# Patient Record
Sex: Male | Born: 1940 | Race: Black or African American | Hispanic: No | Smoking: Former smoker
Health system: Southern US, Community
[De-identification: ages and names within clinical notes are randomized; demographics above are authoritative.]

## PROBLEM LIST (undated history)

## (undated) ENCOUNTER — Emergency Department (HOSPITAL_COMMUNITY): Discharge status: Against Medical Advice | Payer: Medicare Other

## (undated) DIAGNOSIS — I350 Nonrheumatic aortic (valve) stenosis: Secondary | ICD-10-CM

## (undated) DIAGNOSIS — I10 Essential (primary) hypertension: Secondary | ICD-10-CM

## (undated) DIAGNOSIS — E785 Hyperlipidemia, unspecified: Secondary | ICD-10-CM

## (undated) DIAGNOSIS — N400 Enlarged prostate without lower urinary tract symptoms: Secondary | ICD-10-CM

## (undated) DIAGNOSIS — Z5189 Encounter for other specified aftercare: Secondary | ICD-10-CM

## (undated) DIAGNOSIS — I4891 Unspecified atrial fibrillation: Secondary | ICD-10-CM

## (undated) DIAGNOSIS — E119 Type 2 diabetes mellitus without complications: Secondary | ICD-10-CM

## (undated) DIAGNOSIS — I509 Heart failure, unspecified: Secondary | ICD-10-CM

## (undated) DIAGNOSIS — D649 Anemia, unspecified: Secondary | ICD-10-CM

## (undated) DIAGNOSIS — S31000A Unspecified open wound of lower back and pelvis without penetration into retroperitoneum, initial encounter: Secondary | ICD-10-CM

## (undated) DIAGNOSIS — IMO0001 Reserved for inherently not codable concepts without codable children: Secondary | ICD-10-CM

## (undated) DIAGNOSIS — R197 Diarrhea, unspecified: Secondary | ICD-10-CM

## (undated) DIAGNOSIS — Z992 Dependence on renal dialysis: Secondary | ICD-10-CM

## (undated) DIAGNOSIS — Z8719 Personal history of other diseases of the digestive system: Secondary | ICD-10-CM

## (undated) DIAGNOSIS — N2581 Secondary hyperparathyroidism of renal origin: Secondary | ICD-10-CM

## (undated) DIAGNOSIS — G4733 Obstructive sleep apnea (adult) (pediatric): Secondary | ICD-10-CM

## (undated) DIAGNOSIS — N186 End stage renal disease: Secondary | ICD-10-CM

## (undated) HISTORY — PX: OTHER SURGICAL HISTORY: SHX169

## (undated) HISTORY — PX: CHOLECYSTECTOMY: SHX55

## (undated) HISTORY — PX: EYE SURGERY: SHX253

## (undated) HISTORY — DX: Type 2 diabetes mellitus without complications: E11.9

## (undated) HISTORY — DX: Obstructive sleep apnea (adult) (pediatric): G47.33

## (undated) HISTORY — DX: Hyperlipidemia, unspecified: E78.5

## (undated) HISTORY — DX: Heart failure, unspecified: I50.9

## (undated) HISTORY — DX: Essential (primary) hypertension: I10

---

## 2003-06-15 ENCOUNTER — Ambulatory Visit (HOSPITAL_BASED_OUTPATIENT_CLINIC_OR_DEPARTMENT_OTHER): Admission: RE | Admit: 2003-06-15 | Discharge: 2003-06-15 | Payer: Self-pay | Admitting: Endocrinology

## 2004-07-09 ENCOUNTER — Inpatient Hospital Stay (HOSPITAL_COMMUNITY): Admission: EM | Admit: 2004-07-09 | Discharge: 2004-07-16 | Payer: Self-pay | Admitting: Emergency Medicine

## 2004-07-09 ENCOUNTER — Encounter: Payer: Self-pay | Admitting: Cardiology

## 2004-07-09 ENCOUNTER — Ambulatory Visit (HOSPITAL_COMMUNITY): Admission: RE | Admit: 2004-07-09 | Discharge: 2004-07-09 | Payer: Self-pay | Admitting: Endocrinology

## 2004-11-07 ENCOUNTER — Ambulatory Visit: Payer: Self-pay | Admitting: Cardiology

## 2004-11-07 ENCOUNTER — Inpatient Hospital Stay (HOSPITAL_COMMUNITY): Admission: AD | Admit: 2004-11-07 | Discharge: 2004-11-10 | Payer: Self-pay | Admitting: Endocrinology

## 2004-11-25 ENCOUNTER — Ambulatory Visit: Payer: Self-pay | Admitting: Cardiology

## 2005-02-19 ENCOUNTER — Ambulatory Visit: Payer: Self-pay | Admitting: Internal Medicine

## 2005-06-15 ENCOUNTER — Ambulatory Visit: Payer: Self-pay | Admitting: Cardiology

## 2005-06-16 ENCOUNTER — Ambulatory Visit (HOSPITAL_COMMUNITY): Admission: RE | Admit: 2005-06-16 | Discharge: 2005-06-17 | Payer: Self-pay | Admitting: Ophthalmology

## 2006-01-04 ENCOUNTER — Ambulatory Visit: Payer: Self-pay | Admitting: Cardiology

## 2006-01-13 ENCOUNTER — Encounter: Payer: Self-pay | Admitting: Cardiovascular Disease

## 2006-01-13 ENCOUNTER — Ambulatory Visit: Payer: Self-pay

## 2006-03-25 ENCOUNTER — Ambulatory Visit (HOSPITAL_COMMUNITY): Admission: RE | Admit: 2006-03-25 | Discharge: 2006-03-26 | Payer: Self-pay | Admitting: Ophthalmology

## 2006-12-02 ENCOUNTER — Ambulatory Visit (HOSPITAL_COMMUNITY): Admission: RE | Admit: 2006-12-02 | Discharge: 2006-12-03 | Payer: Self-pay | Admitting: Ophthalmology

## 2006-12-02 ENCOUNTER — Encounter (INDEPENDENT_AMBULATORY_CARE_PROVIDER_SITE_OTHER): Payer: Self-pay | Admitting: *Deleted

## 2008-01-25 ENCOUNTER — Ambulatory Visit: Payer: Self-pay | Admitting: Cardiology

## 2008-01-25 ENCOUNTER — Inpatient Hospital Stay (HOSPITAL_COMMUNITY): Admission: EM | Admit: 2008-01-25 | Discharge: 2008-02-01 | Payer: Self-pay | Admitting: Emergency Medicine

## 2008-01-26 ENCOUNTER — Encounter: Payer: Self-pay | Admitting: Cardiology

## 2008-01-26 ENCOUNTER — Ambulatory Visit: Payer: Self-pay | Admitting: Cardiovascular Disease

## 2008-01-30 ENCOUNTER — Encounter (INDEPENDENT_AMBULATORY_CARE_PROVIDER_SITE_OTHER): Payer: Self-pay | Admitting: Nephrology

## 2008-01-30 ENCOUNTER — Ambulatory Visit: Payer: Self-pay | Admitting: Vascular Surgery

## 2008-01-31 ENCOUNTER — Ambulatory Visit: Payer: Self-pay | Admitting: Vascular Surgery

## 2008-02-08 ENCOUNTER — Ambulatory Visit: Payer: Self-pay | Admitting: Cardiology

## 2008-03-05 ENCOUNTER — Ambulatory Visit: Payer: Self-pay | Admitting: Internal Medicine

## 2008-03-19 ENCOUNTER — Ambulatory Visit: Payer: Self-pay | Admitting: Surgery

## 2008-03-28 ENCOUNTER — Ambulatory Visit (HOSPITAL_COMMUNITY): Admission: RE | Admit: 2008-03-28 | Discharge: 2008-03-28 | Payer: Self-pay | Admitting: Surgery

## 2008-03-28 ENCOUNTER — Ambulatory Visit: Payer: Self-pay | Admitting: Surgery

## 2008-04-24 ENCOUNTER — Ambulatory Visit: Payer: Self-pay | Admitting: Cardiology

## 2008-05-10 ENCOUNTER — Ambulatory Visit: Payer: Self-pay | Admitting: Cardiology

## 2008-05-10 LAB — CONVERTED CEMR LAB
BUN: 110 mg/dL (ref 6–23)
Basophils Absolute: 0.2 10*3/uL — ABNORMAL HIGH (ref 0.0–0.1)
Basophils Relative: 2.1 % — ABNORMAL HIGH (ref 0.0–1.0)
CO2: 30 meq/L (ref 19–32)
Calcium: 9.3 mg/dL (ref 8.4–10.5)
Chloride: 92 meq/L — ABNORMAL LOW (ref 96–112)
Creatinine, Ser: 5.8 mg/dL (ref 0.4–1.5)
Eosinophils Absolute: 0.3 10*3/uL (ref 0.0–0.7)
Eosinophils Relative: 3.3 % (ref 0.0–5.0)
GFR calc Af Amer: 13 mL/min
GFR calc non Af Amer: 10 mL/min
Hemoglobin: 12.7 g/dL — ABNORMAL LOW (ref 13.0–17.0)
MCHC: 32.8 g/dL (ref 30.0–36.0)
MCV: 90.2 fL (ref 78.0–100.0)
Monocytes Relative: 9.4 % (ref 3.0–12.0)
Neutro Abs: 5.1 10*3/uL (ref 1.4–7.7)
Neutrophils Relative %: 64 % (ref 43.0–77.0)
Platelets: 336 10*3/uL (ref 150–400)
Potassium: 4.8 meq/L (ref 3.5–5.1)
RBC: 4.29 M/uL (ref 4.22–5.81)
Sodium: 138 meq/L (ref 135–145)
WBC: 8.1 10*3/uL (ref 4.5–10.5)

## 2008-06-05 ENCOUNTER — Ambulatory Visit: Payer: Self-pay | Admitting: Internal Medicine

## 2008-06-05 LAB — CONVERTED CEMR LAB
CO2: 32 meq/L (ref 19–32)
Calcium: 8.3 mg/dL — ABNORMAL LOW (ref 8.4–10.5)
Creatinine, Ser: 3.8 mg/dL — ABNORMAL HIGH (ref 0.4–1.5)
GFR calc Af Amer: 21 mL/min
GFR calc non Af Amer: 17 mL/min
Glucose, Bld: 235 mg/dL — ABNORMAL HIGH (ref 70–99)
Sodium: 143 meq/L (ref 135–145)

## 2008-06-21 ENCOUNTER — Ambulatory Visit: Payer: Self-pay | Admitting: Cardiology

## 2008-07-03 ENCOUNTER — Ambulatory Visit: Payer: Self-pay | Admitting: Cardiology

## 2008-07-03 LAB — CONVERTED CEMR LAB
CO2: 29 meq/L (ref 19–32)
Calcium: 8.8 mg/dL (ref 8.4–10.5)
Creatinine, Ser: 4.8 mg/dL (ref 0.4–1.5)
GFR calc Af Amer: 16 mL/min
GFR calc non Af Amer: 13 mL/min
Glucose, Bld: 198 mg/dL — ABNORMAL HIGH (ref 70–99)
Pro B Natriuretic peptide (BNP): 230 pg/mL — ABNORMAL HIGH (ref 0.0–100.0)
Sodium: 140 meq/L (ref 135–145)

## 2008-07-31 ENCOUNTER — Ambulatory Visit: Payer: Self-pay | Admitting: Cardiology

## 2008-07-31 LAB — CONVERTED CEMR LAB
BUN: 53 mg/dL — ABNORMAL HIGH (ref 6–23)
CO2: 25 meq/L (ref 19–32)
Calcium: 8.7 mg/dL (ref 8.4–10.5)
Creatinine, Ser: 3.8 mg/dL — ABNORMAL HIGH (ref 0.4–1.5)
GFR calc Af Amer: 21 mL/min
GFR calc non Af Amer: 17 mL/min
Glucose, Bld: 53 mg/dL — ABNORMAL LOW (ref 70–99)
Sodium: 145 meq/L (ref 135–145)

## 2008-08-17 ENCOUNTER — Encounter: Admission: RE | Admit: 2008-08-17 | Discharge: 2008-08-17 | Payer: Self-pay | Admitting: Nephrology

## 2008-08-29 ENCOUNTER — Ambulatory Visit (HOSPITAL_COMMUNITY): Admission: RE | Admit: 2008-08-29 | Discharge: 2008-08-29 | Payer: Self-pay | Admitting: Nephrology

## 2008-09-07 ENCOUNTER — Inpatient Hospital Stay (HOSPITAL_COMMUNITY): Admission: EM | Admit: 2008-09-07 | Discharge: 2008-09-25 | Payer: Self-pay | Admitting: Emergency Medicine

## 2008-09-07 ENCOUNTER — Ambulatory Visit: Payer: Self-pay | Admitting: Cardiology

## 2008-09-09 ENCOUNTER — Encounter (INDEPENDENT_AMBULATORY_CARE_PROVIDER_SITE_OTHER): Payer: Self-pay | Admitting: General Surgery

## 2008-10-11 ENCOUNTER — Encounter: Admission: RE | Admit: 2008-10-11 | Discharge: 2008-10-11 | Payer: Self-pay | Admitting: General Surgery

## 2008-10-18 ENCOUNTER — Ambulatory Visit: Payer: Self-pay | Admitting: Internal Medicine

## 2008-10-18 ENCOUNTER — Ambulatory Visit: Payer: Self-pay | Admitting: Pulmonary Disease

## 2008-10-18 ENCOUNTER — Inpatient Hospital Stay (HOSPITAL_COMMUNITY): Admission: EM | Admit: 2008-10-18 | Discharge: 2008-11-01 | Payer: Self-pay | Admitting: Emergency Medicine

## 2008-10-22 ENCOUNTER — Encounter (INDEPENDENT_AMBULATORY_CARE_PROVIDER_SITE_OTHER): Payer: Self-pay | Admitting: Internal Medicine

## 2008-12-12 ENCOUNTER — Ambulatory Visit: Payer: Self-pay | Admitting: Cardiology

## 2008-12-12 LAB — CONVERTED CEMR LAB
Alkaline Phosphatase: 99 units/L (ref 39–117)
Basophils Absolute: 0.1 10*3/uL (ref 0.0–0.1)
Bilirubin, Direct: 0.1 mg/dL (ref 0.0–0.3)
Eosinophils Absolute: 0.3 10*3/uL (ref 0.0–0.7)
Eosinophils Relative: 3.7 % (ref 0.0–5.0)
HCT: 41.1 % (ref 39.0–52.0)
MCHC: 34.4 g/dL (ref 30.0–36.0)
MCV: 89.5 fL (ref 78.0–100.0)
Monocytes Absolute: 1.1 10*3/uL — ABNORMAL HIGH (ref 0.1–1.0)
Neutrophils Relative %: 55.3 % (ref 43.0–77.0)
Platelets: 220 10*3/uL (ref 150–400)
RBC: 4.6 M/uL (ref 4.22–5.81)
RDW: 19.1 % — ABNORMAL HIGH (ref 11.5–14.6)
TSH: 1.05 microintl units/mL (ref 0.35–5.50)
Total Protein: 7.6 g/dL (ref 6.0–8.3)
WBC: 7.3 10*3/uL (ref 4.5–10.5)

## 2009-01-02 ENCOUNTER — Inpatient Hospital Stay (HOSPITAL_COMMUNITY): Admission: EM | Admit: 2009-01-02 | Discharge: 2009-01-05 | Payer: Self-pay | Admitting: Emergency Medicine

## 2009-01-02 ENCOUNTER — Encounter: Admission: RE | Admit: 2009-01-02 | Discharge: 2009-01-02 | Payer: Self-pay | Admitting: Nephrology

## 2009-01-03 ENCOUNTER — Encounter (INDEPENDENT_AMBULATORY_CARE_PROVIDER_SITE_OTHER): Payer: Self-pay | Admitting: Gastroenterology

## 2009-01-04 ENCOUNTER — Encounter (INDEPENDENT_AMBULATORY_CARE_PROVIDER_SITE_OTHER): Payer: Self-pay | Admitting: Gastroenterology

## 2009-01-11 ENCOUNTER — Ambulatory Visit (HOSPITAL_COMMUNITY): Admission: RE | Admit: 2009-01-11 | Discharge: 2009-01-11 | Payer: Self-pay | Admitting: Nephrology

## 2009-01-14 ENCOUNTER — Ambulatory Visit: Payer: Self-pay | Admitting: Cardiology

## 2009-01-30 ENCOUNTER — Ambulatory Visit: Payer: Self-pay | Admitting: Vascular Surgery

## 2009-02-01 ENCOUNTER — Ambulatory Visit (HOSPITAL_COMMUNITY): Admission: RE | Admit: 2009-02-01 | Discharge: 2009-02-01 | Payer: Self-pay | Admitting: Vascular Surgery

## 2009-02-01 ENCOUNTER — Ambulatory Visit: Payer: Self-pay | Admitting: Vascular Surgery

## 2009-04-15 ENCOUNTER — Ambulatory Visit (HOSPITAL_COMMUNITY): Admission: RE | Admit: 2009-04-15 | Discharge: 2009-04-15 | Payer: Self-pay | Admitting: Nephrology

## 2009-05-28 ENCOUNTER — Encounter: Payer: Self-pay | Admitting: *Deleted

## 2009-07-03 ENCOUNTER — Encounter: Payer: Self-pay | Admitting: *Deleted

## 2009-08-06 ENCOUNTER — Encounter: Payer: Self-pay | Admitting: Cardiology

## 2009-08-14 ENCOUNTER — Encounter (INDEPENDENT_AMBULATORY_CARE_PROVIDER_SITE_OTHER): Payer: Self-pay | Admitting: *Deleted

## 2009-08-22 ENCOUNTER — Telehealth (INDEPENDENT_AMBULATORY_CARE_PROVIDER_SITE_OTHER): Payer: Self-pay | Admitting: *Deleted

## 2009-09-25 ENCOUNTER — Encounter: Payer: Self-pay | Admitting: Cardiology

## 2009-09-26 ENCOUNTER — Encounter: Payer: Self-pay | Admitting: Cardiology

## 2009-10-10 ENCOUNTER — Ambulatory Visit (HOSPITAL_COMMUNITY): Admission: RE | Admit: 2009-10-10 | Discharge: 2009-10-10 | Payer: Self-pay | Admitting: Nephrology

## 2009-10-23 ENCOUNTER — Encounter: Payer: Self-pay | Admitting: Cardiology

## 2009-10-23 DIAGNOSIS — N186 End stage renal disease: Secondary | ICD-10-CM

## 2009-10-23 DIAGNOSIS — E1122 Type 2 diabetes mellitus with diabetic chronic kidney disease: Secondary | ICD-10-CM

## 2009-10-23 DIAGNOSIS — Z992 Dependence on renal dialysis: Secondary | ICD-10-CM

## 2009-10-23 DIAGNOSIS — I509 Heart failure, unspecified: Secondary | ICD-10-CM

## 2009-10-23 DIAGNOSIS — I480 Paroxysmal atrial fibrillation: Secondary | ICD-10-CM | POA: Insufficient documentation

## 2009-10-23 DIAGNOSIS — G4733 Obstructive sleep apnea (adult) (pediatric): Secondary | ICD-10-CM

## 2009-10-23 DIAGNOSIS — I120 Hypertensive chronic kidney disease with stage 5 chronic kidney disease or end stage renal disease: Secondary | ICD-10-CM

## 2009-10-24 ENCOUNTER — Ambulatory Visit: Payer: Self-pay | Admitting: Cardiology

## 2009-10-24 DIAGNOSIS — I10 Essential (primary) hypertension: Secondary | ICD-10-CM

## 2009-10-28 ENCOUNTER — Telehealth (INDEPENDENT_AMBULATORY_CARE_PROVIDER_SITE_OTHER): Payer: Self-pay | Admitting: *Deleted

## 2009-10-30 ENCOUNTER — Telehealth (INDEPENDENT_AMBULATORY_CARE_PROVIDER_SITE_OTHER): Payer: Self-pay

## 2009-10-31 ENCOUNTER — Encounter (HOSPITAL_COMMUNITY): Admission: RE | Admit: 2009-10-31 | Discharge: 2009-12-25 | Payer: Self-pay | Admitting: Cardiology

## 2009-10-31 ENCOUNTER — Ambulatory Visit: Payer: Self-pay | Admitting: Internal Medicine

## 2009-10-31 ENCOUNTER — Ambulatory Visit: Payer: Self-pay

## 2009-11-07 LAB — CONVERTED CEMR LAB
ALT: 26 units/L (ref 0–53)
Alkaline Phosphatase: 109 units/L (ref 39–117)
Bilirubin, Direct: 0 mg/dL (ref 0.0–0.3)
TSH: 0.99 microintl units/mL (ref 0.35–5.50)
Total Bilirubin: 1.7 mg/dL — ABNORMAL HIGH (ref 0.3–1.2)
Total Protein: 8.4 g/dL — ABNORMAL HIGH (ref 6.0–8.3)

## 2010-01-15 ENCOUNTER — Telehealth (INDEPENDENT_AMBULATORY_CARE_PROVIDER_SITE_OTHER): Payer: Self-pay | Admitting: *Deleted

## 2010-01-16 ENCOUNTER — Ambulatory Visit (HOSPITAL_COMMUNITY): Admission: RE | Admit: 2010-01-16 | Discharge: 2010-01-16 | Payer: Self-pay | Admitting: Nephrology

## 2010-04-26 IMAGING — CT CT PELVIS W/O CM
2 of 4 series · 17 of 46 positions shown, 19 images · non-contrast
Comparison: 09/15/2008

CT ABDOMEN

CLINICAL DATA: Biloma drain

CT ABDOMEN AND PELVIS WITHOUT CONTRAST
TECHNIQUE: Multidetector CT imaging of the abdomen and pelvis was
performed following the standard
protocol without intravenous contrast.

[Series 2: abd/pelv w/o 5.0 b31f st · axial · non-contrast · 0.79mm/px · z∈[-480,-56]mm · 14 of 93 slices shown, 16 images]
[im 4/93  soft-tissue]
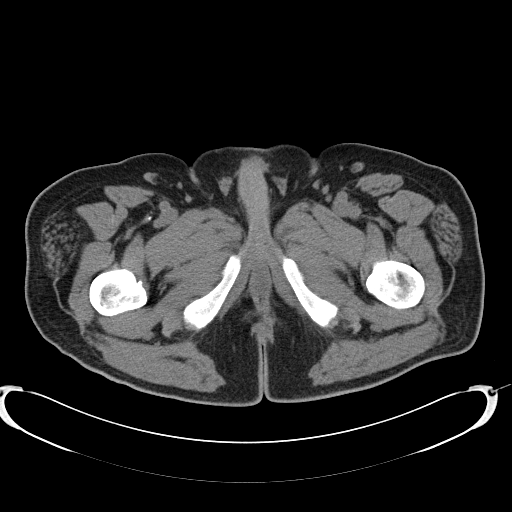
[im 4/93  bone]
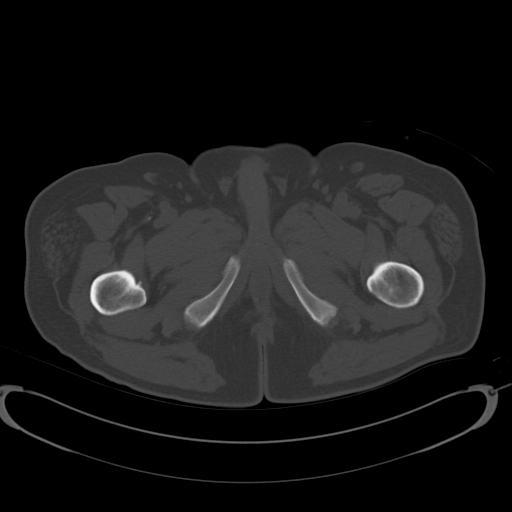
[im 12/93  soft-tissue]
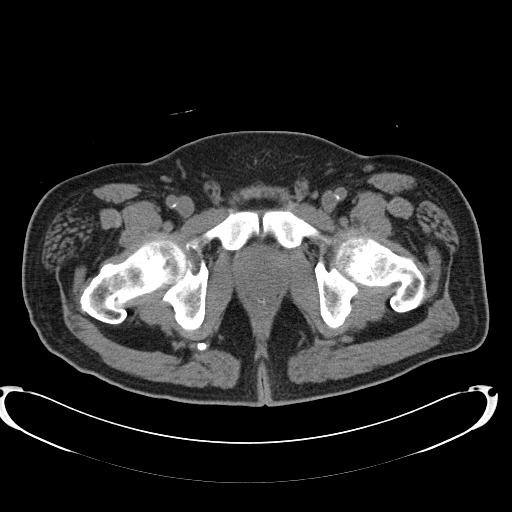
[im 19/93  soft-tissue]
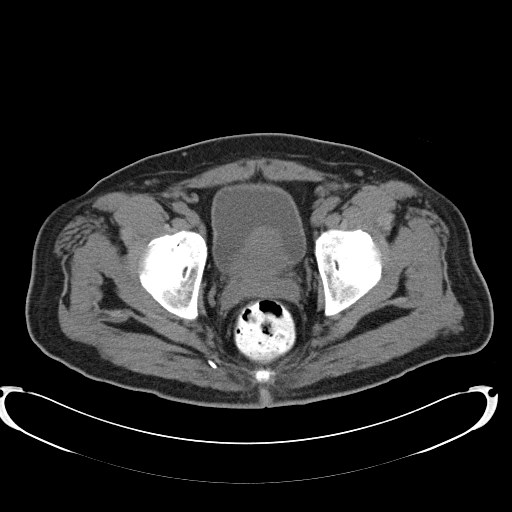
[im 26/93  soft-tissue]
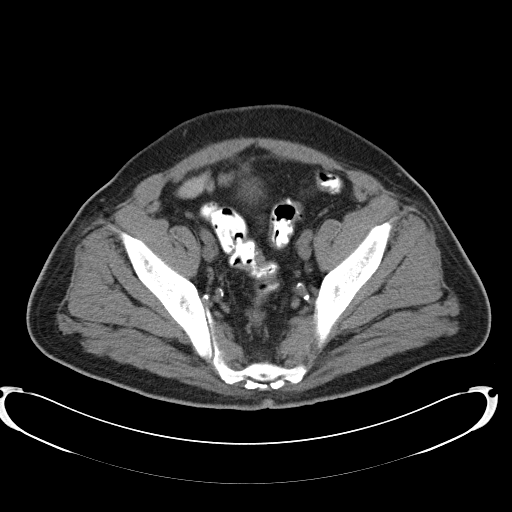
[im 30/93  soft-tissue]
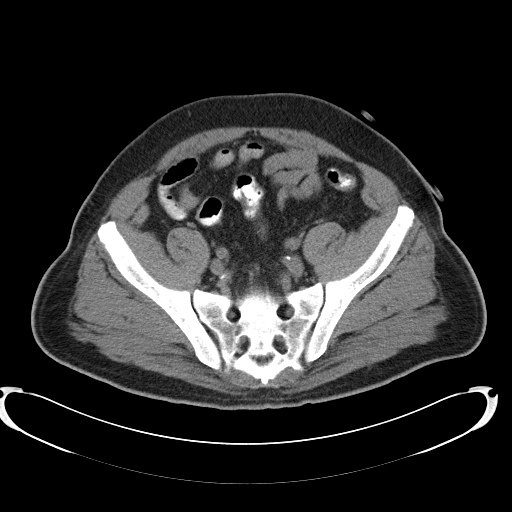
[im 37/93  soft-tissue]
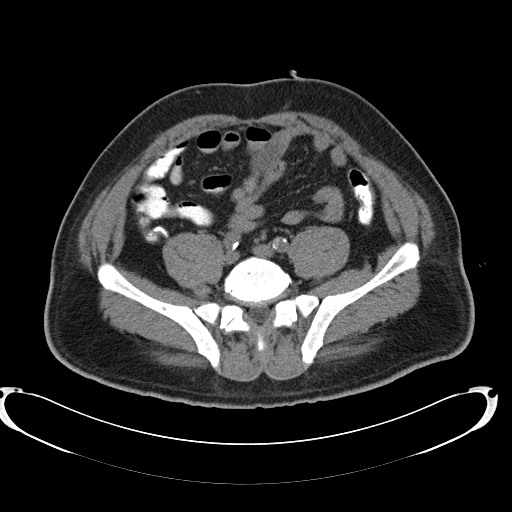
[im 45/93  soft-tissue]
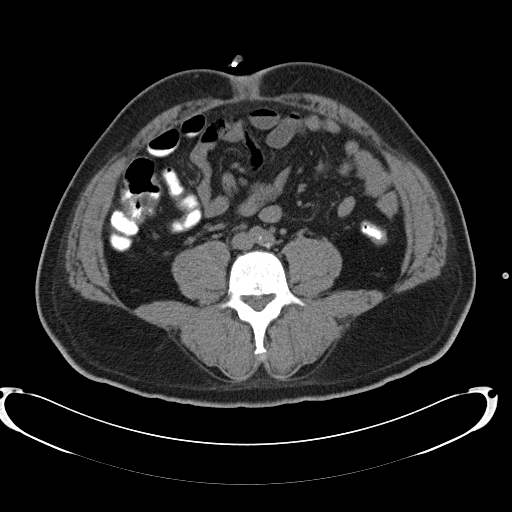
[im 48/93  soft-tissue]
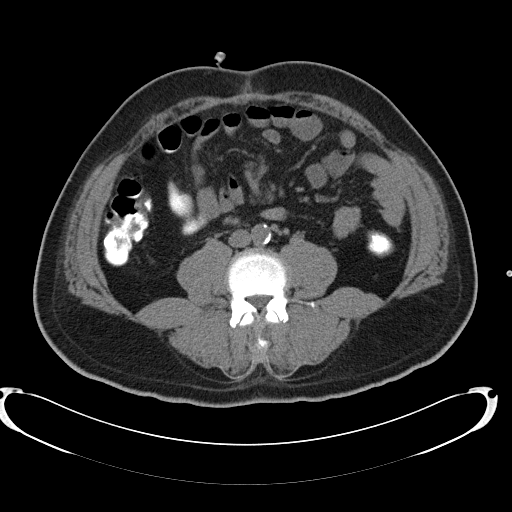
[im 56/93  soft-tissue]
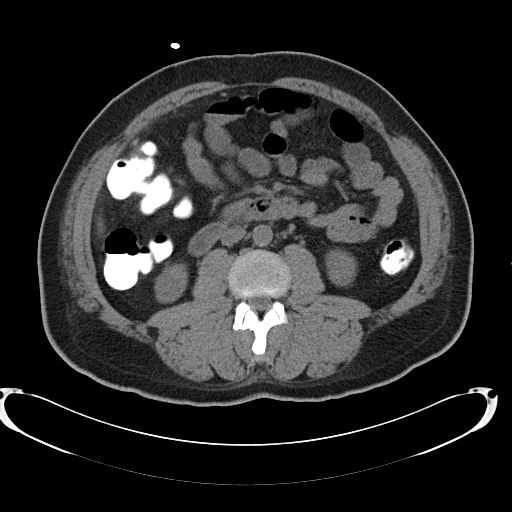
[im 56/93  bone]
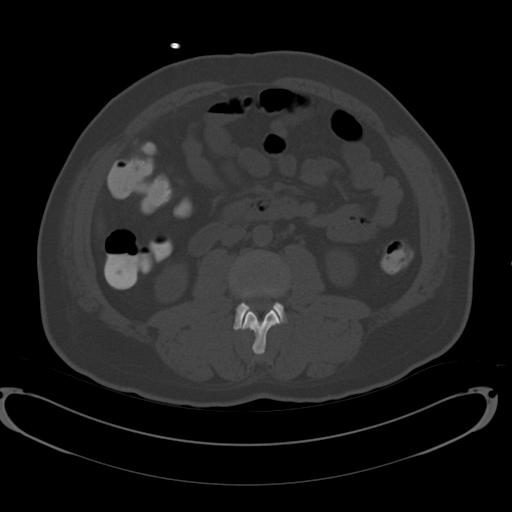
[im 63/93  soft-tissue]
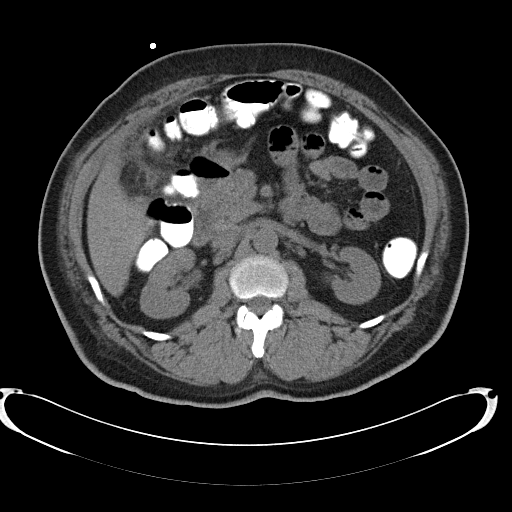
[im 70/93  soft-tissue]
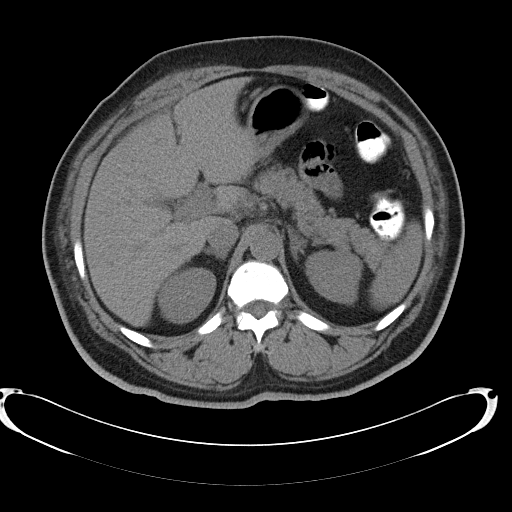
[im 74/93  soft-tissue]
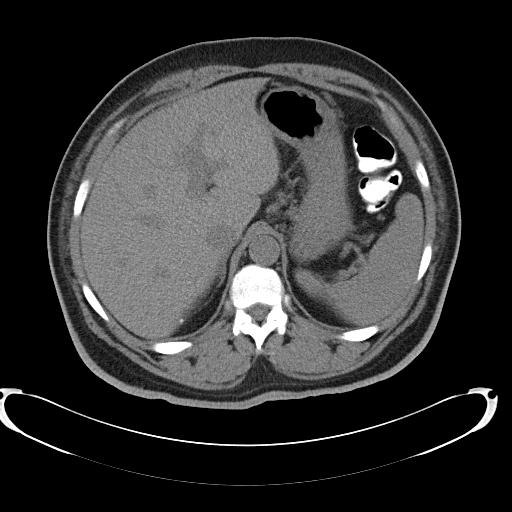
[im 81/93  soft-tissue]
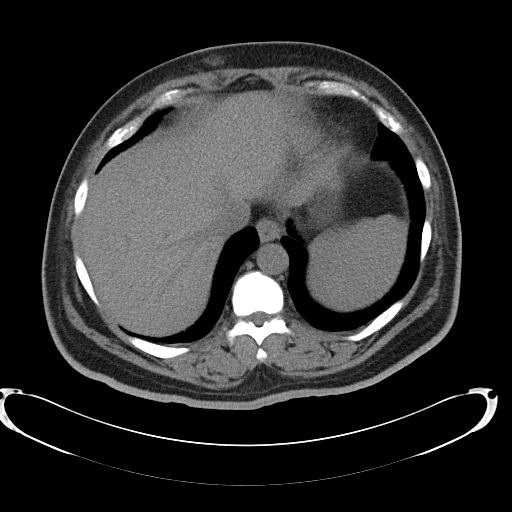
[im 89/93  soft-tissue]
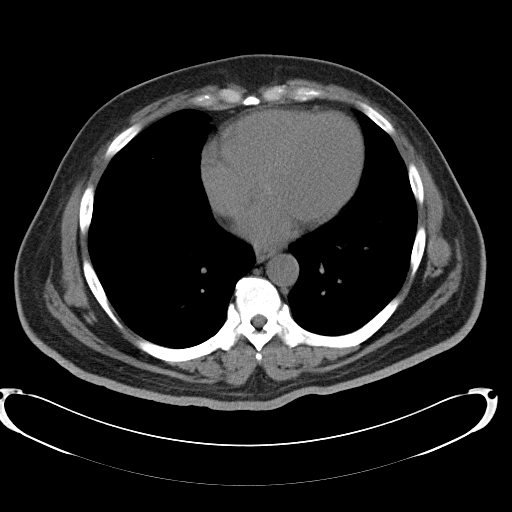

[Series 5: abd/pelv w/o 3.0 spo cor thins · coronal · non-contrast · 1.04mm/px · 3 of 77 slices shown]
[im 26/77  soft-tissue]
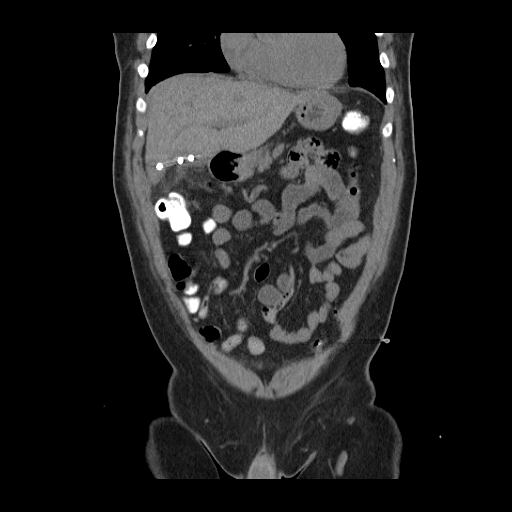
[im 34/77  soft-tissue]
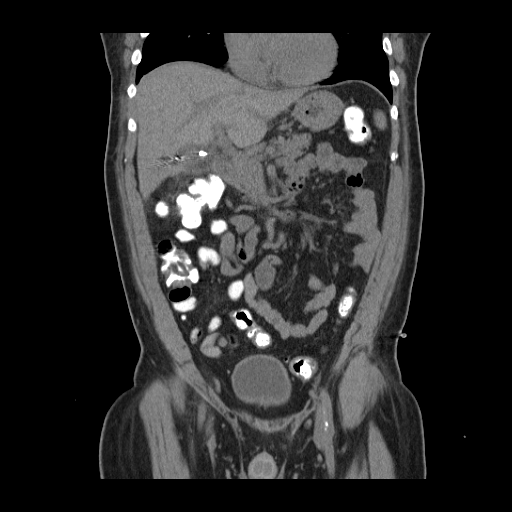
[im 43/77  soft-tissue]
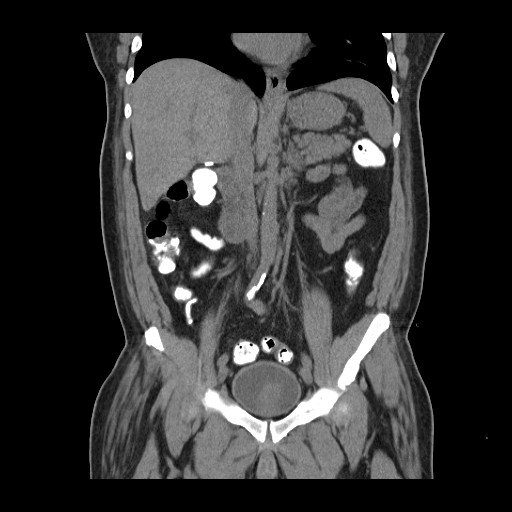

[17 of 46 positions shown; findings below may reference images not displayed]

FINDINGS: A pigtail drain is now present in the gallbladder fossa.
The fluid collection has completely resolved.  No new fluid
collection is present.

The kidneys, spleen, pancreas, adrenal glands are stable.  Negative
abnormal adenopathy.
IMPRESSION: Interval placement of a drain in the gallbladder fossa with
resolution of the fluid collection.

CT PELVIS
FINDINGS: The prostate remains enlarged compared to September 07, 2008.  The bladder is stable in appearance.  Negative free fluid.
Negative abnormal adenopathy.
IMPRESSION: Stable.

## 2010-10-09 ENCOUNTER — Ambulatory Visit (HOSPITAL_COMMUNITY): Admission: RE | Admit: 2010-10-09 | Discharge: 2010-10-09 | Payer: Self-pay | Admitting: Nephrology

## 2010-12-16 ENCOUNTER — Encounter
Admission: RE | Admit: 2010-12-16 | Discharge: 2010-12-16 | Payer: Self-pay | Source: Home / Self Care | Attending: Neurology | Admitting: Neurology

## 2011-01-17 ENCOUNTER — Encounter: Payer: Self-pay | Admitting: Nephrology

## 2011-01-18 ENCOUNTER — Encounter: Payer: Self-pay | Admitting: Neurology

## 2011-01-19 ENCOUNTER — Encounter: Payer: Self-pay | Admitting: Nephrology

## 2011-01-29 ENCOUNTER — Ambulatory Visit: Admit: 2011-01-29 | Payer: Self-pay | Attending: Vascular Surgery | Admitting: Vascular Surgery

## 2011-01-29 ENCOUNTER — Encounter (INDEPENDENT_AMBULATORY_CARE_PROVIDER_SITE_OTHER): Payer: Medicare Other | Admitting: Vascular Surgery

## 2011-01-29 ENCOUNTER — Other Ambulatory Visit (INDEPENDENT_AMBULATORY_CARE_PROVIDER_SITE_OTHER): Payer: Medicare Other

## 2011-01-29 DIAGNOSIS — R0989 Other specified symptoms and signs involving the circulatory and respiratory systems: Secondary | ICD-10-CM

## 2011-01-29 DIAGNOSIS — I6529 Occlusion and stenosis of unspecified carotid artery: Secondary | ICD-10-CM

## 2011-01-29 NOTE — Letter (Signed)
Summary: [facility] Transplant Evaluation   [facility] Transplant Evaluation   Imported By: Sallee Provencal 01/07/2010 11:36:47  _____________________________________________________________________  External Attachment:    Type:   Image     Comment:   External Document

## 2011-01-29 NOTE — Progress Notes (Signed)
   Phone Note Other Incoming   Caller: Beth Details for Reason: Pt.Information Initial call taken by: Conni Elliot lead over to [facility] Transplant Dept. to fax [phone] [facility]  January 15, 2010 10:17 AM

## 2011-02-06 NOTE — Procedures (Unsigned)
CAROTID DUPLEX EXAM  INDICATION:  Right bruit.  HISTORY: Diabetes:  Yes Cardiac:  Atrial fibrillation, diastolic dysfunction, CHF Hypertension:  Yes Smoking:  No Previous Surgery:  No CV History:  History of CVA Amaurosis Fugax No, Paresthesias No, Hemiparesis No                                      RIGHT             LEFT Brachial systolic pressure: Brachial Doppler waveforms: Vertebral direction of flow:        Antegrade         Antegrade DUPLEX VELOCITIES (cm/sec) CCA peak systolic                   69                65 ECA peak systolic                   101               69 ICA peak systolic                   59                89 ICA end diastolic                   12                28 PLAQUE MORPHOLOGY:                  Intimal wall thickening             Intimal wall thickening PLAQUE AMOUNT:                      Minimal           Minimal PLAQUE LOCATION:                    CCA               CCA, bifurcation  IMPRESSION: 1. Bilateral internal carotid arteries are within normal limits. 2. Antegrade vertebral arteries bilaterally.        ___________________________________________ Jessy Oto Oneida Alar, MD  EM/MEDQ  D:  01/29/2011  T:  01/29/2011  Job:  [id number]

## 2011-02-09 NOTE — Assessment & Plan Note (Signed)
OFFICE VISIT  [name], TOUPS Edward DOB:  1941/11/04                                       01/29/2011 [id number]  CHIEF COMPLAINT:  Carotid bruit.  HISTORY OF PRESENT ILLNESS:  The patient is a 70 year old male referred by Dr. Mercy Moore for evaluation of carotid bruit.  The patient apparently has had some memory loss in the last year or so although the patient states he was only confused once in the last year.  He apparently was noted to have a carotid bruit on recent office exam by Dr. Mercy Moore and was referred for further evaluation for possible carotid origin of his memory problems.  He denies prior symptoms of TIA, amaurosis or stroke.  CHRONIC MEDICAL PROBLEMS:  Include end-stage renal disease, hypertension, diabetes and elevated cholesterol.  These are all currently controlled and followed by Dr. Mercy Moore.  Of note, the patient also had a recent fall and complained today of some right foot and left calf pain but he states that overall this is improving.  He was using a cane up until yesterday but states he has been able to walk better today.  PAST MEDICAL HISTORY:  As listed above.  SOCIAL HISTORY:  He is single.  He has 3 children.  He is a nonsmoker and quit in 2002.  He consumes 3 to 4 beers daily.  FAMILY HISTORY:  Remarkable for his son who has diabetes and hypertension.  REVIEW OF SYSTEMS:  Full 12 point review of systems was performed with the patient today.  These were all negative.  MEDICATIONS:  Include Ambien, metoprolol, Lipitor, PhosLo, amiodarone, Prandin, Nephro-Vite and aspirin 81 mg once a day.  ALLERGIES:  He has no known drug allergies.  PHYSICAL EXAM:  Vital signs:  Blood pressure is 148/81 in the right arm, heart rate 69 and regular.  HEENT:  Unremarkable.  Neck:  Has 2+ carotid pulses.  I am unable to appreciate a bruit today.  Chest:  Clear to auscultation.  Cardiac:  Regular rate and rhythm with a 3/6  systolic murmur heard primarily in the right second interspace.  Peripheral vascular exam:  He has an audible bruit and palpable thrill in the left forearm AV graft.  He has 2+ femoral pulses.  He has absent popliteal and pedal pulses bilaterally.  Both feet are pink, warm and well- perfused.  Skin:  Has no open ulcers or rashes.  Neurologic:  Shows symmetric upper extremity and lower extremity motor strength which is 5/5.  Musculoskeletal:  Shows no major obvious joint deformities. Abdomen:  Soft, nontender, nondistended.  No masses.  He had a carotid duplex exam performed today which showed no significant carotid stenosis bilaterally.  He also had antegrade vertebral flow bilaterally.  In summary, the patient has a history of a right carotid bruit but has no significant flow limiting stenosis on carotid duplex exam.  I do not believe further intervention is warranted as far as that is concerned. 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  I would classify this as mildly symptomatic if at all and most likely his symptoms currently are due to his fall.  He will follow up with [place]  on an as-needed basis.    Jessy Oto. Fields, MD Electronically Signed  CEF/MEDQ  D:  01/29/2011  Edward:  01/30/2011  Job:  [id number]  cc:   Windy Kalata, M.D.

## 2011-02-11 ENCOUNTER — Other Ambulatory Visit (HOSPITAL_COMMUNITY): Payer: Self-pay | Admitting: Nephrology

## 2011-02-11 DIAGNOSIS — N186 End stage renal disease: Secondary | ICD-10-CM

## 2011-02-17 ENCOUNTER — Other Ambulatory Visit (HOSPITAL_COMMUNITY): Payer: Medicare Other

## 2011-02-17 ENCOUNTER — Ambulatory Visit (HOSPITAL_COMMUNITY)
Admission: RE | Admit: 2011-02-17 | Discharge: 2011-02-17 | Disposition: A | Discharge status: Home / Self Care | Payer: Medicare Other | Source: Ambulatory Visit | Attending: Nephrology | Admitting: Nephrology

## 2011-02-17 ENCOUNTER — Other Ambulatory Visit (HOSPITAL_COMMUNITY): Payer: Self-pay | Admitting: Nephrology

## 2011-02-17 DIAGNOSIS — D649 Anemia, unspecified: Secondary | ICD-10-CM | POA: Insufficient documentation

## 2011-02-17 DIAGNOSIS — N186 End stage renal disease: Secondary | ICD-10-CM

## 2011-02-17 DIAGNOSIS — T82898A Other specified complication of vascular prosthetic devices, implants and grafts, initial encounter: Secondary | ICD-10-CM | POA: Insufficient documentation

## 2011-02-17 DIAGNOSIS — I4891 Unspecified atrial fibrillation: Secondary | ICD-10-CM | POA: Insufficient documentation

## 2011-02-17 DIAGNOSIS — Y849 Medical procedure, unspecified as the cause of abnormal reaction of the patient, or of later complication, without mention of misadventure at the time of the procedure: Secondary | ICD-10-CM | POA: Insufficient documentation

## 2011-02-17 DIAGNOSIS — E119 Type 2 diabetes mellitus without complications: Secondary | ICD-10-CM | POA: Insufficient documentation

## 2011-02-17 DIAGNOSIS — I509 Heart failure, unspecified: Secondary | ICD-10-CM | POA: Insufficient documentation

## 2011-02-17 MED ORDER — IOHEXOL 300 MG/ML  SOLN
80.0000 mL | INTRAMUSCULAR | Status: DC | PRN
  Administered 2011-02-17: 80 mL via INTRAVENOUS

## 2011-02-27 ENCOUNTER — Inpatient Hospital Stay (HOSPITAL_COMMUNITY)
Admission: EM | Admit: 2011-02-27 | Discharge: 2011-03-03 | DRG: 313 | Disposition: A | Discharge status: Home / Self Care | Payer: Medicare Other | Attending: Internal Medicine | Admitting: Internal Medicine

## 2011-02-27 ENCOUNTER — Emergency Department (HOSPITAL_COMMUNITY): Payer: Medicare Other

## 2011-02-27 DIAGNOSIS — N2581 Secondary hyperparathyroidism of renal origin: Secondary | ICD-10-CM | POA: Diagnosis present

## 2011-02-27 DIAGNOSIS — D72829 Elevated white blood cell count, unspecified: Secondary | ICD-10-CM | POA: Diagnosis present

## 2011-02-27 DIAGNOSIS — E785 Hyperlipidemia, unspecified: Secondary | ICD-10-CM | POA: Diagnosis present

## 2011-02-27 DIAGNOSIS — I4891 Unspecified atrial fibrillation: Secondary | ICD-10-CM | POA: Diagnosis present

## 2011-02-27 DIAGNOSIS — Z992 Dependence on renal dialysis: Secondary | ICD-10-CM

## 2011-02-27 DIAGNOSIS — I959 Hypotension, unspecified: Secondary | ICD-10-CM | POA: Diagnosis present

## 2011-02-27 DIAGNOSIS — I5032 Chronic diastolic (congestive) heart failure: Secondary | ICD-10-CM | POA: Diagnosis present

## 2011-02-27 DIAGNOSIS — Z7901 Long term (current) use of anticoagulants: Secondary | ICD-10-CM

## 2011-02-27 DIAGNOSIS — N186 End stage renal disease: Secondary | ICD-10-CM | POA: Diagnosis present

## 2011-02-27 DIAGNOSIS — R0789 Other chest pain: Principal | ICD-10-CM | POA: Diagnosis present

## 2011-02-27 DIAGNOSIS — I509 Heart failure, unspecified: Secondary | ICD-10-CM | POA: Diagnosis present

## 2011-02-27 DIAGNOSIS — D638 Anemia in other chronic diseases classified elsewhere: Secondary | ICD-10-CM | POA: Diagnosis present

## 2011-02-27 DIAGNOSIS — E119 Type 2 diabetes mellitus without complications: Secondary | ICD-10-CM | POA: Diagnosis present

## 2011-02-27 DIAGNOSIS — I120 Hypertensive chronic kidney disease with stage 5 chronic kidney disease or end stage renal disease: Secondary | ICD-10-CM | POA: Diagnosis present

## 2011-02-27 LAB — CBC
HCT: 42.2 % (ref 39.0–52.0)
Hemoglobin: 14.6 g/dL (ref 13.0–17.0)
MCH: 32.4 pg (ref 26.0–34.0)
MCHC: 34.6 g/dL (ref 30.0–36.0)
MCV: 93.8 fL (ref 78.0–100.0)
Platelets: 160 10*3/uL (ref 150–400)
RBC: 4.5 MIL/uL (ref 4.22–5.81)
WBC: 10.9 10*3/uL — ABNORMAL HIGH (ref 4.0–10.5)

## 2011-02-27 LAB — DIFFERENTIAL
Eosinophils Absolute: 0.5 10*3/uL (ref 0.0–0.7)
Lymphocytes Relative: 12 % (ref 12–46)
Lymphs Abs: 1.3 10*3/uL (ref 0.7–4.0)
Monocytes Absolute: 0.7 10*3/uL (ref 0.1–1.0)
Monocytes Relative: 6 % (ref 3–12)
Neutro Abs: 8.4 10*3/uL — ABNORMAL HIGH (ref 1.7–7.7)
Neutrophils Relative %: 77 % (ref 43–77)

## 2011-02-27 LAB — CK TOTAL AND CKMB (NOT AT ARMC)
CK, MB: 4.1 ng/mL — ABNORMAL HIGH (ref 0.3–4.0)
Relative Index: 1.3 (ref 0.0–2.5)
Total CK: 327 U/L — ABNORMAL HIGH (ref 7–232)

## 2011-02-27 LAB — GLUCOSE, CAPILLARY: Glucose-Capillary: 218 mg/dL — ABNORMAL HIGH (ref 70–99)

## 2011-02-27 LAB — COMPREHENSIVE METABOLIC PANEL
ALT: 24 U/L (ref 0–53)
AST: 20 U/L (ref 0–37)
Albumin: 4.2 g/dL (ref 3.5–5.2)
BUN: 20 mg/dL (ref 6–23)
CO2: 31 mEq/L (ref 19–32)
Calcium: 9.8 mg/dL (ref 8.4–10.5)
Creatinine, Ser: 4.6 mg/dL — ABNORMAL HIGH (ref 0.4–1.5)
Total Protein: 8.6 g/dL — ABNORMAL HIGH (ref 6.0–8.3)

## 2011-02-27 LAB — BRAIN NATRIURETIC PEPTIDE: Pro B Natriuretic peptide (BNP): <30 pg/mL (ref 0.0–100.0)

## 2011-02-27 LAB — POCT CARDIAC MARKERS
CKMB, poc: 4.6 ng/mL (ref 1.0–8.0)
Myoglobin, poc: >500 ng/mL (ref 12–200)
Troponin i, poc: <0.05 ng/mL (ref 0.00–0.09)

## 2011-02-27 LAB — APTT: aPTT: 32 seconds (ref 24–37)

## 2011-02-28 LAB — CBC
HCT: 38.2 % — ABNORMAL LOW (ref 39.0–52.0)
HCT: 35.9 % — ABNORMAL LOW (ref 39.0–52.0)
Hemoglobin: 11.6 g/dL — ABNORMAL LOW (ref 13.0–17.0)
MCH: 30.9 pg (ref 26.0–34.0)
MCH: 30.6 pg (ref 26.0–34.0)
MCHC: 32.7 g/dL (ref 30.0–36.0)
MCHC: 32.3 g/dL (ref 30.0–36.0)
MCV: 94.6 fL (ref 78.0–100.0)
Platelets: 160 10*3/uL (ref 150–400)
Platelets: 146 10*3/uL — ABNORMAL LOW (ref 150–400)
RDW: 14.3 % (ref 11.5–15.5)
RDW: 14.4 % (ref 11.5–15.5)
WBC: 17.8 10*3/uL — ABNORMAL HIGH (ref 4.0–10.5)
WBC: 16.2 10*3/uL — ABNORMAL HIGH (ref 4.0–10.5)

## 2011-02-28 LAB — COMPREHENSIVE METABOLIC PANEL
ALT: 22 U/L (ref 0–53)
AST: 21 U/L (ref 0–37)
AST: 16 U/L (ref 0–37)
Albumin: 3.7 g/dL (ref 3.5–5.2)
Albumin: 3.5 g/dL (ref 3.5–5.2)
Alkaline Phosphatase: 136 U/L — ABNORMAL HIGH (ref 39–117)
Alkaline Phosphatase: 126 U/L — ABNORMAL HIGH (ref 39–117)
CO2: 29 mEq/L (ref 19–32)
Calcium: 9.3 mg/dL (ref 8.4–10.5)
Calcium: 8.9 mg/dL (ref 8.4–10.5)
Chloride: 93 mEq/L — ABNORMAL LOW (ref 96–112)
Chloride: 92 mEq/L — ABNORMAL LOW (ref 96–112)
Creatinine, Ser: 6.31 mg/dL — ABNORMAL HIGH (ref 0.4–1.5)
GFR calc Af Amer: 12 mL/min — ABNORMAL LOW (ref >60)
GFR calc non Af Amer: 10 mL/min — ABNORMAL LOW (ref >60)
Glucose, Bld: 231 mg/dL — ABNORMAL HIGH (ref 70–99)
Potassium: 4.1 mEq/L (ref 3.5–5.1)
Sodium: 138 mEq/L (ref 135–145)
Sodium: 135 mEq/L (ref 135–145)
Total Protein: 7.4 g/dL (ref 6.0–8.3)
Total Protein: 7.4 g/dL (ref 6.0–8.3)

## 2011-02-28 LAB — DIFFERENTIAL
Basophils Relative: 0 % (ref 0–1)
Eosinophils Relative: 1 % (ref 0–5)
Monocytes Absolute: 0.9 10*3/uL (ref 0.1–1.0)
Monocytes Relative: 5 % (ref 3–12)
Neutro Abs: 15.3 10*3/uL — ABNORMAL HIGH (ref 1.7–7.7)

## 2011-02-28 LAB — LIPID PANEL
HDL: 33 mg/dL — ABNORMAL LOW (ref >39)
LDL Cholesterol: 113 mg/dL — ABNORMAL HIGH (ref 0–99)
Total CHOL/HDL Ratio: 6.2 RATIO
Triglycerides: 285 mg/dL — ABNORMAL HIGH (ref <150)
VLDL: 57 mg/dL — ABNORMAL HIGH (ref 0–40)

## 2011-02-28 LAB — MAGNESIUM
Magnesium: 2.2 mg/dL (ref 1.5–2.5)
Magnesium: 2.1 mg/dL (ref 1.5–2.5)

## 2011-02-28 LAB — GLUCOSE, CAPILLARY
Glucose-Capillary: 218 mg/dL — ABNORMAL HIGH (ref 70–99)
Glucose-Capillary: 167 mg/dL — ABNORMAL HIGH (ref 70–99)
Glucose-Capillary: 124 mg/dL — ABNORMAL HIGH (ref 70–99)
Glucose-Capillary: 158 mg/dL — ABNORMAL HIGH (ref 70–99)

## 2011-02-28 LAB — CARDIAC PANEL(CRET KIN+CKTOT+MB+TROPI)
CK, MB: 3.5 ng/mL (ref 0.3–4.0)
CK, MB: 4.3 ng/mL — ABNORMAL HIGH (ref 0.3–4.0)
Total CK: 242 U/L — ABNORMAL HIGH (ref 7–232)
Total CK: 305 U/L — ABNORMAL HIGH (ref 7–232)

## 2011-02-28 LAB — HEMOGLOBIN A1C
Hgb A1c MFr Bld: 6.1 % — ABNORMAL HIGH (ref <5.7)
Mean Plasma Glucose: 128 mg/dL — ABNORMAL HIGH (ref <117)

## 2011-03-01 LAB — GLUCOSE, CAPILLARY
Glucose-Capillary: 171 mg/dL — ABNORMAL HIGH (ref 70–99)
Glucose-Capillary: 202 mg/dL — ABNORMAL HIGH (ref 70–99)
Glucose-Capillary: 152 mg/dL — ABNORMAL HIGH (ref 70–99)

## 2011-03-01 NOTE — H&P (Signed)
[name], [name]               ACCOUNT NO.:  [id number]  MEDICAL RECORD NO.:  [id number]           PATIENT TYPE:  E  LOCATION:  [facility]                         FACILITY:  [facility]  PHYSICIAN:  Farris Has, MDDATE OF BIRTH:  10/13/1941  DATE OF ADMISSION:  02/27/2011 DATE OF DISCHARGE:                             HISTORY & PHYSICAL   PRIMARY CARE PROVIDER:  Elayne Snare, MD  [facility]:  Windy Kalata, MD  CHIEF COMPLAINT:  Chest pain, shortness of breath, and nausea.  The patient was at his dialysis which he receives on Monday, Wednesday, Friday.  In the end of dialysis, he developed shortness of breath, nausea, vomited once and then developed 1-minute long chest pain above 1 inch patch on his left chest, that has self resolved and now he is currently back to his baseline, but given his risk factors, he was brought into the emergency department for further evaluation.  The patient states that he usually does not get chest pains with exertion, but does get some shortness of breath carries a diagnosis of diastolic dysfunction.  He never had a cardiac catheterization per patient, but I cannot discern that he had a stress test in November 2009, which was Myoview which was negative.  His cardiologist is Dr. Olevia Perches.  Besides above-mentioned complaints, he has not had any other recent complaints. No fevers.  No chills.  No diarrhea.  No constipation.  REVIEW OF SYSTEMS:  Otherwise negative.  PAST MEDICAL HISTORY: 1. Hypertension. 2. Diabetes. 3. Hyperlipidemia. 4. End-stage renal disease, on hemodialysis Monday, Wednesday, Friday. 5. Gout. 6. History of diastolic heart failure. 7. History of atrial fibrillation. 8. The patient is status post graft placement to his left forearm. 9. The patient does state that he ran out of his medications for the     past 2 weeks.  Please also note when the patient first presented to     the emergency department,  blood pressure was A999333 systolic.  SOCIAL HISTORY:  The patient is a former smoker, quit is 2005.  Does not drink or abuse drugs.  Currently, lives alone at home.  FAMILY HISTORY:  Noncontributory.  ALLERGIES:  None.  MEDICATIONS: 1. Ambien 5 mg daily. 2. Metoprolol 25 mg b.i.d. 3. Lipitor 40 mg daily. 4. PhosLo 667 mg three tablets t.i.d. 5. Neurontin 100 mg daily. 6. Prandin 0.5 mg b.i.d. 7. Nephro-Vite one tablet daily. 8. Aspirin 81 mg daily.  The patient also receives Epogen and Zemplar     with his dialysis. 9. Zemplar 5 mcg three times a week and Epogen 1400 units three times     a week as well.  PHYSICAL EXAMINATION:  VITAL SIGNS:  Temperature 97.8, blood pressure 141/78, pulse 90, respiration 18, satting 95% on room air. GENERAL:  The patient appears to be in no acute distress. HEENT:  Head nontraumatic.  Moist mucous membranes. LUNGS:  Clear to auscultation bilaterally. HEART:  Regular rate and rhythm.  No murmurs appreciated. ABDOMEN:  Slightly obese, but nontender and nondistended. LOWER EXTREMITIES:  Without clubbing, cyanosis or edema. SKIN:  Dry. NEUROLOGIC:  Grossly intact.  LABORATORY  DATA:  White blood cell count 10.9, hemoglobin 14.6.  Sodium 139, potassium 3.5, creatinine 4.6, blood sugar 160.  Total CK 3.29, CK- MB 4.1, troponin 0.04.  BNP less than 30.  He had a normal Myoview done in 2009.  An EKG showing currently normal sinus rhythm with no evidence of ischemic changes compared to prior.  ASSESSMENT AND PLAN: 1. Chest pain, sounds somewhat atypical, but given his extensive risk     factors, we will admit for further evaluation.  We will cycle     cardiac enzymes, repeat serial EKG, repeat an echo that could     consider Cardiology consult.  He may benefit from repeat stress     testing given his history of diabetes and end-stage renal disease     and given that he does to poor shortness of breath with exertion. 2. Diabetes mellitus, continue  Prandin and put on sliding scale. 3. End-stage renal disease.  His next dialysis is due on Monday.     Please let [facility] know about the patient being here over the     weekend. 4. Hypertension.  The patient had elevated blood pressure when he     first presented, this currently improved.  He was put on a nitro     patch.  His latest blood pressure is 138/83, though we will restart     his home meds which he     has skipped for past 2 weeks.  We will discontinue nitro patch and     see how he does. 5. Prophylaxis, Protonix and heparin subcu. 6. Code status.  The patient wished to be full code.     Farris Has, MD     AVD/MEDQ  D:  02/27/2011  T:  02/27/2011  Job:  [id number]  cc:   Elayne Snare, M.D. Windy Kalata, M.D.  Electronically Signed by Toy Baker MD on 03/01/2011 10:13:06 PM

## 2011-03-01 NOTE — Consult Note (Signed)
[name], [name]               ACCOUNT NO.:  [id number]  MEDICAL RECORD NO.:  [id number]           PATIENT TYPE:  I  LOCATION:  [id number]                         FACILITY:  [facility]  PHYSICIAN:  Fransico Him, M.D.     DATE OF BIRTH:  22-Apr-1941  DATE OF CONSULTATION:  02/28/2011 DATE OF DISCHARGE:                                CONSULTATION   REFERRING PHYSICIAN:  [facility].  PRIMARY CARDIOLOGIST:  Vanna Scotland. Olevia Perches, MD, [facility]  CHIEF COMPLAINT:  Chest pain and shortness of breath.  HISTORY OF PRESENT ILLNESS:  This is a 70 year old African American male with a history of diastolic heart failure, hypertension, paroxysmal atrial fibrillation, end-stage renal disease who was in hemodialysis yesterday and developed shortness of breath, nausea, one episode of vomiting, and then had a chest pressure that lasted a minute on his left side of his chest and resolved spontaneously.  He has not had any further symptoms since then.  He has had a similar episode in the past which was back in 2009, at which time, he had a stress Myoview which showed no ischemia.  We are now asked to consult.  Of note, his systolic blood pressure was 190 mmHg on arrival in the emergency room and he had run out of his meds 2 weeks ago.  PAST MEDICAL HISTORY: 1. Hypertension. 2. Diabetes. 3. Dyslipidemia. 4. End-stage renal disease, on hemodialysis. 5. Gout. 6. Diastolic heart failure. 7. Paroxysmal atrial fibrillation.  PAST SURGICAL HISTORY:  Status post AV fistula placed on left forearm.  SOCIAL HISTORY:  He is a former smoker and quit in 2005.  He denies any alcohol or IV drug use.  He lives at home.  ALLERGIES:  None.  MEDICATIONS: 1. Aspirin 81 mg daily. 2. Prandin 0.5 mg twice daily. 3. Amiodarone 200 mg daily. 4. PhosLo. 5. Lipitor 40 mg daily. 6. Toprol-XL 25 mg twice daily. 7,  Ambien 5 mg daily at bedtime but of note, he had ran out these 2 weeks ago.  FAMILY  HISTORY:  His mother died at 61 of Alzheimer's.  His father died at 50 of unknown cause.  REVIEW OF SYSTEMS:  Other than what stated in HPI is negative.  PHYSICAL EXAMINATION:  GENERAL:  He is a well-developed, well-nourished black male in no acute distress. HEENT:  Benign. NECK:  Supple without lymphadenopathy.  Carotid upstrokes are +2 bilaterally.  No bruits. LUNGS:  Clear to auscultation throughout. HEART:  Regular rate and rhythm.  No murmurs, rubs, or gallops.  Normal S1  and S2. ABDOMEN:  Soft, nontender, and nondistended.  Normoactive bowel sounds. No hepatosplenomegaly. EXTREMITIES:  No cyanosis, erythema, or edema.  LABORATORY DATA:  Sodium 135, potassium 3.9, chloride 92, bicarb 27, BUN 26, and creatinine 6.31.  White cell count 16.2, hemoglobin 11.6, hematocrit 35.9, and platelet count 146.  CPKs are 327, 242, 260, and 305; MBs 4.1, 3.5, 4.0, and 4.3; troponin 0.04, 0.06, 0.08, and 0.05; relative index 1.3, 1.4, 1.5, and 1.4.  BNP less than 30.  Chest x-ray shows no active disease.  EKG shows sinus rhythm with old anterior  MI.  ASSESSMENT: 1. Atypical chest pain with positive CPK and MB, but normal relative     index consistent with renal failure, only one troponin is slightly     above normal. 2. Hypertension. 3. Diabetes mellitus. 4. Diastolic dysfunction with normal BNP. 5. Paroxysmal atrial fibrillation, now in sinus rhythm.  PLAN:  We will make n.p.o. after midnight for nuclear stress Cardiolite in the morning.  I suspect that his mild elevation in MB and slight elevation in CPK is due to his renal insufficiency.  All of his relative indexes are normal and he has only one troponin that is slightly elevated.  His chest pain was most likely due to supply demand mismatch from a markedly elevated blood pressure from missing his medication for 2 weeks although could also benefit from gastroesophageal reflux with esophageal spasm.  EKG shows no acute ST  changes.     Fransico Him, M.D.     TT/MEDQ  D:  02/28/2011  T:  03/01/2011  Job:  [id number]  cc:   Vanna Scotland. Olevia Perches, MD, [facility]  Electronically Signed by Fransico Him M.D. on 03/01/2011 07:42:55 PM

## 2011-03-02 ENCOUNTER — Inpatient Hospital Stay (HOSPITAL_COMMUNITY)
Admit: 2011-03-02 | Discharge: 2011-03-02 | Disposition: A | Discharge status: Home / Self Care | Payer: Medicare Other | Attending: Internal Medicine | Admitting: Internal Medicine

## 2011-03-02 ENCOUNTER — Inpatient Hospital Stay (HOSPITAL_COMMUNITY): Payer: Medicare Other

## 2011-03-02 DIAGNOSIS — R0602 Shortness of breath: Secondary | ICD-10-CM

## 2011-03-02 LAB — CBC
MCH: 31 pg (ref 26.0–34.0)
MCHC: 34.1 g/dL (ref 30.0–36.0)
MCV: 90.9 fL (ref 78.0–100.0)
Platelets: 152 10*3/uL (ref 150–400)
RBC: 3.29 MIL/uL — ABNORMAL LOW (ref 4.22–5.81)

## 2011-03-02 LAB — RENAL FUNCTION PANEL
Albumin: 3.3 g/dL — ABNORMAL LOW (ref 3.5–5.2)
BUN: 72 mg/dL — ABNORMAL HIGH (ref 6–23)
CO2: 25 mEq/L (ref 19–32)
Calcium: 8.3 mg/dL — ABNORMAL LOW (ref 8.4–10.5)
Chloride: 92 mEq/L — ABNORMAL LOW (ref 96–112)
Creatinine, Ser: 10.21 mg/dL — ABNORMAL HIGH (ref 0.4–1.5)
GFR calc Af Amer: 6 mL/min — ABNORMAL LOW (ref >60)
GFR calc non Af Amer: 5 mL/min — ABNORMAL LOW (ref >60)
Glucose, Bld: 168 mg/dL — ABNORMAL HIGH (ref 70–99)
Phosphorus: 5.1 mg/dL — ABNORMAL HIGH (ref 2.3–4.6)
Potassium: 3.7 mEq/L (ref 3.5–5.1)
Sodium: 130 mEq/L — ABNORMAL LOW (ref 135–145)

## 2011-03-02 LAB — GLUCOSE, CAPILLARY
Glucose-Capillary: 122 mg/dL — ABNORMAL HIGH (ref 70–99)
Glucose-Capillary: 201 mg/dL — ABNORMAL HIGH (ref 70–99)

## 2011-03-02 MED ORDER — TECHNETIUM TC 99M TETROFOSMIN IV KIT
10.0000 | PACK | Freq: Once | INTRAVENOUS | Status: AC | PRN
  Administered 2011-03-02: 10 via INTRAVENOUS

## 2011-03-02 MED ORDER — TECHNETIUM TC 99M TETROFOSMIN IV KIT
30.0000 | PACK | Freq: Once | INTRAVENOUS | Status: AC | PRN
  Administered 2011-03-02: 30 via INTRAVENOUS

## 2011-03-03 ENCOUNTER — Inpatient Hospital Stay (HOSPITAL_COMMUNITY): Payer: 59

## 2011-03-03 DIAGNOSIS — I369 Nonrheumatic tricuspid valve disorder, unspecified: Secondary | ICD-10-CM

## 2011-03-03 DIAGNOSIS — I4891 Unspecified atrial fibrillation: Secondary | ICD-10-CM

## 2011-03-03 LAB — CBC
HCT: 34.7 % — ABNORMAL LOW (ref 39.0–52.0)
Hemoglobin: 11.3 g/dL — ABNORMAL LOW (ref 13.0–17.0)
MCH: 29.9 pg (ref 26.0–34.0)
MCHC: 32.6 g/dL (ref 30.0–36.0)
MCV: 91.8 fL (ref 78.0–100.0)
Platelets: 168 10*3/uL (ref 150–400)
RBC: 3.78 MIL/uL — ABNORMAL LOW (ref 4.22–5.81)
WBC: 12.5 10*3/uL — ABNORMAL HIGH (ref 4.0–10.5)

## 2011-03-12 NOTE — Discharge Summary (Signed)
[name], [name]               ACCOUNT NO.:  [id number]  MEDICAL RECORD NO.:  [id number]          PATIENT TYPE:  LOCATION:                                 FACILITY:  PHYSICIAN:  Domenic Polite, MD     DATE OF BIRTH:  02/28/1941  DATE OF ADMISSION:  02/27/2011 DATE OF DISCHARGE:  03/03/2011                              DISCHARGE SUMMARY   PRIMARY CARE PHYSICIAN:  Elayne Snare, MD  CARDIOLOGIST:  Vanna Scotland. Olevia Perches, MD, [facility] [place].  NEPHROLOGIST:  Windy Kalata, MD  DISCHARGE DIAGNOSES: 1. Atypical chest pain, noncardiac, resolved. 2. End-stage renal disease, on hemodialysis Monday, Wednesday, Friday. 3. Paroxysmal atrial fibrillation, restarted on Coumadin. 4. Anemia of chronic disease. 5. Leukocytosis, reactive. 6. History of gout. 7. Type 2 diabetes. 8. Dyslipidemia. 9. Hypertension. A999333 of diastolic congestive heart failure. 11.Former history of tobacco use.  DISCHARGE MEDICATIONS: 1. Tylenol 650 mg q.4 h. p.r.n. 2. Coumadin 5 mg p.o. daily to be adjusted for an INR of 2-3. 3. Ambien 5 mg p.o. nightly. 4. Amiodarone 200 mg daily. 5. Aspirin 81 mg daily. 6. Back and Body Pain relief over the counter one tablet p.o. b.i.d.     p.r.n. 7. Lipitor 40 mg p.o. daily. 8. PhosLo 667 mg 3 capsules p.o. t.i.d. 9. Prandin 0.5 mg tablet p.o. b.i.d. 10.Toprol-XL 25 mg p.o. b.i.d.  CONSULTANTS:  Fransico Him, MD  HOSPITAL COURSE: 1. Mr. Dress is a 70 year old gentleman with multiple medical     problems who presented with having chest pain at dialysis, hence     admitted to the hospital for further evaluation and management.     His chest x-ray was unremarkable.  Subsequently, he was seen by     [facility].  He had a Myoview done in the hospital     yesterday.  The results of which were negative, hence Cardiology     felt that no further workup was recommended. 2. Paroxysmal atrial fibrillation, rate controlled.  He has remained     in normal  sinus rhythm on his amiodarone.  We restarted his     Coumadin because this was stopped about a little over a year ago     when he came in with suspected upper GI bleed but his EGD and     gastric workup at that time were unremarkable, and he has no other     contraindications being on Coumadin and hence Cardiology also felt     that Coumadin should be considered if no further contraindication,     hence the patient is being discharged home back on Coumadin to have     an INR check on hemodialysis on Friday and the patient also will     follow up with his primary physician, Dr. Dwyane Dee, on March 06, 2011.  DISCHARGE CONDITION:  Stable.  VITAL SIGNS ON DISCHARGE:  Temperature is 97.4, pulse 64, blood pressure 137/67, respirations 18, satting 93% on room air.  DISCHARGE FOLLOWUP: 1. Dr. Elayne Snare in 3-4 days.  The patient has an appointment on     March 06, 2011, and Dr. Mercy Moore with  [facility]. 2. INR check at hemodialysis on Friday, March 06, 2011.     Domenic Polite, MD     PJ/MEDQ  D:  03/03/2011  T:  03/04/2011  Job:  [id number]  cc:   Windy Kalata, M.D. Elayne Snare, M.D.  Electronically Signed by Domenic Polite  on 03/12/2011 05:21:01 PM

## 2011-04-13 LAB — COMPREHENSIVE METABOLIC PANEL
ALT: 19 U/L (ref 0–53)
ALT: 21 U/L (ref 0–53)
AST: 19 U/L (ref 0–37)
AST: 19 U/L (ref 0–37)
Albumin: 3 g/dL — ABNORMAL LOW (ref 3.5–5.2)
Alkaline Phosphatase: 101 U/L (ref 39–117)
Alkaline Phosphatase: 88 U/L (ref 39–117)
CO2: 25 mEq/L (ref 19–32)
Calcium: 8.6 mg/dL (ref 8.4–10.5)
Chloride: 100 mEq/L (ref 96–112)
Chloride: 101 mEq/L (ref 96–112)
Creatinine, Ser: 5.77 mg/dL — ABNORMAL HIGH (ref 0.4–1.5)
GFR calc Af Amer: 12 mL/min — ABNORMAL LOW (ref >60)
GFR calc Af Amer: 17 mL/min — ABNORMAL LOW (ref >60)
GFR calc non Af Amer: 10 mL/min — ABNORMAL LOW (ref >60)
Glucose, Bld: 82 mg/dL (ref 70–99)
Potassium: 3.4 mEq/L — ABNORMAL LOW (ref 3.5–5.1)
Sodium: 135 mEq/L (ref 135–145)
Sodium: 140 mEq/L (ref 135–145)
Total Bilirubin: 0.5 mg/dL (ref 0.3–1.2)
Total Protein: 5.9 g/dL — ABNORMAL LOW (ref 6.0–8.3)
Total Protein: 6.2 g/dL (ref 6.0–8.3)

## 2011-04-13 LAB — POCT CARDIAC MARKERS
CKMB, poc: <1 ng/mL — ABNORMAL LOW (ref 1.0–8.0)
Myoglobin, poc: >500 ng/mL (ref 12–200)
Troponin i, poc: <0.05 ng/mL (ref 0.00–0.09)

## 2011-04-13 LAB — RENAL FUNCTION PANEL
Albumin: 2.9 g/dL — ABNORMAL LOW (ref 3.5–5.2)
BUN: 43 mg/dL — ABNORMAL HIGH (ref 6–23)
CO2: 28 mEq/L (ref 19–32)
Calcium: 8.1 mg/dL — ABNORMAL LOW (ref 8.4–10.5)
Creatinine, Ser: 6.08 mg/dL — ABNORMAL HIGH (ref 0.4–1.5)
GFR calc Af Amer: 11 mL/min — ABNORMAL LOW (ref >60)
GFR calc non Af Amer: 9 mL/min — ABNORMAL LOW (ref >60)
Phosphorus: 4.6 mg/dL (ref 2.3–4.6)
Sodium: 140 mEq/L (ref 135–145)

## 2011-04-13 LAB — CBC
HCT: 21.1 % — ABNORMAL LOW (ref 39.0–52.0)
HCT: 25.3 % — ABNORMAL LOW (ref 39.0–52.0)
HCT: 27.3 % — ABNORMAL LOW (ref 39.0–52.0)
Hemoglobin: 7.1 g/dL — CL (ref 13.0–17.0)
Hemoglobin: 9.4 g/dL — ABNORMAL LOW (ref 13.0–17.0)
MCHC: 33.8 g/dL (ref 30.0–36.0)
MCHC: 33.8 g/dL (ref 30.0–36.0)
MCV: 92.8 fL (ref 78.0–100.0)
MCV: 92.9 fL (ref 78.0–100.0)
MCV: 94.2 fL (ref 78.0–100.0)
Platelets: 194 10*3/uL (ref 150–400)
Platelets: 207 10*3/uL (ref 150–400)
RBC: 2.24 MIL/uL — ABNORMAL LOW (ref 4.22–5.81)
RBC: 2.94 MIL/uL — ABNORMAL LOW (ref 4.22–5.81)
RBC: 2.99 MIL/uL — ABNORMAL LOW (ref 4.22–5.81)
RDW: 19.2 % — ABNORMAL HIGH (ref 11.5–15.5)
RDW: 19.4 % — ABNORMAL HIGH (ref 11.5–15.5)
RDW: 20.1 % — ABNORMAL HIGH (ref 11.5–15.5)
WBC: 10.2 10*3/uL (ref 4.0–10.5)
WBC: 10.7 10*3/uL — ABNORMAL HIGH (ref 4.0–10.5)
WBC: 11.3 10*3/uL — ABNORMAL HIGH (ref 4.0–10.5)
WBC: 11.4 10*3/uL — ABNORMAL HIGH (ref 4.0–10.5)

## 2011-04-13 LAB — GLUCOSE, CAPILLARY
Glucose-Capillary: 102 mg/dL — ABNORMAL HIGH (ref 70–99)
Glucose-Capillary: 104 mg/dL — ABNORMAL HIGH (ref 70–99)
Glucose-Capillary: 109 mg/dL — ABNORMAL HIGH (ref 70–99)
Glucose-Capillary: 110 mg/dL — ABNORMAL HIGH (ref 70–99)
Glucose-Capillary: 110 mg/dL — ABNORMAL HIGH (ref 70–99)
Glucose-Capillary: 110 mg/dL — ABNORMAL HIGH (ref 70–99)
Glucose-Capillary: 137 mg/dL — ABNORMAL HIGH (ref 70–99)
Glucose-Capillary: 164 mg/dL — ABNORMAL HIGH (ref 70–99)
Glucose-Capillary: 171 mg/dL — ABNORMAL HIGH (ref 70–99)
Glucose-Capillary: 68 mg/dL — ABNORMAL LOW (ref 70–99)
Glucose-Capillary: 76 mg/dL (ref 70–99)
Glucose-Capillary: 86 mg/dL (ref 70–99)
Glucose-Capillary: 96 mg/dL (ref 70–99)

## 2011-04-13 LAB — CROSSMATCH: ABO/RH(D): O POS

## 2011-04-13 LAB — IRON AND TIBC: UIBC: 199 ug/dL

## 2011-04-13 LAB — DIFFERENTIAL
Basophils Relative: 1 % (ref 0–1)
Eosinophils Absolute: 0.1 10*3/uL (ref 0.0–0.7)
Lymphs Abs: 1.1 10*3/uL (ref 0.7–4.0)
Monocytes Absolute: 0.9 10*3/uL (ref 0.1–1.0)
Neutro Abs: 9.2 10*3/uL — ABNORMAL HIGH (ref 1.7–7.7)
Neutrophils Relative %: 80 % — ABNORMAL HIGH (ref 43–77)

## 2011-04-13 LAB — PROTIME-INR
INR: 1.3 (ref 0.00–1.49)
Prothrombin Time: 16.3 seconds — ABNORMAL HIGH (ref 11.6–15.2)
Prothrombin Time: 16.3 seconds — ABNORMAL HIGH (ref 11.6–15.2)

## 2011-04-13 LAB — POCT I-STAT, CHEM 8
BUN: 41 mg/dL — ABNORMAL HIGH (ref 6–23)
Chloride: 103 mEq/L (ref 96–112)
Creatinine, Ser: 5.3 mg/dL — ABNORMAL HIGH (ref 0.4–1.5)
Hemoglobin: 7.1 g/dL — CL (ref 13.0–17.0)
Potassium: 3.6 mEq/L (ref 3.5–5.1)
Sodium: 142 mEq/L (ref 135–145)

## 2011-04-13 LAB — PHOSPHORUS: Phosphorus: 3.2 mg/dL (ref 2.3–4.6)

## 2011-04-13 LAB — HEMOGLOBIN AND HEMATOCRIT, BLOOD: HCT: 24.4 % — ABNORMAL LOW (ref 39.0–52.0)

## 2011-04-13 LAB — HEMOGLOBIN A1C: Hgb A1c MFr Bld: 5.5 % (ref 4.6–6.1)

## 2011-04-14 LAB — POCT I-STAT, CHEM 8
Chloride: 98 mEq/L (ref 96–112)
Glucose, Bld: 112 mg/dL — ABNORMAL HIGH (ref 70–99)
HCT: 44 % (ref 39.0–52.0)
Hemoglobin: 15 g/dL (ref 13.0–17.0)
Potassium: 4 mEq/L (ref 3.5–5.1)
Sodium: 139 mEq/L (ref 135–145)

## 2011-04-14 LAB — GLUCOSE, CAPILLARY

## 2011-04-17 ENCOUNTER — Ambulatory Visit: Payer: Medicare Other | Admitting: Nurse Practitioner

## 2011-04-22 ENCOUNTER — Encounter: Payer: Self-pay | Admitting: Cardiology

## 2011-04-23 ENCOUNTER — Ambulatory Visit (INDEPENDENT_AMBULATORY_CARE_PROVIDER_SITE_OTHER): Payer: Medicare Other | Admitting: Cardiology

## 2011-04-23 ENCOUNTER — Encounter: Payer: Self-pay | Admitting: Cardiology

## 2011-04-23 VITALS — BP 128/70 | HR 53 | Ht 67.0 in | Wt 191.8 lb

## 2011-04-23 DIAGNOSIS — E785 Hyperlipidemia, unspecified: Secondary | ICD-10-CM

## 2011-04-23 DIAGNOSIS — I4891 Unspecified atrial fibrillation: Secondary | ICD-10-CM

## 2011-04-23 DIAGNOSIS — I509 Heart failure, unspecified: Secondary | ICD-10-CM

## 2011-04-23 DIAGNOSIS — I5032 Chronic diastolic (congestive) heart failure: Secondary | ICD-10-CM

## 2011-04-23 MED ORDER — ATORVASTATIN CALCIUM 40 MG PO TABS: 40.0000 mg | ORAL_TABLET | Freq: Every day | ORAL | Status: DC

## 2011-04-23 MED ORDER — ATORVASTATIN CALCIUM 10 MG PO TABS: 10.0000 mg | ORAL_TABLET | Freq: Every day | ORAL | Status: DC

## 2011-04-23 NOTE — Assessment & Plan Note (Signed)
Patient appears euvolemic.  He is on HD for fluid removal.

## 2011-04-23 NOTE — Progress Notes (Signed)
PCP: Dr. Dwyane Dee  70 yo with history of paroxysmal atrial fibrillation, diastolic CHF, ESRD presents to establish cardiology care.  Patient seems to be doing well.  He denies any tachypalpitations or sensation of irregular heart rhythm.  He is able to walk around his house without exertional dyspnea, but in general he is not very active.  He is able to shop for groceries.  No chest pain.  He no longer smokes.  He does have diabetes, HTN, and hyperlipidemia.  He also seems to have some short term memory difficulty.    ECG: NSR, old ASMI  Labs (3/12): HCT 34.7, LDL 113, HDL   PMH:  1. Atrial fibrillation: paroxysmal, on warfarin 2. Early dementia (?) 3. ESRD 4. OSA 5. Diastolic CHF: Echo (A999333) with EF 0000000, grade I diastolic dysfunction, very mild AS, PA systolic pressure 38 mmHg.  6. HTN 7. DM 8. Hyperlipidemia 9. H/o upper GI bleed 10. Gout 11. Carotid dopplers (2/12) with no significant disease. 12.  Lexiscan myoview (3/12) with EF 66%, no ischemia or infarction.   SH: Quit smoking in 2005.  No ETOH.  Lives by himself (divorced) in [place].  Daughter and son live nearby.   FH: Noncontributory.   ROS: All systems reviewed and negative except as per HPI.   Current Outpatient Prescriptions  Medication Sig Dispense Refill  . amiodarone (PACERONE) 200 MG tablet Take 200 mg by mouth daily.        . calcium acetate (PHOSLO) 667 MG capsule Take 1 tablet by mouth. 3 TIMES DAILY       . repaglinide (PRANDIN) 1 MG tablet Take 1 mg by mouth 2 (two) times daily before a meal.        . WARFARIN SODIUM PO Take by mouth. AS DIRECTED        . DISCONTD: aspirin 325 MG tablet Take 325 mg by mouth daily.        Marland Kitchen atorvastatin (LIPITOR) 40 MG tablet Take 1 tablet (40 mg total) by mouth daily.  30 tablet  3  . DISCONTD: atorvastatin (LIPITOR) 10 MG tablet Take 1 tablet (10 mg total) by mouth daily.  30 tablet  3  . DISCONTD: atorvastatin (LIPITOR) 40 MG tablet Take 40 mg by mouth daily.          Marland Kitchen DISCONTD: metoprolol (LOPRESSOR) 50 MG tablet Take 50 mg by mouth 2 (two) times daily.          BP 128/70  Pulse 53  Ht 5\' 7"  (1.702 m)  Wt 191 lb 12.8 oz (87 kg)  BMI 30.04 kg/m2 General: NAD Neck: No JVD, no thyromegaly or thyroid nodule.  Lungs: Clear to auscultation bilaterally with normal respiratory effort. CV: Nondisplaced PMI.  Heart regular S1/S2, no S3/S4, 1/6 SEM.  No peripheral edema.  No carotid bruit.  Normal pedal pulses.  Abdomen: Soft, nontender, no hepatosplenomegaly, no distention.  Skin: Intact without lesions or rashes.  Neurologic: Alert and oriented x 3, ? Some short term memory deficit.  Psych: Normal affect. Extremities: No clubbing or cyanosis.  HEENT: Normal.

## 2011-04-23 NOTE — Patient Instructions (Addendum)
Stop Aspirin.  I refilled your Lipitor 40mg  to [facility] [facility].  Schedule an appointment for a pulmonary function test.  Fasting lab in 2 months--lipid profile/liver profile [phone]  [phone]  Your physician wants you to follow-up in: 6 months with Dr Aundra Dubin.(October 2012).You will receive a reminder letter in the mail two months in advance. If you don't receive a letter, please call our office to schedule the follow-up appointment.

## 2011-04-23 NOTE — Assessment & Plan Note (Addendum)
Paroxysmal.  Sinus rhythm on amiodarone.  He is taking warfarin.  Continue coumadin, amiodarone, and metoprolol.  Recent LFTs and TSH were normal (amiodarone monitoring).  I have also asked him to get his eyes checked yearly.  I will get PFTs.  He should continue warfarin but can stop aspirin with no known coronary diseaes.

## 2011-04-24 ENCOUNTER — Ambulatory Visit: Payer: Self-pay | Admitting: Internal Medicine

## 2011-04-24 DIAGNOSIS — I4891 Unspecified atrial fibrillation: Secondary | ICD-10-CM

## 2011-04-24 DIAGNOSIS — E785 Hyperlipidemia, unspecified: Secondary | ICD-10-CM | POA: Insufficient documentation

## 2011-04-29 LAB — PROTIME-INR: INR: 1.4 — AB (ref 0.9–1.1)

## 2011-05-01 ENCOUNTER — Ambulatory Visit: Payer: Self-pay | Admitting: Cardiology

## 2011-05-06 LAB — PROTIME-INR: INR: 1.7 — AB (ref <=1.1)

## 2011-05-08 ENCOUNTER — Ambulatory Visit (INDEPENDENT_AMBULATORY_CARE_PROVIDER_SITE_OTHER): Payer: Self-pay | Admitting: Cardiology

## 2011-05-08 DIAGNOSIS — R0989 Other specified symptoms and signs involving the circulatory and respiratory systems: Secondary | ICD-10-CM

## 2011-05-12 NOTE — H&P (Signed)
[name], [name] NO.:  [id number]   MEDICAL RECORD NO.:  [id number]          PATIENT TYPE:  EMS   LOCATION:  MAJO                         FACILITY:  [facility]   PHYSICIAN:  Truddie Hidden, MD  DATE OF BIRTH:  May 30, 1941   DATE OF ADMISSION:  01/25/2008  DATE OF DISCHARGE:                              HISTORY & PHYSICAL   PRIMARY CARDIOLOGIST:  Ernestine Mcmurray, MD,FACC.   PRIMARY CARE:  None at this time.  The patient is followed by Elayne Snare, M.D. for management of his diabetes.   [name] is a 70 year old African American gentleman with known  history of diastolic heart failure with a normal EF by stress Myoview  and echocardiogram in 2005.  Also with known history of chronic renal  insufficiency.  States he was followed by Dr. Florene Glen in the past but the  patient reports he was released from Dr. Abel Presto care a few years ago.  He also has severe hypertension and diabetes with obstructive sleep  apnea.  He has not been seen by cardiology in at least in years.  He  also states that Dr. Dannielle Burn released him from his care also.  States he  has been in his normal health, was in [place] for a vacation two weeks  ago and had some symptoms suggestive of upper respiratory illness at  that time.  States that resolved.  He returned home.  This Saturday, he  began complaining of some gradual shortness of breath.  This continued  Monday and Tuesday. Tuesday, he had a low grade fever of 100, states he  was coughing up brown tan sputum, ate some soup but as the day went on,  the shortness of breath worsened.  This morning his family took him to  Urgent Care to get evaluated.  There, he was found to have a blood  pressure of 198/91.  He was afebrile, sat 90% on room air.  A 12-lead  EKG was obtained.  The patient was noted to have some T-wave inversions  in V5 and V6.  The patient was transferred to [facility] for further evaluation in the setting  of increased dyspnea,  productive cough and some chest discomfort.  [name] describes the  chest discomfort as a tightness associated with coughing and deep  breathing.  He also complains of increased urine output, abdominal  fullness, but states he has been eating more while he was on vacation.  He states he has chronic edema in his lower extremities.  This is no  worse.  He also complains of chronic orthopnea, sleeps with head of his  bed elevated as he has sleep apnea and states he has no problems wearing  his CPAP every night.  He also complains of some pain in his calves with  walking.  He describes it as a tightness.   ALLERGIES:  NO KNOWN DRUG ALLERGIES.   MEDICATIONS:  1. Allegra D.  2. Avandia 2.  3. Amiloride 5 mg b.i.d.  4. Amaryl 4 mg daily.  5. KCl 20 mEq t.i.d.  6. Vytorin 10/40 daily.  7. Benicar, the patient is unsure of dose.  8. Aspirin 81 mg daily.  9. Furosemide 80 in the a.m. and 40 in the evening.   PAST MEDICAL HISTORY:  1. Diabetes type 2.  2. Chronic renal insufficiency.  Baseline creatinine of 2.1, although      previous records document a creatinine as high as 6 in the past.  3. The patient ruled out for renal artery stenosis, has been followed      in the past by Dr. Florene Glen.  4. Diastolic heart failure.  5. Severe hypertension.  6. Obstructive sleep apnea with compliance to CPAP.  7. Elevated PSA, pending workup.  8. Diabetic neuropathy.  9. Hypercholesteremia.  10.Obesity.   SOCIAL HISTORY:  He lives in [place], [place], alone.  He is  retired from [employer].  He has three adult children.  Quit  using tobacco years ago.  Has a sedentary lifestyle.  Tries to follow a  ADA no added salt diet.  Denies any drug or herbal medication use.   FAMILY HISTORY:  Mother deceased secondary to complications of  Alzheimer's.  Father unknown cause of death.  Siblings with known  diabetes.  No coronary artery disease or heart failure.    REVIEW OF SYSTEMS:  Positive for fever or chills, chest pain, shortness  of breath, dyspnea on exertion, orthopnea, edema, claudication type  symptoms, cough all as described in history of present illness.  The  patient also complained of increased frequency and urgency with  urination, abdominal fullness.   PHYSICAL EXAMINATION:  VITAL SIGNS:  Temperature 96.9, pulse 94,  respirations 22, blood pressure 193/105.  The patient was sating 90% on  room air and 93 on 3 liters.  GENERAL APPEARANCE:  In no acute distress.  HEENT:  Normocephalic, atraumatic.  Pupils equal, round, reactive to  light.  Sclera is clear.  He has his own teeth.  NECK:  Supple without lymphadenopathy, JVD 12 cm at 45 degree angle.  CARDIOVASCULAR:  Exam reveals an S1 and S2, regular rate and rhythm.  LUNGS:  He has basilar crackles bilaterally.  ABDOMEN:  Obese, soft, nontender.  Liver 8 cm.  LOWER EXTREMITIES:  Without clubbing, cyanosis or edema.  He has 2+  pulses.  NEUROLOGIC:  Alert and oriented x3.   Chest x-ray showing confluent opacity right upper lobe.  Follow-up chest  x-ray recommended after acute symptoms resolved.  Also CHF.   EKG showing sinus rhythm, T-wave inversions V5-V6 at a rate of 91.   H&H 10.5 and 31.6, wbc 9.6, platelets 277,000.  Sodium 143, potassium  3.8, chloride 111, BUN 39, creatinine 3.1, glucose 104.  BNP 1047.  D-  dimer 1.23.  Point of care markers troponin less than 0.05.   Dr. Minus Breeding in to examine and assess patient with symptoms as  stated above.   ASSESSMENT:  1. Patient with history of diastolic heart failure.  Will use IV Lasix      to gently diurese and will need to watch creatinine closely.      Repeat echocardiogram.  The patient needs to be ruled out for      myocardial infarction.  Will admit to telemetry, questionable.  2. Pneumonia.  Will start Rocephin.  3. Medical noncompliance.  4. Renal insufficiency, appears to be chronic.  Will follow closely.   5. Hypertension uncontrolled.  Will hold ARB, will start labetalol and      hydralazine.  6. EKG  changes most likely left ventricular hypertrophy.      Rosanne Sack, ACNP    ______________________________  Truddie Hidden, MD    MB/MEDQ  D:  01/25/2008  T:  01/26/2008  Job:  [id number]

## 2011-05-12 NOTE — Discharge Summary (Signed)
NAMEJALLEN, [name]               ACCOUNT NO.:  [id number]   MEDICAL RECORD NO.:  [id number]          PATIENT TYPE:  INP   LOCATION:  5153                         FACILITY:  [facility]   PHYSICIAN:  Marcello Moores A. Cornett, M.D.DATE OF BIRTH:  1941/06/18   DATE OF ADMISSION:  09/07/2008  DATE OF DISCHARGE:                               DISCHARGE SUMMARY   CONSULTANTS:  Dr. Erling Cruz with nephrology and Dr. Aundra Dubin with  cardiology.   CHIEF COMPLAINT/REASON FOR ADMISSION:  [name] is a 70 year old male  patient known history of chronic renal insufficiency followed by Dr.  Florene Glen presented to ER with right upper quadrant pain radiating to the  back and right upper abdominal pain as well without nausea, vomiting or  fevers, constant pain presented to the ER.  CT scan revealed haziness  around the gallbladder, no fluid.  Ultrasound revealed wall thickening  and no fluid stones were noted.  White count was elevated 14,000,  alkaline phosphatase was 128, AST, ALT were normal.   PHYSICAL EXAMINATION:  The patient's abdomen was obese and tender.  Mass  in the right upper quadrant and some on the right side.  No signs of an  acute abdomen.  No peritoneal signs.   ADMITTING DIAGNOSES:  1. Cholecystitis subacute on acute.  2. Congestive heart failure history.  3. History diabetes.  4. History of hypertension.  5. History of chronic renal insufficiency.   HOSPITAL COURSE:  The patient was admitted by Dr. Zenia Resides to monitor bed.  He was started on IV Zosyn with renal dosing.  It was felt that the  patient would benefit from cholecystectomy but given his multiple  comorbidities including cardiac issues, cardiology consult was pending  to determine if he was an adequate surgical candidate from a cardiac  standpoint.  In the interim medical therapy was opted to begin with  antibiotics and bowel rest.   The patient was evaluated by Dr. Algernon Huxley with cardiology.  On the date  of admission the patient  was found to have diastolic congestive heart  failure NYHA class II without any symptoms.  He was noted to have of 4+  METS exercise tolerance and is euvolemic on exam, therefore it was  recommended he could proceed with laparoscopic cholecystectomy without  further cardiac testing favoring a laparoscopic approach and watching  perioperative and postoperative volume control.   On September 09, 2008, the patient was taken to the OR by Dr. Zenia Resides  where he underwent laparoscopic cholecystectomy for acute cholecystitis.  Procedure was unremarkable and the patient was sent back to the general  floor to recover.  In the postoperative period, cardiology followed  along with [place] monitoring the patient's cardiac status as well as  assisting in his monitoring of renal status given the fact that he had  been on diuretics preadmission.  From a postoperative standpoint the  patient did well.  He was started back on a regular diet and his usual  medications by postop day #1 and plans were to discharge the patient  home but unfortunately his creatinine by postoperative day #1 had  increased  to 4.37 and cardiology requested the patient stay in the  hospital an additional day.  By the following day his creatinine was up  to 6.6.  Therefore a renal consult was obtained.   For the next portion of the hospitalization the patient's problems  related to altered mental status and associated worsening renal function  he began developing hematuria.  This was related to the fact that the  patient was pulling on Foley catheter that had been placed.  Therefore  this required the patient to be placed in restraints.  His creatinine  continued to climb and by postop day #3 his creatinine was up to 7.94.  At this point, nephrology continued supportive care.  Please refer to  their notes for details.  Aggressive diuresis was initiated by  nephrology.  A renal ultrasound demonstrated no hydronephrosis.  Foley  in correct  placement in bladder.  The patient also began developing  leukocytosis and abdominal pain as well.  By postoperative day #6 it was  recommended by nephrology the patient start hemodialysis and patient  received several treatments with hemodialysis during the hospitalization  and by in September 17, 2008, which would be postop day #8, the patient  received his last hemodialysis treatment at which point nephrology opted  to watch him closely and by postoperative day #16, which was the  anticipated date of discharge, the patient's creatinine had decreased to  4.88.  In the interim his Foley catheter had been removed several days  prior.  He initially had hematuria which has also resolved and from a  renal standpoint he is stable.  He was on several medications  preoperatively, include metolazone and furosemide that I have placed  currently on hold and do not anticipate nephrology reinstituting these  medications immediately.   In addition to the renal failure problems, the patient also developed  issues related to atrial fibrillation with rapid ventricular response.  He had been taken off his usual beta blocker.  Some of this could have  been from beta blocker withdrawal but EKG did reveal atrial  fibrillation.  The patient was started back on a long-acting beta  blocker and currently is tolerating Toprol 150 mg daily.  His atrial  fibrillation has converted to normal sinus rhythm and he has not had no  further difficulty with that since that time.   From a surgical standpoint initially the patient had done well other  than with the postoperative problems related to medical issues.  By  postoperative day #6 the patient was complaining of severe pain to the  right flank.  His white count was 11,800.  He continued to have pain and  his white count had increased to 15,200 by postoperative day #7, so a CT  of the abdomen and pelvis had been ordered and this demonstrated an  abscess in the  gallbladder fossa.  The patient subsequently was taken to  interventional radiology on postoperative day #9 where a drain was  placed into this abscess cavity, 20 mL of bile were aspirated.  A 12-  French catheter was left in place.  Subsequent cultures were negative.  The patient at that time had been continued on IV Zosyn.  Post drain  placement the patient's white count decreased by postop day #9, was down  to 11,400 and eventually his white count normalized and his antibiotics  were able to be decreased.  Because of the bilious color of the initial  drain output, a HIDA scan was performed  to check for a bile leak.  This  was essentially an equivocal study.  There was no bile leak seen but  with clamping of the right upper quadrant drain there was noted to be a  collection of contrast at the tip end of the drain, uncertain of the  significance.  The patient's LFTs remained normal and again his white  counts had normalized.   On postop day 12 the right upper quadrant drain output had changed from  a bilious appearance to a blood-tinged appearance.  Coags were checked;  these were within normal limits.  There was some concern with given  patient's confusion that the drain was dislodged.  The drain was  injected in interventional radiology.  It was discovered that due to  contraction of the abscess cavity the drain had slipped back so the  drain was repositioned and otherwise was deemed an appropriate  placement.  No evidence of bile leak seen.  On date of anticipated date  of discharge, plans were to repeat a CT of the abdomen and pelvis to  evaluate the gallbladder fossa and the drain area and plans were for the  patient to follow up with Dr. Zenia Resides in several weeks after discharge.   As previously mentioned the patient had difficulty with altered mental  status this admission, became quite worse due to issues related to  progressive alterations in renal function which have now  improved, acute  infection which has resolved and of course postoperative issues related  to anesthesia and pain medications.  PT consult was obtained and during  the middle part of hospitalization the patient was requiring physical  therapy and use of a walker and again had to be restrained on multiple  occasions because of picking and pulling at things and getting out of  bed unassisted and I think there were a couple of occasions where the  patient actually got up and was found sitting in the floor.  Because of  these issues the patient's daughter Lujean Rave requested that we at least  short-term place the patient at skilled nursing facility/rehab until he  could completely recover and hopefully return back to his baseline  mental status.  By anticipated date of discharge the patient had  improved remarkably but given the fact he would need to discharge home  with a drain and multiple changes in medications and still having some  issues with confusion, it was felt best that he go ahead and discharge  to the skilled nursing facility as planned.  I had previously discussed  this with the patient's daughter Lujean Rave and she agrees.  On the  anticipated date of discharge, I approached the patient about the  discharge plans.  Apparently, this had not been discussed with the  patient by the daughter.  He was amendable to returning to a skilled  nursing facility given the fact that most likely this would be a short-  term issue.  The patient has a neighbor who is very close to him who  checks on him and was in the room during my conversation and she  reinforce the fact that at this point the patient would probably best be  served by going short-term to a skilled nursing facility.   At time of dictation, I am awaiting nephrology evaluation of the patient  to determine if he is appropriate for discharge to the skilled nurse  facility.  Again the CT scan is pending and we will send the patient out  with a drain in place and plans to follow up with Dr. Zenia Resides a few weeks,  follow up with nephrology as directed.   FINAL DISCHARGE DIAGNOSES:  1. Abdominal pain secondary to biliary colic/acute      cholecystitis/cholelithiasis.  2. Status post laparoscopic cholecystectomy on September 09, 2008, by      Dr. Zenia Resides.  3. Chronic renal failure with acute exacerbation requiring transient      hemodialysis this admission.  4. Atrial fibrillation with rapid ventricular response.  Convert to      sinus rhythm with medications.  5. Diabetes stable.  6. Altered mental status, multifactorial as previously mentioned      improved.  7. Gallbladder fossa abscess postoperative status post trach placement      of right upper quadrant drain.  8. History of gout.  9. History of benign prostatic hypertrophy.  10.History of class II New York Heart Association (NYHA) heart      failure, diastolic, asymptomatic.   DISCHARGE MEDICATIONS:  The patient will resume the following home  medications:  1. Actos 15 mg daily.  2. Amlodipine 10 mg daily.  3. Glimepiride 2 mg daily in the evening.  4. Vitamin D tablet daily.  5. Aspirin 81 mg daily.  6. Calcium 800 mg daily.  7. Uroxatral 10 mg daily.   The following home medications are currently placed on hold.  If this  remains so during the stay at [facility], the patient and family  need to be completely instructed as to which medications to stop that  are currently at home and they are as follows:  1. Allopurinol 100 mg daily.  2. Furosemide 80 mg 2 tablets in the morning and 1 tablet in the      evening.  3. Labetalol 400 mg b.i.d.  4. Iron tablet daily.  5. Metolazone 2.5 mg daily.   The following are new medications:  1. Metoprolol XL 150 mg daily.  2. Percocet 5/325 1-2 tabs every 4 hours as needed pain.  3. Nephro-Vite 1 p.o. daily.  4. PhosLo 667 mg t.i.d. with meals.   ADDITIONAL INSTRUCTIONS:  1. Diet:  Diabetic low-fat with renal  precaution.  2. Wound care:  Empty  the right upper quadrant drain every 8 hours      and record output.  Bring output to followup appointments with the      surgeon.  3. Activity:  Unrestricted.  Physical therapy evaluation upon      admission to ensure the patient does not need assistive devices or      has other issues.  4. Hygiene:  The patient is to sponge bathe only while the drain is in      place.   FOLLOWUP APPOINTMENTS:  1. He has an appointment with Dr. Ronnald Collum at [facility], telephone number [phone] on Thursday October 15 at 9:15      a.m.  He is to arrive at 8:45 a.m. to complete any necessary      paperwork.  2. He is to follow up with Dr. Florene Glen as directed.  Anticipate the      patient will need a BMET within 1 week after arrival to skilled      nursing facility and this should be faxed to Dr. Abel Presto office.  3. He is to follow up with his primary care physician, Dr. Dwyane Dee      within 1-2 weeks after discharge from the skilled nursing  facility.  4. He is to follow up with Dr. Percival Spanish his cardiologist, telephone      number [phone] two weeks after discharge from the skilled nursing      facility.      [place] Lissa Merlin, N.P.      Thomas A. Cornett, M.D.  Electronically Signed    ALE/MEDQ  D:  09/25/2008  T:  09/25/2008  Job:  [id number]   cc:   Mare Loan, MD  Minus Breeding, MD, [place] Florene Glen, M.D.  Elayne Snare, M.D.

## 2011-05-12 NOTE — H&P (Signed)
[name], [name]               ACCOUNT NO.:  [id number]   MEDICAL RECORD NO.:  [id number]          PATIENT TYPE:  INP   LOCATION:  [id number]                         FACILITY:  [facility]   PHYSICIAN:  Farris Has, MDDATE OF BIRTH:  January 12, 1941   DATE OF ADMISSION:  10/17/2008  DATE OF DISCHARGE:                              HISTORY & PHYSICAL   PRIMARY CARE [name]:  [name], M.D.   The patient is a 70 year old gentleman with chief complaint of shortness  of breath.   He has a history of diastolic dysfunction; chronic renal insufficiency  with improving renal function and now status post hemodialysis, history  of recent cholecystectomy with a complicated hospital course, and  history of diabetes.   For the past few days the patient has been having worsening shortness of  breath and occasional wheezing.  No fevers, but some chills.  His chest  x-ray showed some interstitial infiltrates and his hemoglobin was noted  to be down to 7.4, which prompted admission.  [facility] were  called for admit.  Otherwise, the patient denies any abdominal pain, but  it does seem to be somewhat distended.  He reports that he had a recent  bowel movement, so denies any chest pain.   REVIEW OF SYSTEMS:  The patient denies any black stools.  No  hematochezia.  No hematemesis.  She denies ever having transfusions.  Looking back, he has had hemoglobin down to 8.4 in the past few months  .Otherwise unremarkable.   PAST MEDICAL HISTORY:  1. Recent cholecystitis status post cholecystectomy.  2. Congestive heart failure.  3. Diastolic dysfunction.  4. History of diabetes.  5. Hypertension.  6. Chronic renal insufficiency.  7. Sleep apnea on CPAP.  8. Diabetic neuropathy.  9. Obesity.   SOCIAL HISTORY:  The patient lives in [facility] facility  currently.  He used to smoke but quit years back.  Does not drink and  does not use drugs.   FAMILY HISTORY:  Noncontributory.   ALLERGIES:  NO KNOWN DRUG ALLERGIES.   MEDICATIONS:  1. Aspirin 81 mg daily.  2. Amlodipine 10 mg daily.  3. Amaryl 2 mg daily.  4. Vitamin D.  5. Calcium.  6. Uroxatral 10 mg daily.  7. Toprol 150 mg daily.  8. Actos 50 mg p.o. daily  9. __________ p.o. daily.  10.PhosLo 667 t.i.d. meals.   PHYSICAL EXAMINATION:  VITALS:  Temperature 99.0, blood pressure 135/65,  respirations 20, pulse 79, saturations 100% on 3 liters.  The patient  appears to be in no acute distress, but speaking in somewhat shortened  sentences with some shortness of breath visible.  HEENT:  Head nontraumatic.  NECK:  Some JVD noted.  LUNGS:  With occasional wheezes bilaterally and occasional crackles.  HEART:  Regular rate and rhythm.  No murmurs, rubs or gallops.  ABDOMEN:  Distended, normal bowel sounds.  Some peri-incisional  tenderness, which is normal.  EXTREMITIES:  Lower Extremities: No clubbing, cyanosis or edema.  NEUROLOGIC:  Neurologically intact.  RECTAL:  There is stool in the vault, Hemoccult negative.   White blood  cell count 8.2, hemoglobin 7.4 (down from 10.7 on September 21, 2008)  Sodium 142, potassium 3.7, creatinine 4.3 (which is  improving).  BNP 858.  Cardiac enzymes within normal limits.  UA showing  more than 300 protein.  Hemoccult negative.  Chest x-ray showing  interstitial opacities, which may be edema versus infectious in  etiology.   EKG:  Showing sinus rhythm with occasional premature atrial complexes.  No evidence of acute ischemia infarction.   ASSESSMENT/PLAN:  This is a 70 year old gentleman with history of heart  failure, diastolic dysfunction and renal insufficiency, as well as a  recent complicated hospital course for cholecystitis requiring  cholecystectomy.   1. Shortness of breath:  The differential here is somewhat broad, as      the patient likely has multiple causes.  We wonder if there is a      contribution of diastolic dysfunction versus a typical  infectious      process going on.  Also, given recent hospitalization and      operation, we will obtain D-dimer.  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  Will give albuterol,      Atrovent and start Advair, as the patient is currently somewhat      wheezy.  Will need a follow-up chest x-ray to see if there is any      improvement or worsening of his chest x-ray findings.  For right      now we will cover for atypical infections/COPD with doxycycline.      If more typical pneumonia picture develops, may need to broaden      this..  Also, of course, an even more obvious reason for shortness      of breath is his severe anemia, which we will transfuse and give      Lasix in between.  2. Anemia:  Etiology is not quite clear at this point.  He is      Hemoccult negative but will cycle Hemoccult.  We will transfuse 2      units, and wonder if secondary to chronic disease.  Will check      anemia panel and follow CBC.  If there is any evidence of GI bleed      we will need to consult GI. Prophylaxis:  Protonix plus SCDs.  Will      avoid Lovenox until sure the patient does not have a GI bleed.  3. Chronic Kidney Disease.  Follow creatinine.  Be careful of Lasix.      May need to have Renal see the patient to make sure that they do      not have anything else to add.  So far I think that his creatinine      is rather improving.  It would be okay to perhaps hold off until we      see what is the progression.  4. Hypertension:  Continue Norvasc plus Toprol.  Of note, the patient      has had history of paroxysmal atrial fibrillation postoperatively.      We will put on telemetry and hold off on Coumadin or      anticoagulation until we are sure the patient does not have any      evidence of bleeding; currently he is sinus rhythm.  5. Abdominal Distention:  Obtain KUB.  His abdomen is not tender.  He      is  not complaining of abdominal pain.  I      doubt  that he has an intra-abdominal hematoma.  He is also not      complaining of any back pain which would suggest a retroperitoneal      bleed either.  I am guessing this is more a gaseous distention      versus ascites.  If KUB is unremarkable, would need a possible      right abdominal ultrasound.      Farris Has, MD  Electronically Signed     AVD/MEDQ  D:  10/18/2008  T:  10/18/2008  Job:  ID:[id number]   cc:   [name], M.D.

## 2011-05-12 NOTE — Op Note (Signed)
[name], [name]               ACCOUNT NO.:  [id number]   MEDICAL RECORD NO.:  [id number]          PATIENT TYPE:  INP   LOCATION:  5506                         FACILITY:  [facility]   PHYSICIAN:  Jeryl Columbia, M.D.    DATE OF BIRTH:  04-24-1941   DATE OF PROCEDURE:  01/03/2009  DATE OF DISCHARGE:                               OPERATIVE REPORT   PROCEDURE:  EGD with biopsy.   INDICATIONS:  The patient with anemia and guaiac positivity, had been on  some Coumadin and aspirin.  No previous GI workup, no symptoms.  Consent  was signed after risks, benefits, methods, options thoroughly discussed  with both my PA yesterday and with me today prior to procedure.   MEDICINES USED:  Fentanyl 50 mcg, Versed 4 mg.   PROCEDURE:  The video endoscope was inserted by direct vision.  Esophagus was normal.  Scope entered the stomach and no signs of  bleeding were seen and was advanced through a normal antrum, normal  pylorus into the duodenal bulb in the distal part of the bulb was a  small nodule probably due to inflammation and 2 biopsies were obtained  at the end of the procedure.  The scope was advanced around the C-loop.  A normal ampulla was seen and advanced into the second portion of the  duodenum, which was normal.  No blood was seen distally.  Scope was  withdrawn back to the bulb and a good look there confirmed above  appearance and nodule was biopsied at this time.  The scope was  withdrawn back to the stomach and retroflexed.  Cardia, fundus,  angularis, lesser, and greater curve were all normal on retroflex  visualization.  Straight visualization of the stomach did not reveal any  additional findings.  Air was suctioned, scope slowly withdrawn again.  A good look at the esophagus was normal, quick look at the vocal cords  were normal.  Scope was removed.  The patient tolerated the procedure  well.  There was no obvious immediate complication.   ENDOSCOPIC DIAGNOSES:  1. Small distal  bulb nodule probably inflammatory, status post biopsy.  2. Otherwise, normal EGD without any signs of bleeding.   PLAN:  Await pathology.  Continue workup with a colonoscopy tomorrow.  Orders were written.           ______________________________  Jeryl Columbia, M.D.     MEM/MEDQ  D:  01/03/2009  T:  01/04/2009  Job:  [id number]   cc:   Jeryl Columbia, M.D.  Elayne Snare, M.D.

## 2011-05-12 NOTE — Consult Note (Signed)
[name], [name]               ACCOUNT NO.:  [id number]   MEDICAL RECORD NO.:  [id number]          PATIENT TYPE:  INP   LOCATION:  5121                         FACILITY:  [facility]   PHYSICIAN:  James L. Deterding, M.D.DATE OF BIRTH:  09-Aug-1941   DATE OF CONSULTATION:  09/11/2008  DATE OF DISCHARGE:                                 CONSULTATION   REASON FOR CONSULT:  Chronic kidney disease with acute worsening.   HISTORY OF PRESENT ILLNESS:  This is a 70 year old gentleman seen by Dr.  Florene Glen in the past.  He has a history of __________ 20 years history of  diastolic heart failure, history of anemia, __________, obesity,  obstructive sleep apnea, diabetes mellitus, stage IV CKD, his creatinine  __________ with GFR of 15.   HOME MEDICATIONS:  Aspirin 81 mg a day, Vytorin 10/40 once a day, KCl 40  mEq a day, __________, labetalol 400 mg b.i.d., Lasix 160 b.i.d.,  Norvasc 10 mg a day, captopril __________, PhosLo 667 t.i.d., ferrous  sulfate 325 mg a day, Os-Cal 500 t.i.d., __________ 300 mg a day, Megace  20 mg __________ mg a day.   He had presented on September 07, 2008, with right upper quadrant pain  and was found to have acute cholecystitis.  He underwent a laparoscopic  cholecystectomy on September 09, 2008.  Creatinine on admission was  probably __________ his baseline of 3.4 to 3.8, __________.  His next  his creatinine checked was 3.4 and 2.  His creatinine on August 29, 2008, was 3.4; September 09, 2008, was 4.4; and September 11, 2008, was  6.6.  He has had decreased appetite, poor intake, low urine output but  admits he had voiding symptoms, diarrhea, and excess weakness.  He had  no fevers, chills, nausea, vomiting, __________, shortness of breath,  cough, fever, or chills.   REVIEW OF SYSTEMS:  HEENT:  No headaches.  He has had no visual changes,  sore throat, dry eyes, or dry mouth.  GI:  As listed above, he has had  some loose stools.  __________ discomfort in  the right upper quadrant.  GU:  Voiding with some difficulty with a good stream.  No real burning  difficulty or __________.  CARDIOVASCULAR:  He sleeps on 2 pillows.  __________.  SKIN:  Unremarkable.  NEUROLOGIC:  As above.   PAST MEDICAL HISTORY:  __________ diastolic heart failure with EF of  55%, type 2 diabetes, hypertension, __________.   SOCIAL HISTORY:  __________ 34-pack-year history of smoking and quit  smoking several years ago.  He has not drunk any alcohol in 15 years and  does not use drugs.   FAMILY HISTORY:  Mother died of Alzheimer's in her 2s.  Father died at  age 76 __________.  He has a history of leukemia and diabetes in a  sibling.  No coronary artery disease.   ALLERGIES:  No known drug allergies.   OBJECTIVE:  GENERAL:  He is lethargic, falls asleep, difficulty  answering questions, oriented x2.  VITAL SIGNS:  Blood pressure 114/62 __________ sitting.  HEENT:  Diabetic retinopathy __________.  CARDIOVASCULAR:  Regular with an S4.  A 2/6 holosystolic murmur at the  apex.  PMI is 12 cm out from the __________ fifth intercostal space.  Trace 1+ edema.  Pulses are 2+/4+.  No thrills.  No bruits.  LUNGS:  No rales, rhonchi, or wheezes.  Decreased expansion.  Decreased  breath sounds.  Decreased excursion.  ABDOMEN:  Obese.  He does have bowel sounds but are decreased.  His  abdomen is distended.  He has laparoscopic cholecystectomy scars.  NEUROLOGIC:  He is oriented x2.  Falls asleep while talking to him.  Difficulty answering multiple questions.  Difficulty following 2-step  commands.  Cranial nerves II through XII are grossly intact.  Motor is  5/5, symmetric.  Deep tendon reflexes are 1+ to 4+ upper extremities.  Trace patellar and Achilles.  Toes downgoing.   LABORATORY DATA:  Sodium 132, potassium 3.6, chloride 101, bicarb 24,  creatinine 6.6, BUN of 72, glucose 115, hemoglobin 9, on September 13,  white count 7.9, that is 2 days ago.   ASSESSMENT:   1. Chronic kidney disease, mild worsening, __________etiology with      decreased blood pressure, decreased intravascular volume, in a      setting of an ileus, poor intake, renin inhibitors as well as      diuretics which depleted his intravascular volume.  Also,      __________ IV fluids and __________ for inflammatory etiologies.  2. Voiding symptoms, rule out obstruction.  3. Ileus.  4. Diabetes mellitus.  5. Hypertension.  6. __________  7. Obstructive sleep apnea syndrome.  8. Obesity.   PLAN:  1.  2. Ultrasound.  3. Urinalysis.  4. Foley.  5. Urine sent for BUN and creatinine.  6. __________blood pressures.  7. __________   .           ______________________________  Joyice Faster. Deterding, M.D.     JLD/MEDQ  D:  09/11/2008  T:  09/12/2008  Job:  [id number]

## 2011-05-12 NOTE — Discharge Summary (Signed)
NAMEEUEL, TOWNSEND               ACCOUNT NO.:  [id number]   MEDICAL RECORD NO.:  [id number]          PATIENT TYPE:  INP   LOCATION:  [id number]                         FACILITY:  [facility]   PHYSICIAN:  Annita Brod, M.D.DATE OF BIRTH:  Apr 11, 1941   DATE OF ADMISSION:  01/02/2009  DATE OF DISCHARGE:  01/05/2009                               DISCHARGE SUMMARY   PRIMARY CARE PHYSICIAN:  Dr. Elayne Snare.   CONSULTANTS:  1. Dr. Florene Glen, Nephrology.  2. Dr. Watt Climes,  Gastroenterology.   DISCHARGE DIAGNOSES:  1. Syncope.  2. Hypotension felt to be secondary to volume depletion from      hemodialysis and anemia.  3. Anemia felt to be secondary chronic disease.  No signs of active      gastrointestinal bleed at this time.  4. History of atrial fibrillation.  5. History of hypertension.  6. End-stage renal disease on hemodialysis.   HOSPITAL COURSE:  The patient is a 70 year old African American male  with past medical history of end-stage renal disease and paroxysmal A  fib on Coumadin, who had had hemodialysis 1 day prior on the 5th.  At  that time, he went home.  He lives at home by himself, but his daughter  went to check on him and found the patient so weak that every time he  tried to stand he would practically pass out.  She brought him into the  emergency room and when they checked orthostatics, he got significantly  hypotensive and actually passed out in the emergency room, no signs of  any arrhythmia.  However, when lab work came back, the patient's  hemoglobin was found to be around 7.7.  There were no reports of any  black tarry or bright red blood in his stools;  however;  a Hemoccult  test was positive.  This was based on previous hemoglobin checks.  His  hemoglobin had been anywhere from 10-12.  The patient was admitted and  Nephrology was consulted to continue his hemodialysis, which was  scheduled for the following day.  The patient was typed and crossed and  transfused 2  units packed red blood cells and his hemoglobin had  improved following transfusion from 7.1 up to 8.2. By the evening of  hospital day #1, it improved from 7.1 up to 8.2.  By the following  morning, his hemoglobin stayed at 8.5 and by the 8th, his hemoglobin  actually improved on its own up to 9.4.  Dr. Watt Climes from [facility] was  consulted who saw the patient and the patient underwent an endoscopy.  This was done on January 03, 2009.  The EGD was completely normal showing  no signs of any ulceration.  The patient then underwent a Golight[place] prep  and on January 04, 2009, the patient underwent a colonoscopy.  Colonoscopy showed no signs of any bleeding source or ulcerations.  It  did note a few diverticula as well as some polyps which appeared to be  benign.  The patient underwent a polypectomy,  the pathology of which is  pending, but this was not felt to be  a source of bleeding.  It was  suspected that perhaps the patient's anemia may be secondary to chronic  renal disease and this will be continued to be monitored. Given the  patient's stable blood pressure, he is ambulatory now at this point  without difficulty and his hemoglobin has stayed stable, the plan will  be for the patient to be discharged to home on January 05, 2009.  Home  Health has been coming to the house to check on the patient.  They will  continue to do this.  The other issue that remains is that the patient  currently lives at home by himself.  His  daughter has some concerns  that he is starting to get forgetful and at times cannot take care of  himself and she worries about his safety.  The patient himself says that  is fully alert and oriented, able to take his medicines and that his  daughter worries too much.  The patient has said that he does not want  to go to an assisted living facility.  The daughter requested a  Psychiatry consult.  Psychiatry was consulted to see the patient;  however, the patient underwent his  colonoscopy when they came by and the  earliest that they would be able to return would be January 07, 2009.  After discussion with the daughter, the plan will be for the patient to  go home as he appears to be relatively alert and oriented x3 and the  patient can have a competency evaluation with his PCP, he has an  appointment scheduled with Dr. Dwyane Dee on the 11th.   In regard to his medication, the patient will continue all of his  previous medicines with the exception of Coumadin which is currently on  hold. They will plan to restart this medicine in a few weeks.  His other  medicines are:  1. Amiodarone 200 mg p.o. daily.  2. Aspirin 81 p.o. daily.  3. Calcium acetate p.o. daily.  4. Imdur 30 p.o. q.h.s.  5. Lipitor 10 p.o. daily.  6. Metoprolol 25 p.o. t.i.d.  7. Nephro-Vite  These medications are all being continued, Coumadin is on hold.   DISPOSITION:  The patient's overall disposition from initial  presentation is improved.   DISCHARGE ACTIVITIES:  His activity will be to increase activity slowly.   DISCHARGE DIET:  His discharge diet is a renal diet and he is being  discharged to home.   FOLLOW-UP APPOINTMENTS:  With Dr. Dwyane Dee on January 07, 2009 and he will  continue dialysis 3 times a week.  He will receive dialysis today, on  January 05, 2009, prior  to discharge.  Gastroenterology will follow up  on pathology for the patient's polyp.      Annita Brod, M.D.  Electronically Signed     SKK/MEDQ  D:  01/05/2009  T:  01/05/2009  Job:  [id number]   cc:   Elayne Snare, M.D.  Darrold Span. Florene Glen, M.D.  Jeryl Columbia, M.D.

## 2011-05-12 NOTE — Discharge Summary (Signed)
NAMENEYMAR, THEESFELD               ACCOUNT NO.:  [id number]   MEDICAL RECORD NO.:  [id number]          PATIENT TYPE:  INP   LOCATION:  2008                         FACILITY:  [facility]   PHYSICIAN:  Vanna Scotland. Olevia Perches, MD, FACCDATE OF BIRTH:  01/20/1941   DATE OF ADMISSION:  01/25/2008  DATE OF DISCHARGE:  02/01/2008                               DISCHARGE SUMMARY   PRIMARY CARDIOLOGIST:  Dr. Eustace Quail.   NEPHROLOGIST:  Dr. Erling Cruz.   ENDOCRINOLOGIST:  Dr. Elayne Snare.   DISCHARGE DIAGNOSIS:  Acute on chronic diastolic congestive heart  failure.   SECONDARY DIAGNOSES:  1. Type 2 diabetes mellitus.  2. Chronic kidney disease.  3. Hypertension.  4. Obstructive sleep apnea, on continuous positive airway pressure.  5. History of diabetic nephropathy.  6. Hyperlipidemia.  7. History of elevated PSA.  8. Community-acquired pneumonia.  9. Anemia of chronic disease.  10.Secondary hyperparathyroidism.   ALLERGIES:  NO KNOWN DRUG ALLERGIES.   PROCEDURES:  A 2D echocardiogram on January 29 revealing EF of 55%, mild  LVH.   HISTORY OF PRESENT ILLNESS:  A 70 year old African American male with  prior history of poorly-controlled hypertension and chronic diastolic  congestive heart failure, along with chronic kidney disease, followed by  Dr. Florene Glen.  Several days prior to admission, he began to experience  progressive dyspnea on exertion, along with low-grade fever and  productive cough with brown sputum.  On the morning of January 25, 2008,  he presented to urgent care where he was markedly hypertensive with a  blood pressure of 198/91 and hypoxic with an oxygen sat of 90% on room  air.  Transferred to [facility] ED for further evaluation and there  complained of chronic orthopnea, as well as increasing abdominal girth.  His BNP was elevated at 1047.  A chest x-ray showed CHF.  He was  admitted for management of acute on chronic diastolic congestive heart  failure.   HOSPITAL  COURSE:  With his history of productive cough and brown sputum,  he was placed on antibiotics, as well as IV Lasix given his heart  failure.  His creatinine on admission was 3.0 and because he has a  history of chronic kidney disease nephrology was consulted.  With  additional IV diuresis, he gradually improved and was eventually  switched from IV to oral Lasix.  With continued diuresis, his creatinine  rose from 3.1 on admission into the low 4's.  It was felt that he would  likely require dialysis in the future and therefore upper extremity vein  mapping was performed bilaterally.  Patient was also evaluated by  vascular surgery and it was felt that patient would at some point be a  candidate for a left arm AV graft.  Decision was made to defer placement  at this time.   Mr. Urbanowicz has remained stable and has had significant improvement in  breathing with reduction of lower extremity edema.  He is being  discharged home today in satisfactory condition.   DISCHARGE LABS:  Hemoglobin 9.3, hematocrit 27.5, WBC 8.4, platelets  286, MCV 86.9, sodium  141, potassium 4.3, chloride 106, CO2 of 27, BUN  61, creatinine 4.09, glucose 78, INR 1.3, total bilirubin 1.0, alkaline  phosphatase 113, AST 22, ALT 20, albumin 2.0, CK 544, MB 7.6, troponin I  0.09, total cholesterol 199, triglycerides 73, HDL 48, LDL 136, calcium  8.2, magnesium 5.7, parathyroid hormone 273.4, ferritin 99, serum iron  22, TIBC 245, reticulocyte count 1.8, TSH 2.725, D-dimer 1.23.  BNP on  admission 1047.0.   DISPOSITION:  Patient is being discharged home today in good condition.   FOLLOWUP AND APPOINTMENTS:  We have arranged for followup in [facility] with Sharyn Lull __________ , nurse  practitioner, on February 11 at 3:30 p.m.  He is to follow up with Dr.  Olevia Perches on March 19 at 2:45 p.m.  He is also to follow up with Dr. Florene Glen  in 1 to 2 weeks.   DISCHARGE MEDICATIONS:  1. Aspirin 81 mg  daily.  2. Vytorin 10/40 mg q.h.s.  3. Amaril 4 mg daily.  4. Potassium chloride 40 mEq daily.  5. Labetalol 400 mg b.i.d.  6. Lasix 160 mg b.i.d.  7. Norvasc 10 mg daily.  8. Hectorol 0.5 mcg daily.  9. PhosLo 667 mg t.i.d. with meals.  10.Ferrous sulfate 325 mg daily.   Outstanding labs and studies duration of this encounter was 45 minutes  including physician's time.Marland Kitchen   DISCHARGE ENCOUNTER:  45 minutes including physician time.      Murray Hodgkins, ANP      Bruce R. Olevia Perches, MD, [facility]  Electronically Signed    CB/MEDQ  D:  02/01/2008  T:  02/02/2008  Job:  [id number]

## 2011-05-12 NOTE — Assessment & Plan Note (Signed)
[facility]                          CHRONIC HEART FAILURE NOTE   [name]                      MRN:          [id number]  DATE:03/05/2008                            DOB:          [birth year]    NEPHROLOGIST:  Darrold Span. Florene Glen, M.D.   PRIMARY CARE PHYSICIAN:  Elayne Snare, M.D.   PRIMARY CARDIOLOGIST:  Vanna Scotland. Olevia Perches, MD, [facility]   [name] returns today for further followup of his congestive heart  failure which is secondary to diastolic dysfunction.  I recently saw Mr.  [name] as a new patient back in February [name] also has a history  of renal insufficiency and is being followed by Dr. Florene Glen. He states he  saw Dr. Florene Glen last Friday.  A large amount of blood work was drawn he  has a followup appointment with him this Friday for further  consideration of graft placement.  [name] weight is up 6 pounds  today from a month ago, but he denies any orthopnea or PND.  Does have  noted peripheral edema.  He states he feels like he has some fluid  around his abdominal and flank area also.  He sleeps with his CPAP on at  night. Denies any orthopnea or PND.  States he has been resting well.  He denies any shortness of breath and has some mild dyspnea on exertion,  but states overall his breathing is actually better than it has been in  the past. States compliance with medications although he is not taking  any of his medicines yet this morning. Also has an appoint with Dr.  Dwyane Dee on the 31st of this month for further reevaluation of his  diabetes.  [name] states his CBG was 111 this morning. The only  thing that he has noted any difference in other than stated above is he  does not have has frequent urination now after he takes his Lasix. It  feels like there is a decrease in his urine output. Also, the only  reason he has not taken his medicines yet this morning is that he has  some mild dizziness if he takes them and then tries  to drive. I received  a copy of lab work for [name], testing that was done  on March 02, 2008, showed glucose of 128, a BUN 38, creatinine of 3.8,  and a potassium of 4.0.   PAST MEDICAL HISTORY:  1. 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  2. Type 2 diabetes.  3. Chronic kidney disease followed by Dr. Florene Glen.  4. Obstructive sleep apnea with continuous positive airway pressure      compliance, followed by Dr. Annamaria Boots.  5. Diabetic nephropathy.  6. Hyperlipidemia.  7. History of elevated PSA.  8. Recent treatment for community acquired pneumonia.  9. Anemia of chronic disease.  10.Secondary hyperparathyroid.  11.Remote history of noncompliance.   REVIEW OF SYSTEMS:  As stated above, otherwise negative.   CURRENT MEDICATIONS:  1. Aspirin 81.  2. Vytorin 10/40.  3. Amaryl 4  mg daily.  4. Potassium 40 mEq daily.  5. Labetalol 400 mg b.i.d.  6. Lasix 160 mg b.i.d.  7. Norvasc 10 mg daily.  8. Hectorol  0.5 mg daily.  9. PhosLo 667 mg t.i.d.  10.Ferrous sulfate 325 mg daily.  The patient states he was recently started on a medicine for his BPH he  thinks it is Uroxatral.   No known allergies at this time.   PHYSICAL EXAM:  Weight 198 pounds, weight is up 6 pounds from a month  ago, blood pressure 159/74; manual blood pressure left arm 160/80. Note,  the patient has not taken his blood pressure medicines yet this morning.  A heart rate 71.  [name] is in no acute distress.  He has JVD around 10 cm at a 45 degrees angle.  LUNGS:  Clear to auscultation bilaterally.  CARDIOVASCULAR: Exam reveals S1-S2, regular rate and rhythm.  He has a  3/6 systolic ejection murmur noted.  ABDOMEN:  Obese, soft, nontender, positive bowel sounds.  LOWER EXTREMITIES:  The patient has +3 pitting edema bilaterally.  NEUROLOGICAL: Alert and oriented x3.   IMPRESSION:  Congestive heart failure secondary to diastolic  dysfunction  with volume overload at this time without increased shortness of breath.  [name] is also pending further evaluation by Dr. Florene Glen for  hemodialysis consideration. I will go ahead and add Zaroxolyn 2.5 mg  p.o. q. a.m. prior to his morning doses of Lasix for the next 3 days and  have him double his  potassium to b.i.d. for the next 3 days. The patient states he has an  appointment Dr. Florene Glen on Friday and I will plan on seeing [name]  back in 3-4 weeks for followup.      Rosanne Sack, ACNP  Electronically Signed      Shaune Pascal. Bensimhon, MD  Electronically Signed   MB/MedQ  DD: 03/05/2008  DT: 03/05/2008  Job #: [id number]   cc:   Darrold Span. Florene Glen, M.D.  Elayne Snare, M.D.

## 2011-05-12 NOTE — Procedures (Signed)
CEPHALIC VEIN MAPPING   INDICATION:  End-stage renal disease.  Patient is not currently on  dialysis.   HISTORY:   EXAM:   The right cephalic vein was not evaluated.   The left cephalic vein is compressible.   Diameter measurements range from 0.31 to 0.26 cm in the forearm from the  wrist to the antecubital fossa.  In the upper arm, the left cephalic  vein  measures 0.3 to 0.09 cm.  With the distal upper arm portion, the  vein appears to be too small to use as part of an AV fistula.   See attached worksheet for all measurements.   IMPRESSION:  Patent left cephalic vein which is of acceptable diameter  for use as a dialysis access site in the forearm only.   ___________________________________________  V. Leia Alf, MD   MC/MEDQ  D:  03/19/2008  T:  03/19/2008  Job:  [id number]

## 2011-05-12 NOTE — Assessment & Plan Note (Signed)
[facility]                          CHRONIC HEART FAILURE NOTE   STAN, SHACKLEY                      MRN:          [id number]  DATE:07/31/2008                            DOB:          06-Jun-1941    PRIMARY CARDIOLOGIST:  Vanna Scotland. Olevia Perches, MD, [facility]   NEPHROLOGIST:  Darrold Span. Florene Glen, MD   HISTORY OF PRESENT ILLNESS:  [name] returns today for further  followup of his congestive heart failure secondary to diastolic  dysfunction with a normal EF, confirmed by echocardiogram.  Udell  unfortunately has renal insufficiency which is followed by Dr. Florene Glen.  He is status post recent placement of AV fistula in his left upper  extremity.  Most recent blood work checked on July 03, 2008, here at our  office shows a creatinine of 4.8, potassium was 4.1.  Sajad states he  has followed up with Dr. Florene Glen since that time.  Per Ryane's report his  potassium was low and he states that he is currently taking 20 mEq  tablets of potassium 2 tablets 4 times a day.  He states he has a  followup appointment with Dr. Florene Glen next week.  He states compliance  with the Lasix 160 mg b.i.d. and metolazone 2.5 mg daily, but he  complains of increased shortness of breath with minimal exertion.  He  denies orthopnea or PND.  He states his compliance with the CPAP.  He  states his weight at home has been baseline is 197, yesterday was 200,  today he is up 12 pounds here from weight recorded back in July 2009.  He states he is not eating anymore than he usually does.  In fact, he  states he is eating less because he has more abdominal fullness.   PAST MEDICAL HISTORY:  1. 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  2. Chronic kidney disease.  3. Type 2 diabetes.  4. Obstructive sleep apnea with CPAP use.   REVIEW OF SYSTEMS:  As stated above and otherwise negative.   CURRENT MEDICATIONS:  1. Amaryl 2 mg daily.  2. Guaifenesin daily.  3. Uroxatral  daily.  4. Multivitamin daily.  5. Tekturna 100 mg daily.  6. Iron daily.  7. Ginkgo biloba daily.  8. Aspirin 81 daily.  9. Vitamin D.  10.Diazepam 5 mg daily.  11.Amlodipine 10 mg daily.  12.Calcium 3 tablets daily.  13.Vytorin 10/20 daily.  14.Metolazone 2.5 daily.  15.Allopurinol 100 daily.  16.Labetalol 300 mg 2 tablets b.i.d.  17.Lasix 160 mg b.i.d.  18.Per the patient's report he has taken KCl 40 mEq q.i.d.  19.P.r.n. medications include a sleeping pill prescribed by Dr.      Dwyane Dee.   PHYSICAL EXAMINATION:  VITAL SIGNS:  Weight today 217 pounds weight is  up to 12 pounds, blood pressure 160/80 with a heart rate of 82.  Rana  is in no acute distress at rest.  JVD around 8 cm at 45 degrees angle.  LUNGS:  Clear to auscultation.  CARDIOVASCULAR:  S1 and S2, 2/6 systolic ejection murmur noted.  ABDOMEN:  Soft, nontender, positive bowel sounds,  obese.  LOWER EXTREMITIES:  With +1 to 2 nonpitting edema bilaterally.  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  I have increased his  metolazone to 5 mg daily.  He has taken 30 minutes before his a.m. dose  of Lasix.  I am going to go ahead and check blood work today and I will  fax results of blood work to Dr. Florene Glen.  I encouraged Mariusz to keep his  appointment with Dr. Florene Glen next week.      Rosanne Sack, ACNP  Electronically Signed      Minus Breeding, MD, [facility]  Electronically Signed   MB/MedQ  DD: 07/31/2008  DT: 08/01/2008  Job #: [id number]

## 2011-05-12 NOTE — Assessment & Plan Note (Signed)
[facility]                          CHRONIC HEART FAILURE NOTE   KENN, SCHNOOR                      MRN:          [id number]  DATE:07/03/2008                            DOB:          02-Feb-1941    PRIMARY CARDIOLOGIST:  Vanna Scotland. Olevia Perches, MD, [facility]   NEPHROLOGIST:  Darrold Span. Florene Glen, MD   HISTORY OF PRESENT ILLNESS:  [name] returns today for further  followup, apparently called the office this morning complaining of  increased cramps in the legs.  Actually, he saw me in early June.  Since  that appointment, he has followed up with Dr. Olevia Perches.  Apparently, he  had blood work drawn on June 05, 2008, when I saw him.  That blood work  drawn on June 05, 2008 shows a potassium of 3, BUN and creatinine of 55  and 3.8, and a BNP of 362.  Note, I do not have access to Mr. Venteicher'  permanent chart here in the office at this time.  I do not know how that  potassium was addressed.  Since that time, the patient has followed up  with Dr. Olevia Perches on June 21, 2008.  At that visit, the patient states he  was taking K-Dur 20 mEq 2 tablets b.i.d., Lasix 80 mg 2 tablets b.i.d.,  and metolazone 2.5 mg daily.  When Dr. Olevia Perches saw the patient, the  patient was still in volume overload, and he increased his Lasix from 80  mg 2 tablets b.i.d. to 2 tablets three times a day, this is equal to 160  mg t.i.d.  He instructed him to do this until his weight decreased 10  pounds on his home scale, which would be from 200 to 190 pounds.  I do  not see that any changes were made in his potassium supplement, and I do  not see that any blood work was drawn at that time.  The last blood work  in this E-chart is shown up for June 05, 2008, at which time, his  potassium was 3.  Mr. Segrest states he has been taking the Lasix as  instructed.  He also states he has been taking some extra potassium  because he has had such severe leg cramps at night.  He has also had leg  cramps during  the day when he is walking.  He states they have been so  painful that it is just about makes him fall.  He denies any orthopnea  or PND.  He states compliance with his CPAP.  Otherwise, he states he  feels okay.   PAST MEDICAL HISTORY:  1. 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  2. Chronic kidney disease, followed by Dr. Florene Glen, status post      placement of AV fistula left upper extremity.  3. Type 2 diabetes.  4. Obstructive sleep apnea with CPAP compliance.  5. Diabetic nephropathy.  6. Hyperlipidemia.  7. History of dietary indiscretion.  8. Anemia of chronic disease.  9. Secondary hyperparathyroidism.  10.History of elevated PSA.  11.Per the  patient's report, he is on the kidney transplant list under      the instruction of Dr. Florene Glen.   REVIEW OF SYSTEMS:  As stated above, otherwise negative.   CURRENT MEDICATIONS:  1. Metolazone 2.5 daily.  2. Lasix 160 mg t.i.d.  3. K-Dur 40 mEq b.i.d.  The patient also states he is taking p.r.n.      doses.  4. Allopurinol 100 mg daily.  5. Labetalol 300 mg.  The patient takes 2 tablets b.i.d.  6. Glimepiride 2 mg daily.  7. Guaifenesin daily.  8. Uroxatral daily.  9. Multivitamin daily.  10.Tekturna 100 mg daily.  11.Iron daily.  12.Ginkgo biloba daily.  13.Aspirin 81 daily.  14.Actos 15 daily.  15.Vitamin D every 2 weeks.  16.Diazepam 5 mg daily.  17.Amlodipine 10 mg daily.  18.Calcium 3 tablets daily.  19.Allegra-D and Vytorin 10/20 mg daily.   P.R.N. MEDICATIONS:  K-Dur 20-mEq tablet.   REVIEW OF SYSTEMS:  As stated above, otherwise negative.   PHYSICAL EXAMINATION:  VITAL SIGNS:  Weight today is 205 pounds.  The  patient's weight is down 5 pounds from last week.  GENERAL:  [name] is in no acute distress.  NECK:  JVD around 8 cm at 45-degree angle.  LUNGS:  Clear to auscultation bilaterally.  CARDIOVASCULAR:  Reveals Q000111Q, 2/6 systolic ejection murmur noted.   ABDOMEN:  Soft, nontender.  Positive bowel sounds.  Protuberant.  EXTREMITIES:  On the left upper extremity, the patient has graft to left  forearm.  Positive thrill.  Positive bruits.  Lower extremities with 1+  nonpitting edema bilaterally with the left being greater than the right.  NEUROLOGIC:  Alert and oriented x3.   IMPRESSION:  Congestive heart failure secondary to diastolic  dysfunction.  The patient still with mild volume overload in the setting  of chronic kidney disease.  I am going to decrease his Lasix to 160 mg  b.i.d., increase his K-Dur to 40 mEq t.i.d., and check blood work today.  I  suspect that Mr. Lilly is still hypokalemic.  We will continue other  medications at current dosing.  I will see the patient back in 3 weeks  for further followup.      Rosanne Sack, ACNP  Electronically Signed      Minus Breeding, MD, [facility]  Electronically Signed   MB/MedQ  DD: 07/03/2008  DT: 07/04/2008  Job #: [id number]   cc:   Darrold Span. Florene Glen, M.D.

## 2011-05-12 NOTE — Consult Note (Signed)
NAMEJERMARION, HAVERTY               ACCOUNT NO.:  [id number]   MEDICAL RECORD NO.:  [id number]          PATIENT TYPE:  INP   LOCATION:  5506                         FACILITY:  [place]   PHYSICIAN:  Jeryl Columbia, M.D.    DATE OF BIRTH:  1941/07/24   DATE OF CONSULTATION:  01/02/2009  DATE OF DISCHARGE:                                 CONSULTATION   We were asked to see Mr. Carranco today in consultation for possible GI  bleed by Dr. Gevena Barre of [facility].   Mr. Dupriest is a 70 year old male with a past medical history  significant for end-stage renal disease, on hemodialysis Tuesday,  Thursday, Saturday.  He also has diastolic heart failure, diabetes  mellitus, and paroxysmal atrial fibrillation.  He is on Coumadin, has an  INR 1.3.  His daughter went to visit him this morning, found him very  weak and near falling when he tried to stand.  She brought him to the  emergency room.  He was found to be orthostatic with a hemoglobin of  7.1.  His last hemoglobin on record was December 14, 2008, that was 12.6  by [facility].  He was also found to be guaiac positive with black  stools that was not frankly melanotic but was grossly guaiac positive.  Mr. Linares appears very stable.  He denies any frank blood in his  stool, changes in bowel movements, vomiting, abdominal pain, or NSAID  use.  He does endorse regular GERD symptoms.  He tells me that he has  never had a colonoscopy or an endoscopy.   Past medical history is significant for:  1. End-stage renal disease, on hemodialysis.  2. Diastolic heart failure.  3. Diabetes mellitus.  4. Hypertension.  5. Sleep apnea.  6. Paroxysmal atrial fibrillation.   CURRENT MEDICATIONS:  Amiodarone 81 mg, aspirin, Coumadin, calcium,  Imdur, Lipitor, metoprolol.   He has no known drug allergies.   Social history is negative for current tobacco or alcohol or drug use.  He does live alone.   Family history was  noncontributory.   PHYSICAL EXAMINATION:  GENERAL:  He is alert and oriented, pleasant to  speak with, lying in his bed in the ED department.  ABDOMEN:  Soft, nontender, nondistended with regular bowel sounds.  LUNGS:  No wheezes or crackles.   Lab work is significant for white count of 11.4, hemoglobin of 7.1 with  an MCV value of 94, platelet count 202.  INR is 1.3.  Chest x-ray shows  no acute disease.   ASSESSMENT:  This is a very pleasant 70 year old African American  gentleman with end-stage renal disease as well as diastolic heart  failure who appears to be experiencing a gastrointestinal bleed,  probably an upper gastrointestinal bleed.  He will be admitted this  evening by the [facility].  He has already been seen by  the [facility].  He will receive packed red blood cells and fluids.  We will start him on a clear liquid diet and plan to do an upper  endoscopy on Thursday and pending those  results, they may forward with a  colonoscopy on Friday.   Thanks very much for this consultation.      Melton Alar, PA    ______________________________  Jeryl Columbia, M.D.    MLY/MEDQ  D:  01/03/2009  T:  01/03/2009  Job:  [id number]   cc:   Elayne Snare, M.D.  Darrold Span. Florene Glen, M.D.

## 2011-05-12 NOTE — Assessment & Plan Note (Signed)
OFFICE VISIT   [name], [name]  DOB:  June 21, 1941                                       01/30/2009  [id number]   The patient is a 70 year old male with end-stage renal disease on  hemodialysis access.  He was sent for evaluation for revision of his  access by Dr. Mercy Moore.  He had a thrombectomy procedure in radiology  on January 15.  At this time it showed that he had a long segment of  irregular vein above the anastomosis and it was suggested that if the  graft reoccluded he would need surgical revision to get above this area  of thickening.   The patient states that his dialysis has been going okay since that  thrombectomy procedure.  He has had no further episodes of thrombosis  since then.  He has no evidence of steal in his arm.   MEDICATIONS:  1. Include amiodarone 200 mg once a day.  2. Prandin one b.i.d.  3. Lopressor 40 mg one twice daily except hold on hemodialysis days.  4. Lipitor 40 mg once a day.  5. Imdur 30 mg once a day.  6. Tylenol p.r.n.  7. PhosLo 667 mg one to two per day.  8. Nephro-Vite once daily.  9. Uroxatral 10 mg once a day.  10.Calcium 800 mg once a day.  11.Aspirin 81 mg once a day.  12.Vitamin D once a day.  13.Amaryl 2 mg once a day.  14.Actos 50 mg once a day.   PAST MEDICAL HISTORY:  Past medical history is remarkable for  hypertension, diabetes, pneumonia and congestive heart failure.   PHYSICAL EXAM:  He has a left forearm AV graft.  This was initially  placed in April of 2009.  There is an audible bruit in the graft.  There  is no distinctly palpable thrill.  He has no palpable radial pulse at  the wrist.   Review of his shuntogram from January 15 was performed.  There are two  films during the shuntogram which show balloon inflations but it is  difficult to assess exactly how far up the area of irregularity in the  vein proceeds.  I believe the best option is to repeat his shuntogram to  make sure  that there is a reasonable segment of vein to revise rather  than considering an upper arm graft as his next procedure.  We have  scheduled his shuntogram for 02/01/2009.  The procedure risks, benefits,  possible complications were explained to the patient.  He understands  and agrees to proceed.  If his shuntogram shows a reasonable segment of  vein to revise we would consider doing this on the following Monday or  Wednesday.   Jessy Oto. Fields, MD  Electronically Signed   CEF/MEDQ  D:  01/30/2009  T:  01/31/2009  Job:  [id number]   cc:   Windy Kalata, M.D.

## 2011-05-12 NOTE — Op Note (Signed)
NAMEOKLEY, MCGAREY               ACCOUNT NO.:  [id number]   MEDICAL RECORD NO.:  [id number]          PATIENT TYPE:  AMB   LOCATION:  SDS                          FACILITY:  [facility]   PHYSICIAN:  Jessy Oto. Fields, MD  DATE OF BIRTH:  1941-07-01   DATE OF PROCEDURE:  02/01/2009  DATE OF DISCHARGE:  02/01/2009                               OPERATIVE REPORT   PROCEDURE:  Left arm shuntogram.   PREOPERATIVE DIAGNOSIS:  Venous outflow stenosis.   POSTOPERATIVE DIAGNOSIS:  Venous outflow stenosis.   ANESTHESIA:  Local.   OPERATIVE FINDINGS:  Irregularity of endothelial wall of deep brachial  vein left arm with no flow-limiting stenosis.   OPERATIVE DETAILS:  After obtaining informed consent, the patient was  brought to the [facility].  The patient was placed in supine position on the  angio table.  Local anesthesia was infiltrated over the venous limb of  the left forearm AV graft.  Next, a micropuncture set was used to  selectively catheterize the venous limb of the graft.  A 5-French  introducer sheath was then placed over a guidewire into the graft.  Shuntogram was then obtained of the venous outflow and arterial inflow.  It should be noted that the patient had a central venogram in mid  January that showed no flow-limiting stenosis of the central veins.   After placing the micropuncture needle, the outflow was first studied on  the venous limb.  The venous anastomosis is widely patent.  There is  some irregularity of the brachial outflow vein in the proximal third of  the arm.  The vein is then of better quality distally.  There was a  large side branch at the level of the proximal third of the humerus  which has some stenosis but there is some flow through this vein as  well.   Pressure was then held on the venous limb and an arterial phase study  was performed and there was good reflux of contrast across the arterial  anastomosis.  There was reflux of contrast into the main  brachial artery  proximally and distally.  There was no flow-limiting stenosis throughout  the course of the graft.  There was no significant graft degeneration.   Next, the micropuncture sheath was removed and hemostasis obtained with  direct pressure.  The patient tolerated the procedure well and there  were no complications.  The patient was taken to the holding area in  stable condition.     Jessy Oto. Fields, MD  Electronically Signed    CEF/MEDQ  D:  02/01/2009  T:  02/02/2009  Job:  [id number]   cc:   Windy Kalata, M.D.

## 2011-05-12 NOTE — H&P (Signed)
[name], [name]               ACCOUNT NO.:  [id number]   MEDICAL RECORD NO.:  [id number]          PATIENT TYPE:  INP   LOCATION:  5121                         FACILITY:  [facility]   PHYSICIAN:  Mare Loan, MD      DATE OF BIRTH:  28-Aug-1941   DATE OF ADMISSION:  09/07/2008  DATE OF DISCHARGE:                              HISTORY & PHYSICAL   CHIEF COMPLAINT:  Abdominal pain.   HISTORY OF PRESENT ILLNESS:  [name] is a 70 year old male who  apparently began to have abdominal pain on Wednesday.  He describes the  pain located in the right upper quadrant.  It does radiate to his back.  He also has some pain in his right side of his abdomen overall.  He has  had no nausea or vomiting.  No fevers.  He does describe the pain as  being constant pain.  It does radiate to his back.  He persistently  continued to have pain over the past few days and now went to his  primary care physician's office, and he was sent to emergency  department.  He has had no previous episodes of abdominal pain at this  time.  His CT scan was performed for evaluation did have some mild  haziness around the gallbladder.  No fluid.  No other findings were  found.  He also had an ultrasound, which revealed some wall thickening,  but no fluid and cholelithiasis was present.  His white count mainly  elevated at 14.  Hemoglobin and hematocrit is 9.6 and 29.7.  Alkaline  phosphatase is slightly elevated at 128.  AST and ALT are normal at 27  and 19 respectively.   PAST MEDICAL HISTORY:  Significant for congestive heart failure,  diabetes, hypertension, renal insufficiency, apparently history of  mycoardial infarction, hypercholesteremia, diabetic neuropathy, and  sleep apnea.   FAMILY HISTORY:  Unknown.   SOCIAL HISTORY:  No tobacco or alcohol use.   ALLERGIES:  No known drug allergies.   MEDICATIONS:  Actos 15 mg once a day, allopurinol 100 mg once a day,  amlodipine 5 mg twice a day, glimepiride 2 mg  every day, furosemide 80  mg once a day, vitamin D, calcium, aspirin, labetalol 600 mg twice a  day, metolazone 2.5 once a day, potassium chloride, multivitamin,  Vytorin 10/20 every day, Valium 5 p.r.n., and Tekturna 100 mg once a  day.   REVIEW OF SYSTEMS:  Obtained from the patient and the patient's  emergency room chart.  He does not describe any chest discomfort.  He  does describe some radiation of pain to his chest from his abdomen.  No  shortness of breath.  He does have feet swelling on daily basis.  He  does urinate, but has chronic renal insufficiency.  All other systems  are negative.   PHYSICAL EXAMINATION:  VITAL SIGNS:  Temperature 98.0, pulse 72, blood  pressure 171/72.  GENERAL:  Overall, he is well-developed obese male in no acute distress  appears as stated age.  HEENT EXAM:  He wears corrective lenses.  Extraocular muscles are  intact.  HEAD:  Normocephalic.  CHEST:  Clear to auscultation bilaterally.  CARDIOVASCULAR  EXAM:  Reveals regular rate and rhythm.  He does have  murmur systolic on examination.  ABDOMEN:  Obese and is tender mostly in the right upper quadrant and  some on the right side.  No peritoneal signs.  EXTREMITIES:  No pitting edema is noted.  SKIN:  He does have chronic skin changes on his right lower extremity.  NEUROLOGIC:  Cranial nerves II-XII are grossly intact.  No focal  deficits were found.   LABS:  As dictated.   IMPRESSION AND PLAN:  1. Cholecystitis likely subacute on acute.  2. Congestive heart failure.  3. Diabetes.  4. Hypertension.   PLAN:  To admit with IV antibiotics with Zosyn renal dose.  I discussed  with [name], he would benefit from the cholecystectomy given  his clinical scenario.  I have called [facility], Dr. Olevia Perches is  his Cardiologist due to his congestive heart failure to see what his  operative risk would be.  There is a chance we might be able to just  treat him with antibiotics, and no  surgery would be needed if this  indeed did pose him as a severe risk to undergo general anesthesia.  We  will cover with the insulin for his diabetic diet tonight and then  n.p.o. with coverage for the insulin.  Continue his antihypertensives  and have his CPAP due to sleep apnea.      Mare Loan, MD  Electronically Signed     ALA/MEDQ  D:  09/07/2008  T:  09/08/2008  Job:  [id number]   cc:   Vanna Scotland. Olevia Perches, MD, [facility]  Dr. Dwyane Dee

## 2011-05-12 NOTE — Consult Note (Signed)
NAMESEBASTAIN, ROEBKE               ACCOUNT NO.:  [id number]   MEDICAL RECORD NO.:  [id number]          PATIENT TYPE:  INP   LOCATION:  2008                         FACILITY:  [facility]   PHYSICIAN:  Windy Kalata, M.D.DATE OF BIRTH:  1941-04-05   DATE OF CONSULTATION:  01/27/2008  DATE OF DISCHARGE:                                 CONSULTATION   REFERRING PHYSICIAN:  Minus Breeding, MD, [facility].   REASON FOR CONSULTATION:  Acute-on-chronic renal insufficiency.   HISTORY OF PRESENT ILLNESS:  This is a 70 year old white male admitted  on January 25, 2008 for shortness of breath and chest pain.  In  addition, he had noticed  increased peripheral edema over several days  prior to admission.  He has had chronic kidney disease secondary to  diabetes and hypertension and had been followed by Dr. Florene Glen in our  clinic.  He says he was told he would need to come back although his  last appointment was a missed appointment.  His serum creatinine in  October 2005 was 2.1, in March 2006 was 2.4, June 2006 was 2.3, December  2007 was 2.9, January 25, 2008 was 3.1, on January 27, 2008 was 3.6.  There are no Is and Os recorded in the chart though he says he is  diuresing well with the IV Lasix.  He has had diabetes for approximately  22 years and hypertension for 22 years.   PAST MEDICAL HISTORY:  Significant for diabetes, hypertension, diastolic  dysfunction and sleep apnea.   ALLERGIES:  None.   MEDICATIONS:  Currently include Avandia 2 mg a day, Lasix 80 mg b.i.d.,  aspirin 81 mg a day, Vytorin 10/40 mg daily, Amaryl 4 mg a day, Norvasc  10 mg a day, hydralazine 25 mg t.i.d., labetalol 400 mg b.i.d., Rocephin  1 gram IV q.12 h, potassium chloride 40 mEq b.i.d., Lasix 80 mg IV  b.i.d.   SOCIAL HISTORY:  He is an ex-smoker and lives in [place].  He is  retired from [employer].   FAMILY HISTORY:  Mother died of complications of Alzheimer's.  Father  died of  unknown reasons.  He has  one son with diabetes.  No family  history of renal disease.   REVIEW OF SYSTEMS:  Appetite is good.  Energy level is good.  His  breathing has improved.  He denies any recent change in bowel habits.  No dysuria.  No new arthritic complaints.  He does have some numbness in  his hands and feet.  The rest of the review of systems unremarkable.   PHYSICAL EXAMINATION:  VITAL SIGNS:  Blood pressure 189/91, pulse 80,  temperature 98.5.  GENERAL:  A 70 year old black male in no acute stress.  HEENT:  Sclerae nonicteric.  Extraocular muscles are intact.  NECK:  Reveals no JVD.  LUNGS:  Clear to auscultation.  HEART:  Regular rate and rhythm without murmur, rub or gallop.  ABDOMEN:  Positive bowel sounds, nontender, nondistended.  No splenomegaly.  No  bruits.  EXTREMITIES:  There is 1 to 2+ edema.  NEUROLOGIC:  Cranial nerves are intact.  He is oriented x3.  No  asterixis.  Decreased sensation in both feet.  Peripheral pulses are not  palpable secondary to edema.   LABORATORY DATA:  Sodium 145, potassium 3.6, bicarb 29, BUN 42,  creatinine 3.6, hemoglobin 10.5, white count 9.6.   IMPRESSION:  1. Chronic kidney disease secondary to diabetes and hypertension with      slight increase in serum creatinine in the face of diuresis.  2. Hypertension.  3. Diabetes.  4. Sleep apnea.  5. Anemia.   RECOMMENDATIONS:  1. Most important aspect right now is getting better control of his      volume.  He says he is diuresing well with the current dosage of      Lasix,  but we certainly need to get more accurate Is and Os and      daily weights to verify this.  I am not opposed to using an ACE      inhibitor or ARB at some point but I would diurese him first.  2. We will check iron studies because of his anemia.  3. We will check a PTH level.  4. We will check phosphorus level.  5. Dietary education.  6. We will check a urine culture due to the pyuria.   Thank you very much for the consult.  We  will follow the patient with  you.           ______________________________  Windy Kalata, M.D.     MTM/MEDQ  D:  01/27/2008  T:  01/27/2008  Job:  [id number]

## 2011-05-12 NOTE — Assessment & Plan Note (Signed)
[facility]                          CHRONIC HEART FAILURE NOTE   DEMARIE, ZEHR                      MRN:          [id number]  DATE:02/08/2008                            DOB:          Sep 26, 1941    PRIMARY CARDIOLOGIST:  Dr. Eustace Quail.   PULMONARY:  Dr. Annamaria Boots.   RENAL:  Dr. Erling Cruz.   PRIMARY CARE PHYSICIAN:  Dr. Darrow Bussing.   Mr. Misko is new to the heart failure clinic.  He was referred here by  Dr. Olevia Perches, his primary cardiologist.  He is a very pleasant 70 year old  African-American gentleman with a history of congestive heart failure  which is diastolic in nature.  A 2-D echocardiogram in January of this  year showed a EF of 55% with mild LVH.  Mr. Butler is a very pleasant  gentleman.  He lives here in [place] alone.  He had a daughter who is  very attentive and checks on him frequently.  He is retired from the  [employer].  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  He was just discharged from [facility] on February 4, where he was admitted at that time for acute on-  chronic diastolic congestive heart failure.  He has known history of  chronic kidney disease followed by Dr. Florene Glen, is actually pending a  follow-up appointment with Dr. Florene Glen soon, for consideration of  hemodialysis catheter placement.  Mr. Muenchow states that he is not too  excited about having a dialysis treatment done, as he has had several  family members who have had renal failure.  He also has a history of  obstructive sleep apnea, followed by Dr. Annamaria Boots for this and hypertension  which has been poorly controlled in the past, along with diabetes.  Mr.  Latney states he never realized the seriousness of his illnesses, until  he became so sick and it affected his quality of life, and now he  reports that he is taking any medical care seriously and plans on doing  everything he can to continue  living independently.  He states he has  been doing okay, since he was discharged home, compliance with  medications.  He has a sedentary lifestyle, states he has not really  done too much since he went home.  His daughter has been assisting with  his household chores and some cooking.  He is driving, denies any pre-  syncope or syncopal episodes, lightheadedness, or dizziness.  On second  note, he does complain of mild dizziness, if he stands too quickly.  He  notices this mostly within an hour or so after taking his medications.   PAST MEDICAL HISTORY:  Includes:  1. Congestive heart failure, secondary to diastolic dysfunction with      EF 55%.  2. Type 2 diabetes.  3. Chronic kidney disease, followed by Dr. Florene Glen.  Type 2 diabetes is      followed by Dr. Dwyane Dee.  4. Obstructive sleep apnea with CPAP compliance, followed by Dr.      Annamaria Boots.  5. Diabetic nephropathy.  6. Hyperlipidemia.  7. History of elevated PSA.  8. Recent community-acquired pneumonia.  9. Anemia of chronic disease.  10.Secondary hyperparathyroidism.  11.History of medical noncompliance.  12.Remote history of Bell's palsy.  13.A history of tobacco use.   REVIEW OF SYSTEMS:  As stated above, otherwise negative.   CURRENT MEDICATIONS:  Include:  1. Aspirin 81.  2. Vytorin 10/40.  3. Amaryl 4.  4. Potassium 40 labetalol 400 b.i.d.  5. Lasix 160 mg b.i.d.  6. Norvasc 10 mg daily.  7. Hectorol 0.5 mg daily.  8. PhosLo 667 mg t.i.d.  9. Ferrous sulfate 325 mg daily.   PHYSICAL EXAM:  VITAL SIGNS:  Weight 192.  The patient's weight is  baseline, weight at home runs between 185 and 188, blood pressure 108/55  with heart rate of 73.  GENERAL:  Mr. Yousuf is in no acute distress, very pleasant gentleman.  NECK:  Jugular vein distention around 7 to 8 cm at a 45-degree angle.  LUNGS:  Clear.  CARDIOVASCULAR:  Exam reveals S1 and S2, regular rate and rhythm with a  3/6 systolic ejection murmur noted.  ABDOMEN:   Soft, nontender, positive bowel sounds obese.  EXTREMITIES:  Lower extremities with 1+ pitting edema bilaterally.  NEUROLOGICAL:  Alert and oriented x3.  Ambulating in clinic without  assistance.   IMPRESSION:  Congestive heart failure, secondary diastolic dysfunction  with a normal EF.  The patient with signs of mild volume overload at  this time, will continue current medications.  The patient states he has  an appointment with Dr. Florene Glen coming up.  I am going to wait on lab  work, and I am sure Dr. Florene Glen will want labs done and I will request  that a BMP be checked, when his other labs are checked at Dr. Abel Presto  office.  I have initiated heart failure education with Mr. Ream.  I  have instructed him to weigh daily.  He has been notifying me if his  weight increases 3 pounds with in 2 days.  Further management of his  heart failure is rather tentative at this time,  will depend on Dr. Abel Presto plans in regards to further renal  management.  Mr. Yahnke blood pressure is well controlled at this  time.      Rosanne Sack, ACNP  Electronically Signed      Minus Breeding, MD, [facility]  Electronically Signed   MB/MedQ  DD: 02/08/2008  DT: 02/09/2008  Job #: [id number]   cc:   Darrold Span. Florene Glen, M.D.  Elayne Snare, M.D.

## 2011-05-12 NOTE — Op Note (Signed)
[name], [name]               ACCOUNT NO.:  [id number]   MEDICAL RECORD NO.:  [id number]          PATIENT TYPE:  INP   LOCATION:  5121                         FACILITY:  [facility]   PHYSICIAN:  Mare Loan, MD      DATE OF BIRTH:  1941/04/25   DATE OF PROCEDURE:  09/09/2008  DATE OF DISCHARGE:                               OPERATIVE REPORT   PREOPERATIVE DIAGNOSIS:  Acute cholecystitis.   POSTOPERATIVE DIAGNOSIS:  Acute cholecystitis.   PROCEDURE:  Laparoscopic cholecystectomy.   SURGEON:  Mare Loan, MD   ASSISTANT:  Adin Hector, MD   ANESTHESIA:  General endotracheal anesthesia.   FINDINGS:  Inflamed gallbladder with wall thickening consistent with  cholecystitis.   SPECIMEN:  Gallbladder to pathology.   ESTIMATED BLOOD LOSS:  100 mL.   COMPLICATIONS:  None.   DRAINS:  No drains were placed.   INDICATIONS FOR PROCEDURE:  Mr. Baumert is a 70 year old male, who came  in Friday with abdominal pain.  Had an ultrasound with wall thickening  and stones within the gallbladder.  HIDA scan was performed and showed  no filling of the gallbladder.  Examination was also consistent with  acute cholecystitis.  Informed consent was obtained for laparoscopic  cholecystectomy.  I explained the risk of the procedure including but  not limited to bleeding, infection, hematoma, biloma, injury to common  duct of Mr. Stencil.  He agreed to proceed with the procedure.   DETAIL OF THE PROCEDURE:  Mr. Repetti was identified in the preoperative  holding area.  He was on Zosyn preoperatively.  He was then taken to the  operating room and placed in the supine position.  After administration  of general endotracheal anesthesia, his abdomen was prepped and draped  in the usual sterile fashion.  A time-out procedure was performed.  I  anesthetized the skin at the supraumbilical area.  I placed an incision  with a #11 blade.  I grasped the umbilicus with a Kocher.  I placed a  Veress  needle into the abdominal cavity.  After adequate insufflation, I  placed a #11 mm trocar using a 10 zero degree scope into the abdominal  cavity.  There was no evidence of injury upon placement of trocar.  There was no adhesions to the Veress needle.  A 12-mm trocar was placed  in the epigastric region and two were placed in the right side of the  abdomen.  There was omental adhesions to the gallbladder.  These were  swept away with blunt dissection.  The gallbladder was grasped at the  fundus.  It did have a very thick wall and was somewhat difficult to  grasp.  We retracted this toward the head of the patient.  I grabbed the  infundibulum and grasped this away from the liver bed.  Using the  [place] forceps and electrocautery, I dissected at the infundibulum.  There was somewhat large cystic node which was dissected from the cystic  artery.  I was able to isolate the cystic artery and was anterior and  medial to the cystic duct.  This was clipped and divided.  I then  continued dissecting around the cystic duct.  There was a thick  gallbladder wall peritoneum.  I was able to isolate the cystic duct and  obtained a good view of the cystic duct.  This was somewhat enlarged.  I  felt no stones.  I placed three clips proximally, one distally, and  transected the cystic duct.  I then chose to place an Endoloop around  the cystic duct due to the thickened wall.  A #0 PDS Endoloop was  sutured around the cystic duct.  We then continued dissecting peritoneum  of the gallbladder from the liver using electrocautery.  There was a  small amount of oozing from the liver bed with dissection of the  gallbladder.  This was controlled with electrocautery.  I did also place  Surgicel to the liver bed.  The gallbladder was removed and EndoCatch  bag from the umbilicus.  Upon final inspection of the abdomen, there  appeared to be no further bleeding.  A 2 liters of saline were used to  irrigate the  abdomen.  The fascial defect of the umbilicus was closed  with 0 Vicryl suture.  Skin was closed with 4-0 Monocryl and Steri-  Strips placed for final dressing.  The patient was extubated and  transported to anesthesia care unit in stable condition.  He will be  kept overnight and monitored for pain control and likely if he does  well, he will be able to be discharged home tomorrow.  He will be  monitored closely for his diabetes and we will have him follow up with  primary care physician next week.      Mare Loan, MD  Electronically Signed     ALA/MEDQ  D:  09/09/2008  T:  09/10/2008  Job:  [id number]

## 2011-05-12 NOTE — Discharge Summary (Signed)
NAMETREYE, LUDINGTON               ACCOUNT NO.:  [id number]   MEDICAL RECORD NO.:  [id number]          PATIENT TYPE:  INP   LOCATION:  [id number]                         FACILITY:  [facility]   PHYSICIAN:  Annita Brod, M.D.DATE OF BIRTH:  [birth year]   DATE OF ADMISSION:  10/17/2008  DATE OF DISCHARGE:  11/01/2008                               DISCHARGE SUMMARY   PRIMARY CARE [place]all[place]:  Dr. Elayne Snare.   CONSULTANTS:  Dr. Lorrene Reid, nephrology.  Dr. Olevia Perches, [facility].  Dr. Merton Border, pulmonary critical care medicine.   HOSPITAL COURSE:  The patient is a 70 year old African American male  with past medical history of chronic renal insufficiency, diabetes  mellitus, and diastolic dysfunction as well as sleep apnea who presented  from the [facility] skilled nursing facility with shortness of  breath and wheezing.  On admission he was found to have a interstitial  infiltrates and hemoglobin down to 7.4 suspected to be secondary to his  renal disease.  The patient was admitted.  Dr. Haroldine Laws from [facility]iology saw the patient on behalf of Dr. Olevia Perches after he was noted to  have a 20 pound weight gain and it was suspected he was in acute CHF.  His BNP was noted to be 926.  Time of admission his BUN was 61 and  creatinine was 4.3.  Following his admission, nephrology was consulted  as well, given the patient's worsening renal failure.  They recommended  aggressive diuresis and also following his anemia to ensure it was not  from renal disease and I suspect that the patient would eventually need  dialysis.  Over the next several days, the patient had a mild  improvement and continued to be diuresing well, and then on October 24,  started having some respiratory distress, requiring increased  oxygenation.  He also spiked a fever of 101.6.  BiPAP was placed and the  patient was transferred to the step-down unit.  Over the next day, the  patient started to decline.  His daily  weights showed improvement in his  diuresis.  However, by October 25, the patient's respiratory failure  worsened.  He required intubation.  At this point, critical care  medicine took over the patient's care.  It was felt that his respiratory  failure was secondary to decompensated diastolic heart failure in the  setting of end-stage renal disease.  He is not diuresing as adequately  because of his renal dysfunction.  He was also found to have a urinary  tract infection as his white count had increased on October 25 up to  22.9.  Proteus mirabilis had grown out.  The patient was started on  vancomycin and Zosyn.  The patient was able to be extubated on October  26.  His white count had improved at 14.3 as of that day.  Over the next  day the patient on October 28 was noted to be having episodes of  paroxysmal atrial fibrillation.  A 2-D echo was ordered which showed  again signs of acute on chronic diastolic dysfunction with preserved  ejection fraction.  The patient  continued to improve and, given the  events, nephrology planned for dialysis and the patient underwent  dialysis as of October 30.  He was started on Coumadin for his atrial  fibrillation.  He had brief leukocytosis up to 16 which improved slowly.  October 31 the patient was doing better.  He was remaining in normal  sinus rhythm, and was put on amiodarone and Coumadin.  Medications were  adjusted by cardiology and nephrology.  The patient had PT/OT who  recommended followup with skilled nursing upon time of discharge.  By  November 2 the patient was doing well.  His white count came down to  10.5.  His INR was therapeutic.  Because of the decompensated CHF, the  patient underwent by cardiology a Myoview scan.  There was concern the  patient might need a catheterization and his Coumadin was put on hold.  However, his Myoview was negative.  The patient continued to receive  dialysis 3 days a week and by November 5, the patient  was cleared by  cardiology standpoint, who said they wanted to see the patient in 3  weeks so that they could follow his Coumadin levels from dialysis.  Plan  is for the patient to receive outpatient hemodialysis, Tuesdays,  Thursdays and Saturdays in the outpatient [facility] [place].  The patient is showing continued signs of improvement.  He has completed  a full course of Rocephin and was felt medically stable for dialysis  discharge on November 5 as well.   DISCHARGE MEDICATIONS:  1. Actos 15 mg p.o. daily.  2. Amaryl 2 mg p.o. daily.  3. Vitamin D 1 tablet p.o. daily.  4. Aspirin 81 mg p.o. daily.  5. Calcium 800 mg p.o. daily.  6. Uroxatral 10 mg p.o. daily.  7. Nephro-Vite 1 tablet p.o. daily.  8. PhosLo 667 1 tablet p.o. daily.  9. Percocet 1 to 2 tablets p.o. q.4 hours p.r.n.  10.Tylenol 650 p.o. q.6 hours p.r.n.   ADDITIONAL MEDICATIONS:  1. Coumadin 6 mg p.o. nightly.  2. Amiodarone 2 mg p.o. b.i.d.  3. Imdur 30 mg p.o. daily.  4. Lopressor 50 mg 1/2 tablet, that should be 25 mg p.o. b.i.d.  5. The patient was also to be discharged on Epogen, which he will get      once a month at the Dialysis Center.  6. He is also on Zemplar 1 mcg IV which he will receive at dialysis as      well.   DISCHARGE DIAGNOSES:  1. Acute on chronic diastolic heart failure.  2. 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  3. Respiratory failure requiring ventilator support, now resolved.  4. Diabetes mellitus.  5. Paroxysmal atrial fibrillation, now on anticoagulation.  6. Sleep apnea.  7. Hypertension.  8. History of status post cholecystectomy.   The patient's overall disposition is improved.  His activity will be as  per skilled nursing.  His discharge diet will be a renal diet, carb  modified.  He is being discharged to skilled nursing facility today,  November 02, 2008.   FOLLOWUP APPOINTMENTS:  He will follow up with nephrology at the  Dialysis Center.  He will follow  up with Dr. Olevia Perches of [facility] 3 weeks from now.  He will follow up with Dr. Dwyane Dee, his PCP,  in the next 2 to 4 weeks.      Annita Brod, M.D.  Electronically Signed     SKK/MEDQ  D:  11/01/2008  T:  11/01/2008  Job:  [id number]   cc:   Vanna Scotland. Olevia Perches, MD, [place] Bensimhon, MD  Zella Richer. Alva Garnet, [place] Lorrene Reid, M.D.

## 2011-05-12 NOTE — Assessment & Plan Note (Signed)
[facility]                            CARDIOLOGY OFFICE NOTE   BURHAN, SMYRE                      MRN:          [id number]  DATE:01/14/2009                            DOB:          1941-10-14    PRIMARY CARE PHYSICIAN:  Elayne Snare, MD   NEPHROLOGIST:  Elzie Rings. Lorrene Reid, MD   CLINICAL HISTORY:  Mr. Malott is a 70 year old returned for management  of diastolic heart failure and atrial fibrillation.  He was hospitalized  last year with end-stage renal disease and volume overload/diastolic  heart failure.  Dialysis was initiated and this has controlled his  volume.  About 2 weeks ago, he became little dizzy after dialysis and  had to go to the emergency room and the thought was that he had had too  much volume taken off.  He has done better since that time, although his  blood pressures still had been low and dialysis at times and he has been  advised not to take his metoprolol prior to dialysis.  He has had no  chest pain or palpitations.   His past medical history is significant for hypertension, diabetes,  sleep apnea.  He has also had a previous cholecystectomy, which preceded  his initiation of dialysis.   His current medications include:  1. Dialysis.  2. Aspirin.  3. Nephro-Vite.  4. Uroxatral.  5. Metoprolol 25 mg b.i.d. hold on days of dialysis.  6. Calcium.  7. Isosorbide 30 mg daily.  8. Lipitor 40 mg daily.  9. Amiodarone 200 mg b.i.d.  10.Prandin 1 mg b.i.d.   PHYSICAL EXAMINATION:  VITAL SIGNS:  Blood pressure is 145/69, pulse 63  and regular.  NECK:  There is no venous distension.  The carotid pulses were full  without bruits.  CHEST:  Clear.  CARDIAC:  Rhythm is regular.  No murmurs or gallops.  ABDOMEN:  Soft without organomegaly.  EXTREMITIES:  There is trace peripheral edema.  There is a shunt in the  left arm.   IMPRESSION:  1. Diastolic heart failure now controlled on dialysis.  2. End-stage renal disease  on dialysis.  3. Recent episode of hypotension related to volume depletion following      dialysis.  4. Hypertension treated.  5. History of paroxysmal atrial fibrillation.  6. Diabetes.  7. Obstructive sleep apnea.   RECOMMENDATIONS:  I think Mr. Michaelis is doing better.  He and his  daughter who is with him today are very anxious for him to get off some  of his medicines.  Since he does not have documented coronary disease  and since he is on dialysis, I think he can come off the Lipitor.  He  has not had any angina, so he can come off the isosorbide.  We can cut  the amiodarone back from 200 b.i.d. to 200 once a day.  The nephrologist  stopped his Coumadin, I think because of a bleeding episode or  difficulty initiating dialysis.  We will plan to continue his other  medicines the same.  We will see him back in 5 months.  Bruce Alfonso Patten Olevia Perches, MD, [facility]  Electronically Signed    BRB/MedQ  DD: 01/14/2009  DT: 01/15/2009  Job #: [id number]   cc:   Elayne Snare, M.D.  Elzie Rings Lorrene Reid, M.D.

## 2011-05-12 NOTE — H&P (Signed)
[name], [name]               ACCOUNT NO.:  [id number]   MEDICAL RECORD NO.:  [id number]          PATIENT TYPE:  OBV   LOCATION:  [id number]                         FACILITY:  [facility]   PHYSICIAN:  Annita Brod, M.D.DATE OF BIRTH:  1941-08-03   DATE OF ADMISSION:  01/02/2009  DATE OF DISCHARGE:                              HISTORY & PHYSICAL   PRIMARY CARE PHYSICIAN:  Elayne Snare, M.D.   CONSULTANTS ON CASE:  1. Dr. Mercy Moore.  2. Dr. Florene Glen, Nephrology.  3. Dr. Barron Schmid GI.  4. Dr. Olevia Perches, patient's cardiologist.   CHIEF COMPLAINT:  Weakness.   HISTORY OF PRESENT ILLNESS:  The patient is a 70 year old African  American male with past medical history of end-stage renal disease,  hypertension and diabetes who lives at home by himself.  He had dialysis  yesterday and appeared to be doing relatively well, but, when his  daughter went to go see him today, he was very weak, he could barely  stand.  His knee legs buckled out from under him several times so she  brought into the emergency room.  In the emergency room, the patient was  found to be quite orthostatic; in fact, he passed out when they stood  him up.  Labs were ordered on the patient and he was a difficult lab  stick because of his vascular issues.  However, on return labs, he was  found to have a hemoglobin of 7.1.  In review of his previous lab work,  he was found to have a CBC done in December, approximately a month ago,  and at that time his hemoglobin was 14.2.  The patient does not note any  black tarry stools or bright red blood per rectum, but, in review of  previous CBCs, he has not really had a hemoglobin below 10.  With these  findings, he was Hemoccult tested and he was found to be Hemoccult  positive and there was a concern for the possibility of a GI bleed.  Currently, the patient is doing okay after receiving some IV fluids.  His pressure, which initially when he came in was in the low 90s and  that has  improved to the low 100s.   REVIEW OF SYSTEMS:  He denies any headaches, vision changes or  dysphagia.  No chest pain or palpitations, no shortness of breath,  wheeze or cough.  No abdominal pain.  No hematuria.  He barely makes any  urine.  No constipation, no diarrhea.  No focal extremity numbness,  weakness or pain, but, overall, he feels very fatigued, hard to stand.  Review of systems otherwise negative.   PAST MEDICAL HISTORY:  1. End-stage renal disease on hemodialysis.  His last session was      yesterday.  His next session is scheduled for tomorrow.  2. He has a history of diastolic heart failure.  3. Diabetes mellitus.  4. Hypertension.  5. Sleep apnea.  6. Paroxysmal A fib.  Normally in sinus rhythm.   CURRENT MEDICATIONS:  1. He is on amiodarone 200 mg p.o. daily.  2. Aspirin 81 mg  p.o. daily.  3. Calcium acetate p.o. daily.  4. Imdur was 30 p.o. q.h.s.  5. Lipitor 10, dose unclear.  6. Metoprolol 25 mg p.o. t.i.d.  7. Coumadin.   He has NO KNOWN DRUG ALLERGIES.   SOCIAL HISTORY:  Denies any tobacco, alcohol or drug use.  Lives alone.   FAMILY HISTORY:  Noncontributory.   PHYSICAL EXAMINATION:  VITALS:  Heart rate is 94, blood pressure of  92/57, since then it has improved to 121/52, respirations 16, pulse 68,  temperature 98.2, 02 100% on room air.  GENERAL:  He is alert and oriented x3 in no apparent distress.  HEENT: Normocephalic atraumatic.  His mucous membranes are slightly dry.  He has no carotid bruits.  HEART:  Currently in a regular rate and rhythm, S1-S2 with a soft 2/6  systolic ejection murmur.  LUNGS:  Clear to auscultation bilaterally.  ABDOMEN:  Soft, nontender, nondistended.  Positive bowel sounds.  EXTREMITIES:  Show no clubbing, cyanosis or edema.   LAB WORK:  White count 11.4, H and H 7.1 and 21, MCV of 94, platelet  count 202.  INR is 1.3.  CPK greater than 500, but MB less than 1,  troponin is less than 0.05.  Sodium 142, potassium 3.6,  chloride 103,  BUN 41, creatinine 5.3, glucose 125.  His differential shows an 80%  shift.  He has been typed and crossed for 2 unit blood cells  transfusion.  A chest x-ray shows no acute disease.   ASSESSMENT/PLAN:  1. Syncope which is secondary to number two.  2. Orthostatic hypotension which is possibly secondary to number      three.  3. Anemia, which I am concerned about the possibility of him having GI      blood loss, especially Hemoccult positive.  I have spoken with      [facility] who will see the patient.  In the meantime, he is      receiving 2 units of packed red blood cells.  Check a hemoglobin 1      hour post transfusion.  4. End-stage renal disease.  Nephrology has been consulted and is      following the patient.  He may go for dialysis tomorrow.  5. Diabetes mellitus.  N.p.o. sliding scale only q.4 hours.      Annita Brod, M.D.  Electronically Signed     SKK/MEDQ  D:  01/02/2009  T:  01/02/2009  Job:  [id number]   cc:   Elayne Snare, M.D.  Darrold Span. Florene Glen, M.D.  Lowella Bandy. Olevia Perches, MD  Jeryl Columbia, M.D.

## 2011-05-12 NOTE — Op Note (Signed)
[name], [name]               ACCOUNT NO.:  [id number]   MEDICAL RECORD NO.:  [id number]          PATIENT TYPE:  AMB   LOCATION:  SDS                          FACILITY:  [facility]   PHYSICIAN:  [name] IV, MDDATE OF BIRTH:  November 06, 1941   DATE OF PROCEDURE:  03/28/2008  DATE OF DISCHARGE:  03/28/2008                               OPERATIVE REPORT   PREOPERATIVE DIAGNOSIS:  Chronic kidney disease.   POSTOPERATIVE DIAGNOSIS:  Chronic kidney disease.   PROCEDURE PERFORMED:  Left forearm loop graft with 6 mm Gore-Tex graft.   TYPE OF ANESTHESIA:  General.   BLOOD LOSS:  Minimal.   FINDINGS:  Arterial anastomosis is on the radial side.   PROCEDURE:  The patient was identified in the holding area and taken to  room 8.  He was placed supine on the table.  General endotracheal  anesthesia was administered.  A time-out was called and perioperative  antibiotics were administered.  After the left forearm had been prepped  and draped in usual sterile fashion, a transverse incision was made at  the level of the antecubital crease.  Bovie cautery was used to dissect  through the subcutaneous tissue.  The bicipital aponeurosis was divided  with Metzenbaum scissors.  The brachial vein was readily identified.  This was mobilized proximally and distally and encircled with a vessel  loop.  Next, the brachial artery was circumferentially dissected and  vessel loops were placed proximally and distally.  Next, a counter  incision in the distal forearm was made and subcutaneous tunnel was  created using a straight tunneler.  A 6-mm stretch Gore-Tex graft was  brought through the tunnel.  At this point in time, the patient was  systemically heparinized.  The arterial anastomosis was constructed  first.  Serrefine clamps were placed proximally and distally on the  brachial artery and a #11 blade was used to make an arteriotomy.  This  was extended with Potts scissors.  The graft was beveled to  match the  size of the arteriotomy.  A running anastomosis was created with a 6-0  Prolene.  Prior to completion, the anastomosis of the graft and brachial  artery was flushed in antegrade and retrograde fashion.  The anastomosis  was then secured.  The clamps were released.  There was good pulsatile  flow through the graft.  Next, the graft was flushed with heparinized  saline and reoccluded.  Attention was then turned towards the venous  anastomosis.  The brachial vein was approximately 3 mm in diameter.  A  longitudinal venotomy was made and extended with Potts scissors.  The  graft was then beveled to match the size of the venotomy.  A running end-  to-side anastomosis was created using 6-0 Prolene.  Prior to completion,  the graft was flushed.  The anastomosis was then secured.  All clamps  were then released.  There is a good palpable thrill within the graft.  Doppler was used to evaluate signal within the radial  artery which had good flow.  Next, 50 mg of protamine were given.  The  wound was then copiously irrigated.  The deep tissue was reapproximated  with with a 3-0 Vicryl and the skin was closed with 4-0 Vicryl.  Dermabond was placed.  The patient was successfully extubated and taken  to recovery room in stable condition.           ______________________________  V. Leia Alf, MD  Electronically Signed     VWB/MEDQ  D:  03/28/2008  T:  03/28/2008  Job:  [id number]

## 2011-05-12 NOTE — Assessment & Plan Note (Signed)
OFFICE VISIT   FLORES, BARRIERE T  DOB:  10-Dec-1941                                       03/19/2008  [id number]   REASON FOR VISIT:  For dialysis access.   HISTORY:  This is a 70 year old gentleman who is seen at the request of  Dr. Florene Glen for long-term dialysis access.  The patient has chronic  kidney disease, not yet on dialysis.  He was recently admitted to the  hospital for pneumonia and congestive heart failure.  He has recovered  from that and is doing well at this time.  He also has a history of high  blood pressure as well as diabetes beginning at the age of 10, which  does not require insulin.  The patient is a right-handed gentleman.   REVIEW OF SYSTEMS:  GENERAL:  Negative.  CARDIAC:  Positive for shortness of breath with lying flat.  PULMONARY:  Negative.  GASTROINTESTINAL:  Black stool.  GENITOURINARY:  Frequent urination.  VASCULAR:  Pain in the legs with walking.  NEURO:  Negative.  ORTHO:  Negative.  PSYCH:  Negative.  ENT:  Negative.  HEMATOLOGICAL:  Negative.   FAMILY HISTORY:  No cardiovascular disease history.   SOCIAL HISTORY:  Single with 3 children.  Retired.  Does not smoke.  Has  a history of smoking.  Quit in 1980.  Does not drink.   MEDICATIONS:  Include vitamin D, calcium 500 mg per day, iron every day,  guaifenesin 2 mg once a day, labetalol 200 mg twice a day, Hectorol 0.5  mg per day, Uroxatral 10 mg per day, Lasix 160 mg twice a day,  amlodipine 10 mg a day, potassium 20 mg per day, allopurinol 100 mg per  day, glimepiride 4 mg per day, and multivitamin.   ALLERGIES:  None.   PHYSICAL EXAMINATION:  Vital signs:  Heart rate 68, blood pressure  135/72.  In general, he is well-appearing, in no acute distress.  Cardiovascular is regular rate and rhythm.  HEENT is normocephalic,  atraumatic.  Pulmonary is clear bilaterally.  Abdomen is soft.  Extremities with palpable left radial pulse.  Neuro, cranial nerves  2-12  grossly intact.  Psych, he is alert and oriented x3.   Vein mapping was performed today.  He has an occluded cephalic vein  beginning at the level of the antecubital crease.   ASSESSMENT AND PLAN:  This is a 70 year old right-handed gentleman here  for long-term dialysis access.   PLAN:  Based on the patient's vein mapping, he is not a candidate for a  left arm fistula.  Therefore, we will proceed with a left forearm graft,  given that he is right-hand-dominant.  The risks and benefits of the  procedure were discussed with the patient, including infection which  could ultimately require graft removal, risk of steal, risk of bleeding.  He understands these and is scheduled to undergo a left forearm GORE-TEX  graft on Wednesday, April 1.   Eldridge Abrahams, MD  Electronically Signed   VWB/MEDQ  D:  03/19/2008  T:  03/20/2008  Job:  [id number]

## 2011-05-12 NOTE — Assessment & Plan Note (Signed)
[facility]                          CHRONIC HEART FAILURE NOTE   [name], [name]                      MRN:          [id number]  DATE:06/05/2008                            DOB:          02-28-1941    PRIMARY CARE PHYSICIAN:  Edward Snare, MD   PRIMARY CARDIOLOGIST:  [name]. Edward Perches, MD, [place]   NEPHROLOGY:  [name]. Edward Glen, MD   HISTORY OF PRESENT ILLNESS:  [name] returns today for followup of  his congestive heart failure, which is secondary to diastolic  dysfunction.  I have not seen [name] here in the [facility] since March.  Since that time, he has followed up with Dr. Olevia [name]  and Dr. Florene [name].  [name] is status post placement of AV fistula and  is being followed by Dr. Florene [name] for his renal insufficiency.  Mr.  [name] continues to remain active.  He states he brought a stationary  bike and has been trying to ride for about 12 minutes for the last 2  weeks.  He also continues to play golf.  He confesses to dietary  indiscretion over the last few weeks.  He states he has eaten barbecued  ribs.  This morning for breakfast, he had sausage, eggs, and biscuits.  He denies any orthopnea or PND.  He does complain of leg cramps at  times.  He states compliance with his medication.   PAST MEDICAL HISTORY:  1. 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  2. Chronic kidney disease, followed by Dr. Florene [name], status post      placement of AV fistula.  3. Type 2 diabetes.  4. Obstructive sleep apnea with CPAP compliance.  5. Diabetic nephropathy.  6. Hyperlipidemia.  7. Dietary indiscretion.  8. Anemia of chronic disease.  9. Secondary hyperparathyroid.  10.History of elevated PSA.   REVIEW OF SYSTEMS:  As stated above, otherwise negative.   CURRENT MEDICATIONS:  1. Aspirin 81 daily.  2. Vytorin 10/20 daily.  3. Norvasc 10 mg daily.  4. Ferrous sulfate daily.  5. Uroxatral daily.  6. Multivitamin daily.  7. K-Dur 20 mEq 2 tablets b.i.d.  8. Vitamin D.  9. Calcium daily.  10.Allopurinol 100 mg daily.  11.Actos 15 mg daily.  12.Glimepiride 2 mg daily.  13.Labetalol 300 mg 2 tablets b.i.d.  14.Metolazone 2.5 mg daily.  15.Valium 5 mg daily.  16.Lasix 80 mg 2 tablets in the morning and 1 in the evening.  17.Tekturna 100 mg daily.   CLINICAL DATA:  Recent lab work checked on May 10, 2008.  H&H 12.7 and  38.7.  Potassium 4.8, BUN 110, and creatinine 5.8.   PHYSICAL EXAMINATION:  Weight 205 pounds, weight is up 13 pounds, blood  pressure 166/73 with a heart rate of 93.  [name] is in no acute distress.  JVD around 12 cm  at a 45-degree angle.  LUNGS:  Clear to auscultation bilaterally.  CARDIOVASCULAR:  S1 and S2.  Regular rate and rhythm.  ABDOMEN:  Soft and nontender.  Positive bowel sounds.  EXTREMITIES:  Lower extremities with +2 pitting edema.  NEUROLOGICAL:  Alert and oriented.   IMPRESSION:  Congestive heart failure secondary to diastolic dysfunction  with a normal ejection fraction.  The patient with significant volume  overload at this time with a 13-pound weight gain in the setting of  elevated blood pressure.  I am going to increase his Lasix to 160 mg  b.i.d.  The patient states his [name] was decreased from 300 mg to 100  mg by his primary care physician.  I am not sure as to the reason why.  [name] states he has an appointment with Dr. Dwyane [name] next week.  We  will let Dr. Dwyane [name] will address his hypertension with changes in his  blood pressure medication.  We will plan on checking blood work on Mr.  [name] today.  I will fax a copy to [name] and Dr. Florene [name].      Rosanne Sack, ACNP  Electronically Signed      Shaune Pascal. Bensimhon, MD  Electronically Signed   MB/MedQ  DD: 06/05/2008  DT: 06/06/2008  Job #: [id number]   cc:   [name], M.D.  [name], M.D.

## 2011-05-12 NOTE — Consult Note (Signed)
[name], [name]               ACCOUNT NO.:  [id number]   MEDICAL RECORD NO.:  [id number]          PATIENT TYPE:  INP   LOCATION:  5121                         FACILITY:  [facility]   PHYSICIAN:  Loralie Champagne, MD      DATE OF BIRTH:  1941/09/02   DATE OF CONSULTATION:  DATE OF DISCHARGE:                                 CONSULTATION   PRIMARY CARDIOLOGIST:  Minus Breeding, MD, [facility]. Olevia Perches, MD,  [facility]   PRIMARY CARE DOCTOR:  Dr. Dwyane Dee.   CHIEF COMPLAINT:  Right upper quadrant pain.   HISTORY OF PRESENT ILLNESS:  This is a 70 year old African American male  with a history of chronic kidney disease, hypertension, diastolic  congestive heart failure who presented to the emergency department with  probable acute cholecystitis.  He has had increasing right upper  quadrant pain and nausea for the last 3 days, today it has been quite  severe.  He came to the emergency department and had ultrasound of his  abdomen done which showed gallstones and sludge with mild gallbladder  wall thickening.  This was confirmed by CT and Cardiology was asked to  come and evaluate his cardiac risk preoperatively for possible  cholecystectomy.  Recently, the patient has been quite stable.  He plays  golf, however, he rides in a cart and he rides an exercise bike for 12  minutes a day.  He has mild dyspnea on exertion when riding his exercise  bike.  He is able to climb a flight of steps but is somewhat short of  breath by the time he gets to the top.  He can climb a flight of steps  without stopping.  He can walk without difficulty on flat ground.  He  did have one episode about 6 months ago where he was tried to push mow  his lawn and going up hill, he got very short of breath, quite severely  short of breath, and had to stop.  He has had nothing that severe since  that time.  He denies any chest pain, chest tightness, or chest  pressure.  He says he has never had any in the past.  He has no  paroxysmal nocturnal dyspnea.  He does state that he has been sleeping  on 2-pillows chronically, he attributes this due to sleep apnea.   PAST MEDICAL HISTORY:  1. Chronic diastolic heart failure.  Most recent echocardiogram was in      January 2009.  He had an EF of 55%.  Trivial aortic insufficiency      mildly dilated left atrium and mild left ventricular hypertrophy.      He had an adenosine Cardiolite done in July 2005.  EF was 64%.      There was no evidence for ischemia or infarction.  2. Stage III chronic kidney disease followed by Dr. Florene Glen.  The      patient is status post left femoral artery AV fistula in April      2009.  His baseline creatinine is 3.4.  3. Type 2 diabetes.  4. Hypertension.  5. Sleep apnea.  The patient uses CPAP at home.  6. Diabetic neuropathy.  7. Obesity.   SOCIAL HISTORY:  This patient lives in [place].  He is retired from  time [employer].  He smoked.  He has 3-4 pack year history of tobacco,  however he did quit smoking several years ago.  He has not drunk any  alcohol for about 15 years.  Does not use any illicit drugs.   FAMILY HISTORY:  The patient's mother died of Alzheimer's in 40 age.  Father died at 58 years of age, he is not sure why he passed away.  Of  his siblings, there is a history of leukemia and history of diabetes,  however no early coronary artery disease.   REVIEW OF SYSTEMS:  Negative except as noted in history of present  illness.   ALLERGIES:  No known drug allergies.   MEDICATIONS:  1. Amaryl 2 mg daily.  2. Guaifenesin p.r.n.  3. Multivitamin.  4. Tekturna 100 mg daily.  5. Aspirin 81 mg daily.  6. Diazepam 5 mg daily.  7. Amlodipine 10 mg daily.  8. Calcium.  9. Vytorin  10/20 mg daily.  10.Metolazone 2.5 mg daily.  11.Allopurinol 100 mg daily.  12.Labetalol 600 mg daily.  13.Lasix 160 mg b.i.d.  14.Potassium chloride 40 mEq which he says he is taking 4 times a day.   RADIOLOGY:  Abdominal ultrasound  today shows gallstones and sludge with  mild gallbladder wall thickening.  CT of the abdomen and pelvis shows  cholelithiasis with a question of acute cholecystitis.   Labs today show white count of 14, hematocrit 29.8, platelets 218,  potassium 4.0, BUN 12, creatinine 1.23, alkaline phosphatase 128, AST  27, ALT 90, ALT 19, and lipase is 31.   PHYSICAL EXAMINATION:  VITAL SIGNS:  Temperature 98, pulse 72 and  regular, respiratory rate 18, blood pressure 171/72, and O2 sat 96% on  room air.  GENERAL:  This is an obese male in no apparent distress.  HEENT:  Normal.  ABDOMEN:  Soft.  There is moderate right upper quadrant tenderness.  There is no rebound.  There is a positive Murphy sign.  There is no  hepatosplenomegaly.  NECK:  Supple without lymphadenopathy.  There is no JVD.  There is no  lymphadenopathy.  CARDIOVASCULAR:  Heart regular S1 and S2.  There is an S4 noted.  There  is a 2/6 systolic early peaking crescendo-decrescendo right upper  sternal border murmur.  There is trace ankle edema bilaterally.  There  is no jugular venous distention.  EXTREMITIES:  No clubbing or cyanosis.  SKIN:  No rash.  NEUROLOGICAL:  The patient is alert and oriented x3.  Normal affect.  MUSCULOSKELETAL:  Normal.   ASSESSMENT AND PLAN:  This 70 year old with diastolic heart failure,  hypertension and chronic kidney disease who presents with probable acute  cholecystitis.  1. Preoperative risk evaluation.  The patient does have diastolic      congestive heart failure with a New York Heart Association class II      symptoms currently.  He has no history of coronary artery disease      or chest pain or chest pressure.  He can undergo 4+ METS of      exercise regularly.  He is euvolemic on exam.  If it is needed I      think the patient would be stable for cholecystectomy without      further cardiac testing.  Recommend  continue his beta blocker.      Favor a laparoscopic approach.  Of note, his  perioperative and      postoperative volume status control could be difficult given his      chronic kidney disease in combination with diastolic congestive      heart failure.  2. Congestive heart failure.  The patient has stable New York Heart      Association class II symptoms.  He is euvolemic at this time but he      is on high-dose diuretics.  I would be very careful with volume      status perioperatively avoiding overhydration.  3. Chronic kidney disease.  The patient's creatinine is 1.23 today.  I      find this very unusual given his baseline in the past has been      around 3.4 and I see no reason why it should have suddenly      improved.  I think that a repeat chemistry should be done.  4. Right upper quadrant pain.  This is probably cholecystitis based on      his ultrasound and CT.  His normal LFTs and good volume status are      against hepatic congestion from volume overload.      Loralie Champagne, MD  Electronically Signed     DM/MEDQ  D:  09/07/2008  T:  09/08/2008  Job:  [id number]

## 2011-05-12 NOTE — Assessment & Plan Note (Signed)
[facility]                            CARDIOLOGY OFFICE NOTE   KEILEN, MAYNER                      MRN:          [id number]  DATE:05/10/2008                            DOB:          Feb 24, 1941    NEPHROLOGIST:  Darrold Span. Florene Glen, M.D.   PRIMARY CARE PHYSICIAN:  Elayne Snare, M.D.   CLINICAL HISTORY:  Mr. Thomsen is a 70 year old who returns for  management of his diastolic heart failure.  He had previously been a  patient of Dr. Arlina Robes, and has been recently seen a by Rosanne Sack  in our CHF clinic and is also followed by Dr. Florene Glen.  He was  hospitalized recently with a diastolic heart failure and renal  insufficiency and he had an AV fistula placed at the recommendation of a  nephrologist at that time.  His creatinine was in the range of 3.8.  Rosanne Sack last saw him about 3 weeks ago and added Zaroxolyn 2.5  mg daily to his current medications, and he had a good diuresis with  that.  He is doing much better with his volume status, but he has  developed cramps and he feels quite weak.  He had no chest pain or  shortness of breath.   PAST MEDICAL HISTORY:  1. Significant for type 2 diabetes.  2. Obstructive sleep apnea.  3. Hyperlipidemia.  4. Elevated PSA.   CURRENT MEDICATIONS:  1. Tekturna 300 mg daily.  2. Gingko.  3. Calcium.  4. Allopurinol.  5. Actos.  6. Aspirin.  7. Vytorin 10/20 daily.  8. Labetalol 40 mg b.i.d.  9. Norvasc 10 mg daily.  10.Hectorol 0.5 mg daily.  11.Ferrous sulfate 325 mg daily.  12.Uroxatral.  13.K-Dur 20 mEq 2 tablets b.i.d.  14.Zaroxolyn 2.5 mg daily.   PHYSICAL EXAMINATION:  VITAL SIGNS:  Blood pressure 159/65 and pulse 67  and regular.  There was no venous tension.  The carotid pulses were full  without bruits.  CHEST:  Clear.  CARDIAC:  Rhythm was regular.  No murmurs or gallops.  ABDOMEN:  Soft without organomegaly.  There was no peripheral edema,  although there were some stasis  changes in lower extremities.  Pedal  pulses were equal.   STUDIES:  Electrocardiogram showed sinus rhythm and Q waves in V1-V3.   IMPRESSION:  1. Diastolic heart failure now euvolemic or possibly hypovolemic.  2. Diffuse muscle cramps.  3. Chronic renal insufficiency with creatinine in the range of 3.8      followed by Dr. Florene Glen.  4. Type 2 diabetes.  5. Obstructive sleep apnea.  6. Hyperlipidemia.  7. Elevated PSA.  8. Anemia.  9. Hypertension.   RECOMMENDATIONS:  Mr. Kosakowski appears to be doing better with his volume  status, but he may have some over diuresis since he appears totally dry  today and has had symptoms of weakness and muscle cramping.  He had a  BMP by Dr. Abel Presto office last week, and his potassium was adjusted  upwards.  We will plan to get a repeat BMP today and see if he needs  further potassium  supplement.  Will cut back on his diuretics from Lasix  two tablets in the morning, noon and night to eliminate the noon doses,  and will also cut back his Zaroxolyn from 2.5 daily to 2.5 every other  day.  We will give him Valium 5 mg p.r.n. for muscle cramps.  I will  plan to see him back in 6 weeks.     Bruce Alfonso Patten Olevia Perches, MD, [facility]  Electronically Signed    BRB/MedQ  DD: 05/10/2008  DT: 05/10/2008  Job #: [id number]   cc:   Darrold Span. Florene Glen, M.D.  Elayne Snare, M.D.

## 2011-05-12 NOTE — Consult Note (Signed)
NAMEWHITE, ENGELBERG               ACCOUNT NO.:  [id number]   MEDICAL RECORD NO.:  [id number]          PATIENT TYPE:  INP   LOCATION:  3729                         FACILITY:  [facility]   PHYSICIAN:  Shaune Pascal. Bensimhon, MDDATE OF BIRTH:  1941/08/12   DATE OF CONSULTATION:  10/18/2008  DATE OF DISCHARGE:                                 CONSULTATION   PRIMARY CARE PHYSICIAN:  Dr. Dwyane Dee.   NEPHROLOGIST:  Dr. Erling Cruz.   CARDIOLOGIST:  Dr. Olevia Perches and Dr. Percival Spanish.   REQUESTING PHYSICIAN:  [facility] hospitalist team.   REASON FOR CONSULTATION:  Progressive dyspnea.   Mr. Modesto is a very pleasant 70 year old male with multiple medical  problems including obesity, diabetes, hypertension, chronic renal  insufficiency with a baseline creatinine of 3.5 and diastolic heart  failure.  He was admitted September 07, 2008 with cholecystitis.  He  underwent a laparoscopic cholecystectomy.  His postop course was  complicated by acute renal failure with a peak creatinine of 7.9  requiring temporary hemodialysis.  He also had problems with atrial  fibrillation and a gallbladder fossa abscess requiring a drain.  He was  discharged on September 25, 2008.  At that time, creatinine was 4.8.  His Lasix and metolazone were put on hold.   He is now admitted from [facility] with a 2-week history of  increasing shortness of breath with a 20 pound weight gain.  He has  chronic two-pillow orthopnea and denies PND.  No chest pain.  He sleeps  with CPAP.  On admission chest x-ray showed diffuse CHF.  BNP was 926,  BUN was 61 with a creatinine of 4.3.   REVIEW OF SYSTEMS:  He has fatigue, shortness of breath and dyspnea on  exertion.  He has had just minimal lower extremity edema.  No  palpitations.  No syncope.  No presyncope.  He denies any, as above,  fevers, chills.  No neurologic complaints.  Remainder of review of  systems is negative except for HPI and problem list.   PROBLEM LIST:  1. History  of diastolic heart failure.  Echocardiogram January 2009 showed an EF of 55%.  Aortic valve was mild  to moderately calcified.  There was trivial aortic regurgitation.  1. Diabetes.  2. Chronic renal insufficiency.  Baseline creatinine from around 3.5-      4.      a.     Postoperative acute renal failure in September of 2009       requiring temporary dialysis.      b.     He has left upper extremity fistula in place.  3. Obesity.  4. Hypertension.  5. Anemia.  6. Sleep apnea on CPAP.  7. Recent cholecystitis status post laparoscopic cholecystectomy in      September of [id number] complicated by gallbladder fossa abscess.  8. Postop atrial fibrillation.   CURRENT MEDICATIONS:  1. Uroxatral 10 mg daily.  2. Norvasc 10 a day.  3. PhosLo.  4. Vibramycin.  5. Advair b.i.d.  6. Lasix 40 IV b.i.d.  7. Atrovent every 8.  8. Lopressor 50 b.i.d.  9.  Percocet.  10.Protonix.  11.Nephro-Vite.   ALLERGIES:  No known drug allergies.   SOCIAL HISTORY:  Lives in [place] alone.  He is retired from a [employer].  He used to smoke tobacco but quit 10 years ago.  Denies  alcohol or drug use.  Currently living in [facility].   FAMILY HISTORY:  Mother died at 46 of Alzheimer's.  Father died  at 56,  unknown cause.  He denies any family history of coronary artery disease.   PHYSICAL EXAM:  He is tachypneic and dyspneic at rest.  His respirations  are in the mid to high 20s.  His blood pressure 160/86.  He is satting  97% 2 liters.  Heart rate is 87.  He is afebrile.  HEENT:  Is normal.  NECK:  Thick and supple.  Neck veins appear distended but it is hard to  evaluate given his size.  Carotids are 2+ bilaterally with no audible  bruits.  There is no lymphadenopathy or thyromegaly.  CARDIAC:  PMI is not palpable.  He is regular with distant heart sounds.  He has a 2/6 systolic ejection murmur at the right sternal border as  well as an S4 gallop.  LUNGS:  Have mild crackles at the  bases.  ABDOMEN:  Is markedly obese, it is distended, nontender.  Unable to  appreciate a hepatosplenomegaly due to his girth.  No bruits or masses.  Hypoactive bowel sounds.  EXTREMITIES:  Warm with no cyanosis or clubbing.  There is trace edema.  There is also trace presacral edema.  NEURO:  Alert and x3.  Cranial nerves II-XII are intact.  Moves all  fours without difficulty.  Affect is pleasant.   Chest x-ray shows diffuse interstitial opacities, likely CHF.  EKG shows  sinus rhythm with a rate of 78 with no significant ST-T wave  abnormalities.  Sodium is 142, potassium 3.7, BUN 61, creatinine 4.3,  glucose 57, hemoglobin 74 with platelets are 208.  Hematocrit is 22.7  and white count is 8.2.  Cardiac markers are negative with a troponin  0.04.  BNP is 926, D-dimer is 3.0.   ASSESSMENT/PLAN:  Mr. Fahim appears to have acute on chronic diastolic  heart failure in the setting of advanced renal insufficiency and  discontinuation of his diuretics.  I agree with the plan for IV  diuresis.  However, given his renal insufficiency would increase Lasix  to 80 mg IV every 8.  We have given him an extra dose now.  If  he is not responding or his  kidney function is getting worse, we will  need to get renal involved for reinitiation of hemodialysis.  We will  follow with you.   Thank you for the consult.      Shaune Pascal. Bensimhon, MD  Electronically Signed     DRB/MEDQ  D:  10/18/2008  T:  10/18/2008  Job:  [id number]

## 2011-05-12 NOTE — Assessment & Plan Note (Signed)
[facility]                          CHRONIC HEART FAILURE NOTE   [name], [name]                      MRN:          [id number]  DATE:04/24/2008                            DOB:          01-Feb-1941    PRIMARY CARDIOLOGIST:  Dr. Eustace Quail.   PRIMARY CARE PHYSICIAN:  Elayne Snare.   NEPHROLOGY:  Mr. Touhey returns today for follow-up of his congestive  heart failure, which is secondary to diastolic dysfunction.  Since I  last saw [name], he has followed up with Dr. Florene Glen, underwent  placement of a left forearm graft by Dr. Trula Slade on March 19, 2008.  Mr.  Highsmith states everything went real well.  He had no problems, left arm  still mildly swollen and tender, but otherwise denies any complications,  no fever or chills.  He states he has been compliant with his  medications.  He saw Dr. Kumar 2 weeks ago, who increased his Lasix in  the morning to 240 mg.  He continues to 160 mg in the evening.  Mr.  Acree states that he cannot really tell any difference in his urine  output, if anything it seems like it has slowed down even more.  He  states he used to have to get up several times at night to void, now he  states he may get up once at night if that much.  He denies any  orthopnea or PND.  He complains of ongoing dyspnea on exertion.  Denies  any orthopnea, although he wears a CPAP q.h.s.  He states he has been  rather noncompliant with his diet lately, because of Easter Holiday,  etc.  He has been eating out a lot and has noted he has some increased  peripheral edema and abdominal distention.  He is also complaining of  seasonal allergies with a productive cough of clear white sputum.   PAST MEDICAL HISTORY:  The past medical history includes:  1. Congestive heart failure secondary diastolic dysfunction with a EF      55%.  2. Type 2 diabetes.  3. Dietary indiscretion.  4. Chronic kidney disease followed by Dr. Florene Glen, status post  recent      surgical procedure with insertion of left forearm graft.  5. Obstructive sleep apnea with compliance to CPAP.  6. Diabetic nephropathy.  7. Hyperlipidemia.  8. History of elevated PSA.  9. Anemia of chronic disease.  10.Secondary hyperparathyroidism.  11.Remote history of noncompliance.   REVIEW OF SYSTEMS:  As stated above, otherwise negative.   CURRENT MEDICATIONS:  Include:  1. Aspirin 81 daily.  2. Vytorin 10/40.  3. Amaryl 4.  4. Potassium 40 mEq daily.  5. Labetalol 400 mg b.i.d.  6. Lasix.  The patient states he has taken 80 mg.  He takes 3 tablets      in the morning and 2 tablets in the evening.  7. Norvasc 10 mg daily.  8. Hectorol 0.5 mg daily.  9. PhosLo 667 mg t.i.d.  10.Ferrous sulfate 325 mg daily.  11.Uroxatral one p.o. daily and calcium t.i.d.  P.r.n. medications include Zaroxolyn,  when instructed.   REVIEW OF SYSTEMS:  As stated above, otherwise negative.   PHYSICAL EXAM:  VITAL SIGNS:  Weight 208, weight is up 10 pounds from  March 5, blood pressure 166/70, heart rate 73.  [name] is in no  acute distress.  He has JVD around 8 cm.  LUNGS:  Clear to auscultation.  CARDIOVASCULAR:  Exam reveals an S1 and S2, regular rate and rhythm.  He  has a 3/6 systolic ejection murmur noted.  ABDOMEN:  Obese, soft,  nontender, positive bowel sounds, mildly distended.  LOWER EXTREMITIES:  The patient has +2 pitting edema bilaterally.  NEUROLOGIC:  Alert and oriented x3.   IMPRESSION:  Congestive heart failure with signs of volume overload at  this time, recent increase in Lasix dose, without much improvement in  fluid volume status.  However, in the setting of dietary indiscretion in  kidney failure, I am going to go ahead and start Mr. Savarino on  Zaroxolyn 2.5 mg every morning.  His last potassium was on the low side.  I am going to put him on 20 mEq.  He is going to take 2 tablets b.i.d.  and check blood work on him.  He states he has an appointment  with Dr.  Dwyane Dee next week.  I asked that Dr. Dwyane Dee go ahead and check a BMET and a  BMP in fax me the results, so that the patient does not have to make two  visits, and I will see him back in 3 weeks.      Rosanne Sack, ACNP  Electronically Signed      Minus Breeding, MD, [facility]  Electronically Signed   MB/MedQ  DD: 04/24/2008  DT: 04/24/2008  Job #: [id number]   cc:   Elayne Snare, M.D.  Darrold Span. Florene Glen, M.D.

## 2011-05-12 NOTE — Assessment & Plan Note (Signed)
[place]                            CARDIOLOGY OFFICE NOTE   [name], DAFOE                      MRN:          [id number]  DATE:06/21/2008                            DOB:          1941/02/15    I instructed him today that he increase his Lasix from 80 mg tablets 2  tablets b.i.d. to 2 tablets 3 times a day until his weight decreases 10  pounds on his home scale, which will be from 200 to 190.  At that time,  he can go back to two 80 mg tablets twice a day again.     Bruce Alfonso Patten Olevia Perches, MD, [facility]     BRB/MedQ  DD: 06/21/2008  DT: 06/22/2008  Job #: [id number]

## 2011-05-12 NOTE — Assessment & Plan Note (Signed)
[facility]                            CARDIOLOGY OFFICE NOTE   [name], [name]                      MRN:          [id number]  DATE:06/21/2008                            DOB:          [birth year]    PRIMARY CARE PHYSICIAN:  Elayne Snare, [name]   NEPHROLOGIST:  Darrold Span. Florene Glen, [name]   CLINICAL HISTORY:  [name] is 70 years old and returns for  management of his diastolic heart failure.  He has hypertension,  diabetes, and severe renal insufficiency with creatinines in the 4s, and  history of diastolic heart failure.  He was recently hospitalized for  this.  Sharyn Lull saw him last in the [facility] and increased  his diuretic, and he initially lost some weight, but then has gained the  weight back in more.  She also stated that he was 13 pounds overweight  on his last visit and he is 5 pounds up from that now.   PAST MEDICAL HISTORY:  Significant for obstructive sleep apnea,  hyperlipidemia, and elevated PSA.   CURRENT MEDICATIONS:  1. Glimepiride.  2. K-Dur 20 mEq 2 b.i.d.  3. Lasix 80 mg 2 b.i.d.  4. Metolazone 2.5 mg daily.  5. Labetalol 300 mg 2 tablets b.i.d.  6. Allopurinol 100 mg daily.  7. Uroxatral 1 daily.  8. Tekturna 100 mg daily.  9. Iron.  10.Gingko [place].  11.Aspirin.  12.Actos 50 mg daily.  13.Diazepam.  14.Amlodipine 10 mg daily.  15.Allegra and Vytorin 10/20 mg daily.   PHYSICAL EXAMINATION:  VITAL SIGNS:  Blood pressure 165/72 and pulse 91  and regular.  NECK:  The jugular venous pulsation was visible up to the jaw.  The  carotid pulses were full without bruits.  CHEST:  Clear.  CARDIAC:  Rhythm was regular.  There is 2/6 systolic ejection murmur.  ABDOMEN:  Protuberant.  There was no hepatosplenomegaly.  Bowel sounds  were normal.  EXTREMITIES:  There was 1 to 2+ peripheral edema.  Pedal pulses were  equal.   Electrocardiogram showed sinus rhythm and poor R-wave progression.   IMPRESSION:  1. Severe  chronic renal disease with creatinines in the 4s.  2. Diastolic heart failure/volume overload.  3. Good left ventricular systolic function with an ejection fraction      of 60%.  4. Type 2 diabetes.  5. Hyperlipidemia.  6. History of elevated prostate-specific antigen.   RECOMMENDATIONS:  [name] unfortunately has gained more weight and  has volume overload.  We will increase his Lasix from 80 mg 2 tablets  t.i.d. to 3 tablets t.i.d.  We will continue the Zaroxolyn at 2.5 daily.  He is to see Dr. Florene Glen next week.  He is going to assess his response  to therapy and make adjustments.  I will plan to see him back in 3  months.   I instructed him today that he increase his Lasix from 80 mg tablets 2  tablets b.i.d. to 2 tablets 3 times a day until his weight decreases 10  pounds on his home scale, which will be from 200 to 190.  At that time,  he can go back to two 80 mg tablets twice a day again.     Edward Alfonso Patten Olevia Perches, [name], [facility]  Electronically Signed    BRB/MedQ  DD: 06/21/2008  DT: 06/22/2008  Job #: [id number]   cc:   Darrold Span. Florene Glen, M.D.

## 2011-05-12 NOTE — Assessment & Plan Note (Signed)
[facility]                            CARDIOLOGY OFFICE NOTE   NIEVES, JESSEE                      MRN:          [id number]  DATE:12/12/2008                            DOB:          03-Apr-1941    PRIMARY CARE PHYSICIAN:  Elayne Snare, MD   NEPHROLOGIST:  Elzie Rings. Lorrene Reid, MD   CLINICAL HISTORY:  Mr. Flaig is 70 years old and returned for followup  management of diastolic heart failure after his recent hospitalization  for end-stage renal disease and initiation of dialysis.  He was admitted  with progressive shortness of breath in October and his creatinines went  up to 4 and 7 and he was placed on chronic dialysis.  His fluid volume  was managed with dialysis.  He also was found to have paroxysmal atrial  fibrillation and was started on amiodarone and Coumadin.  He was  discharged from the hospital to [facility] for  rehabilitation.   He said he has done fairly well since discharge and had no chest pain  and no problems with volume excess.  He says he has had symptoms of  fatigue and he said his blood pressures have been low with dialysis.   PAST MEDICAL HISTORY:  Significant for hypertension, diabetes, and sleep  apnea.  He also had a recent cholecystectomy which preceded some of his  worsening heart failure and renal insufficiency.   CURRENT MEDICATIONS:  1. Aspirin.  2. Nephro-Vite.  3. Uroxatral  4. Amiodarone 200 mg daily.  5. Metoprolol 25 mg t.i.d. holding on the day of dialysis.  6. Isosorbide 30 mg at bedtime.  7. Warfarin.   PHYSICAL EXAMINATION:  VITAL SIGNS:  The blood pressure was 102/54 and  pulse 55 and regular.  NECK:  There was no venous distention.  Carotid pulses were full without  bruits.  CHEST:  Clear.  HEART:  Rhythm is regular.  There was a short systolic murmur at the  left sternal edge.  ABDOMEN:  Soft with normal bowel sounds.  No hepatosplenomegaly.  EXTREMITIES:  There is trace  peripheral edema and pedal pulses equal.   An electrocardiogram today shows sinus rhythm with poor R-wave  progression and borderline QT.   IMPRESSION:  1. Diastolic heart failure now controlled with dialysis.  2. End-stage renal disease with recent initiation of dialysis.  3. Hypertension.  4. Paroxysmal atrial fibrillation holding sinus rhythm on amiodarone      and Coumadin.  5. Diabetes.  6. Obstructive sleep apnea.  7. Status post recent cholecystectomy.  8. Respiratory failure status post ventilatory support, resolved.   RECOMMENDATIONS:  I think, Mr. Carrisalez is doing well at this point.  We  will get blood work to check for amiodarone surveillance including liver  function tests, TSH, and CBC.  We will not make any changes in his  medications.  We will see him back in followup in 6 months.     Bruce Alfonso Patten Olevia Perches, MD, [facility]  Electronically Signed    BRB/MedQ  DD: 12/12/2008  DT: 12/13/2008  Job #: [id number]

## 2011-05-12 NOTE — Op Note (Signed)
NAMEBUDD, LUCKE               ACCOUNT NO.:  [id number]   MEDICAL RECORD NO.:  [id number]          PATIENT TYPE:  INP   LOCATION:  4531                         FACILITY:  [place]   PHYSICIAN:  Jeryl Columbia, M.D.    DATE OF BIRTH:  [birth year]   DATE OF PROCEDURE:  DATE OF DISCHARGE:                               OPERATIVE REPORT   PROCEDURE:  Colonoscopy with biopsy and polypectomy.   INDICATION:  The patient with anemia, guaiac-positivity, nondiagnostic  endo, well overdue for colonic screening.  Consent was signed after  risks, benefits, methods, and options thoroughly discussed.   MEDICINES USED:  During this hospital stay,  1. Fentanyl 60 mcg.  2. Versed 5 mg.   PROCEDURE:  Rectal inspection is pertinent for external hemorrhoids,  small.  Digital exam was negative.  The video colonoscope was inserted  and easily advanced to the mid ascending.  On insertion, no signs of  bleeding were seen.  There were some rare early left-sided diverticula  seen primarily in the sigmoid.  A small mid transverse polyp was seen  and would be elected to remove it or withdraw, to advance to the cecum  requiring abdominal pressure.  The cecum was identified by the  appendiceal orifice and the ileocecal valve.  The scope was slowly  withdrawn.  In the ascending, a rare diverticula were seen and the cecum  was normal.  The scope was further withdrawn.  In the hepatic flexure, a  small polyp was seen.  Unfortunately, with increased work of breathing  and colon movement and inability to hold air and lots of spasm, it was  difficult to locate and pulling back to the proximal transverse another  small polyps was seen.  Snare electrocautery was applied and the polyp  was removed, suctioned through the scope and collected in the trap.  Again in trying to remove the hepatic flexure polyp, another polyp was  seen.  It was snared.  Unfortunately, the snare cut through it but there  were no signs of  bleeding and no signs of residual polyp.  This was  suctioned through the scope and collected in the trap.  We then went  ahead and readvanced to the hepatic flexure and we were able to hot  biopsy this small polyp x1 without signs of bleeding.  The scope was  further withdrawn, one other distal small polyp was seen.  Snare  electrocautery applied and was suctioned through the scope and collected  in the trap.  Two other tiny polyps in the transverse and proximal  descending were seen and were cold biopsied x1 and put in the same  container with the other polyps.  As the scope was withdrawn around the  left side of the colon, visualization was more difficult due to  increased spasm, difficulty to hold there.  We did see a tiny mid  proximal sigmoid polyp, which was cold biopsied and put in a separate  container.  The scope was further withdrawn, anorectal pullthrough and  retroflexion confirmed some small hemorrhoids.  Scope was straightened  and readvanced towards the  left side of the colon, air was suctioned,  and scope removed.  The patient tolerated the procedure well.  There was  no obvious immediate complication.   ENDOSCOPIC DIAGNOSES:  1. Internal and external hemorrhoids.  2. Rare early sigmoid and ascending diverticula.  3. No blood seen.  4. Tiny proximal to mid sigmoid polyps cold biopsied.  5. Few tiny descending and transverse polyps cold biopsied.  6. Tiny to small hepatic flexure polyp hot biopsied.  7. 3 transverse polyps snared, 1 cold snared, 2 hot snared.  8. Otherwise, within normal limits to the cecum without signs of any      bleeding or complication.   PLAN:  Await pathology to determine colonic screening, but probably  recheck a little sooner based on increased spasm, difficulty holding  air, and increased movements with work of breathing, possibility of  lesions being missed.  We will try to hold than to minimize blood  thinners for a week if possible per  the primary team due to the risk of  postpolypectomy bleeding.  In the future, if signs of hemoglobin loss,  GI bleeding, consider CAT scan, enterography, regular CAT scan, or small  bowel capsule.  We will follow with you.  Dr. Amedeo Plenty is on-call this  weekend and to call if any further questions or problems.           ______________________________  Jeryl Columbia, M.D.     MEM/MEDQ  D:  01/04/2009  T:  01/05/2009  Job:  [id number]   cc:   Elayne Snare, M.D.  Windy Kalata, M.D.

## 2011-05-14 ENCOUNTER — Ambulatory Visit (INDEPENDENT_AMBULATORY_CARE_PROVIDER_SITE_OTHER): Payer: Medicare Other | Admitting: Internal Medicine

## 2011-05-14 DIAGNOSIS — R0609 Other forms of dyspnea: Secondary | ICD-10-CM

## 2011-05-14 DIAGNOSIS — I4891 Unspecified atrial fibrillation: Secondary | ICD-10-CM

## 2011-05-14 DIAGNOSIS — R0989 Other specified symptoms and signs involving the circulatory and respiratory systems: Secondary | ICD-10-CM

## 2011-05-14 LAB — PULMONARY FUNCTION TEST

## 2011-05-14 NOTE — Progress Notes (Signed)
PFT done today. 

## 2011-05-15 ENCOUNTER — Ambulatory Visit (INDEPENDENT_AMBULATORY_CARE_PROVIDER_SITE_OTHER): Payer: Self-pay

## 2011-05-15 DIAGNOSIS — R0989 Other specified symptoms and signs involving the circulatory and respiratory systems: Secondary | ICD-10-CM

## 2011-05-15 NOTE — Discharge Summary (Signed)
[name], [name]               ACCOUNT NO.:  [id number]   MEDICAL RECORD NO.:  [id number]          PATIENT TYPE:  INP   LOCATION:  2024                         FACILITY:  [facility]   PHYSICIAN:  Elayne Snare, M.D.       DATE OF BIRTH:  11-18-1941   DATE OF ADMISSION:  11/07/2004  DATE OF DISCHARGE:  11/10/2004                                 DISCHARGE SUMMARY   HISTORY:  The patient was admitted with shortness of breath from the day  prior to admission.  The patient did not have any other associated symptoms  but in the office was found to have gained weight.  He has had a previous  history of CHF in July 2005, and was felt to have diastolic dysfunction.   PHYSICAL EXAMINATION:  WEIGHT:  227.  PULSE:  84.  RESPIRATIONS:  He was dyspneic at rest.  BLOOD PRESSURE:  150/75.  GENERAL:  He had 2+ edema.  The rest of the exam was unremarkable, except  for ejection murmur at the apex, and a few right basal crepitations.   Chest x-ray in the office showed congestive heart failure.   HOSPITAL COURSE:  He was given intravenous Lasix 160 b.i.d., with which he  had reduction in his weight and improvement of his edema.  His blood  pressure also gradually came down, and his creatinine stabilized at about  2.1.  His cardiac enzymes were slightly positive, but cardiology felt that  this was not new and more related to renal insufficiency and CHF.  On  November 10, 2004, he was felt to be ready for discharge, and he was  discharged on increased diuretics.   DISCHARGE DIAGNOSES:  1.  Congestive heart failure and fluid overload.  2.  Diastolic dysfunction.  3.  Diabetic nephropathy, with moderate renal impairment.  4.  Mild obesity.  5.  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   DISCHARGE INSTRUCTIONS:  To continue all home medications, with the  exception of Lasix to be 1-1/2 b.i.d., and potassium three daily, and  hydralazine to be three times a day also.   FOLLOWUP:  In the office in one  week, and with the [facility]  as directed.  He was also instructed to continue monitoring his blood sugars  and follow a low-salt diet.       AK/MEDQ  D:  11/10/2004  T:  11/10/2004  Job:  [id number]   cc:   Kirk Ruths, M.D. [facility]

## 2011-05-15 NOTE — Op Note (Signed)
[name], [name]               ACCOUNT NO.:  [id number]   MEDICAL RECORD NO.:  [id number]          PATIENT TYPE:  AMB   LOCATION:  SDS                          FACILITY:  [facility]   PHYSICIAN:  John D. Zigmund Daniel, M.D. 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   PROCEDURES:  1. Pars plana vitrectomy with 25-gauge system.  2. Panretinal photocoagulation.  3. Removal of intraocular lens from the vitreous.  4. Placement of secondary intraocular lens with suture.   SURGEON:  Tempie Hoist, MD   ASSISTANT:  Shirleen Schirmer, PA   ANESTHESIA:  General.   DETAILS:  Usual prep and drape, peritomy from 8 o'clock around to 4  o'clock.  A 1/2-thickness, 1-mm square flap was raised at 3 o'clock and  9 o'clock in anticipation of IOL suture.  A 3-layered corneal wound was  performed.  The 25-gauge trocars were placed at 10, 2 and 4 o'clock.  Pars plana vitrectomy was begun in the pupillary axis.  The Biome  viewing system was moved into place and the Provisc was placed on the  corneal surface.  Large chunks of white material and capsular material  were seen in the vitreous cavity; these were carefully removed under low  suction and rapid cutting.  Literally hundreds of small, tiny cortical  flecks were seen floating in the vitreous and the anterior chamber.  Since this was essentially a single-chambered eye at this point, all  vitreous and lens remnants were removed.  The endolaser was positioned  in the eye; 519 burns were placed around the retinal periphery with a  power of 1000 milliwatts, 1000 microns each and 0.1 seconds each.  The  intraocular lens was freed from its entanglement in the vitreous.  The  intraocular lens was passed from the vitreous  cavity through the pupil  and out the corneoscleral wound.  The lens was sent for identification.  Two Prolene sutures were placed from 3 o'clock to 9 o'clock behind the  iris and in the ciliary sulcus.  The sutures were externalized through  the corneoscleral wound.  A new intraocular lens was brought on the  table, model CZ70BD, [place], power 22.5D, length 12.5  mm, optic 7.0 mm, serial number [id number], expiration date February of  2012.  The lens was inspected and cleaned.  Sutures were attached to the  eyelets of the lens.  The lens was passed through the anterior chamber  and pupil into the ciliary sulcus.  The sutures were drawn securely  externally and tied.  The implant was adjusted in the ciliary sulcus.  Additional vitrectomy was carried out at this point, removing some  cloudy vitreous.  The corneoscleral wound was closed with 5 interrupted  10-0 nylon sutures.  It was tested and found to be tight.  The 25-gauge  trocars were removed from the eye.  The closing pressure was  15 with a  [place] keratometer.  The conjunctiva was reposited with 7-0 chromic  suture.  Polymyxin and gentamicin were irrigated into Tenon's space.  No  atropine was used.  Marcaine was injected around the globe for postop  pain.  Decadron 10 mg was injected into the lower subconjunctival space.  TobraDex ophthalmic ointment, a patch and shield were placed.  The  patient was awakened and taken to Recovery in satisfactory condition.   COMPLICATIONS:  None.   DURATION:  One hour.      Chrystie Nose. Zigmund Daniel, M.D.  Electronically Signed     JDM/MEDQ  D:  12/02/2006  T:  12/03/2006  Job:  [id number]

## 2011-05-15 NOTE — Discharge Summary (Signed)
NAME:  [name], [name]                         ACCOUNT NO.:  [id number]   MEDICAL RECORD NO.:  [id number]                   PATIENT TYPE:  INP   LOCATION:  3740                                 FACILITY:  [facility]   PHYSICIAN:  Ernestine Mcmurray, M.D. [facility]             DATE OF BIRTH:  1941/07/26   DATE OF ADMISSION:  DATE OF DISCHARGE:  07/16/2004                                 DISCHARGE SUMMARY   PROCEDURE:  Adenosine Cardiolite.   HOSPITAL COURSE:  [name] is a 70 year old male with a history of  hypertension and diabetes, chronic renal insufficiency.  The patient was  admitted to the hospital on July 09, 2004 for increasing shortness of  breath.  He was also markedly hypertensive with a systolic blood pressure of  220/106.  The patient was admitted for further evaluation and management and  a renal consult was called for BUN of 58 with creatinine of 6.9.   As part of his cardiac evaluation, enzymes were done which were negative for  MI.  Additionally, a 2-D echocardiogram was performed and it showed an EF of  55-65% with abnormal left ventricular relaxation.  Peak pulmonary artery  systolic pressure was elevated slightly at 37 mmHg.  It was felt that a  Cardiolite was adequate to evaluate him for ischemia, once his enzymes were  negative for MI. This was performed on July 15, 2004.  The Cardiolite was  negative for ischemia or infarct, and his EF was 64%. It was not felt that  further ischemic work-up was indicated at this time, but he will follow-up  with cardiology at least once in the office.   A renal consult was called for increased renal insufficiency.  He had an MRA  to look for renal artery stenosis but this was negative.  His diuretics were  adjusted by Dr. Florene Glen, and by discharge he was taking Lasix 160 mg p.o.  b.i.d.  He is not being discharged on potassium supplementation at this  time, but he will need close follow-up as an outpatient.  His creatinine at  discharge was  3.8 with BUN of 55.  It was felt that this was his baseline.   An endocrine consult was called for further medical management.  His Avandia  was discontinued.  He is to continue Amaryl.  He is to follow-up with Dr.  Dwyane Dee as scheduled.  His blood sugars, once his medications were adjusted,  were under fairly good control.   By July 16, 2004, [name]' weight had decreased from 219 to 204.  He was  feeling much better. He is to get IV iron and follow-up with the renal team.  His blood pressure medications have been adjusted and his blood pressure is  under much better control.  Dr. Nathaneil Canary is considered stable for discharge  once his iron infusion is finished, and he is to follow-up with Dr. Dwyane Dee,  with Dr.  DeGent and with Dr. Florene Glen as an outpatient.   CONDITION ON DISCHARGE:  Improved.   DISCHARGE DIAGNOSES:  1. Hypertension.  2. Obstructive sleep apnea.  3. Diabetes.  4. Elevated prostate specific antigen with microhematuria.  5. Congestive heart failure.  6. Renal insufficiency.  7. Preserved left ventricular function by echocardiogram this admission and     Cardiolite without ischemia.  8. Hyperlipidemia.   DISCHARGE INSTRUCTIONS:  1. His activity level is to be as tolerated.  2. He is to stick to a diabetic renal diet.  3. He is to weigh himself daily.  4. He has an appointment with the P.A. for Dr. Dannielle Burn on August 8 at 10:00     a.m.  He is to call Dr. Florene Glen for an appointment and keep his     appointment with Dr. Dwyane Dee.   DISCHARGE MEDICATIONS:  1. Vytorin 10/80 mg daily.  2. Labetalol 400 mg b.i.d.  3. Catapres 0.2 mg 2 times a day.  4. Aspirin 81 mg daily.  5. Imdur 60 mg daily.  6. Hydralazine 25 mg t.i.d.  7. Amaryl 6 mg daily.  8. Ditropan 5 mg t.i.d.  9. Lasix 80 mg 2 tabs b.i.d.  10.      Flonase.  11.      Ginseng and ginkgo [place] as prior to admission.  He is not to take Avandia.      Rosaria Ferries, P.A. [facility]                  Ernestine Mcmurray,  M.D. [facility]    RB/MEDQ  D:  07/16/2004  T:  07/16/2004  Job:  [id number]   cc:   Elayne Snare, M.D.  [id number] [place]  [place] [address]  Fax: [place] Florene Glen, M.D.  [address]  [place]  [place] [address]  Fax: [phone]

## 2011-05-15 NOTE — Consult Note (Signed)
NAMEYOTAM, JOINTER               ACCOUNT NO.:  [id number]   MEDICAL RECORD NO.:  [id number]          PATIENT TYPE:  INP   LOCATION:  2024                         FACILITY:  [facility]   PHYSICIAN:  Kirk Ruths, M.D. [facility] OF BIRTH:  30-Jan-1941   DATE OF CONSULTATION:  DATE OF DISCHARGE:                                   CONSULTATION   HISTORY OF PRESENT ILLNESS:  Mr. Clemensen is a pleasant 70 year old male with  a past medical history of diabetes mellitus, hypertension, hyperlipidemia,  renal insufficiency, sleep apnea, and history of CHF felt secondary to  volume overload/renal insufficiency/diastolic dysfunction who presents with  recurrent CHF.  The patient was admitted in 7/05 with congestive heart  failure.  He was found to have renal insufficiency with a creatinine of 6.9  and a BUN of 58.  An echocardiogram at that time revealed normal LV function  but there was diastolic dysfunction.  A Cardiolite revealed an ejection  fraction of 64 but there was no infarct or ischemia.  He subsequently had an  MRA of his renal arteries that showed no significant abnormality by report.  He has been treated with blood pressure medications and diuretics and he did  improve.  He did well at home with significant improvement in his CHF  symptoms.  On 08/04/04, he was seen in the office and was complaining of  dizziness at times.  He was felt to have orthostatic hypotension and his  Lasix was decreased from 160 mg b.i.d. to 80 mg p.o. b.i.d.  His Enbrel was  also decreased to 30 mg p.o. daily.  Over the past 7-10 days, he had noticed  worsening pedal edema.  Over the past 48 hours, he has noticed worsening  dyspnea on exertion, orthopnea, and PND.  He has not had chest pain.  He was  seen by Dr. Dwyane Dee today and admitted for congestive heart failure.  Apparently, chest x-ray at the office revealed CHF.   LABORATORY DATA:  His laboratories are pending.   MEDICATIONS:  His medications prior to  admission include:  1.  Amaryl 8 mg p.o. daily.  2.  Labetalol 300 mg p.o. b.i.d.  3.  Clonidine 0.2 mg p.o. b.i.d.  4.  Hydralazine 50 mg p.o. b.i.d.  5.  Aspirin 81 mg p.o. daily.  6.  Vytorin 10/80 daily.  7.  Benicar 40 mg p.o. daily.  8.  Lasix 80 mg p.o. b.i.d.  9.  Potassium.  10. Sleep apnea.   ALLERGIES:  CPAP.   SOCIAL HISTORY:  He has a remote history of tobacco use but none in the past  8 years.  He does not consume alcohol.   FAMILY HISTORY:  Negative for coronary artery disease.   PAST MEDICAL HISTORY:  Significant for hypertension, hyperlipidemia, as well  as diabetes mellitus.  He does have sleep apnea.  He has a history of  diastolic dysfunction.  He also has a history of renal insufficiency.  He  has had prior tonsillectomy.   REVIEW OF SYSTEMS:  He denies any headaches, fevers, or chills.  There is no  productive cough or hemoptysis.  No dysphagia, odynophagia, melena, or  hematochezia.  There is no dysuria, or hematuria.  There is no rash or  seizure activity.  He does have orthopnea and PND as well as pedal edema.  There is no chest pain.  There is no claudication.  The remaining systems  are negative.   PHYSICAL EXAMINATION:  Vital signs: Blood pressure 168/89, pulse 79,  temperature 99.6.  General: He is well-developed and somewhat obese.  He is  in no acute distress.  Skin: Warm and dry.  He is not __________ depressed  and there is no peripheral clubbing.  HEENT: Unremarkable with normal EOM's.  Neck: Supple, symmetric bilaterally.  I could not appreciate bruits.  Chest:  Diminished breath sounds at the bases with basilar rales.  Cardiovascular:  Regular rate and rhythm.  There is a [id number] systolic ejection murmur at the  left sternal border.  Abdomen: Nontender, positive bowel sounds, no  hepatosplenomegaly and no mass appreciated.  No abdominal bruits.  Femoral  pulses are difficult to palpate.  Extremities: Chronic skin changes and  there was 2-3+  edema to the knees bilaterally.  His distal pulses are  diminished.  I could not palpate cords.  Neurologic: Grossly intact.   STUDIES:  His electrocardiogram from Dr. Ronnie Derby office reveals a normal  sinus rhythm.  The axis is normal.  There is a prior septal infarct versus  lead placement but there are no acute ST changes noted.  A chest x-ray  reportedly shows congestive heart failure.  The rest of the laboratories are  pending.   DIAGNOSES:  1.  Recurrent volume overload/congestive heart failure.  2.  History of hypertension.  3.  Diabetes mellitus.  4.  Hyperlipidemia.  5.  History of renal insufficiency.  6.  Sleep apnea.  7.  History of diastolic dysfunction.   PLAN:  Mr. Geissler presents with recurrent volume overload.  The etiology is  not clear to me.  This may be secondary to worsening renal insufficiency and  we will check a basic metabolic panel.  I will hold his Benicar for now  until his renal function returns.  We will increase his Lasix to 160 mg p.o.  b.i.d. for improve diuresis.  We will need to follow his renal function  closely as well as his in's and out's and daily weights.  I will recheck his  echocardiogram to requantify his LV function and to exclude diastolic  dysfunction.  We will need to adjust his medicines to control his blood  pressure.  If his LV function is normal, and his enzymes are negative, then  I would not pursue further cardiac testing given the recent Cardiolite, no  chest pain, and no echocardiographic changes.  Of note, his troponin was  mildly elevated previously but this certainly may be related to increasing  renal insufficiency or congestive heart failure.  If there is no clear trend  up, then again, I would not pursue cardiac catheterization as there would be  a high risk of contrast nephropathy.  We will follow while he is in the  hospital.       BC/MEDQ  D:  11/07/2004  T:  11/08/2004  Job:  [id number]

## 2011-05-15 NOTE — Consult Note (Signed)
NAME:  [name], [name]                         ACCOUNT NO.:  [id number]   MEDICAL RECORD NO.:  [id number]                   PATIENT TYPE:  INP   LOCATION:  [place]                                 FACILITY:  [facility]   PHYSICIAN:  Ernestine Mcmurray, M.D. [facility]             DATE OF BIRTH:  05/25/1941   DATE OF CONSULTATION:  07/09/2004  DATE OF DISCHARGE:                                   CONSULTATION   CURRENT COMPLAINTS:  Shortness of breath and congestive heart failure.   HISTORY OF PRESENT ILLNESS:  Mr. Bonomo is a 70 year old African-American  male followed by Dr. Dwyane Dee with underlying hypertension and diabetes  mellitus and chronic renal insufficiency. The patient now is admitted after  he developed worsening symptoms of shortness of breath after undergoing an  MRA today to rule out renal artery stenosis. The patient has a longstanding  history of hypertension but recently worsened. The patient was seen on  Friday in Dr. Ronnie Derby office and was found to be markedly hypertensive.  Minoxidil was started at that time. Although the patient came for routine  visit, he did report that for the last two months, he has had  gradual  increase in shortness of breath and leg edema. Over the weekend on  minoxidil, the patient became nauseated, gained four pounds in weight and  became progressively short of breath. On Monday, he was then seen again and  minoxidil was discontinued and changed to Cardizem despite the patient  continued to be short of breath. In the emergency room, his blood pressure  remains largely uncontrolled, being 220/106. Of note, i-STAT, his creatinine  is 6.9. Chest x-ray does demonstrate congestive heart failure. The patient  also reports leg edema which has got only worse over the last several days.  His nausea and vomiting has now resolved. He does have orthopnea and PND. He  also has underlying sleep apnea and uses CPAP machine at home at night.   ALLERGIES:  No known drug  allergies.   MEDICATIONS:  1. Benicar 40 mg a day, started two months ago.  2. Amaryl 4 mg a day.  3. Avandia.  4. Cardizem 420 mg a day.  5. Vytorin 10/80 mg daily.  6. Labetalol 300 mg p.o. b.i.d.  7. Clonidine 0.2 mg p.o. b.i.d.   PAST MEDICAL HISTORY:  Per records from Dr. Ronnie Derby office:  1. Obstructive sleep apnea, on CPAP with 5 cm of water.  2. Diabetes mellitus.  3. Hypertension.  4. Mild renal insufficiency.   SOCIAL HISTORY:  The patient lives in [place] by himself. He is a Engineer, manufacturing systems. He does not smoke. He does not drink alcohol.   FAMILY HISTORY:  Notable for CAD.   REVIEW OF SYSTEMS:  No fever or chills, no headache or sore throat. Positive  for shortness of breath, dyspnea on exertion, orthopnea and PND, but no  substernal chest pain. No frequency or  dysuria. No myalgia and arthralgia.  Nausea and vomiting have resolved after stopping minoxidil. No polyuria or  polydipsia.   PHYSICAL EXAMINATION:  VITAL SIGNS:  Blood pressure 220/106, heart rate 92  beats per minute, temperature afebrile.  GENERAL:  Obese, African-American male in mild respiratory distress.  HEENT:  [place]/AT, PERRLA.  NECK:  Positive JVD. Positive hepatojugular reflex. Neck supple.  HEART:  Regular rate and rhythm, normal S1, S2, a pathological murmur  positive at S3.  LUNGS:  Crackles bilaterally and diminished at the bases.  SKIN:  No rash or lesions.  ABDOMEN:  Distended, nontender, no rebound or guarding.  GENITOURINARY/RECTAL:  Deferred.  EXTREMITIES:  2+ peripheral edema.  NEUROLOGIC:  The patient is alert and oriented x3, nonfocal.   DIAGNOSTIC STUDIES:  Chest x-ray, cardiomegaly with congestive heart  failure, EKG, normal sinus rhythm. Q-waves in V1-V2, cannot rule out old  anteroseptal myocardial infarction with no acute changes. Hemoglobin 13.9,  hematocrit 41, BUN 58, creatinine 6.9, sodium 136, potassium 4.1. Troponin  0.3 with the second one 0.47. BNP 434, IVG 783. PCO2  41, bicarbonate 24.7.   IMPRESSION AND PLAN:  Problem 1. Congestive heart failure.  The patient has  new onset congestive heart failure. I doubt this is secondary to an acute  coronary syndrome. 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 Will get an  echocardiographic study to evaluate LV function and rule out segmental wall  motion abnormalities. Despite mild elevation of cardiac troponin, I do not  think this patient has an acute coronary syndrome but rather secondary to  volume overload. He has multiple reasons to have developed increased volume  overload particularly in the setting of his renal insufficiency, poor-  controlled blood pressure and salt in water retention with minoxidil and  subsequent contrast administration. I do feel the patient will need  diuresis. At this point in time, we have given him a dose of 160 mg of  Lasix. He also needs aggressive blood pressure control. I doubt that Benicar  is contributing to this as he has been several months on this drug, but I  did hold it for now. Cardizem was also held. Will manage his blood pressure  with hydralazine and nitroglycerin in addition to his other medications.  Avandia has also been discontinued so we will place him on a sliding scale  to avoid volume overload.   Problem 2. Renal insufficiency.  Creatinine from 2.1-6.9. Will repeat a BMET  to make sure this is a serious value. We have called Dr. Erling Cruz to  consult. The patient does not have hydronephrosis. After placing a Foley  catheter, he has approximately 500 cubic centimeters of urine. I also do not  think the patient has glomerulonephritis but rather has worsening renal  insufficiencies secondary to hypertensive crisis.   Problem 3. Hypertensive crisis.  Management as outlined above. Will  definitely stop minoxidil as this is contributing to his volume overload  status.  Problem  4. Rule out ischemic heart disease. Will repeat cardiac troponins  and echocardiographic study. If the patient does have significant LV  dysfunction and/or wall motion abnormalities, further workup may be required  but no acute indication for catheterization, particularly in light of his  elevated creatinine.   DISPOSITION:  The patient will be admitted and consult was called for Dr.  Erling Cruz, and Dr. Elyse Hsu will manage the patient's blood sugars.  Ernestine Mcmurray, M.D. [facility]    GED/MEDQ  D:  07/09/2004  T:  07/09/2004  Job:  [id number]   cc:   Elayne Snare, M.D.  [id number] [address] [place] [address]  Fax: Peever Altheimer, M.D.  [id number] [address], [address] [place] [address]  Fax: [phone]   Clinton D. Young, M.D.  [address] [place]  [place] [address]  Fax: [place] Florene Glen, M.D.  [address]  [place]  [place] [address]  Fax: [phone]

## 2011-05-15 NOTE — Op Note (Signed)
NAMEZACARRI, VAILLANT               ACCOUNT NO.:  [id number]   MEDICAL RECORD NO.:  [id number]          PATIENT TYPE:  OIB   LOCATION:  2550                         FACILITY:  [facility]   PHYSICIAN:  Chrystie Nose. Zigmund Daniel, M.D. DATE OF BIRTH:  04-03-1941   DATE OF PROCEDURE:  06/16/2005  DATE OF DISCHARGE:                                 OPERATIVE REPORT   ADMISSION DIAGNOSIS:  Vitreous hemorrhage of posterior vitreous membranes  and retinal traction in the left eye.   PROCEDURES:  Pars plana vitrectomy with membrane peel, retinal  photocoagulation and gas injection, left eye.   DETAILS:  Usual prep and drape with 25 gauge trocars at 10, 2 and 4 o'clock.  Infusion port at 4 o'clock. Provisc placed on the corneal surface. Biome  moved into place for viewing and lighted pick and cutter placed at 10 and 2  o'clock, respectively. The pars plana vitrectomy was begun just behind the  crystalline lens where a large stalk of vitreous hemorrhage and white  vitreous debris was encountered. This was carefully removed under low  suction and rapid cutting. The posterior hyaloid was broken through with the  vitreous cutter and the lighted pick and peeled from its attachments to the  disk, macula and retina. Once this was lifted up, the vitreous cutter was  used to remove all vitreous fragments. The vitrectomy was carried out in the  peripheral vitreous space where scleral depression was used to gain access  to the vitreous base and trimmed it for 360 degrees. Once this was  accomplished, the endolaser was positioned in the eye and 802 burns placed  around the retinal periphery with a power of 1000 milliwatts, 1000 microns  each and 0.1 seconds each. A washout procedure was performed and 25 gauge  forceps were used. A gas/fluid exchange was carried out for 50% of the  vitreous cavity. The trocars were removed and the wound was tested for  tightness. Polymyxin and gentamicin were irrigated into tenon's  space.  Decadron 10 mg was injected into the lower subconjunctival space. Marcaine  was injected around the globe for postoperative pain. The closing tension  was 8 with the Barraquer tonometer. TobraDex ophthalmic ointment and a patch  and shield were placed. The patient was awakened and taken to recovery in  satisfactory condition.       JDM/MEDQ  D:  06/16/2005  T:  06/16/2005  Job:  [id number]

## 2011-05-15 NOTE — Assessment & Plan Note (Signed)
OFFICE VISIT   ROBERTS, LARRALDE  DOB:  [birth year]                                       01/01/2011  [id number]   The patient was sent for evaluation of a bruit by Dr. Mercy Moore.  The  patient was a no-show for the office visit.     Jessy Oto. Fields, MD  Electronically Signed   CEF/MEDQ  D:  01/01/2011  T:  01/02/2011  Job:  [id number]   cc:   Windy Kalata, M.D.

## 2011-05-15 NOTE — Consult Note (Signed)
[name], [name]                         ACCOUNT NO.:  [id number]   MEDICAL RECORD NO.:  [id number]                   PATIENT TYPE:  INP   LOCATION:  3733                                 FACILITY:  [facility]   PHYSICIAN:  Darrold Span. Florene Glen, M.D.               DATE OF BIRTH:  11-12-1941   DATE OF CONSULTATION:  DATE OF DISCHARGE:                                   CONSULTATION   CONSULTING PHYSICIAN:  Darrold Span. Florene Glen, M.D.   REFERRING PHYSICIAN:  Ernestine Mcmurray, M.D. [facility]   I was asked by Dr. Dannielle Burn to see this 70 year old male, patient of Dr.  Ronnie Derby, with a longstanding history of diabetes mellitus, with a serum  creatinine of 2.1 mg/dL, on July 04, 2004, who was admitted today with  congestive heart failure, after starting Benicar approximately two months  ago and minoxidil five days ago when he presented with severe hypertension.  The patient had an MRA today which revealed patent renal arteries  bilaterally and normal sized kidneys, and subsequently presented to the  emergency room at [facility] with congestive heart failure  symptomatology.  Laboratory studies revealed a BUN of 58 and a creatinine of  6.9 mg/dL, and he is being admitted for treatment.   PAST MEDICAL HISTORY:  1. Obstructive sleep apnea, requiring CPAP.  2. Diabetes mellitus.  3. Hypertension.  4. Elevated PSA and microhematuria.   SOCIAL HISTORY:  He is a nonsmoker and does not consume alcoholic beverages.  He is employed by [employer].   REVIEW OF SYSTEMS:  Remarkable for annual eye exams but has never received  laser eye treatments.   PHYSICAL EXAMINATION:  VITAL SIGNS:  Blood pressure is 220/106 on  presentation.  GENERAL:  Acutely ill-appearing African American male.  He is moderately  obese.  HEENT:  Bulging neck veins bilaterally.  LUNGS:  Bilateral rales one third of the way up.  HEART:  Regular rhythm and rate.  Tachycardic.  ABDOMEN:  Obese, protuberant.  No palpable  hepatosplenomegaly.  There is 2+  presacral edema.  EXTREMITIES:  With 2+ to 3+ bilateral lower extremity edema and chronic  changes are present.  NEUROLOGIC:  He is oriented in all spheres with no obvious focal deficit.   Chest x-ray reveals congestive heart failure.  MRA:  No evidence of renal  artery stenosis, questionable left adrenal nodule.   CURRENT LABORATORY STUDIES:  Reveal a sodium of 139, potassium 4.9, chloride  104, CO2 23, BUN 59, creatinine 6.6.  Albumin 2.9.  Urinalysis:  Specific  gravity 1.020, pH 6.5, red blood cells 5-7, white blood cells 2-3, 3+  protein.   ASSESSMENT:  1. 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  2. Acute renal failure secondary to hemodynamic changes due to ACE inhibitor     and congestive heart failure, rule out acute glomerular nephritis.  3. Volume overload resulting in congestive  heart failure.  4. Diabetes mellitus with complications.  5. History of elevated PSA (9.36 on April 01, 2004).  6. Hypertension.  7. Obstructive sleep apnea __________  .  8. Questionable left adrenal nodule.  9. Hyperlipidemia.  10.      History of microhematuria in the past.   RECOMMENDATIONS:  1. Treat congestive heart failure to optimize hemodynamics (renal blood     flow).  2. Full renal workup has been ordered including studies to rule out acute     glomerular nephritis.  3. If PSA and microhematuria persist, we will need to ask urology to     evaluate this gentleman.   I agree with your diuretic management.                                               Darrold Span. Florene Glen, M.D.    ACP/MEDQ  D:  07/09/2004  T:  07/09/2004  Job:  [id number]

## 2011-05-15 NOTE — Op Note (Signed)
NAMECHRISTION, [name]               ACCOUNT NO.:  [id number]   MEDICAL RECORD NO.:  [id number]          PATIENT TYPE:  AMB   LOCATION:  [facility]S                          FACILITY:  [facility]   PHYSICIAN:  John D. Zigmund Daniel, M.D. DATE OF BIRTH:  Aug 19, 1941   DATE OF PROCEDURE:  03/25/2006  DATE OF DISCHARGE:                                 OPERATIVE REPORT   ADMISSION DIAGNOSIS:  Vitreous hemorrhage, proliferative diabetic  retinopathy, left eye.   PROCEDURES:  Pars plana vitrectomy with 25-gauge system, retinal  photocoagulation, left eye; gas-fluid exchange, left eye as well.   SURGEON:  Tempie Hoist, MD   ASSISTANT:  Deatra Ina, MA   ANESTHESIA:  General.   DETAILS:  Usual prep and drape, peritomies at 10, 2 and 4 o'clock.  The 25-  gauge trocars were placed at the 10, 2 and 4 o'clock.  The infusion was  placed at 4 o'clock, Provisc placed on the corneal surface.  A 25-gauge  vitrectomy was performed just behind the crystalline lens.  Clots of blood  were encountered on the disk and in the vitreous cavity.  Scleral depression  was used to remove blood clots from the vitreous base for 360 degrees.  An  extensive washout period was performed to remove all particles of blood from  the vitreous cavity.  An area of neovascular sedation was seen in the upper  nasal quadrant; this was treated with the endolaser.  The endolaser was  positioned in the eye; 650 burns were placed around the retinal periphery  with a power of 1000 milliwatts, 1000 microns each and 0.1 seconds each.  A  30% gas-fluid exchange was performed.  The instruments and trocars removed  from the eye.  The wounds were tested and found to be tight.  Polymyxin and  gentamicin were irrigated into Tenon's space.  Marcaine was injected around  the globe for postop pain.  Decadron 10 mg was injected into the lower  subconjunctival space.  TobraDex ophthalmic ointment, a patch and shield  were placed.  Closing pressure was 10 with  a Barraquer keratometer.  Complications -- none.  Duration - 1/2 hour.  The patient is awakened and  taken to Recovery in satisfactory condition.      [name]. Zigmund Daniel, M.D.  Electronically Signed     JDM/MEDQ  D:  03/25/2006  T:  03/27/2006  Job:  [id number]

## 2011-05-20 LAB — PROTIME-INR: INR: 1.4 — AB (ref <=1.1)

## 2011-05-22 ENCOUNTER — Encounter: Payer: Self-pay | Admitting: Cardiology

## 2011-05-22 ENCOUNTER — Ambulatory Visit (INDEPENDENT_AMBULATORY_CARE_PROVIDER_SITE_OTHER): Payer: Self-pay | Admitting: Cardiology

## 2011-05-22 DIAGNOSIS — R0989 Other specified symptoms and signs involving the circulatory and respiratory systems: Secondary | ICD-10-CM

## 2011-05-28 ENCOUNTER — Ambulatory Visit (INDEPENDENT_AMBULATORY_CARE_PROVIDER_SITE_OTHER): Payer: Self-pay | Admitting: Cardiovascular Disease

## 2011-05-28 DIAGNOSIS — R0989 Other specified symptoms and signs involving the circulatory and respiratory systems: Secondary | ICD-10-CM

## 2011-06-03 ENCOUNTER — Inpatient Hospital Stay (HOSPITAL_COMMUNITY)
Admission: EM | Admit: 2011-06-03 | Discharge: 2011-06-07 | DRG: 377 | Disposition: A | Discharge status: Home / Self Care | Payer: Medicare Other | Attending: Internal Medicine | Admitting: Internal Medicine

## 2011-06-03 DIAGNOSIS — M109 Gout, unspecified: Secondary | ICD-10-CM | POA: Diagnosis present

## 2011-06-03 DIAGNOSIS — I120 Hypertensive chronic kidney disease with stage 5 chronic kidney disease or end stage renal disease: Secondary | ICD-10-CM | POA: Diagnosis present

## 2011-06-03 DIAGNOSIS — D72829 Elevated white blood cell count, unspecified: Secondary | ICD-10-CM | POA: Diagnosis not present

## 2011-06-03 DIAGNOSIS — K648 Other hemorrhoids: Secondary | ICD-10-CM | POA: Diagnosis present

## 2011-06-03 DIAGNOSIS — G4733 Obstructive sleep apnea (adult) (pediatric): Secondary | ICD-10-CM | POA: Diagnosis present

## 2011-06-03 DIAGNOSIS — N2581 Secondary hyperparathyroidism of renal origin: Secondary | ICD-10-CM | POA: Diagnosis present

## 2011-06-03 DIAGNOSIS — K625 Hemorrhage of anus and rectum: Principal | ICD-10-CM | POA: Diagnosis present

## 2011-06-03 DIAGNOSIS — E119 Type 2 diabetes mellitus without complications: Secondary | ICD-10-CM | POA: Diagnosis present

## 2011-06-03 DIAGNOSIS — Z87891 Personal history of nicotine dependence: Secondary | ICD-10-CM

## 2011-06-03 DIAGNOSIS — N186 End stage renal disease: Secondary | ICD-10-CM | POA: Diagnosis present

## 2011-06-03 DIAGNOSIS — D62 Acute posthemorrhagic anemia: Secondary | ICD-10-CM | POA: Diagnosis present

## 2011-06-03 DIAGNOSIS — I509 Heart failure, unspecified: Secondary | ICD-10-CM | POA: Diagnosis present

## 2011-06-03 DIAGNOSIS — I4891 Unspecified atrial fibrillation: Secondary | ICD-10-CM | POA: Diagnosis present

## 2011-06-03 DIAGNOSIS — I5032 Chronic diastolic (congestive) heart failure: Secondary | ICD-10-CM | POA: Diagnosis present

## 2011-06-03 DIAGNOSIS — K5732 Diverticulitis of large intestine without perforation or abscess without bleeding: Secondary | ICD-10-CM | POA: Diagnosis present

## 2011-06-03 DIAGNOSIS — Z7901 Long term (current) use of anticoagulants: Secondary | ICD-10-CM

## 2011-06-03 DIAGNOSIS — Z992 Dependence on renal dialysis: Secondary | ICD-10-CM

## 2011-06-03 DIAGNOSIS — Z79899 Other long term (current) drug therapy: Secondary | ICD-10-CM

## 2011-06-03 LAB — DIFFERENTIAL
Basophils Absolute: 0 10*3/uL (ref 0.0–0.1)
Basophils Relative: 0 % (ref 0–1)
Eosinophils Absolute: 0.2 10*3/uL (ref 0.0–0.7)
Eosinophils Relative: 2 % (ref 0–5)
Lymphocytes Relative: 13 % (ref 12–46)
Monocytes Absolute: 0.8 10*3/uL (ref 0.1–1.0)
Monocytes Relative: 10 % (ref 3–12)
Neutro Abs: 6.1 10*3/uL (ref 1.7–7.7)

## 2011-06-03 LAB — COMPREHENSIVE METABOLIC PANEL
ALT: 20 U/L (ref 0–53)
AST: 15 U/L (ref 0–37)
Albumin: 3.5 g/dL (ref 3.5–5.2)
Alkaline Phosphatase: 89 U/L (ref 39–117)
BUN: 28 mg/dL — ABNORMAL HIGH (ref 6–23)
CO2: 35 mEq/L — ABNORMAL HIGH (ref 19–32)
Chloride: 98 mEq/L (ref 96–112)
GFR calc Af Amer: 15 mL/min — ABNORMAL LOW (ref >60)
Potassium: 3.3 mEq/L — ABNORMAL LOW (ref 3.5–5.1)
Sodium: 143 mEq/L (ref 135–145)
Total Bilirubin: 0.1 mg/dL — ABNORMAL LOW (ref 0.3–1.2)

## 2011-06-03 LAB — CBC
Hemoglobin: 7.3 g/dL — ABNORMAL LOW (ref 13.0–17.0)
MCH: 32 pg (ref 26.0–34.0)
MCHC: 33.5 g/dL (ref 30.0–36.0)
MCV: 95.6 fL (ref 78.0–100.0)
RDW: 14.7 % (ref 11.5–15.5)

## 2011-06-03 LAB — PROTIME-INR: INR: 6.1 — AB (ref <=1.1)

## 2011-06-04 LAB — HEMOGLOBIN AND HEMATOCRIT, BLOOD
HCT: 20 % — ABNORMAL LOW (ref 39.0–52.0)
Hemoglobin: 6.9 g/dL — CL (ref 13.0–17.0)

## 2011-06-04 LAB — COMPREHENSIVE METABOLIC PANEL
ALT: 20 U/L (ref 0–53)
Alkaline Phosphatase: 71 U/L (ref 39–117)
BUN: 36 mg/dL — ABNORMAL HIGH (ref 6–23)
Creatinine, Ser: 5.95 mg/dL — ABNORMAL HIGH (ref 0.4–1.5)
GFR calc non Af Amer: 9 mL/min — ABNORMAL LOW (ref >60)
Glucose, Bld: 94 mg/dL (ref 70–99)
Potassium: 3.6 mEq/L (ref 3.5–5.1)
Sodium: 143 mEq/L (ref 135–145)
Total Protein: 6.3 g/dL (ref 6.0–8.3)

## 2011-06-04 LAB — GLUCOSE, CAPILLARY
Glucose-Capillary: 94 mg/dL (ref 70–99)
Glucose-Capillary: 93 mg/dL (ref 70–99)

## 2011-06-04 LAB — CBC
HCT: 21.7 % — ABNORMAL LOW (ref 39.0–52.0)
MCH: 31.9 pg (ref 26.0–34.0)
MCHC: 34.1 g/dL (ref 30.0–36.0)
MCV: 93.5 fL (ref 78.0–100.0)
RDW: 15.1 % (ref 11.5–15.5)
WBC: 9.5 10*3/uL (ref 4.0–10.5)

## 2011-06-04 LAB — BASIC METABOLIC PANEL
BUN: 41 mg/dL — ABNORMAL HIGH (ref 6–23)
CO2: 30 mEq/L (ref 19–32)
Calcium: 8.1 mg/dL — ABNORMAL LOW (ref 8.4–10.5)
Chloride: 98 mEq/L (ref 96–112)
Creatinine, Ser: 6.7 mg/dL — ABNORMAL HIGH (ref 0.4–1.5)
GFR calc non Af Amer: 8 mL/min — ABNORMAL LOW (ref >60)
Glucose, Bld: 98 mg/dL (ref 70–99)
Sodium: 138 mEq/L (ref 135–145)

## 2011-06-04 LAB — MAGNESIUM: Magnesium: 2.2 mg/dL (ref 1.5–2.5)

## 2011-06-04 LAB — PROTIME-INR: Prothrombin Time: 20.5 seconds — ABNORMAL HIGH (ref 11.6–15.2)

## 2011-06-04 LAB — HEMOGLOBIN A1C: Hgb A1c MFr Bld: 5.4 % (ref <5.7)

## 2011-06-05 ENCOUNTER — Inpatient Hospital Stay (HOSPITAL_COMMUNITY): Payer: Medicare Other

## 2011-06-05 ENCOUNTER — Ambulatory Visit (INDEPENDENT_AMBULATORY_CARE_PROVIDER_SITE_OTHER): Payer: Self-pay | Admitting: Cardiology

## 2011-06-05 DIAGNOSIS — R0989 Other specified symptoms and signs involving the circulatory and respiratory systems: Secondary | ICD-10-CM

## 2011-06-05 LAB — CBC
HCT: 22.7 % — ABNORMAL LOW (ref 39.0–52.0)
HCT: 20.5 % — ABNORMAL LOW (ref 39.0–52.0)
Hemoglobin: 7.1 g/dL — ABNORMAL LOW (ref 13.0–17.0)
MCHC: 33.9 g/dL (ref 30.0–36.0)
MCHC: 34.6 g/dL (ref 30.0–36.0)
MCV: 91.5 fL (ref 78.0–100.0)
Platelets: 147 10*3/uL — ABNORMAL LOW (ref 150–400)
RBC: 2.48 MIL/uL — ABNORMAL LOW (ref 4.22–5.81)
RDW: 15.1 % (ref 11.5–15.5)
WBC: 9.5 10*3/uL (ref 4.0–10.5)

## 2011-06-05 LAB — BASIC METABOLIC PANEL
BUN: 45 mg/dL — ABNORMAL HIGH (ref 6–23)
Calcium: 7.9 mg/dL — ABNORMAL LOW (ref 8.4–10.5)
GFR calc non Af Amer: 7 mL/min — ABNORMAL LOW (ref >60)
Glucose, Bld: 118 mg/dL — ABNORMAL HIGH (ref 70–99)

## 2011-06-05 LAB — IRON AND TIBC: Iron: 48 ug/dL (ref 42–135)

## 2011-06-05 LAB — RENAL FUNCTION PANEL
Albumin: 2.9 g/dL — ABNORMAL LOW (ref 3.5–5.2)
BUN: 45 mg/dL — ABNORMAL HIGH (ref 6–23)
Chloride: 96 mEq/L (ref 96–112)
GFR calc Af Amer: 9 mL/min — ABNORMAL LOW (ref >60)
GFR calc non Af Amer: 7 mL/min — ABNORMAL LOW (ref >60)
Glucose, Bld: 114 mg/dL — ABNORMAL HIGH (ref 70–99)
Phosphorus: 3.5 mg/dL (ref 2.3–4.6)
Potassium: 3.4 mEq/L — ABNORMAL LOW (ref 3.5–5.1)
Sodium: 133 mEq/L — ABNORMAL LOW (ref 135–145)

## 2011-06-05 LAB — PREPARE FRESH FROZEN PLASMA: Unit division: 0

## 2011-06-05 LAB — GLUCOSE, CAPILLARY
Glucose-Capillary: 102 mg/dL — ABNORMAL HIGH (ref 70–99)
Glucose-Capillary: 87 mg/dL (ref 70–99)
Glucose-Capillary: 112 mg/dL — ABNORMAL HIGH (ref 70–99)
Glucose-Capillary: 113 mg/dL — ABNORMAL HIGH (ref 70–99)

## 2011-06-05 LAB — FERRITIN: Ferritin: 872 ng/mL — ABNORMAL HIGH (ref 22–322)

## 2011-06-06 LAB — CROSSMATCH
ABO/RH(D): O POS
Antibody Screen: NEGATIVE
Unit division: 0

## 2011-06-06 LAB — RENAL FUNCTION PANEL
CO2: 28 mEq/L (ref 19–32)
Calcium: 8.2 mg/dL — ABNORMAL LOW (ref 8.4–10.5)
Chloride: 98 mEq/L (ref 96–112)
GFR calc Af Amer: 14 mL/min — ABNORMAL LOW (ref >60)
Glucose, Bld: 89 mg/dL (ref 70–99)
Phosphorus: 2.4 mg/dL (ref 2.3–4.6)
Potassium: 3.6 mEq/L (ref 3.5–5.1)
Sodium: 137 mEq/L (ref 135–145)

## 2011-06-06 LAB — CBC
HCT: 31 % — ABNORMAL LOW (ref 39.0–52.0)
Hemoglobin: 10.6 g/dL — ABNORMAL LOW (ref 13.0–17.0)
MCV: 89.6 fL (ref 78.0–100.0)
RDW: 15.2 % (ref 11.5–15.5)
WBC: 11.9 10*3/uL — ABNORMAL HIGH (ref 4.0–10.5)

## 2011-06-06 LAB — GLUCOSE, CAPILLARY
Glucose-Capillary: 102 mg/dL — ABNORMAL HIGH (ref 70–99)
Glucose-Capillary: 166 mg/dL — ABNORMAL HIGH (ref 70–99)
Glucose-Capillary: 95 mg/dL (ref 70–99)
Glucose-Capillary: 133 mg/dL — ABNORMAL HIGH (ref 70–99)

## 2011-06-07 LAB — CBC
HCT: 31.7 % — ABNORMAL LOW (ref 39.0–52.0)
Hemoglobin: 10.8 g/dL — ABNORMAL LOW (ref 13.0–17.0)
MCH: 30.6 pg (ref 26.0–34.0)
MCV: 89.8 fL (ref 78.0–100.0)
RBC: 3.53 MIL/uL — ABNORMAL LOW (ref 4.22–5.81)
WBC: 13.4 10*3/uL — ABNORMAL HIGH (ref 4.0–10.5)

## 2011-06-07 LAB — GLUCOSE, CAPILLARY: Glucose-Capillary: 85 mg/dL (ref 70–99)

## 2011-06-10 ENCOUNTER — Encounter: Payer: Self-pay | Admitting: Cardiology

## 2011-06-10 ENCOUNTER — Telehealth: Payer: Self-pay | Admitting: *Deleted

## 2011-06-10 LAB — PROTIME-INR

## 2011-06-10 NOTE — Telephone Encounter (Signed)
Pt given results of PFT done 05/14/11 . Dr Aundra Dubin reviewed --mild restrictive lung disease. Follow-up yearly PFTs. I discussed results with pt by telephone. He is aware he needs yearly PFTs. PFT report signed by Dr Aundra Dubin to HIM.

## 2011-06-13 NOTE — Discharge Summary (Signed)
[name], GLASSCO NO.:  [id number]  MEDICAL RECORD NO.:  [id number]  LOCATION:  2035                         FACILITY:  [facility]  PHYSICIAN:  Kathie Dike, MD     DATE OF BIRTH:  1940-12-30  DATE OF ADMISSION:  06/03/2011 DATE OF DISCHARGE:  06/07/2011                        DISCHARGE SUMMARY - REFERRING   PRIMARY CARE PHYSICIAN:  Elayne Snare, MD  NEPHROLOGIST:  Windy Kalata, MD  CARDIOLOGIST:  Loralie Champagne, MD  DISCHARGE DIAGNOSES: 1. Rectal bleeding, no clear source identified, resolved. 2. Acute blood loss anemia, on anemia of chronic disease. 3. End-stage renal disease on hemodialysis. 4. History of paroxysmal atrial fibrillation, previously on Coumadin,     currently in sinus rhythm, on amiodarone. 5. Coagulopathy secondary to Coumadin, reversed. 6. History of diastolic congestive heart failure. 7. Mild leukocytosis.  DISCHARGE MEDICATIONS: 1. Aspirin 81 mg daily. 2. Aranesp 100 mcg intravenous on Friday with hemodialysis. 3. Gabapentin 100 mg by mouth daily at bedtime. 4. Zemplar 4 mcg intravenous Monday, Wednesday, Friday with dialysis. 5. Renal formula vitamin 1 tablet by mouth daily at bedtime. 6. Ambien 5 mg by mouth daily at bedtime. 7. Amiodarone 200 mg by mouth daily. 8. Tylenol 325 mg 2 tablets every 4 hours as needed. 9. Back and body pain relief, over-the-counter 1 tablet by mouth twice     daily as needed. 10.Lipitor 40 mg 1 tablet by mouth daily. 11.PhosLo 667 mg 3 capsules by mouth 3 times a day with meals. 12.Prandin 0.5 mg 1 tablet by mouth twice daily. 13.Toprol-XL 25 mg 1 tablet by mouth daily.  MEDICATIONS STOPPED [place]:  Coumadin.  ADMISSION HISTORY:  This is a 70 year old gentleman with a history of end-stage renal disease on hemodialysis, who presents to the emergency room with rectal bleeding.  The patient has a history of paroxysmal atrial fibrillation and is on Coumadin.  He felt somewhat short  of breath and had some chest pain and he was having bloody bowel movements for approximately a week.  He was subsequently admitted from the emergency room for GI bleeding.  For further details, please refer the history and physical dictated by Dr. Aubery Lapping on June 6.  HOSPITAL COURSE: 1. Rectal bleeding.  The patient was seen in consultation by Dr. Amedeo Plenty     from the [facility].  He underwent a     colonoscopy which showed few diverticula, small internal     hemorrhoids, otherwise a normal study.  His Coumadin had been     reversed on admission.  He did require a total of 4 units of packed     red blood cells to be transfused.  He did also receive 1 unit of     FFP.  Despite reversing his INR, the patient continued to have     rectal bleeding, therefore he was taken for colonoscopy.  Again no     significant findings were found on the colonoscopy.  The patient     denies few diverticula, small internal hemorrhoids, it is unclear     this is a source of the significant bleeding but since that time     the patient's rectal bleeding  has resolved.  He has not had any     further bleeding at this time.  His hemoglobin has remained stable.     His Coumadin has been discontinued and he has put on some aspirin     at this time.  We will ask for him to follow up with cardiologist     for further decisions regarding this.  Again if he is having normal     stools, it is felt that stable for discharge. 2. End-stage renal disease on hemodialysis.  He was continued on     dialysis per Nephrology Service. 3. Acute blood loss anemia, on anemia of chronic disease.  The patient     was transfused a total of 4 units of PRBCs during his entire     hospitalization.  His hemoglobin has been stable. 4. Leukocytosis.  The patient was not to have mild leukocytosis of     13.4 on the day of discharge.  He has been afebrile.  He is not     having diarrhea.  He did have one formed stools  morning.  He denies     short of breath, coughing, or has any other signs of infection.  We     will ask that he will follow up with his primary care physician for     repeat CBC to be checked in the next week and this can also to be     drawn at [place]. 5. Paroxysmal atrial fibrillation.  The patient had been on Coumadin     in the past, but this had been held for a suspected GI bleed.  He     was restarted on that and in light of his significant rectal     bleeding that he has had during this admission, it is recommended     that he not restart Coumadin at this time.  The patient is also     currently in sinus rhythm and has maintained this throughout his     entire hospitalization.  We will continue on aspirin right now and     he will follow up with his primary care physician and his     cardiologist to further determine the risks and benefits and need     for long-term anticoagulation.  PROCEDURES:  None.  CONSULTATIONS: 1. [facility] for hemodialysis. 2. [facility] for rectal bleeding.  DIAGNOSTIC IMAGING:  None.  DISCHARGE INSTRUCTIONS:  The patient should continue on a heart-healthy, low-calorie diet.  Conduct his activity as tolerated.  He will follow up with his primary care doctor as previously scheduled in next 2 weeks. He is scheduled to follow up at the [facility] tomorrow to resume dialysis.  He will be contacted by [facility] for followup appointment in next week regarding his Coumadin.  PLAN:  The patient is otherwise stable for discharge.  Plan was discussed with the patient as well as his daughter who are in agreement.  CONDITION AT TIME OF DISCHARGE:  Improved.     Kathie Dike, MD     JM/MEDQ  D:  06/07/2011  T:  06/07/2011  Job:  [id number]  cc:   Elayne Snare, M.D. Windy Kalata, M.D. Loralie Champagne, MD  Electronically Signed by Kathie Dike  on 06/13/2011 04:05:10 PM

## 2011-06-23 ENCOUNTER — Other Ambulatory Visit: Payer: Medicare Other | Admitting: *Deleted

## 2011-06-26 ENCOUNTER — Other Ambulatory Visit (HOSPITAL_COMMUNITY): Payer: Self-pay | Admitting: Nephrology

## 2011-06-26 DIAGNOSIS — T8249XA Other complication of vascular dialysis catheter, initial encounter: Secondary | ICD-10-CM

## 2011-06-27 ENCOUNTER — Other Ambulatory Visit (HOSPITAL_COMMUNITY): Payer: Self-pay | Admitting: Nephrology

## 2011-06-27 ENCOUNTER — Ambulatory Visit (HOSPITAL_COMMUNITY)
Admission: RE | Admit: 2011-06-27 | Discharge: 2011-06-27 | Disposition: A | Discharge status: Home / Self Care | Payer: Medicare Other | Source: Ambulatory Visit | Attending: Nephrology | Admitting: Nephrology

## 2011-06-27 DIAGNOSIS — N186 End stage renal disease: Secondary | ICD-10-CM | POA: Insufficient documentation

## 2011-06-27 DIAGNOSIS — G4733 Obstructive sleep apnea (adult) (pediatric): Secondary | ICD-10-CM | POA: Insufficient documentation

## 2011-06-27 DIAGNOSIS — I120 Hypertensive chronic kidney disease with stage 5 chronic kidney disease or end stage renal disease: Secondary | ICD-10-CM | POA: Insufficient documentation

## 2011-06-27 DIAGNOSIS — E119 Type 2 diabetes mellitus without complications: Secondary | ICD-10-CM | POA: Insufficient documentation

## 2011-06-27 DIAGNOSIS — T82898A Other specified complication of vascular prosthetic devices, implants and grafts, initial encounter: Secondary | ICD-10-CM | POA: Insufficient documentation

## 2011-06-27 DIAGNOSIS — Y832 Surgical operation with anastomosis, bypass or graft as the cause of abnormal reaction of the patient, or of later complication, without mention of misadventure at the time of the procedure: Secondary | ICD-10-CM | POA: Insufficient documentation

## 2011-06-27 DIAGNOSIS — M109 Gout, unspecified: Secondary | ICD-10-CM | POA: Insufficient documentation

## 2011-06-27 DIAGNOSIS — T8249XA Other complication of vascular dialysis catheter, initial encounter: Secondary | ICD-10-CM

## 2011-06-27 DIAGNOSIS — Z01812 Encounter for preprocedural laboratory examination: Secondary | ICD-10-CM | POA: Insufficient documentation

## 2011-06-27 LAB — POCT I-STAT 4, (NA,K, GLUC, HGB,HCT)
Glucose, Bld: 102 mg/dL — ABNORMAL HIGH (ref 70–99)
HCT: 36 % — ABNORMAL LOW (ref 39.0–52.0)
Hemoglobin: 12.2 g/dL — ABNORMAL LOW (ref 13.0–17.0)
Potassium: 4 mEq/L (ref 3.5–5.1)
Sodium: 141 mEq/L (ref 135–145)

## 2011-06-27 MED ORDER — IOHEXOL 300 MG/ML  SOLN
150.0000 mL | Freq: Once | INTRAMUSCULAR | Status: AC | PRN
  Administered 2011-06-27: 80 mL via INTRAVENOUS

## 2011-07-13 NOTE — H&P (Signed)
[name], [name]               ACCOUNT NO.:  [id number]  MEDICAL RECORD NO.:  [id number]  LOCATION:  2035                         FACILITY:  [facility]  PHYSICIAN:  Taylor Levick, DO         DATE OF BIRTH:  04-11-1941  DATE OF ADMISSION:  06/03/2011 DATE OF DISCHARGE:                             HISTORY & PHYSICAL   CHIEF COMPLAINT:  Bloody bowel movements.  HISTORY OF PRESENT ILLNESS:  The patient is a 70 year old male who states that he has been having bloody bowel movements for a week.  He has been feeling weak and today in particular, he has felt somewhat short of breath and he has had a little chest pain.  He denies nausea, vomiting, or hematemesis.  PAST MEDICAL HISTORY:  Significant for obstructive sleep apnea, hypertension, diabetes mellitus, hyperlipidemia, end-stage renal disease, is on dialysis, Mondays, Wednesdays, and Fridays, gout, diastolic heart failure and atrial fibrillation for which he takes Coumadin.  PAST SURGICAL HISTORY:  He has graft placement in his left forearm.  SOCIAL HISTORY:  He quit smoking in 2005.  No alcohol, no recreational drug use.  He lives at home.  FAMILY HISTORY:  Positive for diabetes mellitus and coronary artery disease.  MEDICATIONS:  At home include: 1. Neurontin 100 mg one p.o. daily. 2. Coumadin 7.5 mg on Mondays, Wednesdays, and Fridays, 5 mg daily on     Tuesdays, Thursdays, Saturdays, and Sundays.  He takes some kind of     over-the-counter back body pain relief, but he states that this is     not ibuprofen or Aleve or any of that class of medication. 3. Acetaminophen 325 two p.o. q.4 h. p.r.n. pain. 4. Ambien 5 mg one p.o. at bedtime. 5. Metoprolol 25 mg one p.o. b.i.d. 6. Lipitor 40 mg one p.o. daily. 7. PhosLo 667 mg one p.o. a.c. t.i.d. 8. Amiodarone 200 mg one p.o. daily. 9. Prandin 0.5 mg one p.o. b.i.d. 10.Nephro-Vite one daily.  ALLERGIES:  NKDA.  REVIEW OF SYSTEMS:  CONSTITUTIONAL:  Negative for fever,  negative for chills, positive for weakness and positive for fatigue.  CNS:  No headaches, no seizures, no limb weakness.  ENT:  No nasal congestion, throat pain, or coryza.  CARDIOVASCULAR:  Positive for __________.  He has had little twinges of chest pain, no palpitations, no orthopnea. RESPIRATORY:  Positive for shortness of breath.  Negative for cough. Negative for wheeze.  GASTROINTESTINAL:  No nausea.  No vomiting. Positive for frequent loose bloody bowel movements.  No constipation. GENITOURINARY:  No dysuria, no hematuria, no urinary frequency.  RENAL: No flank pain, no swelling, no pruritus.  SKIN:  No rashes, no sores or lesions.  HEMATOLOGIC:  No easy bruising, no purpura, no clots.  LYMPH: No lymphadenopathy.  No painful lymph nodes or specific lymph swelling. PSYCHIATRIC:  No anxiety, no depression and no insomnia.  PHYSICAL EXAMINATION:  VITAL SIGNS:  The patient is satting 96% on room air, heart rate 72, blood pressure 115/66, temperature 97.8, respirations 10. GENERAL:  The patient is awake, alert and oriented x3 and able to give fair history. EYES:  Pupils equal, round, reactive to light and accommodation. External ocular  movements bilaterally intact.  Sclarea nonicteric, noninjected. MOUTH:  Oral mucosa is moist.  No lesions, no sores.  Pharynx clear.  No erythema.  No exudate. NECK:  Negative for JVD.  Negative for thyromegaly and negative for lymphadenopathy. HEART:  Regular rate and rhythm at 80 beats per minute without murmur, ectopy, or gallops.  No lateral PMI.  No thrills. LUNGS:  Clear to auscultation bilaterally without wheezes, rales or rhonchi.  No increased work of breathing.  No tactile fremitus. ABDOMEN:  Soft, nontender and nondistended.  Positive bowel sounds.  No hepatosplenomegaly.  No hernias palpated. EXTREMITIES:  Negative for cyanosis, clubbing or edema.  The patient has greatly diminished dorsalis pedis and popliteal pulses bilaterally.   No carotid bruits bilaterally. NEUROLOGIC:  Cranial nerves II through XII grossly intact.  Motor and sensory intact.  LABORATORY DATA:  WBC is 8.2, hemoglobin is 7.3, hematocrit 21.8, platelets are 187, neutrophils 74, lymphs 13.  PT is 47.5, INR 5.18. Sodium 143, potassium 3.3, chloride 98, CO2 35, BUN 28, creatinine 4.68, GFR is 15, T-bili 0.1, alk phos 89, AST 15, ALT 20, total protein 7, albumin 3.5, calcium 8.9.  Fecal occult blood is positive.  ASSESSMENT: 1. Acute blood loss anemia.  The patient was symptomatic with symptoms     of weakness, shortness of breath, and some chest pain.  I have     discussed the patient with Dr. Moshe Cipro of [facility] and     we will transfuse him with 1 unit of packed RBCs. 2. Appears to be a lower gastrointestinal bleed.  I will give the     patient IV Protonix as we done actually now if this is upper and     lower yet.  He will be given the clear liquid diet with no reds.     We will consult GI. 3. Coagulopathy.  The patient has been given IV vitamin K in the     emergency department.  We will monitor his PT and INR with a goal     of normalizing it. 4. End-stage renal disease, status post dialysis today.  Again, I have     consulted [facility] on this patient. 5. History of diastolic heart failure. 6. Atrial fibrillation.  PLAN: 1. Vitamin K. 2. Discussed with consult Nephrology. 3. Protonix. 4. Consult GI. 5. Clear liquids, no reds. 6. Telemetry.          ______________________________ Karie Kirks, DO     AS/MEDQ  D:  06/03/2011  T:  06/04/2011  Job:  [id number]  cc:   Elayne Snare, M.D. Windy Kalata, M.D. Bruce Alfonso Patten Olevia Perches, MD, [facility] Jeryl Columbia, M.D.  Electronically Signed by Karie Kirks DO on 07/13/2011 01:37:41 PM

## 2011-07-14 ENCOUNTER — Encounter: Payer: Self-pay | Admitting: Cardiology

## 2011-07-14 ENCOUNTER — Ambulatory Visit (INDEPENDENT_AMBULATORY_CARE_PROVIDER_SITE_OTHER): Payer: Medicare Other | Admitting: Cardiology

## 2011-07-14 VITALS — BP 144/68 | HR 64 | Resp 16 | Ht 66.0 in | Wt 170.0 lb

## 2011-07-14 DIAGNOSIS — I5032 Chronic diastolic (congestive) heart failure: Secondary | ICD-10-CM

## 2011-07-14 DIAGNOSIS — I4891 Unspecified atrial fibrillation: Secondary | ICD-10-CM

## 2011-07-14 DIAGNOSIS — I509 Heart failure, unspecified: Secondary | ICD-10-CM

## 2011-07-14 NOTE — Patient Instructions (Signed)
Stop Coumadin.  Take Aspirin 325mg  daily.  Your physician wants you to follow-up in: 6 months with Dr Aundra Dubin.(January 2013). You will receive a reminder letter in the mail two months in advance. If you don't receive a letter, please call our office to schedule the follow-up appointment.

## 2011-07-15 NOTE — Op Note (Signed)
  [name], [name]               ACCOUNT NO.:  [id number]  MEDICAL RECORD NO.:  [id number]  LOCATION:  2035                         FACILITY:  [facility]  PHYSICIAN:  Jahzeel Poythress C. Amedeo Plenty, M.D.    DATE OF BIRTH:  1941-05-12  DATE OF PROCEDURE:  06/06/2011 DATE OF DISCHARGE:                              OPERATIVE REPORT   INDICATION FOR PROCEDURE:  Hematochezia.  PROCEDURE:  The patient was placed in the left lateral decubitus position and placed on the pulse monitor with continuous low-flow oxygen delivered by nasal cannula.  He was sedated with 62.5 mcg IV fentanyl and 6 mg IV Versed.  Olympus video colonoscope was inserted into the rectum and advanced to the cecum, confirmed by transillumination of McBurney point and visualization of the ileocecal valve and appendiceal orifice.  Prep was excellent.  The cecum, ascending, transverse, descending, and sigmoid colon all appeared normal with no masses or polyps or any other mucosal abnormalities.  There were 2 or 3 small diverticula seen in the sigmoid colon.  The rectum appeared normal and retroflex view of the anus revealed some small internal hemorrhoids. The scope was then withdrawn and the patient returned to the recovery room in stable condition.  He tolerated the procedure well.  There were no immediate complications.  IMPRESSION:  Few diverticula and small internal hemorrhoids, otherwise normal study.  PLAN:  We will monitor stools and hemoglobin for further bleeding.          ______________________________ [name]. Amedeo Plenty, M.D.     JCH/MEDQ  D:  06/06/2011  T:  06/06/2011  Job:  [id number]  Electronically Signed by Teena Irani M.D. on 07/15/2011 06:59:53 PM

## 2011-07-15 NOTE — Consult Note (Signed)
  [name], [name]               ACCOUNT NO.:  [id number]  MEDICAL RECORD NO.:  [id number]  LOCATION:  2035                         FACILITY:  [facility]  PHYSICIAN:  Rudie Sermons C. Amedeo Plenty, M.D.    DATE OF BIRTH:  07-18-1941  DATE OF CONSULTATION:  06/04/2011 DATE OF DISCHARGE:                                CONSULTATION   REASON FOR CONSULTATION:  Rectal bleeding.  HISTORY OF PRESENT ILLNESS:  The patient is a 70 year old black male on Coumadin for atrial fibrillation, who also has end-stage renal disease who developed intermittent bright red blood per rectum over the last week.  This was noticed at dialysis and he was sent over for admission. His hemoglobin was 11.3, but fell to 7.3 after hydration.  He had an INR 1.74.  His BUN was 36 and creatinine 5.95.  He had a colonoscopy and EGD by Dr. Watt Climes, 2 years ago which showed diffuse small polyps and rare diverticula with nondiagnostic EGD.  He denies any abdominal pain.  PAST MEDICAL HISTORY:  Chronic atrial fibrillation, end-stage renal disease, hypertension, diabetes, gout, and congestive heart failure.  SOCIAL HISTORY:  The patient quit smoking in 2005, does not drink, lives alone.  FAMILY HISTORY:  Negative for GI malignancy.  ALLERGIES:  None.  PHYSICAL EXAMINATION:  GENERAL:  Obese black male in no acute stress. HEART:  Regular rate and rhythm without murmurs. LUNGS:  Clear. ABDOMEN:  Soft, nondistended with normoactive bowel sounds.  No hepatosplenomegaly, mass, or guarding.  IMPRESSION:  Rectal bleeding sounds like lower source in a patient on Coumadin.  PLAN:  We will monitor stools and hemoglobin, probably pursue colonoscopy while in hospital.          ______________________________ [name]. Amedeo Plenty, M.D.     JCH/MEDQ  D:  06/04/2011  T:  06/05/2011  Job:  [id number]  Electronically Signed by Teena Irani M.D. on 07/15/2011 06:58:31 PM

## 2011-07-15 NOTE — Progress Notes (Signed)
PCP: Dr. Dwyane Dee  70 yo with history of paroxysmal atrial fibrillation, diastolic CHF, ESRD presents for cardiology followup.  Patient had been on coumadin for atrial fibrillation.  He had rectal bleeding in 6/12 and was admitted.  He ultimately required 4 units PRBCs and 1 unit FFP.  Colonoscopy did not show definite source, perhaps it was diverticular bleeding.  No recurrence of the rectal bleeding recently.  He is now off coumadin and taking ASA.  He is otherwise doing well with no exertional dyspnea or chest pain.  He is in sinus rhythm today.  Occasional fall in blood pressure at hemodialysis.   ECG: NSR, old ASMI  Labs (3/12): HCT 34.7, LDL 113, HDL   PMH:  1. Atrial fibrillation: paroxysmal, off warfarin with lower GI bleed.  2. Early dementia (?) 3. ESRD 4. OSA 5. Diastolic CHF: Echo (A999333) with EF 0000000, grade I diastolic dysfunction, very mild AS, PA systolic pressure 38 mmHg.  6. HTN 7. DM 8. Hyperlipidemia 9. H/o upper GI bleed 10. Gout 11. Carotid dopplers (2/12) with no significant disease. 12.  Lexiscan myoview (3/12) with EF 66%, no ischemia or infarction.  13.  H/o lower GI bleed (6/12): ? Diverticular bleeding 14.  PFTs (5/12): Mild restrictive defect.   SH: Quit smoking in 2005.  No ETOH.  Lives by himself (divorced) in [place].  Daughter and son live nearby.   FH: Noncontributory.   Current Outpatient Prescriptions  Medication Sig Dispense Refill  . acetaminophen (TYLENOL) 325 MG tablet Take 650 mg by mouth every 6 (six) hours as needed.        Marland Kitchen amiodarone (PACERONE) 200 MG tablet Take 200 mg by mouth daily.        Marland Kitchen atorvastatin (LIPITOR) 40 MG tablet Take 1 tablet (40 mg total) by mouth daily.  30 tablet  3  . B Complex-C-Folic Acid (NEPHRO-VITE RX PO) Take by mouth daily.        . calcium acetate (PHOSLO) 667 MG capsule Take 1 tablet by mouth. 3 TIMES DAILY       . DARBEPOETIN ALFA-POLYSORBATE IJ Inject as directed.        . gabapentin (NEURONTIN) 100 MG  tablet Take 100 mg by mouth daily.        . metoprolol succinate (TOPROL-XL) 25 MG 24 hr tablet Take 25 mg by mouth daily.        . paricalcitol (ZEMPLAR) 4 MCG capsule Take 4 mg by mouth as directed.        . repaglinide (PRANDIN) 1 MG tablet Take 1 mg by mouth 2 (two) times daily before a meal.        . zolpidem (AMBIEN) 5 MG tablet Take 5 mg by mouth at bedtime as needed.        Marland Kitchen aspirin (BAYER ASPIRIN) 325 MG tablet Take 1 tablet (325 mg total) by mouth daily.  100 tablet  3    BP 144/68  Pulse 64  Resp 16  Ht 5\' 6"  (1.676 m)  Wt 170 lb (77.111 kg)  BMI 27.44 kg/m2 General: NAD Neck: No JVD, no thyromegaly or thyroid nodule.  Lungs: Clear to auscultation bilaterally with normal respiratory effort. CV: Nondisplaced PMI.  Heart regular S1/S2, no S3/S4, 2/6 SEM.  No peripheral edema.  No carotid bruit.  Normal pedal pulses.  Abdomen: Soft, nontender, no hepatosplenomegaly, no distention.  Neurologic: Alert and oriented x 3, ? Some short term memory deficit.  Psych: Normal affect. Extremities: No clubbing or cyanosis.

## 2011-07-16 NOTE — Assessment & Plan Note (Signed)
Patient appears euvolemic.  He is on HD for fluid removal.

## 2011-07-16 NOTE — Assessment & Plan Note (Signed)
Paroxysmal.  Sinus rhythm on amiodarone.  Recent LFTs and TSH were normal (amiodarone monitoring).  PFTs showed a mild restrictive defect.   I have also asked him to get his eyes checked yearly.  Warfarin was stopped with significant lower GI bleed.  Will leave him off warfarin for now.  Can reassess in the future.  He should take ASA 325 mg daily.

## 2011-08-10 ENCOUNTER — Telehealth: Payer: Self-pay | Admitting: Cardiology

## 2011-08-10 NOTE — Telephone Encounter (Signed)
Per [name] call wanting to know if Dr. Aundra Dubin will sign RX for pt c-Pap machine. Please return call to advise/discuss.

## 2011-08-10 NOTE — Telephone Encounter (Signed)
Attempted to call April from healthcare phone busy.

## 2011-08-10 NOTE — Telephone Encounter (Signed)
See if DOD would sign the order this week.  Otherwise I will sign when I return.

## 2011-08-10 NOTE — Telephone Encounter (Signed)
Incorrect phone number listed.  Correct number is: [phone].  Spoke with Hailey, pt needs rep. Supplies for CPAP.  CPAP originally ordered by Dr Annamaria Boots.  Pt has not seen him recently and no f/u is scheduled.  Made Hailey aware that this will be reviewed with Dr Aundra Dubin when he returns next week.  Faxed ordered from [facility] is on Dr Claris Gladden cart for review and signature.  Will forward to Dr Aundra Dubin and his nurse, Bosie Helper.

## 2011-08-12 NOTE — Telephone Encounter (Signed)
Order signed by Dr. Rayann Heman (DOD) and faxed back to [place]. I called Hailey and made her aware.

## 2011-09-17 LAB — DIFFERENTIAL
Lymphocytes Relative: 10 — ABNORMAL LOW
Lymphs Abs: 1
Neutro Abs: 7.7
Neutrophils Relative %: 80 — ABNORMAL HIGH

## 2011-09-17 LAB — POCT CARDIAC MARKERS
CKMB, poc: 9.7
Myoglobin, poc: >500
Operator id: 146091

## 2011-09-17 LAB — CK TOTAL AND CKMB (NOT AT ARMC)
CK, MB: 5.3 — ABNORMAL HIGH
CK, MB: 7 — ABNORMAL HIGH
CK, MB: 7.6 — ABNORMAL HIGH
Relative Index: 1.4
Relative Index: 1.4
Total CK: 455 — ABNORMAL HIGH
Total CK: 512 — ABNORMAL HIGH
Total CK: 544 — ABNORMAL HIGH

## 2011-09-17 LAB — RENAL FUNCTION PANEL
BUN: 46 — ABNORMAL HIGH
CO2: 27
Chloride: 105
Glucose, Bld: 60 — ABNORMAL LOW
Phosphorus: 5.7 — ABNORMAL HIGH
Potassium: 3.7
Sodium: 143

## 2011-09-17 LAB — IRON AND TIBC
Iron: 21 — ABNORMAL LOW
TIBC: 245
UIBC: 224

## 2011-09-17 LAB — URINALYSIS, ROUTINE W REFLEX MICROSCOPIC
Bilirubin Urine: NEGATIVE
Nitrite: NEGATIVE
Protein, ur: >300 — AB
Specific Gravity, Urine: 1.019
Urobilinogen, UA: 1

## 2011-09-17 LAB — LIPID PANEL
Cholesterol: 199
Total CHOL/HDL Ratio: 4.1
Triglycerides: 73
VLDL: 15

## 2011-09-17 LAB — COMPREHENSIVE METABOLIC PANEL
ALT: 20
CO2: 30
Calcium: 8.1 — ABNORMAL LOW
Creatinine, Ser: 2.98 — ABNORMAL HIGH
GFR calc Af Amer: 26 — ABNORMAL LOW
GFR calc non Af Amer: 21 — ABNORMAL LOW
Glucose, Bld: 106 — ABNORMAL HIGH
Sodium: 143
Total Bilirubin: 1
Total Protein: 5.9 — ABNORMAL LOW

## 2011-09-17 LAB — D-DIMER, QUANTITATIVE: D-Dimer, Quant: 1.23 — ABNORMAL HIGH

## 2011-09-17 LAB — I-STAT 8, (EC8 V) (CONVERTED LAB)
Acid-Base Excess: 4 — ABNORMAL HIGH
Bicarbonate: 27.8 — ABNORMAL HIGH
Glucose, Bld: 104 — ABNORMAL HIGH
TCO2: 29
pCO2, Ven: 37.4 — ABNORMAL LOW
pH, Ven: 7.479 — ABNORMAL HIGH

## 2011-09-17 LAB — URINE MICROSCOPIC-ADD ON

## 2011-09-17 LAB — PTH, INTACT AND CALCIUM
Calcium, Total (PTH): 7.8 — ABNORMAL LOW
PTH: 162 — ABNORMAL HIGH

## 2011-09-17 LAB — CBC
Platelets: 277
WBC: 9.6

## 2011-09-17 LAB — BASIC METABOLIC PANEL
BUN: 42 — ABNORMAL HIGH
CO2: 29
Calcium: 8.1 — ABNORMAL LOW
Chloride: 106
Creatinine, Ser: 3.67 — ABNORMAL HIGH
GFR calc Af Amer: 20 — ABNORMAL LOW
Glucose, Bld: 87

## 2011-09-17 LAB — FERRITIN: Ferritin: 99 (ref 22–322)

## 2011-09-17 LAB — PROTIME-INR
INR: 1.3
Prothrombin Time: 16.5 — ABNORMAL HIGH

## 2011-09-17 LAB — APTT: aPTT: 53 — ABNORMAL HIGH

## 2011-09-17 LAB — HEMOGLOBIN A1C: Hgb A1c MFr Bld: 5.9

## 2011-09-17 LAB — RETICULOCYTES
RBC.: 3.41 — ABNORMAL LOW
Retic Ct Pct: 1.8

## 2011-09-17 LAB — B-NATRIURETIC PEPTIDE (CONVERTED LAB): Pro B Natriuretic peptide (BNP): 1047 — ABNORMAL HIGH

## 2011-09-18 LAB — BASIC METABOLIC PANEL
BUN: 61 — ABNORMAL HIGH
BUN: 63 — ABNORMAL HIGH
CO2: 27
CO2: 28
CO2: 28
Calcium: 8 — ABNORMAL LOW
Calcium: 8.2 — ABNORMAL LOW
Chloride: 104
Chloride: 106
Creatinine, Ser: 4.09 — ABNORMAL HIGH
GFR calc Af Amer: 18 — ABNORMAL LOW
GFR calc Af Amer: 20 — ABNORMAL LOW
GFR calc non Af Amer: 15 — ABNORMAL LOW
Glucose, Bld: 78
Glucose, Bld: 83
Glucose, Bld: 99
Potassium: 3.7
Potassium: 4.3
Sodium: 141
Sodium: 141
Sodium: 142

## 2011-09-18 LAB — RENAL FUNCTION PANEL
Albumin: 2 — ABNORMAL LOW
CO2: 27
Calcium: 8.2 — ABNORMAL LOW
Creatinine, Ser: 4.02 — ABNORMAL HIGH
Glucose, Bld: 78
Phosphorus: 5.7 — ABNORMAL HIGH
Potassium: 4.1
Sodium: 143

## 2011-09-18 LAB — CBC
MCHC: 33.7
Platelets: 286
RBC: 3.16 — ABNORMAL LOW
RDW: 14.7
WBC: 8.4

## 2011-09-22 LAB — POCT I-STAT 4, (NA,K, GLUC, HGB,HCT)
Potassium: 3.4 — ABNORMAL LOW
Sodium: 141

## 2011-09-28 LAB — BLOOD GAS, ARTERIAL
Acid-Base Excess: 0.1
Bicarbonate: 23.6
Drawn by: 22251
FIO2: 60
O2 Saturation: 99.5
PEEP: 8
Patient temperature: 98.6
pCO2 arterial: 34 — ABNORMAL LOW
pO2, Arterial: 172 — ABNORMAL HIGH

## 2011-09-28 LAB — RENAL FUNCTION PANEL
Albumin: 2.5 — ABNORMAL LOW
Albumin: 1.9 — ABNORMAL LOW
Albumin: 1.9 — ABNORMAL LOW
Albumin: 2 — ABNORMAL LOW
Albumin: 2.1 — ABNORMAL LOW
Albumin: 2.2 — ABNORMAL LOW
Albumin: 2.3 — ABNORMAL LOW
Albumin: 2.3 — ABNORMAL LOW
Albumin: 2.4 — ABNORMAL LOW
Albumin: 2.6 — ABNORMAL LOW
BUN: 41 — ABNORMAL HIGH
BUN: 48 — ABNORMAL HIGH
BUN: 20
BUN: 36 — ABNORMAL HIGH
BUN: 59 — ABNORMAL HIGH
BUN: 65 — ABNORMAL HIGH
CO2: 27
CO2: 27
CO2: 28
CO2: 21
CO2: 21
CO2: 24
CO2: 26
CO2: 27
CO2: 30
Calcium: 9
Calcium: 9.1
Calcium: 8.5
Calcium: 8.6
Calcium: 8.6
Calcium: 8.7
Calcium: 8.7
Calcium: 8.7
Chloride: 100
Chloride: 100
Chloride: 102
Chloride: 103
Chloride: 105
Chloride: 106
Chloride: 106
Chloride: 94 — ABNORMAL LOW
Chloride: 97
Creatinine, Ser: 4.85 — ABNORMAL HIGH
Creatinine, Ser: 5.06 — ABNORMAL HIGH
Creatinine, Ser: 3.05 — ABNORMAL HIGH
Creatinine, Ser: 3.35 — ABNORMAL HIGH
Creatinine, Ser: 3.38 — ABNORMAL HIGH
Creatinine, Ser: 4 — ABNORMAL HIGH
Creatinine, Ser: 4.07 — ABNORMAL HIGH
Creatinine, Ser: 4.39 — ABNORMAL HIGH
GFR calc Af Amer: 14 — ABNORMAL LOW
GFR calc Af Amer: 14 — ABNORMAL LOW
GFR calc Af Amer: 15 — ABNORMAL LOW
GFR calc Af Amer: 11 — ABNORMAL LOW
GFR calc Af Amer: 18 — ABNORMAL LOW
GFR calc Af Amer: 18 — ABNORMAL LOW
GFR calc Af Amer: 18 — ABNORMAL LOW
GFR calc Af Amer: 19 — ABNORMAL LOW
GFR calc Af Amer: 19 — ABNORMAL LOW
GFR calc Af Amer: 22 — ABNORMAL LOW
GFR calc Af Amer: 25 — ABNORMAL LOW
GFR calc non Af Amer: 12 — ABNORMAL LOW
GFR calc non Af Amer: 12 — ABNORMAL LOW
GFR calc non Af Amer: 15 — ABNORMAL LOW
GFR calc non Af Amer: 15 — ABNORMAL LOW
GFR calc non Af Amer: 15 — ABNORMAL LOW
GFR calc non Af Amer: 15 — ABNORMAL LOW
GFR calc non Af Amer: 18 — ABNORMAL LOW
GFR calc non Af Amer: 21 — ABNORMAL LOW
GFR calc non Af Amer: 9 — ABNORMAL LOW
Glucose, Bld: 151 — ABNORMAL HIGH
Glucose, Bld: 89
Glucose, Bld: 128 — ABNORMAL HIGH
Glucose, Bld: 130 — ABNORMAL HIGH
Glucose, Bld: 132 — ABNORMAL HIGH
Glucose, Bld: 135 — ABNORMAL HIGH
Glucose, Bld: 144 — ABNORMAL HIGH
Glucose, Bld: 86
Glucose, Bld: 98
Phosphorus: 4.2
Phosphorus: 4.4
Phosphorus: 3
Phosphorus: 3
Phosphorus: 3
Phosphorus: 3
Phosphorus: 3.1
Phosphorus: 3.5
Phosphorus: 3.6
Potassium: 3.8
Potassium: 4.2
Potassium: 4.2
Potassium: 3 — ABNORMAL LOW
Potassium: 3.1 — ABNORMAL LOW
Potassium: 3.1 — ABNORMAL LOW
Potassium: 3.5
Potassium: 3.5
Potassium: 3.5
Potassium: 3.8
Sodium: 139
Sodium: 134 — ABNORMAL LOW
Sodium: 140
Sodium: 140
Sodium: 140
Sodium: 140
Sodium: 141
Sodium: 141
Sodium: 142

## 2011-09-28 LAB — GLUCOSE, CAPILLARY
Glucose-Capillary: 103 — ABNORMAL HIGH
Glucose-Capillary: 104 — ABNORMAL HIGH
Glucose-Capillary: 104 — ABNORMAL HIGH
Glucose-Capillary: 105 — ABNORMAL HIGH
Glucose-Capillary: 106 — ABNORMAL HIGH
Glucose-Capillary: 107 — ABNORMAL HIGH
Glucose-Capillary: 107 — ABNORMAL HIGH
Glucose-Capillary: 108 — ABNORMAL HIGH
Glucose-Capillary: 109 — ABNORMAL HIGH
Glucose-Capillary: 112 — ABNORMAL HIGH
Glucose-Capillary: 114 — ABNORMAL HIGH
Glucose-Capillary: 114 — ABNORMAL HIGH
Glucose-Capillary: 118 — ABNORMAL HIGH
Glucose-Capillary: 120 — ABNORMAL HIGH
Glucose-Capillary: 122 — ABNORMAL HIGH
Glucose-Capillary: 122 — ABNORMAL HIGH
Glucose-Capillary: 122 — ABNORMAL HIGH
Glucose-Capillary: 123 — ABNORMAL HIGH
Glucose-Capillary: 124 — ABNORMAL HIGH
Glucose-Capillary: 125 — ABNORMAL HIGH
Glucose-Capillary: 126 — ABNORMAL HIGH
Glucose-Capillary: 126 — ABNORMAL HIGH
Glucose-Capillary: 128 — ABNORMAL HIGH
Glucose-Capillary: 129 — ABNORMAL HIGH
Glucose-Capillary: 130 — ABNORMAL HIGH
Glucose-Capillary: 132 — ABNORMAL HIGH
Glucose-Capillary: 132 — ABNORMAL HIGH
Glucose-Capillary: 137 — ABNORMAL HIGH
Glucose-Capillary: 138 — ABNORMAL HIGH
Glucose-Capillary: 139 — ABNORMAL HIGH
Glucose-Capillary: 142 — ABNORMAL HIGH
Glucose-Capillary: 147 — ABNORMAL HIGH
Glucose-Capillary: 147 — ABNORMAL HIGH
Glucose-Capillary: 153 — ABNORMAL HIGH
Glucose-Capillary: 153 — ABNORMAL HIGH
Glucose-Capillary: 154 — ABNORMAL HIGH
Glucose-Capillary: 156 — ABNORMAL HIGH
Glucose-Capillary: 161 — ABNORMAL HIGH
Glucose-Capillary: 167 — ABNORMAL HIGH
Glucose-Capillary: 178 — ABNORMAL HIGH
Glucose-Capillary: 180 — ABNORMAL HIGH
Glucose-Capillary: 180 — ABNORMAL HIGH
Glucose-Capillary: 203 — ABNORMAL HIGH
Glucose-Capillary: 72
Glucose-Capillary: 76
Glucose-Capillary: 83
Glucose-Capillary: 90
Glucose-Capillary: 91
Glucose-Capillary: 94
Glucose-Capillary: 95
Glucose-Capillary: 95
Glucose-Capillary: 95
Glucose-Capillary: 96
Glucose-Capillary: 96
Glucose-Capillary: 98
Glucose-Capillary: 99
Glucose-Capillary: 103 — ABNORMAL HIGH
Glucose-Capillary: 109 — ABNORMAL HIGH
Glucose-Capillary: 113 — ABNORMAL HIGH
Glucose-Capillary: 114 — ABNORMAL HIGH
Glucose-Capillary: 122 — ABNORMAL HIGH
Glucose-Capillary: 122 — ABNORMAL HIGH
Glucose-Capillary: 123 — ABNORMAL HIGH
Glucose-Capillary: 125 — ABNORMAL HIGH
Glucose-Capillary: 125 — ABNORMAL HIGH
Glucose-Capillary: 125 — ABNORMAL HIGH
Glucose-Capillary: 133 — ABNORMAL HIGH
Glucose-Capillary: 133 — ABNORMAL HIGH
Glucose-Capillary: 135 — ABNORMAL HIGH
Glucose-Capillary: 137 — ABNORMAL HIGH
Glucose-Capillary: 139 — ABNORMAL HIGH
Glucose-Capillary: 143 — ABNORMAL HIGH
Glucose-Capillary: 144 — ABNORMAL HIGH
Glucose-Capillary: 144 — ABNORMAL HIGH
Glucose-Capillary: 146 — ABNORMAL HIGH
Glucose-Capillary: 150 — ABNORMAL HIGH
Glucose-Capillary: 158 — ABNORMAL HIGH
Glucose-Capillary: 171 — ABNORMAL HIGH
Glucose-Capillary: 172 — ABNORMAL HIGH
Glucose-Capillary: 185 — ABNORMAL HIGH
Glucose-Capillary: 199 — ABNORMAL HIGH
Glucose-Capillary: 224 — ABNORMAL HIGH
Glucose-Capillary: 260 — ABNORMAL HIGH
Glucose-Capillary: 89

## 2011-09-28 LAB — URINE MICROSCOPIC-ADD ON

## 2011-09-28 LAB — BASIC METABOLIC PANEL
BUN: 60 — ABNORMAL HIGH
BUN: 76 — ABNORMAL HIGH
BUN: 78 — ABNORMAL HIGH
BUN: 83 — ABNORMAL HIGH
BUN: 31 — ABNORMAL HIGH
BUN: 46 — ABNORMAL HIGH
CO2: 23
CO2: 24
CO2: 26
CO2: 24
CO2: 29
Calcium: 8.1 — ABNORMAL LOW
Calcium: 8.2 — ABNORMAL LOW
Calcium: 8.4
Calcium: 8.6
Chloride: 101
Chloride: 101
Chloride: 104
Chloride: 104
Chloride: 96
Chloride: 95 — ABNORMAL LOW
Chloride: 95 — ABNORMAL LOW
Creatinine, Ser: 5.71 — ABNORMAL HIGH
Creatinine, Ser: 6.61 — ABNORMAL HIGH
Creatinine, Ser: 8.11 — ABNORMAL HIGH
Creatinine, Ser: 6.42 — ABNORMAL HIGH
GFR calc Af Amer: 10 — ABNORMAL LOW
GFR calc Af Amer: 12 — ABNORMAL LOW
GFR calc Af Amer: 9 — ABNORMAL LOW
GFR calc Af Amer: 11 — ABNORMAL LOW
GFR calc Af Amer: 16 — ABNORMAL LOW
GFR calc non Af Amer: 7 — ABNORMAL LOW
GFR calc non Af Amer: 13 — ABNORMAL LOW
GFR calc non Af Amer: 15 — ABNORMAL LOW
GFR calc non Af Amer: 9 — ABNORMAL LOW
Glucose, Bld: 114 — ABNORMAL HIGH
Glucose, Bld: 141 — ABNORMAL HIGH
Glucose, Bld: 151 — ABNORMAL HIGH
Glucose, Bld: 85
Glucose, Bld: 131 — ABNORMAL HIGH
Potassium: 3 — ABNORMAL LOW
Potassium: 3.5
Potassium: 3.6
Potassium: 3.9
Potassium: 3.5
Potassium: 3.6
Sodium: 136
Sodium: 135
Sodium: 139

## 2011-09-28 LAB — COMPREHENSIVE METABOLIC PANEL
ALT: 28
ALT: 29
ALT: 33
ALT: 33
ALT: 46
AST: 21
AST: 22
Albumin: 2.1 — ABNORMAL LOW
Albumin: 2.2 — ABNORMAL LOW
Albumin: 2.3 — ABNORMAL LOW
Albumin: 2.4 — ABNORMAL LOW
Alkaline Phosphatase: 124 — ABNORMAL HIGH
Alkaline Phosphatase: 126 — ABNORMAL HIGH
Alkaline Phosphatase: 128 — ABNORMAL HIGH
Alkaline Phosphatase: 87
BUN: 39 — ABNORMAL HIGH
BUN: 54 — ABNORMAL HIGH
BUN: 60 — ABNORMAL HIGH
BUN: 78 — ABNORMAL HIGH
CO2: 18 — ABNORMAL LOW
CO2: 21
Calcium: 7.6 — ABNORMAL LOW
Calcium: 7.9 — ABNORMAL LOW
Calcium: 9.1
Calcium: 9.1
Chloride: 100
Chloride: 100
Chloride: 98
Chloride: 98
Creatinine, Ser: 4.74 — ABNORMAL HIGH
Creatinine, Ser: 5.77 — ABNORMAL HIGH
Creatinine, Ser: 7.94 — ABNORMAL HIGH
Creatinine, Ser: 7.94 — ABNORMAL HIGH
GFR calc Af Amer: 12 — ABNORMAL LOW
GFR calc Af Amer: 8 — ABNORMAL LOW
GFR calc non Af Amer: 10 — ABNORMAL LOW
GFR calc non Af Amer: 11 — ABNORMAL LOW
GFR calc non Af Amer: 12 — ABNORMAL LOW
GFR calc non Af Amer: 7 — ABNORMAL LOW
GFR calc non Af Amer: 9 — ABNORMAL LOW
Glucose, Bld: 109 — ABNORMAL HIGH
Glucose, Bld: 130 — ABNORMAL HIGH
Glucose, Bld: 131 — ABNORMAL HIGH
Glucose, Bld: 92
Potassium: 3.4 — ABNORMAL LOW
Potassium: 4
Potassium: 4.2
Potassium: 4.3
Sodium: 135
Sodium: 137
Sodium: 139
Sodium: 139
Total Bilirubin: 0.7
Total Bilirubin: 0.7
Total Bilirubin: 0.8
Total Protein: 5.9 — ABNORMAL LOW
Total Protein: 5.9 — ABNORMAL LOW
Total Protein: 6
Total Protein: 7
Total Protein: 7.5

## 2011-09-28 LAB — URINALYSIS, ROUTINE W REFLEX MICROSCOPIC
Bilirubin Urine: NEGATIVE
Bilirubin Urine: NEGATIVE
Bilirubin Urine: NEGATIVE
Bilirubin Urine: NEGATIVE
Glucose, UA: 100 — AB
Glucose, UA: NEGATIVE
Ketones, ur: NEGATIVE
Ketones, ur: NEGATIVE
Ketones, ur: NEGATIVE
Ketones, ur: NEGATIVE
Nitrite: NEGATIVE
Protein, ur: 100 — AB
Protein, ur: >300 — AB
Protein, ur: 100 — AB
Protein, ur: >300 — AB
Specific Gravity, Urine: 1.019
Specific Gravity, Urine: 1.011
Urobilinogen, UA: 0.2
Urobilinogen, UA: 0.2
Urobilinogen, UA: 1
pH: 6

## 2011-09-28 LAB — CBC
HCT: 23 — ABNORMAL LOW
HCT: 23.8 — ABNORMAL LOW
HCT: 25.6 — ABNORMAL LOW
HCT: 33.3 — ABNORMAL LOW
HCT: 25.3 — ABNORMAL LOW
HCT: 26.9 — ABNORMAL LOW
HCT: 27 — ABNORMAL LOW
HCT: 27.9 — ABNORMAL LOW
HCT: 28.5 — ABNORMAL LOW
HCT: 29.8 — ABNORMAL LOW
HCT: 30.9 — ABNORMAL LOW
HCT: 34.3 — ABNORMAL LOW
HCT: 36.5 — ABNORMAL LOW
HCT: 37.4 — ABNORMAL LOW
Hemoglobin: 10.6 — ABNORMAL LOW
Hemoglobin: 10.7 — ABNORMAL LOW
Hemoglobin: 8.2 — ABNORMAL LOW
Hemoglobin: 8.4 — ABNORMAL LOW
Hemoglobin: 12.3 — ABNORMAL LOW
Hemoglobin: 12.5 — ABNORMAL LOW
Hemoglobin: 12.6 — ABNORMAL LOW
Hemoglobin: 7.5 — CL
Hemoglobin: 7.8 — CL
Hemoglobin: 7.9 — CL
Hemoglobin: 8.4 — ABNORMAL LOW
Hemoglobin: 9 — ABNORMAL LOW
Hemoglobin: 9 — ABNORMAL LOW
Hemoglobin: 9.3 — ABNORMAL LOW
Hemoglobin: 9.6 — ABNORMAL LOW
Hemoglobin: 9.7 — ABNORMAL LOW
MCHC: 32.1
MCHC: 32.1
MCHC: 32.2
MCHC: 32.3
MCHC: 33.2
MCHC: 33.3
MCHC: 33.4
MCHC: 33.6
MCHC: 32.6
MCHC: 32.6
MCHC: 32.8
MCHC: 33.1
MCHC: 33.3
MCHC: 33.4
MCHC: 33.5
MCHC: 33.6
MCHC: 33.7
MCHC: 33.7
MCHC: 33.8
MCHC: 33.8
MCHC: 34.1
MCV: 88
MCV: 88.2
MCV: 88.3
MCV: 88.4
MCV: 88.6
MCV: 84.8
MCV: 85.1
MCV: 85.5
MCV: 85.7
MCV: 85.8
MCV: 85.8
MCV: 85.8
MCV: 86
MCV: 86.1
MCV: 86.3
MCV: 86.6
Platelets: 231
Platelets: 246
Platelets: 281
Platelets: 315
Platelets: 338
Platelets: 371
Platelets: 372
Platelets: 178
Platelets: 187
Platelets: 195
Platelets: 195
Platelets: 200
Platelets: 203
Platelets: 206
Platelets: 208
Platelets: 208
Platelets: 209
Platelets: 213
Platelets: 238
Platelets: 261
RBC: 2.61 — ABNORMAL LOW
RBC: 2.85 — ABNORMAL LOW
RBC: 2.87 — ABNORMAL LOW
RBC: 2.61 — ABNORMAL LOW
RBC: 2.61 — ABNORMAL LOW
RBC: 2.67 — ABNORMAL LOW
RBC: 2.73 — ABNORMAL LOW
RBC: 3.14 — ABNORMAL LOW
RBC: 3.24 — ABNORMAL LOW
RBC: 3.33 — ABNORMAL LOW
RBC: 3.45 — ABNORMAL LOW
RBC: 3.79 — ABNORMAL LOW
RBC: 4.27
RBC: 4.37
RBC: 4.4
RDW: 14.9
RDW: 15.1
RDW: 15.2
RDW: 15.4
RDW: 15.5
RDW: 15.5
RDW: 15.6 — ABNORMAL HIGH
RDW: 15.9 — ABNORMAL HIGH
RDW: 16.2 — ABNORMAL HIGH
RDW: 16.2 — ABNORMAL HIGH
RDW: 16.4 — ABNORMAL HIGH
RDW: 16.5 — ABNORMAL HIGH
RDW: 16.5 — ABNORMAL HIGH
RDW: 16.6 — ABNORMAL HIGH
RDW: 16.9 — ABNORMAL HIGH
RDW: 16.9 — ABNORMAL HIGH
RDW: 17 — ABNORMAL HIGH
RDW: 17 — ABNORMAL HIGH
RDW: 17.1 — ABNORMAL HIGH
WBC: 10.4
WBC: 11.1 — ABNORMAL HIGH
WBC: 11.6 — ABNORMAL HIGH
WBC: 11.8 — ABNORMAL HIGH
WBC: 12.4 — ABNORMAL HIGH
WBC: 15.2 — ABNORMAL HIGH
WBC: 14.1 — ABNORMAL HIGH
WBC: 14.3 — ABNORMAL HIGH
WBC: 14.7 — ABNORMAL HIGH
WBC: 15.3 — ABNORMAL HIGH
WBC: 15.7 — ABNORMAL HIGH
WBC: 16 — ABNORMAL HIGH
WBC: 17.3 — ABNORMAL HIGH
WBC: 19.2 — ABNORMAL HIGH

## 2011-09-28 LAB — URINE CULTURE
Colony Count: NO GROWTH
Culture: NO GROWTH

## 2011-09-28 LAB — POCT I-STAT, CHEM 8
BUN: 61 — ABNORMAL HIGH
Calcium, Ion: 1.16
Chloride: 114 — ABNORMAL HIGH
Creatinine, Ser: 4.3 — ABNORMAL HIGH
Glucose, Bld: 57 — ABNORMAL LOW
Hemoglobin: 10.9 — ABNORMAL LOW
Potassium: 3.7

## 2011-09-28 LAB — CULTURE, ROUTINE-ABSCESS: Culture: NO GROWTH

## 2011-09-28 LAB — CROSSMATCH
ABO/RH(D): O POS
ABO/RH(D): O POS
Antibody Screen: NEGATIVE

## 2011-09-28 LAB — URINALYSIS, MICROSCOPIC ONLY
Bilirubin Urine: NEGATIVE
Glucose, UA: NEGATIVE
Ketones, ur: NEGATIVE
Protein, ur: >300 — AB
Urobilinogen, UA: 0.2

## 2011-09-28 LAB — CK TOTAL AND CKMB (NOT AT ARMC)
CK, MB: 5.3 — ABNORMAL HIGH
CK, MB: 5.6 — ABNORMAL HIGH
CK, MB: 6.5 — ABNORMAL HIGH
Relative Index: 1.4
Relative Index: 2.3
Total CK: 423 — ABNORMAL HIGH
Total CK: 286 — ABNORMAL HIGH

## 2011-09-28 LAB — LEGIONELLA ANTIGEN, URINE: Legionella Antigen, Urine: NEGATIVE

## 2011-09-28 LAB — IRON AND TIBC
Iron: 20 — ABNORMAL LOW
Iron: <10 — ABNORMAL LOW
TIBC: 240
UIBC: 247

## 2011-09-28 LAB — DIFFERENTIAL
Basophils Absolute: 0
Basophils Absolute: 0
Basophils Absolute: 0
Basophils Absolute: 0
Basophils Absolute: 0
Basophils Absolute: 0.2 — ABNORMAL HIGH
Basophils Relative: 0
Basophils Relative: 0
Basophils Relative: 0
Basophils Relative: 1
Basophils Relative: 1
Eosinophils Absolute: 0
Eosinophils Absolute: 0.1
Eosinophils Relative: 0
Eosinophils Relative: 3
Eosinophils Relative: 1
Eosinophils Relative: 2
Eosinophils Relative: 3
Lymphocytes Relative: 13
Lymphocytes Relative: 10 — ABNORMAL LOW
Lymphocytes Relative: 6 — ABNORMAL LOW
Lymphocytes Relative: 6 — ABNORMAL LOW
Lymphocytes Relative: 7 — ABNORMAL LOW
Lymphs Abs: 1.2
Lymphs Abs: 1.2
Monocytes Absolute: 1.2 — ABNORMAL HIGH
Monocytes Absolute: 0.7
Monocytes Absolute: 0.8
Monocytes Absolute: 1
Monocytes Relative: 9
Neutro Abs: 11.9 — ABNORMAL HIGH
Neutro Abs: 12 — ABNORMAL HIGH
Neutro Abs: 16.8 — ABNORMAL HIGH
Neutro Abs: 6.5
Neutrophils Relative %: 72
Neutrophils Relative %: 85 — ABNORMAL HIGH
Neutrophils Relative %: 81 — ABNORMAL HIGH
Neutrophils Relative %: 83 — ABNORMAL HIGH
Neutrophils Relative %: 87 — ABNORMAL HIGH

## 2011-09-28 LAB — CREATININE, URINE, RANDOM
Creatinine, Urine: 83.1
Creatinine, Urine: 103

## 2011-09-28 LAB — POCT CARDIAC MARKERS
CKMB, poc: 7.4
CKMB, poc: 7.5
Myoglobin, poc: 422
Myoglobin, poc: >500
Troponin i, poc: <0.05

## 2011-09-28 LAB — PROTIME-INR
INR: 1.1
INR: 1.4
INR: 1.5
INR: 1.5
INR: 1.6 — ABNORMAL HIGH
INR: 1.7 — ABNORMAL HIGH
Prothrombin Time: 17.2 — ABNORMAL HIGH
Prothrombin Time: 18.3 — ABNORMAL HIGH
Prothrombin Time: 18.5 — ABNORMAL HIGH
Prothrombin Time: 19.8 — ABNORMAL HIGH
Prothrombin Time: 20.5 — ABNORMAL HIGH
Prothrombin Time: 22.2 — ABNORMAL HIGH

## 2011-09-28 LAB — POCT I-STAT 3, ART BLOOD GAS (G3+)
O2 Saturation: 100
TCO2: 27
pCO2 arterial: 36.6
pH, Arterial: 7.457 — ABNORMAL HIGH
pO2, Arterial: 222 — ABNORMAL HIGH

## 2011-09-28 LAB — PTH, INTACT AND CALCIUM
Calcium, Total (PTH): 7.2 — ABNORMAL LOW
Calcium, Total (PTH): 8.3 — ABNORMAL LOW
PTH: 231.7 — ABNORMAL HIGH
PTH: 125.7 — ABNORMAL HIGH

## 2011-09-28 LAB — TROPONIN I: Troponin I: 0.04

## 2011-09-28 LAB — B-NATRIURETIC PEPTIDE (CONVERTED LAB)
Pro B Natriuretic peptide (BNP): 1118 — ABNORMAL HIGH
Pro B Natriuretic peptide (BNP): 590 — ABNORMAL HIGH
Pro B Natriuretic peptide (BNP): 858 — ABNORMAL HIGH

## 2011-09-28 LAB — HEPATITIS B SURFACE ANTIGEN
Hepatitis B Surface Ag: NEGATIVE
Hepatitis B Surface Ag: NEGATIVE

## 2011-09-28 LAB — CULTURE, BLOOD (ROUTINE X 2)
Culture: NO GROWTH
Culture: NO GROWTH

## 2011-09-28 LAB — CARDIAC PANEL(CRET KIN+CKTOT+MB+TROPI)
CK, MB: 1.9
CK, MB: 2.1
CK, MB: 5.1 — ABNORMAL HIGH
CK, MB: 8.5 — ABNORMAL HIGH
Relative Index: 2
Relative Index: 2.7 — ABNORMAL HIGH
Relative Index: INVALID
Relative Index: INVALID
Total CK: 106
Total CK: 191
Total CK: 272 — ABNORMAL HIGH
Total CK: 98
Troponin I: 0.02
Troponin I: 0.02
Troponin I: 0.02
Troponin I: 0.03

## 2011-09-28 LAB — LIPID PANEL
LDL Cholesterol: 91
Triglycerides: 68
VLDL: 14

## 2011-09-28 LAB — PHOSPHORUS
Phosphorus: 3.5
Phosphorus: 4.1
Phosphorus: 4.7 — ABNORMAL HIGH
Phosphorus: 4.9 — ABNORMAL HIGH
Phosphorus: 4.3

## 2011-09-28 LAB — ALBUMIN: Albumin: 2.2 — ABNORMAL LOW

## 2011-09-28 LAB — HEPATITIS B CORE ANTIBODY, TOTAL: Hep B Core Total Ab: NEGATIVE

## 2011-09-28 LAB — HEMOGLOBIN A1C
Hgb A1c MFr Bld: 5.1
Mean Plasma Glucose: 100

## 2011-09-28 LAB — HEPARIN LEVEL (UNFRACTIONATED)
Heparin Unfractionated: 0.16 — ABNORMAL LOW
Heparin Unfractionated: 0.22 — ABNORMAL LOW
Heparin Unfractionated: 0.86 — ABNORMAL HIGH
Heparin Unfractionated: <0.1 — ABNORMAL LOW

## 2011-09-28 LAB — LACTIC ACID, PLASMA: Lactic Acid, Venous: 2.1

## 2011-09-28 LAB — HIV ANTIBODY (ROUTINE TESTING W REFLEX): HIV: NONREACTIVE

## 2011-09-28 LAB — MAGNESIUM
Magnesium: 2.1
Magnesium: 2.2
Magnesium: 2.2

## 2011-09-28 LAB — D-DIMER, QUANTITATIVE: D-Dimer, Quant: 3.01 — ABNORMAL HIGH

## 2011-09-28 LAB — HEPATITIS B SURFACE ANTIBODY,QUALITATIVE: Hep B S Ab: NEGATIVE

## 2011-09-28 LAB — OCCULT BLOOD X 1 CARD TO LAB, STOOL
Fecal Occult Bld: NEGATIVE
Fecal Occult Bld: POSITIVE

## 2011-09-28 LAB — SODIUM, URINE, RANDOM: Sodium, Ur: 56

## 2011-09-28 LAB — RETICULOCYTES
RBC.: 3.09 — ABNORMAL LOW
Retic Count, Absolute: 27.8

## 2011-09-28 LAB — FOLATE: Folate: >20

## 2011-09-28 LAB — FERRITIN: Ferritin: 145 (ref 22–322)

## 2011-09-28 LAB — APTT: aPTT: 45 — ABNORMAL HIGH

## 2011-09-28 LAB — D-DIMER, QUANTITATIVE (NOT AT ARMC): D-Dimer, Quant: >20 — ABNORMAL HIGH

## 2011-09-28 LAB — TSH
TSH: 0.858
TSH: 0.74

## 2011-09-28 LAB — ALT: ALT: 25

## 2011-09-29 LAB — BASIC METABOLIC PANEL
BUN: 34 — ABNORMAL HIGH
BUN: 66 — ABNORMAL HIGH
CO2: 25
CO2: 27
CO2: 30
CO2: 30
Calcium: 9
Chloride: 93 — ABNORMAL LOW
Chloride: 96
Creatinine, Ser: 5.15 — ABNORMAL HIGH
Creatinine, Ser: 5.47 — ABNORMAL HIGH
Creatinine, Ser: 6.7 — ABNORMAL HIGH
GFR calc Af Amer: 10 — ABNORMAL LOW
GFR calc Af Amer: 13 — ABNORMAL LOW
GFR calc Af Amer: 8 — ABNORMAL LOW
GFR calc non Af Amer: 10 — ABNORMAL LOW
GFR calc non Af Amer: 11 — ABNORMAL LOW
GFR calc non Af Amer: 7 — ABNORMAL LOW
Glucose, Bld: 111 — ABNORMAL HIGH
Glucose, Bld: 118 — ABNORMAL HIGH
Glucose, Bld: 119 — ABNORMAL HIGH
Glucose, Bld: 97
Potassium: 4
Sodium: 131 — ABNORMAL LOW
Sodium: 133 — ABNORMAL LOW

## 2011-09-29 LAB — CBC
HCT: 32.3 — ABNORMAL LOW
HCT: 34.4 — ABNORMAL LOW
HCT: 35 — ABNORMAL LOW
HCT: 39
Hemoglobin: 10.9 — ABNORMAL LOW
Hemoglobin: 11.5 — ABNORMAL LOW
Hemoglobin: 11.7 — ABNORMAL LOW
Hemoglobin: 11.8 — ABNORMAL LOW
Hemoglobin: 11.9 — ABNORMAL LOW
MCHC: 32.8
MCHC: 33.7
MCHC: 33.8
MCHC: 33.8
MCHC: 33.8
MCV: 84.1
MCV: 84.3
MCV: 84.6
MCV: 86.2
Platelets: 268
Platelets: 304
Platelets: 315
RBC: 4.09 — ABNORMAL LOW
RBC: 4.1 — ABNORMAL LOW
RBC: 4.12 — ABNORMAL LOW
RBC: 4.17 — ABNORMAL LOW
RBC: 4.24
RDW: 15.8 — ABNORMAL HIGH
RDW: 15.9 — ABNORMAL HIGH
RDW: 15.9 — ABNORMAL HIGH
RDW: 16.2 — ABNORMAL HIGH
RDW: 16.3 — ABNORMAL HIGH
WBC: 10.1
WBC: 10.5
WBC: 10.6 — ABNORMAL HIGH
WBC: 12.2 — ABNORMAL HIGH

## 2011-09-29 LAB — PROTIME-INR
INR: 2 — ABNORMAL HIGH
INR: 2 — ABNORMAL HIGH
INR: 2.1 — ABNORMAL HIGH
INR: 2.3 — ABNORMAL HIGH
INR: 2.3 — ABNORMAL HIGH
Prothrombin Time: 23.5 — ABNORMAL HIGH
Prothrombin Time: 24.3 — ABNORMAL HIGH
Prothrombin Time: 25.2 — ABNORMAL HIGH
Prothrombin Time: 26.6 — ABNORMAL HIGH

## 2011-09-29 LAB — GLUCOSE, CAPILLARY
Glucose-Capillary: 104 — ABNORMAL HIGH
Glucose-Capillary: 108 — ABNORMAL HIGH
Glucose-Capillary: 140 — ABNORMAL HIGH
Glucose-Capillary: 148 — ABNORMAL HIGH
Glucose-Capillary: 156 — ABNORMAL HIGH
Glucose-Capillary: 207 — ABNORMAL HIGH

## 2011-09-29 LAB — RENAL FUNCTION PANEL
Albumin: 2.4 — ABNORMAL LOW
BUN: 45 — ABNORMAL HIGH
BUN: 45 — ABNORMAL HIGH
BUN: 68 — ABNORMAL HIGH
CO2: 23
CO2: 28
CO2: 29
Calcium: 8.6
Chloride: 95 — ABNORMAL LOW
Chloride: 95 — ABNORMAL LOW
Creatinine, Ser: 6.68 — ABNORMAL HIGH
Creatinine, Ser: 7.53 — ABNORMAL HIGH
Glucose, Bld: 112 — ABNORMAL HIGH
Glucose, Bld: 125 — ABNORMAL HIGH
Glucose, Bld: 129 — ABNORMAL HIGH
Phosphorus: 5.2 — ABNORMAL HIGH
Potassium: 3.6
Potassium: 3.6
Potassium: 4

## 2011-09-30 LAB — CBC
HCT: 27.5 — ABNORMAL LOW
HCT: 29.8 — ABNORMAL LOW
Hemoglobin: 9 — ABNORMAL LOW
MCHC: 32.3
MCHC: 32.5
MCV: 89.4
MCV: 90.5
Platelets: 218
RBC: 3.04 — ABNORMAL LOW
RBC: 3.3 — ABNORMAL LOW
RBC: 3.52 — ABNORMAL LOW
RDW: 15
RDW: 15.3
RDW: 15.6 — ABNORMAL HIGH
WBC: 14 — ABNORMAL HIGH
WBC: 7

## 2011-09-30 LAB — GLUCOSE, CAPILLARY
Glucose-Capillary: 100 — ABNORMAL HIGH
Glucose-Capillary: 101 — ABNORMAL HIGH
Glucose-Capillary: 105 — ABNORMAL HIGH
Glucose-Capillary: 106 — ABNORMAL HIGH
Glucose-Capillary: 112 — ABNORMAL HIGH
Glucose-Capillary: 112 — ABNORMAL HIGH
Glucose-Capillary: 113 — ABNORMAL HIGH
Glucose-Capillary: 114 — ABNORMAL HIGH
Glucose-Capillary: 120 — ABNORMAL HIGH
Glucose-Capillary: 120 — ABNORMAL HIGH
Glucose-Capillary: 160 — ABNORMAL HIGH
Glucose-Capillary: 175 — ABNORMAL HIGH
Glucose-Capillary: 87
Glucose-Capillary: 88
Glucose-Capillary: 93
Glucose-Capillary: 94
Glucose-Capillary: 95

## 2011-09-30 LAB — DIFFERENTIAL
Basophils Absolute: 0
Basophils Relative: 0
Monocytes Relative: 9
Neutro Abs: 11.8 — ABNORMAL HIGH
Neutrophils Relative %: 84 — ABNORMAL HIGH

## 2011-09-30 LAB — URINALYSIS, ROUTINE W REFLEX MICROSCOPIC
Glucose, UA: 100 — AB
Ketones, ur: NEGATIVE
Leukocytes, UA: NEGATIVE
Nitrite: NEGATIVE
Specific Gravity, Urine: 1.022
Urobilinogen, UA: 1

## 2011-09-30 LAB — COMPREHENSIVE METABOLIC PANEL
AST: 27
Albumin: 3.6
BUN: 44 — ABNORMAL HIGH
CO2: 25
Calcium: 8.4
Calcium: 9.1
Creatinine, Ser: 1.23
Creatinine, Ser: 4.37 — ABNORMAL HIGH
GFR calc Af Amer: >60
GFR calc non Af Amer: 14 — ABNORMAL LOW
Glucose, Bld: 83
Total Bilirubin: 1.3 — ABNORMAL HIGH
Total Protein: 6

## 2011-09-30 LAB — ALDOSTERONE
Aldosterone, Serum: 1217 ng/dL
Aldosterone, Serum: 22 ng/dL
Aldosterone, Serum: 26 ng/dL
Aldosterone, Serum: 27 ng/dL
Aldosterone, Serum: 382 ng/dL
Aldosterone, Serum: 63 ng/dL

## 2011-09-30 LAB — PROTIME-INR
INR: 1.1
Prothrombin Time: 14.2

## 2011-09-30 LAB — BASIC METABOLIC PANEL
BUN: 38 — ABNORMAL HIGH
Chloride: 111
Creatinine, Ser: 3.39 — ABNORMAL HIGH
GFR calc Af Amer: 22 — ABNORMAL LOW
GFR calc non Af Amer: 18 — ABNORMAL LOW

## 2011-09-30 LAB — LIPASE, BLOOD: Lipase: 31

## 2011-09-30 LAB — CORTISOL
Cortisol, Plasma: 19
Cortisol, Plasma: 6.6
Cortisol, Plasma: 7.5
Cortisol, Plasma: 7.8
Cortisol, Plasma: 9.1

## 2011-09-30 LAB — APTT: aPTT: 37

## 2011-09-30 LAB — URINE MICROSCOPIC-ADD ON

## 2011-10-01 ENCOUNTER — Encounter: Payer: Self-pay | Admitting: *Deleted

## 2011-12-30 DIAGNOSIS — N186 End stage renal disease: Secondary | ICD-10-CM | POA: Diagnosis not present

## 2011-12-30 DIAGNOSIS — D509 Iron deficiency anemia, unspecified: Secondary | ICD-10-CM | POA: Diagnosis not present

## 2011-12-30 DIAGNOSIS — N2581 Secondary hyperparathyroidism of renal origin: Secondary | ICD-10-CM | POA: Diagnosis not present

## 2011-12-30 DIAGNOSIS — D631 Anemia in chronic kidney disease: Secondary | ICD-10-CM | POA: Diagnosis not present

## 2012-01-12 ENCOUNTER — Ambulatory Visit: Payer: Self-pay | Admitting: Pharmacist

## 2012-01-12 DIAGNOSIS — I4891 Unspecified atrial fibrillation: Secondary | ICD-10-CM

## 2012-01-20 DIAGNOSIS — I4891 Unspecified atrial fibrillation: Secondary | ICD-10-CM | POA: Diagnosis not present

## 2012-01-20 DIAGNOSIS — E119 Type 2 diabetes mellitus without complications: Secondary | ICD-10-CM | POA: Diagnosis not present

## 2012-01-20 DIAGNOSIS — Z79899 Other long term (current) drug therapy: Secondary | ICD-10-CM | POA: Diagnosis not present

## 2012-01-28 DIAGNOSIS — N186 End stage renal disease: Secondary | ICD-10-CM | POA: Diagnosis not present

## 2012-01-29 DIAGNOSIS — N186 End stage renal disease: Secondary | ICD-10-CM | POA: Diagnosis not present

## 2012-01-29 DIAGNOSIS — D631 Anemia in chronic kidney disease: Secondary | ICD-10-CM | POA: Diagnosis not present

## 2012-01-29 DIAGNOSIS — D509 Iron deficiency anemia, unspecified: Secondary | ICD-10-CM | POA: Diagnosis not present

## 2012-01-29 DIAGNOSIS — N2581 Secondary hyperparathyroidism of renal origin: Secondary | ICD-10-CM | POA: Diagnosis not present

## 2012-02-01 DIAGNOSIS — D509 Iron deficiency anemia, unspecified: Secondary | ICD-10-CM | POA: Diagnosis not present

## 2012-02-01 DIAGNOSIS — N186 End stage renal disease: Secondary | ICD-10-CM | POA: Diagnosis not present

## 2012-02-01 DIAGNOSIS — N2581 Secondary hyperparathyroidism of renal origin: Secondary | ICD-10-CM | POA: Diagnosis not present

## 2012-02-01 DIAGNOSIS — D631 Anemia in chronic kidney disease: Secondary | ICD-10-CM | POA: Diagnosis not present

## 2012-02-03 DIAGNOSIS — N2581 Secondary hyperparathyroidism of renal origin: Secondary | ICD-10-CM | POA: Diagnosis not present

## 2012-02-03 DIAGNOSIS — N186 End stage renal disease: Secondary | ICD-10-CM | POA: Diagnosis not present

## 2012-02-03 DIAGNOSIS — D509 Iron deficiency anemia, unspecified: Secondary | ICD-10-CM | POA: Diagnosis not present

## 2012-02-03 DIAGNOSIS — D631 Anemia in chronic kidney disease: Secondary | ICD-10-CM | POA: Diagnosis not present

## 2012-02-05 DIAGNOSIS — N039 Chronic nephritic syndrome with unspecified morphologic changes: Secondary | ICD-10-CM | POA: Diagnosis not present

## 2012-02-05 DIAGNOSIS — D509 Iron deficiency anemia, unspecified: Secondary | ICD-10-CM | POA: Diagnosis not present

## 2012-02-05 DIAGNOSIS — N186 End stage renal disease: Secondary | ICD-10-CM | POA: Diagnosis not present

## 2012-02-05 DIAGNOSIS — N2581 Secondary hyperparathyroidism of renal origin: Secondary | ICD-10-CM | POA: Diagnosis not present

## 2012-02-08 DIAGNOSIS — N186 End stage renal disease: Secondary | ICD-10-CM | POA: Diagnosis not present

## 2012-02-08 DIAGNOSIS — N039 Chronic nephritic syndrome with unspecified morphologic changes: Secondary | ICD-10-CM | POA: Diagnosis not present

## 2012-02-08 DIAGNOSIS — D509 Iron deficiency anemia, unspecified: Secondary | ICD-10-CM | POA: Diagnosis not present

## 2012-02-08 DIAGNOSIS — N2581 Secondary hyperparathyroidism of renal origin: Secondary | ICD-10-CM | POA: Diagnosis not present

## 2012-02-10 DIAGNOSIS — N186 End stage renal disease: Secondary | ICD-10-CM | POA: Diagnosis not present

## 2012-02-10 DIAGNOSIS — N039 Chronic nephritic syndrome with unspecified morphologic changes: Secondary | ICD-10-CM | POA: Diagnosis not present

## 2012-02-10 DIAGNOSIS — N2581 Secondary hyperparathyroidism of renal origin: Secondary | ICD-10-CM | POA: Diagnosis not present

## 2012-02-10 DIAGNOSIS — D509 Iron deficiency anemia, unspecified: Secondary | ICD-10-CM | POA: Diagnosis not present

## 2012-02-11 ENCOUNTER — Other Ambulatory Visit (HOSPITAL_COMMUNITY): Payer: Self-pay | Admitting: Nephrology

## 2012-02-11 DIAGNOSIS — N186 End stage renal disease: Secondary | ICD-10-CM

## 2012-02-12 DIAGNOSIS — N039 Chronic nephritic syndrome with unspecified morphologic changes: Secondary | ICD-10-CM | POA: Diagnosis not present

## 2012-02-12 DIAGNOSIS — N2581 Secondary hyperparathyroidism of renal origin: Secondary | ICD-10-CM | POA: Diagnosis not present

## 2012-02-12 DIAGNOSIS — N186 End stage renal disease: Secondary | ICD-10-CM | POA: Diagnosis not present

## 2012-02-12 DIAGNOSIS — D509 Iron deficiency anemia, unspecified: Secondary | ICD-10-CM | POA: Diagnosis not present

## 2012-02-15 DIAGNOSIS — D509 Iron deficiency anemia, unspecified: Secondary | ICD-10-CM | POA: Diagnosis not present

## 2012-02-15 DIAGNOSIS — D631 Anemia in chronic kidney disease: Secondary | ICD-10-CM | POA: Diagnosis not present

## 2012-02-15 DIAGNOSIS — N2581 Secondary hyperparathyroidism of renal origin: Secondary | ICD-10-CM | POA: Diagnosis not present

## 2012-02-15 DIAGNOSIS — N186 End stage renal disease: Secondary | ICD-10-CM | POA: Diagnosis not present

## 2012-02-17 DIAGNOSIS — N2581 Secondary hyperparathyroidism of renal origin: Secondary | ICD-10-CM | POA: Diagnosis not present

## 2012-02-17 DIAGNOSIS — D631 Anemia in chronic kidney disease: Secondary | ICD-10-CM | POA: Diagnosis not present

## 2012-02-17 DIAGNOSIS — D509 Iron deficiency anemia, unspecified: Secondary | ICD-10-CM | POA: Diagnosis not present

## 2012-02-17 DIAGNOSIS — N186 End stage renal disease: Secondary | ICD-10-CM | POA: Diagnosis not present

## 2012-02-18 ENCOUNTER — Ambulatory Visit (HOSPITAL_COMMUNITY)
Admission: RE | Admit: 2012-02-18 | Discharge: 2012-02-18 | Disposition: A | Discharge status: Home / Self Care | Payer: Medicare Other | Source: Ambulatory Visit | Attending: Nephrology | Admitting: Nephrology

## 2012-02-18 ENCOUNTER — Other Ambulatory Visit (HOSPITAL_COMMUNITY): Payer: Self-pay | Admitting: Nephrology

## 2012-02-18 DIAGNOSIS — E119 Type 2 diabetes mellitus without complications: Secondary | ICD-10-CM | POA: Insufficient documentation

## 2012-02-18 DIAGNOSIS — Y832 Surgical operation with anastomosis, bypass or graft as the cause of abnormal reaction of the patient, or of later complication, without mention of misadventure at the time of the procedure: Secondary | ICD-10-CM | POA: Insufficient documentation

## 2012-02-18 DIAGNOSIS — N186 End stage renal disease: Secondary | ICD-10-CM | POA: Insufficient documentation

## 2012-02-18 DIAGNOSIS — T82898A Other specified complication of vascular prosthetic devices, implants and grafts, initial encounter: Secondary | ICD-10-CM | POA: Insufficient documentation

## 2012-02-18 DIAGNOSIS — I509 Heart failure, unspecified: Secondary | ICD-10-CM | POA: Diagnosis not present

## 2012-02-18 DIAGNOSIS — G4733 Obstructive sleep apnea (adult) (pediatric): Secondary | ICD-10-CM | POA: Diagnosis not present

## 2012-02-18 DIAGNOSIS — I871 Compression of vein: Secondary | ICD-10-CM | POA: Diagnosis not present

## 2012-02-18 DIAGNOSIS — E785 Hyperlipidemia, unspecified: Secondary | ICD-10-CM | POA: Insufficient documentation

## 2012-02-18 DIAGNOSIS — M109 Gout, unspecified: Secondary | ICD-10-CM | POA: Diagnosis not present

## 2012-02-18 DIAGNOSIS — I120 Hypertensive chronic kidney disease with stage 5 chronic kidney disease or end stage renal disease: Secondary | ICD-10-CM | POA: Diagnosis not present

## 2012-02-18 MED ORDER — IOHEXOL 300 MG/ML  SOLN
100.0000 mL | Freq: Once | INTRAMUSCULAR | Status: AC | PRN
  Administered 2012-02-18: 55 mL via INTRAVENOUS

## 2012-02-18 NOTE — Procedures (Signed)
Shuntogram and 32mm venous PTA No complication No blood loss. See complete dictation in [facility].

## 2012-02-18 NOTE — H&P (Signed)
[name] is an 71 y.o. male.   Chief Complaint: low flows during dialysis HPI: Know to service from previous L FA graft declot, venous PTA and stent. 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 OSA (obstructive sleep apnea)   . DM (diabetes mellitus)   . CKD (chronic kidney disease)   . CHF (congestive heart failure)   . HTN (hypertension)   . Gout   . Hyperlipidemia     Past Surgical History  Procedure Date  . Left arm shuntogram.   . 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 Left forearm loop graft with 6 mm gore-tex graft.     No family history on file. Social History:  reports that he has quit smoking. He does not have any smokeless tobacco history on file. He reports that he does not drink alcohol or use illicit drugs.  Allergies: No Known Allergies  Medications Prior to Admission  Medication Sig Dispense Refill  . atorvastatin (LIPITOR) 40 MG tablet Take 1 tablet (40 mg total) by mouth daily.  30 tablet  3  . calcium acetate (PHOSLO) 667 MG capsule Take 3 tablets by mouth 3 (three) times daily.       . repaglinide (PRANDIN) 1 MG tablet Take 1 mg by mouth 3 (three) times daily before meals.       Marland Kitchen acetaminophen (TYLENOL) 325 MG tablet Take 650 mg by mouth every 6 (six) hours as needed.        Marland Kitchen amiodarone (PACERONE) 200 MG tablet Take 200 mg by mouth daily.        Marland Kitchen aspirin (BAYER ASPIRIN) 325 MG tablet Take 1 tablet (325 mg total) by mouth daily.  100 tablet  3  . B Complex-C-Folic Acid (NEPHRO-VITE RX PO) Take by mouth daily.        Marland Kitchen DARBEPOETIN ALFA-POLYSORBATE IJ Inject as directed.        . gabapentin (NEURONTIN) 100 MG tablet Take 100 mg by mouth daily.        . metoprolol succinate (TOPROL-XL) 25 MG 24 hr tablet Take 25 mg by mouth daily.        . paricalcitol (ZEMPLAR) 4 MCG capsule Take 4 mg by mouth as directed.        No current facility-administered medications on file as of 02/18/2012.    No  results found for this or any previous visit (from the past 48 hour(s)). No results found.  ROS  There were no vitals taken for this visit. Physical Exam  Constitutional: He appears well-developed and well-nourished. He is not intubated.  HENT:  Head: Atraumatic.  Eyes: Right eye exhibits no discharge.  Cardiovascular: Normal heart sounds.   Respiratory: Effort normal and breath sounds normal. No stridor. No apnea and not tachypneic. He is not intubated. No respiratory distress. He has no wheezes. He has no rales. He exhibits no tenderness.     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 Reviewed procedure, technique, anticipated benefits, possible risks. He consents to proceed.  Shayden Gingrich III,[name] 02/18/2012, 8:19 AM

## 2012-02-19 ENCOUNTER — Telehealth (HOSPITAL_COMMUNITY): Payer: Self-pay | Admitting: *Deleted

## 2012-02-19 DIAGNOSIS — D509 Iron deficiency anemia, unspecified: Secondary | ICD-10-CM | POA: Diagnosis not present

## 2012-02-19 DIAGNOSIS — N2581 Secondary hyperparathyroidism of renal origin: Secondary | ICD-10-CM | POA: Diagnosis not present

## 2012-02-19 DIAGNOSIS — N186 End stage renal disease: Secondary | ICD-10-CM | POA: Diagnosis not present

## 2012-02-19 DIAGNOSIS — N039 Chronic nephritic syndrome with unspecified morphologic changes: Secondary | ICD-10-CM | POA: Diagnosis not present

## 2012-02-19 NOTE — Telephone Encounter (Signed)
Post procedure follow up call.  rec'd message saying mailbox was full and could not accept any additional messages.

## 2012-02-22 DIAGNOSIS — D631 Anemia in chronic kidney disease: Secondary | ICD-10-CM | POA: Diagnosis not present

## 2012-02-22 DIAGNOSIS — N186 End stage renal disease: Secondary | ICD-10-CM | POA: Diagnosis not present

## 2012-02-22 DIAGNOSIS — N2581 Secondary hyperparathyroidism of renal origin: Secondary | ICD-10-CM | POA: Diagnosis not present

## 2012-02-22 DIAGNOSIS — D509 Iron deficiency anemia, unspecified: Secondary | ICD-10-CM | POA: Diagnosis not present

## 2012-02-24 DIAGNOSIS — N2581 Secondary hyperparathyroidism of renal origin: Secondary | ICD-10-CM | POA: Diagnosis not present

## 2012-02-24 DIAGNOSIS — D509 Iron deficiency anemia, unspecified: Secondary | ICD-10-CM | POA: Diagnosis not present

## 2012-02-24 DIAGNOSIS — N186 End stage renal disease: Secondary | ICD-10-CM | POA: Diagnosis not present

## 2012-02-24 DIAGNOSIS — N039 Chronic nephritic syndrome with unspecified morphologic changes: Secondary | ICD-10-CM | POA: Diagnosis not present

## 2012-02-25 ENCOUNTER — Encounter (INDEPENDENT_AMBULATORY_CARE_PROVIDER_SITE_OTHER): Payer: Medicare Other | Admitting: Ophthalmology

## 2012-02-25 DIAGNOSIS — N186 End stage renal disease: Secondary | ICD-10-CM | POA: Diagnosis not present

## 2012-02-26 DIAGNOSIS — Z992 Dependence on renal dialysis: Secondary | ICD-10-CM | POA: Diagnosis not present

## 2012-02-26 DIAGNOSIS — D509 Iron deficiency anemia, unspecified: Secondary | ICD-10-CM | POA: Diagnosis not present

## 2012-02-26 DIAGNOSIS — N186 End stage renal disease: Secondary | ICD-10-CM | POA: Diagnosis not present

## 2012-02-26 DIAGNOSIS — N2581 Secondary hyperparathyroidism of renal origin: Secondary | ICD-10-CM | POA: Diagnosis not present

## 2012-02-29 DIAGNOSIS — N186 End stage renal disease: Secondary | ICD-10-CM | POA: Diagnosis not present

## 2012-02-29 DIAGNOSIS — N2581 Secondary hyperparathyroidism of renal origin: Secondary | ICD-10-CM | POA: Diagnosis not present

## 2012-02-29 DIAGNOSIS — D509 Iron deficiency anemia, unspecified: Secondary | ICD-10-CM | POA: Diagnosis not present

## 2012-02-29 DIAGNOSIS — Z992 Dependence on renal dialysis: Secondary | ICD-10-CM | POA: Diagnosis not present

## 2012-03-01 DIAGNOSIS — R972 Elevated prostate specific antigen [PSA]: Secondary | ICD-10-CM | POA: Diagnosis not present

## 2012-03-02 DIAGNOSIS — Z992 Dependence on renal dialysis: Secondary | ICD-10-CM | POA: Diagnosis not present

## 2012-03-02 DIAGNOSIS — N186 End stage renal disease: Secondary | ICD-10-CM | POA: Diagnosis not present

## 2012-03-02 DIAGNOSIS — N2581 Secondary hyperparathyroidism of renal origin: Secondary | ICD-10-CM | POA: Diagnosis not present

## 2012-03-02 DIAGNOSIS — D509 Iron deficiency anemia, unspecified: Secondary | ICD-10-CM | POA: Diagnosis not present

## 2012-03-04 DIAGNOSIS — N2581 Secondary hyperparathyroidism of renal origin: Secondary | ICD-10-CM | POA: Diagnosis not present

## 2012-03-04 DIAGNOSIS — N186 End stage renal disease: Secondary | ICD-10-CM | POA: Diagnosis not present

## 2012-03-04 DIAGNOSIS — Z992 Dependence on renal dialysis: Secondary | ICD-10-CM | POA: Diagnosis not present

## 2012-03-04 DIAGNOSIS — D509 Iron deficiency anemia, unspecified: Secondary | ICD-10-CM | POA: Diagnosis not present

## 2012-03-07 DIAGNOSIS — D509 Iron deficiency anemia, unspecified: Secondary | ICD-10-CM | POA: Diagnosis not present

## 2012-03-07 DIAGNOSIS — N186 End stage renal disease: Secondary | ICD-10-CM | POA: Diagnosis not present

## 2012-03-07 DIAGNOSIS — Z992 Dependence on renal dialysis: Secondary | ICD-10-CM | POA: Diagnosis not present

## 2012-03-07 DIAGNOSIS — N2581 Secondary hyperparathyroidism of renal origin: Secondary | ICD-10-CM | POA: Diagnosis not present

## 2012-03-09 DIAGNOSIS — Z992 Dependence on renal dialysis: Secondary | ICD-10-CM | POA: Diagnosis not present

## 2012-03-09 DIAGNOSIS — D509 Iron deficiency anemia, unspecified: Secondary | ICD-10-CM | POA: Diagnosis not present

## 2012-03-09 DIAGNOSIS — N186 End stage renal disease: Secondary | ICD-10-CM | POA: Diagnosis not present

## 2012-03-09 DIAGNOSIS — N2581 Secondary hyperparathyroidism of renal origin: Secondary | ICD-10-CM | POA: Diagnosis not present

## 2012-03-11 DIAGNOSIS — N2581 Secondary hyperparathyroidism of renal origin: Secondary | ICD-10-CM | POA: Diagnosis not present

## 2012-03-11 DIAGNOSIS — Z992 Dependence on renal dialysis: Secondary | ICD-10-CM | POA: Diagnosis not present

## 2012-03-11 DIAGNOSIS — N186 End stage renal disease: Secondary | ICD-10-CM | POA: Diagnosis not present

## 2012-03-11 DIAGNOSIS — D509 Iron deficiency anemia, unspecified: Secondary | ICD-10-CM | POA: Diagnosis not present

## 2012-03-14 DIAGNOSIS — N2581 Secondary hyperparathyroidism of renal origin: Secondary | ICD-10-CM | POA: Diagnosis not present

## 2012-03-14 DIAGNOSIS — Z992 Dependence on renal dialysis: Secondary | ICD-10-CM | POA: Diagnosis not present

## 2012-03-14 DIAGNOSIS — D509 Iron deficiency anemia, unspecified: Secondary | ICD-10-CM | POA: Diagnosis not present

## 2012-03-14 DIAGNOSIS — N186 End stage renal disease: Secondary | ICD-10-CM | POA: Diagnosis not present

## 2012-03-16 DIAGNOSIS — N186 End stage renal disease: Secondary | ICD-10-CM | POA: Diagnosis not present

## 2012-03-16 DIAGNOSIS — N2581 Secondary hyperparathyroidism of renal origin: Secondary | ICD-10-CM | POA: Diagnosis not present

## 2012-03-16 DIAGNOSIS — Z992 Dependence on renal dialysis: Secondary | ICD-10-CM | POA: Diagnosis not present

## 2012-03-16 DIAGNOSIS — D509 Iron deficiency anemia, unspecified: Secondary | ICD-10-CM | POA: Diagnosis not present

## 2012-03-18 DIAGNOSIS — Z992 Dependence on renal dialysis: Secondary | ICD-10-CM | POA: Diagnosis not present

## 2012-03-18 DIAGNOSIS — N186 End stage renal disease: Secondary | ICD-10-CM | POA: Diagnosis not present

## 2012-03-18 DIAGNOSIS — N2581 Secondary hyperparathyroidism of renal origin: Secondary | ICD-10-CM | POA: Diagnosis not present

## 2012-03-18 DIAGNOSIS — D509 Iron deficiency anemia, unspecified: Secondary | ICD-10-CM | POA: Diagnosis not present

## 2012-03-21 DIAGNOSIS — D509 Iron deficiency anemia, unspecified: Secondary | ICD-10-CM | POA: Diagnosis not present

## 2012-03-21 DIAGNOSIS — N186 End stage renal disease: Secondary | ICD-10-CM | POA: Diagnosis not present

## 2012-03-21 DIAGNOSIS — Z992 Dependence on renal dialysis: Secondary | ICD-10-CM | POA: Diagnosis not present

## 2012-03-21 DIAGNOSIS — N2581 Secondary hyperparathyroidism of renal origin: Secondary | ICD-10-CM | POA: Diagnosis not present

## 2012-03-23 DIAGNOSIS — N186 End stage renal disease: Secondary | ICD-10-CM | POA: Diagnosis not present

## 2012-03-23 DIAGNOSIS — D509 Iron deficiency anemia, unspecified: Secondary | ICD-10-CM | POA: Diagnosis not present

## 2012-03-23 DIAGNOSIS — N2581 Secondary hyperparathyroidism of renal origin: Secondary | ICD-10-CM | POA: Diagnosis not present

## 2012-03-23 DIAGNOSIS — Z992 Dependence on renal dialysis: Secondary | ICD-10-CM | POA: Diagnosis not present

## 2012-03-25 DIAGNOSIS — N2581 Secondary hyperparathyroidism of renal origin: Secondary | ICD-10-CM | POA: Diagnosis not present

## 2012-03-25 DIAGNOSIS — N186 End stage renal disease: Secondary | ICD-10-CM | POA: Diagnosis not present

## 2012-03-25 DIAGNOSIS — Z992 Dependence on renal dialysis: Secondary | ICD-10-CM | POA: Diagnosis not present

## 2012-03-25 DIAGNOSIS — D509 Iron deficiency anemia, unspecified: Secondary | ICD-10-CM | POA: Diagnosis not present

## 2012-03-27 DIAGNOSIS — N186 End stage renal disease: Secondary | ICD-10-CM | POA: Diagnosis not present

## 2012-03-28 DIAGNOSIS — N186 End stage renal disease: Secondary | ICD-10-CM | POA: Diagnosis not present

## 2012-03-28 DIAGNOSIS — D631 Anemia in chronic kidney disease: Secondary | ICD-10-CM | POA: Diagnosis not present

## 2012-03-28 DIAGNOSIS — Z992 Dependence on renal dialysis: Secondary | ICD-10-CM | POA: Diagnosis not present

## 2012-03-28 DIAGNOSIS — N2581 Secondary hyperparathyroidism of renal origin: Secondary | ICD-10-CM | POA: Diagnosis not present

## 2012-03-28 DIAGNOSIS — D509 Iron deficiency anemia, unspecified: Secondary | ICD-10-CM | POA: Diagnosis not present

## 2012-04-07 ENCOUNTER — Encounter (INDEPENDENT_AMBULATORY_CARE_PROVIDER_SITE_OTHER): Payer: Medicare Other | Admitting: Ophthalmology

## 2012-04-14 ENCOUNTER — Encounter (INDEPENDENT_AMBULATORY_CARE_PROVIDER_SITE_OTHER): Payer: Medicare Other | Admitting: Ophthalmology

## 2012-04-20 DIAGNOSIS — I4891 Unspecified atrial fibrillation: Secondary | ICD-10-CM | POA: Diagnosis not present

## 2012-04-20 DIAGNOSIS — E119 Type 2 diabetes mellitus without complications: Secondary | ICD-10-CM | POA: Diagnosis not present

## 2012-04-20 DIAGNOSIS — Z79899 Other long term (current) drug therapy: Secondary | ICD-10-CM | POA: Diagnosis not present

## 2012-04-21 ENCOUNTER — Encounter (INDEPENDENT_AMBULATORY_CARE_PROVIDER_SITE_OTHER): Payer: Medicare Other | Admitting: Ophthalmology

## 2012-04-26 DIAGNOSIS — N186 End stage renal disease: Secondary | ICD-10-CM | POA: Diagnosis not present

## 2012-04-27 DIAGNOSIS — D631 Anemia in chronic kidney disease: Secondary | ICD-10-CM | POA: Diagnosis not present

## 2012-04-27 DIAGNOSIS — D509 Iron deficiency anemia, unspecified: Secondary | ICD-10-CM | POA: Diagnosis not present

## 2012-04-27 DIAGNOSIS — N186 End stage renal disease: Secondary | ICD-10-CM | POA: Diagnosis not present

## 2012-04-27 DIAGNOSIS — Z992 Dependence on renal dialysis: Secondary | ICD-10-CM | POA: Diagnosis not present

## 2012-04-27 DIAGNOSIS — N2581 Secondary hyperparathyroidism of renal origin: Secondary | ICD-10-CM | POA: Diagnosis not present

## 2012-04-27 DIAGNOSIS — E119 Type 2 diabetes mellitus without complications: Secondary | ICD-10-CM | POA: Diagnosis not present

## 2012-04-28 ENCOUNTER — Encounter (INDEPENDENT_AMBULATORY_CARE_PROVIDER_SITE_OTHER): Payer: Medicare Other | Admitting: Ophthalmology

## 2012-04-29 DIAGNOSIS — N186 End stage renal disease: Secondary | ICD-10-CM | POA: Diagnosis not present

## 2012-04-29 DIAGNOSIS — Z992 Dependence on renal dialysis: Secondary | ICD-10-CM | POA: Diagnosis not present

## 2012-04-29 DIAGNOSIS — N2581 Secondary hyperparathyroidism of renal origin: Secondary | ICD-10-CM | POA: Diagnosis not present

## 2012-04-29 DIAGNOSIS — E119 Type 2 diabetes mellitus without complications: Secondary | ICD-10-CM | POA: Diagnosis not present

## 2012-04-29 DIAGNOSIS — N039 Chronic nephritic syndrome with unspecified morphologic changes: Secondary | ICD-10-CM | POA: Diagnosis not present

## 2012-04-29 DIAGNOSIS — D509 Iron deficiency anemia, unspecified: Secondary | ICD-10-CM | POA: Diagnosis not present

## 2012-05-02 DIAGNOSIS — N186 End stage renal disease: Secondary | ICD-10-CM | POA: Diagnosis not present

## 2012-05-02 DIAGNOSIS — E119 Type 2 diabetes mellitus without complications: Secondary | ICD-10-CM | POA: Diagnosis not present

## 2012-05-02 DIAGNOSIS — N039 Chronic nephritic syndrome with unspecified morphologic changes: Secondary | ICD-10-CM | POA: Diagnosis not present

## 2012-05-02 DIAGNOSIS — Z992 Dependence on renal dialysis: Secondary | ICD-10-CM | POA: Diagnosis not present

## 2012-05-02 DIAGNOSIS — N2581 Secondary hyperparathyroidism of renal origin: Secondary | ICD-10-CM | POA: Diagnosis not present

## 2012-05-02 DIAGNOSIS — D509 Iron deficiency anemia, unspecified: Secondary | ICD-10-CM | POA: Diagnosis not present

## 2012-05-04 DIAGNOSIS — Z992 Dependence on renal dialysis: Secondary | ICD-10-CM | POA: Diagnosis not present

## 2012-05-04 DIAGNOSIS — D509 Iron deficiency anemia, unspecified: Secondary | ICD-10-CM | POA: Diagnosis not present

## 2012-05-04 DIAGNOSIS — E119 Type 2 diabetes mellitus without complications: Secondary | ICD-10-CM | POA: Diagnosis not present

## 2012-05-04 DIAGNOSIS — N039 Chronic nephritic syndrome with unspecified morphologic changes: Secondary | ICD-10-CM | POA: Diagnosis not present

## 2012-05-04 DIAGNOSIS — N186 End stage renal disease: Secondary | ICD-10-CM | POA: Diagnosis not present

## 2012-05-04 DIAGNOSIS — N2581 Secondary hyperparathyroidism of renal origin: Secondary | ICD-10-CM | POA: Diagnosis not present

## 2012-05-06 DIAGNOSIS — N039 Chronic nephritic syndrome with unspecified morphologic changes: Secondary | ICD-10-CM | POA: Diagnosis not present

## 2012-05-06 DIAGNOSIS — Z992 Dependence on renal dialysis: Secondary | ICD-10-CM | POA: Diagnosis not present

## 2012-05-06 DIAGNOSIS — E119 Type 2 diabetes mellitus without complications: Secondary | ICD-10-CM | POA: Diagnosis not present

## 2012-05-06 DIAGNOSIS — D509 Iron deficiency anemia, unspecified: Secondary | ICD-10-CM | POA: Diagnosis not present

## 2012-05-06 DIAGNOSIS — N2581 Secondary hyperparathyroidism of renal origin: Secondary | ICD-10-CM | POA: Diagnosis not present

## 2012-05-06 DIAGNOSIS — N186 End stage renal disease: Secondary | ICD-10-CM | POA: Diagnosis not present

## 2012-05-09 DIAGNOSIS — E119 Type 2 diabetes mellitus without complications: Secondary | ICD-10-CM | POA: Diagnosis not present

## 2012-05-09 DIAGNOSIS — D509 Iron deficiency anemia, unspecified: Secondary | ICD-10-CM | POA: Diagnosis not present

## 2012-05-09 DIAGNOSIS — Z992 Dependence on renal dialysis: Secondary | ICD-10-CM | POA: Diagnosis not present

## 2012-05-09 DIAGNOSIS — N039 Chronic nephritic syndrome with unspecified morphologic changes: Secondary | ICD-10-CM | POA: Diagnosis not present

## 2012-05-09 DIAGNOSIS — N2581 Secondary hyperparathyroidism of renal origin: Secondary | ICD-10-CM | POA: Diagnosis not present

## 2012-05-09 DIAGNOSIS — N186 End stage renal disease: Secondary | ICD-10-CM | POA: Diagnosis not present

## 2012-05-11 DIAGNOSIS — D509 Iron deficiency anemia, unspecified: Secondary | ICD-10-CM | POA: Diagnosis not present

## 2012-05-11 DIAGNOSIS — E119 Type 2 diabetes mellitus without complications: Secondary | ICD-10-CM | POA: Diagnosis not present

## 2012-05-11 DIAGNOSIS — N186 End stage renal disease: Secondary | ICD-10-CM | POA: Diagnosis not present

## 2012-05-11 DIAGNOSIS — N2581 Secondary hyperparathyroidism of renal origin: Secondary | ICD-10-CM | POA: Diagnosis not present

## 2012-05-11 DIAGNOSIS — Z992 Dependence on renal dialysis: Secondary | ICD-10-CM | POA: Diagnosis not present

## 2012-05-11 DIAGNOSIS — N039 Chronic nephritic syndrome with unspecified morphologic changes: Secondary | ICD-10-CM | POA: Diagnosis not present

## 2012-05-13 DIAGNOSIS — D509 Iron deficiency anemia, unspecified: Secondary | ICD-10-CM | POA: Diagnosis not present

## 2012-05-13 DIAGNOSIS — D631 Anemia in chronic kidney disease: Secondary | ICD-10-CM | POA: Diagnosis not present

## 2012-05-13 DIAGNOSIS — Z992 Dependence on renal dialysis: Secondary | ICD-10-CM | POA: Diagnosis not present

## 2012-05-13 DIAGNOSIS — N186 End stage renal disease: Secondary | ICD-10-CM | POA: Diagnosis not present

## 2012-05-13 DIAGNOSIS — E119 Type 2 diabetes mellitus without complications: Secondary | ICD-10-CM | POA: Diagnosis not present

## 2012-05-13 DIAGNOSIS — N2581 Secondary hyperparathyroidism of renal origin: Secondary | ICD-10-CM | POA: Diagnosis not present

## 2012-05-16 DIAGNOSIS — Z992 Dependence on renal dialysis: Secondary | ICD-10-CM | POA: Diagnosis not present

## 2012-05-16 DIAGNOSIS — N186 End stage renal disease: Secondary | ICD-10-CM | POA: Diagnosis not present

## 2012-05-16 DIAGNOSIS — D631 Anemia in chronic kidney disease: Secondary | ICD-10-CM | POA: Diagnosis not present

## 2012-05-16 DIAGNOSIS — N039 Chronic nephritic syndrome with unspecified morphologic changes: Secondary | ICD-10-CM | POA: Diagnosis not present

## 2012-05-16 DIAGNOSIS — E119 Type 2 diabetes mellitus without complications: Secondary | ICD-10-CM | POA: Diagnosis not present

## 2012-05-16 DIAGNOSIS — N2581 Secondary hyperparathyroidism of renal origin: Secondary | ICD-10-CM | POA: Diagnosis not present

## 2012-05-16 DIAGNOSIS — D509 Iron deficiency anemia, unspecified: Secondary | ICD-10-CM | POA: Diagnosis not present

## 2012-05-18 ENCOUNTER — Other Ambulatory Visit (HOSPITAL_COMMUNITY): Payer: Self-pay | Admitting: Nephrology

## 2012-05-18 DIAGNOSIS — Z992 Dependence on renal dialysis: Secondary | ICD-10-CM

## 2012-05-19 ENCOUNTER — Other Ambulatory Visit (HOSPITAL_COMMUNITY): Payer: Self-pay | Admitting: Nephrology

## 2012-05-19 ENCOUNTER — Encounter (INDEPENDENT_AMBULATORY_CARE_PROVIDER_SITE_OTHER): Payer: Medicare Other | Admitting: Ophthalmology

## 2012-05-19 ENCOUNTER — Encounter (HOSPITAL_COMMUNITY): Payer: Self-pay

## 2012-05-19 ENCOUNTER — Ambulatory Visit (HOSPITAL_COMMUNITY)
Admission: RE | Admit: 2012-05-19 | Discharge: 2012-05-19 | Disposition: A | Discharge status: Home / Self Care | Payer: Medicare Other | Source: Ambulatory Visit | Attending: Nephrology | Admitting: Nephrology

## 2012-05-19 VITALS — BP 167/53 | HR 68 | Temp 98.1°F | Resp 16

## 2012-05-19 DIAGNOSIS — Z992 Dependence on renal dialysis: Secondary | ICD-10-CM | POA: Insufficient documentation

## 2012-05-19 DIAGNOSIS — E119 Type 2 diabetes mellitus without complications: Secondary | ICD-10-CM | POA: Diagnosis not present

## 2012-05-19 DIAGNOSIS — G4733 Obstructive sleep apnea (adult) (pediatric): Secondary | ICD-10-CM | POA: Insufficient documentation

## 2012-05-19 DIAGNOSIS — I120 Hypertensive chronic kidney disease with stage 5 chronic kidney disease or end stage renal disease: Secondary | ICD-10-CM | POA: Diagnosis not present

## 2012-05-19 DIAGNOSIS — I509 Heart failure, unspecified: Secondary | ICD-10-CM | POA: Insufficient documentation

## 2012-05-19 DIAGNOSIS — T82898A Other specified complication of vascular prosthetic devices, implants and grafts, initial encounter: Secondary | ICD-10-CM | POA: Insufficient documentation

## 2012-05-19 DIAGNOSIS — N186 End stage renal disease: Secondary | ICD-10-CM

## 2012-05-19 DIAGNOSIS — Y849 Medical procedure, unspecified as the cause of abnormal reaction of the patient, or of later complication, without mention of misadventure at the time of the procedure: Secondary | ICD-10-CM | POA: Insufficient documentation

## 2012-05-19 HISTORY — DX: Anemia, unspecified: D64.9

## 2012-05-19 HISTORY — DX: Unspecified atrial fibrillation: I48.91

## 2012-05-19 HISTORY — DX: Benign prostatic hyperplasia without lower urinary tract symptoms: N40.0

## 2012-05-19 HISTORY — DX: Personal history of other diseases of the digestive system: Z87.19

## 2012-05-19 HISTORY — DX: Secondary hyperparathyroidism of renal origin: N25.81

## 2012-05-19 LAB — GLUCOSE, CAPILLARY: Glucose-Capillary: 95 mg/dL (ref 70–99)

## 2012-05-19 LAB — POTASSIUM: Potassium: 3.5 mEq/L (ref 3.5–5.1)

## 2012-05-19 MED ORDER — MIDAZOLAM HCL 5 MG/5ML IJ SOLN
INTRAMUSCULAR | Status: AC | PRN
  Administered 2012-05-19 (×2): 0.5 mg via INTRAVENOUS

## 2012-05-19 MED ORDER — SODIUM CHLORIDE 0.9 % IV SOLN
INTRAVENOUS | Status: AC | PRN
  Administered 2012-05-19: 30 mL via INTRAVENOUS

## 2012-05-19 MED ORDER — HEPARIN SODIUM (PORCINE) 1000 UNIT/ML IJ SOLN
INTRAMUSCULAR | Status: AC | PRN
  Administered 2012-05-19 (×2): 3000 [IU] via INTRAVENOUS

## 2012-05-19 MED ORDER — FENTANYL CITRATE 0.05 MG/ML IJ SOLN
INTRAMUSCULAR | Status: AC
  Filled 2012-05-19: qty 4

## 2012-05-19 MED ORDER — ALTEPLASE 2 MG IJ SOLR
4.0000 mg | Freq: Once | INTRAMUSCULAR | Status: AC
  Administered 2012-05-19: 4 mg
  Filled 2012-05-19: qty 4

## 2012-05-19 MED ORDER — HEPARIN SODIUM (PORCINE) 1000 UNIT/ML IJ SOLN
INTRAMUSCULAR | Status: AC
  Filled 2012-05-19: qty 1

## 2012-05-19 MED ORDER — FENTANYL CITRATE 0.05 MG/ML IJ SOLN
INTRAMUSCULAR | Status: AC | PRN
  Administered 2012-05-19: 25 ug via INTRAVENOUS
  Administered 2012-05-19 (×2): 12.5 ug via INTRAVENOUS
  Administered 2012-05-19: 25 ug via INTRAVENOUS

## 2012-05-19 MED ORDER — MIDAZOLAM HCL 2 MG/2ML IJ SOLN
INTRAMUSCULAR | Status: AC
  Filled 2012-05-19: qty 4

## 2012-05-19 MED ORDER — IOHEXOL 300 MG/ML  SOLN
100.0000 mL | Freq: Once | INTRAMUSCULAR | Status: AC | PRN
  Administered 2012-05-19: 60 mL via INTRAVENOUS

## 2012-05-19 NOTE — ED Notes (Signed)
O2 2L/[place] placed

## 2012-05-19 NOTE — ED Notes (Signed)
Unable to print dc instructions.Verbally instructed about moderate sedation and infection.

## 2012-05-19 NOTE — ED Notes (Signed)
Pain with ballooning

## 2012-05-19 NOTE — H&P (Signed)
[name] is an 71 y.o. male.   Chief Complaint: Clotted left arm AVG noted at HD.   HPI: Recent difficult cannulation - graft noted to be clotted on Monday at dialysis.    Past Medical History  Diagnosis Date  . OSA (obstructive sleep apnea)   . DM (diabetes mellitus)   . CKD (chronic kidney disease)   . CHF (congestive heart failure)   . HTN (hypertension)   . Gout   . Hyperlipidemia   . Chronic kidney disease   . Secondary hyperparathyroidism of renal origin   . Anemia   . BPH (benign prostatic hyperplasia)   . A-fib   . History of GI bleed     secondary to coumadin    Past Surgical History  Procedure Date  . Left arm shuntogram.   . 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 Left forearm loop graft with 6 mm gore-tex graft.   . Cholecystectomy     History reviewed. No pertinent family history. Social History:  reports that he has quit smoking. He does not have any smokeless tobacco history on file. He reports that he does not drink alcohol or use illicit drugs.  Allergies: No Known Allergies   Results for orders placed during the hospital encounter of 05/19/12 (from the past 48 hour(s))  POTASSIUM     Status: Normal   Collection Time   05/19/12  7:40 AM      Component Value Range Comment   Potassium 3.5  3.5 - 5.1 (mEq/L)   GLUCOSE, CAPILLARY     Status: Normal   Collection Time   05/19/12  7:46 AM      Component Value Range Comment   Glucose-Capillary 95  70 - 99 (mg/dL)     Review of Systems  Constitutional: Negative for fever, chills and weight loss.  HENT: Negative.   Respiratory: Negative for cough, hemoptysis and sputum production.   Cardiovascular: Negative for chest pain, palpitations and leg swelling.  Genitourinary:       ESRD   Skin: Negative.   Neurological:       Gait instability secondary to intermittent vertigo  Psychiatric/Behavioral: Negative.     Blood pressure 173/63, pulse 63, temperature 98.1 F (36.7 C), temperature  source Oral, resp. rate 14, SpO2 97.00%. Physical Exam  Constitutional: He appears well-developed and well-nourished. No distress.  HENT:  Head: Normocephalic and atraumatic.  Cardiovascular: Normal rate and regular rhythm.  Exam reveals no gallop and no friction rub.   No murmur heard. Respiratory: Effort normal and breath sounds normal. No respiratory distress. He has no wheezes.  GI: Soft.  Musculoskeletal: Normal range of motion.       Left forearm graft with nonpalpable thrill, slightly red with warmth near elbow ? Due to underlying clot.    Skin: He is not diaphoretic.     Assessment/Plan Procedure for declot of his AVG discussed in detail.  Potential complications including but not limited to infection, bleeding, vessel damage, complications with moderate sedation and unsuccessful declot discussed with the patient's apparent understanding.  HD catheter will be placed if unsuccessful - patient aware.  Patient also advised to monitor site of erythema and warmth to make sure not and underlying infection brewing.  Signs discussed with him and spouse.  Written consent obtained for declot.  K+ results = 3.5.   [name] D 05/19/2012, 8:39 AM

## 2012-05-19 NOTE — ED Notes (Signed)
O2 d/c'd 

## 2012-05-19 NOTE — Procedures (Signed)
Technically successful declot of left forearm dialysis graft.  No immediate post procedural complications.

## 2012-05-19 NOTE — Discharge Instructions (Addendum)
Arteriovenous (AV) Access for Hemodialysis An arteriovenous (AV) access is a surgically created or placed tube that allows for repeated access to the blood in your body. This access is required for hemodialysis, a type of dialysis. Dialysis is a treatment process that filters and cleans the blood in order to eliminate toxic wastes from the body when the kidneys fail to do this on their own. There are several different access methods used for hemodialysis. ACCESS METHODS  Double lumen catheter. A flexible tube with 2 channels may be used on a temporary or long-term basis. A long-term use catheter often has a cuff that holds it in place. The catheter is surgically placed and tunneled under the skin. This catheter is often placed when dialysis is needed in an emergency, such as when the kidneys suddenly stop working. It may also be needed when a permanent AV access fails or has not yet been placed. A catheter is usually placed into one of the following:   Large vein under your collarbone (subclavian vein).   Large vein in your neck (jugular vein).   Temporarily, in the large vein in your groin (femoral vein).   AV graft. A man-made (synthetic) material may be used to connect an artery and vein in the arm or thigh. This graft takes around 2 weeks to develop (mature) and is often placed a few weeks prior to use. A graft can generally last from 1 to 2 years.   AV fistula. A minor surgical procedure may be done to connect an artery and vein, creating a fistula. This causes arterial blood to flow directly into a vein. The vein gets larger, allowing easier access for dialysis. The fistula takes around 12 weeks to mature and must be placed several months before dialysis is anticipated. A fistula provides the best access for hemodialysis and can last for several years.  It is critical that veins in patients at high risk to develop kidney failure are preserved. This may include avoiding blood pressure checks and  intravenous (IV) or lab draws from the arm. This maximizes the chance for creating a functioning AV access when needed. Although a fistula is the most desirable access to use for hemodialysis, it may not be possible. If the veins are not large enough or there is no time to wait for a fistula to mature, a graft or catheter may be used. RISKS AND COMPLICATIONS   Double lumen catheter. A catheter may develop serious infections. It may also develop a clot (thrombosis) and fail. A catheter can cause clots in the vein in which it is placed. A subclavian vein catheter is the most likely to cause thrombosis. When the subclavian vein clots, it makes it very difficult for the patient to sustain an AV graft or fistula. When possible, catheters should be avoided and a more permanent AV access should be placed.   AV graft. A graft may swell after surgery, but this should decrease as it heals. A graft may stop working properly due to thrombosis or the diameter of the tube getting smaller. The tube can eventually become blocked (stenosis). If a partial blockage is found relatively early, it can be treated. Left untreated, the stenosis will progress until the vessel is completely blocked. Infection may also occur.   AV fistula. Infection and thrombosis are the biggest risks with a fistula. However, the rates for infection and stenosis are lower with fistulas than the other 2 methods. Frequent thrombosis may require creating a backup fistula at another site.   This will allow for dialysis when one access is blocked.  HOME CARE INSTRUCTIONS   Keep the site of the cut (incision) clean and dry while it heals. This helps prevent infection.   If you have a catheter, do not shower while the incision is healing. After it is healed, ask your caregiver for recommendations on showering. The bandage (dressing) will be changed at the dialysis center. Do not remove this dressing. However, if it becomes wet or loose, a sterile gauze  dressing may be placed over the site and secured by placing adhesive tape on the edges.   If you have a graft or fistula, clean the site daily until it heals completely. Stitches will be removed, usually after about 10 to 14 days. Once the access is being used for dialysis, a small dressing will be placed over the needle sites after the treatment is done. Keep this dressing on for at least 12 hours. Keep your arm or thigh clean and dry during this time.   A small amount of bleeding is normal, especially if the access is new. If you have a large amount of bleeding and cannot stop it, this is not normal. Call your caregiver right away. You will be taught how to hold your graft or fistula sites to stop the bleeding.  If you have a graft or fistula:  A "bruit" is a noise that is heard with a stethoscope and a "thrill" is a vibration felt over the graft or fistula. The presence of the bruit and thrill indicate that the access is working. You will be taught to feel for the thrill each day. If this is not felt, the access may be clotted. Call your caregiver.   You may use the arm freely after the site heals. Keep the following in mind:   Avoid pressure on the arm.   Avoid lifting heavy objects with the arm.   Avoid sleeping on the arm with the graft or fistula.   Avoid wearing tight-sleeved shirts or jewelry around the graft or fistula.   Do not allow blood pressure monitoring or needle punctures on the side where the graft or fistula is located.   With permission from your caregiver, you may do exercises to help with blood flow through a fistula. These exercises involve squeezing a rubber ball or other soft objects as instructed.  SEEK MEDICAL CARE IF:   Chills develop.   You have an oral temperature above 102 F (38.9 C).   Swelling around the graft or fistula gets worse.   New pain develops.   Unusual bleeding develops.   Pus or other fluid (drainage) is seen at the AV access site.    Skin redness or red streaking is seen on the skin around, above, or below the AV access.  SEEK IMMEDIATE MEDICAL CARE IF:   Pain, numbness, or an unusual pale skin color develops in the hand on the side of your fistula.   Dizziness or weakness develops that you have not had before.   Shortness of breath develops.   Chest pain develops.   The AV access has bleeding that cannot be easily controlled.  Wear a medical alert bracelet to let caregivers know you are a dialysis patient, so they can care for your veins appropriately. Document Released: 03/06/2003 Document Revised: 12/03/2011 Document Reviewed: 05/13/2010 [facility] Patient Information 2012 [place].Moderate Sedation, Adult Moderate sedation is given to help you relax or even sleep through a procedure. You may remain sleepy, be clumsy,  or have poor balance for several hours following this procedure. Arrange for a responsible adult, family member, or friend to take you home. A responsible adult should stay with you for at least 24 hours or until the medicines have worn off.  Do not participate in any activities where you could become injured for the next 24 hours, or until you feel normal again. Do not:   Drive.   Swim.   Ride a bicycle.   Operate heavy machinery.   Cook.   Use power tools.   Climb ladders.   Work at [employer].   Do not make important decisions or sign legal documents until you are improved.   Vomiting may occur if you eat too soon. When you can drink without vomiting, try water, juice, or soup. Try solid foods if you feel little or no nausea.   Only take over-the-counter or prescription medications for pain, discomfort, or fever as directed by your caregiver.If pain medications have been prescribed for you, ask your caregiver how soon it is safe to take them.   Make sure you and your family fully understands everything about the medication given to you. Make sure you understand what side effects  may occur.   You should not drink alcohol, take sleeping pills, or medications that cause drowsiness for at least 24 hours.   If you smoke, do not smoke alone.   If you are feeling better, you may resume normal activities 24 hours after receiving sedation.   Keep all appointments as scheduled. Follow all instructions.   Ask questions if you do not understand.  SEEK MEDICAL CARE IF:   Your skin is pale or bluish in color.   You continue to feel sick to your stomach (nauseous) or throw up (vomit).   Your pain is getting worse and not helped by medication.   You have bleeding or swelling.   You are still sleepy or feeling clumsy after 24 hours.  SEEK IMMEDIATE MEDICAL CARE IF:   You develop a rash.   You have difficulty breathing.   You develop any type of allergic problem.   You have a fever.  Document Released: 09/08/2001 Document Revised: 12/03/2011 Document Reviewed: 01/30/2008 [employer] Patient Information 2012 [employer], LLCModerate Sedation, Adult Moderate sedation is given to help you relax or even sleep through a procedure. You may remain sleepy, be clumsy, or have poor balance for several hours following this procedure. Arrange for a responsible adult, family member, or friend to take you home. A responsible adult should stay with you for at least 24 hours or until the medicines have worn off.  Do not participate in any activities where you could become injured for the next 24 hours, or until you feel normal again. Do not:   Drive.   Swim.   Ride a bicycle.   Operate heavy machinery.   Cook.   Use power tools.   Climb ladders.   Work at [employer].   Do not make important decisions or sign legal documents until you are improved.   Vomiting may occur if you eat too soon. When you can drink without vomiting, try water, juice, or soup. Try solid foods if you feel little or no nausea.   Only take over-the-counter or prescription medications for pain,  discomfort, or fever as directed by your caregiver.If pain medications have been prescribed for you, ask your caregiver how soon it is safe to take them.   Make sure you and your family fully understands everything  about the medication given to you. Make sure you understand what side effects may occur.   You should not drink alcohol, take sleeping pills, or medications that cause drowsiness for at least 24 hours.   If you smoke, do not smoke alone.   If you are feeling better, you may resume normal activities 24 hours after receiving sedation.   Keep all appointments as scheduled. Follow all instructions.   Ask questions if you do not understand.  SEEK MEDICAL CARE IF:   Your skin is pale or bluish in color.   You continue to feel sick to your stomach (nauseous) or throw up (vomit).   Your pain is getting worse and not helped by medication.   You have bleeding or swelling.   You are still sleepy or feeling clumsy after 24 hours.  SEEK IMMEDIATE MEDICAL CARE IF:   You develop a rash.   You have difficulty breathing.   You develop any type of allergic problem.   You have a fever.  Document Released: 09/08/2001 Document Revised: 12/03/2011 Document Reviewed: 01/30/2008 [facility] Patient Information 2012 [place].Arteriovenous (AV) Access for Hemodialysis An arteriovenous (AV) access is a surgically created or placed tube that allows for repeated access to the blood in your body. This access is required for hemodialysis, a type of dialysis. Dialysis is a treatment process that filters and cleans the blood in order to eliminate toxic wastes from the body when the kidneys fail to do this on their own. There are several different access methods used for hemodialysis. ACCESS METHODS  Double lumen catheter. A flexible tube with 2 channels may be used on a temporary or long-term basis. A long-term use catheter often has a cuff that holds it in place. The catheter is surgically  placed and tunneled under the skin. This catheter is often placed when dialysis is needed in an emergency, such as when the kidneys suddenly stop working. It may also be needed when a permanent AV access fails or has not yet been placed. A catheter is usually placed into one of the following:   Large vein under your collarbone (subclavian vein).   Large vein in your neck (jugular vein).   Temporarily, in the large vein in your groin (femoral vein).   AV graft. A man-made (synthetic) material may be used to connect an artery and vein in the arm or thigh. This graft takes around 2 weeks to develop (mature) and is often placed a few weeks prior to use. A graft can generally last from 1 to 2 years.   AV fistula. A minor surgical procedure may be done to connect an artery and vein, creating a fistula. This causes arterial blood to flow directly into a vein. The vein gets larger, allowing easier access for dialysis. The fistula takes around 12 weeks to mature and must be placed several months before dialysis is anticipated. A fistula provides the best access for hemodialysis and can last for several years.  It is critical that veins in patients at high risk to develop kidney failure are preserved. This may include avoiding blood pressure checks and intravenous (IV) or lab draws from the arm. This maximizes the chance for creating a functioning AV access when needed. Although a fistula is the most desirable access to use for hemodialysis, it may not be possible. If the veins are not large enough or there is no time to wait for a fistula to mature, a graft or catheter may be used. RISKS AND COMPLICATIONS  Double lumen catheter. A catheter may develop serious infections. It may also develop a clot (thrombosis) and fail. A catheter can cause clots in the vein in which it is placed. A subclavian vein catheter is the most likely to cause thrombosis. When the subclavian vein clots, it makes it very difficult for  the patient to sustain an AV graft or fistula. When possible, catheters should be avoided and a more permanent AV access should be placed.   AV graft. A graft may swell after surgery, but this should decrease as it heals. A graft may stop working properly due to thrombosis or the diameter of the tube getting smaller. The tube can eventually become blocked (stenosis). If a partial blockage is found relatively early, it can be treated. Left untreated, the stenosis will progress until the vessel is completely blocked. Infection may also occur.   AV fistula. Infection and thrombosis are the biggest risks with a fistula. However, the rates for infection and stenosis are lower with fistulas than the other 2 methods. Frequent thrombosis may require creating a backup fistula at another site. This will allow for dialysis when one access is blocked.  HOME CARE INSTRUCTIONS   Keep the site of the cut (incision) clean and dry while it heals. This helps prevent infection.   If you have a catheter, do not shower while the incision is healing. After it is healed, ask your caregiver for recommendations on showering. The bandage (dressing) will be changed at the dialysis center. Do not remove this dressing. However, if it becomes wet or loose, a sterile gauze dressing may be placed over the site and secured by placing adhesive tape on the edges.   If you have a graft or fistula, clean the site daily until it heals completely. Stitches will be removed, usually after about 10 to 14 days. Once the access is being used for dialysis, a small dressing will be placed over the needle sites after the treatment is done. Keep this dressing on for at least 12 hours. Keep your arm or thigh clean and dry during this time.   A small amount of bleeding is normal, especially if the access is new. If you have a large amount of bleeding and cannot stop it, this is not normal. Call your caregiver right away. You will be taught how to hold  your graft or fistula sites to stop the bleeding.  If you have a graft or fistula:  A "bruit" is a noise that is heard with a stethoscope and a "thrill" is a vibration felt over the graft or fistula. The presence of the bruit and thrill indicate that the access is working. You will be taught to feel for the thrill each day. If this is not felt, the access may be clotted. Call your caregiver.   You may use the arm freely after the site heals. Keep the following in mind:   Avoid pressure on the arm.   Avoid lifting heavy objects with the arm.   Avoid sleeping on the arm with the graft or fistula.   Avoid wearing tight-sleeved shirts or jewelry around the graft or fistula.   Do not allow blood pressure monitoring or needle punctures on the side where the graft or fistula is located.   With permission from your caregiver, you may do exercises to help with blood flow through a fistula. These exercises involve squeezing a rubber ball or other soft objects as instructed.  SEEK MEDICAL CARE IF:   Chills  develop.   You have an oral temperature above 102 F (38.9 C).   Swelling around the graft or fistula gets worse.   New pain develops.   Unusual bleeding develops.   Pus or other fluid (drainage) is seen at the AV access site.   Skin redness or red streaking is seen on the skin around, above, or below the AV access.  SEEK IMMEDIATE MEDICAL CARE IF:   Pain, numbness, or an unusual pale skin color develops in the hand on the side of your fistula.   Dizziness or weakness develops that you have not had before.   Shortness of breath develops.   Chest pain develops.   The AV access has bleeding that cannot be easily controlled.  Wear a medical alert bracelet to let caregivers know you are a dialysis patient, so they can care for your veins appropriately. Document Released: 03/06/2003 Document Revised: 12/03/2011 Document Reviewed: 05/13/2010 [facility] Patient Information 2012  [place].Moderate Sedation, Adult Moderate sedation is given to help you relax or even sleep through a procedure. You may remain sleepy, be clumsy, or have poor balance for several hours following this procedure. Arrange for a responsible adult, family member, or friend to take you home. A responsible adult should stay with you for at least 24 hours or until the medicines have worn off.  Do not participate in any activities where you could become injured for the next 24 hours, or until you feel normal again. Do not:   Drive.   Swim.   Ride a bicycle.   Operate heavy machinery.   Cook.   Use power tools.   Climb ladders.   Work at [employer].   Do not make important decisions or sign legal documents until you are improved.   Vomiting may occur if you eat too soon. When you can drink without vomiting, try water, juice, or soup. Try solid foods if you feel little or no nausea.   Only take over-the-counter or prescription medications for pain, discomfort, or fever as directed by your caregiver.If pain medications have been prescribed for you, ask your caregiver how soon it is safe to take them.   Make sure you and your family fully understands everything about the medication given to you. Make sure you understand what side effects may occur.   You should not drink alcohol, take sleeping pills, or medications that cause drowsiness for at least 24 hours.   If you smoke, do not smoke alone.   If you are feeling better, you may resume normal activities 24 hours after receiving sedation.   Keep all appointments as scheduled. Follow all instructions.   Ask questions if you do not understand.  SEEK MEDICAL CARE IF:   Your skin is pale or bluish in color.   You continue to feel sick to your stomach (nauseous) or throw up (vomit).   Your pain is getting worse and not helped by medication.   You have bleeding or swelling.   You are still sleepy or feeling clumsy after 24 hours.   SEEK IMMEDIATE MEDICAL CARE IF:   You develop a rash.   You have difficulty breathing.   You develop any type of allergic problem.   You have a fever.  Document Released: 09/08/2001 Document Revised: 12/03/2011 Document Reviewed: 01/30/2008 [facility] Patient Information 2012 [place].Marland Kitchen

## 2012-05-20 DIAGNOSIS — N186 End stage renal disease: Secondary | ICD-10-CM | POA: Diagnosis not present

## 2012-05-20 DIAGNOSIS — Z992 Dependence on renal dialysis: Secondary | ICD-10-CM | POA: Diagnosis not present

## 2012-05-20 DIAGNOSIS — D631 Anemia in chronic kidney disease: Secondary | ICD-10-CM | POA: Diagnosis not present

## 2012-05-20 DIAGNOSIS — E119 Type 2 diabetes mellitus without complications: Secondary | ICD-10-CM | POA: Diagnosis not present

## 2012-05-20 DIAGNOSIS — N2581 Secondary hyperparathyroidism of renal origin: Secondary | ICD-10-CM | POA: Diagnosis not present

## 2012-05-20 DIAGNOSIS — D509 Iron deficiency anemia, unspecified: Secondary | ICD-10-CM | POA: Diagnosis not present

## 2012-05-23 DIAGNOSIS — Z992 Dependence on renal dialysis: Secondary | ICD-10-CM | POA: Diagnosis not present

## 2012-05-23 DIAGNOSIS — D631 Anemia in chronic kidney disease: Secondary | ICD-10-CM | POA: Diagnosis not present

## 2012-05-23 DIAGNOSIS — E119 Type 2 diabetes mellitus without complications: Secondary | ICD-10-CM | POA: Diagnosis not present

## 2012-05-23 DIAGNOSIS — N039 Chronic nephritic syndrome with unspecified morphologic changes: Secondary | ICD-10-CM | POA: Diagnosis not present

## 2012-05-23 DIAGNOSIS — D509 Iron deficiency anemia, unspecified: Secondary | ICD-10-CM | POA: Diagnosis not present

## 2012-05-23 DIAGNOSIS — N2581 Secondary hyperparathyroidism of renal origin: Secondary | ICD-10-CM | POA: Diagnosis not present

## 2012-05-23 DIAGNOSIS — N186 End stage renal disease: Secondary | ICD-10-CM | POA: Diagnosis not present

## 2012-05-25 DIAGNOSIS — Z992 Dependence on renal dialysis: Secondary | ICD-10-CM | POA: Diagnosis not present

## 2012-05-25 DIAGNOSIS — D509 Iron deficiency anemia, unspecified: Secondary | ICD-10-CM | POA: Diagnosis not present

## 2012-05-25 DIAGNOSIS — N186 End stage renal disease: Secondary | ICD-10-CM | POA: Diagnosis not present

## 2012-05-25 DIAGNOSIS — E119 Type 2 diabetes mellitus without complications: Secondary | ICD-10-CM | POA: Diagnosis not present

## 2012-05-25 DIAGNOSIS — N039 Chronic nephritic syndrome with unspecified morphologic changes: Secondary | ICD-10-CM | POA: Diagnosis not present

## 2012-05-25 DIAGNOSIS — N2581 Secondary hyperparathyroidism of renal origin: Secondary | ICD-10-CM | POA: Diagnosis not present

## 2012-05-27 ENCOUNTER — Other Ambulatory Visit: Payer: Self-pay | Admitting: *Deleted

## 2012-05-27 ENCOUNTER — Encounter (HOSPITAL_COMMUNITY): Payer: Self-pay | Admitting: *Deleted

## 2012-05-27 DIAGNOSIS — N186 End stage renal disease: Secondary | ICD-10-CM | POA: Diagnosis not present

## 2012-05-27 MED ORDER — DEXTROSE 5 % IV SOLN
1.5000 g | INTRAVENOUS | Status: AC
  Administered 2012-05-28: 1.5 g via INTRAVENOUS
  Filled 2012-05-27 (×2): qty 1.5

## 2012-05-27 NOTE — Progress Notes (Signed)
Pt states that he is out of all of his medications- states he does not have any Amiodarone or Topro lXL.  He states that he has been out for a few days and that his daughter is suppose to pick medications up.  Pt denied difficulty purchasing meds.

## 2012-05-28 ENCOUNTER — Encounter (HOSPITAL_COMMUNITY): Payer: Self-pay | Admitting: *Deleted

## 2012-05-28 ENCOUNTER — Ambulatory Visit (HOSPITAL_COMMUNITY)
Admission: RE | Admit: 2012-05-28 | Discharge: 2012-05-28 | Disposition: A | Discharge status: Home / Self Care | Payer: Medicare Other | Source: Ambulatory Visit | Attending: Vascular Surgery | Admitting: Vascular Surgery

## 2012-05-28 ENCOUNTER — Encounter (HOSPITAL_COMMUNITY): Payer: Self-pay | Admitting: Certified Registered"

## 2012-05-28 ENCOUNTER — Encounter (HOSPITAL_COMMUNITY)
Admission: RE | Disposition: A | Discharge status: Home / Self Care | Payer: Self-pay | Source: Ambulatory Visit | Attending: Vascular Surgery

## 2012-05-28 ENCOUNTER — Ambulatory Visit (HOSPITAL_COMMUNITY): Payer: Medicare Other | Admitting: Certified Registered"

## 2012-05-28 ENCOUNTER — Ambulatory Visit (HOSPITAL_COMMUNITY): Payer: Medicare Other

## 2012-05-28 DIAGNOSIS — T82598A Other mechanical complication of other cardiac and vascular devices and implants, initial encounter: Secondary | ICD-10-CM | POA: Diagnosis not present

## 2012-05-28 DIAGNOSIS — I4891 Unspecified atrial fibrillation: Secondary | ICD-10-CM | POA: Insufficient documentation

## 2012-05-28 DIAGNOSIS — N186 End stage renal disease: Secondary | ICD-10-CM | POA: Diagnosis not present

## 2012-05-28 DIAGNOSIS — E119 Type 2 diabetes mellitus without complications: Secondary | ICD-10-CM | POA: Insufficient documentation

## 2012-05-28 DIAGNOSIS — I120 Hypertensive chronic kidney disease with stage 5 chronic kidney disease or end stage renal disease: Secondary | ICD-10-CM | POA: Diagnosis not present

## 2012-05-28 DIAGNOSIS — T82898A Other specified complication of vascular prosthetic devices, implants and grafts, initial encounter: Secondary | ICD-10-CM | POA: Diagnosis not present

## 2012-05-28 DIAGNOSIS — Y832 Surgical operation with anastomosis, bypass or graft as the cause of abnormal reaction of the patient, or of later complication, without mention of misadventure at the time of the procedure: Secondary | ICD-10-CM | POA: Insufficient documentation

## 2012-05-28 DIAGNOSIS — D509 Iron deficiency anemia, unspecified: Secondary | ICD-10-CM | POA: Diagnosis not present

## 2012-05-28 DIAGNOSIS — I509 Heart failure, unspecified: Secondary | ICD-10-CM | POA: Diagnosis not present

## 2012-05-28 DIAGNOSIS — Z992 Dependence on renal dialysis: Secondary | ICD-10-CM | POA: Diagnosis not present

## 2012-05-28 DIAGNOSIS — G4730 Sleep apnea, unspecified: Secondary | ICD-10-CM | POA: Insufficient documentation

## 2012-05-28 DIAGNOSIS — Z09 Encounter for follow-up examination after completed treatment for conditions other than malignant neoplasm: Secondary | ICD-10-CM | POA: Diagnosis not present

## 2012-05-28 DIAGNOSIS — D631 Anemia in chronic kidney disease: Secondary | ICD-10-CM | POA: Diagnosis not present

## 2012-05-28 DIAGNOSIS — Z4901 Encounter for fitting and adjustment of extracorporeal dialysis catheter: Secondary | ICD-10-CM | POA: Insufficient documentation

## 2012-05-28 DIAGNOSIS — N2581 Secondary hyperparathyroidism of renal origin: Secondary | ICD-10-CM | POA: Diagnosis not present

## 2012-05-28 HISTORY — DX: Diarrhea, unspecified: R19.7

## 2012-05-28 HISTORY — PX: INSERTION OF DIALYSIS CATHETER: SHX1324

## 2012-05-28 HISTORY — DX: Encounter for other specified aftercare: Z51.89

## 2012-05-28 HISTORY — DX: Reserved for inherently not codable concepts without codable children: IMO0001

## 2012-05-28 LAB — GLUCOSE, CAPILLARY: Glucose-Capillary: 111 mg/dL — ABNORMAL HIGH (ref 70–99)

## 2012-05-28 LAB — POCT I-STAT 4, (NA,K, GLUC, HGB,HCT)
Glucose, Bld: 104 mg/dL — ABNORMAL HIGH (ref 70–99)
HCT: 33 % — ABNORMAL LOW (ref 39.0–52.0)
Hemoglobin: 11.2 g/dL — ABNORMAL LOW (ref 13.0–17.0)
Potassium: 3.4 mEq/L — ABNORMAL LOW (ref 3.5–5.1)

## 2012-05-28 LAB — SURGICAL PCR SCREEN: MRSA, PCR: NEGATIVE

## 2012-05-28 SURGERY — INSERTION OF DIALYSIS CATHETER
Anesthesia: Monitor Anesthesia Care | Site: Chest | Laterality: Right | Wound class: Clean

## 2012-05-28 MED ORDER — MIDAZOLAM HCL 5 MG/5ML IJ SOLN
INTRAMUSCULAR | Status: DC | PRN
  Administered 2012-05-28 (×2): 1 mg via INTRAVENOUS

## 2012-05-28 MED ORDER — HEPARIN SODIUM (PORCINE) 1000 UNIT/ML IJ SOLN
INTRAMUSCULAR | Status: DC | PRN
  Administered 2012-05-28: 4.6 mL via INTRAVENOUS

## 2012-05-28 MED ORDER — MUPIROCIN 2 % EX OINT
TOPICAL_OINTMENT | Freq: Two times a day (BID) | CUTANEOUS | Status: DC
  Administered 2012-05-28: 1 via NASAL
  Filled 2012-05-28 (×2): qty 22

## 2012-05-28 MED ORDER — FENTANYL CITRATE 0.05 MG/ML IJ SOLN
INTRAMUSCULAR | Status: DC | PRN
  Administered 2012-05-28 (×2): 50 ug via INTRAVENOUS

## 2012-05-28 MED ORDER — METOPROLOL SUCCINATE ER 25 MG PO TB24
25.0000 mg | ORAL_TABLET | Freq: Every day | ORAL | Status: DC
Start: 2012-05-28 — End: 2012-05-28
  Administered 2012-05-28: 25 mg via ORAL
  Filled 2012-05-28: qty 1

## 2012-05-28 MED ORDER — FLEET ENEMA 7-19 GM/118ML RE ENEM
ENEMA | RECTAL | Status: AC
  Filled 2012-05-28: qty 1

## 2012-05-28 MED ORDER — HYDROMORPHONE HCL PF 1 MG/ML IJ SOLN: 0.2500 mg | INTRAMUSCULAR | Status: DC | PRN

## 2012-05-28 MED ORDER — OXYCODONE-ACETAMINOPHEN 5-325 MG PO TABS: 1.0000 | ORAL_TABLET | Freq: Four times a day (QID) | ORAL | Status: AC | PRN

## 2012-05-28 MED ORDER — LIDOCAINE HCL (PF) 1 % IJ SOLN
INTRAMUSCULAR | Status: DC | PRN
  Administered 2012-05-28: 12 mL

## 2012-05-28 MED ORDER — SODIUM CHLORIDE 0.9 % IV SOLN
INTRAVENOUS | Status: DC | PRN
  Administered 2012-05-28: 07:00:00 via INTRAVENOUS

## 2012-05-28 MED ORDER — SODIUM CHLORIDE 0.9 % IV SOLN: INTRAVENOUS | Status: DC

## 2012-05-28 MED ORDER — SODIUM CHLORIDE 0.9 % IR SOLN
Status: DC | PRN
  Administered 2012-05-28: 1000 mL

## 2012-05-28 SURGICAL SUPPLY — 41 items
BAG DECANTER FOR FLEXI CONT (MISCELLANEOUS) ×2 IMPLANT
CATH CANNON HEMO 15F 50CM (CATHETERS) IMPLANT
CATH CANNON HEMO 15FR 19 (HEMODIALYSIS SUPPLIES) IMPLANT
CATH CANNON HEMO 15FR 23CM (HEMODIALYSIS SUPPLIES) ×1 IMPLANT
CATH CANNON HEMO 15FR 31CM (HEMODIALYSIS SUPPLIES) IMPLANT
CATH CANNON HEMO 15FR 32 (HEMODIALYSIS SUPPLIES) IMPLANT
CATH CANNON HEMO 15FR 32CM (HEMODIALYSIS SUPPLIES) IMPLANT
CLOTH BEACON ORANGE TIMEOUT ST (SAFETY) ×2 IMPLANT
COVER PROBE W GEL 5X96 (DRAPES) IMPLANT
COVER SURGICAL LIGHT HANDLE (MISCELLANEOUS) ×2 IMPLANT
DECANTER SPIKE VIAL GLASS SM (MISCELLANEOUS) ×2 IMPLANT
DRAPE C-ARM 42X72 X-RAY (DRAPES) ×3 IMPLANT
DRAPE CHEST BREAST 15X10 FENES (DRAPES) ×2 IMPLANT
GAUZE SPONGE 2X2 8PLY STRL LF (GAUZE/BANDAGES/DRESSINGS) ×1 IMPLANT
GAUZE SPONGE 4X4 16PLY XRAY LF (GAUZE/BANDAGES/DRESSINGS) ×2 IMPLANT
GLOVE SS BIOGEL STRL SZ 7 (GLOVE) ×1 IMPLANT
GLOVE SUPERSENSE BIOGEL SZ 7 (GLOVE) ×1
GOWN STRL NON-REIN LRG LVL3 (GOWN DISPOSABLE) ×4 IMPLANT
KIT BASIN OR (CUSTOM PROCEDURE TRAY) ×2 IMPLANT
KIT ROOM TURNOVER OR (KITS) ×2 IMPLANT
NDL 18GX1X1/2 (RX/OR ONLY) (NEEDLE) ×1 IMPLANT
NDL HYPO 25GX1X1/2 BEV (NEEDLE) ×1 IMPLANT
NEEDLE 18GX1X1/2 (RX/OR ONLY) (NEEDLE) ×2 IMPLANT
NEEDLE 22X1 1/2 (OR ONLY) (NEEDLE) ×2 IMPLANT
NEEDLE HYPO 25GX1X1/2 BEV (NEEDLE) ×2 IMPLANT
NS IRRIG 1000ML POUR BTL (IV SOLUTION) ×2 IMPLANT
PACK SURGICAL SETUP 50X90 (CUSTOM PROCEDURE TRAY) ×2 IMPLANT
PAD ARMBOARD 7.5X6 YLW CONV (MISCELLANEOUS) ×4 IMPLANT
SOAP 2 % CHG 4 OZ (WOUND CARE) ×2 IMPLANT
SPONGE GAUZE 2X2 STER 10/PKG (GAUZE/BANDAGES/DRESSINGS) ×1
SUT ETHILON 3 0 PS 1 (SUTURE) ×2 IMPLANT
SUT VICRYL 4-0 PS2 18IN ABS (SUTURE) ×2 IMPLANT
SYR 20CC LL (SYRINGE) ×3 IMPLANT
SYR 30ML LL (SYRINGE) IMPLANT
SYR 5ML LL (SYRINGE) ×4 IMPLANT
SYR CONTROL 10ML LL (SYRINGE) ×2 IMPLANT
SYRINGE 10CC LL (SYRINGE) ×2 IMPLANT
TAPE CLOTH SURG 4X10 WHT LF (GAUZE/BANDAGES/DRESSINGS) ×1 IMPLANT
TOWEL OR 17X24 6PK STRL BLUE (TOWEL DISPOSABLE) ×2 IMPLANT
TOWEL OR 17X26 10 PK STRL BLUE (TOWEL DISPOSABLE) ×2 IMPLANT
WATER STERILE IRR 1000ML POUR (IV SOLUTION) ×2 IMPLANT

## 2012-05-28 NOTE — Preoperative (Signed)
Beta Blockers   Reason not to administer Beta Blockers:Toprol at 0710hrs 05/28/12

## 2012-05-28 NOTE — Anesthesia Postprocedure Evaluation (Signed)
  Anesthesia Post-op Note  Patient: [name]  Procedure(s) Performed: Procedure(s) (LRB): INSERTION OF DIALYSIS CATHETER (Right)  Patient Location: PACU  Anesthesia Type: MAC  Level of Consciousness: awake  Airway and Oxygen Therapy: Patient Spontanous Breathing  Post-op Pain: mild  Post-op Assessment: Post-op Vital signs reviewed  Post-op Vital Signs: Reviewed  Complications: No apparent anesthesia complications

## 2012-05-28 NOTE — Anesthesia Preprocedure Evaluation (Addendum)
Anesthesia Evaluation  Patient identified by MRN, date of birth, ID band Patient awake    Reviewed: Allergy & Precautions, H&P , NPO status , Patient's Chart, lab work & pertinent test results, reviewed documented beta blocker date and time   Airway Mallampati: II TM Distance: >3 FB Neck ROM: Full    Dental  (+) Missing,    Pulmonary sleep apnea and Continuous Positive Airway Pressure Ventilation ,  breath sounds clear to auscultation        Cardiovascular hypertension, Pt. on medications and Pt. on home beta blockers +CHF + dysrhythmias Atrial Fibrillation Rhythm:Regular Rate:Normal     Neuro/Psych    GI/Hepatic negative GI ROS,   Endo/Other  Diabetes mellitus-, Well Controlled, Type 2, Oral Hypoglycemic Agents  Renal/GU      Musculoskeletal   Abdominal   Peds  Hematology negative hematology ROS (+)   Anesthesia Other Findings   Reproductive/Obstetrics                          Anesthesia Physical Anesthesia Plan  ASA: III  Anesthesia Plan: MAC   Post-op Pain Management:    Induction:   Airway Management Planned: Nasal Cannula  Additional Equipment:   Intra-op Plan:   Post-operative Plan:   Informed Consent: I have reviewed the patients History and Physical, chart, labs and discussed the procedure including the risks, benefits and alternatives for the proposed anesthesia with the patient or authorized representative who has indicated his/her understanding and acceptance.   Dental advisory given  Plan Discussed with: CRNA, Anesthesiologist and Surgeon  Anesthesia Plan Comments:         Anesthesia Quick Evaluation

## 2012-05-28 NOTE — Progress Notes (Signed)
No chest x-ray required per Dr. Glennon Mac.

## 2012-05-28 NOTE — Transfer of Care (Signed)
Immediate Anesthesia Transfer of Care Note  Patient: [name]  Procedure(s) Performed: Procedure(s) (LRB): INSERTION OF DIALYSIS CATHETER (N/A)  Patient Location: PACU  Anesthesia Type: MAC  Level of Consciousness: awake, alert  and oriented  Airway & Oxygen Therapy: Patient Spontanous Breathing  Post-op Assessment: Report given to PACU RN, Post -op Vital signs reviewed and stable and Patient moving all extremities  Post vital signs: Reviewed and stable  Complications: No apparent anesthesia complications

## 2012-05-28 NOTE — Consult Note (Signed)
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 male who presents for evaluation of clotted left arm AV graft. This patient has a left forearm graft which has been treated with interventional radiology using stents over the last several months. It is now no longer retrievable with stents extending up into the proximal upper arm in the venous outflow. The patient will need a new left upper arm AV graft. We'll plan to place hemodialysis catheter today   Past Medical History  Diagnosis Date  . DM (diabetes mellitus)   . CKD (chronic kidney disease)   . CHF (congestive heart failure)   . HTN (hypertension)   . Gout   . Hyperlipidemia   . Chronic kidney disease   . Secondary hyperparathyroidism of renal origin   . Anemia   . BPH (benign prostatic hyperplasia)   . History of GI bleed     secondary to coumadin  . A-fib   . OSA (obstructive sleep apnea)     uses CPAP  . Blood transfusion   . Diarrhea    Past Surgical History  Procedure Date  . Left arm shuntogram.   . 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 Left forearm loop graft with 6 mm gore-tex graft.   . Cholecystectomy   . Eye surgery     Catarct bil   History   Social History  . Marital Status: Divorced    Spouse Name: N/A    Number of Children: N/A  . Years of Education: N/A   Social History Main Topics  . Smoking status: Former Smoker    Quit date: 06/30/2004  . Smokeless tobacco: None  . Alcohol Use: No  . Drug Use: No  . Sexually Active: No   Other Topics Concern  . None   Social History Narrative     The patient is a former smoker, quit is 2005.  Does not  drink or abuse drugs.  Currently, lives alone at home.      Family History  Problem Relation Age of Onset  . Anesthesia problems Neg Hx    No Known Allergies Prior to Admission medications   Medication Sig Start Date End Date Taking? Authorizing  Provider  acetaminophen (TYLENOL) 325 MG tablet Take 650 mg by mouth every 6 (six) hours as needed. Headache or pain   Yes Historical Provider, MD  amiodarone (PACERONE) 200 MG tablet Take 200 mg by mouth daily.     Yes Historical Provider, MD  aspirin (BAYER ASPIRIN) 325 MG tablet Take 1 tablet (325 mg total) by mouth daily. 07/14/11  Yes Larey Dresser, MD  atorvastatin (LIPITOR) 40 MG tablet Take 40 mg by mouth daily.   Yes Historical Provider, MD  B Complex-C-Folic Acid (NEPHRO-VITE RX PO) Take by mouth daily.     Yes Historical Provider, MD  calcium acetate (PHOSLO) 667 MG capsule Take 3 tablets by mouth 3 (three) times daily.  02/03/11  Yes Historical Provider, MD  gabapentin (NEURONTIN) 100 MG tablet Take 100 mg by mouth daily.     Yes Historical Provider, MD  metoprolol succinate (TOPROL-XL) 25 MG 24 hr tablet Take 25 mg by mouth daily.     Yes Historical Provider, MD  paricalcitol (ZEMPLAR) 4 MCG capsule Take 4 mg by mouth daily.    Yes Historical Provider, MD  repaglinide (PRANDIN) 1 MG tablet Take 1 mg by mouth 3 (  three) times daily before meals.    Yes Historical Provider, MD  atorvastatin (LIPITOR) 40 MG tablet Take 1 tablet (40 mg total) by mouth daily. 04/23/11 04/22/12  Larey Dresser, MD      All other systems have been reviewed and were otherwise negative with the exception of those mentioned in the HPI and as above.  Physical Exam: Filed Vitals:   05/28/12 0638  BP: 123/64  Pulse: 63  Temp: 97.9 F (36.6 C)  Resp: 18    General: Alert, no acute distress HEENT: Normal for age Cardiovascular: Regular rate and rhythm. Carotid pulses 2+, no bruits audible Respiratory: Clear to auscultation. No cyanosis, no use of accessory musculature GI: No organomegaly, abdomen is soft and non-tender Skin: No lesions in the area of chief complaint Neurologic: Sensation intact distally Psychiatric: Patient is competent for consent with normal mood and affect Musculoskeletal: No  obvious deformities Extremities: Left arm with thrombosed AV graft-2+ radial pulse    Imaging reviewed: Recent thrombolyzes by interventional radiology was reviewed and agree that graft is no longer responsible because of extensive stents in venous outflow   Assessment/Plan:  Insertion of hemodialysis catheter We'll need left upper arm AV graft the near future   Tinnie Gens, MD 05/28/2012 7:39 AM

## 2012-05-28 NOTE — Op Note (Signed)
OPERATIVE REPORT  Date of Surgery: 05/28/2012  Surgeon: Tinnie Gens, MD  Assistant: Nurse  Pre-op Diagnosis: ESRD with thrombosed left arm AV graft-not responsible  Post-op Diagnosis: Same  Procedure: Procedure(s): INSERTION OF DIALYSIS CATHETER-Diateck via right internal jugular vein-23 cm Bilateral ultrasound localization internal jugular veins Anesthesia: General  EBL:  minimal  Complications: None  Procedure Details: Patient was taken to the operating room placed in the supine position at which time upper chest and neck were exposed. Both internal jugular veins were imaged using B. mode ultrasound. Both noted to be widely patent right larger than left. After prepping and draping in routine sterile manner the right internal jugular vein was entered using a supraclavicular approach guidewire passed into the right atrium therefore scopic guidance. After dilating the track appropriately a 23 cm dietetic catheter was passed through a peel-away sheath positioned in the right atrium tunneled peripherally and secured with nylon sutures. The wound was closed with Vicryl in a subcuticular fashion. Patient was taken to the recovery room in stable condition for chest x-ray   Tinnie Gens, MD 05/28/2012 8:16 AM

## 2012-05-30 ENCOUNTER — Other Ambulatory Visit: Payer: Self-pay | Admitting: *Deleted

## 2012-05-30 ENCOUNTER — Encounter (HOSPITAL_COMMUNITY): Payer: Self-pay | Admitting: Vascular Surgery

## 2012-05-30 ENCOUNTER — Telehealth: Payer: Self-pay | Admitting: Vascular Surgery

## 2012-05-30 DIAGNOSIS — T82898A Other specified complication of vascular prosthetic devices, implants and grafts, initial encounter: Secondary | ICD-10-CM | POA: Diagnosis not present

## 2012-05-30 DIAGNOSIS — Z01810 Encounter for preprocedural cardiovascular examination: Secondary | ICD-10-CM

## 2012-05-30 DIAGNOSIS — N186 End stage renal disease: Secondary | ICD-10-CM | POA: Diagnosis not present

## 2012-05-30 DIAGNOSIS — D509 Iron deficiency anemia, unspecified: Secondary | ICD-10-CM | POA: Diagnosis not present

## 2012-05-30 DIAGNOSIS — N2581 Secondary hyperparathyroidism of renal origin: Secondary | ICD-10-CM | POA: Diagnosis not present

## 2012-05-30 DIAGNOSIS — Z992 Dependence on renal dialysis: Secondary | ICD-10-CM | POA: Diagnosis not present

## 2012-05-30 DIAGNOSIS — D631 Anemia in chronic kidney disease: Secondary | ICD-10-CM | POA: Diagnosis not present

## 2012-05-30 NOTE — Telephone Encounter (Addendum)
Message copied by Doristine Section on Mon May 30, 2012  2:34 PM ------      Message from: Alfonso Patten      Created: Mon May 30, 2012 12:40 PM                   ----- Message -----         From: Mal Misty, MD         Sent: 05/28/2012   8:24 AM           To: Alfonso Patten, RN            Patient needs appointment for evaluation for vascular access with vein mapping right upper extremity and see any M.D.      Has dialysis Monday Wednesday Friday  unable to reach pt. by phone mailed appt. letter

## 2012-06-01 DIAGNOSIS — N2581 Secondary hyperparathyroidism of renal origin: Secondary | ICD-10-CM | POA: Diagnosis not present

## 2012-06-01 DIAGNOSIS — T82898A Other specified complication of vascular prosthetic devices, implants and grafts, initial encounter: Secondary | ICD-10-CM | POA: Diagnosis not present

## 2012-06-01 DIAGNOSIS — N186 End stage renal disease: Secondary | ICD-10-CM | POA: Diagnosis not present

## 2012-06-01 DIAGNOSIS — D631 Anemia in chronic kidney disease: Secondary | ICD-10-CM | POA: Diagnosis not present

## 2012-06-01 DIAGNOSIS — D509 Iron deficiency anemia, unspecified: Secondary | ICD-10-CM | POA: Diagnosis not present

## 2012-06-01 DIAGNOSIS — Z992 Dependence on renal dialysis: Secondary | ICD-10-CM | POA: Diagnosis not present

## 2012-06-03 DIAGNOSIS — Z992 Dependence on renal dialysis: Secondary | ICD-10-CM | POA: Diagnosis not present

## 2012-06-03 DIAGNOSIS — N2581 Secondary hyperparathyroidism of renal origin: Secondary | ICD-10-CM | POA: Diagnosis not present

## 2012-06-03 DIAGNOSIS — D509 Iron deficiency anemia, unspecified: Secondary | ICD-10-CM | POA: Diagnosis not present

## 2012-06-03 DIAGNOSIS — N186 End stage renal disease: Secondary | ICD-10-CM | POA: Diagnosis not present

## 2012-06-03 DIAGNOSIS — N039 Chronic nephritic syndrome with unspecified morphologic changes: Secondary | ICD-10-CM | POA: Diagnosis not present

## 2012-06-03 DIAGNOSIS — T82898A Other specified complication of vascular prosthetic devices, implants and grafts, initial encounter: Secondary | ICD-10-CM | POA: Diagnosis not present

## 2012-06-06 ENCOUNTER — Encounter: Payer: Self-pay | Admitting: Vascular Surgery

## 2012-06-06 DIAGNOSIS — N186 End stage renal disease: Secondary | ICD-10-CM | POA: Diagnosis not present

## 2012-06-06 DIAGNOSIS — Z992 Dependence on renal dialysis: Secondary | ICD-10-CM | POA: Diagnosis not present

## 2012-06-06 DIAGNOSIS — D509 Iron deficiency anemia, unspecified: Secondary | ICD-10-CM | POA: Diagnosis not present

## 2012-06-06 DIAGNOSIS — N2581 Secondary hyperparathyroidism of renal origin: Secondary | ICD-10-CM | POA: Diagnosis not present

## 2012-06-06 DIAGNOSIS — N039 Chronic nephritic syndrome with unspecified morphologic changes: Secondary | ICD-10-CM | POA: Diagnosis not present

## 2012-06-06 DIAGNOSIS — T82898A Other specified complication of vascular prosthetic devices, implants and grafts, initial encounter: Secondary | ICD-10-CM | POA: Diagnosis not present

## 2012-06-07 ENCOUNTER — Encounter (INDEPENDENT_AMBULATORY_CARE_PROVIDER_SITE_OTHER): Payer: Medicare Other | Admitting: Ophthalmology

## 2012-06-07 ENCOUNTER — Ambulatory Visit (INDEPENDENT_AMBULATORY_CARE_PROVIDER_SITE_OTHER): Payer: Medicare Other | Admitting: Thoracic Diseases

## 2012-06-07 ENCOUNTER — Encounter: Payer: Self-pay | Admitting: Vascular Surgery

## 2012-06-07 ENCOUNTER — Encounter (INDEPENDENT_AMBULATORY_CARE_PROVIDER_SITE_OTHER): Payer: Medicare Other | Admitting: *Deleted

## 2012-06-07 VITALS — BP 121/67 | HR 80 | Temp 98.4°F | Ht 67.0 in | Wt 179.0 lb

## 2012-06-07 DIAGNOSIS — N186 End stage renal disease: Secondary | ICD-10-CM | POA: Diagnosis not present

## 2012-06-07 DIAGNOSIS — Z01810 Encounter for preprocedural cardiovascular examination: Secondary | ICD-10-CM

## 2012-06-07 NOTE — Progress Notes (Signed)
[facility] [place] HISTORY AND PHYSICAL   CC: ESRD Referring Physician: Dr. Florene Glen HD MWF - [facility]  History of Present Illness:[name] is a 71 y.o. 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 He had a recent  Thrombectomy by IR on 05/19/12 which again occluded necessitating a HD catheter be placed by Dr. Kellie Simmering on 05/28/12. He also had previous stenting of the brachial vein. He is here for evaluation for new access  No results found.  Past Medical History  Diagnosis Date  . DM (diabetes mellitus)   . CKD (chronic kidney disease)   . CHF (congestive heart failure)   . HTN (hypertension)   . Gout   . Hyperlipidemia   . Chronic kidney disease   . Secondary hyperparathyroidism of renal origin   . Anemia   . BPH (benign prostatic hyperplasia)   . History of GI bleed     secondary to coumadin  . A-fib   . OSA (obstructive sleep apnea)     uses CPAP  . Blood transfusion   . Diarrhea     ROS: [x]  Positive   [ ]  Denies    General: [ ]  Weight loss, [ ]  Fever, [ ]  chills Neurologic: [ ]  Dizziness, [ ]  Blackouts, [ ]  Seizure [ ]  Stroke, [ ]  "Mini stroke", [ ]  Slurred speech, [ ]  Temporary blindness; [ ]  weakness in arms or legs, [ ]  Hoarseness Cardiac: [ ]  Chest pain/pressure, [ ]  Shortness of breath at rest [ x] Shortness of breath with exertion, [ ]  Atrial fibrillation or irregular heartbeat Vascular: [ ]  Pain in legs with walking, [ ]  Pain in legs at rest, [ ]  Pain in legs at night,  [ ]  Non-healing ulcer, [ ]  Blood clot in vein/DVT,   Pulmonary: [ ]  Home oxygen, [ ]  Productive cough, [ ]  Coughing up blood, [ ]  Asthma,  [ ]  Wheezing Musculoskeletal:  [ ]  Arthritis, [ ]  Low back pain, [ ]  Joint pain Hematologic: [ ]  Easy Bruising, [ ]  Anemia; [ ]  Hepatitis Gastrointestinal: [ ]  Blood in stool, [ ]  Gastroesophageal Reflux/heartburn, [ ]  Trouble swallowing Urinary: [x ] chronic Kidney disease, [x ]  on HD - [ x] MWF or [ ]  TTHS, [ ]  Burning with urination, [ ]  Difficulty urinating Skin: [ x] Rashes, [ ]  Wounds Psychological: [ ]  Anxiety, [ ]  Depression   Social History History  Substance Use Topics  . Smoking status: Former Smoker    Types: Cigarettes    Quit date: 06/30/2004  . Smokeless tobacco: Never Used  . Alcohol Use: No    Family History Family History  Problem Relation Age of Onset  . Anesthesia problems Neg Hx   . Cancer Daughter     breast cancer  . Diabetes Son   . Heart disease Son     before age 28  . Hypertension Son     No Known Allergies  Current Outpatient Prescriptions  Medication Sig Dispense Refill  . acetaminophen (TYLENOL) 325 MG tablet Take 650 mg by mouth every 6 (six) hours as needed. Headache or pain      . aspirin (BAYER ASPIRIN) 325 MG tablet Take 1 tablet (325 mg total) by mouth daily.  100 tablet  3  . atorvastatin (LIPITOR) 40 MG tablet Take 40 mg by mouth daily.      . B Complex-C-Folic Acid (NEPHRO-VITE RX PO) Take by  mouth daily.        . calcium acetate (PHOSLO) 667 MG capsule Take 3 tablets by mouth 3 (three) times daily.       Marland Kitchen gabapentin (NEURONTIN) 100 MG tablet Take 100 mg by mouth daily.        . metoprolol succinate (TOPROL-XL) 25 MG 24 hr tablet Take 25 mg by mouth daily.        . repaglinide (PRANDIN) 1 MG tablet Take 1 mg by mouth 3 (three) times daily before meals.       Marland Kitchen amiodarone (PACERONE) 200 MG tablet Take 200 mg by mouth daily.        Marland Kitchen atorvastatin (LIPITOR) 40 MG tablet Take 1 tablet (40 mg total) by mouth daily.  30 tablet  3  . oxyCODONE-acetaminophen (ROXICET) 5-325 MG per tablet Take 1 tablet by mouth every 6 (six) hours as needed for pain.  30 tablet  0  . paricalcitol (ZEMPLAR) 4 MCG capsule Take 4 mg by mouth daily.         Physical Examination  Filed Vitals:   06/07/12 1510  BP: 121/67  Pulse: 80  Temp: 98.4 F (36.9 C)    Body mass index is 28.04 kg/(m^2).  General:  WDWN in NAD Gait:  Normal HENT: WNL Eyes: Pupils equal Pulmonary: normal non-labored breathing , without Rales, rhonchi,  wheezing Cardiac: RRR, without  Murmurs, rubs or gallops; Abdomen: soft, NT, no masses Skin: no rashes, ulcers noted Vascular Exam/Pulses: 2 + Bilat radial pulse palp 5/5 grip BUE Extremities without ischemic changes, no Gangrene , no cellulitis; no open wounds;  Musculoskeletal: no muscle wasting or atrophy  Neurologic: A&O X 3; Appropriate Affect ; SENSATION: normal; MOTOR FUNCTION:  moving all extremities equally. Speech is fluent/normal  Non-Invasive Vascular Imaging:06/07/2012 Vein mapping of right arm shows good sized cephalic and basilic veins  USG of LUE shows open basilic vein. IR venogram reviewed by Dr. Donnetta Hutching with good flow through axillary vein and no central stenosis  ASSESSMENT/Plan:  [name] is a 71 y.o. male With ESRD and occluded left forearm loop AVGG.  With good central flow from the axillary vein of the LUE. Dr. Donnetta Hutching felt a LUE upper arm Brachial artery to axillary vein AVGG was the best next option for this patient We will schedule this procedure on a T or TH   Avelyn Touch J  Clinic MD : [place]

## 2012-06-08 DIAGNOSIS — Z992 Dependence on renal dialysis: Secondary | ICD-10-CM | POA: Diagnosis not present

## 2012-06-08 DIAGNOSIS — N186 End stage renal disease: Secondary | ICD-10-CM | POA: Diagnosis not present

## 2012-06-08 DIAGNOSIS — D509 Iron deficiency anemia, unspecified: Secondary | ICD-10-CM | POA: Diagnosis not present

## 2012-06-08 DIAGNOSIS — N2581 Secondary hyperparathyroidism of renal origin: Secondary | ICD-10-CM | POA: Diagnosis not present

## 2012-06-08 DIAGNOSIS — N039 Chronic nephritic syndrome with unspecified morphologic changes: Secondary | ICD-10-CM | POA: Diagnosis not present

## 2012-06-08 DIAGNOSIS — T82898A Other specified complication of vascular prosthetic devices, implants and grafts, initial encounter: Secondary | ICD-10-CM | POA: Diagnosis not present

## 2012-06-10 ENCOUNTER — Other Ambulatory Visit: Payer: Self-pay

## 2012-06-10 DIAGNOSIS — D631 Anemia in chronic kidney disease: Secondary | ICD-10-CM | POA: Diagnosis not present

## 2012-06-10 DIAGNOSIS — T82898A Other specified complication of vascular prosthetic devices, implants and grafts, initial encounter: Secondary | ICD-10-CM | POA: Diagnosis not present

## 2012-06-10 DIAGNOSIS — N2581 Secondary hyperparathyroidism of renal origin: Secondary | ICD-10-CM | POA: Diagnosis not present

## 2012-06-10 DIAGNOSIS — N186 End stage renal disease: Secondary | ICD-10-CM | POA: Diagnosis not present

## 2012-06-10 DIAGNOSIS — Z992 Dependence on renal dialysis: Secondary | ICD-10-CM | POA: Diagnosis not present

## 2012-06-10 DIAGNOSIS — D509 Iron deficiency anemia, unspecified: Secondary | ICD-10-CM | POA: Diagnosis not present

## 2012-06-13 DIAGNOSIS — N2581 Secondary hyperparathyroidism of renal origin: Secondary | ICD-10-CM | POA: Diagnosis not present

## 2012-06-13 DIAGNOSIS — D631 Anemia in chronic kidney disease: Secondary | ICD-10-CM | POA: Diagnosis not present

## 2012-06-13 DIAGNOSIS — D509 Iron deficiency anemia, unspecified: Secondary | ICD-10-CM | POA: Diagnosis not present

## 2012-06-13 DIAGNOSIS — T82898A Other specified complication of vascular prosthetic devices, implants and grafts, initial encounter: Secondary | ICD-10-CM | POA: Diagnosis not present

## 2012-06-13 DIAGNOSIS — N039 Chronic nephritic syndrome with unspecified morphologic changes: Secondary | ICD-10-CM | POA: Diagnosis not present

## 2012-06-13 DIAGNOSIS — Z992 Dependence on renal dialysis: Secondary | ICD-10-CM | POA: Diagnosis not present

## 2012-06-13 DIAGNOSIS — N186 End stage renal disease: Secondary | ICD-10-CM | POA: Diagnosis not present

## 2012-06-15 ENCOUNTER — Encounter (HOSPITAL_COMMUNITY): Payer: Self-pay | Admitting: *Deleted

## 2012-06-15 DIAGNOSIS — T82898A Other specified complication of vascular prosthetic devices, implants and grafts, initial encounter: Secondary | ICD-10-CM | POA: Diagnosis not present

## 2012-06-15 DIAGNOSIS — N186 End stage renal disease: Secondary | ICD-10-CM | POA: Diagnosis not present

## 2012-06-15 DIAGNOSIS — N2581 Secondary hyperparathyroidism of renal origin: Secondary | ICD-10-CM | POA: Diagnosis not present

## 2012-06-15 DIAGNOSIS — Z992 Dependence on renal dialysis: Secondary | ICD-10-CM | POA: Diagnosis not present

## 2012-06-15 DIAGNOSIS — D509 Iron deficiency anemia, unspecified: Secondary | ICD-10-CM | POA: Diagnosis not present

## 2012-06-15 DIAGNOSIS — D631 Anemia in chronic kidney disease: Secondary | ICD-10-CM | POA: Diagnosis not present

## 2012-06-15 MED ORDER — CEFAZOLIN SODIUM-DEXTROSE 2-3 GM-% IV SOLR
2.0000 g | Freq: Once | INTRAVENOUS | Status: DC
  Filled 2012-06-15: qty 50

## 2012-06-16 ENCOUNTER — Telehealth: Payer: Self-pay | Admitting: Vascular Surgery

## 2012-06-16 ENCOUNTER — Ambulatory Visit (HOSPITAL_COMMUNITY): Payer: Medicare Other

## 2012-06-16 ENCOUNTER — Encounter (HOSPITAL_COMMUNITY): Payer: Self-pay | Admitting: Anesthesiology

## 2012-06-16 ENCOUNTER — Ambulatory Visit (HOSPITAL_COMMUNITY)
Admission: RE | Admit: 2012-06-16 | Discharge: 2012-06-16 | Disposition: A | Discharge status: Home / Self Care | Payer: Medicare Other | Source: Ambulatory Visit | Attending: Vascular Surgery | Admitting: Vascular Surgery

## 2012-06-16 ENCOUNTER — Other Ambulatory Visit: Payer: Self-pay | Admitting: *Deleted

## 2012-06-16 ENCOUNTER — Encounter (HOSPITAL_COMMUNITY)
Admission: RE | Disposition: A | Discharge status: Home / Self Care | Payer: Self-pay | Source: Ambulatory Visit | Attending: Vascular Surgery

## 2012-06-16 ENCOUNTER — Ambulatory Visit (HOSPITAL_COMMUNITY): Payer: Medicare Other | Admitting: Anesthesiology

## 2012-06-16 DIAGNOSIS — I4891 Unspecified atrial fibrillation: Secondary | ICD-10-CM | POA: Insufficient documentation

## 2012-06-16 DIAGNOSIS — E119 Type 2 diabetes mellitus without complications: Secondary | ICD-10-CM | POA: Insufficient documentation

## 2012-06-16 DIAGNOSIS — I10 Essential (primary) hypertension: Secondary | ICD-10-CM | POA: Diagnosis not present

## 2012-06-16 DIAGNOSIS — Z992 Dependence on renal dialysis: Secondary | ICD-10-CM | POA: Diagnosis not present

## 2012-06-16 DIAGNOSIS — N186 End stage renal disease: Secondary | ICD-10-CM

## 2012-06-16 DIAGNOSIS — Z48812 Encounter for surgical aftercare following surgery on the circulatory system: Secondary | ICD-10-CM

## 2012-06-16 DIAGNOSIS — N189 Chronic kidney disease, unspecified: Secondary | ICD-10-CM | POA: Diagnosis not present

## 2012-06-16 DIAGNOSIS — G4730 Sleep apnea, unspecified: Secondary | ICD-10-CM | POA: Insufficient documentation

## 2012-06-16 DIAGNOSIS — I509 Heart failure, unspecified: Secondary | ICD-10-CM | POA: Diagnosis not present

## 2012-06-16 DIAGNOSIS — I129 Hypertensive chronic kidney disease with stage 1 through stage 4 chronic kidney disease, or unspecified chronic kidney disease: Secondary | ICD-10-CM | POA: Insufficient documentation

## 2012-06-16 HISTORY — PX: OTHER SURGICAL HISTORY: SHX169

## 2012-06-16 LAB — POCT I-STAT 4, (NA,K, GLUC, HGB,HCT)
Glucose, Bld: 103 mg/dL — ABNORMAL HIGH (ref 70–99)
HCT: 37 % — ABNORMAL LOW (ref 39.0–52.0)
Potassium: 4 mEq/L (ref 3.5–5.1)
Sodium: 139 mEq/L (ref 135–145)

## 2012-06-16 LAB — SURGICAL PCR SCREEN
MRSA, PCR: NEGATIVE
Staphylococcus aureus: NEGATIVE

## 2012-06-16 LAB — GLUCOSE, CAPILLARY: Glucose-Capillary: 87 mg/dL (ref 70–99)

## 2012-06-16 SURGERY — TRANSPOSITION, VEIN, BASILIC
Anesthesia: General | Site: Arm Upper | Laterality: Left | Wound class: Clean

## 2012-06-16 MED ORDER — OXYCODONE-ACETAMINOPHEN 5-325 MG PO TABS: 1.0000 | ORAL_TABLET | ORAL | Status: AC | PRN

## 2012-06-16 MED ORDER — FENTANYL CITRATE 0.05 MG/ML IJ SOLN: 25.0000 ug | INTRAMUSCULAR | Status: DC | PRN

## 2012-06-16 MED ORDER — PROTAMINE SULFATE 10 MG/ML IV SOLN
INTRAVENOUS | Status: DC | PRN
  Administered 2012-06-16: 40 mg via INTRAVENOUS
  Administered 2012-06-16: 20 mg via INTRAVENOUS

## 2012-06-16 MED ORDER — LIDOCAINE HCL (CARDIAC) 20 MG/ML IV SOLN
INTRAVENOUS | Status: DC | PRN
  Administered 2012-06-16: 40 mg via INTRAVENOUS

## 2012-06-16 MED ORDER — HEPARIN SODIUM (PORCINE) 1000 UNIT/ML IJ SOLN
INTRAMUSCULAR | Status: DC | PRN
  Administered 2012-06-16: 7000 [IU] via INTRAVENOUS

## 2012-06-16 MED ORDER — MIDAZOLAM HCL 5 MG/5ML IJ SOLN
INTRAMUSCULAR | Status: DC | PRN
  Administered 2012-06-16: 1 mg via INTRAVENOUS

## 2012-06-16 MED ORDER — 0.9 % SODIUM CHLORIDE (POUR BTL) OPTIME
TOPICAL | Status: DC | PRN
  Administered 2012-06-16: 1000 mL

## 2012-06-16 MED ORDER — FENTANYL CITRATE 0.05 MG/ML IJ SOLN
INTRAMUSCULAR | Status: DC | PRN
  Administered 2012-06-16 (×4): 50 ug via INTRAVENOUS

## 2012-06-16 MED ORDER — PROPOFOL 10 MG/ML IV EMUL
INTRAVENOUS | Status: DC | PRN
  Administered 2012-06-16: 150 mg via INTRAVENOUS

## 2012-06-16 MED ORDER — SODIUM CHLORIDE 0.9 % IV SOLN
INTRAVENOUS | Status: DC | PRN
  Administered 2012-06-16: 07:00:00 via INTRAVENOUS

## 2012-06-16 MED ORDER — CEFAZOLIN SODIUM 1-5 GM-% IV SOLN
INTRAVENOUS | Status: DC | PRN
  Administered 2012-06-16: 2 g via INTRAVENOUS

## 2012-06-16 MED ORDER — MUPIROCIN 2 % EX OINT
TOPICAL_OINTMENT | CUTANEOUS | Status: AC
  Administered 2012-06-16: 1 via NASAL
  Filled 2012-06-16: qty 22

## 2012-06-16 MED ORDER — HEPARIN SODIUM (PORCINE) 1000 UNIT/ML IJ SOLN
1000.0000 [IU] | Freq: Once | INTRAMUSCULAR | Status: AC
  Administered 2012-06-16: 2400 [IU] via INTRAVENOUS

## 2012-06-16 MED ORDER — THROMBIN 20000 UNITS EX KIT
PACK | CUTANEOUS | Status: DC | PRN
  Administered 2012-06-16: 11:00:00 via TOPICAL

## 2012-06-16 MED ORDER — SODIUM CHLORIDE 0.9 % IV SOLN: INTRAVENOUS | Status: DC

## 2012-06-16 MED ORDER — THROMBIN 20000 UNITS EX SOLR
CUTANEOUS | Status: AC
  Filled 2012-06-16: qty 20000

## 2012-06-16 MED ORDER — ONDANSETRON HCL 4 MG/2ML IJ SOLN
INTRAMUSCULAR | Status: DC | PRN
  Administered 2012-06-16: 4 mg via INTRAVENOUS

## 2012-06-16 MED ORDER — SODIUM CHLORIDE 0.9 % IR SOLN
Status: DC | PRN
  Administered 2012-06-16: 09:00:00

## 2012-06-16 SURGICAL SUPPLY — 42 items
ADH SKN CLS APL DERMABOND .7 (GAUZE/BANDAGES/DRESSINGS) ×1
CANISTER SUCTION 2500CC (MISCELLANEOUS) ×2 IMPLANT
CLIP TI MEDIUM 6 (CLIP) ×2 IMPLANT
CLIP TI WIDE RED SMALL 24 (CLIP) ×1 IMPLANT
CLIP TI WIDE RED SMALL 6 (CLIP) ×2 IMPLANT
CLOTH BEACON ORANGE TIMEOUT ST (SAFETY) ×2 IMPLANT
COVER SURGICAL LIGHT HANDLE (MISCELLANEOUS) ×4 IMPLANT
DECANTER SPIKE VIAL GLASS SM (MISCELLANEOUS) ×2 IMPLANT
DERMABOND ADVANCED (GAUZE/BANDAGES/DRESSINGS) ×1
DERMABOND ADVANCED .7 DNX12 (GAUZE/BANDAGES/DRESSINGS) ×1 IMPLANT
ELECT REM PT RETURN 9FT ADLT (ELECTROSURGICAL) ×2
ELECTRODE REM PT RTRN 9FT ADLT (ELECTROSURGICAL) ×1 IMPLANT
GLOVE BIO SURGEON STRL SZ7.5 (GLOVE) ×2 IMPLANT
GLOVE BIOGEL PI IND STRL 6.5 (GLOVE) IMPLANT
GLOVE BIOGEL PI IND STRL 7.0 (GLOVE) IMPLANT
GLOVE BIOGEL PI IND STRL 7.5 (GLOVE) ×1 IMPLANT
GLOVE BIOGEL PI INDICATOR 6.5 (GLOVE) ×2
GLOVE BIOGEL PI INDICATOR 7.0 (GLOVE) ×2
GLOVE BIOGEL PI INDICATOR 7.5 (GLOVE) ×2
GLOVE ECLIPSE 6.5 STRL STRAW (GLOVE) ×2 IMPLANT
GLOVE SURG SS PI 7.0 STRL IVOR (GLOVE) ×1 IMPLANT
GLOVE SURG SS PI 8.5 STRL IVOR (GLOVE) ×1
GLOVE SURG SS PI 8.5 STRL STRW (GLOVE) IMPLANT
GOWN STRL NON-REIN LRG LVL3 (GOWN DISPOSABLE) ×8 IMPLANT
KIT BASIN OR (CUSTOM PROCEDURE TRAY) ×2 IMPLANT
KIT ROOM TURNOVER OR (KITS) ×2 IMPLANT
NS IRRIG 1000ML POUR BTL (IV SOLUTION) ×2 IMPLANT
PACK CV ACCESS (CUSTOM PROCEDURE TRAY) ×2 IMPLANT
PAD ARMBOARD 7.5X6 YLW CONV (MISCELLANEOUS) ×4 IMPLANT
SPONGE SURGIFOAM ABS GEL 100 (HEMOSTASIS) ×1 IMPLANT
SUT PROLENE 5 0 C 1 24 (SUTURE) ×2 IMPLANT
SUT PROLENE 6 0 BV (SUTURE) ×4 IMPLANT
SUT SILK 2 0 SH (SUTURE) ×1 IMPLANT
SUT SILK 3 0 (SUTURE) ×2
SUT SILK 3-0 18XBRD TIE 12 (SUTURE) IMPLANT
SUT VIC AB 3-0 SH 27 (SUTURE) ×4
SUT VIC AB 3-0 SH 27X BRD (SUTURE) ×2 IMPLANT
SUT VICRYL 4-0 PS2 18IN ABS (SUTURE) ×4 IMPLANT
TOWEL OR 17X24 6PK STRL BLUE (TOWEL DISPOSABLE) ×2 IMPLANT
TOWEL OR 17X26 10 PK STRL BLUE (TOWEL DISPOSABLE) ×2 IMPLANT
UNDERPAD 30X30 INCONTINENT (UNDERPADS AND DIAPERS) ×2 IMPLANT
WATER STERILE IRR 1000ML POUR (IV SOLUTION) ×2 IMPLANT

## 2012-06-16 NOTE — Interval H&P Note (Signed)
History and Physical Interval Note:  06/16/2012 7:22 AM  [name]  has presented today for surgery, with the diagnosis of End Stage Renal Disease  The various methods of treatment have been discussed with the patient and family. After consideration of risks, benefits and other options for treatment, the patient has consented to: INSERTION OF ARTERIOVENOUS (AV) GORE-TEX GRAFT ARM (Left) as a surgical intervention .  The patient's history has been reviewed, patient examined, no change in status, stable for surgery.  I have reviewed the patients' chart and labs.  Questions were answered to the patient's satisfaction.     Baltasar Twilley S

## 2012-06-16 NOTE — Anesthesia Preprocedure Evaluation (Addendum)
Anesthesia Evaluation  Patient identified by MRN, date of birth, ID band Patient awake    Reviewed: Allergy & Precautions, H&P , NPO status , Patient's Chart, lab work & pertinent test results, reviewed documented beta blocker date and time   Airway Mallampati: I  Neck ROM: Full    Dental   Pulmonary sleep apnea and Continuous Positive Airway Pressure Ventilation ,          Cardiovascular hypertension, +CHF + dysrhythmias Atrial Fibrillation  EKG - normal sinus   Neuro/Psych    GI/Hepatic   Endo/Other  Diabetes mellitus-, Type 2  Renal/GU      Musculoskeletal   Abdominal   Peds  Hematology   Anesthesia Other Findings   Reproductive/Obstetrics                          Anesthesia Physical Anesthesia Plan  ASA: III  Anesthesia Plan: General   Post-op Pain Management:    Induction: Intravenous  Airway Management Planned: LMA  Additional Equipment:   Intra-op Plan:   Post-operative Plan: Extubation in OR  Informed Consent: I have reviewed the patients History and Physical, chart, labs and discussed the procedure including the risks, benefits and alternatives for the proposed anesthesia with the patient or authorized representative who has indicated his/her understanding and acceptance.   Dental advisory given  Plan Discussed with: CRNA, Anesthesiologist and Surgeon  Anesthesia Plan Comments:         Anesthesia Quick Evaluation

## 2012-06-16 NOTE — Transfer of Care (Signed)
Immediate Anesthesia Transfer of Care Note  Patient: [name]  Procedure(s) Performed: Procedure(s) (LRB): BASCILIC VEIN TRANSPOSITION (Left)  Patient Location: PACU  Anesthesia Type: General  Level of Consciousness: awake and patient cooperative  Airway & Oxygen Therapy: Patient Spontanous Breathing and Patient connected to face mask oxygen  Post-op Assessment: Report given to PACU RN  Post vital signs: Reviewed and stable  Complications: No apparent anesthesia complications

## 2012-06-16 NOTE — Progress Notes (Signed)
HD blue port capped off. Flushed with 10cc NS with GBR. Flushed with Heparin 2.4ml (1000u/ml) per priming volume in the blue port.  Cap cleaned, dead end cap placed on the blue port, both clamps taped, red "high dose heparin" sticker placed on the cathter.  [name] M  

## 2012-06-16 NOTE — Anesthesia Procedure Notes (Signed)
Procedure Name: LMA Insertion Date/Time: 06/16/2012 7:37 AM Performed by: Jon Gills A Pre-anesthesia Checklist: Patient identified, Emergency Drugs available, Suction available and Patient being monitored Patient Re-evaluated:Patient Re-evaluated prior to inductionOxygen Delivery Method: Circle system utilized Preoxygenation: Pre-oxygenation with 100% oxygen Intubation Type: IV induction LMA: LMA with gastric port inserted LMA Size: 5.0 Number of attempts: 1 Placement Confirmation: positive ETCO2 and breath sounds checked- equal and bilateral Tube secured with: Tape Dental Injury: Teeth and Oropharynx as per pre-operative assessment

## 2012-06-16 NOTE — Anesthesia Postprocedure Evaluation (Signed)
  Anesthesia Post-op Note  Patient: [name]  Procedure(s) Performed: Procedure(s) (LRB): BASCILIC VEIN TRANSPOSITION (Left)  Patient Location: PACU  Anesthesia Type: General  Level of Consciousness: awake and alert   Airway and Oxygen Therapy: Patient Spontanous Breathing  Post-op Pain: mild  Post-op Assessment: Post-op Vital signs reviewed, Patient's Cardiovascular Status Stable, Respiratory Function Stable, Patent Airway and No signs of Nausea or vomiting  Post-op Vital Signs: Reviewed and stable  Complications: No apparent anesthesia complications

## 2012-06-16 NOTE — Telephone Encounter (Signed)
Message copied by Gena Fray on Thu Jun 16, 2012  2:25 PM ------      Message from: Alfonso Patten      Created: Thu Jun 16, 2012 12:12 PM      Regarding: FW: charges and follow up                   ----- Message -----         From: Angelia Mould, MD         Sent: 06/16/2012  10:48 AM           To: Patrici Ranks, Alfonso Patten, RN      Subject: charges and follow up                                    PROCEDURE: left basilic vein transposition            SURGEON: Judeth Cornfield. Scot Dock, MD, FACS            ASSIST: Wray Kearns PA            This patient will need a follow up visit in 6 weeks to check on the maturation of the fistula.

## 2012-06-16 NOTE — Preoperative (Signed)
Beta Blockers   Reason not to administer Beta Blockers:Not Applicable 

## 2012-06-16 NOTE — Procedures (Unsigned)
CEPHALIC VEIN MAPPING  INDICATION:  Preop evaluation, end-stage renal disease.  HISTORY:  EXAM:  The right cephalic vein is compressible with diameter measurements ranging from 0.28 to 0.44 cm.  The right basilic vein is compressible with diameter measurements ranging from 0.25 to 0.43 cm.  IMPRESSION:  Patent right cephalic and basilic veins with diameter measurements as described above.  ___________________________________________ Rosetta Posner, M.D.  CH/MEDQ  D:  06/10/2012  T:  06/10/2012  Job:  [id number]

## 2012-06-16 NOTE — Op Note (Signed)
NAME: [name]   MRN: [id number] DOB: [birth year]    DATE OF OPERATION: 06/16/2012  PREOP DIAGNOSIS: chronic kidney disease  POSTOP DIAGNOSIS: same  PROCEDURE: left basilic vein transposition  SURGEON: Judeth Cornfield. Scot Dock, MD, FACS  ASSIST: Wray Kearns PA  ANESTHESIA: Gen.   EBL: minimal  INDICATIONS: [name] is a 71 y.o. male who currently dialyzes with a dietetic catheter. He was brought in for new access.  FINDINGS: the basilic vein was a reasonable in size. The brachial artery was 3.5 mm.  TECHNIQUE: The patient was brought to the operating room and received a general anesthetic. Using the ultrasound scanner I looked at the basilic vein which looked to be reasonable in size. It did empty into the brachial system in the mid upper arm. I elected to proceed with basilic vein transposition. Using 4 incisions along the medial aspect of the left arm the basilic vein was harvested from the antecubital level to the axilla. Branches were divided between clips and 3-0 silk ties. I dissected the brachial vein from the area where the basilic vein entered into the brachial system up to the axilla. Through the distal incision the brachial artery was dissected free beneath the fascia. The vein was ligated distally and irrigated with heparinized saline. It was felt to be reasonable size. It was then tunneled lateral in the upper arm and the patient was then heparinized. The brachial artery was clamped proximally and distally and a longitudinal arteriotomy was made. The vein was cut to the appropriate length spatulated and sewn end-to-side to the vein using continuous 6-0 Prolene suture. The completion was an excellent thrill in the fistula. Hemostasis was obtained in the wound and into the wounds and close the 2 deep layers of 3-0 Vicryl and the skin closed with 4-0 Vicryl. Dermabond was applied. The patient tolerated the procedure well and was transferred to the recovery room in stable  condition. All needle and sponge counts were correct.  Deitra Mayo, MD, FACS [facility]  DATE OF DICTATION:   06/16/2012

## 2012-06-16 NOTE — H&P (View-Only) (Signed)
[facility] [place] HISTORY AND PHYSICAL   CC: ESRD Referring Physician: Dr. Florene Glen [facility] MWF - [facility]  History of Present Illness:[name] is a 71 y.o. 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 He had a recent  Thrombectomy by IR on 05/19/12 which again occluded necessitating a HD catheter be placed by Dr. Kellie Simmering on 05/28/12. He also had previous stenting of the brachial vein. He is here for evaluation for new access  No results found.  Past Medical History  Diagnosis Date  . DM (diabetes mellitus)   . CKD (chronic kidney disease)   . CHF (congestive heart failure)   . HTN (hypertension)   . Gout   . Hyperlipidemia   . Chronic kidney disease   . Secondary hyperparathyroidism of renal origin   . Anemia   . BPH (benign prostatic hyperplasia)   . History of GI bleed     secondary to coumadin  . A-fib   . OSA (obstructive sleep apnea)     uses CPAP  . Blood transfusion   . Diarrhea     ROS: [x]  Positive   [ ]  Denies    General: [ ]  Weight loss, [ ]  Fever, [ ]  chills Neurologic: [ ]  Dizziness, [ ]  Blackouts, [ ]  Seizure [ ]  Stroke, [ ]  "Mini stroke", [ ]  Slurred speech, [ ]  Temporary blindness; [ ]  weakness in arms or legs, [ ]  Hoarseness Cardiac: [ ]  Chest pain/pressure, [ ]  Shortness of breath at rest [ x] Shortness of breath with exertion, [ ]  Atrial fibrillation or irregular heartbeat Vascular: [ ]  Pain in legs with walking, [ ]  Pain in legs at rest, [ ]  Pain in legs at night,  [ ]  Non-healing ulcer, [ ]  Blood clot in vein/DVT,   Pulmonary: [ ]  Home oxygen, [ ]  Productive cough, [ ]  Coughing up blood, [ ]  Asthma,  [ ]  Wheezing Musculoskeletal:  [ ]  Arthritis, [ ]  Low back pain, [ ]  Joint pain Hematologic: [ ]  Easy Bruising, [ ]  Anemia; [ ]  Hepatitis Gastrointestinal: [ ]  Blood in stool, [ ]  Gastroesophageal Reflux/heartburn, [ ]  Trouble swallowing Urinary: [x ] chronic Kidney disease, [x ]  on HD - [ x] MWF or [ ]  TTHS, [ ]  Burning with urination, [ ]  Difficulty urinating Skin: [ x] Rashes, [ ]  Wounds Psychological: [ ]  Anxiety, [ ]  Depression   Social History History  Substance Use Topics  . Smoking status: Former Smoker    Types: Cigarettes    Quit date: 06/30/2004  . Smokeless tobacco: Never Used  . Alcohol Use: No    Family History Family History  Problem Relation Age of Onset  . Anesthesia problems Neg Hx   . Cancer Daughter     breast cancer  . Diabetes Son   . Heart disease Son     before age 75  . Hypertension Son     No Known Allergies  Current Outpatient Prescriptions  Medication Sig Dispense Refill  . acetaminophen (TYLENOL) 325 MG tablet Take 650 mg by mouth every 6 (six) hours as needed. Headache or pain      . aspirin (BAYER ASPIRIN) 325 MG tablet Take 1 tablet (325 mg total) by mouth daily.  100 tablet  3  . atorvastatin (LIPITOR) 40 MG tablet Take 40 mg by mouth daily.      . B Complex-C-Folic Acid (NEPHRO-VITE RX PO) Take by  mouth daily.        . calcium acetate (PHOSLO) 667 MG capsule Take 3 tablets by mouth 3 (three) times daily.       Marland Kitchen gabapentin (NEURONTIN) 100 MG tablet Take 100 mg by mouth daily.        . metoprolol succinate (TOPROL-XL) 25 MG 24 hr tablet Take 25 mg by mouth daily.        . repaglinide (PRANDIN) 1 MG tablet Take 1 mg by mouth 3 (three) times daily before meals.       Marland Kitchen amiodarone (PACERONE) 200 MG tablet Take 200 mg by mouth daily.        Marland Kitchen atorvastatin (LIPITOR) 40 MG tablet Take 1 tablet (40 mg total) by mouth daily.  30 tablet  3  . oxyCODONE-acetaminophen (ROXICET) 5-325 MG per tablet Take 1 tablet by mouth every 6 (six) hours as needed for pain.  30 tablet  0  . paricalcitol (ZEMPLAR) 4 MCG capsule Take 4 mg by mouth daily.         Physical Examination  Filed Vitals:   06/07/12 1510  BP: 121/67  Pulse: 80  Temp: 98.4 F (36.9 C)    Body mass index is 28.04 kg/(m^2).  General:  WDWN in NAD Gait:  Normal HENT: WNL Eyes: Pupils equal Pulmonary: normal non-labored breathing , without Rales, rhonchi,  wheezing Cardiac: RRR, without  Murmurs, rubs or gallops; Abdomen: soft, NT, no masses Skin: no rashes, ulcers noted Vascular Exam/Pulses: 2 + Bilat radial pulse palp 5/5 grip BUE Extremities without ischemic changes, no Gangrene , no cellulitis; no open wounds;  Musculoskeletal: no muscle wasting or atrophy  Neurologic: A&O X 3; Appropriate Affect ; SENSATION: normal; MOTOR FUNCTION:  moving all extremities equally. Speech is fluent/normal  Non-Invasive Vascular Imaging:06/07/2012 Vein mapping of right arm shows good sized cephalic and basilic veins  USG of LUE shows open basilic vein. IR venogram reviewed by Dr. Donnetta Hutching with good flow through axillary vein and no central stenosis  ASSESSMENT/Plan:  [name] is a 71 y.o. male With ESRD and occluded left forearm loop AVGG.  With good central flow from the axillary vein of the LUE. Dr. Donnetta Hutching felt a LUE upper arm Brachial artery to axillary vein AVGG was the best next option for this patient We will schedule this procedure on a T or TH   Keone Kamer J  Clinic MD : Early

## 2012-06-16 NOTE — Telephone Encounter (Signed)
Scheduled with duplex, sent to pt in mail and vm left for pt, dpm

## 2012-06-17 DIAGNOSIS — D509 Iron deficiency anemia, unspecified: Secondary | ICD-10-CM | POA: Diagnosis not present

## 2012-06-17 DIAGNOSIS — T82898A Other specified complication of vascular prosthetic devices, implants and grafts, initial encounter: Secondary | ICD-10-CM | POA: Diagnosis not present

## 2012-06-17 DIAGNOSIS — N2581 Secondary hyperparathyroidism of renal origin: Secondary | ICD-10-CM | POA: Diagnosis not present

## 2012-06-17 DIAGNOSIS — Z992 Dependence on renal dialysis: Secondary | ICD-10-CM | POA: Diagnosis not present

## 2012-06-17 DIAGNOSIS — D631 Anemia in chronic kidney disease: Secondary | ICD-10-CM | POA: Diagnosis not present

## 2012-06-17 DIAGNOSIS — N186 End stage renal disease: Secondary | ICD-10-CM | POA: Diagnosis not present

## 2012-06-20 DIAGNOSIS — N186 End stage renal disease: Secondary | ICD-10-CM | POA: Diagnosis not present

## 2012-06-20 DIAGNOSIS — N2581 Secondary hyperparathyroidism of renal origin: Secondary | ICD-10-CM | POA: Diagnosis not present

## 2012-06-20 DIAGNOSIS — D631 Anemia in chronic kidney disease: Secondary | ICD-10-CM | POA: Diagnosis not present

## 2012-06-20 DIAGNOSIS — D509 Iron deficiency anemia, unspecified: Secondary | ICD-10-CM | POA: Diagnosis not present

## 2012-06-20 DIAGNOSIS — Z992 Dependence on renal dialysis: Secondary | ICD-10-CM | POA: Diagnosis not present

## 2012-06-20 DIAGNOSIS — T82898A Other specified complication of vascular prosthetic devices, implants and grafts, initial encounter: Secondary | ICD-10-CM | POA: Diagnosis not present

## 2012-06-21 ENCOUNTER — Encounter (INDEPENDENT_AMBULATORY_CARE_PROVIDER_SITE_OTHER): Payer: Medicare Other | Admitting: Ophthalmology

## 2012-06-21 ENCOUNTER — Other Ambulatory Visit: Payer: Self-pay | Admitting: Cardiology

## 2012-06-22 DIAGNOSIS — T82898A Other specified complication of vascular prosthetic devices, implants and grafts, initial encounter: Secondary | ICD-10-CM | POA: Diagnosis not present

## 2012-06-22 DIAGNOSIS — Z992 Dependence on renal dialysis: Secondary | ICD-10-CM | POA: Diagnosis not present

## 2012-06-22 DIAGNOSIS — N2581 Secondary hyperparathyroidism of renal origin: Secondary | ICD-10-CM | POA: Diagnosis not present

## 2012-06-22 DIAGNOSIS — D631 Anemia in chronic kidney disease: Secondary | ICD-10-CM | POA: Diagnosis not present

## 2012-06-22 DIAGNOSIS — N186 End stage renal disease: Secondary | ICD-10-CM | POA: Diagnosis not present

## 2012-06-22 DIAGNOSIS — D509 Iron deficiency anemia, unspecified: Secondary | ICD-10-CM | POA: Diagnosis not present

## 2012-06-24 DIAGNOSIS — N2581 Secondary hyperparathyroidism of renal origin: Secondary | ICD-10-CM | POA: Diagnosis not present

## 2012-06-24 DIAGNOSIS — N186 End stage renal disease: Secondary | ICD-10-CM | POA: Diagnosis not present

## 2012-06-24 DIAGNOSIS — T82898A Other specified complication of vascular prosthetic devices, implants and grafts, initial encounter: Secondary | ICD-10-CM | POA: Diagnosis not present

## 2012-06-24 DIAGNOSIS — D509 Iron deficiency anemia, unspecified: Secondary | ICD-10-CM | POA: Diagnosis not present

## 2012-06-24 DIAGNOSIS — Z992 Dependence on renal dialysis: Secondary | ICD-10-CM | POA: Diagnosis not present

## 2012-06-24 DIAGNOSIS — D631 Anemia in chronic kidney disease: Secondary | ICD-10-CM | POA: Diagnosis not present

## 2012-06-26 DIAGNOSIS — N186 End stage renal disease: Secondary | ICD-10-CM | POA: Diagnosis not present

## 2012-06-27 DIAGNOSIS — N186 End stage renal disease: Secondary | ICD-10-CM | POA: Diagnosis not present

## 2012-06-27 DIAGNOSIS — N2581 Secondary hyperparathyroidism of renal origin: Secondary | ICD-10-CM | POA: Diagnosis not present

## 2012-06-27 DIAGNOSIS — D509 Iron deficiency anemia, unspecified: Secondary | ICD-10-CM | POA: Diagnosis not present

## 2012-06-27 DIAGNOSIS — Z992 Dependence on renal dialysis: Secondary | ICD-10-CM | POA: Diagnosis not present

## 2012-06-27 DIAGNOSIS — D631 Anemia in chronic kidney disease: Secondary | ICD-10-CM | POA: Diagnosis not present

## 2012-07-20 DIAGNOSIS — I4891 Unspecified atrial fibrillation: Secondary | ICD-10-CM | POA: Diagnosis not present

## 2012-07-20 DIAGNOSIS — Z79899 Other long term (current) drug therapy: Secondary | ICD-10-CM | POA: Diagnosis not present

## 2012-07-20 DIAGNOSIS — E119 Type 2 diabetes mellitus without complications: Secondary | ICD-10-CM | POA: Diagnosis not present

## 2012-07-21 DIAGNOSIS — R972 Elevated prostate specific antigen [PSA]: Secondary | ICD-10-CM | POA: Diagnosis not present

## 2012-07-21 DIAGNOSIS — IMO0002 Reserved for concepts with insufficient information to code with codable children: Secondary | ICD-10-CM | POA: Diagnosis not present

## 2012-07-27 ENCOUNTER — Ambulatory Visit: Payer: Medicare Other | Admitting: Vascular Surgery

## 2012-07-27 DIAGNOSIS — N186 End stage renal disease: Secondary | ICD-10-CM | POA: Diagnosis not present

## 2012-07-29 DIAGNOSIS — D631 Anemia in chronic kidney disease: Secondary | ICD-10-CM | POA: Diagnosis not present

## 2012-07-29 DIAGNOSIS — D509 Iron deficiency anemia, unspecified: Secondary | ICD-10-CM | POA: Diagnosis not present

## 2012-07-29 DIAGNOSIS — N186 End stage renal disease: Secondary | ICD-10-CM | POA: Diagnosis not present

## 2012-07-29 DIAGNOSIS — N2581 Secondary hyperparathyroidism of renal origin: Secondary | ICD-10-CM | POA: Diagnosis not present

## 2012-08-16 ENCOUNTER — Encounter: Payer: Self-pay | Admitting: Vascular Surgery

## 2012-08-17 ENCOUNTER — Ambulatory Visit: Payer: Medicare Other | Admitting: Vascular Surgery

## 2012-08-27 DIAGNOSIS — N186 End stage renal disease: Secondary | ICD-10-CM | POA: Diagnosis not present

## 2012-08-29 DIAGNOSIS — N2581 Secondary hyperparathyroidism of renal origin: Secondary | ICD-10-CM | POA: Diagnosis not present

## 2012-08-29 DIAGNOSIS — D631 Anemia in chronic kidney disease: Secondary | ICD-10-CM | POA: Diagnosis not present

## 2012-08-29 DIAGNOSIS — N186 End stage renal disease: Secondary | ICD-10-CM | POA: Diagnosis not present

## 2012-08-29 DIAGNOSIS — N039 Chronic nephritic syndrome with unspecified morphologic changes: Secondary | ICD-10-CM | POA: Diagnosis not present

## 2012-08-29 DIAGNOSIS — D509 Iron deficiency anemia, unspecified: Secondary | ICD-10-CM | POA: Diagnosis not present

## 2012-08-29 DIAGNOSIS — Z23 Encounter for immunization: Secondary | ICD-10-CM | POA: Diagnosis not present

## 2012-08-31 DIAGNOSIS — N039 Chronic nephritic syndrome with unspecified morphologic changes: Secondary | ICD-10-CM | POA: Diagnosis not present

## 2012-08-31 DIAGNOSIS — D509 Iron deficiency anemia, unspecified: Secondary | ICD-10-CM | POA: Diagnosis not present

## 2012-08-31 DIAGNOSIS — N186 End stage renal disease: Secondary | ICD-10-CM | POA: Diagnosis not present

## 2012-08-31 DIAGNOSIS — Z23 Encounter for immunization: Secondary | ICD-10-CM | POA: Diagnosis not present

## 2012-08-31 DIAGNOSIS — N2581 Secondary hyperparathyroidism of renal origin: Secondary | ICD-10-CM | POA: Diagnosis not present

## 2012-09-02 DIAGNOSIS — N039 Chronic nephritic syndrome with unspecified morphologic changes: Secondary | ICD-10-CM | POA: Diagnosis not present

## 2012-09-02 DIAGNOSIS — Z23 Encounter for immunization: Secondary | ICD-10-CM | POA: Diagnosis not present

## 2012-09-02 DIAGNOSIS — N186 End stage renal disease: Secondary | ICD-10-CM | POA: Diagnosis not present

## 2012-09-02 DIAGNOSIS — D509 Iron deficiency anemia, unspecified: Secondary | ICD-10-CM | POA: Diagnosis not present

## 2012-09-02 DIAGNOSIS — N2581 Secondary hyperparathyroidism of renal origin: Secondary | ICD-10-CM | POA: Diagnosis not present

## 2012-09-05 DIAGNOSIS — N186 End stage renal disease: Secondary | ICD-10-CM | POA: Diagnosis not present

## 2012-09-05 DIAGNOSIS — N2581 Secondary hyperparathyroidism of renal origin: Secondary | ICD-10-CM | POA: Diagnosis not present

## 2012-09-05 DIAGNOSIS — D631 Anemia in chronic kidney disease: Secondary | ICD-10-CM | POA: Diagnosis not present

## 2012-09-05 DIAGNOSIS — Z23 Encounter for immunization: Secondary | ICD-10-CM | POA: Diagnosis not present

## 2012-09-05 DIAGNOSIS — D509 Iron deficiency anemia, unspecified: Secondary | ICD-10-CM | POA: Diagnosis not present

## 2012-09-07 DIAGNOSIS — D509 Iron deficiency anemia, unspecified: Secondary | ICD-10-CM | POA: Diagnosis not present

## 2012-09-07 DIAGNOSIS — N2581 Secondary hyperparathyroidism of renal origin: Secondary | ICD-10-CM | POA: Diagnosis not present

## 2012-09-07 DIAGNOSIS — N186 End stage renal disease: Secondary | ICD-10-CM | POA: Diagnosis not present

## 2012-09-07 DIAGNOSIS — D631 Anemia in chronic kidney disease: Secondary | ICD-10-CM | POA: Diagnosis not present

## 2012-09-07 DIAGNOSIS — Z23 Encounter for immunization: Secondary | ICD-10-CM | POA: Diagnosis not present

## 2012-09-09 DIAGNOSIS — N039 Chronic nephritic syndrome with unspecified morphologic changes: Secondary | ICD-10-CM | POA: Diagnosis not present

## 2012-09-09 DIAGNOSIS — N186 End stage renal disease: Secondary | ICD-10-CM | POA: Diagnosis not present

## 2012-09-09 DIAGNOSIS — Z23 Encounter for immunization: Secondary | ICD-10-CM | POA: Diagnosis not present

## 2012-09-09 DIAGNOSIS — N2581 Secondary hyperparathyroidism of renal origin: Secondary | ICD-10-CM | POA: Diagnosis not present

## 2012-09-09 DIAGNOSIS — D509 Iron deficiency anemia, unspecified: Secondary | ICD-10-CM | POA: Diagnosis not present

## 2012-09-12 DIAGNOSIS — N2581 Secondary hyperparathyroidism of renal origin: Secondary | ICD-10-CM | POA: Diagnosis not present

## 2012-09-12 DIAGNOSIS — D631 Anemia in chronic kidney disease: Secondary | ICD-10-CM | POA: Diagnosis not present

## 2012-09-12 DIAGNOSIS — Z23 Encounter for immunization: Secondary | ICD-10-CM | POA: Diagnosis not present

## 2012-09-12 DIAGNOSIS — N186 End stage renal disease: Secondary | ICD-10-CM | POA: Diagnosis not present

## 2012-09-12 DIAGNOSIS — D509 Iron deficiency anemia, unspecified: Secondary | ICD-10-CM | POA: Diagnosis not present

## 2012-09-14 DIAGNOSIS — N186 End stage renal disease: Secondary | ICD-10-CM | POA: Diagnosis not present

## 2012-09-14 DIAGNOSIS — N039 Chronic nephritic syndrome with unspecified morphologic changes: Secondary | ICD-10-CM | POA: Diagnosis not present

## 2012-09-14 DIAGNOSIS — D509 Iron deficiency anemia, unspecified: Secondary | ICD-10-CM | POA: Diagnosis not present

## 2012-09-14 DIAGNOSIS — N2581 Secondary hyperparathyroidism of renal origin: Secondary | ICD-10-CM | POA: Diagnosis not present

## 2012-09-14 DIAGNOSIS — Z23 Encounter for immunization: Secondary | ICD-10-CM | POA: Diagnosis not present

## 2012-09-16 DIAGNOSIS — Z23 Encounter for immunization: Secondary | ICD-10-CM | POA: Diagnosis not present

## 2012-09-16 DIAGNOSIS — N186 End stage renal disease: Secondary | ICD-10-CM | POA: Diagnosis not present

## 2012-09-16 DIAGNOSIS — D631 Anemia in chronic kidney disease: Secondary | ICD-10-CM | POA: Diagnosis not present

## 2012-09-16 DIAGNOSIS — N2581 Secondary hyperparathyroidism of renal origin: Secondary | ICD-10-CM | POA: Diagnosis not present

## 2012-09-16 DIAGNOSIS — D509 Iron deficiency anemia, unspecified: Secondary | ICD-10-CM | POA: Diagnosis not present

## 2012-09-19 DIAGNOSIS — N186 End stage renal disease: Secondary | ICD-10-CM | POA: Diagnosis not present

## 2012-09-19 DIAGNOSIS — Z23 Encounter for immunization: Secondary | ICD-10-CM | POA: Diagnosis not present

## 2012-09-19 DIAGNOSIS — D631 Anemia in chronic kidney disease: Secondary | ICD-10-CM | POA: Diagnosis not present

## 2012-09-19 DIAGNOSIS — D509 Iron deficiency anemia, unspecified: Secondary | ICD-10-CM | POA: Diagnosis not present

## 2012-09-19 DIAGNOSIS — N2581 Secondary hyperparathyroidism of renal origin: Secondary | ICD-10-CM | POA: Diagnosis not present

## 2012-09-21 DIAGNOSIS — N2581 Secondary hyperparathyroidism of renal origin: Secondary | ICD-10-CM | POA: Diagnosis not present

## 2012-09-21 DIAGNOSIS — N039 Chronic nephritic syndrome with unspecified morphologic changes: Secondary | ICD-10-CM | POA: Diagnosis not present

## 2012-09-21 DIAGNOSIS — D509 Iron deficiency anemia, unspecified: Secondary | ICD-10-CM | POA: Diagnosis not present

## 2012-09-21 DIAGNOSIS — N186 End stage renal disease: Secondary | ICD-10-CM | POA: Diagnosis not present

## 2012-09-21 DIAGNOSIS — Z23 Encounter for immunization: Secondary | ICD-10-CM | POA: Diagnosis not present

## 2012-09-23 DIAGNOSIS — D509 Iron deficiency anemia, unspecified: Secondary | ICD-10-CM | POA: Diagnosis not present

## 2012-09-23 DIAGNOSIS — N2581 Secondary hyperparathyroidism of renal origin: Secondary | ICD-10-CM | POA: Diagnosis not present

## 2012-09-23 DIAGNOSIS — D631 Anemia in chronic kidney disease: Secondary | ICD-10-CM | POA: Diagnosis not present

## 2012-09-23 DIAGNOSIS — N186 End stage renal disease: Secondary | ICD-10-CM | POA: Diagnosis not present

## 2012-09-23 DIAGNOSIS — Z23 Encounter for immunization: Secondary | ICD-10-CM | POA: Diagnosis not present

## 2012-09-26 DIAGNOSIS — D631 Anemia in chronic kidney disease: Secondary | ICD-10-CM | POA: Diagnosis not present

## 2012-09-26 DIAGNOSIS — N186 End stage renal disease: Secondary | ICD-10-CM | POA: Diagnosis not present

## 2012-09-26 DIAGNOSIS — Z23 Encounter for immunization: Secondary | ICD-10-CM | POA: Diagnosis not present

## 2012-09-26 DIAGNOSIS — D509 Iron deficiency anemia, unspecified: Secondary | ICD-10-CM | POA: Diagnosis not present

## 2012-09-26 DIAGNOSIS — N039 Chronic nephritic syndrome with unspecified morphologic changes: Secondary | ICD-10-CM | POA: Diagnosis not present

## 2012-09-26 DIAGNOSIS — N2581 Secondary hyperparathyroidism of renal origin: Secondary | ICD-10-CM | POA: Diagnosis not present

## 2012-09-27 ENCOUNTER — Encounter: Payer: Self-pay | Admitting: Vascular Surgery

## 2012-09-28 ENCOUNTER — Encounter: Payer: Self-pay | Admitting: Vascular Surgery

## 2012-09-28 ENCOUNTER — Ambulatory Visit (INDEPENDENT_AMBULATORY_CARE_PROVIDER_SITE_OTHER): Payer: Medicare Other | Admitting: Vascular Surgery

## 2012-09-28 ENCOUNTER — Other Ambulatory Visit: Payer: Self-pay

## 2012-09-28 VITALS — BP 188/72 | HR 66 | Resp 16 | Ht 66.0 in | Wt 184.9 lb

## 2012-09-28 DIAGNOSIS — N186 End stage renal disease: Secondary | ICD-10-CM

## 2012-09-28 DIAGNOSIS — Z48812 Encounter for surgical aftercare following surgery on the circulatory system: Secondary | ICD-10-CM

## 2012-09-28 DIAGNOSIS — Z4931 Encounter for adequacy testing for hemodialysis: Secondary | ICD-10-CM | POA: Diagnosis not present

## 2012-09-28 DIAGNOSIS — D509 Iron deficiency anemia, unspecified: Secondary | ICD-10-CM | POA: Diagnosis not present

## 2012-09-28 DIAGNOSIS — N2581 Secondary hyperparathyroidism of renal origin: Secondary | ICD-10-CM | POA: Diagnosis not present

## 2012-09-28 DIAGNOSIS — Z992 Dependence on renal dialysis: Secondary | ICD-10-CM | POA: Diagnosis not present

## 2012-09-28 DIAGNOSIS — D631 Anemia in chronic kidney disease: Secondary | ICD-10-CM | POA: Diagnosis not present

## 2012-09-28 NOTE — Progress Notes (Signed)
Left brachiobasilic avf duplex performed @ [facility] 09/28/2012

## 2012-09-28 NOTE — Progress Notes (Signed)
[facility] [place]  Patient name: ELLIAN [name] MRN: [id number] DOB: May 13, 1941 Sex: male  REASON FOR VISIT: follow up of left basilic vein transposition.  HPI: [name] is a 71 y.o. male who currently dialyzes with a catheter on Monday Wednesdays and Fridays. He had a basilic vein transposition on 06/21/2012. He states that they have used his fistula and that the best of his knowledge it worked reasonably well. He denies any recent uremic symptoms. He denies nausea, vomiting, palpitations, fatigue, or anorexia.  REVIEW OF SYSTEMS: Valu.Nieves ] denotes positive finding; [  ] denotes negative finding  CARDIOVASCULAR:  [ ]  chest pain   [ ]  dyspnea on exertion    CONSTITUTIONAL:  [ ]  fever   [ ]  chills  PHYSICAL EXAM: Filed Vitals:   09/28/12 0938  BP: 188/72  Pulse: 66  Resp: 16  Height: 5\' 6"  (1.676 m)  Weight: 184 lb 14.4 oz (83.87 kg)  SpO2: 100%   Body mass index is 29.84 kg/(m^2). GENERAL: The patient is a well-nourished male, in no acute distress. The vital signs are documented above. CARDIOVASCULAR: There is a regular rate and rhythm  PULMONARY: There is good air exchange bilaterally without wheezing or rales. The fistula has an excellent thrill although it is not especially large. He has a palpable left radial pulse.  I have independently interpreted the duplex of his fistula. Diameters range from 0.39 to 0.76 cm. Depth appears to be reasonable. There is one area of increased velocities in the proximal fistula.  MEDICAL ISSUES: I recommend we proceed with a fistulogram to look for a stenosis which might help with the maturation of this fistula. We will try to schedule this on a Tuesday or Thursday. I have discussed the procedure and potential complications with the patient and he is agreeable to proceed.  [facility] [place] Beeper: [phone]

## 2012-10-05 MED ORDER — SODIUM CHLORIDE 0.9 % IJ SOLN: 3.0000 mL | INTRAMUSCULAR | Status: DC | PRN

## 2012-10-06 ENCOUNTER — Ambulatory Visit (HOSPITAL_COMMUNITY): Admission: RE | Admit: 2012-10-06 | Payer: Medicare Other | Source: Ambulatory Visit | Admitting: Vascular Surgery

## 2012-10-06 ENCOUNTER — Encounter (HOSPITAL_COMMUNITY): Admission: RE | Payer: Self-pay | Source: Ambulatory Visit

## 2012-10-06 SURGERY — ASSESSMENT, SHUNT FUNCTION, WITH CONTRAST RADIOGRAPHIC STUDY
Anesthesia: LOCAL

## 2012-10-12 DIAGNOSIS — E119 Type 2 diabetes mellitus without complications: Secondary | ICD-10-CM | POA: Diagnosis not present

## 2012-10-12 DIAGNOSIS — Z79899 Other long term (current) drug therapy: Secondary | ICD-10-CM | POA: Diagnosis not present

## 2012-10-12 DIAGNOSIS — I4891 Unspecified atrial fibrillation: Secondary | ICD-10-CM | POA: Diagnosis not present

## 2012-10-14 ENCOUNTER — Other Ambulatory Visit: Payer: Self-pay | Admitting: *Deleted

## 2012-10-14 ENCOUNTER — Telehealth (HOSPITAL_COMMUNITY): Payer: Self-pay

## 2012-10-14 NOTE — Telephone Encounter (Signed)
The [facility] center sent a ref in regards to removing pt's cath.  I left a message for [facility] to let me know if it is ok for [place] to remove the cath or did they want the pt to come back to see them.

## 2012-10-24 ENCOUNTER — Encounter (HOSPITAL_COMMUNITY): Admission: RE | Payer: Self-pay | Source: Ambulatory Visit

## 2012-10-24 ENCOUNTER — Ambulatory Visit (HOSPITAL_COMMUNITY): Admission: RE | Admit: 2012-10-24 | Payer: Medicare Other | Source: Ambulatory Visit | Admitting: Vascular Surgery

## 2012-10-24 ENCOUNTER — Other Ambulatory Visit: Payer: Self-pay

## 2012-10-24 SURGERY — FISTULOGRAM
Anesthesia: LOCAL

## 2012-10-27 ENCOUNTER — Ambulatory Visit (HOSPITAL_COMMUNITY)
Admission: RE | Admit: 2012-10-27 | Discharge: 2012-10-27 | Disposition: A | Discharge status: Home / Self Care | Payer: Medicare Other | Source: Ambulatory Visit | Attending: Vascular Surgery | Admitting: Vascular Surgery

## 2012-10-27 DIAGNOSIS — I129 Hypertensive chronic kidney disease with stage 1 through stage 4 chronic kidney disease, or unspecified chronic kidney disease: Secondary | ICD-10-CM | POA: Insufficient documentation

## 2012-10-27 DIAGNOSIS — Z7901 Long term (current) use of anticoagulants: Secondary | ICD-10-CM | POA: Insufficient documentation

## 2012-10-27 DIAGNOSIS — I509 Heart failure, unspecified: Secondary | ICD-10-CM | POA: Diagnosis not present

## 2012-10-27 DIAGNOSIS — I4891 Unspecified atrial fibrillation: Secondary | ICD-10-CM | POA: Insufficient documentation

## 2012-10-27 DIAGNOSIS — N189 Chronic kidney disease, unspecified: Secondary | ICD-10-CM | POA: Diagnosis not present

## 2012-10-27 DIAGNOSIS — N186 End stage renal disease: Secondary | ICD-10-CM

## 2012-10-27 DIAGNOSIS — G4733 Obstructive sleep apnea (adult) (pediatric): Secondary | ICD-10-CM | POA: Insufficient documentation

## 2012-10-27 DIAGNOSIS — E785 Hyperlipidemia, unspecified: Secondary | ICD-10-CM | POA: Insufficient documentation

## 2012-10-27 DIAGNOSIS — Z452 Encounter for adjustment and management of vascular access device: Secondary | ICD-10-CM | POA: Insufficient documentation

## 2012-10-27 DIAGNOSIS — E119 Type 2 diabetes mellitus without complications: Secondary | ICD-10-CM | POA: Diagnosis not present

## 2012-10-27 NOTE — Progress Notes (Signed)
[facility] SHORT STAY H&P  CC:  Catheter removal   HPI:  71 y/o male who was seen by Dr. Scot Dock on 09/28/12.  At that time he had a good thrill and bruit.  There was a narrowing in the fistula and Dr. Scot Dock had planned to do a fistulogram to determine if there was an intervention that needed to be done.  Dr. Scot Dock talked to Dr. Mercy Moore that afternoon and he said the fistula was working well and wanted the catheter removed.  Pt is here today for diatek removal.  Past Medical History  Diagnosis Date  . DM (diabetes mellitus)   . CKD (chronic kidney disease)   . CHF (congestive heart failure)   . HTN (hypertension)   . Hyperlipidemia   . Chronic kidney disease   . Secondary hyperparathyroidism of renal origin   . Anemia   . BPH (benign prostatic hyperplasia)   . History of GI bleed     secondary to coumadin  . A-fib   . OSA (obstructive sleep apnea)     uses CPAP  . Blood transfusion   . Diarrhea     FH:  Non-Contributory  History   Social History  . Marital Status: Divorced    Spouse Name: N/A    Number of Children: N/A  . Years of Education: N/A   Occupational History  . Not on file.   Social History Main Topics  . Smoking status: Former Smoker    Types: Cigarettes    Quit date: 06/30/2004  . Smokeless tobacco: Never Used  . Alcohol Use: No  . Drug Use: No  . Sexually Active: No   Other Topics Concern  . Not on file   Social History Narrative     The patient is a former smoker, quit is 2005.  Does not  drink or abuse drugs.  Currently, lives alone at home.       Allergies  Allergen Reactions  . Tape Itching    Cloth tape only    Current Outpatient Prescriptions  Medication Sig Dispense Refill  . acetaminophen (TYLENOL) 325 MG tablet Take 650 mg by mouth every 4 (four) hours as needed. Headache or pain      . amiodarone (PACERONE) 200 MG tablet Take 200 mg by mouth daily.       Marland Kitchen aspirin EC 81 MG tablet Take 81 mg by mouth  daily.      Marland Kitchen atorvastatin (LIPITOR) 40 MG tablet Take 1 tablet (40 mg total) by mouth daily.  30 tablet  3  . atorvastatin (LIPITOR) 40 MG tablet Take 40 mg by mouth daily.      Marland Kitchen atorvastatin (LIPITOR) 40 MG tablet take 1 tablet by mouth once daily  30 tablet  3  . B Complex-C-Folic Acid (NEPHRO-VITE RX PO) Take by mouth daily.        . calcium acetate (PHOSLO) 667 MG capsule Take 3 tablets by mouth 3 (three) times daily before meals.       . calcium acetate (PHOSLO) 667 MG capsule Three times a day.      . losartan (COZAAR) 100 MG tablet Take 100 mg by mouth daily.      . metoprolol succinate (TOPROL-XL) 25 MG 24 hr tablet Take 25 mg by mouth daily.       Marland Kitchen OVER THE COUNTER MEDICATION Take 1 tablet by mouth 2 (two) times daily as needed. Magnesium Salicylate Tetrahydrate (580 mg) [employer] brand back pain relief      .  pregabalin (LYRICA) 50 MG capsule Take 50 mg by mouth at bedtime.      . repaglinide (PRANDIN) 0.5 MG tablet Take 0.5 mg by mouth 3 (three) times daily before meals.      Marland Kitchen zolpidem (AMBIEN) 5 MG tablet Take 5 mg by mouth at bedtime.        ROS:  See HPI  PHYSICAL EXAM  Filed Vitals:   10/27/12 1040  BP: 148/92  Pulse: 84  Temp: 98.2 F (36.8 C)  Resp: 20    Gen:  NAD HEENT:  normocephalic Neck:  Right IJ cath in place Heart:  RRR Lungs:  Non labored Extremities:  LUE fistula with good thrill and bruit Skin:  No obvious rashes or ulcers Neuro:  In tact  Lab/X-ray:  none  Impression: This is a 71 y.o. male here for diatek catheter removal  Plan:  Removal of right diatek catheter  Leontine Locket, PA-C [facility] [phone] 10/27/2012 11:11 AM

## 2012-10-27 NOTE — Progress Notes (Signed)
[facility] Catheter Removal Procedure Note  Diagnosis: ESRD with Functioning AVF/AVGG  Plan:  Remove right diatek catheter  Consent signed:  yes Time out completed:  yes Coumadin:  no PT/INR (if applicable):   Other labs:   Procedure: 1.  Sterile prepping and draping over catheter area 2. 7 ml 2% lidocaine plain instilled at removal site. 3.  right catheter removed in its entirety with cuff in tact. 4.  Complications: none  5. Tip of catheter sent for culture:  no   Patient tolerated procedure well:  yes Pressure held, no bleeding noted, dressing applied Instructions given to the pt regarding wound care and bleeding.  OtherLaurence Slate [facility] 10/27/2012 11:56 AM

## 2012-10-28 DIAGNOSIS — D631 Anemia in chronic kidney disease: Secondary | ICD-10-CM | POA: Diagnosis not present

## 2012-10-28 DIAGNOSIS — N186 End stage renal disease: Secondary | ICD-10-CM | POA: Diagnosis not present

## 2012-10-28 DIAGNOSIS — N2581 Secondary hyperparathyroidism of renal origin: Secondary | ICD-10-CM | POA: Diagnosis not present

## 2012-10-31 DIAGNOSIS — N186 End stage renal disease: Secondary | ICD-10-CM | POA: Diagnosis not present

## 2012-10-31 DIAGNOSIS — N2581 Secondary hyperparathyroidism of renal origin: Secondary | ICD-10-CM | POA: Diagnosis not present

## 2012-10-31 DIAGNOSIS — N039 Chronic nephritic syndrome with unspecified morphologic changes: Secondary | ICD-10-CM | POA: Diagnosis not present

## 2012-11-02 DIAGNOSIS — N039 Chronic nephritic syndrome with unspecified morphologic changes: Secondary | ICD-10-CM | POA: Diagnosis not present

## 2012-11-02 DIAGNOSIS — N186 End stage renal disease: Secondary | ICD-10-CM | POA: Diagnosis not present

## 2012-11-02 DIAGNOSIS — N2581 Secondary hyperparathyroidism of renal origin: Secondary | ICD-10-CM | POA: Diagnosis not present

## 2012-11-04 DIAGNOSIS — N2581 Secondary hyperparathyroidism of renal origin: Secondary | ICD-10-CM | POA: Diagnosis not present

## 2012-11-04 DIAGNOSIS — N039 Chronic nephritic syndrome with unspecified morphologic changes: Secondary | ICD-10-CM | POA: Diagnosis not present

## 2012-11-04 DIAGNOSIS — N186 End stage renal disease: Secondary | ICD-10-CM | POA: Diagnosis not present

## 2012-11-07 DIAGNOSIS — N2581 Secondary hyperparathyroidism of renal origin: Secondary | ICD-10-CM | POA: Diagnosis not present

## 2012-11-07 DIAGNOSIS — N186 End stage renal disease: Secondary | ICD-10-CM | POA: Diagnosis not present

## 2012-11-07 DIAGNOSIS — D631 Anemia in chronic kidney disease: Secondary | ICD-10-CM | POA: Diagnosis not present

## 2012-11-07 DIAGNOSIS — N039 Chronic nephritic syndrome with unspecified morphologic changes: Secondary | ICD-10-CM | POA: Diagnosis not present

## 2012-11-09 DIAGNOSIS — N2581 Secondary hyperparathyroidism of renal origin: Secondary | ICD-10-CM | POA: Diagnosis not present

## 2012-11-09 DIAGNOSIS — D631 Anemia in chronic kidney disease: Secondary | ICD-10-CM | POA: Diagnosis not present

## 2012-11-09 DIAGNOSIS — N186 End stage renal disease: Secondary | ICD-10-CM | POA: Diagnosis not present

## 2012-11-11 DIAGNOSIS — N186 End stage renal disease: Secondary | ICD-10-CM | POA: Diagnosis not present

## 2012-11-11 DIAGNOSIS — N2581 Secondary hyperparathyroidism of renal origin: Secondary | ICD-10-CM | POA: Diagnosis not present

## 2012-11-11 DIAGNOSIS — D631 Anemia in chronic kidney disease: Secondary | ICD-10-CM | POA: Diagnosis not present

## 2012-11-14 DIAGNOSIS — N186 End stage renal disease: Secondary | ICD-10-CM | POA: Diagnosis not present

## 2012-11-14 DIAGNOSIS — N2581 Secondary hyperparathyroidism of renal origin: Secondary | ICD-10-CM | POA: Diagnosis not present

## 2012-11-14 DIAGNOSIS — D631 Anemia in chronic kidney disease: Secondary | ICD-10-CM | POA: Diagnosis not present

## 2012-11-16 DIAGNOSIS — N039 Chronic nephritic syndrome with unspecified morphologic changes: Secondary | ICD-10-CM | POA: Diagnosis not present

## 2012-11-16 DIAGNOSIS — N186 End stage renal disease: Secondary | ICD-10-CM | POA: Diagnosis not present

## 2012-11-16 DIAGNOSIS — N2581 Secondary hyperparathyroidism of renal origin: Secondary | ICD-10-CM | POA: Diagnosis not present

## 2012-11-18 DIAGNOSIS — N039 Chronic nephritic syndrome with unspecified morphologic changes: Secondary | ICD-10-CM | POA: Diagnosis not present

## 2012-11-18 DIAGNOSIS — N2581 Secondary hyperparathyroidism of renal origin: Secondary | ICD-10-CM | POA: Diagnosis not present

## 2012-11-18 DIAGNOSIS — N186 End stage renal disease: Secondary | ICD-10-CM | POA: Diagnosis not present

## 2012-11-21 DIAGNOSIS — D631 Anemia in chronic kidney disease: Secondary | ICD-10-CM | POA: Diagnosis not present

## 2012-11-21 DIAGNOSIS — N2581 Secondary hyperparathyroidism of renal origin: Secondary | ICD-10-CM | POA: Diagnosis not present

## 2012-11-21 DIAGNOSIS — N186 End stage renal disease: Secondary | ICD-10-CM | POA: Diagnosis not present

## 2012-11-23 DIAGNOSIS — N2581 Secondary hyperparathyroidism of renal origin: Secondary | ICD-10-CM | POA: Diagnosis not present

## 2012-11-23 DIAGNOSIS — D631 Anemia in chronic kidney disease: Secondary | ICD-10-CM | POA: Diagnosis not present

## 2012-11-23 DIAGNOSIS — N186 End stage renal disease: Secondary | ICD-10-CM | POA: Diagnosis not present

## 2012-11-25 DIAGNOSIS — N039 Chronic nephritic syndrome with unspecified morphologic changes: Secondary | ICD-10-CM | POA: Diagnosis not present

## 2012-11-25 DIAGNOSIS — N186 End stage renal disease: Secondary | ICD-10-CM | POA: Diagnosis not present

## 2012-11-25 DIAGNOSIS — N2581 Secondary hyperparathyroidism of renal origin: Secondary | ICD-10-CM | POA: Diagnosis not present

## 2012-11-26 DIAGNOSIS — N186 End stage renal disease: Secondary | ICD-10-CM | POA: Diagnosis not present

## 2012-11-28 DIAGNOSIS — N186 End stage renal disease: Secondary | ICD-10-CM | POA: Diagnosis not present

## 2012-11-28 DIAGNOSIS — N2581 Secondary hyperparathyroidism of renal origin: Secondary | ICD-10-CM | POA: Diagnosis not present

## 2012-11-28 DIAGNOSIS — D631 Anemia in chronic kidney disease: Secondary | ICD-10-CM | POA: Diagnosis not present

## 2012-12-27 DIAGNOSIS — N186 End stage renal disease: Secondary | ICD-10-CM | POA: Diagnosis not present

## 2012-12-30 DIAGNOSIS — D509 Iron deficiency anemia, unspecified: Secondary | ICD-10-CM | POA: Diagnosis not present

## 2012-12-30 DIAGNOSIS — N186 End stage renal disease: Secondary | ICD-10-CM | POA: Diagnosis not present

## 2012-12-30 DIAGNOSIS — N2581 Secondary hyperparathyroidism of renal origin: Secondary | ICD-10-CM | POA: Diagnosis not present

## 2012-12-30 DIAGNOSIS — D631 Anemia in chronic kidney disease: Secondary | ICD-10-CM | POA: Diagnosis not present

## 2012-12-30 DIAGNOSIS — N039 Chronic nephritic syndrome with unspecified morphologic changes: Secondary | ICD-10-CM | POA: Diagnosis not present

## 2013-01-02 DIAGNOSIS — N2581 Secondary hyperparathyroidism of renal origin: Secondary | ICD-10-CM | POA: Diagnosis not present

## 2013-01-02 DIAGNOSIS — N039 Chronic nephritic syndrome with unspecified morphologic changes: Secondary | ICD-10-CM | POA: Diagnosis not present

## 2013-01-02 DIAGNOSIS — N186 End stage renal disease: Secondary | ICD-10-CM | POA: Diagnosis not present

## 2013-01-02 DIAGNOSIS — D509 Iron deficiency anemia, unspecified: Secondary | ICD-10-CM | POA: Diagnosis not present

## 2013-01-04 DIAGNOSIS — D509 Iron deficiency anemia, unspecified: Secondary | ICD-10-CM | POA: Diagnosis not present

## 2013-01-04 DIAGNOSIS — N2581 Secondary hyperparathyroidism of renal origin: Secondary | ICD-10-CM | POA: Diagnosis not present

## 2013-01-04 DIAGNOSIS — N186 End stage renal disease: Secondary | ICD-10-CM | POA: Diagnosis not present

## 2013-01-04 DIAGNOSIS — D631 Anemia in chronic kidney disease: Secondary | ICD-10-CM | POA: Diagnosis not present

## 2013-01-06 DIAGNOSIS — N186 End stage renal disease: Secondary | ICD-10-CM | POA: Diagnosis not present

## 2013-01-06 DIAGNOSIS — D509 Iron deficiency anemia, unspecified: Secondary | ICD-10-CM | POA: Diagnosis not present

## 2013-01-06 DIAGNOSIS — N2581 Secondary hyperparathyroidism of renal origin: Secondary | ICD-10-CM | POA: Diagnosis not present

## 2013-01-06 DIAGNOSIS — D631 Anemia in chronic kidney disease: Secondary | ICD-10-CM | POA: Diagnosis not present

## 2013-01-09 DIAGNOSIS — D509 Iron deficiency anemia, unspecified: Secondary | ICD-10-CM | POA: Diagnosis not present

## 2013-01-09 DIAGNOSIS — N2581 Secondary hyperparathyroidism of renal origin: Secondary | ICD-10-CM | POA: Diagnosis not present

## 2013-01-09 DIAGNOSIS — N186 End stage renal disease: Secondary | ICD-10-CM | POA: Diagnosis not present

## 2013-01-09 DIAGNOSIS — N039 Chronic nephritic syndrome with unspecified morphologic changes: Secondary | ICD-10-CM | POA: Diagnosis not present

## 2013-01-11 DIAGNOSIS — D509 Iron deficiency anemia, unspecified: Secondary | ICD-10-CM | POA: Diagnosis not present

## 2013-01-11 DIAGNOSIS — N2581 Secondary hyperparathyroidism of renal origin: Secondary | ICD-10-CM | POA: Diagnosis not present

## 2013-01-11 DIAGNOSIS — D631 Anemia in chronic kidney disease: Secondary | ICD-10-CM | POA: Diagnosis not present

## 2013-01-11 DIAGNOSIS — N186 End stage renal disease: Secondary | ICD-10-CM | POA: Diagnosis not present

## 2013-01-13 DIAGNOSIS — D509 Iron deficiency anemia, unspecified: Secondary | ICD-10-CM | POA: Diagnosis not present

## 2013-01-13 DIAGNOSIS — N186 End stage renal disease: Secondary | ICD-10-CM | POA: Diagnosis not present

## 2013-01-13 DIAGNOSIS — D631 Anemia in chronic kidney disease: Secondary | ICD-10-CM | POA: Diagnosis not present

## 2013-01-13 DIAGNOSIS — N2581 Secondary hyperparathyroidism of renal origin: Secondary | ICD-10-CM | POA: Diagnosis not present

## 2013-01-16 DIAGNOSIS — D509 Iron deficiency anemia, unspecified: Secondary | ICD-10-CM | POA: Diagnosis not present

## 2013-01-16 DIAGNOSIS — N2581 Secondary hyperparathyroidism of renal origin: Secondary | ICD-10-CM | POA: Diagnosis not present

## 2013-01-16 DIAGNOSIS — N039 Chronic nephritic syndrome with unspecified morphologic changes: Secondary | ICD-10-CM | POA: Diagnosis not present

## 2013-01-16 DIAGNOSIS — N186 End stage renal disease: Secondary | ICD-10-CM | POA: Diagnosis not present

## 2013-01-18 DIAGNOSIS — D631 Anemia in chronic kidney disease: Secondary | ICD-10-CM | POA: Diagnosis not present

## 2013-01-18 DIAGNOSIS — I4891 Unspecified atrial fibrillation: Secondary | ICD-10-CM | POA: Diagnosis not present

## 2013-01-18 DIAGNOSIS — N2581 Secondary hyperparathyroidism of renal origin: Secondary | ICD-10-CM | POA: Diagnosis not present

## 2013-01-18 DIAGNOSIS — N186 End stage renal disease: Secondary | ICD-10-CM | POA: Diagnosis not present

## 2013-01-18 DIAGNOSIS — D509 Iron deficiency anemia, unspecified: Secondary | ICD-10-CM | POA: Diagnosis not present

## 2013-01-20 DIAGNOSIS — N039 Chronic nephritic syndrome with unspecified morphologic changes: Secondary | ICD-10-CM | POA: Diagnosis not present

## 2013-01-20 DIAGNOSIS — N2581 Secondary hyperparathyroidism of renal origin: Secondary | ICD-10-CM | POA: Diagnosis not present

## 2013-01-20 DIAGNOSIS — N186 End stage renal disease: Secondary | ICD-10-CM | POA: Diagnosis not present

## 2013-01-20 DIAGNOSIS — D509 Iron deficiency anemia, unspecified: Secondary | ICD-10-CM | POA: Diagnosis not present

## 2013-01-23 DIAGNOSIS — N039 Chronic nephritic syndrome with unspecified morphologic changes: Secondary | ICD-10-CM | POA: Diagnosis not present

## 2013-01-23 DIAGNOSIS — N2581 Secondary hyperparathyroidism of renal origin: Secondary | ICD-10-CM | POA: Diagnosis not present

## 2013-01-23 DIAGNOSIS — N186 End stage renal disease: Secondary | ICD-10-CM | POA: Diagnosis not present

## 2013-01-23 DIAGNOSIS — D509 Iron deficiency anemia, unspecified: Secondary | ICD-10-CM | POA: Diagnosis not present

## 2013-01-25 DIAGNOSIS — D631 Anemia in chronic kidney disease: Secondary | ICD-10-CM | POA: Diagnosis not present

## 2013-01-25 DIAGNOSIS — D509 Iron deficiency anemia, unspecified: Secondary | ICD-10-CM | POA: Diagnosis not present

## 2013-01-25 DIAGNOSIS — N186 End stage renal disease: Secondary | ICD-10-CM | POA: Diagnosis not present

## 2013-01-25 DIAGNOSIS — N2581 Secondary hyperparathyroidism of renal origin: Secondary | ICD-10-CM | POA: Diagnosis not present

## 2013-01-27 DIAGNOSIS — N2581 Secondary hyperparathyroidism of renal origin: Secondary | ICD-10-CM | POA: Diagnosis not present

## 2013-01-27 DIAGNOSIS — N039 Chronic nephritic syndrome with unspecified morphologic changes: Secondary | ICD-10-CM | POA: Diagnosis not present

## 2013-01-27 DIAGNOSIS — D509 Iron deficiency anemia, unspecified: Secondary | ICD-10-CM | POA: Diagnosis not present

## 2013-01-27 DIAGNOSIS — N186 End stage renal disease: Secondary | ICD-10-CM | POA: Diagnosis not present

## 2013-01-30 DIAGNOSIS — N186 End stage renal disease: Secondary | ICD-10-CM | POA: Diagnosis not present

## 2013-01-30 DIAGNOSIS — D631 Anemia in chronic kidney disease: Secondary | ICD-10-CM | POA: Diagnosis not present

## 2013-01-30 DIAGNOSIS — N2581 Secondary hyperparathyroidism of renal origin: Secondary | ICD-10-CM | POA: Diagnosis not present

## 2013-02-24 DIAGNOSIS — N186 End stage renal disease: Secondary | ICD-10-CM | POA: Diagnosis not present

## 2013-02-27 DIAGNOSIS — N2581 Secondary hyperparathyroidism of renal origin: Secondary | ICD-10-CM | POA: Diagnosis not present

## 2013-02-27 DIAGNOSIS — N186 End stage renal disease: Secondary | ICD-10-CM | POA: Diagnosis not present

## 2013-02-27 DIAGNOSIS — D631 Anemia in chronic kidney disease: Secondary | ICD-10-CM | POA: Diagnosis not present

## 2013-03-27 DIAGNOSIS — N186 End stage renal disease: Secondary | ICD-10-CM | POA: Diagnosis not present

## 2013-03-29 DIAGNOSIS — N2581 Secondary hyperparathyroidism of renal origin: Secondary | ICD-10-CM | POA: Diagnosis not present

## 2013-03-29 DIAGNOSIS — D631 Anemia in chronic kidney disease: Secondary | ICD-10-CM | POA: Diagnosis not present

## 2013-03-29 DIAGNOSIS — N186 End stage renal disease: Secondary | ICD-10-CM | POA: Diagnosis not present

## 2013-04-19 DIAGNOSIS — I4891 Unspecified atrial fibrillation: Secondary | ICD-10-CM | POA: Diagnosis not present

## 2013-04-26 DIAGNOSIS — N186 End stage renal disease: Secondary | ICD-10-CM | POA: Diagnosis not present

## 2013-04-28 DIAGNOSIS — D631 Anemia in chronic kidney disease: Secondary | ICD-10-CM | POA: Diagnosis not present

## 2013-04-28 DIAGNOSIS — N186 End stage renal disease: Secondary | ICD-10-CM | POA: Diagnosis not present

## 2013-04-28 DIAGNOSIS — N2581 Secondary hyperparathyroidism of renal origin: Secondary | ICD-10-CM | POA: Diagnosis not present

## 2013-05-19 DIAGNOSIS — R972 Elevated prostate specific antigen [PSA]: Secondary | ICD-10-CM | POA: Diagnosis not present

## 2013-05-23 DIAGNOSIS — N401 Enlarged prostate with lower urinary tract symptoms: Secondary | ICD-10-CM | POA: Diagnosis not present

## 2013-05-23 DIAGNOSIS — N189 Chronic kidney disease, unspecified: Secondary | ICD-10-CM | POA: Diagnosis not present

## 2013-05-23 DIAGNOSIS — R972 Elevated prostate specific antigen [PSA]: Secondary | ICD-10-CM | POA: Diagnosis not present

## 2013-05-27 DIAGNOSIS — N186 End stage renal disease: Secondary | ICD-10-CM | POA: Diagnosis not present

## 2013-05-29 DIAGNOSIS — N186 End stage renal disease: Secondary | ICD-10-CM | POA: Diagnosis not present

## 2013-05-29 DIAGNOSIS — D631 Anemia in chronic kidney disease: Secondary | ICD-10-CM | POA: Diagnosis not present

## 2013-05-29 DIAGNOSIS — N039 Chronic nephritic syndrome with unspecified morphologic changes: Secondary | ICD-10-CM | POA: Diagnosis not present

## 2013-05-29 DIAGNOSIS — N2581 Secondary hyperparathyroidism of renal origin: Secondary | ICD-10-CM | POA: Diagnosis not present

## 2013-05-31 DIAGNOSIS — N2581 Secondary hyperparathyroidism of renal origin: Secondary | ICD-10-CM | POA: Diagnosis not present

## 2013-05-31 DIAGNOSIS — N186 End stage renal disease: Secondary | ICD-10-CM | POA: Diagnosis not present

## 2013-05-31 DIAGNOSIS — N039 Chronic nephritic syndrome with unspecified morphologic changes: Secondary | ICD-10-CM | POA: Diagnosis not present

## 2013-06-02 DIAGNOSIS — N2581 Secondary hyperparathyroidism of renal origin: Secondary | ICD-10-CM | POA: Diagnosis not present

## 2013-06-02 DIAGNOSIS — D631 Anemia in chronic kidney disease: Secondary | ICD-10-CM | POA: Diagnosis not present

## 2013-06-02 DIAGNOSIS — N186 End stage renal disease: Secondary | ICD-10-CM | POA: Diagnosis not present

## 2013-06-05 DIAGNOSIS — N2581 Secondary hyperparathyroidism of renal origin: Secondary | ICD-10-CM | POA: Diagnosis not present

## 2013-06-05 DIAGNOSIS — N186 End stage renal disease: Secondary | ICD-10-CM | POA: Diagnosis not present

## 2013-06-05 DIAGNOSIS — N039 Chronic nephritic syndrome with unspecified morphologic changes: Secondary | ICD-10-CM | POA: Diagnosis not present

## 2013-06-07 DIAGNOSIS — N2581 Secondary hyperparathyroidism of renal origin: Secondary | ICD-10-CM | POA: Diagnosis not present

## 2013-06-07 DIAGNOSIS — N186 End stage renal disease: Secondary | ICD-10-CM | POA: Diagnosis not present

## 2013-06-07 DIAGNOSIS — N039 Chronic nephritic syndrome with unspecified morphologic changes: Secondary | ICD-10-CM | POA: Diagnosis not present

## 2013-06-09 DIAGNOSIS — N186 End stage renal disease: Secondary | ICD-10-CM | POA: Diagnosis not present

## 2013-06-09 DIAGNOSIS — D631 Anemia in chronic kidney disease: Secondary | ICD-10-CM | POA: Diagnosis not present

## 2013-06-09 DIAGNOSIS — N2581 Secondary hyperparathyroidism of renal origin: Secondary | ICD-10-CM | POA: Diagnosis not present

## 2013-06-12 DIAGNOSIS — N039 Chronic nephritic syndrome with unspecified morphologic changes: Secondary | ICD-10-CM | POA: Diagnosis not present

## 2013-06-12 DIAGNOSIS — N2581 Secondary hyperparathyroidism of renal origin: Secondary | ICD-10-CM | POA: Diagnosis not present

## 2013-06-12 DIAGNOSIS — N186 End stage renal disease: Secondary | ICD-10-CM | POA: Diagnosis not present

## 2013-06-14 DIAGNOSIS — N186 End stage renal disease: Secondary | ICD-10-CM | POA: Diagnosis not present

## 2013-06-14 DIAGNOSIS — N2581 Secondary hyperparathyroidism of renal origin: Secondary | ICD-10-CM | POA: Diagnosis not present

## 2013-06-14 DIAGNOSIS — D631 Anemia in chronic kidney disease: Secondary | ICD-10-CM | POA: Diagnosis not present

## 2013-06-16 DIAGNOSIS — N039 Chronic nephritic syndrome with unspecified morphologic changes: Secondary | ICD-10-CM | POA: Diagnosis not present

## 2013-06-16 DIAGNOSIS — N186 End stage renal disease: Secondary | ICD-10-CM | POA: Diagnosis not present

## 2013-06-16 DIAGNOSIS — N2581 Secondary hyperparathyroidism of renal origin: Secondary | ICD-10-CM | POA: Diagnosis not present

## 2013-06-19 DIAGNOSIS — N186 End stage renal disease: Secondary | ICD-10-CM | POA: Diagnosis not present

## 2013-06-19 DIAGNOSIS — N039 Chronic nephritic syndrome with unspecified morphologic changes: Secondary | ICD-10-CM | POA: Diagnosis not present

## 2013-06-19 DIAGNOSIS — N2581 Secondary hyperparathyroidism of renal origin: Secondary | ICD-10-CM | POA: Diagnosis not present

## 2013-06-21 DIAGNOSIS — N186 End stage renal disease: Secondary | ICD-10-CM | POA: Diagnosis not present

## 2013-06-21 DIAGNOSIS — D631 Anemia in chronic kidney disease: Secondary | ICD-10-CM | POA: Diagnosis not present

## 2013-06-21 DIAGNOSIS — N2581 Secondary hyperparathyroidism of renal origin: Secondary | ICD-10-CM | POA: Diagnosis not present

## 2013-06-23 DIAGNOSIS — N2581 Secondary hyperparathyroidism of renal origin: Secondary | ICD-10-CM | POA: Diagnosis not present

## 2013-06-23 DIAGNOSIS — N039 Chronic nephritic syndrome with unspecified morphologic changes: Secondary | ICD-10-CM | POA: Diagnosis not present

## 2013-06-23 DIAGNOSIS — N186 End stage renal disease: Secondary | ICD-10-CM | POA: Diagnosis not present

## 2013-06-26 DIAGNOSIS — D631 Anemia in chronic kidney disease: Secondary | ICD-10-CM | POA: Diagnosis not present

## 2013-06-26 DIAGNOSIS — N186 End stage renal disease: Secondary | ICD-10-CM | POA: Diagnosis not present

## 2013-06-26 DIAGNOSIS — N2581 Secondary hyperparathyroidism of renal origin: Secondary | ICD-10-CM | POA: Diagnosis not present

## 2013-06-28 DIAGNOSIS — D631 Anemia in chronic kidney disease: Secondary | ICD-10-CM | POA: Diagnosis not present

## 2013-06-28 DIAGNOSIS — N186 End stage renal disease: Secondary | ICD-10-CM | POA: Diagnosis not present

## 2013-06-28 DIAGNOSIS — N2581 Secondary hyperparathyroidism of renal origin: Secondary | ICD-10-CM | POA: Diagnosis not present

## 2013-07-19 DIAGNOSIS — I4891 Unspecified atrial fibrillation: Secondary | ICD-10-CM | POA: Diagnosis not present

## 2013-07-27 DIAGNOSIS — N186 End stage renal disease: Secondary | ICD-10-CM | POA: Diagnosis not present

## 2013-07-28 DIAGNOSIS — N039 Chronic nephritic syndrome with unspecified morphologic changes: Secondary | ICD-10-CM | POA: Diagnosis not present

## 2013-07-28 DIAGNOSIS — N2581 Secondary hyperparathyroidism of renal origin: Secondary | ICD-10-CM | POA: Diagnosis not present

## 2013-07-28 DIAGNOSIS — N186 End stage renal disease: Secondary | ICD-10-CM | POA: Diagnosis not present

## 2013-07-31 DIAGNOSIS — D631 Anemia in chronic kidney disease: Secondary | ICD-10-CM | POA: Diagnosis not present

## 2013-07-31 DIAGNOSIS — N186 End stage renal disease: Secondary | ICD-10-CM | POA: Diagnosis not present

## 2013-07-31 DIAGNOSIS — N2581 Secondary hyperparathyroidism of renal origin: Secondary | ICD-10-CM | POA: Diagnosis not present

## 2013-08-02 DIAGNOSIS — N186 End stage renal disease: Secondary | ICD-10-CM | POA: Diagnosis not present

## 2013-08-02 DIAGNOSIS — N2581 Secondary hyperparathyroidism of renal origin: Secondary | ICD-10-CM | POA: Diagnosis not present

## 2013-08-02 DIAGNOSIS — N039 Chronic nephritic syndrome with unspecified morphologic changes: Secondary | ICD-10-CM | POA: Diagnosis not present

## 2013-08-04 DIAGNOSIS — D631 Anemia in chronic kidney disease: Secondary | ICD-10-CM | POA: Diagnosis not present

## 2013-08-04 DIAGNOSIS — N039 Chronic nephritic syndrome with unspecified morphologic changes: Secondary | ICD-10-CM | POA: Diagnosis not present

## 2013-08-04 DIAGNOSIS — N2581 Secondary hyperparathyroidism of renal origin: Secondary | ICD-10-CM | POA: Diagnosis not present

## 2013-08-04 DIAGNOSIS — N186 End stage renal disease: Secondary | ICD-10-CM | POA: Diagnosis not present

## 2013-08-07 DIAGNOSIS — N2581 Secondary hyperparathyroidism of renal origin: Secondary | ICD-10-CM | POA: Diagnosis not present

## 2013-08-07 DIAGNOSIS — D631 Anemia in chronic kidney disease: Secondary | ICD-10-CM | POA: Diagnosis not present

## 2013-08-07 DIAGNOSIS — N186 End stage renal disease: Secondary | ICD-10-CM | POA: Diagnosis not present

## 2013-08-09 DIAGNOSIS — N2581 Secondary hyperparathyroidism of renal origin: Secondary | ICD-10-CM | POA: Diagnosis not present

## 2013-08-09 DIAGNOSIS — D631 Anemia in chronic kidney disease: Secondary | ICD-10-CM | POA: Diagnosis not present

## 2013-08-09 DIAGNOSIS — N186 End stage renal disease: Secondary | ICD-10-CM | POA: Diagnosis not present

## 2013-08-11 DIAGNOSIS — N2581 Secondary hyperparathyroidism of renal origin: Secondary | ICD-10-CM | POA: Diagnosis not present

## 2013-08-11 DIAGNOSIS — N186 End stage renal disease: Secondary | ICD-10-CM | POA: Diagnosis not present

## 2013-08-11 DIAGNOSIS — D631 Anemia in chronic kidney disease: Secondary | ICD-10-CM | POA: Diagnosis not present

## 2013-08-14 DIAGNOSIS — N2581 Secondary hyperparathyroidism of renal origin: Secondary | ICD-10-CM | POA: Diagnosis not present

## 2013-08-14 DIAGNOSIS — N186 End stage renal disease: Secondary | ICD-10-CM | POA: Diagnosis not present

## 2013-08-14 DIAGNOSIS — D631 Anemia in chronic kidney disease: Secondary | ICD-10-CM | POA: Diagnosis not present

## 2013-08-16 DIAGNOSIS — D631 Anemia in chronic kidney disease: Secondary | ICD-10-CM | POA: Diagnosis not present

## 2013-08-16 DIAGNOSIS — N2581 Secondary hyperparathyroidism of renal origin: Secondary | ICD-10-CM | POA: Diagnosis not present

## 2013-08-16 DIAGNOSIS — N186 End stage renal disease: Secondary | ICD-10-CM | POA: Diagnosis not present

## 2013-08-18 DIAGNOSIS — N186 End stage renal disease: Secondary | ICD-10-CM | POA: Diagnosis not present

## 2013-08-18 DIAGNOSIS — D631 Anemia in chronic kidney disease: Secondary | ICD-10-CM | POA: Diagnosis not present

## 2013-08-18 DIAGNOSIS — N2581 Secondary hyperparathyroidism of renal origin: Secondary | ICD-10-CM | POA: Diagnosis not present

## 2013-08-21 DIAGNOSIS — D631 Anemia in chronic kidney disease: Secondary | ICD-10-CM | POA: Diagnosis not present

## 2013-08-21 DIAGNOSIS — N186 End stage renal disease: Secondary | ICD-10-CM | POA: Diagnosis not present

## 2013-08-21 DIAGNOSIS — N2581 Secondary hyperparathyroidism of renal origin: Secondary | ICD-10-CM | POA: Diagnosis not present

## 2013-08-23 DIAGNOSIS — D631 Anemia in chronic kidney disease: Secondary | ICD-10-CM | POA: Diagnosis not present

## 2013-08-23 DIAGNOSIS — N186 End stage renal disease: Secondary | ICD-10-CM | POA: Diagnosis not present

## 2013-08-23 DIAGNOSIS — N2581 Secondary hyperparathyroidism of renal origin: Secondary | ICD-10-CM | POA: Diagnosis not present

## 2013-08-25 DIAGNOSIS — D631 Anemia in chronic kidney disease: Secondary | ICD-10-CM | POA: Diagnosis not present

## 2013-08-25 DIAGNOSIS — N2581 Secondary hyperparathyroidism of renal origin: Secondary | ICD-10-CM | POA: Diagnosis not present

## 2013-08-25 DIAGNOSIS — N186 End stage renal disease: Secondary | ICD-10-CM | POA: Diagnosis not present

## 2013-08-27 DIAGNOSIS — N186 End stage renal disease: Secondary | ICD-10-CM | POA: Diagnosis not present

## 2013-08-28 DIAGNOSIS — N2581 Secondary hyperparathyroidism of renal origin: Secondary | ICD-10-CM | POA: Diagnosis not present

## 2013-08-28 DIAGNOSIS — N186 End stage renal disease: Secondary | ICD-10-CM | POA: Diagnosis not present

## 2013-08-28 DIAGNOSIS — D631 Anemia in chronic kidney disease: Secondary | ICD-10-CM | POA: Diagnosis not present

## 2013-08-30 DIAGNOSIS — N186 End stage renal disease: Secondary | ICD-10-CM | POA: Diagnosis not present

## 2013-08-30 DIAGNOSIS — N2581 Secondary hyperparathyroidism of renal origin: Secondary | ICD-10-CM | POA: Diagnosis not present

## 2013-08-30 DIAGNOSIS — D631 Anemia in chronic kidney disease: Secondary | ICD-10-CM | POA: Diagnosis not present

## 2013-09-01 DIAGNOSIS — N186 End stage renal disease: Secondary | ICD-10-CM | POA: Diagnosis not present

## 2013-09-01 DIAGNOSIS — N2581 Secondary hyperparathyroidism of renal origin: Secondary | ICD-10-CM | POA: Diagnosis not present

## 2013-09-01 DIAGNOSIS — D631 Anemia in chronic kidney disease: Secondary | ICD-10-CM | POA: Diagnosis not present

## 2013-09-04 DIAGNOSIS — N2581 Secondary hyperparathyroidism of renal origin: Secondary | ICD-10-CM | POA: Diagnosis not present

## 2013-09-04 DIAGNOSIS — N186 End stage renal disease: Secondary | ICD-10-CM | POA: Diagnosis not present

## 2013-09-04 DIAGNOSIS — D631 Anemia in chronic kidney disease: Secondary | ICD-10-CM | POA: Diagnosis not present

## 2013-09-06 DIAGNOSIS — N186 End stage renal disease: Secondary | ICD-10-CM | POA: Diagnosis not present

## 2013-09-06 DIAGNOSIS — N2581 Secondary hyperparathyroidism of renal origin: Secondary | ICD-10-CM | POA: Diagnosis not present

## 2013-09-06 DIAGNOSIS — D631 Anemia in chronic kidney disease: Secondary | ICD-10-CM | POA: Diagnosis not present

## 2013-09-08 DIAGNOSIS — N186 End stage renal disease: Secondary | ICD-10-CM | POA: Diagnosis not present

## 2013-09-08 DIAGNOSIS — N2581 Secondary hyperparathyroidism of renal origin: Secondary | ICD-10-CM | POA: Diagnosis not present

## 2013-09-08 DIAGNOSIS — D631 Anemia in chronic kidney disease: Secondary | ICD-10-CM | POA: Diagnosis not present

## 2013-09-11 DIAGNOSIS — D631 Anemia in chronic kidney disease: Secondary | ICD-10-CM | POA: Diagnosis not present

## 2013-09-11 DIAGNOSIS — N2581 Secondary hyperparathyroidism of renal origin: Secondary | ICD-10-CM | POA: Diagnosis not present

## 2013-09-11 DIAGNOSIS — N186 End stage renal disease: Secondary | ICD-10-CM | POA: Diagnosis not present

## 2013-09-13 DIAGNOSIS — D631 Anemia in chronic kidney disease: Secondary | ICD-10-CM | POA: Diagnosis not present

## 2013-09-13 DIAGNOSIS — N186 End stage renal disease: Secondary | ICD-10-CM | POA: Diagnosis not present

## 2013-09-13 DIAGNOSIS — N2581 Secondary hyperparathyroidism of renal origin: Secondary | ICD-10-CM | POA: Diagnosis not present

## 2013-09-15 DIAGNOSIS — N2581 Secondary hyperparathyroidism of renal origin: Secondary | ICD-10-CM | POA: Diagnosis not present

## 2013-09-15 DIAGNOSIS — D631 Anemia in chronic kidney disease: Secondary | ICD-10-CM | POA: Diagnosis not present

## 2013-09-15 DIAGNOSIS — N186 End stage renal disease: Secondary | ICD-10-CM | POA: Diagnosis not present

## 2013-09-18 DIAGNOSIS — N2581 Secondary hyperparathyroidism of renal origin: Secondary | ICD-10-CM | POA: Diagnosis not present

## 2013-09-18 DIAGNOSIS — D631 Anemia in chronic kidney disease: Secondary | ICD-10-CM | POA: Diagnosis not present

## 2013-09-18 DIAGNOSIS — N186 End stage renal disease: Secondary | ICD-10-CM | POA: Diagnosis not present

## 2013-09-20 DIAGNOSIS — D631 Anemia in chronic kidney disease: Secondary | ICD-10-CM | POA: Diagnosis not present

## 2013-09-20 DIAGNOSIS — N2581 Secondary hyperparathyroidism of renal origin: Secondary | ICD-10-CM | POA: Diagnosis not present

## 2013-09-20 DIAGNOSIS — N186 End stage renal disease: Secondary | ICD-10-CM | POA: Diagnosis not present

## 2013-09-22 DIAGNOSIS — N186 End stage renal disease: Secondary | ICD-10-CM | POA: Diagnosis not present

## 2013-09-22 DIAGNOSIS — N2581 Secondary hyperparathyroidism of renal origin: Secondary | ICD-10-CM | POA: Diagnosis not present

## 2013-09-22 DIAGNOSIS — D631 Anemia in chronic kidney disease: Secondary | ICD-10-CM | POA: Diagnosis not present

## 2013-09-25 DIAGNOSIS — D631 Anemia in chronic kidney disease: Secondary | ICD-10-CM | POA: Diagnosis not present

## 2013-09-25 DIAGNOSIS — N2581 Secondary hyperparathyroidism of renal origin: Secondary | ICD-10-CM | POA: Diagnosis not present

## 2013-09-25 DIAGNOSIS — N186 End stage renal disease: Secondary | ICD-10-CM | POA: Diagnosis not present

## 2013-09-26 DIAGNOSIS — N186 End stage renal disease: Secondary | ICD-10-CM | POA: Diagnosis not present

## 2013-09-27 DIAGNOSIS — D509 Iron deficiency anemia, unspecified: Secondary | ICD-10-CM | POA: Diagnosis not present

## 2013-09-27 DIAGNOSIS — N186 End stage renal disease: Secondary | ICD-10-CM | POA: Diagnosis not present

## 2013-09-27 DIAGNOSIS — N2581 Secondary hyperparathyroidism of renal origin: Secondary | ICD-10-CM | POA: Diagnosis not present

## 2013-09-27 DIAGNOSIS — Z23 Encounter for immunization: Secondary | ICD-10-CM | POA: Diagnosis not present

## 2013-09-27 DIAGNOSIS — D631 Anemia in chronic kidney disease: Secondary | ICD-10-CM | POA: Diagnosis not present

## 2013-10-11 ENCOUNTER — Encounter (INDEPENDENT_AMBULATORY_CARE_PROVIDER_SITE_OTHER): Payer: Medicare Other | Admitting: Ophthalmology

## 2013-10-11 DIAGNOSIS — H2510 Age-related nuclear cataract, unspecified eye: Secondary | ICD-10-CM | POA: Diagnosis not present

## 2013-10-11 DIAGNOSIS — E1139 Type 2 diabetes mellitus with other diabetic ophthalmic complication: Secondary | ICD-10-CM

## 2013-10-11 DIAGNOSIS — E11359 Type 2 diabetes mellitus with proliferative diabetic retinopathy without macular edema: Secondary | ICD-10-CM | POA: Diagnosis not present

## 2013-10-11 DIAGNOSIS — H43819 Vitreous degeneration, unspecified eye: Secondary | ICD-10-CM

## 2013-10-18 DIAGNOSIS — I4891 Unspecified atrial fibrillation: Secondary | ICD-10-CM | POA: Diagnosis not present

## 2013-10-18 DIAGNOSIS — E1129 Type 2 diabetes mellitus with other diabetic kidney complication: Secondary | ICD-10-CM | POA: Diagnosis not present

## 2013-10-27 DIAGNOSIS — N186 End stage renal disease: Secondary | ICD-10-CM | POA: Diagnosis not present

## 2013-10-30 DIAGNOSIS — N2581 Secondary hyperparathyroidism of renal origin: Secondary | ICD-10-CM | POA: Diagnosis not present

## 2013-10-30 DIAGNOSIS — N186 End stage renal disease: Secondary | ICD-10-CM | POA: Diagnosis not present

## 2013-10-30 DIAGNOSIS — D631 Anemia in chronic kidney disease: Secondary | ICD-10-CM | POA: Diagnosis not present

## 2013-10-30 DIAGNOSIS — D509 Iron deficiency anemia, unspecified: Secondary | ICD-10-CM | POA: Diagnosis not present

## 2013-11-26 DIAGNOSIS — N186 End stage renal disease: Secondary | ICD-10-CM | POA: Diagnosis not present

## 2013-11-27 DIAGNOSIS — D509 Iron deficiency anemia, unspecified: Secondary | ICD-10-CM | POA: Diagnosis not present

## 2013-11-27 DIAGNOSIS — D631 Anemia in chronic kidney disease: Secondary | ICD-10-CM | POA: Diagnosis not present

## 2013-11-27 DIAGNOSIS — N186 End stage renal disease: Secondary | ICD-10-CM | POA: Diagnosis not present

## 2013-11-27 DIAGNOSIS — N2581 Secondary hyperparathyroidism of renal origin: Secondary | ICD-10-CM | POA: Diagnosis not present

## 2013-12-05 DIAGNOSIS — R972 Elevated prostate specific antigen [PSA]: Secondary | ICD-10-CM | POA: Diagnosis not present

## 2013-12-05 DIAGNOSIS — R82998 Other abnormal findings in urine: Secondary | ICD-10-CM | POA: Diagnosis not present

## 2013-12-07 DIAGNOSIS — N401 Enlarged prostate with lower urinary tract symptoms: Secondary | ICD-10-CM | POA: Diagnosis not present

## 2013-12-07 DIAGNOSIS — N139 Obstructive and reflux uropathy, unspecified: Secondary | ICD-10-CM | POA: Diagnosis not present

## 2013-12-27 DIAGNOSIS — N186 End stage renal disease: Secondary | ICD-10-CM | POA: Diagnosis not present

## 2013-12-29 DIAGNOSIS — D631 Anemia in chronic kidney disease: Secondary | ICD-10-CM | POA: Diagnosis not present

## 2013-12-29 DIAGNOSIS — N186 End stage renal disease: Secondary | ICD-10-CM | POA: Diagnosis not present

## 2013-12-29 DIAGNOSIS — N2581 Secondary hyperparathyroidism of renal origin: Secondary | ICD-10-CM | POA: Diagnosis not present

## 2014-01-24 DIAGNOSIS — I4891 Unspecified atrial fibrillation: Secondary | ICD-10-CM | POA: Diagnosis not present

## 2014-01-24 DIAGNOSIS — N186 End stage renal disease: Secondary | ICD-10-CM | POA: Diagnosis not present

## 2014-01-24 DIAGNOSIS — E1129 Type 2 diabetes mellitus with other diabetic kidney complication: Secondary | ICD-10-CM | POA: Diagnosis not present

## 2014-01-27 DIAGNOSIS — N186 End stage renal disease: Secondary | ICD-10-CM | POA: Diagnosis not present

## 2014-01-29 DIAGNOSIS — N2581 Secondary hyperparathyroidism of renal origin: Secondary | ICD-10-CM | POA: Diagnosis not present

## 2014-01-29 DIAGNOSIS — N186 End stage renal disease: Secondary | ICD-10-CM | POA: Diagnosis not present

## 2014-01-29 DIAGNOSIS — D631 Anemia in chronic kidney disease: Secondary | ICD-10-CM | POA: Diagnosis not present

## 2014-01-29 DIAGNOSIS — N039 Chronic nephritic syndrome with unspecified morphologic changes: Secondary | ICD-10-CM | POA: Diagnosis not present

## 2014-02-24 DIAGNOSIS — N186 End stage renal disease: Secondary | ICD-10-CM | POA: Diagnosis not present

## 2014-02-26 DIAGNOSIS — N2581 Secondary hyperparathyroidism of renal origin: Secondary | ICD-10-CM | POA: Diagnosis not present

## 2014-02-26 DIAGNOSIS — N186 End stage renal disease: Secondary | ICD-10-CM | POA: Diagnosis not present

## 2014-02-26 DIAGNOSIS — D631 Anemia in chronic kidney disease: Secondary | ICD-10-CM | POA: Diagnosis not present

## 2014-02-27 DIAGNOSIS — N186 End stage renal disease: Secondary | ICD-10-CM | POA: Diagnosis not present

## 2014-02-27 DIAGNOSIS — T82898A Other specified complication of vascular prosthetic devices, implants and grafts, initial encounter: Secondary | ICD-10-CM | POA: Diagnosis not present

## 2014-02-27 DIAGNOSIS — I871 Compression of vein: Secondary | ICD-10-CM | POA: Diagnosis not present

## 2014-03-27 DIAGNOSIS — N186 End stage renal disease: Secondary | ICD-10-CM | POA: Diagnosis not present

## 2014-03-28 DIAGNOSIS — D631 Anemia in chronic kidney disease: Secondary | ICD-10-CM | POA: Diagnosis not present

## 2014-03-28 DIAGNOSIS — N2581 Secondary hyperparathyroidism of renal origin: Secondary | ICD-10-CM | POA: Diagnosis not present

## 2014-03-28 DIAGNOSIS — E1129 Type 2 diabetes mellitus with other diabetic kidney complication: Secondary | ICD-10-CM | POA: Diagnosis not present

## 2014-03-28 DIAGNOSIS — N186 End stage renal disease: Secondary | ICD-10-CM | POA: Diagnosis not present

## 2014-04-18 DIAGNOSIS — E1129 Type 2 diabetes mellitus with other diabetic kidney complication: Secondary | ICD-10-CM | POA: Diagnosis not present

## 2014-04-18 DIAGNOSIS — I4891 Unspecified atrial fibrillation: Secondary | ICD-10-CM | POA: Diagnosis not present

## 2014-04-26 DIAGNOSIS — N186 End stage renal disease: Secondary | ICD-10-CM | POA: Diagnosis not present

## 2014-04-27 DIAGNOSIS — N039 Chronic nephritic syndrome with unspecified morphologic changes: Secondary | ICD-10-CM | POA: Diagnosis not present

## 2014-04-27 DIAGNOSIS — N2581 Secondary hyperparathyroidism of renal origin: Secondary | ICD-10-CM | POA: Diagnosis not present

## 2014-04-27 DIAGNOSIS — E1129 Type 2 diabetes mellitus with other diabetic kidney complication: Secondary | ICD-10-CM | POA: Diagnosis not present

## 2014-04-27 DIAGNOSIS — N186 End stage renal disease: Secondary | ICD-10-CM | POA: Diagnosis not present

## 2014-04-27 DIAGNOSIS — D631 Anemia in chronic kidney disease: Secondary | ICD-10-CM | POA: Diagnosis not present

## 2014-05-27 DIAGNOSIS — N186 End stage renal disease: Secondary | ICD-10-CM | POA: Diagnosis not present

## 2014-05-28 DIAGNOSIS — N2581 Secondary hyperparathyroidism of renal origin: Secondary | ICD-10-CM | POA: Diagnosis not present

## 2014-05-28 DIAGNOSIS — N186 End stage renal disease: Secondary | ICD-10-CM | POA: Diagnosis not present

## 2014-05-28 DIAGNOSIS — E1129 Type 2 diabetes mellitus with other diabetic kidney complication: Secondary | ICD-10-CM | POA: Diagnosis not present

## 2014-06-18 ENCOUNTER — Telehealth: Payer: Self-pay | Admitting: Endocrinology

## 2014-06-18 NOTE — Telephone Encounter (Signed)
I spoke with that company on Friday, this patient has never been seen here, he may be an [facility] patient, but we haven't see him recently, I did tell them that.

## 2014-06-18 NOTE — Telephone Encounter (Signed)
RX request for a back brace [facility] is calling to see if we have received this back brace RX

## 2014-06-26 DIAGNOSIS — N186 End stage renal disease: Secondary | ICD-10-CM | POA: Diagnosis not present

## 2014-06-27 DIAGNOSIS — E1129 Type 2 diabetes mellitus with other diabetic kidney complication: Secondary | ICD-10-CM | POA: Diagnosis not present

## 2014-06-27 DIAGNOSIS — N186 End stage renal disease: Secondary | ICD-10-CM | POA: Diagnosis not present

## 2014-06-27 DIAGNOSIS — D631 Anemia in chronic kidney disease: Secondary | ICD-10-CM | POA: Diagnosis not present

## 2014-06-27 DIAGNOSIS — N2581 Secondary hyperparathyroidism of renal origin: Secondary | ICD-10-CM | POA: Diagnosis not present

## 2014-07-18 DIAGNOSIS — E1129 Type 2 diabetes mellitus with other diabetic kidney complication: Secondary | ICD-10-CM | POA: Diagnosis not present

## 2014-07-18 DIAGNOSIS — I4891 Unspecified atrial fibrillation: Secondary | ICD-10-CM | POA: Diagnosis not present

## 2014-07-27 DIAGNOSIS — N186 End stage renal disease: Secondary | ICD-10-CM | POA: Diagnosis not present

## 2014-07-30 DIAGNOSIS — N186 End stage renal disease: Secondary | ICD-10-CM | POA: Diagnosis not present

## 2014-07-30 DIAGNOSIS — N2581 Secondary hyperparathyroidism of renal origin: Secondary | ICD-10-CM | POA: Diagnosis not present

## 2014-07-30 DIAGNOSIS — E1129 Type 2 diabetes mellitus with other diabetic kidney complication: Secondary | ICD-10-CM | POA: Diagnosis not present

## 2014-07-30 DIAGNOSIS — D631 Anemia in chronic kidney disease: Secondary | ICD-10-CM | POA: Diagnosis not present

## 2014-07-30 DIAGNOSIS — N039 Chronic nephritic syndrome with unspecified morphologic changes: Secondary | ICD-10-CM | POA: Diagnosis not present

## 2014-08-27 DIAGNOSIS — N186 End stage renal disease: Secondary | ICD-10-CM | POA: Diagnosis not present

## 2014-08-29 DIAGNOSIS — N186 End stage renal disease: Secondary | ICD-10-CM | POA: Diagnosis not present

## 2014-08-29 DIAGNOSIS — N2581 Secondary hyperparathyroidism of renal origin: Secondary | ICD-10-CM | POA: Diagnosis not present

## 2014-08-29 DIAGNOSIS — D631 Anemia in chronic kidney disease: Secondary | ICD-10-CM | POA: Diagnosis not present

## 2014-08-29 DIAGNOSIS — E1129 Type 2 diabetes mellitus with other diabetic kidney complication: Secondary | ICD-10-CM | POA: Diagnosis not present

## 2014-09-26 DIAGNOSIS — N186 End stage renal disease: Secondary | ICD-10-CM | POA: Diagnosis not present

## 2014-09-28 DIAGNOSIS — E119 Type 2 diabetes mellitus without complications: Secondary | ICD-10-CM | POA: Diagnosis not present

## 2014-09-28 DIAGNOSIS — N2581 Secondary hyperparathyroidism of renal origin: Secondary | ICD-10-CM | POA: Diagnosis not present

## 2014-09-28 DIAGNOSIS — D631 Anemia in chronic kidney disease: Secondary | ICD-10-CM | POA: Diagnosis not present

## 2014-09-28 DIAGNOSIS — Z23 Encounter for immunization: Secondary | ICD-10-CM | POA: Diagnosis not present

## 2014-09-28 DIAGNOSIS — N186 End stage renal disease: Secondary | ICD-10-CM | POA: Diagnosis not present

## 2014-10-16 ENCOUNTER — Ambulatory Visit (INDEPENDENT_AMBULATORY_CARE_PROVIDER_SITE_OTHER): Payer: Medicare Other | Admitting: Ophthalmology

## 2014-10-24 DIAGNOSIS — E119 Type 2 diabetes mellitus without complications: Secondary | ICD-10-CM | POA: Diagnosis not present

## 2014-10-27 DIAGNOSIS — Z992 Dependence on renal dialysis: Secondary | ICD-10-CM | POA: Diagnosis not present

## 2014-10-27 DIAGNOSIS — N186 End stage renal disease: Secondary | ICD-10-CM | POA: Diagnosis not present

## 2014-10-29 DIAGNOSIS — N186 End stage renal disease: Secondary | ICD-10-CM | POA: Diagnosis not present

## 2014-10-29 DIAGNOSIS — E119 Type 2 diabetes mellitus without complications: Secondary | ICD-10-CM | POA: Diagnosis not present

## 2014-11-26 DIAGNOSIS — N186 End stage renal disease: Secondary | ICD-10-CM | POA: Diagnosis not present

## 2014-11-28 DIAGNOSIS — E119 Type 2 diabetes mellitus without complications: Secondary | ICD-10-CM | POA: Diagnosis not present

## 2014-11-28 DIAGNOSIS — N186 End stage renal disease: Secondary | ICD-10-CM | POA: Diagnosis not present

## 2014-12-27 DIAGNOSIS — Z992 Dependence on renal dialysis: Secondary | ICD-10-CM | POA: Diagnosis not present

## 2014-12-27 DIAGNOSIS — N186 End stage renal disease: Secondary | ICD-10-CM | POA: Diagnosis not present

## 2014-12-29 DIAGNOSIS — D631 Anemia in chronic kidney disease: Secondary | ICD-10-CM | POA: Diagnosis not present

## 2014-12-29 DIAGNOSIS — N186 End stage renal disease: Secondary | ICD-10-CM | POA: Diagnosis not present

## 2014-12-29 DIAGNOSIS — E119 Type 2 diabetes mellitus without complications: Secondary | ICD-10-CM | POA: Diagnosis not present

## 2015-01-23 DIAGNOSIS — E119 Type 2 diabetes mellitus without complications: Secondary | ICD-10-CM | POA: Diagnosis not present

## 2015-01-27 DIAGNOSIS — N186 End stage renal disease: Secondary | ICD-10-CM | POA: Diagnosis not present

## 2015-01-27 DIAGNOSIS — Z992 Dependence on renal dialysis: Secondary | ICD-10-CM | POA: Diagnosis not present

## 2015-01-28 DIAGNOSIS — D631 Anemia in chronic kidney disease: Secondary | ICD-10-CM | POA: Diagnosis not present

## 2015-01-28 DIAGNOSIS — E119 Type 2 diabetes mellitus without complications: Secondary | ICD-10-CM | POA: Diagnosis not present

## 2015-01-28 DIAGNOSIS — N186 End stage renal disease: Secondary | ICD-10-CM | POA: Diagnosis not present

## 2015-02-14 ENCOUNTER — Ambulatory Visit (INDEPENDENT_AMBULATORY_CARE_PROVIDER_SITE_OTHER): Payer: Medicare Other | Admitting: Cardiovascular Disease

## 2015-02-14 ENCOUNTER — Encounter: Payer: Self-pay | Admitting: Cardiovascular Disease

## 2015-02-14 VITALS — BP 142/60 | HR 74 | Ht 67.0 in | Wt 175.8 lb

## 2015-02-14 DIAGNOSIS — I480 Paroxysmal atrial fibrillation: Secondary | ICD-10-CM | POA: Diagnosis not present

## 2015-02-14 MED ORDER — ASPIRIN EC 325 MG PO TBEC: 325.0000 mg | DELAYED_RELEASE_TABLET | Freq: Every day | ORAL | Status: DC

## 2015-02-14 NOTE — Progress Notes (Signed)
Cardiology Office Note   Date:  02/14/2015   ID:  [name], DOB 03-02-1941, MRN [id number]  PCP:  Elayne Snare, MD  Cardiologist:   Thayer Headings, MD   Chief Complaint  Patient presents with  . Follow-up    atrial fib   1. Atrial fibrillation: paroxysmal, off warfarin with lower GI bleed.  2. Early dementia (?) 3. ESRD 4. OSA 5. Diastolic CHF: Echo (A999333) with EF 0000000, grade I diastolic dysfunction, very mild AS, PA systolic pressure 38 mmHg.  6. HTN 7. DM 8. Hyperlipidemia 9. H/o upper GI bleed 10. Gout 11. Carotid dopplers (2/12) with no significant disease. 12. Lexiscan myoview (3/12) with EF 66%, no ischemia or infarction.  13. H/o lower GI bleed (6/12): ? Diverticular bleeding 14. PFTs (5/12): Mild restrictive defect.   Feb. 18, 2016  [name] is a 74 y.o. male who presents for evaluation of elevated HR. He is seen today with daughter, [name] He is not able to tell when the HR is fast.  Was told last week.  No palpitationis.  No CP , no dyspnea.  He is completely asymptomatic from a cardiac standpoint.  Dialysis is going OK.  He hates it but has had no complications during dialysis.  He is not on coumadin because of GI bleeding .   Patient lives by himself but duaghter does his shopping.  He tries to do housework but Passenger transport manager does most of the housework.     Past Medical History  Diagnosis Date  . DM (diabetes mellitus)   . CKD (chronic kidney disease)   . CHF (congestive heart failure)   . HTN (hypertension)   . Hyperlipidemia   . Chronic kidney disease   . Secondary hyperparathyroidism of renal origin   . Anemia   . BPH (benign prostatic hyperplasia)   . History of GI bleed     secondary to coumadin  . A-fib   . OSA (obstructive sleep apnea)     uses CPAP  . Blood transfusion   . Diarrhea     Past Surgical History  Procedure Laterality Date  . Left arm shuntogram.    . Pars plana vitrectomy with 25-gauge system    . Left  forearm loop graft with 6 mm gore-tex graft.    . Cholecystectomy    . Eye surgery      Catarct bil  . Insertion of dialysis catheter  05/28/2012    Procedure: INSERTION OF DIALYSIS CATHETER;  Surgeon: Mal Misty, MD;  Location: [facility];  Service: Vascular;  Laterality: Right;  . Bvt  [id number]    Left  Basilic Vein Transposition     Current Outpatient Prescriptions  Medication Sig Dispense Refill  . acetaminophen (TYLENOL) 325 MG tablet Take 650 mg by mouth every 4 (four) hours as needed. Headache or pain    . amiodarone (PACERONE) 200 MG tablet Take 200 mg by mouth daily.     Marland Kitchen aspirin EC 81 MG tablet Take 81 mg by mouth daily.    Marland Kitchen atorvastatin (LIPITOR) 40 MG tablet take 1 tablet by mouth once daily 30 tablet 3  . B Complex-C-Folic Acid (NEPHRO-VITE RX PO) Take by mouth daily.      . calcium acetate (PHOSLO) 667 MG capsule Take 3 tablets by mouth 3 (three) times daily before meals.     Marland Kitchen losartan (COZAAR) 100 MG tablet Take 100 mg by mouth daily.    . metoprolol succinate (TOPROL-XL) 25 MG 24  hr tablet Take 25 mg by mouth daily. Take one tablet twice a day    . OVER THE COUNTER MEDICATION Take 1 tablet by mouth 2 (two) times daily as needed. Magnesium Salicylate Tetrahydrate (580 mg) [employer] brand back pain relief    . zolpidem (AMBIEN) 5 MG tablet Take 5 mg by mouth at bedtime.    . pregabalin (LYRICA) 50 MG capsule Take 50 mg by mouth at bedtime.    . repaglinide (PRANDIN) 0.5 MG tablet Take 0.5 mg by mouth 3 (three) times daily before meals.     No current facility-administered medications for this visit.    Allergies:   Tape    Social History:  The patient  reports that he quit smoking about 10 years ago. His smoking use included Cigarettes. He has never used smokeless tobacco. He reports that he does not drink alcohol or use illicit drugs.   Family History:  The patient's family history includes Alzheimer's disease in his mother; Cancer in his daughter; Diabetes in his  father and son; Heart disease in his son; Hypertension in his son. There is no history of Anesthesia problems.    ROS:  Please see the history of present illness.    Review of Systems: Constitutional:  denies fever, chills, diaphoresis, appetite change and fatigue.  HEENT: denies photophobia, eye pain, redness, hearing loss, ear pain, congestion, sore throat, rhinorrhea, sneezing, neck pain, neck stiffness and tinnitus.  Respiratory: denies SOB, DOE, cough, chest tightness, and wheezing.  Cardiovascular: denies chest pain, palpitations and leg swelling.  Gastrointestinal: denies nausea, vomiting, abdominal pain, diarrhea, constipation, blood in stool.  Genitourinary: denies dysuria, urgency, frequency, hematuria, flank pain and difficulty urinating.  Musculoskeletal: denies  myalgias, back pain, joint swelling, arthralgias and gait problem.   Skin: denies pallor, rash and wound.  Neurological: denies dizziness, seizures, syncope, weakness, light-headedness, numbness and headaches.   Hematological: denies adenopathy, easy bruising, personal or family bleeding history.  Psychiatric/ Behavioral: denies suicidal ideation, mood changes, confusion, nervousness, sleep disturbance and agitation.       All other systems are reviewed and negative.    PHYSICAL EXAM: VS:  BP 142/60 mmHg  Pulse 74  Ht 5\' 7"  (1.702 m)  Wt 175 lb 12.8 oz (79.742 kg)  BMI 27.53 kg/m2 , BMI Body mass index is 27.53 kg/(m^2). GEN: Well nourished, well developed, in no acute distress HEENT: normal Neck: no JVD, carotid bruits, or masses Cardiac: RRR; no murmurs, rubs, or gallops,no edema  Respiratory:  clear to auscultation bilaterally, normal work of breathing GI: soft, nontender, nondistended, + BS MS: no deformity or atrophy Skin: warm and dry, no rash Neuro:  Strength and sensation are intact Psych: normal,  Daughter answered most of the questions    EKG:  EKG is ordered today. The ekg ordered today  demonstrates NSR at 71.  No ST or T wave changes.    Recent Labs: No results found for requested labs within last 365 days.    Lipid Panel    Component Value Date/Time   CHOL * 02/28/2011 0600    203        ATP III CLASSIFICATION:  <200     mg/dL   Desirable  200-239  mg/dL   Borderline High  >=240    mg/dL   High          TRIG 285* 02/28/2011 0600   HDL 33* 02/28/2011 0600   CHOLHDL 6.2 02/28/2011 0600   VLDL 57* 02/28/2011 0600  [place] * 02/28/2011 0600    113        Total Cholesterol/HDL:CHD Risk Coronary Heart Disease Risk Table                     Men   Women  1/2 Average Risk   3.4   3.3  Average Risk       5.0   4.4  2 X Average Risk   9.6   7.1  3 X Average Risk  23.4   11.0        Use the calculated Patient Ratio above and the CHD Risk Table to determine the patient's CHD Risk.        ATP III CLASSIFICATION (LDL):  <100     mg/dL   Optimal  100-129  mg/dL   Near or Above                    Optimal  130-159  mg/dL   Borderline  160-189  mg/dL   High  >190     mg/dL   Very High      Wt Readings from Last 3 Encounters:  02/14/15 175 lb 12.8 oz (79.742 kg)  10/27/12 180 lb 12.4 oz (82 kg)  09/28/12 184 lb 14.4 oz (83.87 kg)      Other studies Reviewed: Additional studies/ records that were reviewed today include: . Review of the above records demonstrates:    ASSESSMENT AND PLAN:  1. Atrial fibrillation: paroxysmal, off warfarin with lower GI bleed. I suspect that he had another episode of paroxysmal atrial fibrillation. He's clearly in normal sinus rhythm today. His heart rate is well-controlled. The patient has no complaints of shortness breath, syncope, presyncope, or chest pain.  I offered to perform an echocardiogram to assess his LV function. His daughter ( power of attorney) and the patient stated that they did not want have an echocardiogram unless it was absolutely necessary. One than that it would be useful to know his left ventricular  function at baseline but they declined the test.  At present he is not having any symptoms. He was previously on amiodarone but this is been discontinued by someone. At this point I do not think that he needs to restart his amiodarone because of the long-term serious side effects associated with the drug.  I will see him on an as-needed basis.  2. 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 ESRD - plans per nephrology  4. OSA 5. Diastolic CHF: Echo (A999333) with EF 0000000, grade I diastolic dysfunction, very mild AS, PA systolic pressure 38 mmHg.  6. HTN - BP is fairly well controlled today.   Further plans and management per nephrology  7. DM 8. Hyperlipidemia 9. H/o upper GI bleed 10. Gout 11. Carotid dopplers (2/12) with no significant disease. 12. Lexiscan myoview (3/12) with EF 66%, no ischemia or infarction.  13. H/o lower GI bleed (6/12): ? Diverticular bleeding 14. PFTs (5/12): Mild restrictive defect.   Current medicines are reviewed at length with the patient today.  The patient does not have concerns regarding medicines.  The following changes have been made:  no change   Disposition:   FU with me as needed.     Signed, Nahser, Wonda Cheng, MD  02/14/2015 8:52 AM    [facility] [address], [place], [place]  [address] Phone: [phone]; Fax: [phone]

## 2015-02-14 NOTE — Patient Instructions (Signed)
Your physician recommends that you continue on your current medications as directed. Please refer to the Current Medication list given to you today.  Your physician recommends that you schedule a follow-up appointment in: as needed with Dr. Nahser  

## 2015-02-25 DIAGNOSIS — Z992 Dependence on renal dialysis: Secondary | ICD-10-CM | POA: Diagnosis not present

## 2015-02-25 DIAGNOSIS — N186 End stage renal disease: Secondary | ICD-10-CM | POA: Diagnosis not present

## 2015-02-27 DIAGNOSIS — N186 End stage renal disease: Secondary | ICD-10-CM | POA: Diagnosis not present

## 2015-02-27 DIAGNOSIS — E119 Type 2 diabetes mellitus without complications: Secondary | ICD-10-CM | POA: Diagnosis not present

## 2015-02-27 DIAGNOSIS — D631 Anemia in chronic kidney disease: Secondary | ICD-10-CM | POA: Diagnosis not present

## 2015-02-27 DIAGNOSIS — N2581 Secondary hyperparathyroidism of renal origin: Secondary | ICD-10-CM | POA: Diagnosis not present

## 2015-02-27 DIAGNOSIS — Z992 Dependence on renal dialysis: Secondary | ICD-10-CM | POA: Diagnosis not present

## 2015-03-19 ENCOUNTER — Telehealth: Payer: Self-pay | Admitting: *Deleted

## 2015-03-19 NOTE — Telephone Encounter (Signed)
Patient declines flu shot

## 2015-03-28 DIAGNOSIS — N186 End stage renal disease: Secondary | ICD-10-CM | POA: Diagnosis not present

## 2015-03-28 DIAGNOSIS — Z992 Dependence on renal dialysis: Secondary | ICD-10-CM | POA: Diagnosis not present

## 2015-03-28 DIAGNOSIS — E1129 Type 2 diabetes mellitus with other diabetic kidney complication: Secondary | ICD-10-CM | POA: Diagnosis not present

## 2015-03-29 DIAGNOSIS — E119 Type 2 diabetes mellitus without complications: Secondary | ICD-10-CM | POA: Diagnosis not present

## 2015-03-29 DIAGNOSIS — N186 End stage renal disease: Secondary | ICD-10-CM | POA: Diagnosis not present

## 2015-03-29 DIAGNOSIS — Z992 Dependence on renal dialysis: Secondary | ICD-10-CM | POA: Diagnosis not present

## 2015-03-29 DIAGNOSIS — D631 Anemia in chronic kidney disease: Secondary | ICD-10-CM | POA: Diagnosis not present

## 2015-03-29 DIAGNOSIS — N2581 Secondary hyperparathyroidism of renal origin: Secondary | ICD-10-CM | POA: Diagnosis not present

## 2015-04-18 ENCOUNTER — Encounter (INDEPENDENT_AMBULATORY_CARE_PROVIDER_SITE_OTHER): Payer: Medicare Other | Admitting: Ophthalmology

## 2015-04-18 DIAGNOSIS — H35033 Hypertensive retinopathy, bilateral: Secondary | ICD-10-CM

## 2015-04-18 DIAGNOSIS — E11351 Type 2 diabetes mellitus with proliferative diabetic retinopathy with macular edema: Secondary | ICD-10-CM

## 2015-04-18 DIAGNOSIS — I10 Essential (primary) hypertension: Secondary | ICD-10-CM

## 2015-04-18 DIAGNOSIS — E11311 Type 2 diabetes mellitus with unspecified diabetic retinopathy with macular edema: Secondary | ICD-10-CM

## 2015-04-24 DIAGNOSIS — E119 Type 2 diabetes mellitus without complications: Secondary | ICD-10-CM | POA: Diagnosis not present

## 2015-04-27 DIAGNOSIS — N186 End stage renal disease: Secondary | ICD-10-CM | POA: Diagnosis not present

## 2015-04-27 DIAGNOSIS — E1129 Type 2 diabetes mellitus with other diabetic kidney complication: Secondary | ICD-10-CM | POA: Diagnosis not present

## 2015-04-27 DIAGNOSIS — Z992 Dependence on renal dialysis: Secondary | ICD-10-CM | POA: Diagnosis not present

## 2015-04-29 DIAGNOSIS — N2581 Secondary hyperparathyroidism of renal origin: Secondary | ICD-10-CM | POA: Diagnosis not present

## 2015-04-29 DIAGNOSIS — E119 Type 2 diabetes mellitus without complications: Secondary | ICD-10-CM | POA: Diagnosis not present

## 2015-04-29 DIAGNOSIS — D631 Anemia in chronic kidney disease: Secondary | ICD-10-CM | POA: Diagnosis not present

## 2015-04-29 DIAGNOSIS — N186 End stage renal disease: Secondary | ICD-10-CM | POA: Diagnosis not present

## 2015-05-28 DIAGNOSIS — Z992 Dependence on renal dialysis: Secondary | ICD-10-CM | POA: Diagnosis not present

## 2015-05-28 DIAGNOSIS — E1129 Type 2 diabetes mellitus with other diabetic kidney complication: Secondary | ICD-10-CM | POA: Diagnosis not present

## 2015-05-28 DIAGNOSIS — N186 End stage renal disease: Secondary | ICD-10-CM | POA: Diagnosis not present

## 2015-05-29 DIAGNOSIS — N186 End stage renal disease: Secondary | ICD-10-CM | POA: Diagnosis not present

## 2015-05-29 DIAGNOSIS — E119 Type 2 diabetes mellitus without complications: Secondary | ICD-10-CM | POA: Diagnosis not present

## 2015-05-29 DIAGNOSIS — N2581 Secondary hyperparathyroidism of renal origin: Secondary | ICD-10-CM | POA: Diagnosis not present

## 2015-05-29 DIAGNOSIS — D631 Anemia in chronic kidney disease: Secondary | ICD-10-CM | POA: Diagnosis not present

## 2015-06-13 DIAGNOSIS — H2511 Age-related nuclear cataract, right eye: Secondary | ICD-10-CM | POA: Diagnosis not present

## 2015-06-13 DIAGNOSIS — H25011 Cortical age-related cataract, right eye: Secondary | ICD-10-CM | POA: Diagnosis not present

## 2015-06-13 DIAGNOSIS — H25041 Posterior subcapsular polar age-related cataract, right eye: Secondary | ICD-10-CM | POA: Diagnosis not present

## 2015-06-13 DIAGNOSIS — E11359 Type 2 diabetes mellitus with proliferative diabetic retinopathy without macular edema: Secondary | ICD-10-CM | POA: Diagnosis not present

## 2015-06-13 DIAGNOSIS — E11311 Type 2 diabetes mellitus with unspecified diabetic retinopathy with macular edema: Secondary | ICD-10-CM | POA: Diagnosis not present

## 2015-06-25 DIAGNOSIS — H5703 Miosis: Secondary | ICD-10-CM | POA: Diagnosis not present

## 2015-06-25 DIAGNOSIS — H2511 Age-related nuclear cataract, right eye: Secondary | ICD-10-CM | POA: Diagnosis not present

## 2015-06-27 DIAGNOSIS — N186 End stage renal disease: Secondary | ICD-10-CM | POA: Diagnosis not present

## 2015-06-27 DIAGNOSIS — E1129 Type 2 diabetes mellitus with other diabetic kidney complication: Secondary | ICD-10-CM | POA: Diagnosis not present

## 2015-06-27 DIAGNOSIS — Z992 Dependence on renal dialysis: Secondary | ICD-10-CM | POA: Diagnosis not present

## 2015-06-28 DIAGNOSIS — E119 Type 2 diabetes mellitus without complications: Secondary | ICD-10-CM | POA: Diagnosis not present

## 2015-06-28 DIAGNOSIS — N2581 Secondary hyperparathyroidism of renal origin: Secondary | ICD-10-CM | POA: Diagnosis not present

## 2015-06-28 DIAGNOSIS — N186 End stage renal disease: Secondary | ICD-10-CM | POA: Diagnosis not present

## 2015-07-24 DIAGNOSIS — E119 Type 2 diabetes mellitus without complications: Secondary | ICD-10-CM | POA: Diagnosis not present

## 2015-07-28 DIAGNOSIS — N186 End stage renal disease: Secondary | ICD-10-CM | POA: Diagnosis not present

## 2015-07-28 DIAGNOSIS — Z992 Dependence on renal dialysis: Secondary | ICD-10-CM | POA: Diagnosis not present

## 2015-07-28 DIAGNOSIS — E1129 Type 2 diabetes mellitus with other diabetic kidney complication: Secondary | ICD-10-CM | POA: Diagnosis not present

## 2015-07-29 DIAGNOSIS — N2581 Secondary hyperparathyroidism of renal origin: Secondary | ICD-10-CM | POA: Diagnosis not present

## 2015-07-29 DIAGNOSIS — N186 End stage renal disease: Secondary | ICD-10-CM | POA: Diagnosis not present

## 2015-07-29 DIAGNOSIS — D631 Anemia in chronic kidney disease: Secondary | ICD-10-CM | POA: Diagnosis not present

## 2015-07-29 DIAGNOSIS — E119 Type 2 diabetes mellitus without complications: Secondary | ICD-10-CM | POA: Diagnosis not present

## 2015-07-30 DIAGNOSIS — M545 Low back pain: Secondary | ICD-10-CM | POA: Diagnosis not present

## 2015-08-06 DIAGNOSIS — M5416 Radiculopathy, lumbar region: Secondary | ICD-10-CM | POA: Diagnosis not present

## 2015-08-06 DIAGNOSIS — M545 Low back pain: Secondary | ICD-10-CM | POA: Diagnosis not present

## 2015-08-22 DIAGNOSIS — M545 Low back pain: Secondary | ICD-10-CM | POA: Diagnosis not present

## 2015-08-22 DIAGNOSIS — M5416 Radiculopathy, lumbar region: Secondary | ICD-10-CM | POA: Diagnosis not present

## 2015-08-28 DIAGNOSIS — Z992 Dependence on renal dialysis: Secondary | ICD-10-CM | POA: Diagnosis not present

## 2015-08-28 DIAGNOSIS — E1129 Type 2 diabetes mellitus with other diabetic kidney complication: Secondary | ICD-10-CM | POA: Diagnosis not present

## 2015-08-28 DIAGNOSIS — N186 End stage renal disease: Secondary | ICD-10-CM | POA: Diagnosis not present

## 2015-08-30 DIAGNOSIS — N2581 Secondary hyperparathyroidism of renal origin: Secondary | ICD-10-CM | POA: Diagnosis not present

## 2015-08-30 DIAGNOSIS — N186 End stage renal disease: Secondary | ICD-10-CM | POA: Diagnosis not present

## 2015-08-30 DIAGNOSIS — D631 Anemia in chronic kidney disease: Secondary | ICD-10-CM | POA: Diagnosis not present

## 2015-08-30 DIAGNOSIS — D509 Iron deficiency anemia, unspecified: Secondary | ICD-10-CM | POA: Diagnosis not present

## 2015-08-30 DIAGNOSIS — E119 Type 2 diabetes mellitus without complications: Secondary | ICD-10-CM | POA: Diagnosis not present

## 2015-09-27 DIAGNOSIS — E1129 Type 2 diabetes mellitus with other diabetic kidney complication: Secondary | ICD-10-CM | POA: Diagnosis not present

## 2015-09-27 DIAGNOSIS — N186 End stage renal disease: Secondary | ICD-10-CM | POA: Diagnosis not present

## 2015-09-27 DIAGNOSIS — Z992 Dependence on renal dialysis: Secondary | ICD-10-CM | POA: Diagnosis not present

## 2015-09-30 DIAGNOSIS — N186 End stage renal disease: Secondary | ICD-10-CM | POA: Diagnosis not present

## 2015-09-30 DIAGNOSIS — E119 Type 2 diabetes mellitus without complications: Secondary | ICD-10-CM | POA: Diagnosis not present

## 2015-09-30 DIAGNOSIS — N2581 Secondary hyperparathyroidism of renal origin: Secondary | ICD-10-CM | POA: Diagnosis not present

## 2015-09-30 DIAGNOSIS — D631 Anemia in chronic kidney disease: Secondary | ICD-10-CM | POA: Diagnosis not present

## 2015-09-30 DIAGNOSIS — D509 Iron deficiency anemia, unspecified: Secondary | ICD-10-CM | POA: Diagnosis not present

## 2015-10-23 DIAGNOSIS — E119 Type 2 diabetes mellitus without complications: Secondary | ICD-10-CM | POA: Diagnosis not present

## 2015-10-28 DIAGNOSIS — Z992 Dependence on renal dialysis: Secondary | ICD-10-CM | POA: Diagnosis not present

## 2015-10-28 DIAGNOSIS — N186 End stage renal disease: Secondary | ICD-10-CM | POA: Diagnosis not present

## 2015-10-28 DIAGNOSIS — E1129 Type 2 diabetes mellitus with other diabetic kidney complication: Secondary | ICD-10-CM | POA: Diagnosis not present

## 2015-10-30 DIAGNOSIS — N2581 Secondary hyperparathyroidism of renal origin: Secondary | ICD-10-CM | POA: Diagnosis not present

## 2015-10-30 DIAGNOSIS — D631 Anemia in chronic kidney disease: Secondary | ICD-10-CM | POA: Diagnosis not present

## 2015-10-30 DIAGNOSIS — N186 End stage renal disease: Secondary | ICD-10-CM | POA: Diagnosis not present

## 2015-10-30 DIAGNOSIS — D509 Iron deficiency anemia, unspecified: Secondary | ICD-10-CM | POA: Diagnosis not present

## 2015-10-30 DIAGNOSIS — E119 Type 2 diabetes mellitus without complications: Secondary | ICD-10-CM | POA: Diagnosis not present

## 2015-11-27 DIAGNOSIS — E1129 Type 2 diabetes mellitus with other diabetic kidney complication: Secondary | ICD-10-CM | POA: Diagnosis not present

## 2015-11-27 DIAGNOSIS — Z992 Dependence on renal dialysis: Secondary | ICD-10-CM | POA: Diagnosis not present

## 2015-11-27 DIAGNOSIS — N186 End stage renal disease: Secondary | ICD-10-CM | POA: Diagnosis not present

## 2015-11-29 DIAGNOSIS — N186 End stage renal disease: Secondary | ICD-10-CM | POA: Diagnosis not present

## 2015-11-29 DIAGNOSIS — E119 Type 2 diabetes mellitus without complications: Secondary | ICD-10-CM | POA: Diagnosis not present

## 2015-11-29 DIAGNOSIS — N2581 Secondary hyperparathyroidism of renal origin: Secondary | ICD-10-CM | POA: Diagnosis not present

## 2015-11-29 DIAGNOSIS — D509 Iron deficiency anemia, unspecified: Secondary | ICD-10-CM | POA: Diagnosis not present

## 2015-12-02 DIAGNOSIS — D509 Iron deficiency anemia, unspecified: Secondary | ICD-10-CM | POA: Diagnosis not present

## 2015-12-02 DIAGNOSIS — N186 End stage renal disease: Secondary | ICD-10-CM | POA: Diagnosis not present

## 2015-12-02 DIAGNOSIS — N2581 Secondary hyperparathyroidism of renal origin: Secondary | ICD-10-CM | POA: Diagnosis not present

## 2015-12-02 DIAGNOSIS — E119 Type 2 diabetes mellitus without complications: Secondary | ICD-10-CM | POA: Diagnosis not present

## 2015-12-04 DIAGNOSIS — E119 Type 2 diabetes mellitus without complications: Secondary | ICD-10-CM | POA: Diagnosis not present

## 2015-12-04 DIAGNOSIS — N186 End stage renal disease: Secondary | ICD-10-CM | POA: Diagnosis not present

## 2015-12-04 DIAGNOSIS — D509 Iron deficiency anemia, unspecified: Secondary | ICD-10-CM | POA: Diagnosis not present

## 2015-12-04 DIAGNOSIS — N2581 Secondary hyperparathyroidism of renal origin: Secondary | ICD-10-CM | POA: Diagnosis not present

## 2015-12-06 DIAGNOSIS — N2581 Secondary hyperparathyroidism of renal origin: Secondary | ICD-10-CM | POA: Diagnosis not present

## 2015-12-06 DIAGNOSIS — E119 Type 2 diabetes mellitus without complications: Secondary | ICD-10-CM | POA: Diagnosis not present

## 2015-12-06 DIAGNOSIS — D509 Iron deficiency anemia, unspecified: Secondary | ICD-10-CM | POA: Diagnosis not present

## 2015-12-06 DIAGNOSIS — N186 End stage renal disease: Secondary | ICD-10-CM | POA: Diagnosis not present

## 2015-12-09 DIAGNOSIS — E119 Type 2 diabetes mellitus without complications: Secondary | ICD-10-CM | POA: Diagnosis not present

## 2015-12-09 DIAGNOSIS — N186 End stage renal disease: Secondary | ICD-10-CM | POA: Diagnosis not present

## 2015-12-09 DIAGNOSIS — N2581 Secondary hyperparathyroidism of renal origin: Secondary | ICD-10-CM | POA: Diagnosis not present

## 2015-12-09 DIAGNOSIS — D509 Iron deficiency anemia, unspecified: Secondary | ICD-10-CM | POA: Diagnosis not present

## 2015-12-11 DIAGNOSIS — N186 End stage renal disease: Secondary | ICD-10-CM | POA: Diagnosis not present

## 2015-12-11 DIAGNOSIS — E119 Type 2 diabetes mellitus without complications: Secondary | ICD-10-CM | POA: Diagnosis not present

## 2015-12-11 DIAGNOSIS — D509 Iron deficiency anemia, unspecified: Secondary | ICD-10-CM | POA: Diagnosis not present

## 2015-12-11 DIAGNOSIS — N2581 Secondary hyperparathyroidism of renal origin: Secondary | ICD-10-CM | POA: Diagnosis not present

## 2015-12-13 DIAGNOSIS — N186 End stage renal disease: Secondary | ICD-10-CM | POA: Diagnosis not present

## 2015-12-13 DIAGNOSIS — D509 Iron deficiency anemia, unspecified: Secondary | ICD-10-CM | POA: Diagnosis not present

## 2015-12-13 DIAGNOSIS — N2581 Secondary hyperparathyroidism of renal origin: Secondary | ICD-10-CM | POA: Diagnosis not present

## 2015-12-13 DIAGNOSIS — E119 Type 2 diabetes mellitus without complications: Secondary | ICD-10-CM | POA: Diagnosis not present

## 2015-12-16 DIAGNOSIS — N186 End stage renal disease: Secondary | ICD-10-CM | POA: Diagnosis not present

## 2015-12-16 DIAGNOSIS — N2581 Secondary hyperparathyroidism of renal origin: Secondary | ICD-10-CM | POA: Diagnosis not present

## 2015-12-16 DIAGNOSIS — E119 Type 2 diabetes mellitus without complications: Secondary | ICD-10-CM | POA: Diagnosis not present

## 2015-12-16 DIAGNOSIS — D509 Iron deficiency anemia, unspecified: Secondary | ICD-10-CM | POA: Diagnosis not present

## 2015-12-18 DIAGNOSIS — E119 Type 2 diabetes mellitus without complications: Secondary | ICD-10-CM | POA: Diagnosis not present

## 2015-12-18 DIAGNOSIS — N2581 Secondary hyperparathyroidism of renal origin: Secondary | ICD-10-CM | POA: Diagnosis not present

## 2015-12-18 DIAGNOSIS — N186 End stage renal disease: Secondary | ICD-10-CM | POA: Diagnosis not present

## 2015-12-18 DIAGNOSIS — D509 Iron deficiency anemia, unspecified: Secondary | ICD-10-CM | POA: Diagnosis not present

## 2015-12-20 DIAGNOSIS — E119 Type 2 diabetes mellitus without complications: Secondary | ICD-10-CM | POA: Diagnosis not present

## 2015-12-20 DIAGNOSIS — N186 End stage renal disease: Secondary | ICD-10-CM | POA: Diagnosis not present

## 2015-12-20 DIAGNOSIS — N2581 Secondary hyperparathyroidism of renal origin: Secondary | ICD-10-CM | POA: Diagnosis not present

## 2015-12-20 DIAGNOSIS — D509 Iron deficiency anemia, unspecified: Secondary | ICD-10-CM | POA: Diagnosis not present

## 2015-12-23 DIAGNOSIS — N186 End stage renal disease: Secondary | ICD-10-CM | POA: Diagnosis not present

## 2015-12-23 DIAGNOSIS — E119 Type 2 diabetes mellitus without complications: Secondary | ICD-10-CM | POA: Diagnosis not present

## 2015-12-23 DIAGNOSIS — D509 Iron deficiency anemia, unspecified: Secondary | ICD-10-CM | POA: Diagnosis not present

## 2015-12-23 DIAGNOSIS — N2581 Secondary hyperparathyroidism of renal origin: Secondary | ICD-10-CM | POA: Diagnosis not present

## 2015-12-25 DIAGNOSIS — D509 Iron deficiency anemia, unspecified: Secondary | ICD-10-CM | POA: Diagnosis not present

## 2015-12-25 DIAGNOSIS — N186 End stage renal disease: Secondary | ICD-10-CM | POA: Diagnosis not present

## 2015-12-25 DIAGNOSIS — N2581 Secondary hyperparathyroidism of renal origin: Secondary | ICD-10-CM | POA: Diagnosis not present

## 2015-12-25 DIAGNOSIS — E119 Type 2 diabetes mellitus without complications: Secondary | ICD-10-CM | POA: Diagnosis not present

## 2015-12-27 DIAGNOSIS — D509 Iron deficiency anemia, unspecified: Secondary | ICD-10-CM | POA: Diagnosis not present

## 2015-12-27 DIAGNOSIS — E119 Type 2 diabetes mellitus without complications: Secondary | ICD-10-CM | POA: Diagnosis not present

## 2015-12-27 DIAGNOSIS — N2581 Secondary hyperparathyroidism of renal origin: Secondary | ICD-10-CM | POA: Diagnosis not present

## 2015-12-27 DIAGNOSIS — N186 End stage renal disease: Secondary | ICD-10-CM | POA: Diagnosis not present

## 2015-12-28 DIAGNOSIS — Z992 Dependence on renal dialysis: Secondary | ICD-10-CM | POA: Diagnosis not present

## 2015-12-28 DIAGNOSIS — E1129 Type 2 diabetes mellitus with other diabetic kidney complication: Secondary | ICD-10-CM | POA: Diagnosis not present

## 2015-12-28 DIAGNOSIS — N186 End stage renal disease: Secondary | ICD-10-CM | POA: Diagnosis not present

## 2015-12-30 DIAGNOSIS — D631 Anemia in chronic kidney disease: Secondary | ICD-10-CM | POA: Diagnosis not present

## 2015-12-30 DIAGNOSIS — N2581 Secondary hyperparathyroidism of renal origin: Secondary | ICD-10-CM | POA: Diagnosis not present

## 2015-12-30 DIAGNOSIS — E119 Type 2 diabetes mellitus without complications: Secondary | ICD-10-CM | POA: Diagnosis not present

## 2015-12-30 DIAGNOSIS — N186 End stage renal disease: Secondary | ICD-10-CM | POA: Diagnosis not present

## 2016-01-01 DIAGNOSIS — D631 Anemia in chronic kidney disease: Secondary | ICD-10-CM | POA: Diagnosis not present

## 2016-01-01 DIAGNOSIS — N186 End stage renal disease: Secondary | ICD-10-CM | POA: Diagnosis not present

## 2016-01-01 DIAGNOSIS — N2581 Secondary hyperparathyroidism of renal origin: Secondary | ICD-10-CM | POA: Diagnosis not present

## 2016-01-01 DIAGNOSIS — E119 Type 2 diabetes mellitus without complications: Secondary | ICD-10-CM | POA: Diagnosis not present

## 2016-01-03 DIAGNOSIS — D631 Anemia in chronic kidney disease: Secondary | ICD-10-CM | POA: Diagnosis not present

## 2016-01-03 DIAGNOSIS — E119 Type 2 diabetes mellitus without complications: Secondary | ICD-10-CM | POA: Diagnosis not present

## 2016-01-03 DIAGNOSIS — N2581 Secondary hyperparathyroidism of renal origin: Secondary | ICD-10-CM | POA: Diagnosis not present

## 2016-01-03 DIAGNOSIS — N186 End stage renal disease: Secondary | ICD-10-CM | POA: Diagnosis not present

## 2016-01-06 DIAGNOSIS — N2581 Secondary hyperparathyroidism of renal origin: Secondary | ICD-10-CM | POA: Diagnosis not present

## 2016-01-06 DIAGNOSIS — N186 End stage renal disease: Secondary | ICD-10-CM | POA: Diagnosis not present

## 2016-01-06 DIAGNOSIS — D631 Anemia in chronic kidney disease: Secondary | ICD-10-CM | POA: Diagnosis not present

## 2016-01-06 DIAGNOSIS — E119 Type 2 diabetes mellitus without complications: Secondary | ICD-10-CM | POA: Diagnosis not present

## 2016-01-08 DIAGNOSIS — N186 End stage renal disease: Secondary | ICD-10-CM | POA: Diagnosis not present

## 2016-01-08 DIAGNOSIS — E119 Type 2 diabetes mellitus without complications: Secondary | ICD-10-CM | POA: Diagnosis not present

## 2016-01-08 DIAGNOSIS — N2581 Secondary hyperparathyroidism of renal origin: Secondary | ICD-10-CM | POA: Diagnosis not present

## 2016-01-08 DIAGNOSIS — D631 Anemia in chronic kidney disease: Secondary | ICD-10-CM | POA: Diagnosis not present

## 2016-01-10 DIAGNOSIS — N186 End stage renal disease: Secondary | ICD-10-CM | POA: Diagnosis not present

## 2016-01-10 DIAGNOSIS — N2581 Secondary hyperparathyroidism of renal origin: Secondary | ICD-10-CM | POA: Diagnosis not present

## 2016-01-10 DIAGNOSIS — E119 Type 2 diabetes mellitus without complications: Secondary | ICD-10-CM | POA: Diagnosis not present

## 2016-01-10 DIAGNOSIS — D631 Anemia in chronic kidney disease: Secondary | ICD-10-CM | POA: Diagnosis not present

## 2016-01-13 DIAGNOSIS — D631 Anemia in chronic kidney disease: Secondary | ICD-10-CM | POA: Diagnosis not present

## 2016-01-13 DIAGNOSIS — E119 Type 2 diabetes mellitus without complications: Secondary | ICD-10-CM | POA: Diagnosis not present

## 2016-01-13 DIAGNOSIS — N186 End stage renal disease: Secondary | ICD-10-CM | POA: Diagnosis not present

## 2016-01-13 DIAGNOSIS — N2581 Secondary hyperparathyroidism of renal origin: Secondary | ICD-10-CM | POA: Diagnosis not present

## 2016-01-15 DIAGNOSIS — D631 Anemia in chronic kidney disease: Secondary | ICD-10-CM | POA: Diagnosis not present

## 2016-01-15 DIAGNOSIS — E119 Type 2 diabetes mellitus without complications: Secondary | ICD-10-CM | POA: Diagnosis not present

## 2016-01-15 DIAGNOSIS — N186 End stage renal disease: Secondary | ICD-10-CM | POA: Diagnosis not present

## 2016-01-15 DIAGNOSIS — N2581 Secondary hyperparathyroidism of renal origin: Secondary | ICD-10-CM | POA: Diagnosis not present

## 2016-01-17 DIAGNOSIS — N2581 Secondary hyperparathyroidism of renal origin: Secondary | ICD-10-CM | POA: Diagnosis not present

## 2016-01-17 DIAGNOSIS — E119 Type 2 diabetes mellitus without complications: Secondary | ICD-10-CM | POA: Diagnosis not present

## 2016-01-17 DIAGNOSIS — N186 End stage renal disease: Secondary | ICD-10-CM | POA: Diagnosis not present

## 2016-01-17 DIAGNOSIS — D631 Anemia in chronic kidney disease: Secondary | ICD-10-CM | POA: Diagnosis not present

## 2016-01-20 DIAGNOSIS — E119 Type 2 diabetes mellitus without complications: Secondary | ICD-10-CM | POA: Diagnosis not present

## 2016-01-20 DIAGNOSIS — N186 End stage renal disease: Secondary | ICD-10-CM | POA: Diagnosis not present

## 2016-01-20 DIAGNOSIS — D631 Anemia in chronic kidney disease: Secondary | ICD-10-CM | POA: Diagnosis not present

## 2016-01-20 DIAGNOSIS — N2581 Secondary hyperparathyroidism of renal origin: Secondary | ICD-10-CM | POA: Diagnosis not present

## 2016-01-22 DIAGNOSIS — N2581 Secondary hyperparathyroidism of renal origin: Secondary | ICD-10-CM | POA: Diagnosis not present

## 2016-01-22 DIAGNOSIS — E119 Type 2 diabetes mellitus without complications: Secondary | ICD-10-CM | POA: Diagnosis not present

## 2016-01-22 DIAGNOSIS — D631 Anemia in chronic kidney disease: Secondary | ICD-10-CM | POA: Diagnosis not present

## 2016-01-22 DIAGNOSIS — N186 End stage renal disease: Secondary | ICD-10-CM | POA: Diagnosis not present

## 2016-01-24 DIAGNOSIS — E119 Type 2 diabetes mellitus without complications: Secondary | ICD-10-CM | POA: Diagnosis not present

## 2016-01-24 DIAGNOSIS — D631 Anemia in chronic kidney disease: Secondary | ICD-10-CM | POA: Diagnosis not present

## 2016-01-24 DIAGNOSIS — N186 End stage renal disease: Secondary | ICD-10-CM | POA: Diagnosis not present

## 2016-01-24 DIAGNOSIS — N2581 Secondary hyperparathyroidism of renal origin: Secondary | ICD-10-CM | POA: Diagnosis not present

## 2016-01-27 DIAGNOSIS — E119 Type 2 diabetes mellitus without complications: Secondary | ICD-10-CM | POA: Diagnosis not present

## 2016-01-27 DIAGNOSIS — N186 End stage renal disease: Secondary | ICD-10-CM | POA: Diagnosis not present

## 2016-01-27 DIAGNOSIS — D631 Anemia in chronic kidney disease: Secondary | ICD-10-CM | POA: Diagnosis not present

## 2016-01-27 DIAGNOSIS — N2581 Secondary hyperparathyroidism of renal origin: Secondary | ICD-10-CM | POA: Diagnosis not present

## 2016-01-28 DIAGNOSIS — E1129 Type 2 diabetes mellitus with other diabetic kidney complication: Secondary | ICD-10-CM | POA: Diagnosis not present

## 2016-01-28 DIAGNOSIS — Z992 Dependence on renal dialysis: Secondary | ICD-10-CM | POA: Diagnosis not present

## 2016-01-28 DIAGNOSIS — N186 End stage renal disease: Secondary | ICD-10-CM | POA: Diagnosis not present

## 2016-01-29 DIAGNOSIS — E119 Type 2 diabetes mellitus without complications: Secondary | ICD-10-CM | POA: Diagnosis not present

## 2016-01-29 DIAGNOSIS — N186 End stage renal disease: Secondary | ICD-10-CM | POA: Diagnosis not present

## 2016-01-31 DIAGNOSIS — N186 End stage renal disease: Secondary | ICD-10-CM | POA: Diagnosis not present

## 2016-01-31 DIAGNOSIS — E119 Type 2 diabetes mellitus without complications: Secondary | ICD-10-CM | POA: Diagnosis not present

## 2016-02-03 DIAGNOSIS — N186 End stage renal disease: Secondary | ICD-10-CM | POA: Diagnosis not present

## 2016-02-03 DIAGNOSIS — E119 Type 2 diabetes mellitus without complications: Secondary | ICD-10-CM | POA: Diagnosis not present

## 2016-02-05 DIAGNOSIS — E119 Type 2 diabetes mellitus without complications: Secondary | ICD-10-CM | POA: Diagnosis not present

## 2016-02-05 DIAGNOSIS — N186 End stage renal disease: Secondary | ICD-10-CM | POA: Diagnosis not present

## 2016-02-07 DIAGNOSIS — E119 Type 2 diabetes mellitus without complications: Secondary | ICD-10-CM | POA: Diagnosis not present

## 2016-02-07 DIAGNOSIS — N186 End stage renal disease: Secondary | ICD-10-CM | POA: Diagnosis not present

## 2016-02-10 DIAGNOSIS — E119 Type 2 diabetes mellitus without complications: Secondary | ICD-10-CM | POA: Diagnosis not present

## 2016-02-10 DIAGNOSIS — N186 End stage renal disease: Secondary | ICD-10-CM | POA: Diagnosis not present

## 2016-02-12 DIAGNOSIS — E119 Type 2 diabetes mellitus without complications: Secondary | ICD-10-CM | POA: Diagnosis not present

## 2016-02-12 DIAGNOSIS — N186 End stage renal disease: Secondary | ICD-10-CM | POA: Diagnosis not present

## 2016-02-14 DIAGNOSIS — E119 Type 2 diabetes mellitus without complications: Secondary | ICD-10-CM | POA: Diagnosis not present

## 2016-02-14 DIAGNOSIS — N186 End stage renal disease: Secondary | ICD-10-CM | POA: Diagnosis not present

## 2016-02-17 DIAGNOSIS — E119 Type 2 diabetes mellitus without complications: Secondary | ICD-10-CM | POA: Diagnosis not present

## 2016-02-17 DIAGNOSIS — N186 End stage renal disease: Secondary | ICD-10-CM | POA: Diagnosis not present

## 2016-02-19 DIAGNOSIS — N186 End stage renal disease: Secondary | ICD-10-CM | POA: Diagnosis not present

## 2016-02-19 DIAGNOSIS — E119 Type 2 diabetes mellitus without complications: Secondary | ICD-10-CM | POA: Diagnosis not present

## 2016-02-21 DIAGNOSIS — N186 End stage renal disease: Secondary | ICD-10-CM | POA: Diagnosis not present

## 2016-02-21 DIAGNOSIS — E119 Type 2 diabetes mellitus without complications: Secondary | ICD-10-CM | POA: Diagnosis not present

## 2016-02-24 DIAGNOSIS — E119 Type 2 diabetes mellitus without complications: Secondary | ICD-10-CM | POA: Diagnosis not present

## 2016-02-24 DIAGNOSIS — N186 End stage renal disease: Secondary | ICD-10-CM | POA: Diagnosis not present

## 2016-02-25 DIAGNOSIS — Z992 Dependence on renal dialysis: Secondary | ICD-10-CM | POA: Diagnosis not present

## 2016-02-25 DIAGNOSIS — E1129 Type 2 diabetes mellitus with other diabetic kidney complication: Secondary | ICD-10-CM | POA: Diagnosis not present

## 2016-02-25 DIAGNOSIS — N186 End stage renal disease: Secondary | ICD-10-CM | POA: Diagnosis not present

## 2016-02-26 DIAGNOSIS — N2581 Secondary hyperparathyroidism of renal origin: Secondary | ICD-10-CM | POA: Diagnosis not present

## 2016-02-26 DIAGNOSIS — N186 End stage renal disease: Secondary | ICD-10-CM | POA: Diagnosis not present

## 2016-02-26 DIAGNOSIS — E119 Type 2 diabetes mellitus without complications: Secondary | ICD-10-CM | POA: Diagnosis not present

## 2016-02-28 DIAGNOSIS — E119 Type 2 diabetes mellitus without complications: Secondary | ICD-10-CM | POA: Diagnosis not present

## 2016-02-28 DIAGNOSIS — N2581 Secondary hyperparathyroidism of renal origin: Secondary | ICD-10-CM | POA: Diagnosis not present

## 2016-02-28 DIAGNOSIS — N186 End stage renal disease: Secondary | ICD-10-CM | POA: Diagnosis not present

## 2016-03-02 DIAGNOSIS — N2581 Secondary hyperparathyroidism of renal origin: Secondary | ICD-10-CM | POA: Diagnosis not present

## 2016-03-02 DIAGNOSIS — N186 End stage renal disease: Secondary | ICD-10-CM | POA: Diagnosis not present

## 2016-03-02 DIAGNOSIS — E119 Type 2 diabetes mellitus without complications: Secondary | ICD-10-CM | POA: Diagnosis not present

## 2016-03-04 DIAGNOSIS — N186 End stage renal disease: Secondary | ICD-10-CM | POA: Diagnosis not present

## 2016-03-04 DIAGNOSIS — N2581 Secondary hyperparathyroidism of renal origin: Secondary | ICD-10-CM | POA: Diagnosis not present

## 2016-03-04 DIAGNOSIS — E119 Type 2 diabetes mellitus without complications: Secondary | ICD-10-CM | POA: Diagnosis not present

## 2016-03-06 DIAGNOSIS — N186 End stage renal disease: Secondary | ICD-10-CM | POA: Diagnosis not present

## 2016-03-06 DIAGNOSIS — N2581 Secondary hyperparathyroidism of renal origin: Secondary | ICD-10-CM | POA: Diagnosis not present

## 2016-03-06 DIAGNOSIS — E119 Type 2 diabetes mellitus without complications: Secondary | ICD-10-CM | POA: Diagnosis not present

## 2016-03-09 ENCOUNTER — Other Ambulatory Visit: Payer: Self-pay | Admitting: Endocrinology

## 2016-03-09 DIAGNOSIS — N186 End stage renal disease: Secondary | ICD-10-CM | POA: Diagnosis not present

## 2016-03-09 DIAGNOSIS — N2581 Secondary hyperparathyroidism of renal origin: Secondary | ICD-10-CM | POA: Diagnosis not present

## 2016-03-09 DIAGNOSIS — E119 Type 2 diabetes mellitus without complications: Secondary | ICD-10-CM | POA: Diagnosis not present

## 2016-03-11 DIAGNOSIS — E119 Type 2 diabetes mellitus without complications: Secondary | ICD-10-CM | POA: Diagnosis not present

## 2016-03-11 DIAGNOSIS — N186 End stage renal disease: Secondary | ICD-10-CM | POA: Diagnosis not present

## 2016-03-11 DIAGNOSIS — N2581 Secondary hyperparathyroidism of renal origin: Secondary | ICD-10-CM | POA: Diagnosis not present

## 2016-03-13 DIAGNOSIS — N186 End stage renal disease: Secondary | ICD-10-CM | POA: Diagnosis not present

## 2016-03-13 DIAGNOSIS — E119 Type 2 diabetes mellitus without complications: Secondary | ICD-10-CM | POA: Diagnosis not present

## 2016-03-13 DIAGNOSIS — N2581 Secondary hyperparathyroidism of renal origin: Secondary | ICD-10-CM | POA: Diagnosis not present

## 2016-03-16 DIAGNOSIS — E119 Type 2 diabetes mellitus without complications: Secondary | ICD-10-CM | POA: Diagnosis not present

## 2016-03-16 DIAGNOSIS — N186 End stage renal disease: Secondary | ICD-10-CM | POA: Diagnosis not present

## 2016-03-16 DIAGNOSIS — N2581 Secondary hyperparathyroidism of renal origin: Secondary | ICD-10-CM | POA: Diagnosis not present

## 2016-03-18 DIAGNOSIS — N2581 Secondary hyperparathyroidism of renal origin: Secondary | ICD-10-CM | POA: Diagnosis not present

## 2016-03-18 DIAGNOSIS — E119 Type 2 diabetes mellitus without complications: Secondary | ICD-10-CM | POA: Diagnosis not present

## 2016-03-18 DIAGNOSIS — N186 End stage renal disease: Secondary | ICD-10-CM | POA: Diagnosis not present

## 2016-03-20 DIAGNOSIS — N186 End stage renal disease: Secondary | ICD-10-CM | POA: Diagnosis not present

## 2016-03-20 DIAGNOSIS — E119 Type 2 diabetes mellitus without complications: Secondary | ICD-10-CM | POA: Diagnosis not present

## 2016-03-20 DIAGNOSIS — N2581 Secondary hyperparathyroidism of renal origin: Secondary | ICD-10-CM | POA: Diagnosis not present

## 2016-03-23 DIAGNOSIS — N186 End stage renal disease: Secondary | ICD-10-CM | POA: Diagnosis not present

## 2016-03-23 DIAGNOSIS — E119 Type 2 diabetes mellitus without complications: Secondary | ICD-10-CM | POA: Diagnosis not present

## 2016-03-23 DIAGNOSIS — N2581 Secondary hyperparathyroidism of renal origin: Secondary | ICD-10-CM | POA: Diagnosis not present

## 2016-03-25 DIAGNOSIS — E119 Type 2 diabetes mellitus without complications: Secondary | ICD-10-CM | POA: Diagnosis not present

## 2016-03-25 DIAGNOSIS — N2581 Secondary hyperparathyroidism of renal origin: Secondary | ICD-10-CM | POA: Diagnosis not present

## 2016-03-25 DIAGNOSIS — N186 End stage renal disease: Secondary | ICD-10-CM | POA: Diagnosis not present

## 2016-03-27 DIAGNOSIS — Z992 Dependence on renal dialysis: Secondary | ICD-10-CM | POA: Diagnosis not present

## 2016-03-27 DIAGNOSIS — E1129 Type 2 diabetes mellitus with other diabetic kidney complication: Secondary | ICD-10-CM | POA: Diagnosis not present

## 2016-03-27 DIAGNOSIS — E119 Type 2 diabetes mellitus without complications: Secondary | ICD-10-CM | POA: Diagnosis not present

## 2016-03-27 DIAGNOSIS — N2581 Secondary hyperparathyroidism of renal origin: Secondary | ICD-10-CM | POA: Diagnosis not present

## 2016-03-27 DIAGNOSIS — N186 End stage renal disease: Secondary | ICD-10-CM | POA: Diagnosis not present

## 2016-03-30 DIAGNOSIS — D631 Anemia in chronic kidney disease: Secondary | ICD-10-CM | POA: Diagnosis not present

## 2016-03-30 DIAGNOSIS — N186 End stage renal disease: Secondary | ICD-10-CM | POA: Diagnosis not present

## 2016-03-30 DIAGNOSIS — E119 Type 2 diabetes mellitus without complications: Secondary | ICD-10-CM | POA: Diagnosis not present

## 2016-04-01 DIAGNOSIS — D631 Anemia in chronic kidney disease: Secondary | ICD-10-CM | POA: Diagnosis not present

## 2016-04-01 DIAGNOSIS — N186 End stage renal disease: Secondary | ICD-10-CM | POA: Diagnosis not present

## 2016-04-01 DIAGNOSIS — E119 Type 2 diabetes mellitus without complications: Secondary | ICD-10-CM | POA: Diagnosis not present

## 2016-04-03 DIAGNOSIS — N186 End stage renal disease: Secondary | ICD-10-CM | POA: Diagnosis not present

## 2016-04-03 DIAGNOSIS — D631 Anemia in chronic kidney disease: Secondary | ICD-10-CM | POA: Diagnosis not present

## 2016-04-03 DIAGNOSIS — E119 Type 2 diabetes mellitus without complications: Secondary | ICD-10-CM | POA: Diagnosis not present

## 2016-04-06 DIAGNOSIS — N186 End stage renal disease: Secondary | ICD-10-CM | POA: Diagnosis not present

## 2016-04-06 DIAGNOSIS — E119 Type 2 diabetes mellitus without complications: Secondary | ICD-10-CM | POA: Diagnosis not present

## 2016-04-06 DIAGNOSIS — D631 Anemia in chronic kidney disease: Secondary | ICD-10-CM | POA: Diagnosis not present

## 2016-04-08 DIAGNOSIS — D631 Anemia in chronic kidney disease: Secondary | ICD-10-CM | POA: Diagnosis not present

## 2016-04-08 DIAGNOSIS — E119 Type 2 diabetes mellitus without complications: Secondary | ICD-10-CM | POA: Diagnosis not present

## 2016-04-08 DIAGNOSIS — N186 End stage renal disease: Secondary | ICD-10-CM | POA: Diagnosis not present

## 2016-04-10 DIAGNOSIS — N186 End stage renal disease: Secondary | ICD-10-CM | POA: Diagnosis not present

## 2016-04-10 DIAGNOSIS — E119 Type 2 diabetes mellitus without complications: Secondary | ICD-10-CM | POA: Diagnosis not present

## 2016-04-10 DIAGNOSIS — D631 Anemia in chronic kidney disease: Secondary | ICD-10-CM | POA: Diagnosis not present

## 2016-04-13 DIAGNOSIS — E119 Type 2 diabetes mellitus without complications: Secondary | ICD-10-CM | POA: Diagnosis not present

## 2016-04-13 DIAGNOSIS — D631 Anemia in chronic kidney disease: Secondary | ICD-10-CM | POA: Diagnosis not present

## 2016-04-13 DIAGNOSIS — N186 End stage renal disease: Secondary | ICD-10-CM | POA: Diagnosis not present

## 2016-04-15 DIAGNOSIS — D631 Anemia in chronic kidney disease: Secondary | ICD-10-CM | POA: Diagnosis not present

## 2016-04-15 DIAGNOSIS — N186 End stage renal disease: Secondary | ICD-10-CM | POA: Diagnosis not present

## 2016-04-15 DIAGNOSIS — E119 Type 2 diabetes mellitus without complications: Secondary | ICD-10-CM | POA: Diagnosis not present

## 2016-04-17 DIAGNOSIS — E119 Type 2 diabetes mellitus without complications: Secondary | ICD-10-CM | POA: Diagnosis not present

## 2016-04-17 DIAGNOSIS — D631 Anemia in chronic kidney disease: Secondary | ICD-10-CM | POA: Diagnosis not present

## 2016-04-17 DIAGNOSIS — N186 End stage renal disease: Secondary | ICD-10-CM | POA: Diagnosis not present

## 2016-04-20 DIAGNOSIS — D631 Anemia in chronic kidney disease: Secondary | ICD-10-CM | POA: Diagnosis not present

## 2016-04-20 DIAGNOSIS — N186 End stage renal disease: Secondary | ICD-10-CM | POA: Diagnosis not present

## 2016-04-20 DIAGNOSIS — E119 Type 2 diabetes mellitus without complications: Secondary | ICD-10-CM | POA: Diagnosis not present

## 2016-04-21 ENCOUNTER — Ambulatory Visit (INDEPENDENT_AMBULATORY_CARE_PROVIDER_SITE_OTHER): Payer: Medicare Other | Admitting: Ophthalmology

## 2016-04-22 DIAGNOSIS — E119 Type 2 diabetes mellitus without complications: Secondary | ICD-10-CM | POA: Diagnosis not present

## 2016-04-22 DIAGNOSIS — N186 End stage renal disease: Secondary | ICD-10-CM | POA: Diagnosis not present

## 2016-04-22 DIAGNOSIS — D631 Anemia in chronic kidney disease: Secondary | ICD-10-CM | POA: Diagnosis not present

## 2016-04-24 DIAGNOSIS — N186 End stage renal disease: Secondary | ICD-10-CM | POA: Diagnosis not present

## 2016-04-24 DIAGNOSIS — E119 Type 2 diabetes mellitus without complications: Secondary | ICD-10-CM | POA: Diagnosis not present

## 2016-04-24 DIAGNOSIS — D631 Anemia in chronic kidney disease: Secondary | ICD-10-CM | POA: Diagnosis not present

## 2016-04-26 DIAGNOSIS — E1129 Type 2 diabetes mellitus with other diabetic kidney complication: Secondary | ICD-10-CM | POA: Diagnosis not present

## 2016-04-26 DIAGNOSIS — Z992 Dependence on renal dialysis: Secondary | ICD-10-CM | POA: Diagnosis not present

## 2016-04-26 DIAGNOSIS — N186 End stage renal disease: Secondary | ICD-10-CM | POA: Diagnosis not present

## 2016-04-27 DIAGNOSIS — N186 End stage renal disease: Secondary | ICD-10-CM | POA: Diagnosis not present

## 2016-04-27 DIAGNOSIS — N2581 Secondary hyperparathyroidism of renal origin: Secondary | ICD-10-CM | POA: Diagnosis not present

## 2016-04-27 DIAGNOSIS — E119 Type 2 diabetes mellitus without complications: Secondary | ICD-10-CM | POA: Diagnosis not present

## 2016-04-29 DIAGNOSIS — N186 End stage renal disease: Secondary | ICD-10-CM | POA: Diagnosis not present

## 2016-04-29 DIAGNOSIS — N2581 Secondary hyperparathyroidism of renal origin: Secondary | ICD-10-CM | POA: Diagnosis not present

## 2016-04-29 DIAGNOSIS — E119 Type 2 diabetes mellitus without complications: Secondary | ICD-10-CM | POA: Diagnosis not present

## 2016-05-01 DIAGNOSIS — N186 End stage renal disease: Secondary | ICD-10-CM | POA: Diagnosis not present

## 2016-05-01 DIAGNOSIS — N2581 Secondary hyperparathyroidism of renal origin: Secondary | ICD-10-CM | POA: Diagnosis not present

## 2016-05-01 DIAGNOSIS — E119 Type 2 diabetes mellitus without complications: Secondary | ICD-10-CM | POA: Diagnosis not present

## 2016-05-04 DIAGNOSIS — N186 End stage renal disease: Secondary | ICD-10-CM | POA: Diagnosis not present

## 2016-05-04 DIAGNOSIS — E119 Type 2 diabetes mellitus without complications: Secondary | ICD-10-CM | POA: Diagnosis not present

## 2016-05-04 DIAGNOSIS — N2581 Secondary hyperparathyroidism of renal origin: Secondary | ICD-10-CM | POA: Diagnosis not present

## 2016-05-06 DIAGNOSIS — E119 Type 2 diabetes mellitus without complications: Secondary | ICD-10-CM | POA: Diagnosis not present

## 2016-05-06 DIAGNOSIS — N2581 Secondary hyperparathyroidism of renal origin: Secondary | ICD-10-CM | POA: Diagnosis not present

## 2016-05-06 DIAGNOSIS — N186 End stage renal disease: Secondary | ICD-10-CM | POA: Diagnosis not present

## 2016-05-07 ENCOUNTER — Ambulatory Visit (INDEPENDENT_AMBULATORY_CARE_PROVIDER_SITE_OTHER): Payer: Medicare Other | Admitting: Ophthalmology

## 2016-05-07 DIAGNOSIS — I10 Essential (primary) hypertension: Secondary | ICD-10-CM

## 2016-05-07 DIAGNOSIS — E113513 Type 2 diabetes mellitus with proliferative diabetic retinopathy with macular edema, bilateral: Secondary | ICD-10-CM | POA: Diagnosis not present

## 2016-05-07 DIAGNOSIS — E11311 Type 2 diabetes mellitus with unspecified diabetic retinopathy with macular edema: Secondary | ICD-10-CM | POA: Diagnosis not present

## 2016-05-07 DIAGNOSIS — H35033 Hypertensive retinopathy, bilateral: Secondary | ICD-10-CM

## 2016-05-08 DIAGNOSIS — E119 Type 2 diabetes mellitus without complications: Secondary | ICD-10-CM | POA: Diagnosis not present

## 2016-05-08 DIAGNOSIS — N186 End stage renal disease: Secondary | ICD-10-CM | POA: Diagnosis not present

## 2016-05-08 DIAGNOSIS — N2581 Secondary hyperparathyroidism of renal origin: Secondary | ICD-10-CM | POA: Diagnosis not present

## 2016-05-11 DIAGNOSIS — N186 End stage renal disease: Secondary | ICD-10-CM | POA: Diagnosis not present

## 2016-05-11 DIAGNOSIS — N2581 Secondary hyperparathyroidism of renal origin: Secondary | ICD-10-CM | POA: Diagnosis not present

## 2016-05-11 DIAGNOSIS — E119 Type 2 diabetes mellitus without complications: Secondary | ICD-10-CM | POA: Diagnosis not present

## 2016-05-13 DIAGNOSIS — N2581 Secondary hyperparathyroidism of renal origin: Secondary | ICD-10-CM | POA: Diagnosis not present

## 2016-05-13 DIAGNOSIS — E119 Type 2 diabetes mellitus without complications: Secondary | ICD-10-CM | POA: Diagnosis not present

## 2016-05-13 DIAGNOSIS — N186 End stage renal disease: Secondary | ICD-10-CM | POA: Diagnosis not present

## 2016-05-15 DIAGNOSIS — N2581 Secondary hyperparathyroidism of renal origin: Secondary | ICD-10-CM | POA: Diagnosis not present

## 2016-05-15 DIAGNOSIS — E119 Type 2 diabetes mellitus without complications: Secondary | ICD-10-CM | POA: Diagnosis not present

## 2016-05-15 DIAGNOSIS — N186 End stage renal disease: Secondary | ICD-10-CM | POA: Diagnosis not present

## 2016-05-18 DIAGNOSIS — N186 End stage renal disease: Secondary | ICD-10-CM | POA: Diagnosis not present

## 2016-05-18 DIAGNOSIS — N2581 Secondary hyperparathyroidism of renal origin: Secondary | ICD-10-CM | POA: Diagnosis not present

## 2016-05-18 DIAGNOSIS — E119 Type 2 diabetes mellitus without complications: Secondary | ICD-10-CM | POA: Diagnosis not present

## 2016-05-20 DIAGNOSIS — N2581 Secondary hyperparathyroidism of renal origin: Secondary | ICD-10-CM | POA: Diagnosis not present

## 2016-05-20 DIAGNOSIS — E119 Type 2 diabetes mellitus without complications: Secondary | ICD-10-CM | POA: Diagnosis not present

## 2016-05-20 DIAGNOSIS — N186 End stage renal disease: Secondary | ICD-10-CM | POA: Diagnosis not present

## 2016-05-22 DIAGNOSIS — E119 Type 2 diabetes mellitus without complications: Secondary | ICD-10-CM | POA: Diagnosis not present

## 2016-05-22 DIAGNOSIS — N186 End stage renal disease: Secondary | ICD-10-CM | POA: Diagnosis not present

## 2016-05-22 DIAGNOSIS — N2581 Secondary hyperparathyroidism of renal origin: Secondary | ICD-10-CM | POA: Diagnosis not present

## 2016-05-25 DIAGNOSIS — N186 End stage renal disease: Secondary | ICD-10-CM | POA: Diagnosis not present

## 2016-05-25 DIAGNOSIS — E119 Type 2 diabetes mellitus without complications: Secondary | ICD-10-CM | POA: Diagnosis not present

## 2016-05-25 DIAGNOSIS — N2581 Secondary hyperparathyroidism of renal origin: Secondary | ICD-10-CM | POA: Diagnosis not present

## 2016-05-27 DIAGNOSIS — N186 End stage renal disease: Secondary | ICD-10-CM | POA: Diagnosis not present

## 2016-05-27 DIAGNOSIS — N2581 Secondary hyperparathyroidism of renal origin: Secondary | ICD-10-CM | POA: Diagnosis not present

## 2016-05-27 DIAGNOSIS — E119 Type 2 diabetes mellitus without complications: Secondary | ICD-10-CM | POA: Diagnosis not present

## 2016-05-27 DIAGNOSIS — E1129 Type 2 diabetes mellitus with other diabetic kidney complication: Secondary | ICD-10-CM | POA: Diagnosis not present

## 2016-05-27 DIAGNOSIS — Z992 Dependence on renal dialysis: Secondary | ICD-10-CM | POA: Diagnosis not present

## 2016-05-29 DIAGNOSIS — N186 End stage renal disease: Secondary | ICD-10-CM | POA: Diagnosis not present

## 2016-05-29 DIAGNOSIS — N2581 Secondary hyperparathyroidism of renal origin: Secondary | ICD-10-CM | POA: Diagnosis not present

## 2016-05-29 DIAGNOSIS — E119 Type 2 diabetes mellitus without complications: Secondary | ICD-10-CM | POA: Diagnosis not present

## 2016-06-01 DIAGNOSIS — N186 End stage renal disease: Secondary | ICD-10-CM | POA: Diagnosis not present

## 2016-06-01 DIAGNOSIS — E119 Type 2 diabetes mellitus without complications: Secondary | ICD-10-CM | POA: Diagnosis not present

## 2016-06-01 DIAGNOSIS — N2581 Secondary hyperparathyroidism of renal origin: Secondary | ICD-10-CM | POA: Diagnosis not present

## 2016-06-03 DIAGNOSIS — N186 End stage renal disease: Secondary | ICD-10-CM | POA: Diagnosis not present

## 2016-06-03 DIAGNOSIS — N2581 Secondary hyperparathyroidism of renal origin: Secondary | ICD-10-CM | POA: Diagnosis not present

## 2016-06-03 DIAGNOSIS — E119 Type 2 diabetes mellitus without complications: Secondary | ICD-10-CM | POA: Diagnosis not present

## 2016-06-05 DIAGNOSIS — E119 Type 2 diabetes mellitus without complications: Secondary | ICD-10-CM | POA: Diagnosis not present

## 2016-06-05 DIAGNOSIS — N186 End stage renal disease: Secondary | ICD-10-CM | POA: Diagnosis not present

## 2016-06-05 DIAGNOSIS — N2581 Secondary hyperparathyroidism of renal origin: Secondary | ICD-10-CM | POA: Diagnosis not present

## 2016-06-08 DIAGNOSIS — N186 End stage renal disease: Secondary | ICD-10-CM | POA: Diagnosis not present

## 2016-06-08 DIAGNOSIS — N2581 Secondary hyperparathyroidism of renal origin: Secondary | ICD-10-CM | POA: Diagnosis not present

## 2016-06-08 DIAGNOSIS — E119 Type 2 diabetes mellitus without complications: Secondary | ICD-10-CM | POA: Diagnosis not present

## 2016-06-10 DIAGNOSIS — N2581 Secondary hyperparathyroidism of renal origin: Secondary | ICD-10-CM | POA: Diagnosis not present

## 2016-06-10 DIAGNOSIS — N186 End stage renal disease: Secondary | ICD-10-CM | POA: Diagnosis not present

## 2016-06-10 DIAGNOSIS — E119 Type 2 diabetes mellitus without complications: Secondary | ICD-10-CM | POA: Diagnosis not present

## 2016-06-12 DIAGNOSIS — N2581 Secondary hyperparathyroidism of renal origin: Secondary | ICD-10-CM | POA: Diagnosis not present

## 2016-06-12 DIAGNOSIS — N186 End stage renal disease: Secondary | ICD-10-CM | POA: Diagnosis not present

## 2016-06-12 DIAGNOSIS — E119 Type 2 diabetes mellitus without complications: Secondary | ICD-10-CM | POA: Diagnosis not present

## 2016-06-15 DIAGNOSIS — N186 End stage renal disease: Secondary | ICD-10-CM | POA: Diagnosis not present

## 2016-06-15 DIAGNOSIS — N2581 Secondary hyperparathyroidism of renal origin: Secondary | ICD-10-CM | POA: Diagnosis not present

## 2016-06-15 DIAGNOSIS — E119 Type 2 diabetes mellitus without complications: Secondary | ICD-10-CM | POA: Diagnosis not present

## 2016-06-17 DIAGNOSIS — N2581 Secondary hyperparathyroidism of renal origin: Secondary | ICD-10-CM | POA: Diagnosis not present

## 2016-06-17 DIAGNOSIS — N186 End stage renal disease: Secondary | ICD-10-CM | POA: Diagnosis not present

## 2016-06-17 DIAGNOSIS — E119 Type 2 diabetes mellitus without complications: Secondary | ICD-10-CM | POA: Diagnosis not present

## 2016-06-19 DIAGNOSIS — N186 End stage renal disease: Secondary | ICD-10-CM | POA: Diagnosis not present

## 2016-06-19 DIAGNOSIS — N2581 Secondary hyperparathyroidism of renal origin: Secondary | ICD-10-CM | POA: Diagnosis not present

## 2016-06-19 DIAGNOSIS — E119 Type 2 diabetes mellitus without complications: Secondary | ICD-10-CM | POA: Diagnosis not present

## 2016-06-22 DIAGNOSIS — N186 End stage renal disease: Secondary | ICD-10-CM | POA: Diagnosis not present

## 2016-06-22 DIAGNOSIS — N2581 Secondary hyperparathyroidism of renal origin: Secondary | ICD-10-CM | POA: Diagnosis not present

## 2016-06-22 DIAGNOSIS — E119 Type 2 diabetes mellitus without complications: Secondary | ICD-10-CM | POA: Diagnosis not present

## 2016-06-24 DIAGNOSIS — E119 Type 2 diabetes mellitus without complications: Secondary | ICD-10-CM | POA: Diagnosis not present

## 2016-06-24 DIAGNOSIS — N2581 Secondary hyperparathyroidism of renal origin: Secondary | ICD-10-CM | POA: Diagnosis not present

## 2016-06-24 DIAGNOSIS — N186 End stage renal disease: Secondary | ICD-10-CM | POA: Diagnosis not present

## 2016-06-26 DIAGNOSIS — E119 Type 2 diabetes mellitus without complications: Secondary | ICD-10-CM | POA: Diagnosis not present

## 2016-06-26 DIAGNOSIS — Z992 Dependence on renal dialysis: Secondary | ICD-10-CM | POA: Diagnosis not present

## 2016-06-26 DIAGNOSIS — N186 End stage renal disease: Secondary | ICD-10-CM | POA: Diagnosis not present

## 2016-06-26 DIAGNOSIS — N2581 Secondary hyperparathyroidism of renal origin: Secondary | ICD-10-CM | POA: Diagnosis not present

## 2016-06-26 DIAGNOSIS — E1129 Type 2 diabetes mellitus with other diabetic kidney complication: Secondary | ICD-10-CM | POA: Diagnosis not present

## 2016-06-29 DIAGNOSIS — N186 End stage renal disease: Secondary | ICD-10-CM | POA: Diagnosis not present

## 2016-06-29 DIAGNOSIS — E119 Type 2 diabetes mellitus without complications: Secondary | ICD-10-CM | POA: Diagnosis not present

## 2016-06-29 DIAGNOSIS — N2581 Secondary hyperparathyroidism of renal origin: Secondary | ICD-10-CM | POA: Diagnosis not present

## 2016-07-01 DIAGNOSIS — N186 End stage renal disease: Secondary | ICD-10-CM | POA: Diagnosis not present

## 2016-07-01 DIAGNOSIS — E119 Type 2 diabetes mellitus without complications: Secondary | ICD-10-CM | POA: Diagnosis not present

## 2016-07-01 DIAGNOSIS — N2581 Secondary hyperparathyroidism of renal origin: Secondary | ICD-10-CM | POA: Diagnosis not present

## 2016-07-03 DIAGNOSIS — N2581 Secondary hyperparathyroidism of renal origin: Secondary | ICD-10-CM | POA: Diagnosis not present

## 2016-07-03 DIAGNOSIS — E119 Type 2 diabetes mellitus without complications: Secondary | ICD-10-CM | POA: Diagnosis not present

## 2016-07-03 DIAGNOSIS — N186 End stage renal disease: Secondary | ICD-10-CM | POA: Diagnosis not present

## 2016-07-06 DIAGNOSIS — E119 Type 2 diabetes mellitus without complications: Secondary | ICD-10-CM | POA: Diagnosis not present

## 2016-07-06 DIAGNOSIS — N2581 Secondary hyperparathyroidism of renal origin: Secondary | ICD-10-CM | POA: Diagnosis not present

## 2016-07-06 DIAGNOSIS — N186 End stage renal disease: Secondary | ICD-10-CM | POA: Diagnosis not present

## 2016-07-08 DIAGNOSIS — N2581 Secondary hyperparathyroidism of renal origin: Secondary | ICD-10-CM | POA: Diagnosis not present

## 2016-07-08 DIAGNOSIS — E119 Type 2 diabetes mellitus without complications: Secondary | ICD-10-CM | POA: Diagnosis not present

## 2016-07-08 DIAGNOSIS — N186 End stage renal disease: Secondary | ICD-10-CM | POA: Diagnosis not present

## 2016-07-10 DIAGNOSIS — N186 End stage renal disease: Secondary | ICD-10-CM | POA: Diagnosis not present

## 2016-07-10 DIAGNOSIS — N2581 Secondary hyperparathyroidism of renal origin: Secondary | ICD-10-CM | POA: Diagnosis not present

## 2016-07-10 DIAGNOSIS — E119 Type 2 diabetes mellitus without complications: Secondary | ICD-10-CM | POA: Diagnosis not present

## 2016-07-13 DIAGNOSIS — N2581 Secondary hyperparathyroidism of renal origin: Secondary | ICD-10-CM | POA: Diagnosis not present

## 2016-07-13 DIAGNOSIS — E119 Type 2 diabetes mellitus without complications: Secondary | ICD-10-CM | POA: Diagnosis not present

## 2016-07-13 DIAGNOSIS — N186 End stage renal disease: Secondary | ICD-10-CM | POA: Diagnosis not present

## 2016-07-15 DIAGNOSIS — E119 Type 2 diabetes mellitus without complications: Secondary | ICD-10-CM | POA: Diagnosis not present

## 2016-07-15 DIAGNOSIS — N2581 Secondary hyperparathyroidism of renal origin: Secondary | ICD-10-CM | POA: Diagnosis not present

## 2016-07-15 DIAGNOSIS — N186 End stage renal disease: Secondary | ICD-10-CM | POA: Diagnosis not present

## 2016-07-17 DIAGNOSIS — N2581 Secondary hyperparathyroidism of renal origin: Secondary | ICD-10-CM | POA: Diagnosis not present

## 2016-07-17 DIAGNOSIS — E119 Type 2 diabetes mellitus without complications: Secondary | ICD-10-CM | POA: Diagnosis not present

## 2016-07-17 DIAGNOSIS — N186 End stage renal disease: Secondary | ICD-10-CM | POA: Diagnosis not present

## 2016-07-20 DIAGNOSIS — N2581 Secondary hyperparathyroidism of renal origin: Secondary | ICD-10-CM | POA: Diagnosis not present

## 2016-07-20 DIAGNOSIS — N186 End stage renal disease: Secondary | ICD-10-CM | POA: Diagnosis not present

## 2016-07-20 DIAGNOSIS — E119 Type 2 diabetes mellitus without complications: Secondary | ICD-10-CM | POA: Diagnosis not present

## 2016-07-22 DIAGNOSIS — N186 End stage renal disease: Secondary | ICD-10-CM | POA: Diagnosis not present

## 2016-07-22 DIAGNOSIS — N2581 Secondary hyperparathyroidism of renal origin: Secondary | ICD-10-CM | POA: Diagnosis not present

## 2016-07-22 DIAGNOSIS — E119 Type 2 diabetes mellitus without complications: Secondary | ICD-10-CM | POA: Diagnosis not present

## 2016-07-24 DIAGNOSIS — E119 Type 2 diabetes mellitus without complications: Secondary | ICD-10-CM | POA: Diagnosis not present

## 2016-07-24 DIAGNOSIS — N186 End stage renal disease: Secondary | ICD-10-CM | POA: Diagnosis not present

## 2016-07-24 DIAGNOSIS — N2581 Secondary hyperparathyroidism of renal origin: Secondary | ICD-10-CM | POA: Diagnosis not present

## 2016-07-27 DIAGNOSIS — E119 Type 2 diabetes mellitus without complications: Secondary | ICD-10-CM | POA: Diagnosis not present

## 2016-07-27 DIAGNOSIS — E1129 Type 2 diabetes mellitus with other diabetic kidney complication: Secondary | ICD-10-CM | POA: Diagnosis not present

## 2016-07-27 DIAGNOSIS — N186 End stage renal disease: Secondary | ICD-10-CM | POA: Diagnosis not present

## 2016-07-27 DIAGNOSIS — N2581 Secondary hyperparathyroidism of renal origin: Secondary | ICD-10-CM | POA: Diagnosis not present

## 2016-07-27 DIAGNOSIS — Z992 Dependence on renal dialysis: Secondary | ICD-10-CM | POA: Diagnosis not present

## 2016-07-29 DIAGNOSIS — N2581 Secondary hyperparathyroidism of renal origin: Secondary | ICD-10-CM | POA: Diagnosis not present

## 2016-07-29 DIAGNOSIS — D631 Anemia in chronic kidney disease: Secondary | ICD-10-CM | POA: Diagnosis not present

## 2016-07-29 DIAGNOSIS — N186 End stage renal disease: Secondary | ICD-10-CM | POA: Diagnosis not present

## 2016-07-29 DIAGNOSIS — E119 Type 2 diabetes mellitus without complications: Secondary | ICD-10-CM | POA: Diagnosis not present

## 2016-07-31 DIAGNOSIS — N186 End stage renal disease: Secondary | ICD-10-CM | POA: Diagnosis not present

## 2016-07-31 DIAGNOSIS — N2581 Secondary hyperparathyroidism of renal origin: Secondary | ICD-10-CM | POA: Diagnosis not present

## 2016-07-31 DIAGNOSIS — D631 Anemia in chronic kidney disease: Secondary | ICD-10-CM | POA: Diagnosis not present

## 2016-07-31 DIAGNOSIS — E119 Type 2 diabetes mellitus without complications: Secondary | ICD-10-CM | POA: Diagnosis not present

## 2016-08-03 DIAGNOSIS — N186 End stage renal disease: Secondary | ICD-10-CM | POA: Diagnosis not present

## 2016-08-03 DIAGNOSIS — N2581 Secondary hyperparathyroidism of renal origin: Secondary | ICD-10-CM | POA: Diagnosis not present

## 2016-08-03 DIAGNOSIS — E119 Type 2 diabetes mellitus without complications: Secondary | ICD-10-CM | POA: Diagnosis not present

## 2016-08-03 DIAGNOSIS — D631 Anemia in chronic kidney disease: Secondary | ICD-10-CM | POA: Diagnosis not present

## 2016-08-05 DIAGNOSIS — D631 Anemia in chronic kidney disease: Secondary | ICD-10-CM | POA: Diagnosis not present

## 2016-08-05 DIAGNOSIS — E119 Type 2 diabetes mellitus without complications: Secondary | ICD-10-CM | POA: Diagnosis not present

## 2016-08-05 DIAGNOSIS — N186 End stage renal disease: Secondary | ICD-10-CM | POA: Diagnosis not present

## 2016-08-05 DIAGNOSIS — N2581 Secondary hyperparathyroidism of renal origin: Secondary | ICD-10-CM | POA: Diagnosis not present

## 2016-08-07 DIAGNOSIS — E119 Type 2 diabetes mellitus without complications: Secondary | ICD-10-CM | POA: Diagnosis not present

## 2016-08-07 DIAGNOSIS — D631 Anemia in chronic kidney disease: Secondary | ICD-10-CM | POA: Diagnosis not present

## 2016-08-07 DIAGNOSIS — N186 End stage renal disease: Secondary | ICD-10-CM | POA: Diagnosis not present

## 2016-08-07 DIAGNOSIS — N2581 Secondary hyperparathyroidism of renal origin: Secondary | ICD-10-CM | POA: Diagnosis not present

## 2016-08-10 DIAGNOSIS — N186 End stage renal disease: Secondary | ICD-10-CM | POA: Diagnosis not present

## 2016-08-10 DIAGNOSIS — E119 Type 2 diabetes mellitus without complications: Secondary | ICD-10-CM | POA: Diagnosis not present

## 2016-08-10 DIAGNOSIS — N2581 Secondary hyperparathyroidism of renal origin: Secondary | ICD-10-CM | POA: Diagnosis not present

## 2016-08-10 DIAGNOSIS — D631 Anemia in chronic kidney disease: Secondary | ICD-10-CM | POA: Diagnosis not present

## 2016-08-12 DIAGNOSIS — D631 Anemia in chronic kidney disease: Secondary | ICD-10-CM | POA: Diagnosis not present

## 2016-08-12 DIAGNOSIS — E119 Type 2 diabetes mellitus without complications: Secondary | ICD-10-CM | POA: Diagnosis not present

## 2016-08-12 DIAGNOSIS — N186 End stage renal disease: Secondary | ICD-10-CM | POA: Diagnosis not present

## 2016-08-12 DIAGNOSIS — N2581 Secondary hyperparathyroidism of renal origin: Secondary | ICD-10-CM | POA: Diagnosis not present

## 2016-08-14 DIAGNOSIS — N186 End stage renal disease: Secondary | ICD-10-CM | POA: Diagnosis not present

## 2016-08-14 DIAGNOSIS — D631 Anemia in chronic kidney disease: Secondary | ICD-10-CM | POA: Diagnosis not present

## 2016-08-14 DIAGNOSIS — N2581 Secondary hyperparathyroidism of renal origin: Secondary | ICD-10-CM | POA: Diagnosis not present

## 2016-08-14 DIAGNOSIS — E119 Type 2 diabetes mellitus without complications: Secondary | ICD-10-CM | POA: Diagnosis not present

## 2016-08-17 DIAGNOSIS — N186 End stage renal disease: Secondary | ICD-10-CM | POA: Diagnosis not present

## 2016-08-17 DIAGNOSIS — E119 Type 2 diabetes mellitus without complications: Secondary | ICD-10-CM | POA: Diagnosis not present

## 2016-08-17 DIAGNOSIS — N2581 Secondary hyperparathyroidism of renal origin: Secondary | ICD-10-CM | POA: Diagnosis not present

## 2016-08-17 DIAGNOSIS — D631 Anemia in chronic kidney disease: Secondary | ICD-10-CM | POA: Diagnosis not present

## 2016-08-19 DIAGNOSIS — N186 End stage renal disease: Secondary | ICD-10-CM | POA: Diagnosis not present

## 2016-08-19 DIAGNOSIS — N2581 Secondary hyperparathyroidism of renal origin: Secondary | ICD-10-CM | POA: Diagnosis not present

## 2016-08-19 DIAGNOSIS — E119 Type 2 diabetes mellitus without complications: Secondary | ICD-10-CM | POA: Diagnosis not present

## 2016-08-19 DIAGNOSIS — D631 Anemia in chronic kidney disease: Secondary | ICD-10-CM | POA: Diagnosis not present

## 2016-08-21 DIAGNOSIS — D631 Anemia in chronic kidney disease: Secondary | ICD-10-CM | POA: Diagnosis not present

## 2016-08-21 DIAGNOSIS — N2581 Secondary hyperparathyroidism of renal origin: Secondary | ICD-10-CM | POA: Diagnosis not present

## 2016-08-21 DIAGNOSIS — N186 End stage renal disease: Secondary | ICD-10-CM | POA: Diagnosis not present

## 2016-08-21 DIAGNOSIS — E119 Type 2 diabetes mellitus without complications: Secondary | ICD-10-CM | POA: Diagnosis not present

## 2016-08-24 DIAGNOSIS — N2581 Secondary hyperparathyroidism of renal origin: Secondary | ICD-10-CM | POA: Diagnosis not present

## 2016-08-24 DIAGNOSIS — N186 End stage renal disease: Secondary | ICD-10-CM | POA: Diagnosis not present

## 2016-08-24 DIAGNOSIS — E119 Type 2 diabetes mellitus without complications: Secondary | ICD-10-CM | POA: Diagnosis not present

## 2016-08-24 DIAGNOSIS — D631 Anemia in chronic kidney disease: Secondary | ICD-10-CM | POA: Diagnosis not present

## 2016-08-26 DIAGNOSIS — N186 End stage renal disease: Secondary | ICD-10-CM | POA: Diagnosis not present

## 2016-08-26 DIAGNOSIS — N2581 Secondary hyperparathyroidism of renal origin: Secondary | ICD-10-CM | POA: Diagnosis not present

## 2016-08-26 DIAGNOSIS — D631 Anemia in chronic kidney disease: Secondary | ICD-10-CM | POA: Diagnosis not present

## 2016-08-26 DIAGNOSIS — E119 Type 2 diabetes mellitus without complications: Secondary | ICD-10-CM | POA: Diagnosis not present

## 2016-08-27 DIAGNOSIS — N186 End stage renal disease: Secondary | ICD-10-CM | POA: Diagnosis not present

## 2016-08-27 DIAGNOSIS — Z992 Dependence on renal dialysis: Secondary | ICD-10-CM | POA: Diagnosis not present

## 2016-08-27 DIAGNOSIS — E1129 Type 2 diabetes mellitus with other diabetic kidney complication: Secondary | ICD-10-CM | POA: Diagnosis not present

## 2016-08-28 DIAGNOSIS — Z23 Encounter for immunization: Secondary | ICD-10-CM | POA: Diagnosis not present

## 2016-08-28 DIAGNOSIS — N186 End stage renal disease: Secondary | ICD-10-CM | POA: Diagnosis not present

## 2016-08-28 DIAGNOSIS — D631 Anemia in chronic kidney disease: Secondary | ICD-10-CM | POA: Diagnosis not present

## 2016-08-28 DIAGNOSIS — E119 Type 2 diabetes mellitus without complications: Secondary | ICD-10-CM | POA: Diagnosis not present

## 2016-08-28 DIAGNOSIS — N2581 Secondary hyperparathyroidism of renal origin: Secondary | ICD-10-CM | POA: Diagnosis not present

## 2016-08-31 DIAGNOSIS — N2581 Secondary hyperparathyroidism of renal origin: Secondary | ICD-10-CM | POA: Diagnosis not present

## 2016-08-31 DIAGNOSIS — E119 Type 2 diabetes mellitus without complications: Secondary | ICD-10-CM | POA: Diagnosis not present

## 2016-08-31 DIAGNOSIS — Z23 Encounter for immunization: Secondary | ICD-10-CM | POA: Diagnosis not present

## 2016-08-31 DIAGNOSIS — N186 End stage renal disease: Secondary | ICD-10-CM | POA: Diagnosis not present

## 2016-08-31 DIAGNOSIS — D631 Anemia in chronic kidney disease: Secondary | ICD-10-CM | POA: Diagnosis not present

## 2016-09-02 DIAGNOSIS — N2581 Secondary hyperparathyroidism of renal origin: Secondary | ICD-10-CM | POA: Diagnosis not present

## 2016-09-02 DIAGNOSIS — E119 Type 2 diabetes mellitus without complications: Secondary | ICD-10-CM | POA: Diagnosis not present

## 2016-09-02 DIAGNOSIS — N186 End stage renal disease: Secondary | ICD-10-CM | POA: Diagnosis not present

## 2016-09-02 DIAGNOSIS — D631 Anemia in chronic kidney disease: Secondary | ICD-10-CM | POA: Diagnosis not present

## 2016-09-02 DIAGNOSIS — Z23 Encounter for immunization: Secondary | ICD-10-CM | POA: Diagnosis not present

## 2016-09-04 DIAGNOSIS — N2581 Secondary hyperparathyroidism of renal origin: Secondary | ICD-10-CM | POA: Diagnosis not present

## 2016-09-04 DIAGNOSIS — Z23 Encounter for immunization: Secondary | ICD-10-CM | POA: Diagnosis not present

## 2016-09-04 DIAGNOSIS — N186 End stage renal disease: Secondary | ICD-10-CM | POA: Diagnosis not present

## 2016-09-04 DIAGNOSIS — D631 Anemia in chronic kidney disease: Secondary | ICD-10-CM | POA: Diagnosis not present

## 2016-09-04 DIAGNOSIS — E119 Type 2 diabetes mellitus without complications: Secondary | ICD-10-CM | POA: Diagnosis not present

## 2016-09-07 DIAGNOSIS — Z23 Encounter for immunization: Secondary | ICD-10-CM | POA: Diagnosis not present

## 2016-09-07 DIAGNOSIS — N2581 Secondary hyperparathyroidism of renal origin: Secondary | ICD-10-CM | POA: Diagnosis not present

## 2016-09-07 DIAGNOSIS — N186 End stage renal disease: Secondary | ICD-10-CM | POA: Diagnosis not present

## 2016-09-07 DIAGNOSIS — D631 Anemia in chronic kidney disease: Secondary | ICD-10-CM | POA: Diagnosis not present

## 2016-09-07 DIAGNOSIS — E119 Type 2 diabetes mellitus without complications: Secondary | ICD-10-CM | POA: Diagnosis not present

## 2016-09-09 DIAGNOSIS — Z23 Encounter for immunization: Secondary | ICD-10-CM | POA: Diagnosis not present

## 2016-09-09 DIAGNOSIS — D631 Anemia in chronic kidney disease: Secondary | ICD-10-CM | POA: Diagnosis not present

## 2016-09-09 DIAGNOSIS — N2581 Secondary hyperparathyroidism of renal origin: Secondary | ICD-10-CM | POA: Diagnosis not present

## 2016-09-09 DIAGNOSIS — N186 End stage renal disease: Secondary | ICD-10-CM | POA: Diagnosis not present

## 2016-09-09 DIAGNOSIS — E119 Type 2 diabetes mellitus without complications: Secondary | ICD-10-CM | POA: Diagnosis not present

## 2016-09-11 DIAGNOSIS — N186 End stage renal disease: Secondary | ICD-10-CM | POA: Diagnosis not present

## 2016-09-11 DIAGNOSIS — E119 Type 2 diabetes mellitus without complications: Secondary | ICD-10-CM | POA: Diagnosis not present

## 2016-09-11 DIAGNOSIS — D631 Anemia in chronic kidney disease: Secondary | ICD-10-CM | POA: Diagnosis not present

## 2016-09-11 DIAGNOSIS — N2581 Secondary hyperparathyroidism of renal origin: Secondary | ICD-10-CM | POA: Diagnosis not present

## 2016-09-11 DIAGNOSIS — Z23 Encounter for immunization: Secondary | ICD-10-CM | POA: Diagnosis not present

## 2016-09-14 DIAGNOSIS — Z23 Encounter for immunization: Secondary | ICD-10-CM | POA: Diagnosis not present

## 2016-09-14 DIAGNOSIS — D631 Anemia in chronic kidney disease: Secondary | ICD-10-CM | POA: Diagnosis not present

## 2016-09-14 DIAGNOSIS — E119 Type 2 diabetes mellitus without complications: Secondary | ICD-10-CM | POA: Diagnosis not present

## 2016-09-14 DIAGNOSIS — N2581 Secondary hyperparathyroidism of renal origin: Secondary | ICD-10-CM | POA: Diagnosis not present

## 2016-09-14 DIAGNOSIS — N186 End stage renal disease: Secondary | ICD-10-CM | POA: Diagnosis not present

## 2016-09-16 DIAGNOSIS — D631 Anemia in chronic kidney disease: Secondary | ICD-10-CM | POA: Diagnosis not present

## 2016-09-16 DIAGNOSIS — Z23 Encounter for immunization: Secondary | ICD-10-CM | POA: Diagnosis not present

## 2016-09-16 DIAGNOSIS — N186 End stage renal disease: Secondary | ICD-10-CM | POA: Diagnosis not present

## 2016-09-16 DIAGNOSIS — N2581 Secondary hyperparathyroidism of renal origin: Secondary | ICD-10-CM | POA: Diagnosis not present

## 2016-09-16 DIAGNOSIS — E119 Type 2 diabetes mellitus without complications: Secondary | ICD-10-CM | POA: Diagnosis not present

## 2016-09-18 DIAGNOSIS — E119 Type 2 diabetes mellitus without complications: Secondary | ICD-10-CM | POA: Diagnosis not present

## 2016-09-18 DIAGNOSIS — Z23 Encounter for immunization: Secondary | ICD-10-CM | POA: Diagnosis not present

## 2016-09-18 DIAGNOSIS — N2581 Secondary hyperparathyroidism of renal origin: Secondary | ICD-10-CM | POA: Diagnosis not present

## 2016-09-18 DIAGNOSIS — D631 Anemia in chronic kidney disease: Secondary | ICD-10-CM | POA: Diagnosis not present

## 2016-09-18 DIAGNOSIS — N186 End stage renal disease: Secondary | ICD-10-CM | POA: Diagnosis not present

## 2016-09-21 DIAGNOSIS — N186 End stage renal disease: Secondary | ICD-10-CM | POA: Diagnosis not present

## 2016-09-21 DIAGNOSIS — Z23 Encounter for immunization: Secondary | ICD-10-CM | POA: Diagnosis not present

## 2016-09-21 DIAGNOSIS — D631 Anemia in chronic kidney disease: Secondary | ICD-10-CM | POA: Diagnosis not present

## 2016-09-21 DIAGNOSIS — N2581 Secondary hyperparathyroidism of renal origin: Secondary | ICD-10-CM | POA: Diagnosis not present

## 2016-09-21 DIAGNOSIS — E119 Type 2 diabetes mellitus without complications: Secondary | ICD-10-CM | POA: Diagnosis not present

## 2016-09-22 DIAGNOSIS — T82858D Stenosis of vascular prosthetic devices, implants and grafts, subsequent encounter: Secondary | ICD-10-CM | POA: Diagnosis not present

## 2016-09-22 DIAGNOSIS — N186 End stage renal disease: Secondary | ICD-10-CM | POA: Diagnosis not present

## 2016-09-22 DIAGNOSIS — Z992 Dependence on renal dialysis: Secondary | ICD-10-CM | POA: Diagnosis not present

## 2016-09-22 DIAGNOSIS — I871 Compression of vein: Secondary | ICD-10-CM | POA: Diagnosis not present

## 2016-09-23 DIAGNOSIS — N186 End stage renal disease: Secondary | ICD-10-CM | POA: Diagnosis not present

## 2016-09-23 DIAGNOSIS — N2581 Secondary hyperparathyroidism of renal origin: Secondary | ICD-10-CM | POA: Diagnosis not present

## 2016-09-23 DIAGNOSIS — Z23 Encounter for immunization: Secondary | ICD-10-CM | POA: Diagnosis not present

## 2016-09-23 DIAGNOSIS — D631 Anemia in chronic kidney disease: Secondary | ICD-10-CM | POA: Diagnosis not present

## 2016-09-23 DIAGNOSIS — E119 Type 2 diabetes mellitus without complications: Secondary | ICD-10-CM | POA: Diagnosis not present

## 2016-09-25 DIAGNOSIS — Z23 Encounter for immunization: Secondary | ICD-10-CM | POA: Diagnosis not present

## 2016-09-25 DIAGNOSIS — N186 End stage renal disease: Secondary | ICD-10-CM | POA: Diagnosis not present

## 2016-09-25 DIAGNOSIS — D631 Anemia in chronic kidney disease: Secondary | ICD-10-CM | POA: Diagnosis not present

## 2016-09-25 DIAGNOSIS — E119 Type 2 diabetes mellitus without complications: Secondary | ICD-10-CM | POA: Diagnosis not present

## 2016-09-25 DIAGNOSIS — N2581 Secondary hyperparathyroidism of renal origin: Secondary | ICD-10-CM | POA: Diagnosis not present

## 2016-09-26 DIAGNOSIS — Z992 Dependence on renal dialysis: Secondary | ICD-10-CM | POA: Diagnosis not present

## 2016-09-26 DIAGNOSIS — E1129 Type 2 diabetes mellitus with other diabetic kidney complication: Secondary | ICD-10-CM | POA: Diagnosis not present

## 2016-09-26 DIAGNOSIS — N186 End stage renal disease: Secondary | ICD-10-CM | POA: Diagnosis not present

## 2016-09-28 DIAGNOSIS — N2581 Secondary hyperparathyroidism of renal origin: Secondary | ICD-10-CM | POA: Diagnosis not present

## 2016-09-28 DIAGNOSIS — N186 End stage renal disease: Secondary | ICD-10-CM | POA: Diagnosis not present

## 2016-09-28 DIAGNOSIS — D631 Anemia in chronic kidney disease: Secondary | ICD-10-CM | POA: Diagnosis not present

## 2016-09-30 DIAGNOSIS — N186 End stage renal disease: Secondary | ICD-10-CM | POA: Diagnosis not present

## 2016-09-30 DIAGNOSIS — N2581 Secondary hyperparathyroidism of renal origin: Secondary | ICD-10-CM | POA: Diagnosis not present

## 2016-09-30 DIAGNOSIS — D631 Anemia in chronic kidney disease: Secondary | ICD-10-CM | POA: Diagnosis not present

## 2016-10-02 DIAGNOSIS — N186 End stage renal disease: Secondary | ICD-10-CM | POA: Diagnosis not present

## 2016-10-02 DIAGNOSIS — D631 Anemia in chronic kidney disease: Secondary | ICD-10-CM | POA: Diagnosis not present

## 2016-10-02 DIAGNOSIS — N2581 Secondary hyperparathyroidism of renal origin: Secondary | ICD-10-CM | POA: Diagnosis not present

## 2016-10-05 DIAGNOSIS — D631 Anemia in chronic kidney disease: Secondary | ICD-10-CM | POA: Diagnosis not present

## 2016-10-05 DIAGNOSIS — N2581 Secondary hyperparathyroidism of renal origin: Secondary | ICD-10-CM | POA: Diagnosis not present

## 2016-10-05 DIAGNOSIS — N186 End stage renal disease: Secondary | ICD-10-CM | POA: Diagnosis not present

## 2016-10-07 DIAGNOSIS — N186 End stage renal disease: Secondary | ICD-10-CM | POA: Diagnosis not present

## 2016-10-07 DIAGNOSIS — N2581 Secondary hyperparathyroidism of renal origin: Secondary | ICD-10-CM | POA: Diagnosis not present

## 2016-10-07 DIAGNOSIS — D631 Anemia in chronic kidney disease: Secondary | ICD-10-CM | POA: Diagnosis not present

## 2016-10-09 DIAGNOSIS — D631 Anemia in chronic kidney disease: Secondary | ICD-10-CM | POA: Diagnosis not present

## 2016-10-09 DIAGNOSIS — N186 End stage renal disease: Secondary | ICD-10-CM | POA: Diagnosis not present

## 2016-10-09 DIAGNOSIS — N2581 Secondary hyperparathyroidism of renal origin: Secondary | ICD-10-CM | POA: Diagnosis not present

## 2016-10-12 DIAGNOSIS — D631 Anemia in chronic kidney disease: Secondary | ICD-10-CM | POA: Diagnosis not present

## 2016-10-12 DIAGNOSIS — N2581 Secondary hyperparathyroidism of renal origin: Secondary | ICD-10-CM | POA: Diagnosis not present

## 2016-10-12 DIAGNOSIS — N186 End stage renal disease: Secondary | ICD-10-CM | POA: Diagnosis not present

## 2016-10-14 DIAGNOSIS — N2581 Secondary hyperparathyroidism of renal origin: Secondary | ICD-10-CM | POA: Diagnosis not present

## 2016-10-14 DIAGNOSIS — N186 End stage renal disease: Secondary | ICD-10-CM | POA: Diagnosis not present

## 2016-10-14 DIAGNOSIS — D631 Anemia in chronic kidney disease: Secondary | ICD-10-CM | POA: Diagnosis not present

## 2016-10-16 DIAGNOSIS — N2581 Secondary hyperparathyroidism of renal origin: Secondary | ICD-10-CM | POA: Diagnosis not present

## 2016-10-16 DIAGNOSIS — D631 Anemia in chronic kidney disease: Secondary | ICD-10-CM | POA: Diagnosis not present

## 2016-10-16 DIAGNOSIS — N186 End stage renal disease: Secondary | ICD-10-CM | POA: Diagnosis not present

## 2016-10-19 DIAGNOSIS — N2581 Secondary hyperparathyroidism of renal origin: Secondary | ICD-10-CM | POA: Diagnosis not present

## 2016-10-19 DIAGNOSIS — D631 Anemia in chronic kidney disease: Secondary | ICD-10-CM | POA: Diagnosis not present

## 2016-10-19 DIAGNOSIS — N186 End stage renal disease: Secondary | ICD-10-CM | POA: Diagnosis not present

## 2016-10-21 DIAGNOSIS — N2581 Secondary hyperparathyroidism of renal origin: Secondary | ICD-10-CM | POA: Diagnosis not present

## 2016-10-21 DIAGNOSIS — E119 Type 2 diabetes mellitus without complications: Secondary | ICD-10-CM | POA: Diagnosis not present

## 2016-10-21 DIAGNOSIS — N186 End stage renal disease: Secondary | ICD-10-CM | POA: Diagnosis not present

## 2016-10-21 DIAGNOSIS — D631 Anemia in chronic kidney disease: Secondary | ICD-10-CM | POA: Diagnosis not present

## 2016-10-23 DIAGNOSIS — N2581 Secondary hyperparathyroidism of renal origin: Secondary | ICD-10-CM | POA: Diagnosis not present

## 2016-10-23 DIAGNOSIS — N186 End stage renal disease: Secondary | ICD-10-CM | POA: Diagnosis not present

## 2016-10-23 DIAGNOSIS — D631 Anemia in chronic kidney disease: Secondary | ICD-10-CM | POA: Diagnosis not present

## 2016-10-26 DIAGNOSIS — N2581 Secondary hyperparathyroidism of renal origin: Secondary | ICD-10-CM | POA: Diagnosis not present

## 2016-10-26 DIAGNOSIS — N186 End stage renal disease: Secondary | ICD-10-CM | POA: Diagnosis not present

## 2016-10-26 DIAGNOSIS — D631 Anemia in chronic kidney disease: Secondary | ICD-10-CM | POA: Diagnosis not present

## 2016-10-27 DIAGNOSIS — E1129 Type 2 diabetes mellitus with other diabetic kidney complication: Secondary | ICD-10-CM | POA: Diagnosis not present

## 2016-10-27 DIAGNOSIS — Z992 Dependence on renal dialysis: Secondary | ICD-10-CM | POA: Diagnosis not present

## 2016-10-27 DIAGNOSIS — N186 End stage renal disease: Secondary | ICD-10-CM | POA: Diagnosis not present

## 2016-10-28 DIAGNOSIS — E119 Type 2 diabetes mellitus without complications: Secondary | ICD-10-CM | POA: Diagnosis not present

## 2016-10-28 DIAGNOSIS — D509 Iron deficiency anemia, unspecified: Secondary | ICD-10-CM | POA: Diagnosis not present

## 2016-10-28 DIAGNOSIS — N2581 Secondary hyperparathyroidism of renal origin: Secondary | ICD-10-CM | POA: Diagnosis not present

## 2016-10-28 DIAGNOSIS — N186 End stage renal disease: Secondary | ICD-10-CM | POA: Diagnosis not present

## 2016-10-28 DIAGNOSIS — D631 Anemia in chronic kidney disease: Secondary | ICD-10-CM | POA: Diagnosis not present

## 2016-10-30 DIAGNOSIS — N2581 Secondary hyperparathyroidism of renal origin: Secondary | ICD-10-CM | POA: Diagnosis not present

## 2016-10-30 DIAGNOSIS — E119 Type 2 diabetes mellitus without complications: Secondary | ICD-10-CM | POA: Diagnosis not present

## 2016-10-30 DIAGNOSIS — D509 Iron deficiency anemia, unspecified: Secondary | ICD-10-CM | POA: Diagnosis not present

## 2016-10-30 DIAGNOSIS — D631 Anemia in chronic kidney disease: Secondary | ICD-10-CM | POA: Diagnosis not present

## 2016-10-30 DIAGNOSIS — N186 End stage renal disease: Secondary | ICD-10-CM | POA: Diagnosis not present

## 2016-11-02 DIAGNOSIS — D631 Anemia in chronic kidney disease: Secondary | ICD-10-CM | POA: Diagnosis not present

## 2016-11-02 DIAGNOSIS — D509 Iron deficiency anemia, unspecified: Secondary | ICD-10-CM | POA: Diagnosis not present

## 2016-11-02 DIAGNOSIS — N2581 Secondary hyperparathyroidism of renal origin: Secondary | ICD-10-CM | POA: Diagnosis not present

## 2016-11-02 DIAGNOSIS — E119 Type 2 diabetes mellitus without complications: Secondary | ICD-10-CM | POA: Diagnosis not present

## 2016-11-02 DIAGNOSIS — N186 End stage renal disease: Secondary | ICD-10-CM | POA: Diagnosis not present

## 2016-11-04 DIAGNOSIS — D509 Iron deficiency anemia, unspecified: Secondary | ICD-10-CM | POA: Diagnosis not present

## 2016-11-04 DIAGNOSIS — N2581 Secondary hyperparathyroidism of renal origin: Secondary | ICD-10-CM | POA: Diagnosis not present

## 2016-11-04 DIAGNOSIS — E119 Type 2 diabetes mellitus without complications: Secondary | ICD-10-CM | POA: Diagnosis not present

## 2016-11-04 DIAGNOSIS — D631 Anemia in chronic kidney disease: Secondary | ICD-10-CM | POA: Diagnosis not present

## 2016-11-04 DIAGNOSIS — N186 End stage renal disease: Secondary | ICD-10-CM | POA: Diagnosis not present

## 2016-11-06 DIAGNOSIS — N186 End stage renal disease: Secondary | ICD-10-CM | POA: Diagnosis not present

## 2016-11-06 DIAGNOSIS — N2581 Secondary hyperparathyroidism of renal origin: Secondary | ICD-10-CM | POA: Diagnosis not present

## 2016-11-06 DIAGNOSIS — E119 Type 2 diabetes mellitus without complications: Secondary | ICD-10-CM | POA: Diagnosis not present

## 2016-11-06 DIAGNOSIS — D509 Iron deficiency anemia, unspecified: Secondary | ICD-10-CM | POA: Diagnosis not present

## 2016-11-06 DIAGNOSIS — D631 Anemia in chronic kidney disease: Secondary | ICD-10-CM | POA: Diagnosis not present

## 2016-11-09 DIAGNOSIS — E119 Type 2 diabetes mellitus without complications: Secondary | ICD-10-CM | POA: Diagnosis not present

## 2016-11-09 DIAGNOSIS — D631 Anemia in chronic kidney disease: Secondary | ICD-10-CM | POA: Diagnosis not present

## 2016-11-09 DIAGNOSIS — N2581 Secondary hyperparathyroidism of renal origin: Secondary | ICD-10-CM | POA: Diagnosis not present

## 2016-11-09 DIAGNOSIS — D509 Iron deficiency anemia, unspecified: Secondary | ICD-10-CM | POA: Diagnosis not present

## 2016-11-09 DIAGNOSIS — N186 End stage renal disease: Secondary | ICD-10-CM | POA: Diagnosis not present

## 2016-11-11 DIAGNOSIS — D509 Iron deficiency anemia, unspecified: Secondary | ICD-10-CM | POA: Diagnosis not present

## 2016-11-11 DIAGNOSIS — D631 Anemia in chronic kidney disease: Secondary | ICD-10-CM | POA: Diagnosis not present

## 2016-11-11 DIAGNOSIS — E119 Type 2 diabetes mellitus without complications: Secondary | ICD-10-CM | POA: Diagnosis not present

## 2016-11-11 DIAGNOSIS — N186 End stage renal disease: Secondary | ICD-10-CM | POA: Diagnosis not present

## 2016-11-11 DIAGNOSIS — N2581 Secondary hyperparathyroidism of renal origin: Secondary | ICD-10-CM | POA: Diagnosis not present

## 2016-11-13 DIAGNOSIS — N186 End stage renal disease: Secondary | ICD-10-CM | POA: Diagnosis not present

## 2016-11-13 DIAGNOSIS — E119 Type 2 diabetes mellitus without complications: Secondary | ICD-10-CM | POA: Diagnosis not present

## 2016-11-13 DIAGNOSIS — D509 Iron deficiency anemia, unspecified: Secondary | ICD-10-CM | POA: Diagnosis not present

## 2016-11-13 DIAGNOSIS — D631 Anemia in chronic kidney disease: Secondary | ICD-10-CM | POA: Diagnosis not present

## 2016-11-13 DIAGNOSIS — N2581 Secondary hyperparathyroidism of renal origin: Secondary | ICD-10-CM | POA: Diagnosis not present

## 2016-11-15 DIAGNOSIS — E119 Type 2 diabetes mellitus without complications: Secondary | ICD-10-CM | POA: Diagnosis not present

## 2016-11-15 DIAGNOSIS — D631 Anemia in chronic kidney disease: Secondary | ICD-10-CM | POA: Diagnosis not present

## 2016-11-15 DIAGNOSIS — D509 Iron deficiency anemia, unspecified: Secondary | ICD-10-CM | POA: Diagnosis not present

## 2016-11-15 DIAGNOSIS — N186 End stage renal disease: Secondary | ICD-10-CM | POA: Diagnosis not present

## 2016-11-15 DIAGNOSIS — N2581 Secondary hyperparathyroidism of renal origin: Secondary | ICD-10-CM | POA: Diagnosis not present

## 2016-11-17 DIAGNOSIS — N186 End stage renal disease: Secondary | ICD-10-CM | POA: Diagnosis not present

## 2016-11-17 DIAGNOSIS — N2581 Secondary hyperparathyroidism of renal origin: Secondary | ICD-10-CM | POA: Diagnosis not present

## 2016-11-17 DIAGNOSIS — D631 Anemia in chronic kidney disease: Secondary | ICD-10-CM | POA: Diagnosis not present

## 2016-11-17 DIAGNOSIS — D509 Iron deficiency anemia, unspecified: Secondary | ICD-10-CM | POA: Diagnosis not present

## 2016-11-17 DIAGNOSIS — E119 Type 2 diabetes mellitus without complications: Secondary | ICD-10-CM | POA: Diagnosis not present

## 2016-11-20 DIAGNOSIS — N186 End stage renal disease: Secondary | ICD-10-CM | POA: Diagnosis not present

## 2016-11-20 DIAGNOSIS — D509 Iron deficiency anemia, unspecified: Secondary | ICD-10-CM | POA: Diagnosis not present

## 2016-11-20 DIAGNOSIS — D631 Anemia in chronic kidney disease: Secondary | ICD-10-CM | POA: Diagnosis not present

## 2016-11-20 DIAGNOSIS — N2581 Secondary hyperparathyroidism of renal origin: Secondary | ICD-10-CM | POA: Diagnosis not present

## 2016-11-20 DIAGNOSIS — E119 Type 2 diabetes mellitus without complications: Secondary | ICD-10-CM | POA: Diagnosis not present

## 2016-11-23 DIAGNOSIS — N186 End stage renal disease: Secondary | ICD-10-CM | POA: Diagnosis not present

## 2016-11-23 DIAGNOSIS — E119 Type 2 diabetes mellitus without complications: Secondary | ICD-10-CM | POA: Diagnosis not present

## 2016-11-23 DIAGNOSIS — D631 Anemia in chronic kidney disease: Secondary | ICD-10-CM | POA: Diagnosis not present

## 2016-11-23 DIAGNOSIS — N2581 Secondary hyperparathyroidism of renal origin: Secondary | ICD-10-CM | POA: Diagnosis not present

## 2016-11-23 DIAGNOSIS — D509 Iron deficiency anemia, unspecified: Secondary | ICD-10-CM | POA: Diagnosis not present

## 2016-11-25 DIAGNOSIS — D509 Iron deficiency anemia, unspecified: Secondary | ICD-10-CM | POA: Diagnosis not present

## 2016-11-25 DIAGNOSIS — N186 End stage renal disease: Secondary | ICD-10-CM | POA: Diagnosis not present

## 2016-11-25 DIAGNOSIS — E119 Type 2 diabetes mellitus without complications: Secondary | ICD-10-CM | POA: Diagnosis not present

## 2016-11-25 DIAGNOSIS — N2581 Secondary hyperparathyroidism of renal origin: Secondary | ICD-10-CM | POA: Diagnosis not present

## 2016-11-25 DIAGNOSIS — D631 Anemia in chronic kidney disease: Secondary | ICD-10-CM | POA: Diagnosis not present

## 2016-11-26 DIAGNOSIS — N186 End stage renal disease: Secondary | ICD-10-CM | POA: Diagnosis not present

## 2016-11-26 DIAGNOSIS — E1129 Type 2 diabetes mellitus with other diabetic kidney complication: Secondary | ICD-10-CM | POA: Diagnosis not present

## 2016-11-26 DIAGNOSIS — Z992 Dependence on renal dialysis: Secondary | ICD-10-CM | POA: Diagnosis not present

## 2016-11-27 DIAGNOSIS — D631 Anemia in chronic kidney disease: Secondary | ICD-10-CM | POA: Diagnosis not present

## 2016-11-27 DIAGNOSIS — N186 End stage renal disease: Secondary | ICD-10-CM | POA: Diagnosis not present

## 2016-11-27 DIAGNOSIS — D509 Iron deficiency anemia, unspecified: Secondary | ICD-10-CM | POA: Diagnosis not present

## 2016-11-27 DIAGNOSIS — E119 Type 2 diabetes mellitus without complications: Secondary | ICD-10-CM | POA: Diagnosis not present

## 2016-11-27 DIAGNOSIS — N2581 Secondary hyperparathyroidism of renal origin: Secondary | ICD-10-CM | POA: Diagnosis not present

## 2016-11-30 DIAGNOSIS — N186 End stage renal disease: Secondary | ICD-10-CM | POA: Diagnosis not present

## 2016-11-30 DIAGNOSIS — E119 Type 2 diabetes mellitus without complications: Secondary | ICD-10-CM | POA: Diagnosis not present

## 2016-11-30 DIAGNOSIS — N2581 Secondary hyperparathyroidism of renal origin: Secondary | ICD-10-CM | POA: Diagnosis not present

## 2016-11-30 DIAGNOSIS — D509 Iron deficiency anemia, unspecified: Secondary | ICD-10-CM | POA: Diagnosis not present

## 2016-11-30 DIAGNOSIS — D631 Anemia in chronic kidney disease: Secondary | ICD-10-CM | POA: Diagnosis not present

## 2016-12-02 DIAGNOSIS — N2581 Secondary hyperparathyroidism of renal origin: Secondary | ICD-10-CM | POA: Diagnosis not present

## 2016-12-02 DIAGNOSIS — N186 End stage renal disease: Secondary | ICD-10-CM | POA: Diagnosis not present

## 2016-12-02 DIAGNOSIS — D631 Anemia in chronic kidney disease: Secondary | ICD-10-CM | POA: Diagnosis not present

## 2016-12-02 DIAGNOSIS — D509 Iron deficiency anemia, unspecified: Secondary | ICD-10-CM | POA: Diagnosis not present

## 2016-12-02 DIAGNOSIS — E119 Type 2 diabetes mellitus without complications: Secondary | ICD-10-CM | POA: Diagnosis not present

## 2016-12-04 DIAGNOSIS — N186 End stage renal disease: Secondary | ICD-10-CM | POA: Diagnosis not present

## 2016-12-04 DIAGNOSIS — D631 Anemia in chronic kidney disease: Secondary | ICD-10-CM | POA: Diagnosis not present

## 2016-12-04 DIAGNOSIS — D509 Iron deficiency anemia, unspecified: Secondary | ICD-10-CM | POA: Diagnosis not present

## 2016-12-04 DIAGNOSIS — E119 Type 2 diabetes mellitus without complications: Secondary | ICD-10-CM | POA: Diagnosis not present

## 2016-12-04 DIAGNOSIS — N2581 Secondary hyperparathyroidism of renal origin: Secondary | ICD-10-CM | POA: Diagnosis not present

## 2016-12-07 DIAGNOSIS — D509 Iron deficiency anemia, unspecified: Secondary | ICD-10-CM | POA: Diagnosis not present

## 2016-12-07 DIAGNOSIS — N186 End stage renal disease: Secondary | ICD-10-CM | POA: Diagnosis not present

## 2016-12-07 DIAGNOSIS — N2581 Secondary hyperparathyroidism of renal origin: Secondary | ICD-10-CM | POA: Diagnosis not present

## 2016-12-07 DIAGNOSIS — D631 Anemia in chronic kidney disease: Secondary | ICD-10-CM | POA: Diagnosis not present

## 2016-12-07 DIAGNOSIS — E119 Type 2 diabetes mellitus without complications: Secondary | ICD-10-CM | POA: Diagnosis not present

## 2016-12-09 DIAGNOSIS — N2581 Secondary hyperparathyroidism of renal origin: Secondary | ICD-10-CM | POA: Diagnosis not present

## 2016-12-09 DIAGNOSIS — D631 Anemia in chronic kidney disease: Secondary | ICD-10-CM | POA: Diagnosis not present

## 2016-12-09 DIAGNOSIS — D509 Iron deficiency anemia, unspecified: Secondary | ICD-10-CM | POA: Diagnosis not present

## 2016-12-09 DIAGNOSIS — N186 End stage renal disease: Secondary | ICD-10-CM | POA: Diagnosis not present

## 2016-12-09 DIAGNOSIS — E119 Type 2 diabetes mellitus without complications: Secondary | ICD-10-CM | POA: Diagnosis not present

## 2016-12-11 DIAGNOSIS — D509 Iron deficiency anemia, unspecified: Secondary | ICD-10-CM | POA: Diagnosis not present

## 2016-12-11 DIAGNOSIS — N186 End stage renal disease: Secondary | ICD-10-CM | POA: Diagnosis not present

## 2016-12-11 DIAGNOSIS — N2581 Secondary hyperparathyroidism of renal origin: Secondary | ICD-10-CM | POA: Diagnosis not present

## 2016-12-11 DIAGNOSIS — D631 Anemia in chronic kidney disease: Secondary | ICD-10-CM | POA: Diagnosis not present

## 2016-12-11 DIAGNOSIS — E119 Type 2 diabetes mellitus without complications: Secondary | ICD-10-CM | POA: Diagnosis not present

## 2016-12-14 DIAGNOSIS — N2581 Secondary hyperparathyroidism of renal origin: Secondary | ICD-10-CM | POA: Diagnosis not present

## 2016-12-14 DIAGNOSIS — E119 Type 2 diabetes mellitus without complications: Secondary | ICD-10-CM | POA: Diagnosis not present

## 2016-12-14 DIAGNOSIS — N186 End stage renal disease: Secondary | ICD-10-CM | POA: Diagnosis not present

## 2016-12-14 DIAGNOSIS — D631 Anemia in chronic kidney disease: Secondary | ICD-10-CM | POA: Diagnosis not present

## 2016-12-14 DIAGNOSIS — D509 Iron deficiency anemia, unspecified: Secondary | ICD-10-CM | POA: Diagnosis not present

## 2016-12-16 DIAGNOSIS — D509 Iron deficiency anemia, unspecified: Secondary | ICD-10-CM | POA: Diagnosis not present

## 2016-12-16 DIAGNOSIS — E119 Type 2 diabetes mellitus without complications: Secondary | ICD-10-CM | POA: Diagnosis not present

## 2016-12-16 DIAGNOSIS — N186 End stage renal disease: Secondary | ICD-10-CM | POA: Diagnosis not present

## 2016-12-16 DIAGNOSIS — N2581 Secondary hyperparathyroidism of renal origin: Secondary | ICD-10-CM | POA: Diagnosis not present

## 2016-12-16 DIAGNOSIS — D631 Anemia in chronic kidney disease: Secondary | ICD-10-CM | POA: Diagnosis not present

## 2016-12-18 DIAGNOSIS — N186 End stage renal disease: Secondary | ICD-10-CM | POA: Diagnosis not present

## 2016-12-18 DIAGNOSIS — N2581 Secondary hyperparathyroidism of renal origin: Secondary | ICD-10-CM | POA: Diagnosis not present

## 2016-12-18 DIAGNOSIS — D509 Iron deficiency anemia, unspecified: Secondary | ICD-10-CM | POA: Diagnosis not present

## 2016-12-18 DIAGNOSIS — E119 Type 2 diabetes mellitus without complications: Secondary | ICD-10-CM | POA: Diagnosis not present

## 2016-12-18 DIAGNOSIS — D631 Anemia in chronic kidney disease: Secondary | ICD-10-CM | POA: Diagnosis not present

## 2016-12-20 DIAGNOSIS — D631 Anemia in chronic kidney disease: Secondary | ICD-10-CM | POA: Diagnosis not present

## 2016-12-20 DIAGNOSIS — N186 End stage renal disease: Secondary | ICD-10-CM | POA: Diagnosis not present

## 2016-12-20 DIAGNOSIS — D509 Iron deficiency anemia, unspecified: Secondary | ICD-10-CM | POA: Diagnosis not present

## 2016-12-20 DIAGNOSIS — E119 Type 2 diabetes mellitus without complications: Secondary | ICD-10-CM | POA: Diagnosis not present

## 2016-12-20 DIAGNOSIS — N2581 Secondary hyperparathyroidism of renal origin: Secondary | ICD-10-CM | POA: Diagnosis not present

## 2016-12-23 DIAGNOSIS — E119 Type 2 diabetes mellitus without complications: Secondary | ICD-10-CM | POA: Diagnosis not present

## 2016-12-23 DIAGNOSIS — N186 End stage renal disease: Secondary | ICD-10-CM | POA: Diagnosis not present

## 2016-12-23 DIAGNOSIS — N2581 Secondary hyperparathyroidism of renal origin: Secondary | ICD-10-CM | POA: Diagnosis not present

## 2016-12-23 DIAGNOSIS — D509 Iron deficiency anemia, unspecified: Secondary | ICD-10-CM | POA: Diagnosis not present

## 2016-12-23 DIAGNOSIS — D631 Anemia in chronic kidney disease: Secondary | ICD-10-CM | POA: Diagnosis not present

## 2016-12-25 DIAGNOSIS — E119 Type 2 diabetes mellitus without complications: Secondary | ICD-10-CM | POA: Diagnosis not present

## 2016-12-25 DIAGNOSIS — N2581 Secondary hyperparathyroidism of renal origin: Secondary | ICD-10-CM | POA: Diagnosis not present

## 2016-12-25 DIAGNOSIS — D509 Iron deficiency anemia, unspecified: Secondary | ICD-10-CM | POA: Diagnosis not present

## 2016-12-25 DIAGNOSIS — N186 End stage renal disease: Secondary | ICD-10-CM | POA: Diagnosis not present

## 2016-12-25 DIAGNOSIS — D631 Anemia in chronic kidney disease: Secondary | ICD-10-CM | POA: Diagnosis not present

## 2016-12-27 DIAGNOSIS — E1129 Type 2 diabetes mellitus with other diabetic kidney complication: Secondary | ICD-10-CM | POA: Diagnosis not present

## 2016-12-27 DIAGNOSIS — D631 Anemia in chronic kidney disease: Secondary | ICD-10-CM | POA: Diagnosis not present

## 2016-12-27 DIAGNOSIS — Z992 Dependence on renal dialysis: Secondary | ICD-10-CM | POA: Diagnosis not present

## 2016-12-27 DIAGNOSIS — N2581 Secondary hyperparathyroidism of renal origin: Secondary | ICD-10-CM | POA: Diagnosis not present

## 2016-12-27 DIAGNOSIS — D509 Iron deficiency anemia, unspecified: Secondary | ICD-10-CM | POA: Diagnosis not present

## 2016-12-27 DIAGNOSIS — E119 Type 2 diabetes mellitus without complications: Secondary | ICD-10-CM | POA: Diagnosis not present

## 2016-12-27 DIAGNOSIS — N186 End stage renal disease: Secondary | ICD-10-CM | POA: Diagnosis not present

## 2016-12-30 DIAGNOSIS — D509 Iron deficiency anemia, unspecified: Secondary | ICD-10-CM | POA: Diagnosis not present

## 2016-12-30 DIAGNOSIS — D631 Anemia in chronic kidney disease: Secondary | ICD-10-CM | POA: Diagnosis not present

## 2016-12-30 DIAGNOSIS — E119 Type 2 diabetes mellitus without complications: Secondary | ICD-10-CM | POA: Diagnosis not present

## 2016-12-30 DIAGNOSIS — N2581 Secondary hyperparathyroidism of renal origin: Secondary | ICD-10-CM | POA: Diagnosis not present

## 2016-12-30 DIAGNOSIS — N186 End stage renal disease: Secondary | ICD-10-CM | POA: Diagnosis not present

## 2017-01-01 DIAGNOSIS — N186 End stage renal disease: Secondary | ICD-10-CM | POA: Diagnosis not present

## 2017-01-01 DIAGNOSIS — D509 Iron deficiency anemia, unspecified: Secondary | ICD-10-CM | POA: Diagnosis not present

## 2017-01-01 DIAGNOSIS — E119 Type 2 diabetes mellitus without complications: Secondary | ICD-10-CM | POA: Diagnosis not present

## 2017-01-01 DIAGNOSIS — D631 Anemia in chronic kidney disease: Secondary | ICD-10-CM | POA: Diagnosis not present

## 2017-01-01 DIAGNOSIS — N2581 Secondary hyperparathyroidism of renal origin: Secondary | ICD-10-CM | POA: Diagnosis not present

## 2017-01-04 DIAGNOSIS — N2581 Secondary hyperparathyroidism of renal origin: Secondary | ICD-10-CM | POA: Diagnosis not present

## 2017-01-04 DIAGNOSIS — D509 Iron deficiency anemia, unspecified: Secondary | ICD-10-CM | POA: Diagnosis not present

## 2017-01-04 DIAGNOSIS — D631 Anemia in chronic kidney disease: Secondary | ICD-10-CM | POA: Diagnosis not present

## 2017-01-04 DIAGNOSIS — E119 Type 2 diabetes mellitus without complications: Secondary | ICD-10-CM | POA: Diagnosis not present

## 2017-01-04 DIAGNOSIS — N186 End stage renal disease: Secondary | ICD-10-CM | POA: Diagnosis not present

## 2017-01-06 DIAGNOSIS — D509 Iron deficiency anemia, unspecified: Secondary | ICD-10-CM | POA: Diagnosis not present

## 2017-01-06 DIAGNOSIS — D631 Anemia in chronic kidney disease: Secondary | ICD-10-CM | POA: Diagnosis not present

## 2017-01-06 DIAGNOSIS — E119 Type 2 diabetes mellitus without complications: Secondary | ICD-10-CM | POA: Diagnosis not present

## 2017-01-06 DIAGNOSIS — N2581 Secondary hyperparathyroidism of renal origin: Secondary | ICD-10-CM | POA: Diagnosis not present

## 2017-01-06 DIAGNOSIS — N186 End stage renal disease: Secondary | ICD-10-CM | POA: Diagnosis not present

## 2017-01-08 DIAGNOSIS — D631 Anemia in chronic kidney disease: Secondary | ICD-10-CM | POA: Diagnosis not present

## 2017-01-08 DIAGNOSIS — N2581 Secondary hyperparathyroidism of renal origin: Secondary | ICD-10-CM | POA: Diagnosis not present

## 2017-01-08 DIAGNOSIS — E119 Type 2 diabetes mellitus without complications: Secondary | ICD-10-CM | POA: Diagnosis not present

## 2017-01-08 DIAGNOSIS — N186 End stage renal disease: Secondary | ICD-10-CM | POA: Diagnosis not present

## 2017-01-08 DIAGNOSIS — D509 Iron deficiency anemia, unspecified: Secondary | ICD-10-CM | POA: Diagnosis not present

## 2017-01-11 ENCOUNTER — Telehealth: Payer: Self-pay | Admitting: Cardiology

## 2017-01-11 DIAGNOSIS — D631 Anemia in chronic kidney disease: Secondary | ICD-10-CM | POA: Diagnosis not present

## 2017-01-11 DIAGNOSIS — D509 Iron deficiency anemia, unspecified: Secondary | ICD-10-CM | POA: Diagnosis not present

## 2017-01-11 DIAGNOSIS — N186 End stage renal disease: Secondary | ICD-10-CM | POA: Diagnosis not present

## 2017-01-11 DIAGNOSIS — N2581 Secondary hyperparathyroidism of renal origin: Secondary | ICD-10-CM | POA: Diagnosis not present

## 2017-01-11 DIAGNOSIS — E119 Type 2 diabetes mellitus without complications: Secondary | ICD-10-CM | POA: Diagnosis not present

## 2017-01-11 NOTE — Telephone Encounter (Signed)
Received records from [facility] for appointment on 01/12/17 with Dr Ellyn Hack.  Records put with Dr Allison Quarry schedule for 01/12/17. lp

## 2017-01-12 ENCOUNTER — Ambulatory Visit (INDEPENDENT_AMBULATORY_CARE_PROVIDER_SITE_OTHER): Payer: Medicare Other | Admitting: Cardiology

## 2017-01-12 ENCOUNTER — Encounter: Payer: Self-pay | Admitting: Cardiology

## 2017-01-12 VITALS — BP 192/82 | HR 95 | Ht 66.0 in | Wt 169.4 lb

## 2017-01-12 DIAGNOSIS — I5032 Chronic diastolic (congestive) heart failure: Secondary | ICD-10-CM

## 2017-01-12 DIAGNOSIS — N186 End stage renal disease: Secondary | ICD-10-CM | POA: Diagnosis not present

## 2017-01-12 DIAGNOSIS — R0609 Other forms of dyspnea: Secondary | ICD-10-CM | POA: Diagnosis not present

## 2017-01-12 DIAGNOSIS — I119 Hypertensive heart disease without heart failure: Secondary | ICD-10-CM

## 2017-01-12 DIAGNOSIS — I480 Paroxysmal atrial fibrillation: Secondary | ICD-10-CM | POA: Diagnosis not present

## 2017-01-12 MED ORDER — AMLODIPINE BESYLATE 5 MG PO TABS: 5.0000 mg | ORAL_TABLET | Freq: Every day | ORAL | 6 refills | Status: DC

## 2017-01-12 MED ORDER — METOPROLOL SUCCINATE ER 50 MG PO TB24: 50.0000 mg | ORAL_TABLET | Freq: Every day | ORAL | 3 refills | Status: DC

## 2017-01-12 NOTE — Patient Instructions (Addendum)
Schedule [address] Your physician has requested that you have an echocardiogram. Echocardiography is a painless test that uses sound waves to create images of your heart. It provides your doctor with information about the size and shape of your heart and how well your heart's chambers and valves are working. This procedure takes approximately one hour. There are no restrictions for this procedure.   PLEASE HAVE DIALYSIS RECORD BLOOD PRESSURE BEFORE ,DURING , AFTER  Dialysis   medications    Increase 50 mg  Metoprolol xl  Twice a day Amlodipine 5 mg one tablet daily    Your physician recommends that you schedule a follow-up appointment FEB 5,2018 WITH DR HARDING.    If you need a refill on your cardiac medications before your next appointment, please call your pharmacy.

## 2017-01-12 NOTE — Progress Notes (Signed)
PCP: Elayne Snare, MD  Nephrology: Dr. Mercy Moore Cardiologist: Dr. Acie Fredrickson  Clinic Note: Chief Complaint  Patient presents with  . New Patient (Initial Visit)  . Shortness of Breath    occassionally.  . Dizziness    occassionally.    HPI: [name] is a 76 y.o. male with a PMH below who presents today for Evaluation for pre-hemodialysis shortness of breath that continued during dialysis. He also had roughly 2 minutes of chest pain prior to starting hemodialysis.  [name] is a patient of Dr. Acie Fredrickson, who was last seen in February 2016. He has a history of paroxysmal atrial fibrillation no longer on warfarin due to history of GI bleed. He has chronic diastolic heart failure with grade 1 diastolic dysfunction associated with hypertensive heart disease. He has end-stage renal disease on dialysis. He also has diabetes mellitus, type II but is not on medications. He also has mild early onset dementia. - He is maintained on amiodarone for his A. fib rate/rhythm control along with metoprolol. - The patient and his daughter declined reevaluation with echocardiogram at that time. The recommendation was to be seen on as-needed basis.  Recent Hospitalizations: None recently  Studies Reviewed:   Myoview from April 10, 2011: NORMAL exam. EF 66%.  Transthoracic echocardiogram October 2009: Normal LV size and function. EF 60-65% with no regional wall motion modalities. Diastolic dysfunction noted (likely grade 1). Moderately increased pulmonary pressures.  Interval History:  Fortunately [name] presents today for his clinic visit without family member present. He really does not remember why he was sent here, and I don't have any note indicating the reason. He is on my schedule as the DOD today.  From what I can gather from him, he has been on hemodialysis are about 5 years now, and noted this Monday morning he had more than usual dyspnea. He had a little bit of orthopnea this evening prior to  dialysis and actually the morning of he did note about 2 minutes of chest heaviness and pressure that he had not had before. He doesn't say that this happens every weekend, but it is more likely to happen on a Monday morning and the other day. He said he felt better within 20 minutes of starting hemodialysis. He also gave him a little oxygen and that helped. Once he completed dialysis he felt fine without any further dyspnea or chest tightness. Other than that episode he is not had any resting or exertional chest tightness or pressure. Usually does not have PND, orthopnea or edema, but if he will have some it would be on the Monday morning delay for dialysis.  No palpitations, lightheadedness, dizziness, weakness or syncope/near syncope. No TIA/amaurosis fugax symptoms. No melena, hematochezia, or epstaxis. No claudication.  He does not routinely see a PCP, and says that his nephrologist is Dr. Mercy Moore, but has not had blood pressure medications adjusted.  ROS: A comprehensive was performed. Very difficult to perform as he is not a very good historian Review of Systems  Constitutional: Positive for malaise/fatigue (Pretty much at his baseline).  HENT: Positive for hearing loss. Negative for congestion and nosebleeds.   Eyes: Negative for blurred vision.  Respiratory: Positive for shortness of breath (Per history of present illness).   Cardiovascular:       Per history of present illness  Gastrointestinal: Negative for blood in stool and melena.  Musculoskeletal: Positive for joint pain. Negative for falls and myalgias.  Neurological: Positive for dizziness (Sometimes after dialysis). Negative for focal  weakness, seizures and headaches.  Psychiatric/Behavioral: Positive for memory loss. Negative for depression. The patient is nervous/anxious.   All other systems reviewed and are negative.   Past Medical History:  Diagnosis Date  . A-fib ([place])   . Anemia   . Blood transfusion   . BPH  (benign prostatic hyperplasia)   . CHF (congestive heart failure) ([place])   . Chronic kidney disease   . CKD (chronic kidney disease)   . Diarrhea   . DM (diabetes mellitus) ([place])   . History of GI bleed    secondary to coumadin  . HTN (hypertension)   . Hyperlipidemia   . OSA (obstructive sleep apnea)    uses CPAP  . Secondary hyperparathyroidism of renal origin [facility])     Past Surgical History:  Procedure Laterality Date  . BVT  4/70/96   Left  Basilic Vein Transposition  . CHOLECYSTECTOMY    . EYE SURGERY     Catarct bil  . INSERTION OF DIALYSIS CATHETER  05/28/2012   Procedure: INSERTION OF DIALYSIS CATHETER;  Surgeon: Mal Misty, MD;  Location: [place];  Service: Vascular;  Laterality: Right;  . Left arm shuntogram.    . Left forearm loop graft with 6 mm Gore-Tex graft.    . Pars plana vitrectomy with 25-gauge system      Current Meds  Medication Sig  . acetaminophen (TYLENOL) 325 MG tablet Take 650 mg by mouth every 4 (four) hours as needed. Headache or pain  . aspirin EC 325 MG tablet Take 1 tablet (325 mg total) by mouth daily.  Marland Kitchen atorvastatin (LIPITOR) 40 MG tablet take 1 tablet by mouth once daily  . B Complex-C-Folic Acid (NEPHRO-VITE RX PO) Take by mouth daily.    . calcium acetate (PHOSLO) 667 MG capsule Take 3 tablets by mouth 3 (three) times daily before meals.   . metoprolol succinate (TOPROL-XL) 50 MG 24 hr tablet Take 1 tablet (50 mg total) by mouth daily. Take one tablet twice a day  . OVER THE COUNTER MEDICATION Take 1 tablet by mouth 2 (two) times daily as needed. Magnesium Salicylate Tetrahydrate (580 mg) [employer] brand back pain relief  . pregabalin (LYRICA) 50 MG capsule Take 50 mg by mouth at bedtime.  . repaglinide (PRANDIN) 0.5 MG tablet Take 0.5 mg by mouth 3 (three) times daily before meals.  Marland Kitchen zolpidem (AMBIEN) 5 MG tablet Take 5 mg by mouth at bedtime.  . [DISCONTINUED] metoprolol succinate (TOPROL-XL) 25 MG 24 hr tablet Take 25 mg by mouth daily.  Take one tablet twice a day    Allergies  Allergen Reactions  . Tape Itching    Cloth tape only    Social History   Social History  . Marital status: Divorced    Spouse name: N/A  . Number of children: N/A  . Years of education: N/A   Social History Main Topics  . Smoking status: Former Smoker    Types: Cigarettes    Quit date: 06/30/2004  . Smokeless tobacco: Never Used  . Alcohol use No  . Drug use: No  . Sexual activity: No   Other Topics Concern  . None   Social History Narrative     The patient is a former smoker, quit is 2005.  Does not     drink or abuse drugs.  Currently, lives alone at home.          family history includes Alzheimer's disease in his mother; Cancer in his daughter;  Diabetes in his father and son; Heart disease in his son; Hypertension in his son.  Wt Readings from Last 3 Encounters:  01/12/17 76.8 kg (169 lb 6.4 oz)  02/14/15 79.7 kg (175 lb 12.8 oz)  10/27/12 82 kg (180 lb 12.4 oz)    PHYSICAL EXAM BP (!) 192/82   Pulse 95   Ht 5\' 6"  (1.676 m)   Wt 76.8 kg (169 lb 6.4 oz)   BMI 27.34 kg/m  General appearance: alert, cooperative, appears stated age, no distress and Well-nourished. Mildly disheveled Neck: no adenopathy, no carotid bruit and no JVD Lungs: clear to auscultation bilaterally, normal percussion bilaterally and non-labored Heart: Irregularly irregular, S1, S2 normal, no murmur, click, rub or gallop Abdomen: soft, non-tender; bowel sounds normal; no masses,  no organomegaly;  Extremities: extremities normal, atraumatic, no cyanosis, and edema - trivial Pulses: 2+ and symmetric;  Skin: Usual dialysis related thick, scaly skin with no obvious calciphylaxis. or  Neurologic: Mental status: Alert, oriented, thought content appropriate -- is alert and oriented to person, place and time, but poor historian. Cranial nerves: normal (II-XII grossly intact)    Adult ECG Report  Rate: 95 ;  Rhythm: atrial fibrillation;  low voltage.  Cannot exclude septal MI, age undetermined.  Narrative Interpretation: No recent previous EKG - most recent EKG from February 2016 showed sinus rhythm with possible anterior MI.   Other studies Reviewed: Additional studies/ records that were reviewed today include:  Recent Labs: N/a  ASSESSMENT / PLAN: Problem List Items Addressed This Visit    Chronic diastolic heart failure ([place]) (Chronic)    He has not had a recent evaluation of his EF. I'm concerned that he may start having signs of burned out hypertensive cardiomyopathy and now worsening EF. Plan: He is agreeable to check an echocardiogram.      Relevant Medications   metoprolol succinate (TOPROL-XL) 50 MG 24 hr tablet   amLODipine (NORVASC) 5 MG tablet   Hypertension, accelerated with heart disease, without CHF (Chronic)    He has chronic diastolic heart dysfunction with diastolic heart failure symptoms currently. He has very poorly controlled blood pressure, and I suspect his blood pressure was even higher on Monday morning prior to dialysis. Currently 192/82 mmHg.  At this point I think he just is aggressive blood pressure control and volume removal by dialysis. Plan: I will increase his Toprol to 50 mg twice a day, I will also add amlodipine 5 mg daily.      Relevant Medications   metoprolol succinate (TOPROL-XL) 50 MG 24 hr tablet   amLODipine (NORVASC) 5 MG tablet   Other Relevant Orders   EKG 12-Lead (Completed)   ECHOCARDIOGRAM COMPLETE   PAF (paroxysmal atrial fibrillation) ([facility]) (Chronic)    Has paroxysmal A. fib. Currently on amiodarone, but back in A. fib.  With great borderline control, and increase his metoprolol. Given history of GI bleed, we have not integrated with him. This is something that I would need to discuss with his daughter present as he is not a very good historian. Currently on aspirin 325 mg.      Relevant Medications   metoprolol succinate (TOPROL-XL) 50 MG 24 hr tablet   amLODipine  (NORVASC) 5 MG tablet   Other Relevant Orders   EKG 12-Lead (Completed)   ECHOCARDIOGRAM COMPLETE   End stage renal disease ([place])   Relevant Orders   EKG 12-Lead (Completed)   ECHOCARDIOGRAM COMPLETE   Dyspnea on exertion - Primary    Related to  hypertensive heart disease. See above.      Relevant Orders   EKG 12-Lead (Completed)   ECHOCARDIOGRAM COMPLETE      Current medicines are reviewed at length with the patient today. (+/- concerns) N/A The following changes have been made: See below  Patient Instructions  Schedule [facility] has requested that you have an echocardiogram. Echocardiography is a painless test that uses sound waves to create images of your heart. It provides your doctor with information about the size and shape of your heart and how well your heart's chambers and valves are working. This procedure takes approximately one hour. There are no restrictions for this procedure.   PLEASE HAVE DIALYSIS RECORD BLOOD PRESSURE BEFORE ,DURING , AFTER  Dialysis   medications    Increase 50 mg  Metoprolol xl  Twice a day Amlodipine 5 mg one tablet daily    Your physician recommends that you schedule a follow-up appointment FEB 5,2018 WITH DR Kristal Perl.    If you need a refill on your cardiac medications before your next appointment, please call your pharmacy.   After follow-up with me, I would return to the care of Dr. Acie Fredrickson.   Studies Ordered:   Orders Placed This Encounter  Procedures  . EKG 12-Lead  . ECHOCARDIOGRAM COMPLETE      Glenetta Hew, M.D., M.S. Interventional Cardiologist   Pager # [phone] Phone # [phone] [address] [place], [place] [address]

## 2017-01-13 DIAGNOSIS — D631 Anemia in chronic kidney disease: Secondary | ICD-10-CM | POA: Diagnosis not present

## 2017-01-13 DIAGNOSIS — E119 Type 2 diabetes mellitus without complications: Secondary | ICD-10-CM | POA: Diagnosis not present

## 2017-01-13 DIAGNOSIS — N2581 Secondary hyperparathyroidism of renal origin: Secondary | ICD-10-CM | POA: Diagnosis not present

## 2017-01-13 DIAGNOSIS — D509 Iron deficiency anemia, unspecified: Secondary | ICD-10-CM | POA: Diagnosis not present

## 2017-01-13 DIAGNOSIS — N186 End stage renal disease: Secondary | ICD-10-CM | POA: Diagnosis not present

## 2017-01-15 DIAGNOSIS — D631 Anemia in chronic kidney disease: Secondary | ICD-10-CM | POA: Diagnosis not present

## 2017-01-15 DIAGNOSIS — N186 End stage renal disease: Secondary | ICD-10-CM | POA: Diagnosis not present

## 2017-01-15 DIAGNOSIS — N2581 Secondary hyperparathyroidism of renal origin: Secondary | ICD-10-CM | POA: Diagnosis not present

## 2017-01-15 DIAGNOSIS — E119 Type 2 diabetes mellitus without complications: Secondary | ICD-10-CM | POA: Diagnosis not present

## 2017-01-15 DIAGNOSIS — D509 Iron deficiency anemia, unspecified: Secondary | ICD-10-CM | POA: Diagnosis not present

## 2017-01-18 ENCOUNTER — Encounter: Payer: Self-pay | Admitting: Cardiology

## 2017-01-18 DIAGNOSIS — D509 Iron deficiency anemia, unspecified: Secondary | ICD-10-CM | POA: Diagnosis not present

## 2017-01-18 DIAGNOSIS — N2581 Secondary hyperparathyroidism of renal origin: Secondary | ICD-10-CM | POA: Diagnosis not present

## 2017-01-18 DIAGNOSIS — D631 Anemia in chronic kidney disease: Secondary | ICD-10-CM | POA: Diagnosis not present

## 2017-01-18 DIAGNOSIS — N186 End stage renal disease: Secondary | ICD-10-CM | POA: Diagnosis not present

## 2017-01-18 DIAGNOSIS — E119 Type 2 diabetes mellitus without complications: Secondary | ICD-10-CM | POA: Diagnosis not present

## 2017-01-18 NOTE — Assessment & Plan Note (Signed)
Has paroxysmal A. fib. Currently on amiodarone, but back in A. fib.  With great borderline control, and increase his metoprolol. Given history of GI bleed, we have not integrated with him. This is something that I would need to discuss with his daughter present as he is not a very good historian. Currently on aspirin 325 mg.

## 2017-01-18 NOTE — Assessment & Plan Note (Signed)
He has not had a recent evaluation of his EF. I'm concerned that he may start having signs of burned out hypertensive cardiomyopathy and now worsening EF. Plan: He is agreeable to check an echocardiogram.

## 2017-01-18 NOTE — Assessment & Plan Note (Signed)
He has chronic diastolic heart dysfunction with diastolic heart failure symptoms currently. He has very poorly controlled blood pressure, and I suspect his blood pressure was even higher on Monday morning prior to dialysis. Currently 192/82 mmHg.  At this point I think he just is aggressive blood pressure control and volume removal by dialysis. Plan: I will increase his Toprol to 50 mg twice a day, I will also add amlodipine 5 mg daily.

## 2017-01-18 NOTE — Assessment & Plan Note (Signed)
Related to hypertensive heart disease. See above.

## 2017-01-20 DIAGNOSIS — D631 Anemia in chronic kidney disease: Secondary | ICD-10-CM | POA: Diagnosis not present

## 2017-01-20 DIAGNOSIS — D509 Iron deficiency anemia, unspecified: Secondary | ICD-10-CM | POA: Diagnosis not present

## 2017-01-20 DIAGNOSIS — N186 End stage renal disease: Secondary | ICD-10-CM | POA: Diagnosis not present

## 2017-01-20 DIAGNOSIS — N2581 Secondary hyperparathyroidism of renal origin: Secondary | ICD-10-CM | POA: Diagnosis not present

## 2017-01-20 DIAGNOSIS — E119 Type 2 diabetes mellitus without complications: Secondary | ICD-10-CM | POA: Diagnosis not present

## 2017-01-22 DIAGNOSIS — D631 Anemia in chronic kidney disease: Secondary | ICD-10-CM | POA: Diagnosis not present

## 2017-01-22 DIAGNOSIS — D509 Iron deficiency anemia, unspecified: Secondary | ICD-10-CM | POA: Diagnosis not present

## 2017-01-22 DIAGNOSIS — N2581 Secondary hyperparathyroidism of renal origin: Secondary | ICD-10-CM | POA: Diagnosis not present

## 2017-01-22 DIAGNOSIS — E119 Type 2 diabetes mellitus without complications: Secondary | ICD-10-CM | POA: Diagnosis not present

## 2017-01-22 DIAGNOSIS — N186 End stage renal disease: Secondary | ICD-10-CM | POA: Diagnosis not present

## 2017-01-25 ENCOUNTER — Telehealth: Payer: Self-pay | Admitting: Cardiology

## 2017-01-25 DIAGNOSIS — D631 Anemia in chronic kidney disease: Secondary | ICD-10-CM | POA: Diagnosis not present

## 2017-01-25 DIAGNOSIS — D509 Iron deficiency anemia, unspecified: Secondary | ICD-10-CM | POA: Diagnosis not present

## 2017-01-25 DIAGNOSIS — N186 End stage renal disease: Secondary | ICD-10-CM | POA: Diagnosis not present

## 2017-01-25 DIAGNOSIS — E119 Type 2 diabetes mellitus without complications: Secondary | ICD-10-CM | POA: Diagnosis not present

## 2017-01-25 DIAGNOSIS — N2581 Secondary hyperparathyroidism of renal origin: Secondary | ICD-10-CM | POA: Diagnosis not present

## 2017-01-25 MED ORDER — METOPROLOL SUCCINATE ER 50 MG PO TB24: 50.0000 mg | ORAL_TABLET | Freq: Two times a day (BID) | ORAL | 6 refills | Status: DC

## 2017-01-25 NOTE — Telephone Encounter (Signed)
[name] with [facility] calling regarding a problem with a medication-sending to [employer]

## 2017-01-25 NOTE — Telephone Encounter (Signed)
Patient should be taking metoprolol succinate 50 mg twice a day. Left message on pharmacy voice mail

## 2017-01-25 NOTE — Telephone Encounter (Signed)
Spoke to pharmacist  Prescription was transferred to pharmacy to fill\ metoprolol succinate 50 mg twice a day

## 2017-01-25 NOTE — Telephone Encounter (Signed)
Baxter Flattery from [employer], is calling and would like to verify the instructions for the medication Metoprolol. Please call and confirm if it is once a day or twice a day. Thanks.

## 2017-01-27 DIAGNOSIS — E1129 Type 2 diabetes mellitus with other diabetic kidney complication: Secondary | ICD-10-CM | POA: Diagnosis not present

## 2017-01-27 DIAGNOSIS — Z992 Dependence on renal dialysis: Secondary | ICD-10-CM | POA: Diagnosis not present

## 2017-01-27 DIAGNOSIS — N2581 Secondary hyperparathyroidism of renal origin: Secondary | ICD-10-CM | POA: Diagnosis not present

## 2017-01-27 DIAGNOSIS — D631 Anemia in chronic kidney disease: Secondary | ICD-10-CM | POA: Diagnosis not present

## 2017-01-27 DIAGNOSIS — D509 Iron deficiency anemia, unspecified: Secondary | ICD-10-CM | POA: Diagnosis not present

## 2017-01-27 DIAGNOSIS — E119 Type 2 diabetes mellitus without complications: Secondary | ICD-10-CM | POA: Diagnosis not present

## 2017-01-27 DIAGNOSIS — N186 End stage renal disease: Secondary | ICD-10-CM | POA: Diagnosis not present

## 2017-01-29 DIAGNOSIS — N186 End stage renal disease: Secondary | ICD-10-CM | POA: Diagnosis not present

## 2017-01-29 DIAGNOSIS — D631 Anemia in chronic kidney disease: Secondary | ICD-10-CM | POA: Diagnosis not present

## 2017-01-29 DIAGNOSIS — E119 Type 2 diabetes mellitus without complications: Secondary | ICD-10-CM | POA: Diagnosis not present

## 2017-01-29 DIAGNOSIS — N2581 Secondary hyperparathyroidism of renal origin: Secondary | ICD-10-CM | POA: Diagnosis not present

## 2017-02-01 ENCOUNTER — Ambulatory Visit: Payer: Medicare Other | Admitting: Cardiology

## 2017-02-01 DIAGNOSIS — N2581 Secondary hyperparathyroidism of renal origin: Secondary | ICD-10-CM | POA: Diagnosis not present

## 2017-02-01 DIAGNOSIS — N186 End stage renal disease: Secondary | ICD-10-CM | POA: Diagnosis not present

## 2017-02-01 DIAGNOSIS — E119 Type 2 diabetes mellitus without complications: Secondary | ICD-10-CM | POA: Diagnosis not present

## 2017-02-01 DIAGNOSIS — D631 Anemia in chronic kidney disease: Secondary | ICD-10-CM | POA: Diagnosis not present

## 2017-02-02 ENCOUNTER — Other Ambulatory Visit (HOSPITAL_COMMUNITY): Payer: Medicare Other

## 2017-02-03 DIAGNOSIS — D631 Anemia in chronic kidney disease: Secondary | ICD-10-CM | POA: Diagnosis not present

## 2017-02-03 DIAGNOSIS — E119 Type 2 diabetes mellitus without complications: Secondary | ICD-10-CM | POA: Diagnosis not present

## 2017-02-03 DIAGNOSIS — N186 End stage renal disease: Secondary | ICD-10-CM | POA: Diagnosis not present

## 2017-02-03 DIAGNOSIS — N2581 Secondary hyperparathyroidism of renal origin: Secondary | ICD-10-CM | POA: Diagnosis not present

## 2017-02-05 DIAGNOSIS — D631 Anemia in chronic kidney disease: Secondary | ICD-10-CM | POA: Diagnosis not present

## 2017-02-05 DIAGNOSIS — N186 End stage renal disease: Secondary | ICD-10-CM | POA: Diagnosis not present

## 2017-02-05 DIAGNOSIS — N2581 Secondary hyperparathyroidism of renal origin: Secondary | ICD-10-CM | POA: Diagnosis not present

## 2017-02-05 DIAGNOSIS — E119 Type 2 diabetes mellitus without complications: Secondary | ICD-10-CM | POA: Diagnosis not present

## 2017-02-08 DIAGNOSIS — D631 Anemia in chronic kidney disease: Secondary | ICD-10-CM | POA: Diagnosis not present

## 2017-02-08 DIAGNOSIS — N186 End stage renal disease: Secondary | ICD-10-CM | POA: Diagnosis not present

## 2017-02-08 DIAGNOSIS — N2581 Secondary hyperparathyroidism of renal origin: Secondary | ICD-10-CM | POA: Diagnosis not present

## 2017-02-08 DIAGNOSIS — E119 Type 2 diabetes mellitus without complications: Secondary | ICD-10-CM | POA: Diagnosis not present

## 2017-02-10 DIAGNOSIS — D631 Anemia in chronic kidney disease: Secondary | ICD-10-CM | POA: Diagnosis not present

## 2017-02-10 DIAGNOSIS — E119 Type 2 diabetes mellitus without complications: Secondary | ICD-10-CM | POA: Diagnosis not present

## 2017-02-10 DIAGNOSIS — N2581 Secondary hyperparathyroidism of renal origin: Secondary | ICD-10-CM | POA: Diagnosis not present

## 2017-02-10 DIAGNOSIS — N186 End stage renal disease: Secondary | ICD-10-CM | POA: Diagnosis not present

## 2017-02-12 DIAGNOSIS — N186 End stage renal disease: Secondary | ICD-10-CM | POA: Diagnosis not present

## 2017-02-12 DIAGNOSIS — D631 Anemia in chronic kidney disease: Secondary | ICD-10-CM | POA: Diagnosis not present

## 2017-02-12 DIAGNOSIS — N2581 Secondary hyperparathyroidism of renal origin: Secondary | ICD-10-CM | POA: Diagnosis not present

## 2017-02-12 DIAGNOSIS — E119 Type 2 diabetes mellitus without complications: Secondary | ICD-10-CM | POA: Diagnosis not present

## 2017-02-15 ENCOUNTER — Other Ambulatory Visit: Payer: Self-pay

## 2017-02-15 ENCOUNTER — Ambulatory Visit (HOSPITAL_COMMUNITY): Payer: Medicare Other | Attending: Cardiology

## 2017-02-15 DIAGNOSIS — I132 Hypertensive heart and chronic kidney disease with heart failure and with stage 5 chronic kidney disease, or end stage renal disease: Secondary | ICD-10-CM | POA: Diagnosis not present

## 2017-02-15 DIAGNOSIS — D631 Anemia in chronic kidney disease: Secondary | ICD-10-CM | POA: Diagnosis not present

## 2017-02-15 DIAGNOSIS — I10 Essential (primary) hypertension: Secondary | ICD-10-CM | POA: Diagnosis present

## 2017-02-15 DIAGNOSIS — E1122 Type 2 diabetes mellitus with diabetic chronic kidney disease: Secondary | ICD-10-CM | POA: Insufficient documentation

## 2017-02-15 DIAGNOSIS — I119 Hypertensive heart disease without heart failure: Secondary | ICD-10-CM | POA: Diagnosis not present

## 2017-02-15 DIAGNOSIS — I509 Heart failure, unspecified: Secondary | ICD-10-CM | POA: Diagnosis not present

## 2017-02-15 DIAGNOSIS — N186 End stage renal disease: Secondary | ICD-10-CM | POA: Insufficient documentation

## 2017-02-15 DIAGNOSIS — I480 Paroxysmal atrial fibrillation: Secondary | ICD-10-CM

## 2017-02-15 DIAGNOSIS — N2581 Secondary hyperparathyroidism of renal origin: Secondary | ICD-10-CM | POA: Diagnosis not present

## 2017-02-15 DIAGNOSIS — E119 Type 2 diabetes mellitus without complications: Secondary | ICD-10-CM | POA: Diagnosis not present

## 2017-02-15 DIAGNOSIS — R0609 Other forms of dyspnea: Secondary | ICD-10-CM

## 2017-02-15 DIAGNOSIS — I071 Rheumatic tricuspid insufficiency: Secondary | ICD-10-CM | POA: Insufficient documentation

## 2017-02-15 DIAGNOSIS — I340 Nonrheumatic mitral (valve) insufficiency: Secondary | ICD-10-CM | POA: Diagnosis not present

## 2017-02-16 ENCOUNTER — Ambulatory Visit: Payer: Medicare Other | Admitting: Cardiology

## 2017-02-17 DIAGNOSIS — E119 Type 2 diabetes mellitus without complications: Secondary | ICD-10-CM | POA: Diagnosis not present

## 2017-02-17 DIAGNOSIS — N2581 Secondary hyperparathyroidism of renal origin: Secondary | ICD-10-CM | POA: Diagnosis not present

## 2017-02-17 DIAGNOSIS — N186 End stage renal disease: Secondary | ICD-10-CM | POA: Diagnosis not present

## 2017-02-17 DIAGNOSIS — D631 Anemia in chronic kidney disease: Secondary | ICD-10-CM | POA: Diagnosis not present

## 2017-02-19 DIAGNOSIS — N2581 Secondary hyperparathyroidism of renal origin: Secondary | ICD-10-CM | POA: Diagnosis not present

## 2017-02-19 DIAGNOSIS — N186 End stage renal disease: Secondary | ICD-10-CM | POA: Diagnosis not present

## 2017-02-19 DIAGNOSIS — E119 Type 2 diabetes mellitus without complications: Secondary | ICD-10-CM | POA: Diagnosis not present

## 2017-02-19 DIAGNOSIS — D631 Anemia in chronic kidney disease: Secondary | ICD-10-CM | POA: Diagnosis not present

## 2017-02-22 DIAGNOSIS — N2581 Secondary hyperparathyroidism of renal origin: Secondary | ICD-10-CM | POA: Diagnosis not present

## 2017-02-22 DIAGNOSIS — E119 Type 2 diabetes mellitus without complications: Secondary | ICD-10-CM | POA: Diagnosis not present

## 2017-02-22 DIAGNOSIS — N186 End stage renal disease: Secondary | ICD-10-CM | POA: Diagnosis not present

## 2017-02-22 DIAGNOSIS — D631 Anemia in chronic kidney disease: Secondary | ICD-10-CM | POA: Diagnosis not present

## 2017-02-24 DIAGNOSIS — D631 Anemia in chronic kidney disease: Secondary | ICD-10-CM | POA: Diagnosis not present

## 2017-02-24 DIAGNOSIS — N186 End stage renal disease: Secondary | ICD-10-CM | POA: Diagnosis not present

## 2017-02-24 DIAGNOSIS — E119 Type 2 diabetes mellitus without complications: Secondary | ICD-10-CM | POA: Diagnosis not present

## 2017-02-24 DIAGNOSIS — Z992 Dependence on renal dialysis: Secondary | ICD-10-CM | POA: Diagnosis not present

## 2017-02-24 DIAGNOSIS — N2581 Secondary hyperparathyroidism of renal origin: Secondary | ICD-10-CM | POA: Diagnosis not present

## 2017-02-24 DIAGNOSIS — E1129 Type 2 diabetes mellitus with other diabetic kidney complication: Secondary | ICD-10-CM | POA: Diagnosis not present

## 2017-02-26 DIAGNOSIS — N2581 Secondary hyperparathyroidism of renal origin: Secondary | ICD-10-CM | POA: Diagnosis not present

## 2017-02-26 DIAGNOSIS — E119 Type 2 diabetes mellitus without complications: Secondary | ICD-10-CM | POA: Diagnosis not present

## 2017-02-26 DIAGNOSIS — N186 End stage renal disease: Secondary | ICD-10-CM | POA: Diagnosis not present

## 2017-02-26 DIAGNOSIS — D631 Anemia in chronic kidney disease: Secondary | ICD-10-CM | POA: Diagnosis not present

## 2017-03-01 DIAGNOSIS — N186 End stage renal disease: Secondary | ICD-10-CM | POA: Diagnosis not present

## 2017-03-01 DIAGNOSIS — E119 Type 2 diabetes mellitus without complications: Secondary | ICD-10-CM | POA: Diagnosis not present

## 2017-03-01 DIAGNOSIS — N2581 Secondary hyperparathyroidism of renal origin: Secondary | ICD-10-CM | POA: Diagnosis not present

## 2017-03-01 DIAGNOSIS — D631 Anemia in chronic kidney disease: Secondary | ICD-10-CM | POA: Diagnosis not present

## 2017-03-02 ENCOUNTER — Encounter: Payer: Self-pay | Admitting: Cardiology

## 2017-03-02 ENCOUNTER — Ambulatory Visit (INDEPENDENT_AMBULATORY_CARE_PROVIDER_SITE_OTHER): Payer: Medicare Other | Admitting: Cardiology

## 2017-03-02 VITALS — BP 128/72 | HR 94 | Ht 66.0 in | Wt 164.0 lb

## 2017-03-02 DIAGNOSIS — I5032 Chronic diastolic (congestive) heart failure: Secondary | ICD-10-CM

## 2017-03-02 DIAGNOSIS — R0609 Other forms of dyspnea: Secondary | ICD-10-CM

## 2017-03-02 DIAGNOSIS — I110 Hypertensive heart disease with heart failure: Secondary | ICD-10-CM | POA: Diagnosis not present

## 2017-03-02 DIAGNOSIS — I480 Paroxysmal atrial fibrillation: Secondary | ICD-10-CM | POA: Diagnosis not present

## 2017-03-02 DIAGNOSIS — I350 Nonrheumatic aortic (valve) stenosis: Secondary | ICD-10-CM

## 2017-03-02 DIAGNOSIS — I272 Pulmonary hypertension, unspecified: Secondary | ICD-10-CM

## 2017-03-02 DIAGNOSIS — E7849 Other hyperlipidemia: Secondary | ICD-10-CM

## 2017-03-02 DIAGNOSIS — E784 Other hyperlipidemia: Secondary | ICD-10-CM

## 2017-03-02 NOTE — Patient Instructions (Signed)
SCHEDULE  MARCH 2019 at [place] has requested that you have an echocardiogram. Echocardiography is a painless test that uses sound waves to create images of your heart. It provides your doctor with information about the size and shape of your heart and how well your heart's chambers and valves are working. This procedure takes approximately one hour. There are no restrictions for this procedure.    HAVE YOUR DAUGHTER REVIEW YOUR MEDICATION LIST -IF IT IS DIFFERENT THAN WHAT IS THERE CONTACT OFFICE. THESE ARE THE MEDICATION THAT YOU SHOULD BE TAKING  Your physician recommends that you schedule a follow-up appointment in [place] wants you to follow-up in [place]. You will receive a reminder letter in the mail two months in advance. If you don't receive a letter, please call our office to schedule the follow-up appointment.  If you need a refill on your cardiac medications before your next appointment, please call your pharmacy.

## 2017-03-02 NOTE — Progress Notes (Signed)
PCP: Elayne Snare, MD Nephrology: Dr. Mercy Moore Cardiologist: Dr. Acie Fredrickson  Clinic Note: Chief Complaint  Patient presents with  . Appointment    some tiredness and fatigue, mostly after dialysis. somem SHOB with a lot of walking. No swelling that he knows of. No CP. Is unsure of medications, his daughter does his pill box.Does not take any meds at night.  . Hypertension    HPI: [name] is a 76 y.o. male with a PMH below who presents today for 6 week follow-up after visit with essentially hypertensive dyspnea prior to dialysis. I increased his blood pressure medications and we will do 2-D echocardiogram. He is routinely followed by Dr. Acie Fredrickson but returned here for one visit. He doesn't history of paroxysmal A. fib, not on warfarin due to history of GI bleed. Mostly has been followed for hypertensive heart disease with diastolic dysfunction and A. fib. 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 He was on DOD visit and noted that he has some dyspnea on Monday morning prior to dialysis.  Recent Hospitalizations:  Studies Reviewed: \  2D Echo 02/15/2017: Mild concentric LVH. EF 60-65% no regional wall motion abnormality. Mild aortic stenosis with moderate calcification. Mild mitral regurgitation. Mild LA dilation. Mild RV dilation. Mild RA dilation. Severe TR with elevated PA pressures estimated 69 mmHg.  Interval History: Hopfensperger returns today feeling better. Breathing much - still some DOE, but much improved.  Still notes feeling tired & fatigue with some SOB after HD.   @ night in bed can feel "thumping pulse related to HD fistula" - no rapid / irregular HR to suggest Afib Sx.. No resting dyspnea.  No further chest heaviness / pain with rest or exertion. No PND, orthopnea with minimal edema.  No real palpitations, lightheadedness,  dizziness, weakness or syncope/near syncope. No TIA/amaurosis fugax symptoms. No melena, hematochezia, hematuria, or epstaxis. No claudication.  ROS: A comprehensive was performed. Review of Systems  Constitutional: Positive for malaise/fatigue. Negative for chills and fever.  HENT: Positive for hearing loss.   Respiratory: Positive for shortness of breath (per HPI).   Cardiovascular:       Per HPI  Neurological: Positive for dizziness (after HD).  Psychiatric/Behavioral: Positive for memory loss. The patient is nervous/anxious.     Past Medical History:  Diagnosis Date  . A-fib ([place])   . Anemia   . Blood transfusion   . BPH (benign prostatic hyperplasia)   . CHF (congestive heart failure) ([place])   . Chronic kidney disease   . CKD (chronic kidney disease)   . Diarrhea   . DM (diabetes mellitus) ([place])   . History of GI bleed    secondary to coumadin  . HTN (hypertension)   . Hyperlipidemia   . OSA (obstructive sleep apnea)    uses CPAP  . Secondary hyperparathyroidism of renal origin [facility])     Past Surgical History:  Procedure Laterality Date  . BVT  1/61/09   Left  Basilic Vein Transposition  . CHOLECYSTECTOMY    . EYE SURGERY     Catarct bil  . INSERTION OF DIALYSIS CATHETER  05/28/2012   Procedure: INSERTION OF DIALYSIS CATHETER;  Surgeon: Mal Misty, MD;  Location: [facility];  Service: Vascular;  Laterality: Right;  . Left arm shuntogram.    . Left forearm loop graft with 6 mm  Gore-Tex graft.    Purvis Sheffield plana vitrectomy with 25-gauge system      No outpatient prescriptions have been marked as taking for the 03/02/17 encounter (Office Visit) with Leonie Man, MD.  - he is not aware of his meds. -- daughter lays them out. Prior to Admission medications   Medication Sig Start Date End Date Taking? Authorizing Provider  acetaminophen (TYLENOL) 325 MG tablet Take 650 mg by mouth every 4 (four) hours as needed. Headache or pain    Historical Provider, MD  amLODipine  (NORVASC) 5 MG tablet Take 1 tablet (5 mg total) by mouth daily. 01/12/17 04/12/17  Leonie Man, MD  aspirin EC 325 MG tablet Take 1 tablet (325 mg total) by mouth dai ly. 02/14/15   Thayer Headings, MD  atorvastatin (LIPITOR) 40 MG tablet take 1 tablet by mouth once daily 06/21/12   Larey Dresser, MD  B Complex-C-Folic Acid (NEPHRO-VITE RX PO) Take by mouth daily.      Historical Provider, MD  calcium acetate (PHOSLO) 667 MG capsule Take 3 tablets by mouth 3 (three) times daily before meals.  02/03/11   Historical Provider, MD  metoprolol succinate (TOPROL-XL) 50 MG 24 hr tablet Take 1 tablet (50 mg total) by mouth 2 (two) times daily. Take with or immediately following a meal. 01/25/17 04/25/17  Leonie Man, MD  OVER THE COUNTER MEDICATION Take 1 tablet by mouth 2 (two) times daily as needed. Magnesium Salicylate Tetrahydrate (580 mg) [employer] brand back pain relief    Historical Provider, MD  pregabalin (LYRICA) 50 MG capsule Take 50 mg by mouth at bedtime.    Historical Provider, MD  repaglinide (PRANDIN) 0.5 MG tablet Take 0.5 mg by mouth 3 (three) times daily before meals.    Historical Provider, MD  zolpidem (AMBIEN) 5 MG tablet Take 5 mg by mouth at bedtime.    Historical Provider, MD    Allergies  Allergen Reactions  . Tape Itching    Cloth tape only    Social History   Social History  . Marital status: Divorced    Spouse name: N/A  . Number of children: N/A  . Years of education: N/A   Social History Main Topics  . Smoking status: Former Smoker    Types: Cigarettes    Quit date: 06/30/2004  . Smokeless tobacco: Never Used  . Alcohol use No  . Drug use: No  . Sexual activity: No   Other Topics Concern  . None   Social History Narrative     The patient is a former smoker, quit is 2005.  Does not     drink or abuse drugs.  Currently, lives alone at home.        Patient lives by himself but duaghter does his shopping.  He tries to do housework but Passenger transport manager does most  of the housework. She also puts out all of his meds.   family history includes Alzheimer's disease in his mother; Cancer in his daughter; Diabetes in his father and son; Heart disease in his son; Hypertension in his son.  Wt Readings from Last 3 Encounters:  03/02/17 74.4 kg (164 lb)  01/12/17 76.8 kg (169 lb 6.4 oz)  02/14/15 79.7 kg (175 lb 12.8 oz)    PHYSICAL EXAM BP 128/72   Pulse 94   Ht 5\' 6"  (1.676 m)   Wt 74.4 kg (164 lb)   SpO2 99%   BMI 26.47 kg/m  General appearance: alert,  cooperative, appears stated age, no distress and Well-nourished.well nourished & well-groomed. Neck: no adenopathy, no carotid bruit and no JVD Lungs: clear to auscultation bilaterally, normal percussion bilaterally and non-labored Heart: Irregularly irregular, S1, S2 normal, no murmur, click, rub or gallop Abdomen: soft, non-tender; bowel sounds normal; no masses,  no organomegaly;  Extremities: extremities normal, atraumatic, no cyanosis, and edema - trivial Pulses: 2+ and symmetric;  Skin: Usual dialysis related thick, scaly skin with no obvious calciphylaxis. or  Neurologic: Mental status: Alert, oriented, thought content appropriate -- is alert and oriented to person, place and time, but poor historian.   Adult ECG Report  Rate: 94 ;  Rhythm: atrial fibrillation; otherwise normal axis, intervals & durations.  Narrative Interpretation: stable EKG   Other studies Reviewed: Additional studies/ records that were reviewed today include:  Recent Labs:  No recent lipids.  Chem panel followed by HD.   ASSESSMENT / PLAN: Problem List Items Addressed This Visit    Aortic valve stenosis    Mild AS on Echo - recheck next year prior to [place].      Relevant Orders   EKG 12-Lead (Completed)   ECHOCARDIOGRAM COMPLETE   Dyspnea on exertion    2/2 HTN Heart Disease - volume control by Nephrology.  Needs to exercise - do more walking.      Hyperlipidemia (Chronic)    Back on atorvastatin - will  need labs checked after PA f/u - lipids & LFTs.      Hypertensive heart disease with chronic diastolic congestive heart failure (HCC) - Primary (Chronic)    Preserved LVEF on Echo - although Diastolic Fxn was not commented on - mild LA dilation is usually a sign of high LVEDP.  BP looks great today on Amlodipine & toprol       PAF (paroxysmal atrial fibrillation) (HCC) (Chronic)    Rate controlled - No longer on Amiodarone - & continues to be in Afib.  Toprol for rate control -- if recurrence happen - may need PRN dose.  On ASA over [facility] for CVA prophylaxis.      Pulmonary HTN    Probably related to HTN Heart Dz -- will need to monitor with echo next year.  Continue to Rx BP & avoid rapid Afib.      Relevant Orders   EKG 12-Lead (Completed)   ECHOCARDIOGRAM COMPLETE      Current medicines are reviewed at length with the patient today. (+/- concerns) na/ The following changes have been made: n/a  Patient Instructions  SCHEDULE  MARCH 2019 at [place] has requested that you have an echocardiogram. Echocardiography is a painless test that uses sound waves to create images of your heart. It provides your doctor with information about the size and shape of your heart and how well your heart's chambers and valves are working. This procedure takes approximately one hour. There are no restrictions for this procedure.    HAVE YOUR DAUGHTER REVIEW YOUR MEDICATION LIST -IF IT IS DIFFERENT THAN WHAT IS THERE CONTACT OFFICE. THESE ARE THE MEDICATION THAT YOU SHOULD BE TAKING  Your physician recommends that you schedule a follow-up appointment in [place] wants you to follow-up in [place]. You will receive a reminder letter in the mail two months in advance. If you don't receive a letter, please call our office to schedule the follow-up appointment.  If you need a refill on your  cardiac medications  before your next appointment, please call your pharmacy.      Studies Ordered:   Orders Placed This Encounter  Procedures  . EKG 12-Lead  . ECHOCARDIOGRAM COMPLETE      Glenetta Hew, M.D., M.S. Interventional Cardiologist   Pager # [phone] Phone # [phone] [address] [place], [place] [address]

## 2017-03-03 DIAGNOSIS — D631 Anemia in chronic kidney disease: Secondary | ICD-10-CM | POA: Diagnosis not present

## 2017-03-03 DIAGNOSIS — N2581 Secondary hyperparathyroidism of renal origin: Secondary | ICD-10-CM | POA: Diagnosis not present

## 2017-03-03 DIAGNOSIS — E119 Type 2 diabetes mellitus without complications: Secondary | ICD-10-CM | POA: Diagnosis not present

## 2017-03-03 DIAGNOSIS — N186 End stage renal disease: Secondary | ICD-10-CM | POA: Diagnosis not present

## 2017-03-04 ENCOUNTER — Encounter: Payer: Self-pay | Admitting: Cardiology

## 2017-03-04 DIAGNOSIS — I272 Pulmonary hypertension, unspecified: Secondary | ICD-10-CM | POA: Insufficient documentation

## 2017-03-04 DIAGNOSIS — I350 Nonrheumatic aortic (valve) stenosis: Secondary | ICD-10-CM | POA: Insufficient documentation

## 2017-03-04 NOTE — Assessment & Plan Note (Signed)
Probably related to HTN Heart Dz -- will need to monitor with echo next year.  Continue to Rx BP & avoid rapid Afib.

## 2017-03-04 NOTE — Assessment & Plan Note (Signed)
Preserved LVEF on Echo - although Diastolic Fxn was not commented on - mild LA dilation is usually a sign of high LVEDP.  BP looks great today on Amlodipine & toprol

## 2017-03-04 NOTE — Assessment & Plan Note (Signed)
Back on atorvastatin - will need labs checked after PA f/u - lipids & LFTs.

## 2017-03-04 NOTE — Assessment & Plan Note (Signed)
Rate controlled - No longer on Amiodarone - & continues to be in Afib.  Toprol for rate control -- if recurrence happen - may need PRN dose.  On ASA over [facility] for CVA prophylaxis.

## 2017-03-04 NOTE — Assessment & Plan Note (Signed)
Mild AS on Echo - recheck next year prior to [place].

## 2017-03-04 NOTE — Assessment & Plan Note (Signed)
2/2 HTN Heart Disease - volume control by Nephrology.  Needs to exercise - do more walking.

## 2017-03-05 DIAGNOSIS — D631 Anemia in chronic kidney disease: Secondary | ICD-10-CM | POA: Diagnosis not present

## 2017-03-05 DIAGNOSIS — N186 End stage renal disease: Secondary | ICD-10-CM | POA: Diagnosis not present

## 2017-03-05 DIAGNOSIS — N2581 Secondary hyperparathyroidism of renal origin: Secondary | ICD-10-CM | POA: Diagnosis not present

## 2017-03-05 DIAGNOSIS — E119 Type 2 diabetes mellitus without complications: Secondary | ICD-10-CM | POA: Diagnosis not present

## 2017-03-08 DIAGNOSIS — N186 End stage renal disease: Secondary | ICD-10-CM | POA: Diagnosis not present

## 2017-03-08 DIAGNOSIS — D631 Anemia in chronic kidney disease: Secondary | ICD-10-CM | POA: Diagnosis not present

## 2017-03-08 DIAGNOSIS — N2581 Secondary hyperparathyroidism of renal origin: Secondary | ICD-10-CM | POA: Diagnosis not present

## 2017-03-08 DIAGNOSIS — E119 Type 2 diabetes mellitus without complications: Secondary | ICD-10-CM | POA: Diagnosis not present

## 2017-03-10 DIAGNOSIS — D631 Anemia in chronic kidney disease: Secondary | ICD-10-CM | POA: Diagnosis not present

## 2017-03-10 DIAGNOSIS — N186 End stage renal disease: Secondary | ICD-10-CM | POA: Diagnosis not present

## 2017-03-10 DIAGNOSIS — E119 Type 2 diabetes mellitus without complications: Secondary | ICD-10-CM | POA: Diagnosis not present

## 2017-03-10 DIAGNOSIS — N2581 Secondary hyperparathyroidism of renal origin: Secondary | ICD-10-CM | POA: Diagnosis not present

## 2017-03-12 DIAGNOSIS — D631 Anemia in chronic kidney disease: Secondary | ICD-10-CM | POA: Diagnosis not present

## 2017-03-12 DIAGNOSIS — N186 End stage renal disease: Secondary | ICD-10-CM | POA: Diagnosis not present

## 2017-03-12 DIAGNOSIS — N2581 Secondary hyperparathyroidism of renal origin: Secondary | ICD-10-CM | POA: Diagnosis not present

## 2017-03-12 DIAGNOSIS — E119 Type 2 diabetes mellitus without complications: Secondary | ICD-10-CM | POA: Diagnosis not present

## 2017-03-15 DIAGNOSIS — N186 End stage renal disease: Secondary | ICD-10-CM | POA: Diagnosis not present

## 2017-03-15 DIAGNOSIS — N2581 Secondary hyperparathyroidism of renal origin: Secondary | ICD-10-CM | POA: Diagnosis not present

## 2017-03-15 DIAGNOSIS — E119 Type 2 diabetes mellitus without complications: Secondary | ICD-10-CM | POA: Diagnosis not present

## 2017-03-15 DIAGNOSIS — D631 Anemia in chronic kidney disease: Secondary | ICD-10-CM | POA: Diagnosis not present

## 2017-03-17 DIAGNOSIS — E119 Type 2 diabetes mellitus without complications: Secondary | ICD-10-CM | POA: Diagnosis not present

## 2017-03-17 DIAGNOSIS — D631 Anemia in chronic kidney disease: Secondary | ICD-10-CM | POA: Diagnosis not present

## 2017-03-17 DIAGNOSIS — N2581 Secondary hyperparathyroidism of renal origin: Secondary | ICD-10-CM | POA: Diagnosis not present

## 2017-03-17 DIAGNOSIS — N186 End stage renal disease: Secondary | ICD-10-CM | POA: Diagnosis not present

## 2017-03-19 DIAGNOSIS — N186 End stage renal disease: Secondary | ICD-10-CM | POA: Diagnosis not present

## 2017-03-19 DIAGNOSIS — N2581 Secondary hyperparathyroidism of renal origin: Secondary | ICD-10-CM | POA: Diagnosis not present

## 2017-03-19 DIAGNOSIS — E119 Type 2 diabetes mellitus without complications: Secondary | ICD-10-CM | POA: Diagnosis not present

## 2017-03-19 DIAGNOSIS — D631 Anemia in chronic kidney disease: Secondary | ICD-10-CM | POA: Diagnosis not present

## 2017-03-22 DIAGNOSIS — E119 Type 2 diabetes mellitus without complications: Secondary | ICD-10-CM | POA: Diagnosis not present

## 2017-03-22 DIAGNOSIS — N2581 Secondary hyperparathyroidism of renal origin: Secondary | ICD-10-CM | POA: Diagnosis not present

## 2017-03-22 DIAGNOSIS — N186 End stage renal disease: Secondary | ICD-10-CM | POA: Diagnosis not present

## 2017-03-22 DIAGNOSIS — D631 Anemia in chronic kidney disease: Secondary | ICD-10-CM | POA: Diagnosis not present

## 2017-03-24 DIAGNOSIS — N186 End stage renal disease: Secondary | ICD-10-CM | POA: Diagnosis not present

## 2017-03-24 DIAGNOSIS — E119 Type 2 diabetes mellitus without complications: Secondary | ICD-10-CM | POA: Diagnosis not present

## 2017-03-24 DIAGNOSIS — N2581 Secondary hyperparathyroidism of renal origin: Secondary | ICD-10-CM | POA: Diagnosis not present

## 2017-03-24 DIAGNOSIS — D631 Anemia in chronic kidney disease: Secondary | ICD-10-CM | POA: Diagnosis not present

## 2017-03-26 DIAGNOSIS — E119 Type 2 diabetes mellitus without complications: Secondary | ICD-10-CM | POA: Diagnosis not present

## 2017-03-26 DIAGNOSIS — D631 Anemia in chronic kidney disease: Secondary | ICD-10-CM | POA: Diagnosis not present

## 2017-03-26 DIAGNOSIS — N2581 Secondary hyperparathyroidism of renal origin: Secondary | ICD-10-CM | POA: Diagnosis not present

## 2017-03-26 DIAGNOSIS — N186 End stage renal disease: Secondary | ICD-10-CM | POA: Diagnosis not present

## 2017-03-27 DIAGNOSIS — N186 End stage renal disease: Secondary | ICD-10-CM | POA: Diagnosis not present

## 2017-03-27 DIAGNOSIS — Z992 Dependence on renal dialysis: Secondary | ICD-10-CM | POA: Diagnosis not present

## 2017-03-27 DIAGNOSIS — E1129 Type 2 diabetes mellitus with other diabetic kidney complication: Secondary | ICD-10-CM | POA: Diagnosis not present

## 2017-03-29 DIAGNOSIS — E119 Type 2 diabetes mellitus without complications: Secondary | ICD-10-CM | POA: Diagnosis not present

## 2017-03-29 DIAGNOSIS — N2581 Secondary hyperparathyroidism of renal origin: Secondary | ICD-10-CM | POA: Diagnosis not present

## 2017-03-29 DIAGNOSIS — N186 End stage renal disease: Secondary | ICD-10-CM | POA: Diagnosis not present

## 2017-03-29 DIAGNOSIS — D509 Iron deficiency anemia, unspecified: Secondary | ICD-10-CM | POA: Diagnosis not present

## 2017-03-29 DIAGNOSIS — D631 Anemia in chronic kidney disease: Secondary | ICD-10-CM | POA: Diagnosis not present

## 2017-03-31 DIAGNOSIS — N2581 Secondary hyperparathyroidism of renal origin: Secondary | ICD-10-CM | POA: Diagnosis not present

## 2017-03-31 DIAGNOSIS — N186 End stage renal disease: Secondary | ICD-10-CM | POA: Diagnosis not present

## 2017-03-31 DIAGNOSIS — D631 Anemia in chronic kidney disease: Secondary | ICD-10-CM | POA: Diagnosis not present

## 2017-03-31 DIAGNOSIS — E119 Type 2 diabetes mellitus without complications: Secondary | ICD-10-CM | POA: Diagnosis not present

## 2017-03-31 DIAGNOSIS — D509 Iron deficiency anemia, unspecified: Secondary | ICD-10-CM | POA: Diagnosis not present

## 2017-04-02 DIAGNOSIS — D509 Iron deficiency anemia, unspecified: Secondary | ICD-10-CM | POA: Diagnosis not present

## 2017-04-02 DIAGNOSIS — N186 End stage renal disease: Secondary | ICD-10-CM | POA: Diagnosis not present

## 2017-04-02 DIAGNOSIS — E119 Type 2 diabetes mellitus without complications: Secondary | ICD-10-CM | POA: Diagnosis not present

## 2017-04-02 DIAGNOSIS — D631 Anemia in chronic kidney disease: Secondary | ICD-10-CM | POA: Diagnosis not present

## 2017-04-02 DIAGNOSIS — N2581 Secondary hyperparathyroidism of renal origin: Secondary | ICD-10-CM | POA: Diagnosis not present

## 2017-04-05 DIAGNOSIS — N2581 Secondary hyperparathyroidism of renal origin: Secondary | ICD-10-CM | POA: Diagnosis not present

## 2017-04-05 DIAGNOSIS — E119 Type 2 diabetes mellitus without complications: Secondary | ICD-10-CM | POA: Diagnosis not present

## 2017-04-05 DIAGNOSIS — D509 Iron deficiency anemia, unspecified: Secondary | ICD-10-CM | POA: Diagnosis not present

## 2017-04-05 DIAGNOSIS — N186 End stage renal disease: Secondary | ICD-10-CM | POA: Diagnosis not present

## 2017-04-05 DIAGNOSIS — D631 Anemia in chronic kidney disease: Secondary | ICD-10-CM | POA: Diagnosis not present

## 2017-04-07 DIAGNOSIS — N186 End stage renal disease: Secondary | ICD-10-CM | POA: Diagnosis not present

## 2017-04-07 DIAGNOSIS — N2581 Secondary hyperparathyroidism of renal origin: Secondary | ICD-10-CM | POA: Diagnosis not present

## 2017-04-07 DIAGNOSIS — D509 Iron deficiency anemia, unspecified: Secondary | ICD-10-CM | POA: Diagnosis not present

## 2017-04-07 DIAGNOSIS — E119 Type 2 diabetes mellitus without complications: Secondary | ICD-10-CM | POA: Diagnosis not present

## 2017-04-07 DIAGNOSIS — D631 Anemia in chronic kidney disease: Secondary | ICD-10-CM | POA: Diagnosis not present

## 2017-04-09 DIAGNOSIS — D631 Anemia in chronic kidney disease: Secondary | ICD-10-CM | POA: Diagnosis not present

## 2017-04-09 DIAGNOSIS — E119 Type 2 diabetes mellitus without complications: Secondary | ICD-10-CM | POA: Diagnosis not present

## 2017-04-09 DIAGNOSIS — N2581 Secondary hyperparathyroidism of renal origin: Secondary | ICD-10-CM | POA: Diagnosis not present

## 2017-04-09 DIAGNOSIS — D509 Iron deficiency anemia, unspecified: Secondary | ICD-10-CM | POA: Diagnosis not present

## 2017-04-09 DIAGNOSIS — N186 End stage renal disease: Secondary | ICD-10-CM | POA: Diagnosis not present

## 2017-04-12 DIAGNOSIS — N2581 Secondary hyperparathyroidism of renal origin: Secondary | ICD-10-CM | POA: Diagnosis not present

## 2017-04-12 DIAGNOSIS — D631 Anemia in chronic kidney disease: Secondary | ICD-10-CM | POA: Diagnosis not present

## 2017-04-12 DIAGNOSIS — D509 Iron deficiency anemia, unspecified: Secondary | ICD-10-CM | POA: Diagnosis not present

## 2017-04-12 DIAGNOSIS — E119 Type 2 diabetes mellitus without complications: Secondary | ICD-10-CM | POA: Diagnosis not present

## 2017-04-12 DIAGNOSIS — N186 End stage renal disease: Secondary | ICD-10-CM | POA: Diagnosis not present

## 2017-04-14 DIAGNOSIS — E119 Type 2 diabetes mellitus without complications: Secondary | ICD-10-CM | POA: Diagnosis not present

## 2017-04-14 DIAGNOSIS — D509 Iron deficiency anemia, unspecified: Secondary | ICD-10-CM | POA: Diagnosis not present

## 2017-04-14 DIAGNOSIS — D631 Anemia in chronic kidney disease: Secondary | ICD-10-CM | POA: Diagnosis not present

## 2017-04-14 DIAGNOSIS — N2581 Secondary hyperparathyroidism of renal origin: Secondary | ICD-10-CM | POA: Diagnosis not present

## 2017-04-14 DIAGNOSIS — N186 End stage renal disease: Secondary | ICD-10-CM | POA: Diagnosis not present

## 2017-04-16 DIAGNOSIS — E119 Type 2 diabetes mellitus without complications: Secondary | ICD-10-CM | POA: Diagnosis not present

## 2017-04-16 DIAGNOSIS — D509 Iron deficiency anemia, unspecified: Secondary | ICD-10-CM | POA: Diagnosis not present

## 2017-04-16 DIAGNOSIS — N186 End stage renal disease: Secondary | ICD-10-CM | POA: Diagnosis not present

## 2017-04-16 DIAGNOSIS — N2581 Secondary hyperparathyroidism of renal origin: Secondary | ICD-10-CM | POA: Diagnosis not present

## 2017-04-16 DIAGNOSIS — D631 Anemia in chronic kidney disease: Secondary | ICD-10-CM | POA: Diagnosis not present

## 2017-04-19 DIAGNOSIS — N186 End stage renal disease: Secondary | ICD-10-CM | POA: Diagnosis not present

## 2017-04-19 DIAGNOSIS — N2581 Secondary hyperparathyroidism of renal origin: Secondary | ICD-10-CM | POA: Diagnosis not present

## 2017-04-19 DIAGNOSIS — D509 Iron deficiency anemia, unspecified: Secondary | ICD-10-CM | POA: Diagnosis not present

## 2017-04-19 DIAGNOSIS — E119 Type 2 diabetes mellitus without complications: Secondary | ICD-10-CM | POA: Diagnosis not present

## 2017-04-19 DIAGNOSIS — D631 Anemia in chronic kidney disease: Secondary | ICD-10-CM | POA: Diagnosis not present

## 2017-04-21 DIAGNOSIS — N186 End stage renal disease: Secondary | ICD-10-CM | POA: Diagnosis not present

## 2017-04-21 DIAGNOSIS — E119 Type 2 diabetes mellitus without complications: Secondary | ICD-10-CM | POA: Diagnosis not present

## 2017-04-21 DIAGNOSIS — D631 Anemia in chronic kidney disease: Secondary | ICD-10-CM | POA: Diagnosis not present

## 2017-04-21 DIAGNOSIS — D509 Iron deficiency anemia, unspecified: Secondary | ICD-10-CM | POA: Diagnosis not present

## 2017-04-21 DIAGNOSIS — N2581 Secondary hyperparathyroidism of renal origin: Secondary | ICD-10-CM | POA: Diagnosis not present

## 2017-04-23 DIAGNOSIS — D509 Iron deficiency anemia, unspecified: Secondary | ICD-10-CM | POA: Diagnosis not present

## 2017-04-23 DIAGNOSIS — E119 Type 2 diabetes mellitus without complications: Secondary | ICD-10-CM | POA: Diagnosis not present

## 2017-04-23 DIAGNOSIS — N186 End stage renal disease: Secondary | ICD-10-CM | POA: Diagnosis not present

## 2017-04-23 DIAGNOSIS — N2581 Secondary hyperparathyroidism of renal origin: Secondary | ICD-10-CM | POA: Diagnosis not present

## 2017-04-23 DIAGNOSIS — D631 Anemia in chronic kidney disease: Secondary | ICD-10-CM | POA: Diagnosis not present

## 2017-04-26 DIAGNOSIS — N2581 Secondary hyperparathyroidism of renal origin: Secondary | ICD-10-CM | POA: Diagnosis not present

## 2017-04-26 DIAGNOSIS — D509 Iron deficiency anemia, unspecified: Secondary | ICD-10-CM | POA: Diagnosis not present

## 2017-04-26 DIAGNOSIS — E119 Type 2 diabetes mellitus without complications: Secondary | ICD-10-CM | POA: Diagnosis not present

## 2017-04-26 DIAGNOSIS — N186 End stage renal disease: Secondary | ICD-10-CM | POA: Diagnosis not present

## 2017-04-26 DIAGNOSIS — Z992 Dependence on renal dialysis: Secondary | ICD-10-CM | POA: Diagnosis not present

## 2017-04-26 DIAGNOSIS — D631 Anemia in chronic kidney disease: Secondary | ICD-10-CM | POA: Diagnosis not present

## 2017-04-26 DIAGNOSIS — E1129 Type 2 diabetes mellitus with other diabetic kidney complication: Secondary | ICD-10-CM | POA: Diagnosis not present

## 2017-04-28 DIAGNOSIS — D631 Anemia in chronic kidney disease: Secondary | ICD-10-CM | POA: Diagnosis not present

## 2017-04-28 DIAGNOSIS — N2581 Secondary hyperparathyroidism of renal origin: Secondary | ICD-10-CM | POA: Diagnosis not present

## 2017-04-28 DIAGNOSIS — D509 Iron deficiency anemia, unspecified: Secondary | ICD-10-CM | POA: Diagnosis not present

## 2017-04-28 DIAGNOSIS — N186 End stage renal disease: Secondary | ICD-10-CM | POA: Diagnosis not present

## 2017-04-28 DIAGNOSIS — E119 Type 2 diabetes mellitus without complications: Secondary | ICD-10-CM | POA: Diagnosis not present

## 2017-04-29 ENCOUNTER — Ambulatory Visit (INDEPENDENT_AMBULATORY_CARE_PROVIDER_SITE_OTHER): Payer: Medicare Other | Admitting: Ophthalmology

## 2017-04-30 DIAGNOSIS — N186 End stage renal disease: Secondary | ICD-10-CM | POA: Diagnosis not present

## 2017-04-30 DIAGNOSIS — E119 Type 2 diabetes mellitus without complications: Secondary | ICD-10-CM | POA: Diagnosis not present

## 2017-04-30 DIAGNOSIS — D631 Anemia in chronic kidney disease: Secondary | ICD-10-CM | POA: Diagnosis not present

## 2017-04-30 DIAGNOSIS — N2581 Secondary hyperparathyroidism of renal origin: Secondary | ICD-10-CM | POA: Diagnosis not present

## 2017-04-30 DIAGNOSIS — D509 Iron deficiency anemia, unspecified: Secondary | ICD-10-CM | POA: Diagnosis not present

## 2017-05-03 DIAGNOSIS — D631 Anemia in chronic kidney disease: Secondary | ICD-10-CM | POA: Diagnosis not present

## 2017-05-03 DIAGNOSIS — E119 Type 2 diabetes mellitus without complications: Secondary | ICD-10-CM | POA: Diagnosis not present

## 2017-05-03 DIAGNOSIS — N186 End stage renal disease: Secondary | ICD-10-CM | POA: Diagnosis not present

## 2017-05-03 DIAGNOSIS — N2581 Secondary hyperparathyroidism of renal origin: Secondary | ICD-10-CM | POA: Diagnosis not present

## 2017-05-03 DIAGNOSIS — D509 Iron deficiency anemia, unspecified: Secondary | ICD-10-CM | POA: Diagnosis not present

## 2017-05-05 DIAGNOSIS — E119 Type 2 diabetes mellitus without complications: Secondary | ICD-10-CM | POA: Diagnosis not present

## 2017-05-05 DIAGNOSIS — D509 Iron deficiency anemia, unspecified: Secondary | ICD-10-CM | POA: Diagnosis not present

## 2017-05-05 DIAGNOSIS — N186 End stage renal disease: Secondary | ICD-10-CM | POA: Diagnosis not present

## 2017-05-05 DIAGNOSIS — N2581 Secondary hyperparathyroidism of renal origin: Secondary | ICD-10-CM | POA: Diagnosis not present

## 2017-05-05 DIAGNOSIS — D631 Anemia in chronic kidney disease: Secondary | ICD-10-CM | POA: Diagnosis not present

## 2017-05-07 DIAGNOSIS — N186 End stage renal disease: Secondary | ICD-10-CM | POA: Diagnosis not present

## 2017-05-07 DIAGNOSIS — E119 Type 2 diabetes mellitus without complications: Secondary | ICD-10-CM | POA: Diagnosis not present

## 2017-05-07 DIAGNOSIS — N2581 Secondary hyperparathyroidism of renal origin: Secondary | ICD-10-CM | POA: Diagnosis not present

## 2017-05-07 DIAGNOSIS — D631 Anemia in chronic kidney disease: Secondary | ICD-10-CM | POA: Diagnosis not present

## 2017-05-07 DIAGNOSIS — D509 Iron deficiency anemia, unspecified: Secondary | ICD-10-CM | POA: Diagnosis not present

## 2017-05-10 DIAGNOSIS — D509 Iron deficiency anemia, unspecified: Secondary | ICD-10-CM | POA: Diagnosis not present

## 2017-05-10 DIAGNOSIS — D631 Anemia in chronic kidney disease: Secondary | ICD-10-CM | POA: Diagnosis not present

## 2017-05-10 DIAGNOSIS — E119 Type 2 diabetes mellitus without complications: Secondary | ICD-10-CM | POA: Diagnosis not present

## 2017-05-10 DIAGNOSIS — N2581 Secondary hyperparathyroidism of renal origin: Secondary | ICD-10-CM | POA: Diagnosis not present

## 2017-05-10 DIAGNOSIS — N186 End stage renal disease: Secondary | ICD-10-CM | POA: Diagnosis not present

## 2017-05-12 DIAGNOSIS — N186 End stage renal disease: Secondary | ICD-10-CM | POA: Diagnosis not present

## 2017-05-12 DIAGNOSIS — D631 Anemia in chronic kidney disease: Secondary | ICD-10-CM | POA: Diagnosis not present

## 2017-05-12 DIAGNOSIS — D509 Iron deficiency anemia, unspecified: Secondary | ICD-10-CM | POA: Diagnosis not present

## 2017-05-12 DIAGNOSIS — E119 Type 2 diabetes mellitus without complications: Secondary | ICD-10-CM | POA: Diagnosis not present

## 2017-05-12 DIAGNOSIS — N2581 Secondary hyperparathyroidism of renal origin: Secondary | ICD-10-CM | POA: Diagnosis not present

## 2017-05-13 ENCOUNTER — Ambulatory Visit (INDEPENDENT_AMBULATORY_CARE_PROVIDER_SITE_OTHER): Payer: Medicare Other | Admitting: Ophthalmology

## 2017-05-14 DIAGNOSIS — N2581 Secondary hyperparathyroidism of renal origin: Secondary | ICD-10-CM | POA: Diagnosis not present

## 2017-05-14 DIAGNOSIS — D631 Anemia in chronic kidney disease: Secondary | ICD-10-CM | POA: Diagnosis not present

## 2017-05-14 DIAGNOSIS — D509 Iron deficiency anemia, unspecified: Secondary | ICD-10-CM | POA: Diagnosis not present

## 2017-05-14 DIAGNOSIS — E119 Type 2 diabetes mellitus without complications: Secondary | ICD-10-CM | POA: Diagnosis not present

## 2017-05-14 DIAGNOSIS — N186 End stage renal disease: Secondary | ICD-10-CM | POA: Diagnosis not present

## 2017-05-17 DIAGNOSIS — N2581 Secondary hyperparathyroidism of renal origin: Secondary | ICD-10-CM | POA: Diagnosis not present

## 2017-05-17 DIAGNOSIS — E119 Type 2 diabetes mellitus without complications: Secondary | ICD-10-CM | POA: Diagnosis not present

## 2017-05-17 DIAGNOSIS — D631 Anemia in chronic kidney disease: Secondary | ICD-10-CM | POA: Diagnosis not present

## 2017-05-17 DIAGNOSIS — N186 End stage renal disease: Secondary | ICD-10-CM | POA: Diagnosis not present

## 2017-05-17 DIAGNOSIS — D509 Iron deficiency anemia, unspecified: Secondary | ICD-10-CM | POA: Diagnosis not present

## 2017-05-19 DIAGNOSIS — N2581 Secondary hyperparathyroidism of renal origin: Secondary | ICD-10-CM | POA: Diagnosis not present

## 2017-05-19 DIAGNOSIS — N186 End stage renal disease: Secondary | ICD-10-CM | POA: Diagnosis not present

## 2017-05-19 DIAGNOSIS — E119 Type 2 diabetes mellitus without complications: Secondary | ICD-10-CM | POA: Diagnosis not present

## 2017-05-19 DIAGNOSIS — D509 Iron deficiency anemia, unspecified: Secondary | ICD-10-CM | POA: Diagnosis not present

## 2017-05-19 DIAGNOSIS — D631 Anemia in chronic kidney disease: Secondary | ICD-10-CM | POA: Diagnosis not present

## 2017-05-21 DIAGNOSIS — D509 Iron deficiency anemia, unspecified: Secondary | ICD-10-CM | POA: Diagnosis not present

## 2017-05-21 DIAGNOSIS — E119 Type 2 diabetes mellitus without complications: Secondary | ICD-10-CM | POA: Diagnosis not present

## 2017-05-21 DIAGNOSIS — N2581 Secondary hyperparathyroidism of renal origin: Secondary | ICD-10-CM | POA: Diagnosis not present

## 2017-05-21 DIAGNOSIS — N186 End stage renal disease: Secondary | ICD-10-CM | POA: Diagnosis not present

## 2017-05-21 DIAGNOSIS — D631 Anemia in chronic kidney disease: Secondary | ICD-10-CM | POA: Diagnosis not present

## 2017-05-24 DIAGNOSIS — E119 Type 2 diabetes mellitus without complications: Secondary | ICD-10-CM | POA: Diagnosis not present

## 2017-05-24 DIAGNOSIS — N186 End stage renal disease: Secondary | ICD-10-CM | POA: Diagnosis not present

## 2017-05-24 DIAGNOSIS — N2581 Secondary hyperparathyroidism of renal origin: Secondary | ICD-10-CM | POA: Diagnosis not present

## 2017-05-24 DIAGNOSIS — D631 Anemia in chronic kidney disease: Secondary | ICD-10-CM | POA: Diagnosis not present

## 2017-05-24 DIAGNOSIS — D509 Iron deficiency anemia, unspecified: Secondary | ICD-10-CM | POA: Diagnosis not present

## 2017-05-26 DIAGNOSIS — N2581 Secondary hyperparathyroidism of renal origin: Secondary | ICD-10-CM | POA: Diagnosis not present

## 2017-05-26 DIAGNOSIS — N186 End stage renal disease: Secondary | ICD-10-CM | POA: Diagnosis not present

## 2017-05-26 DIAGNOSIS — E119 Type 2 diabetes mellitus without complications: Secondary | ICD-10-CM | POA: Diagnosis not present

## 2017-05-26 DIAGNOSIS — D631 Anemia in chronic kidney disease: Secondary | ICD-10-CM | POA: Diagnosis not present

## 2017-05-26 DIAGNOSIS — D509 Iron deficiency anemia, unspecified: Secondary | ICD-10-CM | POA: Diagnosis not present

## 2017-05-27 DIAGNOSIS — N186 End stage renal disease: Secondary | ICD-10-CM | POA: Diagnosis not present

## 2017-05-27 DIAGNOSIS — E1129 Type 2 diabetes mellitus with other diabetic kidney complication: Secondary | ICD-10-CM | POA: Diagnosis not present

## 2017-05-27 DIAGNOSIS — Z992 Dependence on renal dialysis: Secondary | ICD-10-CM | POA: Diagnosis not present

## 2017-05-28 DIAGNOSIS — D631 Anemia in chronic kidney disease: Secondary | ICD-10-CM | POA: Diagnosis not present

## 2017-05-28 DIAGNOSIS — N186 End stage renal disease: Secondary | ICD-10-CM | POA: Diagnosis not present

## 2017-05-28 DIAGNOSIS — E119 Type 2 diabetes mellitus without complications: Secondary | ICD-10-CM | POA: Diagnosis not present

## 2017-05-28 DIAGNOSIS — N2581 Secondary hyperparathyroidism of renal origin: Secondary | ICD-10-CM | POA: Diagnosis not present

## 2017-05-28 DIAGNOSIS — D509 Iron deficiency anemia, unspecified: Secondary | ICD-10-CM | POA: Diagnosis not present

## 2017-05-31 DIAGNOSIS — D509 Iron deficiency anemia, unspecified: Secondary | ICD-10-CM | POA: Diagnosis not present

## 2017-05-31 DIAGNOSIS — N2581 Secondary hyperparathyroidism of renal origin: Secondary | ICD-10-CM | POA: Diagnosis not present

## 2017-05-31 DIAGNOSIS — E119 Type 2 diabetes mellitus without complications: Secondary | ICD-10-CM | POA: Diagnosis not present

## 2017-05-31 DIAGNOSIS — N186 End stage renal disease: Secondary | ICD-10-CM | POA: Diagnosis not present

## 2017-05-31 DIAGNOSIS — D631 Anemia in chronic kidney disease: Secondary | ICD-10-CM | POA: Diagnosis not present

## 2017-06-02 DIAGNOSIS — D631 Anemia in chronic kidney disease: Secondary | ICD-10-CM | POA: Diagnosis not present

## 2017-06-02 DIAGNOSIS — D509 Iron deficiency anemia, unspecified: Secondary | ICD-10-CM | POA: Diagnosis not present

## 2017-06-02 DIAGNOSIS — N186 End stage renal disease: Secondary | ICD-10-CM | POA: Diagnosis not present

## 2017-06-02 DIAGNOSIS — E119 Type 2 diabetes mellitus without complications: Secondary | ICD-10-CM | POA: Diagnosis not present

## 2017-06-02 DIAGNOSIS — N2581 Secondary hyperparathyroidism of renal origin: Secondary | ICD-10-CM | POA: Diagnosis not present

## 2017-06-04 DIAGNOSIS — N2581 Secondary hyperparathyroidism of renal origin: Secondary | ICD-10-CM | POA: Diagnosis not present

## 2017-06-04 DIAGNOSIS — D509 Iron deficiency anemia, unspecified: Secondary | ICD-10-CM | POA: Diagnosis not present

## 2017-06-04 DIAGNOSIS — E119 Type 2 diabetes mellitus without complications: Secondary | ICD-10-CM | POA: Diagnosis not present

## 2017-06-04 DIAGNOSIS — N186 End stage renal disease: Secondary | ICD-10-CM | POA: Diagnosis not present

## 2017-06-04 DIAGNOSIS — D631 Anemia in chronic kidney disease: Secondary | ICD-10-CM | POA: Diagnosis not present

## 2017-06-07 DIAGNOSIS — D631 Anemia in chronic kidney disease: Secondary | ICD-10-CM | POA: Diagnosis not present

## 2017-06-07 DIAGNOSIS — D509 Iron deficiency anemia, unspecified: Secondary | ICD-10-CM | POA: Diagnosis not present

## 2017-06-07 DIAGNOSIS — N2581 Secondary hyperparathyroidism of renal origin: Secondary | ICD-10-CM | POA: Diagnosis not present

## 2017-06-07 DIAGNOSIS — N186 End stage renal disease: Secondary | ICD-10-CM | POA: Diagnosis not present

## 2017-06-07 DIAGNOSIS — E119 Type 2 diabetes mellitus without complications: Secondary | ICD-10-CM | POA: Diagnosis not present

## 2017-06-09 DIAGNOSIS — N186 End stage renal disease: Secondary | ICD-10-CM | POA: Diagnosis not present

## 2017-06-09 DIAGNOSIS — D509 Iron deficiency anemia, unspecified: Secondary | ICD-10-CM | POA: Diagnosis not present

## 2017-06-09 DIAGNOSIS — N2581 Secondary hyperparathyroidism of renal origin: Secondary | ICD-10-CM | POA: Diagnosis not present

## 2017-06-09 DIAGNOSIS — D631 Anemia in chronic kidney disease: Secondary | ICD-10-CM | POA: Diagnosis not present

## 2017-06-09 DIAGNOSIS — E119 Type 2 diabetes mellitus without complications: Secondary | ICD-10-CM | POA: Diagnosis not present

## 2017-06-11 DIAGNOSIS — D509 Iron deficiency anemia, unspecified: Secondary | ICD-10-CM | POA: Diagnosis not present

## 2017-06-11 DIAGNOSIS — N2581 Secondary hyperparathyroidism of renal origin: Secondary | ICD-10-CM | POA: Diagnosis not present

## 2017-06-11 DIAGNOSIS — N186 End stage renal disease: Secondary | ICD-10-CM | POA: Diagnosis not present

## 2017-06-11 DIAGNOSIS — D631 Anemia in chronic kidney disease: Secondary | ICD-10-CM | POA: Diagnosis not present

## 2017-06-11 DIAGNOSIS — E119 Type 2 diabetes mellitus without complications: Secondary | ICD-10-CM | POA: Diagnosis not present

## 2017-06-14 DIAGNOSIS — N186 End stage renal disease: Secondary | ICD-10-CM | POA: Diagnosis not present

## 2017-06-14 DIAGNOSIS — E119 Type 2 diabetes mellitus without complications: Secondary | ICD-10-CM | POA: Diagnosis not present

## 2017-06-14 DIAGNOSIS — D509 Iron deficiency anemia, unspecified: Secondary | ICD-10-CM | POA: Diagnosis not present

## 2017-06-14 DIAGNOSIS — N2581 Secondary hyperparathyroidism of renal origin: Secondary | ICD-10-CM | POA: Diagnosis not present

## 2017-06-14 DIAGNOSIS — D631 Anemia in chronic kidney disease: Secondary | ICD-10-CM | POA: Diagnosis not present

## 2017-06-16 DIAGNOSIS — D509 Iron deficiency anemia, unspecified: Secondary | ICD-10-CM | POA: Diagnosis not present

## 2017-06-16 DIAGNOSIS — D631 Anemia in chronic kidney disease: Secondary | ICD-10-CM | POA: Diagnosis not present

## 2017-06-16 DIAGNOSIS — N2581 Secondary hyperparathyroidism of renal origin: Secondary | ICD-10-CM | POA: Diagnosis not present

## 2017-06-16 DIAGNOSIS — N186 End stage renal disease: Secondary | ICD-10-CM | POA: Diagnosis not present

## 2017-06-16 DIAGNOSIS — E119 Type 2 diabetes mellitus without complications: Secondary | ICD-10-CM | POA: Diagnosis not present

## 2017-06-18 DIAGNOSIS — D631 Anemia in chronic kidney disease: Secondary | ICD-10-CM | POA: Diagnosis not present

## 2017-06-18 DIAGNOSIS — D509 Iron deficiency anemia, unspecified: Secondary | ICD-10-CM | POA: Diagnosis not present

## 2017-06-18 DIAGNOSIS — E119 Type 2 diabetes mellitus without complications: Secondary | ICD-10-CM | POA: Diagnosis not present

## 2017-06-18 DIAGNOSIS — N186 End stage renal disease: Secondary | ICD-10-CM | POA: Diagnosis not present

## 2017-06-18 DIAGNOSIS — N2581 Secondary hyperparathyroidism of renal origin: Secondary | ICD-10-CM | POA: Diagnosis not present

## 2017-06-21 DIAGNOSIS — E119 Type 2 diabetes mellitus without complications: Secondary | ICD-10-CM | POA: Diagnosis not present

## 2017-06-21 DIAGNOSIS — D509 Iron deficiency anemia, unspecified: Secondary | ICD-10-CM | POA: Diagnosis not present

## 2017-06-21 DIAGNOSIS — N2581 Secondary hyperparathyroidism of renal origin: Secondary | ICD-10-CM | POA: Diagnosis not present

## 2017-06-21 DIAGNOSIS — D631 Anemia in chronic kidney disease: Secondary | ICD-10-CM | POA: Diagnosis not present

## 2017-06-21 DIAGNOSIS — N186 End stage renal disease: Secondary | ICD-10-CM | POA: Diagnosis not present

## 2017-06-23 DIAGNOSIS — N2581 Secondary hyperparathyroidism of renal origin: Secondary | ICD-10-CM | POA: Diagnosis not present

## 2017-06-23 DIAGNOSIS — D509 Iron deficiency anemia, unspecified: Secondary | ICD-10-CM | POA: Diagnosis not present

## 2017-06-23 DIAGNOSIS — N186 End stage renal disease: Secondary | ICD-10-CM | POA: Diagnosis not present

## 2017-06-23 DIAGNOSIS — E119 Type 2 diabetes mellitus without complications: Secondary | ICD-10-CM | POA: Diagnosis not present

## 2017-06-23 DIAGNOSIS — D631 Anemia in chronic kidney disease: Secondary | ICD-10-CM | POA: Diagnosis not present

## 2017-06-25 DIAGNOSIS — E119 Type 2 diabetes mellitus without complications: Secondary | ICD-10-CM | POA: Diagnosis not present

## 2017-06-25 DIAGNOSIS — N2581 Secondary hyperparathyroidism of renal origin: Secondary | ICD-10-CM | POA: Diagnosis not present

## 2017-06-25 DIAGNOSIS — N186 End stage renal disease: Secondary | ICD-10-CM | POA: Diagnosis not present

## 2017-06-25 DIAGNOSIS — D631 Anemia in chronic kidney disease: Secondary | ICD-10-CM | POA: Diagnosis not present

## 2017-06-25 DIAGNOSIS — D509 Iron deficiency anemia, unspecified: Secondary | ICD-10-CM | POA: Diagnosis not present

## 2017-06-26 DIAGNOSIS — E1129 Type 2 diabetes mellitus with other diabetic kidney complication: Secondary | ICD-10-CM | POA: Diagnosis not present

## 2017-06-26 DIAGNOSIS — N186 End stage renal disease: Secondary | ICD-10-CM | POA: Diagnosis not present

## 2017-06-26 DIAGNOSIS — Z992 Dependence on renal dialysis: Secondary | ICD-10-CM | POA: Diagnosis not present

## 2017-06-28 DIAGNOSIS — D509 Iron deficiency anemia, unspecified: Secondary | ICD-10-CM | POA: Diagnosis not present

## 2017-06-28 DIAGNOSIS — N186 End stage renal disease: Secondary | ICD-10-CM | POA: Diagnosis not present

## 2017-06-28 DIAGNOSIS — E119 Type 2 diabetes mellitus without complications: Secondary | ICD-10-CM | POA: Diagnosis not present

## 2017-06-28 DIAGNOSIS — N2581 Secondary hyperparathyroidism of renal origin: Secondary | ICD-10-CM | POA: Diagnosis not present

## 2017-06-28 DIAGNOSIS — D631 Anemia in chronic kidney disease: Secondary | ICD-10-CM | POA: Diagnosis not present

## 2017-06-30 DIAGNOSIS — N2581 Secondary hyperparathyroidism of renal origin: Secondary | ICD-10-CM | POA: Diagnosis not present

## 2017-06-30 DIAGNOSIS — D631 Anemia in chronic kidney disease: Secondary | ICD-10-CM | POA: Diagnosis not present

## 2017-06-30 DIAGNOSIS — N186 End stage renal disease: Secondary | ICD-10-CM | POA: Diagnosis not present

## 2017-06-30 DIAGNOSIS — D509 Iron deficiency anemia, unspecified: Secondary | ICD-10-CM | POA: Diagnosis not present

## 2017-06-30 DIAGNOSIS — E119 Type 2 diabetes mellitus without complications: Secondary | ICD-10-CM | POA: Diagnosis not present

## 2017-07-02 DIAGNOSIS — E119 Type 2 diabetes mellitus without complications: Secondary | ICD-10-CM | POA: Diagnosis not present

## 2017-07-02 DIAGNOSIS — D509 Iron deficiency anemia, unspecified: Secondary | ICD-10-CM | POA: Diagnosis not present

## 2017-07-02 DIAGNOSIS — D631 Anemia in chronic kidney disease: Secondary | ICD-10-CM | POA: Diagnosis not present

## 2017-07-02 DIAGNOSIS — N2581 Secondary hyperparathyroidism of renal origin: Secondary | ICD-10-CM | POA: Diagnosis not present

## 2017-07-02 DIAGNOSIS — N186 End stage renal disease: Secondary | ICD-10-CM | POA: Diagnosis not present

## 2017-07-05 DIAGNOSIS — D631 Anemia in chronic kidney disease: Secondary | ICD-10-CM | POA: Diagnosis not present

## 2017-07-05 DIAGNOSIS — N186 End stage renal disease: Secondary | ICD-10-CM | POA: Diagnosis not present

## 2017-07-05 DIAGNOSIS — N2581 Secondary hyperparathyroidism of renal origin: Secondary | ICD-10-CM | POA: Diagnosis not present

## 2017-07-05 DIAGNOSIS — D509 Iron deficiency anemia, unspecified: Secondary | ICD-10-CM | POA: Diagnosis not present

## 2017-07-05 DIAGNOSIS — E119 Type 2 diabetes mellitus without complications: Secondary | ICD-10-CM | POA: Diagnosis not present

## 2017-07-07 DIAGNOSIS — D509 Iron deficiency anemia, unspecified: Secondary | ICD-10-CM | POA: Diagnosis not present

## 2017-07-07 DIAGNOSIS — N2581 Secondary hyperparathyroidism of renal origin: Secondary | ICD-10-CM | POA: Diagnosis not present

## 2017-07-07 DIAGNOSIS — E119 Type 2 diabetes mellitus without complications: Secondary | ICD-10-CM | POA: Diagnosis not present

## 2017-07-07 DIAGNOSIS — N186 End stage renal disease: Secondary | ICD-10-CM | POA: Diagnosis not present

## 2017-07-07 DIAGNOSIS — D631 Anemia in chronic kidney disease: Secondary | ICD-10-CM | POA: Diagnosis not present

## 2017-07-09 DIAGNOSIS — D631 Anemia in chronic kidney disease: Secondary | ICD-10-CM | POA: Diagnosis not present

## 2017-07-09 DIAGNOSIS — N186 End stage renal disease: Secondary | ICD-10-CM | POA: Diagnosis not present

## 2017-07-09 DIAGNOSIS — E119 Type 2 diabetes mellitus without complications: Secondary | ICD-10-CM | POA: Diagnosis not present

## 2017-07-09 DIAGNOSIS — D509 Iron deficiency anemia, unspecified: Secondary | ICD-10-CM | POA: Diagnosis not present

## 2017-07-09 DIAGNOSIS — N2581 Secondary hyperparathyroidism of renal origin: Secondary | ICD-10-CM | POA: Diagnosis not present

## 2017-07-12 DIAGNOSIS — N2581 Secondary hyperparathyroidism of renal origin: Secondary | ICD-10-CM | POA: Diagnosis not present

## 2017-07-12 DIAGNOSIS — D509 Iron deficiency anemia, unspecified: Secondary | ICD-10-CM | POA: Diagnosis not present

## 2017-07-12 DIAGNOSIS — D631 Anemia in chronic kidney disease: Secondary | ICD-10-CM | POA: Diagnosis not present

## 2017-07-12 DIAGNOSIS — N186 End stage renal disease: Secondary | ICD-10-CM | POA: Diagnosis not present

## 2017-07-12 DIAGNOSIS — E119 Type 2 diabetes mellitus without complications: Secondary | ICD-10-CM | POA: Diagnosis not present

## 2017-07-14 DIAGNOSIS — D509 Iron deficiency anemia, unspecified: Secondary | ICD-10-CM | POA: Diagnosis not present

## 2017-07-14 DIAGNOSIS — E119 Type 2 diabetes mellitus without complications: Secondary | ICD-10-CM | POA: Diagnosis not present

## 2017-07-14 DIAGNOSIS — N2581 Secondary hyperparathyroidism of renal origin: Secondary | ICD-10-CM | POA: Diagnosis not present

## 2017-07-14 DIAGNOSIS — N186 End stage renal disease: Secondary | ICD-10-CM | POA: Diagnosis not present

## 2017-07-14 DIAGNOSIS — D631 Anemia in chronic kidney disease: Secondary | ICD-10-CM | POA: Diagnosis not present

## 2017-07-16 DIAGNOSIS — D509 Iron deficiency anemia, unspecified: Secondary | ICD-10-CM | POA: Diagnosis not present

## 2017-07-16 DIAGNOSIS — N2581 Secondary hyperparathyroidism of renal origin: Secondary | ICD-10-CM | POA: Diagnosis not present

## 2017-07-16 DIAGNOSIS — N186 End stage renal disease: Secondary | ICD-10-CM | POA: Diagnosis not present

## 2017-07-16 DIAGNOSIS — E119 Type 2 diabetes mellitus without complications: Secondary | ICD-10-CM | POA: Diagnosis not present

## 2017-07-16 DIAGNOSIS — D631 Anemia in chronic kidney disease: Secondary | ICD-10-CM | POA: Diagnosis not present

## 2017-07-19 DIAGNOSIS — E119 Type 2 diabetes mellitus without complications: Secondary | ICD-10-CM | POA: Diagnosis not present

## 2017-07-19 DIAGNOSIS — D509 Iron deficiency anemia, unspecified: Secondary | ICD-10-CM | POA: Diagnosis not present

## 2017-07-19 DIAGNOSIS — N2581 Secondary hyperparathyroidism of renal origin: Secondary | ICD-10-CM | POA: Diagnosis not present

## 2017-07-19 DIAGNOSIS — N186 End stage renal disease: Secondary | ICD-10-CM | POA: Diagnosis not present

## 2017-07-19 DIAGNOSIS — D631 Anemia in chronic kidney disease: Secondary | ICD-10-CM | POA: Diagnosis not present

## 2017-07-21 DIAGNOSIS — E119 Type 2 diabetes mellitus without complications: Secondary | ICD-10-CM | POA: Diagnosis not present

## 2017-07-21 DIAGNOSIS — D509 Iron deficiency anemia, unspecified: Secondary | ICD-10-CM | POA: Diagnosis not present

## 2017-07-21 DIAGNOSIS — D631 Anemia in chronic kidney disease: Secondary | ICD-10-CM | POA: Diagnosis not present

## 2017-07-21 DIAGNOSIS — N2581 Secondary hyperparathyroidism of renal origin: Secondary | ICD-10-CM | POA: Diagnosis not present

## 2017-07-21 DIAGNOSIS — N186 End stage renal disease: Secondary | ICD-10-CM | POA: Diagnosis not present

## 2017-07-23 DIAGNOSIS — E119 Type 2 diabetes mellitus without complications: Secondary | ICD-10-CM | POA: Diagnosis not present

## 2017-07-23 DIAGNOSIS — N186 End stage renal disease: Secondary | ICD-10-CM | POA: Diagnosis not present

## 2017-07-23 DIAGNOSIS — D631 Anemia in chronic kidney disease: Secondary | ICD-10-CM | POA: Diagnosis not present

## 2017-07-23 DIAGNOSIS — D509 Iron deficiency anemia, unspecified: Secondary | ICD-10-CM | POA: Diagnosis not present

## 2017-07-23 DIAGNOSIS — N2581 Secondary hyperparathyroidism of renal origin: Secondary | ICD-10-CM | POA: Diagnosis not present

## 2017-07-26 DIAGNOSIS — D509 Iron deficiency anemia, unspecified: Secondary | ICD-10-CM | POA: Diagnosis not present

## 2017-07-26 DIAGNOSIS — N186 End stage renal disease: Secondary | ICD-10-CM | POA: Diagnosis not present

## 2017-07-26 DIAGNOSIS — E119 Type 2 diabetes mellitus without complications: Secondary | ICD-10-CM | POA: Diagnosis not present

## 2017-07-26 DIAGNOSIS — N2581 Secondary hyperparathyroidism of renal origin: Secondary | ICD-10-CM | POA: Diagnosis not present

## 2017-07-26 DIAGNOSIS — D631 Anemia in chronic kidney disease: Secondary | ICD-10-CM | POA: Diagnosis not present

## 2017-07-27 DIAGNOSIS — Z992 Dependence on renal dialysis: Secondary | ICD-10-CM | POA: Diagnosis not present

## 2017-07-27 DIAGNOSIS — E1129 Type 2 diabetes mellitus with other diabetic kidney complication: Secondary | ICD-10-CM | POA: Diagnosis not present

## 2017-07-27 DIAGNOSIS — N186 End stage renal disease: Secondary | ICD-10-CM | POA: Diagnosis not present

## 2017-07-28 DIAGNOSIS — E119 Type 2 diabetes mellitus without complications: Secondary | ICD-10-CM | POA: Diagnosis not present

## 2017-07-28 DIAGNOSIS — D631 Anemia in chronic kidney disease: Secondary | ICD-10-CM | POA: Diagnosis not present

## 2017-07-28 DIAGNOSIS — N2581 Secondary hyperparathyroidism of renal origin: Secondary | ICD-10-CM | POA: Diagnosis not present

## 2017-07-28 DIAGNOSIS — N186 End stage renal disease: Secondary | ICD-10-CM | POA: Diagnosis not present

## 2017-07-30 DIAGNOSIS — N2581 Secondary hyperparathyroidism of renal origin: Secondary | ICD-10-CM | POA: Diagnosis not present

## 2017-07-30 DIAGNOSIS — E119 Type 2 diabetes mellitus without complications: Secondary | ICD-10-CM | POA: Diagnosis not present

## 2017-07-30 DIAGNOSIS — N186 End stage renal disease: Secondary | ICD-10-CM | POA: Diagnosis not present

## 2017-07-30 DIAGNOSIS — D631 Anemia in chronic kidney disease: Secondary | ICD-10-CM | POA: Diagnosis not present

## 2017-08-02 DIAGNOSIS — N2581 Secondary hyperparathyroidism of renal origin: Secondary | ICD-10-CM | POA: Diagnosis not present

## 2017-08-02 DIAGNOSIS — N186 End stage renal disease: Secondary | ICD-10-CM | POA: Diagnosis not present

## 2017-08-02 DIAGNOSIS — D631 Anemia in chronic kidney disease: Secondary | ICD-10-CM | POA: Diagnosis not present

## 2017-08-02 DIAGNOSIS — E119 Type 2 diabetes mellitus without complications: Secondary | ICD-10-CM | POA: Diagnosis not present

## 2017-08-04 DIAGNOSIS — N2581 Secondary hyperparathyroidism of renal origin: Secondary | ICD-10-CM | POA: Diagnosis not present

## 2017-08-04 DIAGNOSIS — N186 End stage renal disease: Secondary | ICD-10-CM | POA: Diagnosis not present

## 2017-08-04 DIAGNOSIS — E119 Type 2 diabetes mellitus without complications: Secondary | ICD-10-CM | POA: Diagnosis not present

## 2017-08-04 DIAGNOSIS — D631 Anemia in chronic kidney disease: Secondary | ICD-10-CM | POA: Diagnosis not present

## 2017-08-06 DIAGNOSIS — D631 Anemia in chronic kidney disease: Secondary | ICD-10-CM | POA: Diagnosis not present

## 2017-08-06 DIAGNOSIS — E119 Type 2 diabetes mellitus without complications: Secondary | ICD-10-CM | POA: Diagnosis not present

## 2017-08-06 DIAGNOSIS — N186 End stage renal disease: Secondary | ICD-10-CM | POA: Diagnosis not present

## 2017-08-06 DIAGNOSIS — N2581 Secondary hyperparathyroidism of renal origin: Secondary | ICD-10-CM | POA: Diagnosis not present

## 2017-08-09 DIAGNOSIS — E119 Type 2 diabetes mellitus without complications: Secondary | ICD-10-CM | POA: Diagnosis not present

## 2017-08-09 DIAGNOSIS — D631 Anemia in chronic kidney disease: Secondary | ICD-10-CM | POA: Diagnosis not present

## 2017-08-09 DIAGNOSIS — N2581 Secondary hyperparathyroidism of renal origin: Secondary | ICD-10-CM | POA: Diagnosis not present

## 2017-08-09 DIAGNOSIS — N186 End stage renal disease: Secondary | ICD-10-CM | POA: Diagnosis not present

## 2017-08-11 DIAGNOSIS — N2581 Secondary hyperparathyroidism of renal origin: Secondary | ICD-10-CM | POA: Diagnosis not present

## 2017-08-11 DIAGNOSIS — N186 End stage renal disease: Secondary | ICD-10-CM | POA: Diagnosis not present

## 2017-08-11 DIAGNOSIS — E119 Type 2 diabetes mellitus without complications: Secondary | ICD-10-CM | POA: Diagnosis not present

## 2017-08-11 DIAGNOSIS — D631 Anemia in chronic kidney disease: Secondary | ICD-10-CM | POA: Diagnosis not present

## 2017-08-13 DIAGNOSIS — N2581 Secondary hyperparathyroidism of renal origin: Secondary | ICD-10-CM | POA: Diagnosis not present

## 2017-08-13 DIAGNOSIS — E119 Type 2 diabetes mellitus without complications: Secondary | ICD-10-CM | POA: Diagnosis not present

## 2017-08-13 DIAGNOSIS — N186 End stage renal disease: Secondary | ICD-10-CM | POA: Diagnosis not present

## 2017-08-13 DIAGNOSIS — D631 Anemia in chronic kidney disease: Secondary | ICD-10-CM | POA: Diagnosis not present

## 2017-08-16 DIAGNOSIS — N2581 Secondary hyperparathyroidism of renal origin: Secondary | ICD-10-CM | POA: Diagnosis not present

## 2017-08-16 DIAGNOSIS — N186 End stage renal disease: Secondary | ICD-10-CM | POA: Diagnosis not present

## 2017-08-16 DIAGNOSIS — D631 Anemia in chronic kidney disease: Secondary | ICD-10-CM | POA: Diagnosis not present

## 2017-08-16 DIAGNOSIS — E119 Type 2 diabetes mellitus without complications: Secondary | ICD-10-CM | POA: Diagnosis not present

## 2017-08-18 DIAGNOSIS — N186 End stage renal disease: Secondary | ICD-10-CM | POA: Diagnosis not present

## 2017-08-18 DIAGNOSIS — N2581 Secondary hyperparathyroidism of renal origin: Secondary | ICD-10-CM | POA: Diagnosis not present

## 2017-08-18 DIAGNOSIS — E119 Type 2 diabetes mellitus without complications: Secondary | ICD-10-CM | POA: Diagnosis not present

## 2017-08-18 DIAGNOSIS — D631 Anemia in chronic kidney disease: Secondary | ICD-10-CM | POA: Diagnosis not present

## 2017-08-20 DIAGNOSIS — E119 Type 2 diabetes mellitus without complications: Secondary | ICD-10-CM | POA: Diagnosis not present

## 2017-08-20 DIAGNOSIS — N2581 Secondary hyperparathyroidism of renal origin: Secondary | ICD-10-CM | POA: Diagnosis not present

## 2017-08-20 DIAGNOSIS — D631 Anemia in chronic kidney disease: Secondary | ICD-10-CM | POA: Diagnosis not present

## 2017-08-20 DIAGNOSIS — N186 End stage renal disease: Secondary | ICD-10-CM | POA: Diagnosis not present

## 2017-08-23 DIAGNOSIS — E119 Type 2 diabetes mellitus without complications: Secondary | ICD-10-CM | POA: Diagnosis not present

## 2017-08-23 DIAGNOSIS — N186 End stage renal disease: Secondary | ICD-10-CM | POA: Diagnosis not present

## 2017-08-23 DIAGNOSIS — D631 Anemia in chronic kidney disease: Secondary | ICD-10-CM | POA: Diagnosis not present

## 2017-08-23 DIAGNOSIS — N2581 Secondary hyperparathyroidism of renal origin: Secondary | ICD-10-CM | POA: Diagnosis not present

## 2017-08-25 DIAGNOSIS — E119 Type 2 diabetes mellitus without complications: Secondary | ICD-10-CM | POA: Diagnosis not present

## 2017-08-25 DIAGNOSIS — N186 End stage renal disease: Secondary | ICD-10-CM | POA: Diagnosis not present

## 2017-08-25 DIAGNOSIS — D631 Anemia in chronic kidney disease: Secondary | ICD-10-CM | POA: Diagnosis not present

## 2017-08-25 DIAGNOSIS — N2581 Secondary hyperparathyroidism of renal origin: Secondary | ICD-10-CM | POA: Diagnosis not present

## 2017-08-27 DIAGNOSIS — N186 End stage renal disease: Secondary | ICD-10-CM | POA: Diagnosis not present

## 2017-08-27 DIAGNOSIS — D631 Anemia in chronic kidney disease: Secondary | ICD-10-CM | POA: Diagnosis not present

## 2017-08-27 DIAGNOSIS — E1129 Type 2 diabetes mellitus with other diabetic kidney complication: Secondary | ICD-10-CM | POA: Diagnosis not present

## 2017-08-27 DIAGNOSIS — N2581 Secondary hyperparathyroidism of renal origin: Secondary | ICD-10-CM | POA: Diagnosis not present

## 2017-08-27 DIAGNOSIS — Z992 Dependence on renal dialysis: Secondary | ICD-10-CM | POA: Diagnosis not present

## 2017-08-27 DIAGNOSIS — E119 Type 2 diabetes mellitus without complications: Secondary | ICD-10-CM | POA: Diagnosis not present

## 2017-08-30 DIAGNOSIS — D631 Anemia in chronic kidney disease: Secondary | ICD-10-CM | POA: Diagnosis not present

## 2017-08-30 DIAGNOSIS — N186 End stage renal disease: Secondary | ICD-10-CM | POA: Diagnosis not present

## 2017-08-30 DIAGNOSIS — E119 Type 2 diabetes mellitus without complications: Secondary | ICD-10-CM | POA: Diagnosis not present

## 2017-08-30 DIAGNOSIS — N2581 Secondary hyperparathyroidism of renal origin: Secondary | ICD-10-CM | POA: Diagnosis not present

## 2017-08-30 DIAGNOSIS — Z23 Encounter for immunization: Secondary | ICD-10-CM | POA: Diagnosis not present

## 2017-09-01 DIAGNOSIS — N186 End stage renal disease: Secondary | ICD-10-CM | POA: Diagnosis not present

## 2017-09-01 DIAGNOSIS — Z23 Encounter for immunization: Secondary | ICD-10-CM | POA: Diagnosis not present

## 2017-09-01 DIAGNOSIS — E119 Type 2 diabetes mellitus without complications: Secondary | ICD-10-CM | POA: Diagnosis not present

## 2017-09-01 DIAGNOSIS — N2581 Secondary hyperparathyroidism of renal origin: Secondary | ICD-10-CM | POA: Diagnosis not present

## 2017-09-01 DIAGNOSIS — D631 Anemia in chronic kidney disease: Secondary | ICD-10-CM | POA: Diagnosis not present

## 2017-09-03 DIAGNOSIS — N186 End stage renal disease: Secondary | ICD-10-CM | POA: Diagnosis not present

## 2017-09-03 DIAGNOSIS — E119 Type 2 diabetes mellitus without complications: Secondary | ICD-10-CM | POA: Diagnosis not present

## 2017-09-03 DIAGNOSIS — N2581 Secondary hyperparathyroidism of renal origin: Secondary | ICD-10-CM | POA: Diagnosis not present

## 2017-09-03 DIAGNOSIS — Z23 Encounter for immunization: Secondary | ICD-10-CM | POA: Diagnosis not present

## 2017-09-03 DIAGNOSIS — D631 Anemia in chronic kidney disease: Secondary | ICD-10-CM | POA: Diagnosis not present

## 2017-09-06 DIAGNOSIS — Z23 Encounter for immunization: Secondary | ICD-10-CM | POA: Diagnosis not present

## 2017-09-06 DIAGNOSIS — E119 Type 2 diabetes mellitus without complications: Secondary | ICD-10-CM | POA: Diagnosis not present

## 2017-09-06 DIAGNOSIS — N2581 Secondary hyperparathyroidism of renal origin: Secondary | ICD-10-CM | POA: Diagnosis not present

## 2017-09-06 DIAGNOSIS — N186 End stage renal disease: Secondary | ICD-10-CM | POA: Diagnosis not present

## 2017-09-06 DIAGNOSIS — D631 Anemia in chronic kidney disease: Secondary | ICD-10-CM | POA: Diagnosis not present

## 2017-09-08 DIAGNOSIS — N2581 Secondary hyperparathyroidism of renal origin: Secondary | ICD-10-CM | POA: Diagnosis not present

## 2017-09-08 DIAGNOSIS — E119 Type 2 diabetes mellitus without complications: Secondary | ICD-10-CM | POA: Diagnosis not present

## 2017-09-08 DIAGNOSIS — N186 End stage renal disease: Secondary | ICD-10-CM | POA: Diagnosis not present

## 2017-09-08 DIAGNOSIS — D631 Anemia in chronic kidney disease: Secondary | ICD-10-CM | POA: Diagnosis not present

## 2017-09-08 DIAGNOSIS — Z23 Encounter for immunization: Secondary | ICD-10-CM | POA: Diagnosis not present

## 2017-09-10 DIAGNOSIS — E119 Type 2 diabetes mellitus without complications: Secondary | ICD-10-CM | POA: Diagnosis not present

## 2017-09-10 DIAGNOSIS — N2581 Secondary hyperparathyroidism of renal origin: Secondary | ICD-10-CM | POA: Diagnosis not present

## 2017-09-10 DIAGNOSIS — Z23 Encounter for immunization: Secondary | ICD-10-CM | POA: Diagnosis not present

## 2017-09-10 DIAGNOSIS — D631 Anemia in chronic kidney disease: Secondary | ICD-10-CM | POA: Diagnosis not present

## 2017-09-10 DIAGNOSIS — N186 End stage renal disease: Secondary | ICD-10-CM | POA: Diagnosis not present

## 2017-09-13 DIAGNOSIS — D631 Anemia in chronic kidney disease: Secondary | ICD-10-CM | POA: Diagnosis not present

## 2017-09-13 DIAGNOSIS — N2581 Secondary hyperparathyroidism of renal origin: Secondary | ICD-10-CM | POA: Diagnosis not present

## 2017-09-13 DIAGNOSIS — E119 Type 2 diabetes mellitus without complications: Secondary | ICD-10-CM | POA: Diagnosis not present

## 2017-09-13 DIAGNOSIS — Z23 Encounter for immunization: Secondary | ICD-10-CM | POA: Diagnosis not present

## 2017-09-13 DIAGNOSIS — N186 End stage renal disease: Secondary | ICD-10-CM | POA: Diagnosis not present

## 2017-09-15 DIAGNOSIS — N2581 Secondary hyperparathyroidism of renal origin: Secondary | ICD-10-CM | POA: Diagnosis not present

## 2017-09-15 DIAGNOSIS — Z23 Encounter for immunization: Secondary | ICD-10-CM | POA: Diagnosis not present

## 2017-09-15 DIAGNOSIS — E119 Type 2 diabetes mellitus without complications: Secondary | ICD-10-CM | POA: Diagnosis not present

## 2017-09-15 DIAGNOSIS — D631 Anemia in chronic kidney disease: Secondary | ICD-10-CM | POA: Diagnosis not present

## 2017-09-15 DIAGNOSIS — N186 End stage renal disease: Secondary | ICD-10-CM | POA: Diagnosis not present

## 2017-09-17 DIAGNOSIS — Z23 Encounter for immunization: Secondary | ICD-10-CM | POA: Diagnosis not present

## 2017-09-17 DIAGNOSIS — D631 Anemia in chronic kidney disease: Secondary | ICD-10-CM | POA: Diagnosis not present

## 2017-09-17 DIAGNOSIS — N2581 Secondary hyperparathyroidism of renal origin: Secondary | ICD-10-CM | POA: Diagnosis not present

## 2017-09-17 DIAGNOSIS — E119 Type 2 diabetes mellitus without complications: Secondary | ICD-10-CM | POA: Diagnosis not present

## 2017-09-17 DIAGNOSIS — N186 End stage renal disease: Secondary | ICD-10-CM | POA: Diagnosis not present

## 2017-09-20 DIAGNOSIS — E119 Type 2 diabetes mellitus without complications: Secondary | ICD-10-CM | POA: Diagnosis not present

## 2017-09-20 DIAGNOSIS — Z23 Encounter for immunization: Secondary | ICD-10-CM | POA: Diagnosis not present

## 2017-09-20 DIAGNOSIS — N186 End stage renal disease: Secondary | ICD-10-CM | POA: Diagnosis not present

## 2017-09-20 DIAGNOSIS — N2581 Secondary hyperparathyroidism of renal origin: Secondary | ICD-10-CM | POA: Diagnosis not present

## 2017-09-20 DIAGNOSIS — D631 Anemia in chronic kidney disease: Secondary | ICD-10-CM | POA: Diagnosis not present

## 2017-09-22 DIAGNOSIS — E119 Type 2 diabetes mellitus without complications: Secondary | ICD-10-CM | POA: Diagnosis not present

## 2017-09-22 DIAGNOSIS — D631 Anemia in chronic kidney disease: Secondary | ICD-10-CM | POA: Diagnosis not present

## 2017-09-22 DIAGNOSIS — Z23 Encounter for immunization: Secondary | ICD-10-CM | POA: Diagnosis not present

## 2017-09-22 DIAGNOSIS — N2581 Secondary hyperparathyroidism of renal origin: Secondary | ICD-10-CM | POA: Diagnosis not present

## 2017-09-22 DIAGNOSIS — N186 End stage renal disease: Secondary | ICD-10-CM | POA: Diagnosis not present

## 2017-09-24 DIAGNOSIS — N2581 Secondary hyperparathyroidism of renal origin: Secondary | ICD-10-CM | POA: Diagnosis not present

## 2017-09-24 DIAGNOSIS — D631 Anemia in chronic kidney disease: Secondary | ICD-10-CM | POA: Diagnosis not present

## 2017-09-24 DIAGNOSIS — E119 Type 2 diabetes mellitus without complications: Secondary | ICD-10-CM | POA: Diagnosis not present

## 2017-09-24 DIAGNOSIS — N186 End stage renal disease: Secondary | ICD-10-CM | POA: Diagnosis not present

## 2017-09-24 DIAGNOSIS — Z23 Encounter for immunization: Secondary | ICD-10-CM | POA: Diagnosis not present

## 2017-09-26 DIAGNOSIS — E1129 Type 2 diabetes mellitus with other diabetic kidney complication: Secondary | ICD-10-CM | POA: Diagnosis not present

## 2017-09-26 DIAGNOSIS — N186 End stage renal disease: Secondary | ICD-10-CM | POA: Diagnosis not present

## 2017-09-26 DIAGNOSIS — Z992 Dependence on renal dialysis: Secondary | ICD-10-CM | POA: Diagnosis not present

## 2017-09-27 DIAGNOSIS — E119 Type 2 diabetes mellitus without complications: Secondary | ICD-10-CM | POA: Diagnosis not present

## 2017-09-27 DIAGNOSIS — N186 End stage renal disease: Secondary | ICD-10-CM | POA: Diagnosis not present

## 2017-09-27 DIAGNOSIS — N2581 Secondary hyperparathyroidism of renal origin: Secondary | ICD-10-CM | POA: Diagnosis not present

## 2017-09-27 DIAGNOSIS — D509 Iron deficiency anemia, unspecified: Secondary | ICD-10-CM | POA: Diagnosis not present

## 2017-09-29 DIAGNOSIS — N186 End stage renal disease: Secondary | ICD-10-CM | POA: Diagnosis not present

## 2017-09-29 DIAGNOSIS — E119 Type 2 diabetes mellitus without complications: Secondary | ICD-10-CM | POA: Diagnosis not present

## 2017-09-29 DIAGNOSIS — N2581 Secondary hyperparathyroidism of renal origin: Secondary | ICD-10-CM | POA: Diagnosis not present

## 2017-09-29 DIAGNOSIS — D509 Iron deficiency anemia, unspecified: Secondary | ICD-10-CM | POA: Diagnosis not present

## 2017-10-01 DIAGNOSIS — D509 Iron deficiency anemia, unspecified: Secondary | ICD-10-CM | POA: Diagnosis not present

## 2017-10-01 DIAGNOSIS — E119 Type 2 diabetes mellitus without complications: Secondary | ICD-10-CM | POA: Diagnosis not present

## 2017-10-01 DIAGNOSIS — N2581 Secondary hyperparathyroidism of renal origin: Secondary | ICD-10-CM | POA: Diagnosis not present

## 2017-10-01 DIAGNOSIS — N186 End stage renal disease: Secondary | ICD-10-CM | POA: Diagnosis not present

## 2017-10-04 DIAGNOSIS — E119 Type 2 diabetes mellitus without complications: Secondary | ICD-10-CM | POA: Diagnosis not present

## 2017-10-04 DIAGNOSIS — N186 End stage renal disease: Secondary | ICD-10-CM | POA: Diagnosis not present

## 2017-10-04 DIAGNOSIS — D509 Iron deficiency anemia, unspecified: Secondary | ICD-10-CM | POA: Diagnosis not present

## 2017-10-04 DIAGNOSIS — N2581 Secondary hyperparathyroidism of renal origin: Secondary | ICD-10-CM | POA: Diagnosis not present

## 2017-10-06 DIAGNOSIS — D509 Iron deficiency anemia, unspecified: Secondary | ICD-10-CM | POA: Diagnosis not present

## 2017-10-06 DIAGNOSIS — N2581 Secondary hyperparathyroidism of renal origin: Secondary | ICD-10-CM | POA: Diagnosis not present

## 2017-10-06 DIAGNOSIS — E119 Type 2 diabetes mellitus without complications: Secondary | ICD-10-CM | POA: Diagnosis not present

## 2017-10-06 DIAGNOSIS — N186 End stage renal disease: Secondary | ICD-10-CM | POA: Diagnosis not present

## 2017-10-08 DIAGNOSIS — E119 Type 2 diabetes mellitus without complications: Secondary | ICD-10-CM | POA: Diagnosis not present

## 2017-10-08 DIAGNOSIS — N186 End stage renal disease: Secondary | ICD-10-CM | POA: Diagnosis not present

## 2017-10-08 DIAGNOSIS — N2581 Secondary hyperparathyroidism of renal origin: Secondary | ICD-10-CM | POA: Diagnosis not present

## 2017-10-08 DIAGNOSIS — D509 Iron deficiency anemia, unspecified: Secondary | ICD-10-CM | POA: Diagnosis not present

## 2017-10-11 DIAGNOSIS — N2581 Secondary hyperparathyroidism of renal origin: Secondary | ICD-10-CM | POA: Diagnosis not present

## 2017-10-11 DIAGNOSIS — N186 End stage renal disease: Secondary | ICD-10-CM | POA: Diagnosis not present

## 2017-10-11 DIAGNOSIS — D509 Iron deficiency anemia, unspecified: Secondary | ICD-10-CM | POA: Diagnosis not present

## 2017-10-11 DIAGNOSIS — E119 Type 2 diabetes mellitus without complications: Secondary | ICD-10-CM | POA: Diagnosis not present

## 2017-10-13 DIAGNOSIS — D509 Iron deficiency anemia, unspecified: Secondary | ICD-10-CM | POA: Diagnosis not present

## 2017-10-13 DIAGNOSIS — N2581 Secondary hyperparathyroidism of renal origin: Secondary | ICD-10-CM | POA: Diagnosis not present

## 2017-10-13 DIAGNOSIS — E119 Type 2 diabetes mellitus without complications: Secondary | ICD-10-CM | POA: Diagnosis not present

## 2017-10-13 DIAGNOSIS — N186 End stage renal disease: Secondary | ICD-10-CM | POA: Diagnosis not present

## 2017-10-15 DIAGNOSIS — N186 End stage renal disease: Secondary | ICD-10-CM | POA: Diagnosis not present

## 2017-10-15 DIAGNOSIS — D509 Iron deficiency anemia, unspecified: Secondary | ICD-10-CM | POA: Diagnosis not present

## 2017-10-15 DIAGNOSIS — N2581 Secondary hyperparathyroidism of renal origin: Secondary | ICD-10-CM | POA: Diagnosis not present

## 2017-10-15 DIAGNOSIS — E119 Type 2 diabetes mellitus without complications: Secondary | ICD-10-CM | POA: Diagnosis not present

## 2017-10-18 DIAGNOSIS — D509 Iron deficiency anemia, unspecified: Secondary | ICD-10-CM | POA: Diagnosis not present

## 2017-10-18 DIAGNOSIS — N2581 Secondary hyperparathyroidism of renal origin: Secondary | ICD-10-CM | POA: Diagnosis not present

## 2017-10-18 DIAGNOSIS — E119 Type 2 diabetes mellitus without complications: Secondary | ICD-10-CM | POA: Diagnosis not present

## 2017-10-18 DIAGNOSIS — N186 End stage renal disease: Secondary | ICD-10-CM | POA: Diagnosis not present

## 2017-10-20 DIAGNOSIS — N186 End stage renal disease: Secondary | ICD-10-CM | POA: Diagnosis not present

## 2017-10-20 DIAGNOSIS — N2581 Secondary hyperparathyroidism of renal origin: Secondary | ICD-10-CM | POA: Diagnosis not present

## 2017-10-20 DIAGNOSIS — E119 Type 2 diabetes mellitus without complications: Secondary | ICD-10-CM | POA: Diagnosis not present

## 2017-10-20 DIAGNOSIS — D509 Iron deficiency anemia, unspecified: Secondary | ICD-10-CM | POA: Diagnosis not present

## 2017-10-22 DIAGNOSIS — N2581 Secondary hyperparathyroidism of renal origin: Secondary | ICD-10-CM | POA: Diagnosis not present

## 2017-10-22 DIAGNOSIS — D509 Iron deficiency anemia, unspecified: Secondary | ICD-10-CM | POA: Diagnosis not present

## 2017-10-22 DIAGNOSIS — E119 Type 2 diabetes mellitus without complications: Secondary | ICD-10-CM | POA: Diagnosis not present

## 2017-10-22 DIAGNOSIS — N186 End stage renal disease: Secondary | ICD-10-CM | POA: Diagnosis not present

## 2017-10-25 DIAGNOSIS — N186 End stage renal disease: Secondary | ICD-10-CM | POA: Diagnosis not present

## 2017-10-25 DIAGNOSIS — E119 Type 2 diabetes mellitus without complications: Secondary | ICD-10-CM | POA: Diagnosis not present

## 2017-10-25 DIAGNOSIS — N2581 Secondary hyperparathyroidism of renal origin: Secondary | ICD-10-CM | POA: Diagnosis not present

## 2017-10-25 DIAGNOSIS — D509 Iron deficiency anemia, unspecified: Secondary | ICD-10-CM | POA: Diagnosis not present

## 2017-10-27 DIAGNOSIS — N186 End stage renal disease: Secondary | ICD-10-CM | POA: Diagnosis not present

## 2017-10-27 DIAGNOSIS — Z992 Dependence on renal dialysis: Secondary | ICD-10-CM | POA: Diagnosis not present

## 2017-10-27 DIAGNOSIS — E119 Type 2 diabetes mellitus without complications: Secondary | ICD-10-CM | POA: Diagnosis not present

## 2017-10-27 DIAGNOSIS — D509 Iron deficiency anemia, unspecified: Secondary | ICD-10-CM | POA: Diagnosis not present

## 2017-10-27 DIAGNOSIS — N2581 Secondary hyperparathyroidism of renal origin: Secondary | ICD-10-CM | POA: Diagnosis not present

## 2017-10-27 DIAGNOSIS — E1129 Type 2 diabetes mellitus with other diabetic kidney complication: Secondary | ICD-10-CM | POA: Diagnosis not present

## 2017-10-29 DIAGNOSIS — N186 End stage renal disease: Secondary | ICD-10-CM | POA: Diagnosis not present

## 2017-10-29 DIAGNOSIS — D631 Anemia in chronic kidney disease: Secondary | ICD-10-CM | POA: Diagnosis not present

## 2017-10-29 DIAGNOSIS — N2581 Secondary hyperparathyroidism of renal origin: Secondary | ICD-10-CM | POA: Diagnosis not present

## 2017-10-29 DIAGNOSIS — E119 Type 2 diabetes mellitus without complications: Secondary | ICD-10-CM | POA: Diagnosis not present

## 2017-10-29 DIAGNOSIS — D509 Iron deficiency anemia, unspecified: Secondary | ICD-10-CM | POA: Diagnosis not present

## 2017-11-01 DIAGNOSIS — N2581 Secondary hyperparathyroidism of renal origin: Secondary | ICD-10-CM | POA: Diagnosis not present

## 2017-11-01 DIAGNOSIS — E119 Type 2 diabetes mellitus without complications: Secondary | ICD-10-CM | POA: Diagnosis not present

## 2017-11-01 DIAGNOSIS — D631 Anemia in chronic kidney disease: Secondary | ICD-10-CM | POA: Diagnosis not present

## 2017-11-01 DIAGNOSIS — N186 End stage renal disease: Secondary | ICD-10-CM | POA: Diagnosis not present

## 2017-11-01 DIAGNOSIS — D509 Iron deficiency anemia, unspecified: Secondary | ICD-10-CM | POA: Diagnosis not present

## 2017-11-03 DIAGNOSIS — N2581 Secondary hyperparathyroidism of renal origin: Secondary | ICD-10-CM | POA: Diagnosis not present

## 2017-11-03 DIAGNOSIS — N186 End stage renal disease: Secondary | ICD-10-CM | POA: Diagnosis not present

## 2017-11-03 DIAGNOSIS — D631 Anemia in chronic kidney disease: Secondary | ICD-10-CM | POA: Diagnosis not present

## 2017-11-03 DIAGNOSIS — D509 Iron deficiency anemia, unspecified: Secondary | ICD-10-CM | POA: Diagnosis not present

## 2017-11-03 DIAGNOSIS — E119 Type 2 diabetes mellitus without complications: Secondary | ICD-10-CM | POA: Diagnosis not present

## 2017-11-05 DIAGNOSIS — N2581 Secondary hyperparathyroidism of renal origin: Secondary | ICD-10-CM | POA: Diagnosis not present

## 2017-11-05 DIAGNOSIS — D509 Iron deficiency anemia, unspecified: Secondary | ICD-10-CM | POA: Diagnosis not present

## 2017-11-05 DIAGNOSIS — N186 End stage renal disease: Secondary | ICD-10-CM | POA: Diagnosis not present

## 2017-11-05 DIAGNOSIS — E119 Type 2 diabetes mellitus without complications: Secondary | ICD-10-CM | POA: Diagnosis not present

## 2017-11-05 DIAGNOSIS — D631 Anemia in chronic kidney disease: Secondary | ICD-10-CM | POA: Diagnosis not present

## 2017-11-08 DIAGNOSIS — E119 Type 2 diabetes mellitus without complications: Secondary | ICD-10-CM | POA: Diagnosis not present

## 2017-11-08 DIAGNOSIS — N2581 Secondary hyperparathyroidism of renal origin: Secondary | ICD-10-CM | POA: Diagnosis not present

## 2017-11-08 DIAGNOSIS — N186 End stage renal disease: Secondary | ICD-10-CM | POA: Diagnosis not present

## 2017-11-08 DIAGNOSIS — D509 Iron deficiency anemia, unspecified: Secondary | ICD-10-CM | POA: Diagnosis not present

## 2017-11-08 DIAGNOSIS — D631 Anemia in chronic kidney disease: Secondary | ICD-10-CM | POA: Diagnosis not present

## 2017-11-10 DIAGNOSIS — N186 End stage renal disease: Secondary | ICD-10-CM | POA: Diagnosis not present

## 2017-11-10 DIAGNOSIS — N2581 Secondary hyperparathyroidism of renal origin: Secondary | ICD-10-CM | POA: Diagnosis not present

## 2017-11-10 DIAGNOSIS — D631 Anemia in chronic kidney disease: Secondary | ICD-10-CM | POA: Diagnosis not present

## 2017-11-10 DIAGNOSIS — D509 Iron deficiency anemia, unspecified: Secondary | ICD-10-CM | POA: Diagnosis not present

## 2017-11-10 DIAGNOSIS — E119 Type 2 diabetes mellitus without complications: Secondary | ICD-10-CM | POA: Diagnosis not present

## 2017-11-12 DIAGNOSIS — N2581 Secondary hyperparathyroidism of renal origin: Secondary | ICD-10-CM | POA: Diagnosis not present

## 2017-11-12 DIAGNOSIS — E119 Type 2 diabetes mellitus without complications: Secondary | ICD-10-CM | POA: Diagnosis not present

## 2017-11-12 DIAGNOSIS — D631 Anemia in chronic kidney disease: Secondary | ICD-10-CM | POA: Diagnosis not present

## 2017-11-12 DIAGNOSIS — N186 End stage renal disease: Secondary | ICD-10-CM | POA: Diagnosis not present

## 2017-11-12 DIAGNOSIS — D509 Iron deficiency anemia, unspecified: Secondary | ICD-10-CM | POA: Diagnosis not present

## 2017-11-14 DIAGNOSIS — D631 Anemia in chronic kidney disease: Secondary | ICD-10-CM | POA: Diagnosis not present

## 2017-11-14 DIAGNOSIS — N186 End stage renal disease: Secondary | ICD-10-CM | POA: Diagnosis not present

## 2017-11-14 DIAGNOSIS — D509 Iron deficiency anemia, unspecified: Secondary | ICD-10-CM | POA: Diagnosis not present

## 2017-11-14 DIAGNOSIS — E119 Type 2 diabetes mellitus without complications: Secondary | ICD-10-CM | POA: Diagnosis not present

## 2017-11-14 DIAGNOSIS — N2581 Secondary hyperparathyroidism of renal origin: Secondary | ICD-10-CM | POA: Diagnosis not present

## 2017-11-16 DIAGNOSIS — N2581 Secondary hyperparathyroidism of renal origin: Secondary | ICD-10-CM | POA: Diagnosis not present

## 2017-11-16 DIAGNOSIS — D509 Iron deficiency anemia, unspecified: Secondary | ICD-10-CM | POA: Diagnosis not present

## 2017-11-16 DIAGNOSIS — D631 Anemia in chronic kidney disease: Secondary | ICD-10-CM | POA: Diagnosis not present

## 2017-11-16 DIAGNOSIS — E119 Type 2 diabetes mellitus without complications: Secondary | ICD-10-CM | POA: Diagnosis not present

## 2017-11-16 DIAGNOSIS — N186 End stage renal disease: Secondary | ICD-10-CM | POA: Diagnosis not present

## 2017-11-19 DIAGNOSIS — N186 End stage renal disease: Secondary | ICD-10-CM | POA: Diagnosis not present

## 2017-11-19 DIAGNOSIS — D509 Iron deficiency anemia, unspecified: Secondary | ICD-10-CM | POA: Diagnosis not present

## 2017-11-19 DIAGNOSIS — D631 Anemia in chronic kidney disease: Secondary | ICD-10-CM | POA: Diagnosis not present

## 2017-11-19 DIAGNOSIS — N2581 Secondary hyperparathyroidism of renal origin: Secondary | ICD-10-CM | POA: Diagnosis not present

## 2017-11-19 DIAGNOSIS — E119 Type 2 diabetes mellitus without complications: Secondary | ICD-10-CM | POA: Diagnosis not present

## 2017-11-22 DIAGNOSIS — N2581 Secondary hyperparathyroidism of renal origin: Secondary | ICD-10-CM | POA: Diagnosis not present

## 2017-11-22 DIAGNOSIS — E119 Type 2 diabetes mellitus without complications: Secondary | ICD-10-CM | POA: Diagnosis not present

## 2017-11-22 DIAGNOSIS — D509 Iron deficiency anemia, unspecified: Secondary | ICD-10-CM | POA: Diagnosis not present

## 2017-11-22 DIAGNOSIS — N186 End stage renal disease: Secondary | ICD-10-CM | POA: Diagnosis not present

## 2017-11-22 DIAGNOSIS — D631 Anemia in chronic kidney disease: Secondary | ICD-10-CM | POA: Diagnosis not present

## 2017-11-24 DIAGNOSIS — N2581 Secondary hyperparathyroidism of renal origin: Secondary | ICD-10-CM | POA: Diagnosis not present

## 2017-11-24 DIAGNOSIS — D509 Iron deficiency anemia, unspecified: Secondary | ICD-10-CM | POA: Diagnosis not present

## 2017-11-24 DIAGNOSIS — D631 Anemia in chronic kidney disease: Secondary | ICD-10-CM | POA: Diagnosis not present

## 2017-11-24 DIAGNOSIS — E119 Type 2 diabetes mellitus without complications: Secondary | ICD-10-CM | POA: Diagnosis not present

## 2017-11-24 DIAGNOSIS — N186 End stage renal disease: Secondary | ICD-10-CM | POA: Diagnosis not present

## 2017-11-26 DIAGNOSIS — D509 Iron deficiency anemia, unspecified: Secondary | ICD-10-CM | POA: Diagnosis not present

## 2017-11-26 DIAGNOSIS — N2581 Secondary hyperparathyroidism of renal origin: Secondary | ICD-10-CM | POA: Diagnosis not present

## 2017-11-26 DIAGNOSIS — E1129 Type 2 diabetes mellitus with other diabetic kidney complication: Secondary | ICD-10-CM | POA: Diagnosis not present

## 2017-11-26 DIAGNOSIS — E119 Type 2 diabetes mellitus without complications: Secondary | ICD-10-CM | POA: Diagnosis not present

## 2017-11-26 DIAGNOSIS — Z992 Dependence on renal dialysis: Secondary | ICD-10-CM | POA: Diagnosis not present

## 2017-11-26 DIAGNOSIS — D631 Anemia in chronic kidney disease: Secondary | ICD-10-CM | POA: Diagnosis not present

## 2017-11-26 DIAGNOSIS — N186 End stage renal disease: Secondary | ICD-10-CM | POA: Diagnosis not present

## 2017-11-29 DIAGNOSIS — D509 Iron deficiency anemia, unspecified: Secondary | ICD-10-CM | POA: Diagnosis not present

## 2017-11-29 DIAGNOSIS — N186 End stage renal disease: Secondary | ICD-10-CM | POA: Diagnosis not present

## 2017-11-29 DIAGNOSIS — D631 Anemia in chronic kidney disease: Secondary | ICD-10-CM | POA: Diagnosis not present

## 2017-11-29 DIAGNOSIS — N2581 Secondary hyperparathyroidism of renal origin: Secondary | ICD-10-CM | POA: Diagnosis not present

## 2017-12-01 DIAGNOSIS — D509 Iron deficiency anemia, unspecified: Secondary | ICD-10-CM | POA: Diagnosis not present

## 2017-12-01 DIAGNOSIS — D631 Anemia in chronic kidney disease: Secondary | ICD-10-CM | POA: Diagnosis not present

## 2017-12-01 DIAGNOSIS — N2581 Secondary hyperparathyroidism of renal origin: Secondary | ICD-10-CM | POA: Diagnosis not present

## 2017-12-01 DIAGNOSIS — N186 End stage renal disease: Secondary | ICD-10-CM | POA: Diagnosis not present

## 2017-12-03 DIAGNOSIS — D509 Iron deficiency anemia, unspecified: Secondary | ICD-10-CM | POA: Diagnosis not present

## 2017-12-03 DIAGNOSIS — D631 Anemia in chronic kidney disease: Secondary | ICD-10-CM | POA: Diagnosis not present

## 2017-12-03 DIAGNOSIS — N2581 Secondary hyperparathyroidism of renal origin: Secondary | ICD-10-CM | POA: Diagnosis not present

## 2017-12-03 DIAGNOSIS — N186 End stage renal disease: Secondary | ICD-10-CM | POA: Diagnosis not present

## 2017-12-08 DIAGNOSIS — D631 Anemia in chronic kidney disease: Secondary | ICD-10-CM | POA: Diagnosis not present

## 2017-12-08 DIAGNOSIS — N186 End stage renal disease: Secondary | ICD-10-CM | POA: Diagnosis not present

## 2017-12-08 DIAGNOSIS — D509 Iron deficiency anemia, unspecified: Secondary | ICD-10-CM | POA: Diagnosis not present

## 2017-12-08 DIAGNOSIS — N2581 Secondary hyperparathyroidism of renal origin: Secondary | ICD-10-CM | POA: Diagnosis not present

## 2017-12-10 DIAGNOSIS — N2581 Secondary hyperparathyroidism of renal origin: Secondary | ICD-10-CM | POA: Diagnosis not present

## 2017-12-10 DIAGNOSIS — D631 Anemia in chronic kidney disease: Secondary | ICD-10-CM | POA: Diagnosis not present

## 2017-12-10 DIAGNOSIS — N186 End stage renal disease: Secondary | ICD-10-CM | POA: Diagnosis not present

## 2017-12-10 DIAGNOSIS — D509 Iron deficiency anemia, unspecified: Secondary | ICD-10-CM | POA: Diagnosis not present

## 2017-12-13 DIAGNOSIS — N186 End stage renal disease: Secondary | ICD-10-CM | POA: Diagnosis not present

## 2017-12-13 DIAGNOSIS — N2581 Secondary hyperparathyroidism of renal origin: Secondary | ICD-10-CM | POA: Diagnosis not present

## 2017-12-13 DIAGNOSIS — D631 Anemia in chronic kidney disease: Secondary | ICD-10-CM | POA: Diagnosis not present

## 2017-12-13 DIAGNOSIS — D509 Iron deficiency anemia, unspecified: Secondary | ICD-10-CM | POA: Diagnosis not present

## 2017-12-15 DIAGNOSIS — N186 End stage renal disease: Secondary | ICD-10-CM | POA: Diagnosis not present

## 2017-12-15 DIAGNOSIS — D631 Anemia in chronic kidney disease: Secondary | ICD-10-CM | POA: Diagnosis not present

## 2017-12-15 DIAGNOSIS — D509 Iron deficiency anemia, unspecified: Secondary | ICD-10-CM | POA: Diagnosis not present

## 2017-12-15 DIAGNOSIS — N2581 Secondary hyperparathyroidism of renal origin: Secondary | ICD-10-CM | POA: Diagnosis not present

## 2017-12-17 DIAGNOSIS — N2581 Secondary hyperparathyroidism of renal origin: Secondary | ICD-10-CM | POA: Diagnosis not present

## 2017-12-17 DIAGNOSIS — N186 End stage renal disease: Secondary | ICD-10-CM | POA: Diagnosis not present

## 2017-12-17 DIAGNOSIS — D631 Anemia in chronic kidney disease: Secondary | ICD-10-CM | POA: Diagnosis not present

## 2017-12-17 DIAGNOSIS — D509 Iron deficiency anemia, unspecified: Secondary | ICD-10-CM | POA: Diagnosis not present

## 2017-12-19 DIAGNOSIS — D631 Anemia in chronic kidney disease: Secondary | ICD-10-CM | POA: Diagnosis not present

## 2017-12-19 DIAGNOSIS — D509 Iron deficiency anemia, unspecified: Secondary | ICD-10-CM | POA: Diagnosis not present

## 2017-12-19 DIAGNOSIS — N2581 Secondary hyperparathyroidism of renal origin: Secondary | ICD-10-CM | POA: Diagnosis not present

## 2017-12-19 DIAGNOSIS — N186 End stage renal disease: Secondary | ICD-10-CM | POA: Diagnosis not present

## 2017-12-22 DIAGNOSIS — N2581 Secondary hyperparathyroidism of renal origin: Secondary | ICD-10-CM | POA: Diagnosis not present

## 2017-12-22 DIAGNOSIS — N186 End stage renal disease: Secondary | ICD-10-CM | POA: Diagnosis not present

## 2017-12-22 DIAGNOSIS — D509 Iron deficiency anemia, unspecified: Secondary | ICD-10-CM | POA: Diagnosis not present

## 2017-12-22 DIAGNOSIS — D631 Anemia in chronic kidney disease: Secondary | ICD-10-CM | POA: Diagnosis not present

## 2017-12-24 DIAGNOSIS — N2581 Secondary hyperparathyroidism of renal origin: Secondary | ICD-10-CM | POA: Diagnosis not present

## 2017-12-24 DIAGNOSIS — D509 Iron deficiency anemia, unspecified: Secondary | ICD-10-CM | POA: Diagnosis not present

## 2017-12-24 DIAGNOSIS — N186 End stage renal disease: Secondary | ICD-10-CM | POA: Diagnosis not present

## 2017-12-24 DIAGNOSIS — D631 Anemia in chronic kidney disease: Secondary | ICD-10-CM | POA: Diagnosis not present

## 2017-12-26 DIAGNOSIS — N2581 Secondary hyperparathyroidism of renal origin: Secondary | ICD-10-CM | POA: Diagnosis not present

## 2017-12-26 DIAGNOSIS — N186 End stage renal disease: Secondary | ICD-10-CM | POA: Diagnosis not present

## 2017-12-26 DIAGNOSIS — D509 Iron deficiency anemia, unspecified: Secondary | ICD-10-CM | POA: Diagnosis not present

## 2017-12-26 DIAGNOSIS — D631 Anemia in chronic kidney disease: Secondary | ICD-10-CM | POA: Diagnosis not present

## 2017-12-27 DIAGNOSIS — N186 End stage renal disease: Secondary | ICD-10-CM | POA: Diagnosis not present

## 2017-12-27 DIAGNOSIS — E1129 Type 2 diabetes mellitus with other diabetic kidney complication: Secondary | ICD-10-CM | POA: Diagnosis not present

## 2017-12-27 DIAGNOSIS — Z992 Dependence on renal dialysis: Secondary | ICD-10-CM | POA: Diagnosis not present

## 2018-02-26 ENCOUNTER — Other Ambulatory Visit: Payer: Self-pay | Admitting: Cardiology

## 2018-06-24 ENCOUNTER — Other Ambulatory Visit: Payer: Self-pay | Admitting: Cardiology

## 2018-06-27 ENCOUNTER — Telehealth: Payer: Self-pay | Admitting: Cardiology

## 2018-06-27 MED ORDER — METOPROLOL SUCCINATE ER 50 MG PO TB24: 50.0000 mg | ORAL_TABLET | Freq: Two times a day (BID) | ORAL | 0 refills | Status: DC

## 2018-06-27 NOTE — Telephone Encounter (Signed)
Updated Rx(s) sent to pharmacy electronically.

## 2018-06-27 NOTE — Telephone Encounter (Signed)
New Message:      Pt c/o medication issue:  1. Name of Medication: metoprolol succinate (TOPROL-XL) 50 MG 24 hr tablet  2. How are you currently taking this medication (dosage and times per day)? Take 1 tablet (50 mg total) by mouth daily. NEEDS APPOINTMENT FOR FUTURE REFILLS  3. Are you having a reaction (difficulty breathing--STAT)? No  4. What is your medication issue? Pharmacy is calling to clarify the dosage sent over for this pt.

## 2018-06-27 NOTE — Telephone Encounter (Signed)
Rx(s) sent to pharmacy electronically.  

## 2018-10-13 ENCOUNTER — Emergency Department (HOSPITAL_COMMUNITY)
Admission: EM | Admit: 2018-10-13 | Discharge: 2018-10-13 | Disposition: A | Discharge status: Home / Self Care | Payer: Medicare Other | Attending: Emergency Medicine | Admitting: Emergency Medicine

## 2018-10-13 ENCOUNTER — Emergency Department (HOSPITAL_COMMUNITY): Payer: Medicare Other

## 2018-10-13 ENCOUNTER — Other Ambulatory Visit: Payer: Self-pay

## 2018-10-13 ENCOUNTER — Encounter (HOSPITAL_COMMUNITY): Payer: Self-pay

## 2018-10-13 DIAGNOSIS — Z8673 Personal history of transient ischemic attack (TIA), and cerebral infarction without residual deficits: Secondary | ICD-10-CM | POA: Insufficient documentation

## 2018-10-13 DIAGNOSIS — E1122 Type 2 diabetes mellitus with diabetic chronic kidney disease: Secondary | ICD-10-CM | POA: Diagnosis not present

## 2018-10-13 DIAGNOSIS — Z7984 Long term (current) use of oral hypoglycemic drugs: Secondary | ICD-10-CM | POA: Diagnosis not present

## 2018-10-13 DIAGNOSIS — R296 Repeated falls: Secondary | ICD-10-CM | POA: Insufficient documentation

## 2018-10-13 DIAGNOSIS — I5032 Chronic diastolic (congestive) heart failure: Secondary | ICD-10-CM | POA: Insufficient documentation

## 2018-10-13 DIAGNOSIS — I132 Hypertensive heart and chronic kidney disease with heart failure and with stage 5 chronic kidney disease, or end stage renal disease: Secondary | ICD-10-CM | POA: Insufficient documentation

## 2018-10-13 DIAGNOSIS — Z7982 Long term (current) use of aspirin: Secondary | ICD-10-CM | POA: Diagnosis not present

## 2018-10-13 DIAGNOSIS — N186 End stage renal disease: Secondary | ICD-10-CM | POA: Insufficient documentation

## 2018-10-13 DIAGNOSIS — Z79899 Other long term (current) drug therapy: Secondary | ICD-10-CM | POA: Insufficient documentation

## 2018-10-13 DIAGNOSIS — Z87891 Personal history of nicotine dependence: Secondary | ICD-10-CM | POA: Insufficient documentation

## 2018-10-13 DIAGNOSIS — Z9049 Acquired absence of other specified parts of digestive tract: Secondary | ICD-10-CM | POA: Insufficient documentation

## 2018-10-13 LAB — BASIC METABOLIC PANEL
Anion gap: 12 (ref 5–15)
BUN: 27 mg/dL — ABNORMAL HIGH (ref 8–23)
CO2: 31 mmol/L (ref 22–32)
Calcium: 9.2 mg/dL (ref 8.9–10.3)
Chloride: 100 mmol/L (ref 98–111)
Creatinine, Ser: 5.1 mg/dL — ABNORMAL HIGH (ref 0.61–1.24)
GFR calc Af Amer: 11 mL/min — ABNORMAL LOW (ref >60)
GFR calc non Af Amer: 10 mL/min — ABNORMAL LOW (ref >60)
GLUCOSE: 87 mg/dL (ref 70–99)
POTASSIUM: 4 mmol/L (ref 3.5–5.1)
Sodium: 143 mmol/L (ref 135–145)

## 2018-10-13 LAB — CBC WITH DIFFERENTIAL/PLATELET
ABS IMMATURE GRANULOCYTES: 0.05 10*3/uL (ref 0.00–0.07)
Basophils Absolute: 0 10*3/uL (ref 0.0–0.1)
Basophils Relative: 0 %
Eosinophils Absolute: 0.3 10*3/uL (ref 0.0–0.5)
Eosinophils Relative: 4 %
HCT: 35.5 % — ABNORMAL LOW (ref 39.0–52.0)
Hemoglobin: 10.4 g/dL — ABNORMAL LOW (ref 13.0–17.0)
IMMATURE GRANULOCYTES: 1 %
LYMPHS ABS: 1.1 10*3/uL (ref 0.7–4.0)
Lymphocytes Relative: 15 %
MCH: 30.7 pg (ref 26.0–34.0)
MCHC: 29.3 g/dL — ABNORMAL LOW (ref 30.0–36.0)
MCV: 104.7 fL — AB (ref 80.0–100.0)
MONOS PCT: 11 %
Monocytes Absolute: 0.8 10*3/uL (ref 0.1–1.0)
NEUTROS PCT: 69 %
Neutro Abs: 5.1 10*3/uL (ref 1.7–7.7)
PLATELETS: 106 10*3/uL — AB (ref 150–400)
RBC: 3.39 MIL/uL — ABNORMAL LOW (ref 4.22–5.81)
RDW: 19.1 % — AB (ref 11.5–15.5)
WBC: 7.5 10*3/uL (ref 4.0–10.5)
nRBC: 0 % (ref 0.0–0.2)

## 2018-10-13 NOTE — ED Triage Notes (Addendum)
Pt BIB [facility] d/t falling x4 unwitness times w/AMS in the last 24 hrs at [facility] . Pt disoriented to Yr and situation per EMS. Pt AxO2 Pt HD MWF

## 2018-10-13 NOTE — ED Notes (Signed)
Pt transported back to [facility] via [facility].

## 2018-10-13 NOTE — Discharge Instructions (Addendum)
Your evaluated in the emergency department for frequent falls.  You did not have any complaints.  We did some routine testing including a CAT scan of your head at which showed you had an old stroke.  This will need to be followed up by your primary care doctor and may involve further testing.  We have contacted your daughter and also updated her on this plan.  Please return if any concerns.

## 2018-10-13 NOTE — ED Notes (Signed)
Patient transported to X-ray 

## 2018-10-13 NOTE — ED Provider Notes (Signed)
[facility] EMERGENCY DEPARTMENT Provider Note   CSN: [id number] Arrival date & time: 10/13/18  0700     History   Chief Complaint Chief Complaint  Patient presents with  . Fall  . Altered Mental Status    HPI [name] is a 77 y.o. male.  He was brought in by ambulance for frequent falls and possible confusion.  He is a resident of [facility].  When I asked him why he is here he says he did not know and that crazy nurse just likes to send people to the hospital.  He denies any complaints.  No headache no chest pain no shortness of breath no cough no abdominal pain no vomiting no diarrhea.  He is a dialysis patient Monday Wednesday Friday get dialyzed yesterday.  He says he does not make much urine.  He uses a cane inside his room and a walker when he walks outside.  He said the falls were very minor and he was not injured at all during the night.  He said 1 of the falls wasn't even a fall, he was on the floor trying to plug his phone under the bed and got stuck there and needed help getting up.  The history is provided by the patient and the EMS personnel.  Fall  This is a recurrent problem. The problem occurs every several days. The problem has been resolved. Pertinent negatives include no chest pain, no abdominal pain, no headaches and no shortness of breath. Nothing aggravates the symptoms. Nothing relieves the symptoms. He has tried nothing for the symptoms. The treatment provided no relief.    Past Medical History:  Diagnosis Date  . A-fib ([place])   . Anemia   . Blood transfusion   . BPH (benign prostatic hyperplasia)   . CHF (congestive heart failure) ([place])   . Chronic kidney disease   . CKD (chronic kidney disease)   . Diarrhea   . DM (diabetes mellitus) ([place])   . History of GI bleed    secondary to coumadin  . HTN (hypertension)   . Hyperlipidemia   . OSA (obstructive sleep apnea)    uses CPAP  . Secondary hyperparathyroidism of renal origin  [facility])     Patient Active Problem List   Diagnosis Date Noted  . Pulmonary HTN ([place]) 03/04/2017  . Aortic valve stenosis 03/04/2017  . Hypertension, accelerated with heart disease, without CHF 01/12/2017  . Dyspnea on exertion 01/12/2017  . End stage renal disease ([place]) 06/07/2012  . Hyperlipidemia 04/24/2011  . HYPERTENSION, BENIGN 10/24/2009  . DM 10/23/2009  . OBSTRUCTIVE SLEEP APNEA 10/23/2009  . PAF (paroxysmal atrial fibrillation) ([place]) 10/23/2009  . Hypertensive heart disease with chronic diastolic congestive heart failure ([place]) 10/23/2009    Past Surgical History:  Procedure Laterality Date  . BVT  2/29/79   Left  Basilic Vein Transposition  . CHOLECYSTECTOMY    . EYE SURGERY     Catarct bil  . INSERTION OF DIALYSIS CATHETER  05/28/2012   Procedure: INSERTION OF DIALYSIS CATHETER;  Surgeon: Mal Misty, MD;  Location: [facility];  Service: Vascular;  Laterality: Right;  . Left arm shuntogram.    . Left forearm loop graft with 6 mm Gore-Tex graft.    Purvis Sheffield plana vitrectomy with 25-gauge system          Home Medications    Prior to Admission medications   Medication Sig Start Date End Date Taking? Authorizing Provider  acetaminophen (TYLENOL) 325  MG tablet Take 650 mg by mouth every 4 (four) hours as needed. Headache or pain    [provider]  amLODipine (NORVASC) 5 MG tablet Take 1 tablet (5 mg total) by mouth daily. NEEDS APPOINTMENT FOR FUTURE REFILLS 06/27/18   Leonie Man, MD  aspirin EC 325 MG tablet Take 1 tablet (325 mg total) by mouth daily. 02/14/15   Nahser, Wonda Cheng, MD  atorvastatin (LIPITOR) 40 MG tablet take 1 tablet by mouth once daily 06/21/12   Larey Dresser, MD  B Complex-C-Folic Acid (NEPHRO-VITE RX PO) Take by mouth daily.      [provider]  calcium acetate (PHOSLO) 667 MG capsule Take 3 tablets by mouth 3 (three) times daily before meals.  02/03/11   [provider]  metoprolol succinate (TOPROL-XL) 50 MG 24 hr  tablet Take 1 tablet (50 mg total) by mouth 2 (two) times daily. NEEDS APPOINTMENT FOR FUTURE REFILLS 06/27/18   Leonie Man, MD  OVER THE COUNTER MEDICATION Take 1 tablet by mouth 2 (two) times daily as needed. Magnesium Salicylate Tetrahydrate (580 mg) [employer] brand back pain relief    [provider]  pregabalin (LYRICA) 50 MG capsule Take 50 mg by mouth at bedtime.    [provider]  repaglinide (PRANDIN) 0.5 MG tablet Take 0.5 mg by mouth 3 (three) times daily before meals.    [provider]  zolpidem (AMBIEN) 5 MG tablet Take 5 mg by mouth at bedtime.    [provider]    Family History Family History  Problem Relation Age of Onset  . Alzheimer's disease Mother   . Diabetes Father        Amputation:  bilateral legs  . Cancer Daughter        breast cancer  . Diabetes Son   . Heart disease Son        before age 29  . Hypertension Son   . Anesthesia problems Neg Hx     Social History Social History   Tobacco Use  . Smoking status: Former Smoker    Types: Cigarettes    Last attempt to quit: 06/30/2004    Years since quitting: 14.2  . Smokeless tobacco: Never Used  Substance Use Topics  . Alcohol use: No  . Drug use: No     Allergies   Tape   Review of Systems Review of Systems  Constitutional: Negative for fever.  HENT: Negative for sore throat.   Eyes: Negative for pain.  Respiratory: Negative for shortness of breath.   Cardiovascular: Negative for chest pain.  Gastrointestinal: Negative for abdominal pain.  Genitourinary: Negative for testicular pain.  Musculoskeletal: Negative for neck pain.  Skin: Negative for rash.  Neurological: Negative for headaches.     Physical Exam Updated Vital Signs BP 126/63   Pulse 94   Resp 19   SpO2 (!) 89%   Physical Exam  Constitutional: He appears well-developed and well-nourished.  HENT:  Head: Normocephalic and atraumatic.  Eyes: Conjunctivae are normal.  Neck: Neck  supple.  Cardiovascular: Normal rate. An irregularly irregular rhythm present.  Murmur heard.  Systolic murmur is present. Pulmonary/Chest: Effort normal and breath sounds normal. No respiratory distress.  Abdominal: Soft. There is no tenderness.  Musculoskeletal: Normal range of motion. He exhibits no edema or deformity.  Fistula left upper arm with positive thrill.  Neurological: He is alert. He has normal strength. He is disoriented (To time. knew season and president). No cranial  nerve deficit or sensory deficit. GCS eye subscore is 4. GCS verbal subscore is 5. GCS motor subscore is 6.  Skin: Skin is warm and dry.  Psychiatric: He has a normal mood and affect.  Nursing note and vitals reviewed.    ED Treatments / Results  Labs (all labs ordered are listed, but only abnormal results are displayed) Labs Reviewed  BASIC METABOLIC PANEL - Abnormal; Notable for the following components:      Result Value   BUN 27 (*)    Creatinine, Ser 5.10 (*)    GFR calc non Af Amer 10 (*)    GFR calc Af Amer 11 (*)    All other components within normal limits  CBC WITH DIFFERENTIAL/PLATELET - Abnormal; Notable for the following components:   RBC 3.39 (*)    Hemoglobin 10.4 (*)    HCT 35.5 (*)    MCV 104.7 (*)    MCHC 29.3 (*)    RDW 19.1 (*)    Platelets 106 (*)    All other components within normal limits    EKG EKG Interpretation  Date/Time:  Thursday October 13 2018 07:04:59 EDT Ventricular Rate:  106 PR Interval:    QRS Duration: 91 QT Interval:  347 QTC Calculation: 468 R Axis:   89 Text Interpretation:  Atrial flutter/fibrillation Anterior infarct, old Nonspecific T abnormalities, lateral leads similar to prior except lower voltage ( 6/13) Confirmed by Aletta Edouard [phone]) on 10/13/2018 7:20:20 AM   Radiology Dg Chest 2 View  Result Date: 10/13/2018 CLINICAL DATA:  Fall. EXAM: CHEST - 2 VIEW COMPARISON:  June 16, 2012 FINDINGS: Mild cardiomegaly. The hila and  mediastinum are unchanged since 2013. No nodules or masses. No focal infiltrates. Mild pulmonary venous congestion not excluded. No overt edema. IMPRESSION: Cardiomegaly. Possible mild pulmonary venous congestion without overt edema. Recommend clinical correlation. Electronically Signed   By: Dorise Bullion III M.D   On: 10/13/2018 08:02   Ct Head Wo Contrast  Result Date: 10/13/2018 CLINICAL DATA:  Fall.  Altered mental status. EXAM: CT HEAD WITHOUT CONTRAST TECHNIQUE: Contiguous axial images were obtained from the base of the skull through the vertex without intravenous contrast. COMPARISON:  01/02/2009 FINDINGS: Brain: Left posterior parietal infarct, age indeterminate. There is atrophy and chronic small vessel disease changes. No hydrocephalus or hemorrhage. Vascular: No hyperdense vessel or unexpected calcification. Skull: No acute calvarial abnormality. Sinuses/Orbits: Mucosal thickening in the paranasal sinuses. No air-fluid levels. Mastoids clear. Other: None IMPRESSION: Age-indeterminate left posterior parietal infarct. Atrophy, chronic small vessel disease. Electronically Signed   By: Rolm Baptise M.D.   On: 10/13/2018 08:17    Procedures Procedures (including critical care time)  Medications Ordered in ED Medications - No data to display   Initial Impression / Assessment and Plan / ED Course  I have reviewed the triage vital signs and the nursing notes.  Pertinent labs & imaging results that were available during my care of the patient were reviewed by me and considered in my medical decision making (see chart for details).  Clinical Course as of Oct 14 1206  Thu Oct 13, 2018  0830 Patient CT showed a remote left parietal infarct.  I reviewed this with him and he states he has not noticed any deficits and did not know about this information.   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

## 2018-10-20 ENCOUNTER — Emergency Department (HOSPITAL_COMMUNITY): Payer: Medicare Other

## 2018-10-20 ENCOUNTER — Inpatient Hospital Stay (HOSPITAL_COMMUNITY)
Admission: EM | Admit: 2018-10-20 | Discharge: 2018-11-02 | DRG: 871 | Disposition: A | Discharge status: Skilled Nursing Facility | Payer: Medicare Other | Attending: Internal Medicine | Admitting: Internal Medicine

## 2018-10-20 ENCOUNTER — Other Ambulatory Visit: Payer: Self-pay

## 2018-10-20 ENCOUNTER — Encounter (HOSPITAL_COMMUNITY): Payer: Self-pay

## 2018-10-20 DIAGNOSIS — R112 Nausea with vomiting, unspecified: Secondary | ICD-10-CM | POA: Diagnosis present

## 2018-10-20 DIAGNOSIS — S91109A Unspecified open wound of unspecified toe(s) without damage to nail, initial encounter: Secondary | ICD-10-CM

## 2018-10-20 DIAGNOSIS — Z8249 Family history of ischemic heart disease and other diseases of the circulatory system: Secondary | ICD-10-CM

## 2018-10-20 DIAGNOSIS — I480 Paroxysmal atrial fibrillation: Secondary | ICD-10-CM | POA: Diagnosis present

## 2018-10-20 DIAGNOSIS — G9340 Encephalopathy, unspecified: Secondary | ICD-10-CM

## 2018-10-20 DIAGNOSIS — I132 Hypertensive heart and chronic kidney disease with heart failure and with stage 5 chronic kidney disease, or end stage renal disease: Secondary | ICD-10-CM | POA: Diagnosis present

## 2018-10-20 DIAGNOSIS — Z79899 Other long term (current) drug therapy: Secondary | ICD-10-CM

## 2018-10-20 DIAGNOSIS — N2581 Secondary hyperparathyroidism of renal origin: Secondary | ICD-10-CM | POA: Diagnosis present

## 2018-10-20 DIAGNOSIS — I350 Nonrheumatic aortic (valve) stenosis: Secondary | ICD-10-CM | POA: Diagnosis present

## 2018-10-20 DIAGNOSIS — D631 Anemia in chronic kidney disease: Secondary | ICD-10-CM | POA: Diagnosis present

## 2018-10-20 DIAGNOSIS — A419 Sepsis, unspecified organism: Secondary | ICD-10-CM | POA: Diagnosis not present

## 2018-10-20 DIAGNOSIS — Z833 Family history of diabetes mellitus: Secondary | ICD-10-CM

## 2018-10-20 DIAGNOSIS — Z992 Dependence on renal dialysis: Secondary | ICD-10-CM

## 2018-10-20 DIAGNOSIS — J9601 Acute respiratory failure with hypoxia: Secondary | ICD-10-CM | POA: Diagnosis present

## 2018-10-20 DIAGNOSIS — E785 Hyperlipidemia, unspecified: Secondary | ICD-10-CM | POA: Diagnosis present

## 2018-10-20 DIAGNOSIS — E1122 Type 2 diabetes mellitus with diabetic chronic kidney disease: Secondary | ICD-10-CM

## 2018-10-20 DIAGNOSIS — R109 Unspecified abdominal pain: Secondary | ICD-10-CM

## 2018-10-20 DIAGNOSIS — J90 Pleural effusion, not elsewhere classified: Secondary | ICD-10-CM | POA: Diagnosis present

## 2018-10-20 DIAGNOSIS — E1151 Type 2 diabetes mellitus with diabetic peripheral angiopathy without gangrene: Secondary | ICD-10-CM | POA: Diagnosis present

## 2018-10-20 DIAGNOSIS — R748 Abnormal levels of other serum enzymes: Secondary | ICD-10-CM

## 2018-10-20 DIAGNOSIS — Z9049 Acquired absence of other specified parts of digestive tract: Secondary | ICD-10-CM

## 2018-10-20 DIAGNOSIS — I5032 Chronic diastolic (congestive) heart failure: Secondary | ICD-10-CM | POA: Diagnosis present

## 2018-10-20 DIAGNOSIS — N186 End stage renal disease: Secondary | ICD-10-CM | POA: Diagnosis present

## 2018-10-20 DIAGNOSIS — R4182 Altered mental status, unspecified: Secondary | ICD-10-CM | POA: Diagnosis not present

## 2018-10-20 DIAGNOSIS — J189 Pneumonia, unspecified organism: Secondary | ICD-10-CM | POA: Diagnosis present

## 2018-10-20 DIAGNOSIS — G4733 Obstructive sleep apnea (adult) (pediatric): Secondary | ICD-10-CM | POA: Diagnosis present

## 2018-10-20 DIAGNOSIS — I509 Heart failure, unspecified: Secondary | ICD-10-CM

## 2018-10-20 DIAGNOSIS — R791 Abnormal coagulation profile: Secondary | ICD-10-CM | POA: Diagnosis present

## 2018-10-20 DIAGNOSIS — Z87891 Personal history of nicotine dependence: Secondary | ICD-10-CM

## 2018-10-20 DIAGNOSIS — F0390 Unspecified dementia without behavioral disturbance: Secondary | ICD-10-CM | POA: Diagnosis present

## 2018-10-20 DIAGNOSIS — D696 Thrombocytopenia, unspecified: Secondary | ICD-10-CM

## 2018-10-20 DIAGNOSIS — I120 Hypertensive chronic kidney disease with stage 5 chronic kidney disease or end stage renal disease: Secondary | ICD-10-CM

## 2018-10-20 DIAGNOSIS — D649 Anemia, unspecified: Secondary | ICD-10-CM

## 2018-10-20 DIAGNOSIS — G9341 Metabolic encephalopathy: Secondary | ICD-10-CM | POA: Diagnosis present

## 2018-10-20 DIAGNOSIS — N400 Enlarged prostate without lower urinary tract symptoms: Secondary | ICD-10-CM | POA: Diagnosis present

## 2018-10-20 DIAGNOSIS — Z7982 Long term (current) use of aspirin: Secondary | ICD-10-CM

## 2018-10-20 DIAGNOSIS — R0600 Dyspnea, unspecified: Secondary | ICD-10-CM

## 2018-10-20 DIAGNOSIS — I959 Hypotension, unspecified: Secondary | ICD-10-CM

## 2018-10-20 LAB — COMPREHENSIVE METABOLIC PANEL
ALK PHOS: 224 U/L — AB (ref 38–126)
ALT: 24 U/L (ref 0–44)
AST: 28 U/L (ref 15–41)
Albumin: 3.2 g/dL — ABNORMAL LOW (ref 3.5–5.0)
Anion gap: 16 — ABNORMAL HIGH (ref 5–15)
BUN: 37 mg/dL — AB (ref 8–23)
CALCIUM: 9.5 mg/dL (ref 8.9–10.3)
CO2: 26 mmol/L (ref 22–32)
Chloride: 101 mmol/L (ref 98–111)
Creatinine, Ser: 5.1 mg/dL — ABNORMAL HIGH (ref 0.61–1.24)
GFR, EST AFRICAN AMERICAN: 11 mL/min — AB (ref >60)
GFR, EST NON AFRICAN AMERICAN: 10 mL/min — AB (ref >60)
Glucose, Bld: 75 mg/dL (ref 70–99)
Potassium: 3.5 mmol/L (ref 3.5–5.1)
Sodium: 143 mmol/L (ref 135–145)
Total Bilirubin: 1.2 mg/dL (ref 0.3–1.2)
Total Protein: 6.7 g/dL (ref 6.5–8.1)

## 2018-10-20 LAB — CBC WITH DIFFERENTIAL/PLATELET
ABS IMMATURE GRANULOCYTES: 0.06 10*3/uL (ref 0.00–0.07)
BASOS ABS: 0 10*3/uL (ref 0.0–0.1)
Basophils Relative: 1 %
Eosinophils Absolute: 0.3 10*3/uL (ref 0.0–0.5)
Eosinophils Relative: 4 %
HCT: 30 % — ABNORMAL LOW (ref 39.0–52.0)
Hemoglobin: 9.1 g/dL — ABNORMAL LOW (ref 13.0–17.0)
Immature Granulocytes: 1 %
Lymphocytes Relative: 17 %
Lymphs Abs: 1.1 10*3/uL (ref 0.7–4.0)
MCH: 32.3 pg (ref 26.0–34.0)
MCHC: 30.3 g/dL (ref 30.0–36.0)
MCV: 106.4 fL — ABNORMAL HIGH (ref 80.0–100.0)
MONO ABS: 1 10*3/uL (ref 0.1–1.0)
Monocytes Relative: 14 %
Neutro Abs: 4.3 10*3/uL (ref 1.7–7.7)
Neutrophils Relative %: 63 %
Platelets: 140 10*3/uL — ABNORMAL LOW (ref 150–400)
RBC: 2.82 MIL/uL — AB (ref 4.22–5.81)
RDW: 18.4 % — ABNORMAL HIGH (ref 11.5–15.5)
WBC: 6.8 10*3/uL (ref 4.0–10.5)
nRBC: 1.2 % — ABNORMAL HIGH (ref 0.0–0.2)

## 2018-10-20 LAB — I-STAT VENOUS BLOOD GAS, ED
Acid-Base Excess: 9 mmol/L — ABNORMAL HIGH (ref 0.0–2.0)
Bicarbonate: 33.2 mmol/L — ABNORMAL HIGH (ref 20.0–28.0)
O2 Saturation: 79 %
PCO2 VEN: 42 mmHg — AB (ref 44.0–60.0)
PO2 VEN: 40 mmHg (ref 32.0–45.0)
TCO2: 34 mmol/L — AB (ref 22–32)
pH, Ven: 7.506 — ABNORMAL HIGH (ref 7.250–7.430)

## 2018-10-20 LAB — I-STAT CG4 LACTIC ACID, ED
Lactic Acid, Venous: 3.3 mmol/L (ref 0.5–1.9)
Lactic Acid, Venous: 2.5 mmol/L (ref 0.5–1.9)

## 2018-10-20 MED ORDER — VANCOMYCIN HCL IN DEXTROSE 1-5 GM/200ML-% IV SOLN: 1000.0000 mg | Freq: Once | INTRAVENOUS | Status: DC

## 2018-10-20 MED ORDER — VANCOMYCIN HCL 10 G IV SOLR
1500.0000 mg | Freq: Once | INTRAVENOUS | Status: AC
  Administered 2018-10-20: 1500 mg via INTRAVENOUS
  Filled 2018-10-20: qty 1500

## 2018-10-20 MED ORDER — VANCOMYCIN HCL IN DEXTROSE 750-5 MG/150ML-% IV SOLN
750.0000 mg | INTRAVENOUS | Status: DC
  Filled 2018-10-20: qty 150

## 2018-10-20 MED ORDER — METRONIDAZOLE IN NACL 5-0.79 MG/ML-% IV SOLN
500.0000 mg | Freq: Three times a day (TID) | INTRAVENOUS | Status: DC
  Administered 2018-10-20 – 2018-10-22 (×6): 500 mg via INTRAVENOUS
  Filled 2018-10-20 (×7): qty 100

## 2018-10-20 MED ORDER — SODIUM CHLORIDE 0.9 % IV SOLN
2.0000 g | Freq: Once | INTRAVENOUS | Status: AC
  Administered 2018-10-20: 2 g via INTRAVENOUS
  Filled 2018-10-20: qty 2

## 2018-10-20 MED ORDER — SODIUM CHLORIDE 0.9 % IV SOLN
2.0000 g | INTRAVENOUS | Status: DC
  Administered 2018-10-21: 2 g via INTRAVENOUS
  Filled 2018-10-20 (×2): qty 2

## 2018-10-20 MED ORDER — SODIUM CHLORIDE 0.9 % IV BOLUS
500.0000 mL | Freq: Once | INTRAVENOUS | Status: AC
  Administered 2018-10-20: 500 mL via INTRAVENOUS

## 2018-10-20 NOTE — ED Notes (Signed)
Informed provider that pt's son in law is requesting an update

## 2018-10-20 NOTE — ED Notes (Signed)
Bladder scanned Pt. 53mL

## 2018-10-20 NOTE — ED Triage Notes (Signed)
Pt brought in by EMS due to being altered. Pt was found by facility slumped over in his wheelchair. On arrival pts BP was 60/40. Pt is HD on MWF.

## 2018-10-20 NOTE — ED Provider Notes (Signed)
[facility] EMERGENCY DEPARTMENT Provider Note   CSN: [id number] Arrival date & time: 10/20/18  1606     History   Chief Complaint Chief Complaint  Patient presents with  . Altered Mental Status    HPI [name] is a 77 y.o. male.  77yo M w/ PMH including ESRD on HD M/W/F, HTN, CHF, A fib, OSA, T2DM who p/w AMS.  She was brought in today by EMS who report that he was altered, found that his facility slumped over in his wheelchair.  On their arrival his BP was 60/40.  He received a normal dialysis session yesterday.  For me he states he has had some diarrhea, occasionally black episodes but not persistent.  Mild cough.  He denies any chest pain or abdominal pain.  He denies shortness of breath although he is dyspneic on exam.  Of note, he was brought in to ED last week for frequent falls and being not quite his usual self.  LEVEL 5 CAVEAT DUE TO AMS  The history is provided by the patient and the EMS personnel.  Altered Mental Status      Past Medical History:  Diagnosis Date  . A-fib ([place])   . Anemia   . Blood transfusion   . BPH (benign prostatic hyperplasia)   . CHF (congestive heart failure) ([place])   . Chronic kidney disease   . CKD (chronic kidney disease)   . Diarrhea   . DM (diabetes mellitus) ([place])   . History of GI bleed    secondary to coumadin  . HTN (hypertension)   . Hyperlipidemia   . OSA (obstructive sleep apnea)    uses CPAP  . Secondary hyperparathyroidism of renal origin [facility])     Patient Active Problem List   Diagnosis Date Noted  . Pulmonary HTN ([place]) 03/04/2017  . Aortic valve stenosis 03/04/2017  . Hypertension, accelerated with heart disease, without CHF 01/12/2017  . Dyspnea on exertion 01/12/2017  . End stage renal disease ([place]) 06/07/2012  . Hyperlipidemia 04/24/2011  . HYPERTENSION, BENIGN 10/24/2009  . DM 10/23/2009  . OBSTRUCTIVE SLEEP APNEA 10/23/2009  . PAF (paroxysmal atrial fibrillation) ([place])  10/23/2009  . Hypertensive heart disease with chronic diastolic congestive heart failure ([place]) 10/23/2009    Past Surgical History:  Procedure Laterality Date  . BVT  5/62/13   Left  Basilic Vein Transposition  . CHOLECYSTECTOMY    . EYE SURGERY     Catarct bil  . INSERTION OF DIALYSIS CATHETER  05/28/2012   Procedure: INSERTION OF DIALYSIS CATHETER;  Surgeon: Mal Misty, MD;  Location: [facility];  Service: Vascular;  Laterality: Right;  . Left arm shuntogram.    . Left forearm loop graft with 6 mm Gore-Tex graft.    Purvis [place] plana vitrectomy with 25-gauge system          Home Medications    Prior to Admission medications   Medication Sig Start Date End Date Taking? Authorizing Provider  acetaminophen (TYLENOL) 325 MG tablet Take 650 mg by mouth every 4 (four) hours as needed. Headache or pain    [provider]  amLODipine (NORVASC) 5 MG tablet Take 1 tablet (5 mg total) by mouth daily. NEEDS APPOINTMENT FOR FUTURE REFILLS 06/27/18   Leonie Man, MD  aspirin EC 325 MG tablet Take 1 tablet (325 mg total) by mouth daily. 02/14/15   Nahser, Wonda Cheng, MD  atorvastatin (LIPITOR) 40 MG tablet take 1 tablet by mouth once daily  06/21/12   Larey Dresser, MD  B Complex-C-Folic Acid (NEPHRO-VITE RX PO) Take by mouth daily.      [provider]  calcium acetate (PHOSLO) 667 MG capsule Take 3 tablets by mouth 3 (three) times daily before meals.  02/03/11   [provider]  metoprolol succinate (TOPROL-XL) 50 MG 24 hr tablet Take 1 tablet (50 mg total) by mouth 2 (two) times daily. NEEDS APPOINTMENT FOR FUTURE REFILLS 06/27/18   Leonie Man, MD  OVER THE COUNTER MEDICATION Take 1 tablet by mouth 2 (two) times daily as needed. Magnesium Salicylate Tetrahydrate (580 mg) [employer] brand back pain relief    [provider]  pregabalin (LYRICA) 50 MG capsule Take 50 mg by mouth at bedtime.    [provider]  repaglinide (PRANDIN) 0.5 MG tablet Take 0.5  mg by mouth 3 (three) times daily before meals.    [provider]  zolpidem (AMBIEN) 5 MG tablet Take 5 mg by mouth at bedtime.    [provider]    Family History Family History  Problem Relation Age of Onset  . Alzheimer's disease Mother   . Diabetes Father        Amputation:  bilateral legs  . Cancer Daughter        breast cancer  . Diabetes Son   . Heart disease Son        before age 40  . Hypertension Son   . Anesthesia problems Neg Hx     Social History Social History   Tobacco Use  . Smoking status: Former Smoker    Types: Cigarettes    Last attempt to quit: 06/30/2004    Years since quitting: 14.3  . Smokeless tobacco: Never Used  Substance Use Topics  . Alcohol use: No  . Drug use: No     Allergies   Tape   Review of Systems Review of Systems  Unable to perform ROS: Mental status change     Physical Exam Updated Vital Signs BP (!) 100/58   Pulse 79   Temp 98.2 F (36.8 C) (Oral)   Resp (!) 28   Ht 5\' 9"  (1.753 m)   Wt 74.4 kg   SpO2 99%   BMI 24.22 kg/m   Physical Exam  Constitutional: He appears well-developed and well-nourished.  Sleepy but awake, dyspneic, chronically ill appearing but non-toxic  HENT:  Head: Normocephalic and atraumatic.  Eyes: Pupils are equal, round, and reactive to light. Conjunctivae are normal.  Pinpoint pupils  Neck: Neck supple.  Cardiovascular: Normal rate and regular rhythm.  Murmur heard. Pulmonary/Chest:  Dyspneic but speaking in full sentences, mildly increased work of breathing, diminished BS b/l  Abdominal: Soft. Bowel sounds are normal. He exhibits no distension. There is no tenderness.  Musculoskeletal: He exhibits edema (mild BLE).  Neurological: He is alert.  Mild confusion but able to answer questions, no facial asymmetry, fluent speech  Skin: Skin is warm and dry.  Nursing note and vitals reviewed.    ED Treatments / Results  Labs (all labs ordered are listed, but only  abnormal results are displayed) Labs Reviewed  I-STAT CG4 LACTIC ACID, ED - Abnormal; Notable for the following components:      Result Value   Lactic Acid, Venous 3.30 (*)    All other components within normal limits  CULTURE, BLOOD (ROUTINE X 2)  CULTURE, BLOOD (ROUTINE X 2)  COMPREHENSIVE METABOLIC PANEL  CBC WITH DIFFERENTIAL/PLATELET  URINALYSIS, ROUTINE W REFLEX  MICROSCOPIC  I-STAT VENOUS BLOOD GAS, ED    EKG EKG Interpretation  Date/Time:  Thursday October 20 2018 16:12:35 EDT Ventricular Rate:  79 PR Interval:    QRS Duration: 74 QT Interval:  387 QTC Calculation: 444 R Axis:   171 Text Interpretation:  Atrial fibrillation Low voltage, precordial leads Right ventricular hypertrophy Probable lateral infarct, age indeterminate T wave inversions precordial leads new from previous Confirmed by [name] [phone]) on 10/20/2018 4:20:13 PM   Radiology No results found.  Procedures .Critical Care Performed by: Sharlett Iles, MD Authorized by: Sharlett Iles, MD   Critical care provider statement:    Critical care time (minutes):  40   Critical care time was exclusive of:  Separately billable procedures and treating other patients   Critical care was necessary to treat or prevent imminent or life-threatening deterioration of the following conditions:  Sepsis   Critical care was time spent personally by me on the following activities:  Development of treatment plan with patient or surrogate, evaluation of patient's response to treatment, examination of patient, obtaining history from patient or surrogate, ordering and performing treatments and interventions, ordering and review of laboratory studies, ordering and review of radiographic studies and re-evaluation of patient's condition   (including critical care time)  Medications Ordered in ED Medications  sodium chloride 0.9 % bolus 500 mL (has no administration in time range)  ceFEPIme (MAXIPIME) 2 g in  sodium chloride 0.9 % 100 mL IVPB (has no administration in time range)  metroNIDAZOLE (FLAGYL) IVPB 500 mg (has no administration in time range)  vancomycin (VANCOCIN) IVPB 1000 mg/200 mL premix (has no administration in time range)     Initial Impression / Assessment and Plan / ED Course  I have reviewed the triage vital signs and the nursing notes.  Pertinent labs & imaging results that were available during my care of the patient were reviewed by me and considered in my medical decision making (see chart for details).    Pt mildly confused but able to answer basic questions, dyspneic but without respiratory distress. BP 95/63, HR 80s, T 98.2. Lactate elevated at 3, because of hypotension and AMS, initiated code sepsis w/ blood and urine cultures, broad abx, IVF.   Lactate 3.3, stable anemia with hemoglobin 9, potassium 3.5, chest x-ray with stable social prominence.  Patient has continued to require a small amount of supplemental oxygen, it is unclear how often he wears oxygen at baseline.  Although he appears dyspneic, he states that his breathing is better on reassessment.  Head CT negative acute.  Because of altered mental status and lactic acidosis, discussed admission with Dr. Marlowe Sax and pt admitted for further care.  Final Clinical Impressions(s) / ED Diagnoses   Final diagnoses:  None    ED Discharge Orders    None       Little, Wenda Overland, MD 10/21/18 [phone]

## 2018-10-20 NOTE — ED Notes (Signed)
Pt has a bm movement but was able to walk to the bathroom w/ assistance.  Unable to get urine.  Pt was short of breath upon return, 85% on room air.  Placed back on O2.  Updated Provider, will continue to monitor.

## 2018-10-20 NOTE — ED Notes (Signed)
Daughter - [name] [phone] house,    [phone])

## 2018-10-20 NOTE — ED Notes (Signed)
Pt. made aware we need a urine specimen. Pt. stated "I can't go at this time."

## 2018-10-20 NOTE — ED Notes (Signed)
ED Provider at bedside. 

## 2018-10-20 NOTE — Progress Notes (Signed)
Pharmacy Antibiotic Note  [name] is a 77 y.o. male admitted on 10/20/2018 with sepsis.  PMH is significant for ESRD on HD MWF with last HD yesterday. Pharmacy has been consulted for Vancomycin and Cefepime dosing.   Plan: Cefepime 2g x1, then Cefepime 2g qHD Vancomycin 1500mg  x1, then 750mg  qHD Flagyl 500mg  q8h per MD Monitor HD schedule Monitor and adjust per renal plans, clinical status, C&S, and vanc troughs as needed  Height: 5\' 9"  (175.3 cm) Weight: 164 lb 0.4 oz (74.4 kg) IBW/kg (Calculated) : 70.7  Temp (24hrs), Avg:98.2 F (36.8 C), Min:98.2 F (36.8 C), Max:98.2 F (36.8 C)  Recent Labs  Lab 10/20/18 1630  LATICACIDVEN 3.30*    Estimated Creatinine Clearance: 12.1 mL/min (A) (by C-G formula based on SCr of 5.1 mg/dL (H)).    Allergies  Allergen Reactions  . Tape Itching    Cloth tape only    Antimicrobials this admission: Vancomycin 10/24 >>  Cefepime 10/24 >>  Flagyl 10/24 >>  Dose adjustments this admission: N/A  Microbiology results: Pending  Thank you for allowing pharmacy to be a part of this patient's care.  Tyson Babinski 10/20/2018 4:50 PM

## 2018-10-21 ENCOUNTER — Inpatient Hospital Stay (HOSPITAL_COMMUNITY): Payer: Medicare Other

## 2018-10-21 ENCOUNTER — Other Ambulatory Visit: Payer: Self-pay

## 2018-10-21 DIAGNOSIS — Z79899 Other long term (current) drug therapy: Secondary | ICD-10-CM | POA: Diagnosis not present

## 2018-10-21 DIAGNOSIS — Z833 Family history of diabetes mellitus: Secondary | ICD-10-CM | POA: Diagnosis not present

## 2018-10-21 DIAGNOSIS — D696 Thrombocytopenia, unspecified: Secondary | ICD-10-CM | POA: Diagnosis present

## 2018-10-21 DIAGNOSIS — R748 Abnormal levels of other serum enzymes: Secondary | ICD-10-CM

## 2018-10-21 DIAGNOSIS — J90 Pleural effusion, not elsewhere classified: Secondary | ICD-10-CM | POA: Diagnosis present

## 2018-10-21 DIAGNOSIS — R4182 Altered mental status, unspecified: Secondary | ICD-10-CM | POA: Diagnosis present

## 2018-10-21 DIAGNOSIS — J189 Pneumonia, unspecified organism: Secondary | ICD-10-CM | POA: Diagnosis present

## 2018-10-21 DIAGNOSIS — N186 End stage renal disease: Secondary | ICD-10-CM | POA: Diagnosis present

## 2018-10-21 DIAGNOSIS — I5032 Chronic diastolic (congestive) heart failure: Secondary | ICD-10-CM | POA: Diagnosis present

## 2018-10-21 DIAGNOSIS — Z7982 Long term (current) use of aspirin: Secondary | ICD-10-CM | POA: Diagnosis not present

## 2018-10-21 DIAGNOSIS — G4733 Obstructive sleep apnea (adult) (pediatric): Secondary | ICD-10-CM | POA: Diagnosis present

## 2018-10-21 DIAGNOSIS — M899 Disorder of bone, unspecified: Secondary | ICD-10-CM | POA: Diagnosis not present

## 2018-10-21 DIAGNOSIS — G9340 Encephalopathy, unspecified: Secondary | ICD-10-CM

## 2018-10-21 DIAGNOSIS — I350 Nonrheumatic aortic (valve) stenosis: Secondary | ICD-10-CM | POA: Diagnosis present

## 2018-10-21 DIAGNOSIS — D649 Anemia, unspecified: Secondary | ICD-10-CM

## 2018-10-21 DIAGNOSIS — A419 Sepsis, unspecified organism: Secondary | ICD-10-CM | POA: Diagnosis present

## 2018-10-21 DIAGNOSIS — J9601 Acute respiratory failure with hypoxia: Secondary | ICD-10-CM | POA: Diagnosis present

## 2018-10-21 DIAGNOSIS — N2581 Secondary hyperparathyroidism of renal origin: Secondary | ICD-10-CM | POA: Diagnosis present

## 2018-10-21 DIAGNOSIS — I480 Paroxysmal atrial fibrillation: Secondary | ICD-10-CM | POA: Diagnosis present

## 2018-10-21 DIAGNOSIS — E1151 Type 2 diabetes mellitus with diabetic peripheral angiopathy without gangrene: Secondary | ICD-10-CM | POA: Diagnosis present

## 2018-10-21 DIAGNOSIS — R0600 Dyspnea, unspecified: Secondary | ICD-10-CM | POA: Diagnosis not present

## 2018-10-21 DIAGNOSIS — D631 Anemia in chronic kidney disease: Secondary | ICD-10-CM | POA: Diagnosis present

## 2018-10-21 DIAGNOSIS — Z87891 Personal history of nicotine dependence: Secondary | ICD-10-CM | POA: Diagnosis not present

## 2018-10-21 DIAGNOSIS — E785 Hyperlipidemia, unspecified: Secondary | ICD-10-CM | POA: Diagnosis present

## 2018-10-21 DIAGNOSIS — G9341 Metabolic encephalopathy: Secondary | ICD-10-CM | POA: Diagnosis present

## 2018-10-21 DIAGNOSIS — Z8249 Family history of ischemic heart disease and other diseases of the circulatory system: Secondary | ICD-10-CM | POA: Diagnosis not present

## 2018-10-21 DIAGNOSIS — N400 Enlarged prostate without lower urinary tract symptoms: Secondary | ICD-10-CM | POA: Diagnosis present

## 2018-10-21 DIAGNOSIS — E1122 Type 2 diabetes mellitus with diabetic chronic kidney disease: Secondary | ICD-10-CM | POA: Diagnosis present

## 2018-10-21 DIAGNOSIS — Z992 Dependence on renal dialysis: Secondary | ICD-10-CM | POA: Diagnosis not present

## 2018-10-21 DIAGNOSIS — I132 Hypertensive heart and chronic kidney disease with heart failure and with stage 5 chronic kidney disease, or end stage renal disease: Secondary | ICD-10-CM | POA: Diagnosis present

## 2018-10-21 LAB — LIPASE, BLOOD: LIPASE: 35 U/L (ref 11–51)

## 2018-10-21 LAB — RENAL FUNCTION PANEL
Albumin: 3.2 g/dL — ABNORMAL LOW (ref 3.5–5.0)
Anion gap: 16 — ABNORMAL HIGH (ref 5–15)
BUN: 42 mg/dL — AB (ref 8–23)
CO2: 28 mmol/L (ref 22–32)
CREATININE: 5.78 mg/dL — AB (ref 0.61–1.24)
Calcium: 9.6 mg/dL (ref 8.9–10.3)
Chloride: 101 mmol/L (ref 98–111)
GFR calc Af Amer: 10 mL/min — ABNORMAL LOW (ref >60)
GFR calc non Af Amer: 8 mL/min — ABNORMAL LOW (ref >60)
Glucose, Bld: 74 mg/dL (ref 70–99)
POTASSIUM: 3.6 mmol/L (ref 3.5–5.1)
Phosphorus: 4.2 mg/dL (ref 2.5–4.6)
Sodium: 145 mmol/L (ref 135–145)

## 2018-10-21 LAB — GLUCOSE, CAPILLARY
GLUCOSE-CAPILLARY: 115 mg/dL — AB (ref 70–99)
Glucose-Capillary: 82 mg/dL (ref 70–99)
Glucose-Capillary: 83 mg/dL (ref 70–99)
Glucose-Capillary: 84 mg/dL (ref 70–99)
Glucose-Capillary: 189 mg/dL — ABNORMAL HIGH (ref 70–99)

## 2018-10-21 LAB — CBC
HEMATOCRIT: 31.5 % — AB (ref 39.0–52.0)
Hemoglobin: 9.3 g/dL — ABNORMAL LOW (ref 13.0–17.0)
MCH: 30.9 pg (ref 26.0–34.0)
MCHC: 29.5 g/dL — ABNORMAL LOW (ref 30.0–36.0)
MCV: 104.7 fL — ABNORMAL HIGH (ref 80.0–100.0)
Platelets: 143 10*3/uL — ABNORMAL LOW (ref 150–400)
RBC: 3.01 MIL/uL — ABNORMAL LOW (ref 4.22–5.81)
RDW: 18.4 % — AB (ref 11.5–15.5)
WBC: 8.6 10*3/uL (ref 4.0–10.5)
nRBC: 0.5 % — ABNORMAL HIGH (ref 0.0–0.2)

## 2018-10-21 LAB — VITAMIN B12: Vitamin B-12: 1046 pg/mL — ABNORMAL HIGH (ref 180–914)

## 2018-10-21 LAB — MRSA PCR SCREENING: MRSA by PCR: NEGATIVE

## 2018-10-21 LAB — HEPATIC FUNCTION PANEL
ALK PHOS: 205 U/L — AB (ref 38–126)
ALT: 24 U/L (ref 0–44)
AST: 25 U/L (ref 15–41)
Albumin: 3 g/dL — ABNORMAL LOW (ref 3.5–5.0)
BILIRUBIN INDIRECT: 0.8 mg/dL (ref 0.3–0.9)
Bilirubin, Direct: 0.4 mg/dL — ABNORMAL HIGH (ref 0.0–0.2)
Total Bilirubin: 1.2 mg/dL (ref 0.3–1.2)
Total Protein: 6.6 g/dL (ref 6.5–8.1)

## 2018-10-21 LAB — LACTIC ACID, PLASMA
Lactic Acid, Venous: 1.9 mmol/L (ref 0.5–1.9)
Lactic Acid, Venous: 1.5 mmol/L (ref 0.5–1.9)

## 2018-10-21 LAB — PROCALCITONIN: Procalcitonin: 0.96 ng/mL

## 2018-10-21 LAB — AMMONIA: Ammonia: 75 umol/L — ABNORMAL HIGH (ref 9–35)

## 2018-10-21 LAB — HEMOGLOBIN A1C
Hgb A1c MFr Bld: 4.2 % — ABNORMAL LOW (ref 4.8–5.6)
Mean Plasma Glucose: 73.84 mg/dL

## 2018-10-21 LAB — HEPATITIS B SURFACE ANTIGEN: Hepatitis B Surface Ag: NEGATIVE

## 2018-10-21 MED ORDER — SODIUM CHLORIDE 0.9 % IV SOLN: 100.0000 mL | INTRAVENOUS | Status: DC | PRN

## 2018-10-21 MED ORDER — SODIUM CHLORIDE 0.9 % IV SOLN
INTRAVENOUS | Status: DC | PRN
  Administered 2018-10-21 – 2018-10-22 (×3): 250 mL via INTRAVENOUS

## 2018-10-21 MED ORDER — IPRATROPIUM-ALBUTEROL 0.5-2.5 (3) MG/3ML IN SOLN
3.0000 mL | Freq: Four times a day (QID) | RESPIRATORY_TRACT | Status: DC | PRN
  Administered 2018-10-21: 3 mL via RESPIRATORY_TRACT
  Filled 2018-10-21: qty 3

## 2018-10-21 MED ORDER — HEPARIN SODIUM (PORCINE) 5000 UNIT/ML IJ SOLN
5000.0000 [IU] | Freq: Three times a day (TID) | INTRAMUSCULAR | Status: DC
  Administered 2018-10-21 – 2018-10-22 (×4): 5000 [IU] via SUBCUTANEOUS
  Filled 2018-10-21 (×4): qty 1

## 2018-10-21 MED ORDER — LIDOCAINE-PRILOCAINE 2.5-2.5 % EX CREA: 1.0000 "application " | TOPICAL_CREAM | CUTANEOUS | Status: DC | PRN

## 2018-10-21 MED ORDER — CALCIUM ACETATE (PHOS BINDER) 667 MG PO CAPS
2668.0000 mg | ORAL_CAPSULE | Freq: Three times a day (TID) | ORAL | Status: DC
  Administered 2018-10-21 – 2018-10-30 (×21): 2668 mg via ORAL
  Filled 2018-10-21 (×25): qty 4

## 2018-10-21 MED ORDER — HEPARIN SODIUM (PORCINE) 1000 UNIT/ML IJ SOLN
INTRAMUSCULAR | Status: AC
  Filled 2018-10-21: qty 3

## 2018-10-21 MED ORDER — ASPIRIN EC 325 MG PO TBEC
325.0000 mg | DELAYED_RELEASE_TABLET | Freq: Every day | ORAL | Status: DC
  Administered 2018-10-22 – 2018-11-02 (×11): 325 mg via ORAL
  Filled 2018-10-21 (×12): qty 1

## 2018-10-21 MED ORDER — HEPARIN SODIUM (PORCINE) 1000 UNIT/ML DIALYSIS
2500.0000 [IU] | INTRAMUSCULAR | Status: DC | PRN
  Administered 2018-10-21: 2500 [IU] via INTRAVENOUS_CENTRAL

## 2018-10-21 MED ORDER — ACETAMINOPHEN 325 MG PO TABS
650.0000 mg | ORAL_TABLET | Freq: Four times a day (QID) | ORAL | Status: DC | PRN
  Administered 2018-10-30: 650 mg via ORAL
  Filled 2018-10-21 (×2): qty 2

## 2018-10-21 MED ORDER — LACTULOSE 10 GM/15ML PO SOLN
20.0000 g | Freq: Three times a day (TID) | ORAL | Status: DC
  Administered 2018-10-21 – 2018-10-22 (×2): 20 g via ORAL
  Filled 2018-10-21 (×2): qty 30

## 2018-10-21 MED ORDER — HEPARIN SODIUM (PORCINE) 1000 UNIT/ML DIALYSIS: 1000.0000 [IU] | INTRAMUSCULAR | Status: DC | PRN

## 2018-10-21 MED ORDER — HYDROXYZINE HCL 25 MG PO TABS
25.0000 mg | ORAL_TABLET | Freq: Three times a day (TID) | ORAL | Status: DC | PRN
  Administered 2018-10-30 – 2018-11-02 (×3): 25 mg via ORAL
  Filled 2018-10-21 (×3): qty 1

## 2018-10-21 MED ORDER — METOPROLOL SUCCINATE ER 100 MG PO TB24
100.0000 mg | ORAL_TABLET | Freq: Every day | ORAL | Status: DC
  Administered 2018-10-21 – 2018-10-24 (×4): 100 mg via ORAL
  Filled 2018-10-21 (×4): qty 1

## 2018-10-21 MED ORDER — LIDOCAINE HCL (PF) 1 % IJ SOLN: 5.0000 mL | INTRAMUSCULAR | Status: DC | PRN

## 2018-10-21 MED ORDER — PENTAFLUOROPROP-TETRAFLUOROETH EX AERO: 1.0000 "application " | INHALATION_SPRAY | CUTANEOUS | Status: DC | PRN

## 2018-10-21 MED ORDER — CHLORHEXIDINE GLUCONATE CLOTH 2 % EX PADS: 6.0000 | MEDICATED_PAD | Freq: Every day | CUTANEOUS | Status: DC

## 2018-10-21 MED ORDER — INSULIN ASPART 100 UNIT/ML ~~LOC~~ SOLN: 0.0000 [IU] | Freq: Three times a day (TID) | SUBCUTANEOUS | Status: DC

## 2018-10-21 NOTE — Progress Notes (Signed)
CSW noted consult that patient from [place]. Left message for patient's daughter, [name]. Awaiting call back to complete assessment. CSW to follow and support.  Estanislado Emms, [place]

## 2018-10-21 NOTE — Consult Note (Addendum)
[facility] Renal Consultation Note    Indication for Consultation:  Management of ESRD/hemodialysis; anemia, hypertension/volume and secondary hyperparathyroidism  KKX:[id number], No Pcp Per  HPI: [name] is a 77 y.o. male. ESRD on HD MWF at [facility], first starting in 10/2008.  Past medical history significant for DMT2, HTN, diastolic dysfunction, OSA, CHF, A fib, BPH, Hx GIB 2/2 coumadin, aortic stenosis and dementia.  Of note per extender at OP dialysis center, patient dementia has been steadily worsening over the last few months.   Patient presented today via EMS from his facility due to [place].  Of note he was seen in the ED last week due to AMS and frequent falls.  Pertinant findings include hypotension, tachycardia, tachypnea, lactic acid of 2.5, ammonia 75, CXR and CT head with no acute findings.  Patient has been admitted for further evaluation and management.  Seen and examined at bedside while on dialysis.  Remains confused, but answering basic questions.  Reports back pain as his biggest concern.  Denies SOB, CP, abdominal pain and n/v/d.     Past Medical History:  Diagnosis Date  . A-fib ([place])   . Anemia   . Blood transfusion   . BPH (benign prostatic hyperplasia)   . CHF (congestive heart failure) ([place])   . Chronic kidney disease   . CKD (chronic kidney disease)   . Diarrhea   . DM (diabetes mellitus) ([place])   . History of GI bleed    secondary to coumadin  . HTN (hypertension)   . Hyperlipidemia   . OSA (obstructive sleep apnea)    uses CPAP  . Secondary hyperparathyroidism of renal origin [facility])    Past Surgical History:  Procedure Laterality Date  . BVT  3/71/69   Left  Basilic Vein Transposition  . CHOLECYSTECTOMY    . EYE SURGERY     Catarct bil  . INSERTION OF DIALYSIS CATHETER  05/28/2012   Procedure: INSERTION OF DIALYSIS CATHETER;  Surgeon: Mal Misty, MD;  Location: [facility];  Service: Vascular;  Laterality: Right;  . Left arm  shuntogram.    . Left forearm loop graft with 6 mm Gore-Tex graft.    . Pars plana vitrectomy with 25-gauge system     Family History  Problem Relation Age of Onset  . Alzheimer's disease Mother   . Diabetes Father        Amputation:  bilateral legs  . Cancer Daughter        breast cancer  . Diabetes Son   . Heart disease Son        before age 36  . Hypertension Son   . Anesthesia problems Neg Hx    Social History:  reports that he quit smoking about 14 years ago. His smoking use included cigarettes. He has never used smokeless tobacco. He reports that he does not drink alcohol or use drugs. Allergies  Allergen Reactions  . Tape Itching and Other (See Comments)    Cloth tape only   Prior to Admission medications   Medication Sig Start Date End Date Taking? Authorizing Provider  acetaminophen (TYLENOL) 500 MG tablet Take 1,000 mg by mouth 3 (three) times daily.   Yes [provider]  aspirin EC 325 MG tablet Take 1 tablet (325 mg total) by mouth daily. 02/14/15  Yes Nahser, Wonda Cheng, MD  calcium acetate (PHOSLO) 667 MG capsule Take 2,668 mg by mouth 3 (three) times daily with meals.  02/03/11  Yes [provider]  hydrOXYzine (ATARAX/VISTARIL) 25 MG tablet Take 25 mg by mouth every 8 (eight) hours as needed for itching.   Yes [provider]  losartan (COZAAR) 100 MG tablet Take 100 mg by mouth daily.   Yes [provider]  metoprolol succinate (TOPROL-XL) 100 MG 24 hr tablet Take 100 mg by mouth daily. Take with or immediately following a meal.   Yes [provider]  repaglinide (PRANDIN) 0.5 MG tablet Take 0.5 mg by mouth 3 (three) times daily.    Yes [provider]  amLODipine (NORVASC) 5 MG tablet Take 1 tablet (5 mg total) by mouth daily. NEEDS APPOINTMENT FOR FUTURE REFILLS Patient not taking: Reported on 10/20/2018 06/27/18   Leonie Man, MD  atorvastatin (LIPITOR) 40 MG tablet take 1 tablet by mouth once daily Patient  not taking: Reported on 10/20/2018 06/21/12   Larey Dresser, MD  metoprolol succinate (TOPROL-XL) 50 MG 24 hr tablet Take 1 tablet (50 mg total) by mouth 2 (two) times daily. NEEDS APPOINTMENT FOR FUTURE REFILLS Patient not taking: Reported on 10/20/2018 06/27/18   Leonie Man, MD   Current Facility-Administered Medications  Medication Dose Route Frequency Provider Last Rate Last Dose  . 0.9 %  sodium chloride infusion  100 mL Intravenous PRN Penninger, Ria Comment, PA      . 0.9 %  sodium chloride infusion  100 mL Intravenous PRN Penninger, Ria Comment, PA      . 0.9 %  sodium chloride infusion   Intravenous PRN Elodia Florence., MD   Stopped at 10/21/18 1205  . acetaminophen (TYLENOL) tablet 650 mg  650 mg Oral Q6H PRN Shela Leff, MD      . aspirin EC tablet 325 mg  325 mg Oral Daily Shela Leff, MD      . calcium acetate (PHOSLO) capsule 2,668 mg  2,668 mg Oral TID WC Shela Leff, MD      . ceFEPIme (MAXIPIME) 2 g in sodium chloride 0.9 % 100 mL IVPB  2 g Intravenous Q M,W,F-HD Shela Leff, MD      . Chlorhexidine Gluconate Cloth 2 % PADS 6 each  6 each Topical Q0600 Penninger, Lindsay, PA      . heparin injection 1,000 Units  1,000 Units Dialysis PRN Penninger, Ria Comment, PA      . heparin injection 2,500 Units  2,500 Units Dialysis PRN Penninger, Ria Comment, PA   2,500 Units at 10/21/18 1339  . heparin injection 5,000 Units  5,000 Units Subcutaneous Q8H Shela Leff, MD   5,000 Units at 10/21/18 0604  . hydrOXYzine (ATARAX/VISTARIL) tablet 25 mg  25 mg Oral Q8H PRN Shela Leff, MD      . insulin aspart (novoLOG) injection 0-9 Units  0-9 Units Subcutaneous TID WC Shela Leff, MD      . ipratropium-albuterol (DUONEB) 0.5-2.5 (3) MG/3ML nebulizer solution 3 mL  3 mL Nebulization Q6H PRN Shela Leff, MD   3 mL at 10/21/18 0803  . lidocaine (PF) (XYLOCAINE) 1 % injection 5 mL  5 mL Intradermal PRN Penninger, Ria Comment, PA      .  lidocaine-prilocaine (EMLA) cream 1 application  1 application Topical PRN Penninger, Ria Comment, PA      . metoprolol succinate (TOPROL-XL) 24 hr tablet 100 mg  100 mg Oral Daily Shela Leff, MD      . metroNIDAZOLE (FLAGYL) IVPB 500 mg  500 mg Intravenous Quay Burow, MD   Stopped at 10/21/18 1205  . pentafluoroprop-tetrafluoroeth (GEBAUERS) aerosol 1 application  1 application Topical PRN Penninger, Ria Comment, [place]       Labs: Basic Metabolic Panel: Recent Labs  Lab 10/20/18 1645 10/21/18 0520  NA 143 145  K 3.5 3.6  CL 101 101  CO2 26 28  GLUCOSE 75 74  BUN 37* 42*  CREATININE 5.10* 5.78*  CALCIUM 9.5 9.6  PHOS  --  4.2   Liver Function Tests: Recent Labs  Lab 10/20/18 1645 10/21/18 0520 10/21/18 1200  AST 28  --  25  ALT 24  --  24  ALKPHOS 224*  --  205*  BILITOT 1.2  --  1.2  PROT 6.7  --  6.6  ALBUMIN 3.2* 3.2* 3.0*   Recent Labs  Lab 10/21/18 0520  LIPASE 35   Recent Labs  Lab 10/21/18 1200  AMMONIA 75*   CBC: Recent Labs  Lab 10/20/18 1645 10/21/18 0520  WBC 6.8 8.6  NEUTROABS 4.3  --   HGB 9.1* 9.3*  HCT 30.0* 31.5*  MCV 106.4* 104.7*  PLT 140* 143*   CBG: Recent Labs  Lab 10/21/18 0820 10/21/18 1101  GLUCAP 82 115*    Studies/Results: Ct Head Wo Contrast  Result Date: 10/20/2018 CLINICAL DATA:  Altered LOC EXAM: CT HEAD WITHOUT CONTRAST TECHNIQUE: Contiguous axial images were obtained from the base of the skull through the vertex without intravenous contrast. COMPARISON:  10/13/2018 FINDINGS: Brain: No acute hemorrhage or intracranial mass. 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 Old lacunar infarct left basal ganglia. Moderate-to-marked atrophy. Moderate small vessel ischemic changes of the white matter. Stable enlarged ventricles. Vascular: No hyperdense vessels.  Carotid vascular calcification Skull: Normal. Negative for fracture or focal lesion. Sinuses/Orbits: Mild mucosal thickening in the sinuses. No acute orbital  abnormality. Other: None IMPRESSION: 1. No CT evidence for acute intracranial abnormality. 2. Atrophy and small-vessel ischemic changes of the white matter. 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 Electronically Signed   By: Donavan Foil M.D.   On: 10/20/2018 18:49   Dg Chest Port 1 View  Result Date: 10/20/2018 CLINICAL DATA:  Altered mental status and hypotension. EXAM: PORTABLE CHEST 1 VIEW COMPARISON:  10/13/2018 and prior radiographs FINDINGS: UPPER limits normal heart size noted. Mild interstitial prominence again noted. No airspace disease, pleural effusion or pneumothorax. No acute bony abnormality identified. IMPRESSION: UPPER limits normal heart size with unchanged interstitial prominence. Electronically Signed   By: Margarette Canada M.D.   On: 10/20/2018 16:42    ROS: All others negative except those listed in HPI.  Physical Exam: Vitals:   10/21/18 1330 10/21/18 1400 10/21/18 1430 10/21/18 1500  BP: 103/61 118/68 122/69 130/72  Pulse: (!) 108 (!) 105 (!) 102 (!) 101  Resp:      Temp:      TempSrc:      SpO2:      Weight:      Height:         General: NAD, chronically ill appearing male, restless in bed Head: NCAT sclera not icteric MMM Neck: Supple. No lymphadenopathy Lungs: CTA bilaterally. No wheeze, rales or rhonchi. Breathing is unlabored with 2L O2. Heart: +tachycardia, irregular rhythm. +8/7 systolic murmur, no rubs or gallops.  Abdomen: soft, nontender, +BS, no guarding, no rebound tenderness  Lower extremities:no edema, ischemic changes, or open wounds  Neuro: Alert. Moves all extremities spontaneously. Psych:  Responds to questions appropriately with a normal affect. Dialysis Access:  Dialysis Orders:  MWF- AF  4hrs, BFR 400, DFR AF 1.5,  EDW 73kg, 2K/ 2.25Ca, Linear Na  Access:  LU AVF  Heparin 2500 unit bolus Mircera 100 mcg q2wks - last 10/21 Hectorol 47mcg IV qHD   Venofer 50mg  IV qwk  Assessment/Plan: 1.  AMS - BC pending. Lactic acid improved to 1.5.  Ammonia elevated, 75.  Negative CT head and CXR.  Abdominal US ordered. On ABX. Per primary. 2.  ESRD -  On HD MWF. Orders written for HD today per regular schedule.  3.  Hypertension/volume  - BP improved. 5L over EDW, which he was meeting last week. Plan for HD today with UF goal 3L.    4.  Anemia of CKD - Hgb 9.3 - recently dosed esa. 5.  Secondary Hyperparathyroidism -  Ca and phos at goal. Continue VDRA and binders. 6.  Nutrition - Renal diet with fluid restrictions. Nepro.  7.  DMT2 - per primary 8. Diastolic dysfunction/aortic stenosis 9. OSA 10. A fib - not on anticoagulation 11. Hx GIB 2/2 coumadin    Jen Mow, PA-C [facility] Pager: [phone] 10/21/2018, 3:10 PM   I have seen and examined this patient and agree with plan and assessment in the above note with renal recommendations/intervention highlighted.  Pt is markedly different from baseline.  Elevated NH3 and blood cultures pending.  Agree with empiric abx.  Consider lactulose.  Follow after HD to see if his mentation improves.  Governor Rooks Omeka Holben,MD 10/21/2018 3:59 PM

## 2018-10-21 NOTE — Progress Notes (Addendum)
Back from abdominal US. No urine produced. Not able to send urine to lab  [employer], RN

## 2018-10-21 NOTE — Progress Notes (Signed)
Contacted ultrasound department since patient has order for abdominal US and per nigh shift RN patient need to be NPO.Per ultrasound department patient can have breakfast and they will do it in the afternoon, patient need to be NPO for 5 hrs. Had breakfast at 9 AM.  Venetia Night, RN
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
## 2018-10-21 NOTE — Progress Notes (Signed)
Patient back from HD. Will leave for abdominal ultrasound now.  Elliott Quade, RN

## 2018-10-21 NOTE — Progress Notes (Signed)
Patient talkative and alert and oriented x 4, after breakfast confused and sleepy. Informed MD Florene Glen. Patient denies pain, states he only want to sleep. New lab work placed per MD. Report given to HD.  Fed Ceci, RN

## 2018-10-21 NOTE — Progress Notes (Signed)
Called daughter back because she wanted update. No response, left voice mail.

## 2018-10-21 NOTE — Procedures (Signed)
I was present at this dialysis session. I have reviewed the session itself and made appropriate changes.   Vital signs in last 24 hours:  Temp:  [97.5 F (36.4 C)-99.1 F (37.3 C)] 97.5 F (36.4 C) (10/25 1240) Pulse Rate:  [51-108] 102 (10/25 1430) Resp:  [11-38] 22 (10/25 1255) BP: (90-141)/(55-88) 122/69 (10/25 1430) SpO2:  [70 %-100 %] 99 % (10/25 1240) Weight:  [74.4 kg-78.5 kg] 78.5 kg (10/25 1240) Weight change:  Filed Weights   10/21/18 0025 10/21/18 0037 10/21/18 1240  Weight: 77.6 kg 77.7 kg 78.5 kg    Recent Labs  Lab 10/21/18 0520  NA 145  K 3.6  CL 101  CO2 28  GLUCOSE 74  BUN 42*  CREATININE 5.78*  CALCIUM 9.6  PHOS 4.2    Recent Labs  Lab 10/20/18 1645 10/21/18 0520  WBC 6.8 8.6  NEUTROABS 4.3  --   HGB 9.1* 9.3*  HCT 30.0* 31.5*  MCV 106.4* 104.7*  PLT 140* 143*    Scheduled Meds: . aspirin EC  325 mg Oral Daily  . calcium acetate  2,668 mg Oral TID WC  . Chlorhexidine Gluconate Cloth  6 each Topical Q0600  . heparin  5,000 Units Subcutaneous Q8H  . insulin aspart  0-9 Units Subcutaneous TID WC  . metoprolol succinate  100 mg Oral Daily   Continuous Infusions: . sodium chloride    . sodium chloride    . sodium chloride Stopped (10/21/18 1205)  . ceFEPime (MAXIPIME) IV    . metronidazole Stopped (10/21/18 1205)   PRN Meds:.sodium chloride, sodium chloride, sodium chloride, acetaminophen, heparin, heparin, hydrOXYzine, ipratropium-albuterol, lidocaine (PF), lidocaine-prilocaine, pentafluoroprop-tetrafluoroeth   Donetta Potts,  MD 10/21/2018, 2:51 PM

## 2018-10-21 NOTE — Progress Notes (Signed)
Upper airway wheezing during the shift change. Paged RT for treatment.

## 2018-10-21 NOTE — Plan of Care (Signed)
  Problem: Skin Integrity: Goal: Risk for impaired skin integrity will decrease Outcome: Progressing   

## 2018-10-21 NOTE — H&P (Signed)
History and Physical    [name] QIW:[id number] DOB: 1941-06-30 DOA: 10/20/2018  PCP: Patient, No Pcp Per Patient coming from: Nursing home  Chief Complaint: Altered mental status  HPI: [name] is a 77 y.o. male with medical history significant of end-stage renal disease on hemodialysis Monday Wednesday Friday, A. fib, CHF, diabetes, hypertension, hyperlipidemia, OSA presenting to the hospital from his nursing home for further evaluation of altered mental status. Per ED documentation, patient was brought in by EMS who reported that he was altered, found in his facility slumped over in his wheelchair.  On arrival, blood pressure 60/40.  He received her normal dialysis session yesterday.  Patient was awake, alert, and oriented x3 when seen by me.  Stated he was not sure why he is here.  Reported having shortness of breath with exertion and using oxygen intermittently only at hemodialysis.  States he does not use home oxygen.  He is not sure how long he has had shortness of breath.  States his daughter thinks his legs are swollen.  Reports having intermittent diarrhea for 2 months -brown, watery.  States he still makes a little urine and denies having any dysuria.  Denies having any fevers, chills, chest pain, abdominal pain, nausea, or vomiting.  No further history could be obtained from the patient.  ED Course: Afebrile.  Initially hypotensive with blood pressure 60/40 per ED documentation.  Subsequent blood pressure readings recorded in the chart are normal.  Intermittently tachycardic and tachypneic in the ED. No leukocytosis.  Lactate 3.3, improved to 2.5 with fluid resuscitation.  Chest x-ray negative for pneumonia.  Head CT negative for acute finding.  Patient received vancomycin, cefepime, metronidazole, 500 cc fluid bolus in the ED.  [facility] paged to admit.  Review of Systems: As per HPI otherwise 10 point review of systems negative.  Past Medical History:  Diagnosis Date  .  A-fib ([place])   . Anemia   . Blood transfusion   . BPH (benign prostatic hyperplasia)   . CHF (congestive heart failure) ([place])   . Chronic kidney disease   . CKD (chronic kidney disease)   . Diarrhea   . DM (diabetes mellitus) ([place])   . History of GI bleed    secondary to coumadin  . HTN (hypertension)   . Hyperlipidemia   . OSA (obstructive sleep apnea)    uses CPAP  . Secondary hyperparathyroidism of renal origin [facility])     Past Surgical History:  Procedure Laterality Date  . BVT  04/14/39   Left  Basilic Vein Transposition  . CHOLECYSTECTOMY    . EYE SURGERY     Catarct bil  . INSERTION OF DIALYSIS CATHETER  05/28/2012   Procedure: INSERTION OF DIALYSIS CATHETER;  Surgeon: Mal Misty, MD;  Location: [facility];  Service: Vascular;  Laterality: Right;  . Left arm shuntogram.    . Left forearm loop graft with 6 mm Gore-Tex graft.    . Pars plana vitrectomy with 25-gauge system       reports that he quit smoking about 14 years ago. His smoking use included cigarettes. He has never used smokeless tobacco. He reports that he does not drink alcohol or use drugs.  Allergies  Allergen Reactions  . Tape Itching and Other (See Comments)    Cloth tape only    Family History  Problem Relation Age of Onset  . Alzheimer's disease Mother   . Diabetes Father        Amputation:  bilateral legs  . Cancer Daughter        breast cancer  . Diabetes Son   . Heart disease Son        before age 86  . Hypertension Son   . Anesthesia problems Neg Hx     Prior to Admission medications   Medication Sig Start Date End Date Taking? Authorizing Provider  acetaminophen (TYLENOL) 500 MG tablet Take 1,000 mg by mouth 3 (three) times daily.   Yes [provider]  aspirin EC 325 MG tablet Take 1 tablet (325 mg total) by mouth daily. 02/14/15  Yes Nahser, Wonda Cheng, MD  calcium acetate (PHOSLO) 667 MG capsule Take 2,668 mg by mouth 3 (three) times daily with meals.  02/03/11  Yes [provider]  hydrOXYzine (ATARAX/VISTARIL) 25 MG tablet Take 25 mg by mouth every 8 (eight) hours as needed for itching.   Yes [provider]  losartan (COZAAR) 100 MG tablet Take 100 mg by mouth daily.   Yes [provider]  metoprolol succinate (TOPROL-XL) 100 MG 24 hr tablet Take 100 mg by mouth daily. Take with or immediately following a meal.   Yes [provider]  repaglinide (PRANDIN) 0.5 MG tablet Take 0.5 mg by mouth 3 (three) times daily.    Yes [provider]  amLODipine (NORVASC) 5 MG tablet Take 1 tablet (5 mg total) by mouth daily. NEEDS APPOINTMENT FOR FUTURE REFILLS Patient not taking: Reported on 10/20/2018 06/27/18   Leonie Man, MD  atorvastatin (LIPITOR) 40 MG tablet take 1 tablet by mouth once daily Patient not taking: Reported on 10/20/2018 06/21/12   Larey Dresser, MD  metoprolol succinate (TOPROL-XL) 50 MG 24 hr tablet Take 1 tablet (50 mg total) by mouth 2 (two) times daily. NEEDS APPOINTMENT FOR FUTURE REFILLS Patient not taking: Reported on 10/20/2018 06/27/18   Leonie Man, MD    Physical Exam: Vitals:   10/20/18 2330 10/20/18 2345 10/21/18 0025 10/21/18 0037  BP: 121/67 114/62  112/70  Pulse: 84 90  88  Resp: (!) 25 20  20   Temp:    (!) 97.5 F (36.4 C)  TempSrc:    Oral  SpO2: 100% 100%  100%  Weight:   77.6 kg 77.7 kg  Height:    5\' 6"  (1.676 m)   Physical Exam  Constitutional: He is oriented to person, place, and time. He appears well-developed and well-nourished. No distress.  HENT:  Head: Normocephalic and atraumatic.  Mouth/Throat: Oropharynx is clear and moist.  Eyes: Pupils are equal, round, and reactive to light. EOM are normal. Right eye exhibits no discharge. Left eye exhibits no discharge.  Neck: Neck supple. No tracheal deviation present.  Cardiovascular: Normal rate and intact distal pulses.  Irregularly irregular rhythm  Pulmonary/Chest: Effort normal and breath sounds normal. He has no  wheezes. He has no rales.  2 L oxygen via nasal cannula. No increased work of breathing.  Speaking comfortably in full sentences.  Abdominal: Soft. Bowel sounds are normal. He exhibits distension. There is tenderness. There is guarding. There is no rebound.  Musculoskeletal: He exhibits no edema.  Neurological: He is alert and oriented to person, place, and time.  Strength 5 out of 5 in bilateral upper and lower extremities.  No facial droop.  No slurring of speech.  Skin: Skin is warm and dry.  Psychiatric: His behavior is normal.     Labs on Admission: I have personally reviewed following labs and imaging studies  CBC: Recent Labs  Lab 10/20/18 1645  WBC 6.8  NEUTROABS 4.3  HGB 9.1*  HCT 30.0*  MCV 106.4*  PLT 161*   Basic Metabolic Panel: Recent Labs  Lab 10/20/18 1645  NA 143  K 3.5  CL 101  CO2 26  GLUCOSE 75  BUN 37*  CREATININE 5.10*  CALCIUM 9.5   GFR: Estimated Creatinine Clearance: 11.9 mL/min (A) (by C-G formula based on SCr of 5.1 mg/dL (H)). Liver Function Tests: Recent Labs  Lab 10/20/18 1645  AST 28  ALT 24  ALKPHOS 224*  BILITOT 1.2  PROT 6.7  ALBUMIN 3.2*   No results for input(s): LIPASE, AMYLASE in the last 168 hours. No results for input(s): AMMONIA in the last 168 hours. Coagulation Profile: No results for input(s): INR, PROTIME in the last 168 hours. Cardiac Enzymes: No results for input(s): CKTOTAL, CKMB, CKMBINDEX, TROPONINI in the last 168 hours. BNP (last 3 results) No results for input(s): PROBNP in the last 8760 hours. HbA1C: No results for input(s): HGBA1C in the last 72 hours. CBG: No results for input(s): GLUCAP in the last 168 hours. Lipid Profile: No results for input(s): CHOL, HDL, LDLCALC, TRIG, CHOLHDL, LDLDIRECT in the last 72 hours. Thyroid Function Tests: No results for input(s): TSH, T4TOTAL, FREET4, T3FREE, THYROIDAB in the last 72 hours. Anemia Panel: No results for input(s): VITAMINB12, FOLATE, FERRITIN,  TIBC, IRON, RETICCTPCT in the last 72 hours. Urine analysis:    Component Value Date/Time   COLORURINE YELLOW 10/20/2008 [place] 10/20/2008 0942   LABSPEC 1.011 10/20/2008 0942   PHURINE 6.0 10/20/2008 0942   GLUCOSEU NEGATIVE 10/20/2008 0942   HGBUR LARGE (A) 10/20/2008 0942   BILIRUBINUR NEGATIVE 10/20/2008 0942   KETONESUR NEGATIVE 10/20/2008 0942   PROTEINUR 100 (A) 10/20/2008 0942   UROBILINOGEN 1.0 10/20/2008 0942   NITRITE NEGATIVE 10/20/2008 0942   LEUKOCYTESUR SMALL (A) 10/20/2008 0942    Radiological Exams on Admission: Ct Head Wo Contrast  Result Date: 10/20/2018 CLINICAL DATA:  Altered LOC EXAM: CT HEAD WITHOUT CONTRAST TECHNIQUE: Contiguous axial images were obtained from the base of the skull through the vertex without intravenous contrast. COMPARISON:  10/13/2018 FINDINGS: Brain: No acute hemorrhage or intracranial mass. 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 Old lacunar infarct left basal ganglia. Moderate-to-marked atrophy. Moderate small vessel ischemic changes of the white matter. Stable enlarged ventricles. Vascular: No hyperdense vessels.  Carotid vascular calcification Skull: Normal. Negative for fracture or focal lesion. Sinuses/Orbits: Mild mucosal thickening in the sinuses. No acute orbital abnormality. Other: None IMPRESSION: 1. No CT evidence for acute intracranial abnormality. 2. Atrophy and small-vessel ischemic changes of the white matter. 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 Electronically Signed   By: Donavan Foil M.D.   On: 10/20/2018 18:49   Dg Chest Port 1 View  Result Date: 10/20/2018 CLINICAL DATA:  Altered mental status and hypotension. EXAM: PORTABLE CHEST 1 VIEW COMPARISON:  10/13/2018 and prior radiographs FINDINGS: UPPER limits normal heart size noted. Mild interstitial prominence again noted. No airspace disease, pleural effusion or pneumothorax. No acute bony abnormality identified. IMPRESSION: UPPER limits normal heart  size with unchanged interstitial prominence. Electronically Signed   By: Margarette Canada M.D.   On: 10/20/2018 16:42    EKG: Independently reviewed.  A. fib-heart rate 79.  Assessment/Plan Principal Problem:   Sepsis ([place]) Active Problems:   Type 2 diabetes mellitus ([facility])   OBSTRUCTIVE SLEEP APNEA   PAF (paroxysmal atrial fibrillation) ([facility])   CHF (congestive heart failure) ([place])  Hyperlipidemia   End stage renal disease ([facility])   Acute encephalopathy   Elevated alkaline phosphatase level   Chronic anemia   Thrombocytopenia ([facility])   Sepsis, unclear source -Afebrile.  Initially hypotensive with blood pressure 60/40 per ED documentation.  Subsequent blood pressure readings recorded in the chart are normal. Intermittently tachycardic and tachypneic. -No leukocytosis.  Lactate 3.3, improved to 2.5 with fluid resuscitation.  Chest x-ray negative for pneumonia.    -Continue broad-spectrum coverage with vancomycin, Zosyn, metronidazole -UA pending -Blood culture x2 pending -Continue to trend lactate -BP currently stable.  Received IV fluid bolus in the ED.  Acute encephalopathy -Likely secondary to sepsis -Head CT negative for acute abnormality.  Elevated alkaline phosphatase Alkaline phosphatase 224.  AST, ALT, T bili normal.  Patient denies having any abdominal pain.  Generalized abdominal tenderness and guarding on exam.  No rebound.  -Continue to monitor -Check lipase -Abdominal ultrasound   ESRD on hemodialysis Monday Wednesday Friday -Consult nephrology in the morning -Renal function panel -Continue renal meds  Paroxysmal A. Fib -CHA2DS2VASc 5.  Currently rate controlled. -Continue home metoprolol.  BP stable.  Chronic diastolic CHF -Stable.  Chest x-ray without pulmonary edema. -Continue dialysis; contact nephrology in the morning  Diabetes type 2 -Check A1c.  Siding scale insulin, CBG checks.  Hypertension -Continue home metoprolol  OSA -CPAP at night  Chronic  anemia -Hemoglobin 9.1.  Baseline 10.4 a week ago.  No signs of active bleeding. -Continue to monitor  Thrombocytopenia -Platelet count 106,000.  Improved since labs done a week ago.  No signs of active bleeding. -Continue to monitor  DVT prophylaxis: Subcutaneous heparin Code Status: Patient wishes to be full code Family Communication: No family present at bedside Disposition Plan: Anticipate discharge to his nursing home in 1 to 2 days after clinical improvement. Consults called: None Admission status: Observation   Shela Leff MD [facility] Pager [phone]  If 7PM-7AM, please contact night-coverage [email or web address] Password [email or web address]  10/21/2018, 4:11 AM

## 2018-10-22 ENCOUNTER — Inpatient Hospital Stay (HOSPITAL_COMMUNITY): Payer: Medicare Other

## 2018-10-22 LAB — BLOOD GAS, VENOUS
Acid-Base Excess: 5.8 mmol/L — ABNORMAL HIGH (ref 0.0–2.0)
Bicarbonate: 30.2 mmol/L — ABNORMAL HIGH (ref 20.0–28.0)
O2 Saturation: 36.6 %
PCO2 VEN: 48.2 mmHg (ref 44.0–60.0)
PH VEN: 7.414 (ref 7.250–7.430)
Patient temperature: 98.6

## 2018-10-22 LAB — GLUCOSE, CAPILLARY
GLUCOSE-CAPILLARY: 92 mg/dL (ref 70–99)
GLUCOSE-CAPILLARY: 107 mg/dL — AB (ref 70–99)
Glucose-Capillary: 124 mg/dL — ABNORMAL HIGH (ref 70–99)
Glucose-Capillary: 116 mg/dL — ABNORMAL HIGH (ref 70–99)

## 2018-10-22 LAB — COMPREHENSIVE METABOLIC PANEL
ALBUMIN: 3.3 g/dL — AB (ref 3.5–5.0)
ALK PHOS: 231 U/L — AB (ref 38–126)
ALT: 33 U/L (ref 0–44)
AST: 47 U/L — ABNORMAL HIGH (ref 15–41)
Anion gap: 17 — ABNORMAL HIGH (ref 5–15)
BUN: 29 mg/dL — ABNORMAL HIGH (ref 8–23)
CALCIUM: 9.1 mg/dL (ref 8.9–10.3)
CO2: 21 mmol/L — ABNORMAL LOW (ref 22–32)
Chloride: 103 mmol/L (ref 98–111)
Creatinine, Ser: 4.22 mg/dL — ABNORMAL HIGH (ref 0.61–1.24)
GFR calc non Af Amer: 12 mL/min — ABNORMAL LOW (ref >60)
GFR, EST AFRICAN AMERICAN: 14 mL/min — AB (ref >60)
Glucose, Bld: 88 mg/dL (ref 70–99)
POTASSIUM: 5.3 mmol/L — AB (ref 3.5–5.1)
Sodium: 141 mmol/L (ref 135–145)
TOTAL PROTEIN: 6.9 g/dL (ref 6.5–8.1)
Total Bilirubin: 1.9 mg/dL — ABNORMAL HIGH (ref 0.3–1.2)

## 2018-10-22 LAB — CBC
HCT: 32 % — ABNORMAL LOW (ref 39.0–52.0)
HEMOGLOBIN: 9.7 g/dL — AB (ref 13.0–17.0)
MCH: 32 pg (ref 26.0–34.0)
MCHC: 30.3 g/dL (ref 30.0–36.0)
MCV: 105.6 fL — ABNORMAL HIGH (ref 80.0–100.0)
Platelets: 131 10*3/uL — ABNORMAL LOW (ref 150–400)
RBC: 3.03 MIL/uL — ABNORMAL LOW (ref 4.22–5.81)
RDW: 18.8 % — AB (ref 11.5–15.5)
WBC: 8.4 10*3/uL (ref 4.0–10.5)
nRBC: 0.4 % — ABNORMAL HIGH (ref 0.0–0.2)

## 2018-10-22 LAB — PROTIME-INR
INR: 1.52
Prothrombin Time: 18.1 seconds — ABNORMAL HIGH (ref 11.4–15.2)

## 2018-10-22 LAB — PROCALCITONIN: PROCALCITONIN: 0.99 ng/mL

## 2018-10-22 LAB — AMMONIA: Ammonia: 42 umol/L — ABNORMAL HIGH (ref 9–35)

## 2018-10-22 LAB — MAGNESIUM: Magnesium: 2.2 mg/dL (ref 1.7–2.4)

## 2018-10-22 MED ORDER — AZITHROMYCIN 500 MG PO TABS
500.0000 mg | ORAL_TABLET | ORAL | Status: DC
  Filled 2018-10-22: qty 1

## 2018-10-22 MED ORDER — SODIUM CHLORIDE 0.9 % IV SOLN
1.0000 g | INTRAVENOUS | Status: DC
  Filled 2018-10-22: qty 10

## 2018-10-22 MED ORDER — LACTULOSE 10 GM/15ML PO SOLN
20.0000 g | Freq: Four times a day (QID) | ORAL | Status: DC
  Administered 2018-10-22 (×2): 20 g via ORAL
  Filled 2018-10-22 (×2): qty 30

## 2018-10-22 NOTE — Progress Notes (Addendum)
PROGRESS NOTE    [name]  UEK:[id number] DOB: Apr 07, 1941 DOA: 10/20/2018 PCP: Patient, No Pcp Per   Brief Narrative: Per HPI [name] is [name] 77 y.o. male with medical history significant of end-stage renal disease on hemodialysis Monday Wednesday Friday, [name]. fib, CHF, diabetes, hypertension, hyperlipidemia, OSA presenting to the hospital from his nursing home for further evaluation of altered mental status. Per ED documentation, patient was brought in by EMS who reported that he was altered, found in his facility slumped over in his wheelchair.  On arrival, blood pressure 60/40.  He received her normal dialysis session yesterday.  Patient was awake, alert, and oriented x3 when seen by me.  Stated he was not sure why he is here.  Reported having shortness of breath with exertion and using oxygen intermittently only at hemodialysis.  States he does not use home oxygen.  He is not sure how long he has had shortness of breath.  States his daughter thinks his legs are swollen.  Reports having intermittent diarrhea for 2 months -brown, watery.  States he still makes [name] little urine and denies having any dysuria.  Denies having any fevers, chills, chest pain, abdominal pain, nausea, or vomiting.  No further history could be obtained from the patient.  Assessment & Plan:   Principal Problem:   Sepsis ([place]) Active Problems:   Type 2 diabetes mellitus (HCC)   OBSTRUCTIVE SLEEP APNEA   PAF (paroxysmal atrial fibrillation) (HCC)   CHF (congestive heart failure) (HCC)   Hyperlipidemia   End stage renal disease (HCC)   Acute encephalopathy   Elevated alkaline phosphatase level   Chronic anemia   Thrombocytopenia (HCC)  Acute Hypoxic Respiratory Failure:  O2 requirement increased today, now requiring 4 L by [name] ABG with low pO2 Repeat CXR today with "bilateral pulm interstitial opacities" - consider crowding of lung markings vs interstitial edema vs viral/atypical infection.  This could  overload in setting of his ESRD, but pt has been acutely delirious which could point towards infection.  Will follow up CT scan, RVP, sputum cx.  MRSA negative.  Will hold off on CAP therapy for now with normal WBC count, afebrile, and improved on my exam, but low threshold to start.   Elevated Lactate  Hypotension  Concern for sepsis -Afebrile, normal WBC count.  Intermittent tachycardia/tachypnea.  CXR without evidence of pneumonia, but repeat today concerning for possible atypical/viral infection.  CAP therapy started as noted above.  -No leukocytosis.  Lactate improved - follow cultures - follow urinalysis  Acute encephalopathy - Waxing/waning.  Per nurse this AM, very confused, but [name]&Ox3 on my exam.   - Ammonia elevated -> start lactulose, follow.  Notable with elevated INR and thrombocytopenia as well. - ?possible atypical pneumonia, follow (holding abx for now) - blood gases without evidence of hypercarbia - Normal B12, HFP with elevated alk phos  Elevated alkaline phosphatase RUQ US unremarkable Continue to monitor  R pleural Effusion on RUQ [place]:  CXR without effusion noted  ESRD on hemodialysis Monday Wednesday Friday -Appreciate renal -Renal function panel -Continue renal meds  Paroxysmal [name]. Fib -CHA2DS2VASc 5.  Currently rate controlled. -Continue home metoprolol.  BP stable. -Not on anticoagulation, unclear why, will need to follow up  Chronic diastolic CHF -Stable.    Diabetes type 2 -Check A1c 4.2, d/c ssi  Hypertension -Continue home metoprolol  OSA -CPAP at night  Chronic anemia -Relatively stable today at about 9, follow -Continue to monitor  Thrombocytopenia -continue to monitor  DVT prophylaxis: heparin  Code Status: full  Family Communication: attempted to call daughter to discuss baseline, but no answer.  Left message. Disposition Plan: pending improvement   Consultants:   nephrology  Procedures:   none  Antimicrobials:    Anti-infectives (From admission, onward)   Start     Dose/Rate Route Frequency Ordered Stop   10/21/18 1200  ceFEPIme (MAXIPIME) 2 g in sodium chloride 0.9 % 100 mL IVPB  Status:  Discontinued     2 g 200 mL/hr over 30 Minutes Intravenous Every M-W-F (Hemodialysis) 10/20/18 1710 10/22/18 1423   10/21/18 1200  vancomycin (VANCOCIN) IVPB 750 mg/150 ml premix  Status:  Discontinued     750 mg 150 mL/hr over 60 Minutes Intravenous Every M-W-F (Hemodialysis) 10/20/18 1710 10/21/18 1150   10/20/18 1715  vancomycin (VANCOCIN) 1,500 mg in sodium chloride 0.9 % 500 mL IVPB     1,500 mg 250 mL/hr over 120 Minutes Intravenous  Once 10/20/18 1652 10/20/18 2024   10/20/18 1645  ceFEPIme (MAXIPIME) 2 g in sodium chloride 0.9 % 100 mL IVPB     2 g 200 mL/hr over 30 Minutes Intravenous  Once 10/20/18 1643 10/20/18 1758   10/20/18 1645  metroNIDAZOLE (FLAGYL) IVPB 500 mg  Status:  Discontinued     500 mg 100 mL/hr over 60 Minutes Intravenous Every 8 hours 10/20/18 1643 10/22/18 1431   10/20/18 1645  vancomycin (VANCOCIN) IVPB 1000 mg/200 mL premix  Status:  Discontinued     1,000 mg 200 mL/hr over 60 Minutes Intravenous  Once 10/20/18 1643 10/20/18 1652      Subjective: [name]&Ox3. Eating comfortable. No pain.  Objective: Vitals:   10/22/18 0438 10/22/18 1031 10/22/18 1208 10/22/18 1500  BP: 139/77 116/67 119/67   Pulse: 90 91 (!) 108 98  Resp: 20  19   Temp: 98.3 F (36.8 C)  98 F (36.7 C)   TempSrc: Oral  Oral   SpO2: 93%  (!) 87%   Weight:      Height:        Intake/Output Summary (Last 24 hours) at 10/22/2018 1706 Last data filed at 10/22/2018 1600 Gross per 24 hour  Intake 1346.94 ml  Output 0 ml  Net 1346.94 ml   Filed Weights   10/21/18 0037 10/21/18 1240 10/22/18 0246  Weight: 77.7 kg 78.5 kg 75 kg    Examination:  General: No acute distress. Cardiovascular: Heart sounds show [name] regular rate, and rhythm.  Lungs: Clear to auscultation bilaterally Abdomen: Soft,  nontender, nondistended  Neurological: Alert and oriented 3. Moves all extremities 4. Cranial nerves II through XII grossly intact. Skin: Warm and dry. No rashes or lesions. Extremities: No clubbing or cyanosis. No edema.  Psychiatric: Mood and affect are normal. Insight and judgment are appropriate.  Data Reviewed: I have personally reviewed following labs and imaging studies  CBC: Recent Labs  Lab 10/20/18 1645 10/21/18 0520 10/22/18 0606  WBC 6.8 8.6 8.4  NEUTROABS 4.3  --   --   HGB 9.1* 9.3* 9.7*  HCT 30.0* 31.5* 32.0*  MCV 106.4* 104.7* 105.6*  PLT 140* 143* 974*   Basic Metabolic Panel: Recent Labs  Lab 10/20/18 1645 10/21/18 0520 10/22/18 0606  NA 143 145 141  K 3.5 3.6 5.3*  CL 101 101 103  CO2 26 28 21*  GLUCOSE 75 74 88  BUN 37* 42* 29*  CREATININE 5.10* 5.78* 4.22*  CALCIUM 9.5 9.6 9.1  MG  --   --  2.2  PHOS  --  4.2  --    GFR: Estimated Creatinine Clearance: 13.2 mL/min (Ori Kreiter) (by C-G formula based on SCr of 4.22 mg/dL (H)). Liver Function Tests: Recent Labs  Lab 10/20/18 1645 10/21/18 0520 10/21/18 1200 10/22/18 0606  AST 28  --  25 47*  ALT 24  --  24 33  ALKPHOS 224*  --  205* 231*  BILITOT 1.2  --  1.2 1.9*  PROT 6.7  --  6.6 6.9  ALBUMIN 3.2* 3.2* 3.0* 3.3*   Recent Labs  Lab 10/21/18 0520  LIPASE 35   Recent Labs  Lab 10/21/18 1200 10/22/18 1228  AMMONIA 75* 42*   Coagulation Profile: Recent Labs  Lab 10/22/18 0606  INR 1.52   Cardiac Enzymes: No results for input(s): CKTOTAL, CKMB, CKMBINDEX, TROPONINI in the last 168 hours. BNP (last 3 results) No results for input(s): PROBNP in the last 8760 hours. HbA1C: Recent Labs    10/21/18 0520  HGBA1C 4.2*   CBG: Recent Labs  Lab 10/21/18 1722 10/21/18 2132 10/22/18 0821 10/22/18 1205 10/22/18 1634  GLUCAP 84 189* 92 107* 124*   Lipid Profile: No results for input(s): CHOL, HDL, LDLCALC, TRIG, CHOLHDL, LDLDIRECT in the last 72 hours. Thyroid Function Tests: No  results for input(s): TSH, T4TOTAL, FREET4, T3FREE, THYROIDAB in the last 72 hours. Anemia Panel: Recent Labs    10/21/18 1200  VITAMINB12 1,046*   Sepsis Labs: Recent Labs  Lab 10/20/18 1630 10/20/18 1907 10/21/18 0520 10/21/18 0638 10/22/18 0606  PROCALCITON  --   --   --  0.96 0.99  LATICACIDVEN 3.30* 2.50* 1.9 1.5  --     Recent Results (from the past 240 hour(s))  Blood Culture (routine x 2)     Status: None (Preliminary result)   Collection Time: 10/20/18  5:10 PM  Result Value Ref Range Status   Specimen Description BLOOD RIGHT WRIST  Final   Special Requests   Final    BOTTLES DRAWN AEROBIC AND ANAEROBIC Blood Culture adequate volume   Culture   Final    NO GROWTH 2 DAYS Performed at [facility], [place] [address], [place], [place] [address]    Report Status PENDING  Incomplete  Blood Culture (routine x 2)     Status: None (Preliminary result)   Collection Time: 10/20/18  5:10 PM  Result Value Ref Range Status   Specimen Description BLOOD RIGHT HAND  Final   Special Requests   Final    BOTTLES DRAWN AEROBIC AND ANAEROBIC Blood Culture results may not be optimal due to an inadequate volume of blood received in culture bottles   Culture   Final    NO GROWTH 2 DAYS Performed at [facility], [place] [address], [place], [place] [address]    Report Status PENDING  Incomplete  MRSA PCR Screening     Status: None   Collection Time: 10/21/18  3:49 AM  Result Value Ref Range Status   MRSA by PCR NEGATIVE NEGATIVE Final    Comment:        The GeneXpert MRSA Assay (FDA approved for NASAL specimens only), is one component of Lorece Keach comprehensive MRSA colonization surveillance program. It is not intended to diagnose MRSA infection nor to guide or monitor treatment for MRSA infections. Performed at [facility], [place] [address], [place], [place] [address]          Radiology Studies: Dg Chest 1 View  Result Date: 10/22/2018 CLINICAL DATA:  77 year old male admitted with altered mental status, hypotension. In stage renal disease on dialysis. EXAM: CHEST  1 VIEW COMPARISON:  10/20/2018 and earlier. FINDINGS: AP view at 0734 hours. Mildly lower lung volumes. Stable cardiac size and mediastinal contours. Calcified aortic atherosclerosis. Visualized tracheal air column is within normal limits. Continued mild diffuse increased interstitial opacity. No pneumothorax, pleural effusion or confluent opacity. Negative visible bowel gas. IMPRESSION: Continued mild nonspecific bilateral pulmonary interstitial opacity, might reflect crowding of lung markings. Pulmonary interstitial edema and viral/atypical respiratory infection are difficult to exclude. Electronically Signed   By: Genevie Ann M.D.   On: 10/22/2018 09:28   Ct Head Wo Contrast  Result Date: 10/20/2018 CLINICAL DATA:  Altered LOC EXAM: CT HEAD WITHOUT CONTRAST TECHNIQUE: Contiguous axial images were obtained from the base of the skull through the vertex without intravenous contrast. COMPARISON:  10/13/2018 FINDINGS: Brain: No acute hemorrhage or intracranial mass. 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 Old lacunar infarct left basal ganglia. Moderate-to-marked atrophy. Moderate small vessel ischemic changes of the white matter. Stable enlarged ventricles. Vascular: No hyperdense vessels.  Carotid vascular calcification Skull: Normal. Negative for fracture or focal lesion. Sinuses/Orbits: Mild mucosal thickening in the sinuses. No acute orbital abnormality. Other: None IMPRESSION: 1. No CT evidence for acute intracranial abnormality. 2. Atrophy and small-vessel ischemic changes of the white matter. 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 Electronically Signed   By: Donavan Foil M.D.   On: 10/20/2018 18:49   US Abdomen Complete  Result Date: 10/21/2018 CLINICAL DATA:  Abdominal pain EXAM: ABDOMEN ULTRASOUND COMPLETE COMPARISON:  None. FINDINGS: Gallbladder: Surgically absent Common bile duct:  Diameter: 7 mm-upper normal for age. No evident filling defect Liver: No focal lesion identified. Within normal limits in parenchymal echogenicity. Portal vein is patent on color Doppler imaging with normal direction of blood flow towards the liver. IVC: No abnormality visualized. Pancreas: Visualized portion unremarkable. Spleen: Size and appearance within normal limits. Right Kidney: Known end-stage renal disease. Length: 8 cm. Increased echogenicity. No hydronephrosis or mass Left Kidney: Length: 7 cm. There is increased echogenicity. No hydronephrosis or mass. Abdominal aorta: No aneurysm visualized. The distal aorta is not visible due to bowel gas. Other findings: Aviyah Swetz right pleural effusion is partially visualized. IMPRESSION: 1. No acute intra-abdominal finding. 2. Right pleural effusion. Electronically Signed   By: Monte Fantasia M.D.   On: 10/21/2018 18:15        Scheduled Meds: . aspirin EC  325 mg Oral Daily  . calcium acetate  2,668 mg Oral TID WC  . Chlorhexidine Gluconate Cloth  6 each Topical Q0600  . lactulose  20 g Oral QID  . metoprolol succinate  100 mg Oral Daily   Continuous Infusions: . sodium chloride 250 mL (10/22/18 1033)     LOS: 1 day    Time spent: over 30 min    Fayrene Helper, MD [facility] Pager [phone]  If 7PM-7AM, please contact night-coverage [email or web address] Password [email or web address] 10/22/2018, 5:06 PM

## 2018-10-22 NOTE — Progress Notes (Signed)
O2 sats on 3L was 86% so turned O2 up to 4L

## 2018-10-22 NOTE — Progress Notes (Signed)
Patient requested he get CHG bath later this morning as he has slept very little over night

## 2018-10-22 NOTE — Progress Notes (Signed)
  [facility] Progress Note    Subjective:   More awake and alert this morning but still a little confused.   Objective:   BP 116/67   Pulse 91   Temp 98.3 F (36.8 C) (Oral)   Resp 20   Ht 5\' 6"  (1.676 m)   Wt 75 kg   SpO2 93%   BMI 26.68 kg/m   Intake/Output: I/O last 3 completed shifts: In: 760 [P.O.:560; IV Piggyback:200] Out: 2500 [Other:2500]   Intake/Output this shift:  Total I/O In: 518.4 [P.O.:120; I.V.:25.9; IV Piggyback:372.5] Out: -  Weight change: 4.1 kg  Physical Exam: Gen: NAD CVS: no rub Resp: cta Abd: benign Ext: no edema, LUAVF +T/B  Labs: BMET Recent Labs  Lab 10/20/18 1645 10/21/18 0520 10/21/18 1200 10/22/18 0606  NA 143 145  --  141  K 3.5 3.6  --  5.3*  CL 101 101  --  103  CO2 26 28  --  21*  GLUCOSE 75 74  --  88  BUN 37* 42*  --  29*  CREATININE 5.10* 5.78*  --  4.22*  ALBUMIN 3.2* 3.2* 3.0* 3.3*  CALCIUM 9.5 9.6  --  9.1  PHOS  --  4.2  --   --    CBC Recent Labs  Lab 10/20/18 1645 10/21/18 0520 10/22/18 0606  WBC 6.8 8.6 8.4  NEUTROABS 4.3  --   --   HGB 9.1* 9.3* 9.7*  HCT 30.0* 31.5* 32.0*  MCV 106.4* 104.7* 105.6*  PLT 140* 143* 131*    @IMGRELPRIORS @ Medications:    . aspirin EC  325 mg Oral Daily  . calcium acetate  2,668 mg Oral TID WC  . Chlorhexidine Gluconate Cloth  6 each Topical Q0600  . heparin  5,000 Units Subcutaneous Q8H  . lactulose  20 g Oral TID  . metoprolol succinate  100 mg Oral Daily    Dialysis Orders:  MWF- AF  4hrs, BFR 400, DFR AF 1.5,  EDW 73kg, 2K/ 2.25Ca, Linear Na  Access: LU AVF  Heparin 2500 unit bolus Mircera 100 mcg q2wks - last 10/21 Hectorol 91mcg IV qHD   Venofer 50mg  IV qwk  Assessment/Plan: 1.  AMS - BC's no growth for 2 days. Lactic acid improved to 1.5. Ammonia elevated, 75.  Negative CT head and CXR.  Abdominal US unrevealing. On ABX. Per primary.  Low venous oxygen, will order ABG and repeat ammonia level as he may require lactulose. 2.   ESRD -  On HD MWF. Tolerated HD well but mildly elevated K and still 2 kg above edw.    3.  Hypertension/volume  - BP improved. 2L over EDW after HD.  May need short session of HD today if hypoxic.    4.  Anemia of CKD - Hgb 9.3 - recently dosed esa. 5.  Secondary Hyperparathyroidism -  Ca and phos at goal. Continue VDRA and binders. 6.  Nutrition - Renal diet with fluid restrictions. Nepro.  7.  DMT2 - per primary 8. Diastolic dysfunction/aortic stenosis 9. OSA 10. A fib - not on anticoagulation 11. Hx GIB 2/2 coumadin  Donetta Potts, MD [place] Pager [phone] 10/22/2018, 11:53 AM

## 2018-10-22 NOTE — Progress Notes (Signed)
Pt returns from CXR, radiology reports inability to get the bed rail down, therefore inability to perform lateral view. Radiology changed the order to cover what they were able to do; pt was unable to stand for alternate method.    Pt is restless in the bed. He is fidgety, per night shift this is how pt has been. Pt repositioned, gown adjusted, oxygen placed on pt.

## 2018-10-22 NOTE — Progress Notes (Signed)
Re-ordered VBG per lab request, phlebotomist on the floor to draw at this time. Will draw.

## 2018-10-22 NOTE — Progress Notes (Signed)
Patient repositioned in bed for second time.  Leads replaced/box restarted for third/forth time.  Pt is more alert than he was on shift report/change, he is more cooperative at this time- independently eating breakfast.

## 2018-10-23 ENCOUNTER — Inpatient Hospital Stay (HOSPITAL_COMMUNITY): Payer: Medicare Other

## 2018-10-23 LAB — RENAL FUNCTION PANEL
Albumin: 3.2 g/dL — ABNORMAL LOW (ref 3.5–5.0)
Anion gap: 15 (ref 5–15)
BUN: 43 mg/dL — ABNORMAL HIGH (ref 8–23)
CO2: 24 mmol/L (ref 22–32)
Calcium: 9.8 mg/dL (ref 8.9–10.3)
Chloride: 103 mmol/L (ref 98–111)
Creatinine, Ser: 6.1 mg/dL — ABNORMAL HIGH (ref 0.61–1.24)
GFR calc Af Amer: 9 mL/min — ABNORMAL LOW (ref >60)
GFR calc non Af Amer: 8 mL/min — ABNORMAL LOW (ref >60)
Glucose, Bld: 89 mg/dL (ref 70–99)
POTASSIUM: 4.7 mmol/L (ref 3.5–5.1)
Phosphorus: 4.2 mg/dL (ref 2.5–4.6)
Sodium: 142 mmol/L (ref 135–145)

## 2018-10-23 LAB — RESPIRATORY PANEL BY PCR
Adenovirus: NOT DETECTED
Bordetella pertussis: NOT DETECTED
CORONAVIRUS 229E-RVPPCR: NOT DETECTED
Chlamydophila pneumoniae: NOT DETECTED
Coronavirus HKU1: NOT DETECTED
Coronavirus NL63: NOT DETECTED
Coronavirus OC43: NOT DETECTED
INFLUENZA A-RVPPCR: NOT DETECTED
INFLUENZA B-RVPPCR: NOT DETECTED
METAPNEUMOVIRUS-RVPPCR: NOT DETECTED
Mycoplasma pneumoniae: NOT DETECTED
PARAINFLUENZA VIRUS 2-RVPPCR: NOT DETECTED
PARAINFLUENZA VIRUS 4-RVPPCR: NOT DETECTED
Parainfluenza Virus 1: NOT DETECTED
Parainfluenza Virus 3: NOT DETECTED
RESPIRATORY SYNCYTIAL VIRUS-RVPPCR: NOT DETECTED
RHINOVIRUS / ENTEROVIRUS - RVPPCR: NOT DETECTED

## 2018-10-23 LAB — CBC
HCT: 33.5 % — ABNORMAL LOW (ref 39.0–52.0)
Hemoglobin: 9.8 g/dL — ABNORMAL LOW (ref 13.0–17.0)
MCH: 31.5 pg (ref 26.0–34.0)
MCHC: 29.3 g/dL — ABNORMAL LOW (ref 30.0–36.0)
MCV: 107.7 fL — ABNORMAL HIGH (ref 80.0–100.0)
Platelets: 127 K/uL — ABNORMAL LOW (ref 150–400)
RBC: 3.11 MIL/uL — ABNORMAL LOW (ref 4.22–5.81)
RDW: 19 % — ABNORMAL HIGH (ref 11.5–15.5)
WBC: 8.2 K/uL (ref 4.0–10.5)
nRBC: 0.4 % — ABNORMAL HIGH (ref 0.0–0.2)

## 2018-10-23 LAB — GLUCOSE, CAPILLARY
GLUCOSE-CAPILLARY: 102 mg/dL — AB (ref 70–99)
Glucose-Capillary: 91 mg/dL (ref 70–99)
Glucose-Capillary: 91 mg/dL (ref 70–99)
Glucose-Capillary: 130 mg/dL — ABNORMAL HIGH (ref 70–99)

## 2018-10-23 LAB — HIV ANTIBODY (ROUTINE TESTING W REFLEX): HIV Screen 4th Generation wRfx: NONREACTIVE

## 2018-10-23 LAB — PROCALCITONIN: Procalcitonin: 1.15 ng/mL

## 2018-10-23 MED ORDER — LACTULOSE 10 GM/15ML PO SOLN: 20.0000 g | Freq: Two times a day (BID) | ORAL | Status: DC

## 2018-10-23 MED ORDER — AZITHROMYCIN 500 MG PO TABS
500.0000 mg | ORAL_TABLET | ORAL | Status: DC
  Administered 2018-10-23: 500 mg via ORAL
  Filled 2018-10-23: qty 1

## 2018-10-23 MED ORDER — SODIUM CHLORIDE 0.9 % IV SOLN
1.0000 g | INTRAVENOUS | Status: DC
  Administered 2018-10-23 – 2018-10-28 (×6): 1 g via INTRAVENOUS
  Filled 2018-10-23 (×7): qty 10

## 2018-10-23 NOTE — Progress Notes (Signed)
PROGRESS NOTE    [name]  PZW:[id number] DOB: 1941-04-18 DOA: 10/20/2018 PCP: Patient, No Pcp Per   Brief Narrative: Per HPI [name] is [name] 77 y.o. male with medical history significant of end-stage renal disease on hemodialysis Monday Wednesday Friday, [name]. fib, CHF, diabetes, hypertension, hyperlipidemia, OSA presenting to the hospital from his nursing home for further evaluation of altered mental status. Per ED documentation, patient was brought in by EMS who reported that he was altered, found in his facility slumped over in his wheelchair.  On arrival, blood pressure 60/40.  He received her normal dialysis session yesterday.  Patient was awake, alert, and oriented x3 when seen by me.  Stated he was not sure why he is here.  Reported having shortness of breath with exertion and using oxygen intermittently only at hemodialysis.  States he does not use home oxygen.  He is not sure how long he has had shortness of breath.  States his daughter thinks his legs are swollen.  Reports having intermittent diarrhea for 2 months -brown, watery.  States he still makes [name] little urine and denies having any dysuria.  Denies having any fevers, chills, chest pain, abdominal pain, nausea, or vomiting.  No further history could be obtained from the patient.  Assessment & Plan:   Principal Problem:   Sepsis ([place]) Active Problems:   Type 2 diabetes mellitus (HCC)   OBSTRUCTIVE SLEEP APNEA   PAF (paroxysmal atrial fibrillation) (HCC)   CHF (congestive heart failure) (HCC)   Hyperlipidemia   End stage renal disease (HCC)   Acute encephalopathy   Elevated alkaline phosphatase level   Chronic anemia   Thrombocytopenia (HCC)  Acute Hypoxic Respiratory Failure:  O2 requirement increased today, now requiring 4 L by [name] with low pO2 Repeat CXR today with "bilateral pulm interstitial opacities" - consider crowding of lung markings vs interstitial edema vs viral/atypical infection.  This could  overload in setting of his ESRD (nephrology planning to challenge EDW tomorrow), but pt has been acutely delirious which could point towards infection.  Will follow up CT scan, RVP, sputum cx.  MRSA negative.  Will hold off on CAP therapy for now with normal WBC count, afebrile, and improved on my exam, but low threshold to start.   Elevated Lactate  Hypotension  Concern for sepsis -Afebrile, normal WBC count.  Intermittent tachycardia/tachypnea.  CXR without evidence of pneumonia, but repeat today concerning for possible atypical/viral infection.  CAP therapy started as noted above.  -No leukocytosis.  Lactate improved - follow cultures (bcx NGTD x3) - follow urinalysis  Acute encephalopathy - Waxing/waning.  [name]&Ox3 again for me today, but per nurse waxing/waning.  She notes it's worse after he wakes up from sleep (maybe with O2 off?) - Ammonia elevated -> continue lactulose with goal 2-3 BM daily.  Notable with elevated INR and thrombocytopenia as well. - ?possible atypical pneumonia, follow (holding abx for now) - blood gases without evidence of hypercarbia, follow chest CT above - Normal B12, HFP with elevated alk phos  Elevated alkaline phosphatase RUQ US unremarkable Continue to monitor  R pleural Effusion on RUQ [place]:  CXR without effusion noted  ESRD on hemodialysis Monday Wednesday Friday -Appreciate renal -Renal function panel -Continue renal meds  Paroxysmal [name]. Fib  RVR - HR elevated today, pt had not taken metop, follow -CHA2DS2VASc 5.  Currently rate controlled. -Continue home metoprolol.  BP stable. -Not on anticoagulation, unclear why, will need to follow up.  Haven't been able  to reach family.  Chronic diastolic CHF -Stable.    Diabetes type 2 -Check A1c 4.2, d/c ssi  Hypertension -Continue home metoprolol  OSA -CPAP at night  Chronic anemia -Relatively stable today at about 9, follow -Continue to monitor  Thrombocytopenia -continue to  monitor  Abrasion: due to frequent diarrhea with lactulose, holding lactulose for 2-3 BM daily, follow, q2 hr turn  DVT prophylaxis: heparin  Code Status: full  Family Communication: attempted to call daughter again, no answer Disposition Plan: pending improvement   Consultants:   nephrology  Procedures:   none  Antimicrobials:  Anti-infectives (From admission, onward)   Start     Dose/Rate Route Frequency Ordered Stop   10/22/18 1800  cefTRIAXone (ROCEPHIN) 1 g in sodium chloride 0.9 % 100 mL IVPB  Status:  Discontinued     1 g 200 mL/hr over 30 Minutes Intravenous Every 24 hours 10/22/18 1725 10/22/18 1735   10/22/18 1800  azithromycin (ZITHROMAX) tablet 500 mg  Status:  Discontinued     500 mg Oral Every 24 hours 10/22/18 1725 10/22/18 1735   10/21/18 1200  ceFEPIme (MAXIPIME) 2 g in sodium chloride 0.9 % 100 mL IVPB  Status:  Discontinued     2 g 200 mL/hr over 30 Minutes Intravenous Every M-W-F (Hemodialysis) 10/20/18 1710 10/22/18 1423   10/21/18 1200  vancomycin (VANCOCIN) IVPB 750 mg/150 ml premix  Status:  Discontinued     750 mg 150 mL/hr over 60 Minutes Intravenous Every M-W-F (Hemodialysis) 10/20/18 1710 10/21/18 1150   10/20/18 1715  vancomycin (VANCOCIN) 1,500 mg in sodium chloride 0.9 % 500 mL IVPB     1,500 mg 250 mL/hr over 120 Minutes Intravenous  Once 10/20/18 1652 10/20/18 2024   10/20/18 1645  ceFEPIme (MAXIPIME) 2 g in sodium chloride 0.9 % 100 mL IVPB     2 g 200 mL/hr over 30 Minutes Intravenous  Once 10/20/18 1643 10/20/18 1758   10/20/18 1645  metroNIDAZOLE (FLAGYL) IVPB 500 mg  Status:  Discontinued     500 mg 100 mL/hr over 60 Minutes Intravenous Every 8 hours 10/20/18 1643 10/22/18 1431   10/20/18 1645  vancomycin (VANCOCIN) IVPB 1000 mg/200 mL premix  Status:  Discontinued     1,000 mg 200 mL/hr over 60 Minutes Intravenous  Once 10/20/18 1643 10/20/18 1652      Subjective: [name]&Ox3. Significant diarrhea. C/o pain to  bottom.  Objective: Vitals:   10/22/18 1929 10/23/18 0520 10/23/18 1230 10/23/18 1447  BP: 104/75 (!) 139/94 (!) 142/95   Pulse: 97 (!) 112 (!) 114   Resp: _0 Temp: 97.6 F (36.4 C) 98 F (36.7 C) 98.4 F (36.9 C)   TempSrc: Oral Oral Oral   SpO2:  96% 100% 90%  Weight:  73.6 kg    Height:        Intake/Output Summary (Last 24 hours) at 10/23/2018 1558 Last data filed at 10/23/2018 0800 Gross per 24 hour  Intake 108.52 ml  Output -  Net 108.52 ml   Filed Weights   10/21/18 1240 10/22/18 0246 10/23/18 0520  Weight: 78.5 kg 75 kg 73.6 kg    Examination:  General: No acute distress. Cardiovascular: irregularly irregular, tachy Lungs: Clear to auscultation bilaterally Abdomen: Soft, nontender, nondistended  Neurological: Alert and oriented 3. Moves all extremities 4. Cranial nerves II through XII grossly intact. Skin: Warm and dry. No rashes or lesions. Extremities: No clubbing or cyanosis. No edema.  Psychiatric: Mood and affect are normal.  Insight and judgment are appropriate.  Data Reviewed: I have personally reviewed following labs and imaging studies  CBC: Recent Labs  Lab 10/20/18 1645 10/21/18 0520 10/22/18 0606 10/23/18 0637  WBC 6.8 8.6 8.4 8.2  NEUTROABS 4.3  --   --   --   HGB 9.1* 9.3* 9.7* 9.8*  HCT 30.0* 31.5* 32.0* 33.5*  MCV 106.4* 104.7* 105.6* 107.7*  PLT 140* 143* 131* 099*   Basic Metabolic Panel: Recent Labs  Lab 10/20/18 1645 10/21/18 0520 10/22/18 0606 10/23/18 0637  NA 143 145 141 142  K 3.5 3.6 5.3* 4.7  CL 101 101 103 103  CO2 26 28 21* 24  GLUCOSE 75 74 88 89  BUN 37* 42* 29* 43*  CREATININE 5.10* 5.78* 4.22* 6.10*  CALCIUM 9.5 9.6 9.1 9.8  MG  --   --  2.2  --   PHOS  --  4.2  --  4.2   GFR: Estimated Creatinine Clearance: 9.2 mL/min (Carmencita Cusic) (by C-G formula based on SCr of 6.1 mg/dL (H)). Liver Function Tests: Recent Labs  Lab 10/20/18 1645 10/21/18 0520 10/21/18 1200 10/22/18 0606 10/23/18 0637  AST 28   --  25 47*  --   ALT 24  --  24 33  --   ALKPHOS 224*  --  205* 231*  --   BILITOT 1.2  --  1.2 1.9*  --   PROT 6.7  --  6.6 6.9  --   ALBUMIN 3.2* 3.2* 3.0* 3.3* 3.2*   Recent Labs  Lab 10/21/18 0520  LIPASE 35   Recent Labs  Lab 10/21/18 1200 10/22/18 1228  AMMONIA 75* 42*   Coagulation Profile: Recent Labs  Lab 10/22/18 0606  INR 1.52   Cardiac Enzymes: No results for input(s): CKTOTAL, CKMB, CKMBINDEX, TROPONINI in the last 168 hours. BNP (last 3 results) No results for input(s): PROBNP in the last 8760 hours. HbA1C: Recent Labs    10/21/18 0520  HGBA1C 4.2*   CBG: Recent Labs  Lab 10/22/18 1205 10/22/18 1634 10/22/18 2159 10/23/18 0824 10/23/18 1233  GLUCAP 107* 124* 116* 91 91   Lipid Profile: No results for input(s): CHOL, HDL, LDLCALC, TRIG, CHOLHDL, LDLDIRECT in the last 72 hours. Thyroid Function Tests: No results for input(s): TSH, T4TOTAL, FREET4, T3FREE, THYROIDAB in the last 72 hours. Anemia Panel: Recent Labs    10/21/18 1200  VITAMINB12 1,046*   Sepsis Labs: Recent Labs  Lab 10/20/18 1630 10/20/18 1907 10/21/18 0520 10/21/18 8338 10/22/18 0606 10/23/18 0637  PROCALCITON  --   --   --  0.96 0.99 1.15  LATICACIDVEN 3.30* 2.50* 1.9 1.5  --   --     Recent Results (from the past 240 hour(s))  Blood Culture (routine x 2)     Status: None (Preliminary result)   Collection Time: 10/20/18  5:10 PM  Result Value Ref Range Status   Specimen Description BLOOD RIGHT WRIST  Final   Special Requests   Final    BOTTLES DRAWN AEROBIC AND ANAEROBIC Blood Culture adequate volume   Culture   Final    NO GROWTH 3 DAYS Performed at [facility], [place] [address], [place], [place] [address]    Report Status PENDING  Incomplete  Blood Culture (routine x 2)     Status: None (Preliminary result)   Collection Time: 10/20/18  5:10 PM  Result Value Ref Range Status   Specimen Description BLOOD RIGHT HAND  Final   Special Requests  Final     BOTTLES DRAWN AEROBIC AND ANAEROBIC Blood Culture results may not be optimal due to an inadequate volume of blood received in culture bottles   Culture   Final    NO GROWTH 3 DAYS Performed at [facility], [place] [address], [place], [place] [address]    Report Status PENDING  Incomplete  MRSA PCR Screening     Status: None   Collection Time: 10/21/18  3:49 AM  Result Value Ref Range Status   MRSA by PCR NEGATIVE NEGATIVE Final    Comment:        The GeneXpert MRSA Assay (FDA approved for NASAL specimens only), is one component of Wandy Bossler comprehensive MRSA colonization surveillance program. It is not intended to diagnose MRSA infection nor to guide or monitor treatment for MRSA infections. Performed at [facility], [place] [address] [place], [place] [address]   Respiratory Panel by PCR     Status: None   Collection Time: 10/22/18  5:03 PM  Result Value Ref Range Status   Adenovirus NOT DETECTED NOT DETECTED Final   Coronavirus 229E NOT DETECTED NOT DETECTED Final   Coronavirus HKU1 NOT DETECTED NOT DETECTED Final   Coronavirus NL63 NOT DETECTED NOT DETECTED Final   Coronavirus OC43 NOT DETECTED NOT DETECTED Final   Metapneumovirus NOT DETECTED NOT DETECTED Final   Rhinovirus / Enterovirus NOT DETECTED NOT DETECTED Final   Influenza Kersti Scavone NOT DETECTED NOT DETECTED Final   Influenza B NOT DETECTED NOT DETECTED Final   Parainfluenza Virus 1 NOT DETECTED NOT DETECTED Final   Parainfluenza Virus 2 NOT DETECTED NOT DETECTED Final   Parainfluenza Virus 3 NOT DETECTED NOT DETECTED Final   Parainfluenza Virus 4 NOT DETECTED NOT DETECTED Final   Respiratory Syncytial Virus NOT DETECTED NOT DETECTED Final   Bordetella pertussis NOT DETECTED NOT DETECTED Final   Chlamydophila pneumoniae NOT DETECTED NOT DETECTED Final   Mycoplasma pneumoniae NOT DETECTED NOT DETECTED Final    Comment: Performed at [facility], [place] [address], [place], [place] [address]         Radiology  Studies: Dg Chest 1 View  Result Date: 10/22/2018 CLINICAL DATA:  77 year old male admitted with altered mental status, hypotension. In stage renal disease on dialysis. EXAM: CHEST  1 VIEW COMPARISON:  10/20/2018 and earlier. FINDINGS: AP view at 0734 hours. Mildly lower lung volumes. Stable cardiac size and mediastinal contours. Calcified aortic atherosclerosis. Visualized tracheal air column is within normal limits. Continued mild diffuse increased interstitial opacity. No pneumothorax, pleural effusion or confluent opacity. Negative visible bowel gas. IMPRESSION: Continued mild nonspecific bilateral pulmonary interstitial opacity, might reflect crowding of lung markings. Pulmonary interstitial edema and viral/atypical respiratory infection are difficult to exclude. Electronically Signed   By: Genevie Ann M.D.   On: 10/22/2018 09:28   US Abdomen Complete  Result Date: 10/21/2018 CLINICAL DATA:  Abdominal pain EXAM: ABDOMEN ULTRASOUND COMPLETE COMPARISON:  None. FINDINGS: Gallbladder: Surgically absent Common bile duct: Diameter: 7 mm-upper normal for age. No evident filling defect Liver: No focal lesion identified. Within normal limits in parenchymal echogenicity. Portal vein is patent on color Doppler imaging with normal direction of blood flow towards the liver. IVC: No abnormality visualized. Pancreas: Visualized portion unremarkable. Spleen: Size and appearance within normal limits. Right Kidney: Known end-stage renal disease. Length: 8 cm. Increased echogenicity. No hydronephrosis or mass Left Kidney: Length: 7 cm. There is increased echogenicity. No hydronephrosis or mass. Abdominal aorta: No aneurysm visualized. The distal  aorta is not visible due to bowel gas. Other findings: Graycen Degan right pleural effusion is partially visualized. IMPRESSION: 1. No acute intra-abdominal finding. 2. Right pleural effusion. Electronically Signed   By: Monte Fantasia M.D.   On: 10/21/2018 18:15        Scheduled  Meds: . aspirin EC  325 mg Oral Daily  . calcium acetate  2,668 mg Oral TID WC  . Chlorhexidine Gluconate Cloth  6 each Topical Q0600  . lactulose  20 g Oral BID  . metoprolol succinate  100 mg Oral Daily   Continuous Infusions: . sodium chloride 250 mL (10/22/18 1033)     LOS: 2 days    Time spent: over 30 min    Fayrene Helper, MD [facility] Pager [phone]  If 7PM-7AM, please contact night-coverage [email or web address] Password [facility] 10/23/2018, 3:58 PM

## 2018-10-23 NOTE — NC FL2 (Signed)
[place] LEVEL OF CARE SCREENING TOOL     IDENTIFICATION  Patient Name: [name] Birthdate: [birth year] Sex: male Admission Date (Current Location): 10/20/2018  [facility] and [place] Number:  Herbalist and Address:  [facility], [place] [address] [place], [place] [address]      Provider Number: [id number]  Attending Physician Name and Address:  Elodia Florence., *  Relative Name and Phone Number:  [name], [phone]    Current Level of Care: Hospital Recommended Level of Care: [place] Prior Approval Number:    Date Approved/Denied:   PASRR Number:    Discharge Plan: Other (Comment)([facility])    Current Diagnoses: Patient Active Problem List   Diagnosis Date Noted  . Sepsis ([place]) 10/21/2018  . Acute encephalopathy 10/21/2018  . Elevated alkaline phosphatase level 10/21/2018  . Chronic anemia 10/21/2018  . Thrombocytopenia ([place]) 10/21/2018  . Pulmonary HTN ([place]) 03/04/2017  . Aortic valve stenosis 03/04/2017  . Hypertension, accelerated with heart disease, without CHF 01/12/2017  . Dyspnea on exertion 01/12/2017  . End stage renal disease ([place]) 06/07/2012  . Hyperlipidemia 04/24/2011  . HYPERTENSION, BENIGN 10/24/2009  . Type 2 diabetes mellitus ([place]) 10/23/2009  . OBSTRUCTIVE SLEEP APNEA 10/23/2009  . PAF (paroxysmal atrial fibrillation) ([place]) 10/23/2009  . CHF (congestive heart failure) ([place]) 10/23/2009    Orientation RESPIRATION BLADDER Height & Weight     Self, Place  O2(2L prn) Continent Weight: 162 lb 4.1 oz (73.6 kg)(scale b) Height:  5\' 6"  (167.6 cm)  BEHAVIORAL SYMPTOMS/MOOD NEUROLOGICAL BOWEL NUTRITION STATUS      Incontinent(due to meds) Diet(renal)  AMBULATORY STATUS COMMUNICATION OF NEEDS Skin   Limited Assist Verbally Skin abrasions(stage 1)                       Personal Care Assistance Level of Assistance  Bathing, Feeding, Dressing Bathing  Assistance: Limited assistance Feeding assistance: Independent Dressing Assistance: Limited assistance     Functional Limitations Info  Sight, Hearing, Speech Sight Info: Adequate Hearing Info: Adequate Speech Info: Adequate    SPECIAL CARE FACTORS FREQUENCY  PT (By licensed PT), OT (By licensed OT)     PT Frequency: 3x wk OT Frequency: 3x wk            Contractures Contractures Info: Not present    Additional Factors Info  Code Status, Allergies Code Status Info: Full code Allergies Info: Tape           Current Medications (10/23/2018):  This is the current hospital active medication list Current Facility-Administered Medications  Medication Dose Route Frequency Provider Last Rate Last Dose  . 0.9 %  sodium chloride infusion   Intravenous PRN Elodia Florence., MD 10 mL/hr at 10/22/18 1033 250 mL at 10/22/18 1033  . acetaminophen (TYLENOL) tablet 650 mg  650 mg Oral Q6H PRN Shela Leff, MD      . aspirin EC tablet 325 mg  325 mg Oral Daily Shela Leff, MD   325 mg at 10/22/18 1031  . calcium acetate (PHOSLO) capsule 2,668 mg  2,668 mg Oral TID WC Shela Leff, MD   2,668 mg at 10/23/18 0551  . Chlorhexidine Gluconate Cloth 2 % PADS 6 each  6 each Topical Q0600 [place], [place], [place]   Stopped at 10/22/18 0801  . hydrOXYzine (ATARAX/VISTARIL) tablet 25 mg  25 mg Oral Q8H PRN Shela Leff, MD      .  ipratropium-albuterol (DUONEB) 0.5-2.5 (3) MG/3ML nebulizer solution 3 mL  3 mL Nebulization Q6H PRN Shela Leff, MD   3 mL at 10/21/18 0803  . lactulose (CHRONULAC) 10 GM/15ML solution 20 g  20 g Oral BID Elodia Florence., MD      . metoprolol succinate (TOPROL-XL) 24 hr tablet 100 mg  100 mg Oral Daily Shela Leff, MD   100 mg at 10/22/18 1032     Discharge Medications: Please see discharge summary for a list of discharge medications.  Relevant Imaging Results:  Relevant Lab Results:   Additional Information SS#  [id number]  Wende Neighbors, LCSW

## 2018-10-23 NOTE — Progress Notes (Signed)
Contact/droplet precautions discontinued.

## 2018-10-23 NOTE — Progress Notes (Signed)
Patient requiring assistance with cleaning during shift report; the trend for his night,

## 2018-10-23 NOTE — Progress Notes (Signed)
   10/23/18 1230  MEWS Score  Resp 20  Pulse Rate (!) 114  BP (!) 142/95  Temp 98.4 F (36.9 C)  MEWS RR 0  MEWS Pulse 2  MEWS Systolic 0  MEWS LOC 0  MEWS Temp 0  MEWS Score 2  MEWS Score Color Yellow     Vital Signs MEWS/VS Documentation      10/22/2018 1929 10/22/2018 2025 10/23/2018 0520 10/23/2018 1230   MEWS Score:  0  0  2  2   MEWS Score Color:  Green  Green  Yellow  Yellow   Resp:  20  -  20  20   Pulse:  97  -  (!) 112  (!) 114   BP:  104/75  -  (!) 139/94  (!) 142/95   Temp:  97.6 F (36.4 C)  -  98 F (36.7 C)  98.4 F (36.9 C)   O2 Device:  Nasal Cannula  -  Room Air  Nasal Cannula   O2 Flow Rate (L/min):  -  -  -  4 L/min   Level of Consciousness:  -  Alert  -  -        Patient refused metoprolol this am. Pt finally agreed to take the betablocker close to 1pm. Will monitor heartrate    Maud Deed Tobias-Diakun 10/23/2018,2:30 PM

## 2018-10-23 NOTE — Clinical Social Work Note (Signed)
Clinical Social Work Assessment  Patient Details  Name: [name] MRN: [id number] Date of Birth: 16-Feb-1941  Date of referral:  10/23/18               Reason for consult:  Discharge Planning                Permission sought to share information with:  Family Supports Permission granted to share information::  Yes, Verbal Permission Granted  Name::     [name]  Agency::  [facility]  Relationship::  daughter  Contact Information:  [phone]  Housing/Transportation Living arrangements for the past 2 months:  [place] of Information:  Adult Children Patient Interpreter Needed:  None Criminal Activity/Legal Involvement Pertinent to Current Situation/Hospitalization:  No - Comment as needed Significant Relationships:  Adult Children, Other Family Members, Community Support Lives with:  Facility Resident Do you feel safe going back to the place where you live?  Yes Need for family participation in patient care:  Yes (Comment)  Care giving concerns:  No family at bedside. Patient is from [facility] and has support from adult daughter and other family members.   Social Worker assessment / plan:  CSW unable to do assessment with patient as he is only cognitive x 3. CSW spoke with patients daughter [name] via phone. [name] stated she would like patient to return back to [facility] once medically cleared by MD  CSW contacted facility to make them aware of patients pending discharge. CSW spoke with [place] patients med tech at [employer]. [place] stated that patient will need to get approval through RN before patient can return. [place] stated she will reach out to CSW once RN has been contacted and they have made decision   Employment status:  Retired Forensic scientist:  Medicare PT Recommendations:  No Follow Up Information / Referral to community resources:  Other (Comment Required)  Patient/Family's Response to care:  [name]  verbalized appreciation for the care patient has received during his stay  Patient/Family's Understanding of and Emotional Response to Diagnosis, Current Treatment, and Prognosis:  Patient will return to [facility]  Emotional Assessment Appearance:  Appears stated age Attitude/Demeanor/Rapport:  Unable to Assess Affect (typically observed):  Unable to Assess Orientation:  Oriented to Self, Oriented to Place, Oriented to  Time Alcohol / Substance use:  Not Applicable Psych involvement (Current and /or in the community):  No (Comment)  Discharge Needs  Concerns to be addressed:  Care Coordination Readmission within the last 30 days:  Yes Current discharge risk:  Dependent with Mobility Barriers to Discharge:  Continued Medical Work up   [employer], LCSW 10/23/2018, 10:26 AM

## 2018-10-23 NOTE — Plan of Care (Signed)
  Problem: Elimination: Goal: Will not experience complications related to bowel motility Outcome: Not Progressing   Problem: Coping: Goal: Level of anxiety will decrease Outcome: Progressing   Problem: Health Behavior/Discharge Planning: Goal: Ability to manage health-related needs will improve Outcome: Progressing

## 2018-10-23 NOTE — Progress Notes (Signed)
Pt refused CPAP for the night. RT will continue to monitor as needed.  

## 2018-10-23 NOTE — Progress Notes (Signed)
  [facility] Progress Note    Subjective:   Somnolent but arousable   Objective:   BP (!) 139/94   Pulse (!) 112   Temp 98 F (36.7 C) (Oral)   Resp 20   Ht 5\' 6"  (1.676 m)   Wt 73.6 kg Comment: scale b  SpO2 96%   BMI 26.19 kg/m   Intake/Output: I/O last 3 completed shifts: In: 1226.9 [P.O.:720; I.V.:33.9; IV Piggyback:473] Out: 0    Intake/Output this shift:  No intake/output data recorded. Weight change: -4.9 kg  Physical Exam: Gen: NAD CVS: tachy at 112, no rub Resp: occ rhonchi Abd: +BS, soft, nt/nd Ext:no edema, LUE AVF +T/B  Labs: BMET Recent Labs  Lab 10/20/18 1645 10/21/18 0520 10/21/18 1200 10/22/18 0606 10/23/18 0637  NA 143 145  --  141 142  K 3.5 3.6  --  5.3* 4.7  CL 101 101  --  103 103  CO2 26 28  --  21* 24  GLUCOSE 75 74  --  88 89  BUN 37* 42*  --  29* 43*  CREATININE 5.10* 5.78*  --  4.22* 6.10*  ALBUMIN 3.2* 3.2* 3.0* 3.3* 3.2*  CALCIUM 9.5 9.6  --  9.1 9.8  PHOS  --  4.2  --   --  4.2   CBC Recent Labs  Lab 10/20/18 1645 10/21/18 0520 10/22/18 0606 10/23/18 0637  WBC 6.8 8.6 8.4 8.2  NEUTROABS 4.3  --   --   --   HGB 9.1* 9.3* 9.7* 9.8*  HCT 30.0* 31.5* 32.0* 33.5*  MCV 106.4* 104.7* 105.6* 107.7*  PLT 140* 143* 131* 127*    @IMGRELPRIORS @ Medications:    . aspirin EC  325 mg Oral Daily  . calcium acetate  2,668 mg Oral TID WC  . Chlorhexidine Gluconate Cloth  6 each Topical Q0600  . lactulose  20 g Oral BID  . metoprolol succinate  100 mg Oral Daily   Dialysis Orders: MWF-AF 4hrs, BFR400, DFRAF 1.5, EDW 73kg,2K/2.25Ca, Linear Na  Access:LU AVF Heparin2500 unit bolus Mircera165mcg q2wks - last 10/21 Hectorol10mcg IV qHD Venofer 50mg  IV qwk   Assessment/ Plan:   1. AMS - BC's no growth for 2 days. Lactic acid improved to 1.5. Ammonia elevated, 75. Negative CT head and CXR. Abdominal US unrevealing. On ABX. Per primary.   1. Low pO2 on ABG and now on oxygent 2. repeat  ammonia level improved to 42. 3. MS waxing and waning repeat CT scan of head ordered per primary 2. Acute hypoxic respiratory failure- on 4l [place].  Will challenge edw tomorrow with HD but may be related to PNA.  Will follow after UF tomorrow.  3. ESRD - On HD MWF. Tolerated HD well on Friday.  Plan to keep on schedule.   4. Hypertension/volume - BP improved. 2L over EDW after HD.   5. Anemia of CKD - Hgb 9.3 - recently dosed esa. 6. Secondary Hyperparathyroidism - Ca and phos at goal. Continue VDRA and binders. 7. Nutrition - Renal diet with fluid restrictions. Nepro.  8. DMT2 - per primary 9. Diastolic dysfunction/aortic stenosis 10. OSA 11. A fib - not on anticoagulation 12. Hx GIB 2/2 coumadin   Donetta Potts, MD [place] Pager [phone] 10/23/2018, 11:41 AM

## 2018-10-24 ENCOUNTER — Inpatient Hospital Stay (HOSPITAL_COMMUNITY): Payer: Medicare Other

## 2018-10-24 LAB — CBC
HCT: 33.7 % — ABNORMAL LOW (ref 39.0–52.0)
HEMOGLOBIN: 10.1 g/dL — AB (ref 13.0–17.0)
MCH: 31.5 pg (ref 26.0–34.0)
MCHC: 30 g/dL (ref 30.0–36.0)
MCV: 105 fL — AB (ref 80.0–100.0)
Platelets: 128 10*3/uL — ABNORMAL LOW (ref 150–400)
RBC: 3.21 MIL/uL — AB (ref 4.22–5.81)
RDW: 19.3 % — ABNORMAL HIGH (ref 11.5–15.5)
WBC: 10.7 10*3/uL — ABNORMAL HIGH (ref 4.0–10.5)
nRBC: 0.4 % — ABNORMAL HIGH (ref 0.0–0.2)

## 2018-10-24 LAB — BLOOD GAS, ARTERIAL
Acid-Base Excess: 4.8 mmol/L — ABNORMAL HIGH (ref 0.0–2.0)
Bicarbonate: 28.4 mmol/L — ABNORMAL HIGH (ref 20.0–28.0)
Drawn by: 105521
O2 Content: 3 L/min
O2 SAT: 86.5 %
Patient temperature: 98.6
pCO2 arterial: 39.7 mmHg (ref 32.0–48.0)
pH, Arterial: 7.468 — ABNORMAL HIGH (ref 7.350–7.450)
pO2, Arterial: 54.2 mmHg — ABNORMAL LOW (ref 83.0–108.0)

## 2018-10-24 LAB — PROTIME-INR
INR: 1.58
Prothrombin Time: 18.7 seconds — ABNORMAL HIGH (ref 11.4–15.2)

## 2018-10-24 LAB — LIPASE, BLOOD: Lipase: 32 U/L (ref 11–51)

## 2018-10-24 LAB — COMPREHENSIVE METABOLIC PANEL
ALK PHOS: 206 U/L — AB (ref 38–126)
ALT: 26 U/L (ref 0–44)
AST: 30 U/L (ref 15–41)
Albumin: 3.1 g/dL — ABNORMAL LOW (ref 3.5–5.0)
Anion gap: 16 — ABNORMAL HIGH (ref 5–15)
BUN: 66 mg/dL — ABNORMAL HIGH (ref 8–23)
CO2: 23 mmol/L (ref 22–32)
Calcium: 9.8 mg/dL (ref 8.9–10.3)
Chloride: 103 mmol/L (ref 98–111)
Creatinine, Ser: 7.8 mg/dL — ABNORMAL HIGH (ref 0.61–1.24)
GFR calc non Af Amer: 6 mL/min — ABNORMAL LOW (ref >60)
GFR, EST AFRICAN AMERICAN: 7 mL/min — AB (ref >60)
Glucose, Bld: 113 mg/dL — ABNORMAL HIGH (ref 70–99)
Potassium: 5.8 mmol/L — ABNORMAL HIGH (ref 3.5–5.1)
SODIUM: 142 mmol/L (ref 135–145)
TOTAL PROTEIN: 6.9 g/dL (ref 6.5–8.1)
Total Bilirubin: 1.7 mg/dL — ABNORMAL HIGH (ref 0.3–1.2)

## 2018-10-24 LAB — GLUCOSE, CAPILLARY
GLUCOSE-CAPILLARY: 121 mg/dL — AB (ref 70–99)
Glucose-Capillary: 99 mg/dL (ref 70–99)
Glucose-Capillary: 89 mg/dL (ref 70–99)
Glucose-Capillary: 218 mg/dL — ABNORMAL HIGH (ref 70–99)

## 2018-10-24 LAB — MAGNESIUM: Magnesium: 2.5 mg/dL — ABNORMAL HIGH (ref 1.7–2.4)

## 2018-10-24 MED ORDER — HEPARIN SODIUM (PORCINE) 1000 UNIT/ML DIALYSIS: 20.0000 [IU]/kg | INTRAMUSCULAR | Status: DC | PRN

## 2018-10-24 MED ORDER — METOPROLOL TARTRATE 100 MG PO TABS
100.0000 mg | ORAL_TABLET | Freq: Two times a day (BID) | ORAL | Status: DC
  Administered 2018-10-25 – 2018-10-27 (×5): 100 mg via ORAL
  Filled 2018-10-24 (×5): qty 1

## 2018-10-24 MED ORDER — LOPERAMIDE HCL 2 MG PO CAPS
2.0000 mg | ORAL_CAPSULE | Freq: Four times a day (QID) | ORAL | Status: DC | PRN
  Administered 2018-10-24 – 2018-11-01 (×6): 2 mg via ORAL
  Filled 2018-10-24 (×6): qty 1

## 2018-10-24 MED ORDER — ONDANSETRON HCL 4 MG/2ML IJ SOLN
4.0000 mg | Freq: Four times a day (QID) | INTRAMUSCULAR | Status: DC | PRN
  Administered 2018-10-24: 4 mg via INTRAVENOUS
  Filled 2018-10-24: qty 2

## 2018-10-24 MED ORDER — COLLAGENASE 250 UNIT/GM EX OINT
TOPICAL_OINTMENT | Freq: Every day | CUTANEOUS | Status: DC
  Administered 2018-10-24 – 2018-11-02 (×10): via TOPICAL
  Filled 2018-10-24: qty 30

## 2018-10-24 MED ORDER — SODIUM CHLORIDE 0.9 % IV SOLN
500.0000 mg | INTRAVENOUS | Status: DC
  Administered 2018-10-24 – 2018-10-25 (×2): 500 mg via INTRAVENOUS
  Filled 2018-10-24 (×2): qty 500

## 2018-10-24 NOTE — Progress Notes (Signed)
RT NOTE:  Pt refuses to wear CPAP. RT will monitor.

## 2018-10-24 NOTE — Progress Notes (Signed)
[facility] ROUNDING NOTE   Subjective:   Patient appears to be somnolent this morning.  Blood pressure 138/68 pulse of 1062 temperature 97.9 O2 sats 100% nasal cannula 2 L  Sodium 142 potassium 5.8 chloride 103 CO2 23 BUN 66 creatinine 7.8 glucose 113 calcium 9.8 magnesium 2.5 alkaline phosphatase 206 AST 30 ALT 26 total bilirubin 1.7 WBC 10.7 hemoglobin 10.1 platelets 128   Objective:  Vital signs in last 24 hours:  Temp:  [97.3 F (36.3 C)-98.4 F (36.9 C)] 97.9 F (36.6 C) (10/28 0840) Pulse Rate:  [89-114] 102 (10/28 1000) Resp:  [20] 20 (10/28 0840) BP: (98-146)/(53-97) 138/68 (10/28 1000) SpO2:  [85 %-100 %] 100 % (10/28 0840) Weight:  [72.6 kg] 72.6 kg (10/28 0554)  Weight change: -1 kg Filed Weights   10/22/18 0246 10/23/18 0520 10/24/18 0554  Weight: 75 kg 73.6 kg 72.6 kg    Intake/Output: I/O last 3 completed shifts: In: 178.3 [P.O.:60; I.V.:18.3; IV Piggyback:100] Out: -    Intake/Output this shift:  No intake/output data recorded.  CVS-tachycardic RS- CTA slightly diminished at base ABD- BS present soft non-distended EXT- no edema left upper extremity AV fistula with thrill and bruit   Basic Metabolic Panel: Recent Labs  Lab 10/20/18 1645 10/21/18 0520 10/22/18 0606 10/23/18 0637 10/24/18 0614  NA 143 145 141 142 142  K 3.5 3.6 5.3* 4.7 5.8*  CL 101 101 103 103 103  CO2 26 28 21* 24 23  GLUCOSE 75 74 88 89 113*  BUN 37* 42* 29* 43* 66*  CREATININE 5.10* 5.78* 4.22* 6.10* 7.80*  CALCIUM 9.5 9.6 9.1 9.8 9.8  MG  --   --  2.2  --  2.5*  PHOS  --  4.2  --  4.2  --     Liver Function Tests: Recent Labs  Lab 10/20/18 1645 10/21/18 0520 10/21/18 1200 10/22/18 0606 10/23/18 0637 10/24/18 0614  AST 28  --  25 47*  --  30  ALT 24  --  24 33  --  26  ALKPHOS 224*  --  205* 231*  --  206*  BILITOT 1.2  --  1.2 1.9*  --  1.7*  PROT 6.7  --  6.6 6.9  --  6.9  ALBUMIN 3.2* 3.2* 3.0* 3.3* 3.2* 3.1*   Recent Labs  Lab  10/21/18 0520  LIPASE 35   Recent Labs  Lab 10/21/18 1200 10/22/18 1228  AMMONIA 75* 42*    CBC: Recent Labs  Lab 10/20/18 1645 10/21/18 0520 10/22/18 0606 10/23/18 0637 10/24/18 0614  WBC 6.8 8.6 8.4 8.2 10.7*  NEUTROABS 4.3  --   --   --   --   HGB 9.1* 9.3* 9.7* 9.8* 10.1*  HCT 30.0* 31.5* 32.0* 33.5* 33.7*  MCV 106.4* 104.7* 105.6* 107.7* 105.0*  PLT 140* 143* 131* 127* 128*    Cardiac Enzymes: No results for input(s): CKTOTAL, CKMB, CKMBINDEX, TROPONINI in the last 168 hours.  BNP: Invalid input(s): POCBNP  CBG: Recent Labs  Lab 10/23/18 0824 10/23/18 1233 10/23/18 1721 10/23/18 2125 10/24/18 0752  GLUCAP 91 91 130* 102* 99    Microbiology: Results for orders placed or performed during the hospital encounter of 10/20/18  Blood Culture (routine x 2)     Status: None (Preliminary result)   Collection Time: 10/20/18  5:10 PM  Result Value Ref Range Status   Specimen Description BLOOD RIGHT WRIST  Final   Special Requests   Final    BOTTLES  DRAWN AEROBIC AND ANAEROBIC Blood Culture adequate volume   Culture   Final    NO GROWTH 3 DAYS Performed at [facility], [place] [address] [place], [place] [address]    Report Status PENDING  Incomplete  Blood Culture (routine x 2)     Status: None (Preliminary result)   Collection Time: 10/20/18  5:10 PM  Result Value Ref Range Status   Specimen Description BLOOD RIGHT HAND  Final   Special Requests   Final    BOTTLES DRAWN AEROBIC AND ANAEROBIC Blood Culture results may not be optimal due to an inadequate volume of blood received in culture bottles   Culture   Final    NO GROWTH 3 DAYS Performed at [facility], [place] [address] [place] , [place] [address]    Report Status PENDING  Incomplete  MRSA PCR Screening     Status: None   Collection Time: 10/21/18  3:49 AM  Result Value Ref Range Status   MRSA by PCR NEGATIVE NEGATIVE Final    Comment:        The GeneXpert MRSA Assay (FDA approved for  NASAL specimens only), is one component of a comprehensive MRSA colonization surveillance program. It is not intended to diagnose MRSA infection nor to guide or monitor treatment for MRSA infections. Performed at [facility], [place] [address], [place], [place] [address]   Respiratory Panel by PCR     Status: None   Collection Time: 10/22/18  5:03 PM  Result Value Ref Range Status   Adenovirus NOT DETECTED NOT DETECTED Final   Coronavirus 229E NOT DETECTED NOT DETECTED Final   Coronavirus HKU1 NOT DETECTED NOT DETECTED Final   Coronavirus NL63 NOT DETECTED NOT DETECTED Final   Coronavirus OC43 NOT DETECTED NOT DETECTED Final   Metapneumovirus NOT DETECTED NOT DETECTED Final   Rhinovirus / Enterovirus NOT DETECTED NOT DETECTED Final   Influenza A NOT DETECTED NOT DETECTED Final   Influenza B NOT DETECTED NOT DETECTED Final   Parainfluenza Virus 1 NOT DETECTED NOT DETECTED Final   Parainfluenza Virus 2 NOT DETECTED NOT DETECTED Final   Parainfluenza Virus 3 NOT DETECTED NOT DETECTED Final   Parainfluenza Virus 4 NOT DETECTED NOT DETECTED Final   Respiratory Syncytial Virus NOT DETECTED NOT DETECTED Final   Bordetella pertussis NOT DETECTED NOT DETECTED Final   Chlamydophila pneumoniae NOT DETECTED NOT DETECTED Final   Mycoplasma pneumoniae NOT DETECTED NOT DETECTED Final    Comment: Performed at [facility], [place] [address], [place], [place] [address]    Coagulation Studies: Recent Labs    10/22/18 0606 10/24/18 0614  LABPROT 18.1* 18.7*  INR 1.52 1.58    Urinalysis: No results for input(s): COLORURINE, LABSPEC, PHURINE, GLUCOSEU, HGBUR, BILIRUBINUR, KETONESUR, PROTEINUR, UROBILINOGEN, NITRITE, LEUKOCYTESUR in the last 72 hours.  Invalid input(s): APPERANCEUR    Imaging: Ct Chest Wo Contrast  Result Date: 10/23/2018 CLINICAL DATA:  Acute respiratory illness. EXAM: CT CHEST WITHOUT CONTRAST TECHNIQUE: Multidetector CT imaging of the chest was performed  following the standard protocol without IV contrast. COMPARISON:  Chest radiograph, 10/22/2018 and older exams. FINDINGS: Cardiovascular: Heart is normal in size. There are dense three-vessel coronary artery calcifications. No pericardial effusion. Great vessels are normal in caliber. Aortic atherosclerosis. Mediastinum/Nodes: No neck base or axillary masses or enlarged lymph nodes. Scattered prominent mediastinal lymph nodes, none pathologically enlarged. No mediastinal or hilar masses. Trachea is patent. Esophagus is unremarkable. Lungs/Pleura: Moderate right pleural effusion. No left  pleural effusion. Lungs show upper lobe predominant interstitial thickening with intervening ground-glass opacities. No lung mass or suspicious nodule. Several small calcified granuloma. No pneumothorax. Upper Abdomen: No acute finding. Musculoskeletal: No fracture or acute finding. No osteoblastic or osteolytic lesions. Chest wall: Prominent bilateral gynecomastia. IMPRESSION: 1. Lungs demonstrate upper lobe predominant interstitial thickening with intervening ground-glass opacities. Differential diagnosis includes pulmonary edema as well as inflammation or atypical infection. 2. Moderate right pleural effusion. 3. Dense coronary artery calcifications.  Aortic atherosclerosis. Aortic Atherosclerosis (ICD10-I70.0). Electronically Signed   By: Lajean Manes M.D.   On: 10/23/2018 17:23     Medications:   . sodium chloride 250 mL (10/22/18 1033)  . cefTRIAXone (ROCEPHIN)  IV 1 g (10/23/18 2200)   . aspirin EC  325 mg Oral Daily  . azithromycin  500 mg Oral Q24H  . calcium acetate  2,668 mg Oral TID WC  . Chlorhexidine Gluconate Cloth  6 each Topical Q0600  . lactulose  20 g Oral BID  . metoprolol succinate  100 mg Oral Daily   sodium chloride, acetaminophen, heparin, hydrOXYzine, ipratropium-albuterol  Assessment/ Plan:  1. AMS - BC's no growth for 3 days. Lactic acid improved to 1.5. Ammonia elevated, 75. Negative  CT head and CXR. Abdominal USunrevealing. On ABX. Per primary.  1. Low pO2 on ABG and now on oxygen 2 L 2. repeat ammonia level improved to 42. 3. MS waxing and waning repeat CT scan of head ordered per primary 2. Acute hypoxic respiratory failure- on 2 L nasal cannula.  Will challenge edw tomorrow with HD but may be related to PNA.  Will follow after UF tomorrow. CT chest reveals moderate right pleural effusion upper lobe predominant interstitial thickening with interval groundglass opacities consistent with pulmonary edema or inflammation or atypical infection. 3. ESRD - On HD MWF. Tolerated HD well on Friday.  Plan to keep on schedule.Removal of 2.5 L.  Will challenge with dialysis for fluid removal 4. Hypertension/volume - BP improved.  Continue to challenge dry weight 5. Anemia of CKD - Hgb 10.1 6. Secondary Hyperparathyroidism - Ca and phos at goal. Continue VDRA and binders. 7. Nutrition - Renal diet with fluid restrictions. Nepro.  8. DMT2 - per primary 9. Diastolic dysfunction/aortic stenosis 10. OSA 11. A fib - not on anticoagulation 12. Hx GIB 2/2 coumadin    LOS: 3 Sherril Croon @TODAY @10 :13 AM

## 2018-10-24 NOTE — Progress Notes (Signed)
PROGRESS NOTE    [name]  HDQ:[id number] DOB: [birth year] DOA: 10/20/2018 PCP: Patient, No Pcp Per   Brief Narrative: Per HPI [name] is a 77 y.o. male with medical history significant of end-stage renal disease on hemodialysis Monday Wednesday Friday, A. fib, CHF, diabetes, hypertension, hyperlipidemia, OSA presenting to the hospital from his nursing home for further evaluation of altered mental status. Per ED documentation, patient was brought in by EMS who reported that he was altered, found in his facility slumped over in his wheelchair.  On arrival, blood pressure 60/40.  He received her normal dialysis session yesterday.  Patient was awake, alert, and oriented x3 when seen by me.  Stated he was not sure why he is here.  Reported having shortness of breath with exertion and using oxygen intermittently only at hemodialysis.  States he does not use home oxygen.  He is not sure how long he has had shortness of breath.  States his daughter thinks his legs are swollen.  Reports having intermittent diarrhea for 2 months -brown, watery.  States he still makes a little urine and denies having any dysuria.  Denies having any fevers, chills, chest pain, abdominal pain, nausea, or vomiting.  No further history could be obtained from the patient.  Assessment & Plan:   Principal Problem:   Sepsis ([place]) Active Problems:   Type 2 diabetes mellitus (HCC)   OBSTRUCTIVE SLEEP APNEA   PAF (paroxysmal atrial fibrillation) (HCC)   CHF (congestive heart failure) (HCC)   Hyperlipidemia   End stage renal disease (HCC)   Acute encephalopathy   Elevated alkaline phosphatase level   Chronic anemia   Thrombocytopenia (HCC)  Acute Hypoxic Respiratory Failure:  O2 requirement increased on 10/27, requiring 4 L by [place] -> improved today to 2 L by [place] ABG with low pO2 CT cheest with interstitial thickening with intervening ground glass opacities (consider pulmonary edema vs inflammation or atypical  infection) Appreciate nephrology who is challenging with nephrology for fluid removal As pt has been delirious which could be c/w infection, he was started on CAP treatment (negative MRSA PCR and RVP). Continue ceftriaxone/azithromycin.  Sputum cx if able.  Urine strep, legionella if able.  Blood cx NGTD.     Elevated Lactate  Hypotension  Concern for sepsis -Afebrile, normal WBC count (although elevated 10/28).  Intermittent tachycardia/tachypnea.  -Treating possible pneumonia as noted above -pt also with wound to L 5th toe, follow plain films - follow cultures (bcx NGTD x4) - follow urinalysis if able  Acute encephalopathy - Waxing/waning.  A&Ox3 again for me today, but he continues to wax and wane with some lethargy and somnolence at times.   - Labs notable for elevated ammonia, but pt now with frequent diarrhea, holding lactulose -> follow ammonia tomorrow - abx as above for possible pneumonia, follow x ray of L 5th toe for possible source as well - blood gases without evidence of hypercarbia - Normal B12, HFP with elevated alk phos and mildly elevated bilirubin  Nausea  Vomiting: pt with profuse vomiting this evening.  Copious.  C/o abdominal discomfort with this.   Add on lipase to AM labs Follow CT abdomen pelvis  Diarrhea  Buttocks Wound: after starting lactulose, pt with copious diarrhea and moisture associated skin damage.  Per HPI, some chronicity to diarrhea.  Lactulose currently on hold and imodium ordered.  Wound c/s, appreciate recs.  Elevated alkaline phosphatase RUQ US unremarkable Continue to monitor  R pleural Effusion:  IR c/s  for thoracentesis  ESRD on hemodialysis Monday Wednesday Friday -Appreciate renal -Renal function panel -Continue renal meds  Paroxysmal A. Fib  RVR - With some persistent mild RVR again today, will transition to 100 metop BID -CHA2DS2VASc 5.  Currently rate controlled. -Continue home metoprolol.  BP stable. - need to  follow up with family why no anticoagulation  Chronic diastolic CHF -Stable.    Diabetes type 2 -Check A1c 4.2, d/c ssi  Hypertension -Continue home metoprolol  OSA -CPAP at night  Chronic anemia -Relatively stable today at about 9, follow -Continue to monitor  Thrombocytopenia -continue to monitor  DVT prophylaxis: heparin  Code Status: full  Family Communication: spoke with daughter today about father.  Updated regarding situation.  She notes when she's seen him during this hospitalization he hasn't been at baseline, this is much different.  She notes the foot wound has been there for a while.  Discussed plan for thoracentesis.  Discussed plan for abx and continued w/u of ams. Disposition Plan: pending improvement   Consultants:   nephrology  Procedures:   none  Antimicrobials:  Anti-infectives (From admission, onward)   Start     Dose/Rate Route Frequency Ordered Stop   10/23/18 2000  cefTRIAXone (ROCEPHIN) 1 g in sodium chloride 0.9 % 100 mL IVPB     1 g 200 mL/hr over 30 Minutes Intravenous Every 24 hours 10/23/18 1902 10/30/18 1959   10/23/18 2000  azithromycin (ZITHROMAX) tablet 500 mg     500 mg Oral Every 24 hours 10/23/18 1902 10/30/18 1959   10/22/18 1800  cefTRIAXone (ROCEPHIN) 1 g in sodium chloride 0.9 % 100 mL IVPB  Status:  Discontinued     1 g 200 mL/hr over 30 Minutes Intravenous Every 24 hours 10/22/18 1725 10/22/18 1735   10/22/18 1800  azithromycin (ZITHROMAX) tablet 500 mg  Status:  Discontinued     500 mg Oral Every 24 hours 10/22/18 1725 10/22/18 1735   10/21/18 1200  ceFEPIme (MAXIPIME) 2 g in sodium chloride 0.9 % 100 mL IVPB  Status:  Discontinued     2 g 200 mL/hr over 30 Minutes Intravenous Every M-W-F (Hemodialysis) 10/20/18 1710 10/22/18 1423   10/21/18 1200  vancomycin (VANCOCIN) IVPB 750 mg/150 ml premix  Status:  Discontinued     750 mg 150 mL/hr over 60 Minutes Intravenous Every M-W-F (Hemodialysis) 10/20/18 1710 10/21/18  1150   10/20/18 1715  vancomycin (VANCOCIN) 1,500 mg in sodium chloride 0.9 % 500 mL IVPB     1,500 mg 250 mL/hr over 120 Minutes Intravenous  Once 10/20/18 1652 10/20/18 2024   10/20/18 1645  ceFEPIme (MAXIPIME) 2 g in sodium chloride 0.9 % 100 mL IVPB     2 g 200 mL/hr over 30 Minutes Intravenous  Once 10/20/18 1643 10/20/18 1758   10/20/18 1645  metroNIDAZOLE (FLAGYL) IVPB 500 mg  Status:  Discontinued     500 mg 100 mL/hr over 60 Minutes Intravenous Every 8 hours 10/20/18 1643 10/22/18 1431   10/20/18 1645  vancomycin (VANCOCIN) IVPB 1000 mg/200 mL premix  Status:  Discontinued     1,000 mg 200 mL/hr over 60 Minutes Intravenous  Once 10/20/18 1643 10/20/18 1652      Subjective: A&Ox3. Sleepy.  Objective: Vitals:   10/24/18 1230 10/24/18 1242 10/24/18 1252 10/24/18 1432  BP: 113/67 134/70 134/70 118/65  Pulse: (!) 108 (!) 115 (!) 125 (!) 102  Resp:  20 20   Temp:  97.6 F (36.4 C) 97.6 F (36.4 C)  TempSrc:  Oral Oral   SpO2:  98%  100%  Weight:  79.4 kg    Height:        Intake/Output Summary (Last 24 hours) at 10/24/2018 1832 Last data filed at 10/24/2018 1252 Gross per 24 hour  Intake 178.34 ml  Output 3903 ml  Net -3724.66 ml   Filed Weights   10/24/18 0554 10/24/18 0840 10/24/18 1242  Weight: 72.6 kg 83.9 kg 79.4 kg    Examination:  General: No acute distress. Cardiovascular: irregularly irregular, mildly tachy Lungs: Clear to auscultation bilaterally  Abdomen: Soft, nontender, nondistended Neurological: Alert and oriented 3, sleepy today, a bit lethragic. Moves all extremities 4. Cranial nerves II through XII grossly intact. Skin: Warm and dry. No rashes or lesions. Extremities: L 5th toe with ulcer Psychiatric:  Insight and judgment are impaired. .  Data Reviewed: I have personally reviewed following labs and imaging studies  CBC: Recent Labs  Lab 10/20/18 1645 10/21/18 0520 10/22/18 0606 10/23/18 0637 10/24/18 0614  WBC 6.8 8.6 8.4  8.2 10.7*  NEUTROABS 4.3  --   --   --   --   HGB 9.1* 9.3* 9.7* 9.8* 10.1*  HCT 30.0* 31.5* 32.0* 33.5* 33.7*  MCV 106.4* 104.7* 105.6* 107.7* 105.0*  PLT 140* 143* 131* 127* 161*   Basic Metabolic Panel: Recent Labs  Lab 10/20/18 1645 10/21/18 0520 10/22/18 0606 10/23/18 0637 10/24/18 0614  NA 143 145 141 142 142  K 3.5 3.6 5.3* 4.7 5.8*  CL 101 101 103 103 103  CO2 26 28 21* 24 23  GLUCOSE 75 74 88 89 113*  BUN 37* 42* 29* 43* 66*  CREATININE 5.10* 5.78* 4.22* 6.10* 7.80*  CALCIUM 9.5 9.6 9.1 9.8 9.8  MG  --   --  2.2  --  2.5*  PHOS  --  4.2  --  4.2  --    GFR: Estimated Creatinine Clearance: 7.9 mL/min (A) (by C-G formula based on SCr of 7.8 mg/dL (H)). Liver Function Tests: Recent Labs  Lab 10/20/18 1645 10/21/18 0520 10/21/18 1200 10/22/18 0606 10/23/18 0637 10/24/18 0614  AST 28  --  25 47*  --  30  ALT 24  --  24 33  --  26  ALKPHOS 224*  --  205* 231*  --  206*  BILITOT 1.2  --  1.2 1.9*  --  1.7*  PROT 6.7  --  6.6 6.9  --  6.9  ALBUMIN 3.2* 3.2* 3.0* 3.3* 3.2* 3.1*   Recent Labs  Lab 10/21/18 0520  LIPASE 35   Recent Labs  Lab 10/21/18 1200 10/22/18 1228  AMMONIA 75* 42*   Coagulation Profile: Recent Labs  Lab 10/22/18 0606 10/24/18 0614  INR 1.52 1.58   Cardiac Enzymes: No results for input(s): CKTOTAL, CKMB, CKMBINDEX, TROPONINI in the last 168 hours. BNP (last 3 results) No results for input(s): PROBNP in the last 8760 hours. HbA1C: No results for input(s): HGBA1C in the last 72 hours. CBG: Recent Labs  Lab 10/23/18 1721 10/23/18 2125 10/24/18 0752 10/24/18 1237 10/24/18 1725  GLUCAP 130* 102* 99 89 218*   Lipid Profile: No results for input(s): CHOL, HDL, LDLCALC, TRIG, CHOLHDL, LDLDIRECT in the last 72 hours. Thyroid Function Tests: No results for input(s): TSH, T4TOTAL, FREET4, T3FREE, THYROIDAB in the last 72 hours. Anemia Panel: No results for input(s): VITAMINB12, FOLATE, FERRITIN, TIBC, IRON, RETICCTPCT in the  last 72 hours. Sepsis Labs: Recent Labs  Lab 10/20/18 1630 10/20/18 1907 10/21/18  1025 10/21/18 8527 10/22/18 0606 10/23/18 0637  PROCALCITON  --   --   --  0.96 0.99 1.15  LATICACIDVEN 3.30* 2.50* 1.9 1.5  --   --     Recent Results (from the past 240 hour(s))  Blood Culture (routine x 2)     Status: None (Preliminary result)   Collection Time: 10/20/18  5:10 PM  Result Value Ref Range Status   Specimen Description BLOOD RIGHT WRIST  Final   Special Requests   Final    BOTTLES DRAWN AEROBIC AND ANAEROBIC Blood Culture adequate volume   Culture   Final    NO GROWTH 4 DAYS Performed at [facility], [place] [address] [place], [place] [address]    Report Status PENDING  Incomplete  Blood Culture (routine x 2)     Status: None (Preliminary result)   Collection Time: 10/20/18  5:10 PM  Result Value Ref Range Status   Specimen Description BLOOD RIGHT HAND  Final   Special Requests   Final    BOTTLES DRAWN AEROBIC AND ANAEROBIC Blood Culture results may not be optimal due to an inadequate volume of blood received in culture bottles   Culture   Final    NO GROWTH 4 DAYS Performed at [facility], [place] [address], [place], [place] [address]    Report Status PENDING  Incomplete  MRSA PCR Screening     Status: None   Collection Time: 10/21/18  3:49 AM  Result Value Ref Range Status   MRSA by PCR NEGATIVE NEGATIVE Final    Comment:        The GeneXpert MRSA Assay (FDA approved for NASAL specimens only), is one component of a comprehensive MRSA colonization surveillance program. It is not intended to diagnose MRSA infection nor to guide or monitor treatment for MRSA infections. Performed at [facility], [place] [address], [place], [place] [address]   Respiratory Panel by PCR     Status: None   Collection Time: 10/22/18  5:03 PM  Result Value Ref Range Status   Adenovirus NOT DETECTED NOT DETECTED Final   Coronavirus 229E NOT DETECTED NOT DETECTED Final    Coronavirus HKU1 NOT DETECTED NOT DETECTED Final   Coronavirus NL63 NOT DETECTED NOT DETECTED Final   Coronavirus OC43 NOT DETECTED NOT DETECTED Final   Metapneumovirus NOT DETECTED NOT DETECTED Final   Rhinovirus / Enterovirus NOT DETECTED NOT DETECTED Final   Influenza A NOT DETECTED NOT DETECTED Final   Influenza B NOT DETECTED NOT DETECTED Final   Parainfluenza Virus 1 NOT DETECTED NOT DETECTED Final   Parainfluenza Virus 2 NOT DETECTED NOT DETECTED Final   Parainfluenza Virus 3 NOT DETECTED NOT DETECTED Final   Parainfluenza Virus 4 NOT DETECTED NOT DETECTED Final   Respiratory Syncytial Virus NOT DETECTED NOT DETECTED Final   Bordetella pertussis NOT DETECTED NOT DETECTED Final   Chlamydophila pneumoniae NOT DETECTED NOT DETECTED Final   Mycoplasma pneumoniae NOT DETECTED NOT DETECTED Final    Comment: Performed at [facility], [place] [address] [place], [place] [address]         Radiology Studies: Ct Chest Wo Contrast  Result Date: 10/23/2018 CLINICAL DATA:  Acute respiratory illness. EXAM: CT CHEST WITHOUT CONTRAST TECHNIQUE: Multidetector CT imaging of the chest was performed following the standard protocol without IV contrast. COMPARISON:  Chest radiograph, 10/22/2018 and older exams. FINDINGS: Cardiovascular: Heart is normal in size. There are dense three-vessel coronary artery calcifications. No pericardial  effusion. Great vessels are normal in caliber. Aortic atherosclerosis. Mediastinum/Nodes: No neck base or axillary masses or enlarged lymph nodes. Scattered prominent mediastinal lymph nodes, none pathologically enlarged. No mediastinal or hilar masses. Trachea is patent. Esophagus is unremarkable. Lungs/Pleura: Moderate right pleural effusion. No left pleural effusion. Lungs show upper lobe predominant interstitial thickening with intervening ground-glass opacities. No lung mass or suspicious nodule. Several small calcified granuloma. No pneumothorax. Upper Abdomen: No  acute finding. Musculoskeletal: No fracture or acute finding. No osteoblastic or osteolytic lesions. Chest wall: Prominent bilateral gynecomastia. IMPRESSION: 1. Lungs demonstrate upper lobe predominant interstitial thickening with intervening ground-glass opacities. Differential diagnosis includes pulmonary edema as well as inflammation or atypical infection. 2. Moderate right pleural effusion. 3. Dense coronary artery calcifications.  Aortic atherosclerosis. Aortic Atherosclerosis (ICD10-I70.0). Electronically Signed   By: Lajean Manes M.D.   On: 10/23/2018 17:23        Scheduled Meds: . aspirin EC  325 mg Oral Daily  . azithromycin  500 mg Oral Q24H  . calcium acetate  2,668 mg Oral TID WC  . Chlorhexidine Gluconate Cloth  6 each Topical Q0600  . collagenase   Topical Daily  . lactulose  20 g Oral BID  . [START ON 10/25/2018] metoprolol tartrate  100 mg Oral BID   Continuous Infusions: . sodium chloride 250 mL (10/22/18 1033)  . cefTRIAXone (ROCEPHIN)  IV 1 g (10/23/18 2200)     LOS: 3 days    Time spent: over 30 min    Fayrene Helper, MD [facility] Pager [phone]  If 7PM-7AM, please contact night-coverage [email or web address] Password [facility] 10/24/2018, 6:32 PM

## 2018-10-24 NOTE — Progress Notes (Signed)
Patient started vomiting this evening. 2x   Paged MD  IV Zofran ordered as well as an CT of abdomin .

## 2018-10-24 NOTE — Progress Notes (Signed)
  Vital Signs MEWS/VS Documentation      10/24/2018 1230 10/24/2018 1242 10/24/2018 1252 10/24/2018 1432   MEWS Score:  1  2  2  1    MEWS Score Color:  Green  Yellow  Yellow  Green   Resp:  -  20  20  -   Pulse:  (!) 108  (!) 115  (!) 125  (!) 102   BP:  113/67  134/70  134/70  118/65   Temp:  -  97.6 F (36.4 C)  97.6 F (36.4 C)  -   O2 Device:  -  Nasal Cannula  -  Nasal Cannula   O2 Flow Rate (L/min):  -  2 L/min  -  2 L/min     Patient has A.fib.   MD is aware.  Will continue to monitor       Loreena Valeri L Marveen Donlon 10/24/2018,5:19 PM

## 2018-10-24 NOTE — Consult Note (Addendum)
[facility] Nurse wound consult note Reason for Consult: Consult requested for buttocks and left foot. Wound type: Buttocks and perirectal area are red, painful, and macerated with partial thickness skin loss; appearance is consistent with moisture associated skin damage from frequent stooling.  Pressure Injury POA: This is NOT a pressure injury Measurement: Affected area is approx 10X10X.1cm Left outer foot with full thickness wound; 2X1cm, 100% yellow with small amt yellow drainage, no odor or fluctuance. Dressing procedure/placement/frequency: Barrier cream has not been successful to promote healing, according to the bedside nurse.  Apply skin prep to protect skin and antifungal powder to promote healing and repel moisture with each cleaning episode. Santyl to provide enzymatic debridement of nonviable tissue to left foot.  No family members present to discuss plan of care. Please re-consult if further assistance is needed.  Thank-you,  Julien Girt MSN, [place], [place], [place], [place]

## 2018-10-25 ENCOUNTER — Inpatient Hospital Stay (HOSPITAL_COMMUNITY): Payer: Medicare Other

## 2018-10-25 LAB — GLUCOSE, CAPILLARY
Glucose-Capillary: 89 mg/dL (ref 70–99)
Glucose-Capillary: 85 mg/dL (ref 70–99)
Glucose-Capillary: 90 mg/dL (ref 70–99)
Glucose-Capillary: 139 mg/dL — ABNORMAL HIGH (ref 70–99)

## 2018-10-25 LAB — CBC
HCT: 33.9 % — ABNORMAL LOW (ref 39.0–52.0)
HEMOGLOBIN: 10.3 g/dL — AB (ref 13.0–17.0)
MCH: 31.5 pg (ref 26.0–34.0)
MCHC: 30.4 g/dL (ref 30.0–36.0)
MCV: 103.7 fL — ABNORMAL HIGH (ref 80.0–100.0)
Platelets: 134 10*3/uL — ABNORMAL LOW (ref 150–400)
RBC: 3.27 MIL/uL — ABNORMAL LOW (ref 4.22–5.81)
RDW: 19.1 % — ABNORMAL HIGH (ref 11.5–15.5)
WBC: 10 10*3/uL (ref 4.0–10.5)
nRBC: 0.9 % — ABNORMAL HIGH (ref 0.0–0.2)

## 2018-10-25 LAB — HEPATIC FUNCTION PANEL
ALBUMIN: 3.1 g/dL — AB (ref 3.5–5.0)
ALT: 26 U/L (ref 0–44)
AST: 32 U/L (ref 15–41)
Alkaline Phosphatase: 182 U/L — ABNORMAL HIGH (ref 38–126)
Bilirubin, Direct: 0.4 mg/dL — ABNORMAL HIGH (ref 0.0–0.2)
Indirect Bilirubin: 0.8 mg/dL (ref 0.3–0.9)
TOTAL PROTEIN: 7 g/dL (ref 6.5–8.1)
Total Bilirubin: 1.2 mg/dL (ref 0.3–1.2)

## 2018-10-25 LAB — C-REACTIVE PROTEIN: CRP: 8 mg/dL — AB (ref <1.0)

## 2018-10-25 LAB — CULTURE, BLOOD (ROUTINE X 2)
CULTURE: NO GROWTH
Culture: NO GROWTH
Special Requests: ADEQUATE

## 2018-10-25 LAB — BASIC METABOLIC PANEL
Anion gap: 17 — ABNORMAL HIGH (ref 5–15)
BUN: 33 mg/dL — ABNORMAL HIGH (ref 8–23)
CALCIUM: 9.1 mg/dL (ref 8.9–10.3)
CO2: 24 mmol/L (ref 22–32)
Chloride: 102 mmol/L (ref 98–111)
Creatinine, Ser: 5.58 mg/dL — ABNORMAL HIGH (ref 0.61–1.24)
GFR calc non Af Amer: 9 mL/min — ABNORMAL LOW (ref >60)
GFR, EST AFRICAN AMERICAN: 10 mL/min — AB (ref >60)
Glucose, Bld: 86 mg/dL (ref 70–99)
Potassium: 4.1 mmol/L (ref 3.5–5.1)
SODIUM: 143 mmol/L (ref 135–145)

## 2018-10-25 LAB — SEDIMENTATION RATE: SED RATE: 22 mm/h — AB (ref 0–16)

## 2018-10-25 LAB — AMMONIA: Ammonia: 32 umol/L (ref 9–35)

## 2018-10-25 LAB — MAGNESIUM: Magnesium: 2.2 mg/dL (ref 1.7–2.4)

## 2018-10-25 MED ORDER — SODIUM CHLORIDE 0.9 % IV BOLUS
500.0000 mL | Freq: Once | INTRAVENOUS | Status: AC
  Administered 2018-10-25: 500 mL via INTRAVENOUS

## 2018-10-25 MED ORDER — LIDOCAINE HCL 1 % IJ SOLN
INTRAMUSCULAR | Status: AC
  Filled 2018-10-25: qty 20

## 2018-10-25 NOTE — Progress Notes (Signed)
Dr. Florene Glen on unit.

## 2018-10-25 NOTE — Progress Notes (Signed)
   10/25/18 0310  Vitals  Temp 97.7 F (36.5 C)  Temp Source Oral  BP (!) 78/54  MAP (mmHg) (!) 63  BP Method Automatic  Pulse Rate (!) 101  Oxygen Therapy  SpO2 95 %    Provider paged. Awaiting orders.

## 2018-10-25 NOTE — Progress Notes (Signed)
[facility] ROUNDING NOTE   Subjective:   More interactive this morning.  Thoracentesis planned for pleural space ordered CT scan of abdomen and pelvis left toe revealed no evidence of fracture or osteomyelitis 10/24/2018  Blood pressure 110/77 temperature 98.4 pulse 92  Labs pending hemoglobin 10.3 WBC 10 platelets 134 INR 1.58   Objective:  Vital signs in last 24 hours:  Temp:  [97.6 F (36.4 C)-98.4 F (36.9 C)] 98.4 F (36.9 C) (10/29 0805) Pulse Rate:  [80-125] 92 (10/29 0805) Resp:  [18-20] 18 (10/29 0805) BP: (78-138)/(50-77) 110/77 (10/29 0805) SpO2:  [95 %-100 %] 98 % (10/29 0411) FiO2 (%):  [2 %] 2 % (10/29 0411) Weight:  [79 kg-79.4 kg] 79 kg (10/29 0411)  Weight change: 11.3 kg Filed Weights   10/24/18 0840 10/24/18 1242 10/25/18 0411  Weight: 83.9 kg 79.4 kg 79 kg    Intake/Output: I/O last 3 completed shifts: In: 528.3 [P.O.:60; I.V.:18.3; IV Piggyback:450] Out: 3903 [QIWLN:[id number]]   Intake/Output this shift:  No intake/output data recorded.  CVS-tachycardic RS- CTA slightly diminished at base ABD- BS present soft non-distended EXT- no edema left upper extremity AV fistula with thrill and bruit   Basic Metabolic Panel: Recent Labs  Lab 10/20/18 1645 10/21/18 0520 10/22/18 0606 10/23/18 0637 10/24/18 0614 10/25/18 0759  NA 143 145 141 142 142  --   K 3.5 3.6 5.3* 4.7 5.8*  --   CL 101 101 103 103 103  --   CO2 26 28 21* 24 23  --   GLUCOSE 75 74 88 89 113*  --   BUN 37* 42* 29* 43* 66*  --   CREATININE 5.10* 5.78* 4.22* 6.10* 7.80*  --   CALCIUM 9.5 9.6 9.1 9.8 9.8  --   MG  --   --  2.2  --  2.5* 2.2  PHOS  --  4.2  --  4.2  --   --     Liver Function Tests: Recent Labs  Lab 10/20/18 1645  10/21/18 1200 10/22/18 0606 10/23/18 0637 10/24/18 0614 10/25/18 0759  AST 28  --  25 47*  --  30 32  ALT 24  --  24 33  --  26 26  ALKPHOS 224*  --  205* 231*  --  206* 182*  BILITOT 1.2  --  1.2 1.9*  --  1.7* 1.2  PROT 6.7   --  6.6 6.9  --  6.9 7.0  ALBUMIN 3.2*   < > 3.0* 3.3* 3.2* 3.1* 3.1*   < > = values in this interval not displayed.   Recent Labs  Lab 10/21/18 0520 10/24/18 0614  LIPASE 35 32   Recent Labs  Lab 10/21/18 1200 10/22/18 1228 10/25/18 0759  AMMONIA 75* 42* 32    CBC: Recent Labs  Lab 10/20/18 1645 10/21/18 0520 10/22/18 0606 10/23/18 0637 10/24/18 0614 10/25/18 0759  WBC 6.8 8.6 8.4 8.2 10.7* 10.0  NEUTROABS 4.3  --   --   --   --   --   HGB 9.1* 9.3* 9.7* 9.8* 10.1* 10.3*  HCT 30.0* 31.5* 32.0* 33.5* 33.7* 33.9*  MCV 106.4* 104.7* 105.6* 107.7* 105.0* 103.7*  PLT 140* 143* 131* 127* 128* 134*    Cardiac Enzymes: No results for input(s): CKTOTAL, CKMB, CKMBINDEX, TROPONINI in the last 168 hours.  BNP: Invalid input(s): POCBNP  CBG: Recent Labs  Lab 10/24/18 0752 10/24/18 1237 10/24/18 1725 10/24/18 2054 10/25/18 0734  GLUCAP 99 89 218* 121*  89    Microbiology: Results for orders placed or performed during the hospital encounter of 10/20/18  Blood Culture (routine x 2)     Status: None (Preliminary result)   Collection Time: 10/20/18  5:10 PM  Result Value Ref Range Status   Specimen Description BLOOD RIGHT WRIST  Final   Special Requests   Final    BOTTLES DRAWN AEROBIC AND ANAEROBIC Blood Culture adequate volume   Culture   Final    NO GROWTH 4 DAYS Performed at [facility], [place] [address], [place], [place] [address]    Report Status PENDING  Incomplete  Blood Culture (routine x 2)     Status: None (Preliminary result)   Collection Time: 10/20/18  5:10 PM  Result Value Ref Range Status   Specimen Description BLOOD RIGHT HAND  Final   Special Requests   Final    BOTTLES DRAWN AEROBIC AND ANAEROBIC Blood Culture results may not be optimal due to an inadequate volume of blood received in culture bottles   Culture   Final    NO GROWTH 4 DAYS Performed at [facility], [place] [address] [place], [place] [address]    Report Status  PENDING  Incomplete  MRSA PCR Screening     Status: None   Collection Time: 10/21/18  3:49 AM  Result Value Ref Range Status   MRSA by PCR NEGATIVE NEGATIVE Final    Comment:        The GeneXpert MRSA Assay (FDA approved for NASAL specimens only), is one component of a comprehensive MRSA colonization surveillance program. It is not intended to diagnose MRSA infection nor to guide or monitor treatment for MRSA infections. Performed at [facility], [place] [address], [place], [place] [address]   Respiratory Panel by PCR     Status: None   Collection Time: 10/22/18  5:03 PM  Result Value Ref Range Status   Adenovirus NOT DETECTED NOT DETECTED Final   Coronavirus 229E NOT DETECTED NOT DETECTED Final   Coronavirus HKU1 NOT DETECTED NOT DETECTED Final   Coronavirus NL63 NOT DETECTED NOT DETECTED Final   Coronavirus OC43 NOT DETECTED NOT DETECTED Final   Metapneumovirus NOT DETECTED NOT DETECTED Final   Rhinovirus / Enterovirus NOT DETECTED NOT DETECTED Final   Influenza A NOT DETECTED NOT DETECTED Final   Influenza B NOT DETECTED NOT DETECTED Final   Parainfluenza Virus 1 NOT DETECTED NOT DETECTED Final   Parainfluenza Virus 2 NOT DETECTED NOT DETECTED Final   Parainfluenza Virus 3 NOT DETECTED NOT DETECTED Final   Parainfluenza Virus 4 NOT DETECTED NOT DETECTED Final   Respiratory Syncytial Virus NOT DETECTED NOT DETECTED Final   Bordetella pertussis NOT DETECTED NOT DETECTED Final   Chlamydophila pneumoniae NOT DETECTED NOT DETECTED Final   Mycoplasma pneumoniae NOT DETECTED NOT DETECTED Final    Comment: Performed at [facility], [place] [address], [place], [place] [address]    Coagulation Studies: Recent Labs    10/24/18 0614  LABPROT 18.7*  INR 1.58    Urinalysis: No results for input(s): COLORURINE, LABSPEC, PHURINE, GLUCOSEU, HGBUR, BILIRUBINUR, KETONESUR, PROTEINUR, UROBILINOGEN, NITRITE, LEUKOCYTESUR in the last 72 hours.  Invalid input(s): APPERANCEUR     Imaging: Ct Chest Wo Contrast  Result Date: 10/23/2018 CLINICAL DATA:  Acute respiratory illness. EXAM: CT CHEST WITHOUT CONTRAST TECHNIQUE: Multidetector CT imaging of the chest was performed following the standard protocol without IV contrast. COMPARISON:  Chest radiograph, 10/22/2018 and older exams. FINDINGS:  Cardiovascular: Heart is normal in size. There are dense three-vessel coronary artery calcifications. No pericardial effusion. Great vessels are normal in caliber. Aortic atherosclerosis. Mediastinum/Nodes: No neck base or axillary masses or enlarged lymph nodes. Scattered prominent mediastinal lymph nodes, none pathologically enlarged. No mediastinal or hilar masses. Trachea is patent. Esophagus is unremarkable. Lungs/Pleura: Moderate right pleural effusion. No left pleural effusion. Lungs show upper lobe predominant interstitial thickening with intervening ground-glass opacities. No lung mass or suspicious nodule. Several small calcified granuloma. No pneumothorax. Upper Abdomen: No acute finding. Musculoskeletal: No fracture or acute finding. No osteoblastic or osteolytic lesions. Chest wall: Prominent bilateral gynecomastia. IMPRESSION: 1. Lungs demonstrate upper lobe predominant interstitial thickening with intervening ground-glass opacities. Differential diagnosis includes pulmonary edema as well as inflammation or atypical infection. 2. Moderate right pleural effusion. 3. Dense coronary artery calcifications.  Aortic atherosclerosis. Aortic Atherosclerosis (ICD10-I70.0). Electronically Signed   By: Lajean Manes M.D.   On: 10/23/2018 17:23   Dg Toe 5th Left  Result Date: 10/24/2018 CLINICAL DATA:  Open wound on toe.  Diabetic. EXAM: DG TOE 5TH LEFT COMPARISON:  None. FINDINGS: Mild irregularity of the base of the fifth proximal phalanx. No bone destruction to suggest osteomyelitis. No definite fracture identified. No air within the soft tissues. IMPRESSION: No evidence of fracture or  osteomyelitis. Electronically Signed   By: Marin Olp M.D.   On: 10/24/2018 19:25     Medications:   . sodium chloride 250 mL (10/22/18 1033)  . azithromycin 500 mg (10/24/18 2221)  . cefTRIAXone (ROCEPHIN)  IV 1 g (10/24/18 2126)   . aspirin EC  325 mg Oral Daily  . calcium acetate  2,668 mg Oral TID WC  . Chlorhexidine Gluconate Cloth  6 each Topical Q0600  . collagenase   Topical Daily  . lidocaine      . metoprolol tartrate  100 mg Oral BID   sodium chloride, acetaminophen, hydrOXYzine, ipratropium-albuterol, loperamide, ondansetron (ZOFRAN) IV  Assessment/ Plan:  1. AMS - BC's no growth for 3 days. Lactic acid improved to 1.5. Ammonia elevated, 75. Negative CT head and CXR. Abdominal USunrevealing. On ABX. Per primary. Plan CT scan abdomen and pelvis for evaluation of cirrhosis due to low platelets increased INR and high ammonia level 2. Acute hypoxic respiratory failure- on 2 L nasal cannula.  Will challenge edw tomorrow with HD but may be related to PNA.  Will follow after UF tomorrow. CT chest reveals moderate right pleural effusion upper lobe predominant interstitial thickening with interval groundglass opacities consistent with pulmonary edema or inflammation or atypical infection.  Thoracentesis planned 10/25/2018 recommend cytology to rule out neoplasm 3. ESRD - On HD MWF. Tolerated HD well on 10/24/2018 Plan to keep on schedule.Removed 3.8 L with dialysis will continue to challenge with dialysis for fluid removal 4. Hypertension/volume - BP improved.  Continue to challenge dry weight 5. Anemia of CKD - Hgb 10.1 6. Secondary Hyperparathyroidism - Ca and phos at goal. Continue VDRA and binders. 7. Nutrition - Renal diet with fluid restrictions. Nepro.  8. DMT2 - per primary 9. Diastolic dysfunction/aortic stenosis 10. OSA 11. A fib - not on anticoagulation 12. Hx GIB 2/2 coumadin    LOS: 4 Sherril Croon @TODAY @9 :28 AM

## 2018-10-25 NOTE — Progress Notes (Signed)
PROGRESS NOTE    [name]  MRN:[id number] DOB: 04/16/1941 DOA: 10/20/2018 PCP: Patient, No Pcp Per   Brief Narrative: Per HPI [name] is a 77 y.o. male with medical history significant of end-stage renal disease on hemodialysis Monday Wednesday Friday, A. fib, CHF, diabetes, hypertension, hyperlipidemia, OSA presenting to the hospital from his nursing home for further evaluation of altered mental status. Per ED documentation, patient was brought in by EMS who reported that he was altered, found in his facility slumped over in his wheelchair.  On arrival, blood pressure 60/40.  He received her normal dialysis session yesterday.  He presented with hypotension and AMS concerning for sepsis.  He was started on broad spectrum antibiotics.  He was noted to have an oxygen requirement and had a CT concerning for edema vs inflammation or atypical infection.  He was started on coverage for CAP.  The CT was also notable for a moderate R sided effusion.  IR was consulted, but there was not enough to tap.  He was also noted to have an elevated ammonia, but he's had intermittent diarrhea prior to presentation and worsening diarrhea since lactlose was started.  He continues to wax and wane with his mental status.  Currently awaiting CT abdomen/pelvis.  Discharge pending further improvement in mental status.    Assessment & Plan:   Principal Problem:   Sepsis (HCC) Active Problems:   Type 2 diabetes mellitus (HCC)   OBSTRUCTIVE SLEEP APNEA   PAF (paroxysmal atrial fibrillation) (HCC)   CHF (congestive heart failure) (HCC)   Hyperlipidemia   End stage renal disease (HCC)   Acute encephalopathy   Elevated alkaline phosphatase level   Chronic anemia   Thrombocytopenia (HCC)  Acute Hypoxic Respiratory Failure:  O2 requirement increased on 10/27, requiring 4 L by [place] -> improved today to 2 L by [place] [facility] with low pO2 CT cheest with interstitial thickening with intervening ground glass  opacities (consider pulmonary edema vs inflammation or atypical infection) Appreciate nephrology who is challenging with nephrology for fluid removal As pt has been delirious, supports infection, he was started on CAP treatment (negative MRSA PCR and RVP). Started on CAP coverage with ceftriaxone/azithromycin.  Sputum cx if able.  Urine strep, legionella if able.  Blood cx NGTD.  Elevated Lactate  Hypotension  Concern for sepsis -Afebrile, normal WBC count (although elevated 10/28).  Intermittent tachycardia/tachypnea.  -Treating possible pneumonia as noted above - pt also with wound to L 5th toe, negative plain films - follow cultures (bcx NGTD x5) - follow urinalysis if able - follow ESR/CRP - Recurrent hypotension overnight, possibly in setting of ultrafiltration with dialysis yesterday?  Acute encephalopathy - Waxing/waning.  A&Ox3 again for me today, but he continues to wax and wane with some lethargy and somnolence at times.   - Labs notable for elevated ammonia, but pt now with frequent diarrhea, holding lactulose -> follow ammonia tomorrow - abx as above for possible pneumonia, follow x ray of L 5th toe (negative) - blood gases without evidence of hypercarbia - Normal B12, HFP with elevated alk phos and mildly elevated bilirubin  Elevated Ammonia: US without findings c/w cirrhosis, but he does have thrombocytopenia, low albumin, elevated INR.  He was started on lactulose for concern this was causing AMS, but stopped due to worsening diarrhea.  Nausea  Vomiting: pt developed copious vomiting on the evening of 10/28.  C/o abdominal discomfort with this.  CT pending.  Diarrhea  Buttocks Wound: after starting lactulose, pt   with copious diarrhea and moisture associated skin damage.  Per HPI, some chronicity to diarrhea.  Lactulose currently on hold and imodium ordered.  Wound c/s, appreciate recs.  Elevated alkaline phosphatase RUQ US unremarkable Continue to monitor  R  pleural Effusion:  IR c/s for thoracentesis - per nursing, no tapable fluid (no note from IR yet)  ESRD on hemodialysis Monday Wednesday Friday -Appreciate renal -Renal function panel -Continue renal meds  Paroxysmal A. Fib  RVR - With some persistent mild RVR again today, will transition to 100 metop BID (watch with soft BP recently) -CHA2DS2VASc 5.  Currently rate controlled. -Continue home metoprolol.  BP stable. - need to follow up with family why no anticoagulation - unable to reach today  Chronic diastolic CHF -Stable.    Diabetes type 2 -Check A1c 4.2, d/c ssi  Hypertension -Continue home metoprolol (watch with soft BP's recently)  OSA -CPAP at night  Chronic anemia -Relatively stable  -Continue to monitor  Thrombocytopenia -continue to monitor  DVT prophylaxis: heparin  Code Status: full  Family Communication: called duaghter, no answer Disposition Plan: pending improvement   Consultants:   nephrology  Procedures:   none  Antimicrobials:  Anti-infectives (From admission, onward)   Start     Dose/Rate Route Frequency Ordered Stop   10/24/18 2000  azithromycin (ZITHROMAX) 500 mg in sodium chloride 0.9 % 250 mL IVPB     500 mg 250 mL/hr over 60 Minutes Intravenous Every 24 hours 10/24/18 1843 10/30/18 1959   10/23/18 2000  cefTRIAXone (ROCEPHIN) 1 g in sodium chloride 0.9 % 100 mL IVPB     1 g 200 mL/hr over 30 Minutes Intravenous Every 24 hours 10/23/18 1902 10/30/18 1959   10/23/18 2000  azithromycin (ZITHROMAX) tablet 500 mg  Status:  Discontinued     500 mg Oral Every 24 hours 10/23/18 1902 10/24/18 1843   10/22/18 1800  cefTRIAXone (ROCEPHIN) 1 g in sodium chloride 0.9 % 100 mL IVPB  Status:  Discontinued     1 g 200 mL/hr over 30 Minutes Intravenous Every 24 hours 10/22/18 1725 10/22/18 1735   10/22/18 1800  azithromycin (ZITHROMAX) tablet 500 mg  Status:  Discontinued     500 mg Oral Every 24 hours 10/22/18 1725 10/22/18 1735    10/21/18 1200  ceFEPIme (MAXIPIME) 2 g in sodium chloride 0.9 % 100 mL IVPB  Status:  Discontinued     2 g 200 mL/hr over 30 Minutes Intravenous Every M-W-F (Hemodialysis) 10/20/18 1710 10/22/18 1423   10/21/18 1200  vancomycin (VANCOCIN) IVPB 750 mg/150 ml premix  Status:  Discontinued     750 mg 150 mL/hr over 60 Minutes Intravenous Every M-W-F (Hemodialysis) 10/20/18 1710 10/21/18 1150   10/20/18 1715  vancomycin (VANCOCIN) 1,500 mg in sodium chloride 0.9 % 500 mL IVPB     1,500 mg 250 mL/hr over 120 Minutes Intravenous  Once 10/20/18 1652 10/20/18 2024   10/20/18 1645  ceFEPIme (MAXIPIME) 2 g in sodium chloride 0.9 % 100 mL IVPB     2 g 200 mL/hr over 30 Minutes Intravenous  Once 10/20/18 1643 10/20/18 1758   10/20/18 1645  metroNIDAZOLE (FLAGYL) IVPB 500 mg  Status:  Discontinued     500 mg 100 mL/hr over 60 Minutes Intravenous Every 8 hours 10/20/18 1643 10/22/18 1431   10/20/18 1645  vancomycin (VANCOCIN) IVPB 1000 mg/200 mL premix  Status:  Discontinued     1,000 mg 200 mL/hr over 60 Minutes Intravenous  Once 10/20/18 1643 10/20/18  1652      Subjective: A&Ox3. Sleepy. Doesn't remember me, but says there's a Dr. Florene Glen at the kidney center. Appropriate.  Objective: Vitals:   10/25/18 0411 10/25/18 0416 10/25/18 0436 10/25/18 0805  BP: 112/64 92/61 (!) 96/55 110/77  Pulse: 80 91 92 92  Resp: 18   18  Temp: 98.2 F (36.8 C)   98.4 F (36.9 C)  TempSrc: Oral   Oral  SpO2: 98%     Weight: 79 kg     Height:        Intake/Output Summary (Last 24 hours) at 10/25/2018 0813 Last data filed at 10/25/2018 0328 Gross per 24 hour  Intake 350 ml  Output 3903 ml  Net -3553 ml   Filed Weights   10/24/18 0840 10/24/18 1242 10/25/18 0411  Weight: 83.9 kg 79.4 kg 79 kg    Examination:  General: No acute distress. Cardiovascular: irregularly irregular Lungs: Clear to auscultation bilaterally Abdomen: Soft, nontender, nondistended  Neurological: Alert and oriented 3, a  little sleepy, but improved. Moves all extremities 4. Cranial nerves II through XII grossly intact. Skin: Warm and dry. No rashes or lesions. Extremities: No clubbing or cyanosis. No edema. LLE wrapped. Psychiatric: Mood and affect are normal. Insight and judgment are appropriate.   Data Reviewed: I have personally reviewed following labs and imaging studies  CBC: Recent Labs  Lab 10/20/18 1645 10/21/18 0520 10/22/18 0606 10/23/18 0637 10/24/18 0614  WBC 6.8 8.6 8.4 8.2 10.7*  NEUTROABS 4.3  --   --   --   --   HGB 9.1* 9.3* 9.7* 9.8* 10.1*  HCT 30.0* 31.5* 32.0* 33.5* 33.7*  MCV 106.4* 104.7* 105.6* 107.7* 105.0*  PLT 140* 143* 131* 127* 637*   Basic Metabolic Panel: Recent Labs  Lab 10/20/18 1645 10/21/18 0520 10/22/18 0606 10/23/18 0637 10/24/18 0614  NA 143 145 141 142 142  K 3.5 3.6 5.3* 4.7 5.8*  CL 101 101 103 103 103  CO2 26 28 21* 24 23  GLUCOSE 75 74 88 89 113*  BUN 37* 42* 29* 43* 66*  CREATININE 5.10* 5.78* 4.22* 6.10* 7.80*  CALCIUM 9.5 9.6 9.1 9.8 9.8  MG  --   --  2.2  --  2.5*  PHOS  --  4.2  --  4.2  --    GFR: Estimated Creatinine Clearance: 7.8 mL/min (A) (by C-G formula based on SCr of 7.8 mg/dL (H)). Liver Function Tests: Recent Labs  Lab 10/20/18 1645 10/21/18 0520 10/21/18 1200 10/22/18 0606 10/23/18 0637 10/24/18 0614  AST 28  --  25 47*  --  30  ALT 24  --  24 33  --  26  ALKPHOS 224*  --  205* 231*  --  206*  BILITOT 1.2  --  1.2 1.9*  --  1.7*  PROT 6.7  --  6.6 6.9  --  6.9  ALBUMIN 3.2* 3.2* 3.0* 3.3* 3.2* 3.1*   Recent Labs  Lab 10/21/18 0520 10/24/18 0614  LIPASE 35 32   Recent Labs  Lab 10/21/18 1200 10/22/18 1228  AMMONIA 75* 42*   Coagulation Profile: Recent Labs  Lab 10/22/18 0606 10/24/18 0614  INR 1.52 1.58   Cardiac Enzymes: No results for input(s): CKTOTAL, CKMB, CKMBINDEX, TROPONINI in the last 168 hours. BNP (last 3 results) No results for input(s): PROBNP in the last 8760 hours. HbA1C: No  results for input(s): HGBA1C in the last 72 hours. CBG: Recent Labs  Lab 10/24/18 0752 10/24/18 1237 10/24/18  1725 10/24/18 2054 10/25/18 0734  GLUCAP 99 89 218* 121* 89   Lipid Profile: No results for input(s): CHOL, HDL, LDLCALC, TRIG, CHOLHDL, LDLDIRECT in the last 72 hours. Thyroid Function Tests: No results for input(s): TSH, T4TOTAL, FREET4, T3FREE, THYROIDAB in the last 72 hours. Anemia Panel: No results for input(s): VITAMINB12, FOLATE, FERRITIN, TIBC, IRON, RETICCTPCT in the last 72 hours. Sepsis Labs: Recent Labs  Lab 10/20/18 1630 10/20/18 1907 10/21/18 0520 10/21/18 9030 10/22/18 0606 10/23/18 0923  PROCALCITON  --   --   --  0.96 0.99 1.15  LATICACIDVEN 3.30* 2.50* 1.9 1.5  --   --     Recent Results (from the past 240 hour(s))  Blood Culture (routine x 2)     Status: None (Preliminary result)   Collection Time: 10/20/18  5:10 PM  Result Value Ref Range Status   Specimen Description BLOOD RIGHT WRIST  Final   Special Requests   Final    BOTTLES DRAWN AEROBIC AND ANAEROBIC Blood Culture adequate volume   Culture   Final    NO GROWTH 4 DAYS Performed at [facility], [place] [address], [place], [place] [address]    Report Status PENDING  Incomplete  Blood Culture (routine x 2)     Status: None (Preliminary result)   Collection Time: 10/20/18  5:10 PM  Result Value Ref Range Status   Specimen Description BLOOD RIGHT HAND  Final   Special Requests   Final    BOTTLES DRAWN AEROBIC AND ANAEROBIC Blood Culture results may not be optimal due to an inadequate volume of blood received in culture bottles   Culture   Final    NO GROWTH 4 DAYS Performed at [facility], [place] [address] [place], [place] [address]    Report Status PENDING  Incomplete  MRSA PCR Screening     Status: None   Collection Time: 10/21/18  3:49 AM  Result Value Ref Range Status   MRSA by PCR NEGATIVE NEGATIVE Final    Comment:        The GeneXpert MRSA Assay (FDA approved for  NASAL specimens only), is one component of a comprehensive MRSA colonization surveillance program. It is not intended to diagnose MRSA infection nor to guide or monitor treatment for MRSA infections. Performed at [facility], [place] [address] [place], [place] [address]   Respiratory Panel by PCR     Status: None   Collection Time: 10/22/18  5:03 PM  Result Value Ref Range Status   Adenovirus NOT DETECTED NOT DETECTED Final   Coronavirus 229E NOT DETECTED NOT DETECTED Final   Coronavirus HKU1 NOT DETECTED NOT DETECTED Final   Coronavirus NL63 NOT DETECTED NOT DETECTED Final   Coronavirus OC43 NOT DETECTED NOT DETECTED Final   Metapneumovirus NOT DETECTED NOT DETECTED Final   Rhinovirus / Enterovirus NOT DETECTED NOT DETECTED Final   Influenza A NOT DETECTED NOT DETECTED Final   Influenza B NOT DETECTED NOT DETECTED Final   Parainfluenza Virus 1 NOT DETECTED NOT DETECTED Final   Parainfluenza Virus 2 NOT DETECTED NOT DETECTED Final   Parainfluenza Virus 3 NOT DETECTED NOT DETECTED Final   Parainfluenza Virus 4 NOT DETECTED NOT DETECTED Final   Respiratory Syncytial Virus NOT DETECTED NOT DETECTED Final   Bordetella pertussis NOT DETECTED NOT DETECTED Final   Chlamydophila pneumoniae NOT DETECTED NOT DETECTED Final   Mycoplasma pneumoniae NOT DETECTED NOT DETECTED Final    Comment: Performed at [facility],  [address], [place], [place] [address]         Radiology Studies: Ct Chest Wo Contrast  Result Date: 10/23/2018 CLINICAL DATA:  Acute respiratory illness. EXAM: CT CHEST WITHOUT CONTRAST TECHNIQUE: Multidetector CT imaging of the chest was performed following the standard protocol without IV contrast. COMPARISON:  Chest radiograph, 10/22/2018 and older exams. FINDINGS: Cardiovascular: Heart is normal in size. There are dense three-vessel coronary artery calcifications. No pericardial effusion. Great vessels are normal in caliber. Aortic atherosclerosis.  Mediastinum/Nodes: No neck base or axillary masses or enlarged lymph nodes. Scattered prominent mediastinal lymph nodes, none pathologically enlarged. No mediastinal or hilar masses. Trachea is patent. Esophagus is unremarkable. Lungs/Pleura: Moderate right pleural effusion. No left pleural effusion. Lungs show upper lobe predominant interstitial thickening with intervening ground-glass opacities. No lung mass or suspicious nodule. Several small calcified granuloma. No pneumothorax. Upper Abdomen: No acute finding. Musculoskeletal: No fracture or acute finding. No osteoblastic or osteolytic lesions. Chest wall: Prominent bilateral gynecomastia. IMPRESSION: 1. Lungs demonstrate upper lobe predominant interstitial thickening with intervening ground-glass opacities. Differential diagnosis includes pulmonary edema as well as inflammation or atypical infection. 2. Moderate right pleural effusion. 3. Dense coronary artery calcifications.  Aortic atherosclerosis. Aortic Atherosclerosis (ICD10-I70.0). Electronically Signed   By: Lajean Manes M.D.   On: 10/23/2018 17:23   Dg Toe 5th Left  Result Date: 10/24/2018 CLINICAL DATA:  Open wound on toe.  Diabetic. EXAM: DG TOE 5TH LEFT COMPARISON:  None. FINDINGS: Mild irregularity of the base of the fifth proximal phalanx. No bone destruction to suggest osteomyelitis. No definite fracture identified. No air within the soft tissues. IMPRESSION: No evidence of fracture or osteomyelitis. Electronically Signed   By: Marin Olp M.D.   On: 10/24/2018 19:25        Scheduled Meds: . aspirin EC  325 mg Oral Daily  . calcium acetate  2,668 mg Oral TID WC  . Chlorhexidine Gluconate Cloth  6 each Topical Q0600  . collagenase   Topical Daily  . metoprolol tartrate  100 mg Oral BID   Continuous Infusions: . sodium chloride 250 mL (10/22/18 1033)  . azithromycin 500 mg (10/24/18 2221)  . cefTRIAXone (ROCEPHIN)  IV 1 g (10/24/18 2126)     LOS: 4 days    Time spent:  over 30 min    Fayrene Helper, MD [facility] Pager [phone]  If 7PM-7AM, please contact night-coverage [email or web address] Password [facility] 10/25/2018, 8:13 AM

## 2018-10-25 NOTE — Plan of Care (Signed)
  Problem: Activity: Goal: Risk for activity intolerance will decrease Outcome: Progressing   Problem: Nutrition: Goal: Adequate nutrition will be maintained Outcome: Progressing   Problem: Coping: Goal: Level of anxiety will decrease Outcome: Progressing   Problem: Pain Managment: Goal: General experience of comfort will improve Outcome: Progressing   

## 2018-10-25 NOTE — Progress Notes (Signed)
Patient's daughter returned phone call from unit. Informed her of fall. Daughter stated he has been falling frequently at home. Stated she would come to see patient tomorrow.

## 2018-10-25 NOTE — Progress Notes (Signed)
Dr. Florene Glen notified to call unit regarding recent fall.

## 2018-10-26 DIAGNOSIS — D649 Anemia, unspecified: Secondary | ICD-10-CM

## 2018-10-26 DIAGNOSIS — A419 Sepsis, unspecified organism: Principal | ICD-10-CM

## 2018-10-26 DIAGNOSIS — N186 End stage renal disease: Secondary | ICD-10-CM

## 2018-10-26 DIAGNOSIS — G9340 Encephalopathy, unspecified: Secondary | ICD-10-CM

## 2018-10-26 LAB — RENAL FUNCTION PANEL
Albumin: 2.8 g/dL — ABNORMAL LOW (ref 3.5–5.0)
Anion gap: 12 (ref 5–15)
BUN: 57 mg/dL — ABNORMAL HIGH (ref 8–23)
CALCIUM: 9.1 mg/dL (ref 8.9–10.3)
CHLORIDE: 97 mmol/L — AB (ref 98–111)
CO2: 29 mmol/L (ref 22–32)
Creatinine, Ser: 7.79 mg/dL — ABNORMAL HIGH (ref 0.61–1.24)
GFR, EST AFRICAN AMERICAN: 7 mL/min — AB (ref >60)
GFR, EST NON AFRICAN AMERICAN: 6 mL/min — AB (ref >60)
Glucose, Bld: 134 mg/dL — ABNORMAL HIGH (ref 70–99)
Phosphorus: 5.5 mg/dL — ABNORMAL HIGH (ref 2.5–4.6)
Potassium: 5.1 mmol/L (ref 3.5–5.1)
SODIUM: 138 mmol/L (ref 135–145)

## 2018-10-26 LAB — HEPATIC FUNCTION PANEL
ALK PHOS: 170 U/L — AB (ref 38–126)
ALT: 22 U/L (ref 0–44)
AST: 24 U/L (ref 15–41)
Albumin: 3 g/dL — ABNORMAL LOW (ref 3.5–5.0)
BILIRUBIN DIRECT: 0.3 mg/dL — AB (ref 0.0–0.2)
BILIRUBIN TOTAL: 1.1 mg/dL (ref 0.3–1.2)
Indirect Bilirubin: 0.8 mg/dL (ref 0.3–0.9)
Total Protein: 6.8 g/dL (ref 6.5–8.1)

## 2018-10-26 LAB — CBC
HCT: 33.4 % — ABNORMAL LOW (ref 39.0–52.0)
HEMATOCRIT: 33.4 % — AB (ref 39.0–52.0)
Hemoglobin: 10.3 g/dL — ABNORMAL LOW (ref 13.0–17.0)
Hemoglobin: 10 g/dL — ABNORMAL LOW (ref 13.0–17.0)
MCH: 32.7 pg (ref 26.0–34.0)
MCH: 31.9 pg (ref 26.0–34.0)
MCHC: 30.8 g/dL (ref 30.0–36.0)
MCHC: 29.9 g/dL — ABNORMAL LOW (ref 30.0–36.0)
MCV: 106 fL — AB (ref 80.0–100.0)
MCV: 106.7 fL — AB (ref 80.0–100.0)
NRBC: 1.6 % — AB (ref 0.0–0.2)
Platelets: 121 10*3/uL — ABNORMAL LOW (ref 150–400)
Platelets: 131 10*3/uL — ABNORMAL LOW (ref 150–400)
RBC: 3.15 MIL/uL — ABNORMAL LOW (ref 4.22–5.81)
RBC: 3.13 MIL/uL — AB (ref 4.22–5.81)
RDW: 19.1 % — ABNORMAL HIGH (ref 11.5–15.5)
RDW: 19 % — ABNORMAL HIGH (ref 11.5–15.5)
WBC: 8.5 10*3/uL (ref 4.0–10.5)
WBC: 9.9 10*3/uL (ref 4.0–10.5)
nRBC: 1.4 % — ABNORMAL HIGH (ref 0.0–0.2)

## 2018-10-26 LAB — BASIC METABOLIC PANEL
Anion gap: 15 (ref 5–15)
BUN: 48 mg/dL — ABNORMAL HIGH (ref 8–23)
CALCIUM: 9.2 mg/dL (ref 8.9–10.3)
CHLORIDE: 102 mmol/L (ref 98–111)
CO2: 26 mmol/L (ref 22–32)
CREATININE: 6.97 mg/dL — AB (ref 0.61–1.24)
GFR calc Af Amer: 8 mL/min — ABNORMAL LOW (ref >60)
GFR calc non Af Amer: 7 mL/min — ABNORMAL LOW (ref >60)
Glucose, Bld: 90 mg/dL (ref 70–99)
Potassium: 4.2 mmol/L (ref 3.5–5.1)
Sodium: 143 mmol/L (ref 135–145)

## 2018-10-26 LAB — GLUCOSE, CAPILLARY
GLUCOSE-CAPILLARY: 179 mg/dL — AB (ref 70–99)
Glucose-Capillary: 107 mg/dL — ABNORMAL HIGH (ref 70–99)
Glucose-Capillary: 124 mg/dL — ABNORMAL HIGH (ref 70–99)

## 2018-10-26 LAB — AMMONIA: Ammonia: 42 umol/L — ABNORMAL HIGH (ref 9–35)

## 2018-10-26 LAB — MAGNESIUM: Magnesium: 2.3 mg/dL (ref 1.7–2.4)

## 2018-10-26 MED ORDER — ALTEPLASE 2 MG IJ SOLR: 2.0000 mg | Freq: Once | INTRAMUSCULAR | Status: DC | PRN

## 2018-10-26 MED ORDER — AZITHROMYCIN 500 MG PO TABS
500.0000 mg | ORAL_TABLET | Freq: Every day | ORAL | Status: DC
  Administered 2018-10-26 – 2018-10-28 (×3): 500 mg via ORAL
  Filled 2018-10-26 (×3): qty 1

## 2018-10-26 NOTE — Progress Notes (Signed)
PHARMACIST - PHYSICIAN COMMUNICATION DR:   TRH CONCERNING: Antibiotic IV to Oral Route Change Policy  RECOMMENDATION: This patient is receiving azithromycin by the intravenous route.  Based on criteria approved by the Pharmacy and Therapeutics Committee, the antibiotic(s) is/are being converted to the equivalent oral dose form(s).   DESCRIPTION: These criteria include:  Patient being treated for a respiratory tract infection, urinary tract infection, cellulitis or clostridium difficile associated diarrhea if on metronidazole  The patient is not neutropenic and does not exhibit a GI malabsorption state  The patient is eating (either orally or via tube) and/or has been taking other orally administered medications for a least 24 hours  The patient is improving clinically and has a Tmax < 100.5  If you have questions about this conversion, please contact the Pharmacy Department  []   [phone] )  [facility] []   [phone] )  [facility] [x]   [phone] )  [facility] []   [phone] )  [facility] []   [phone] )  [facility]

## 2018-10-26 NOTE — Progress Notes (Signed)
Attempted to wean pt off oxygen. Pt O2 saturation dropped to 83. Pt placed back on 2 L. O2 back to 95%

## 2018-10-26 NOTE — Progress Notes (Signed)
Patient refuses CPAP HS tonight. 

## 2018-10-26 NOTE — Plan of Care (Signed)
  Problem: Education: Goal: Knowledge of General Education information will improve Description: Including pain rating scale, medication(s)/side effects and non-pharmacologic comfort measures Outcome: Progressing   Problem: Activity: Goal: Risk for activity intolerance will decrease Outcome: Progressing   

## 2018-10-26 NOTE — Progress Notes (Signed)
Pt refusing CPAP for the night. RT will continue to monitor as needed.  

## 2018-10-26 NOTE — Progress Notes (Signed)
[facility] ROUNDING NOTE   Subjective:   Comfortable this morning still wearing oxygen ultrasound examination of pleural fluid did not reveal sufficient fluid for aspiration.  CT scan of abdomen and pelvis due to concerns of cirrhosis, left toe revealed no evidence of fracture or osteomyelitis 10/24/2018 dialysis planned later today  Blood pressure 88/58 pulse 94 temperature 97.7 O2 sats 96% room air transition to 2 L nasal cannula due to desaturation to 88% see nurse's note  Sodium 143 potassium 4.2 chloride 102 CO2 26 BUN 48 creatinine 6.97 calcium 9.2 magnesium 2.3 albumin 3.0 AST 24 ALT 22 bilirubin 0.3 WBC 8.5 hemoglobin 10.3 platelets 121   Objective:  Vital signs in last 24 hours:  Temp:  [97.7 F (36.5 C)-98 F (36.7 C)] 97.7 F (36.5 C) (10/30 0528) Pulse Rate:  [84-100] 94 (10/30 0528) Resp:  [16-20] 16 (10/30 0528) BP: (88-104)/(58-67) 88/58 (10/30 0528) SpO2:  [96 %-99 %] 96 % (10/30 0528) Weight:  [79.8 kg] 79.8 kg (10/30 0528)  Weight change: -4.082 kg Filed Weights   10/24/18 1242 10/25/18 0411 10/26/18 0528  Weight: 79.4 kg 79 kg 79.8 kg    Intake/Output: I/O last 3 completed shifts: In: 500 [P.O.:150; IV Piggyback:350] Out: -    Intake/Output this shift:  Total I/O In: 340 [P.O.:340] Out: -   CVS-tachycardic RS- CTA slightly diminished at base ABD- BS present soft non-distended EXT- no edema left upper extremity AV fistula with thrill and bruit   Basic Metabolic Panel: Recent Labs  Lab 10/21/18 0520 10/22/18 0606 10/23/18 0637 10/24/18 0614 10/25/18 0759 10/25/18 1448 10/26/18 0538  NA 145 141 142 142  --  143 143  K 3.6 5.3* 4.7 5.8*  --  4.1 4.2  CL 101 103 103 103  --  102 102  CO2 28 21* 24 23  --  24 26  GLUCOSE 74 88 89 113*  --  86 90  BUN 42* 29* 43* 66*  --  33* 48*  CREATININE 5.78* 4.22* 6.10* 7.80*  --  5.58* 6.97*  CALCIUM 9.6 9.1 9.8 9.8  --  9.1 9.2  MG  --  2.2  --  2.5* 2.2  --  2.3  PHOS 4.2  --  4.2   --   --   --   --     Liver Function Tests: Recent Labs  Lab 10/21/18 1200 10/22/18 0606 10/23/18 0637 10/24/18 0614 10/25/18 0759 10/26/18 0538  AST 25 47*  --  30 32 24  ALT 24 33  --  26 26 22   ALKPHOS 205* 231*  --  206* 182* 170*  BILITOT 1.2 1.9*  --  1.7* 1.2 1.1  PROT 6.6 6.9  --  6.9 7.0 6.8  ALBUMIN 3.0* 3.3* 3.2* 3.1* 3.1* 3.0*   Recent Labs  Lab 10/21/18 0520 10/24/18 0614  LIPASE 35 32   Recent Labs  Lab 10/22/18 1228 10/25/18 0759 10/26/18 0538  AMMONIA 42* 32 42*    CBC: Recent Labs  Lab 10/20/18 1645  10/22/18 0606 10/23/18 0637 10/24/18 0614 10/25/18 0759 10/26/18 0538  WBC 6.8   < > 8.4 8.2 10.7* 10.0 8.5  NEUTROABS 4.3  --   --   --   --   --   --   HGB 9.1*   < > 9.7* 9.8* 10.1* 10.3* 10.3*  HCT 30.0*   < > 32.0* 33.5* 33.7* 33.9* 33.4*  MCV 106.4*   < > 105.6* 107.7* 105.0* 103.7* 106.0*  PLT 140*   < > 131* 127* 128* 134* 121*   < > = values in this interval not displayed.    Cardiac Enzymes: No results for input(s): CKTOTAL, CKMB, CKMBINDEX, TROPONINI in the last 168 hours.  BNP: Invalid input(s): POCBNP  CBG: Recent Labs  Lab 10/25/18 0734 10/25/18 1132 10/25/18 1636 10/25/18 1924 10/26/18 0732  GLUCAP 89 85 90 139* 107*    Microbiology: Results for orders placed or performed during the hospital encounter of 10/20/18  Blood Culture (routine x 2)     Status: None   Collection Time: 10/20/18  5:10 PM  Result Value Ref Range Status   Specimen Description BLOOD RIGHT WRIST  Final   Special Requests   Final    BOTTLES DRAWN AEROBIC AND ANAEROBIC Blood Culture adequate volume   Culture   Final    NO GROWTH 5 DAYS Performed at [facility], [place] [address], [place], [place] [address]    Report Status 10/25/2018 FINAL  Final  Blood Culture (routine x 2)     Status: None   Collection Time: 10/20/18  5:10 PM  Result Value Ref Range Status   Specimen Description BLOOD RIGHT HAND  Final   Special Requests   Final     BOTTLES DRAWN AEROBIC AND ANAEROBIC Blood Culture results may not be optimal due to an inadequate volume of blood received in culture bottles   Culture   Final    NO GROWTH 5 DAYS Performed at [facility], [place] [address] [place], [place] [address]    Report Status 10/25/2018 FINAL  Final  MRSA PCR Screening     Status: None   Collection Time: 10/21/18  3:49 AM  Result Value Ref Range Status   MRSA by PCR NEGATIVE NEGATIVE Final    Comment:        The GeneXpert MRSA Assay (FDA approved for NASAL specimens only), is one component of a comprehensive MRSA colonization surveillance program. It is not intended to diagnose MRSA infection nor to guide or monitor treatment for MRSA infections. Performed at [facility], [place] [address], [place], [place] [address]   Respiratory Panel by PCR     Status: None   Collection Time: 10/22/18  5:03 PM  Result Value Ref Range Status   Adenovirus NOT DETECTED NOT DETECTED Final   Coronavirus 229E NOT DETECTED NOT DETECTED Final   Coronavirus HKU1 NOT DETECTED NOT DETECTED Final   Coronavirus NL63 NOT DETECTED NOT DETECTED Final   Coronavirus OC43 NOT DETECTED NOT DETECTED Final   Metapneumovirus NOT DETECTED NOT DETECTED Final   Rhinovirus / Enterovirus NOT DETECTED NOT DETECTED Final   Influenza A NOT DETECTED NOT DETECTED Final   Influenza B NOT DETECTED NOT DETECTED Final   Parainfluenza Virus 1 NOT DETECTED NOT DETECTED Final   Parainfluenza Virus 2 NOT DETECTED NOT DETECTED Final   Parainfluenza Virus 3 NOT DETECTED NOT DETECTED Final   Parainfluenza Virus 4 NOT DETECTED NOT DETECTED Final   Respiratory Syncytial Virus NOT DETECTED NOT DETECTED Final   Bordetella pertussis NOT DETECTED NOT DETECTED Final   Chlamydophila pneumoniae NOT DETECTED NOT DETECTED Final   Mycoplasma pneumoniae NOT DETECTED NOT DETECTED Final    Comment: Performed at [facility], [place] [address], [place],  [address]    Coagulation  Studies: Recent Labs    10/24/18 0614  LABPROT 18.7*  INR 1.58    Urinalysis: No results for input(s): COLORURINE, LABSPEC, PHURINE,  GLUCOSEU, HGBUR, BILIRUBINUR, KETONESUR, PROTEINUR, UROBILINOGEN, NITRITE, LEUKOCYTESUR in the last 72 hours.  Invalid input(s): APPERANCEUR    Imaging: Dg Toe 5th Left  Result Date: 10/24/2018 CLINICAL DATA:  Open wound on toe.  Diabetic. EXAM: DG TOE 5TH LEFT COMPARISON:  None. FINDINGS: Mild irregularity of the base of the fifth proximal phalanx. No bone destruction to suggest osteomyelitis. No definite fracture identified. No air within the soft tissues. IMPRESSION: No evidence of fracture or osteomyelitis. Electronically Signed   By: Marin Olp M.D.   On: 10/24/2018 19:25   Ir US Chest  Result Date: 10/26/2018 CLINICAL DATA:  77 year old male with a history of possible pleural fluid. EXAM: CHEST ULTRASOUND COMPARISON:  None. FINDINGS: Sonographic survey of the chest demonstrates no significant fluid. Thoracentesis not performed. IMPRESSION: Sonographic survey demonstrates no significant right pleural fluid. Electronically Signed   By: Corrie Mckusick D.O.   On: 10/26/2018 11:14     Medications:   . sodium chloride 250 mL (10/22/18 1033)  . cefTRIAXone (ROCEPHIN)  IV Stopped (10/25/18 2124)   . aspirin EC  325 mg Oral Daily  . azithromycin  500 mg Oral Q supper  . calcium acetate  2,668 mg Oral TID WC  . Chlorhexidine Gluconate Cloth  6 each Topical Q0600  . collagenase   Topical Daily  . metoprolol tartrate  100 mg Oral BID   sodium chloride, acetaminophen, hydrOXYzine, ipratropium-albuterol, loperamide, ondansetron (ZOFRAN) IV  Assessment/ Plan:  1. AMS - BC's no growth for 3 days. Lactic acid improved to 1.5. Ammonia elevated, 75. Negative CT head and CXR. Abdominal USunrevealing. On ABX. Per primary. Plan CT scan abdomen and pelvis for evaluation of cirrhosis due to low platelets increased INR and high ammonia level 2. Acute  hypoxic respiratory failure- on 2 L nasal cannula.  Will challenge edw tomorrow with HD but may be related to PNA.  Patient started on azithromycin and ceftriaxone day 3 Will follow after UF tomorrow. CT chest reveals moderate right pleural effusion upper lobe predominant interstitial thickening with interval groundglass opacities consistent with pulmonary edema or inflammation or atypical infection.  Small quantities of pleural fluid noted on ultrasound not thought to be able to perform thoracentesis 3. ESRD - On HD MWF. Tolerated HD well on 10/24/2018 Plan to keep on schedule.Removed 3.8 L with dialysis will continue to challenge with dialysis for fluid removal 4. Hypertension/volume - BP improved.  Continue to challenge dry weight 5. Anemia of CKD - Hgb 10.1 6. Secondary Hyperparathyroidism - Ca and phos at goal. Continue VDRA and binders. 7. Nutrition - Renal diet with fluid restrictions. Nepro.  8. DMT2 - per primary 9. Diastolic dysfunction/aortic stenosis 10. OSA 11. A fib - not on anticoagulation 12. Hx GIB 2/2 coumadin    LOS: 5 [name] @TODAY @11 :17 AM

## 2018-10-26 NOTE — Progress Notes (Signed)
[facility] PROGRESS NOTE  [name] MHD:[id number] DOB: Dec 03, 1941 DOA: 10/20/2018  PCP: Patient, No Pcp Per  Brief History/Interval Summary: 77 y.o.malewith medical history significant ofend-stage renal disease on hemodialysis Monday Wednesday Friday, A. fib, CHF, diabetes,hypertension, hyperlipidemia,OSA presented to the hospital from his nursing home for further evaluation of altered mental status. He presented with hypotension and AMS concerning for sepsis.  He was started on broad spectrum antibiotics.  He was noted to have an oxygen requirement and had a CT concerning for edema vs inflammation or atypical infection.  He was started on coverage for CAP.  The CT was also notable for a moderate R sided effusion.  IR was consulted, but there was not enough to tap.  He was also noted to have an elevated ammonia, but he's had intermittent diarrhea prior to presentation and worsening diarrhea since lactlose was started.    Consultants: Nephrology.  Procedures: Hemodialysis  Antibiotics: Patient received broad-spectrum coverage with vancomycin cefepime and Flagyl. Currently on ceftriaxone and azithromycin  Subjective/Interval History: The patient had a large bowel movement in bed.  He denies any abdominal pain.  Stool is noted to be more formed.  Patient denies any nausea vomiting headaches.  ROS: Denies chest pain or shortness of breath  Objective:  Vital Signs  Vitals:   10/25/18 1851 10/25/18 2241 10/26/18 0528 10/26/18 1204  BP: 104/64 99/67 (!) 88/58 109/67  Pulse: 84 100 94 87  Resp: 20  16 20   Temp: 98 F (36.7 C)  97.7 F (36.5 C) 97.7 F (36.5 C)  TempSrc: Oral  Oral Oral  SpO2: 99%  96% 91%  Weight:   79.8 kg   Height:        Intake/Output Summary (Last 24 hours) at 10/26/2018 1331 Last data filed at 10/26/2018 1102 Gross per 24 hour  Intake 340 ml  Output -  Net 340 ml   Filed Weights   10/24/18 1242 10/25/18 0411 10/26/18 0528  Weight:  79.4 kg 79 kg 79.8 kg    General appearance: alert, cooperative, appears stated age and no distress Head: Normocephalic, without obvious abnormality, atraumatic Resp: clear to auscultation bilaterally Cardio: regular rate and rhythm, S1, S2 normal, no murmur, click, rub or gallop GI: soft, non-tender; bowel sounds normal; no masses,  no organomegaly Extremities: extremities normal, atraumatic, no cyanosis or edema Pulses: 2+ and symmetric Neurologic: Awake alert.  In no distress.  Lab Results:  Data Reviewed: I have personally reviewed following labs and imaging studies  CBC: Recent Labs  Lab 10/20/18 1645  10/22/18 0606 10/23/18 0637 10/24/18 0614 10/25/18 0759 10/26/18 0538  WBC 6.8   < > 8.4 8.2 10.7* 10.0 8.5  NEUTROABS 4.3  --   --   --   --   --   --   HGB 9.1*   < > 9.7* 9.8* 10.1* 10.3* 10.3*  HCT 30.0*   < > 32.0* 33.5* 33.7* 33.9* 33.4*  MCV 106.4*   < > 105.6* 107.7* 105.0* 103.7* 106.0*  PLT 140*   < > 131* 127* 128* 134* 121*   < > = values in this interval not displayed.    Basic Metabolic Panel: Recent Labs  Lab 10/21/18 0520 10/22/18 0606 10/23/18 2119 10/24/18 0614 10/25/18 0759 10/25/18 1448 10/26/18 0538  NA 145 141 142 142  --  143 143  K 3.6 5.3* 4.7 5.8*  --  4.1 4.2  CL 101 103 103 103  --  102 102  CO2 28  21* 24 23  --  24 26  GLUCOSE 74 88 89 113*  --  86 90  BUN 42* 29* 43* 66*  --  33* 48*  CREATININE 5.78* 4.22* 6.10* 7.80*  --  5.58* 6.97*  CALCIUM 9.6 9.1 9.8 9.8  --  9.1 9.2  MG  --  2.2  --  2.5* 2.2  --  2.3  PHOS 4.2  --  4.2  --   --   --   --     GFR: Estimated Creatinine Clearance: 8.8 mL/min (A) (by C-G formula based on SCr of 6.97 mg/dL (H)).  Liver Function Tests: Recent Labs  Lab 10/21/18 1200 10/22/18 0606 10/23/18 0637 10/24/18 0614 10/25/18 0759 10/26/18 0538  AST 25 47*  --  30 32 24  ALT 24 33  --  26 26 22   ALKPHOS 205* 231*  --  206* 182* 170*  BILITOT 1.2 1.9*  --  1.7* 1.2 1.1  PROT 6.6 6.9  --   6.9 7.0 6.8  ALBUMIN 3.0* 3.3* 3.2* 3.1* 3.1* 3.0*    Recent Labs  Lab 10/21/18 0520 10/24/18 0614  LIPASE 35 32   Recent Labs  Lab 10/21/18 1200 10/22/18 1228 10/25/18 0759 10/26/18 0538  AMMONIA 75* 42* 32 42*    Coagulation Profile: Recent Labs  Lab 10/22/18 0606 10/24/18 0614  INR 1.52 1.58    CBG: Recent Labs  Lab 10/25/18 1132 10/25/18 1636 10/25/18 1924 10/26/18 0732 10/26/18 1141  GLUCAP 85 90 139* 107* 124*     Recent Results (from the past 240 hour(s))  Blood Culture (routine x 2)     Status: None   Collection Time: 10/20/18  5:10 PM  Result Value Ref Range Status   Specimen Description BLOOD RIGHT WRIST  Final   Special Requests   Final    BOTTLES DRAWN AEROBIC AND ANAEROBIC Blood Culture adequate volume   Culture   Final    NO GROWTH 5 DAYS Performed at [facility], [place] [address] [place], [place] [address]    Report Status 10/25/2018 FINAL  Final  Blood Culture (routine x 2)     Status: None   Collection Time: 10/20/18  5:10 PM  Result Value Ref Range Status   Specimen Description BLOOD RIGHT HAND  Final   Special Requests   Final    BOTTLES DRAWN AEROBIC AND ANAEROBIC Blood Culture results may not be optimal due to an inadequate volume of blood received in culture bottles   Culture   Final    NO GROWTH 5 DAYS Performed at [facility], [place] [address], [place], [place] [address]    Report Status 10/25/2018 FINAL  Final  MRSA PCR Screening     Status: None   Collection Time: 10/21/18  3:49 AM  Result Value Ref Range Status   MRSA by PCR NEGATIVE NEGATIVE Final    Comment:        The GeneXpert MRSA Assay (FDA approved for NASAL specimens only), is one component of a comprehensive MRSA colonization surveillance program. It is not intended to diagnose MRSA infection nor to guide or monitor treatment for MRSA infections. Performed at [facility], [place] [address] [place], [place] [address]   Respiratory Panel by  PCR     Status: None   Collection Time: 10/22/18  5:03 PM  Result Value Ref Range Status   Adenovirus NOT DETECTED NOT DETECTED Final   Coronavirus 229E NOT DETECTED NOT  DETECTED Final   Coronavirus HKU1 NOT DETECTED NOT DETECTED Final   Coronavirus NL63 NOT DETECTED NOT DETECTED Final   Coronavirus OC43 NOT DETECTED NOT DETECTED Final   Metapneumovirus NOT DETECTED NOT DETECTED Final   Rhinovirus / Enterovirus NOT DETECTED NOT DETECTED Final   Influenza A NOT DETECTED NOT DETECTED Final   Influenza B NOT DETECTED NOT DETECTED Final   Parainfluenza Virus 1 NOT DETECTED NOT DETECTED Final   Parainfluenza Virus 2 NOT DETECTED NOT DETECTED Final   Parainfluenza Virus 3 NOT DETECTED NOT DETECTED Final   Parainfluenza Virus 4 NOT DETECTED NOT DETECTED Final   Respiratory Syncytial Virus NOT DETECTED NOT DETECTED Final   Bordetella pertussis NOT DETECTED NOT DETECTED Final   Chlamydophila pneumoniae NOT DETECTED NOT DETECTED Final   Mycoplasma pneumoniae NOT DETECTED NOT DETECTED Final    Comment: Performed at [facility], [place] [address], [place], [place] [address]      Radiology Studies: Dg Toe 5th Left  Result Date: 10/24/2018 CLINICAL DATA:  Open wound on toe.  Diabetic. EXAM: DG TOE 5TH LEFT COMPARISON:  None. FINDINGS: Mild irregularity of the base of the fifth proximal phalanx. No bone destruction to suggest osteomyelitis. No definite fracture identified. No air within the soft tissues. IMPRESSION: No evidence of fracture or osteomyelitis. Electronically Signed   By: Marin Olp M.D.   On: 10/24/2018 19:25   Ir US Chest  Result Date: 10/26/2018 CLINICAL DATA:  77 year old male with a history of possible pleural fluid. EXAM: CHEST ULTRASOUND COMPARISON:  None. FINDINGS: Sonographic survey of the chest demonstrates no significant fluid. Thoracentesis not performed. IMPRESSION: Sonographic survey demonstrates no significant right pleural fluid. Electronically Signed   By: Corrie Mckusick D.O.   On: 10/26/2018 11:14     Medications:  Scheduled: . aspirin EC  325 mg Oral Daily  . azithromycin  500 mg Oral Q supper  . calcium acetate  2,668 mg Oral TID WC  . Chlorhexidine Gluconate Cloth  6 each Topical Q0600  . collagenase   Topical Daily  . metoprolol tartrate  100 mg Oral BID   Continuous: . sodium chloride 250 mL (10/22/18 1033)  . cefTRIAXone (ROCEPHIN)  IV Stopped (10/25/18 2124)   FTD:DUKGUR chloride, acetaminophen, hydrOXYzine, ipratropium-albuterol, loperamide, ondansetron (ZOFRAN) IV  Assessment/Plan:    Acute hypoxic respiratory failure Seems to be improving.  Continue to wean down oxygen.  CT scan of the chest did show some groundglass opacities concerning for pulmonary edema versus atypical infection.  Patient was started on ceftriaxone and azithromycin.  Patient receiving dialysis as well.  Continue to monitor.  Concern for sepsis with elevated lactic acid level and hypotension Culture data has been negative so far.  Remains afebrile.  WBC has been normal.  Continue to monitor for now.  Seems to be stable from a hemodynamic standpoint.  Acute metabolic encephalopathy Apparently he has had waxing and waning mental status.  There was some concern for encephalopathy due to hyperammonemia.  Ammonia level was in the 70s.  Patient was given lactulose resulting in explosive diarrhea.  Ammonia levels only minimally elevated.  Hold off on further doses of lactulose for now.  Patient has had normal B12 level.  LFTs normal.  Elevated ammonia level Etiology unclear.  There was some concern for liver cirrhosis due to thrombocytopenia low albumin and elevated INR.  Lactulose currently on hold due to diarrhea.  CT scan of the abdomen pelvis is pending.  Nausea and vomiting Appears to have  resolved.  CT scan is pending.  Abdomen is benign today.  Diarrhea Most likely due to lactulose.  Holding for.  Stool is more formed this morning.  Elevated alkaline  phosphatase Right upper quadrant ultrasound was unremarkable.  CT scan is pending.  This will need further work-up as outpatient.  Right-sided pleural effusion Thoracentesis was ordered however no significant fluid was noted when he went down for the procedure.  End-stage renal disease on hemodialysis on Monday Wednesday and Friday Management per nephrology.  Paroxysmal atrial fibrillation Mild RVR noted during this hospitalization.  Metoprolol dose increased to 100 mg twice daily.  Rate is controlled.  Not noted to be on anticoagulation.  As per cardiology note from 2018 not on warfarin due to history of GI bleed.  Chronic diastolic CHF Stable.  Volume being managed with hemodialysis.  Diabetes mellitus type 2 HbA1c 4.2.  Essential hypertension Monitor blood pressures closely.  Obstructive sleep apnea Continue CPAP  Anemia of chronic disease Stable.  Thrombocytopenia Stable.  No evidence of overt bleeding.   DVT Prophylaxis: SCD;s    Code Status: Full code Family Communication: Discussed with the patient Disposition Plan: Management as outlined above    LOS: 5 days   [facility] Pager [phone] 10/26/2018, 1:31 PM  If 7PM-7AM, please contact night-coverage at [email or web address], password [email or web address]

## 2018-10-27 ENCOUNTER — Inpatient Hospital Stay (HOSPITAL_COMMUNITY): Payer: Medicare Other

## 2018-10-27 DIAGNOSIS — I480 Paroxysmal atrial fibrillation: Secondary | ICD-10-CM

## 2018-10-27 DIAGNOSIS — R0600 Dyspnea, unspecified: Secondary | ICD-10-CM

## 2018-10-27 LAB — GLUCOSE, CAPILLARY
GLUCOSE-CAPILLARY: 136 mg/dL — AB (ref 70–99)
GLUCOSE-CAPILLARY: 123 mg/dL — AB (ref 70–99)
GLUCOSE-CAPILLARY: 129 mg/dL — AB (ref 70–99)
Glucose-Capillary: 119 mg/dL — ABNORMAL HIGH (ref 70–99)
Glucose-Capillary: 95 mg/dL (ref 70–99)
Glucose-Capillary: 83 mg/dL (ref 70–99)

## 2018-10-27 MED ORDER — SODIUM CHLORIDE 0.9 % IV SOLN: 100.0000 mL | INTRAVENOUS | Status: DC | PRN

## 2018-10-27 MED ORDER — SACCHAROMYCES BOULARDII 250 MG PO CAPS
250.0000 mg | ORAL_CAPSULE | Freq: Two times a day (BID) | ORAL | Status: DC
  Administered 2018-10-27 – 2018-11-02 (×13): 250 mg via ORAL
  Filled 2018-10-27 (×13): qty 1

## 2018-10-27 MED ORDER — LIDOCAINE-PRILOCAINE 2.5-2.5 % EX CREA: 1.0000 "application " | TOPICAL_CREAM | CUTANEOUS | Status: DC | PRN

## 2018-10-27 MED ORDER — CHLORHEXIDINE GLUCONATE CLOTH 2 % EX PADS: 6.0000 | MEDICATED_PAD | Freq: Every day | CUTANEOUS | Status: DC

## 2018-10-27 MED ORDER — LOPERAMIDE HCL 2 MG PO CAPS
4.0000 mg | ORAL_CAPSULE | Freq: Three times a day (TID) | ORAL | Status: AC
  Administered 2018-10-27 (×2): 4 mg via ORAL
  Filled 2018-10-27 (×2): qty 2

## 2018-10-27 MED ORDER — METOPROLOL TARTRATE 50 MG PO TABS
50.0000 mg | ORAL_TABLET | Freq: Two times a day (BID) | ORAL | Status: DC
  Administered 2018-10-27 – 2018-11-01 (×10): 50 mg via ORAL
  Filled 2018-10-27 (×11): qty 1

## 2018-10-27 MED ORDER — PENTAFLUOROPROP-TETRAFLUOROETH EX AERO: 1.0000 "application " | INHALATION_SPRAY | CUTANEOUS | Status: DC | PRN

## 2018-10-27 MED ORDER — HEPARIN SODIUM (PORCINE) 1000 UNIT/ML DIALYSIS: 1000.0000 [IU] | INTRAMUSCULAR | Status: DC | PRN

## 2018-10-27 MED ORDER — LIDOCAINE HCL (PF) 1 % IJ SOLN: 5.0000 mL | INTRAMUSCULAR | Status: DC | PRN

## 2018-10-27 NOTE — Progress Notes (Signed)
Pt refused cpap for the night. RT will continue to monitor as needed.

## 2018-10-27 NOTE — Care Management Important Message (Signed)
Important Message  Patient Details  Name: [name] MRN: [id number] Date of Birth: February 29, 1941   Medicare Important Message Given:  Yes    Saharah Sherrow P [place] 10/27/2018, 4:10 PM

## 2018-10-27 NOTE — Progress Notes (Signed)
[facility] ROUNDING NOTE   Subjective:   Comfortable this morning still wearing oxygen plan for dialysis today  Blood pressure 96/52 pulse 81 temperature 98.3 O2 sats 98% 2 L nasal cannula  Sodium 138 potassium 5.1 chloride 97 CO2 29 glucose 134 BUN 57 creatinine 7.8 phosphorus 5.5 albumin 2.8 hemoglobin 10.0 WBC 9.9 platelets 131   Objective:  Vital signs in last 24 hours:  Temp:  [97.7 F (36.5 C)-98.6 F (37 C)] 98.3 F (36.8 C) (10/31 0742) Pulse Rate:  [81-97] 81 (10/31 0941) Resp:  [18-20] 18 (10/31 0742) BP: (93-110)/(52-68) 96/52 (10/31 0941) SpO2:  [91 %-98 %] 98 % (10/31 0742)  Weight change:  Filed Weights   10/24/18 1242 10/25/18 0411 10/26/18 0528  Weight: 79.4 kg 79 kg 79.8 kg    Intake/Output: I/O last 3 completed shifts: In: 1160 [P.O.:1060; IV Piggyback:100] Out: -    Intake/Output this shift:  Total I/O In: 240 [P.O.:240] Out: -   CVS-tachycardic RS- CTA slightly diminished at base ABD- BS present soft non-distended EXT- no edema left upper extremity AV fistula with thrill and bruit   Basic Metabolic Panel: Recent Labs  Lab 10/21/18 0520 10/22/18 0606 10/23/18 0637 10/24/18 0614 10/25/18 0759 10/25/18 1448 10/26/18 0538 10/26/18 1954  NA 145 141 142 142  --  143 143 138  K 3.6 5.3* 4.7 5.8*  --  4.1 4.2 5.1  CL 101 103 103 103  --  102 102 97*  CO2 28 21* 24 23  --  24 26 29   GLUCOSE 74 88 89 113*  --  86 90 134*  BUN 42* 29* 43* 66*  --  33* 48* 57*  CREATININE 5.78* 4.22* 6.10* 7.80*  --  5.58* 6.97* 7.79*  CALCIUM 9.6 9.1 9.8 9.8  --  9.1 9.2 9.1  MG  --  2.2  --  2.5* 2.2  --  2.3  --   PHOS 4.2  --  4.2  --   --   --   --  5.5*    Liver Function Tests: Recent Labs  Lab 10/21/18 1200 10/22/18 0606 10/23/18 0637 10/24/18 0614 10/25/18 0759 10/26/18 0538 10/26/18 1954  AST 25 47*  --  30 32 24  --   ALT 24 33  --  26 26 22   --   ALKPHOS 205* 231*  --  206* 182* 170*  --   BILITOT 1.2 1.9*  --  1.7*  1.2 1.1  --   PROT 6.6 6.9  --  6.9 7.0 6.8  --   ALBUMIN 3.0* 3.3* 3.2* 3.1* 3.1* 3.0* 2.8*   Recent Labs  Lab 10/21/18 0520 10/24/18 0614  LIPASE 35 32   Recent Labs  Lab 10/22/18 1228 10/25/18 0759 10/26/18 0538  AMMONIA 42* 32 42*    CBC: Recent Labs  Lab 10/20/18 1645  10/23/18 0637 10/24/18 0614 10/25/18 0759 10/26/18 0538 10/26/18 1954  WBC 6.8   < > 8.2 10.7* 10.0 8.5 9.9  NEUTROABS 4.3  --   --   --   --   --   --   HGB 9.1*   < > 9.8* 10.1* 10.3* 10.3* 10.0*  HCT 30.0*   < > 33.5* 33.7* 33.9* 33.4* 33.4*  MCV 106.4*   < > 107.7* 105.0* 103.7* 106.0* 106.7*  PLT 140*   < > 127* 128* 134* 121* 131*   < > = values in this interval not displayed.    Cardiac Enzymes: No results  for input(s): CKTOTAL, CKMB, CKMBINDEX, TROPONINI in the last 168 hours.  BNP: Invalid input(s): POCBNP  CBG: Recent Labs  Lab 10/26/18 0732 10/26/18 1141 10/26/18 1630 10/27/18 0101 10/27/18 0742  GLUCAP 107* 124* 179* 136* 25*    Microbiology: Results for orders placed or performed during the hospital encounter of 10/20/18  Blood Culture (routine x 2)     Status: None   Collection Time: 10/20/18  5:10 PM  Result Value Ref Range Status   Specimen Description BLOOD RIGHT WRIST  Final   Special Requests   Final    BOTTLES DRAWN AEROBIC AND ANAEROBIC Blood Culture adequate volume   Culture   Final    NO GROWTH 5 DAYS Performed at [facility], [place] [address], [place], [place] [address]    Report Status 10/25/2018 FINAL  Final  Blood Culture (routine x 2)     Status: None   Collection Time: 10/20/18  5:10 PM  Result Value Ref Range Status   Specimen Description BLOOD RIGHT HAND  Final   Special Requests   Final    BOTTLES DRAWN AEROBIC AND ANAEROBIC Blood Culture results may not be optimal due to an inadequate volume of blood received in culture bottles   Culture   Final    NO GROWTH 5 DAYS Performed at [facility], [place] [address], [place], [place]  [address]    Report Status 10/25/2018 FINAL  Final  MRSA PCR Screening     Status: None   Collection Time: 10/21/18  3:49 AM  Result Value Ref Range Status   MRSA by PCR NEGATIVE NEGATIVE Final    Comment:        The GeneXpert MRSA Assay (FDA approved for NASAL specimens only), is one component of a comprehensive MRSA colonization surveillance program. It is not intended to diagnose MRSA infection nor to guide or monitor treatment for MRSA infections. Performed at [facility], [place] [address] [place], [place] [address]   Respiratory Panel by PCR     Status: None   Collection Time: 10/22/18  5:03 PM  Result Value Ref Range Status   Adenovirus NOT DETECTED NOT DETECTED Final   Coronavirus 229E NOT DETECTED NOT DETECTED Final   Coronavirus HKU1 NOT DETECTED NOT DETECTED Final   Coronavirus NL63 NOT DETECTED NOT DETECTED Final   Coronavirus OC43 NOT DETECTED NOT DETECTED Final   Metapneumovirus NOT DETECTED NOT DETECTED Final   Rhinovirus / Enterovirus NOT DETECTED NOT DETECTED Final   Influenza A NOT DETECTED NOT DETECTED Final   Influenza B NOT DETECTED NOT DETECTED Final   Parainfluenza Virus 1 NOT DETECTED NOT DETECTED Final   Parainfluenza Virus 2 NOT DETECTED NOT DETECTED Final   Parainfluenza Virus 3 NOT DETECTED NOT DETECTED Final   Parainfluenza Virus 4 NOT DETECTED NOT DETECTED Final   Respiratory Syncytial Virus NOT DETECTED NOT DETECTED Final   Bordetella pertussis NOT DETECTED NOT DETECTED Final   Chlamydophila pneumoniae NOT DETECTED NOT DETECTED Final   Mycoplasma pneumoniae NOT DETECTED NOT DETECTED Final    Comment: Performed at [facility], [place] [address], [place],  [address]    Coagulation Studies: No results for input(s): LABPROT, INR in the last 72 hours.  Urinalysis: No results for input(s): COLORURINE, LABSPEC, PHURINE, GLUCOSEU, HGBUR, BILIRUBINUR, KETONESUR, PROTEINUR, UROBILINOGEN, NITRITE, LEUKOCYTESUR in the last 72  hours.  Invalid input(s): APPERANCEUR    Imaging: No results found.   Medications:   . sodium chloride 250  mL (10/22/18 1033)  . cefTRIAXone (ROCEPHIN)  IV 1 g (10/26/18 2355)   . aspirin EC  325 mg Oral Daily  . azithromycin  500 mg Oral Q supper  . calcium acetate  2,668 mg Oral TID WC  . Chlorhexidine Gluconate Cloth  6 each Topical Q0600  . collagenase   Topical Daily  . metoprolol tartrate  100 mg Oral BID   sodium chloride, acetaminophen, alteplase, hydrOXYzine, ipratropium-albuterol, loperamide, ondansetron (ZOFRAN) IV  Assessment/ Plan:  1. AMS - BC's no growth for 3 days. Lactic acid improved to 1.5. Ammonia elevated, 75. Negative CT head and CXR. Abdominal USunrevealing. On ABX. Per primary. Plan CT scan abdomen and pelvis for evaluation of cirrhosis due to low platelets increased INR and high ammonia level 2. Acute hypoxic respiratory failure- on 2 L nasal cannula.  Will challenge edw tomorrow with HD but may be related to PNA.  Patient started on azithromycin and ceftriaxone day 4 Will follow after UF tomorrow. CT chest reveals moderate right pleural effusion upper lobe predominant interstitial thickening with interval groundglass opacities consistent with pulmonary edema or inflammation or atypical infection.  Small quantities of pleural fluid noted on ultrasound not thought to be able to perform thoracentesis 3. ESRD - On HD MWF. Tolerated HD well on 10/24/2018 Plan to keep on schedule.Removed 3.8 L with dialysis will continue to challenge with dialysis for fluid removal did not receive dialysis 10/26/2018 but will dialyze this morning and placed back on schedule tomorrow 4. Hypertension/volume - BP improved.  Continue to challenge dry weight 5. Anemia of CKD - Hgb 10.1 6. Secondary Hyperparathyroidism - Ca and phos at goal. Continue VDRA and binders. 7. Nutrition - Renal diet with fluid restrictions. Nepro.  8. DMT2 - per primary 9. Diastolic  dysfunction/aortic stenosis 10. OSA 11. A fib - not on anticoagulation 12. Hx GIB 2/2 coumadin    LOS: 6 Sherril Croon @TODAY @10 :06 AM

## 2018-10-27 NOTE — Progress Notes (Signed)
[facility] PROGRESS NOTE  [name] DXI:[id number] DOB: March 19, 1941 DOA: 10/20/2018  PCP: Patient, No Pcp Per  Brief History/Interval Summary: 77 y.o.malewith medical history significant ofend-stage renal disease on hemodialysis Monday Wednesday Friday, A. fib, CHF, diabetes,hypertension, hyperlipidemia,OSA presented to the hospital from his nursing home for further evaluation of altered mental status. He presented with hypotension and AMS concerning for sepsis.  He was started on broad spectrum antibiotics.  He was noted to have an oxygen requirement and had a CT concerning for edema vs inflammation or atypical infection.  He was started on coverage for CAP.  The CT was also notable for a moderate R sided effusion.  IR was consulted, but there was not enough to tap.  He was also noted to have an elevated ammonia, but he's had intermittent diarrhea prior to presentation and worsening diarrhea since lactlose was started.    Consultants: Nephrology.  Procedures: Hemodialysis  Antibiotics: Patient received broad-spectrum coverage with vancomycin cefepime and Flagyl. Currently on ceftriaxone and azithromycin  Subjective/Interval History: Patient states that he continues to have frequent bowel movements although not as loose as before.  Denies any abdominal pain nausea or vomiting.  Denies any shortness of breath or cough.    ROS: Denies any chest pain  Objective:  Vital Signs  Vitals:   10/26/18 2355 10/27/18 0742 10/27/18 0941 10/27/18 1146  BP: 110/67 (!) 93/58 (!) 96/52 (!) 99/57  Pulse: 97 90 81 68  Resp:  18  18  Temp:  98.3 F (36.8 C)  97.6 F (36.4 C)  TempSrc:  Oral  Oral  SpO2:  98%  (!) 86%  Weight:      Height:        Intake/Output Summary (Last 24 hours) at 10/27/2018 1206 Last data filed at 10/27/2018 0907 Gross per 24 hour  Intake 1060 ml  Output -  Net 1060 ml   Filed Weights   10/24/18 1242 10/25/18 0411 10/26/18 0528  Weight: 79.4 kg  79 kg 79.8 kg    General appearance: Awake alert.  In no distress Resp: Coarse breath sounds bilaterally.  Normal effort at rest. Cardio: S1-S2 is normal regular.  No S3-S4.  No rubs murmurs or bruit GI: Abdomen is soft.  Nontender nondistended.  No masses organomegaly.  Bowel sounds are present normal. Extremities: No edema Neurologic: Awake alert.  In no distress  Lab Results:  Data Reviewed: I have personally reviewed following labs and imaging studies  CBC: Recent Labs  Lab 10/20/18 1645  10/23/18 0637 10/24/18 0614 10/25/18 0759 10/26/18 0538 10/26/18 1954  WBC 6.8   < > 8.2 10.7* 10.0 8.5 9.9  NEUTROABS 4.3  --   --   --   --   --   --   HGB 9.1*   < > 9.8* 10.1* 10.3* 10.3* 10.0*  HCT 30.0*   < > 33.5* 33.7* 33.9* 33.4* 33.4*  MCV 106.4*   < > 107.7* 105.0* 103.7* 106.0* 106.7*  PLT 140*   < > 127* 128* 134* 121* 131*   < > = values in this interval not displayed.    Basic Metabolic Panel: Recent Labs  Lab 10/21/18 0520 10/22/18 0606 10/23/18 7673 10/24/18 0614 10/25/18 0759 10/25/18 1448 10/26/18 0538 10/26/18 1954  NA 145 141 142 142  --  143 143 138  K 3.6 5.3* 4.7 5.8*  --  4.1 4.2 5.1  CL 101 103 103 103  --  102 102 97*  CO2 28 21*  24 23  --  24 26 29   GLUCOSE 74 88 89 113*  --  86 90 134*  BUN 42* 29* 43* 66*  --  33* 48* 57*  CREATININE 5.78* 4.22* 6.10* 7.80*  --  5.58* 6.97* 7.79*  CALCIUM 9.6 9.1 9.8 9.8  --  9.1 9.2 9.1  MG  --  2.2  --  2.5* 2.2  --  2.3  --   PHOS 4.2  --  4.2  --   --   --   --  5.5*    GFR: Estimated Creatinine Clearance: 7.9 mL/min (A) (by C-G formula based on SCr of 7.79 mg/dL (H)).  Liver Function Tests: Recent Labs  Lab 10/21/18 1200 10/22/18 0606 10/23/18 0637 10/24/18 0614 10/25/18 0759 10/26/18 0538 10/26/18 1954  AST 25 47*  --  30 32 24  --   ALT 24 33  --  26 26 22   --   ALKPHOS 205* 231*  --  206* 182* 170*  --   BILITOT 1.2 1.9*  --  1.7* 1.2 1.1  --   PROT 6.6 6.9  --  6.9 7.0 6.8  --     ALBUMIN 3.0* 3.3* 3.2* 3.1* 3.1* 3.0* 2.8*    Recent Labs  Lab 10/21/18 0520 10/24/18 0614  LIPASE 35 32   Recent Labs  Lab 10/21/18 1200 10/22/18 1228 10/25/18 0759 10/26/18 0538  AMMONIA 75* 42* 32 42*    Coagulation Profile: Recent Labs  Lab 10/22/18 0606 10/24/18 0614  INR 1.52 1.58    CBG: Recent Labs  Lab 10/26/18 0732 10/26/18 1141 10/26/18 1630 10/27/18 0101 10/27/18 0742  GLUCAP 107* 124* 179* 136* 119*     Recent Results (from the past 240 hour(s))  Blood Culture (routine x 2)     Status: None   Collection Time: 10/20/18  5:10 PM  Result Value Ref Range Status   Specimen Description BLOOD RIGHT WRIST  Final   Special Requests   Final    BOTTLES DRAWN AEROBIC AND ANAEROBIC Blood Culture adequate volume   Culture   Final    NO GROWTH 5 DAYS Performed at [facility], [place] [address] [place], [place] [address]    Report Status 10/25/2018 FINAL  Final  Blood Culture (routine x 2)     Status: None   Collection Time: 10/20/18  5:10 PM  Result Value Ref Range Status   Specimen Description BLOOD RIGHT HAND  Final   Special Requests   Final    BOTTLES DRAWN AEROBIC AND ANAEROBIC Blood Culture results may not be optimal due to an inadequate volume of blood received in culture bottles   Culture   Final    NO GROWTH 5 DAYS Performed at [facility], [place] [address], [place], [place] [address]    Report Status 10/25/2018 FINAL  Final  MRSA PCR Screening     Status: None   Collection Time: 10/21/18  3:49 AM  Result Value Ref Range Status   MRSA by PCR NEGATIVE NEGATIVE Final    Comment:        The GeneXpert MRSA Assay (FDA approved for NASAL specimens only), is one component of a comprehensive MRSA colonization surveillance program. It is not intended to diagnose MRSA infection nor to guide or monitor treatment for MRSA infections. Performed at [facility], [place] [address], [place], [place] [address]   Respiratory Panel by  PCR     Status: None  Collection Time: 10/22/18  5:03 PM  Result Value Ref Range Status   Adenovirus NOT DETECTED NOT DETECTED Final   Coronavirus 229E NOT DETECTED NOT DETECTED Final   Coronavirus HKU1 NOT DETECTED NOT DETECTED Final   Coronavirus NL63 NOT DETECTED NOT DETECTED Final   Coronavirus OC43 NOT DETECTED NOT DETECTED Final   Metapneumovirus NOT DETECTED NOT DETECTED Final   Rhinovirus / Enterovirus NOT DETECTED NOT DETECTED Final   Influenza A NOT DETECTED NOT DETECTED Final   Influenza B NOT DETECTED NOT DETECTED Final   Parainfluenza Virus 1 NOT DETECTED NOT DETECTED Final   Parainfluenza Virus 2 NOT DETECTED NOT DETECTED Final   Parainfluenza Virus 3 NOT DETECTED NOT DETECTED Final   Parainfluenza Virus 4 NOT DETECTED NOT DETECTED Final   Respiratory Syncytial Virus NOT DETECTED NOT DETECTED Final   Bordetella pertussis NOT DETECTED NOT DETECTED Final   Chlamydophila pneumoniae NOT DETECTED NOT DETECTED Final   Mycoplasma pneumoniae NOT DETECTED NOT DETECTED Final    Comment: Performed at [facility], [place] [address], [place], [place] [address]      Radiology Studies: No results found.   Medications:  Scheduled: . aspirin EC  325 mg Oral Daily  . azithromycin  500 mg Oral Q supper  . calcium acetate  2,668 mg Oral TID WC  . Chlorhexidine Gluconate Cloth  6 each Topical Q0600  . Chlorhexidine Gluconate Cloth  6 each Topical Q0600  . collagenase   Topical Daily  . metoprolol tartrate  100 mg Oral BID   Continuous: . sodium chloride 250 mL (10/22/18 1033)  . cefTRIAXone (ROCEPHIN)  IV 1 g (10/26/18 2355)   GMW:NUUVOZ chloride, acetaminophen, alteplase, hydrOXYzine, ipratropium-albuterol, loperamide, ondansetron (ZOFRAN) IV    Assessment/Plan:  Acute hypoxic respiratory failure Seems to be stable from a respiratory standpoint.  Continue to wean down oxygen.  Will order chest x-ray tomorrow morning.  CT scan of the chest did show some groundglass  opacities concerning for pulmonary edema versus atypical infection.  Patient was started on ceftriaxone and azithromycin.  She is to be dialyzed today.  He was not dialyzed yesterday.   Concern for sepsis with elevated lactic acid level and hypotension Culture data has been negative so far.  Remains afebrile.  WBC has been normal.  Continue to monitor for now.  Seems to be stable from a hemodynamic standpoint.  Change to oral antibiotics tomorrow depending on results of chest x-ray.  Acute metabolic encephalopathy Apparently he has had waxing and waning mental status.  There was some concern for encephalopathy due to hyperammonemia.  Ammonia level was in the 70s.  Patient was given lactulose resulting in explosive diarrhea.  Ammonia levels only minimally elevated.  Hold off on further doses of lactulose for now.  Patient has had normal B12 level.  LFTs normal.  Mental status appears to be at baseline now.  Elevated ammonia level Etiology unclear.  There was some concern for liver cirrhosis due to thrombocytopenia low albumin and elevated INR.  Lactulose currently on hold due to diarrhea.  CT scan of the abdomen pelvis is pending.  Nausea and vomiting No further episodes of nausea and vomiting.  CT scan still pending.  Diarrhea Most likely due to lactulose.  Lactulose discontinued.  Patient continues to have frequent BMs.  We will give Imodium.  Elevated alkaline phosphatase Right upper quadrant ultrasound was unremarkable.  CT scan is pending.  This will need further work-up as outpatient.  Right-sided pleural effusion Thoracentesis was  ordered however no significant fluid was noted when he went down for the procedure.  Chest x-ray tomorrow morning.  End-stage renal disease on hemodialysis on Monday Wednesday and Friday Management per nephrology.  Unfortunately he did not undergo dialysis yesterday.  Discussed with nephrology.  He will be dialyzed today.  Paroxysmal atrial  fibrillation Mild RVR noted during this hospitalization.  Metoprolol dose increased to 100 mg twice daily.  Rate is controlled.  Not noted to be on anticoagulation.  As per cardiology note from 2018 he is not on warfarin due to history of GI bleed.  Blood pressure noted to be borderline low.  We will decrease the dose of metoprolol.  Chronic diastolic CHF Stable.  Volume being managed with hemodialysis.  Diabetes mellitus type 2 HbA1c 4.2.  Essential hypertension Monitor blood pressures closely.  Obstructive sleep apnea Continue CPAP  Anemia of chronic disease Stable.  Thrombocytopenia Stable.  No evidence of overt bleeding.   DVT Prophylaxis: SCD;s    Code Status: Full code Family Communication: Discussed with the patient Disposition Plan: Patient lives in an assisted living facility.  Repeat chest x-ray tomorrow.  If work-up is unremarkable including CT scan of the abdomen pelvis, he could be discharged tomorrow.  Hopefully will be able to wean him off of oxygen.    LOS: 6 days   [facility] Pager [phone] 10/27/2018, 12:06 PM  If 7PM-7AM, please contact night-coverage at [email or web address], password [facility]

## 2018-10-27 NOTE — Plan of Care (Signed)
  Problem: Education: Goal: Knowledge of General Education information will improve Description Including pain rating scale, medication(s)/side effects and non-pharmacologic comfort measures Outcome: Progressing   Problem: Health Behavior/Discharge Planning: Goal: Ability to manage health-related needs will improve Outcome: Progressing   

## 2018-10-28 ENCOUNTER — Inpatient Hospital Stay (HOSPITAL_COMMUNITY): Payer: Medicare Other

## 2018-10-28 LAB — BASIC METABOLIC PANEL
ANION GAP: 8 (ref 5–15)
BUN: 24 mg/dL — AB (ref 8–23)
CHLORIDE: 100 mmol/L (ref 98–111)
CO2: 28 mmol/L (ref 22–32)
Calcium: 8.6 mg/dL — ABNORMAL LOW (ref 8.9–10.3)
Creatinine, Ser: 4.77 mg/dL — ABNORMAL HIGH (ref 0.61–1.24)
GFR calc Af Amer: 12 mL/min — ABNORMAL LOW (ref >60)
GFR, EST NON AFRICAN AMERICAN: 11 mL/min — AB (ref >60)
Glucose, Bld: 83 mg/dL (ref 70–99)
Potassium: 3.9 mmol/L (ref 3.5–5.1)
Sodium: 136 mmol/L (ref 135–145)

## 2018-10-28 LAB — CBC
HCT: 32 % — ABNORMAL LOW (ref 39.0–52.0)
HEMOGLOBIN: 9.8 g/dL — AB (ref 13.0–17.0)
MCH: 31.9 pg (ref 26.0–34.0)
MCHC: 30.6 g/dL (ref 30.0–36.0)
MCV: 104.2 fL — ABNORMAL HIGH (ref 80.0–100.0)
Platelets: 116 10*3/uL — ABNORMAL LOW (ref 150–400)
RBC: 3.07 MIL/uL — ABNORMAL LOW (ref 4.22–5.81)
RDW: 18.7 % — ABNORMAL HIGH (ref 11.5–15.5)
WBC: 7.1 10*3/uL (ref 4.0–10.5)
nRBC: 1.4 % — ABNORMAL HIGH (ref 0.0–0.2)

## 2018-10-28 LAB — GLUCOSE, CAPILLARY
GLUCOSE-CAPILLARY: 114 mg/dL — AB (ref 70–99)
Glucose-Capillary: 78 mg/dL (ref 70–99)
Glucose-Capillary: 119 mg/dL — ABNORMAL HIGH (ref 70–99)
Glucose-Capillary: 169 mg/dL — ABNORMAL HIGH (ref 70–99)

## 2018-10-28 LAB — TSH: TSH: 1.579 u[IU]/mL (ref 0.350–4.500)

## 2018-10-28 MED ORDER — LIDOCAINE HCL (PF) 1 % IJ SOLN: 5.0000 mL | INTRAMUSCULAR | Status: DC | PRN

## 2018-10-28 MED ORDER — LIDOCAINE-PRILOCAINE 2.5-2.5 % EX CREA: 1.0000 "application " | TOPICAL_CREAM | CUTANEOUS | Status: DC | PRN

## 2018-10-28 MED ORDER — PENTAFLUOROPROP-TETRAFLUOROETH EX AERO: 1.0000 "application " | INHALATION_SPRAY | CUTANEOUS | Status: DC | PRN

## 2018-10-28 MED ORDER — ALTEPLASE 2 MG IJ SOLR: 2.0000 mg | Freq: Once | INTRAMUSCULAR | Status: DC | PRN

## 2018-10-28 MED ORDER — HEPARIN SODIUM (PORCINE) 1000 UNIT/ML DIALYSIS: 1000.0000 [IU] | INTRAMUSCULAR | Status: DC | PRN

## 2018-10-28 MED ORDER — SODIUM CHLORIDE 0.9 % IV SOLN: 100.0000 mL | INTRAVENOUS | Status: DC | PRN

## 2018-10-28 MED ORDER — TECHNETIUM TC 99M MEDRONATE IV KIT
20.0000 | PACK | Freq: Once | INTRAVENOUS | Status: AC | PRN
  Administered 2018-10-28: 20 via INTRAVENOUS

## 2018-10-28 NOTE — Progress Notes (Signed)
PT Cancellation Note  Patient Details Name: [name] MRN: [id number] DOB: Jan 17, 1941   Cancelled Treatment:    Reason Eval/Treat Not Completed: Patient declined, no reason specified Patient politely declines PT until after eating breakfast; PT assisted nurse in sliding him up in bed. Will attempt to return later if time/schedule allow.    Deniece Ree PT, DPT, CBIS  Supplemental Physical Therapist [facility]    Pager [phone] Acute Rehab Office [phone]

## 2018-10-28 NOTE — Progress Notes (Signed)
OT Cancellation Note  Patient Details Name: [name] MRN: [id number] DOB: [birth year]   Cancelled Treatment:    Reason Eval/Treat Not Completed: Patient at procedure or test/ unavailable. Will follow.  [name] 10/28/2018, 2:08 PM  [name], OTR/L Acute Rehabilitation Services Pager: [phone] Office: [phone]

## 2018-10-28 NOTE — Clinical Social Work Note (Signed)
Spoke with Office manager at [facility]. She stated they do take patients back on the weekends but RN will have to come assess him first to make sure they can accommodate his needs. [place], RN is not in at the facility yet but activities coordinator will have her call CSW when she arrives.  Dayton Scrape, [place]

## 2018-10-28 NOTE — Progress Notes (Signed)
[facility] ROUNDING NOTE   Subjective:   Patient received dialysis 10/27/2018 with 3 L of fluid removed.  Blood pressure 113/52 pulse 60 temperature 97.8 O2 sats 89% nasal cannula 2 L  Sodium 136 potassium 3.9 chloride 100 CO2 28 glucose 83 BUN 24 creatinine 4.77 calcium 8.6 Albumin 2.8 phosphorus 5.5 WBC 7.1 hemoglobin 9.8 platelets 116   Objective:  Vital signs in last 24 hours:  Temp:  [97.4 F (36.3 C)-98.1 F (36.7 C)] 97.8 F (36.6 C) (11/01 1201) Pulse Rate:  [60-111] 60 (11/01 1201) Resp:  [14-21] 18 (11/01 1201) BP: (91-137)/(39-101) 113/52 (11/01 1201) SpO2:  [89 %-100 %] 89 % (11/01 1201) Weight:  [80.1 kg-93.4 kg] 93.4 kg (11/01 0430)  Weight change:  Filed Weights   10/27/18 1300 10/27/18 2200 10/28/18 0430  Weight: 85 kg 80.1 kg 93.4 kg    Intake/Output: I/O last 3 completed shifts: In: 202 [P.O.:717; IV Piggyback:200] Out: 3000 [Other:3000]   Intake/Output this shift:  Total I/O In: 180 [P.O.:180] Out: -   CVS-tachycardic RS- CTA slightly diminished at base ABD- BS present soft non-distended EXT- no edema left upper extremity AV fistula with thrill and bruit   Basic Metabolic Panel: Recent Labs  Lab 10/22/18 0606 10/23/18 0637 10/24/18 0614 10/25/18 0759 10/25/18 1448 10/26/18 0538 10/26/18 1954 10/28/18 0542  NA 141 142 142  --  143 143 138 136  K 5.3* 4.7 5.8*  --  4.1 4.2 5.1 3.9  CL 103 103 103  --  102 102 97* 100  CO2 21* 24 23  --  24 26 29 28   GLUCOSE 88 89 113*  --  86 90 134* 83  BUN 29* 43* 66*  --  33* 48* 57* 24*  CREATININE 4.22* 6.10* 7.80*  --  5.58* 6.97* 7.79* 4.77*  CALCIUM 9.1 9.8 9.8  --  9.1 9.2 9.1 8.6*  MG 2.2  --  2.5* 2.2  --  2.3  --   --   PHOS  --  4.2  --   --   --   --  5.5*  --     Liver Function Tests: Recent Labs  Lab 10/22/18 0606 10/23/18 0637 10/24/18 0614 10/25/18 0759 10/26/18 0538 10/26/18 1954  AST 47*  --  30 32 24  --   ALT 33  --  26 26 22   --   ALKPHOS 231*  --   206* 182* 170*  --   BILITOT 1.9*  --  1.7* 1.2 1.1  --   PROT 6.9  --  6.9 7.0 6.8  --   ALBUMIN 3.3* 3.2* 3.1* 3.1* 3.0* 2.8*   Recent Labs  Lab 10/24/18 0614  LIPASE 32   Recent Labs  Lab 10/22/18 1228 10/25/18 0759 10/26/18 0538  AMMONIA 42* 32 42*    CBC: Recent Labs  Lab 10/24/18 0614 10/25/18 0759 10/26/18 0538 10/26/18 1954 10/28/18 0542  WBC 10.7* 10.0 8.5 9.9 7.1  HGB 10.1* 10.3* 10.3* 10.0* 9.8*  HCT 33.7* 33.9* 33.4* 33.4* 32.0*  MCV 105.0* 103.7* 106.0* 106.7* 104.2*  PLT 128* 134* 121* 131* 116*    Cardiac Enzymes: No results for input(s): CKTOTAL, CKMB, CKMBINDEX, TROPONINI in the last 168 hours.  BNP: Invalid input(s): POCBNP  CBG: Recent Labs  Lab 10/27/18 1415 10/27/18 1624 10/27/18 2249 10/28/18 0757 10/28/18 1157  GLUCAP 123* 129* 83 78 114*    Microbiology: Results for orders placed or performed during the hospital encounter of 10/20/18  Blood Culture (  routine x 2)     Status: None   Collection Time: 10/20/18  5:10 PM  Result Value Ref Range Status   Specimen Description BLOOD RIGHT WRIST  Final   Special Requests   Final    BOTTLES DRAWN AEROBIC AND ANAEROBIC Blood Culture adequate volume   Culture   Final    NO GROWTH 5 DAYS Performed at [facility], [address] [place], [place] [address]    Report Status 10/25/2018 FINAL  Final  Blood Culture (routine x 2)     Status: None   Collection Time: 10/20/18  5:10 PM  Result Value Ref Range Status   Specimen Description BLOOD RIGHT HAND  Final   Special Requests   Final    BOTTLES DRAWN AEROBIC AND ANAEROBIC Blood Culture results may not be optimal due to an inadequate volume of blood received in culture bottles   Culture   Final    NO GROWTH 5 DAYS Performed at [facility], [place] [address] [place], [place] [address]    Report Status 10/25/2018 FINAL  Final  MRSA PCR Screening     Status: None   Collection Time: 10/21/18  3:49 AM  Result Value Ref Range  Status   MRSA by PCR NEGATIVE NEGATIVE Final    Comment:        The GeneXpert MRSA Assay (FDA approved for NASAL specimens only), is one component of a comprehensive MRSA colonization surveillance program. It is not intended to diagnose MRSA infection nor to guide or monitor treatment for MRSA infections. Performed at [facility], [place] [address], [place], [place] [address]   Respiratory Panel by PCR     Status: None   Collection Time: 10/22/18  5:03 PM  Result Value Ref Range Status   Adenovirus NOT DETECTED NOT DETECTED Final   Coronavirus 229E NOT DETECTED NOT DETECTED Final   Coronavirus HKU1 NOT DETECTED NOT DETECTED Final   Coronavirus NL63 NOT DETECTED NOT DETECTED Final   Coronavirus OC43 NOT DETECTED NOT DETECTED Final   Metapneumovirus NOT DETECTED NOT DETECTED Final   Rhinovirus / Enterovirus NOT DETECTED NOT DETECTED Final   Influenza A NOT DETECTED NOT DETECTED Final   Influenza B NOT DETECTED NOT DETECTED Final   Parainfluenza Virus 1 NOT DETECTED NOT DETECTED Final   Parainfluenza Virus 2 NOT DETECTED NOT DETECTED Final   Parainfluenza Virus 3 NOT DETECTED NOT DETECTED Final   Parainfluenza Virus 4 NOT DETECTED NOT DETECTED Final   Respiratory Syncytial Virus NOT DETECTED NOT DETECTED Final   Bordetella pertussis NOT DETECTED NOT DETECTED Final   Chlamydophila pneumoniae NOT DETECTED NOT DETECTED Final   Mycoplasma pneumoniae NOT DETECTED NOT DETECTED Final    Comment: Performed at [facility], [place] [address] [place], [place] [address]    Coagulation Studies: No results for input(s): LABPROT, INR in the last 72 hours.  Urinalysis: No results for input(s): COLORURINE, LABSPEC, PHURINE, GLUCOSEU, HGBUR, BILIRUBINUR, KETONESUR, PROTEINUR, UROBILINOGEN, NITRITE, LEUKOCYTESUR in the last 72 hours.  Invalid input(s): APPERANCEUR    Imaging: Ct Abdomen Pelvis Wo Contrast  Result Date: 10/27/2018 CLINICAL DATA:  n /v, pt denies abd pain history  of chronic kidney disease. EXAM: CT ABDOMEN AND PELVIS WITHOUT CONTRAST TECHNIQUE: Multidetector CT imaging of the abdomen and pelvis was performed following the standard protocol without IV contrast. COMPARISON:  09/25/2008 FINDINGS: Lower chest: There is a RIGHT pleural effusion. Ground-glass opacities are identified in the central perihilar regions bilaterally,  raising the question of pulmonary edema. There is dense coronary artery calcification. Heart size is normal. Hepatobiliary: Status post cholecystectomy. Liver is homogeneous without focal mass appear Pancreas: Unremarkable. No pancreatic ductal dilatation or surrounding inflammatory changes. Spleen: Normal in size without focal abnormality. Adrenals/Urinary Tract: There is prominence of the adrenal glands bilaterally, not associated with discrete mass. The kidneys are small bilaterally. There is atherosclerotic calcification of the renal arteries. A low-attenuation lesion is identified in the LOWER pole the LEFT kidney, too small to fully characterize. There is no hydronephrosis. Ureters are unremarkable. The bladder and visualized portion of the urethra are normal. Stomach/Bowel: The stomach and small bowel loops are normal in appearance. The appendix is well seen and has a normal appearance. There are scattered colonic diverticula but no acute diverticulitis. Significant stool burden. Vascular/Lymphatic: There is dense atherosclerotic calcification of the abdominal aorta. No retroperitoneal or mesenteric adenopathy. Reproductive: The prostate is enlarged, up lifting the urinary bladder. Other: No free pelvic fluid. Anterior abdominal wall is notable for a small fat containing paraumbilical hernia. There is mild diffuse body wall edema. Musculoskeletal: There is a new 2.1 centimeter lytic lesion within the MEDIAL aspect of the femoral head, adjacent to the fovea. This lesion does not abut a weight-bearing articular surface and there are no significant  degenerative changes in the hip. There is a 1.5 centimeter lytic lesion with than the superior aspect of L3. Lesions at the disc space of L3-4 and L4-5 are favored to represent Schmorl's nodes and are associated with degenerative changes. IMPRESSION: 1. RIGHT pleural effusion and ground-glass opacities in the lung bases consistent with pulmonary edema. 2. There are new osseous lytic lesions, 1 involving the RIGHT femoral head and a second involving the superior aspect of L3 vertebral body. These are suspicious for metastatic disease given their appearance. Consider bone scan for further evaluation. 3. Small kidneys, consistent with the history of chronic renal disease. 4. Colonic diverticulosis without acute diverticulitis. Significant stool burden. 5. Cholecystectomy. 6.  Aortic atherosclerosis.  (ICD10-I70.0) 7. Prostatic enlargement. 8. Small fat containing paraumbilical hernia. 9. Mild anasarca. Electronically Signed   By: Nolon Nations M.D.   On: 10/27/2018 12:38   Dg Chest 2 View  Result Date: 10/28/2018 CLINICAL DATA:  Shortness of breath and cough EXAM: CHEST - 2 VIEW COMPARISON:  October 22, 2018 chest radiograph and chest CT October 23, 2018 FINDINGS: There has been resolution of interstitial edema. The pleural effusion on the right seen on recent CT is not convincingly seen on this study. There is currently no appreciable edema or consolidation. There is mild cardiomegaly with pulmonary venous hypertension. There is aortic atherosclerosis. No adenopathy. No bone lesions. IMPRESSION: Pulmonary vascular congestion without frank edema or consolidation. There is aortic atherosclerosis. Aortic Atherosclerosis (ICD10-I70.0). Electronically Signed   By: Lowella Grip III M.D.   On: 10/28/2018 09:16     Medications:   . sodium chloride 250 mL (10/22/18 1033)  . cefTRIAXone (ROCEPHIN)  IV 1 g (10/27/18 2258)   . aspirin EC  325 mg Oral Daily  . azithromycin  500 mg Oral Q supper  . calcium  acetate  2,668 mg Oral TID WC  . Chlorhexidine Gluconate Cloth  6 each Topical Q0600  . Chlorhexidine Gluconate Cloth  6 each Topical Q0600  . collagenase   Topical Daily  . metoprolol tartrate  50 mg Oral BID  . saccharomyces boulardii  250 mg Oral BID   sodium chloride, acetaminophen, hydrOXYzine, ipratropium-albuterol, loperamide, ondansetron (ZOFRAN) IV  Assessment/ Plan:  1. AMS - BC's no growth for 3 days. Lactic acid improved to 1.5. Ammonia elevated, 75. Negative CT head and CXR. Abdominal USunrevealing. On ABX. Per primary. CT abdomen right pleural effusion creatinine loss opacities central perihilar regions consistent with pulmonary edema dense coronary artery calcification.  Hepatobiliary shows status post cholecystectomy liver homogenous without focal masses pancreas shows no ductal dilatation is unremarkable.  Stomach and small bowel loops are normal prominent bilateral adrenal glands with no evidence of masses.  Small bilateral kidneys.  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  Is scheduled for radionucleotide bone scan.  Would recommend PSA.  If not done 2. Acute hypoxic respiratory failure- on 2 L nasal cannula.  Will challenge edw tomorrow with HD but may be related to PNA.  Patient started on azithromycin and ceftriaxone day 5 Will follow after UF tomorrow. CT chest reveals moderate right pleural effusion upper lobe predominant interstitial thickening with interval groundglass opacities consistent with pulmonary edema or inflammation or atypical infection.  Small quantities of pleural fluid noted on ultrasound not thought to be able to perform thoracentesis 3. ESRD - On HD MWF. Tolerated HD well on 10/24/2018 Plan to keep on schedule.Removed 3.L with dialysis 10/27/2018 will continue to challenge with dialysis for fluid removal next dialysis treatment 10/28/2018. 4. Hypertension/volume - BP  improved.  Continue to challenge dry weight 5. Anemia of CKD - Hgb 10.1 6. Secondary Hyperparathyroidism - Ca and phos at goal. Continue VDRA and binders. 7. Nutrition - Renal diet with fluid restrictions. Nepro.  8. DMT2 - per primary 9. Diastolic dysfunction/aortic stenosis 10. OSA 11. A fib - not on anticoagulation 12. Hx GIB 2/2 coumadin    LOS: 7 Sherril Croon @TODAY @12 :28 PM

## 2018-10-28 NOTE — Evaluation (Signed)
Physical Therapy Evaluation Patient Details Name: [name] MRN: [id number] DOB: 12-08-1941 Today's Date: 10/28/2018   History of Present Illness  77yo male sent to the ED from his SNF due to AMS and BP 60/40. Head CT negative for acute changes. Diagnosed with sepsis. PMH A-fib, CHF, CKD, DM, HTN, on HD x3/week   Clinical Impression   Patient received in bed, pleasantly confused and appearing to be A&Ox2 this morning; able to convince patient to participate in therapy this morning. He requires ModA for functional bed mobility due to chronic back pain and gross deconditioning, once up at EOB able to maintain with Min guard and B UE support. Attempted sit to stand with RW but patient very weak and shaky with this device, unable to move feet. Changed to bear-hug technique and able to perform stand-pivot to chair with ModA and cues for safety. Placed pillow under his buttocks for cushioning due to chronic buttock sores, also floated heels in chair; RN aware of buttock sores and that patient's time in chair should be limited due to these. He was left up in the chair with all needs met, chair alarm activated, and RN present and attending. He will continue to benefit from skilled PT services in the acute setting as well as skilled services in the ST-SNF setting moving forward.     Follow Up Recommendations SNF    Equipment Recommendations  Other (comment)(defer to next venue )    Recommendations for Other Services       Precautions / Restrictions Precautions Precautions: Fall;Other (comment) Precaution Comments: chronic wounds on buttocks and feet  Restrictions Weight Bearing Restrictions: No      Mobility  Bed Mobility Overal bed mobility: Needs Assistance Bed Mobility: Rolling;Sidelying to Sit Rolling: Min assist Sidelying to sit: Mod assist       General bed mobility comments: MinA and Mod VC for rolling, ModA/Mod VC to elevate trunk to upright; used log rolling technique due  to back pain   Transfers Overall transfer level: Needs assistance Equipment used: Rolling walker (2 wheeled);None Transfers: Sit to/from [facility] Sit to Stand: Mod assist Stand pivot transfers: Mod assist       General transfer comment: performed sit to stand with ModA and RW, patient very shakey and with difficulty in moving feet to perform transfer; changed to bear-hug technique for stand-pivot to chair and able to perform with [place]   Ambulation/Gait             General Gait Details: deferred   Stairs            Wheelchair Mobility    Modified Rankin (Stroke Patients Only)       Balance Overall balance assessment: Needs assistance Sitting-balance support: Bilateral upper extremity supported;Feet supported Sitting balance-Leahy Scale: Fair     Standing balance support: Bilateral upper extremity supported;During functional activity Standing balance-Leahy Scale: Poor                               Pertinent Vitals/Pain Pain Assessment: Faces Faces Pain Scale: Hurts even more Pain Location: L LE and buttocks  Pain Descriptors / Indicators: Aching;Sore Pain Intervention(s): Limited activity within patient's tolerance;Monitored during session;Repositioned    Home Living Family/patient expects to be discharged to:: Skilled nursing facility                 Additional Comments: family not present to provide PLOF/equipment  history  Prior Function           Comments: family not present to provide PLOF/equipment history      Hand Dominance        Extremity/Trunk Assessment   Upper Extremity Assessment Upper Extremity Assessment: Defer to OT evaluation    Lower Extremity Assessment Lower Extremity Assessment: Generalized weakness    Cervical / Trunk Assessment Cervical / Trunk Assessment: Normal  Communication   Communication: No difficulties  Cognition Arousal/Alertness: Awake/alert Behavior During  Therapy: Flat affect Overall Cognitive Status: No family/caregiver present to determine baseline cognitive functioning Area of Impairment: Orientation;Attention;Memory;Safety/judgement;Awareness;Problem solving                 Orientation Level: Disoriented to;Time;Situation Current Attention Level: Selective Memory: Decreased short-term memory   Safety/Judgement: Decreased awareness of safety;Decreased awareness of deficits Awareness: Intellectual Problem Solving: Decreased initiation;Difficulty sequencing;Requires verbal cues        General Comments General comments (skin integrity, edema, etc.): chornic wounds on feet and buttocks; placed on pillow in chair and floated heels while in recliner     Exercises     Assessment/Plan    PT Assessment Patient needs continued PT services  PT Problem List Decreased strength;Decreased mobility;Decreased safety awareness;Decreased coordination;Decreased activity tolerance;Decreased cognition;Decreased balance       PT Treatment Interventions DME instruction;Therapeutic activities;Gait training;Therapeutic exercise;Patient/family education;Stair training;Balance training;Functional mobility training;Neuromuscular re-education    PT Goals (Current goals can be found in the Care Plan section)  Acute Rehab PT Goals Patient Stated Goal: to feel better  PT Goal Formulation: With patient Time For Goal Achievement: 11/11/18 Potential to Achieve Goals: Fair    Frequency Min 2X/week   Barriers to discharge        Co-evaluation               AM-PAC PT "6 Clicks" Daily Activity  Outcome Measure Difficulty turning over in bed (including adjusting bedclothes, sheets and blankets)?: Unable Difficulty moving from lying on back to sitting on the side of the bed? : Unable Difficulty sitting down on and standing up from a chair with arms (e.g., wheelchair, bedside commode, etc,.)?: Unable Help needed moving to and from a bed to chair  (including a wheelchair)?: A Lot Help needed walking in hospital room?: A Lot Help needed climbing 3-5 steps with a railing? : Total 6 Click Score: 8    End of Session Equipment Utilized During Treatment: Gait belt;Oxygen Activity Tolerance: Patient tolerated treatment well Patient left: in chair;with call bell/phone within reach;with chair alarm set;with nursing/sitter in room Nurse Communication: Mobility status PT Visit Diagnosis: Unsteadiness on feet (R26.81);Muscle weakness (generalized) (M62.81);Difficulty in walking, not elsewhere classified (R26.2)    Time: 0160-1093 PT Time Calculation (min) (ACUTE ONLY): 31 min   Charges:   PT Evaluation $PT Eval Moderate Complexity: 1 Mod PT Treatments $Therapeutic Activity: 8-22 mins        Deniece Ree PT, DPT, CBIS  Supplemental Physical Therapist [place]    Pager [phone] Acute Rehab Office [phone]

## 2018-10-28 NOTE — Progress Notes (Signed)
[facility] PROGRESS NOTE  [name] EHU:[id number] DOB: Oct 24, 1941 DOA: 10/20/2018  PCP: Patient, No Pcp Per  Brief History/Interval Summary: 77 y.o.malewith medical history significant ofend-stage renal disease on hemodialysis Monday Wednesday Friday, A. fib, CHF, diabetes,hypertension, hyperlipidemia,OSA presented to the hospital from his nursing home for further evaluation of altered mental status. He presented with hypotension and AMS concerning for sepsis.  He was started on broad spectrum antibiotics.  He was noted to have an oxygen requirement and had a CT concerning for edema vs inflammation or atypical infection.  He was started on coverage for CAP.  The CT was also notable for a moderate R sided effusion.  IR was consulted, but there was not enough to tap.  He was also noted to have an elevated ammonia, but he's had intermittent diarrhea prior to presentation and worsening diarrhea since lactlose was started.    Consultants: Nephrology.  Procedures: Hemodialysis  Antibiotics: Patient received broad-spectrum coverage with vancomycin cefepime and Flagyl. Currently on ceftriaxone and azithromycin, 10/27  Subjective/Interval History: Patient denies any complaints this morning.  Denies any shortness of breath.  Not having frequent bowel movements as before.  Denies any abdominal pain.  ROS: Denies any chest pain.  Objective:  Vital Signs  Vitals:   10/27/18 2247 10/28/18 0430 10/28/18 1007 10/28/18 1201  BP: 112/66 (!) 91/57 (!) 93/48 (!) 113/52  Pulse: 93 80 75 60  Resp: 16 18  18   Temp: 97.9 F (36.6 C) 98.1 F (36.7 C)  97.8 F (36.6 C)  TempSrc: Oral Oral  Oral  SpO2: 99% 100%  (!) 89%  Weight:  93.4 kg    Height:        Intake/Output Summary (Last 24 hours) at 10/28/2018 1311 Last data filed at 10/28/2018 0815 Gross per 24 hour  Intake 517 ml  Output 3000 ml  Net -2483 ml   Filed Weights   10/27/18 1300 10/27/18 2200 10/28/18 0430    Weight: 85 kg 80.1 kg 93.4 kg    General appearance: Awake alert.  In no distress. Resp: Coarse breath sounds bilaterally.  Normal effort.  No wheezing rales or rhonchi. Cardio: S1-S2 is normal regular.  No S3-S4.  No rubs murmurs or bruit GI: Abdomen remains soft.  Nontender nondistended.  No masses organomegaly.  Bowel sounds are present normal. Extremities: No edema Neurologic: Awake alert.  In no distress  Lab Results:  Data Reviewed: I have personally reviewed following labs and imaging studies  CBC: Recent Labs  Lab 10/24/18 0614 10/25/18 0759 10/26/18 0538 10/26/18 1954 10/28/18 0542  WBC 10.7* 10.0 8.5 9.9 7.1  HGB 10.1* 10.3* 10.3* 10.0* 9.8*  HCT 33.7* 33.9* 33.4* 33.4* 32.0*  MCV 105.0* 103.7* 106.0* 106.7* 104.2*  PLT 128* 134* 121* 131* 116*    Basic Metabolic Panel: Recent Labs  Lab 10/22/18 0606 10/23/18 0637 10/24/18 0614 10/25/18 0759 10/25/18 1448 10/26/18 0538 10/26/18 1954 10/28/18 0542  NA 141 142 142  --  143 143 138 136  K 5.3* 4.7 5.8*  --  4.1 4.2 5.1 3.9  CL 103 103 103  --  102 102 97* 100  CO2 21* 24 23  --  24 26 29 28   GLUCOSE 88 89 113*  --  86 90 134* 83  BUN 29* 43* 66*  --  33* 48* 57* 24*  CREATININE 4.22* 6.10* 7.80*  --  5.58* 6.97* 7.79* 4.77*  CALCIUM 9.1 9.8 9.8  --  9.1 9.2 9.1 8.6*  MG  2.2  --  2.5* 2.2  --  2.3  --   --   PHOS  --  4.2  --   --   --   --  5.5*  --     GFR: Estimated Creatinine Clearance: 13.9 mL/min (A) (by C-G formula based on SCr of 4.77 mg/dL (H)).  Liver Function Tests: Recent Labs  Lab 10/22/18 0606 10/23/18 0637 10/24/18 0614 10/25/18 0759 10/26/18 0538 10/26/18 1954  AST 47*  --  30 32 24  --   ALT 33  --  26 26 22   --   ALKPHOS 231*  --  206* 182* 170*  --   BILITOT 1.9*  --  1.7* 1.2 1.1  --   PROT 6.9  --  6.9 7.0 6.8  --   ALBUMIN 3.3* 3.2* 3.1* 3.1* 3.0* 2.8*    Recent Labs  Lab 10/24/18 0614  LIPASE 32   Recent Labs  Lab 10/22/18 1228 10/25/18 0759 10/26/18 0538   AMMONIA 42* 32 42*    Coagulation Profile: Recent Labs  Lab 10/22/18 0606 10/24/18 0614  INR 1.52 1.58    CBG: Recent Labs  Lab 10/27/18 1415 10/27/18 1624 10/27/18 2249 10/28/18 0757 10/28/18 1157  GLUCAP 123* 129* 83 78 114*     Recent Results (from the past 240 hour(s))  Blood Culture (routine x 2)     Status: None   Collection Time: 10/20/18  5:10 PM  Result Value Ref Range Status   Specimen Description BLOOD RIGHT WRIST  Final   Special Requests   Final    BOTTLES DRAWN AEROBIC AND ANAEROBIC Blood Culture adequate volume   Culture   Final    NO GROWTH 5 DAYS Performed at [facility], [place] [address] [place], [place] [address]    Report Status 10/25/2018 FINAL  Final  Blood Culture (routine x 2)     Status: None   Collection Time: 10/20/18  5:10 PM  Result Value Ref Range Status   Specimen Description BLOOD RIGHT HAND  Final   Special Requests   Final    BOTTLES DRAWN AEROBIC AND ANAEROBIC Blood Culture results may not be optimal due to an inadequate volume of blood received in culture bottles   Culture   Final    NO GROWTH 5 DAYS Performed at [facility], [place] [address] [place], [place] [address]    Report Status 10/25/2018 FINAL  Final  MRSA PCR Screening     Status: None   Collection Time: 10/21/18  3:49 AM  Result Value Ref Range Status   MRSA by PCR NEGATIVE NEGATIVE Final    Comment:        The GeneXpert MRSA Assay (FDA approved for NASAL specimens only), is one component of a comprehensive MRSA colonization surveillance program. It is not intended to diagnose MRSA infection nor to guide or monitor treatment for MRSA infections. Performed at [facility], [place] [address], [place], [place] [address]   Respiratory Panel by PCR     Status: None   Collection Time: 10/22/18  5:03 PM  Result Value Ref Range Status   Adenovirus NOT DETECTED NOT DETECTED Final   Coronavirus 229E NOT DETECTED NOT DETECTED Final   Coronavirus  HKU1 NOT DETECTED NOT DETECTED Final   Coronavirus NL63 NOT DETECTED NOT DETECTED Final   Coronavirus OC43 NOT DETECTED NOT DETECTED Final   Metapneumovirus NOT DETECTED NOT DETECTED Final   Rhinovirus / Enterovirus NOT DETECTED  NOT DETECTED Final   Influenza A NOT DETECTED NOT DETECTED Final   Influenza B NOT DETECTED NOT DETECTED Final   Parainfluenza Virus 1 NOT DETECTED NOT DETECTED Final   Parainfluenza Virus 2 NOT DETECTED NOT DETECTED Final   Parainfluenza Virus 3 NOT DETECTED NOT DETECTED Final   Parainfluenza Virus 4 NOT DETECTED NOT DETECTED Final   Respiratory Syncytial Virus NOT DETECTED NOT DETECTED Final   Bordetella pertussis NOT DETECTED NOT DETECTED Final   Chlamydophila pneumoniae NOT DETECTED NOT DETECTED Final   Mycoplasma pneumoniae NOT DETECTED NOT DETECTED Final    Comment: Performed at [facility], [place] [address] [place], [place] [address]      Radiology Studies: Ct Abdomen Pelvis Wo Contrast  Result Date: 10/27/2018 CLINICAL DATA:  n /v, pt denies abd pain history of chronic kidney disease. EXAM: CT ABDOMEN AND PELVIS WITHOUT CONTRAST TECHNIQUE: Multidetector CT imaging of the abdomen and pelvis was performed following the standard protocol without IV contrast. COMPARISON:  09/25/2008 FINDINGS: Lower chest: There is a RIGHT pleural effusion. Ground-glass opacities are identified in the central perihilar regions bilaterally, raising the question of pulmonary edema. There is dense coronary artery calcification. Heart size is normal. Hepatobiliary: Status post cholecystectomy. Liver is homogeneous without focal mass appear Pancreas: Unremarkable. No pancreatic ductal dilatation or surrounding inflammatory changes. Spleen: Normal in size without focal abnormality. Adrenals/Urinary Tract: There is prominence of the adrenal glands bilaterally, not associated with discrete mass. The kidneys are small bilaterally. There is atherosclerotic calcification of the renal  arteries. A low-attenuation lesion is identified in the LOWER pole the LEFT kidney, too small to fully characterize. There is no hydronephrosis. Ureters are unremarkable. The bladder and visualized portion of the urethra are normal. Stomach/Bowel: The stomach and small bowel loops are normal in appearance. The appendix is well seen and has a normal appearance. There are scattered colonic diverticula but no acute diverticulitis. Significant stool burden. Vascular/Lymphatic: There is dense atherosclerotic calcification of the abdominal aorta. No retroperitoneal or mesenteric adenopathy. Reproductive: The prostate is enlarged, up lifting the urinary bladder. Other: No free pelvic fluid. Anterior abdominal wall is notable for a small fat containing paraumbilical hernia. There is mild diffuse body wall edema. Musculoskeletal: There is a new 2.1 centimeter lytic lesion within the MEDIAL aspect of the femoral head, adjacent to the fovea. This lesion does not abut a weight-bearing articular surface and there are no significant degenerative changes in the hip. There is a 1.5 centimeter lytic lesion with than the superior aspect of L3. Lesions at the disc space of L3-4 and L4-5 are favored to represent Schmorl's nodes and are associated with degenerative changes. IMPRESSION: 1. RIGHT pleural effusion and ground-glass opacities in the lung bases consistent with pulmonary edema. 2. There are new osseous lytic lesions, 1 involving the RIGHT femoral head and a second involving the superior aspect of L3 vertebral body. These are suspicious for metastatic disease given their appearance. Consider bone scan for further evaluation. 3. Small kidneys, consistent with the history of chronic renal disease. 4. Colonic diverticulosis without acute diverticulitis. Significant stool burden. 5. Cholecystectomy. 6.  Aortic atherosclerosis.  (ICD10-I70.0) 7. Prostatic enlargement. 8. Small fat containing paraumbilical hernia. 9. Mild anasarca.  Electronically Signed   By: Nolon Nations M.D.   On: 10/27/2018 12:38   Dg Chest 2 View  Result Date: 10/28/2018 CLINICAL DATA:  Shortness of breath and cough EXAM: CHEST - 2 VIEW COMPARISON:  October 22, 2018 chest radiograph and chest CT October 23, 2018 FINDINGS: There has been resolution of interstitial edema. The pleural effusion on the right seen on recent CT is not convincingly seen on this study. There is currently no appreciable edema or consolidation. There is mild cardiomegaly with pulmonary venous hypertension. There is aortic atherosclerosis. No adenopathy. No bone lesions. IMPRESSION: Pulmonary vascular congestion without frank edema or consolidation. There is aortic atherosclerosis. Aortic Atherosclerosis (ICD10-I70.0). Electronically Signed   By: Lowella Grip III M.D.   On: 10/28/2018 09:16     Medications:  Scheduled: . aspirin EC  325 mg Oral Daily  . azithromycin  500 mg Oral Q supper  . calcium acetate  2,668 mg Oral TID WC  . Chlorhexidine Gluconate Cloth  6 each Topical Q0600  . Chlorhexidine Gluconate Cloth  6 each Topical Q0600  . collagenase   Topical Daily  . metoprolol tartrate  50 mg Oral BID  . saccharomyces boulardii  250 mg Oral BID   Continuous: . sodium chloride 250 mL (10/22/18 1033)  . cefTRIAXone (ROCEPHIN)  IV 1 g (10/27/18 2258)   ZYS:AYTKZS chloride, acetaminophen, hydrOXYzine, ipratropium-albuterol, loperamide, ondansetron (ZOFRAN) IV    Assessment/Plan:  Acute hypoxic respiratory failure This patient is status appears to be improving.  Still remains on oxygen by nasal cannula.  We will need to check room air saturations after his dialysis session today.  CT scan of the chest done during this hospitalization did show groundglass opacity concerning for pulmonary edema versus atypical infection.  Patient remains on ceftriaxone and azithromycin.  He will complete the course tomorrow.   Chest x-ray was repeated this morning and shows  improvement in interstitial edema.  Lytic lesions CT scan of the abdomen pelvis did show lytic lesions in the right femoral head as well as L3 vertebral body.  Patient without any known history of cancer.  These findings were discussed with the patient.  Proceed with bone scan.  Enlarged prostate also noted on CT scan.  Check PSA.  Sepsis with elevated lactic acid level and hypotension Cultures negative.  WBC has been normal.  We will allow him to complete the course of his antibiotics tomorrow.  No longer has sepsis physiology.    Acute metabolic encephalopathy Alteration in mental status most likely due to metabolic derangements as well as hypoxia.  Mental status now back to baseline.  There was also some concern for hyperammonemia.  He was given lactulose resulting in explosive diarrhea.  Normal B12 level noted.  LFTs have been normal.  No further work-up necessary at this time.    Elevated ammonia level Etiology unclear.  There was some concern for liver cirrhosis due to thrombocytopenia low albumin and elevated INR.  Lactulose currently on hold due to diarrhea.  CT scan of the abdomen did not show any abnormalities in the liver.  Nausea and vomiting The symptoms have resolved.  CT findings reviewed with patient.  Diarrhea Most likely due to lactulose.  Appears to have improved.  CT scan did shows significant stool burden.  Is quite likely patient had been constipated for a while.  We will not give him any more Imodium.  Allow him to have normal bowel movements.  Check TSH.  Elevated alkaline phosphatase Right upper quadrant ultrasound was unremarkable.  No abnormalities noted in the hepatobiliary system on Ct scan.  He is status post cholecystectomy..  Right-sided pleural effusion Thoracentesis was ordered however no significant fluid was noted when he went down for the procedure.  No significant effusion noted on chest  x-ray from this morning.  End-stage renal disease on hemodialysis  on Monday Wednesday and Friday Management per nephrology.  Underwent dialysis yesterday.  Plan is for treatment today which is his usual dialysis day.  Paroxysmal atrial fibrillation Mild RVR noted during this hospitalization.  Metoprolol dose aws increased to 100 mg twice daily.  Rate is controlled.  Not noted to be on anticoagulation.  As per cardiology note from 2018 he is not on warfarin due to history of GI bleed.  Dose of metoprolol was reduced due to borderline low blood pressures.  Chronic diastolic CHF Stable.  Volume being managed with hemodialysis.  Diabetes mellitus type 2 HbA1c 4.2.  Essential hypertension Borderline low blood pressures noted.  Continue to monitor.  He is asymptomatic.  Obstructive sleep apnea Continue CPAP  Anemia of chronic disease Stable.  Thrombocytopenia Stable.  No evidence of overt bleeding.   DVT Prophylaxis: SCD;s    Code Status: Full code Family Communication: Discussed with the patient Disposition Plan: Patient lives in an assisted living facility.  Bone scan ordered.    LOS: 7 days   [facility] Pager [phone] 10/28/2018, 1:11 PM  If 7PM-7AM, please contact night-coverage at [email or web address], password [facility]

## 2018-10-28 NOTE — Progress Notes (Signed)
Refused CPAP for the night

## 2018-10-29 LAB — CBC
HCT: 32.7 % — ABNORMAL LOW (ref 39.0–52.0)
HCT: 31.5 % — ABNORMAL LOW (ref 39.0–52.0)
HEMOGLOBIN: 10.4 g/dL — AB (ref 13.0–17.0)
Hemoglobin: 9.4 g/dL — ABNORMAL LOW (ref 13.0–17.0)
MCH: 33.1 pg (ref 26.0–34.0)
MCH: 31.3 pg (ref 26.0–34.0)
MCHC: 31.8 g/dL (ref 30.0–36.0)
MCHC: 29.8 g/dL — ABNORMAL LOW (ref 30.0–36.0)
MCV: 104.1 fL — ABNORMAL HIGH (ref 80.0–100.0)
MCV: 105 fL — ABNORMAL HIGH (ref 80.0–100.0)
NRBC: 0.4 % — AB (ref 0.0–0.2)
PLATELETS: 126 10*3/uL — AB (ref 150–400)
PLATELETS: 125 10*3/uL — AB (ref 150–400)
RBC: 3.14 MIL/uL — AB (ref 4.22–5.81)
RBC: 3 MIL/uL — AB (ref 4.22–5.81)
RDW: 18.7 % — ABNORMAL HIGH (ref 11.5–15.5)
RDW: 18.7 % — ABNORMAL HIGH (ref 11.5–15.5)
WBC: 8.6 10*3/uL (ref 4.0–10.5)
WBC: 7.8 10*3/uL (ref 4.0–10.5)
nRBC: 0.6 % — ABNORMAL HIGH (ref 0.0–0.2)

## 2018-10-29 LAB — RENAL FUNCTION PANEL
ANION GAP: 9 (ref 5–15)
Albumin: 2.9 g/dL — ABNORMAL LOW (ref 3.5–5.0)
BUN: 41 mg/dL — ABNORMAL HIGH (ref 8–23)
CALCIUM: 9.1 mg/dL (ref 8.9–10.3)
CO2: 27 mmol/L (ref 22–32)
CREATININE: 6.05 mg/dL — AB (ref 0.61–1.24)
Chloride: 98 mmol/L (ref 98–111)
GFR, EST AFRICAN AMERICAN: 9 mL/min — AB (ref >60)
GFR, EST NON AFRICAN AMERICAN: 8 mL/min — AB (ref >60)
Glucose, Bld: 115 mg/dL — ABNORMAL HIGH (ref 70–99)
Phosphorus: 4.5 mg/dL (ref 2.5–4.6)
Potassium: 4.7 mmol/L (ref 3.5–5.1)
Sodium: 134 mmol/L — ABNORMAL LOW (ref 135–145)

## 2018-10-29 LAB — GLUCOSE, CAPILLARY
GLUCOSE-CAPILLARY: 142 mg/dL — AB (ref 70–99)
Glucose-Capillary: 117 mg/dL — ABNORMAL HIGH (ref 70–99)
Glucose-Capillary: 117 mg/dL — ABNORMAL HIGH (ref 70–99)
Glucose-Capillary: 111 mg/dL — ABNORMAL HIGH (ref 70–99)

## 2018-10-29 MED ORDER — SODIUM CHLORIDE 0.9 % IV SOLN
1.0000 g | Freq: Once | INTRAVENOUS | Status: AC
  Administered 2018-10-29: 1 g via INTRAVENOUS
  Filled 2018-10-29 (×2): qty 10

## 2018-10-29 MED ORDER — METOPROLOL TARTRATE 50 MG PO TABS: 50.0000 mg | ORAL_TABLET | Freq: Two times a day (BID) | ORAL | 0 refills | Status: DC

## 2018-10-29 MED ORDER — AZITHROMYCIN 500 MG PO TABS
500.0000 mg | ORAL_TABLET | Freq: Once | ORAL | Status: AC
  Administered 2018-10-29: 500 mg via ORAL
  Filled 2018-10-29: qty 1

## 2018-10-29 NOTE — Progress Notes (Signed)
SATURATION QUALIFICATIONS: (This note is used to comply with regulatory documentation for home oxygen)  Patient Saturations on Room Air at Rest = 100%

## 2018-10-29 NOTE — Progress Notes (Signed)
HD tx stopped @ approximately 0227 (1 hr 23 min early) d/t emergent issue. Last visual check was made on pt at 0215 at approximately 0218 I stepped away from the pt's immediate area (the bay) to the other side of the unit @ approximately 0223 I heard the machine alarming, went to pt's bedside to address the alarm found pt had pulled out his arterial needle completely w/ blood spurting from cannulation site and pt was picking at venous needle tape.  pulled his hand away from the venous site I saw that the venous needle was almost completely out and blood was flowing from the cannulation site around what needle tip was left in the pt.  Bleeding was stopped easily.  Pt refused post VS check at first and I reported that to primary nurse but prior to him leaving unit he allowed me to take a manual bp Unable to get final data from machine as machine was turned off during the situation, however, pt was running w/o problems during his tx and made it to the half way point Pt had received 250 ml NS prime and 100 ml NS flush during this tx, he was unable to get his blood returned to him, approx 250 ml.  Therefore I approximate that he had a UF removed of 1400 ml (If at the halfway point he should have been at a UF removed of 1750 ml (machine set for 3500) - 350 ml NS received = 1400 ml  approximately removed Dr. Justin Mend was called and made aware of situation @ 0305, orders received for another stat CBC to be drawn once pt returns to floor  Report called to Larina Bras, RN

## 2018-10-29 NOTE — Progress Notes (Signed)
Will not release orders at this time, states it is for hemodialysis department only

## 2018-10-29 NOTE — Progress Notes (Signed)
HD tx initiated via 15Gx2 w/o problem Pull/push/flush well w/o problem VSS Will continue to monitor while on HD tx 

## 2018-10-29 NOTE — Progress Notes (Signed)
CSW visited pt room who states he should be able to return back to facility([facility]) and refuses SNF. [facility] is under impression pt will need SNF and will not accept until assessed by their nurse. Multiple attempts to contact daughter to assist with discharge. Again pt is not amenable to going to a SNF facility. Facility nurse will be unable to assess pt's return back to facility until Monday. Will continue to attempt to reach daughter to help assist route of disposition.

## 2018-10-29 NOTE — Progress Notes (Signed)
[facility] ROUNDING NOTE   Subjective:   Patient received dialysis 10/28/2018 1.4 L removed dialysis was extended into the nighttime 10/29/2018.  It appears that there was some needle dislodgment.  Some blood loss also noted  Blood pressure 103/60 pulse 75 temperature 7.6 O2 sats 94% room air  Sodium 134 potassium 4.7 chloride 98 CO2 is 27 BUN 41 creatinine 6.05 calcium 9.1 phosphorus 4.5 albumin 2.9 WBC 7.8 hemoglobin 9.4 platelets 125   Objective:  Vital signs in last 24 hours:  Temp:  [97.6 F (36.4 C)-97.9 F (36.6 C)] 97.6 F (36.4 C) (11/02 0937) Pulse Rate:  [60-91] 75 (11/02 0937) Resp:  [10-20] 11 (11/02 0315) BP: (101-126)/(52-87) 103/60 (11/02 0937) SpO2:  [89 %-100 %] 94 % (11/02 0937) Weight:  [81.5 kg-82.7 kg] 81.5 kg (11/02 0315)  Weight change: -2.3 kg Filed Weights   10/28/18 0430 10/29/18 0013 10/29/18 0315  Weight: 93.4 kg 82.7 kg 81.5 kg    Intake/Output: I/O last 3 completed shifts: In: 617 [P.O.:417; IV Piggyback:200] Out: 9150 [Other:4400; Stool:4]   Intake/Output this shift:  Total I/O In: 240 [P.O.:240] Out: 1 [Stool:1]  CVS-tachycardic RS- CTA slightly diminished at base ABD- BS present soft non-distended EXT- no edema left upper extremity AV fistula with thrill and bruit   Basic Metabolic Panel: Recent Labs  Lab 10/23/18 0637 10/24/18 0614 10/25/18 0759 10/25/18 1448 10/26/18 0538 10/26/18 1954 10/28/18 0542 10/29/18 0106  NA 142 142  --  143 143 138 136 134*  K 4.7 5.8*  --  4.1 4.2 5.1 3.9 4.7  CL 103 103  --  102 102 97* 100 98  CO2 24 23  --  24 26 29 28 27   GLUCOSE 89 113*  --  86 90 134* 83 115*  BUN 43* 66*  --  33* 48* 57* 24* 41*  CREATININE 6.10* 7.80*  --  5.58* 6.97* 7.79* 4.77* 6.05*  CALCIUM 9.8 9.8  --  9.1 9.2 9.1 8.6* 9.1  MG  --  2.5* 2.2  --  2.3  --   --   --   PHOS 4.2  --   --   --   --  5.5*  --  4.5    Liver Function Tests: Recent Labs  Lab 10/24/18 0614 10/25/18 0759  10/26/18 0538 10/26/18 1954 10/29/18 0106  AST 30 32 24  --   --   ALT 26 26 22   --   --   ALKPHOS 206* 182* 170*  --   --   BILITOT 1.7* 1.2 1.1  --   --   PROT 6.9 7.0 6.8  --   --   ALBUMIN 3.1* 3.1* 3.0* 2.8* 2.9*   Recent Labs  Lab 10/24/18 0614  LIPASE 32   Recent Labs  Lab 10/22/18 1228 10/25/18 0759 10/26/18 0538  AMMONIA 42* 32 42*    CBC: Recent Labs  Lab 10/26/18 0538 10/26/18 1954 10/28/18 0542 10/29/18 0106 10/29/18 0431  WBC 8.5 9.9 7.1 8.6 7.8  HGB 10.3* 10.0* 9.8* 10.4* 9.4*  HCT 33.4* 33.4* 32.0* 32.7* 31.5*  MCV 106.0* 106.7* 104.2* 104.1* 105.0*  PLT 121* 131* 116* 126* 125*    Cardiac Enzymes: No results for input(s): CKTOTAL, CKMB, CKMBINDEX, TROPONINI in the last 168 hours.  BNP: Invalid input(s): POCBNP  CBG: Recent Labs  Lab 10/28/18 0757 10/28/18 1157 10/28/18 1703 10/28/18 2101 10/29/18 0755  GLUCAP 78 114* 119* 169* 117*    Microbiology: Results for orders placed or  performed during the hospital encounter of 10/20/18  Blood Culture (routine x 2)     Status: None   Collection Time: 10/20/18  5:10 PM  Result Value Ref Range Status   Specimen Description BLOOD RIGHT WRIST  Final   Special Requests   Final    BOTTLES DRAWN AEROBIC AND ANAEROBIC Blood Culture adequate volume   Culture   Final    NO GROWTH 5 DAYS Performed at [facility], [address], [place], [place] [address]    Report Status 10/25/2018 FINAL  Final  Blood Culture (routine x 2)     Status: None   Collection Time: 10/20/18  5:10 PM  Result Value Ref Range Status   Specimen Description BLOOD RIGHT HAND  Final   Special Requests   Final    BOTTLES DRAWN AEROBIC AND ANAEROBIC Blood Culture results may not be optimal due to an inadequate volume of blood received in culture bottles   Culture   Final    NO GROWTH 5 DAYS Performed at [facility], [place] [address], [place], [place] [address]    Report Status 10/25/2018 FINAL  Final  MRSA PCR  Screening     Status: None   Collection Time: 10/21/18  3:49 AM  Result Value Ref Range Status   MRSA by PCR NEGATIVE NEGATIVE Final    Comment:        The GeneXpert MRSA Assay (FDA approved for NASAL specimens only), is one component of a comprehensive MRSA colonization surveillance program. It is not intended to diagnose MRSA infection nor to guide or monitor treatment for MRSA infections. Performed at [facility], [place] [address], [place], [place] [address]   Respiratory Panel by PCR     Status: None   Collection Time: 10/22/18  5:03 PM  Result Value Ref Range Status   Adenovirus NOT DETECTED NOT DETECTED Final   Coronavirus 229E NOT DETECTED NOT DETECTED Final   Coronavirus HKU1 NOT DETECTED NOT DETECTED Final   Coronavirus NL63 NOT DETECTED NOT DETECTED Final   Coronavirus OC43 NOT DETECTED NOT DETECTED Final   Metapneumovirus NOT DETECTED NOT DETECTED Final   Rhinovirus / Enterovirus NOT DETECTED NOT DETECTED Final   Influenza A NOT DETECTED NOT DETECTED Final   Influenza B NOT DETECTED NOT DETECTED Final   Parainfluenza Virus 1 NOT DETECTED NOT DETECTED Final   Parainfluenza Virus 2 NOT DETECTED NOT DETECTED Final   Parainfluenza Virus 3 NOT DETECTED NOT DETECTED Final   Parainfluenza Virus 4 NOT DETECTED NOT DETECTED Final   Respiratory Syncytial Virus NOT DETECTED NOT DETECTED Final   Bordetella pertussis NOT DETECTED NOT DETECTED Final   Chlamydophila pneumoniae NOT DETECTED NOT DETECTED Final   Mycoplasma pneumoniae NOT DETECTED NOT DETECTED Final    Comment: Performed at [facility], [place] [address], [place], [place] [address]    Coagulation Studies: No results for input(s): LABPROT, INR in the last 72 hours.  Urinalysis: No results for input(s): COLORURINE, LABSPEC, PHURINE, GLUCOSEU, HGBUR, BILIRUBINUR, KETONESUR, PROTEINUR, UROBILINOGEN, NITRITE, LEUKOCYTESUR in the last 72 hours.  Invalid input(s): APPERANCEUR    Imaging: Ct Abdomen Pelvis  Wo Contrast  Result Date: 10/27/2018 CLINICAL DATA:  n /v, pt denies abd pain history of chronic kidney disease. EXAM: CT ABDOMEN AND PELVIS WITHOUT CONTRAST TECHNIQUE: Multidetector CT imaging of the abdomen and pelvis was performed following the standard protocol without IV contrast. COMPARISON:  09/25/2008 FINDINGS: Lower chest: There is a RIGHT pleural effusion.  Ground-glass opacities are identified in the central perihilar regions bilaterally, raising the question of pulmonary edema. There is dense coronary artery calcification. Heart size is normal. Hepatobiliary: Status post cholecystectomy. Liver is homogeneous without focal mass appear Pancreas: Unremarkable. No pancreatic ductal dilatation or surrounding inflammatory changes. Spleen: Normal in size without focal abnormality. Adrenals/Urinary Tract: There is prominence of the adrenal glands bilaterally, not associated with discrete mass. The kidneys are small bilaterally. There is atherosclerotic calcification of the renal arteries. A low-attenuation lesion is identified in the LOWER pole the LEFT kidney, too small to fully characterize. There is no hydronephrosis. Ureters are unremarkable. The bladder and visualized portion of the urethra are normal. Stomach/Bowel: The stomach and small bowel loops are normal in appearance. The appendix is well seen and has a normal appearance. There are scattered colonic diverticula but no acute diverticulitis. Significant stool burden. Vascular/Lymphatic: There is dense atherosclerotic calcification of the abdominal aorta. No retroperitoneal or mesenteric adenopathy. Reproductive: The prostate is enlarged, up lifting the urinary bladder. Other: No free pelvic fluid. Anterior abdominal wall is notable for a small fat containing paraumbilical hernia. There is mild diffuse body wall edema. Musculoskeletal: There is a new 2.1 centimeter lytic lesion within the MEDIAL aspect of the femoral head, adjacent to the fovea. This  lesion does not abut a weight-bearing articular surface and there are no significant degenerative changes in the hip. There is a 1.5 centimeter lytic lesion with than the superior aspect of L3. Lesions at the disc space of L3-4 and L4-5 are favored to represent Schmorl's nodes and are associated with degenerative changes. IMPRESSION: 1. RIGHT pleural effusion and ground-glass opacities in the lung bases consistent with pulmonary edema. 2. There are new osseous lytic lesions, 1 involving the RIGHT femoral head and a second involving the superior aspect of L3 vertebral body. These are suspicious for metastatic disease given their appearance. Consider bone scan for further evaluation. 3. Small kidneys, consistent with the history of chronic renal disease. 4. Colonic diverticulosis without acute diverticulitis. Significant stool burden. 5. Cholecystectomy. 6.  Aortic atherosclerosis.  (ICD10-I70.0) 7. Prostatic enlargement. 8. Small fat containing paraumbilical hernia. 9. Mild anasarca. Electronically Signed   By: Nolon Nations M.D.   On: 10/27/2018 12:38   Dg Chest 2 View  Result Date: 10/28/2018 CLINICAL DATA:  Shortness of breath and cough EXAM: CHEST - 2 VIEW COMPARISON:  October 22, 2018 chest radiograph and chest CT October 23, 2018 FINDINGS: There has been resolution of interstitial edema. The pleural effusion on the right seen on recent CT is not convincingly seen on this study. There is currently no appreciable edema or consolidation. There is mild cardiomegaly with pulmonary venous hypertension. There is aortic atherosclerosis. No adenopathy. No bone lesions. IMPRESSION: Pulmonary vascular congestion without frank edema or consolidation. There is aortic atherosclerosis. Aortic Atherosclerosis (ICD10-I70.0). Electronically Signed   By: Lowella Grip III M.D.   On: 10/28/2018 09:16   Nm Bone Scan Whole Body  Result Date: 10/28/2018 CLINICAL DATA:  New L3 and right femoral head lytic lesions. EXAM:  NUCLEAR MEDICINE WHOLE BODY BONE SCAN TECHNIQUE: Whole body anterior and posterior images were obtained approximately 3 hours after intravenous injection of radiopharmaceutical. RADIOPHARMACEUTICALS:  20.3 mCi Technetium-32m MDP IV COMPARISON:  CT abdomen pelvis dated October 27, 2018. FINDINGS: There are no foci of increased or decreased radiotracer uptake to suggest osseous metastatic disease. There is degenerative type uptake in the bilateral shoulders. Normal physiologic activity is identified within the kidneys and urinary bladder. IMPRESSION:  Negative study. Specifically, no abnormal uptake in the lumbar spine or right femoral head. Electronically Signed   By: Titus Dubin M.D.   On: 10/28/2018 15:06     Medications:   . sodium chloride 250 mL (10/22/18 1033)  . sodium chloride    . sodium chloride    . cefTRIAXone (ROCEPHIN)  IV Stopped (10/28/18 2112)   . aspirin EC  325 mg Oral Daily  . azithromycin  500 mg Oral Q supper  . calcium acetate  2,668 mg Oral TID WC  . Chlorhexidine Gluconate Cloth  6 each Topical Q0600  . Chlorhexidine Gluconate Cloth  6 each Topical Q0600  . collagenase   Topical Daily  . metoprolol tartrate  50 mg Oral BID  . saccharomyces boulardii  250 mg Oral BID   sodium chloride, sodium chloride, sodium chloride, acetaminophen, alteplase, heparin, hydrOXYzine, ipratropium-albuterol, lidocaine (PF), lidocaine-prilocaine, loperamide, ondansetron (ZOFRAN) IV, pentafluoroprop-tetrafluoroeth  Assessment/ Plan:  1. AMS - BC's no growth for 3 days. Lactic acid improved to 1.5. Ammonia elevated, 75. Negative CT head and CXR. Abdominal USunrevealing. On ABX. Per primary. CT abdomen right pleural effusion creatinine loss opacities central perihilar regions consistent with pulmonary edema dense coronary artery calcification.  Hepatobiliary shows status post cholecystectomy liver homogenous without focal masses pancreas shows no ductal dilatation is unremarkable.   Stomach and small bowel loops are normal prominent bilateral adrenal glands with no evidence of masses.  Small bilateral kidneys.  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  Negative study on radionucleotide bone scan with no abnormal uptake in lumbar spine or right femoral head.  PSA is active and in process 2. Acute hypoxic respiratory failure- on 2 L nasal cannula.  Ultrafiltered 1.4 L 10/29/2018.  Patient started on azithromycin and ceftriaxone day 7 we will check room air sats. 3. ESRD - On HD MWF. Tolerated HD well on 10/29/2018 with some needle dislodgment but very little change in hemoglobin.  Will need to follow as an in center.   4. Hypertension/volume - BP improved.  Continue to challenge dry weight.  Noted removal of 1.4 L 10/29/2018 5. Access some bleeding noted from access.  After needle dislodgment we will continue to follow hemoglobin.  This can be done as an outpatient 6. Anemia of CKD -hemoglobin decreased to 9.4 7. Secondary Hyperparathyroidism - Ca and phos at goal. Continue VDRA and binders. 8. Nutrition - Renal diet with fluid restrictions. Nepro.  9. DMT2 - per primary 10. Diastolic dysfunction/aortic stenosis 11. OSA 12. A fib - not on anticoagulation 13. Hx GIB 2/2 coumadin    LOS: 8 Sherril Croon @TODAY @10 :46 AM

## 2018-10-29 NOTE — Progress Notes (Signed)
Informed social work of discharge orders

## 2018-10-29 NOTE — Discharge Summary (Signed)
[facility]  Physician Discharge Summary   [name] ID: RAIHAN KIMMEL MRN: [id number] DOB/AGE: [birth year] 77 y.o.  Admit date: 10/20/2018 Discharge date: 10/29/2018  PCP: [name], No Pcp Per  DISCHARGE DIAGNOSES:  Acute respiratory failure with hypoxia due to volume overload versus pneumonia, resolved Community-acquired pneumonia, improved Sepsis, resolved Acute metabolic encephalopathy, resolved End-stage renal disease on hemodialysis Paroxysmal atrial fibrillation  RECOMMENDATIONS FOR OUTPATIENT FOLLOW UP: 1. [name] will continue with his usual hemodialysis schedule 2. Nephrology will check his blood work next dialysis 3. Will recommend he follow-up with his primary care provider for abnormalities noted on his imaging studies 4. PSA is pending 5. Follow-up with his PCP within 1 week   DISCHARGE CONDITION: fair  Diet recommendation: Heart healthy  Filed Weights   10/28/18 0430 10/29/18 0013 10/29/18 0315  Weight: 93.4 kg 82.7 kg 81.5 kg    INITIAL HISTORY: 77 y.o.malewith medical history significant ofend-stage renal disease on hemodialysis Monday Wednesday Friday, A. fib, CHF, diabetes,hypertension, hyperlipidemia,OSA presented to the hospital from his nursing home for further evaluation of altered mental status. He presented with hypotension and AMS concerning for sepsis. He was started on broad spectrum antibiotics. He was noted to have an oxygen requirement and had a CT concerning for edema vs inflammation or atypical infection. He was started on coverage for CAP. The CT was also notable for a moderate R sided effusion. IR was consulted, but there was not enough to tap. He was also noted to have an elevated ammonia, but he's had intermittent diarrhea prior to presentation and worsening diarrhea since lactlose was started.   Consultants: Nephrology.  Procedures: Hemodialysis   HOSPITAL COURSE:   Acute hypoxic respiratory failure [name] was  noted to be hypoxic requiring oxygen.  His imaging studies raise concern for pulmonary edema versus atypical infection.  He was initially started on broad-spectrum antibiotics.  He was dialyzed.  His respiratory status has significantly improved.  He saturating normal on room air now.  Repeat imaging studies showed improvement in interstitial edema.    Abnormal appearing lesions in the right femoral head and L3 vertebral body  CT scan of the abdomen pelvis raised concern for lytic lesions in the right femoral head as well as L3 vertebral body.  [name] without any known history of cancer.  These findings were discussed with the [name].  A nuclear bone scan was done which did not show any bony lesions.  CT scan could have been a false positive.   PSA was ordered and is pending.  Outpatient evaluation is recommended.    Sepsis with elevated lactic acid level and hypotension Likely secondary to pneumonia.  Cultures negative.  WBC has been normal.  Will complete 7 days of antibiotics today.  Sepsis physiology has resolved.  Acute metabolic encephalopathy Alteration in mental status most likely due to metabolic derangements as well as hypoxia.  Mental status now back to baseline.  There was also some concern for hyperammonemia.  He was given lactulose resulting in explosive diarrhea.  Normal B12 level noted.  LFTs have been normal.  No further work-up necessary at this time.    Elevated ammonia level Etiology unclear.  There was some concern for liver cirrhosis due to thrombocytopenia low albumin and elevated INR.  Lactulose currently on hold due to diarrhea.  CT scan of the abdomen did not show any abnormalities in the liver.  Nausea and vomiting The symptoms have resolved.    Diarrhea Most likely due to lactulose.  Appears to have  improved.  CT scan did shows significant stool burden.  Is quite likely [name] had been constipated for a while.  We will not give him any more Imodium.  Allow him  to have normal bowel movements.    TSH normal.  Elevated alkaline phosphatase Right upper quadrant ultrasound was unremarkable.  No abnormalities noted in the hepatobiliary system on Ct scan.  He is status post cholecystectomy.  Outpatient monitoring.  Right-sided pleural effusion Thoracentesis was ordered however no significant fluid was noted when he went down for the procedure.  No significant effusion noted on chest x-ray done on 11/1.  End-stage renal disease on hemodialysis on Monday Wednesday and Friday Management per nephrology.    He underwent dialysis yesterday.  Did have some bleeding from his dialysis site.  Controlled easily.  No bleeding today.  Hemoglobin is slightly low from his baseline.  This will be rechecked by nephrology at next dialysis.  Paroxysmal atrial fibrillation Mild RVR noted during this hospitalization.  Metoprolol dose aws increased to 100 mg twice daily. Not noted to be on anticoagulation.  As per cardiology note from 2018 he is not on warfarin due to history of GI bleed. Dose of metoprolol was reduced due to borderline low blood pressures.  Heart rate remains well controlled.  Chronic diastolic CHF Stable.  Volume being managed with hemodialysis.  Diabetes mellitus type 2 HbA1c 4.2.  Recommend stopping his Prandin.  Essential hypertension Blood pressure stable slightly on the lower side.  He is asymptomatic.  Obstructive sleep apnea Continue CPAP  Anemia of chronic disease Stable.  Thrombocytopenia Stable.  No evidence of overt bleeding.  Overall stable.  Okay for discharge back to his assisted living facility if they can provide higher level of care that is recommended by physical therapy.  Home health will be ordered.     PERTINENT LABS:  The results of significant diagnostics from this hospitalization (including imaging, microbiology, ancillary and laboratory) are listed below for reference.    Microbiology: Recent Results  (from the past 240 hour(s))  Blood Culture (routine x 2)     Status: None   Collection Time: 10/20/18  5:10 PM  Result Value Ref Range Status   Specimen Description BLOOD RIGHT WRIST  Final   Special Requests   Final    BOTTLES DRAWN AEROBIC AND ANAEROBIC Blood Culture adequate volume   Culture   Final    NO GROWTH 5 DAYS Performed at [facility], [address], [place], [place] [address]    Report Status 10/25/2018 FINAL  Final  Blood Culture (routine x 2)     Status: None   Collection Time: 10/20/18  5:10 PM  Result Value Ref Range Status   Specimen Description BLOOD RIGHT HAND  Final   Special Requests   Final    BOTTLES DRAWN AEROBIC AND ANAEROBIC Blood Culture results may not be optimal due to an inadequate volume of blood received in culture bottles   Culture   Final    NO GROWTH 5 DAYS Performed at [facility], [place] [address] [place], [place] [address]    Report Status 10/25/2018 FINAL  Final  MRSA PCR Screening     Status: None   Collection Time: 10/21/18  3:49 AM  Result Value Ref Range Status   MRSA by PCR NEGATIVE NEGATIVE Final    Comment:        The GeneXpert MRSA Assay (FDA approved for NASAL specimens only), is one component of a  comprehensive MRSA colonization surveillance program. It is not intended to diagnose MRSA infection nor to guide or monitor treatment for MRSA infections. Performed at [facility], [place] [address] [place], [place] [address]   Respiratory Panel by PCR     Status: None   Collection Time: 10/22/18  5:03 PM  Result Value Ref Range Status   Adenovirus NOT DETECTED NOT DETECTED Final   Coronavirus 229E NOT DETECTED NOT DETECTED Final   Coronavirus HKU1 NOT DETECTED NOT DETECTED Final   Coronavirus NL63 NOT DETECTED NOT DETECTED Final   Coronavirus OC43 NOT DETECTED NOT DETECTED Final   Metapneumovirus NOT DETECTED NOT DETECTED Final   Rhinovirus / Enterovirus NOT DETECTED NOT DETECTED Final   Influenza A NOT  DETECTED NOT DETECTED Final   Influenza B NOT DETECTED NOT DETECTED Final   Parainfluenza Virus 1 NOT DETECTED NOT DETECTED Final   Parainfluenza Virus 2 NOT DETECTED NOT DETECTED Final   Parainfluenza Virus 3 NOT DETECTED NOT DETECTED Final   Parainfluenza Virus 4 NOT DETECTED NOT DETECTED Final   Respiratory Syncytial Virus NOT DETECTED NOT DETECTED Final   Bordetella pertussis NOT DETECTED NOT DETECTED Final   Chlamydophila pneumoniae NOT DETECTED NOT DETECTED Final   Mycoplasma pneumoniae NOT DETECTED NOT DETECTED Final    Comment: Performed at [facility], [place] [address], [place], [place] [address]     Labs: Basic Metabolic Panel: Recent Labs  Lab 10/23/18 1638 10/24/18 4665 10/25/18 0759 10/25/18 1448 10/26/18 0538 10/26/18 1954 10/28/18 0542 10/29/18 0106  NA 142 142  --  143 143 138 136 134*  K 4.7 5.8*  --  4.1 4.2 5.1 3.9 4.7  CL 103 103  --  102 102 97* 100 98  CO2 24 23  --  24 26 29 28 27   GLUCOSE 89 113*  --  86 90 134* 83 115*  BUN 43* 66*  --  33* 48* 57* 24* 41*  CREATININE 6.10* 7.80*  --  5.58* 6.97* 7.79* 4.77* 6.05*  CALCIUM 9.8 9.8  --  9.1 9.2 9.1 8.6* 9.1  MG  --  2.5* 2.2  --  2.3  --   --   --   PHOS 4.2  --   --   --   --  5.5*  --  4.5   Liver Function Tests: Recent Labs  Lab 10/24/18 0614 10/25/18 0759 10/26/18 0538 10/26/18 1954 10/29/18 0106  AST 30 32 24  --   --   ALT 26 26 22   --   --   ALKPHOS 206* 182* 170*  --   --   BILITOT 1.7* 1.2 1.1  --   --   PROT 6.9 7.0 6.8  --   --   ALBUMIN 3.1* 3.1* 3.0* 2.8* 2.9*   Recent Labs  Lab 10/24/18 0614  LIPASE 32   Recent Labs  Lab 10/25/18 0759 10/26/18 0538  AMMONIA 32 42*   CBC: Recent Labs  Lab 10/26/18 0538 10/26/18 1954 10/28/18 0542 10/29/18 0106 10/29/18 0431  WBC 8.5 9.9 7.1 8.6 7.8  HGB 10.3* 10.0* 9.8* 10.4* 9.4*  HCT 33.4* 33.4* 32.0* 32.7* 31.5*  MCV 106.0* 106.7* 104.2* 104.1* 105.0*  PLT 121* 131* 116* 126* 125*   CBG: Recent Labs  Lab  10/28/18 1157 10/28/18 1703 10/28/18 2101 10/29/18 0755 10/29/18 1138  GLUCAP 114* 119* 169* 117* 117*     IMAGING STUDIES Ct Abdomen Pelvis Wo Contrast  Result Date: 10/27/2018 CLINICAL DATA:  n /v, pt denies abd pain history of chronic kidney disease. EXAM: CT ABDOMEN AND PELVIS WITHOUT CONTRAST TECHNIQUE: Multidetector CT imaging of the abdomen and pelvis was performed following the standard protocol without IV contrast. COMPARISON:  09/25/2008 FINDINGS: Lower chest: There is a RIGHT pleural effusion. Ground-glass opacities are identified in the central perihilar regions bilaterally, raising the question of pulmonary edema. There is dense coronary artery calcification. Heart size is normal. Hepatobiliary: Status post cholecystectomy. Liver is homogeneous without focal mass appear Pancreas: Unremarkable. No pancreatic ductal dilatation or surrounding inflammatory changes. Spleen: Normal in size without focal abnormality. Adrenals/Urinary Tract: There is prominence of the adrenal glands bilaterally, not associated with discrete mass. The kidneys are small bilaterally. There is atherosclerotic calcification of the renal arteries. A low-attenuation lesion is identified in the LOWER pole the LEFT kidney, too small to fully characterize. There is no hydronephrosis. Ureters are unremarkable. The bladder and visualized portion of the urethra are normal. Stomach/Bowel: The stomach and small bowel loops are normal in appearance. The appendix is well seen and has a normal appearance. There are scattered colonic diverticula but no acute diverticulitis. Significant stool burden. Vascular/Lymphatic: There is dense atherosclerotic calcification of the abdominal aorta. No retroperitoneal or mesenteric adenopathy. Reproductive: The prostate is enlarged, up lifting the urinary bladder. Other: No free pelvic fluid. Anterior abdominal wall is notable for a small fat containing paraumbilical hernia. There is mild  diffuse body wall edema. Musculoskeletal: There is a new 2.1 centimeter lytic lesion within the MEDIAL aspect of the femoral head, adjacent to the fovea. This lesion does not abut a weight-bearing articular surface and there are no significant degenerative changes in the hip. There is a 1.5 centimeter lytic lesion with than the superior aspect of L3. Lesions at the disc space of L3-4 and L4-5 are favored to represent Schmorl's nodes and are associated with degenerative changes. IMPRESSION: 1. RIGHT pleural effusion and ground-glass opacities in the lung bases consistent with pulmonary edema. 2. There are new osseous lytic lesions, 1 involving the RIGHT femoral head and a second involving the superior aspect of L3 vertebral body. These are suspicious for metastatic disease given their appearance. Consider bone scan for further evaluation. 3. Small kidneys, consistent with the history of chronic renal disease. 4. Colonic diverticulosis without acute diverticulitis. Significant stool burden. 5. Cholecystectomy. 6.  Aortic atherosclerosis.  (ICD10-I70.0) 7. Prostatic enlargement. 8. Small fat containing paraumbilical hernia. 9. Mild anasarca. Electronically Signed   By: Nolon Nations M.D.   On: 10/27/2018 12:38   Dg Chest 1 View  Result Date: 10/22/2018 CLINICAL DATA:  77 year old male admitted with altered mental status, hypotension. In stage renal disease on dialysis. EXAM: CHEST  1 VIEW COMPARISON:  10/20/2018 and earlier. FINDINGS: AP view at 0734 hours. Mildly lower lung volumes. Stable cardiac size and mediastinal contours. Calcified aortic atherosclerosis. Visualized tracheal air column is within normal limits. Continued mild diffuse increased interstitial opacity. No pneumothorax, pleural effusion or confluent opacity. Negative visible bowel gas. IMPRESSION: Continued mild nonspecific bilateral pulmonary interstitial opacity, might reflect crowding of lung markings. Pulmonary interstitial edema and  viral/atypical respiratory infection are difficult to exclude. Electronically Signed   By: Genevie Ann M.D.   On: 10/22/2018 09:28   Dg Chest 2 View  Result Date: 10/28/2018 CLINICAL DATA:  Shortness of breath and cough EXAM: CHEST - 2 VIEW COMPARISON:  October 22, 2018 chest radiograph and chest CT October 23, 2018 FINDINGS: There has been resolution of interstitial edema. The pleural effusion on  the right seen on recent CT is not convincingly seen on this study. There is currently no appreciable edema or consolidation. There is mild cardiomegaly with pulmonary venous hypertension. There is aortic atherosclerosis. No adenopathy. No bone lesions. IMPRESSION: Pulmonary vascular congestion without frank edema or consolidation. There is aortic atherosclerosis. Aortic Atherosclerosis (ICD10-I70.0). Electronically Signed   By: Lowella Grip III M.D.   On: 10/28/2018 09:16   Dg Chest 2 View  Result Date: 10/13/2018 CLINICAL DATA:  Fall. EXAM: CHEST - 2 VIEW COMPARISON:  June 16, 2012 FINDINGS: Mild cardiomegaly. The hila and mediastinum are unchanged since 2013. No nodules or masses. No focal infiltrates. Mild pulmonary venous congestion not excluded. No overt edema. IMPRESSION: Cardiomegaly. Possible mild pulmonary venous congestion without overt edema. Recommend clinical correlation. Electronically Signed   By: Dorise Bullion III M.D   On: 10/13/2018 08:02   Ct Head Wo Contrast  Result Date: 10/20/2018 CLINICAL DATA:  Altered LOC EXAM: CT HEAD WITHOUT CONTRAST TECHNIQUE: Contiguous axial images were obtained from the base of the skull through the vertex without intravenous contrast. COMPARISON:  10/13/2018 FINDINGS: Brain: No acute hemorrhage or intracranial mass. 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 Old lacunar infarct left basal ganglia. Moderate-to-marked atrophy. Moderate small vessel ischemic changes of the white matter. Stable enlarged ventricles. Vascular: No hyperdense vessels.  Carotid  vascular calcification Skull: Normal. Negative for fracture or focal lesion. Sinuses/Orbits: Mild mucosal thickening in the sinuses. No acute orbital abnormality. Other: None IMPRESSION: 1. No CT evidence for acute intracranial abnormality. 2. Atrophy and small-vessel ischemic changes of the white matter. 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 Electronically Signed   By: Donavan Foil M.D.   On: 10/20/2018 18:49   Ct Head Wo Contrast  Result Date: 10/13/2018 CLINICAL DATA:  Fall.  Altered mental status. EXAM: CT HEAD WITHOUT CONTRAST TECHNIQUE: Contiguous axial images were obtained from the base of the skull through the vertex without intravenous contrast. COMPARISON:  01/02/2009 FINDINGS: Brain: Left posterior parietal infarct, age indeterminate. There is atrophy and chronic small vessel disease changes. No hydrocephalus or hemorrhage. Vascular: No hyperdense vessel or unexpected calcification. Skull: No acute calvarial abnormality. Sinuses/Orbits: Mucosal thickening in the paranasal sinuses. No air-fluid levels. Mastoids clear. Other: None IMPRESSION: Age-indeterminate left posterior parietal infarct. Atrophy, chronic small vessel disease. Electronically Signed   By: Rolm Baptise M.D.   On: 10/13/2018 08:17   Ct Chest Wo Contrast  Result Date: 10/23/2018 CLINICAL DATA:  Acute respiratory illness. EXAM: CT CHEST WITHOUT CONTRAST TECHNIQUE: Multidetector CT imaging of the chest was performed following the standard protocol without IV contrast. COMPARISON:  Chest radiograph, 10/22/2018 and older exams. FINDINGS: Cardiovascular: Heart is normal in size. There are dense three-vessel coronary artery calcifications. No pericardial effusion. Great vessels are normal in caliber. Aortic atherosclerosis. Mediastinum/Nodes: No neck base or axillary masses or enlarged lymph nodes. Scattered prominent mediastinal lymph nodes, none pathologically enlarged. No mediastinal or hilar masses. Trachea is patent. Esophagus  is unremarkable. Lungs/Pleura: Moderate right pleural effusion. No left pleural effusion. Lungs show upper lobe predominant interstitial thickening with intervening ground-glass opacities. No lung mass or suspicious nodule. Several small calcified granuloma. No pneumothorax. Upper Abdomen: No acute finding. Musculoskeletal: No fracture or acute finding. No osteoblastic or osteolytic lesions. Chest wall: Prominent bilateral gynecomastia. IMPRESSION: 1. Lungs demonstrate upper lobe predominant interstitial thickening with intervening ground-glass opacities. Differential diagnosis includes pulmonary edema as well as inflammation or atypical infection. 2. Moderate right pleural effusion. 3. Dense coronary artery calcifications.  Aortic atherosclerosis. Aortic Atherosclerosis (  ICD10-I70.0). Electronically Signed   By: Lajean Manes M.D.   On: 10/23/2018 17:23   Nm Bone Scan Whole Body  Result Date: 10/28/2018 CLINICAL DATA:  New L3 and right femoral head lytic lesions. EXAM: NUCLEAR MEDICINE WHOLE BODY BONE SCAN TECHNIQUE: Whole body anterior and posterior images were obtained approximately 3 hours after intravenous injection of radiopharmaceutical. RADIOPHARMACEUTICALS:  20.3 mCi Technetium-53m MDP IV COMPARISON:  CT abdomen pelvis dated October 27, 2018. FINDINGS: There are no foci of increased or decreased radiotracer uptake to suggest osseous metastatic disease. There is degenerative type uptake in the bilateral shoulders. Normal physiologic activity is identified within the kidneys and urinary bladder. IMPRESSION: Negative study. Specifically, no abnormal uptake in the lumbar spine or right femoral head. Electronically Signed   By: Titus Dubin M.D.   On: 10/28/2018 15:06   US Abdomen Complete  Result Date: 10/21/2018 CLINICAL DATA:  Abdominal pain EXAM: ABDOMEN ULTRASOUND COMPLETE COMPARISON:  None. FINDINGS: Gallbladder: Surgically absent Common bile duct: Diameter: 7 mm-upper normal for age. No  evident filling defect Liver: No focal lesion identified. Within normal limits in parenchymal echogenicity. Portal vein is patent on color Doppler imaging with normal direction of blood flow towards the liver. IVC: No abnormality visualized. Pancreas: Visualized portion unremarkable. Spleen: Size and appearance within normal limits. Right Kidney: Known end-stage renal disease. Length: 8 cm. Increased echogenicity. No hydronephrosis or mass Left Kidney: Length: 7 cm. There is increased echogenicity. No hydronephrosis or mass. Abdominal aorta: No aneurysm visualized. The distal aorta is not visible due to bowel gas. Other findings: A right pleural effusion is partially visualized. IMPRESSION: 1. No acute intra-abdominal finding. 2. Right pleural effusion. Electronically Signed   By: Monte Fantasia M.D.   On: 10/21/2018 18:15   Dg Chest Port 1 View  Result Date: 10/20/2018 CLINICAL DATA:  Altered mental status and hypotension. EXAM: PORTABLE CHEST 1 VIEW COMPARISON:  10/13/2018 and prior radiographs FINDINGS: UPPER limits normal heart size noted. Mild interstitial prominence again noted. No airspace disease, pleural effusion or pneumothorax. No acute bony abnormality identified. IMPRESSION: UPPER limits normal heart size with unchanged interstitial prominence. Electronically Signed   By: Margarette Canada M.D.   On: 10/20/2018 16:42   Dg Toe 5th Left  Result Date: 10/24/2018 CLINICAL DATA:  Open wound on toe.  Diabetic. EXAM: DG TOE 5TH LEFT COMPARISON:  None. FINDINGS: Mild irregularity of the base of the fifth proximal phalanx. No bone destruction to suggest osteomyelitis. No definite fracture identified. No air within the soft tissues. IMPRESSION: No evidence of fracture or osteomyelitis. Electronically Signed   By: Marin Olp M.D.   On: 10/24/2018 19:25   Ir US Chest  Result Date: 10/26/2018 CLINICAL DATA:  77 year old male with a history of possible pleural fluid. EXAM: CHEST ULTRASOUND COMPARISON:   None. FINDINGS: Sonographic survey of the chest demonstrates no significant fluid. Thoracentesis not performed. IMPRESSION: Sonographic survey demonstrates no significant right pleural fluid. Electronically Signed   By: Corrie Mckusick D.O.   On: 10/26/2018 11:14    DISCHARGE EXAMINATION: Vitals:   10/29/18 0200 10/29/18 0315 10/29/18 0937 10/29/18 1212  BP: (!) 101/55 (!) 111/59 103/60 (!) 114/51  Pulse: 84 91 75 78  Resp: 13 11  18   Temp:  97.9 F (36.6 C) 97.6 F (36.4 C) 98.4 F (36.9 C)  TempSrc:  Oral Oral Oral  SpO2: 95% 94% 94% 100%  Weight:  81.5 kg    Height:       General appearance: alert, cooperative,  appears stated age and no distress Resp: Normal effort at rest.  Improved aeration bilaterally.  Crackles at the bases. Cardio: regular rate and rhythm, S1, S2 normal, no murmur, click, rub or gallop GI: soft, non-tender; bowel sounds normal; no masses,  no organomegaly  DISPOSITION: Hopefully back to his assisted living facility  Discharge Instructions    Call MD for:  difficulty breathing, headache or visual disturbances   Complete by:  As directed    Call MD for:  extreme fatigue   Complete by:  As directed    Call MD for:  persistant dizziness or light-headedness   Complete by:  As directed    Call MD for:  persistant nausea and vomiting   Complete by:  As directed    Call MD for:  severe uncontrolled pain   Complete by:  As directed    Call MD for:  temperature >100.4   Complete by:  As directed    Diet - low sodium heart healthy   Complete by:  As directed    Discharge instructions   Complete by:  As directed    Please review instructions on the discharge summary.  You were cared for by a hospitalist during your hospital stay. If you have any questions about your discharge medications or the care you received while you were in the hospital after you are discharged, you can call the unit and asked to speak with the hospitalist on call if the hospitalist that  took care of you is not available. Once you are discharged, your primary care physician will handle any further medical issues. Please note that NO REFILLS for any discharge medications will be authorized once you are discharged, as it is imperative that you return to your primary care physician (or establish a relationship with a primary care physician if you do not have one) for your aftercare needs so that they can reassess your need for medications and monitor your lab values. If you do not have a primary care physician, you can call [phone] for a physician referral.   Increase activity slowly   Complete by:  As directed          Allergies as of 10/29/2018      Reactions   Tape Itching, Other (See Comments)   Cloth tape only      Medication List    STOP taking these medications   amLODipine 5 MG tablet Commonly known as:  NORVASC   atorvastatin 40 MG tablet Commonly known as:  LIPITOR   losartan 100 MG tablet Commonly known as:  COZAAR   metoprolol succinate 100 MG 24 hr tablet Commonly known as:  TOPROL-XL   metoprolol succinate 50 MG 24 hr tablet Commonly known as:  TOPROL-XL   repaglinide 0.5 MG tablet Commonly known as:  PRANDIN     TAKE these medications   acetaminophen 500 MG tablet Commonly known as:  TYLENOL Take 1,000 mg by mouth 3 (three) times daily.   aspirin EC 325 MG tablet Take 1 tablet (325 mg total) by mouth daily.   calcium acetate 667 MG capsule Commonly known as:  PHOSLO Take 2,668 mg by mouth 3 (three) times daily with meals.   hydrOXYzine 25 MG tablet Commonly known as:  ATARAX/VISTARIL Take 25 mg by mouth every 8 (eight) hours as needed for itching.   metoprolol tartrate 50 MG tablet Commonly known as:  LOPRESSOR Take 1 tablet (50 mg total) by mouth 2 (two) times daily.  TOTAL DISCHARGE TIME: 35 mins  Bonnielee Haff  [facility] Pager [phone]  10/29/2018, 12:53 PM

## 2018-10-29 NOTE — Progress Notes (Signed)
Pt has left for dialysis

## 2018-10-29 NOTE — Evaluation (Addendum)
Occupational Therapy Evaluation Patient Details Name: [name] MRN: [id number] DOB: July 18, 1941 Today's Date: 10/29/2018    History of Present Illness 77yo male sent to the ED from his SNF due to AMS and BP 60/40. Head CT negative for acute changes. Diagnosed with sepsis. PMH A-fib, CHF, CKD, DM, HTN, on HD x3/week    Clinical Impression   Pt admitted with above and demonstrates the below listed deficits.  He currently requires min A for ADLs and functional mobility due to occasional LOB.   PTA, he lived at [place], and reports he was mod I with ADLs.  Recommend continued OT at next venue of care.  If pt able to return to ALF at current level of functioning, recommend HHOT, otherwise will require SNF.  All further OT needs can be met in next venue of care.  Acute OT will sign off.     Follow Up Recommendations  SNF;Home health OT - dependent if pt able to return to ALF     Equipment Recommendations       Recommendations for Other Services       Precautions / Restrictions Precautions Precautions: Fall;Other (comment) Precaution Comments: chronic wounds on buttocks and feet       Mobility Bed Mobility Overal bed mobility: Needs Assistance Bed Mobility: Supine to Sit;Sit to Supine Rolling: Supervision Sidelying to sit: Supervision       General bed mobility comments: supervision for safety   Transfers Overall transfer level: Needs assistance Equipment used: Rolling walker (2 wheeled);None Transfers: Sit to/from [employer] to Stand: Min guard Stand pivot transfers: Min guard       General transfer comment: min guard for safety     Balance Overall balance assessment: Needs assistance Sitting-balance support: Feet supported;No upper extremity supported Sitting balance-Leahy Scale: Good Sitting balance - Comments: able to don/doff socks EOB without LOB    Standing balance support: Single extremity supported;During functional activity Standing  balance-Leahy Scale: Poor Standing balance comment: requires UE support                            ADL either performed or assessed with clinical judgement   ADL Overall ADL's : Needs assistance/impaired Eating/Feeding: Independent;Bed level   Grooming: Wash/dry hands;Wash/dry face;Oral care;Brushing hair;Min guard;Standing   Upper Body Bathing: Set up;Supervision/ safety;Sitting   Lower Body Bathing: Minimal assistance;Sit to/from stand   Upper Body Dressing : Supervision/safety;Set up;Sitting   Lower Body Dressing: Minimal assistance;Sit to/from stand   Toilet Transfer: Minimal assistance;Ambulation;Comfort height toilet;Grab bars;RW   Toileting- Clothing Manipulation and Hygiene: Minimal assistance;Sit to/from stand       Functional mobility during ADLs: Minimal assistance;Rolling walker General ADL Comments: requires min A for occasional LOB      Vision Patient Visual Report: Blurring of vision Additional Comments: pt reports decreased vision for the past several months.  He reports he is scheduled to see ophthalmologist      Perception     Praxis      Pertinent Vitals/Pain Pain Assessment: No/denies pain     Hand Dominance     Extremity/Trunk Assessment Upper Extremity Assessment Upper Extremity Assessment: Generalized weakness   Lower Extremity Assessment Lower Extremity Assessment: Defer to PT evaluation   Cervical / Trunk Assessment Cervical / Trunk Assessment: Normal   Communication Communication Communication: No difficulties   Cognition Arousal/Alertness: Awake/alert Behavior During Therapy: Flat affect Overall Cognitive Status: No family/caregiver present to determine baseline cognitive functioning  General Comments: Pt not oriented to situation, but was appropriate, followed multistep commands   General Comments  Pt ambulated ~100' in hallway using RW and min A for occasional LOB      Exercises     Shoulder Instructions      Home Living Family/patient expects to be discharged to:: Assisted living                                 Additional Comments: Per chart, pt lived at [facility] living       Prior Functioning/Environment Level of Independence: Independent with assistive device(s)        Comments: Per pt report        OT Problem List: Decreased strength;Decreased activity tolerance;Impaired balance (sitting and/or standing);Decreased safety awareness;Decreased cognition      OT Treatment/Interventions:      OT Goals(Current goals can be found in the care plan section) Acute Rehab OT Goals Patient Stated Goal: to go home  OT Goal Formulation: All assessment and education complete, DC therapy  OT Frequency:     Barriers to D/C:            Co-evaluation              AM-PAC PT "6 Clicks" Daily Activity     Outcome Measure Help from another person eating meals?: None Help from another person taking care of personal grooming?: A Little Help from another person toileting, which includes using toliet, bedpan, or urinal?: A Little Help from another person bathing (including washing, rinsing, drying)?: A Little Help from another person to put on and taking off regular upper body clothing?: None Help from another person to put on and taking off regular lower body clothing?: A Little 6 Click Score: 20   End of Session Equipment Utilized During Treatment: Gait belt;Rolling walker Nurse Communication: Mobility status  Activity Tolerance: Patient tolerated treatment well Patient left: in bed;with call bell/phone within reach  OT Visit Diagnosis: Unsteadiness on feet (R26.81)                Time: 7619-5093 OT Time Calculation (min): 21 min Charges:  OT General Charges $OT Visit: 1 Visit OT Evaluation $OT Eval Moderate Complexity: 1 Mod  Lucille Passy, OTR/L Acute Rehabilitation Services Pager [phone] Office  [phone]   Lucille Passy M 10/29/2018, 12:39 PM

## 2018-10-30 ENCOUNTER — Inpatient Hospital Stay (HOSPITAL_COMMUNITY): Payer: Medicare Other

## 2018-10-30 DIAGNOSIS — M899 Disorder of bone, unspecified: Secondary | ICD-10-CM

## 2018-10-30 LAB — PSA, TOTAL AND FREE
PSA, Free Pct: 33.2 %
PSA, Free: 2.19 ng/mL
Prostate Specific Ag, Serum: 6.6 ng/mL — ABNORMAL HIGH (ref 0.0–4.0)

## 2018-10-30 LAB — CBC
HCT: 27.8 % — ABNORMAL LOW (ref 39.0–52.0)
HEMOGLOBIN: 8.7 g/dL — AB (ref 13.0–17.0)
MCH: 32.8 pg (ref 26.0–34.0)
MCHC: 31.3 g/dL (ref 30.0–36.0)
MCV: 104.9 fL — AB (ref 80.0–100.0)
Platelets: 111 10*3/uL — ABNORMAL LOW (ref 150–400)
RBC: 2.65 MIL/uL — AB (ref 4.22–5.81)
RDW: 18.9 % — AB (ref 11.5–15.5)
WBC: 7.3 10*3/uL (ref 4.0–10.5)
nRBC: 0 % (ref 0.0–0.2)

## 2018-10-30 LAB — GLUCOSE, CAPILLARY
GLUCOSE-CAPILLARY: 118 mg/dL — AB (ref 70–99)
GLUCOSE-CAPILLARY: 122 mg/dL — AB (ref 70–99)
Glucose-Capillary: 134 mg/dL — ABNORMAL HIGH (ref 70–99)
Glucose-Capillary: 122 mg/dL — ABNORMAL HIGH (ref 70–99)

## 2018-10-30 MED ORDER — CHLORHEXIDINE GLUCONATE CLOTH 2 % EX PADS
6.0000 | MEDICATED_PAD | Freq: Every day | CUTANEOUS | Status: DC
  Administered 2018-10-30: 6 via TOPICAL

## 2018-10-30 MED ORDER — GERHARDT'S BUTT CREAM
TOPICAL_CREAM | Freq: Four times a day (QID) | CUTANEOUS | Status: DC
  Administered 2018-10-30 – 2018-11-02 (×8): via TOPICAL
  Filled 2018-10-30 (×2): qty 1

## 2018-10-30 MED ORDER — HALOPERIDOL LACTATE 5 MG/ML IJ SOLN
2.0000 mg | Freq: Once | INTRAMUSCULAR | Status: AC | PRN
  Administered 2018-10-31: 2 mg via INTRAVENOUS
  Filled 2018-10-30 (×2): qty 1

## 2018-10-30 MED ORDER — LORAZEPAM 2 MG/ML IJ SOLN
0.5000 mg | Freq: Once | INTRAMUSCULAR | Status: AC | PRN
  Administered 2018-10-31: 0.5 mg via INTRAVENOUS
  Filled 2018-10-30: qty 1

## 2018-10-30 MED ORDER — LORAZEPAM 2 MG/ML IJ SOLN
0.5000 mg | Freq: Once | INTRAMUSCULAR | Status: AC
  Administered 2018-10-30: 0.5 mg via INTRAVENOUS
  Filled 2018-10-30: qty 1

## 2018-10-30 MED ORDER — CALCIUM ACETATE (PHOS BINDER) 667 MG PO CAPS
2668.0000 mg | ORAL_CAPSULE | Freq: Three times a day (TID) | ORAL | Status: DC
  Administered 2018-10-31 – 2018-11-02 (×6): 2668 mg via ORAL
  Filled 2018-10-30 (×7): qty 4

## 2018-10-30 NOTE — Progress Notes (Signed)
0.5mg  Ativan given to patient IV upon Transportation arrival. [place] to MR of lumbar and pelvis.

## 2018-10-30 NOTE — Progress Notes (Signed)
Pt was moving a lot during lumbar spine unable to get good images sent what I had to rad to see if could read what I got. Nurse notified at 1 pm pt to be sent back unable to do hip due to pt turning over and moving all over scanner. Beating on scanner also almost coming off table. Tried to swing at me when bringing patient out of scanner.

## 2018-10-30 NOTE — Progress Notes (Signed)
[name] ROUNDING NOTE   Subjective:   Resting comfortably in bed without complaints.  Blood pressure 109/55 pulse 72 temperature 97.9 O2 sats 95% room air  Sodium 134 potassium 4.7 chloride 98 CO2 27 BUN 41 creatinine 6.05 calcium 6.1 glucose 115 phosphorus 4.5 albumin 2.9 calcium 9.1 WBC 7.8 hemoglobin 9.4 platelets 125 TSH 1.579  MRI of right hip and lumbar spine without contrast ordered by Dr. [place] [name] in order to evaluate lytic lesions also serum protein electrophoresis.  PSA pending although not thought to represent prostate cancer   Objective:  Vital signs in last 24 hours:  Temp:  [97.6 F (36.4 C)-98.4 F (36.9 C)] 97.9 F (36.6 C) (11/03 0245) Pulse Rate:  [72-91] 72 (11/03 0245) Resp:  [18-20] 20 (11/03 0245) BP: (109-117)/(51-62) 109/55 (11/03 0245) SpO2:  [94 %-100 %] 95 % (11/03 0245) Weight:  [71.3 kg] 71.3 kg (11/03 0245)  Weight change: -11.4 kg Filed Weights   10/29/18 0013 10/29/18 0315 10/30/18 0245  Weight: 82.7 kg 81.5 kg 71.3 kg    Intake/Output: I/O last 3 completed shifts: In: 52 [P.O.:770; IV Piggyback:200] Out: 1402 [Other:1400; Stool:2]   Intake/Output this shift:  Total I/O In: 240 [P.O.:240] Out: 0   CVS-tachycardic RS- CTA slightly diminished at base ABD- BS present soft non-distended EXT- no edema left upper extremity AV fistula with thrill and bruit   Basic Metabolic Panel: Recent Labs  Lab 10/24/18 0614 10/25/18 0759 10/25/18 1448 10/26/18 0538 10/26/18 1954 10/28/18 0542 10/29/18 0106  NA 142  --  143 143 138 136 134*  K 5.8*  --  4.1 4.2 5.1 3.9 4.7  CL 103  --  102 102 97* 100 98  CO2 23  --  24 26 29 28 27   GLUCOSE 113*  --  86 90 134* 83 115*  BUN 66*  --  33* 48* 57* 24* 41*  CREATININE 7.80*  --  5.58* 6.97* 7.79* 4.77* 6.05*  CALCIUM 9.8  --  9.1 9.2 9.1 8.6* 9.1  MG 2.5* 2.2  --  2.3  --   --   --   PHOS  --   --   --   --  5.5*  --  4.5    Liver Function Tests: Recent Labs  Lab  10/24/18 0614 10/25/18 0759 10/26/18 0538 10/26/18 1954 10/29/18 0106  AST 30 32 24  --   --   ALT 26 26 22   --   --   ALKPHOS 206* 182* 170*  --   --   BILITOT 1.7* 1.2 1.1  --   --   PROT 6.9 7.0 6.8  --   --   ALBUMIN 3.1* 3.1* 3.0* 2.8* 2.9*   Recent Labs  Lab 10/24/18 0614  LIPASE 32   Recent Labs  Lab 10/25/18 0759 10/26/18 0538  AMMONIA 32 42*    CBC: Recent Labs  Lab 10/26/18 0538 10/26/18 1954 10/28/18 0542 10/29/18 0106 10/29/18 0431  WBC 8.5 9.9 7.1 8.6 7.8  HGB 10.3* 10.0* 9.8* 10.4* 9.4*  HCT 33.4* 33.4* 32.0* 32.7* 31.5*  MCV 106.0* 106.7* 104.2* 104.1* 105.0*  PLT 121* 131* 116* 126* 125*    Cardiac Enzymes: No results for input(s): CKTOTAL, CKMB, CKMBINDEX, TROPONINI in the last 168 hours.  BNP: Invalid input(s): POCBNP  CBG: Recent Labs  Lab 10/29/18 0755 10/29/18 1138 10/29/18 1640 10/29/18 2213 10/30/18 0806  GLUCAP 117* 117* 111* 142* 134*    Microbiology: Results for orders placed or performed during  the hospital encounter of 10/20/18  Blood Culture (routine x 2)     Status: None   Collection Time: 10/20/18  5:10 PM  Result Value Ref Range Status   Specimen Description BLOOD RIGHT WRIST  Final   Special Requests   Final    BOTTLES DRAWN AEROBIC AND ANAEROBIC Blood Culture adequate volume   Culture   Final    NO GROWTH 5 DAYS Performed at [facility], [address] [place], [place] [address]    Report Status 10/25/2018 FINAL  Final  Blood Culture (routine x 2)     Status: None   Collection Time: 10/20/18  5:10 PM  Result Value Ref Range Status   Specimen Description BLOOD RIGHT HAND  Final   Special Requests   Final    BOTTLES DRAWN AEROBIC AND ANAEROBIC Blood Culture results may not be optimal due to an inadequate volume of blood received in culture bottles   Culture   Final    NO GROWTH 5 DAYS Performed at [facility], [place] [address] [place], [place] [address]    Report Status 10/25/2018 FINAL   Final  MRSA PCR Screening     Status: None   Collection Time: 10/21/18  3:49 AM  Result Value Ref Range Status   MRSA by PCR NEGATIVE NEGATIVE Final    Comment:        The GeneXpert MRSA Assay (FDA approved for NASAL specimens only), is one component of a comprehensive MRSA colonization surveillance program. It is not intended to diagnose MRSA infection nor to guide or monitor treatment for MRSA infections. Performed at [facility], [place] [address] [place], [place] [address]   Respiratory Panel by PCR     Status: None   Collection Time: 10/22/18  5:03 PM  Result Value Ref Range Status   Adenovirus NOT DETECTED NOT DETECTED Final   Coronavirus 229E NOT DETECTED NOT DETECTED Final   Coronavirus HKU1 NOT DETECTED NOT DETECTED Final   Coronavirus NL63 NOT DETECTED NOT DETECTED Final   Coronavirus OC43 NOT DETECTED NOT DETECTED Final   Metapneumovirus NOT DETECTED NOT DETECTED Final   Rhinovirus / Enterovirus NOT DETECTED NOT DETECTED Final   Influenza A NOT DETECTED NOT DETECTED Final   Influenza B NOT DETECTED NOT DETECTED Final   Parainfluenza Virus 1 NOT DETECTED NOT DETECTED Final   Parainfluenza Virus 2 NOT DETECTED NOT DETECTED Final   Parainfluenza Virus 3 NOT DETECTED NOT DETECTED Final   Parainfluenza Virus 4 NOT DETECTED NOT DETECTED Final   Respiratory Syncytial Virus NOT DETECTED NOT DETECTED Final   Bordetella pertussis NOT DETECTED NOT DETECTED Final   Chlamydophila pneumoniae NOT DETECTED NOT DETECTED Final   Mycoplasma pneumoniae NOT DETECTED NOT DETECTED Final    Comment: Performed at [facility], [place] [address] [place], [place] [address]    Coagulation Studies: No results for input(s): LABPROT, INR in the last 72 hours.  Urinalysis: No results for input(s): COLORURINE, LABSPEC, PHURINE, GLUCOSEU, HGBUR, BILIRUBINUR, KETONESUR, PROTEINUR, UROBILINOGEN, NITRITE, LEUKOCYTESUR in the last 72 hours.  Invalid input(s): APPERANCEUR     Imaging: Nm Bone Scan Whole Body  Result Date: 10/28/2018 CLINICAL DATA:  New L3 and right femoral head lytic lesions. EXAM: NUCLEAR MEDICINE WHOLE BODY BONE SCAN TECHNIQUE: Whole body anterior and posterior images were obtained approximately 3 hours after intravenous injection of radiopharmaceutical. RADIOPHARMACEUTICALS:  20.3 mCi Technetium-30m MDP IV COMPARISON:  CT abdomen pelvis dated October 27, 2018. FINDINGS: There  are no foci of increased or decreased radiotracer uptake to suggest osseous metastatic disease. There is degenerative type uptake in the bilateral shoulders. Normal physiologic activity is identified within the kidneys and urinary bladder. IMPRESSION: Negative study. Specifically, no abnormal uptake in the lumbar spine or right femoral head. Electronically Signed   By: Titus Dubin M.D.   On: 10/28/2018 15:06     Medications:   . sodium chloride 250 mL (10/22/18 1033)  . sodium chloride    . sodium chloride     . aspirin EC  325 mg Oral Daily  . calcium acetate  2,668 mg Oral TID WC  . Chlorhexidine Gluconate Cloth  6 each Topical Q0600  . Chlorhexidine Gluconate Cloth  6 each Topical Q0600  . collagenase   Topical Daily  . metoprolol tartrate  50 mg Oral BID  . saccharomyces boulardii  250 mg Oral BID   sodium chloride, sodium chloride, sodium chloride, acetaminophen, alteplase, heparin, hydrOXYzine, ipratropium-albuterol, lidocaine (PF), lidocaine-prilocaine, loperamide, ondansetron (ZOFRAN) IV, pentafluoroprop-tetrafluoroeth  Assessment/ Plan:  1. AMS - BC's no growth for 3 days. Lactic acid improved to 1.5. Ammonia elevated, 75. Negative CT head and CXR. Abdominal USunrevealing. On ABX. Per primary. CT abdomen right pleural effusion creatinine loss opacities central perihilar regions consistent with pulmonary edema dense coronary artery calcification.  Hepatobiliary shows status post cholecystectomy liver homogenous without focal masses pancreas shows no  ductal dilatation is unremarkable.  Stomach and small bowel loops are normal prominent bilateral adrenal glands with no evidence of masses.  Small bilateral kidneys.  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  Negative study on radionucleotide bone scan with no abnormal uptake in lumbar spine or right femoral head.  PSA is active and in process.  SPEP pending and proceeding to MRI evaluation 2. Acute hypoxic respiratory failure- on 2 L nasal cannula.  Ultrafiltered 1.4 L 10/29/2018.  Completed course of azithromycin and ceftriaxone 3. ESRD - On HD MWF. Tolerated HD well on 10/29/2018 with some needle dislodgment but very little change in hemoglobin.  Will need to follow as an in center.  We will plan dialysis treatment 10/31/2018 4. Hypertension/volume - BP improved.  Continue to challenge dry weight.  Noted removal of 1.4 L 10/29/2018 5. Access some bleeding noted from access.  After needle dislodgment we will continue to follow hemoglobin.  This can be done as an outpatient 6. Anemia of CKD -hemoglobin decreased to 9.4 we will continue to follow 7. Secondary Hyperparathyroidism - Ca and phos at goal. Continue VDRA and binders. 8. Nutrition - Renal diet with fluid restrictions. Nepro.  9. DMT2 - per primary 10. Diastolic dysfunction/aortic stenosis 11. OSA 12. A fib - not on anticoagulation 13. Hx GIB 2/2 coumadin    LOS: 9 [name] @TODAY @10 :38 AM

## 2018-10-30 NOTE — Progress Notes (Addendum)
[facility] PROGRESS NOTE  [name] YPP:[id number] DOB: October 06, 1941 DOA: 10/20/2018  PCP: Patient, No Pcp Per  Brief History/Interval Summary: 77 y.o.malewith medical history significant ofend-stage renal disease on hemodialysis Monday Wednesday Friday, A. fib, CHF, diabetes,hypertension, hyperlipidemia,OSA presented to the hospital from his nursing home for further evaluation of altered mental status. He presented with hypotension and AMS concerning for sepsis.  He was started on broad spectrum antibiotics.  He was noted to have an oxygen requirement and had a CT concerning for edema vs inflammation or atypical infection.  He was started on coverage for CAP.  The CT was also notable for a moderate R sided effusion.  IR was consulted, but there was not enough to tap.  He was also noted to have an elevated ammonia, but he's had intermittent diarrhea prior to presentation and worsening diarrhea since lactlose was started.    Consultants: Nephrology.  Procedures: Hemodialysis  Antibiotics: Patient received broad-spectrum coverage with vancomycin cefepime and Flagyl. She completed a course of ceftriaxone and azithromycin on 11/2  Subjective/Interval History: Patient denies any complaints this morning.  No shortness of breath.  Occasional dry cough.  ROS: Denies any chest pain  Objective:  Vital Signs  Vitals:   10/29/18 1212 10/29/18 2142 10/30/18 0120 10/30/18 0245  BP: (!) 114/51 117/62  (!) 109/55  Pulse: 78 91 77 72  Resp: _0 Temp: 98.4 F (36.9 C) 97.6 F (36.4 C)  97.9 F (36.6 C)  TempSrc: Oral Oral  Oral  SpO2: 100% 98% 94% 95%  Weight:    71.3 kg  Height:        Intake/Output Summary (Last 24 hours) at 10/30/2018 1018 Last data filed at 10/30/2018 0943 Gross per 24 hour  Intake 870 ml  Output 0 ml  Net 870 ml   Filed Weights   10/29/18 0013 10/29/18 0315 10/30/18 0245  Weight: 82.7 kg 81.5 kg 71.3 kg    General appearance: Awake  alert.  In no distress Resp: Coarse sounds bilaterally.  Stable.  Normal effort.  No wheezing or rhonchi. Cardio: S1-S2 is normal regular.  No S3-S4.  No rubs murmurs or bruit GI: Abdomen remains soft.  Nontender nondistended.  No masses organomegaly Extremities: No edema Neurologic: Awake alert.  In no distress  Lab Results:  Data Reviewed: I have personally reviewed following labs and imaging studies  CBC: Recent Labs  Lab 10/26/18 0538 10/26/18 1954 10/28/18 0542 10/29/18 0106 10/29/18 0431  WBC 8.5 9.9 7.1 8.6 7.8  HGB 10.3* 10.0* 9.8* 10.4* 9.4*  HCT 33.4* 33.4* 32.0* 32.7* 31.5*  MCV 106.0* 106.7* 104.2* 104.1* 105.0*  PLT 121* 131* 116* 126* 125*    Basic Metabolic Panel: Recent Labs  Lab 10/24/18 0614 10/25/18 0759 10/25/18 1448 10/26/18 0538 10/26/18 1954 10/28/18 0542 10/29/18 0106  NA 142  --  143 143 138 136 134*  K 5.8*  --  4.1 4.2 5.1 3.9 4.7  CL 103  --  102 102 97* 100 98  CO2 23  --  _1 GLUCOSE 113*  --  86 90 134* 83 115*  BUN 66*  --  33* 48* 57* 24* 41*  CREATININE 7.80*  --  5.58* 6.97* 7.79* 4.77* 6.05*  CALCIUM 9.8  --  9.1 9.2 9.1 8.6* 9.1  MG 2.5* 2.2  --  2.3  --   --   --   PHOS  --   --   --   --  5.5*  --  4.5    GFR: Estimated Creatinine Clearance: 9.2 mL/min (A) (by C-G formula based on SCr of 6.05 mg/dL (H)).  Liver Function Tests: Recent Labs  Lab 10/24/18 6153 10/25/18 0759 10/26/18 0538 10/26/18 1954 10/29/18 0106  AST 30 32 24  --   --   ALT _0 --   --   ALKPHOS 206* 182* 170*  --   --   BILITOT 1.7* 1.2 1.1  --   --   PROT 6.9 7.0 6.8  --   --   ALBUMIN 3.1* 3.1* 3.0* 2.8* 2.9*    Recent Labs  Lab 10/24/18 0614  LIPASE 32   Recent Labs  Lab 10/25/18 0759 10/26/18 0538  AMMONIA 32 42*    Coagulation Profile: Recent Labs  Lab 10/24/18 0614  INR 1.58    CBG: Recent Labs  Lab 10/29/18 0755 10/29/18 1138 10/29/18 1640 10/29/18 2213 10/30/18 0806  GLUCAP 117* 117* 111*  142* 134*     Recent Results (from the past 240 hour(s))  Blood Culture (routine x 2)     Status: None   Collection Time: 10/20/18  5:10 PM  Result Value Ref Range Status   Specimen Description BLOOD RIGHT WRIST  Final   Special Requests   Final    BOTTLES DRAWN AEROBIC AND ANAEROBIC Blood Culture adequate volume   Culture   Final    NO GROWTH 5 DAYS Performed at [facility], [place] [address], [place], [place] [address]    Report Status 10/25/2018 FINAL  Final  Blood Culture (routine x 2)     Status: None   Collection Time: 10/20/18  5:10 PM  Result Value Ref Range Status   Specimen Description BLOOD RIGHT HAND  Final   Special Requests   Final    BOTTLES DRAWN AEROBIC AND ANAEROBIC Blood Culture results may not be optimal due to an inadequate volume of blood received in culture bottles   Culture   Final    NO GROWTH 5 DAYS Performed at [facility], [place] [address], [place], [place] [address]    Report Status 10/25/2018 FINAL  Final  MRSA PCR Screening     Status: None   Collection Time: 10/21/18  3:49 AM  Result Value Ref Range Status   MRSA by PCR NEGATIVE NEGATIVE Final    Comment:        The GeneXpert MRSA Assay (FDA approved for NASAL specimens only), is one component of a comprehensive MRSA colonization surveillance program. It is not intended to diagnose MRSA infection nor to guide or monitor treatment for MRSA infections. Performed at [facility], [place] [address] [place], [name] [address]   Respiratory Panel by PCR     Status: None   Collection Time: 10/22/18  5:03 PM  Result Value Ref Range Status   Adenovirus NOT DETECTED NOT DETECTED Final   Coronavirus 229E NOT DETECTED NOT DETECTED Final   Coronavirus HKU1 NOT DETECTED NOT DETECTED Final   Coronavirus NL63 NOT DETECTED NOT DETECTED Final   Coronavirus OC43 NOT DETECTED NOT DETECTED Final   Metapneumovirus NOT DETECTED NOT DETECTED Final   Rhinovirus / Enterovirus NOT DETECTED NOT  DETECTED Final   Influenza A NOT DETECTED NOT DETECTED Final   Influenza B NOT DETECTED NOT DETECTED Final   Parainfluenza Virus 1 NOT DETECTED NOT DETECTED Final   Parainfluenza Virus 2 NOT DETECTED NOT DETECTED Final   Parainfluenza Virus 3 NOT  DETECTED NOT DETECTED Final   Parainfluenza Virus 4 NOT DETECTED NOT DETECTED Final   Respiratory Syncytial Virus NOT DETECTED NOT DETECTED Final   Bordetella pertussis NOT DETECTED NOT DETECTED Final   Chlamydophila pneumoniae NOT DETECTED NOT DETECTED Final   Mycoplasma pneumoniae NOT DETECTED NOT DETECTED Final    Comment: Performed at [facility], [place] [address], [place], [place] [address]      Radiology Studies: Nm Bone Scan Whole Body  Result Date: 10/28/2018 CLINICAL DATA:  New L3 and right femoral head lytic lesions. EXAM: NUCLEAR MEDICINE WHOLE BODY BONE SCAN TECHNIQUE: Whole body anterior and posterior images were obtained approximately 3 hours after intravenous injection of radiopharmaceutical. RADIOPHARMACEUTICALS:  20.3 mCi Technetium-54mMDP IV COMPARISON:  CT abdomen pelvis dated October 27, 2018. FINDINGS: There are no foci of increased or decreased radiotracer uptake to suggest osseous metastatic disease. There is degenerative type uptake in the bilateral shoulders. Normal physiologic activity is identified within the kidneys and urinary bladder. IMPRESSION: Negative study. Specifically, no abnormal uptake in the lumbar spine or right femoral head. Electronically Signed   By: WTitus DubinM.D.   On: 10/28/2018 15:06     Medications:  Scheduled: . aspirin EC  325 mg Oral Daily  . calcium acetate  2,668 mg Oral TID WC  . Chlorhexidine Gluconate Cloth  6 each Topical Q0600  . Chlorhexidine Gluconate Cloth  6 each Topical Q0600  . collagenase   Topical Daily  . metoprolol tartrate  50 mg Oral BID  . saccharomyces boulardii  250 mg Oral BID   Continuous: . sodium chloride 250 mL (10/22/18 1033)  . sodium chloride    .  sodium chloride     PKZL:DJTTSVchloride, sodium chloride, sodium chloride, acetaminophen, alteplase, heparin, hydrOXYzine, ipratropium-albuterol, lidocaine (PF), lidocaine-prilocaine, loperamide, ondansetron (ZOFRAN) IV, pentafluoroprop-tetrafluoroeth    Assessment/Plan:  Acute hypoxic respiratory failure Patient's respiratory status has improved.  He is saturating normal on room air.  Concern was for pneumonia along with pulmonary edema.  Patient has improved.  He has completed a course of antibiotics.  He underwent dialysis as well.  Chest x-ray was repeated and shows improvement in interstitial edema.    Lytic lesions CT scan of the abdomen pelvis did show lytic lesions in the right femoral head as well as L3 vertebral body.  Patient without any known history of cancer.  These findings were discussed with the patient.  Bone scan was done which did not show any concerning lesions in the sites.  Discussed with the radiology today.  Bone scan may not necessarily show lesions caused by multiple myeloma.  They recommend MRI for further evaluation.  Since patient will be in the hospital for an additional day this will be ordered.  MRI of the right femoral head as well as the lumbar spine.  Unlikely to be metastatic process due to negative bone scan.  PSA levels were ordered and are pending. Enlarged prostate noted on CT scan. SPEP ordered as well.  Discussed with Dr. WJustin Mendwith nephrology.  Sepsis with elevated lactic acid level and hypotension Likely due to pneumonia.  Cultures negative.  WBC has been normal.  He has completed 7-day course of antibiotics.  Sepsis physiology has resolved.    Acute metabolic encephalopathy Alteration in mental status most likely due to metabolic derangements as well as hypoxia.  Mental status now back to baseline.  There was also some concern for hyperammonemia.  He was given lactulose resulting in explosive diarrhea.  Normal B12 level noted.  LFTs have been normal.  No  further work-up necessary at this time.   Elevated ammonia level Etiology unclear.  There was some concern for liver cirrhosis due to thrombocytopenia low albumin and elevated INR.  Lactulose currently on hold due to diarrhea.  CT scan of the abdomen did not show any abnormalities in the liver.  Nausea and vomiting The symptoms have resolved.    Diarrhea Most likely due to lactulose.  Appears to have improved.  CT scan did shows significant stool burden.  Is quite likely patient had been constipated for a while.  We will not give him any more Imodium.  Allow him to have normal bowel movements.  TSH normal  Elevated alkaline phosphatase Right upper quadrant ultrasound was unremarkable.  No abnormalities noted in the hepatobiliary system on Ct scan.  He is status post cholecystectomy.  Recommend outpatient monitoring.  Right-sided pleural effusion Thoracentesis was ordered however no significant fluid was noted when he went down for the procedure.  No significant effusion noted on chest x-ray from 11/1.  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  Underwent dialysis during the course of this hospital stay.  Did have some bleeding from his dialysis site on 11/1 but was controlled easily.  Mild drop in hemoglobin noted.  We will recheck tomorrow.    Paroxysmal atrial fibrillation Mild RVR noted during earlier part of this hospitalization.  Metoprolol dose was initially increased to 100 mg twice daily.  Rate remains very well controlled.  Not on anticoagulation. As per cardiology note from 2018 he is not on warfarin due to history of GI bleed.  Dose of metoprolol was reduced due to borderline low blood pressures.  Chronic diastolic CHF Stable.  Volume being managed with hemodialysis.  Diabetes mellitus type 2 HbA1c 4.2.  Recommend stopping his Prandin.  Continue to monitor CBGs periodically.  Essential hypertension Borderline low blood pressures  noted but stable.  Asymptomatic.  Obstructive sleep apnea Continue CPAP  Anemia of chronic disease Stable.  Thrombocytopenia Stable.  No evidence of overt bleeding.   DVT Prophylaxis: SCD;s    Code Status: Full code Family Communication: Discussed with the patient Disposition Plan: Patient was discharged yesterday but did not return to his facility.  Since he will be in the hospital we will proceed with MRI of his lumbar spine as well as right hip for further evaluation of the lytic lesion noted on CT scan.    LOS: 9 days   [facility] Pager [phone] 10/30/2018, 10:18 AM  If 7PM-7AM, please contact night-coverage at [email or web address], password [email or web address]

## 2018-10-30 NOTE — Progress Notes (Signed)
Patient is waiting on a reattempt of  MRI Hips.  Plan to give haldol at transportation's arrival

## 2018-10-31 ENCOUNTER — Inpatient Hospital Stay (HOSPITAL_COMMUNITY): Payer: Medicare Other

## 2018-10-31 LAB — CBC
HEMATOCRIT: 30.8 % — AB (ref 39.0–52.0)
Hemoglobin: 9.5 g/dL — ABNORMAL LOW (ref 13.0–17.0)
MCH: 32.4 pg (ref 26.0–34.0)
MCHC: 30.8 g/dL (ref 30.0–36.0)
MCV: 105.1 fL — ABNORMAL HIGH (ref 80.0–100.0)
NRBC: 0 % (ref 0.0–0.2)
Platelets: 127 10*3/uL — ABNORMAL LOW (ref 150–400)
RBC: 2.93 MIL/uL — ABNORMAL LOW (ref 4.22–5.81)
RDW: 18.5 % — ABNORMAL HIGH (ref 11.5–15.5)
WBC: 7.7 10*3/uL (ref 4.0–10.5)

## 2018-10-31 LAB — RENAL FUNCTION PANEL
Albumin: 3 g/dL — ABNORMAL LOW (ref 3.5–5.0)
Anion gap: 13 (ref 5–15)
BUN: 61 mg/dL — ABNORMAL HIGH (ref 8–23)
CALCIUM: 9.3 mg/dL (ref 8.9–10.3)
CO2: 23 mmol/L (ref 22–32)
Chloride: 102 mmol/L (ref 98–111)
Creatinine, Ser: 8.53 mg/dL — ABNORMAL HIGH (ref 0.61–1.24)
GFR, EST AFRICAN AMERICAN: 6 mL/min — AB (ref >60)
GFR, EST NON AFRICAN AMERICAN: 5 mL/min — AB (ref >60)
GLUCOSE: 96 mg/dL (ref 70–99)
POTASSIUM: 6 mmol/L — AB (ref 3.5–5.1)
Phosphorus: 4.9 mg/dL — ABNORMAL HIGH (ref 2.5–4.6)
SODIUM: 138 mmol/L (ref 135–145)

## 2018-10-31 LAB — GLUCOSE, CAPILLARY
GLUCOSE-CAPILLARY: 76 mg/dL (ref 70–99)
Glucose-Capillary: 93 mg/dL (ref 70–99)
Glucose-Capillary: 108 mg/dL — ABNORMAL HIGH (ref 70–99)

## 2018-10-31 MED ORDER — DARBEPOETIN ALFA 60 MCG/0.3ML IJ SOSY
60.0000 ug | PREFILLED_SYRINGE | Freq: Once | INTRAMUSCULAR | Status: AC
  Administered 2018-10-31: 60 ug via INTRAVENOUS

## 2018-10-31 MED ORDER — DARBEPOETIN ALFA 60 MCG/0.3ML IJ SOSY
PREFILLED_SYRINGE | INTRAMUSCULAR | Status: AC
  Filled 2018-10-31: qty 0.3

## 2018-10-31 NOTE — Progress Notes (Signed)
PT Cancellation Note  Patient Details Name: [name] MRN: [id number] DOB: 07/14/1941   Cancelled Treatment:    Reason Eval/Treat Not Completed: Patient at procedure or test/unavailable. Pt in MRI earlier this AM and now in HD. PT to re-attempt as time allows.   Lorriane Shire 10/31/2018, 9:52 AM   Lorrin Goodell, PT  Office # [phone] Pager [phone]

## 2018-10-31 NOTE — Plan of Care (Signed)
  Problem: Health Behavior/Discharge Planning: Goal: Ability to manage health-related needs will improve Outcome: Progressing   Problem: Clinical Measurements: Goal: Ability to maintain clinical measurements within normal limits will improve Outcome: Progressing Goal: Will remain free from infection Outcome: Progressing Goal: Diagnostic test results will improve Outcome: Progressing Goal: Cardiovascular complication will be avoided Outcome: Progressing   Problem: Activity: Goal: Risk for activity intolerance will decrease Outcome: Progressing   Problem: Nutrition: Goal: Adequate nutrition will be maintained Outcome: Progressing   Problem: Elimination: Goal: Will not experience complications related to bowel motility Outcome: Progressing Goal: Will not experience complications related to urinary retention Outcome: Progressing   Problem: Pain Managment: Goal: General experience of comfort will improve Outcome: Progressing   Problem: Safety: Goal: Ability to remain free from injury will improve Outcome: Progressing   Problem: Skin Integrity: Goal: Risk for impaired skin integrity will decrease Outcome: Progressing   Problem: Education: Goal: Knowledge of General Education information will improve Description Including pain rating scale, medication(s)/side effects and non-pharmacologic comfort measures Outcome: Not Met (add Reason)   Problem: Clinical Measurements: Goal: Respiratory complications will improve Outcome: Not Met (add Reason)   Problem: Coping: Goal: Level of anxiety will decrease Outcome: Not Met (add Reason)

## 2018-10-31 NOTE — Progress Notes (Signed)
10/31/18 @ 1655  2nd attempt at MRI. Pt constantly moving and shifting on scan table after being asked to stay still for scan. Pt already medicated for exam. Unable to obtain diagnostic imaging after repeated attempts. RN aware of situation.

## 2018-10-31 NOTE — Progress Notes (Signed)
Pt ultimately agreeable to CPAP during this shift last night after initially refusing. CPAP placed by respiratory therapist. Will continue to monitor.

## 2018-10-31 NOTE — Progress Notes (Signed)
Called MRI and they said they will come back for the patient when they are finishing up with there others first. They do not have an exact time.

## 2018-10-31 NOTE — Progress Notes (Signed)
[facility] ROUNDING NOTE   Subjective:   Seen on HD.  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   Objective:  Vital signs in last 24 hours:  Temp:  [97.6 F (36.4 C)-98.3 F (36.8 C)] 98.3 F (36.8 C) (11/04 0755) Pulse Rate:  [71-88] 72 (11/04 0930) Resp:  [20-24] 21 (11/04 0930) BP: (83-141)/(41-86) 94/41 (11/04 0930) SpO2:  [98 %-100 %] 98 % (11/04 0755) Weight:  [71.7 kg] 71.7 kg (11/04 0755)  Weight change: 0.408 kg Filed Weights   10/30/18 0245 10/31/18 0611 10/31/18 0755  Weight: 71.3 kg 71.7 kg 71.7 kg    Intake/Output: I/O last 3 completed shifts: In: 1160 [P.O.:1160] Out: 2 [Stool:2]   Intake/Output this shift:  No intake/output data recorded. Gen - chronically ill appearing, restless, trying to take BP cuff off CVS-tachycardic RS- CTA slightly diminished at base ABD- BS present soft non-distended EXT- no edema left upper extremity AV fistula with thrill and bruit   Basic Metabolic Panel: Recent Labs  Lab 10/25/18 0759  10/26/18 0538 10/26/18 1954 10/28/18 0542 10/29/18 0106 10/31/18 0651  NA  --    < > 143 138 136 134* 138  K  --    < > 4.2 5.1 3.9 4.7 6.0*  CL  --    < > 102 97* 100 98 102  CO2  --    < > 26 29 28 27 23   GLUCOSE  --    < > 90 134* 83 115* 96  BUN  --    < > 48* 57* 24* 41* 61*  CREATININE  --    < > 6.97* 7.79* 4.77* 6.05* 8.53*  CALCIUM  --    < > 9.2 9.1 8.6* 9.1 9.3  MG 2.2  --  2.3  --   --   --   --   PHOS  --   --   --  5.5*  --  4.5 4.9*   < > = values in this interval not displayed.    Liver Function Tests: Recent Labs  Lab 10/25/18 0759 10/26/18 0538 10/26/18 1954 10/29/18 0106 10/31/18 0651  AST 32 24  --   --   --   ALT 26 22  --   --   --   ALKPHOS 182* 170*  --   --   --   BILITOT 1.2 1.1  --   --   --   PROT 7.0 6.8  --   --   --   ALBUMIN 3.1* 3.0* 2.8* 2.9* 3.0*   No results for input(s): LIPASE,  AMYLASE in the last 168 hours. Recent Labs  Lab 10/25/18 0759 10/26/18 0538  AMMONIA 32 42*    CBC: Recent Labs  Lab 10/28/18 0542 10/29/18 0106 10/29/18 0431 10/30/18 1111 10/31/18 0651  WBC 7.1 8.6 7.8 7.3 7.7  HGB 9.8* 10.4* 9.4* 8.7* 9.5*  HCT 32.0* 32.7* 31.5* 27.8* 30.8*  MCV 104.2* 104.1* 105.0* 104.9* 105.1*  PLT 116* 126* 125* 111* 127*    Cardiac Enzymes: No results for input(s): CKTOTAL, CKMB, CKMBINDEX, TROPONINI in the last 168 hours.  BNP: Invalid input(s): POCBNP  CBG: Recent Labs  Lab 10/30/18 0806 10/30/18 1117 10/30/18 1654 10/30/18 2134 10/31/18 0644  GLUCAP 134* 118* 122* 122* 93    Microbiology: Results for orders placed or performed during the hospital encounter of 10/20/18  Blood Culture (routine x 2)  Status: None   Collection Time: 10/20/18  5:10 PM  Result Value Ref Range Status   Specimen Description BLOOD RIGHT WRIST  Final   Special Requests   Final    BOTTLES DRAWN AEROBIC AND ANAEROBIC Blood Culture adequate volume   Culture   Final    NO GROWTH 5 DAYS Performed at [facility], [address], [place], [place] [address]    Report Status 10/25/2018 FINAL  Final  Blood Culture (routine x 2)     Status: None   Collection Time: 10/20/18  5:10 PM  Result Value Ref Range Status   Specimen Description BLOOD RIGHT HAND  Final   Special Requests   Final    BOTTLES DRAWN AEROBIC AND ANAEROBIC Blood Culture results may not be optimal due to an inadequate volume of blood received in culture bottles   Culture   Final    NO GROWTH 5 DAYS Performed at [facility], [place] [address] [place], [place] [address]    Report Status 10/25/2018 FINAL  Final  MRSA PCR Screening     Status: None   Collection Time: 10/21/18  3:49 AM  Result Value Ref Range Status   MRSA by PCR NEGATIVE NEGATIVE Final    Comment:        The GeneXpert MRSA Assay (FDA approved for NASAL specimens only), is one component of a comprehensive MRSA  colonization surveillance program. It is not intended to diagnose MRSA infection nor to guide or monitor treatment for MRSA infections. Performed at [facility], [place] [address] [place], [place] [address]   Respiratory Panel by PCR     Status: None   Collection Time: 10/22/18  5:03 PM  Result Value Ref Range Status   Adenovirus NOT DETECTED NOT DETECTED Final   Coronavirus 229E NOT DETECTED NOT DETECTED Final   Coronavirus HKU1 NOT DETECTED NOT DETECTED Final   Coronavirus NL63 NOT DETECTED NOT DETECTED Final   Coronavirus OC43 NOT DETECTED NOT DETECTED Final   Metapneumovirus NOT DETECTED NOT DETECTED Final   Rhinovirus / Enterovirus NOT DETECTED NOT DETECTED Final   Influenza A NOT DETECTED NOT DETECTED Final   Influenza B NOT DETECTED NOT DETECTED Final   Parainfluenza Virus 1 NOT DETECTED NOT DETECTED Final   Parainfluenza Virus 2 NOT DETECTED NOT DETECTED Final   Parainfluenza Virus 3 NOT DETECTED NOT DETECTED Final   Parainfluenza Virus 4 NOT DETECTED NOT DETECTED Final   Respiratory Syncytial Virus NOT DETECTED NOT DETECTED Final   Bordetella pertussis NOT DETECTED NOT DETECTED Final   Chlamydophila pneumoniae NOT DETECTED NOT DETECTED Final   Mycoplasma pneumoniae NOT DETECTED NOT DETECTED Final    Comment: Performed at [facility], [place] [address] [place], [place] [address]    Coagulation Studies: No results for input(s): LABPROT, INR in the last 72 hours.  Urinalysis: No results for input(s): COLORURINE, LABSPEC, PHURINE, GLUCOSEU, HGBUR, BILIRUBINUR, KETONESUR, PROTEINUR, UROBILINOGEN, NITRITE, LEUKOCYTESUR in the last 72 hours.  Invalid input(s): APPERANCEUR    Imaging: Mr Lumbar Spine Wo Contrast  Result Date: 10/30/2018 CLINICAL DATA:  Abnormal CT findings in lumbar spine. Negative bone scan. EXAM: MRI LUMBAR SPINE WITHOUT CONTRAST TECHNIQUE: Multiplanar, multisequence MR imaging of the lumbar spine was performed. No intravenous contrast was  administered. COMPARISON:  Bone scan 10/28/2018.  CT abdomen pelvis 10/27/2018 FINDINGS: The patient was not able to complete the study. Limited imaging was obtained which is significantly degraded by motion. Sagittal T2 and STIR  images only were obtained. Normal alignment. No fracture. Multilevel disc degeneration. Review of the prior CT reveals lytic lesion superior L3 vertebral body which appears to communicate with the L2-3 disc space. Lytic lesion inferior endplate of L3 also communicates with the L3-4 disc space. Similar lesion inferior endplate of L4 communicates with the L4-5 disc space. 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 They are identified on the MRI however there is limited detail. IMPRESSION: Limited incomplete lumbar MRI. Extensive motion artifact. The patient was not complete the study. 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 Electronically Signed   By: Franchot Gallo M.D.   On: 10/30/2018 13:23     Medications:   . sodium chloride 250 mL (10/22/18 1033)  . sodium chloride    . sodium chloride     . aspirin EC  325 mg Oral Daily  . calcium acetate  2,668 mg Oral TID WC  . collagenase   Topical Daily  . Gerhardt's butt cream   Topical QID  . metoprolol tartrate  50 mg Oral BID  . saccharomyces boulardii  250 mg Oral BID   sodium chloride, sodium chloride, sodium chloride, acetaminophen, alteplase, haloperidol lactate, heparin, hydrOXYzine, ipratropium-albuterol, lidocaine (PF), lidocaine-prilocaine, loperamide, ondansetron (ZOFRAN) IV, pentafluoroprop-tetrafluoroeth   MWF- AF  4hrs, BFR 400, DFR AF 1.5,  EDW 73kg, 2K/ 2.25Ca, Linear Na  Access: LU AVF  Heparin 2500 unit bolus Mircera 100 mcg q2wks - last 10/21 Hectorol 54mcg IV qHD   Venofer 50mg  IV qwk  Assessment/ Plan:  1. AMS -  Multifactorial and resolved back to baseline per report.  He is oriented to self, 2010, "across from [place]ise[place]" today for me.    2. Possible lytic lesions in spine and femoral heads - L spine MRI with motion artifact but thought to be nonmalignant lesions.  MRI hip ordered.  3. Acute hypoxic respiratory failure- on 2 L nasal cannula.  Ultrafiltered 1.4 L 10/29/2018 - UFG 2L today.  Completed course of azithromycin and ceftriaxone 4. ESRD - On HD MWF. HD today per outpt treatment. 5. Hypertension/volume - BP improved and now on low side.  EDW is 73kg, pre HD wt today 71.7kg.   6. Bleeding access - needle dislodged, Hb has been stable. Isolated incident.  CTM.  RN says looks ok today.  7. Anemia of CKD -hemoglobin stable at 9.5. Mircera 100 q2wks - last 10/21. Give aranesp 33mcg today.   8. Secondary Hyperparathyroidism - Ca and phos at goal. Continue VDRA and binders. 9. Nutrition - Renal diet with fluid restrictions. Nepro.  10. DMT2 - per primary 11. Diastolic dysfunction/aortic stenosis 12. OSA 13. A fib - not on anticoagulation 14. Hx GIB 2/2 coumadin - not a current issue.    LOS: [place] @TODAY @11 :07 AM

## 2018-10-31 NOTE — Progress Notes (Signed)
[facility] PROGRESS NOTE  SHAHIN KNIERIM KNL:[id number] DOB: [birth year] DOA: 10/20/2018  PCP: Patient, No Pcp Per  Brief History/Interval Summary: 77 y.o.malewith medical history significant ofend-stage renal disease on hemodialysis Monday Wednesday Friday, A. fib, CHF, diabetes,hypertension, hyperlipidemia,OSA presented to the hospital from his nursing home for further evaluation of altered mental status. He presented with hypotension and AMS concerning for sepsis.  He was started on broad spectrum antibiotics.  He was noted to have an oxygen requirement and had a CT concerning for edema vs inflammation or atypical infection.  He was started on coverage for CAP.  The CT was also notable for a moderate R sided effusion.  IR was consulted, but there was not enough to tap.  He was also noted to have an elevated ammonia, but he's had intermittent diarrhea prior to presentation and worsening diarrhea since lactlose was started.    Consultants: Nephrology.  Procedures: Hemodialysis  Antibiotics: Patient received broad-spectrum coverage with vancomycin cefepime and Flagyl. He completed a course of ceftriaxone and azithromycin on 11/2  Subjective/Interval History: Patient seen at hemodialysis.  He denies any complaints.  He is agreeable to another attempt at MRI.    ROS: Denies any chest pain or shortness of breath  Objective:  Vital Signs  Vitals:   10/31/18 1000 10/31/18 1030 10/31/18 1100 10/31/18 1130  BP: (!) 99/50 (!) 94/41 (!) 110/56 (!) 108/57  Pulse: 78 76 82 86  Resp: 20 19 (!) 21 20  Temp:      TempSrc:      SpO2:      Weight:      Height:        Intake/Output Summary (Last 24 hours) at 10/31/2018 1214 Last data filed at 10/31/2018 0431 Gross per 24 hour  Intake 480 ml  Output 2 ml  Net 478 ml   Filed Weights   10/30/18 0245 10/31/18 0611 10/31/18 0755  Weight: 71.3 kg 71.7 kg 71.7 kg    General appearance: Awake alert.  In no distress Resp:  Effort.  Clear to auscultation bilaterally. Cardio: S1-S2 is normal regular.  No S3-S4.  No rubs murmurs or bruit GI: Abdomen is soft.  Nontender nondistended. Extremities: No edema Neurologic: Awake alert.  In no distress  Lab Results:  Data Reviewed: I have personally reviewed following labs and imaging studies  CBC: Recent Labs  Lab 10/28/18 0542 10/29/18 0106 10/29/18 0431 10/30/18 1111 10/31/18 0651  WBC 7.1 8.6 7.8 7.3 7.7  HGB 9.8* 10.4* 9.4* 8.7* 9.5*  HCT 32.0* 32.7* 31.5* 27.8* 30.8*  MCV 104.2* 104.1* 105.0* 104.9* 105.1*  PLT 116* 126* 125* 111* 127*    Basic Metabolic Panel: Recent Labs  Lab 10/25/18 0759  10/26/18 0538 10/26/18 1954 10/28/18 0542 10/29/18 0106 10/31/18 0651  NA  --    < > 143 138 136 134* 138  K  --    < > 4.2 5.1 3.9 4.7 6.0*  CL  --    < > 102 97* 100 98 102  CO2  --    < > '26 29 28 27 23  '$ GLUCOSE  --    < > 90 134* 83 115* 96  BUN  --    < > 48* 57* 24* 41* 61*  CREATININE  --    < > 6.97* 7.79* 4.77* 6.05* 8.53*  CALCIUM  --    < > 9.2 9.1 8.6* 9.1 9.3  MG 2.2  --  2.3  --   --   --   --  PHOS  --   --   --  5.5*  --  4.5 4.9*   < > = values in this interval not displayed.    GFR: Estimated Creatinine Clearance: 6.5 mL/min (A) (by C-G formula based on SCr of 8.53 mg/dL (H)).  Liver Function Tests: Recent Labs  Lab 10/25/18 0759 10/26/18 0538 10/26/18 1954 10/29/18 0106 10/31/18 0651  AST 32 24  --   --   --   ALT 26 22  --   --   --   ALKPHOS 182* 170*  --   --   --   BILITOT 1.2 1.1  --   --   --   PROT 7.0 6.8  --   --   --   ALBUMIN 3.1* 3.0* 2.8* 2.9* 3.0*     Recent Labs  Lab 10/25/18 0759 10/26/18 0538  AMMONIA 32 42*     CBG: Recent Labs  Lab 10/30/18 0806 10/30/18 1117 10/30/18 1654 10/30/18 2134 10/31/18 0644  GLUCAP 134* 118* 122* 122* 93     Recent Results (from the past 240 hour(s))  Respiratory Panel by PCR     Status: None   Collection Time: 10/22/18  5:03 PM  Result Value Ref  Range Status   Adenovirus NOT DETECTED NOT DETECTED Final   Coronavirus 229E NOT DETECTED NOT DETECTED Final   Coronavirus HKU1 NOT DETECTED NOT DETECTED Final   Coronavirus NL63 NOT DETECTED NOT DETECTED Final   Coronavirus OC43 NOT DETECTED NOT DETECTED Final   Metapneumovirus NOT DETECTED NOT DETECTED Final   Rhinovirus / Enterovirus NOT DETECTED NOT DETECTED Final   Influenza A NOT DETECTED NOT DETECTED Final   Influenza B NOT DETECTED NOT DETECTED Final   Parainfluenza Virus 1 NOT DETECTED NOT DETECTED Final   Parainfluenza Virus 2 NOT DETECTED NOT DETECTED Final   Parainfluenza Virus 3 NOT DETECTED NOT DETECTED Final   Parainfluenza Virus 4 NOT DETECTED NOT DETECTED Final   Respiratory Syncytial Virus NOT DETECTED NOT DETECTED Final   Bordetella pertussis NOT DETECTED NOT DETECTED Final   Chlamydophila pneumoniae NOT DETECTED NOT DETECTED Final   Mycoplasma pneumoniae NOT DETECTED NOT DETECTED Final    Comment: Performed at [facility], [place] [address] [place], [place] [address]      Radiology Studies: Mr Lumbar Spine Wo Contrast  Result Date: 10/30/2018 CLINICAL DATA:  Abnormal CT findings in lumbar spine. Negative bone scan. EXAM: MRI LUMBAR SPINE WITHOUT CONTRAST TECHNIQUE: Multiplanar, multisequence MR imaging of the lumbar spine was performed. No intravenous contrast was administered. COMPARISON:  Bone scan 10/28/2018.  CT abdomen pelvis 10/27/2018 FINDINGS: The patient was not able to complete the study. Limited imaging was obtained which is significantly degraded by motion. Sagittal T2 and STIR images only were obtained. Normal alignment. No fracture. Multilevel disc degeneration. Review of the prior CT reveals lytic lesion superior L3 vertebral body which appears to communicate with the L2-3 disc space. Lytic lesion inferior endplate of L3 also communicates with the L3-4 disc space. Similar lesion inferior endplate of L4 communicates with the L4-5 disc space. 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 They are identified on the MRI however there is limited detail. IMPRESSION: Limited incomplete lumbar MRI. Extensive motion artifact. The patient was not complete the study. 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 Electronically Signed   By: Franchot Gallo M.D.   On: 10/30/2018 13:23  Medications:  Scheduled: . aspirin EC  325 mg Oral Daily  . calcium acetate  2,668 mg Oral TID WC  . collagenase   Topical Daily  . Gerhardt's butt cream   Topical QID  . metoprolol tartrate  50 mg Oral BID  . saccharomyces boulardii  250 mg Oral BID   Continuous: . sodium chloride 250 mL (10/22/18 1033)  . sodium chloride    . sodium chloride     PIR:JJOACZ chloride, sodium chloride, sodium chloride, acetaminophen, alteplase, haloperidol lactate, heparin, hydrOXYzine, ipratropium-albuterol, lidocaine (PF), lidocaine-prilocaine, loperamide, ondansetron (ZOFRAN) IV, pentafluoroprop-tetrafluoroeth    Assessment/Plan:  Acute hypoxic respiratory failure She is respiratory status has improved.  He saturating normal on room air.  Patient was treated for pneumonia with 7-day course of antibiotics.  There was also concern for pulmonary edema for which he received hemodialysis.  Chest x-ray shows improvement in interstitial edema.    Lytic lesions CT scan of the abdomen pelvis did show lytic lesions in the right femoral head as well as L3 vertebral body.  Patient without any known history of cancer.  These findings were discussed with the patient.  Bone scan was done which did not show any concerning lesions in the sites.  Discussed with the radiology.  Bone scan may not necessarily show lesions caused by multiple myeloma.  They recommended MRI for further evaluation.  MRI of the lumbar spine was done yesterday.  Image quality was poor as the patient was quite agitated.  However it appears that findings may be more  suggestive of Schmorl's nodes rather than a lytic lesion.  MRI hip to be attempted today.  Discussed with the patient.  SPEP was also ordered per Dr. Jason Nest recommendation.  This can be pursued as an outpatient.  PSA was 6.6.  So not significantly elevated.  Anyway the lesions are noted not suggestive of metastatic disease.   Sepsis with elevated lactic acid level and hypotension Likely due to pneumonia.  Cultures negative.  WBC has been normal.  He has completed 7-day course of antibiotics.  Sepsis physiology has resolved.    Acute metabolic encephalopathy Alteration in mental status most likely due to metabolic derangements as well as hypoxia.  Mental status now back to baseline.  There was also some concern for hyperammonemia.  He was given lactulose resulting in explosive diarrhea.  Normal B12 level noted.  LFTs have been normal.  No further work-up necessary at this time.    Elevated ammonia level Etiology unclear.  There was some concern for liver cirrhosis due to thrombocytopenia low albumin and elevated INR.  Lactulose currently on hold due to diarrhea.  CT scan of the abdomen did not show any abnormalities in the liver.  Nausea and vomiting The symptoms have resolved.    Diarrhea Most likely due to lactulose.  Appears to have improved.  CT scan did shows significant stool burden.  Is quite likely patient had been constipated for a while.  We will not give him any more Imodium.  Allow him to have normal bowel movements.  TSH normal  Elevated alkaline phosphatase Right upper quadrant ultrasound was unremarkable.  No abnormalities noted in the hepatobiliary system on Ct scan.  He is status post cholecystectomy.  Recommend outpatient monitoring.  Right-sided pleural effusion Thoracentesis was ordered however no significant fluid was noted when he went down for the procedure.  No significant effusion noted on chest x-ray from 11/1.  End-stage renal disease on hemodialysis on Monday  Wednesday and Friday  Seen by nephrology.  Underwent dialysis during the course of this hospital stay.  Did have some bleeding from his dialysis site on 11/1 but was controlled easily.  Mild drop in hemoglobin noted.  Hemoglobin remained stable.  Potassium level was noted to be 6 this morning but the patient did undergo dialysis.  Paroxysmal atrial fibrillation Mild RVR noted during earlier part of this hospitalization.  Metoprolol dose was initially increased to 100 mg twice daily.  Rate remains very well controlled.  Not on anticoagulation. As per cardiology note from 2018 he is not on warfarin due to history of GI bleed.  Dose of metoprolol was reduced due to borderline low blood pressures.  Chronic diastolic CHF Stable.  Volume being managed with hemodialysis.  Diabetes mellitus type 2 HbA1c 4.2.  Recommend stopping his Prandin.  Continue to monitor CBGs periodically.  Essential hypertension Borderline low blood pressures noted but stable.  Asymptomatic.  Obstructive sleep apnea Continue CPAP  Anemia of chronic disease Stable.  Thrombocytopenia Stable.  No evidence of overt bleeding.   DVT Prophylaxis: SCD;s    Code Status: Full code Family Communication: Discussed with the patient Disposition Plan: MRI hip to be reattempted today.  Waiting on representative from patient's assisted living facility to come and evaluate patient to determine if he is appropriate to go back to that facility.  Physical therapy had recommended skilled nursing facility for rehab which the patient has declined.     LOS: 10 days   [facility] Pager [phone] 10/31/2018, 12:14 PM  If 7PM-7AM, please contact night-coverage at [email or web address], password [facility]

## 2018-11-01 LAB — BASIC METABOLIC PANEL
Anion gap: 11 (ref 5–15)
BUN: 32 mg/dL — ABNORMAL HIGH (ref 8–23)
CO2: 26 mmol/L (ref 22–32)
CREATININE: 6 mg/dL — AB (ref 0.61–1.24)
Calcium: 9.2 mg/dL (ref 8.9–10.3)
Chloride: 99 mmol/L (ref 98–111)
GFR calc non Af Amer: 8 mL/min — ABNORMAL LOW (ref >60)
GFR, EST AFRICAN AMERICAN: 9 mL/min — AB (ref >60)
Glucose, Bld: 105 mg/dL — ABNORMAL HIGH (ref 70–99)
POTASSIUM: 5 mmol/L (ref 3.5–5.1)
Sodium: 136 mmol/L (ref 135–145)

## 2018-11-01 LAB — GLUCOSE, CAPILLARY
Glucose-Capillary: 85 mg/dL (ref 70–99)
Glucose-Capillary: 103 mg/dL — ABNORMAL HIGH (ref 70–99)
Glucose-Capillary: 100 mg/dL — ABNORMAL HIGH (ref 70–99)
Glucose-Capillary: 108 mg/dL — ABNORMAL HIGH (ref 70–99)

## 2018-11-01 LAB — PROTEIN ELECTROPHORESIS, SERUM
A/G Ratio: 1.1 (ref 0.7–1.7)
ALPHA-2-GLOBULIN: 0.6 g/dL (ref 0.4–1.0)
Albumin ELP: 3.3 g/dL (ref 2.9–4.4)
Alpha-1-Globulin: 0.3 g/dL (ref 0.0–0.4)
Beta Globulin: 0.9 g/dL (ref 0.7–1.3)
GAMMA GLOBULIN: 1.2 g/dL (ref 0.4–1.8)
Globulin, Total: 2.9 g/dL (ref 2.2–3.9)
Total Protein ELP: 6.2 g/dL (ref 6.0–8.5)

## 2018-11-01 MED ORDER — CHLORHEXIDINE GLUCONATE CLOTH 2 % EX PADS: 6.0000 | MEDICATED_PAD | Freq: Every day | CUTANEOUS | Status: DC

## 2018-11-01 NOTE — Clinical Social Work Note (Addendum)
ALF will not take patient until he goes to SNF. Patient aware and agreeable. No facility preference. Provided SNF list for review. Left voicemail for daughter. Will provide update when she calls back.  Dayton Scrape, [place]  2:42 pm Gave patient bed offers but he is still focused on going back to ALF. He wants his daughter to make SNF decision. If she has not returned voicemail from earlier by later this afternoon will try again.  Dayton Scrape, [place]  3:44 pm Tried calling patient's daughter. Was sent to voicemail after two rings. Did not leave second voicemail.  Dayton Scrape, [place] [phone]  3:53 pm  Insurance authorization still pending.  Dayton Scrape, [place]

## 2018-11-01 NOTE — Progress Notes (Signed)
[facility] ROUNDING NOTE   Subjective:   Seen at bedside this morning.  Denies complaints except wishes to go back to ALF soon.  Did not tolerate hip MRI yesterday even with sedation.    Objective:  Vital signs in last 24 hours:  Temp:  [97.8 F (36.6 C)-98.6 F (37 C)] 98.3 F (36.8 C) (11/05 0636) Pulse Rate:  [72-87] 79 (11/05 0636) Resp:  [17-21] 18 (11/05 0636) BP: (94-115)/(41-70) 97/62 (11/05 0636) SpO2:  [93 %-99 %] 97 % (11/05 0636) Weight:  [70 kg] 70 kg (11/05 0139)  Weight change: -0.014 kg Filed Weights   10/31/18 0755 10/31/18 1214 11/01/18 0139  Weight: 71.7 kg 70 kg 70 kg    Intake/Output: I/O last 3 completed shifts: In: 480 [P.O.:480] Out: 1733 [Other:1730; Stool:3]   Intake/Output this shift:  No intake/output data recorded. Gen - chronically ill appearing, lying calm in bed CVS-reg rate, Syst murmur, no rub RS- CTA slightly diminished at base ABD- BS present soft non-distended EXT- no edema left upper extremity AV fistula with thrill and bruit   Basic Metabolic Panel: Recent Labs  Lab 10/26/18 0538 10/26/18 1954 10/28/18 0542 10/29/18 0106 10/31/18 0651 11/01/18 0732  NA 143 138 136 134* 138 136  K 4.2 5.1 3.9 4.7 6.0* 5.0  CL 102 97* 100 98 102 99  CO2 26 29 28 27 23 26   GLUCOSE 90 134* 83 115* 96 105*  BUN 48* 57* 24* 41* 61* 32*  CREATININE 6.97* 7.79* 4.77* 6.05* 8.53* 6.00*  CALCIUM 9.2 9.1 8.6* 9.1 9.3 9.2  MG 2.3  --   --   --   --   --   PHOS  --  5.5*  --  4.5 4.9*  --     Liver Function Tests: Recent Labs  Lab 10/26/18 0538 10/26/18 1954 10/29/18 0106 10/31/18 0651  AST 24  --   --   --   ALT 22  --   --   --   ALKPHOS 170*  --   --   --   BILITOT 1.1  --   --   --   PROT 6.8  --   --   --   ALBUMIN 3.0* 2.8* 2.9* 3.0*   No results for input(s): LIPASE, AMYLASE in the last 168 hours. Recent Labs  Lab 10/26/18 0538  AMMONIA 42*    CBC: Recent Labs  Lab 10/28/18 0542 10/29/18 0106  10/29/18 0431 10/30/18 1111 10/31/18 0651  WBC 7.1 8.6 7.8 7.3 7.7  HGB 9.8* 10.4* 9.4* 8.7* 9.5*  HCT 32.0* 32.7* 31.5* 27.8* 30.8*  MCV 104.2* 104.1* 105.0* 104.9* 105.1*  PLT 116* 126* 125* 111* 127*    Cardiac Enzymes: No results for input(s): CKTOTAL, CKMB, CKMBINDEX, TROPONINI in the last 168 hours.  BNP: Invalid input(s): POCBNP  CBG: Recent Labs  Lab 10/30/18 2134 10/31/18 0644 10/31/18 1249 10/31/18 2320 11/01/18 0750  GLUCAP 122* 93 76 108* 85    Microbiology: Results for orders placed or performed during the hospital encounter of 10/20/18  Blood Culture (routine x 2)     Status: None   Collection Time: 10/20/18  5:10 PM  Result Value Ref Range Status   Specimen Description BLOOD RIGHT WRIST  Final   Special Requests   Final    BOTTLES DRAWN AEROBIC AND ANAEROBIC Blood Culture adequate volume   Culture   Final    NO GROWTH 5 DAYS Performed at [facility], [address], [place], [place] [address]    Report Status 10/25/2018 FINAL  Final  Blood Culture (routine x 2)     Status: None   Collection Time: 10/20/18  5:10 PM  Result Value Ref Range Status   Specimen Description BLOOD RIGHT HAND  Final   Special Requests   Final    BOTTLES DRAWN AEROBIC AND ANAEROBIC Blood Culture results may not be optimal due to an inadequate volume of blood received in culture bottles   Culture   Final    NO GROWTH 5 DAYS Performed at [facility], [place] [address], [place], [place] [address]    Report Status 10/25/2018 FINAL  Final  MRSA PCR Screening     Status: None   Collection Time: 10/21/18  3:49 AM  Result Value Ref Range Status   MRSA by PCR NEGATIVE NEGATIVE Final    Comment:        The GeneXpert MRSA Assay (FDA approved for NASAL specimens only), is one component of a comprehensive MRSA colonization surveillance program. It is not intended to diagnose MRSA infection nor to guide or monitor treatment for MRSA infections. Performed at [facility], [place] [address], [place], [place] [address]   Respiratory Panel by PCR     Status: None   Collection Time: 10/22/18  5:03 PM  Result Value Ref Range Status   Adenovirus NOT DETECTED NOT DETECTED Final   Coronavirus 229E NOT DETECTED NOT DETECTED Final   Coronavirus HKU1 NOT DETECTED NOT DETECTED Final   Coronavirus NL63 NOT DETECTED NOT DETECTED Final   Coronavirus OC43 NOT DETECTED NOT DETECTED Final   Metapneumovirus NOT DETECTED NOT DETECTED Final   Rhinovirus / Enterovirus NOT DETECTED NOT DETECTED Final   Influenza A NOT DETECTED NOT DETECTED Final   Influenza B NOT DETECTED NOT DETECTED Final   Parainfluenza Virus 1 NOT DETECTED NOT DETECTED Final   Parainfluenza Virus 2 NOT DETECTED NOT DETECTED Final   Parainfluenza Virus 3 NOT DETECTED NOT DETECTED Final   Parainfluenza Virus 4 NOT DETECTED NOT DETECTED Final   Respiratory Syncytial Virus NOT DETECTED NOT DETECTED Final   Bordetella pertussis NOT DETECTED NOT DETECTED Final   Chlamydophila pneumoniae NOT DETECTED NOT DETECTED Final   Mycoplasma pneumoniae NOT DETECTED NOT DETECTED Final    Comment: Performed at [facility], [place] [address], [place], [place] [address]    Coagulation Studies: No results for input(s): LABPROT, INR in the last 72 hours.  Urinalysis: No results for input(s): COLORURINE, LABSPEC, PHURINE, GLUCOSEU, HGBUR, BILIRUBINUR, KETONESUR, PROTEINUR, UROBILINOGEN, NITRITE, LEUKOCYTESUR in the last 72 hours.  Invalid input(s): APPERANCEUR    Imaging: Mr Lumbar Spine Wo Contrast  Result Date: 10/30/2018 CLINICAL DATA:  Abnormal CT findings in lumbar spine. Negative bone scan. EXAM: MRI LUMBAR SPINE WITHOUT CONTRAST TECHNIQUE: Multiplanar, multisequence MR imaging of the lumbar spine was performed. No intravenous contrast was administered. COMPARISON:  Bone scan 10/28/2018.  CT abdomen pelvis 10/27/2018 FINDINGS: The patient was not able to complete the study. Limited imaging was obtained  which is significantly degraded by motion. Sagittal T2 and STIR images only were obtained. Normal alignment. No fracture. Multilevel disc degeneration. Review of the prior CT reveals lytic lesion superior L3 vertebral body which appears to communicate with the L2-3 disc space. Lytic lesion inferior endplate of L3 also communicates with the L3-4 disc space. Similar lesion inferior endplate of L4 communicates with the L4-5 disc space. 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 They are  identified on the MRI however there is limited detail. IMPRESSION: Limited incomplete lumbar MRI. Extensive motion artifact. The patient was not complete the study. 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 Electronically Signed   By: Franchot Gallo M.D.   On: 10/30/2018 13:23     Medications:   . sodium chloride 250 mL (10/22/18 1033)   . aspirin EC  325 mg Oral Daily  . calcium acetate  2,668 mg Oral TID WC  . collagenase   Topical Daily  . Gerhardt's butt cream   Topical QID  . metoprolol tartrate  50 mg Oral BID  . saccharomyces boulardii  250 mg Oral BID   sodium chloride, acetaminophen, hydrOXYzine, ipratropium-albuterol, loperamide, ondansetron (ZOFRAN) IV   MWF- AF  4hrs, BFR 400, DFR AF 1.5,  EDW 73kg, 2K/ 2.25Ca, Linear Na  Access: LU AVF  Heparin 2500 unit bolus Mircera 100 mcg q2wks - last 10/21 Hectorol 58mcg IV qHD   Venofer 50mg  IV qwk  Assessment/ Plan:  1. AMS -  Multifactorial and resolved back to baseline per report.  He is oriented to self, 2010, [facility] today for me which is improved from yesterday.  2. Possible lytic lesions in spine and femoral heads - L spine MRI with motion artifact but thought to be nonmalignant lesions.  MRI hip ordered but he couldn't tolerate it.  3. Acute hypoxic respiratory failure- Resolved 4. ESRD - On HD MWF. HD tomorrow per outpt treatment. 5. Hypertension/volume - BP improved and now on low side  but stable. On metoprolol for A fib.  EDW is 73kg, wt this AM 70kg.   6. Bleeding access - needle dislodged during HD treatment, likely due to movement.  Has not recurred.   7. Anemia of CKD -hemoglobin stable at 9.5. Mircera 100 q2wks - last 10/21. Rec'd aranesp 92mcg 11/4.  8. Secondary Hyperparathyroidism - Ca and phos at goal. Continue VDRA and binders. 9. Nutrition - Renal diet with fluid restrictions. Nepro.  10. DMT2 - per primary 11. Diastolic dysfunction/aortic stenosis - managing vol with HD 12. OSA 13. A fib - not on anticoagulation 14. Hx GIB 2/2 coumadin - not a current issue.    LOS: [place] @TODAY @9 :40 AM

## 2018-11-01 NOTE — Progress Notes (Signed)
Physical Therapy Treatment Patient Details Name: [name] MRN: [id number] DOB: 03-Dec-1941 Today's Date: 11/01/2018    History of Present Illness Pt is a 77 y.o. male admitted from ALF on 10/20/18 with AMS and hypotension; worked up for sepsis. Head CT negative for acute changes. Chest CT concerning for edema vs inflammation or atypical infection; pt started on coverage for CAP. 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 PMH includes a-fib, CHF, CKD, DM, HTN, ESRD (HD MWF).   PT Comments    Pt progressing with mobility. Able to transfer and ambulate with RW and minA. Pt with decreased safety awareness and slowed processing. Continue to recommend SNF-level therapies to maximize functional mobility and independence prior to return to ALF.    Follow Up Recommendations  SNF;Supervision/Assistance - 24 hour     Equipment Recommendations  (TBD)    Recommendations for Other Services       Precautions / Restrictions Precautions Precautions: Fall;Other (comment) Precaution Comments: chronic wounds on buttocks and feet  Restrictions Weight Bearing Restrictions: No    Mobility  Bed Mobility Overal bed mobility: Needs Assistance Bed Mobility: Supine to Sit;Sit to Supine   Sidelying to sit: Supervision Supine to sit: Supervision Sit to supine: Supervision   General bed mobility comments: Pt received sitting EOB attempting to get up to bathroom  Transfers Overall transfer level: Needs assistance Equipment used: Rolling walker (2 wheeled) Transfers: Sit to/from Stand Sit to Stand: Min assist         General transfer comment: MinA to assist trunk elevation standing from bed and BSC over toilet  Ambulation/Gait Ambulation/Gait assistance: Min guard;Min assist Gait Distance (Feet): 30 Feet Assistive device: Rolling walker (2 wheeled) Gait Pattern/deviations: Step-through pattern;Decreased stride length;Trunk flexed Gait velocity: Decreased Gait  velocity interpretation: <1.8 ft/sec, indicate of risk for recurrent falls General Gait Details: Unsteady amb with RW and close min guard for balance; intermittent minA to assist RW navigation. Pt not aware he had bowel incontinence while walking   Stairs             Wheelchair Mobility    Modified Rankin (Stroke Patients Only)       Balance Overall balance assessment: Needs assistance Sitting-balance support: Feet supported;No upper extremity supported Sitting balance-Leahy Scale: Good Sitting balance - Comments: able to don/doff socks EOB without LOB    Standing balance support: Single extremity supported;During functional activity;Bilateral upper extremity supported Standing balance-Leahy Scale: Poor Standing balance comment: Reliant on UE support                            Cognition Arousal/Alertness: Awake/alert Behavior During Therapy: Flat affect Overall Cognitive Status: No family/caregiver present to determine baseline cognitive functioning Area of Impairment: Attention;Memory;Safety/judgement;Awareness;Problem solving;Orientation                 Orientation Level: Disoriented to;Time(Said year was 1910, then 1019) Current Attention Level: Selective Memory: Decreased short-term memory   Safety/Judgement: Decreased awareness of safety;Decreased awareness of deficits Awareness: Intellectual Problem Solving: Decreased initiation;Difficulty sequencing;Requires verbal cues;Slow processing        Exercises      General Comments        Pertinent Vitals/Pain Pain Assessment: Faces Faces Pain Scale: Hurts little more Pain Location: Bilateral feet Pain Descriptors / Indicators: Aching;Sore Pain Intervention(s): Monitored during session;Limited activity within patient's tolerance    Home Living  Prior Function            PT Goals (current goals can now be found in the care plan section) Acute Rehab PT  Goals Patient Stated Goal: to go home  PT Goal Formulation: With patient Time For Goal Achievement: 11/11/18 Potential to Achieve Goals: Fair Progress towards PT goals: Progressing toward goals    Frequency    Min 2X/week      PT Plan Current plan remains appropriate    Co-evaluation              AM-PAC PT "6 Clicks" Daily Activity  Outcome Measure  Difficulty turning over in bed (including adjusting bedclothes, sheets and blankets)?: A Little Difficulty moving from lying on back to sitting on the side of the bed? : A Little Difficulty sitting down on and standing up from a chair with arms (e.g., wheelchair, bedside commode, etc,.)?: Unable Help needed moving to and from a bed to chair (including a wheelchair)?: A Little Help needed walking in hospital room?: A Little Help needed climbing 3-5 steps with a railing? : A Lot 6 Click Score: 15    End of Session Equipment Utilized During Treatment: Gait belt Activity Tolerance: Patient tolerated treatment well Patient left: in bed;with call bell/phone within reach;with bed alarm set Nurse Communication: Mobility status PT Visit Diagnosis: Unsteadiness on feet (R26.81);Muscle weakness (generalized) (M62.81);Difficulty in walking, not elsewhere classified (R26.2)     Time: 1859-0931 PT Time Calculation (min) (ACUTE ONLY): 33 min  Charges:  $Gait Training: 8-22 mins $Therapeutic Activity: 8-22 mins                     Mabeline Caras, PT, DPT Acute Rehabilitation Services  Pager [phone] Office [place] 11/01/2018, 3:18 PM

## 2018-11-01 NOTE — Clinical Social Work Placement (Signed)
   CLINICAL SOCIAL WORK PLACEMENT  NOTE  Date:  11/01/2018  Patient Details  Name: [name] MRN: [id number] Date of Birth: 1941-09-07  Clinical Social Work is seeking post-discharge placement for this patient at the [place] level of care (*CSW will initial, date and re-position this form in  chart as items are completed):  Yes   Patient/family provided with [facility] Work Department's list of facilities offering this level of care within the geographic area requested by the patient (or if unable, by the patient's family).  Yes   Patient/family informed of their freedom to choose among providers that offer the needed level of care, that participate in Medicare, Medicaid or managed care program needed by the patient, have an available bed and are willing to accept the patient.  Yes   Patient/family informed of [facility]'s ownership interest in [facility] and [facility], as well as of the fact that they are under no obligation to receive care at these facilities.  PASRR submitted to EDS on 11/01/18     PASRR number received on       Existing PASRR number confirmed on 11/01/18     FL2 transmitted to all facilities in geographic area requested by pt/family on 11/01/18     FL2 transmitted to all facilities within larger geographic area on       Patient informed that his/her managed care company has contracts with or will negotiate with certain facilities, including the following:            Patient/family informed of bed offers received.  Patient chooses bed at       Physician recommends and patient chooses bed at      Patient to be transferred to   on  .  Patient to be transferred to facility by       Patient family notified on   of transfer.  Name of family member notified:        PHYSICIAN Please sign FL2     Additional Comment:    _______________________________________________ Candie Chroman, LCSW 11/01/2018,  12:38 PM

## 2018-11-01 NOTE — NC FL2 (Signed)
[place] LEVEL OF CARE SCREENING TOOL     IDENTIFICATION  Patient Name: [name] Birthdate: 1941-11-15 Sex: male Admission Date (Current Location): 10/20/2018  [facility] and [place] Number:  Herbalist and Address:  [facility] [place], [place] [address] [place], [place] [address]      Provider Number: [id number]  Attending Physician Name and Address:  Bonnielee Haff, MD  Relative Name and Phone Number:  [name], [phone]    Current Level of Care: Hospital Recommended Level of Care: [place] Prior Approval Number:    Date Approved/Denied:   PASRR Number: [id number]  Discharge Plan: SNF    Current Diagnoses: Patient Active Problem List   Diagnosis Date Noted  . Sepsis ([place]) 10/21/2018  . Acute encephalopathy 10/21/2018  . Elevated alkaline phosphatase level 10/21/2018  . Chronic anemia 10/21/2018  . Thrombocytopenia ([place]) 10/21/2018  . Pulmonary HTN ([place]) 03/04/2017  . Aortic valve stenosis 03/04/2017  . Hypertension, accelerated with heart disease, without CHF 01/12/2017  . Dyspnea on exertion 01/12/2017  . End stage renal disease ([place]) 06/07/2012  . Hyperlipidemia 04/24/2011  . HYPERTENSION, BENIGN 10/24/2009  . Type 2 diabetes mellitus ([place]) 10/23/2009  . OBSTRUCTIVE SLEEP APNEA 10/23/2009  . PAF (paroxysmal atrial fibrillation) ([place]) 10/23/2009  . CHF (congestive heart failure) ([place]) 10/23/2009    Orientation RESPIRATION BLADDER Height & Weight     Self, Time, Situation, Place  Normal, Other (Comment)(CPAP at night.) Continent Weight: 154 lb 4.8 oz (70 kg)(scale C) Height:  5\' 6"  (167.6 cm)  BEHAVIORAL SYMPTOMS/MOOD NEUROLOGICAL BOWEL NUTRITION STATUS  (None) (None) Incontinent Diet(Renal with fluid restriction: 1200 mL.)  AMBULATORY STATUS COMMUNICATION OF NEEDS Skin   Extensive Assist Verbally Skin abrasions, Bruising, Other (Comment)(Excoriated, MASD. Diabetic ulcer on left small  toe: Foam. )                       Personal Care Assistance Level of Assistance  Bathing, Feeding, Dressing Bathing Assistance: Limited assistance Feeding assistance: Independent Dressing Assistance: Limited assistance     Functional Limitations Info  Sight, Hearing, Speech Sight Info: Adequate Hearing Info: Adequate Speech Info: Adequate    SPECIAL CARE FACTORS FREQUENCY  PT (By licensed PT), OT (By licensed OT)     PT Frequency: 5 x week OT Frequency: 5 x week            Contractures Contractures Info: Not present    Additional Factors Info  Code Status, Allergies Code Status Info: Full code Allergies Info: Tape           Current Medications (11/01/2018):  This is the current hospital active medication list Current Facility-Administered Medications  Medication Dose Route Frequency Provider Last Rate Last Dose  . 0.9 %  sodium chloride infusion   Intravenous PRN Elodia Florence., MD 10 mL/hr at 10/22/18 1033 250 mL at 10/22/18 1033  . acetaminophen (TYLENOL) tablet 650 mg  650 mg Oral Q6H PRN Shela Leff, MD   650 mg at 10/30/18 0942  . aspirin EC tablet 325 mg  325 mg Oral Daily Shela Leff, MD   325 mg at 11/01/18 0837  . calcium acetate (PHOSLO) capsule 2,668 mg  2,668 mg Oral TID WC Bonnielee Haff, MD   2,668 mg at 11/01/18 0837  . Chlorhexidine Gluconate Cloth 2 % PADS 6 each  6 each Topical Q0600 Justin Mend, MD      . collagenase (SANTYL) ointment  Topical Daily Elodia Florence., MD      . Gerhardt's butt cream   Topical QID Bonnielee Haff, MD      . hydrOXYzine (ATARAX/VISTARIL) tablet 25 mg  25 mg Oral Q8H PRN Shela Leff, MD   25 mg at 10/30/18 2227  . ipratropium-albuterol (DUONEB) 0.5-2.5 (3) MG/3ML nebulizer solution 3 mL  3 mL Nebulization Q6H PRN Shela Leff, MD   3 mL at 10/21/18 0803  . loperamide (IMODIUM) capsule 2 mg  2 mg Oral Q6H PRN Elodia Florence., MD   2 mg at 10/31/18 0559  .  metoprolol tartrate (LOPRESSOR) tablet 50 mg  50 mg Oral BID Bonnielee Haff, MD   50 mg at 11/01/18 0837  . ondansetron (ZOFRAN) injection 4 mg  4 mg Intravenous Q6H PRN Elodia Florence., MD   4 mg at 10/24/18 1822  . saccharomyces boulardii (FLORASTOR) capsule 250 mg  250 mg Oral BID Bonnielee Haff, MD   250 mg at 11/01/18 1638     Discharge Medications: Please see discharge summary for a list of discharge medications.  Relevant Imaging Results:  Relevant Lab Results:   Additional Information SS#: [id number]. HD MWF at [facility].  Candie Chroman, LCSW

## 2018-11-01 NOTE — Care Management Important Message (Signed)
Important Message  Patient Details  Name: [name] MRN: [id number] Date of Birth: June 13, 1941   Medicare Important Message Given:  Yes    Barb Merino Jathan Balling 11/01/2018, 3:28 PM

## 2018-11-01 NOTE — Discharge Summary (Signed)
[facility]  Physician Discharge Summary   Patient ID: [name] MRN: [id number] DOB/AGE: Jun 24, 1941 77 y.o.  Admit date: 10/20/2018 Discharge date: 11/01/2018  PCP: Unknown   DISCHARGE DIAGNOSES:  Acute respiratory failure with hypoxia due to volume overload versus pneumonia, resolved Community-acquired pneumonia, improved Sepsis, resolved Acute metabolic encephalopathy, resolved End-stage renal disease on hemodialysis Paroxysmal atrial fibrillation  RECOMMENDATIONS FOR OUTPATIENT FOLLOW UP: 1. Patient will continue with his usual hemodialysis schedule 2. Results of SPEP is pending.  Depending on these results he may need oncology for further evaluation due to possibility of a lytic lesion in the right femur head.  See discussion below. 3. Follow-up with his PCP within 1 week   DISCHARGE CONDITION: fair  Diet recommendation: Heart healthy  Filed Weights   10/31/18 0755 10/31/18 1214 11/01/18 0139  Weight: 71.7 kg 70 kg 70 kg    INITIAL HISTORY: 77 y.o.malewith medical history significant ofend-stage renal disease on hemodialysis Monday Wednesday Friday, A. fib, CHF, diabetes,hypertension, hyperlipidemia,OSA presented to the hospital from his nursing home for further evaluation of altered mental status. He presented with hypotension and AMS concerning for sepsis. He was started on broad spectrum antibiotics. He was noted to have an oxygen requirement and had a CT concerning for edema vs inflammation or atypical infection. He was started on coverage for CAP. The CT was also notable for a moderate R sided effusion. IR was consulted, but there was not enough to tap. He was also noted to have an elevated ammonia, but he's had intermittent diarrhea prior to presentation and worsening diarrhea since lactlose was started.   Consultants:Nephrology.  Procedures:Hemodialysis   HOSPITAL COURSE:   Acute hypoxic respiratory failure Patient's respiratory  status has improved.  He is saturating normal on room air.  Patient was treated for pneumonia with 7-day course of antibiotics.  There was also concern for pulmonary edema for which he received hemodialysis.  Chest x-ray shows improvement in interstitial edema.    Lytic lesions CT scan of the abdomen pelvis did suggest lytic lesions in the right femoral head as well as L3 vertebral body.  Patient without any known history of cancer.  These findings were discussed with the patient.  Bone scan was done which did not show any concerning lesions in the sites.  Discussed with the radiology.  Bone scan may not necessarily show lesions caused by multiple myeloma.  They recommended MRI for further evaluation.  MRI of the lumbar spine was done.  Image quality was poor as the patient was quite agitated.  However it appears that findings may be more suggestive of Schmorl's nodes rather than a lytic lesion.  MRI of the hip was attempted twice and he did not tolerate this despite sedation.  Discussed with interventional radiology.  It is very difficult to biopsy the site of pediatric lesion in the right femur head.  SPEP was ordered and the results are pending.  PSA noted to be 6.6 and not significantly elevated. The lesions are noted not suggestive of metastatic disease.  Plan will be to follow-up on the results of SPEP and perhaps consider referral to medical oncology if the results are abnormal.  Otherwise reattempt on the MRI can be done in few weeks.   Sepsis with elevated lactic acid level and hypotension Likely due to pneumonia.  Cultures negative.  WBC has been normal.  He has completed 7-day course of antibiotics.  Sepsis physiology has resolved.    Acute metabolic encephalopathy Alteration in mental status  most likely due to metabolic derangements as well as hypoxia.  Mental status now back to baseline.  There was also some concern for hyperammonemia.  He was given lactulose resulting in explosive diarrhea.   Normal B12 level noted.  LFTs have been normal.  No further work-up necessary at this time.    Elevated ammonia level Etiology unclear.  There was some concern for liver cirrhosis due to thrombocytopenia low albumin and elevated INR.  Lactulose currently on hold due to diarrhea.  CT scan of the abdomen did not show any abnormalities in the liver.  Nausea and vomiting The symptoms have resolved.    Diarrhea Most likely due to lactulose.  Appears to have improved.  CT scan did shows significant stool burden.  Is quite likely patient had been constipated for a while.  We will not give him any more Imodium.  Allow him to have normal bowel movements.  TSH normal  Elevated alkaline phosphatase Right upper quadrant ultrasound was unremarkable.  No abnormalities noted in the hepatobiliary system on Ct scan.  He is status post cholecystectomy.  Recommend outpatient monitoring.  Right-sided pleural effusion Thoracentesis was ordered however no significant fluid was noted when he went down for the procedure.  No significant effusion noted on chest x-ray from 11/1.  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  Underwent dialysis during the course of this hospital stay.  Did have some bleeding from his dialysis site on 11/1 but was controlled easily.  Mild drop in hemoglobin noted.  Hemoglobin remained stable.    Paroxysmal atrial fibrillation Mild RVR noted during earlier part of this hospitalization.  Metoprolol dose was initially increased to 100 mg twice daily.  Rate remains very well controlled.  Not on anticoagulation. As per cardiology note from 2018 he is not on warfarin due to history of GI bleed.  Dose of metoprolol was reduced due to borderline low blood pressures.  Chronic diastolic CHF Stable.  Volume being managed with hemodialysis.  Diabetes mellitus type 2 HbA1c 4.2.  Recommend stopping his Prandin.  Continue to monitor CBGs  periodically.  Essential hypertension Borderline low blood pressures noted but stable.  Asymptomatic.  Obstructive sleep apnea Continue CPAP  Anemia of chronic disease Stable.  Thrombocytopenia Stable.  No evidence of overt bleeding.  Patient remains stable and can be discharged today.  PT has recommended skilled nursing facility which the patient refuses.  Representative from his assisted living facility to evaluate patient to see if he is appropriate to return there.       PERTINENT LABS:  The results of significant diagnostics from this hospitalization (including imaging, microbiology, ancillary and laboratory) are listed below for reference.    Microbiology: Recent Results (from the past 240 hour(s))  Respiratory Panel by PCR     Status: None   Collection Time: 10/22/18  5:03 PM  Result Value Ref Range Status   Adenovirus NOT DETECTED NOT DETECTED Final   Coronavirus 229E NOT DETECTED NOT DETECTED Final   Coronavirus HKU1 NOT DETECTED NOT DETECTED Final   Coronavirus NL63 NOT DETECTED NOT DETECTED Final   Coronavirus OC43 NOT DETECTED NOT DETECTED Final   Metapneumovirus NOT DETECTED NOT DETECTED Final   Rhinovirus / Enterovirus NOT DETECTED NOT DETECTED Final   Influenza A NOT DETECTED NOT DETECTED Final   Influenza B NOT DETECTED NOT DETECTED Final   Parainfluenza Virus 1 NOT DETECTED NOT DETECTED Final   Parainfluenza Virus 2 NOT DETECTED NOT DETECTED  Final   Parainfluenza Virus 3 NOT DETECTED NOT DETECTED Final   Parainfluenza Virus 4 NOT DETECTED NOT DETECTED Final   Respiratory Syncytial Virus NOT DETECTED NOT DETECTED Final   Bordetella pertussis NOT DETECTED NOT DETECTED Final   Chlamydophila pneumoniae NOT DETECTED NOT DETECTED Final   Mycoplasma pneumoniae NOT DETECTED NOT DETECTED Final    Comment: Performed at [facility], [place] [address], [place], [place] [address]     Labs: Basic Metabolic Panel: Recent Labs  Lab 10/26/18 0538  10/26/18 1954 10/28/18 0542 10/29/18 0106 10/31/18 0651 11/01/18 0732  NA 143 138 136 134* 138 136  K 4.2 5.1 3.9 4.7 6.0* 5.0  CL 102 97* 100 98 102 99  CO2 '26 29 28 27 23 26  '$ GLUCOSE 90 134* 83 115* 96 105*  BUN 48* 57* 24* 41* 61* 32*  CREATININE 6.97* 7.79* 4.77* 6.05* 8.53* 6.00*  CALCIUM 9.2 9.1 8.6* 9.1 9.3 9.2  MG 2.3  --   --   --   --   --   PHOS  --  5.5*  --  4.5 4.9*  --    Liver Function Tests: Recent Labs  Lab 10/26/18 0538 10/26/18 1954 10/29/18 0106 10/31/18 0651  AST 24  --   --   --   ALT 22  --   --   --   ALKPHOS 170*  --   --   --   BILITOT 1.1  --   --   --   PROT 6.8  --   --   --   ALBUMIN 3.0* 2.8* 2.9* 3.0*    Recent Labs  Lab 10/26/18 0538  AMMONIA 42*   CBC: Recent Labs  Lab 10/28/18 0542 10/29/18 0106 10/29/18 0431 10/30/18 1111 10/31/18 0651  WBC 7.1 8.6 7.8 7.3 7.7  HGB 9.8* 10.4* 9.4* 8.7* 9.5*  HCT 32.0* 32.7* 31.5* 27.8* 30.8*  MCV 104.2* 104.1* 105.0* 104.9* 105.1*  PLT 116* 126* 125* 111* 127*    CBG: Recent Labs  Lab 10/30/18 2134 10/31/18 0644 10/31/18 1249 10/31/18 2320 11/01/18 0750  GLUCAP 122* 93 76 108* 85     IMAGING STUDIES Ct Abdomen Pelvis Wo Contrast  Result Date: 10/27/2018 CLINICAL DATA:  n /v, pt denies abd pain history of chronic kidney disease. EXAM: CT ABDOMEN AND PELVIS WITHOUT CONTRAST TECHNIQUE: Multidetector CT imaging of the abdomen and pelvis was performed following the standard protocol without IV contrast. COMPARISON:  09/25/2008 FINDINGS: Lower chest: There is a RIGHT pleural effusion. Ground-glass opacities are identified in the central perihilar regions bilaterally, raising the question of pulmonary edema. There is dense coronary artery calcification. Heart size is normal. Hepatobiliary: Status post cholecystectomy. Liver is homogeneous without focal mass appear Pancreas: Unremarkable. No pancreatic ductal dilatation or surrounding inflammatory changes. Spleen: Normal in size without  focal abnormality. Adrenals/Urinary Tract: There is prominence of the adrenal glands bilaterally, not associated with discrete mass. The kidneys are small bilaterally. There is atherosclerotic calcification of the renal arteries. A low-attenuation lesion is identified in the LOWER pole the LEFT kidney, too small to fully characterize. There is no hydronephrosis. Ureters are unremarkable. The bladder and visualized portion of the urethra are normal. Stomach/Bowel: The stomach and small bowel loops are normal in appearance. The appendix is well seen and has a normal appearance. There are scattered colonic diverticula but no acute diverticulitis. Significant stool burden. Vascular/Lymphatic: There is dense atherosclerotic calcification of the abdominal aorta. No retroperitoneal or mesenteric adenopathy. Reproductive:  The prostate is enlarged, up lifting the urinary bladder. Other: No free pelvic fluid. Anterior abdominal wall is notable for a small fat containing paraumbilical hernia. There is mild diffuse body wall edema. Musculoskeletal: There is a new 2.1 centimeter lytic lesion within the MEDIAL aspect of the femoral head, adjacent to the fovea. This lesion does not abut a weight-bearing articular surface and there are no significant degenerative changes in the hip. There is a 1.5 centimeter lytic lesion with than the superior aspect of L3. Lesions at the disc space of L3-4 and L4-5 are favored to represent Schmorl's nodes and are associated with degenerative changes. IMPRESSION: 1. RIGHT pleural effusion and ground-glass opacities in the lung bases consistent with pulmonary edema. 2. There are new osseous lytic lesions, 1 involving the RIGHT femoral head and a second involving the superior aspect of L3 vertebral body. These are suspicious for metastatic disease given their appearance. Consider bone scan for further evaluation. 3. Small kidneys, consistent with the history of chronic renal disease. 4. Colonic  diverticulosis without acute diverticulitis. Significant stool burden. 5. Cholecystectomy. 6.  Aortic atherosclerosis.  (ICD10-I70.0) 7. Prostatic enlargement. 8. Small fat containing paraumbilical hernia. 9. Mild anasarca. Electronically Signed   By: Nolon Nations M.D.   On: 10/27/2018 12:38   Dg Chest 1 View  Result Date: 10/22/2018 CLINICAL DATA:  77 year old male admitted with altered mental status, hypotension. In stage renal disease on dialysis. EXAM: CHEST  1 VIEW COMPARISON:  10/20/2018 and earlier. FINDINGS: AP view at 0734 hours. Mildly lower lung volumes. Stable cardiac size and mediastinal contours. Calcified aortic atherosclerosis. Visualized tracheal air column is within normal limits. Continued mild diffuse increased interstitial opacity. No pneumothorax, pleural effusion or confluent opacity. Negative visible bowel gas. IMPRESSION: Continued mild nonspecific bilateral pulmonary interstitial opacity, might reflect crowding of lung markings. Pulmonary interstitial edema and viral/atypical respiratory infection are difficult to exclude. Electronically Signed   By: Genevie Ann M.D.   On: 10/22/2018 09:28   Dg Chest 2 View  Result Date: 10/28/2018 CLINICAL DATA:  Shortness of breath and cough EXAM: CHEST - 2 VIEW COMPARISON:  October 22, 2018 chest radiograph and chest CT October 23, 2018 FINDINGS: There has been resolution of interstitial edema. The pleural effusion on the right seen on recent CT is not convincingly seen on this study. There is currently no appreciable edema or consolidation. There is mild cardiomegaly with pulmonary venous hypertension. There is aortic atherosclerosis. No adenopathy. No bone lesions. IMPRESSION: Pulmonary vascular congestion without frank edema or consolidation. There is aortic atherosclerosis. Aortic Atherosclerosis (ICD10-I70.0). Electronically Signed   By: Lowella Grip III M.D.   On: 10/28/2018 09:16   Dg Chest 2 View  Result Date:  10/13/2018 CLINICAL DATA:  Fall. EXAM: CHEST - 2 VIEW COMPARISON:  June 16, 2012 FINDINGS: Mild cardiomegaly. The hila and mediastinum are unchanged since 2013. No nodules or masses. No focal infiltrates. Mild pulmonary venous congestion not excluded. No overt edema. IMPRESSION: Cardiomegaly. Possible mild pulmonary venous congestion without overt edema. Recommend clinical correlation. Electronically Signed   By: Dorise Bullion III M.D   On: 10/13/2018 08:02   Ct Head Wo Contrast  Result Date: 10/20/2018 CLINICAL DATA:  Altered LOC EXAM: CT HEAD WITHOUT CONTRAST TECHNIQUE: Contiguous axial images were obtained from the base of the skull through the vertex without intravenous contrast. COMPARISON:  10/13/2018 FINDINGS: Brain: No acute hemorrhage or intracranial mass. 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 Old lacunar infarct left basal ganglia. Moderate-to-marked atrophy. Moderate small vessel  ischemic changes of the white matter. Stable enlarged ventricles. Vascular: No hyperdense vessels.  Carotid vascular calcification Skull: Normal. Negative for fracture or focal lesion. Sinuses/Orbits: Mild mucosal thickening in the sinuses. No acute orbital abnormality. Other: None IMPRESSION: 1. No CT evidence for acute intracranial abnormality. 2. Atrophy and small-vessel ischemic changes of the white matter. 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 Electronically Signed   By: Donavan Foil M.D.   On: 10/20/2018 18:49   Ct Head Wo Contrast  Result Date: 10/13/2018 CLINICAL DATA:  Fall.  Altered mental status. EXAM: CT HEAD WITHOUT CONTRAST TECHNIQUE: Contiguous axial images were obtained from the base of the skull through the vertex without intravenous contrast. COMPARISON:  01/02/2009 FINDINGS: Brain: Left posterior parietal infarct, age indeterminate. There is atrophy and chronic small vessel disease changes. No hydrocephalus or hemorrhage. Vascular: No hyperdense vessel or unexpected calcification. Skull: No  acute calvarial abnormality. Sinuses/Orbits: Mucosal thickening in the paranasal sinuses. No air-fluid levels. Mastoids clear. Other: None IMPRESSION: Age-indeterminate left posterior parietal infarct. Atrophy, chronic small vessel disease. Electronically Signed   By: Rolm Baptise M.D.   On: 10/13/2018 08:17   Ct Chest Wo Contrast  Result Date: 10/23/2018 CLINICAL DATA:  Acute respiratory illness. EXAM: CT CHEST WITHOUT CONTRAST TECHNIQUE: Multidetector CT imaging of the chest was performed following the standard protocol without IV contrast. COMPARISON:  Chest radiograph, 10/22/2018 and older exams. FINDINGS: Cardiovascular: Heart is normal in size. There are dense three-vessel coronary artery calcifications. No pericardial effusion. Great vessels are normal in caliber. Aortic atherosclerosis. Mediastinum/Nodes: No neck base or axillary masses or enlarged lymph nodes. Scattered prominent mediastinal lymph nodes, none pathologically enlarged. No mediastinal or hilar masses. Trachea is patent. Esophagus is unremarkable. Lungs/Pleura: Moderate right pleural effusion. No left pleural effusion. Lungs show upper lobe predominant interstitial thickening with intervening ground-glass opacities. No lung mass or suspicious nodule. Several small calcified granuloma. No pneumothorax. Upper Abdomen: No acute finding. Musculoskeletal: No fracture or acute finding. No osteoblastic or osteolytic lesions. Chest wall: Prominent bilateral gynecomastia. IMPRESSION: 1. Lungs demonstrate upper lobe predominant interstitial thickening with intervening ground-glass opacities. Differential diagnosis includes pulmonary edema as well as inflammation or atypical infection. 2. Moderate right pleural effusion. 3. Dense coronary artery calcifications.  Aortic atherosclerosis. Aortic Atherosclerosis (ICD10-I70.0). Electronically Signed   By: Lajean Manes M.D.   On: 10/23/2018 17:23   Mr Lumbar Spine Wo Contrast  Result Date:  10/30/2018 CLINICAL DATA:  Abnormal CT findings in lumbar spine. Negative bone scan. EXAM: MRI LUMBAR SPINE WITHOUT CONTRAST TECHNIQUE: Multiplanar, multisequence MR imaging of the lumbar spine was performed. No intravenous contrast was administered. COMPARISON:  Bone scan 10/28/2018.  CT abdomen pelvis 10/27/2018 FINDINGS: The patient was not able to complete the study. Limited imaging was obtained which is significantly degraded by motion. Sagittal T2 and STIR images only were obtained. Normal alignment. No fracture. Multilevel disc degeneration. Review of the prior CT reveals lytic lesion superior L3 vertebral body which appears to communicate with the L2-3 disc space. Lytic lesion inferior endplate of L3 also communicates with the L3-4 disc space. Similar lesion inferior endplate of L4 communicates with the L4-5 disc space. 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 They are identified on the MRI however there is limited detail. IMPRESSION: Limited incomplete lumbar MRI. Extensive motion artifact. The patient was not complete the study. 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 Electronically Signed   By: Franchot Gallo M.D.  On: 10/30/2018 13:23   Nm Bone Scan Whole Body  Result Date: 10/28/2018 CLINICAL DATA:  New L3 and right femoral head lytic lesions. EXAM: NUCLEAR MEDICINE WHOLE BODY BONE SCAN TECHNIQUE: Whole body anterior and posterior images were obtained approximately 3 hours after intravenous injection of radiopharmaceutical. RADIOPHARMACEUTICALS:  20.3 mCi Technetium-28mMDP IV COMPARISON:  CT abdomen pelvis dated October 27, 2018. FINDINGS: There are no foci of increased or decreased radiotracer uptake to suggest osseous metastatic disease. There is degenerative type uptake in the bilateral shoulders. Normal physiologic activity is identified within the kidneys and urinary bladder. IMPRESSION: Negative study. Specifically, no abnormal  uptake in the lumbar spine or right femoral head. Electronically Signed   By: WTitus DubinM.D.   On: 10/28/2018 15:06   UKoreaAbdomen Complete  Result Date: 10/21/2018 CLINICAL DATA:  Abdominal pain EXAM: ABDOMEN ULTRASOUND COMPLETE COMPARISON:  None. FINDINGS: Gallbladder: Surgically absent Common bile duct: Diameter: 7 mm-upper normal for age. No evident filling defect Liver: No focal lesion identified. Within normal limits in parenchymal echogenicity. Portal vein is patent on color Doppler imaging with normal direction of blood flow towards the liver. IVC: No abnormality visualized. Pancreas: Visualized portion unremarkable. Spleen: Size and appearance within normal limits. Right Kidney: Known end-stage renal disease. Length: 8 cm. Increased echogenicity. No hydronephrosis or mass Left Kidney: Length: 7 cm. There is increased echogenicity. No hydronephrosis or mass. Abdominal aorta: No aneurysm visualized. The distal aorta is not visible due to bowel gas. Other findings: A right pleural effusion is partially visualized. IMPRESSION: 1. No acute intra-abdominal finding. 2. Right pleural effusion. Electronically Signed   By: JMonte FantasiaM.D.   On: 10/21/2018 18:15   Dg Chest Port 1 View  Result Date: 10/20/2018 CLINICAL DATA:  Altered mental status and hypotension. EXAM: PORTABLE CHEST 1 VIEW COMPARISON:  10/13/2018 and prior radiographs FINDINGS: UPPER limits normal heart size noted. Mild interstitial prominence again noted. No airspace disease, pleural effusion or pneumothorax. No acute bony abnormality identified. IMPRESSION: UPPER limits normal heart size with unchanged interstitial prominence. Electronically Signed   By: JMargarette CanadaM.D.   On: 10/20/2018 16:42   Dg Toe 5th Left  Result Date: 10/24/2018 CLINICAL DATA:  Open wound on toe.  Diabetic. EXAM: DG TOE 5TH LEFT COMPARISON:  None. FINDINGS: Mild irregularity of the base of the fifth proximal phalanx. No bone destruction to suggest  osteomyelitis. No definite fracture identified. No air within the soft tissues. IMPRESSION: No evidence of fracture or osteomyelitis. Electronically Signed   By: DMarin OlpM.D.   On: 10/24/2018 19:25   Ir UKoreaChest  Result Date: 10/26/2018 CLINICAL DATA:  77year old male with a history of possible pleural fluid. EXAM: CHEST ULTRASOUND COMPARISON:  None. FINDINGS: Sonographic survey of the chest demonstrates no significant fluid. Thoracentesis not performed. IMPRESSION: Sonographic survey demonstrates no significant right pleural fluid. Electronically Signed   By: JCorrie MckusickD.O.   On: 10/26/2018 11:14    DISCHARGE EXAMINATION: Vitals:   10/31/18 2324 11/01/18 0025 11/01/18 0139 11/01/18 0636  BP: 100/70   97/62  Pulse: 78 72  79  Resp: '18 20  18  '$ Temp: 98.6 F (37 C)   98.3 F (36.8 C)  TempSrc: Oral   Oral  SpO2: 93% 99%  97%  Weight:   70 kg   Height:       General appearance: alert, cooperative, appears stated age and no distress Resp: clear to auscultation bilaterally Cardio: regular rate and rhythm, S1, S2  normal, no murmur, click, rub or gallop GI: soft, non-tender; bowel sounds normal; no masses,  no organomegaly  DISPOSITION: Assisted living facility versus SNF  Discharge Instructions    Call MD for:  difficulty breathing, headache or visual disturbances   Complete by:  As directed    Call MD for:  extreme fatigue   Complete by:  As directed    Call MD for:  persistant dizziness or light-headedness   Complete by:  As directed    Call MD for:  persistant nausea and vomiting   Complete by:  As directed    Call MD for:  severe uncontrolled pain   Complete by:  As directed    Call MD for:  temperature >100.4   Complete by:  As directed    Diet - low sodium heart healthy   Complete by:  As directed    Discharge instructions   Complete by:  As directed    Please review instructions on the discharge summary.  You were cared for by a hospitalist during your  hospital stay. If you have any questions about your discharge medications or the care you received while you were in the hospital after you are discharged, you can call the unit and asked to speak with the hospitalist on call if the hospitalist that took care of you is not available. Once you are discharged, your primary care physician will handle any further medical issues. Please note that NO REFILLS for any discharge medications will be authorized once you are discharged, as it is imperative that you return to your primary care physician (or establish a relationship with a primary care physician if you do not have one) for your aftercare needs so that they can reassess your need for medications and monitor your lab values. If you do not have a primary care physician, you can call [phone] for a physician referral.   Increase activity slowly   Complete by:  As directed         Allergies as of 11/01/2018      Reactions   Tape Itching, Other (See Comments)   Cloth tape only      Medication List    STOP taking these medications   amLODipine 5 MG tablet Commonly known as:  NORVASC   atorvastatin 40 MG tablet Commonly known as:  LIPITOR   losartan 100 MG tablet Commonly known as:  COZAAR   metoprolol succinate 100 MG 24 hr tablet Commonly known as:  TOPROL-XL   metoprolol succinate 50 MG 24 hr tablet Commonly known as:  TOPROL-XL   repaglinide 0.5 MG tablet Commonly known as:  PRANDIN     TAKE these medications   acetaminophen 500 MG tablet Commonly known as:  TYLENOL Take 1,000 mg by mouth 3 (three) times daily.   aspirin EC 325 MG tablet Take 1 tablet (325 mg total) by mouth daily.   calcium acetate 667 MG capsule Commonly known as:  PHOSLO Take 2,668 mg by mouth 3 (three) times daily with meals.   hydrOXYzine 25 MG tablet Commonly known as:  ATARAX/VISTARIL Take 25 mg by mouth every 8 (eight) hours as needed for itching.   metoprolol tartrate 50 MG tablet Commonly  known as:  LOPRESSOR Take 1 tablet (50 mg total) by mouth 2 (two) times daily.          TOTAL DISCHARGE TIME: 35 minutes  Bonnielee Haff  [facility] Pager [phone]  11/01/2018, 11:13 AM

## 2018-11-02 LAB — RENAL FUNCTION PANEL
Albumin: 2.8 g/dL — ABNORMAL LOW (ref 3.5–5.0)
Anion gap: 13 (ref 5–15)
BUN: 54 mg/dL — ABNORMAL HIGH (ref 8–23)
CALCIUM: 9.1 mg/dL (ref 8.9–10.3)
CO2: 24 mmol/L (ref 22–32)
Chloride: 100 mmol/L (ref 98–111)
Creatinine, Ser: 7.72 mg/dL — ABNORMAL HIGH (ref 0.61–1.24)
GFR, EST AFRICAN AMERICAN: 7 mL/min — AB (ref >60)
GFR, EST NON AFRICAN AMERICAN: 6 mL/min — AB (ref >60)
Glucose, Bld: 80 mg/dL (ref 70–99)
Phosphorus: 3.9 mg/dL (ref 2.5–4.6)
Potassium: 5 mmol/L (ref 3.5–5.1)
SODIUM: 137 mmol/L (ref 135–145)

## 2018-11-02 LAB — CBC
HCT: 28.3 % — ABNORMAL LOW (ref 39.0–52.0)
Hemoglobin: 8.7 g/dL — ABNORMAL LOW (ref 13.0–17.0)
MCH: 32.2 pg (ref 26.0–34.0)
MCHC: 30.7 g/dL (ref 30.0–36.0)
MCV: 104.8 fL — AB (ref 80.0–100.0)
NRBC: 0 % (ref 0.0–0.2)
Platelets: 144 10*3/uL — ABNORMAL LOW (ref 150–400)
RBC: 2.7 MIL/uL — AB (ref 4.22–5.81)
RDW: 18 % — AB (ref 11.5–15.5)
WBC: 7.7 10*3/uL (ref 4.0–10.5)

## 2018-11-02 LAB — GLUCOSE, CAPILLARY
GLUCOSE-CAPILLARY: 107 mg/dL — AB (ref 70–99)
GLUCOSE-CAPILLARY: 102 mg/dL — AB (ref 70–99)
Glucose-Capillary: 92 mg/dL (ref 70–99)

## 2018-11-02 MED ORDER — LIDOCAINE-PRILOCAINE 2.5-2.5 % EX CREA: 1.0000 "application " | TOPICAL_CREAM | CUTANEOUS | Status: DC | PRN

## 2018-11-02 MED ORDER — SODIUM CHLORIDE 0.9 % IV SOLN: 100.0000 mL | INTRAVENOUS | Status: DC | PRN

## 2018-11-02 MED ORDER — PENTAFLUOROPROP-TETRAFLUOROETH EX AERO: 1.0000 "application " | INHALATION_SPRAY | CUTANEOUS | Status: DC | PRN

## 2018-11-02 MED ORDER — LIDOCAINE HCL (PF) 1 % IJ SOLN: 5.0000 mL | INTRAMUSCULAR | Status: DC | PRN

## 2018-11-02 MED ORDER — HEPARIN SODIUM (PORCINE) 1000 UNIT/ML DIALYSIS: 2500.0000 [IU] | Freq: Once | INTRAMUSCULAR | Status: DC

## 2018-11-02 MED ORDER — ALTEPLASE 2 MG IJ SOLR: 2.0000 mg | Freq: Once | INTRAMUSCULAR | Status: DC | PRN

## 2018-11-02 MED ORDER — HEPARIN SODIUM (PORCINE) 1000 UNIT/ML DIALYSIS: 1000.0000 [IU] | INTRAMUSCULAR | Status: DC | PRN

## 2018-11-02 NOTE — Progress Notes (Signed)
Called report to [facility], all questions entertained and answered, pt assisted to stretcher and sent via [facility] transport in stable condition, pt denies pain, information packet sent with [facility] attendant.

## 2018-11-02 NOTE — Clinical Social Work Note (Addendum)
Left voicemail for patient's daughter. If no call back by noon, will tell patient he has to make facility choice.  Dayton Scrape, [place]  12:02 pm Tried calling daughter on home and mobile numbers. Did not leave voicemails.  Dayton Scrape, [place]  12:34 pm Received call back from patient's daughter. She has chosen [facility]. CSW tried calling admissions coordinator. Left a message to see if they had any male beds today.  Dayton Scrape, [place] [phone]  12:50 pm Spoke with Caryl Pina at [facility]. They have a bed available for patient today. She will call daughter to start paperwork and fax to her at her work to complete.   Dayton Scrape, [place]

## 2018-11-02 NOTE — Progress Notes (Signed)
Called [name] daughter to advise ambulance transport will not take flowers with them. Secretary Alinda Sierras has at desk and she said she will come pick them up

## 2018-11-02 NOTE — Plan of Care (Signed)

## 2018-11-02 NOTE — Clinical Social Work Note (Signed)
CSW facilitated patient discharge including contacting patient family and facility to confirm patient discharge plans. Clinical information faxed to facility and family agreeable with plan. CSW arranged ambulance transport via [facility] to [facility]. RN to call report prior to discharge ([phone]).  CSW will sign off for now as social work intervention is no longer needed. Please consult us again if new needs arise.  Tupac Jeffus, CSW [phone]  

## 2018-11-02 NOTE — Clinical Social Work Placement (Signed)
   CLINICAL SOCIAL WORK PLACEMENT  NOTE  Date:  11/02/2018  Patient Details  Name: [name] MRN: [id number] Date of Birth: February 13, 1941  Clinical Social Work is seeking post-discharge placement for this patient at the [place] level of care (*CSW will initial, date and re-position this form in  chart as items are completed):  Yes   Patient/family provided with [place] Work Department's list of facilities offering this level of care within the geographic area requested by the patient (or if unable, by the patient's family).  Yes   Patient/family informed of their freedom to choose among providers that offer the needed level of care, that participate in Medicare, Medicaid or managed care program needed by the patient, have an available bed and are willing to accept the patient.  Yes   Patient/family informed of [facility]'s ownership interest in [facility] and [facility], as well as of the fact that they are under no obligation to receive care at these facilities.  PASRR submitted to EDS on 11/01/18     PASRR number received on       Existing PASRR number confirmed on 11/01/18     FL2 transmitted to all facilities in geographic area requested by pt/family on 11/01/18     FL2 transmitted to all facilities within larger geographic area on       Patient informed that his/her managed care company has contracts with or will negotiate with certain facilities, including the following:        Yes   Patient/family informed of bed offers received.  Patient chooses bed at [facility]     Physician recommends and patient chooses bed at      Patient to be transferred to [facility] on 11/02/18.  Patient to be transferred to facility by [facility]     Patient family notified on 11/02/18 of transfer.  Name of family member notified:  [facility]     PHYSICIAN       Additional Comment:     _______________________________________________ Candie Chroman, LCSW 11/02/2018, 12:53 PM

## 2018-11-02 NOTE — Progress Notes (Signed)
[facility] ROUNDING NOTE   Subjective:   Seen on HD soon.   Didn't tolerate MRI hip despite sedation and multiple attempts.    Objective:  Vital signs in last 24 hours:  Temp:  [97.9 F (36.6 C)-98.4 F (36.9 C)] 98 F (36.7 C) (11/06 0845) Pulse Rate:  [71-90] 82 (11/06 1000) Resp:  [18] 18 (11/06 0845) BP: (79-115)/(47-61) 104/54 (11/06 1000) SpO2:  [83 %-100 %] 95 % (11/06 0845) Weight:  [71.4 kg] 71.4 kg (11/06 0541)  Weight change: -0.349 kg Filed Weights   10/31/18 1214 11/01/18 0139 11/02/18 0541  Weight: 70 kg 70 kg 71.4 kg    Intake/Output: I/O last 3 completed shifts: In: 1200 [P.O.:1200] Out: 3 [Stool:3]   Intake/Output this shift:  No intake/output data recorded. Gen - chronically ill appearing, lying calm in bed CVS-reg rate, Syst murmur, no rub RS- CTA slightly diminished at base ABD- BS present soft non-distended EXT- no edema left upper extremity AV fistula with thrill and bruit with HD needles in   Basic Metabolic Panel: Recent Labs  Lab 10/26/18 1954 10/28/18 0542 10/29/18 0106 10/31/18 0651 11/01/18 0732  NA 138 136 134* 138 136  K 5.1 3.9 4.7 6.0* 5.0  CL 97* 100 98 102 99  CO2 29 28 27 23 26   GLUCOSE 134* 83 115* 96 105*  BUN 57* 24* 41* 61* 32*  CREATININE 7.79* 4.77* 6.05* 8.53* 6.00*  CALCIUM 9.1 8.6* 9.1 9.3 9.2  PHOS 5.5*  --  4.5 4.9*  --     Liver Function Tests: Recent Labs  Lab 10/26/18 1954 10/29/18 0106 10/31/18 0651  ALBUMIN 2.8* 2.9* 3.0*   No results for input(s): LIPASE, AMYLASE in the last 168 hours. No results for input(s): AMMONIA in the last 168 hours.  CBC: Recent Labs  Lab 10/28/18 0542 10/29/18 0106 10/29/18 0431 10/30/18 1111 10/31/18 0651  WBC 7.1 8.6 7.8 7.3 7.7  HGB 9.8* 10.4* 9.4* 8.7* 9.5*  HCT 32.0* 32.7* 31.5* 27.8* 30.8*  MCV 104.2* 104.1* 105.0* 104.9* 105.1*  PLT 116* 126* 125* 111* 127*    Cardiac Enzymes: No results for input(s): CKTOTAL, CKMB, CKMBINDEX,  TROPONINI in the last 168 hours.  BNP: Invalid input(s): POCBNP  CBG: Recent Labs  Lab 11/01/18 0750 11/01/18 1154 11/01/18 1635 11/01/18 2129 11/02/18 0758  GLUCAP 85 103* 100* 108* 107*    Microbiology: Results for orders placed or performed during the hospital encounter of 10/20/18  Blood Culture (routine x 2)     Status: None   Collection Time: 10/20/18  5:10 PM  Result Value Ref Range Status   Specimen Description BLOOD RIGHT WRIST  Final   Special Requests   Final    BOTTLES DRAWN AEROBIC AND ANAEROBIC Blood Culture adequate volume   Culture   Final    NO GROWTH 5 DAYS Performed at [facility], [place] [address] [place], [place] [address]    Report Status 10/25/2018 FINAL  Final  Blood Culture (routine x 2)     Status: None   Collection Time: 10/20/18  5:10 PM  Result Value Ref Range Status   Specimen Description BLOOD RIGHT HAND  Final   Special Requests   Final    BOTTLES DRAWN AEROBIC AND ANAEROBIC Blood Culture results may not be optimal due to an inadequate volume of blood received in culture bottles   Culture   Final    NO GROWTH 5 DAYS Performed at [facility], [place]  [address], [place], [place] [address]    Report Status 10/25/2018 FINAL  Final  MRSA PCR Screening     Status: None   Collection Time: 10/21/18  3:49 AM  Result Value Ref Range Status   MRSA by PCR NEGATIVE NEGATIVE Final    Comment:        The GeneXpert MRSA Assay (FDA approved for NASAL specimens only), is one component of a comprehensive MRSA colonization surveillance program. It is not intended to diagnose MRSA infection nor to guide or monitor treatment for MRSA infections. Performed at [facility], [place] [address], [place], [place] [address]   Respiratory Panel by PCR     Status: None   Collection Time: 10/22/18  5:03 PM  Result Value Ref Range Status   Adenovirus NOT DETECTED NOT DETECTED Final   Coronavirus 229E NOT DETECTED NOT DETECTED Final    Coronavirus HKU1 NOT DETECTED NOT DETECTED Final   Coronavirus NL63 NOT DETECTED NOT DETECTED Final   Coronavirus OC43 NOT DETECTED NOT DETECTED Final   Metapneumovirus NOT DETECTED NOT DETECTED Final   Rhinovirus / Enterovirus NOT DETECTED NOT DETECTED Final   Influenza A NOT DETECTED NOT DETECTED Final   Influenza B NOT DETECTED NOT DETECTED Final   Parainfluenza Virus 1 NOT DETECTED NOT DETECTED Final   Parainfluenza Virus 2 NOT DETECTED NOT DETECTED Final   Parainfluenza Virus 3 NOT DETECTED NOT DETECTED Final   Parainfluenza Virus 4 NOT DETECTED NOT DETECTED Final   Respiratory Syncytial Virus NOT DETECTED NOT DETECTED Final   Bordetella pertussis NOT DETECTED NOT DETECTED Final   Chlamydophila pneumoniae NOT DETECTED NOT DETECTED Final   Mycoplasma pneumoniae NOT DETECTED NOT DETECTED Final    Comment: Performed at [facility], [place] [address] [place], [place] [address]    Coagulation Studies: No results for input(s): LABPROT, INR in the last 72 hours.  Urinalysis: No results for input(s): COLORURINE, LABSPEC, PHURINE, GLUCOSEU, HGBUR, BILIRUBINUR, KETONESUR, PROTEINUR, UROBILINOGEN, NITRITE, LEUKOCYTESUR in the last 72 hours.  Invalid input(s): APPERANCEUR    Imaging: No results found.   Medications:   . sodium chloride 250 mL (10/22/18 1033)  . sodium chloride    . sodium chloride     . aspirin EC  325 mg Oral Daily  . calcium acetate  2,668 mg Oral TID WC  . Chlorhexidine Gluconate Cloth  6 each Topical Q0600  . collagenase   Topical Daily  . Gerhardt's butt cream   Topical QID  . heparin  2,500 Units Dialysis Once in dialysis  . metoprolol tartrate  50 mg Oral BID  . saccharomyces boulardii  250 mg Oral BID   sodium chloride, sodium chloride, sodium chloride, acetaminophen, alteplase, heparin, hydrOXYzine, ipratropium-albuterol, lidocaine (PF), lidocaine-prilocaine, loperamide, ondansetron (ZOFRAN) IV, pentafluoroprop-tetrafluoroeth   MWF- AF  4hrs,  BFR 400, DFR AF 1.5,  EDW 73kg, 2K/ 2.25Ca, Linear Na  Access: LU AVF  Heparin 2500 unit bolus Mircera 100 mcg q2wks - last 10/21 Hectorol 42mcg IV qHD   Venofer 50mg  IV qwk  Assessment/ Plan:  1. AMS -  Multifactorial and resolved back to baseline per report.  .  2. Possible lytic lesions in spine and femoral heads - L spine MRI with motion artifact but thought to be nonmalignant lesions.  MRI hip ordered but he couldn't tolerate it.  Discussed with hospitalist yesterday.  SPEP has returned normal at this time.  PSA 6.6 - not thought to be prostate ca.  Doubtful that he would be able  to tolerate MRI outpatient.   3. ESRD - On HD MWF. HD today. UF 2.7L -- will need new EDW on discharge 4. Hypertension/volume - BP improved and now on low side but stable. On metoprolol for A fib.  EDW is 73kg, wt this AM 71.4 kg, yesterday 70kg.    5. Anemia of CKD -hemoglobin stable at 9.5 11/4. Mircera 100 q2wks - last 10/21. Rec'd aranesp 75mcg 11/4.  6. Secondary Hyperparathyroidism - Ca and phos at goal. Continue VDRA and binders. 7. Nutrition - Renal diet with fluid restrictions. Nepro.  8. DMT2 - per primary 9. Diastolic dysfunction/aortic stenosis - managing vol with HD 10. OSA 11. A fib - not on anticoagulation, rate controlled 12. Hx GIB 2/2 coumadin - not a current issue.   LOS: 12 Justin Mend @TODAY @10 :26 AM

## 2018-11-02 NOTE — Progress Notes (Signed)
Patient ID: [name], male   DOB: [birth year], 77 y.o.   MRN: [id number] Patient was supposed to be discharged on 11/01/2018 but patient/family has not decided on nursing home yet.  Patient seen and examined at bedside undergoing dialysis.  Patient is stable for discharge.  Please refer the full discharge summary done on 11/01/2018 by Dr. Bonnielee Haff for full details.

## 2018-11-03 ENCOUNTER — Non-Acute Institutional Stay (SKILLED_NURSING_FACILITY): Payer: Medicare Other | Admitting: Internal Medicine

## 2018-11-03 ENCOUNTER — Encounter: Payer: Self-pay | Admitting: Internal Medicine

## 2018-11-03 DIAGNOSIS — I4891 Unspecified atrial fibrillation: Secondary | ICD-10-CM

## 2018-11-03 DIAGNOSIS — Z992 Dependence on renal dialysis: Secondary | ICD-10-CM

## 2018-11-03 DIAGNOSIS — J9601 Acute respiratory failure with hypoxia: Secondary | ICD-10-CM | POA: Diagnosis not present

## 2018-11-03 DIAGNOSIS — N186 End stage renal disease: Secondary | ICD-10-CM

## 2018-11-03 DIAGNOSIS — G4733 Obstructive sleep apnea (adult) (pediatric): Secondary | ICD-10-CM

## 2018-11-03 DIAGNOSIS — J189 Pneumonia, unspecified organism: Secondary | ICD-10-CM

## 2018-11-03 DIAGNOSIS — J90 Pleural effusion, not elsewhere classified: Secondary | ICD-10-CM

## 2018-11-03 DIAGNOSIS — R935 Abnormal findings on diagnostic imaging of other abdominal regions, including retroperitoneum: Secondary | ICD-10-CM

## 2018-11-03 DIAGNOSIS — A419 Sepsis, unspecified organism: Secondary | ICD-10-CM

## 2018-11-03 DIAGNOSIS — E1122 Type 2 diabetes mellitus with diabetic chronic kidney disease: Secondary | ICD-10-CM

## 2018-11-03 DIAGNOSIS — G9340 Encephalopathy, unspecified: Secondary | ICD-10-CM

## 2018-11-03 DIAGNOSIS — I10 Essential (primary) hypertension: Secondary | ICD-10-CM

## 2018-11-03 DIAGNOSIS — R7989 Other specified abnormal findings of blood chemistry: Secondary | ICD-10-CM

## 2018-11-03 NOTE — Progress Notes (Signed)
:    Location:  [facility] Room Number: 604V Place of Service:  SNF (31)  Tannah Dreyfuss D. [name], MD  Patient Care Team: Patient, No Pcp Per as PCP - General ([place]) [name], MD as Consulting Physician (Nephrology)  Extended Emergency Contact Information Primary Emergency Contact: [name],[name] Address: [place], [place] [address] [name] of [place] Phone: [phone] Mobile Phone: [phone] Relation: Daughter     Allergies: Tape  Chief Complaint  Patient presents with  . New Admit To SNF    Admit to [facility]    HPI: Patient is 77 y.o. male with end-stage renal disease on dialysis Monday Wednesday Friday, atrial fib, congestive heart failure, diabetes, hypertension, hyperlipidemia, OSA, who presented from his nursing home to the hospital for evaluation of altered mental status.  He presented with hypotension concerning for sepsis.  Patient was admitted to [facility] from 10/20 4-11/5.  He was started on broad-spectrum antibiotic's in the emergency department he was noted to be hypoxic and had a CT chest concerning for edema versus inflammation or atypical infection he was started on coverage for CAP.  The CT was also notable for a right sided effusion, IR was consulted but it was not enough to tap.  He was also noted to have elevated ammonia, which was treated with lactulose which worsened the diarrhea the patient already having which was stopped patient's mental status improved and he is admitted to skilled nursing facility for OT/PT.  While at skilled nursing facility patient will be followed for OSA treated with CPAP, pretension treated with metoprolol and end-stage renal disease treated with dialysis and PhosLo.   Past Medical History:  Diagnosis Date  . A-fib ([place])   . Anemia   . Blood transfusion   . BPH (benign prostatic hyperplasia)   . CHF (congestive heart failure) ([place])   . Chronic  kidney disease   . CKD (chronic kidney disease)   . Diarrhea   . DM (diabetes mellitus) ([place])   . History of GI bleed    secondary to coumadin  . HTN (hypertension)   . Hyperlipidemia   . OSA (obstructive sleep apnea)    uses CPAP  . Secondary hyperparathyroidism of renal origin [facility])     Past Surgical History:  Procedure Laterality Date  . BVT  6/96/29   Left  Basilic Vein Transposition  . CHOLECYSTECTOMY    . EYE SURGERY     Catarct bil  . INSERTION OF DIALYSIS CATHETER  05/28/2012   Procedure: INSERTION OF DIALYSIS CATHETER;  Surgeon: Mal Misty, MD;  Location: [facility];  Service: Vascular;  Laterality: Right;  . Left arm shuntogram.    . Left forearm loop graft with 6 mm Gore-Tex graft.    . Pars plana vitrectomy with 25-gauge system      Allergies as of 11/03/2018      Reactions   Tape Itching, Other (See Comments)   Cloth tape only      Medication List        Accurate as of 11/03/18 10:13 AM. Always use your most recent med list.          acetaminophen 500 MG tablet Commonly known as:  TYLENOL Take 1,000 mg by mouth 3 (three) times daily.   aspirin EC 325 MG tablet Take 1 tablet (325 mg total) by mouth daily.   calcium acetate 667 MG capsule  Commonly known as:  PHOSLO Take 2,668 mg by mouth 3 (three) times daily with meals.   hydrOXYzine 25 MG tablet Commonly known as:  ATARAX/VISTARIL Take 25 mg by mouth every 8 (eight) hours as needed for itching.   metoprolol tartrate 50 MG tablet Commonly known as:  LOPRESSOR Take 1 tablet (50 mg total) by mouth 2 (two) times daily.       No orders of the defined types were placed in this encounter.    There is no immunization history on file for this patient.  Social History   Tobacco Use  . Smoking status: Former Smoker    Types: Cigarettes    Last attempt to quit: 06/30/2004    Years since quitting: 14.3  . Smokeless tobacco: Never Used  Substance Use Topics  . Alcohol use: No    Family history  is   Family History  Problem Relation Age of Onset  . Alzheimer's disease Mother   . Diabetes Father        Amputation:  bilateral legs  . Cancer Daughter        breast cancer  . Diabetes Son   . Heart disease Son        before age 82  . Hypertension Son   . Anesthesia problems Neg Hx       Review of Systems  DATA OBTAINED: from patient, nurse GENERAL:  no fevers, fatigue, appetite changes SKIN: No itching, or rash EYES: No eye pain, redness, discharge EARS: No earache, tinnitus, change in hearing NOSE: No congestion, drainage or bleeding  MOUTH/THROAT: No mouth or tooth pain, No sore throat RESPIRATORY: No cough, wheezing, SOB CARDIAC: No chest pain, palpitations, lower extremity edema  GI: No abdominal pain, No N/V/D or constipation, No heartburn or reflux  GU: No dysuria, frequency or urgency, or incontinence  MUSCULOSKELETAL: No unrelieved bone/joint pain NEUROLOGIC: No headache, dizziness or focal weakness PSYCHIATRIC: No c/o anxiety or sadness   Vitals:   11/03/18 1011  BP: (!) 102/50  Pulse: 87  Resp: 20  Temp: 97.6 F (36.4 C)    SpO2 Readings from Last 1 Encounters:  11/02/18 90%   Body mass index is 25.6 kg/m.     Physical Exam  GENERAL APPEARANCE: Alert, conversant,  No acute distress.  SKIN: No diaphoresis rash HEAD: Normocephalic, atraumatic  EYES: Conjunctiva/lids clear. Pupils round, reactive. EOMs intact.  EARS: External exam WNL, canals clear. Hearing grossly normal.  NOSE: No deformity or discharge.  MOUTH/THROAT: Lips w/o lesions  RESPIRATORY: Breathing is even, unlabored. Lung sounds are clear   CARDIOVASCULAR: Heart RRR no murmurs, rubs or gallops. No peripheral edema.   GASTROINTESTINAL: Abdomen is soft, non-tender, not distended w/ normal bowel sounds. GENITOURINARY: Bladder non tender, not distended  MUSCULOSKELETAL: No abnormal joints or musculature NEUROLOGIC:  Cranial nerves 2-12 grossly intact. Moves all extremities    PSYCHIATRIC: Mood and affect appropriate to situation, no behavioral issues  Patient Active Problem List   Diagnosis Date Noted  . Sepsis ([place]) 10/21/2018  . Acute encephalopathy 10/21/2018  . Elevated alkaline phosphatase level 10/21/2018  . Chronic anemia 10/21/2018  . Thrombocytopenia ([place]) 10/21/2018  . Pulmonary HTN ([place]) 03/04/2017  . Aortic valve stenosis 03/04/2017  . Hypertension, accelerated with heart disease, without CHF 01/12/2017  . Dyspnea on exertion 01/12/2017  . End stage renal disease ([place]) 06/07/2012  . Hyperlipidemia 04/24/2011  . HYPERTENSION, BENIGN 10/24/2009  . Type 2 diabetes mellitus ([place]) 10/23/2009  . OBSTRUCTIVE SLEEP APNEA 10/23/2009  .  PAF (paroxysmal atrial fibrillation) ([place]) 10/23/2009  . CHF (congestive heart failure) ([place]) 10/23/2009      Labs reviewed: Basic Metabolic Panel:    Component Value Date/Time   NA 137 11/02/2018 0852   K 5.0 11/02/2018 0852   CL 100 11/02/2018 0852   CO2 24 11/02/2018 0852   GLUCOSE 80 11/02/2018 0852   BUN 54 (H) 11/02/2018 0852   CREATININE 7.72 (H) 11/02/2018 0852   CALCIUM 9.1 11/02/2018 0852   CALCIUM 8.3 (L) 10/25/2008 1450   PROT 6.8 10/26/2018 0538   ALBUMIN 2.8 (L) 11/02/2018 0852   AST 24 10/26/2018 0538   ALT 22 10/26/2018 0538   ALKPHOS 170 (H) 10/26/2018 0538   BILITOT 1.1 10/26/2018 0538   GFRNONAA 6 (L) 11/02/2018 0852   GFRAA 7 (L) 11/02/2018 0852    Recent Labs    10/24/18 5462 10/25/18 0759  10/26/18 0538  10/29/18 0106 10/31/18 0651 11/01/18 0732 11/02/18 0852  NA 142  --    < > 143   < > 134* 138 136 137  K 5.8*  --    < > 4.2   < > 4.7 6.0* 5.0 5.0  CL 103  --    < > 102   < > 98 102 99 100  CO2 23  --    < > 26   < > 27 23 26 24   GLUCOSE 113*  --    < > 90   < > 115* 96 105* 80  BUN 66*  --    < > 48*   < > 41* 61* 32* 54*  CREATININE 7.80*  --    < > 6.97*   < > 6.05* 8.53* 6.00* 7.72*  CALCIUM 9.8  --    < > 9.2   < > 9.1 9.3 9.2 9.1  MG 2.5* 2.2  --  2.3  --    --   --   --   --   PHOS  --   --   --   --    < > 4.5 4.9*  --  3.9   < > = values in this interval not displayed.   Liver Function Tests: Recent Labs    10/24/18 0614 10/25/18 0759 10/26/18 0538  10/29/18 0106 10/31/18 0651 11/02/18 0852  AST 30 32 24  --   --   --   --   ALT 26 26 22   --   --   --   --   ALKPHOS 206* 182* 170*  --   --   --   --   BILITOT 1.7* 1.2 1.1  --   --   --   --   PROT 6.9 7.0 6.8  --   --   --   --   ALBUMIN 3.1* 3.1* 3.0*   < > 2.9* 3.0* 2.8*   < > = values in this interval not displayed.   Recent Labs    10/21/18 0520 10/24/18 0614  LIPASE 35 32   Recent Labs    10/22/18 1228 10/25/18 0759 10/26/18 0538  AMMONIA 42* 32 42*   CBC: Recent Labs    10/13/18 0715 10/20/18 1645  10/30/18 1111 10/31/18 0651 11/02/18 0852  WBC 7.5 6.8   < > 7.3 7.7 7.7  NEUTROABS 5.1 4.3  --   --   --   --   HGB 10.4* 9.1*   < > 8.7* 9.5* 8.7*  HCT 35.5* 30.0*   < >  27.8* 30.8* 28.3*  MCV 104.7* 106.4*   < > 104.9* 105.1* 104.8*  PLT 106* 140*   < > 111* 127* 144*   < > = values in this interval not displayed.   Lipid No results for input(s): CHOL, HDL, LDLCALC, TRIG in the last 8760 hours.  Cardiac Enzymes: No results for input(s): CKTOTAL, CKMB, CKMBINDEX, TROPONINI in the last 8760 hours. BNP: No results for input(s): BNP in the last 8760 hours. No results found for: [facility] Lab Results  Component Value Date   HGBA1C 4.2 (L) 10/21/2018   Lab Results  Component Value Date   TSH 1.579 10/28/2018   Lab Results  Component Value Date   VITAMINB12 1,046 (H) 10/21/2018   Lab Results  Component Value Date   FOLATE  10/17/2008    >20.0 (NOTE)  Reference Ranges        Deficient:       0.4 - 3.3 ng/mL        Indeterminate:   3.4 - 5.4 ng/mL        Normal:              > 5.4 ng/mL   Lab Results  Component Value Date   IRON 48 06/05/2011   TIBC 194 (L) 06/05/2011   FERRITIN 872 (H) 06/05/2011    Imaging and Procedures obtained prior to  SNF admission: Ct Head Wo Contrast  Result Date: 10/20/2018 CLINICAL DATA:  Altered LOC EXAM: CT HEAD WITHOUT CONTRAST TECHNIQUE: Contiguous axial images were obtained from the base of the skull through the vertex without intravenous contrast. COMPARISON:  10/13/2018 FINDINGS: Brain: No acute hemorrhage or intracranial mass. 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 Old lacunar infarct left basal ganglia. Moderate-to-marked atrophy. Moderate small vessel ischemic changes of the white matter. Stable enlarged ventricles. Vascular: No hyperdense vessels.  Carotid vascular calcification Skull: Normal. Negative for fracture or focal lesion. Sinuses/Orbits: Mild mucosal thickening in the sinuses. No acute orbital abnormality. Other: None IMPRESSION: 1. No CT evidence for acute intracranial abnormality. 2. Atrophy and small-vessel ischemic changes of the white matter. 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 Electronically Signed   By: Donavan Foil M.D.   On: 10/20/2018 18:49   US Abdomen Complete  Result Date: 10/21/2018 CLINICAL DATA:  Abdominal pain EXAM: ABDOMEN ULTRASOUND COMPLETE COMPARISON:  None. FINDINGS: Gallbladder: Surgically absent Common bile duct: Diameter: 7 mm-upper normal for age. No evident filling defect Liver: No focal lesion identified. Within normal limits in parenchymal echogenicity. Portal vein is patent on color Doppler imaging with normal direction of blood flow towards the liver. IVC: No abnormality visualized. Pancreas: Visualized portion unremarkable. Spleen: Size and appearance within normal limits. Right Kidney: Known end-stage renal disease. Length: 8 cm. Increased echogenicity. No hydronephrosis or mass Left Kidney: Length: 7 cm. There is increased echogenicity. No hydronephrosis or mass. Abdominal aorta: No aneurysm visualized. The distal aorta is not visible due to bowel gas. Other findings: A right pleural effusion is partially visualized. IMPRESSION: 1. No acute intra-abdominal  finding. 2. Right pleural effusion. Electronically Signed   By: Monte Fantasia M.D.   On: 10/21/2018 18:15   Dg Chest Port 1 View  Result Date: 10/20/2018 CLINICAL DATA:  Altered mental status and hypotension. EXAM: PORTABLE CHEST 1 VIEW COMPARISON:  10/13/2018 and prior radiographs FINDINGS: UPPER limits normal heart size noted. Mild interstitial prominence again noted. No airspace disease, pleural effusion or pneumothorax. No acute bony abnormality identified. IMPRESSION: UPPER limits normal heart size with unchanged interstitial prominence. Electronically Signed  By: Margarette Canada M.D.   On: 10/20/2018 16:42     Not all labs, radiology exams or other studies done during hospitalization come through on my EPIC note; however they are reviewed by me.    Assessment and Plan  Acute respiratory failure with hypoxia/CAP/right-sided pleural effusion- patient was treated for pneumonia with 7-day course of antibiotics; there is also concern for pulmonary edema for which she received hemodialysis SNF- admit for OT/PT  Sepsis/acute metabolic encephalopathy/elevated ammonia level- mental status back to baseline; patient was given lactulose for elevated ammonia with explosive diarrhea so this was stopped; normal B12 level was noted normal LFTs; CT abdomen did not show any abnormalities of the liver SNF- supportive care  Abnormal CT abdomen-showed possible lytic lesions in the femoral head as well as L3 vertebral body; MRI of the hip was attempted twice but patient did not tolerate this despite sedation; biopsy is not possible; SPEP was ordered and results are pending PSA noted to be 6.6 and not significantly elevated; lesions are noted not to be suggestive of metastatic disease; plan is to follow-up results of SPEP with appropriate referral if positive and will reattempt MRI in several weeks SNF- follow-up SPEP and possible repeat MRI as outpatient when patient is discharged  Paroxysmal atrial fibrillar  RVR-metoprolol dose was increased; patient not on anticoagulation as per radiology note from 2018 due to GI bleed SNF-continue metoprolol 50 mg twice daily  Diabetes mellitus type II- A1c 4.2, diabetic no longer, Prandin is DC'd  OSA SNF- continue nightly CPAP  Hypertension SNF- continue metoprolol 50 mg twice daily  End-stage renal disease SNF- continue dialysis Monday Wednesday Friday, continue PhosLo 2650 mg by mouth 3 times daily with meals   Time spent greater than 45 minutes;> 50% of time with patient was spent reviewing records, labs, tests and studies, counseling and developing plan of care  Hennie Duos, MD

## 2018-11-04 ENCOUNTER — Encounter: Payer: Self-pay | Admitting: Internal Medicine

## 2018-11-04 DIAGNOSIS — R7989 Other specified abnormal findings of blood chemistry: Secondary | ICD-10-CM | POA: Insufficient documentation

## 2018-11-04 DIAGNOSIS — J189 Pneumonia, unspecified organism: Secondary | ICD-10-CM | POA: Insufficient documentation

## 2018-11-04 DIAGNOSIS — I4891 Unspecified atrial fibrillation: Secondary | ICD-10-CM | POA: Insufficient documentation

## 2018-11-04 DIAGNOSIS — J9601 Acute respiratory failure with hypoxia: Secondary | ICD-10-CM | POA: Insufficient documentation

## 2018-11-04 DIAGNOSIS — J90 Pleural effusion, not elsewhere classified: Secondary | ICD-10-CM | POA: Insufficient documentation

## 2018-11-04 DIAGNOSIS — R935 Abnormal findings on diagnostic imaging of other abdominal regions, including retroperitoneum: Secondary | ICD-10-CM | POA: Insufficient documentation

## 2018-11-04 DIAGNOSIS — J9602 Acute respiratory failure with hypercapnia: Secondary | ICD-10-CM

## 2018-11-06 ENCOUNTER — Inpatient Hospital Stay (HOSPITAL_COMMUNITY)
Admission: EM | Admit: 2018-11-06 | Discharge: 2018-11-17 | DRG: 270 | Disposition: A | Discharge status: Skilled Nursing Facility | Payer: Medicare Other | Attending: Internal Medicine | Admitting: Internal Medicine

## 2018-11-06 ENCOUNTER — Emergency Department (HOSPITAL_COMMUNITY): Payer: Medicare Other

## 2018-11-06 DIAGNOSIS — Z79899 Other long term (current) drug therapy: Secondary | ICD-10-CM

## 2018-11-06 DIAGNOSIS — G9340 Encephalopathy, unspecified: Secondary | ICD-10-CM | POA: Diagnosis present

## 2018-11-06 DIAGNOSIS — N400 Enlarged prostate without lower urinary tract symptoms: Secondary | ICD-10-CM | POA: Diagnosis present

## 2018-11-06 DIAGNOSIS — N2581 Secondary hyperparathyroidism of renal origin: Secondary | ICD-10-CM | POA: Diagnosis present

## 2018-11-06 DIAGNOSIS — Z91048 Other nonmedicinal substance allergy status: Secondary | ICD-10-CM

## 2018-11-06 DIAGNOSIS — I739 Peripheral vascular disease, unspecified: Secondary | ICD-10-CM | POA: Diagnosis not present

## 2018-11-06 DIAGNOSIS — G4733 Obstructive sleep apnea (adult) (pediatric): Secondary | ICD-10-CM | POA: Diagnosis present

## 2018-11-06 DIAGNOSIS — Y95 Nosocomial condition: Secondary | ICD-10-CM | POA: Diagnosis present

## 2018-11-06 DIAGNOSIS — E11649 Type 2 diabetes mellitus with hypoglycemia without coma: Secondary | ICD-10-CM | POA: Diagnosis not present

## 2018-11-06 DIAGNOSIS — Z8673 Personal history of transient ischemic attack (TIA), and cerebral infarction without residual deficits: Secondary | ICD-10-CM

## 2018-11-06 DIAGNOSIS — Z515 Encounter for palliative care: Secondary | ICD-10-CM | POA: Diagnosis not present

## 2018-11-06 DIAGNOSIS — Z794 Long term (current) use of insulin: Secondary | ICD-10-CM

## 2018-11-06 DIAGNOSIS — E872 Acidosis, unspecified: Secondary | ICD-10-CM

## 2018-11-06 DIAGNOSIS — D539 Nutritional anemia, unspecified: Secondary | ICD-10-CM | POA: Diagnosis present

## 2018-11-06 DIAGNOSIS — I5082 Biventricular heart failure: Secondary | ICD-10-CM | POA: Diagnosis present

## 2018-11-06 DIAGNOSIS — R29701 NIHSS score 1: Secondary | ICD-10-CM | POA: Diagnosis present

## 2018-11-06 DIAGNOSIS — J9601 Acute respiratory failure with hypoxia: Secondary | ICD-10-CM | POA: Diagnosis present

## 2018-11-06 DIAGNOSIS — I480 Paroxysmal atrial fibrillation: Secondary | ICD-10-CM | POA: Diagnosis present

## 2018-11-06 DIAGNOSIS — I70262 Atherosclerosis of native arteries of extremities with gangrene, left leg: Secondary | ICD-10-CM | POA: Diagnosis not present

## 2018-11-06 DIAGNOSIS — E8889 Other specified metabolic disorders: Secondary | ICD-10-CM | POA: Diagnosis present

## 2018-11-06 DIAGNOSIS — Z7189 Other specified counseling: Secondary | ICD-10-CM

## 2018-11-06 DIAGNOSIS — I6330 Cerebral infarction due to thrombosis of unspecified cerebral artery: Secondary | ICD-10-CM

## 2018-11-06 DIAGNOSIS — F17210 Nicotine dependence, cigarettes, uncomplicated: Secondary | ICD-10-CM | POA: Diagnosis present

## 2018-11-06 DIAGNOSIS — N186 End stage renal disease: Secondary | ICD-10-CM | POA: Diagnosis present

## 2018-11-06 DIAGNOSIS — Z9981 Dependence on supplemental oxygen: Secondary | ICD-10-CM

## 2018-11-06 DIAGNOSIS — Z992 Dependence on renal dialysis: Secondary | ICD-10-CM | POA: Diagnosis not present

## 2018-11-06 DIAGNOSIS — I132 Hypertensive heart and chronic kidney disease with heart failure and with stage 5 chronic kidney disease, or end stage renal disease: Principal | ICD-10-CM | POA: Diagnosis present

## 2018-11-06 DIAGNOSIS — R0602 Shortness of breath: Secondary | ICD-10-CM

## 2018-11-06 DIAGNOSIS — E785 Hyperlipidemia, unspecified: Secondary | ICD-10-CM | POA: Diagnosis present

## 2018-11-06 DIAGNOSIS — I639 Cerebral infarction, unspecified: Secondary | ICD-10-CM | POA: Diagnosis not present

## 2018-11-06 DIAGNOSIS — E1122 Type 2 diabetes mellitus with diabetic chronic kidney disease: Secondary | ICD-10-CM

## 2018-11-06 DIAGNOSIS — I5033 Acute on chronic diastolic (congestive) heart failure: Secondary | ICD-10-CM | POA: Diagnosis present

## 2018-11-06 DIAGNOSIS — I10 Essential (primary) hypertension: Secondary | ICD-10-CM | POA: Diagnosis present

## 2018-11-06 DIAGNOSIS — E8770 Fluid overload, unspecified: Secondary | ICD-10-CM

## 2018-11-06 DIAGNOSIS — D649 Anemia, unspecified: Secondary | ICD-10-CM | POA: Diagnosis present

## 2018-11-06 DIAGNOSIS — Z66 Do not resuscitate: Secondary | ICD-10-CM | POA: Diagnosis not present

## 2018-11-06 DIAGNOSIS — R5381 Other malaise: Secondary | ICD-10-CM | POA: Diagnosis present

## 2018-11-06 DIAGNOSIS — J189 Pneumonia, unspecified organism: Secondary | ICD-10-CM | POA: Diagnosis present

## 2018-11-06 DIAGNOSIS — I361 Nonrheumatic tricuspid (valve) insufficiency: Secondary | ICD-10-CM | POA: Diagnosis not present

## 2018-11-06 DIAGNOSIS — E1152 Type 2 diabetes mellitus with diabetic peripheral angiopathy with gangrene: Secondary | ICD-10-CM | POA: Diagnosis present

## 2018-11-06 DIAGNOSIS — I4581 Long QT syndrome: Secondary | ICD-10-CM | POA: Diagnosis present

## 2018-11-06 DIAGNOSIS — D638 Anemia in other chronic diseases classified elsewhere: Secondary | ICD-10-CM | POA: Diagnosis present

## 2018-11-06 DIAGNOSIS — I6523 Occlusion and stenosis of bilateral carotid arteries: Secondary | ICD-10-CM | POA: Diagnosis present

## 2018-11-06 DIAGNOSIS — Z7982 Long term (current) use of aspirin: Secondary | ICD-10-CM

## 2018-11-06 DIAGNOSIS — I70245 Atherosclerosis of native arteries of left leg with ulceration of other part of foot: Secondary | ICD-10-CM | POA: Diagnosis not present

## 2018-11-06 DIAGNOSIS — G9341 Metabolic encephalopathy: Secondary | ICD-10-CM

## 2018-11-06 DIAGNOSIS — D631 Anemia in chronic kidney disease: Secondary | ICD-10-CM | POA: Diagnosis present

## 2018-11-06 DIAGNOSIS — J440 Chronic obstructive pulmonary disease with acute lower respiratory infection: Secondary | ICD-10-CM | POA: Diagnosis present

## 2018-11-06 DIAGNOSIS — J9602 Acute respiratory failure with hypercapnia: Secondary | ICD-10-CM | POA: Diagnosis present

## 2018-11-06 DIAGNOSIS — I120 Hypertensive chronic kidney disease with stage 5 chronic kidney disease or end stage renal disease: Secondary | ICD-10-CM

## 2018-11-06 DIAGNOSIS — R400 Somnolence: Secondary | ICD-10-CM

## 2018-11-06 DIAGNOSIS — I350 Nonrheumatic aortic (valve) stenosis: Secondary | ICD-10-CM | POA: Diagnosis present

## 2018-11-06 DIAGNOSIS — I63411 Cerebral infarction due to embolism of right middle cerebral artery: Secondary | ICD-10-CM

## 2018-11-06 DIAGNOSIS — F0150 Vascular dementia without behavioral disturbance: Secondary | ICD-10-CM | POA: Diagnosis present

## 2018-11-06 DIAGNOSIS — Z6823 Body mass index (BMI) 23.0-23.9, adult: Secondary | ICD-10-CM

## 2018-11-06 DIAGNOSIS — L97529 Non-pressure chronic ulcer of other part of left foot with unspecified severity: Secondary | ICD-10-CM | POA: Diagnosis present

## 2018-11-06 DIAGNOSIS — Z8719 Personal history of other diseases of the digestive system: Secondary | ICD-10-CM | POA: Diagnosis not present

## 2018-11-06 DIAGNOSIS — E11621 Type 2 diabetes mellitus with foot ulcer: Secondary | ICD-10-CM | POA: Diagnosis present

## 2018-11-06 DIAGNOSIS — R0600 Dyspnea, unspecified: Secondary | ICD-10-CM

## 2018-11-06 DIAGNOSIS — I96 Gangrene, not elsewhere classified: Secondary | ICD-10-CM | POA: Diagnosis not present

## 2018-11-06 DIAGNOSIS — I272 Pulmonary hypertension, unspecified: Secondary | ICD-10-CM | POA: Diagnosis present

## 2018-11-06 DIAGNOSIS — R627 Adult failure to thrive: Secondary | ICD-10-CM | POA: Diagnosis present

## 2018-11-06 DIAGNOSIS — T424X5A Adverse effect of benzodiazepines, initial encounter: Secondary | ICD-10-CM | POA: Diagnosis not present

## 2018-11-06 HISTORY — DX: Dependence on renal dialysis: Z99.2

## 2018-11-06 HISTORY — DX: Dependence on renal dialysis: N18.6

## 2018-11-06 LAB — BASIC METABOLIC PANEL
Anion gap: 18 — ABNORMAL HIGH (ref 5–15)
BUN: 48 mg/dL — ABNORMAL HIGH (ref 8–23)
CALCIUM: 9.4 mg/dL (ref 8.9–10.3)
CO2: 22 mmol/L (ref 22–32)
Chloride: 97 mmol/L — ABNORMAL LOW (ref 98–111)
Creatinine, Ser: 6.36 mg/dL — ABNORMAL HIGH (ref 0.61–1.24)
GFR calc non Af Amer: 7 mL/min — ABNORMAL LOW (ref >60)
GFR, EST AFRICAN AMERICAN: 9 mL/min — AB (ref >60)
Glucose, Bld: 145 mg/dL — ABNORMAL HIGH (ref 70–99)
Potassium: 4.1 mmol/L (ref 3.5–5.1)
Sodium: 137 mmol/L (ref 135–145)

## 2018-11-06 LAB — CBC
HCT: 30.7 % — ABNORMAL LOW (ref 39.0–52.0)
Hemoglobin: 9.4 g/dL — ABNORMAL LOW (ref 13.0–17.0)
MCH: 32.5 pg (ref 26.0–34.0)
MCHC: 30.6 g/dL (ref 30.0–36.0)
MCV: 106.2 fL — ABNORMAL HIGH (ref 80.0–100.0)
NRBC: 0 % (ref 0.0–0.2)
Platelets: 173 10*3/uL (ref 150–400)
RBC: 2.89 MIL/uL — ABNORMAL LOW (ref 4.22–5.81)
RDW: 17.7 % — AB (ref 11.5–15.5)
WBC: 9.8 10*3/uL (ref 4.0–10.5)

## 2018-11-06 LAB — I-STAT CG4 LACTIC ACID, ED
LACTIC ACID, VENOUS: 1.76 mmol/L (ref 0.5–1.9)
Lactic Acid, Venous: 2.94 mmol/L (ref 0.5–1.9)

## 2018-11-06 LAB — I-STAT ARTERIAL BLOOD GAS, ED
Acid-Base Excess: 2 mmol/L (ref 0.0–2.0)
BICARBONATE: 27.9 mmol/L (ref 20.0–28.0)
O2 Saturation: 49 %
PCO2 ART: 50.7 mmHg — AB (ref 32.0–48.0)
TCO2: 30 mmol/L (ref 22–32)
pH, Arterial: 7.346 — ABNORMAL LOW (ref 7.350–7.450)
pO2, Arterial: 27 mmHg — CL (ref 83.0–108.0)

## 2018-11-06 LAB — AMMONIA: AMMONIA: 40 umol/L — AB (ref 9–35)

## 2018-11-06 LAB — I-STAT CHEM 8, ED
BUN: 50 mg/dL — AB (ref 8–23)
Calcium, Ion: 1.1 mmol/L — ABNORMAL LOW (ref 1.15–1.40)
Chloride: 99 mmol/L (ref 98–111)
Creatinine, Ser: 6.9 mg/dL — ABNORMAL HIGH (ref 0.61–1.24)
Glucose, Bld: 143 mg/dL — ABNORMAL HIGH (ref 70–99)
HCT: 30 % — ABNORMAL LOW (ref 39.0–52.0)
Hemoglobin: 10.2 g/dL — ABNORMAL LOW (ref 13.0–17.0)
POTASSIUM: 4.1 mmol/L (ref 3.5–5.1)
Sodium: 136 mmol/L (ref 135–145)
TCO2: 28 mmol/L (ref 22–32)

## 2018-11-06 LAB — BRAIN NATRIURETIC PEPTIDE: B Natriuretic Peptide: 1611.6 pg/mL — ABNORMAL HIGH (ref 0.0–100.0)

## 2018-11-06 LAB — CBG MONITORING, ED: Glucose-Capillary: 154 mg/dL — ABNORMAL HIGH (ref 70–99)

## 2018-11-06 LAB — TROPONIN I: Troponin I: <0.03 ng/mL (ref <0.03)

## 2018-11-06 LAB — PROCALCITONIN: Procalcitonin: 0.48 ng/mL

## 2018-11-06 MED ORDER — METOPROLOL TARTRATE 50 MG PO TABS
50.0000 mg | ORAL_TABLET | Freq: Two times a day (BID) | ORAL | Status: DC
  Administered 2018-11-07 (×2): 50 mg via ORAL
  Filled 2018-11-06: qty 2
  Filled 2018-11-06: qty 1

## 2018-11-06 MED ORDER — VANCOMYCIN VARIABLE DOSE PER UNSTABLE RENAL FUNCTION (PHARMACIST DOSING): Status: DC

## 2018-11-06 MED ORDER — HEPARIN SODIUM (PORCINE) 5000 UNIT/ML IJ SOLN
5000.0000 [IU] | Freq: Three times a day (TID) | INTRAMUSCULAR | Status: DC
  Administered 2018-11-07 – 2018-11-17 (×29): 5000 [IU] via SUBCUTANEOUS
  Filled 2018-11-06 (×29): qty 1

## 2018-11-06 MED ORDER — SODIUM CHLORIDE 0.9 % IV SOLN
2.0000 g | Freq: Once | INTRAVENOUS | Status: AC
  Administered 2018-11-06: 2 g via INTRAVENOUS
  Filled 2018-11-06: qty 2

## 2018-11-06 MED ORDER — HYDROXYZINE HCL 25 MG PO TABS: 25.0000 mg | ORAL_TABLET | Freq: Three times a day (TID) | ORAL | Status: DC | PRN

## 2018-11-06 MED ORDER — INSULIN ASPART 100 UNIT/ML ~~LOC~~ SOLN
0.0000 [IU] | Freq: Three times a day (TID) | SUBCUTANEOUS | Status: DC
  Administered 2018-11-08: 1 [IU] via SUBCUTANEOUS

## 2018-11-06 MED ORDER — ASPIRIN EC 325 MG PO TBEC
325.0000 mg | DELAYED_RELEASE_TABLET | Freq: Every day | ORAL | Status: DC
  Administered 2018-11-07 – 2018-11-14 (×8): 325 mg via ORAL
  Filled 2018-11-06 (×9): qty 1

## 2018-11-06 MED ORDER — SODIUM CHLORIDE 0.9 % IV SOLN
1.0000 g | INTRAVENOUS | Status: DC
  Administered 2018-11-07 – 2018-11-08 (×2): 1 g via INTRAVENOUS
  Filled 2018-11-06 (×2): qty 1

## 2018-11-06 MED ORDER — VANCOMYCIN HCL 10 G IV SOLR
1500.0000 mg | Freq: Once | INTRAVENOUS | Status: AC
  Administered 2018-11-07: 1500 mg via INTRAVENOUS
  Filled 2018-11-06: qty 1500

## 2018-11-06 MED ORDER — ACETAMINOPHEN 325 MG PO TABS
650.0000 mg | ORAL_TABLET | Freq: Four times a day (QID) | ORAL | Status: DC | PRN
  Administered 2018-11-10 – 2018-11-16 (×3): 650 mg via ORAL
  Filled 2018-11-06 (×2): qty 2

## 2018-11-06 MED ORDER — CALCIUM ACETATE (PHOS BINDER) 667 MG PO CAPS
2668.0000 mg | ORAL_CAPSULE | Freq: Three times a day (TID) | ORAL | Status: DC
  Administered 2018-11-07 – 2018-11-14 (×17): 2668 mg via ORAL
  Filled 2018-11-06 (×17): qty 4

## 2018-11-06 NOTE — ED Triage Notes (Signed)
To ED via [facility] from [employer] -- c/o near syncopal episode when transferring to commode . On arrival pt is alert/oriented x 4, w/d  CBG 360- received 100cc NS per EMS

## 2018-11-06 NOTE — Progress Notes (Signed)
RT NOTE:  ABG attempted x2. Sample collected resulted as venous blood. RT reported to Dr. Ellender Hose. MD did not order repeat.

## 2018-11-06 NOTE — ED Notes (Signed)
Pt CBG 154. Notified Patty, Therapist, sports.

## 2018-11-06 NOTE — ED Notes (Signed)
Dr Ellender Hose informed of lactic acid results 2.94

## 2018-11-06 NOTE — ED Provider Notes (Signed)
[name] EMERGENCY DEPARTMENT Provider Note   CSN: [id number] Arrival date & time: 11/06/18  1844     History   Chief Complaint Chief Complaint  Patient presents with  . Near Syncope    HPI [name] is a 77 y.o. male.  HPI 77 year old male with extensive past medical history as below including end-stage renal disease, hypertension, diabetes, sleep apnea, here with near syncope and drowsiness.  The patient states that he has been very drowsy throughout the day today.  He was trying to transition today, when he became very lightheaded.  He felt like he was going to pass out.  Also felt like he cannot stay awake.  Since then, he states that he is having difficulty staying awake for more than several seconds to minutes at a time.  He states he does feel mildly short of breath with a cough.  She was hospitalized for pneumonia.  He denies any known fevers.  He has had poor appetite.  He has been compliant with his dialysis.  Denies any specific alleviating or aggravating factors.  Of note, he is not currently using a CPAP, as it was reportedly lost when he was recently moved.  Past Medical History:  Diagnosis Date  . A-fib ([place])   . Anemia   . Blood transfusion   . BPH (benign prostatic hyperplasia)   . CHF (congestive heart failure) ([place])   . Chronic kidney disease   . CKD (chronic kidney disease)   . Diarrhea   . DM (diabetes mellitus) ([place])   . History of GI bleed    secondary to coumadin  . HTN (hypertension)   . Hyperlipidemia   . OSA (obstructive sleep apnea)    uses CPAP  . Secondary hyperparathyroidism of renal origin [facility])     Patient Active Problem List   Diagnosis Date Noted  . Acute hypoxemic respiratory failure ([place]) 11/06/2018  . Volume overload 11/06/2018  . Multifocal pneumonia 11/06/2018  . Acute metabolic encephalopathy 67/89/3810  . Physical deconditioning 11/06/2018  . Acute respiratory failure with hypoxia ([place]) 11/04/2018    . CAP (community acquired pneumonia) 11/04/2018  . Pleural effusion, right 11/04/2018  . Increased ammonia level 11/04/2018  . Abnormal CT of the abdomen 11/04/2018  . Atrial fibrillation with RVR ([place]) 11/04/2018  . Sepsis ([place]) 10/21/2018  . Acute encephalopathy 10/21/2018  . Elevated alkaline phosphatase level 10/21/2018  . Chronic anemia 10/21/2018  . Thrombocytopenia ([place]) 10/21/2018  . Pulmonary HTN ([place]) 03/04/2017  . Aortic valve stenosis 03/04/2017  . Hypertension, accelerated with heart disease, without CHF 01/12/2017  . Dyspnea on exertion 01/12/2017  . End stage renal disease ([place]) 06/07/2012  . Hyperlipidemia 04/24/2011  . HYPERTENSION, BENIGN 10/24/2009  . DM (diabetes mellitus) ([place]) 10/23/2009  . OBSTRUCTIVE SLEEP APNEA 10/23/2009  . PAF (paroxysmal atrial fibrillation) ([place]) 10/23/2009  . CHF (congestive heart failure) ([place]) 10/23/2009    Past Surgical History:  Procedure Laterality Date  . BVT  1/75/10   Left  Basilic Vein Transposition  . CHOLECYSTECTOMY    . EYE SURGERY     Catarct bil  . INSERTION OF DIALYSIS CATHETER  05/28/2012   Procedure: INSERTION OF DIALYSIS CATHETER;  Surgeon: Mal Misty, MD;  Location: [facility];  Service: Vascular;  Laterality: Right;  . Left arm shuntogram.    . Left forearm loop graft with 6 mm Gore-Tex graft.    . Pars plana vitrectomy with 25-gauge system  Home Medications    Prior to Admission medications   Medication Sig Start Date End Date Taking? Authorizing Provider  acetaminophen (TYLENOL) 500 MG tablet Take 1,000 mg by mouth 3 (three) times daily.   Yes [provider]  aspirin EC 325 MG tablet Take 1 tablet (325 mg total) by mouth daily. 02/14/15  Yes Nahser, Wonda Cheng, MD  calcium acetate (PHOSLO) 667 MG capsule Take 2,668 mg by mouth 3 (three) times daily with meals.  02/03/11  Yes [provider]  metoprolol tartrate (LOPRESSOR) 50 MG tablet Take 1 tablet (50 mg total) by mouth 2 (two)  times daily. 10/29/18  Yes Bonnielee Haff, MD  hydrOXYzine (ATARAX/VISTARIL) 25 MG tablet Take 25 mg by mouth every 8 (eight) hours as needed for itching.    [provider]    Family History Family History  Problem Relation Age of Onset  . Alzheimer's disease Mother   . Diabetes Father        Amputation:  bilateral legs  . Cancer Daughter        breast cancer  . Diabetes Son   . Heart disease Son        before age 75  . Hypertension Son   . Anesthesia problems Neg Hx     Social History Social History   Tobacco Use  . Smoking status: Former Smoker    Types: Cigarettes    Last attempt to quit: 06/30/2004    Years since quitting: 14.3  . Smokeless tobacco: Never Used  Substance Use Topics  . Alcohol use: No  . Drug use: No     Allergies   Tape   Review of Systems Review of Systems  Constitutional: Positive for fatigue. Negative for chills and fever.  HENT: Negative for congestion and rhinorrhea.   Eyes: Negative for visual disturbance.  Respiratory: Positive for cough and shortness of breath. Negative for wheezing.   Cardiovascular: Negative for chest pain and leg swelling.  Gastrointestinal: Negative for abdominal pain, diarrhea, nausea and vomiting.  Genitourinary: Negative for dysuria and flank pain.  Musculoskeletal: Negative for neck pain and neck stiffness.  Skin: Negative for rash and wound.  Allergic/Immunologic: Negative for immunocompromised state.  Neurological: Positive for weakness. Negative for syncope and headaches.  All other systems reviewed and are negative.    Physical Exam Updated Vital Signs BP 129/77   Pulse 81   Temp (!) 97.5 F (36.4 C) (Oral)   Resp (!) 25   SpO2 100%   Physical Exam  Constitutional: He is oriented to person, place, and time. He appears well-developed and well-nourished. No distress.  HENT:  Head: Normocephalic and atraumatic.  Eyes: Conjunctivae are normal.  Neck: Neck supple.  Cardiovascular: Normal  rate and normal heart sounds. An irregularly irregular rhythm present. Exam reveals no friction rub.  No murmur heard. Pulmonary/Chest: Effort normal. No respiratory distress. He has decreased breath sounds. He has no wheezes. He has rales in the right lower field and the left lower field.  Abdominal: He exhibits no distension.  Musculoskeletal: He exhibits no edema.  Neurological: He is oriented to person, place, and time. He exhibits normal muscle tone.  Drowsy, falls asleep easily  Skin: Skin is warm. Capillary refill takes less than 2 seconds.  Psychiatric: He has a normal mood and affect.  Nursing note and vitals reviewed.    ED Treatments / Results  Labs (all labs ordered are listed, but only abnormal results are displayed) Labs Reviewed  BASIC METABOLIC PANEL -  Abnormal; Notable for the following components:      Result Value   Chloride 97 (*)    Glucose, Bld 145 (*)    BUN 48 (*)    Creatinine, Ser 6.36 (*)    GFR calc non Af Amer 7 (*)    GFR calc Af Amer 9 (*)    Anion gap 18 (*)    All other components within normal limits  CBC - Abnormal; Notable for the following components:   RBC 2.89 (*)    Hemoglobin 9.4 (*)    HCT 30.7 (*)    MCV 106.2 (*)    RDW 17.7 (*)    All other components within normal limits  BRAIN NATRIURETIC PEPTIDE - Abnormal; Notable for the following components:   B Natriuretic Peptide 1,611.6 (*)    All other components within normal limits  AMMONIA - Abnormal; Notable for the following components:   Ammonia 40 (*)    All other components within normal limits  CBG MONITORING, ED - Abnormal; Notable for the following components:   Glucose-Capillary 154 (*)    All other components within normal limits  I-STAT ARTERIAL BLOOD GAS, ED - Abnormal; Notable for the following components:   pH, Arterial 7.346 (*)    pCO2 arterial 50.7 (*)    pO2, Arterial 27.0 (*)    All other components within normal limits  I-STAT CHEM 8, ED - Abnormal; Notable  for the following components:   BUN 50 (*)    Creatinine, Ser 6.90 (*)    Glucose, Bld 143 (*)    Calcium, Ion 1.10 (*)    Hemoglobin 10.2 (*)    HCT 30.0 (*)    All other components within normal limits  I-STAT CG4 LACTIC ACID, ED - Abnormal; Notable for the following components:   Lactic Acid, Venous 2.94 (*)    All other components within normal limits  CULTURE, BLOOD (ROUTINE X 2)  CULTURE, BLOOD (ROUTINE X 2)  TROPONIN I  PROCALCITONIN  URINALYSIS, ROUTINE W REFLEX MICROSCOPIC  I-STAT CG4 LACTIC ACID, ED    EKG EKG Interpretation  Date/Time:  Sunday November 06 2018 18:47:08 EST Ventricular Rate:  71 PR Interval:    QRS Duration: 106 QT Interval:  443 QTC Calculation: 482 R Axis:   89 Text Interpretation:  Atrial fibrillation Anterior infarct, old Prolonged QT No significant change since last tracing Confirmed by Duffy Bruce [phone]) on 11/06/2018 7:42:00 PM   Radiology Dg Chest Portable 1 View  Result Date: 11/06/2018 CLINICAL DATA:  Syncope. EXAM: PORTABLE CHEST 1 VIEW COMPARISON:  October 28, 2018 FINDINGS: The heart size remains mildly enlarged. No pneumothorax. No nodules or masses. Patchy opacities in the lungs, particularly the right base. No other acute abnormalities. IMPRESSION: Patchy opacities in the lungs may represent multifocal pneumonia versus asymmetric edema. Recommend clinical correlation. Electronically Signed   By: Dorise Bullion III M.D   On: 11/06/2018 20:00    Procedures Procedures (including critical care time)  Medications Ordered in ED Medications  vancomycin (VANCOCIN) 1,500 mg in sodium chloride 0.9 % 500 mL IVPB (has no administration in time range)  ceFEPIme (MAXIPIME) 1 g in sodium chloride 0.9 % 100 mL IVPB (has no administration in time range)  vancomycin variable dose per unstable renal function (pharmacist dosing) (has no administration in time range)  acetaminophen (TYLENOL) tablet 650 mg (has no administration in time range)   aspirin EC tablet 325 mg (has no administration in time range)  metoprolol tartrate (LOPRESSOR)  tablet 50 mg (has no administration in time range)  hydrOXYzine (ATARAX/VISTARIL) tablet 25 mg (has no administration in time range)  calcium acetate (PHOSLO) capsule 2,668 mg (has no administration in time range)  heparin injection 5,000 Units (has no administration in time range)  insulin aspart (novoLOG) injection 0-9 Units (has no administration in time range)  ceFEPIme (MAXIPIME) 2 g in sodium chloride 0.9 % 100 mL IVPB (0 g Intravenous Stopped 11/06/18 2313)     Initial Impression / Assessment and Plan / ED Course  I have reviewed the triage vital signs and the nursing notes.  Pertinent labs & imaging results that were available during my care of the patient were reviewed by me and considered in my medical decision making (see chart for details).     77 yo M here with recurrent hypoxic respiratory failure, near syncope on transitioning, and drowsiness. DDx for recurrent hypoxic resp distress includes ongoing asymmetric pulm edema 2/2 ESRD, versus recurrent PNA. LA is elevated but I'm hesitant to give fluisds given markedly elevated BNP, edema on exam. CXR as above. Labs/lytes are otherwise reassuring/at baseline. Will cover empirically given elevated LA, Admit to medicine. Pt also notably has OSA and is no longer using CPAP.   Final Clinical Impressions(s) / ED Diagnoses   Final diagnoses:  Drowsiness  Acute respiratory failure with hypoxia (HCC)  Lactic acidosis    ED Discharge Orders    None       Duffy Bruce, MD 11/07/18 0001

## 2018-11-06 NOTE — ED Notes (Signed)
Pt wear cpap at nursing home "sometimes" per pt--currently has blowing type resp, falls asleep when not talking to staff- does wake up immediately and is oriented x 4.

## 2018-11-06 NOTE — Progress Notes (Signed)
RT NOTE:  RT noted patient presented signs of OSA during ABG collection. Pts daughter stated patient does have OSA and has worn CPAP in the past. During his move into rehab around 3 months ago the CPAP was lost and patient has not worn since. Pt has had periods of increased drowsiness and lethargic periods throughout this time. RT advised patient and daughter to contact PCP to obtain new order for CPAP once discharges. Dr. Ellender Hose made aware and will put in note for follow up services once discharged.

## 2018-11-06 NOTE — Progress Notes (Signed)
Pharmacy Antibiotic Note  [name] is a 77 y.o. male admitted on 11/06/2018 with pneumonia.  Pharmacy has been consulted for vancomycin and cefepime dosing. Pt has ESRD on HD MWF.  Plan: -Vancomycin 1500mg  IV x1 -Follow-up HD scheduling for further vancomycin dosing -Cefepime 2g IV x1 then 1g IV q24h     Temp (24hrs), Avg:97.5 F (36.4 C), Min:97.5 F (36.4 C), Max:97.5 F (36.4 C)  Recent Labs  Lab 10/31/18 0651 11/01/18 0732 11/02/18 0852 11/06/18 1855 11/06/18 1919 11/06/18 1920  WBC 7.7  --  7.7 9.8  --   --   CREATININE 8.53* 6.00* 7.72* 6.36*  --  6.90*  LATICACIDVEN  --   --   --   --  2.94*  --     Estimated Creatinine Clearance: 8 mL/min (A) (by C-G formula based on SCr of 6.9 mg/dL (H)).    Allergies  Allergen Reactions  . Tape Itching and Other (See Comments)    Cloth tape only    Antimicrobials this admission: Vancomycin 11/10 >> Cefepime 11/10 >>  Dose adjustments this admission: none  Microbiology results: sent  Thank you for allowing pharmacy to be a part of this patient's care.  [name], PharmD, BCPS Clinical Pharmacist [phone] Please check AMION for all [facility] numbers 11/06/2018

## 2018-11-06 NOTE — H&P (Addendum)
History and Physical    [name] ZOX:[id number] DOB: Feb 14, 1941 DOA: 11/06/2018  PCP: System, Pcp Not In Patient coming from: Assisted living facility  Chief Complaint: Shortness of breath, near-syncope  HPI: [name] is a 77 y.o. male with medical history significant of PAF, CHF, ESRD on hemodialysis MWF, diabetes, hypertension, hyperlipidemia, OSA presenting to the hospital for evaluation of shortness of breath and near syncope.  He denies having any shortness of breath, chest pain, dizziness, or episode of near syncope.  States he uses oxygen at home but is not sure how much.  Reports having chills.  Denies having any abdominal pain, nausea, or vomiting.  States he lives at a assisted living facility.  States his last dialysis was on Friday. No further history could be obtained from the patient.  Per history obtained from ED physician: "The patient states that he has been very drowsy throughout the day today.  He was trying to transition today, when he became very lightheaded.  He felt like he was going to pass out.  Also felt like he cannot stay awake.  Since then, he states that he is having difficulty staying awake for more than several seconds to minutes at a time.  He states he does feel mildly short of breath with a cough.  She was hospitalized for pneumonia.  He denies any known fevers.  He has had poor appetite.  He has been compliant with his dialysis.  Denies any specific alleviating or aggravating factors.  Of note, he is not currently using a CPAP, as it was reportedly lost when he was recently moved."  Patient was recently admitted from October 24th to November 5th for acute respiratory failure with hypoxia due to volume overload versus pneumonia, sepsis, and acute metabolic encephalopathy.  He was treated with a 7-day course of antibiotics and received hemodialysis.  ED Course: Afebrile, requiring 4 L supplemental oxygen.  No leukocytosis.  Lactic acid 2.94.  Ammonia  40.  CBG 154.  BNP 1611.  Troponin negative.  EKG showing A. Fib, rate controlled.  ABG showing pH 7.34 PCO2 50, PO2 27.  Chest x-ray showing patchy opacities in the lungs which may represent multifocal pneumonia versus asymmetric edema.  Orthostatics checked in the ED negative.  Patient received vancomycin and cefepime in the ED.  Review of Systems: As per HPI otherwise 10 point review of systems negative.  Past Medical History:  Diagnosis Date  . A-fib ([place])   . Anemia   . Blood transfusion   . BPH (benign prostatic hyperplasia)   . CHF (congestive heart failure) ([place])   . Chronic kidney disease   . CKD (chronic kidney disease)   . Diarrhea   . DM (diabetes mellitus) ([place])   . History of GI bleed    secondary to coumadin  . HTN (hypertension)   . Hyperlipidemia   . OSA (obstructive sleep apnea)    uses CPAP  . Secondary hyperparathyroidism of renal origin [facility])     Past Surgical History:  Procedure Laterality Date  . BVT  08/07/90   Left  Basilic Vein Transposition  . CHOLECYSTECTOMY    . EYE SURGERY     Catarct bil  . INSERTION OF DIALYSIS CATHETER  05/28/2012   Procedure: INSERTION OF DIALYSIS CATHETER;  Surgeon: Mal Misty, MD;  Location: [facility];  Service: Vascular;  Laterality: Right;  . Left arm shuntogram.    . Left forearm loop graft with 6 mm Gore-Tex graft.    Marland Kitchen  Pars plana vitrectomy with 25-gauge system       reports that he quit smoking about 14 years ago. His smoking use included cigarettes. He has never used smokeless tobacco. He reports that he does not drink alcohol or use drugs.  Allergies  Allergen Reactions  . Tape Itching and Other (See Comments)    Cloth tape only    Family History  Problem Relation Age of Onset  . Alzheimer's disease Mother   . Diabetes Father        Amputation:  bilateral legs  . Cancer Daughter        breast cancer  . Diabetes Son   . Heart disease Son        before age 1  . Hypertension Son   . Anesthesia problems  Neg Hx     Prior to Admission medications   Medication Sig Start Date End Date Taking? Authorizing Provider  acetaminophen (TYLENOL) 500 MG tablet Take 1,000 mg by mouth 3 (three) times daily.   Yes [provider]  aspirin EC 325 MG tablet Take 1 tablet (325 mg total) by mouth daily. 02/14/15  Yes Nahser, Wonda Cheng, MD  calcium acetate (PHOSLO) 667 MG capsule Take 2,668 mg by mouth 3 (three) times daily with meals.  02/03/11  Yes [provider]  metoprolol tartrate (LOPRESSOR) 50 MG tablet Take 1 tablet (50 mg total) by mouth 2 (two) times daily. 10/29/18  Yes Bonnielee Haff, MD  hydrOXYzine (ATARAX/VISTARIL) 25 MG tablet Take 25 mg by mouth every 8 (eight) hours as needed for itching.    [provider]    Physical Exam: Vitals:   11/06/18 1852 11/06/18 1900 11/06/18 1915 11/06/18 1930  BP: 97/60 106/65 112/69 105/62  Pulse:   79 69  Resp: (!) 25 18 (!) 21 (!) 22  Temp: (!) 97.5 F (36.4 C)     TempSrc: Oral     SpO2:   100% 100%    Physical Exam  Constitutional: He is oriented to person, place, and time. No distress.  Resting comfortably in a hospital stretcher  HENT:  Head: Normocephalic and atraumatic.  Mouth/Throat: Oropharynx is clear and moist.  Eyes: Right eye exhibits no discharge. Left eye exhibits no discharge.  Neck: Neck supple. No tracheal deviation present.  Cardiovascular: Normal rate, regular rhythm and intact distal pulses.  Murmur heard. Pulmonary/Chest: Effort normal. He has no wheezes.  Speaking clearly in full sentences No accessory muscle use Mild rales appreciated at the left lung base  Abdominal: Soft. Bowel sounds are normal. He exhibits no distension. There is no tenderness.  Musculoskeletal: He exhibits no edema.  Neurological: He is alert and oriented to person, place, and time.  Skin: Skin is warm and dry. He is not diaphoretic.  Psychiatric: He has a normal mood and affect.     Labs on Admission: I have personally  reviewed following labs and imaging studies  CBC: Recent Labs  Lab 10/31/18 0651 11/02/18 0852 11/06/18 1855 11/06/18 1920  WBC 7.7 7.7 9.8  --   HGB 9.5* 8.7* 9.4* 10.2*  HCT 30.8* 28.3* 30.7* 30.0*  MCV 105.1* 104.8* 106.2*  --   PLT 127* 144* 173  --    Basic Metabolic Panel: Recent Labs  Lab 10/31/18 0651 11/01/18 0732 11/02/18 0852 11/06/18 1855 11/06/18 1920  NA 138 136 137 137 136  K 6.0* 5.0 5.0 4.1 4.1  CL 102 99 100 97* 99  CO2 23 26 24 22   --  GLUCOSE 96 105* 80 145* 143*  BUN 61* 32* 54* 48* 50*  CREATININE 8.53* 6.00* 7.72* 6.36* 6.90*  CALCIUM 9.3 9.2 9.1 9.4  --   PHOS 4.9*  --  3.9  --   --    GFR: Estimated Creatinine Clearance: 8 mL/min (A) (by C-G formula based on SCr of 6.9 mg/dL (H)). Liver Function Tests: Recent Labs  Lab 10/31/18 0651 11/02/18 0852  ALBUMIN 3.0* 2.8*   No results for input(s): LIPASE, AMYLASE in the last 168 hours. Recent Labs  Lab 11/06/18 1850  AMMONIA 40*   Coagulation Profile: No results for input(s): INR, PROTIME in the last 168 hours. Cardiac Enzymes: Recent Labs  Lab 11/06/18 1855  TROPONINI <0.03   BNP (last 3 results) No results for input(s): PROBNP in the last 8760 hours. HbA1C: No results for input(s): HGBA1C in the last 72 hours. CBG: Recent Labs  Lab 11/01/18 2129 11/02/18 0758 11/02/18 1250 11/02/18 1346 11/06/18 1904  GLUCAP 108* 107* 102* 92 154*   Lipid Profile: No results for input(s): CHOL, HDL, LDLCALC, TRIG, CHOLHDL, LDLDIRECT in the last 72 hours. Thyroid Function Tests: No results for input(s): TSH, T4TOTAL, FREET4, T3FREE, THYROIDAB in the last 72 hours. Anemia Panel: No results for input(s): VITAMINB12, FOLATE, FERRITIN, TIBC, IRON, RETICCTPCT in the last 72 hours. Urine analysis:    Component Value Date/Time   COLORURINE YELLOW 10/20/2008 [place] 10/20/2008 0942   LABSPEC 1.011 10/20/2008 0942   PHURINE 6.0 10/20/2008 0942   GLUCOSEU NEGATIVE  10/20/2008 0942   HGBUR LARGE (A) 10/20/2008 0942   BILIRUBINUR NEGATIVE 10/20/2008 0942   KETONESUR NEGATIVE 10/20/2008 0942   PROTEINUR 100 (A) 10/20/2008 0942   UROBILINOGEN 1.0 10/20/2008 0942   NITRITE NEGATIVE 10/20/2008 0942   LEUKOCYTESUR SMALL (A) 10/20/2008 0942    Radiological Exams on Admission: Dg Chest Portable 1 View  Result Date: 11/06/2018 CLINICAL DATA:  Syncope. EXAM: PORTABLE CHEST 1 VIEW COMPARISON:  October 28, 2018 FINDINGS: The heart size remains mildly enlarged. No pneumothorax. No nodules or masses. Patchy opacities in the lungs, particularly the right base. No other acute abnormalities. IMPRESSION: Patchy opacities in the lungs may represent multifocal pneumonia versus asymmetric edema. Recommend clinical correlation. Electronically Signed   By: Dorise Bullion III M.D   On: 11/06/2018 20:00    EKG: Independently reviewed.  Rate controlled A. fib (heart rate 71).  Assessment/Plan Principal Problem:   Acute hypoxemic respiratory failure (HCC) Active Problems:   DM (diabetes mellitus) (HCC)   OBSTRUCTIVE SLEEP APNEA   HYPERTENSION, BENIGN   PAF (paroxysmal atrial fibrillation) (HCC)   End stage renal disease (HCC)   Acute encephalopathy   Chronic anemia   Volume overload   Multifocal pneumonia   Acute metabolic encephalopathy   Physical deconditioning   Acute hypoxemic respiratory failure secondary to volume overload versus multifocal pneumonia -ABG showing pH 7.34 PCO2 50, PO2 27. Patient is requiring 4 L supplemental oxygen at present.  -Chest x-ray showing patchy opacities in the lungs which may represent multifocal pneumonia versus asymmetric edema.  Lactic acid 2.94 in the setting of end-stage renal disease.  Patient is afebrile and does not have leukocytosis.  BNP elevated at 1611. -Continue broad-spectrum antibiotics at this time -vancomycin and cefepime -Check procalcitonin level - Blood pressure currently stable. Will hold off giving IV fluid  in the setting of end-stage renal disease. -ED physician discussed the case with nephrology, plan is to take the patient for hemodialysis early morning. -UA pending -  Blood culture x2 pending -Check procalcitonin level  Acute metabolic encephalopathy Likely due to metabolic derangements and hypoxia.  Ammonia mildly elevated 40.  Patient was not hypoglycemic on arrival.  Currently AAO x3. -Will hold off giving lactulose at this time as it resulted in explosive diarrhea during his previous hospitalization.   -Hemodialysis scheduled early in the morning  ESRD on hemodialysis on hemodialysis MWF -Hemodialysis scheduled early in the morning  Paroxysmal A. Fib -CHA2DS2VASc 5.  -Per chart review, not on anticoagulation due to history of GI bleed. -Blood pressure stable.  Continue home metoprolol 50 mg twice daily.  Chronic diastolic congestive heart failure Currently volume overloaded.  Dialysis scheduled for early morning.  Type 2 diabetes Recent A1c 4.2. -Sliding scale insulin sensitive -CBG checks  Hypertension -Continue home beta-blocker  OSA -CPAP  Anemia of chronic disease -Stable.  Hemoglobin 9.4, baseline 8-9.  Physical deconditioning -PT eval  DVT prophylaxis: Subcutaneous heparin Code Status: Patient wishes to be full code. Family Communication: No family available. Disposition Plan: Anticipate discharge to nursing home in 1 to 2 days. Consults called: Nephrology Admission status: It is my clinical opinion that admission to [facility] is reasonable and necessary in this 77 y.o. male . presenting with symptoms of dyspnea, near-syncope, concerning for acute metabolic encephalopathy, acute hypoxemic respiratory failure due to volume overload versus pneumonia . in the context of PMH including: End-stage renal disease on hemodialysis, congestive heart failure . with pertinent positives on physical exam including: Supplemental oxygen requirement . and pertinent positives on  radiographic and laboratory data including: Evidence of pulmonary edema, pneumonia on chest x-ray . Workup and treatment include IV antibiotics.  Anticipate patient will need serial hemodialysis.   Given the aforementioned, the predictability of an adverse outcome is felt to be significant. I expect that the patient will require at least 2 midnights in the hospital to treat this condition.    Shela Leff MD [facility] Pager [phone]  If 7PM-7AM, please contact night-coverage [email or web address] Password [facility]  11/06/2018, 9:43 PM

## 2018-11-07 ENCOUNTER — Other Ambulatory Visit: Payer: Self-pay

## 2018-11-07 ENCOUNTER — Inpatient Hospital Stay (HOSPITAL_COMMUNITY): Payer: Medicare Other

## 2018-11-07 ENCOUNTER — Encounter (HOSPITAL_COMMUNITY): Payer: Self-pay

## 2018-11-07 DIAGNOSIS — J189 Pneumonia, unspecified organism: Secondary | ICD-10-CM

## 2018-11-07 DIAGNOSIS — I480 Paroxysmal atrial fibrillation: Secondary | ICD-10-CM

## 2018-11-07 LAB — RENAL FUNCTION PANEL
Albumin: 2.9 g/dL — ABNORMAL LOW (ref 3.5–5.0)
Anion gap: 13 (ref 5–15)
BUN: 54 mg/dL — ABNORMAL HIGH (ref 8–23)
CO2: 24 mmol/L (ref 22–32)
CREATININE: 7.01 mg/dL — AB (ref 0.61–1.24)
Calcium: 8.9 mg/dL (ref 8.9–10.3)
Chloride: 100 mmol/L (ref 98–111)
GFR, EST AFRICAN AMERICAN: 8 mL/min — AB (ref >60)
GFR, EST NON AFRICAN AMERICAN: 7 mL/min — AB (ref >60)
Glucose, Bld: 116 mg/dL — ABNORMAL HIGH (ref 70–99)
POTASSIUM: 4.4 mmol/L (ref 3.5–5.1)
Phosphorus: 4.9 mg/dL — ABNORMAL HIGH (ref 2.5–4.6)
Sodium: 137 mmol/L (ref 135–145)

## 2018-11-07 LAB — CBC
HCT: 27.4 % — ABNORMAL LOW (ref 39.0–52.0)
HCT: 29.8 % — ABNORMAL LOW (ref 39.0–52.0)
Hemoglobin: 8.5 g/dL — ABNORMAL LOW (ref 13.0–17.0)
Hemoglobin: 8.9 g/dL — ABNORMAL LOW (ref 13.0–17.0)
MCH: 32.3 pg (ref 26.0–34.0)
MCH: 31.3 pg (ref 26.0–34.0)
MCHC: 31 g/dL (ref 30.0–36.0)
MCHC: 29.9 g/dL — ABNORMAL LOW (ref 30.0–36.0)
MCV: 104.2 fL — ABNORMAL HIGH (ref 80.0–100.0)
MCV: 104.9 fL — ABNORMAL HIGH (ref 80.0–100.0)
NRBC: 0 % (ref 0.0–0.2)
Platelets: 164 10*3/uL (ref 150–400)
Platelets: 160 10*3/uL (ref 150–400)
RBC: 2.63 MIL/uL — ABNORMAL LOW (ref 4.22–5.81)
RBC: 2.84 MIL/uL — ABNORMAL LOW (ref 4.22–5.81)
RDW: 17.4 % — ABNORMAL HIGH (ref 11.5–15.5)
RDW: 17.3 % — ABNORMAL HIGH (ref 11.5–15.5)
WBC: 8.4 10*3/uL (ref 4.0–10.5)
WBC: 9.3 10*3/uL (ref 4.0–10.5)
nRBC: 0 % (ref 0.0–0.2)

## 2018-11-07 LAB — BLOOD GAS, ARTERIAL
Acid-Base Excess: 6 mmol/L — ABNORMAL HIGH (ref 0.0–2.0)
BICARBONATE: 30 mmol/L — AB (ref 20.0–28.0)
Drawn by: 511551
O2 Content: 4 L/min
O2 SAT: 98.9 %
PO2 ART: 111 mmHg — AB (ref 83.0–108.0)
Patient temperature: 98.6
pCO2 arterial: 44.2 mmHg (ref 32.0–48.0)
pH, Arterial: 7.447 (ref 7.350–7.450)

## 2018-11-07 LAB — COMPREHENSIVE METABOLIC PANEL
ALT: 27 U/L (ref 0–44)
AST: 26 U/L (ref 15–41)
Albumin: 3 g/dL — ABNORMAL LOW (ref 3.5–5.0)
Alkaline Phosphatase: 198 U/L — ABNORMAL HIGH (ref 38–126)
Anion gap: 9 (ref 5–15)
BUN: 13 mg/dL (ref 8–23)
CO2: 28 mmol/L (ref 22–32)
Calcium: 8.7 mg/dL — ABNORMAL LOW (ref 8.9–10.3)
Chloride: 99 mmol/L (ref 98–111)
Creatinine, Ser: 2.89 mg/dL — ABNORMAL HIGH (ref 0.61–1.24)
GFR calc Af Amer: 22 mL/min — ABNORMAL LOW (ref >60)
GFR calc non Af Amer: 19 mL/min — ABNORMAL LOW (ref >60)
Glucose, Bld: 116 mg/dL — ABNORMAL HIGH (ref 70–99)
Potassium: 3.8 mmol/L (ref 3.5–5.1)
Sodium: 136 mmol/L (ref 135–145)
Total Bilirubin: 1.1 mg/dL (ref 0.3–1.2)
Total Protein: 7 g/dL (ref 6.5–8.1)

## 2018-11-07 LAB — LACTIC ACID, PLASMA: Lactic Acid, Venous: 1.5 mmol/L (ref 0.5–1.9)

## 2018-11-07 LAB — GLUCOSE, CAPILLARY
GLUCOSE-CAPILLARY: 111 mg/dL — AB (ref 70–99)
Glucose-Capillary: 85 mg/dL (ref 70–99)
Glucose-Capillary: 99 mg/dL (ref 70–99)

## 2018-11-07 MED ORDER — VANCOMYCIN HCL IN DEXTROSE 750-5 MG/150ML-% IV SOLN: 750.0000 mg | INTRAVENOUS | Status: DC

## 2018-11-07 MED ORDER — IPRATROPIUM-ALBUTEROL 0.5-2.5 (3) MG/3ML IN SOLN
3.0000 mL | RESPIRATORY_TRACT | Status: DC | PRN
  Administered 2018-11-07 – 2018-11-10 (×2): 3 mL via RESPIRATORY_TRACT
  Filled 2018-11-07 (×2): qty 3

## 2018-11-07 MED ORDER — PENTAFLUOROPROP-TETRAFLUOROETH EX AERO: 1.0000 "application " | INHALATION_SPRAY | CUTANEOUS | Status: DC | PRN

## 2018-11-07 MED ORDER — VANCOMYCIN HCL IN DEXTROSE 750-5 MG/150ML-% IV SOLN
INTRAVENOUS | Status: AC
  Filled 2018-11-07: qty 150

## 2018-11-07 MED ORDER — LIDOCAINE HCL (PF) 1 % IJ SOLN: 5.0000 mL | INTRAMUSCULAR | Status: DC | PRN

## 2018-11-07 MED ORDER — SODIUM CHLORIDE 0.9 % IV SOLN: 100.0000 mL | INTRAVENOUS | Status: DC | PRN

## 2018-11-07 MED ORDER — ALTEPLASE 2 MG IJ SOLR: 2.0000 mg | Freq: Once | INTRAMUSCULAR | Status: DC | PRN

## 2018-11-07 MED ORDER — CHLORHEXIDINE GLUCONATE CLOTH 2 % EX PADS
6.0000 | MEDICATED_PAD | Freq: Every day | CUTANEOUS | Status: DC
  Administered 2018-11-07 – 2018-11-11 (×3): 6 via TOPICAL

## 2018-11-07 MED ORDER — CHLORHEXIDINE GLUCONATE CLOTH 2 % EX PADS
6.0000 | MEDICATED_PAD | Freq: Every day | CUTANEOUS | Status: DC
  Administered 2018-11-08 – 2018-11-10 (×3): 6 via TOPICAL

## 2018-11-07 MED ORDER — LORAZEPAM 2 MG/ML IJ SOLN
0.5000 mg | Freq: Once | INTRAMUSCULAR | Status: AC
  Administered 2018-11-07: 0.5 mg via INTRAVENOUS

## 2018-11-07 MED ORDER — MIDODRINE HCL 5 MG PO TABS: 10.0000 mg | ORAL_TABLET | Freq: Three times a day (TID) | ORAL | Status: AC

## 2018-11-07 MED ORDER — VANCOMYCIN HCL IN DEXTROSE 750-5 MG/150ML-% IV SOLN
750.0000 mg | Freq: Once | INTRAVENOUS | Status: AC
  Administered 2018-11-07: 750 mg via INTRAVENOUS
  Filled 2018-11-07: qty 150

## 2018-11-07 MED ORDER — HEPARIN SODIUM (PORCINE) 1000 UNIT/ML DIALYSIS: 1000.0000 [IU] | INTRAMUSCULAR | Status: DC | PRN

## 2018-11-07 MED ORDER — FLUMAZENIL 0.5 MG/5ML IV SOLN
INTRAVENOUS | Status: AC
  Filled 2018-11-07: qty 5

## 2018-11-07 MED ORDER — LIDOCAINE-PRILOCAINE 2.5-2.5 % EX CREA: 1.0000 "application " | TOPICAL_CREAM | CUTANEOUS | Status: DC | PRN

## 2018-11-07 MED ORDER — LORAZEPAM 2 MG/ML IJ SOLN
INTRAMUSCULAR | Status: AC
  Filled 2018-11-07: qty 1

## 2018-11-07 MED ORDER — FLUMAZENIL 0.5 MG/5ML IV SOLN: 0.2000 mg | Freq: Once | INTRAVENOUS | Status: DC

## 2018-11-07 NOTE — Consult Note (Addendum)
Renal Service Consult Note [name]  [name] 11/07/2018 [name] Requesting Physician:  Dr Starla Link  Reason for Consult:  HPI: The patient is a 77 y.o. year-old with SOB and drowsiness.  Seen in Ed for recurrent resp distress, near syncope.  CXR showed patchy infiltrates PNA vs edema. Pt was admitted, given IV vanc/ cefepime and insulin. LA was 2.94, repeat 1.76.  Asked to see for ESRD.    Pt had HD this am w/ soft BP's low 100's, 500 cc removed.  Post HD says he is breathing "better", but still looks SOB.  Denies any prod cough, fevers , chills.  Was admitted here 2-3 weeks ago w/ acute resp failure due to vol overload vs PNA, treated w/ IV abx. CT chest that admit showed GG changes c/w edema > vs atypical infection.  Pt is poor historian and very somnolent, but arouses and answers questions appropriately.  Denies any recent CP, n/v/d, abd pain.  No leg swelling.    Started HD in 2009.   E-chart: 01/2008 - a/c diast CHF, CKD , htn, OSA, diab neph, DM2 08/2008 - abd pain / biliary colic/ acute chole, CKD, afib / rvr, dm, ams, chole tube placed 10/2008 - a/c diast CHF, esrd on HD, DM2, paf, HTN, OSA  12/2008 - syncope, vol depletion, hypotension, esrd on HD, hx afib, HTN  02/2011 - atypical CP, esrd hd mwf, parox afib, anemia, DM2, HTN  05/2011 - rectal bleed no source id'd, esrd, parox afib, on coumadin  10-10/2018 - acute resp failure vol overload vs pna, CAP, esrd on HD, parox afib   ROS  denies CP  no joint pain   no HA  no blurry vision  no rash  no diarrhea  no nausea/ vomiting    Past Medical History  Past Medical History:  Diagnosis Date  . A-fib ([place])   . Anemia   . Blood transfusion   . BPH (benign prostatic hyperplasia)   . CHF (congestive heart failure) ([place])   . Chronic kidney disease   . CKD (chronic kidney disease)   . Diarrhea   . DM (diabetes mellitus) ([place])   . History of GI bleed    secondary to coumadin  . HTN  (hypertension)   . Hyperlipidemia   . OSA (obstructive sleep apnea)    uses CPAP  . Secondary hyperparathyroidism of renal origin [facility])    Past Surgical History  Past Surgical History:  Procedure Laterality Date  . BVT  7/82/95   Left  Basilic Vein Transposition  . CHOLECYSTECTOMY    . EYE SURGERY     Catarct bil  . INSERTION OF DIALYSIS CATHETER  05/28/2012   Procedure: INSERTION OF DIALYSIS CATHETER;  Surgeon: Mal Misty, MD;  Location: [facility];  Service: Vascular;  Laterality: Right;  . Left arm shuntogram.    . Left forearm loop graft with 6 mm Gore-Tex graft.    . Pars plana vitrectomy with 25-gauge system     Family History  Family History  Problem Relation Age of Onset  . Alzheimer's disease Mother   . Diabetes Father        Amputation:  bilateral legs  . Cancer Daughter        breast cancer  . Diabetes Son   . Heart disease Son        before age 84  . Hypertension Son   . Anesthesia problems Neg Hx    Social History  reports  that he quit smoking about 14 years ago. His smoking use included cigarettes. He has never used smokeless tobacco. He reports that he does not drink alcohol or use drugs. Allergies  Allergies  Allergen Reactions  . Tape Itching and Other (See Comments)    Cloth tape only   Home medications Prior to Admission medications   Medication Sig Start Date End Date Taking? Authorizing Provider  acetaminophen (TYLENOL) 500 MG tablet Take 1,000 mg by mouth 3 (three) times daily.   Yes [provider]  aspirin EC 325 MG tablet Take 1 tablet (325 mg total) by mouth daily. 02/14/15  Yes Nahser, Wonda Cheng, MD  calcium acetate (PHOSLO) 667 MG capsule Take 2,668 mg by mouth 3 (three) times daily with meals.  02/03/11  Yes [provider]  metoprolol tartrate (LOPRESSOR) 50 MG tablet Take 1 tablet (50 mg total) by mouth 2 (two) times daily. 10/29/18  Yes Bonnielee Haff, MD  hydrOXYzine (ATARAX/VISTARIL) 25 MG tablet Take 25 mg by mouth every 8  (eight) hours as needed for itching.    [provider]   Liver Function Tests Recent Labs  Lab 11/02/18 0852 11/07/18 0848  ALBUMIN 2.8* 2.9*   No results for input(s): LIPASE, AMYLASE in the last 168 hours. CBC Recent Labs  Lab 11/02/18 0852 11/06/18 1855 11/06/18 1920 11/07/18 0847  WBC 7.7 9.8  --  8.4  HGB 8.7* 9.4* 10.2* 8.5*  HCT 28.3* 30.7* 30.0* 27.4*  MCV 104.8* 106.2*  --  104.2*  PLT 144* 173  --  440   Basic Metabolic Panel Recent Labs  Lab 11/01/18 0732 11/02/18 0852 11/06/18 1855 11/06/18 1920 11/07/18 0848  NA 136 137 137 136 137  K 5.0 5.0 4.1 4.1 4.4  CL 99 100 97* 99 100  CO2 26 24 22   --  24  GLUCOSE 105* 80 145* 143* 116*  BUN 32* 54* 48* 50* 54*  CREATININE 6.00* 7.72* 6.36* 6.90* 7.01*  CALCIUM 9.2 9.1 9.4  --  8.9  PHOS  --  3.9  --   --  4.9*   Iron/TIBC/Ferritin/ %Sat    Component Value Date/Time   IRON 48 06/05/2011 0844   TIBC 194 (L) 06/05/2011 0844   FERRITIN 872 (H) 06/05/2011 0844   IRONPCTSAT 25 06/05/2011 0844    Vitals:   11/07/18 0900 11/07/18 0930 11/07/18 1000 11/07/18 1030  BP: (!) 91/56 (!) 104/54 (!) 97/58 (!) 90/53  Pulse: 80 84 69 75  Resp:      Temp:      TempSrc:      SpO2:      Weight:      Height:       Exam Gen somnolent, easily awakens, dry cough, some ^wob off and on No rash, cyanosis or gangrene Sclera anicteric, throat clear  No jvd or bruits Chest crackles R side ant/ post, L clear Cor irreg irreg w harsh SEM 2-3/ 6, no diast M , no RG Abd soft ntnd no mass or ascites +bs GU normal male MS no joint effusions or deformity Ext no sig LE or UE edema, no wounds or ulcers Neuro is drowsy, Ox 3, nonfocal LUA AVF+bruit    Home meds:  - aspirin 325 qd/ calcium acetate 3 tid ac  - metoprolol tartrate 50 bid  - prns   Dialysis: [facility] MWF  4h  70kg  LUA AVF  2/2.5 bath  Hep 2500    - hect 2 ug tiw  -  mircera 100 ug due today 11/11  - venofer 50 /wk q wed     Impression/  Plan: 1. SOB - recurrent issue, was just dc'd 4-5 days ago after admit for SOB/ pulm infiltrates. Had no fever/ wbc then or now. CXR here poor quality , will try to repeat as 2-view.  Suspect pt vol overloaded, plan 2nd Hd today.  2. AMS/ somnolence - not sure etiology, no sedating meds noted, lytes are OK, nonfocal 3. ESRD - had HD today but min UF, plan another HD this afternoon. See orders.  4. HTN - hold metoprolol, let bp's come up 5. MBD ckd - cont meds 6. Anemia ckd - esa due this week, will give w/ next HD 7. Parox afib 8. OSA 9. Hx DM2 - not on any medication now     Kelly Splinter MD [facility] pager [phone]   11/07/2018, 12:13 PM

## 2018-11-07 NOTE — Evaluation (Signed)
Physical Therapy Evaluation Patient Details Name: [name] MRN: [id number] DOB: Oct 17, 1941 Today's Date: 11/07/2018   History of Present Illness  77 year old male with history of paroxysmal atrial fibrillation, CHF, end-stage renal disease on dialysis, diabetes, hypertension, hyperlipidemia, OSA with recent admission to the hospital on October 20, 2018 and discharged on November 01, 2018 for hypoxia due to volume overload versus pneumonia treated with antibiotics and subsequently was discharged to nursing home presented on 11/06/2018 with shortness of breath and near syncope.    Clinical Impression  Patient presents with decreased mobility due to weakness, decreased activity tolerance with orthostatic BP's, decreased balance and will benefit from skilled PT in the acute setting to maximize mobility and independence prior to d/c back to SNF level rehab.     Follow Up Recommendations SNF;Supervision/Assistance - 24 hour    Equipment Recommendations  Other (comment)(TBA)    Recommendations for Other Services       Precautions / Restrictions Precautions Precautions: Fall;Other (comment)      Mobility  Bed Mobility Overal bed mobility: Needs Assistance Bed Mobility: Supine to Sit;Sit to Supine Rolling: Min assist   Supine to sit: Min assist     General bed mobility comments: assist to lift trunk, to supine assist for feet in bed, positioning  Transfers Overall transfer level: Needs assistance Equipment used: None Transfers: Sit to/from Stand Sit to Stand: Min assist            Ambulation/Gait Ambulation/Gait assistance: Min assist Gait Distance (Feet): 1 Feet Assistive device: (bed rail) Gait Pattern/deviations: Step-to pattern     General Gait Details: side steps to [facility]  Stairs            Wheelchair Mobility    Modified Rankin (Stroke Patients Only)       Balance Overall balance assessment: Needs assistance Sitting-balance support: Feet  supported;No upper extremity supported Sitting balance-Leahy Scale: Good     Standing balance support: Single extremity supported;During functional activity;Bilateral upper extremity supported Standing balance-Leahy Scale: Poor Standing balance comment: Reliant on UE support                             Pertinent Vitals/Pain Faces Pain Scale: No hurt    Home Living Family/patient expects to be discharged to:: Assisted living Living Arrangements: Children               Additional Comments: Per chart, pt lived at [employer] living     Prior Function Level of Independence: Independent with assistive device(s)               Hand Dominance        Extremity/Trunk Assessment   Upper Extremity Assessment Upper Extremity Assessment: Generalized weakness    Lower Extremity Assessment Lower Extremity Assessment: Generalized weakness    Cervical / Trunk Assessment Cervical / Trunk Assessment: Normal  Communication   Communication: No difficulties  Cognition Arousal/Alertness: Lethargic Behavior During Therapy: Flat affect Overall Cognitive Status: No family/caregiver present to determine baseline cognitive functioning Area of Impairment: Memory;Problem solving;Safety/judgement;Orientation                 Orientation Level: Disoriented to;Time   Memory: Decreased short-term memory   Safety/Judgement: Decreased awareness of safety   Problem Solving: Slow processing;Decreased initiation General Comments: recalling home info was incorrect as was at SNF and ALF prior to that, but reported from home      General Comments General comments (skin  integrity, edema, etc.): BP dropped in sitting and pt lethargic, SOB so limited to EOB and short standing to step to [facility]    Exercises     Assessment/Plan    PT Assessment Patient needs continued PT services  PT Problem List Decreased strength;Decreased mobility;Decreased safety awareness;Decreased  activity tolerance;Decreased balance;Decreased knowledge of use of DME;Cardiopulmonary status limiting activity;Decreased knowledge of precautions       PT Treatment Interventions DME instruction;Therapeutic activities;Gait training;Therapeutic exercise;Patient/family education;Stair training;Balance training;Functional mobility training;Neuromuscular re-education    PT Goals (Current goals can be found in the Care Plan section)  Acute Rehab PT Goals Patient Stated Goal: none stated PT Goal Formulation: Patient unable to participate in goal setting Time For Goal Achievement: 11/21/18 Potential to Achieve Goals: Fair    Frequency Min 2X/week   Barriers to discharge        Co-evaluation               AM-PAC PT "6 Clicks" Daily Activity  Outcome Measure Difficulty turning over in bed (including adjusting bedclothes, sheets and blankets)?: Unable Difficulty moving from lying on back to sitting on the side of the bed? : Unable Difficulty sitting down on and standing up from a chair with arms (e.g., wheelchair, bedside commode, etc,.)?: Unable Help needed moving to and from a bed to chair (including a wheelchair)?: A Little Help needed walking in hospital room?: A Lot Help needed climbing 3-5 steps with a railing? : Total 6 Click Score: 9    End of Session Equipment Utilized During Treatment: Gait belt Activity Tolerance: Patient limited by lethargy;Other (comment)(low BP) Patient left: in bed;with call bell/phone within reach;with bed alarm set Nurse Communication: Mobility status PT Visit Diagnosis: Muscle weakness (generalized) (M62.81);Other abnormalities of gait and mobility (R26.89);Other symptoms and signs involving the nervous system (R29.898)    Time: 1400-1420 PT Time Calculation (min) (ACUTE ONLY): 20 min   Charges:   PT Evaluation $PT Eval Moderate Complexity: [place], [place] Acute Rehabilitation  Services [phone] 11/07/2018   Reginia Naas 11/07/2018, 3:44 PM

## 2018-11-07 NOTE — ED Notes (Signed)
CALLED PHARMACY TO SEND VANC

## 2018-11-07 NOTE — Progress Notes (Signed)
Follow up on day shift rapid response call.  ABG-7.44/44.2/111/30. PCXR-Cardiomegaly. Pulmonary edema vs diffuse pneumonia. Pt less wheezy s/p duoneb. Pt still having periods of apnea, responding to physical stimuli, following commands with repeated stimuli.  NP Bodenheimer updated on findings. Pt placed on bipap. CBC/CMP/Lactic acid ordered.   Discussed plan of care with bedside RN. Will continue to follow-up on patient.

## 2018-11-07 NOTE — Progress Notes (Signed)
Paged Rapid response, pt sats 75%, very hard to arouse with sternal rub, non rebreather applied with no increase in sats, bagged pt to increase sats to 96%, waiting for call from MD notified via text that pt returned from dialysis, had Ativan at approximately 1600 removed 3L of fluid off. Called MD on call for nights, new orders rec'd for ABG, bipap, duoneb due to rhonchi,

## 2018-11-07 NOTE — Progress Notes (Signed)
Patient ID: [name], male   DOB: 1941/05/20, 77 y.o.   MRN: [id number]  PROGRESS NOTE    TRINTON PREWITT  VEH:[id number] DOB: [birth year] DOA: 11/06/2018 PCP: No primary care provider on file.   Brief Narrative:  77 year old male with history of paroxysmal atrial fibrillation, CHF, end-stage renal disease on dialysis, diabetes, hypertension, hyperlipidemia, OSA with recent admission to the hospital on October 20, 2018 and discharged on November 01, 2018 for hypoxia due to volume overload versus pneumonia treated with antibiotics and subsequently was discharged to nursing home presented on 11/06/2018 with shortness of breath and near syncope.  Chest x-ray showed patchy opacities in the lungs.  He was started on broad-spectrum antibiotics.  Nephrology was consulted.   Assessment & Plan:   Principal Problem:   Acute hypoxemic respiratory failure (HCC) Active Problems:   DM (diabetes mellitus) (HCC)   OBSTRUCTIVE SLEEP APNEA   HYPERTENSION, BENIGN   PAF (paroxysmal atrial fibrillation) (HCC)   End stage renal disease (HCC)   Acute encephalopathy   Chronic anemia   Volume overload   Multifocal pneumonia   Acute metabolic encephalopathy   Physical deconditioning   Acute hypoxic hypercapnic respiratory failure -Probably secondary to pneumonia versus fluid overload -Currently requiring 2 L oxygen via nasal cannula.  Wean as able.  Incentive spirometry  Probable multifocal healthcare associated pneumonia -Patient recently was in the hospital and had been treated for pneumonia with antibiotics for 7 days -Unclear if current presentation is secondary to another episode of pneumonia or fluid overload -Continue broad-spectrum antibiotics for now.  Follow cultures  Acute metabolic encephalopathy -Probably secondary to hypoxia -Monitor mental status -Improving  End-stage renal disease on hemodialysis -Undergoing hemodialysis this morning.  Nephrology following.  Paroxysmal atrial  fibrillation -Not on anticoagulant because of history of GI bleed  Chronic diastolic heart failure -volume managed by dialysis  There is mellitus type II -Recent A1c 4.2 -Continue sliding scale insulin  Hypertension -Continue home beta-blocker. Monitor BP  OSA - continue CPAP  Anemia of chronic disease -Stable.  Monitor  Physical deconditioning -PT eval    DVT prophylaxis: Subcutaneous heparin Code Status: Full code  family Communication: None at bedside Disposition Plan: Nursing home in 1 to 2 days once clinically stable  Consultants: Nephrology  Procedures: None  Antimicrobials: Cefepime and vancomycin   Subjective: Patient seen and examined at bedside undergoing dialysis.  He is more awake and feels a little better.  No overnight fever, nausea or vomiting.  He is a poor historian.  Objective: Vitals:   11/07/18 0900 11/07/18 0930 11/07/18 1000 11/07/18 1030  BP: (!) 91/56 (!) 104/54 (!) 97/58 (!) 90/53  Pulse: 80 84 69 75  Resp:      Temp:      TempSrc:      SpO2:      Weight:      Height:        Intake/Output Summary (Last 24 hours) at 11/07/2018 1123 Last data filed at 11/07/2018 1000 Gross per 24 hour  Intake 220 ml  Output 1 ml  Net 219 ml   Filed Weights   11/07/18 0320 11/07/18 0757  Weight: 73.2 kg 72 kg    Examination:  General exam: Appears calm and comfortable.  Poor historian Respiratory system: Bilateral decreased breath sounds at bases Cardiovascular system: S1 & S2 heard, Rate controlled Gastrointestinal system: Abdomen is nondistended, soft and nontender. Normal bowel sounds heard. Extremities: No cyanosis; trace edema      Data Reviewed: I  have personally reviewed following labs and imaging studies  CBC: Recent Labs  Lab 11/02/18 0852 11/06/18 1855 11/06/18 1920 11/07/18 0847  WBC 7.7 9.8  --  8.4  HGB 8.7* 9.4* 10.2* 8.5*  HCT 28.3* 30.7* 30.0* 27.4*  MCV 104.8* 106.2*  --  104.2*  PLT 144* 173  --  390    Basic Metabolic Panel: Recent Labs  Lab 11/01/18 0732 11/02/18 0852 11/06/18 1855 11/06/18 1920 11/07/18 0848  NA 136 137 137 136 137  K 5.0 5.0 4.1 4.1 4.4  CL 99 100 97* 99 100  CO2 26 24 22   --  24  GLUCOSE 105* 80 145* 143* 116*  BUN 32* 54* 48* 50* 54*  CREATININE 6.00* 7.72* 6.36* 6.90* 7.01*  CALCIUM 9.2 9.1 9.4  --  8.9  PHOS  --  3.9  --   --  4.9*   GFR: Estimated Creatinine Clearance: 7.8 mL/min (A) (by C-G formula based on SCr of 7.01 mg/dL (H)). Liver Function Tests: Recent Labs  Lab 11/02/18 0852 11/07/18 0848  ALBUMIN 2.8* 2.9*   No results for input(s): LIPASE, AMYLASE in the last 168 hours. Recent Labs  Lab 11/06/18 1850  AMMONIA 40*   Coagulation Profile: No results for input(s): INR, PROTIME in the last 168 hours. Cardiac Enzymes: Recent Labs  Lab 11/06/18 1855  TROPONINI <0.03   BNP (last 3 results) No results for input(s): PROBNP in the last 8760 hours. HbA1C: No results for input(s): HGBA1C in the last 72 hours. CBG: Recent Labs  Lab 11/01/18 2129 11/02/18 0758 11/02/18 1250 11/02/18 1346 11/06/18 1904  GLUCAP 108* 107* 102* 92 154*   Lipid Profile: No results for input(s): CHOL, HDL, LDLCALC, TRIG, CHOLHDL, LDLDIRECT in the last 72 hours. Thyroid Function Tests: No results for input(s): TSH, T4TOTAL, FREET4, T3FREE, THYROIDAB in the last 72 hours. Anemia Panel: No results for input(s): VITAMINB12, FOLATE, FERRITIN, TIBC, IRON, RETICCTPCT in the last 72 hours. Sepsis Labs: Recent Labs  Lab 11/06/18 1919 11/06/18 2057 11/06/18 2102  PROCALCITON  --  0.48  --   LATICACIDVEN 2.94*  --  1.76    Recent Results (from the past 240 hour(s))  Blood culture (routine x 2)     Status: None (Preliminary result)   Collection Time: 11/06/18  8:30 PM  Result Value Ref Range Status   Specimen Description BLOOD RIGHT ARM  Final   Special Requests   Final    BOTTLES DRAWN AEROBIC ONLY Blood Culture adequate volume   Culture   Final     NO GROWTH < 12 HOURS Performed at [facility], [address] [place], [place] [address]    Report Status PENDING  Incomplete  Blood culture (routine x 2)     Status: None (Preliminary result)   Collection Time: 11/06/18  8:50 PM  Result Value Ref Range Status   Specimen Description BLOOD RIGHT HAND  Final   Special Requests   Final    BOTTLES DRAWN AEROBIC AND ANAEROBIC Blood Culture adequate volume   Culture   Final    NO GROWTH < 12 HOURS Performed at [facility], [place] [address], [place], [place] [address]    Report Status PENDING  Incomplete         Radiology Studies: Dg Chest Portable 1 View  Result Date: 11/06/2018 CLINICAL DATA:  Syncope. EXAM: PORTABLE CHEST 1 VIEW COMPARISON:  October 28, 2018 FINDINGS: The heart size remains mildly enlarged. No pneumothorax. No nodules or  masses. Patchy opacities in the lungs, particularly the right base. No other acute abnormalities. IMPRESSION: Patchy opacities in the lungs may represent multifocal pneumonia versus asymmetric edema. Recommend clinical correlation. Electronically Signed   By: Dorise Bullion III M.D   On: 11/06/2018 20:00        Scheduled Meds: . aspirin EC  325 mg Oral Daily  . calcium acetate  2,668 mg Oral TID WC  . Chlorhexidine Gluconate Cloth  6 each Topical Q0600  . heparin  5,000 Units Subcutaneous Q8H  . insulin aspart  0-9 Units Subcutaneous TID WC  . metoprolol tartrate  50 mg Oral BID  . vancomycin variable dose per unstable renal function (pharmacist dosing)   Does not apply See admin instructions   Continuous Infusions: . sodium chloride    . sodium chloride    . ceFEPime (MAXIPIME) IV       LOS: 1 day        Aline August, MD [facility] Pager [phone]  If 7PM-7AM, please contact night-coverage [email or web address] Password [email or web address] 11/07/2018, 11:23 AM

## 2018-11-07 NOTE — ED Notes (Signed)
Pt pulled out IV. 2 Attempts to restart unsuccessful. Paged IV team

## 2018-11-07 NOTE — Progress Notes (Signed)
PT Cancellation Note  Patient Details Name: [name] MRN: [id number] DOB: 1941-06-20   Cancelled Treatment:    Reason Eval/Treat Not Completed: Patient at procedure or test/unavailable(pt in HD)   Shary Decamp [facility] 11/07/2018, 9:02 AM [place] Pager [phone] Office [phone]

## 2018-11-07 NOTE — Progress Notes (Signed)
  RT Note:  Patient refused CPAP.  Does not wear at home. Encouraged patient to call for Respiratory if he would like to wear CPAP during his stay.

## 2018-11-07 NOTE — Significant Event (Signed)
Rapid Response Event Note  Overview:  Called for decreased O2 sats Time Called: 1845 Arrival Time: 1848 Event Type: Respiratory  Initial Focused Assessment:  Arouses to loud voice - moves all extremities - but sleepy - warm and dry - resps rapid with some short periods of apnea followed by deep breaths.  Audible exp wheezing heard.  Bil BS with fine crackles.  O2 sats 94-100 on 4 liters nasal cannula.  RN Archie Patten at bedside reports he desaturated to 75% with apnea - had 0.5mg  Ativan IV at 1600 in HD - had 1.5 hours HD post Ativan.  BP stable.  Just had PA and lat PCXR.  RN reports 1.5 liters removed this afternoon in HD.  CBG 99.  Chart reviewed - admitted with AMS - possible recurrent PNA.    Interventions: Arby Barrette with [facility] returned call.  Stat ABG - Bipap to bedside.  Duoneb given after ABG.  Periods of confusion and picking at the air - continues to arouse to loud voice.    Plan of Care (if not transferred):  Handoff to [place] RN - PM RRT RN.  Plan of care TBD.  Event Summary: Name of Physician Notified: [place] paged - Arby Barrette returned call at [facility])    at       Event End Time: (handoff to PM RRT RN [place] )  [name], [name]

## 2018-11-07 NOTE — ED Notes (Signed)
Pt had a significant bowel movement. Pt and bed cleaned.

## 2018-11-07 NOTE — Plan of Care (Signed)

## 2018-11-07 NOTE — Procedures (Signed)
   I was present at this dialysis session, have reviewed the session itself and made  appropriate changes Kelly Splinter MD [place] pager [phone]   11/07/2018, 2:19 PM

## 2018-11-07 NOTE — ED Notes (Signed)
Entered pt room, pt had removed all monitoring equipment and oxygen. SPO2 85% RA Refuses to wear ekg leads. Replaced ekg leads but began removing them.  Stated he will wear Pulse ox AND o2 since oxygen is low. Will cont to monitor. SPO2 97% 3LNC

## 2018-11-08 ENCOUNTER — Inpatient Hospital Stay (HOSPITAL_COMMUNITY): Payer: Medicare Other

## 2018-11-08 DIAGNOSIS — D649 Anemia, unspecified: Secondary | ICD-10-CM

## 2018-11-08 DIAGNOSIS — I361 Nonrheumatic tricuspid (valve) insufficiency: Secondary | ICD-10-CM

## 2018-11-08 LAB — GLUCOSE, CAPILLARY
GLUCOSE-CAPILLARY: 84 mg/dL (ref 70–99)
GLUCOSE-CAPILLARY: 128 mg/dL — AB (ref 70–99)
Glucose-Capillary: 67 mg/dL — ABNORMAL LOW (ref 70–99)
Glucose-Capillary: 140 mg/dL — ABNORMAL HIGH (ref 70–99)

## 2018-11-08 LAB — ECHOCARDIOGRAM COMPLETE
Height: 66 in
Weight: 2529.12 oz

## 2018-11-08 LAB — BASIC METABOLIC PANEL
Anion gap: 12 (ref 5–15)
BUN: 15 mg/dL (ref 8–23)
CHLORIDE: 100 mmol/L (ref 98–111)
CO2: 26 mmol/L (ref 22–32)
Calcium: 8.8 mg/dL — ABNORMAL LOW (ref 8.9–10.3)
Creatinine, Ser: 3.19 mg/dL — ABNORMAL HIGH (ref 0.61–1.24)
GFR calc Af Amer: 20 mL/min — ABNORMAL LOW (ref >60)
GFR calc non Af Amer: 17 mL/min — ABNORMAL LOW (ref >60)
GLUCOSE: 105 mg/dL — AB (ref 70–99)
Potassium: 3.8 mmol/L (ref 3.5–5.1)
Sodium: 138 mmol/L (ref 135–145)

## 2018-11-08 LAB — CBC WITH DIFFERENTIAL/PLATELET
ABS IMMATURE GRANULOCYTES: 0.06 10*3/uL (ref 0.00–0.07)
Basophils Absolute: 0.1 10*3/uL (ref 0.0–0.1)
Basophils Relative: 1 %
EOS PCT: 4 %
Eosinophils Absolute: 0.3 10*3/uL (ref 0.0–0.5)
HEMATOCRIT: 27.2 % — AB (ref 39.0–52.0)
Hemoglobin: 8.5 g/dL — ABNORMAL LOW (ref 13.0–17.0)
Immature Granulocytes: 1 %
LYMPHS ABS: 0.9 10*3/uL (ref 0.7–4.0)
LYMPHS PCT: 12 %
MCH: 32.4 pg (ref 26.0–34.0)
MCHC: 31.3 g/dL (ref 30.0–36.0)
MCV: 103.8 fL — ABNORMAL HIGH (ref 80.0–100.0)
MONO ABS: 1.1 10*3/uL — AB (ref 0.1–1.0)
Monocytes Relative: 13 %
NEUTROS ABS: 5.6 10*3/uL (ref 1.7–7.7)
Neutrophils Relative %: 69 %
Platelets: 147 10*3/uL — ABNORMAL LOW (ref 150–400)
RBC: 2.62 MIL/uL — ABNORMAL LOW (ref 4.22–5.81)
RDW: 17.2 % — ABNORMAL HIGH (ref 11.5–15.5)
WBC: 8 10*3/uL (ref 4.0–10.5)
nRBC: 0 % (ref 0.0–0.2)

## 2018-11-08 LAB — LACTIC ACID, PLASMA: Lactic Acid, Venous: 1.2 mmol/L (ref 0.5–1.9)

## 2018-11-08 LAB — MAGNESIUM: MAGNESIUM: 2 mg/dL (ref 1.7–2.4)

## 2018-11-08 MED ORDER — CHLORHEXIDINE GLUCONATE CLOTH 2 % EX PADS: 6.0000 | MEDICATED_PAD | Freq: Every day | CUTANEOUS | Status: DC

## 2018-11-08 NOTE — Progress Notes (Signed)
[facility] Progress Note  Subjective: no new c/o's, doesn't remember all the events of yesterday  Vitals:   11/08/18 0345 11/08/18 0438 11/08/18 0759 11/08/18 1131  BP:  (!) 96/58 105/62 92/75  Pulse: 86 83 98 83  Resp: 17  19 20   Temp:  98.3 F (36.8 C) 98 F (36.7 C) 99 F (37.2 C)  TempSrc:  Oral Oral Oral  SpO2:  100% 99%   Weight:      Height:        Inpatient medications: . aspirin EC  325 mg Oral Daily  . calcium acetate  2,668 mg Oral TID WC  . Chlorhexidine Gluconate Cloth  6 each Topical Q0600  . Chlorhexidine Gluconate Cloth  6 each Topical Q0600  . heparin  5,000 Units Subcutaneous Q8H  . insulin aspart  0-9 Units Subcutaneous TID WC   . ceFEPime (MAXIPIME) IV Stopped (11/07/18 2218)   acetaminophen, alteplase, heparin, hydrOXYzine, ipratropium-albuterol  Iron/TIBC/Ferritin/ %Sat    Component Value Date/Time   IRON 48 06/05/2011 0844   TIBC 194 (L) 06/05/2011 0844   FERRITIN 872 (H) 06/05/2011 0844   IRONPCTSAT 25 06/05/2011 0844    Exam: Gen breathing easy today, still confused and somnolent , falling asleep but is more engaged in conversation No jvd or bruits Chest clear bilat Cor irreg irreg w harsh SEM 2-3/ 6, no diast M , no RG Abd soft ntnd no mass or ascites +bs Ext no sig LE edema Neuro is drowsy, Ox 2, nonfocal LUA AVF+bruit    Home meds:  - aspirin 325 qd/ calcium acetate 3 tid ac  - metoprolol tartrate 50 bid  - prns   Dialysis: [facility] MWF  4h  70kg  LUA AVF  2/2.5 bath  Hep 2500    - hect 2 ug tiw  - mircera 100 ug due today 11/11  - venofer 50 /wk q wed     Impression/ Plan: 1. SOB - recurrent issue, was just dc'd 4-5 days ago for similar.  No fevers and no ^WBC.  Suspect this is due to vol overload. Resp status much better after HD yest (3.3 L off total).  Would suggest may not need antibiotics.  Further vol lowering tomorrow w/ HD and establish new dry wt if possible.  2. AMS / somnolence -  more with it today, still not Ox 3 though 3. ESRD - MWF. HD tomorrow 4. HTN - hold metoprolol, let bp's come up 5. MBD ckd - cont meds 6. Anemia ckd - esa due this week, will give w/ next HD 7. Parox afib 8. OSA 9. Hx DM2 - not on any medication now     Kelly Splinter MD [place] pager [phone]   11/08/2018, 12:49 PM   Recent Labs  Lab 11/02/18 0852  11/07/18 0848 11/07/18 2046 11/08/18 0016  NA 137   < > 137 136 138  K 5.0   < > 4.4 3.8 3.8  CL 100   < > 100 99 100  CO2 24   < > 24 28 26   GLUCOSE 80   < > 116* 116* 105*  BUN 54*   < > 54* 13 15  CREATININE 7.72*   < > 7.01* 2.89* 3.19*  CALCIUM 9.1   < > 8.9 8.7* 8.8*  PHOS 3.9  --  4.9*  --   --   ALBUMIN 2.8*  --  2.9* 3.0*  --    < > = values in  this interval not displayed.   Recent Labs  Lab 11/07/18 2046  AST 26  ALT 27  ALKPHOS 198*  BILITOT 1.1  PROT 7.0   Recent Labs  Lab 11/07/18 2046 11/08/18 0016  WBC 9.3 8.0  NEUTROABS  --  5.6  HGB 8.9* 8.5*  HCT 29.8* 27.2*  MCV 104.9* 103.8*  PLT 160 147*

## 2018-11-08 NOTE — Progress Notes (Signed)
PT Cancellation Note  Patient Details Name: [name] MRN: [id number] DOB: 22-May-1941   Cancelled Treatment:    Reason Eval/Treat Not Completed: Patient at procedure or test/unavailable.   Duncan Dull 11/08/2018, 1:58 PM

## 2018-11-08 NOTE — Progress Notes (Signed)
Pharmacy Antibiotic Note  [name] is a 77 y.o. male admitted on 11/06/2018 with pneumonia.  Pharmacy has been consulted for vancomycin and cefepime dosing. Pt has ESRD on HD MWF.   Patient currently afebrile   Last Vancomycin received with HD session on 11/11. Next HD session scheduled for tomorrow 11/13.   Plan: -Discontinuing vancomycin  -Continue cefepime 1g IV q24h -Continue to monitor HD schedule and patient clinical status.   Height: 5\' 6"  (167.6 cm) Weight: 158 lb 1.1 oz (71.7 kg) IBW/kg (Calculated) : 63.8  Temp (24hrs), Avg:98.2 F (36.8 C), Min:97.6 F (36.4 C), Max:99 F (37.2 C)  Recent Labs  Lab 11/02/18 0852 11/06/18 1855 11/06/18 1919 11/06/18 1920 11/06/18 2102 11/07/18 0847 11/07/18 0848 11/07/18 2046 11/08/18 0016  WBC 7.7 9.8  --   --   --  8.4  --  9.3 8.0  CREATININE 7.72* 6.36*  --  6.90*  --   --  7.01* 2.89* 3.19*  LATICACIDVEN  --   --  2.94*  --  1.76  --   --  1.5 1.2    Estimated Creatinine Clearance: 17.2 mL/min (A) (by C-G formula based on SCr of 3.19 mg/dL (H)).    Allergies  Allergen Reactions  . Tape Itching and Other (See Comments)    Cloth tape only    Antimicrobials this admission: Vancomycin 11/10 >> Cefepime 11/10 >>  Dose adjustments this admission: none  Microbiology results: Blood cx 11/10: ngtd x2 days  Thank you for allowing pharmacy to be a part of this patient's care.  Gwenlyn Found, Sherian Rein D PGY1 Pharmacy Resident  Phone [phone] 11/08/2018   1:26 PM

## 2018-11-08 NOTE — Progress Notes (Addendum)
  Echocardiogram 2D Echocardiogram has been performed.  Patient extremely difficult due to inability to remain still during exam. Patient continued to move techs hand out of way.    [name] 11/08/2018, 2:37 PM

## 2018-11-08 NOTE — Progress Notes (Signed)
Pt was placed on CPAP of 10 cmh02 by this RT at 2235.  RT come to check on pt and pt was back Bipap settings. There is no documentation about the changes..  Pt left on documented Bipap settings. Mask secured and NAD noted. Will continue to monitor.

## 2018-11-08 NOTE — Progress Notes (Signed)
Patient ID: [name], male   DOB: July 12, 1941, 77 y.o.   MRN: [id number]  PROGRESS NOTE    [name]  WUJ:[id number] DOB: 12-Dec-1941 DOA: 11/06/2018 PCP: No primary care provider on file.   Brief Narrative:  77 year old male with history of paroxysmal atrial fibrillation, CHF, end-stage renal disease on dialysis, diabetes, hypertension, hyperlipidemia, OSA with recent admission to the hospital on October 20, 2018 and discharged on November 01, 2018 for hypoxia due to volume overload versus pneumonia treated with antibiotics and subsequently was discharged to nursing home presented on 11/06/2018 with shortness of breath and near syncope.  Chest x-ray showed patchy opacities in the lungs.  He was started on broad-spectrum antibiotics.  Nephrology was consulted.   Assessment & Plan:   Principal Problem:   Acute hypoxemic respiratory failure (HCC) Active Problems:   DM (diabetes mellitus) (HCC)   OBSTRUCTIVE SLEEP APNEA   HYPERTENSION, BENIGN   PAF (paroxysmal atrial fibrillation) (HCC)   End stage renal disease (HCC)   Acute encephalopathy   Chronic anemia   Volume overload   Multifocal pneumonia   Acute metabolic encephalopathy   Physical deconditioning   Acute hypoxic hypercapnic respiratory failure -Probably secondary to pneumonia versus fluid overload -Resolved.  Currently on room air.  Continue incentive spirometry  Probable multifocal healthcare associated pneumonia -Patient recently was in the hospital and had been treated for pneumonia with antibiotics for 7 days -Unclear if current presentation is secondary to another episode of pneumonia or fluid overload.  We will get 2D echo -Currently on cefepime and vancomycin.  Will discontinue vancomycin.  Cultures negative so far  Acute metabolic and toxic encephalopathy -Patient's altered mental status was probably secondary to hypoxia on presentation -Patient was very altered yesterday evening again after having  received Ativan during dialysis in the afternoon.  He was hypoxic and very drowsy and had to be put on BiPAP for a few hours.  Mental status much better this morning although he is slightly confused. -Monitor -We will request palliative care consult for goals of care  End-stage renal disease on hemodialysis -Dialysis as per nephrology.  Paroxysmal atrial fibrillation -Not on anticoagulant because of history of GI bleed  Chronic diastolic heart failure -volume managed by dialysis  Diabetes mellitus type II -Recent A1c 4.2 -Continue sliding scale insulin  Hypertension -Blood pressure on the lower side.  Beta-blocker held.  OSA - continue CPAP  Anemia of chronic disease -Stable.  Monitor  Physical deconditioning -PT eval    DVT prophylaxis: Subcutaneous heparin Code Status: Full code  family Communication: None at bedside Disposition Plan: Nursing home in 1 to 2 days once clinically stable  Consultants: Nephrology  Procedures: None  Antimicrobials: Cefepime and vancomycin from 11/06/2018 onwards   Subjective: Patient seen and examined at bedside.  He is awake this morning but does not remember what happened last evening.  He is currently in room air.  He denies worsening chest pain or shortness of breath.  Objective: Vitals:   11/07/18 2300 11/08/18 0345 11/08/18 0438 11/08/18 0759  BP: 109/66  (!) 96/58 105/62  Pulse: 81 86 83 98  Resp: 17 17  19   Temp: 98 F (36.7 C)  98.3 F (36.8 C) 98 F (36.7 C)  TempSrc: Axillary  Oral Oral  SpO2:   100% 99%  Weight:      Height:        Intake/Output Summary (Last 24 hours) at 11/08/2018 0941 Last data filed at 11/08/2018 0000 Gross per 24  hour  Intake 460 ml  Output 3300 ml  Net -2840 ml   Filed Weights   11/07/18 0320 11/07/18 0757 11/07/18 1217  Weight: 73.2 kg 72 kg 71.7 kg    Examination:  General exam: Appears calm and comfortable.  Poor historian.  Awake but slightly confused Respiratory  system: Bilateral decreased breath sounds at bases with scattered crackles Cardiovascular system: Rate controlled, S1-S2 heard Gastrointestinal system: Abdomen is nondistended, soft and nontender. Normal bowel sounds heard. Extremities: No cyanosis; trace edema      Data Reviewed: I have personally reviewed following labs and imaging studies  CBC: Recent Labs  Lab 11/02/18 0852 11/06/18 1855 11/06/18 1920 11/07/18 0847 11/07/18 2046 11/08/18 0016  WBC 7.7 9.8  --  8.4 9.3 8.0  NEUTROABS  --   --   --   --   --  5.6  HGB 8.7* 9.4* 10.2* 8.5* 8.9* 8.5*  HCT 28.3* 30.7* 30.0* 27.4* 29.8* 27.2*  MCV 104.8* 106.2*  --  104.2* 104.9* 103.8*  PLT 144* 173  --  164 160 185*   Basic Metabolic Panel: Recent Labs  Lab 11/02/18 0852 11/06/18 1855 11/06/18 1920 11/07/18 0848 11/07/18 2046 11/08/18 0016  NA 137 137 136 137 136 138  K 5.0 4.1 4.1 4.4 3.8 3.8  CL 100 97* 99 100 99 100  CO2 24 22  --  24 28 26   GLUCOSE 80 145* 143* 116* 116* 105*  BUN 54* 48* 50* 54* 13 15  CREATININE 7.72* 6.36* 6.90* 7.01* 2.89* 3.19*  CALCIUM 9.1 9.4  --  8.9 8.7* 8.8*  MG  --   --   --   --   --  2.0  PHOS 3.9  --   --  4.9*  --   --    GFR: Estimated Creatinine Clearance: 17.2 mL/min (A) (by C-G formula based on SCr of 3.19 mg/dL (H)). Liver Function Tests: Recent Labs  Lab 11/02/18 0852 11/07/18 0848 11/07/18 2046  AST  --   --  26  ALT  --   --  27  ALKPHOS  --   --  198*  BILITOT  --   --  1.1  PROT  --   --  7.0  ALBUMIN 2.8* 2.9* 3.0*   No results for input(s): LIPASE, AMYLASE in the last 168 hours. Recent Labs  Lab 11/06/18 1850  AMMONIA 40*   Coagulation Profile: No results for input(s): INR, PROTIME in the last 168 hours. Cardiac Enzymes: Recent Labs  Lab 11/06/18 1855  TROPONINI <0.03   BNP (last 3 results) No results for input(s): PROBNP in the last 8760 hours. HbA1C: No results for input(s): HGBA1C in the last 72 hours. CBG: Recent Labs  Lab  11/06/18 1904 11/07/18 1216 11/07/18 1907 11/07/18 2126 11/08/18 0803  GLUCAP 154* 85 99 111* 84   Lipid Profile: No results for input(s): CHOL, HDL, LDLCALC, TRIG, CHOLHDL, LDLDIRECT in the last 72 hours. Thyroid Function Tests: No results for input(s): TSH, T4TOTAL, FREET4, T3FREE, THYROIDAB in the last 72 hours. Anemia Panel: No results for input(s): VITAMINB12, FOLATE, FERRITIN, TIBC, IRON, RETICCTPCT in the last 72 hours. Sepsis Labs: Recent Labs  Lab 11/06/18 1919 11/06/18 2057 11/06/18 2102 11/07/18 2046 11/08/18 0016  PROCALCITON  --  0.48  --   --   --   LATICACIDVEN 2.94*  --  1.76 1.5 1.2    Recent Results (from the past 240 hour(s))  Blood culture (routine x 2)  Status: None (Preliminary result)   Collection Time: 11/06/18  8:30 PM  Result Value Ref Range Status   Specimen Description BLOOD RIGHT ARM  Final   Special Requests   Final    BOTTLES DRAWN AEROBIC ONLY Blood Culture adequate volume   Culture   Final    NO GROWTH < 12 HOURS Performed at [facility], [address], [place], [place] [address]    Report Status PENDING  Incomplete  Blood culture (routine x 2)     Status: None (Preliminary result)   Collection Time: 11/06/18  8:50 PM  Result Value Ref Range Status   Specimen Description BLOOD RIGHT HAND  Final   Special Requests   Final    BOTTLES DRAWN AEROBIC AND ANAEROBIC Blood Culture adequate volume   Culture   Final    NO GROWTH < 12 HOURS Performed at [facility], [place] [address], [place], [place] [address]    Report Status PENDING  Incomplete         Radiology Studies: Dg Chest 2 View  Result Date: 11/07/2018 CLINICAL DATA:  Respiratory distress EXAM: CHEST - 2 VIEW COMPARISON:  11/06/2018, 10/28/2017, CT chest 10/23/2018, 06/16/2012 FINDINGS: Bilateral interstitial and ground-glass opacity. No significant pleural effusion. Cardiomegaly with aortic atherosclerosis. No pneumothorax. IMPRESSION: Cardiomegaly. Moderate  diffuse bilateral interstitial and ground-glass opacity which may be secondary to pulmonary edema or diffuse pneumonia. Electronically Signed   By: Donavan Foil M.D.   On: 11/07/2018 19:49   Dg Chest Portable 1 View  Result Date: 11/06/2018 CLINICAL DATA:  Syncope. EXAM: PORTABLE CHEST 1 VIEW COMPARISON:  October 28, 2018 FINDINGS: The heart size remains mildly enlarged. No pneumothorax. No nodules or masses. Patchy opacities in the lungs, particularly the right base. No other acute abnormalities. IMPRESSION: Patchy opacities in the lungs may represent multifocal pneumonia versus asymmetric edema. Recommend clinical correlation. Electronically Signed   By: Dorise Bullion III M.D   On: 11/06/2018 20:00        Scheduled Meds: . aspirin EC  325 mg Oral Daily  . calcium acetate  2,668 mg Oral TID WC  . Chlorhexidine Gluconate Cloth  6 each Topical Q0600  . Chlorhexidine Gluconate Cloth  6 each Topical Q0600  . heparin  5,000 Units Subcutaneous Q8H  . insulin aspart  0-9 Units Subcutaneous TID WC   Continuous Infusions: . ceFEPime (MAXIPIME) IV Stopped (11/07/18 2218)  . [START ON 11/09/2018] vancomycin       LOS: 2 days        Aline August, MD [facility] Pager [phone]  If 7PM-7AM, please contact night-coverage [email or web address] Password [facility] 11/08/2018, 9:41 AM

## 2018-11-08 NOTE — Progress Notes (Signed)
Pt placed on Bipap.  Mask secured.  NAD note. Will continue to monitor.

## 2018-11-08 NOTE — Progress Notes (Signed)
ABG drawn, normal results.  Pt placed on Bipap per NP order due to pt having periods of apnea. Mask secured. Pt seems comfortable. Will continue to monitor.

## 2018-11-09 ENCOUNTER — Telehealth: Payer: Self-pay | Admitting: Hematology

## 2018-11-09 ENCOUNTER — Encounter (HOSPITAL_COMMUNITY): Payer: Self-pay | Admitting: Primary Care

## 2018-11-09 ENCOUNTER — Inpatient Hospital Stay (HOSPITAL_COMMUNITY): Payer: Medicare Other

## 2018-11-09 DIAGNOSIS — Z515 Encounter for palliative care: Secondary | ICD-10-CM

## 2018-11-09 DIAGNOSIS — N186 End stage renal disease: Secondary | ICD-10-CM

## 2018-11-09 DIAGNOSIS — J9601 Acute respiratory failure with hypoxia: Secondary | ICD-10-CM

## 2018-11-09 DIAGNOSIS — Z7189 Other specified counseling: Secondary | ICD-10-CM

## 2018-11-09 LAB — TSH: TSH: 1.956 u[IU]/mL (ref 0.350–4.500)

## 2018-11-09 LAB — VITAMIN B12: Vitamin B-12: 828 pg/mL (ref 180–914)

## 2018-11-09 LAB — GLUCOSE, CAPILLARY
GLUCOSE-CAPILLARY: 201 mg/dL — AB (ref 70–99)
Glucose-Capillary: 84 mg/dL (ref 70–99)
Glucose-Capillary: 96 mg/dL (ref 70–99)

## 2018-11-09 LAB — COMPREHENSIVE METABOLIC PANEL
ALK PHOS: 197 U/L — AB (ref 38–126)
ALT: 26 U/L (ref 0–44)
AST: 20 U/L (ref 15–41)
Albumin: 2.8 g/dL — ABNORMAL LOW (ref 3.5–5.0)
Anion gap: 10 (ref 5–15)
BUN: 39 mg/dL — ABNORMAL HIGH (ref 8–23)
CALCIUM: 9.1 mg/dL (ref 8.9–10.3)
CO2: 27 mmol/L (ref 22–32)
Chloride: 100 mmol/L (ref 98–111)
Creatinine, Ser: 5.34 mg/dL — ABNORMAL HIGH (ref 0.61–1.24)
GFR calc Af Amer: 11 mL/min — ABNORMAL LOW (ref >60)
GFR calc non Af Amer: 9 mL/min — ABNORMAL LOW (ref >60)
Glucose, Bld: 94 mg/dL (ref 70–99)
Potassium: 4.1 mmol/L (ref 3.5–5.1)
SODIUM: 137 mmol/L (ref 135–145)
Total Bilirubin: 0.8 mg/dL (ref 0.3–1.2)
Total Protein: 6.9 g/dL (ref 6.5–8.1)

## 2018-11-09 LAB — CBC WITH DIFFERENTIAL/PLATELET
ABS IMMATURE GRANULOCYTES: 0.03 10*3/uL (ref 0.00–0.07)
BASOS ABS: 0 10*3/uL (ref 0.0–0.1)
BASOS PCT: 1 %
EOS ABS: 0.4 10*3/uL (ref 0.0–0.5)
EOS PCT: 5 %
HEMATOCRIT: 28 % — AB (ref 39.0–52.0)
HEMOGLOBIN: 8.7 g/dL — AB (ref 13.0–17.0)
Immature Granulocytes: 0 %
Lymphocytes Relative: 15 %
Lymphs Abs: 1.2 10*3/uL (ref 0.7–4.0)
MCH: 32.2 pg (ref 26.0–34.0)
MCHC: 31.1 g/dL (ref 30.0–36.0)
MCV: 103.7 fL — ABNORMAL HIGH (ref 80.0–100.0)
Monocytes Absolute: 1.1 10*3/uL — ABNORMAL HIGH (ref 0.1–1.0)
Monocytes Relative: 15 %
Neutro Abs: 5 10*3/uL (ref 1.7–7.7)
Neutrophils Relative %: 64 %
Platelets: 138 10*3/uL — ABNORMAL LOW (ref 150–400)
RBC: 2.7 MIL/uL — AB (ref 4.22–5.81)
RDW: 17.1 % — ABNORMAL HIGH (ref 11.5–15.5)
WBC: 7.7 10*3/uL (ref 4.0–10.5)
nRBC: 0 % (ref 0.0–0.2)

## 2018-11-09 LAB — MAGNESIUM: Magnesium: 2.2 mg/dL (ref 1.7–2.4)

## 2018-11-09 LAB — AMMONIA: Ammonia: 42 umol/L — ABNORMAL HIGH (ref 9–35)

## 2018-11-09 MED ORDER — HEPARIN SODIUM (PORCINE) 1000 UNIT/ML DIALYSIS
2500.0000 [IU] | Freq: Once | INTRAMUSCULAR | Status: AC
  Administered 2018-11-09: 2500 [IU] via INTRAVENOUS_CENTRAL

## 2018-11-09 MED ORDER — HEPARIN SODIUM (PORCINE) 1000 UNIT/ML IJ SOLN
INTRAMUSCULAR | Status: AC
  Administered 2018-11-09: 2500 [IU] via INTRAVENOUS_CENTRAL
  Filled 2018-11-09: qty 3

## 2018-11-09 MED ORDER — DARBEPOETIN ALFA 60 MCG/0.3ML IJ SOSY
PREFILLED_SYRINGE | INTRAMUSCULAR | Status: AC
  Administered 2018-11-09: 60 ug via INTRAVENOUS
  Filled 2018-11-09: qty 0.3

## 2018-11-09 MED ORDER — DARBEPOETIN ALFA 60 MCG/0.3ML IJ SOSY
60.0000 ug | PREFILLED_SYRINGE | INTRAMUSCULAR | Status: DC
  Administered 2018-11-09: 60 ug via INTRAVENOUS
  Filled 2018-11-09 (×2): qty 0.3

## 2018-11-09 MED ORDER — SODIUM CHLORIDE 0.9 % IV SOLN
62.5000 mg | INTRAVENOUS | Status: DC
  Administered 2018-11-09 – 2018-11-16 (×2): 62.5 mg via INTRAVENOUS
  Filled 2018-11-09 (×3): qty 5

## 2018-11-09 NOTE — Evaluation (Signed)
Clinical/Bedside Swallow Evaluation Patient Details  Name: [name] MRN: [id number] Date of Birth: [birth year]  Today's Date: 11/09/2018 Time: SLP Start Time (ACUTE ONLY): 1336 SLP Stop Time (ACUTE ONLY): 1349 SLP Time Calculation (min) (ACUTE ONLY): 13 min  Past Medical History:  Past Medical History:  Diagnosis Date  . A-fib ([place])   . Anemia   . Blood transfusion   . BPH (benign prostatic hyperplasia)   . CHF (congestive heart failure) ([place])   . Diarrhea   . DM (diabetes mellitus) ([place])   . ESRD on hemodialysis ([place])    Started dialysis in 2009  . History of GI bleed    secondary to coumadin  . HTN (hypertension)   . Hyperlipidemia   . OSA (obstructive sleep apnea)    uses CPAP  . Secondary hyperparathyroidism of renal origin [facility])    Past Surgical History:  Past Surgical History:  Procedure Laterality Date  . BVT  7/34/19   Left  Basilic Vein Transposition  . CHOLECYSTECTOMY    . EYE SURGERY     Catarct bil  . INSERTION OF DIALYSIS CATHETER  05/28/2012   Procedure: INSERTION OF DIALYSIS CATHETER;  Surgeon: Mal Misty, MD;  Location: [place];  Service: Vascular;  Laterality: Right;  . Left arm shuntogram.    . Left forearm loop graft with 6 mm Gore-Tex graft.    [place] plana vitrectomy with 25-gauge system     HPI:  [name] is a 77 y.o. male with PMH significant for PAF, CHF, ESRD on hemodialysis, diabetes, HTN, hyperlipidemia, OSA, GERD, GI bleed, present to the hospital 11/06/18 for evaluation of shortness of breath and near syncope. Pt had significant drop in O2 sats (to 75%) 11/07/18. 11/11 CXR showed mod diffuse bilateral interstitial and ground-glass opacity which may be secondary to pulmonary edema or diffuse pneumonia - 11/12 CXR improving patchy opacities.   Assessment / Plan / Recommendation Clinical Impression  Pt was alert and pleasant throughout bedside swallow evaluation today. He denied any previous or current difficulties with swallowing.  His volitional cough proved strong and oral motor examination revealed lingual, labial, facial ROM WFL. Pt completed 3 oz water test, as well as multiple subsequent trials of thin and regular POs without any clinical s/s aspiration. SLP educated patient regarding aspiration precautions - encouraged to take small bites/sips and sit upright during meals. Given current presentation and improving chest x-rays, recommend continue regular diet, thin liquids, and no further ST is indicated at this time.    SLP Visit Diagnosis: Dysphagia, unspecified (R13.10)    Aspiration Risk  Mild aspiration risk    Diet Recommendation Regular;Thin liquid   Liquid Administration via: Cup;Straw Medication Administration: Whole meds with puree Supervision: Patient able to self feed Compensations: Minimize environmental distractions;Slow rate;Small sips/bites Postural Changes: Seated upright at 90 degrees;Remain upright for at least 30 minutes after po intake    Other  Recommendations Oral Care Recommendations: Oral care BID   Follow up Recommendations None      Frequency and Duration            Prognosis        Swallow Study   General HPI: [name] is a 77 y.o. male with PMH significant for PAF, CHF, ESRD on hemodialysis, diabetes, HTN, hyperlipidemia, OSA, GERD, GI bleed, present to the hospital 11/06/18 for evaluation of shortness of breath and near syncope. Pt had significant drop in O2 sats (to 75%) 11/07/18. 11/11 CXR showed mod diffuse  bilateral interstitial and ground-glass opacity which may be secondary to pulmonary edema or diffuse pneumonia - 11/12 CXR improving patchy opacities. Type of Study: Bedside Swallow Evaluation Previous Swallow Assessment: none found in chart Diet Prior to this Study: Regular;Thin liquids Temperature Spikes Noted: No Respiratory Status: Nasal cannula History of Recent Intubation: No Behavior/Cognition: Alert;Cooperative;Pleasant mood Oral Cavity Assessment:  Within Functional Limits Oral Care Completed by SLP: No Oral Cavity - Dentition: Missing dentition Vision: Functional for self-feeding Self-Feeding Abilities: Able to feed self Patient Positioning: Upright in bed Baseline Vocal Quality: Normal Volitional Cough: Strong Volitional Swallow: Able to elicit    Oral/Motor/Sensory Function Overall Oral Motor/Sensory Function: Within functional limits   Ice Chips Ice chips: Not tested   Thin Liquid Thin Liquid: Within functional limits Presentation: Cup    Nectar Thick Nectar Thick Liquid: Not tested   Honey Thick Honey Thick Liquid: Not tested   Puree Puree: Not tested   Solid     Solid: Within functional limits Presentation: Self Fed     Jettie Booze, Student SLP  Jettie Booze 11/09/2018,2:52 PM

## 2018-11-09 NOTE — Progress Notes (Signed)
PT Cancellation Note  Patient Details Name: [name] MRN: [id number] DOB: Jun 16, 1941   Cancelled Treatment:    Reason Eval/Treat Not Completed: Patient at procedure or test/unavailable   Ellamae Sia, PT, DPT Acute Rehabilitation Services Pager [phone] Office [phone]    Willy Eddy 11/09/2018, 8:21 AM

## 2018-11-09 NOTE — Progress Notes (Signed)
Physical Therapy Treatment Patient Details Name: [name] MRN: [id number] DOB: 06/09/1941 Today's Date: 11/09/2018    History of Present Illness 78yo male sent to the ED from his SNF due to AMS and BP 60/40. Head CT negative for acute changes. Diagnosed with sepsis. PMH A-fib, CHF, CKD, DM, HTN, on HD x3/week     PT Comments    Patient progressing slowly towards his physical therapy goals. Requiring min-moderate assistance for transfers and minimal assistance via handheld assist for limited ambulation from bed to chair. BP 83/49 prior to mobility but increased to 91/45 post mobility, 83 HR, 95% SpO2 on RA. D/c plan remains appropriate.     Follow Up Recommendations  SNF;Supervision/Assistance - 24 hour     Equipment Recommendations  Other (comment)(TBA)    Recommendations for Other Services       Precautions / Restrictions Precautions Precautions: Fall;Other (comment) Precaution Comments: chronic wounds on buttocks and feet  Restrictions Weight Bearing Restrictions: No    Mobility  Bed Mobility Overal bed mobility: Needs Assistance Bed Mobility: Supine to Sit     Supine to sit: Min assist     General bed mobility comments: Pt seeking handheld assist to elevate trunk  Transfers Overall transfer level: Needs assistance Equipment used: None Transfers: Sit to/from Stand;Stand Pivot Transfers Sit to Stand: Min assist;Mod assist         General transfer comment: Min-mod assist to lift and steady  Ambulation/Gait Ambulation/Gait assistance: Min assist   Assistive device: 1 person hand held assist Gait Pattern/deviations: Step-through pattern;Decreased stride length     General Gait Details: Pivotal steps from bed to recliner with HHA   Stairs             Wheelchair Mobility    Modified Rankin (Stroke Patients Only)       Balance Overall balance assessment: Needs assistance Sitting-balance support: Feet supported;No upper extremity  supported Sitting balance-Leahy Scale: Good Sitting balance - Comments: able to don/doff socks EOB without LOB    Standing balance support: Single extremity supported;During functional activity Standing balance-Leahy Scale: Poor Standing balance comment: requires UE support                             Cognition Arousal/Alertness: Awake/alert Behavior During Therapy: Flat affect Overall Cognitive Status: No family/caregiver present to determine baseline cognitive functioning                                 General Comments: Pt not oriented to situation, but was appropriate, followed multistep commands      Exercises      General Comments        Pertinent Vitals/Pain Pain Assessment: Faces Faces Pain Scale: Hurts little more Pain Location: "right finger" Pain Descriptors / Indicators: Other (Comment)("stiff") Pain Intervention(s): Monitored during session    Home Living                      Prior Function            PT Goals (current goals can now be found in the care plan section) Acute Rehab PT Goals Patient Stated Goal: to go home  PT Goal Formulation: Patient unable to participate in goal setting Time For Goal Achievement: 11/21/18 Potential to Achieve Goals: Fair Progress towards PT goals: Progressing toward goals    Frequency    Min  2X/week      PT Plan Current plan remains appropriate    Co-evaluation              AM-PAC PT "6 Clicks" Daily Activity  Outcome Measure  Difficulty turning over in bed (including adjusting bedclothes, sheets and blankets)?: Unable Difficulty moving from lying on back to sitting on the side of the bed? : Unable Difficulty sitting down on and standing up from a chair with arms (e.g., wheelchair, bedside commode, etc,.)?: Unable Help needed moving to and from a bed to chair (including a wheelchair)?: A Little Help needed walking in hospital room?: A Little Help needed climbing 3-5  steps with a railing? : A Lot 6 Click Score: 11    End of Session Equipment Utilized During Treatment: Gait belt Activity Tolerance: Patient tolerated treatment well Patient left: in chair;with call bell/phone within reach;with chair alarm set Nurse Communication: Mobility status PT Visit Diagnosis: Muscle weakness (generalized) (M62.81);Other abnormalities of gait and mobility (R26.89);Other symptoms and signs involving the nervous system (R29.898)     Time: 2197-5883 PT Time Calculation (min) (ACUTE ONLY): 17 min  Charges:  $Therapeutic Activity: 8-22 mins                    Ellamae Sia, PT, DPT Acute Rehabilitation Services Pager [phone] Office [phone]  Willy Eddy 11/09/2018, 5:13 PM

## 2018-11-09 NOTE — Progress Notes (Signed)
[facility] Progress Note  Subjective: remains confused, breathing much better, on HD  Vitals:   11/09/18 0808 11/09/18 0830 11/09/18 0900 11/09/18 0930  BP: 121/68 106/62 98/62 116/60  Pulse: 94 83 97 (!) 102  Resp:      Temp:      TempSrc:      SpO2:      Weight:      Height:        Inpatient medications: . aspirin EC  325 mg Oral Daily  . calcium acetate  2,668 mg Oral TID WC  . Chlorhexidine Gluconate Cloth  6 each Topical Q0600  . Chlorhexidine Gluconate Cloth  6 each Topical Q0600  . heparin  5,000 Units Subcutaneous Q8H  . insulin aspart  0-9 Units Subcutaneous TID WC   . ceFEPime (MAXIPIME) IV 1 g (11/08/18 2119)   acetaminophen, alteplase, heparin, hydrOXYzine, ipratropium-albuterol  Iron/TIBC/Ferritin/ %Sat    Component Value Date/Time   IRON 48 06/05/2011 0844   TIBC 194 (L) 06/05/2011 0844   FERRITIN 872 (H) 06/05/2011 0844   IRONPCTSAT 25 06/05/2011 0844    Exam: Gen alert, pleasant, confused still No jvd or bruits Chest clear bilat Cor irreg irreg w harsh SEM 2-3/ 6, no diast M , no RG Abd soft ntnd no mass or ascites +bs Ext no sig LE edema Neuro is drowsy, Ox 2, nonfocal LUA AVF+bruit    Home meds:  - aspirin 325 qd/ calcium acetate 3 tid ac  - metoprolol tartrate 50 bid  - prns   Dialysis: [facility] MWF  4h  70kg  LUA AVF  2/2.5 bath  Hep 2500    - hect 2 ug tiw  - mircera 100 ug due 11/11  - venofer 50 /wk q wed      Impression/ Plan: 1. SOB - recurrent issue, was just dc'd 4-5 days ago for similar.  No fevers and no ^WBC.  Suspect this is due to vol overload. Lower dry wt w/ HD today, max UF. DC abx.   2. AMS / confusion - not sure cause, persistent, meds reviewed 3. ESRD - MWF. HD today 4. HTN - BP's soft, holding metoprolol 5. MBD ckd - cont meds 6. Anemia ckd - esa due this week, will give darbe 60 ug w/ HD today  7. Parox afib 8. OSA 9. Hx DM2 - not on any medication now     Kelly Splinter  MD Tina pager [phone]   11/09/2018, 9:42 AM   Recent Labs  Lab 11/07/18 0848 11/07/18 2046 11/08/18 0016 11/09/18 0230  NA 137 136 138 137  K 4.4 3.8 3.8 4.1  CL 100 99 100 100  CO2 24 28 26 27   GLUCOSE 116* 116* 105* 94  BUN 54* 13 15 39*  CREATININE 7.01* 2.89* 3.19* 5.34*  CALCIUM 8.9 8.7* 8.8* 9.1  PHOS 4.9*  --   --   --   ALBUMIN 2.9* 3.0*  --  2.8*   Recent Labs  Lab 11/07/18 2046 11/09/18 0230  AST 26 20  ALT 27 26  ALKPHOS 198* 197*  BILITOT 1.1 0.8  PROT 7.0 6.9   Recent Labs  Lab 11/08/18 0016 11/09/18 0230  WBC 8.0 7.7  NEUTROABS 5.6 5.0  HGB 8.5* 8.7*  HCT 27.2* 28.0*  MCV 103.8* 103.7*  PLT 147* 138*

## 2018-11-09 NOTE — Progress Notes (Signed)
Pt switch to AVAPS due to pt having periods of apnea and low Vt alarm kept going off on Bipap. Pt tolerating well.

## 2018-11-09 NOTE — Progress Notes (Signed)
Pt placed on BIPAP for night time rest.  

## 2018-11-09 NOTE — Telephone Encounter (Signed)
Unable to reach patient on phone. Phone went straight to voice mail on both numbers and mailbox is full. Mailed appt letter to inform pt of new patient appt 12/5/9 at 12 pm

## 2018-11-09 NOTE — Care Management Important Message (Signed)
Important Message  Patient Details  Name: [name] MRN: [id number] Date of Birth: 1941-09-22   Medicare Important Message Given:  Yes    Mia Winthrop P Ezequiel Macauley 11/09/2018, 4:10 PM

## 2018-11-09 NOTE — Consult Note (Signed)
Consultation Note Date: 11/09/2018   Patient Name: [name]  DOB: 09-Aug-1941  MRN: [id number]  Age / Sex: 77 y.o., male  PCP: No primary care provider on file. Referring Physician: Modena Jansky, MD  Reason for Consultation: Establishing goals of care  HPI/Patient Profile: 77 y.o. male  with past medical history of chronic kidney disease on dialysis for 3 years, diabetes well controlled, congestive heart failure, A. fib, anemia, high blood pressure and cholesterol, obstructive sleep apnea uses CPAP, history of GI bleed secondary to Coumadin admitted on 11/06/2018 with acute hypoxemic respiratory failure secondary to volume overload.  PMT consulted for goals of care.   Clinical Assessment and Goals of Care: [name] is sitting up in the [place] chair in his room.  He greets me making and mostly keeping eye contact.  He is calm and cooperative, but confused.  He is able to tell me after hesitation that we are in [facility] he is unable to tell me the month although he knows that Thanksgiving is coming up.  When I say that it is November, he smiles and states that that is his birth month.  There is no family at bedside at this time.  No detailed PMT conversation at this time due to confusion.  When I share that I will see him tomorrow, he asks if he will still be "here".  I share that he will.  Call to [name] at [phone].  No answer, unable to leave voicemail.  PMT will continue to follow patient and reach out to daughter.  Consultation with attending, Dr. Algis Liming related to goals of care discussion.  Healthcare power of attorney NEXT OF KIN -[name] names his [name] as his Ambulance person.  He tells me that he is divorced, and has no other children. Advance directive document noted in epic chart under document list.  This document names daughter,  [name] is DURABLE POWER OF ATTORNEY, there is no mention of healthcare power of attorney.   SUMMARY OF RECOMMENDATIONS   Continue to treat the treatable. Continue CODE STATUS discussions. Anticipate rehab upon discharge.  Code Status/Advance Care Planning:  Full code -by default, unable to have CODE STATUS discussion with patient due to confusion.  Symptom Management:   Per hospitalist, no additional needs at this time.  Palliative Prophylaxis:   Turn Reposition  Additional Recommendations (Limitations, Scope, Preferences):  Full Scope Treatment  Psycho-social/Spiritual:   Desire for further Chaplaincy support:no  Additional Recommendations: Caregiving  Support/Resources and Education on Hospice  Prognosis:   Unable to determine, based on outcomes.  6 to 12 months would not be surprising based on 2 hospitalizations and one ED visit in 6 months, end-stage renal disease on hemodialysis, heart failure.  Prognosis can be difficult to determine due to artificial support from hemodialysis.  Discharge Planning: Anticipate rehab stay      Primary Diagnoses: Present on Admission: . Acute hypoxemic respiratory failure ([place]) . PAF (paroxysmal atrial fibrillation) ([place]) . End stage  renal disease ([place]) . HYPERTENSION, BENIGN . Chronic anemia . OBSTRUCTIVE SLEEP APNEA . Acute encephalopathy   I have reviewed the medical record, interviewed the patient and family, and examined the patient. The following aspects are pertinent.  Past Medical History:  Diagnosis Date  . A-fib ([place])   . Anemia   . Blood transfusion   . BPH (benign prostatic hyperplasia)   . CHF (congestive heart failure) ([place])   . Diarrhea   . DM (diabetes mellitus) ([place])   . ESRD on hemodialysis ([place])    Started dialysis in 2009  . History of GI bleed    secondary to coumadin  . HTN (hypertension)   . Hyperlipidemia   . OSA (obstructive sleep apnea)    uses CPAP  . Secondary  hyperparathyroidism of renal origin [facility])    Social History   Socioeconomic History  . Marital status: Divorced    Spouse name: Not on file  . Number of children: Not on file  . Years of education: Not on file  . Highest education level: Not on file  Occupational History  . Not on file  Social Needs  . Financial resource strain: Not on file  . Food insecurity:    Worry: Not on file    Inability: Not on file  . Transportation needs:    Medical: Not on file    Non-medical: Not on file  Tobacco Use  . Smoking status: Former Smoker    Types: Cigarettes    Last attempt to quit: 06/30/2004    Years since quitting: 14.3  . Smokeless tobacco: Never Used  Substance and Sexual Activity  . Alcohol use: No  . Drug use: No  . Sexual activity: Never  Lifestyle  . Physical activity:    Days per week: Not on file    Minutes per session: Not on file  . Stress: Not on file  Relationships  . Social connections:    Talks on phone: Not on file    Gets together: Not on file    Attends religious service: Not on file    Active member of club or organization: Not on file    Attends meetings of clubs or organizations: Not on file    Relationship status: Not on file  Other Topics Concern  . Not on file  Social History Narrative     The patient is a former smoker, quit is 2005.  Does not     drink or abuse drugs.  Currently, lives alone at home.         Family History  Problem Relation Age of Onset  . Alzheimer's disease Mother   . Diabetes Father        Amputation:  bilateral legs  . Cancer Daughter        breast cancer  . Diabetes Son   . Heart disease Son        before age 46  . Hypertension Son   . Anesthesia problems Neg Hx    Scheduled Meds: . aspirin EC  325 mg Oral Daily  . calcium acetate  2,668 mg Oral TID WC  . Chlorhexidine Gluconate Cloth  6 each Topical Q0600  . Chlorhexidine Gluconate Cloth  6 each Topical Q0600  . darbepoetin (ARANESP) injection - DIALYSIS  60  mcg Intravenous Q Wed-HD  . heparin  5,000 Units Subcutaneous Q8H   Continuous Infusions: . ferric gluconate (FERRLECIT/NULECIT) IV 62.5 mg (11/09/18 1058)   PRN Meds:.acetaminophen, alteplase, heparin, hydrOXYzine, ipratropium-albuterol Medications  Prior to Admission:  Prior to Admission medications   Medication Sig Start Date End Date Taking? Authorizing Provider  acetaminophen (TYLENOL) 500 MG tablet Take 1,000 mg by mouth 3 (three) times daily.   Yes [provider]  aspirin EC 325 MG tablet Take 1 tablet (325 mg total) by mouth daily. 02/14/15  Yes Nahser, Wonda Cheng, MD  calcium acetate (PHOSLO) 667 MG capsule Take 2,668 mg by mouth 3 (three) times daily with meals.  02/03/11  Yes [provider]  metoprolol tartrate (LOPRESSOR) 50 MG tablet Take 1 tablet (50 mg total) by mouth 2 (two) times daily. 10/29/18  Yes Bonnielee Haff, MD  hydrOXYzine (ATARAX/VISTARIL) 25 MG tablet Take 25 mg by mouth every 8 (eight) hours as needed for itching.    [provider]   Allergies  Allergen Reactions  . Tape Itching and Other (See Comments)    Cloth tape only   Review of Systems  Unable to perform ROS: Age    Physical Exam  Constitutional: No distress.  Appears chronically ill, somewhat frail.  Makes and mostly keeps eye contact  HENT:  Head: Atraumatic.  Cardiovascular: Normal rate.  Pulmonary/Chest: Effort normal. No respiratory distress.  Abdominal: Soft. He exhibits no distension.  Neurological: He is alert.  Skin: Skin is warm and dry.  Psychiatric:  Confused, calm and cooperative, not fearful.  Nursing note and vitals reviewed.   Vital Signs: BP (!) 90/49 (BP Location: Right Arm)   Pulse 85   Temp 97.8 F (36.6 C) (Oral)   Resp (!) 23   Ht 5\' 6"  (1.676 m)   Wt 68.6 kg   SpO2 100%   BMI 24.41 kg/m  Pain Scale: 0-10   Pain Score: 0-No pain   SpO2: SpO2: 100 % O2 Device:SpO2: 100 % O2 Flow Rate: .O2 Flow Rate (L/min): 2 L/min  IO:  Intake/output summary:   Intake/Output Summary (Last 24 hours) at 11/09/2018 1514 Last data filed at 11/09/2018 1203 Gross per 24 hour  Intake 100 ml  Output 2516 ml  Net -2416 ml    LBM: Last BM Date: 11/08/18 Baseline Weight: Weight: 73.2 kg Most recent weight: Weight: 68.6 kg     Palliative Assessment/Data:   Flowsheet Rows     Most Recent Value  Intake Tab  Referral Department  Hospitalist  Unit at Time of Referral  Intermediate Care Unit  Palliative Care Primary Diagnosis  Cardiac  Date Notified  11/08/18  Palliative Care Type  New Palliative care  Reason for referral  Clarify Goals of Care  Date of Admission  11/06/18  Date first seen by Palliative Care  11/09/18  # of days Palliative referral response time  1 Day(s)  # of days IP prior to Palliative referral  2  Clinical Assessment  Palliative Performance Scale Score  50%  Pain Max last 24 hours  Not able to report  Pain Min Last 24 hours  Not able to report  Dyspnea Max Last 24 Hours  Not able to report  Dyspnea Min Last 24 hours  Not able to report  Psychosocial & Spiritual Assessment  Palliative Care Outcomes  Patient/Family meeting held?  Yes      Time In: 1540 Time Out: 1630 Time Total: 50 minutes Greater than 50%  of this time was spent counseling and coordinating care related to the above assessment and plan.  Signed by: Drue Novel, NP   Please contact Palliative Medicine Team phone at [phone] for questions and concerns.  For individual provider: See Shea Evans

## 2018-11-09 NOTE — Progress Notes (Signed)
Patient ID: [name], male   DOB: 02-20-1941, 77 y.o.   MRN: [id number]  PROGRESS NOTE    [name]  UVO:[id number] DOB: 1941/03/03 DOA: 11/06/2018 PCP: No primary care provider on file.   Brief Narrative:  77 year old male with PMH of PAF, chronic diastolic CHF, ESRD on MWF HD, DM 2, HTN, HLD, OSA,?  On home oxygen, hospitalized 10/20/2018-11/01/2018 for acute respiratory failure with hypoxia due to volume overload versus pneumonia, sepsis, acute metabolic encephalopathy, completed a course of antibiotics, volume management across dialysis, hypoxia resolved, now presented from nursing home with dyspnea and near syncope.  Admitted again for acute hypoxic respiratory failure secondary to volume overload versus multifocal pneumonia and acute metabolic encephalopathy.  Nephrology consulted for dialysis needs.  Clinically not pneumonia and hence antibiotics stopped.    Assessment & Plan:   Principal Problem:   Acute hypoxemic respiratory failure (HCC) Active Problems:   DM (diabetes mellitus) (HCC)   OBSTRUCTIVE SLEEP APNEA   HYPERTENSION, BENIGN   PAF (paroxysmal atrial fibrillation) (HCC)   End stage renal disease (HCC)   Acute encephalopathy   Chronic anemia   Volume overload   Multifocal pneumonia   Acute metabolic encephalopathy   Physical deconditioning   Acute hypoxic hypercapnic respiratory failure -Suspect more from ESRD and volume overload rather than pneumonia.  Volume management across HD.  Was on empiric antibiotics although low index of suspicion for pneumonia and recently completed a full week's course of antibiotics.  Antibiotics discontinued 11/13.  Blood cultures x2: Negative to date.  Chest x-ray 11/13 shows improvement. -Hypoxia resolved.  Acute on chronic diastolic CHF TTE 74/25/9563: LVEF 60-65%.  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  PA peak pressure 67 mmHg. Volume management across HD.  Consider outpatient  cardiology consultation and follow-up.  Acute toxic metabolic encephalopathy -Patient's altered mental status was probably secondary to hypoxia on presentation -Patient was very altered 11/11 evening again after having received Ativan during dialysis in the afternoon.  He was hypoxic and very drowsy and had to be put on BiPAP for a few hours.  Although alert and oriented x2, remains pleasantly confused but not agitated.  Need to clarify baseline mental status with family.  Ammonia levels unremarkable. TSH and B12 normal. -Had similar thing happen to him during recent hospitalization and reportedly resolved.?  Acute illness complicating underlying dementia. -Palliative care team was consulted for goals of care, input pending.  End-stage renal disease on hemodialysis -Dialysis as per nephrology.  Seen at HD this morning.  I discussed with Dr. Jonnie Finner who also sees no indication for ongoing antibiotics.  Paroxysmal atrial fibrillation -Not on anticoagulant because of history of GI bleed.  Controlled ventricular rate.  Diabetes mellitus type II - Recent A1c 4.2 -Continue sliding scale insulin.  Had mild hypoglycemic episode on 11/12.  DC SSI and monitor CBGs.  Essential hypertension -Blood pressure on the lower side.  Beta-blocker held.  OSA - continue CPAP  Anemia of chronic disease/macrocytic anemia -Stable.  Monitor.  TSH and B12 normal.  Physical deconditioning -PT eval  Lytic lesions Detailed evaluation done during recent hospitalization and as per documentation and DC summary 11/01/2018.  Plan as outlined in that DC summary.  SPEP 10/30/2018: Unremarkable.  Evidence of monoclonal protein is not apparent.    DVT prophylaxis: Subcutaneous heparin Code Status: Full code  family Communication: None at bedside Disposition Plan: To SNF pending clinical improvement  Consultants:  Nephrology PMT-pending  Procedures:  HD  Antimicrobials:  Cefepime and vancomycin from  11/06/2018-11/13   Subjective: Patient seen this morning at HD.  Alert and oriented to self and partly to place ("hospital") but not to time ("2001").  Reports that he is hungry and wants telemetry leads removed.  Denies dyspnea or pain.  No cough.  As per nursing, ongoing pleasant confusion.  Objective: Vitals:   11/09/18 1130 11/09/18 1200 11/09/18 1203 11/09/18 1257  BP: (!) 92/55 (!) 95/56 102/61 (!) 90/49  Pulse: 92 74 97 85  Resp:   18 (!) 23  Temp:   98 F (36.7 C) 97.8 F (36.6 C)  TempSrc:   Oral Oral  SpO2:   100% 100%  Weight:   68.6 kg   Height:        Intake/Output Summary (Last 24 hours) at 11/09/2018 1348 Last data filed at 11/09/2018 1203 Gross per 24 hour  Intake 100 ml  Output 2516 ml  Net -2416 ml   Filed Weights   11/07/18 1217 11/09/18 0751 11/09/18 1203  Weight: 71.7 kg 70.8 kg 68.6 kg    Examination:  General exam: Pleasant elderly male, moderately built and nourished lying comfortably propped up in bed undergoing HD.  Does not appear septic or toxic. Respiratory system: Occasional basal crackles but otherwise clear to auscultation.  No increased work of breathing. Cardiovascular system: S1-S2 heard, irregularly irregular.  No JVD, murmurs or pedal edema.  Telemetry personally reviewed: A. fib with controlled ventricular rate. Gastrointestinal system: Abdomen is nondistended, soft and nontender. Normal bowel sounds heard.  Stable. Extremities: No cyanosis; trace edema.  Moves all limbs symmetrically.  Left upper extremity AV fistula with ongoing dialysis. CNS: Mental status as above.  No focal neurological deficits.     Data Reviewed: I have personally reviewed following labs and imaging studies  CBC: Recent Labs  Lab 11/06/18 1855 11/06/18 1920 11/07/18 0847 11/07/18 2046 11/08/18 0016 11/09/18 0230  WBC 9.8  --  8.4 9.3 8.0 7.7  NEUTROABS  --   --   --   --  5.6 5.0  HGB 9.4* 10.2* 8.5* 8.9* 8.5* 8.7*  HCT 30.7* 30.0* 27.4* 29.8*  27.2* 28.0*  MCV 106.2*  --  104.2* 104.9* 103.8* 103.7*  PLT 173  --  164 160 147* 096*   Basic Metabolic Panel: Recent Labs  Lab 11/06/18 1855 11/06/18 1920 11/07/18 0848 11/07/18 2046 11/08/18 0016 11/09/18 0230  NA 137 136 137 136 138 137  K 4.1 4.1 4.4 3.8 3.8 4.1  CL 97* 99 100 99 100 100  CO2 22  --  24 28 26 27   GLUCOSE 145* 143* 116* 116* 105* 94  BUN 48* 50* 54* 13 15 39*  CREATININE 6.36* 6.90* 7.01* 2.89* 3.19* 5.34*  CALCIUM 9.4  --  8.9 8.7* 8.8* 9.1  MG  --   --   --   --  2.0 2.2  PHOS  --   --  4.9*  --   --   --    GFR: Estimated Creatinine Clearance: 10.3 mL/min (A) (by C-G formula based on SCr of 5.34 mg/dL (H)). Liver Function Tests: Recent Labs  Lab 11/07/18 0848 11/07/18 2046 11/09/18 0230  AST  --  26 20  ALT  --  27 26  ALKPHOS  --  198* 197*  BILITOT  --  1.1 0.8  PROT  --  7.0 6.9  ALBUMIN 2.9* 3.0* 2.8*    Recent Labs  Lab 11/06/18 1850 11/09/18 0230  AMMONIA 40* 42*  Cardiac Enzymes: Recent Labs  Lab 11/06/18 1855  TROPONINI <0.03   CBG: Recent Labs  Lab 11/08/18 0803 11/08/18 1224 11/08/18 1717 11/08/18 2115 11/09/18 1256  GLUCAP 84 67* 128* 140* 84   Thyroid Function Tests: Recent Labs    11/09/18 0230  TSH 1.956   Anemia Panel: Recent Labs    11/09/18 0230  VITAMINB12 828     Recent Results (from the past 240 hour(s))  Blood culture (routine x 2)     Status: None (Preliminary result)   Collection Time: 11/06/18  8:30 PM  Result Value Ref Range Status   Specimen Description BLOOD RIGHT ARM  Final   Special Requests   Final    BOTTLES DRAWN AEROBIC ONLY Blood Culture adequate volume   Culture   Final    NO GROWTH 3 DAYS Performed at [facility], [place] [address], [place], [place] [address]    Report Status PENDING  Incomplete  Blood culture (routine x 2)     Status: None (Preliminary result)   Collection Time: 11/06/18  8:50 PM  Result Value Ref Range Status   Specimen Description BLOOD  RIGHT HAND  Final   Special Requests   Final    BOTTLES DRAWN AEROBIC AND ANAEROBIC Blood Culture adequate volume   Culture   Final    NO GROWTH 3 DAYS Performed at [facility], [place] [address], [place], [place] [address]    Report Status PENDING  Incomplete         Radiology Studies: Dg Chest 2 View  Result Date: 11/09/2018 CLINICAL DATA:  Shortness of breath EXAM: CHEST - 2 VIEW COMPARISON:  11/07/2018 FINDINGS: Mild cardiomegaly. Improved aeration and lung volumes. Improving bilateral airspace opacities with mild residual interstitial opacities. No visible effusions. No acute bony abnormality. IMPRESSION: Improving aeration with increasing lung volumes and decreasing patchy bilateral airspace opacities. Continued interstitial opacities/thickening. Mild cardiomegaly. Electronically Signed   By: Rolm Baptise M.D.   On: 11/09/2018 08:50   Dg Chest 2 View  Result Date: 11/07/2018 CLINICAL DATA:  Respiratory distress EXAM: CHEST - 2 VIEW COMPARISON:  11/06/2018, 10/28/2017, CT chest 10/23/2018, 06/16/2012 FINDINGS: Bilateral interstitial and ground-glass opacity. No significant pleural effusion. Cardiomegaly with aortic atherosclerosis. No pneumothorax. IMPRESSION: Cardiomegaly. Moderate diffuse bilateral interstitial and ground-glass opacity which may be secondary to pulmonary edema or diffuse pneumonia. Electronically Signed   By: Donavan Foil M.D.   On: 11/07/2018 19:49        Scheduled Meds: . aspirin EC  325 mg Oral Daily  . calcium acetate  2,668 mg Oral TID WC  . Chlorhexidine Gluconate Cloth  6 each Topical Q0600  . Chlorhexidine Gluconate Cloth  6 each Topical Q0600  . darbepoetin (ARANESP) injection - DIALYSIS  60 mcg Intravenous Q Wed-HD  . heparin  5,000 Units Subcutaneous Q8H  . insulin aspart  0-9 Units Subcutaneous TID WC   Continuous Infusions: . ceFEPime (MAXIPIME) IV 1 g (11/08/18 2119)  . ferric gluconate (FERRLECIT/NULECIT) IV 62.5 mg (11/09/18 1058)       LOS: 3 days     Vernell Leep, MD, FACP, [facility]. [facility] Pager [phone]  If 7PM-7AM, please contact night-coverage [email or web address] Password [email or web address] 11/09/2018, 2:12 PM

## 2018-11-09 NOTE — NC FL2 (Signed)
[place] LEVEL OF CARE SCREENING TOOL     IDENTIFICATION  Patient Name: [name] Birthdate: August 01, 1941 Sex: male Admission Date (Current Location): 11/06/2018  [facility] and [place] Number:  Herbalist and Address:  [facility] [place], [place] [address] [place], [place] [address]      Provider Number: [id number]  Attending Physician Name and Address:  Modena Jansky, MD  Relative Name and Phone Number:  [name], [phone]    Current Level of Care: Hospital Recommended Level of Care: [place] Prior Approval Number:    Date Approved/Denied:   PASRR Number: [id number]  Discharge Plan: SNF    Current Diagnoses: Patient Active Problem List   Diagnosis Date Noted  . Acute hypoxemic respiratory failure ([place]) 11/06/2018  . Volume overload 11/06/2018  . Multifocal pneumonia 11/06/2018  . Acute metabolic encephalopathy 54/56/2563  . Physical deconditioning 11/06/2018  . Acute respiratory failure with hypoxia ([place]) 11/04/2018  . CAP (community acquired pneumonia) 11/04/2018  . Pleural effusion, right 11/04/2018  . Increased ammonia level 11/04/2018  . Abnormal CT of the abdomen 11/04/2018  . Atrial fibrillation with RVR ([place]) 11/04/2018  . Sepsis ([place]) 10/21/2018  . Acute encephalopathy 10/21/2018  . Elevated alkaline phosphatase level 10/21/2018  . Chronic anemia 10/21/2018  . Thrombocytopenia ([place]) 10/21/2018  . Pulmonary HTN ([place]) 03/04/2017  . Aortic valve stenosis 03/04/2017  . Hypertension, accelerated with heart disease, without CHF 01/12/2017  . Dyspnea on exertion 01/12/2017  . End stage renal disease ([place]) 06/07/2012  . Hyperlipidemia 04/24/2011  . HYPERTENSION, BENIGN 10/24/2009  . DM (diabetes mellitus) ([place]) 10/23/2009  . OBSTRUCTIVE SLEEP APNEA 10/23/2009  . PAF (paroxysmal atrial fibrillation) ([place]) 10/23/2009  . CHF (congestive heart failure) ([place]) 10/23/2009    Orientation  RESPIRATION BLADDER Height & Weight     Self, Situation, Place  O2(Nasal cannula 2L) Continent Weight: 70.8 kg Height:  5\' 6"  (167.6 cm)  BEHAVIORAL SYMPTOMS/MOOD NEUROLOGICAL BOWEL NUTRITION STATUS      Continent Diet(Please see DC Summary)  AMBULATORY STATUS COMMUNICATION OF NEEDS Skin   Extensive Assist Verbally Other (Comment)(Wound on toe and buttocks)                       Personal Care Assistance Level of Assistance  Bathing, Feeding, Dressing Bathing Assistance: Limited assistance Feeding assistance: Independent Dressing Assistance: Limited assistance     Functional Limitations Info  Sight, Hearing, Speech Sight Info: Impaired Hearing Info: Adequate Speech Info: Adequate    SPECIAL CARE FACTORS FREQUENCY  PT (By licensed PT), OT (By licensed OT)     PT Frequency: 5x/week OT Frequency: 3x/week            Contractures Contractures Info: Not present    Additional Factors Info  Code Status, Allergies, Insulin Sliding Scale Code Status Info: Full Allergies Info: Tape   Insulin Sliding Scale Info: 3x daily with meals       Current Medications (11/09/2018):  This is the current hospital active medication list Current Facility-Administered Medications  Medication Dose Route Frequency Provider Last Rate Last Dose  . acetaminophen (TYLENOL) tablet 650 mg  650 mg Oral Q6H PRN Shela Leff, MD      . alteplase (CATHFLO ACTIVASE) injection 2 mg  2 mg Intracatheter Once PRN Dwana Melena, MD      . aspirin EC tablet 325 mg  325 mg Oral Daily Shela Leff, MD   325 mg at 11/08/18 1005  .  calcium acetate (PHOSLO) capsule 2,668 mg  2,668 mg Oral TID WC Shela Leff, MD   2,668 mg at 11/08/18 1900  . ceFEPIme (MAXIPIME) 1 g in sodium chloride 0.9 % 100 mL IVPB  1 g Intravenous Q24H Shela Leff, MD 200 mL/hr at 11/08/18 2119 1 g at 11/08/18 2119  . Chlorhexidine Gluconate Cloth 2 % PADS 6 each  6 each Topical Q0600 Dwana Melena, MD   6 each at  11/09/18 0645  . Chlorhexidine Gluconate Cloth 2 % PADS 6 each  6 each Topical Q0600 Roney Jaffe, MD   6 each at 11/09/18 0645  . Darbepoetin Alfa (ARANESP) injection 60 mcg  60 mcg Intravenous Q Wed-HD Roney Jaffe, MD   60 mcg at 11/09/18 1055  . ferric gluconate (NULECIT) 62.5 mg in sodium chloride 0.9 % 100 mL IVPB  62.5 mg Intravenous Q Wed-HD Roney Jaffe, MD 105 mL/hr at 11/09/18 1058 62.5 mg at 11/09/18 1058  . heparin injection 1,000 Units  1,000 Units Dialysis PRN Dwana Melena, MD      . heparin injection 5,000 Units  5,000 Units Subcutaneous Camelia Phenes Shela Leff, MD   5,000 Units at 11/09/18 0645  . hydrOXYzine (ATARAX/VISTARIL) tablet 25 mg  25 mg Oral Q8H PRN Shela Leff, MD      . insulin aspart (novoLOG) injection 0-9 Units  0-9 Units Subcutaneous TID WC Shela Leff, MD   1 Units at 11/08/18 1730  . ipratropium-albuterol (DUONEB) 0.5-2.5 (3) MG/3ML nebulizer solution 3 mL  3 mL Nebulization Q4H PRN Bodenheimer, Charles A, NP   3 mL at 11/07/18 1921     Discharge Medications: Please see discharge summary for a list of discharge medications.  Relevant Imaging Results:  Relevant Lab Results:   Additional Information SS#: [id number]. HD MWF at [facility].  Benard Halsted, LCSW

## 2018-11-10 DIAGNOSIS — G9341 Metabolic encephalopathy: Secondary | ICD-10-CM

## 2018-11-10 DIAGNOSIS — Z7189 Other specified counseling: Secondary | ICD-10-CM

## 2018-11-10 LAB — GLUCOSE, CAPILLARY
GLUCOSE-CAPILLARY: 112 mg/dL — AB (ref 70–99)
Glucose-Capillary: 103 mg/dL — ABNORMAL HIGH (ref 70–99)
Glucose-Capillary: 121 mg/dL — ABNORMAL HIGH (ref 70–99)
Glucose-Capillary: 147 mg/dL — ABNORMAL HIGH (ref 70–99)

## 2018-11-10 LAB — FOLATE RBC
Folate, Hemolysate: >620 ng/mL
Folate, RBC: >2288 ng/mL (ref >498)
Hematocrit: 27.1 % — ABNORMAL LOW (ref 37.5–51.0)

## 2018-11-10 MED ORDER — CHLORHEXIDINE GLUCONATE CLOTH 2 % EX PADS: 6.0000 | MEDICATED_PAD | Freq: Every day | CUTANEOUS | Status: DC

## 2018-11-10 NOTE — Progress Notes (Signed)
Patient has his left pinking toe gangrenous. MD is aware.

## 2018-11-10 NOTE — Progress Notes (Signed)
Palliative: Mr. Esterline is resting quietly in bed.  He greets me making and mostly keeping eye contact.  He is calm and cooperative, pleasant.  He is oriented to person, thinks we are in [facility], and that it is October.  When I remind him that it is November, he tells me he just had a birthday and smiles.  There is no family at bedside at this time.  We talked about the plan, hemodialysis tomorrow, discharged to SNF for rehab.  Mr. Nabozny states that he is agreeable for rehab, asking where he will go.  I share that I am not sure, but that his daughter [name] is helping to decide.  He states that he trusts her explicitly.  We talked about CODE STATUS.  I share that if. when, his heart naturally stops should we come in and press on his chest to try and restart it, a tube into his lung to breathe for him, life support?  He shakes his head and states, "no".  We talked about "treat the treatable".  I ask if his daughter can agree with that decision and he states that she does.  He agrees for me to call [name] for an update.  Call to daughter, [name] at [phone], left generic voicemail message.  Called number [phone], spoke with [name] about current health and disposition plans.  [name] states that her father has been confused for about a week or two.  She shares that he is a resident of [facility], and feels that he needs to mobile to return.  [name] states that he is expected to go to [facility] for rehab.  [name] shares her concerns about future placement, possible need for higher level of care.  I encouraged her to seek options now, realizing that things can change suddenly.  [name] and I talk about code status, she tells me that she will respect her father's choices, agrees with DNR.  All questions answered, support and encouragement given to [place].     45 minutes, extended time [name], NP Palliative medicine team [phone]

## 2018-11-10 NOTE — Progress Notes (Signed)
CSW called patient's daughter [name], and left voice message on home phone number, her mobile phone voice mail was full. CSW left voice message advising that  [facility] only had semi private rooms and requested return call.   Thurmond Butts, [place] Social Worker [phone]

## 2018-11-10 NOTE — Progress Notes (Signed)
[facility] Progress Note  Subjective: eating breakfast, cheerful, confused, no new c/o's.   Vitals:   11/10/18 0400 11/10/18 0713 11/10/18 0800 11/10/18 1124  BP: 123/73 109/62  91/60  Pulse: 94 93  87  Resp: 19 (!) 24  18  Temp:  98 F (36.7 C) 98.1 F (36.7 C) 99 F (37.2 C)  TempSrc:  Oral Oral Oral  SpO2: 100% 97%  95%  Weight:      Height:        Inpatient medications: . aspirin EC  325 mg Oral Daily  . calcium acetate  2,668 mg Oral TID WC  . Chlorhexidine Gluconate Cloth  6 each Topical Q0600  . darbepoetin (ARANESP) injection - DIALYSIS  60 mcg Intravenous Q Wed-HD  . heparin  5,000 Units Subcutaneous Q8H   . ferric gluconate (FERRLECIT/NULECIT) IV 62.5 mg (11/09/18 1058)   acetaminophen, alteplase, heparin, hydrOXYzine, ipratropium-albuterol  Iron/TIBC/Ferritin/ %Sat    Component Value Date/Time   IRON 48 06/05/2011 0844   TIBC 194 (L) 06/05/2011 0844   FERRITIN 872 (H) 06/05/2011 0844   IRONPCTSAT 25 06/05/2011 0844    Exam: Gen alert, pleasant, confused still No jvd or bruits Chest clear bilat Cor irreg irreg w harsh SEM 2-3/ 6, no diast M , no RG Abd soft ntnd no mass or ascites +bs Ext no sig LE edema Neuro is drowsy, Ox 2, nonfocal LUA AVF+bruit    Home meds:  - aspirin 325 qd/ calcium acetate 3 tid ac  - metoprolol tartrate 50 bid  - prns   Dialysis: [facility] MWF  4h  70kg  LUA AVF  2/2.5 bath  Hep 2500    - hect 2 ug tiw  - mircera 100 ug due 11/11  - venofer 50 /wk q wed  Impression/ Plan: 1. SOB - recurrent issue, was just dc'd 4-5 days ago for similar.  No fevers and no ^WBC.  Suspect this is due to vol overload.  3.3 L then 2.6 L off w/ HD x 2 here. SOB resolved. Pt still confused however.   2. Hypoxemia/ resp distress - resolved, on room air. Under dry wt.  3. AMS / confusion - persistent, unclear cause 4. ESRD - MWF. HD Friday.  5. HTN - BP's soft, holding metoprolol, will not reduce volume any  further 6. MBD ckd - cont meds 7. Anemia ckd - esa due this week, will give darbe 60 ug w/ HD today  8. Parox afib 9. OSA 10. Hx DM2 - not on any medication now     Kelly Splinter MD [place] pager [phone]   11/10/2018, 11:44 AM   Recent Labs  Lab 11/07/18 0848 11/07/18 2046 11/08/18 0016 11/09/18 0230  NA 137 136 138 137  K 4.4 3.8 3.8 4.1  CL 100 99 100 100  CO2 24 28 26 27   GLUCOSE 116* 116* 105* 94  BUN 54* 13 15 39*  CREATININE 7.01* 2.89* 3.19* 5.34*  CALCIUM 8.9 8.7* 8.8* 9.1  PHOS 4.9*  --   --   --   ALBUMIN 2.9* 3.0*  --  2.8*   Recent Labs  Lab 11/07/18 2046 11/09/18 0230  AST 26 20  ALT 27 26  ALKPHOS 198* 197*  BILITOT 1.1 0.8  PROT 7.0 6.9   Recent Labs  Lab 11/08/18 0016 11/09/18 0230  WBC 8.0 7.7  NEUTROABS 5.6 5.0  HGB 8.5* 8.7*  HCT 27.2* 28.0*  MCV 103.8* 103.7*  PLT 147* 138*

## 2018-11-10 NOTE — Progress Notes (Signed)
MD. notified for telemetry order.

## 2018-11-10 NOTE — Progress Notes (Signed)
Patient ID: [name], male   DOB: 06/27/1941, 77 y.o.   MRN: [id number]  PROGRESS NOTE    SUHAN PACI  ZJQ:[id number] DOB: Jul 23, 1941 DOA: 11/06/2018 PCP: No primary care provider on file.   Brief Narrative:  77 year old male with PMH of PAF, chronic diastolic CHF, ESRD on MWF HD, DM 2, HTN, HLD, OSA,?  On home oxygen, hospitalized 10/20/2018-11/01/2018 for acute respiratory failure with hypoxia due to volume overload versus pneumonia, sepsis, acute metabolic encephalopathy, completed a course of antibiotics, volume management across dialysis, hypoxia resolved, now presented from nursing home with dyspnea and near syncope.  Admitted again for acute hypoxic respiratory failure secondary to volume overload versus multifocal pneumonia and acute metabolic encephalopathy.  Nephrology consulted for dialysis needs.  Clinically not pneumonia and hence antibiotics stopped.  Assessment & Plan:   Principal Problem:   Acute hypoxemic respiratory failure (HCC) Active Problems:   DM (diabetes mellitus) ([place])   OBSTRUCTIVE SLEEP APNEA   HYPERTENSION, BENIGN   PAF (paroxysmal atrial fibrillation) (HCC)   ESRD (end stage renal disease) (HCC)   Acute encephalopathy   Chronic anemia   Volume overload   Multifocal pneumonia   Acute metabolic encephalopathy   Physical deconditioning   Goals of care, counseling/discussion   Palliative care by specialist   DNR (do not resuscitate) discussion   Acute hypoxic hypercapnic respiratory failure -Suspect more from ESRD and volume overload rather than pneumonia.  Volume management across HD.  Was on empiric antibiotics although low index of suspicion for pneumonia and recently completed a full week's course of antibiotics.  Antibiotics discontinued 11/13.  Blood cultures x2: Negative to date.  Chest x-ray 11/13 shows improvement. -Hypoxia resolved.  Acute on chronic diastolic CHF TTE 24/08/7352: LVEF 60-65%.  Aortic valve: Cusp separation was severely  reduced and moderate stenosis.  PA peak pressure 67 mmHg. Volume management across HD.  Consider outpatient cardiology consultation and follow-up. 11/10/2018.  This seems to have improved significantly.  Acute toxic metabolic encephalopathy -Patient's altered mental status was probably secondary to hypoxia on presentation -Patient was very altered 11/11 evening again after having received Ativan during dialysis in the afternoon.  He was hypoxic and very drowsy and had to be put on BiPAP for a few hours.  Although alert and oriented x2, remains pleasantly confused but not agitated.  Need to clarify baseline mental status with family.  Ammonia levels unremarkable. TSH and B12 normal. -Had similar thing happen to him during recent hospitalization and reportedly resolved.?  Acute illness complicating underlying dementia. -Palliative care team was consulted for goals of care, input pending. 11/10/2018: Called patient's daughter to discuss chronicity of encephalopathy.  Patient may have undiagnosed underlying dementing process.  End-stage renal disease on hemodialysis -Dialysis as per nephrology.  Seen at HD this morning.  I discussed with Dr. Jonnie Finner who also sees no indication for ongoing antibiotics.  Paroxysmal atrial fibrillation -Not on anticoagulant because of history of GI bleed.  Controlled ventricular rate.  Diabetes mellitus type II - Recent A1c 4.2 -Continue sliding scale insulin.  Had mild hypoglycemic episode on 11/12.  DC SSI and monitor CBGs.  Essential hypertension -Blood pressure on the lower side.  Beta-blocker held.  OSA - continue CPAP  Anemia of chronic disease/macrocytic anemia -Stable.  Monitor.  TSH and B12 normal.  Physical deconditioning -PT eval  Lytic lesions: Detailed evaluation done during recent hospitalization and as per documentation and DC summary 11/01/2018.  Plan as outlined in that DC summary.  SPEP 10/30/2018: Unremarkable.  Evidence of monoclonal  protein is not apparent.  Possible undiagnosed peripheral artery disease: Arterial Doppler ultrasound. Further management depend on hospital course. Consider CT head/MRI brain if no good explanation for encephalopathy.  DVT prophylaxis: Subcutaneous heparin Code Status: Full code  family Communication: None at bedside Disposition Plan: To SNF pending clinical improvement  Consultants:  Nephrology PMT-pending  Procedures:  HD  Antimicrobials:  Cefepime and vancomycin from 11/06/2018-11/13   Subjective: No new complaints. No significant history from patient. Patient is disoriented to place and time.   No fever chills.  Objective: Vitals:   11/10/18 0800 11/10/18 1124 11/10/18 1514 11/10/18 1515  BP:  91/60  (!) 97/47  Pulse:  87  86  Resp:  18  15  Temp: 98.1 F (36.7 C) 99 F (37.2 C)  98.3 F (36.8 C)  TempSrc: Oral Oral  Oral  SpO2:  95% 97% 100%  Weight:      Height:        Intake/Output Summary (Last 24 hours) at 11/10/2018 1603 Last data filed at 11/10/2018 1500 Gross per 24 hour  Intake 1351.75 ml  Output 1 ml  Net 1350.75 ml   Filed Weights   11/07/18 1217 11/09/18 0751 11/09/18 1203  Weight: 71.7 kg 70.8 kg 68.6 kg    Examination:  General exam: Pleasant elderly male.  Not in any distress. Respiratory system: Bibasilar crackles posteriorly.  No increased work of breathing. Cardiovascular system: S1-S2 heard, irregularly irregular.   Gastrointestinal system: Abdomen is obese, soft and nontender. Normal bowel sounds heard.  Stable. Extremities: No cyanosis; trace edema.  Moves all limbs symmetrically.  Left upper extremity AV fistula with ongoing dialysis. CNS: Mental status as above.  No focal neurological deficits.     Data Reviewed: I have personally reviewed following labs and imaging studies  CBC: Recent Labs  Lab 11/06/18 1855 11/06/18 1920 11/07/18 0847 11/07/18 2046 11/08/18 0016 11/09/18 0230  WBC 9.8  --  8.4 9.3 8.0 7.7    NEUTROABS  --   --   --   --  5.6 5.0  HGB 9.4* 10.2* 8.5* 8.9* 8.5* 8.7*  HCT 30.7* 30.0* 27.4* 29.8* 27.2* 28.0*  27.1*  MCV 106.2*  --  104.2* 104.9* 103.8* 103.7*  PLT 173  --  164 160 147* 063*   Basic Metabolic Panel: Recent Labs  Lab 11/06/18 1855 11/06/18 1920 11/07/18 0848 11/07/18 2046 11/08/18 0016 11/09/18 0230  NA 137 136 137 136 138 137  K 4.1 4.1 4.4 3.8 3.8 4.1  CL 97* 99 100 99 100 100  CO2 22  --  24 28 26 27   GLUCOSE 145* 143* 116* 116* 105* 94  BUN 48* 50* 54* 13 15 39*  CREATININE 6.36* 6.90* 7.01* 2.89* 3.19* 5.34*  CALCIUM 9.4  --  8.9 8.7* 8.8* 9.1  MG  --   --   --   --  2.0 2.2  PHOS  --   --  4.9*  --   --   --    GFR: Estimated Creatinine Clearance: 10.3 mL/min (A) (by C-G formula based on SCr of 5.34 mg/dL (H)). Liver Function Tests: Recent Labs  Lab 11/07/18 0848 11/07/18 2046 11/09/18 0230  AST  --  26 20  ALT  --  27 26  ALKPHOS  --  198* 197*  BILITOT  --  1.1 0.8  PROT  --  7.0 6.9  ALBUMIN 2.9* 3.0* 2.8*    Recent Labs  Lab 11/06/18 1850 11/09/18  0230  AMMONIA 40* 42*   Cardiac Enzymes: Recent Labs  Lab 11/06/18 1855  TROPONINI <0.03   CBG: Recent Labs  Lab 11/09/18 1256 11/09/18 1651 11/09/18 2120 11/10/18 0757 11/10/18 1209  GLUCAP 84 96 201* 103* 112*   Thyroid Function Tests: Recent Labs    11/09/18 0230  TSH 1.956   Anemia Panel: Recent Labs    11/09/18 0230  VITAMINB12 828     Recent Results (from the past 240 hour(s))  Blood culture (routine x 2)     Status: None (Preliminary result)   Collection Time: 11/06/18  8:30 PM  Result Value Ref Range Status   Specimen Description BLOOD RIGHT ARM  Final   Special Requests   Final    BOTTLES DRAWN AEROBIC ONLY Blood Culture adequate volume   Culture   Final    NO GROWTH 4 DAYS Performed at [facility], [place] [address] [place], [place] [address]    Report Status PENDING  Incomplete  Blood culture (routine x 2)     Status: None  (Preliminary result)   Collection Time: 11/06/18  8:50 PM  Result Value Ref Range Status   Specimen Description BLOOD RIGHT HAND  Final   Special Requests   Final    BOTTLES DRAWN AEROBIC AND ANAEROBIC Blood Culture adequate volume   Culture   Final    NO GROWTH 4 DAYS Performed at [facility], [place] [address], [place], [place] [address]    Report Status PENDING  Incomplete         Radiology Studies: Dg Chest 2 View  Result Date: 11/09/2018 CLINICAL DATA:  Shortness of breath EXAM: CHEST - 2 VIEW COMPARISON:  11/07/2018 FINDINGS: Mild cardiomegaly. Improved aeration and lung volumes. Improving bilateral airspace opacities with mild residual interstitial opacities. No visible effusions. No acute bony abnormality. IMPRESSION: Improving aeration with increasing lung volumes and decreasing patchy bilateral airspace opacities. Continued interstitial opacities/thickening. Mild cardiomegaly. Electronically Signed   By: Rolm Baptise M.D.   On: 11/09/2018 08:50        Scheduled Meds: . aspirin EC  325 mg Oral Daily  . calcium acetate  2,668 mg Oral TID WC  . Chlorhexidine Gluconate Cloth  6 each Topical Q0600  . darbepoetin (ARANESP) injection - DIALYSIS  60 mcg Intravenous Q Wed-HD  . heparin  5,000 Units Subcutaneous Q8H   Continuous Infusions: . ferric gluconate (FERRLECIT/NULECIT) IV 62.5 mg (11/09/18 1058)     LOS: 4 days     Bonnell Public, MD. [facility] Pager [phone]  If 7PM-7AM, please contact night-coverage [email or web address] Password [email or web address] 11/10/2018, 4:03 PM

## 2018-11-10 NOTE — Plan of Care (Signed)

## 2018-11-11 ENCOUNTER — Inpatient Hospital Stay (HOSPITAL_COMMUNITY): Payer: Medicare Other

## 2018-11-11 DIAGNOSIS — I739 Peripheral vascular disease, unspecified: Secondary | ICD-10-CM

## 2018-11-11 LAB — CBC
HCT: 29.1 % — ABNORMAL LOW (ref 39.0–52.0)
Hemoglobin: 8.8 g/dL — ABNORMAL LOW (ref 13.0–17.0)
MCH: 31.3 pg (ref 26.0–34.0)
MCHC: 30.2 g/dL (ref 30.0–36.0)
MCV: 103.6 fL — ABNORMAL HIGH (ref 80.0–100.0)
Platelets: 151 10*3/uL (ref 150–400)
RBC: 2.81 MIL/uL — ABNORMAL LOW (ref 4.22–5.81)
RDW: 16.4 % — ABNORMAL HIGH (ref 11.5–15.5)
WBC: 8 10*3/uL (ref 4.0–10.5)
nRBC: 0 % (ref 0.0–0.2)

## 2018-11-11 LAB — RENAL FUNCTION PANEL
Albumin: 2.9 g/dL — ABNORMAL LOW (ref 3.5–5.0)
Anion gap: 14 (ref 5–15)
BUN: 58 mg/dL — AB (ref 8–23)
CO2: 24 mmol/L (ref 22–32)
CREATININE: 6.55 mg/dL — AB (ref 0.61–1.24)
Calcium: 9.3 mg/dL (ref 8.9–10.3)
Chloride: 96 mmol/L — ABNORMAL LOW (ref 98–111)
GFR calc Af Amer: 8 mL/min — ABNORMAL LOW (ref >60)
GFR calc non Af Amer: 7 mL/min — ABNORMAL LOW (ref >60)
Glucose, Bld: 95 mg/dL (ref 70–99)
POTASSIUM: 4.1 mmol/L (ref 3.5–5.1)
Phosphorus: 2.7 mg/dL (ref 2.5–4.6)
Sodium: 134 mmol/L — ABNORMAL LOW (ref 135–145)

## 2018-11-11 LAB — CULTURE, BLOOD (ROUTINE X 2)
CULTURE: NO GROWTH
Culture: NO GROWTH
SPECIAL REQUESTS: ADEQUATE
SPECIAL REQUESTS: ADEQUATE

## 2018-11-11 LAB — GLUCOSE, CAPILLARY
Glucose-Capillary: 95 mg/dL (ref 70–99)
Glucose-Capillary: 178 mg/dL — ABNORMAL HIGH (ref 70–99)

## 2018-11-11 LAB — VITAMIN B12: Vitamin B-12: 577 pg/mL (ref 180–914)

## 2018-11-11 MED ORDER — HEPARIN SODIUM (PORCINE) 1000 UNIT/ML DIALYSIS: 2500.0000 [IU] | Freq: Once | INTRAMUSCULAR | Status: DC

## 2018-11-11 NOTE — Progress Notes (Signed)
Patient ID: [name], male   DOB: Oct 11, 1941, 77 y.o.   MRN: [id number]  PROGRESS NOTE    [name]  [id number]:[id number] DOB: [birth year] DOA: 11/06/2018 PCP: No primary care provider on file.   Brief Narrative:  77 year old male with PMH of PAF, chronic diastolic CHF, ESRD on MWF HD, DM 2, HTN, HLD, OSA,?  On home oxygen, hospitalized 10/20/2018-11/01/2018 for acute respiratory failure with hypoxia due to volume overload versus pneumonia, sepsis, acute metabolic encephalopathy, completed a course of antibiotics, volume management across dialysis, hypoxia resolved, now presented from nursing home with dyspnea and near syncope.  Admitted again for acute hypoxic respiratory failure secondary to volume overload versus multifocal pneumonia and acute metabolic encephalopathy.  Nephrology consulted for dialysis needs.  Clinically not pneumonia and hence antibiotics stopped.  11/11/2018: Made another attempt to come to the patient's daughter on [phone] and the call went to the daughter's voicemail.  Will need collateral information.  No hypo-glycemic episode today.  Echo result revealed, severe pulmonary hypertension with moderate aortic stenosis with severely impaired right ventricular function.  Will have low threshold to consult the cardiology service.  Further management will depend on hospital course.    Assessment & Plan:   Principal Problem:   Acute hypoxemic respiratory failure (HCC) Active Problems:   DM (diabetes mellitus) ([place])   OBSTRUCTIVE SLEEP APNEA   HYPERTENSION, BENIGN   PAF (paroxysmal atrial fibrillation) (HCC)   ESRD (end stage renal disease) (HCC)   Acute encephalopathy   Chronic anemia   Volume overload   Multifocal pneumonia   Acute metabolic encephalopathy   Physical deconditioning   Goals of care, counseling/discussion   Palliative care by specialist   DNR (do not resuscitate) discussion   Acute hypoxic hypercapnic respiratory failure -Suspect more from  ESRD and volume overload rather than pneumonia.  Volume management across HD.  Was on empiric antibiotics although low index of suspicion for pneumonia and recently completed a full week's course of antibiotics.  Antibiotics discontinued 11/13.  Blood cultures x2: Negative to date.  Chest x-ray 11/13 shows improvement. -Patient also has severe pulmonary hypertension and severely impaired right ventricular function.  Acute on chronic diastolic CHF TTE 12/87/8676: LVEF 60-65%.  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  PA peak pressure 67 mmHg. Volume management across HD.  Consider outpatient cardiology consultation and follow-up. Continue hemodialysis. Low threshold to consult the cardiology service for severe right heart failure with pulmonary hypertension.  Acute toxic metabolic encephalopathy -Patient's altered mental status was probably secondary to hypoxia on presentation -Patient was very altered 11/11 evening again after having received Ativan during dialysis in the afternoon.  He was hypoxic and very drowsy and had to be put on BiPAP for a few hours.  Although alert and oriented x2, remains pleasantly confused but not agitated.  Need to clarify baseline mental status with family.  Ammonia levels unremarkable. TSH and B12 normal. -Had similar thing happen to him during recent hospitalization and reportedly resolved.?  Acute illness complicating underlying dementia. -Palliative care team was consulted for goals of care, input pending. 11/10/2018: Called patient's daughter to discuss chronicity of encephalopathy.  Patient may have undiagnosed underlying dementing process. 11/11/2018: Another attempt to call patient's daughter but the call went into a voicemail.  We will continue to make effort to contact anyone who may have done the patient for a while.  End-stage renal disease on hemodialysis -Dialysis as per nephrology.   Seen at HD this morning.  Nephrology input is highly appreciated.  Paroxysmal atrial fibrillation -Not on anticoagulant because of history of GI bleed.  Controlled ventricular rate.  Diabetes mellitus type II - Recent A1c 4.2 -Continue sliding scale insulin.  Had mild hypoglycemic episode on 11/12.  DC SSI and monitor CBGs.  Essential hypertension -Blood pressure on the lower side.  Beta-blocker held.  OSA - continue CPAP  Anemia of chronic disease/macrocytic anemia -Stable.  Monitor.  TSH and B12 normal.  Physical deconditioning -PT eval  Lytic lesions: Detailed evaluation done during recent hospitalization and as per documentation and DC summary 11/01/2018.  Plan as outlined in that DC summary.  SPEP 10/30/2018: Unremarkable.  Evidence of monoclonal protein is not apparent.  Possible undiagnosed peripheral artery disease: Arterial Doppler ultrasound. Further management depend on hospital course. Consider CT head/MRI brain if no good explanation for encephalopathy.  DVT prophylaxis: Subcutaneous heparin Code Status: Full code  family Communication: None at bedside Disposition Plan: To SNF pending clinical improvement  Consultants:  Nephrology PMT-pending  Procedures:  HD  Antimicrobials:  Cefepime and vancomycin from 11/06/2018-11/13   Subjective: No new complaints. No significant history from patient. Patient is disoriented to place and time.   No fever chills.  Objective: Vitals:   11/11/18 1200 11/11/18 1210 11/11/18 1303 11/11/18 1509  BP: (!) 95/45 (!) 95/53 106/68 (!) 105/54  Pulse: 86 76 83 83  Resp:  17  20  Temp:  97.8 F (36.6 C) 98 F (36.7 C) 98 F (36.7 C)  TempSrc:  Oral Oral Oral  SpO2:  98% 98% 96%  Weight:  69.2 kg    Height:        Intake/Output Summary (Last 24 hours) at 11/11/2018 1535 Last data filed at 11/11/2018 1500 Gross per 24 hour  Intake 240 ml  Output 1500 ml  Net -1260 ml   Filed Weights   11/09/18 1203 11/11/18 0746 11/11/18 1210   Weight: 68.6 kg 70.7 kg 69.2 kg    Examination:  General exam: Pleasant elderly male.  Not in any distress. Respiratory system: Bibasilar crackles posteriorly.  No increased work of breathing. Cardiovascular system: S1-S2 heard, irregularly irregular.   Gastrointestinal system: Abdomen is obese, soft and nontender. Normal bowel sounds heard.  Stable. Extremities: No cyanosis; trace edema.  Moves all limbs symmetrically.  Left upper extremity AV fistula with ongoing dialysis. CNS: Mental status as above.  No focal neurological deficits.     Data Reviewed: I have personally reviewed following labs and imaging studies  CBC: Recent Labs  Lab 11/07/18 0847 11/07/18 2046 11/08/18 0016 11/09/18 0230 11/11/18 0814  WBC 8.4 9.3 8.0 7.7 8.0  NEUTROABS  --   --  5.6 5.0  --   HGB 8.5* 8.9* 8.5* 8.7* 8.8*  HCT 27.4* 29.8* 27.2* 28.0*  27.1* 29.1*  MCV 104.2* 104.9* 103.8* 103.7* 103.6*  PLT 164 160 147* 138* 277   Basic Metabolic Panel: Recent Labs  Lab 11/07/18 0848 11/07/18 2046 11/08/18 0016 11/09/18 0230 11/11/18 0814  NA 137 136 138 137 134*  K 4.4 3.8 3.8 4.1 4.1  CL 100 99 100 100 96*  CO2 24 28 26 27 24   GLUCOSE 116* 116* 105* 94 95  BUN 54* 13 15 39* 58*  CREATININE 7.01* 2.89* 3.19* 5.34* 6.55*  CALCIUM 8.9 8.7* 8.8* 9.1 9.3  MG  --   --  2.0 2.2  --   PHOS 4.9*  --   --   --  2.7   GFR: Estimated Creatinine  Clearance: 8.4 mL/min (A) (by C-G formula based on SCr of 6.55 mg/dL (H)). Liver Function Tests: Recent Labs  Lab 11/07/18 0848 11/07/18 2046 11/09/18 0230 11/11/18 0814  AST  --  26 20  --   ALT  --  27 26  --   ALKPHOS  --  198* 197*  --   BILITOT  --  1.1 0.8  --   PROT  --  7.0 6.9  --   ALBUMIN 2.9* 3.0* 2.8* 2.9*    Recent Labs  Lab 11/06/18 1850 11/09/18 0230  AMMONIA 40* 42*   Cardiac Enzymes: Recent Labs  Lab 11/06/18 1855  TROPONINI <0.03   CBG: Recent Labs  Lab 11/09/18 2120 11/10/18 0757 11/10/18 1209 11/10/18 1720  11/10/18 2128  GLUCAP 201* 103* 112* 121* 147*   Thyroid Function Tests: Recent Labs    11/09/18 0230  TSH 1.956   Anemia Panel: Recent Labs    11/09/18 0230  VITAMINB12 828     Recent Results (from the past 240 hour(s))  Blood culture (routine x 2)     Status: None   Collection Time: 11/06/18  8:30 PM  Result Value Ref Range Status   Specimen Description BLOOD RIGHT ARM  Final   Special Requests   Final    BOTTLES DRAWN AEROBIC ONLY Blood Culture adequate volume   Culture   Final    NO GROWTH 5 DAYS Performed at [facility], [place] [address] [place], [place] [address]    Report Status 11/11/2018 FINAL  Final  Blood culture (routine x 2)     Status: None   Collection Time: 11/06/18  8:50 PM  Result Value Ref Range Status   Specimen Description BLOOD RIGHT HAND  Final   Special Requests   Final    BOTTLES DRAWN AEROBIC AND ANAEROBIC Blood Culture adequate volume   Culture   Final    NO GROWTH 5 DAYS Performed at [facility], [place] [address] [place], [place] [address]    Report Status 11/11/2018 FINAL  Final         Radiology Studies: No results found.      Scheduled Meds: . aspirin EC  325 mg Oral Daily  . calcium acetate  2,668 mg Oral TID WC  . darbepoetin (ARANESP) injection - DIALYSIS  60 mcg Intravenous Q Wed-HD  . heparin  5,000 Units Subcutaneous Q8H   Continuous Infusions: . ferric gluconate (FERRLECIT/NULECIT) IV 62.5 mg (11/09/18 1058)     LOS: 5 days     Bonnell Public, MD. [facility] Pager [phone]  If 7PM-7AM, please contact night-coverage [email or web address] Password [email or web address] 11/11/2018, 3:35 PM

## 2018-11-11 NOTE — Progress Notes (Signed)
PT Cancellation Note  Patient Details Name: [name] MRN: [id number] DOB: [birth year]   Cancelled Treatment:    Reason Eval/Treat Not Completed: Patient declined, no reason specified. Politely declining therapy services at this time. Will follow.  Ellamae Sia, PT, DPT Acute Rehabilitation Services Pager [phone] Office [phone]    Willy Eddy 11/11/2018, 4:13 PM

## 2018-11-11 NOTE — Progress Notes (Signed)
VASCULAR LAB PRELIMINARY  ARTERIAL  ABI completed:    RIGHT    LEFT    PRESSURE WAVEFORM  PRESSURE WAVEFORM  BRACHIAL 109 triphasic BRACHIAL Unable to obtain due to fistula   DP 62 monophasic DP Not audible          PT 69 monophasic PT 63 monophasic                  RIGHT LEFT  ABI 0.63 0.58   Bilateral ABI's indicate moderate lower extremity arterial disease.   Garnet Sierras 11/11/2018, 2:51 PM

## 2018-11-11 NOTE — Progress Notes (Addendum)
CSW aware that pt may be ready to go to [facility] today. CSW spoke with Lexine Baton with [facility]. Lexine Baton confirmed that pt can come today. CSW attempted to update family. CSW unable to leave voicemail for daughter [name] on her cell phone [phone]), voicemail is full. CSW left HIPAA complaint voicemail on [name]'s home voicemail [phone]).   CSW made 2 additional attempts to contact pt's daughter at the numbers listed above. CSW will continue to reach out to pt's family.   CSW spoke with pt at pt's bedside. Pt stated he was a little drowsy, but feeling good. Pt is agreeable to return to [facility] upon discharge. Pt stated his daughter is a Education officer, museum and gave CSW gave verbal permission to leave voicemail updating her on his discharge plans. Pt expressed interest in transitioning to ALF after his stay at SNF.   Update: CSW spoke with RN. Pt taken for additional test, pt unlikely ready for discharge today. CSW updated SNF. SNF aware pt may be ready over the weekend.   Wendelyn Breslow, [facility]  [phone]

## 2018-11-11 NOTE — Progress Notes (Addendum)
[name] Progress Note  Subjective: on HD , BP's dropping, not tolerating much UF.   Vitals:   11/11/18 1000 11/11/18 1030 11/11/18 1100 11/11/18 1130  BP: (!) 102/55 (!) 102/50 (!) 91/42 (!) (P) 85/42  Pulse: 83 82 82 (P) 74  Resp:      Temp:      TempSrc:      SpO2:      Weight:      Height:        Inpatient medications: . aspirin EC  325 mg Oral Daily  . calcium acetate  2,668 mg Oral TID WC  . Chlorhexidine Gluconate Cloth  6 each Topical Q0600  . darbepoetin (ARANESP) injection - DIALYSIS  60 mcg Intravenous Q Wed-HD  . [START ON 11/12/2018] heparin  2,500 Units Dialysis Once in dialysis  . heparin  5,000 Units Subcutaneous Q8H   . ferric gluconate (FERRLECIT/NULECIT) IV 62.5 mg (11/09/18 1058)   acetaminophen, alteplase, heparin, hydrOXYzine, ipratropium-albuterol  Iron/TIBC/Ferritin/ %Sat    Component Value Date/Time   IRON 48 06/05/2011 0844   TIBC 194 (L) 06/05/2011 0844   FERRITIN 872 (H) 06/05/2011 0844   IRONPCTSAT 25 06/05/2011 0844    Exam: Gen alert, pleasant, confused still No jvd or bruits Chest clear bilat Cor irreg irreg w harsh SEM 2-3/ 6, no diast M , no RG Abd soft ntnd no mass or ascites +bs Ext no sig LE edema Neuro Ox 2, nonfocal LUA AVF+bruit    Home meds:  - aspirin 325 qd/ calcium acetate 3 tid ac  - metoprolol tartrate 50 bid  - prns    CXR 11/11 - poor film  CXR 11/13 - bilat edema  CXR 11/14 - improved edema, looks resolved to me  Dialysis: [facility] MWF  4h  70kg  LUA AVF  2/2.5 bath  Hep 2500    - hect 2 ug tiw  - mircera 100 ug due 11/11  - venofer 50 /wk q wed  Impression/ Plan: 1. SOB - recurrent issue, was just dc'd 4-5 days ago for similar. Not sure cause, was suspecting vol overload but we haven't gotten wt's down much here.  Afeb/ normal WBC. CXR has improved w/ some abx and volume removal.  Will d/w primary.  2. Hypoxemia/ resp distress - CXR 11/14 looks normal, better.  ECHO showed  severe RV dysfunction and question AoV disease (poor study for that). LVEF 60%.    3. AMS / confusion - persistent, unclear cause 4. ESRD - MWF. HD today. Need stand wt after HD.  5. HTN - BP's soft, holding metoprolol. Not tolerating UF today.  6. MBD ckd - cont meds 7. Anemia ckd - esa due this week, gave darbe 60 ug 11/13 8. Parox afib 9. OSA 10. Hx DM2 - not on any medication now     Kelly Splinter MD [place] pager [phone]   11/11/2018, 11:46 AM   Recent Labs  Lab 11/07/18 0848  11/09/18 0230 11/11/18 0814  NA 137   < > 137 134*  K 4.4   < > 4.1 4.1  CL 100   < > 100 96*  CO2 24   < > 27 24  GLUCOSE 116*   < > 94 95  BUN 54*   < > 39* 58*  CREATININE 7.01*   < > 5.34* 6.55*  CALCIUM 8.9   < > 9.1 9.3  PHOS 4.9*  --   --  2.7  ALBUMIN 2.9*   < >  2.8* 2.9*   < > = values in this interval not displayed.   Recent Labs  Lab 11/07/18 2046 11/09/18 0230  AST 26 20  ALT 27 26  ALKPHOS 198* 197*  BILITOT 1.1 0.8  PROT 7.0 6.9   Recent Labs  Lab 11/08/18 0016 11/09/18 0230 11/11/18 0814  WBC 8.0 7.7 8.0  NEUTROABS 5.6 5.0  --   HGB 8.5* 8.7* 8.8*  HCT 27.2* 28.0*  27.1* 29.1*  MCV 103.8* 103.7* 103.6*  PLT 147* 138* 151

## 2018-11-11 NOTE — Progress Notes (Signed)
Patient returned to 2c06 from HD. Patient sleepy. Vss. See chart. Call bell within reach and bed alarm set. Will continue to monitor.

## 2018-11-12 DIAGNOSIS — G9340 Encephalopathy, unspecified: Secondary | ICD-10-CM

## 2018-11-12 DIAGNOSIS — I639 Cerebral infarction, unspecified: Secondary | ICD-10-CM

## 2018-11-12 LAB — LIPID PANEL
Cholesterol: 113 mg/dL (ref 0–200)
HDL: 39 mg/dL — ABNORMAL LOW (ref >40)
LDL Cholesterol: 62 mg/dL (ref 0–99)
Total CHOL/HDL Ratio: 2.9 RATIO
Triglycerides: 60 mg/dL (ref <150)
VLDL: 12 mg/dL (ref 0–40)

## 2018-11-12 LAB — HEMOGLOBIN A1C
Hgb A1c MFr Bld: 4.2 % — ABNORMAL LOW (ref 4.8–5.6)
Mean Plasma Glucose: 73.84 mg/dL

## 2018-11-12 LAB — GLUCOSE, CAPILLARY
Glucose-Capillary: 110 mg/dL — ABNORMAL HIGH (ref 70–99)
Glucose-Capillary: 128 mg/dL — ABNORMAL HIGH (ref 70–99)
Glucose-Capillary: 133 mg/dL — ABNORMAL HIGH (ref 70–99)
Glucose-Capillary: 118 mg/dL — ABNORMAL HIGH (ref 70–99)

## 2018-11-12 NOTE — Plan of Care (Signed)

## 2018-11-12 NOTE — Progress Notes (Signed)
Patient ID: [name], male   DOB: 1941-02-17, 77 y.o.   MRN: [id number]  PROGRESS NOTE    [name]  HER:[id number] DOB: [birth year] DOA: 11/06/2018 PCP: No primary care provider on file.   Brief Narrative:  77 year old male with PMH of PAF, chronic diastolic CHF, ESRD on MWF HD, DM 2, HTN, HLD, OSA,?  On home oxygen, hospitalized 10/20/2018-11/01/2018 for acute respiratory failure with hypoxia due to volume overload versus pneumonia, sepsis, acute metabolic encephalopathy, completed a course of antibiotics, volume management across dialysis, hypoxia resolved, now presented from nursing home with dyspnea and near syncope.  Admitted again for acute hypoxic respiratory failure secondary to volume overload versus multifocal pneumonia and acute metabolic encephalopathy.  Nephrology consulted for dialysis needs.  Clinically not pneumonia and hence antibiotics stopped.  11/11/2018: Made another attempt to come to the patient's daughter on [phone] and the call went to the daughter's voicemail.  Will need collateral information.  No hypo-glycemic episode today.  Echo result revealed, severe pulmonary hypertension with moderate aortic stenosis with severely impaired right ventricular function.  Will have low threshold to consult the cardiology service.  Further management will depend on hospital course.    11/12/2018: Discussed with the patient's daughter over the phone.  According to the daughter, patient has had memory problems over the last couple of years.  Patient has not been able to recognize some extended family members.  Significant short-term memory is also reported.  The daughter tells me that formal diagnosis of dementia has not been made.  Patient underwent MRI of the brain that revealed an acute infarct.  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  The daughter also tells me patient was on Coumadin, but this had to be discontinued.  Exact reason for discontinuing Coumadin was not known to  the daughter.  Patient is on aspirin.  Neurology team has been consulted.  Meanwhile, patient is currently on room air.  Assessment & Plan:   Principal Problem:   Acute hypoxemic respiratory failure (HCC) Active Problems:   DM (diabetes mellitus) ([place])   OBSTRUCTIVE SLEEP APNEA   HYPERTENSION, BENIGN   PAF (paroxysmal atrial fibrillation) (HCC)   ESRD (end stage renal disease) (HCC)   Acute encephalopathy   Chronic anemia   Volume overload   Multifocal pneumonia   Acute metabolic encephalopathy   Physical deconditioning   Goals of care, counseling/discussion   Palliative care by specialist   DNR (do not resuscitate) discussion   Acute CVA: Stat neurology consult done. Discussed personally with the neurologist, Dr. Lorraine Lax.  Dr. Lorraine Lax will see patient in consultation. Check lipid profile Check hemoglobin A1c Will defer further management of the acute CVA, including further imaging studies, to the neurology team. Continue aspirin for now. Hopefully, the palliative care team will assist in defining the goal of care.  Goal of care will determine extent of work-up.  Acute hypoxic hypercapnic respiratory failure -Suspect more from ESRD and volume overload rather than pneumonia.  Volume management across HD.  Was on empiric antibiotics although low index of suspicion for pneumonia and recently completed a full week's course of antibiotics.  Antibiotics discontinued 11/13.  Blood cultures x2: Negative to date.  Chest x-ray 11/13 shows improvement. -Patient also has severe pulmonary hypertension and severely impaired right ventricular function. 11/12/2018: Patient is currently on room air.  Shortness of breath was most likely secondary to pulmonary edema.   Continue hemodialysis.  Acute on chronic diastolic CHF TTE 63/78/5885: LVEF 60-65%.  Aortic valve: Cusp  separation was severely reduced and moderate stenosis.  PA peak pressure 67 mmHg. Volume management across HD.  Consider  outpatient cardiology consultation and follow-up. Continue hemodialysis. Low threshold to consult the cardiology service for severe right heart failure with pulmonary hypertension.  Acute toxic metabolic encephalopathy -Patient's altered mental status was probably secondary to hypoxia on presentation -Patient was very altered 11/11 evening again after having received Ativan during dialysis in the afternoon.  He was hypoxic and very drowsy and had to be put on BiPAP for a few hours.  Although alert and oriented x2, remains pleasantly confused but not agitated.  Need to clarify baseline mental status with family.  Ammonia levels unremarkable. TSH and B12 normal. -Had similar thing happen to him during recent hospitalization and reportedly resolved.?  Acute illness complicating underlying dementia. -Palliative care team was consulted for goals of care, input pending. 11/10/2018: Called patient's daughter to discuss chronicity of encephalopathy.  Patient may have undiagnosed underlying dementing process. 11/11/2018: Another attempt to call patient's daughter but the call went into a voicemail.  We will continue to make effort to contact anyone who may have done the patient for a while. 11/12/2018: MRI brain revealed acute CVA.  Collateral information is suggestive of underlying dementing process, likely vascular dementia.  End-stage renal disease on hemodialysis -Dialysis as per nephrology.   Nephrology input is highly appreciated.  Paroxysmal atrial fibrillation -Not on anticoagulant because of history of GI bleed.  Controlled ventricular rate. 11/12/2018: Patient is currently in normal sinus rhythm.  Rate is also controlled.  Diabetes mellitus type II - Recent A1c 4.2 -Continue sliding scale insulin.  Had mild hypoglycemic episode on 11/12.  DC SSI and monitor CBGs.  Essential hypertension -Blood pressure on the lower side.  Beta-blocker held.  OSA - continue CPAP  Anemia of chronic  disease/macrocytic anemia -Stable.  Monitor.  TSH and B12 normal.  Physical deconditioning -PT eval  Lytic lesions: Detailed evaluation done during recent hospitalization and as per documentation and DC summary 11/01/2018.  Plan as outlined in that DC summary.  SPEP 10/30/2018: Unremarkable.  Evidence of monoclonal protein is not apparent.  Possible undiagnosed peripheral artery disease: Result of arterial Doppler ultrasound of the extremities still pending.  DVT prophylaxis: Subcutaneous heparin Code Status: Full code  family Communication: None at bedside Disposition Plan: To SNF pending clinical improvement  Consultants:  Nephrology PMT-pending  Procedures:  HD  Antimicrobials:  Cefepime and vancomycin from 11/06/2018-11/13   Subjective: No new complaints. No significant history from patient. Patient is disoriented to place and time.   No fever chills.  Objective: Vitals:   11/11/18 2234 11/11/18 2321 11/12/18 0306 11/12/18 0720  BP:  (!) 106/59 124/60 (!) 141/72  Pulse: 73 81 91 91  Resp:  15 17 18   Temp:  98.1 F (36.7 C) 98.1 F (36.7 C) 97.7 F (36.5 C)  TempSrc:  Oral Oral Oral  SpO2: 97% 95% 100% 98%  Weight:      Height:        Intake/Output Summary (Last 24 hours) at 11/12/2018 1037 Last data filed at 11/11/2018 1800 Gross per 24 hour  Intake 240 ml  Output 1502 ml  Net -1262 ml   Filed Weights   11/09/18 1203 11/11/18 0746 11/11/18 1210  Weight: 68.6 kg 70.7 kg 69.2 kg    Examination:  General exam: Pleasant elderly male.  Not in any distress. Respiratory system: Bibasilar crackles posteriorly.  No increased work of breathing. Cardiovascular system: S1-S2 heard, irregularly irregular.  Gastrointestinal system: Abdomen is obese, soft and nontender. Normal bowel sounds heard.  Stable. Extremities: No cyanosis; trace edema.  Moves all limbs symmetrically.  Left upper extremity AV fistula with ongoing dialysis. CNS: Disoriented to time  place.  No focal neurological deficits.  Power is full in all extremities   Data Reviewed: I have personally reviewed following labs and imaging studies  CBC: Recent Labs  Lab 11/07/18 0847 11/07/18 2046 11/08/18 0016 11/09/18 0230 11/11/18 0814  WBC 8.4 9.3 8.0 7.7 8.0  NEUTROABS  --   --  5.6 5.0  --   HGB 8.5* 8.9* 8.5* 8.7* 8.8*  HCT 27.4* 29.8* 27.2* 28.0*  27.1* 29.1*  MCV 104.2* 104.9* 103.8* 103.7* 103.6*  PLT 164 160 147* 138* 935   Basic Metabolic Panel: Recent Labs  Lab 11/07/18 0848 11/07/18 2046 11/08/18 0016 11/09/18 0230 11/11/18 0814  NA 137 136 138 137 134*  K 4.4 3.8 3.8 4.1 4.1  CL 100 99 100 100 96*  CO2 24 28 26 27 24   GLUCOSE 116* 116* 105* 94 95  BUN 54* 13 15 39* 58*  CREATININE 7.01* 2.89* 3.19* 5.34* 6.55*  CALCIUM 8.9 8.7* 8.8* 9.1 9.3  MG  --   --  2.0 2.2  --   PHOS 4.9*  --   --   --  2.7   GFR: Estimated Creatinine Clearance: 8.4 mL/min (A) (by C-G formula based on SCr of 6.55 mg/dL (H)). Liver Function Tests: Recent Labs  Lab 11/07/18 0848 11/07/18 2046 11/09/18 0230 11/11/18 0814  AST  --  26 20  --   ALT  --  27 26  --   ALKPHOS  --  198* 197*  --   BILITOT  --  1.1 0.8  --   PROT  --  7.0 6.9  --   ALBUMIN 2.9* 3.0* 2.8* 2.9*    Recent Labs  Lab 11/06/18 1850 11/09/18 0230  AMMONIA 40* 42*   Cardiac Enzymes: Recent Labs  Lab 11/06/18 1855  TROPONINI <0.03   CBG: Recent Labs  Lab 11/10/18 1720 11/10/18 2128 11/11/18 1645 11/11/18 2121 11/12/18 0807  GLUCAP 121* 147* 95 178* 110*   Thyroid Function Tests: No results for input(s): TSH, T4TOTAL, FREET4, T3FREE, THYROIDAB in the last 72 hours. Anemia Panel: Recent Labs    11/11/18 1920  VITAMINB12 577     Recent Results (from the past 240 hour(s))  Blood culture (routine x 2)     Status: None   Collection Time: 11/06/18  8:30 PM  Result Value Ref Range Status   Specimen Description BLOOD RIGHT ARM  Final   Special Requests   Final    BOTTLES  DRAWN AEROBIC ONLY Blood Culture adequate volume   Culture   Final    NO GROWTH 5 DAYS Performed at [facility], [address] [place], [place] [address]    Report Status 11/11/2018 FINAL  Final  Blood culture (routine x 2)     Status: None   Collection Time: 11/06/18  8:50 PM  Result Value Ref Range Status   Specimen Description BLOOD RIGHT HAND  Final   Special Requests   Final    BOTTLES DRAWN AEROBIC AND ANAEROBIC Blood Culture adequate volume   Culture   Final    NO GROWTH 5 DAYS Performed at [facility], [place] [address], [place], [place] [address]    Report Status 11/11/2018 FINAL  Final  Radiology Studies: Mr Brain Wo Contrast  Result Date: 11/11/2018 CLINICAL DATA:  Initial evaluation for acute encephalopathy. EXAM: MRI HEAD WITHOUT CONTRAST TECHNIQUE: Multiplanar, multiecho pulse sequences of the brain and surrounding structures were obtained without intravenous contrast. COMPARISON:  Prior CT from 10/20/2018. FINDINGS: Brain: Examination moderately degraded by motion artifact. Diffuse prominence of the CSF containing spaces compatible with generalized cerebral atrophy. Patchy and confluent T2/FLAIR hyperintensity within the periventricular and deep white matter both cerebral hemispheres, consistent with chronic small vessel ischemic disease, moderate nature. Multiple superimposed remote lacunar infarcts within the bilateral basal ganglia and thalami. Associated chronic hemosiderin staining about several of these infarcts. Small remote cortical infarct at the left occipital lobe. 5 mm focus of diffusion abnormality at the posterior right corona radiata consistent with an acute ischemic small vessel type infarct (series 3, image 31). No associated hemorrhage or mass effect. No other evidence for acute or subacute ischemia. Gray-white matter differentiation otherwise maintained. No acute intracranial hemorrhage. Several scattered chronic micro hemorrhages seen  clustered about the deep gray nuclei and brainstem, most like related to chronic underlying hypertension. No mass lesion, midline shift or mass effect. Diffuse ventricular prominence related global parenchymal volume loss of hydrocephalus. No extra-axial fluid collection. Pituitary gland grossly within normal limits. Vascular: Major intracranial vascular flow voids maintained. Skull and upper cervical spine: Craniocervical junction within normal limits. No focal marrow replacing lesion. Scalp soft tissues unremarkable. Sinuses/Orbits: Patient status post bilateral ocular lens replacement. Paranasal sinuses are largely clear. Bilateral mastoid effusions noted, right larger than left. Other: None. IMPRESSION: 1. 5 mm acute ischemic nonhemorrhagic small vessel type infarct involving the posterior right corona radiata. 2. Underlying moderate cerebral atrophy with chronic small vessel ischemic disease with multiple remote lacunar infarcts about the bilateral basal ganglia and thalami. 3. Additional remote left occipital cortical infarct. 4. Scattered chronic micro hemorrhages centered about the deep gray nuclei, likely due to chronic uncontrolled hypertension. Electronically Signed   By: Jeannine Boga M.D.   On: 11/11/2018 20:52        Scheduled Meds: . aspirin EC  325 mg Oral Daily  . calcium acetate  2,668 mg Oral TID WC  . darbepoetin (ARANESP) injection - DIALYSIS  60 mcg Intravenous Q Wed-HD  . heparin  5,000 Units Subcutaneous Q8H   Continuous Infusions: . ferric gluconate (FERRLECIT/NULECIT) IV 62.5 mg (11/09/18 1058)     LOS: 6 days     Bonnell Public, MD. [facility] Pager [phone]  If 7PM-7AM, please contact night-coverage [email or web address] Password [facility] 11/12/2018, 10:37 AM

## 2018-11-12 NOTE — Progress Notes (Signed)
Weekend Calorie Count:  Pt assisted OOB to chair for all meals. He feed himself and  eats 100% each meal once he is set up.Maximum assist by 2 people in transfers. Awaiting PT evaluation to determine best progression of care post stroke.

## 2018-11-12 NOTE — Progress Notes (Signed)
Patient converted to NSR at a rate 35f 90-91.

## 2018-11-12 NOTE — Consult Note (Addendum)
NEURO HOSPITALIST  CONSULT   Requesting Physician: Dr. Marthenia Rolling    Chief Complaint: SOB/ near syncope  History obtained from:  Patient   HPI:                                                                                                                                         [name] is an 77 y.o. male PMH CHF, ESRD ( on dialysis MWF), DM, HTN, HLD,a fib ( not anticoagulated), OSA presented to [facility] on 11/06/18 for SOB and near syncope. Neurology consulted 11/12/18 for stroke seen on MRI.   Per chart review patient initially presented to [facility] for SOB and near syncope. According to his daughter patient has had some  memory problems for the past year. Not remembering some extended family members. He has not been formally diagnosed with dementia. Patient had MRI for AMS. Denies any focal weakness, numbness, tingling, diplopia. Patient does smoke cigarettes, but has not since hospital admission. Denies ETOH or drug use. Endorses bilateral leg weakness that has been present for some time. It did start in his left leg and progressed to both legs. Also endorses blurred vision. Patient said he had a gradual onset of blurry vision that has also been present for awhile.   Hospital course:  11/10: admitted for SOB, near syncope 11/13 noted to have confusion; patient receiving dialysis 11/15: MRI brain 11/16 neurology consulted for stroke seen on MRI  LKW: unknown tPA Given: No: outside of window Modified Rankin: Rankin Score=1 NIHSS:1; dysarthria   Past Medical History:  Diagnosis Date  . A-fib ([place])   . Anemia   . Blood transfusion   . BPH (benign prostatic hyperplasia)   . CHF (congestive heart failure) ([place])   . Diarrhea   . DM (diabetes mellitus) ([place])   . ESRD on hemodialysis ([place])    Started dialysis in 2009  . History of GI bleed    secondary to coumadin  . HTN (hypertension)   . Hyperlipidemia   . OSA (obstructive sleep apnea)     uses CPAP  . Secondary hyperparathyroidism of renal origin [facility])     Past Surgical History:  Procedure Laterality Date  . BVT  02/05/46   Left  Basilic Vein Transposition  . CHOLECYSTECTOMY    . EYE SURGERY     Catarct bil  . INSERTION OF DIALYSIS CATHETER  05/28/2012   Procedure: INSERTION OF DIALYSIS CATHETER;  Surgeon: Mal Misty, MD;  Location: [place];  Service: Vascular;  Laterality: Right;  . Left arm shuntogram.    . Left forearm loop graft with 6 mm Gore-Tex graft.    Marland Kitchen  Pars plana vitrectomy with 25-gauge system      Family History  Problem Relation Age of Onset  . Alzheimer's disease Mother   . Diabetes Father        Amputation:  bilateral legs  . Cancer Daughter        breast cancer  . Diabetes Son   . Heart disease Son        before age 77  . Hypertension Son   . Anesthesia problems Neg Hx          Social History:  reports that he quit smoking about 14 years ago. His smoking use included cigarettes. He has never used smokeless tobacco. He reports that he does not drink alcohol or use drugs.  Allergies:  Allergies  Allergen Reactions  . Tape Itching and Other (See Comments)    Cloth tape only    Medications:                                                                                                                           Scheduled: . aspirin EC  325 mg Oral Daily  . calcium acetate  2,668 mg Oral TID WC  . darbepoetin (ARANESP) injection - DIALYSIS  60 mcg Intravenous Q Wed-HD  . heparin  5,000 Units Subcutaneous Q8H   Continuous: . ferric gluconate (FERRLECIT/NULECIT) IV 62.5 mg (11/09/18 1058)   NWG:[id number], hydrOXYzine, ipratropium-albuterol   ROS:                                                                                                                                       ROS was performed and is negative except as noted in HPI    General Examination:                                                                                                       Blood pressure 126/61, pulse 93, temperature 98.2 F (36.8 C), temperature source Oral, resp.  rate 15, height 5\' 6"  (1.676 m), weight 69.2 kg, SpO2 97 %.  HEENT-  Normocephalic, no lesions, without obvious abnormality.  Normal external eye and conjunctiva. Cardiovascular- S1-S2 audible, pulses palpable throughout  Lungs-no rhonchi or wheezing noted, no excessive working breathing.  Saturations within normal limits Abdomen- All 4 quadrants palpated and nontender Extremities- Warm, dry and intact Musculoskeletal-no joint tenderness, deformity or swelling Skin-warm and dry, no hyperpigmentation, vitiligo, or suspicious lesions  Neurological Examination Mental Status: Alert, oriented, thought content appropriate.  Speech fluent without evidence of aphasia. Dysarthria noted.   Able to follow commands without difficulty. Cranial Nerves: II:  Visual fields grossly normal,  III,IV, VI: ptosis not present, extra-ocular motions intact bilaterally, pupils equal, round, reactive to light and accommodation V,VII: smile symmetric, facial light touch sensation normal bilaterally VIII: hearing normal bilaterally IX,X: uvula rises symmetrically XI: bilateral shoulder shrug XII: midline tongue extension Motor: Right : Upper extremity   5/5 Left:     Upper extremity   5/5  Lower extremity   5/5  Lower extremity   5/5 Tone and bulk:normal tone throughout; no atrophy noted Sensory:  light touch intact throughout, bilaterally Plantars: Right: downgoing   Left: downgoing Cerebellar: normal finger-to-nose,  normal heel-to-shin test Gait: deferred   Lab Results: Basic Metabolic Panel: Recent Labs  Lab 11/07/18 0848 11/07/18 2046 11/08/18 0016 11/09/18 0230 11/11/18 0814  NA 137 136 138 137 134*  K 4.4 3.8 3.8 4.1 4.1  CL 100 99 100 100 96*  CO2 24 28 26 27 24   GLUCOSE 116* 116* 105* 94 95  BUN 54* 13 15 39* 58*  CREATININE 7.01* 2.89* 3.19* 5.34* 6.55*  CALCIUM  8.9 8.7* 8.8* 9.1 9.3  MG  --   --  2.0 2.2  --   PHOS 4.9*  --   --   --  2.7    CBC: Recent Labs  Lab 11/07/18 0847 11/07/18 2046 11/08/18 0016 11/09/18 0230 11/11/18 0814  WBC 8.4 9.3 8.0 7.7 8.0  NEUTROABS  --   --  5.6 5.0  --   HGB 8.5* 8.9* 8.5* 8.7* 8.8*  HCT 27.4* 29.8* 27.2* 28.0*  27.1* 29.1*  MCV 104.2* 104.9* 103.8* 103.7* 103.6*  PLT 164 160 147* 138* 151    Lipid Panel: Recent Labs  Lab 11/11/18 1517  CHOL 113  TRIG 60  HDL 39*  CHOLHDL 2.9  VLDL 12  LDLCALC 62    CBG: Recent Labs  Lab 11/10/18 2128 11/11/18 1645 11/11/18 2121 11/12/18 0807 11/12/18 1233  GLUCAP 147* 95 178* 110* 128*    Imaging: Mr Brain Wo Contrast  Result Date: 11/11/2018 CLINICAL DATA:  Initial evaluation for acute encephalopathy. EXAM: MRI HEAD WITHOUT CONTRAST TECHNIQUE: Multiplanar, multiecho pulse sequences of the brain and surrounding structures were obtained without intravenous contrast. COMPARISON:  Prior CT from 10/20/2018. FINDINGS: Brain: Examination moderately degraded by motion artifact. Diffuse prominence of the CSF containing spaces compatible with generalized cerebral atrophy. Patchy and confluent T2/FLAIR hyperintensity within the periventricular and deep white matter both cerebral hemispheres, consistent with chronic small vessel ischemic disease, moderate nature. Multiple superimposed remote lacunar infarcts within the bilateral basal ganglia and thalami. Associated chronic hemosiderin staining about several of these infarcts. Small remote cortical infarct at the left occipital lobe. 5 mm focus of diffusion abnormality at the posterior right corona radiata consistent with an acute ischemic small vessel type infarct (series 3, image 31). No associated hemorrhage or mass effect. No other evidence for acute or subacute  ischemia. Gray-white matter differentiation otherwise maintained. No acute intracranial hemorrhage. Several scattered chronic micro hemorrhages seen  clustered about the deep gray nuclei and brainstem, most like related to chronic underlying hypertension. No mass lesion, midline shift or mass effect. Diffuse ventricular prominence related global parenchymal volume loss of hydrocephalus. No extra-axial fluid collection. Pituitary gland grossly within normal limits. Vascular: Major intracranial vascular flow voids maintained. Skull and upper cervical spine: Craniocervical junction within normal limits. No focal marrow replacing lesion. Scalp soft tissues unremarkable. Sinuses/Orbits: Patient status post bilateral ocular lens replacement. Paranasal sinuses are largely clear. Bilateral mastoid effusions noted, right larger than left. Other: None. IMPRESSION: 1. 5 mm acute ischemic nonhemorrhagic small vessel type infarct involving the posterior right corona radiata. 2. Underlying moderate cerebral atrophy with chronic small vessel ischemic disease with multiple remote lacunar infarcts about the bilateral basal ganglia and thalami. 3. Additional remote left occipital cortical infarct. 4. Scattered chronic micro hemorrhages centered about the deep gray nuclei, likely due to chronic uncontrolled hypertension. Electronically Signed   By: Jeannine Boga M.D.   On: 11/11/2018 20:52       Laurey Morale, MSN, NP-C [facility] [phone]  11/12/2018, 4:08 PM   Attending physician note to follow with Assessment and plan .  I have seen the patient and reviewed the above note.  Assessment: 77 y.o. male PMH CHF, ESRD ( on dialysis MWF), DM, HTN, HLD,a fib ( not anticoagulated), OSA presented to [facility] on 11/06/18 for SOB and near syncope.   I suspect that his altered mental status is multifactorial and metabolic, primarily associated with his hypoxemia.  This seems to be improved.  The stroke that was seen, could be playing some small role but it is evidence that he needs his risk factors better control.  He does have a history of atrial  fibrillation, and there is some question of whether he should be anticoagulated, my suspicion is that this is a small vessel ischemic stroke, but this is not definite.  May need to discuss with GI about the appropriateness for restarting anticoagulation.  Impression: Stroke: Stroke Risk Factors - diabetes mellitus, hyperlipidemia and hypertension    Recommendations: -- BP goal : Permissive HTN upto 220/110 mmHg --carotid dopplers --Echocardiogram was just performed on 11/12 with no clear source of embolus, I do not think this needs to be repeated -- ASA -- High intensity Statin if LDL > 70 -- HgbA1c, fasting lipid panel -- PT consult, OT consult, Speech consult --Telemetry monitoring --Frequent neuro checks --Stroke swallow screen  --smoking cessation   --please page stroke NP  Or  PA  Or MD from 8am -4 pm  as this patient from this time will be  followed by the stroke.   You can look them up on [email or web address]  Password [email or web address]  Roland Rack, MD [facility] [phone]  If 7pm- 7am, please page neurology on call as listed in [place].

## 2018-11-12 NOTE — Progress Notes (Signed)
[facility] Progress Note  Subjective: MRI brain showed multiple old CVA's and acute CVA (5 mm acute ischemic nonhemorrhagic small vessel type infarct involving the posterior right corona radiata). Patient remains confused, up in chair, no O2.   Vitals:   11/11/18 2321 11/12/18 0306 11/12/18 0720 11/12/18 1112  BP: (!) 106/59 124/60 (!) 141/72 126/61  Pulse: 81 91 91 93  Resp: 15 17 18 15   Temp: 98.1 F (36.7 C) 98.1 F (36.7 C) 97.7 F (36.5 C) 98.2 F (36.8 C)  TempSrc: Oral Oral Oral Oral  SpO2: 95% 100% 98% 97%  Weight:      Height:        Inpatient medications: . aspirin EC  325 mg Oral Daily  . calcium acetate  2,668 mg Oral TID WC  . darbepoetin (ARANESP) injection - DIALYSIS  60 mcg Intravenous Q Wed-HD  . heparin  5,000 Units Subcutaneous Q8H   . ferric gluconate (FERRLECIT/NULECIT) IV 62.5 mg (11/09/18 1058)   acetaminophen, hydrOXYzine, ipratropium-albuterol  Iron/TIBC/Ferritin/ %Sat    Component Value Date/Time   IRON 48 06/05/2011 0844   TIBC 194 (L) 06/05/2011 0844   FERRITIN 872 (H) 06/05/2011 0844   IRONPCTSAT 25 06/05/2011 0844    Exam: Gen alert, pleasant, confused still No jvd or bruits Chest clear bilat Cor irreg irreg w harsh SEM 2-3/ 6, no diast M , no RG Abd soft ntnd no mass or ascites +bs Ext no sig LE edema Neuro Ox 2, nonfocal LUA AVF+bruit    Home meds:  - aspirin 325 qd/ calcium acetate 3 tid ac  - metoprolol tartrate 50 bid  - prns    CXR 11/11 - poor film  CXR 11/13 - bilat edema  CXR 11/14 - improved edema, looks resolved to me  Dialysis: [facility] MWF  4h  70kg  LUA AVF  2/2.5 bath  Hep 2500    - hect 2 ug tiw  - mircera 100 ug due 11/11  - venofer 50 /wk q wed  Impression/ Plan: 1. SOB/hypoxemia - suspected pulm edema / vol overload. Resolved w HD, last CXR clear. ECHO showed severe RV dysfunction and question AoV disease (poor study for that). LVEF 60%.    2. AMS / confusion - persistent, MRI  showed multiple old CVA's and new CVA.  Lives in a facility. Per primary team.   3. ESRD - MWF. HD Monday.  4. HTN - BP's soft, holding metoprolol. 5. MBD ckd - cont meds 6. Anemia ckd - esa due this week, gave darbe 60 ug 11/13 7. Parox afib 8. OSA 9. Hx DM2 - not on any medication now   Kelly Splinter MD [place] pager [phone]   11/12/2018, 12:57 PM   Recent Labs  Lab 11/07/18 0848  11/09/18 0230 11/11/18 0814  NA 137   < > 137 134*  K 4.4   < > 4.1 4.1  CL 100   < > 100 96*  CO2 24   < > 27 24  GLUCOSE 116*   < > 94 95  BUN 54*   < > 39* 58*  CREATININE 7.01*   < > 5.34* 6.55*  CALCIUM 8.9   < > 9.1 9.3  PHOS 4.9*  --   --  2.7  ALBUMIN 2.9*   < > 2.8* 2.9*   < > = values in this interval not displayed.   Recent Labs  Lab 11/07/18 2046 11/09/18 0230  AST 26 20  ALT  27 26  ALKPHOS 198* 197*  BILITOT 1.1 0.8  PROT 7.0 6.9   Recent Labs  Lab 11/08/18 0016 11/09/18 0230 11/11/18 0814  WBC 8.0 7.7 8.0  NEUTROABS 5.6 5.0  --   HGB 8.5* 8.7* 8.8*  HCT 27.2* 28.0*  27.1* 29.1*  MCV 103.8* 103.7* 103.6*  PLT 147* 138* 151

## 2018-11-13 ENCOUNTER — Inpatient Hospital Stay (HOSPITAL_COMMUNITY): Payer: Medicare Other

## 2018-11-13 DIAGNOSIS — Z992 Dependence on renal dialysis: Secondary | ICD-10-CM

## 2018-11-13 DIAGNOSIS — I6330 Cerebral infarction due to thrombosis of unspecified cerebral artery: Secondary | ICD-10-CM

## 2018-11-13 DIAGNOSIS — I10 Essential (primary) hypertension: Secondary | ICD-10-CM

## 2018-11-13 DIAGNOSIS — E1122 Type 2 diabetes mellitus with diabetic chronic kidney disease: Secondary | ICD-10-CM

## 2018-11-13 DIAGNOSIS — G4733 Obstructive sleep apnea (adult) (pediatric): Secondary | ICD-10-CM

## 2018-11-13 DIAGNOSIS — Z8719 Personal history of other diseases of the digestive system: Secondary | ICD-10-CM

## 2018-11-13 DIAGNOSIS — I639 Cerebral infarction, unspecified: Secondary | ICD-10-CM

## 2018-11-13 LAB — GLUCOSE, CAPILLARY
Glucose-Capillary: 93 mg/dL (ref 70–99)
Glucose-Capillary: 108 mg/dL — ABNORMAL HIGH (ref 70–99)
Glucose-Capillary: 100 mg/dL — ABNORMAL HIGH (ref 70–99)
Glucose-Capillary: 131 mg/dL — ABNORMAL HIGH (ref 70–99)

## 2018-11-13 MED ORDER — CAMPHOR-MENTHOL 0.5-0.5 % EX LOTN
TOPICAL_LOTION | CUTANEOUS | Status: DC | PRN
  Filled 2018-11-13: qty 222

## 2018-11-13 MED ORDER — WARFARIN SODIUM 5 MG PO TABS
5.0000 mg | ORAL_TABLET | Freq: Once | ORAL | Status: AC
  Administered 2018-11-13: 5 mg via ORAL
  Filled 2018-11-13: qty 1

## 2018-11-13 MED ORDER — DOXERCALCIFEROL 4 MCG/2ML IV SOLN
2.0000 ug | INTRAVENOUS | Status: DC
  Administered 2018-11-14 – 2018-11-16 (×2): 2 ug via INTRAVENOUS
  Filled 2018-11-13 (×2): qty 2

## 2018-11-13 MED ORDER — CHLORHEXIDINE GLUCONATE CLOTH 2 % EX PADS
6.0000 | MEDICATED_PAD | Freq: Every day | CUTANEOUS | Status: DC
  Administered 2018-11-15 – 2018-11-17 (×3): 6 via TOPICAL

## 2018-11-13 MED ORDER — WARFARIN - PHARMACIST DOSING INPATIENT: Freq: Every day | Status: DC

## 2018-11-13 NOTE — Progress Notes (Signed)
VASCULAR LAB PRELIMINARY  PRELIMINARY  PRELIMINARY  PRELIMINARY  Carotid duplex completed.    Preliminary report:  1-39% ICA stenosis, bilaterally.  Vertebral artery flow is antegrade.   Bethany Cumming, RVT 11/13/2018, 10:23 AM

## 2018-11-13 NOTE — Progress Notes (Signed)
ANTICOAGULATION CONSULT NOTE - Initial Consult  Pharmacy Consult for warfarin Indication: atrial fibrillation / new CVA  Allergies  Allergen Reactions  . Tape Itching and Other (See Comments)    Cloth tape only    Patient Measurements: Height: 5\' 6"  (167.6 cm) Weight: 152 lb 8.9 oz (69.2 kg) IBW/kg (Calculated) : 63.8   Vital Signs: Temp: 97.6 F (36.4 C) (11/17 1151) Temp Source: Oral (11/17 1151) BP: 132/74 (11/17 1151) Pulse Rate: 91 (11/17 1151)  Labs: Recent Labs    11/11/18 0814  HGB 8.8*  HCT 29.1*  PLT 151  CREATININE 6.55*    Estimated Creatinine Clearance: 8.4 mL/min (A) (by C-G formula based on SCr of 6.55 mg/dL (H)).   Medical History: Past Medical History:  Diagnosis Date  . A-fib ([place])   . Anemia   . Blood transfusion   . BPH (benign prostatic hyperplasia)   . CHF (congestive heart failure) ([place])   . Diarrhea   . DM (diabetes mellitus) ([place])   . ESRD on hemodialysis ([place])    Started dialysis in 2009  . History of GI bleed    secondary to coumadin  . HTN (hypertension)   . Hyperlipidemia   . OSA (obstructive sleep apnea)    uses CPAP  . Secondary hyperparathyroidism of renal origin [facility])      Assessment: 77yom with multiple commorbities including ESRD,admitted for hypoxia, volume overload and MS change.   He has been in Afib HR in 100s but soft BP and not on rate control medications.  He has Hx of GIB and not on anticoagulation PTA.  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  Plan to begin warfarin - when INR 2 stop ASA, heparin sq.    Goal of Therapy:  INR goal 2-2.5 Monitor platelets by anticoagulation protocol: Yes   Plan:  Warfarin 5mg  x1 Daily INR Monitor s/s bleeding  Bonnita Nasuti Pharm.D. CPP, BCPS Clinical Pharmacist [phone] 11/13/2018 3:20 PM

## 2018-11-13 NOTE — Progress Notes (Signed)
[facility] Progress Note  Subjective: no new c/o  Vitals:   11/13/18 0015 11/13/18 0757 11/13/18 0800 11/13/18 1151  BP:  140/70  132/74  Pulse: 91 90 90 91  Resp: (!) 29 13 16 18   Temp:  98.1 F (36.7 C)  97.6 F (36.4 C)  TempSrc:  Oral  Oral  SpO2: 99% 95% 99% 97%  Weight:      Height:        Inpatient medications: . aspirin EC  325 mg Oral Daily  . calcium acetate  2,668 mg Oral TID WC  . darbepoetin (ARANESP) injection - DIALYSIS  60 mcg Intravenous Q Wed-HD  . heparin  5,000 Units Subcutaneous Q8H   . ferric gluconate (FERRLECIT/NULECIT) IV 62.5 mg (11/09/18 1058)   acetaminophen, hydrOXYzine, ipratropium-albuterol  Iron/TIBC/Ferritin/ %Sat    Component Value Date/Time   IRON 48 06/05/2011 0844   TIBC 194 (L) 06/05/2011 0844   FERRITIN 872 (H) 06/05/2011 0844   IRONPCTSAT 25 06/05/2011 0844    Exam: Gen alert, pleasant, confused No jvd or bruits Chest clear bilat Cor irreg irreg w harsh SEM 2-3/ 6, no diast M , no RG Abd soft ntnd no mass or ascites +bs Ext no sig LE edema Neuro Ox 2, nonfocal LUA AVF+bruit    Home meds:  - aspirin 325 qd/ calcium acetate 3 tid ac  - metoprolol tartrate 50 bid  - prns    CXR 11/11 - poor film  CXR 11/13 - bilat edema  CXR 11/14 - improved edema, looks resolved to me  Dialysis: [facility] MWF  4h  70kg  LUA AVF  2/2.5 bath  Hep 2500    - hect 2 ug tiw  - mircera 100 ug due 11/11  - venofer 50 /wk q wed  Impression/ Plan: 1. SOB/hypoxemia - suspected pulm edema / vol overload. Resolved w HD, last CXR clear. ECHO showed severe RV dysfunction and question AoV disease (poor study for that). LVEF 60%.  On room air at 92- 97%.   2. Hx COPD - on home O2   3. 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  Lives in a facility. Hx of dementia/ memory issues per primary discussions w/ family. Per primary team and also per neuro.   4. ESRD - MWF. HD Monday.  5. HTN -  BP's good, holding home metoprolol. 6. MBD ckd - cont phoslo/ hect 2u g 7. Anemia ckd - esa due this week, gave darbe 60 ug here 11/13 8. Parox afib 9. OSA 10. Hx DM2 - not on any medication now   Kelly Splinter MD [place] pager [phone]   11/13/2018, 12:49 PM   Recent Labs  Lab 11/07/18 0848  11/09/18 0230 11/11/18 0814  NA 137   < > 137 134*  K 4.4   < > 4.1 4.1  CL 100   < > 100 96*  CO2 24   < > 27 24  GLUCOSE 116*   < > 94 95  BUN 54*   < > 39* 58*  CREATININE 7.01*   < > 5.34* 6.55*  CALCIUM 8.9   < > 9.1 9.3  PHOS 4.9*  --   --  2.7  ALBUMIN 2.9*   < > 2.8* 2.9*   < > = values in this interval not displayed.   Recent Labs  Lab 11/07/18 2046 11/09/18 0230  AST 26 20  ALT 27 26  ALKPHOS 198* 197*  BILITOT 1.1 0.8  PROT 7.0 6.9   Recent Labs  Lab 11/08/18 0016 11/09/18 0230 11/11/18 0814  WBC 8.0 7.7 8.0  NEUTROABS 5.6 5.0  --   HGB 8.5* 8.7* 8.8*  HCT 27.2* 28.0*  27.1* 29.1*  MCV 103.8* 103.7* 103.6*  PLT 147* 138* 151

## 2018-11-13 NOTE — Progress Notes (Signed)
RT went into pts room to do 0400 check, pt was not on bipap. RT will continue to monitor as needed.

## 2018-11-13 NOTE — Progress Notes (Signed)
Patient ID: [name], male   DOB: 1941-04-01, 77 y.o.   MRN: [id number]  PROGRESS NOTE    [name]  ERD:[id number] DOB: [birth year] DOA: 11/06/2018 PCP: No primary care provider on file.   Brief Narrative:  77 year old male with PMH of PAF, chronic diastolic CHF, ESRD on MWF HD, DM 2, HTN, HLD, OSA,?  On home oxygen, hospitalized 10/20/2018-11/01/2018 for acute respiratory failure with hypoxia due to volume overload versus pneumonia, sepsis, acute metabolic encephalopathy, completed a course of antibiotics, volume management across dialysis, hypoxia resolved, now presented from nursing home with dyspnea and near syncope.  Admitted again for acute hypoxic respiratory failure secondary to volume overload acute encephalopathy.  Nephrology consulted for dialysis needs.  Hypoxic respiratory failure has resolved with hemodialysis.  Patient is off supplemental oxygen.  Collateral information from patient's daughter revealed that patient has had memory problems for about 1 to 2 years, without formal diagnosis of dementia.  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  Patient was on aspirin prior to admission.  Apparently, patient was on Coumadin but this was discontinued in 2012 due to likely lower GI bleed, probably diverticular bleed.  Colonoscopy at that time was said not to be nonrevealing.  Neurology was consulted for the acute infarct.  Coumadin has been recommended, with INR goal of 2-2.5, and with aspirin bridge (aspirin to be discontinued when Coumadin gets to goal).  Recent echocardiogram result is noted.  Carotid Doppler ultrasound is said to be normal revealing.  Patient has what appears to be dry gangrenous left little toe, with dark discoloration of left lower leg.  Arterial ultrasound of the lower extremity result is pending.  Despite above, patient has continued to improve.  We will pursue disposition depending on physical therapy recommendation.  Patient was  discharged to skilled nursing facility for rehab after last hospitalization.  Further management will depend on hospital course.   Assessment & Plan:   Principal Problem:   Acute hypoxemic respiratory failure (HCC) Active Problems:   DM (diabetes mellitus) ([place])   OBSTRUCTIVE SLEEP APNEA   HYPERTENSION, BENIGN   PAF (paroxysmal atrial fibrillation) (HCC)   ESRD (end stage renal disease) (HCC)   Acute encephalopathy   Chronic anemia   Volume overload   Multifocal pneumonia   Acute metabolic encephalopathy   Physical deconditioning   Goals of care, counseling/discussion   Palliative care by specialist   DNR (do not resuscitate) discussion   Cerebral thrombosis with cerebral infarction   Acute CVA: - MRI of the brain revealed 5 mm right posterior coronary radiata infarct, as well as previous multiple infarcts and deep gray matter changes likely secondary to uncontrolled hypertension..   - Carotid Doppler ultrasound is nonrevealing.   Recent echocardiogram result is noted.   Patient was on aspirin prior to admission.   Neurology input is appreciated.   Patient will start Coumadin with aspirin bridge, and INR goal of 2-2.5.   As per neurology team, aspirin may be discontinued once Coumadin is at goal.   Monitor closely, considering prior history of lower GI bleed.   Lipid profile seems to be at goal.   A1c is within normal range.   Pursue disposition depending on physical therapy recommendation.  Acute hypoxic hypercapnic respiratory failure -Likely from ESRD/pulmonary edema/volume overload and OSA.   -Pulmonary edema has resolved with hemodialysis.   -Suspect patient has been not compliant.   -Patient is off supplemental oxygen.    Acute on chronic  diastolic CHF -TTE 74/07/1447: LVEF 60-65%.  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  PA peak pressure 67 mmHg. -Volume management across HD.  Consider outpatient cardiology consultation and  follow-up. - Continue hemodialysis. - Low threshold to consult the cardiology service for severe right heart failure with pulmonary hypertension. -Guarded prognosis. -Palliative care input is highly appreciated.  Acute metabolic encephalopathy: -Likely multifactorial.   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 -Acute encephalopathy seems to have improved significantly. -Hypoxia/pulmonary edema has improved significantly with hemodialysis. -Intermittent episodes of hypoglycemia were also noted. -Continue to monitor blood sugar closely, as well as caloric intake. -Acute CVA management as per neurology team.  End-stage renal disease on hemodialysis -Dialysis as per nephrology.   Nephrology input is highly appreciated.  Paroxysmal atrial fibrillation -Patient will start Coumadin.   -Continue rate control.   -Arrange follow-up with cardiology on discharge.   -Monitor closely, considering prior history of lower GI bleed.    Diabetes mellitus type II - Recent A1c 4.2 -DC SSI and monitor CBGs. -Intermittent episodes of hypoglycemia indented.  Essential hypertension -Blood pressure on the lower side.   -Beta-blocker held. -Avoid excessive drop in blood pressure, considering acute CVA.  OSA - continue CPAP  Anemia of chronic disease/macrocytic anemia -Stable.  Monitor.  TSH and B12 normal.  Physical deconditioning -PT eval  Lytic lesions: Detailed evaluation done during recent hospitalization and as per documentation and DC summary 11/01/2018.  Plan as outlined in that DC summary.  SPEP 10/30/2018: Unremarkable.  Evidence of monoclonal protein is not apparent.  Possible undiagnosed peripheral artery disease: Result of arterial Doppler ultrasound of the extremities still pending.  DVT prophylaxis: Subcutaneous  heparin Code Status: Full code  family Communication: None at bedside Disposition Plan: To SNF pending clinical improvement  Consultants:  Nephrology PMT-pending Neurology  Procedures:  HD  Antimicrobials:  Cefepime and vancomycin from 11/06/2018-11/13   Subjective: No new complaints. Good appetite No shortness of breath No chest pain Poor memory persists No fever chills.  Objective: Vitals:   11/13/18 0015 11/13/18 0757 11/13/18 0800 11/13/18 1151  BP:  140/70  132/74  Pulse: 91 90 90 91  Resp: (!) 29 13 16 18   Temp:  98.1 F (36.7 C)  97.6 F (36.4 C)  TempSrc:  Oral  Oral  SpO2: 99% 95% 99% 97%  Weight:      Height:        Intake/Output Summary (Last 24 hours) at 11/13/2018 1412 Last data filed at 11/13/2018 1254 Gross per 24 hour  Intake 1080 ml  Output 0 ml  Net 1080 ml   Filed Weights   11/09/18 1203 11/11/18 0746 11/11/18 1210  Weight: 68.6 kg 70.7 kg 69.2 kg    Examination:  General exam: Pleasant elderly male.  Not in any distress. Respiratory system: Bibasilar crackles posteriorly.  No increased work of breathing. Cardiovascular system: S1-S2 heard, irregularly irregular.   Gastrointestinal system: Abdomen is obese, soft and nontender. Normal bowel sounds heard.  Stable. Extremities: No cyanosis; trace edema.  Moves all limbs symmetrically.  Left upper extremity AV fistula with ongoing dialysis. CNS: Disoriented to time place.  No focal neurological deficits.  Power is full in all extremities   Data Reviewed: I have personally reviewed following labs and imaging studies  CBC: Recent Labs  Lab 11/07/18 0847 11/07/18 2046 11/08/18 0016 11/09/18 0230 11/11/18  0814  WBC 8.4 9.3 8.0 7.7 8.0  NEUTROABS  --   --  5.6 5.0  --   HGB 8.5* 8.9* 8.5* 8.7* 8.8*  HCT 27.4* 29.8* 27.2* 28.0*  27.1* 29.1*  MCV 104.2* 104.9* 103.8* 103.7* 103.6*  PLT 164 160 147* 138* 960   Basic Metabolic Panel: Recent Labs  Lab 11/07/18 0848 11/07/18 2046  11/08/18 0016 11/09/18 0230 11/11/18 0814  NA 137 136 138 137 134*  K 4.4 3.8 3.8 4.1 4.1  CL 100 99 100 100 96*  CO2 24 28 26 27 24   GLUCOSE 116* 116* 105* 94 95  BUN 54* 13 15 39* 58*  CREATININE 7.01* 2.89* 3.19* 5.34* 6.55*  CALCIUM 8.9 8.7* 8.8* 9.1 9.3  MG  --   --  2.0 2.2  --   PHOS 4.9*  --   --   --  2.7   GFR: Estimated Creatinine Clearance: 8.4 mL/min (A) (by C-G formula based on SCr of 6.55 mg/dL (H)). Liver Function Tests: Recent Labs  Lab 11/07/18 0848 11/07/18 2046 11/09/18 0230 11/11/18 0814  AST  --  26 20  --   ALT  --  27 26  --   ALKPHOS  --  198* 197*  --   BILITOT  --  1.1 0.8  --   PROT  --  7.0 6.9  --   ALBUMIN 2.9* 3.0* 2.8* 2.9*    Recent Labs  Lab 11/06/18 1850 11/09/18 0230  AMMONIA 40* 42*   Cardiac Enzymes: Recent Labs  Lab 11/06/18 1855  TROPONINI <0.03   CBG: Recent Labs  Lab 11/12/18 1233 11/12/18 1648 11/12/18 2127 11/13/18 0755 11/13/18 1206  GLUCAP 128* 133* 118* 93 108*   Thyroid Function Tests: No results for input(s): TSH, T4TOTAL, FREET4, T3FREE, THYROIDAB in the last 72 hours. Anemia Panel: Recent Labs    11/11/18 1920  VITAMINB12 577     Recent Results (from the past 240 hour(s))  Blood culture (routine x 2)     Status: None   Collection Time: 11/06/18  8:30 PM  Result Value Ref Range Status   Specimen Description BLOOD RIGHT ARM  Final   Special Requests   Final    BOTTLES DRAWN AEROBIC ONLY Blood Culture adequate volume   Culture   Final    NO GROWTH 5 DAYS Performed at [facility], [address], [place], [place] [address]    Report Status 11/11/2018 FINAL  Final  Blood culture (routine x 2)     Status: None   Collection Time: 11/06/18  8:50 PM  Result Value Ref Range Status   Specimen Description BLOOD RIGHT HAND  Final   Special Requests   Final    BOTTLES DRAWN AEROBIC AND ANAEROBIC Blood Culture adequate volume   Culture   Final    NO GROWTH 5 DAYS Performed at [facility], [place] [address], [place], [place] [address]    Report Status 11/11/2018 FINAL  Final         Radiology Studies: Mr Virgel Paling YN Contrast  Result Date: 11/13/2018 CLINICAL DATA:  Stroke follow-up. EXAM: MRA HEAD WITHOUT CONTRAST TECHNIQUE: Angiographic images of the Circle of Willis were obtained using MRA technique without intravenous contrast. COMPARISON:  Head MRI 11/11/2018. No prior cerebral angiographic imaging. FINDINGS: The study is severely motion degraded despite repeated imaging attempts. The visualized distal vertebral arteries are patent to the basilar and codominant. The basilar artery is patent without evidence  of flow limiting stenosis. A moderately large left posterior communicating artery is identified. The distal left P1 segment is poorly visualized which may reflect artifact, hypoplasia, or stenosis. The right P1 and P2 segments are patent without evidence of flow limiting stenosis. The intracranial internal carotid arteries are patent with extensive motion artifact limiting assessment of the cavernous and supraclinoid segments. Poor visualization of the right M1 segment is felt to be artifactual given a normal flow void on the recent MRI and presence of flow in proximal M2 vessels. The left M1 segment is patent with nondiagnostic assessment for stenosis due to artifact. The A1 and A2 segments are grossly patent bilaterally. No large aneurysm is identified. IMPRESSION: Severely motion degraded study as above. No definite large vessel occlusion. Electronically Signed   By: Logan Bores M.D.   On: 11/13/2018 11:17   Mr Brain Wo Contrast  Result Date: 11/11/2018 CLINICAL DATA:  Initial evaluation for acute encephalopathy. EXAM: MRI HEAD WITHOUT CONTRAST TECHNIQUE: Multiplanar, multiecho pulse sequences of the brain and surrounding structures were obtained without intravenous contrast. COMPARISON:  Prior CT from 10/20/2018. FINDINGS: Brain: Examination moderately degraded by  motion artifact. Diffuse prominence of the CSF containing spaces compatible with generalized cerebral atrophy. Patchy and confluent T2/FLAIR hyperintensity within the periventricular and deep white matter both cerebral hemispheres, consistent with chronic small vessel ischemic disease, moderate nature. Multiple superimposed remote lacunar infarcts within the bilateral basal ganglia and thalami. Associated chronic hemosiderin staining about several of these infarcts. Small remote cortical infarct at the left occipital lobe. 5 mm focus of diffusion abnormality at the posterior right corona radiata consistent with an acute ischemic small vessel type infarct (series 3, image 31). No associated hemorrhage or mass effect. No other evidence for acute or subacute ischemia. Gray-white matter differentiation otherwise maintained. No acute intracranial hemorrhage. Several scattered chronic micro hemorrhages seen clustered about the deep gray nuclei and brainstem, most like related to chronic underlying hypertension. No mass lesion, midline shift or mass effect. Diffuse ventricular prominence related global parenchymal volume loss of hydrocephalus. No extra-axial fluid collection. Pituitary gland grossly within normal limits. Vascular: Major intracranial vascular flow voids maintained. Skull and upper cervical spine: Craniocervical junction within normal limits. No focal marrow replacing lesion. Scalp soft tissues unremarkable. Sinuses/Orbits: Patient status post bilateral ocular lens replacement. Paranasal sinuses are largely clear. Bilateral mastoid effusions noted, right larger than left. Other: None. IMPRESSION: 1. 5 mm acute ischemic nonhemorrhagic small vessel type infarct involving the posterior right corona radiata. 2. Underlying moderate cerebral atrophy with chronic small vessel ischemic disease with multiple remote lacunar infarcts about the bilateral basal ganglia and thalami. 3. Additional remote left occipital  cortical infarct. 4. Scattered chronic micro hemorrhages centered about the deep gray nuclei, likely due to chronic uncontrolled hypertension. Electronically Signed   By: Jeannine Boga M.D.   On: 11/11/2018 20:52   Vas [place] Burnard Bunting With/wo Tbi  Result Date: 11/13/2018 LOWER EXTREMITY DOPPLER STUDY Indications: Peripheral artery disease.  Limitations: patient movement Performing Technologist: Abram Sander RVS  Examination Guidelines: A complete evaluation includes at minimum, Doppler waveform signals and systolic blood pressure reading at the level of bilateral brachial, anterior tibial, and posterior tibial arteries, when vessel segments are accessible. Bilateral testing is considered an integral part of a complete examination. Photoelectric Plethysmograph (PPG) waveforms and toe systolic pressure readings are included as required and additional duplex testing as needed. Limited examinations for reoccurring indications may be performed as noted.  ABI Findings: +--------+------------------+-----+----------+--------+ Right   Rt Pressure (  mmHg)IndexWaveform  Comment  +--------+------------------+-----+----------+--------+ [id number]                    triphasic          +--------+------------------+-----+----------+--------+ PTA     69                0.63 monophasic         +--------+------------------+-----+----------+--------+ DP      62                0.57 monophasic         +--------+------------------+-----+----------+--------+ +--------+------------------+-----+----------+-------------------------------+ Left    Lt Pressure (mmHg)IndexWaveform  Comment                         +--------+------------------+-----+----------+-------------------------------+ 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 +--------+------------------+-----+----------+-------------------------------+ PTA     63                0.58 monophasic                                 +--------+------------------+-----+----------+-------------------------------+ DP                                       not audible                     +--------+------------------+-----+----------+-------------------------------+ +-------+-----------+-----------+------------+------------+ ABI/TBIToday's ABIToday's TBIPrevious ABIPrevious TBI +-------+-----------+-----------+------------+------------+ Right  0.63                                           +-------+-----------+-----------+------------+------------+ Left   0.58                                           +-------+-----------+-----------+------------+------------+  Summary: Right: Resting right ankle-brachial index indicates moderate right lower extremity arterial disease. Left: Resting left ankle-brachial index indicates moderate left lower extremity arterial disease.  *See table(s) above for measurements and observations.  Electronically signed by Ruta Hinds MD on 11/13/2018 at 9:21:47 AM.   Final         Scheduled Meds: . aspirin EC  325 mg Oral Daily  . calcium acetate  2,668 mg Oral TID WC  . [START ON 11/14/2018] Chlorhexidine Gluconate Cloth  6 each Topical Q0600  . darbepoetin (ARANESP) injection - DIALYSIS  60 mcg Intravenous Q Wed-HD  . [START ON 11/14/2018] doxercalciferol  2 mcg Intravenous Q M,W,F-HD  . heparin  5,000 Units Subcutaneous Q8H   Continuous Infusions: . ferric gluconate (FERRLECIT/NULECIT) IV 62.5 mg (11/09/18 1058)     LOS: 7 days     Bonnell Public, MD. [facility] Pager [phone]  If 7PM-7AM, please contact night-coverage [email or web address] Password [email or web address] 11/13/2018, 2:12 PM

## 2018-11-13 NOTE — Plan of Care (Signed)

## 2018-11-13 NOTE — Progress Notes (Addendum)
STROKE TEAM PROGRESS NOTE   SUBJECTIVE (INTERVAL HISTORY) His daughter and son in law are at the bedside.  Pt sitting in chair comfortably. No acute issue overnight. No focal neuro deficit.    OBJECTIVE Vitals:   11/12/18 1944 11/12/18 2240 11/13/18 0015 11/13/18 0757  BP: 116/64 (!) 123/57  140/70  Pulse:   91 90  Resp:   (!) 29 13  Temp: 97.7 F (36.5 C) 98.1 F (36.7 C)  98.1 F (36.7 C)  TempSrc: Oral Oral  Oral  SpO2:   99% 95%  Weight:      Height:        CBC:  Recent Labs  Lab 11/08/18 0016 11/09/18 0230 11/11/18 0814  WBC 8.0 7.7 8.0  NEUTROABS 5.6 5.0  --   HGB 8.5* 8.7* 8.8*  HCT 27.2* 28.0*  27.1* 29.1*  MCV 103.8* 103.7* 103.6*  PLT 147* 138* 222    Basic Metabolic Panel:  Recent Labs  Lab 11/07/18 0848  11/08/18 0016 11/09/18 0230 11/11/18 0814  NA 137   < > 138 137 134*  K 4.4   < > 3.8 4.1 4.1  CL 100   < > 100 100 96*  CO2 24   < > 26 27 24   GLUCOSE 116*   < > 105* 94 95  BUN 54*   < > 15 39* 58*  CREATININE 7.01*   < > 3.19* 5.34* 6.55*  CALCIUM 8.9   < > 8.8* 9.1 9.3  MG  --   --  2.0 2.2  --   PHOS 4.9*  --   --   --  2.7   < > = values in this interval not displayed.    Lipid Panel:     Component Value Date/Time   CHOL 113 11/11/2018 1517   TRIG 60 11/11/2018 1517   HDL 39 (L) 11/11/2018 1517   CHOLHDL 2.9 11/11/2018 1517   VLDL 12 11/11/2018 1517   LDLCALC 62 11/11/2018 1517   HgbA1c:  Lab Results  Component Value Date   HGBA1C 4.2 (L) 11/11/2018   Urine Drug Screen: No results found for: LABOPIA, COCAINSCRNUR, LABBENZ, AMPHETMU, THCU, LABBARB  Alcohol Level No results found for: [facility]  IMAGING   Mr Brain Wo Contrast 11/11/2018 IMPRESSION:  1. 5 mm acute ischemic nonhemorrhagic small vessel type infarct involving the posterior right corona radiata.  2. Underlying moderate cerebral atrophy with chronic small vessel ischemic disease with multiple remote lacunar infarcts about the bilateral basal ganglia and thalami.   3. Additional remote left occipital cortical infarct.  4. Scattered chronic micro hemorrhages centered about the deep gray nuclei, likely due to chronic uncontrolled hypertension.     Transthoracic Echocardiogram 11/08/2018 Study Conclusions - Left ventricle: The cavity size was mildly reduced. There was   mild concentric hypertrophy. Systolic function was normal. The   estimated ejection fraction was in the range of 60% to 65%. Wall   motion was normal; there were no regional wall motion   abnormalities. - Aortic valve: Cusp separation was severely reduced. Immobile left   coronary cusp. Right coronary and noncoronary cusp mobility was   moderately restricted. Transvalvular velocity was increased less   than expected. There was moderate stenosis. By planimetry the   valve area is 0.9 cm sq. Valve area (VTI): 0.78 cm^2. Valve area   (Vmean): 0.92 cm^2. - Mitral valve: Calcified annulus. Mildly thickened leaflets . - Left atrium: The atrium was moderately dilated. - Right ventricle: The cavity  size was severely dilated. Systolic   function was moderately to severely reduced. - Right atrium: The atrium was moderately dilated. - Tricuspid valve: There was mild-moderate regurgitation directed   centrally. - Pulmonary arteries: PA peak pressure: 67 mm Hg (S). Impressions: - Suspect aortic valve gradients are underestimated. The patient   did not fully cooperate with the exam. In addition gradients may   be lower than expected due to limited cardiac output secondary to   right ventricular failure.   Bilateral Carotid Dopplers Bilateral: 1-39% ICA stenosis. Vertebral artery flow is antegrade.  Mr Jodene Nam Head Wo Contrast  Result Date: 11/13/2018 CLINICAL DATA:  Stroke follow-up. EXAM: MRA HEAD WITHOUT CONTRAST TECHNIQUE: Angiographic images of the Circle of Willis were obtained using MRA technique without intravenous contrast. COMPARISON:  Head MRI 11/11/2018. No prior cerebral  angiographic imaging. FINDINGS: The study is severely motion degraded despite repeated imaging attempts. The visualized distal vertebral arteries are patent to the basilar and codominant. The basilar artery is patent without evidence of flow limiting stenosis. A moderately large left posterior communicating artery is identified. The distal left P1 segment is poorly visualized which may reflect artifact, hypoplasia, or stenosis. The right P1 and P2 segments are patent without evidence of flow limiting stenosis. The intracranial internal carotid arteries are patent with extensive motion artifact limiting assessment of the cavernous and supraclinoid segments. Poor visualization of the right M1 segment is felt to be artifactual given a normal flow void on the recent MRI and presence of flow in proximal M2 vessels. The left M1 segment is patent with nondiagnostic assessment for stenosis due to artifact. The A1 and A2 segments are grossly patent bilaterally. No large aneurysm is identified. IMPRESSION: Severely motion degraded study as above. No definite large vessel occlusion. Electronically Signed   By: Logan Bores M.D.   On: 11/13/2018 11:17     PHYSICAL EXAM  Temp:  [97.6 F (36.4 C)-98.3 F (36.8 C)] 97.6 F (36.4 C) (11/17 1151) Pulse Rate:  [90-91] 91 (11/17 1151) Resp:  [13-29] 18 (11/17 1151) BP: (105-140)/(57-74) 132/74 (11/17 1151) SpO2:  [95 %-100 %] 97 % (11/17 1151)  General - Well nourished, well developed, in no apparent distress.  Ophthalmologic - fundi not visualized due to noncooperation.  Cardiovascular - Regular rate and rhythm.  Mental Status -  Level of arousal and orientation to place, and person were intact, but not oriented to time. Language including expression, naming, repetition, comprehension was assessed and found intact.  Cranial Nerves II - XII - II - Visual field intact OU. III, IV, VI - Extraocular movements intact. V - Facial sensation intact  bilaterally. VII - Facial movement intact bilaterally. VIII - Hearing & vestibular intact bilaterally. X - Palate elevates symmetrically. Mild dysarthria due to poor denture XI - Chin turning & shoulder shrug intact bilaterally. XII - Tongue protrusion intact.  Motor Strength - The patient's strength was normal in all extremities and pronator drift was absent.  Bulk was normal and fasciculations were absent.   Motor Tone - Muscle tone was assessed at the neck and appendages and was normal.  Reflexes - The patient's reflexes were symmetrical in all extremities and he had no pathological reflexes.  Sensory - Light touch, temperature/pinprick were assessed and were symmetrical.    Coordination - The patient had normal movements in the hands and feet with no ataxia or dysmetria.  Tremor was absent.  Gait and Station - deferred.   ASSESSMENT/PLAN Mr. NAI DASCH is a  77 y.o. male with history of afib off coumadin due to LGIB in 06/2011, CHF, DM, ESRD on HD, HTN, HLD, OSA on CPAP, smoker presenting with SOB, near syncope and confusion. He did not receive IV t-PA due to outside window.   Stroke:  Right CR small infarct, likely due to afib not on AC  MRI head - right CR small infarct, old b/l BG/thalami infarcts and left occipital infarcts  MRA head - motion degraded but no LVO  Carotid Doppler - unremarkable   2D Echo - EF 60-65%  LDL - 62  HgbA1c - 4.2  VTE prophylaxis - heparin subq  aspirin 325 mg daily prior to admission, now on aspirin 325 mg daily. Recommend coumadin with INR 2-2.5. OK with ASA 325 bridge. Once INR at goal, ASA can be discontinued  Patient counseled to be compliant with his antithrombotic medications  Ongoing aggressive stroke risk factor management  Therapy recommendations:  pending  Disposition:  Pending  PAF not on AC  Off coumadin in 05/2011 due to LGIB  On ASA PTA  Follows with Cardiology  Rate controlled  Given current stroke,  recommend coumadin with INR 2-2.5. OK with ASA 325 bridge. Once INR at goal, ASA can be discontinued  Hx of LGIB  rectal bleeding in 05/2011 and required 4 units PRBCs and 1 unit FFP.    Colonoscopy did not show definite source, perhaps it was diverticular bleeding.    No recurrence of the rectal bleeding recently.   Given current stroke and no recurrent GIB, recommend coumadin with INR 2-2.5.   Hypertension  Stable . Long-term BP goal normotensive  Diabetes  HgbA1c 4.2, goal < 7.0  Controlled  Tobacco abuse  Current smoker  Smoking cessation counseling provided  Pt is willing to quit  Other Stroke Risk Factors  Advanced age  Hx stroke/TIA - by imaging  CHF  Obstructive sleep apnea, on CPAP at home  Other Active Problems  ESRD on HD  Hospital day # 7  I spent  35 minutes in total face-to-face time with the patient, more than 50% of which was spent in counseling and coordination of care, reviewing test results, images and medication, and discussing the diagnosis of afib not on AC, stroke and hx of GIB, treatment plan and potential prognosis. This patient's care requiresreview of multiple databases, neurological assessment, discussion with family, other specialists and medical decision making of high complexity. I had long discussion with daughter and pt at bedside, updated pt current condition, treatment plan and potential prognosis. They expressed understanding and appreciation. We had long discussion with pros and cons with anticoagulation and eventually made decision on coumadin.  Neurology will sign off. Please call with questions. Pt will follow up with stroke clinic NP at [facility] in about 4 weeks. Thanks for the consult.   Rosalin Hawking, MD PhD Stroke Neurology 11/13/2018 2:19 PM   To contact Stroke Continuity provider, please refer to [email or web address] After hours, contact General Neurology

## 2018-11-14 DIAGNOSIS — Z992 Dependence on renal dialysis: Secondary | ICD-10-CM

## 2018-11-14 DIAGNOSIS — I70262 Atherosclerosis of native arteries of extremities with gangrene, left leg: Secondary | ICD-10-CM

## 2018-11-14 DIAGNOSIS — N186 End stage renal disease: Secondary | ICD-10-CM

## 2018-11-14 DIAGNOSIS — I739 Peripheral vascular disease, unspecified: Secondary | ICD-10-CM

## 2018-11-14 DIAGNOSIS — I96 Gangrene, not elsewhere classified: Secondary | ICD-10-CM

## 2018-11-14 LAB — GLUCOSE, CAPILLARY
GLUCOSE-CAPILLARY: 106 mg/dL — AB (ref 70–99)
GLUCOSE-CAPILLARY: 124 mg/dL — AB (ref 70–99)
Glucose-Capillary: 105 mg/dL — ABNORMAL HIGH (ref 70–99)

## 2018-11-14 LAB — CBC WITH DIFFERENTIAL/PLATELET
Abs Immature Granulocytes: 0.07 10*3/uL (ref 0.00–0.07)
Basophils Absolute: 0.1 10*3/uL (ref 0.0–0.1)
Basophils Relative: 1 %
Eosinophils Absolute: 0.3 10*3/uL (ref 0.0–0.5)
Eosinophils Relative: 3 %
HCT: 28.9 % — ABNORMAL LOW (ref 39.0–52.0)
Hemoglobin: 8.9 g/dL — ABNORMAL LOW (ref 13.0–17.0)
Immature Granulocytes: 1 %
Lymphocytes Relative: 11 %
Lymphs Abs: 1.1 10*3/uL (ref 0.7–4.0)
MCH: 31.3 pg (ref 26.0–34.0)
MCHC: 30.8 g/dL (ref 30.0–36.0)
MCV: 101.8 fL — ABNORMAL HIGH (ref 80.0–100.0)
Monocytes Absolute: 1 10*3/uL (ref 0.1–1.0)
Monocytes Relative: 10 %
Neutro Abs: 7.4 10*3/uL (ref 1.7–7.7)
Neutrophils Relative %: 74 %
Platelets: 155 10*3/uL (ref 150–400)
RBC: 2.84 MIL/uL — ABNORMAL LOW (ref 4.22–5.81)
RDW: 16.5 % — ABNORMAL HIGH (ref 11.5–15.5)
WBC: 9.8 10*3/uL (ref 4.0–10.5)
nRBC: 0 % (ref 0.0–0.2)

## 2018-11-14 LAB — RENAL FUNCTION PANEL
Albumin: 3.1 g/dL — ABNORMAL LOW (ref 3.5–5.0)
Anion gap: 13 (ref 5–15)
BUN: 72 mg/dL — ABNORMAL HIGH (ref 8–23)
CO2: 25 mmol/L (ref 22–32)
Calcium: 9.8 mg/dL (ref 8.9–10.3)
Chloride: 97 mmol/L — ABNORMAL LOW (ref 98–111)
Creatinine, Ser: 7.61 mg/dL — ABNORMAL HIGH (ref 0.61–1.24)
GFR calc Af Amer: 7 mL/min — ABNORMAL LOW (ref >60)
GFR calc non Af Amer: 6 mL/min — ABNORMAL LOW (ref >60)
Glucose, Bld: 139 mg/dL — ABNORMAL HIGH (ref 70–99)
Phosphorus: 2.4 mg/dL — ABNORMAL LOW (ref 2.5–4.6)
Potassium: 5 mmol/L (ref 3.5–5.1)
Sodium: 135 mmol/L (ref 135–145)

## 2018-11-14 LAB — PROTIME-INR
INR: 1.28
Prothrombin Time: 15.9 seconds — ABNORMAL HIGH (ref 11.4–15.2)

## 2018-11-14 LAB — MAGNESIUM: Magnesium: 2.4 mg/dL (ref 1.7–2.4)

## 2018-11-14 MED ORDER — RENA-VITE PO TABS
1.0000 | ORAL_TABLET | Freq: Every day | ORAL | Status: DC
  Administered 2018-11-14 – 2018-11-16 (×3): 1 via ORAL
  Filled 2018-11-14 (×3): qty 1

## 2018-11-14 MED ORDER — WARFARIN - PHARMACIST DOSING INPATIENT: Freq: Every day | Status: DC

## 2018-11-14 MED ORDER — ENSURE ENLIVE PO LIQD
237.0000 mL | Freq: Two times a day (BID) | ORAL | Status: DC
  Administered 2018-11-17: 237 mL via ORAL

## 2018-11-14 MED ORDER — DOXERCALCIFEROL 4 MCG/2ML IV SOLN
INTRAVENOUS | Status: AC
  Filled 2018-11-14: qty 2

## 2018-11-14 MED ORDER — PRO-STAT SUGAR FREE PO LIQD
30.0000 mL | Freq: Every day | ORAL | Status: DC
  Administered 2018-11-16 – 2018-11-17 (×2): 30 mL via ORAL
  Filled 2018-11-14 (×2): qty 30

## 2018-11-14 NOTE — Procedures (Signed)
Patient seen and examined on Hemodialysis. QB 400 mL/ min via AVF UF goal 2L.  No complaints at present.    Treatment adjusted as needed.  Madelon Lips MD [facility] pgr [phone] 3:05 PM

## 2018-11-14 NOTE — Progress Notes (Signed)
Approximated 1600hrs , pt has been observed venous site access oozing.Marland Kitchenapproximated 69ml of blood  Around the left arm area, and splattered across the neck/arm/face area.Denies any discomfort at this time. Denied moving his left arm. Keep on remininding pt to keep left arm cannulated sites still during HD tx. Verbalized understanding. . recirculated HD lines and recannulated pt. Venous access.  Cleaned pt. With no complaints. HD ongoing and continue to  Monitor pt.

## 2018-11-14 NOTE — Progress Notes (Signed)
Palliative: [name] is resting quietly in bed in the HD suite.  He will make but not [name] eye contact.  At this point, there is some difficulty with machine flow.  We talked briefly about his needs, disposition.  He has no questions or concerns at this time, we plan for follow-up tomorrow. Call to daughter, [name].  Left voicemail message.  Awaiting vascular surgery angiogram with bilateral lower extremity runoff and possible left lower extremity intervention.  25 minutes [name], NP Palliative medicine team [phone]

## 2018-11-14 NOTE — Progress Notes (Signed)
Patient ID: [name], male   DOB: 1941-06-16, 77 y.o.   MRN: [id number]  PROGRESS NOTE    ZAINE ELSASS  DGL:[id number] DOB: 04-Mar-1941 DOA: 11/06/2018 PCP: No primary care provider on file.   Brief Narrative:  77 year old male with PMH of PAF, chronic diastolic CHF, ESRD on MWF HD, DM 2, HTN, HLD, OSA,?  On home oxygen, hospitalized 10/20/2018-11/01/2018 for acute respiratory failure with hypoxia due to volume overload versus pneumonia, sepsis, acute metabolic encephalopathy, completed a course of antibiotics, volume management across dialysis, hypoxia resolved, now presented from nursing home with dyspnea and near syncope.  Admitted again for acute hypoxic respiratory failure secondary to volume overload & acute encephalopathy.  Nephrology consulted for dialysis needs.  Hypoxic respiratory failure has resolved with hemodialysis. Collateral information from patient's daughter revealed that patient has had memory problems for about 1 to 2 years, without formal diagnosis of dementia.  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  Patient was on aspirin prior to admission. Apparently, patient was on Coumadin but this was discontinued in 2012 due to likely lower GI bleed, probably diverticular bleed.  Colonoscopy at that time was said not to be nonrevealing.  Neurology was consulted for the acute infarct.  Coumadin has been recommended, with INR goal of 2-2.5, and with aspirin bridge (aspirin to be discontinued when Coumadin gets to goal).  Recent echocardiogram result is noted.  Carotid Doppler ultrasound is said to be normal revealing.  Patient has what appears to be dry gangrenous left little toe, with dark discoloration of left lower leg.  ABI shows moderate PAD.  VVS consulted on 11/18 who plan angiogram with bilateral lower extremity runoff and possible left lower extremity intervention.  DC to SNF pending vascular surgery intervention and sign off.  Assessment & Plan:   Principal Problem:   Acute hypoxemic respiratory failure (HCC) Active Problems:   DM (diabetes mellitus) (HCC)   OBSTRUCTIVE SLEEP APNEA   HYPERTENSION, BENIGN   PAF (paroxysmal atrial fibrillation) (HCC)   ESRD (end stage renal disease) (HCC)   Acute encephalopathy   Chronic anemia   Volume overload   Multifocal pneumonia   Acute metabolic encephalopathy   Physical deconditioning   Goals of care, counseling/discussion   Palliative care by specialist   DNR (do not resuscitate) discussion   Cerebral thrombosis with cerebral infarction   Acute CVA: - Stroke MD was consulted and completed stroke evaluation. - MRI of the brain revealed 5 mm right posterior coronary radiata infarct, as well as previous multiple infarcts and deep gray matter changes.  -MRA head: Motion degraded but no large vessel occlusions. -Carotid Dopplers: Bilateral 1-39% ICA stenosis. -TTE 11/12: LVEF 60-65%. -LDL 62 and A1c 4.2. - Patient was on aspirin prior to admission.   -As per stroke MD sign off, suspect acute stroke likely due to A. fib not on anticoagulation and thereby recommended Coumadin with INR goal of 2-2.5 and to continue aspirin 325 mg daily bridge until INR at goal and then aspirin to be discontinued. - Monitor closely, considering prior history of lower GI bleed.   -DC to SNF pending completion of VVS input as below. -Outpatient follow-up with [facility] in 4 weeks.  Acute hypoxic hypercapnic respiratory failure -Likely from ESRD/pulmonary edema/volume overload and OSA.   -Pulmonary edema has resolved with hemodialysis.   -Resolved.  Saturating normally on room air.  Acute on chronic diastolic CHF -TTE 51/88/4166: LVEF 60-65%.  Aortic valve: Cusp separation was severely reduced  and moderate stenosis.  PA peak pressure 67 mmHg. -Volume management across HD.  Consider outpatient cardiology consultation and follow-up. -Palliative care input is highly appreciated.  Acute metabolic  encephalopathy: -Likely multifactorial.   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 -Acute encephalopathy seems to have improved significantly. -Hypoxia resolved with hemodialysis. -Intermittent episodes of hypoglycemia were also noted which are not seen anymore. -Continue to monitor blood sugar closely, as well as caloric intake. -Acute CVA management as per neurology team. -I discussed in detail with patient's daughter on 11/18.  She spent a couple of hours with patient the day prior and indicates that his mental status changes have resolved and his mental status is back to baseline.  End-stage renal disease on hemodialysis -Ongoing HD as per nephrology guidance.  I discussed with nephrology team who plan to dialyze him again today.  Paroxysmal atrial fibrillation -As per neurology, has been off of Coumadin since 05/2011 due to lower GI bleed.  Was on aspirin prior to admission. -Given current embolic stroke, as per neurology, Coumadin started along with aspirin 325 mg daily bridge.  Aspirin to be stopped when INR therapeutic. -Outpatient follow-up with cardiology. - Monitor closely, considering prior history of lower GI bleed.   -Rate controlled.  Diabetes mellitus type II - Recent A1c 4.2 - DC SSI and monitor CBGs. -Had episodes of hypoglycemia early on in admission, resolved.  Essential hypertension -Blood pressures were on lower side early on in admission and beta-blocker was held.  Blood pressures now normal.  May consider resuming beta-blockers.  OSA - continue CPAP  Anemia of chronic disease/macrocytic anemia -Stable.  Monitor.  TSH and B12 normal.  Physical deconditioning -PT eval and recommend SNF at discharge.  Lytic lesions: Detailed evaluation done during recent hospitalization and as per  documentation and DC summary 11/01/2018.  Plan as outlined in that DC summary.  SPEP 10/30/2018: Unremarkable.  Evidence of monoclonal protein is not apparent.  History of lower GI bleed Rectal bleeding in June 2012 and required 4 units PRBCs and 1 unit FFP.  Colonoscopy did not show definite source but as per neurology note, perhaps it was diverticular bleeding.  No recent rectal bleeding.  Monitor closely while on anticoagulation.  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  ABI shows bilateral moderate PAD.  As per VVS, plan angiogram with bilateral lower extremity runoff and possible left lower extremity intervention and recommend holding Coumadin until intervention.  Adult failure to thrive Multifactorial due to advanced age and multiple severe comorbidities.  Palliative care team input 11/14 appreciated.  They discussed with daughter and CODE STATUS was changed to DNR.   DVT prophylaxis: Subcutaneous heparin, Coumadin per pharmacy Code Status: DNR family Communication: I discussed in detail with patient's daughter on 11/18, updated care and answered questions. Disposition Plan: To SNF pending clinical improvement and vascular surgery intervention.  Consultants:  Nephrology PMT Neurology Vascular surgery  Procedures:  HD  Antimicrobials:  Cefepime and vancomycin from 11/06/2018-11/13   Subjective: Alert and oriented x3.  Denies complaints.  Specifically denies pain in his left little toe or foot.  No chest pain or dyspnea.  As per RN, no acute issues noted.  Objective: Vitals:   11/13/18 2119 11/13/18 2254 11/13/18 2357 11/14/18 0750  BP: 120/61  (!) 144/76   Pulse: 61   90  Resp: (!) 25 18 (!)  25 20  Temp: 97.7 F (36.5 C)  (!) 97.4 F (36.3 C)   TempSrc: Oral  Axillary   SpO2: 94%     Weight:      Height:        Intake/Output Summary (Last 24 hours) at 11/14/2018 1217 Last data filed at 11/14/2018 0900 Gross per 24 hour  Intake 600  ml  Output 0 ml  Net 600 ml   Filed Weights   11/09/18 1203 11/11/18 0746 11/11/18 1210  Weight: 68.6 kg 70.7 kg 69.2 kg    Examination:  General exam: Pleasant elderly male, moderately built and nourished lying comfortably propped up in bed. Respiratory system: Clear to auscultation.  No increased work of breathing. Cardiovascular system: S1-S2 heard, irregularly irregular.  No JVD or murmurs.  Telemetry personally reviewed shows atrial flutter with controlled ventricular rate. Gastrointestinal system: Abdomen is obese, soft and nontender. Normal bowel sounds heard.  Stable. Extremities: Left little toe with changes of dry gangrene.  Also has darkish discoloration of other toes of his left feet.  Absent bilateral dorsalis pedis and posterior tibial pulsations.  Left upper arm AV fistula. CNS: Alert and oriented x3.  No focal neurological deficits.   Data Reviewed: I have personally reviewed following labs and imaging studies  CBC: Recent Labs  Lab 11/07/18 2046 11/08/18 0016 11/09/18 0230 11/11/18 0814 11/14/18 0241  WBC 9.3 8.0 7.7 8.0 9.8  NEUTROABS  --  5.6 5.0  --  7.4  HGB 8.9* 8.5* 8.7* 8.8* 8.9*  HCT 29.8* 27.2* 28.0*  27.1* 29.1* 28.9*  MCV 104.9* 103.8* 103.7* 103.6* 101.8*  PLT 160 147* 138* 151 262   Basic Metabolic Panel: Recent Labs  Lab 11/07/18 2046 11/08/18 0016 11/09/18 0230 11/11/18 0814 11/14/18 0241  NA 136 138 137 134* 135  K 3.8 3.8 4.1 4.1 5.0  CL 99 100 100 96* 97*  CO2 28 26 27 24 25   GLUCOSE 116* 105* 94 95 139*  BUN 13 15 39* 58* 72*  CREATININE 2.89* 3.19* 5.34* 6.55* 7.61*  CALCIUM 8.7* 8.8* 9.1 9.3 9.8  MG  --  2.0 2.2  --  2.4  PHOS  --   --   --  2.7 2.4*   GFR: Estimated Creatinine Clearance: 7.2 mL/min (A) (by C-G formula based on SCr of 7.61 mg/dL (H)). Liver Function Tests: Recent Labs  Lab 11/07/18 2046 11/09/18 0230 11/11/18 0814 11/14/18 0241  AST 26 20  --   --   ALT 27 26  --   --   ALKPHOS 198* 197*  --   --    BILITOT 1.1 0.8  --   --   PROT 7.0 6.9  --   --   ALBUMIN 3.0* 2.8* 2.9* 3.1*    Recent Labs  Lab 11/09/18 0230  AMMONIA 42*   CBG: Recent Labs  Lab 11/13/18 1206 11/13/18 1717 11/13/18 2119 11/14/18 0752 11/14/18 1150  GLUCAP 108* 100* 131* 106* 105*   Anemia Panel: Recent Labs    11/11/18 1920  VITAMINB12 577     Recent Results (from the past 240 hour(s))  Blood culture (routine x 2)     Status: None   Collection Time: 11/06/18  8:30 PM  Result Value Ref Range Status   Specimen Description BLOOD RIGHT ARM  Final   Special Requests   Final    BOTTLES DRAWN AEROBIC ONLY Blood Culture adequate volume   Culture   Final    NO GROWTH 5 DAYS  Performed at [facility], [place] [address], [place], [place] [address]    Report Status 11/11/2018 FINAL  Final  Blood culture (routine x 2)     Status: None   Collection Time: 11/06/18  8:50 PM  Result Value Ref Range Status   Specimen Description BLOOD RIGHT HAND  Final   Special Requests   Final    BOTTLES DRAWN AEROBIC AND ANAEROBIC Blood Culture adequate volume   Culture   Final    NO GROWTH 5 DAYS Performed at [facility], [place] [address] [place], [place] [address]    Report Status 11/11/2018 FINAL  Final         Radiology Studies: Mr Virgel Paling DZ Contrast  Result Date: 11/13/2018 CLINICAL DATA:  Stroke follow-up. EXAM: MRA HEAD WITHOUT CONTRAST TECHNIQUE: Angiographic images of the Circle of Willis were obtained using MRA technique without intravenous contrast. COMPARISON:  Head MRI 11/11/2018. No prior cerebral angiographic imaging. FINDINGS: The study is severely motion degraded despite repeated imaging attempts. The visualized distal vertebral arteries are patent to the basilar and codominant. The basilar artery is patent without evidence of flow limiting stenosis. A moderately large left posterior communicating artery is identified. The distal left P1 segment is poorly visualized which may reflect  artifact, hypoplasia, or stenosis. The right P1 and P2 segments are patent without evidence of flow limiting stenosis. The intracranial internal carotid arteries are patent with extensive motion artifact limiting assessment of the cavernous and supraclinoid segments. Poor visualization of the right M1 segment is felt to be artifactual given a normal flow void on the recent MRI and presence of flow in proximal M2 vessels. The left M1 segment is patent with nondiagnostic assessment for stenosis due to artifact. The A1 and A2 segments are grossly patent bilaterally. No large aneurysm is identified. IMPRESSION: Severely motion degraded study as above. No definite large vessel occlusion. Electronically Signed   By: Logan Bores M.D.   On: 11/13/2018 11:17        Scheduled Meds: . aspirin EC  325 mg Oral Daily  . Chlorhexidine Gluconate Cloth  6 each Topical Q0600  . darbepoetin (ARANESP) injection - DIALYSIS  60 mcg Intravenous Q Wed-HD  . doxercalciferol  2 mcg Intravenous Q M,W,F-HD  . feeding supplement (ENSURE ENLIVE)  237 mL Oral BID BM  . feeding supplement (PRO-STAT SUGAR FREE 64)  30 mL Oral Daily  . heparin  5,000 Units Subcutaneous Q8H  . multivitamin  1 tablet Oral QHS  . Warfarin - Pharmacist Dosing Inpatient   Does not apply q1800   Continuous Infusions: . ferric gluconate (FERRLECIT/NULECIT) IV 62.5 mg (11/09/18 1058)     LOS: 8 days     Vernell Leep, MD, FACP, [facility]. [facility] Pager [phone]  If 7PM-7AM, please contact night-coverage [email or web address] Password [facility] [address] 11/14/2018, 12:47 PM

## 2018-11-14 NOTE — Progress Notes (Signed)
Initial Nutrition Assessment  DOCUMENTATION CODES:   Not applicable  INTERVENTION:   - Discontinued calorie count, given 75-100% of last 6 meals completed - Add Ensure Enlive BID - Add ProStat daily - Add Rena-vit daily   NUTRITION DIAGNOSIS:   Inadequate oral intake related to acute illness as evidenced by per patient/family report.   GOAL:   Patient will meet greater than or equal to 90% of their needs   MONITOR:   PO intake, Supplement acceptance, Skin, Labs  REASON FOR ASSESSMENT:   Consult Calorie Count  ASSESSMENT:   77 yo male, admitted with acute hypoxemic respiratory failure. PMH significant for PAF, CHF, ESRD on HD MWF, diabetes, HLD, OSA, anemia, h/o GI bleed, secondary hyperparathyroidism of renal origin. Recently admitted 10/24-11/5/19 for acute respiratory failure with hypoxia d/t volume overload vs pneumonia, sepsis, and acute metabolic encephalopathy.  Labs: chloride 97, glucose 139, BUN 72, Creatinine 7.61, phosphorus 2.4, GFR 6%/7%, Hgb 8.9, Hct 28.9% Meds reviewed  Pt awake, but sleepy at time of visit, dozed off several times. Reports poor appetite, unable to state for how long. States his height is 5'7". Pt unable to answer most questions.  UBW 150-160# (68.1-72.7 kg)  Pt amenable to trying Ensure Enlive BID and ProStat daily. Encourage pt to eat protein foods first at meal times, especially if appetite is poor.   Given recent 75-100% meal completion, as documented by nsg, calorie count does not appear necessary at this time.  NUTRITION - FOCUSED PHYSICAL EXAM:   Most Recent Value  Orbital Region  No depletion  Upper Arm Region  Mild depletion  Thoracic and Lumbar Region  No depletion  Buccal Region  No depletion  Temple Region  No depletion  Clavicle Bone Region  No depletion  Clavicle and Acromion Bone Region  No depletion  Scapular Bone Region  No depletion  Dorsal Hand  No depletion  Patellar Region  Mild depletion  Anterior Thigh  Region  Mild depletion  Posterior Calf Region  Mild depletion  Edema (RD Assessment)  None  Hair  Reviewed  Eyes  Reviewed  Mouth  Reviewed  Skin  Reviewed  Nails  Reviewed      Diet Order:  75-100% of last 6 meals completed, per nsg documentation Diet Order            Diet renal/carb modified with fluid restriction Diet-HS Snack? Nothing; Fluid restriction: 1200 mL Fluid; Room service appropriate? Yes; Fluid consistency: Thin  Diet effective now              EDUCATION NEEDS:  No education needs have been identified at this time  Skin:     Last BM:  11/16, type 6  Height:  Ht Readings from Last 1 Encounters:  11/07/18 5\' 6"  (1.676 m)    Weight:  Wt Readings from Last 1 Encounters:  11/11/18 69.2 kg    Ideal Body Weight:  64.5 kg  BMI:  Body mass index is 24.62 kg/m.  Estimated Nutritional Needs:   Kcal:  2076-2422 calories daily (30-35 kcal/kg ABW)  Protein:  83-104 g protein daily (1.2 - 1.5 g/kg ABW)  Fluid:  >/= 1.5 L daily or per MD discretion  Althea Grimmer, MS, RDN, LDN On-call pager: [phone]

## 2018-11-14 NOTE — Progress Notes (Signed)
Physical Therapy Treatment Patient Details Name: [name] MRN: [id number] DOB: July 11, 1941 Today's Date: 11/14/2018    History of Present Illness 77yo male sent to the ED from his SNF due to AMS and BP 60/40. Head CT negative for acute changes. Diagnosed with sepsis. PMH A-fib, CHF, CKD, DM, HTN, on HD x3/week     PT Comments    Patient progressing with mobility, tolerate bout of ambulation in halls today with RW. Increased time and effort with HR elevation to 130s and O2 desaturation to 87% on room air. Replaced 2 liters. Current POC remains appropriate.   Follow Up Recommendations  SNF;Supervision/Assistance - 24 hour     Equipment Recommendations  Other (comment)(TBA)    Recommendations for Other Services       Precautions / Restrictions Precautions Precautions: Fall;Other (comment) Precaution Comments: chronic wounds on buttocks and feet  Restrictions Weight Bearing Restrictions: No    Mobility  Bed Mobility Overal bed mobility: Needs Assistance Bed Mobility: Supine to Sit;Sit to Supine     Supine to sit: Min assist Sit to supine: Min assist   General bed mobility comments: min assist for pull to sit, assisst to elevate LEs back to bed  Transfers Overall transfer level: Needs assistance Equipment used: Rolling walker (2 wheeled) Transfers: Sit to/from [facility] Sit to Stand: Min assist         General transfer comment: Min assist for stability during power up to standing, increased effort. Performed x3  Ambulation/Gait Ambulation/Gait assistance: Min assist Gait Distance (Feet): 80 Feet Assistive device: Rolling walker (2 wheeled) Gait Pattern/deviations: Step-through pattern;Decreased stride length Gait velocity: decreased Gait velocity interpretation: <1.8 ft/sec, indicate of risk for recurrent falls General Gait Details: 3 standing rest breaks due to pain in left foot and increased DOE   Stairs             Wheelchair  Mobility    Modified Rankin (Stroke Patients Only)       Balance Overall balance assessment: Needs assistance Sitting-balance support: Feet supported;No upper extremity supported Sitting balance-Leahy Scale: Good Sitting balance - Comments: able to don/doff socks EOB without LOB    Standing balance support: Single extremity supported;During functional activity Standing balance-Leahy Scale: Poor Standing balance comment: requires UE support                             Cognition Arousal/Alertness: Awake/alert Behavior During Therapy: Flat affect Overall Cognitive Status: No family/caregiver present to determine baseline cognitive functioning                                 General Comments: Pt not oriented to situation, but was appropriate, followed multistep commands      Exercises      General Comments General comments (skin integrity, edema, etc.): saturations to 87% on room air, replaced 2 liters.       Pertinent Vitals/Pain Pain Assessment: Faces Faces Pain Scale: Hurts little more Pain Location: left foot Pain Descriptors / Indicators: Sore Pain Intervention(s): Monitored during session    Home Living                      Prior Function            PT Goals (current goals can now be found in the care plan section) Acute Rehab PT Goals Patient Stated Goal: to  go home  PT Goal Formulation: Patient unable to participate in goal setting Time For Goal Achievement: 11/21/18 Potential to Achieve Goals: Fair Progress towards PT goals: Progressing toward goals    Frequency    Min 2X/week      PT Plan Current plan remains appropriate    Co-evaluation              AM-PAC PT "6 Clicks" Daily Activity  Outcome Measure  Difficulty turning over in bed (including adjusting bedclothes, sheets and blankets)?: Unable Difficulty moving from lying on back to sitting on the side of the bed? : Unable Difficulty sitting down  on and standing up from a chair with arms (e.g., wheelchair, bedside commode, etc,.)?: Unable Help needed moving to and from a bed to chair (including a wheelchair)?: A Little Help needed walking in hospital room?: A Little Help needed climbing 3-5 steps with a railing? : A Lot 6 Click Score: 11    End of Session Equipment Utilized During Treatment: Gait belt Activity Tolerance: Patient tolerated treatment well Patient left: in bed;with call bell/phone within reach;with bed alarm set Nurse Communication: Mobility status PT Visit Diagnosis: Muscle weakness (generalized) (M62.81);Other abnormalities of gait and mobility (R26.89);Other symptoms and signs involving the nervous system (X42.767)     Time: 0110-0349 PT Time Calculation (min) (ACUTE ONLY): 18 min  Charges:  $Gait Training: 8-22 mins                     Alben Deeds, PT DPT  Board Certified Neurologic Specialist [place] Pager [phone] Office [phone]    Duncan Dull 11/14/2018, 9:46 AM

## 2018-11-14 NOTE — Consult Note (Addendum)
[facility] NOTE   MRN : [id number]  Reason for Consult: dry gangrenous left little toe Referring Physician: Dr. Marthenia Rolling  History of Present Illness: 77 y/o male Admitted for acute hypoxia.  He is not a very good historian.  When asked where he lives he said he lives at [facility].  Information was taken from the chart.  During the work up it was discovered he has a left 5th toe area of dry gangrene.  We have been consult to evaluate perfusion the left LE.    Past medical history includes: PMH of PAF, chronic diastolic CHF, ESRD on MWF HD, DM 2, HTN, HLD, OSA,?  On home oxygen, hospitalized 10/20/2018-11/01/2018 for acute respiratory failure.    Coumadin has been recommended, with INR goal of 2-2.5, and with aspirin bridge (aspirin to be discontinued when Coumadin gets to goal).       Current Facility-Administered Medications  Medication Dose Route Frequency Provider Last Rate Last Dose  . acetaminophen (TYLENOL) tablet 650 mg  650 mg Oral Q6H PRN Shela Leff, MD   650 mg at 11/10/18 2338  . aspirin EC tablet 325 mg  325 mg Oral Daily Shela Leff, MD   325 mg at 11/13/18 1000  . camphor-menthol (SARNA) lotion   Topical PRN Dana Allan I, MD      . Chlorhexidine Gluconate Cloth 2 % PADS 6 each  6 each Topical Q0600 Roney Jaffe, MD      . Darbepoetin Alfa (ARANESP) injection 60 mcg  60 mcg Intravenous Q Wed-HD Roney Jaffe, MD   60 mcg at 11/09/18 1055  . doxercalciferol (HECTOROL) injection 2 mcg  2 mcg Intravenous Q M,W,F-HD Roney Jaffe, MD      . ferric gluconate (NULECIT) 62.5 mg in sodium chloride 0.9 % 100 mL IVPB  62.5 mg Intravenous Q Wed-HD Roney Jaffe, MD 105 mL/hr at 11/09/18 1058 62.5 mg at 11/09/18 1058  . heparin injection 5,000 Units  5,000 Units Subcutaneous Q8H Shela Leff, MD   5,000 Units at 11/14/18 0514  . hydrOXYzine (ATARAX/VISTARIL) tablet 25 mg  25 mg Oral Q8H PRN Shela Leff, MD      .  ipratropium-albuterol (DUONEB) 0.5-2.5 (3) MG/3ML nebulizer solution 3 mL  3 mL Nebulization Q4H PRN Bodenheimer, Charles A, NP   3 mL at 11/10/18 1514  . Warfarin - Pharmacist Dosing Inpatient   Does not apply [id number] Bonnell Public, MD        Pt meds include: Statin :No Betablocker: Yes ASA: Yes Other anticoagulants/antiplatelets: Coumadin  Past Medical History:  Diagnosis Date  . A-fib ([place])   . Anemia   . Blood transfusion   . BPH (benign prostatic hyperplasia)   . CHF (congestive heart failure) ([place])   . Diarrhea   . DM (diabetes mellitus) ([place])   . ESRD on hemodialysis ([place])    Started dialysis in 2009  . History of GI bleed    secondary to coumadin  . HTN (hypertension)   . Hyperlipidemia   . OSA (obstructive sleep apnea)    uses CPAP  . Secondary hyperparathyroidism of renal origin [facility])     Past Surgical History:  Procedure Laterality Date  . BVT  2/95/18   Left  Basilic Vein Transposition  . CHOLECYSTECTOMY    . EYE SURGERY     Catarct bil  . INSERTION OF DIALYSIS CATHETER  05/28/2012   Procedure: INSERTION OF DIALYSIS CATHETER;  Surgeon: Mal Misty, MD;  Location:  [facility] OR;  Service: Vascular;  Laterality: Right;  . Left arm shuntogram.    . Left forearm loop graft with 6 mm Gore-Tex graft.    Purvis [place] plana vitrectomy with 25-gauge system      Social History Social History   Tobacco Use  . Smoking status: Former Smoker    Types: Cigarettes    Last attempt to quit: 06/30/2004    Years since quitting: 14.3  . Smokeless tobacco: Never Used  Substance Use Topics  . Alcohol use: No  . Drug use: No    Family History Family History  Problem Relation Age of Onset  . Alzheimer's disease Mother   . Diabetes Father        Amputation:  bilateral legs  . Cancer Daughter        breast cancer  . Diabetes Son   . Heart disease Son        before age 13  . Hypertension Son   . Anesthesia problems Neg Hx     Allergies  Allergen Reactions  . Tape  Itching and Other (See Comments)    Cloth tape only     REVIEW OF SYSTEMS  General: [ ]  Weight loss, [ ]  Fever, [ ]  chills Neurologic: [ ]  Dizziness, [ ]  Blackouts, [ ]  Seizure [ ]  Stroke, [ ]  "Mini stroke", [ ]  Slurred speech, [ ]  Temporary blindness; [ ]  weakness in arms or legs, [ ]  Hoarseness [ ]  Dysphagia Cardiac: [ ]  Chest pain/pressure, [ ]  Shortness of breath at rest [ ]  Shortness of breath with exertion, [ ]  Atrial fibrillation or irregular heartbeat  Vascular: [ ]  Pain in legs with walking, [ ]  Pain in legs at rest, [ ]  Pain in legs at night,  [ ]  Non-healing ulcer, [ ]  Blood clot in vein/DVT,   Pulmonary: [ ]  Home oxygen, [ ]  Productive cough, [ ]  Coughing up blood, [ ]  Asthma,  [ ]  Wheezing [ ]  COPD Musculoskeletal:  [ ]  Arthritis, [ ]  Low back pain, [ ]  Joint pain Hematologic: [ ]  Easy Bruising, [ ]  Anemia; [ ]  Hepatitis Gastrointestinal: [ ]  Blood in stool, [ ]  Gastroesophageal Reflux/heartburn, Urinary: [ ]  chronic Kidney disease, [ ]  on HD - [ ]  MWF or [ ]  TTHS, [ ]  Burning with urination, [ ]  Difficulty urinating Skin: [ ]  Rashes, [ ]  Wounds Psychological: [ ]  Anxiety, [ ]  Depression [x]  dementia   Physical Examination Vitals:   11/13/18 2119 11/13/18 2254 11/13/18 2357 11/14/18 0750  BP: 120/61  (!) 144/76   Pulse: 61   90  Resp: (!) 25 18 (!) 25 20  Temp: 97.7 F (36.5 C)  (!) 97.4 F (36.3 C)   TempSrc: Oral  Axillary   SpO2: 94%     Weight:      Height:       Body mass index is 24.62 kg/m.  General:  WDWN in NAD HENT: WNL, normocephalic Eyes: Pupils equal Pulmonary: normal non-labored breathing , with crackles B Cardiac: irregularly irregular A fib No carotid bruits Abdomen: soft, NT, no masses Skin: no rashes, ulcers noted;  Left 5th toe Gangrene , positive 3erd and 4th toes cellulitis; no open wounds;      Vascular Exam/Pulses:Radial, Left UE AV fistula thrill, femorals, right AT palpable pulse.   Musculoskeletal: no muscle wasting or  atrophy; no edema  Neurologic: A&O X 3; Appropriate Affect ;  SENSATION: normal; MOTOR FUNCTION: moving all 4 ext. Speech is fluent/normal  Significant Diagnostic Studies: CBC Lab Results  Component Value Date   WBC 9.8 11/14/2018   HGB 8.9 (L) 11/14/2018   HCT 28.9 (L) 11/14/2018   MCV 101.8 (H) 11/14/2018   PLT 155 11/14/2018    BMET    Component Value Date/Time   NA 135 11/14/2018 0241   K 5.0 11/14/2018 0241   CL 97 (L) 11/14/2018 0241   CO2 25 11/14/2018 0241   GLUCOSE 139 (H) 11/14/2018 0241   BUN 72 (H) 11/14/2018 0241   CREATININE 7.61 (H) 11/14/2018 0241   CALCIUM 9.8 11/14/2018 0241   CALCIUM 8.3 (L) 10/25/2008 1450   GFRNONAA 6 (L) 11/14/2018 0241   GFRAA 7 (L) 11/14/2018 0241   Estimated Creatinine Clearance: 7.2 mL/min (A) (by C-G formula based on SCr of 7.61 mg/dL (H)).  COAG Lab Results  Component Value Date   INR 1.28 11/14/2018   INR 1.58 10/24/2018   INR 1.52 10/22/2018     Non-Invasive Vascular Imaging:   ABI Findings: 11/11/2018 +--------+------------------+-----+----------+--------+ Right  Rt Pressure (mmHg)IndexWaveform Comment  +--------+------------------+-----+----------+--------+ [id number]           triphasic      +--------+------------------+-----+----------+--------+ PTA   69        0.63 monophasic     +--------+------------------+-----+----------+--------+ DP   62        0.57 monophasic     +--------+------------------+-----+----------+--------+  +--------+------------------+-----+----------+-------------------------------+ Left  Lt Pressure (mmHg)IndexWaveform Comment             +--------+------------------+-----+----------+-------------------------------+ Brachial                 unable to obtain due to fistula +--------+------------------+-----+----------+-------------------------------+ PTA   63         0.58 monophasic                 +--------+------------------+-----+----------+-------------------------------+ DP                    not audible           +--------+------------------+-----+----------+-------------------------------+  +-------+-----------+-----------+------------+------------+ ABI/TBIToday's ABIToday's TBIPrevious ABIPrevious TBI +-------+-----------+-----------+------------+------------+ Right 0.63                       +-------+-----------+-----------+------------+------------+ Left  0.58                       +-------+-----------+-----------+------------+------------+     Summary: Right: Resting right ankle-brachial index indicates moderate right lower extremity arterial disease.  Left: Resting left ankle-brachial index indicates moderate left lower extremity arterial disease.  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    Hold Coumadin for now until vascular intervention can be determined.     Roxy Horseman 11/14/2018 10:35 AM   I have independently interviewed and examined patient and agree with PA assessment and plan above.  Currently on dialysis via left arm access.  Coumadin is being held.  Plan for aortogram bilateral lower extremity runoff possible intervention on the left tomorrow with Dr. Trula Slade.  N.p.o. past midnight.  Kerah Hardebeck C. Donzetta Matters, MD [facility] [place] Office: [phone] Pager: [phone]

## 2018-11-14 NOTE — H&P (View-Only) (Signed)
[facility] [place] NOTE   MRN : [id number]  Reason for Consult: dry gangrenous left little toe Referring Physician: Dr. Marthenia Rolling  History of Present Illness: 77 y/o male Admitted for acute hypoxia.  He is not a very good historian.  When asked where he lives he said he lives at [facility].  Information was taken from the chart.  During the work up it was discovered he has a left 5th toe area of dry gangrene.  We have been consult to evaluate perfusion the left LE.    Past medical history includes: PMH of PAF, chronic diastolic CHF, ESRD on MWF HD, DM 2, HTN, HLD, OSA,?  On home oxygen, hospitalized 10/20/2018-11/01/2018 for acute respiratory failure.    Coumadin has been recommended, with INR goal of 2-2.5, and with aspirin bridge (aspirin to be discontinued when Coumadin gets to goal).       Current Facility-Administered Medications  Medication Dose Route Frequency Provider Last Rate Last Dose  . acetaminophen (TYLENOL) tablet 650 mg  650 mg Oral Q6H PRN Shela Leff, MD   650 mg at 11/10/18 2338  . aspirin EC tablet 325 mg  325 mg Oral Daily Shela Leff, MD   325 mg at 11/13/18 1000  . camphor-menthol (SARNA) lotion   Topical PRN Dana Allan I, MD      . Chlorhexidine Gluconate Cloth 2 % PADS 6 each  6 each Topical Q0600 Roney Jaffe, MD      . Darbepoetin Alfa (ARANESP) injection 60 mcg  60 mcg Intravenous Q Wed-HD Roney Jaffe, MD   60 mcg at 11/09/18 1055  . doxercalciferol (HECTOROL) injection 2 mcg  2 mcg Intravenous Q M,W,F-HD Roney Jaffe, MD      . ferric gluconate (NULECIT) 62.5 mg in sodium chloride 0.9 % 100 mL IVPB  62.5 mg Intravenous Q Wed-HD Roney Jaffe, MD 105 mL/hr at 11/09/18 1058 62.5 mg at 11/09/18 1058  . heparin injection 5,000 Units  5,000 Units Subcutaneous Q8H Shela Leff, MD   5,000 Units at 11/14/18 0514  . hydrOXYzine (ATARAX/VISTARIL) tablet 25 mg  25 mg Oral Q8H PRN Shela Leff, MD      .  ipratropium-albuterol (DUONEB) 0.5-2.5 (3) MG/3ML nebulizer solution 3 mL  3 mL Nebulization Q4H PRN Bodenheimer, Charles A, NP   3 mL at 11/10/18 1514  . Warfarin - Pharmacist Dosing Inpatient   Does not apply [id number] Bonnell Public, MD        Pt meds include: Statin :No Betablocker: Yes ASA: Yes Other anticoagulants/antiplatelets: Coumadin  Past Medical History:  Diagnosis Date  . A-fib ([place])   . Anemia   . Blood transfusion   . BPH (benign prostatic hyperplasia)   . CHF (congestive heart failure) ([place])   . Diarrhea   . DM (diabetes mellitus) ([place])   . ESRD on hemodialysis ([place])    Started dialysis in 2009  . History of GI bleed    secondary to coumadin  . HTN (hypertension)   . Hyperlipidemia   . OSA (obstructive sleep apnea)    uses CPAP  . Secondary hyperparathyroidism of renal origin [facility])     Past Surgical History:  Procedure Laterality Date  . BVT  5/40/08   Left  Basilic Vein Transposition  . CHOLECYSTECTOMY    . EYE SURGERY     Catarct bil  . INSERTION OF DIALYSIS CATHETER  05/28/2012   Procedure: INSERTION OF DIALYSIS CATHETER;  Surgeon: Mal Misty, MD;  Location:  [facility] OR;  Service: Vascular;  Laterality: Right;  . Left arm shuntogram.    . Left forearm loop graft with 6 mm Gore-Tex graft.    Purvis [place] plana vitrectomy with 25-gauge system      Social History Social History   Tobacco Use  . Smoking status: Former Smoker    Types: Cigarettes    Last attempt to quit: 06/30/2004    Years since quitting: 14.3  . Smokeless tobacco: Never Used  Substance Use Topics  . Alcohol use: No  . Drug use: No    Family History Family History  Problem Relation Age of Onset  . Alzheimer's disease Mother   . Diabetes Father        Amputation:  bilateral legs  . Cancer Daughter        breast cancer  . Diabetes Son   . Heart disease Son        before age 77  . Hypertension Son   . Anesthesia problems Neg Hx     Allergies  Allergen Reactions  . Tape  Itching and Other (See Comments)    Cloth tape only     REVIEW OF SYSTEMS  General: [ ]  Weight loss, [ ]  Fever, [ ]  chills Neurologic: [ ]  Dizziness, [ ]  Blackouts, [ ]  Seizure [ ]  Stroke, [ ]  "Mini stroke", [ ]  Slurred speech, [ ]  Temporary blindness; [ ]  weakness in arms or legs, [ ]  Hoarseness [ ]  Dysphagia Cardiac: [ ]  Chest pain/pressure, [ ]  Shortness of breath at rest [ ]  Shortness of breath with exertion, [ ]  Atrial fibrillation or irregular heartbeat  Vascular: [ ]  Pain in legs with walking, [ ]  Pain in legs at rest, [ ]  Pain in legs at night,  [ ]  Non-healing ulcer, [ ]  Blood clot in vein/DVT,   Pulmonary: [ ]  Home oxygen, [ ]  Productive cough, [ ]  Coughing up blood, [ ]  Asthma,  [ ]  Wheezing [ ]  COPD Musculoskeletal:  [ ]  Arthritis, [ ]  Low back pain, [ ]  Joint pain Hematologic: [ ]  Easy Bruising, [ ]  Anemia; [ ]  Hepatitis Gastrointestinal: [ ]  Blood in stool, [ ]  Gastroesophageal Reflux/heartburn, Urinary: [ ]  chronic Kidney disease, [ ]  on HD - [ ]  MWF or [ ]  TTHS, [ ]  Burning with urination, [ ]  Difficulty urinating Skin: [ ]  Rashes, [ ]  Wounds Psychological: [ ]  Anxiety, [ ]  Depression [x]  dementia   Physical Examination Vitals:   11/13/18 2119 11/13/18 2254 11/13/18 2357 11/14/18 0750  BP: 120/61  (!) 144/76   Pulse: 61   90  Resp: (!) 25 18 (!) 25 20  Temp: 97.7 F (36.5 C)  (!) 97.4 F (36.3 C)   TempSrc: Oral  Axillary   SpO2: 94%     Weight:      Height:       Body mass index is 24.62 kg/m.  General:  WDWN in NAD HENT: WNL, normocephalic Eyes: Pupils equal Pulmonary: normal non-labored breathing , with crackles B Cardiac: irregularly irregular A fib No carotid bruits Abdomen: soft, NT, no masses Skin: no rashes, ulcers noted;  Left 5th toe Gangrene , positive 3erd and 4th toes cellulitis; no open wounds;      Vascular Exam/Pulses:Radial, Left UE AV fistula thrill, femorals, right AT palpable pulse.   Musculoskeletal: no muscle wasting or  atrophy; no edema  Neurologic: A&O X 3; Appropriate Affect ;  SENSATION: normal; MOTOR FUNCTION: moving all 4 ext. Speech is fluent/normal  Significant Diagnostic Studies: CBC Lab Results  Component Value Date   WBC 9.8 11/14/2018   HGB 8.9 (L) 11/14/2018   HCT 28.9 (L) 11/14/2018   MCV 101.8 (H) 11/14/2018   PLT 155 11/14/2018    BMET    Component Value Date/Time   NA 135 11/14/2018 0241   K 5.0 11/14/2018 0241   CL 97 (L) 11/14/2018 0241   CO2 25 11/14/2018 0241   GLUCOSE 139 (H) 11/14/2018 0241   BUN 72 (H) 11/14/2018 0241   CREATININE 7.61 (H) 11/14/2018 0241   CALCIUM 9.8 11/14/2018 0241   CALCIUM 8.3 (L) 10/25/2008 1450   GFRNONAA 6 (L) 11/14/2018 0241   GFRAA 7 (L) 11/14/2018 0241   Estimated Creatinine Clearance: 7.2 mL/min (A) (by C-G formula based on SCr of 7.61 mg/dL (H)).  COAG Lab Results  Component Value Date   INR 1.28 11/14/2018   INR 1.58 10/24/2018   INR 1.52 10/22/2018     Non-Invasive Vascular Imaging:   ABI Findings: 11/11/2018 +--------+------------------+-----+----------+--------+ Right  Rt Pressure (mmHg)IndexWaveform Comment  +--------+------------------+-----+----------+--------+ [id number]           triphasic      +--------+------------------+-----+----------+--------+ PTA   69        0.63 monophasic     +--------+------------------+-----+----------+--------+ DP   62        0.57 monophasic     +--------+------------------+-----+----------+--------+  +--------+------------------+-----+----------+-------------------------------+ Left  Lt Pressure (mmHg)IndexWaveform Comment             +--------+------------------+-----+----------+-------------------------------+ Brachial                 unable to obtain due to fistula +--------+------------------+-----+----------+-------------------------------+ PTA   63         0.58 monophasic                 +--------+------------------+-----+----------+-------------------------------+ DP                    not audible           +--------+------------------+-----+----------+-------------------------------+  +-------+-----------+-----------+------------+------------+ ABI/TBIToday's ABIToday's TBIPrevious ABIPrevious TBI +-------+-----------+-----------+------------+------------+ Right 0.63                       +-------+-----------+-----------+------------+------------+ Left  0.58                       +-------+-----------+-----------+------------+------------+     Summary: Right: Resting right ankle-brachial index indicates moderate right lower extremity arterial disease.  Left: Resting left ankle-brachial index indicates moderate left lower extremity arterial disease.  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    Hold Coumadin for now until vascular intervention can be determined.     Roxy Horseman 11/14/2018 10:35 AM   I have independently interviewed and examined patient and agree with PA assessment and plan above.  Currently on dialysis via left arm access.  Coumadin is being held.  Plan for aortogram bilateral lower extremity runoff possible intervention on the left tomorrow with Dr. Trula Slade.  N.p.o. past midnight.  Cheetara Hoge C. Donzetta Matters, MD [facility] [place] Office: [phone] Pager: [phone]

## 2018-11-14 NOTE — Progress Notes (Signed)
[place] for warfarin Indication: atrial fibrillation / new CVA  Allergies  Allergen Reactions  . Tape Itching and Other (See Comments)    Cloth tape only    Patient Measurements: Height: 5\' 6"  (167.6 cm) Weight: 152 lb 8.9 oz (69.2 kg) IBW/kg (Calculated) : 63.8   Vital Signs: Temp: 97.8 F (36.6 C) (11/18 1235) Temp Source: Oral (11/18 1235) BP: 116/62 (11/18 1235) Pulse Rate: 87 (11/18 1235)  Labs: Recent Labs    11/14/18 0241  HGB 8.9*  HCT 28.9*  PLT 155  LABPROT 15.9*  INR 1.28  CREATININE 7.61*    Estimated Creatinine Clearance: 7.2 mL/min (A) (by C-G formula based on SCr of 7.61 mg/dL (H)).   Medical History: Past Medical History:  Diagnosis Date  . A-fib ([place])   . Anemia   . Blood transfusion   . BPH (benign prostatic hyperplasia)   . CHF (congestive heart failure) ([place])   . Diarrhea   . DM (diabetes mellitus) ([place])   . ESRD on hemodialysis ([place])    Started dialysis in 2009  . History of GI bleed    secondary to coumadin  . HTN (hypertension)   . Hyperlipidemia   . OSA (obstructive sleep apnea)    uses CPAP  . Secondary hyperparathyroidism of renal origin [facility])      Assessment: 77yom with multiple commorbities including ESRD,admitted for hypoxia, volume overload and MS change.   He has been in Afib HR in 100s but soft BP and not on rate control medications.  He has Hx of GIB and not on anticoagulation PTA.  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  Plan to begin warfarin - when INR 2 stop ASA, heparin sq.    Goal of Therapy:  INR goal 2-2.5 Monitor platelets by anticoagulation protocol: Yes   Plan:  Per note from [facility] today, will hold Coumadin for now. Awaiting plans.  [name], [facility] Clinical Pharmacist Phone [phone]  11/14/2018 3:00 PM

## 2018-11-14 NOTE — Progress Notes (Signed)
[facility] Progress Note   Assessment/ Plan:   Physical Exam: Dialysis:[facility] MWF 4h 70kg LUA AVF 2/2.5 bath Hep 2500  - hect 2 ug tiw - mircera 100 ug due 11/11 - venofer 50 /wk q wed  Impression/ Plan: 1. SOB/hypoxemia - suspected pulm edema + vol overload. Resolved w HD, last CXR 11/14 clear. ECHO showed severe RV dysfunction and question AoV disease (poor study for that). LVEF 60%.  2.  L 5th toe gangrene: d/w primary, [facility] c/s, likely has PAD  3. 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  Lives in a facility. Hx of dementia/ memory issues per primary discussions w/ family. Per primary team and also per neuro.   4. ESRD -MWF. HD today 5. 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  Will need non calcium-based binder anyway with likely PAD 7. Anemia ckd - esa due this week, gave darbe 60 ug here 11/13 8. Parox afib- on Coumadin 9. OSA 10. Hx DM2 - not on any medication now 11. Dispo: from renal perspective would be OK to go if no operative intervention planned for L 5th toe gangrene.     Subjective:    No complaints today- poor historian, wearing O2   Objective:   BP (!) 144/76 (BP Location: Right Arm)   Pulse 90   Temp (!) 97.4 F (36.3 C) (Axillary)   Resp 20   Ht 5\' 6"  (1.676 m)   Wt 69.2 kg   SpO2 94%   BMI 24.62 kg/m   Physical Exam: GEN NAD, sitting in bed HEENT sclerae anicteric NECK no JVD PULM clear anteriorly CV irregular, III/VI systolic murmur LUSB ABD obese, nontender EXT no LE edema, DP pulses poor bilaterally, LLE with gangrenous 5th digit NEURO alert to self and place ("[place]"), able to tell me that he has "a lot" of dialysis doctors ACCESS: LUE AVF + T/B  Labs: BMET Recent Labs  Lab 11/07/18 2046 11/08/18 0016 11/09/18 0230 11/11/18 0814 11/14/18 0241  NA 136 138 137 134* 135  K 3.8 3.8 4.1 4.1 5.0  CL 99 100 100  96* 97*  CO2 28 26 27 24 25   GLUCOSE 116* 105* 94 95 139*  BUN 13 15 39* 58* 72*  CREATININE 2.89* 3.19* 5.34* 6.55* 7.61*  CALCIUM 8.7* 8.8* 9.1 9.3 9.8  PHOS  --   --   --  2.7 2.4*   CBC Recent Labs  Lab 11/08/18 0016 11/09/18 0230 11/11/18 0814 11/14/18 0241  WBC 8.0 7.7 8.0 9.8  NEUTROABS 5.6 5.0  --  7.4  HGB 8.5* 8.7* 8.8* 8.9*  HCT 27.2* 28.0*  27.1* 29.1* 28.9*  MCV 103.8* 103.7* 103.6* 101.8*  PLT 147* 138* 151 155    @IMGRELPRIORS @ Medications:    . aspirin EC  325 mg Oral Daily  . Chlorhexidine Gluconate Cloth  6 each Topical Q0600  . darbepoetin (ARANESP) injection - DIALYSIS  60 mcg Intravenous Q Wed-HD  . doxercalciferol  2 mcg Intravenous Q M,W,F-HD  . feeding supplement (ENSURE ENLIVE)  237 mL Oral BID BM  . feeding supplement (PRO-STAT SUGAR FREE 64)  30 mL Oral Daily  . heparin  5,000 Units Subcutaneous Q8H  . multivitamin  1 tablet Oral QHS  . Warfarin - Pharmacist Dosing Inpatient   Does not apply [place], MD [facility] pgr [phone] 11/14/2018, 11:36 AM

## 2018-11-15 ENCOUNTER — Encounter (HOSPITAL_COMMUNITY)
Admission: EM | Disposition: A | Discharge status: Skilled Nursing Facility | Payer: Self-pay | Source: Home / Self Care | Attending: Internal Medicine

## 2018-11-15 DIAGNOSIS — I70245 Atherosclerosis of native arteries of left leg with ulceration of other part of foot: Secondary | ICD-10-CM

## 2018-11-15 HISTORY — PX: PERIPHERAL VASCULAR BALLOON ANGIOPLASTY: CATH118281

## 2018-11-15 HISTORY — PX: ABDOMINAL AORTOGRAM: CATH118222

## 2018-11-15 HISTORY — PX: PERIPHERAL VASCULAR ATHERECTOMY: CATH118256

## 2018-11-15 HISTORY — PX: LOWER EXTREMITY ANGIOGRAPHY: CATH118251

## 2018-11-15 LAB — POCT ACTIVATED CLOTTING TIME: ACTIVATED CLOTTING TIME: 241 s

## 2018-11-15 LAB — GLUCOSE, CAPILLARY
GLUCOSE-CAPILLARY: 92 mg/dL (ref 70–99)
GLUCOSE-CAPILLARY: 80 mg/dL (ref 70–99)
Glucose-Capillary: 89 mg/dL (ref 70–99)

## 2018-11-15 LAB — PROTIME-INR
INR: 1.37
Prothrombin Time: 16.7 seconds — ABNORMAL HIGH (ref 11.4–15.2)

## 2018-11-15 SURGERY — LOWER EXTREMITY ANGIOGRAPHY
Anesthesia: LOCAL

## 2018-11-15 MED ORDER — HEPARIN (PORCINE) IN NACL 1000-0.9 UT/500ML-% IV SOLN
INTRAVENOUS | Status: DC | PRN
  Administered 2018-11-15 (×2): 500 mL

## 2018-11-15 MED ORDER — IODIXANOL 320 MG/ML IV SOLN
INTRAVENOUS | Status: DC | PRN
  Administered 2018-11-15: 235 mL via INTRAVENOUS

## 2018-11-15 MED ORDER — PENTAFLUOROPROP-TETRAFLUOROETH EX AERO: 1.0000 "application " | INHALATION_SPRAY | CUTANEOUS | Status: DC | PRN

## 2018-11-15 MED ORDER — SODIUM CHLORIDE 0.9% FLUSH: 3.0000 mL | INTRAVENOUS | Status: DC | PRN

## 2018-11-15 MED ORDER — SODIUM CHLORIDE 0.9 % IV SOLN: 250.0000 mL | INTRAVENOUS | Status: DC | PRN

## 2018-11-15 MED ORDER — LABETALOL HCL 5 MG/ML IV SOLN: 10.0000 mg | INTRAVENOUS | Status: DC | PRN

## 2018-11-15 MED ORDER — ASPIRIN EC 81 MG PO TBEC
81.0000 mg | DELAYED_RELEASE_TABLET | Freq: Every day | ORAL | Status: DC
  Administered 2018-11-16: 81 mg via ORAL
  Filled 2018-11-15: qty 1

## 2018-11-15 MED ORDER — SODIUM CHLORIDE 0.9 % IV SOLN
100.0000 mL | INTRAVENOUS | Status: DC | PRN
  Administered 2018-11-16: 100 mL via INTRAVENOUS

## 2018-11-15 MED ORDER — HEPARIN SODIUM (PORCINE) 1000 UNIT/ML IJ SOLN
INTRAMUSCULAR | Status: DC | PRN
  Administered 2018-11-15: 7000 [IU] via INTRAVENOUS
  Administered 2018-11-15: 1000 [IU] via INTRAVENOUS

## 2018-11-15 MED ORDER — MIDAZOLAM HCL 2 MG/2ML IJ SOLN
INTRAMUSCULAR | Status: AC
  Filled 2018-11-15: qty 2

## 2018-11-15 MED ORDER — LIDOCAINE HCL (PF) 1 % IJ SOLN
INTRAMUSCULAR | Status: DC | PRN
  Administered 2018-11-15: 15 mL

## 2018-11-15 MED ORDER — CHLORHEXIDINE GLUCONATE CLOTH 2 % EX PADS: 6.0000 | MEDICATED_PAD | Freq: Every day | CUTANEOUS | Status: DC

## 2018-11-15 MED ORDER — MIDAZOLAM HCL 2 MG/2ML IJ SOLN
INTRAMUSCULAR | Status: DC | PRN
  Administered 2018-11-15: 1 mg via INTRAVENOUS

## 2018-11-15 MED ORDER — SODIUM CHLORIDE 0.9 % IV SOLN: 100.0000 mL | INTRAVENOUS | Status: DC | PRN

## 2018-11-15 MED ORDER — FENTANYL CITRATE (PF) 100 MCG/2ML IJ SOLN
INTRAMUSCULAR | Status: DC | PRN
  Administered 2018-11-15 (×2): 25 ug via INTRAVENOUS

## 2018-11-15 MED ORDER — HEPARIN (PORCINE) IN NACL 1000-0.9 UT/500ML-% IV SOLN
INTRAVENOUS | Status: AC
  Filled 2018-11-15: qty 1000

## 2018-11-15 MED ORDER — HYDRALAZINE HCL 20 MG/ML IJ SOLN: 5.0000 mg | INTRAMUSCULAR | Status: DC | PRN

## 2018-11-15 MED ORDER — SODIUM CHLORIDE 0.9% FLUSH
3.0000 mL | Freq: Two times a day (BID) | INTRAVENOUS | Status: DC
  Administered 2018-11-15 – 2018-11-17 (×4): 3 mL via INTRAVENOUS

## 2018-11-15 MED ORDER — LIDOCAINE-PRILOCAINE 2.5-2.5 % EX CREA: 1.0000 "application " | TOPICAL_CREAM | CUTANEOUS | Status: DC | PRN

## 2018-11-15 MED ORDER — HEPARIN SODIUM (PORCINE) 1000 UNIT/ML DIALYSIS: 1000.0000 [IU] | INTRAMUSCULAR | Status: DC | PRN

## 2018-11-15 MED ORDER — LIDOCAINE HCL (PF) 1 % IJ SOLN: 5.0000 mL | INTRAMUSCULAR | Status: DC | PRN

## 2018-11-15 MED ORDER — ONDANSETRON HCL 4 MG/2ML IJ SOLN: 4.0000 mg | Freq: Four times a day (QID) | INTRAMUSCULAR | Status: DC | PRN

## 2018-11-15 MED ORDER — FENTANYL CITRATE (PF) 100 MCG/2ML IJ SOLN
INTRAMUSCULAR | Status: AC
  Filled 2018-11-15: qty 2

## 2018-11-15 MED ORDER — LIDOCAINE HCL (PF) 1 % IJ SOLN
INTRAMUSCULAR | Status: AC
  Filled 2018-11-15: qty 30

## 2018-11-15 MED ORDER — ACETAMINOPHEN 325 MG PO TABS: 650.0000 mg | ORAL_TABLET | ORAL | Status: DC | PRN

## 2018-11-15 SURGICAL SUPPLY — 34 items
BALL STERLING OTW 2.5X100X150 (BALLOONS) ×1
BALLN STERLING OTW 2.5X100X150 (BALLOONS) ×3
BALLN STERLING OTW 5X100X135 (BALLOONS) ×4
BALLN STERLING OTW 5X220X150 (BALLOONS) ×4
BALLOON STERLING OTW 5X100X135 (BALLOONS) IMPLANT
BALLOON STERLING OTW 5X220X150 (BALLOONS) IMPLANT
BALLOON STRLNG OTW 2.5X100X150 (BALLOONS) IMPLANT
BUR JETSTREAM XC 2.1/3.0 (BURR) IMPLANT
BURR JETSTREAM XC 2.1/3.0 (BURR) ×4
CATH ANGIO 5F BER2 100CM (CATHETERS) ×1 IMPLANT
CATH OMNI FLUSH 5F 65CM (CATHETERS) ×1 IMPLANT
CATH QUICKCROSS SUPP .035X90CM (MICROCATHETER) ×1 IMPLANT
CLOSURE MYNX CONTROL 6F/7F (Vascular Products) ×1 IMPLANT
DEVICE EMBOSHIELD NAV6 4.0-7.0 (FILTER) ×1 IMPLANT
DEVICE TORQUE H2O (MISCELLANEOUS) ×1 IMPLANT
DRAPE ZERO GRAVITY STERILE (DRAPES) ×1 IMPLANT
GUIDEWIRE ANGLED .035X150CM (WIRE) ×1 IMPLANT
KIT ENCORE 26 ADVANTAGE (KITS) ×1 IMPLANT
KIT MICROPUNCTURE NIT STIFF (SHEATH) ×1 IMPLANT
KIT PV (KITS) ×4 IMPLANT
LUBRICANT VIPERSLIDE CORONARY (MISCELLANEOUS) ×1 IMPLANT
SHEATH PINNACLE 5F 10CM (SHEATH) ×1 IMPLANT
SHEATH PINNACLE 7F 10CM (SHEATH) ×1 IMPLANT
SHEATH PINNACLE ST 7F 45CM (SHEATH) ×1 IMPLANT
SHEATH PROBE COVER 6X72 (BAG) ×1 IMPLANT
STENT ELUVIA 6X120X130 (Permanent Stent) ×1 IMPLANT
SYR MEDRAD MARK V 150ML (SYRINGE) ×4 IMPLANT
TAPE VIPERTRACK RADIOPAQ (MISCELLANEOUS) IMPLANT
TAPE VIPERTRACK RADIOPAQUE (MISCELLANEOUS) ×4
TRANSDUCER W/STOPCOCK (MISCELLANEOUS) ×4 IMPLANT
TRAY PV CATH (CUSTOM PROCEDURE TRAY) ×4 IMPLANT
WIRE BAREWIRE WORK .014X315CM (WIRE) ×1 IMPLANT
WIRE BENTSON .035X145CM (WIRE) ×1 IMPLANT
WIRE G V18X300CM (WIRE) ×1 IMPLANT

## 2018-11-15 NOTE — Op Note (Signed)
Patient name: [name] MRN: [id number] DOB: [birth year] Sex: male  11/15/2018 Pre-operative Diagnosis: Left foot ulcer Post-operative diagnosis:  Same Surgeon:  [name] Procedure Performed:  1.  Ultrasound-guided access, right femoral artery  2.  Abdominal aortogram  3.  Bilateral lower extremity runoff  4.  Atherectomy with drug-eluting stent, left superficial femoral-popliteal artery  5.  Angioplasty, left posterior tibial artery  6.  Conscious sedation (100 minutes)  7.  Closure device, Mynx   Indications: The patient has a nonhealing wound is left fifth toe.  He is here for further evaluation and intervention if indicated.  Procedure:  The patient was identified in the holding area and taken to room 8.  The patient was then placed supine on the table and prepped and draped in the usual sterile fashion.  A time out was called.  Conscious sedation was administered with the use of IV fentanyl and Versed under continuous physician and nurse monitoring.  Heart rate, blood pressure, and oxygen saturation were continuously monitored.  Ultrasound was used to evaluate the right common femoral artery.  It was patent .  A digital ultrasound image was acquired.  A micropuncture needle was used to access the right common femoral artery under ultrasound guidance.  An 018 wire was advanced without resistance and a micropuncture sheath was placed.  The 018 wire was removed and a benson wire was placed.  The micropuncture sheath was exchanged for a 5 french sheath.  An omniflush catheter was advanced over the wire to the level of L-1.  An abdominal angiogram was obtained.  Next, the catheter was pulled down to the aortic bifurcation and bilateral runoff was performed.   Findings:   Aortogram:   Renal arteries were not well visualized.  The infrarenal abdominal aorta is patent but calcified.  Bilateral common and external iliac arteries are patent and calcified.  The left hypogastric artery is  occluded.  There is a 80% stenosis at the origin of the right hypogastric artery.   Right Lower Extremity: The right common femoral and profundofemoral arteries are patent.  The superficial femoral artery is patent however the adductor canal it is heavily calcified with approximately a 75% stenosis.  Popliteal arteries patent throughout the course.  The peroneal and posterior tibial artery of the dominant runoff vessels but they are heavily calcified and diseased.  Imaging is somewhat limited by motion artifact.  Left Lower Extremity: The left common femoral and profundofemoral artery are widely patent.  Superficial femoral artery is patent however in the midportion at the level of the adductor canal there is heavy calcification with stenosis greater than 80%.  There is also a focal 50% lesion in the popliteal artery just proximal to the joint space.  The peroneal artery is a dominant runoff vessel.  Posterior tibial artery does reconstitute in the mid calf and provide most of the blood flow to the foot.  It appears to be occluded proximally.  Intervention: After the above images were acquired the decision was made to proceed with intervention.  A 7 French 45 cm sheath was advanced into the left superficial femoral artery.  The patient was fully heparinized.  Using a 035 Glidewire and a quick cross catheter, the stenosis within the popliteal artery was successfully crossed.  A 014 bare wire was then inserted and a NAV6 filter was inserted.  I then used the small jetstream device to perform atherectomy of the superficial femoral and popliteal artery.  2 passes down and  back were made with blades down and one pass down with blades up was performed.  I then used a 5 mm balloon and performed balloon angioplasty for 2 minutes.  I was satisfied with the results of the distal popliteal artery however there appeared to be residual luminal narrowing within the adductor canal and so I inserted a 6 x 120 balloon via stent  which was postdilated with a 5 mm balloon.  At this point, follow-up imaging revealed in-line flow down to the peroneal artery.  I removed the filter.  I then inserted a quick cross catheter down to the popliteal artery to better evaluate the tibial vessels.  The posterior tibial artery appeared to be occluded proximally but was the dominant vessel to the foot and so I elected to cannulate this.  A Berenstein 2 catheter and a V 18 wire were used to cannulate the posterior tibial artery.  I then performed primary balloon angioplasty using a 2.5 x 100 balloon, taking this to nominal pressure for 2 minutes.  Follow-up imaging revealed two-vessel runoff through the posterior tibial and peroneal artery.  Satisfied with these results, I elected to terminate the procedure.  The long 7 French sheath was exchanged out for a short 7 [place] sheath and a Mynx device was used for closure.  There were no immediate complications.  Impression:  #1  Greater than 80% stenosis within the left adductor canal treated using atherectomy using jetstream device.  Suboptimal results with angioplasty were encountered and so a 6 x 120 Olivia stent was placed.  #2  Occlusion of the proximal posterior tibial artery, successfully crossed and angioplastied with a 2.5 mm balloon  #3  Greater than 80% stenosis within the right superficial femoral artery  #4  Two-vessel runoff to the left leg via the posterior tibial and peroneal artery after intervention.    [name], M.D. [facility] [place] Office: [phone] Pager:  [phone]

## 2018-11-15 NOTE — Progress Notes (Signed)
  [facility] Progress Note   Assessment/ Plan:   Physical Exam: Dialysis:[facility] MWF 4h 70kg LUA AVF 2/2.5 bath Hep 2500  - hect 2 ug tiw - mircera 100 ug due 11/11 - venofer 50 /wk q wed  Impression/ Plan: 1. SOB/hypoxemia - suspected pulm edema + vol overload. Resolved w HD, last CXR 11/14 clear. ECHO showed severe RV dysfunction and question AoV disease (poor study for that). LVEF 60%.  2.  L 5th toe gangrene: d/w primary, [facility] c/s, for angio 3. 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  Lives in a facility. Hx of dementia/ memory issues per primary discussions w/ family. Per primary team and also per neuro.   4. ESRD -MWF. HD on schedule 5. 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  Will need non calcium-based binder anyway with likely PAD 7. Anemia ckd - esa due this week, gave darbe 60 ug here 11/13 8. Parox afib- on Coumadin 9. OSA 10. Hx DM2 - not on any medication now 11. Dispo: pending angio   Subjective:    Reports that both of his feet hurt today.  He is sleeping, arousable.  For angio today per notes.     Objective:   BP 126/60 (BP Location: Right Arm)   Pulse 85   Temp 97.7 F (36.5 C) (Oral)   Resp (!) 21   Ht 5\' 6"  (1.676 m)   Wt 70.6 kg   SpO2 100%   BMI 25.12 kg/m   Physical Exam: GEN NAD, lying in bed, sleeping and arousable HEENT sclerae anicteric NECK no JVD PULM clear anteriorly CV irregular, III/VI systolic murmur LUSB ABD obese, nontender EXT no LE edema, DP pulses poor bilaterally, LLE with gangrenous 5th digit NEURO alert to self and place ACCESS: LUE AVF + T/B  Labs: BMET Recent Labs  Lab 11/09/18 0230 11/11/18 0814 11/14/18 0241  NA 137 134* 135  K 4.1 4.1 5.0  CL 100 96* 97*  CO2 27 24 25   GLUCOSE 94 95 139*  BUN 39* 58* 72*  CREATININE 5.34* 6.55* 7.61*  CALCIUM 9.1 9.3 9.8  PHOS  --  2.7 2.4*    CBC Recent Labs  Lab 11/09/18 0230 11/11/18 0814 11/14/18 0241  WBC 7.7 8.0 9.8  NEUTROABS 5.0  --  7.4  HGB 8.7* 8.8* 8.9*  HCT 28.0*  27.1* 29.1* 28.9*  MCV 103.7* 103.6* 101.8*  PLT 138* 151 155    @IMGRELPRIORS @ Medications:    . aspirin EC  325 mg Oral Daily  . Chlorhexidine Gluconate Cloth  6 each Topical Q0600  . darbepoetin (ARANESP) injection - DIALYSIS  60 mcg Intravenous Q Wed-HD  . doxercalciferol  2 mcg Intravenous Q M,W,F-HD  . feeding supplement (ENSURE ENLIVE)  237 mL Oral BID BM  . feeding supplement (PRO-STAT SUGAR FREE 64)  30 mL Oral Daily  . heparin  5,000 Units Subcutaneous Q8H  . multivitamin  1 tablet Oral QHS     Madelon Lips, MD [facility] pgr [phone] 11/15/2018, 10:45 AM

## 2018-11-15 NOTE — Interval H&P Note (Signed)
History and Physical Interval Note:  11/15/2018 1:29 PM  [name]  has presented today for surgery, with the diagnosis of pvd with grangreen left foot  The various methods of treatment have been discussed with the patient and family. After consideration of risks, benefits and other options for treatment, the patient has consented to  Procedure(s): LOWER EXTREMITY ANGIOGRAPHY (N/A) as a surgical intervention .  The patient's history has been reviewed, patient examined, no change in status, stable for surgery.  I have reviewed the patient's chart and labs.  Questions were answered to the patient's satisfaction.     Annamarie Major

## 2018-11-15 NOTE — Progress Notes (Signed)
  Progress Note    11/15/2018 1:29 PM * No surgery date entered *  Subjective:  No overnight issues  Vitals:   11/15/18 0700 11/15/18 1240  BP: 126/60 116/67  Pulse: 85 99  Resp: (!) 21 20  Temp: 97.7 F (36.5 C) (!) 97.4 F (36.3 C)  SpO2: 100% 98%    Physical Exam: Awake and alert Palpable femoral pulses bilaterally, no palpable popliteal pulses Dry gangrene left small toe  CBC    Component Value Date/Time   WBC 9.8 11/14/2018 0241   RBC 2.84 (L) 11/14/2018 0241   HGB 8.9 (L) 11/14/2018 0241   HCT 28.9 (L) 11/14/2018 0241   HCT 27.1 (L) 11/09/2018 0230   PLT 155 11/14/2018 0241   MCV 101.8 (H) 11/14/2018 0241   MCH 31.3 11/14/2018 0241   MCHC 30.8 11/14/2018 0241   RDW 16.5 (H) 11/14/2018 0241   LYMPHSABS 1.1 11/14/2018 0241   MONOABS 1.0 11/14/2018 0241   EOSABS 0.3 11/14/2018 0241   BASOSABS 0.1 11/14/2018 0241    BMET    Component Value Date/Time   NA 135 11/14/2018 0241   K 5.0 11/14/2018 0241   CL 97 (L) 11/14/2018 0241   CO2 25 11/14/2018 0241   GLUCOSE 139 (H) 11/14/2018 0241   BUN 72 (H) 11/14/2018 0241   CREATININE 7.61 (H) 11/14/2018 0241   CALCIUM 9.8 11/14/2018 0241   CALCIUM 8.3 (L) 10/25/2008 1450   GFRNONAA 6 (L) 11/14/2018 0241   GFRAA 7 (L) 11/14/2018 0241    INR    Component Value Date/Time   INR 1.37 11/15/2018 0248     Intake/Output Summary (Last 24 hours) at 11/15/2018 1329 Last data filed at 11/15/2018 1241 Gross per 24 hour  Intake -  Output 1031 ml  Net -1031 ml     Assessment:  77 y.o. male with esrd and dry gangrene left small toe  Plan: I discussed performing aortogram bilateral lower extremity angiography possible intervention on the left with the daughter who has agreed and given consent.  Patient also seems to have limited understanding of this.  Will be able to discharge to nursing home tomorrow.   Brandon C. Donzetta Matters, MD [facility] [place] Office: [phone] Pager:  [phone]  11/15/2018 1:29 PM

## 2018-11-15 NOTE — Progress Notes (Signed)
Patient ID: [name], male   DOB: Oct 27, 1941, 77 y.o.   MRN: [id number]  PROGRESS NOTE    OLUWAFEMI VILLELLA  TIR:[id number] DOB: 1941/04/06 DOA: 11/06/2018 PCP: No primary care provider on file.   Brief Narrative:  77 year old male with PMH of PAF, chronic diastolic CHF, ESRD on MWF HD, DM 2, HTN, HLD, OSA,?  On home oxygen, hospitalized 10/20/2018-11/01/2018 for acute respiratory failure with hypoxia due to volume overload versus pneumonia, sepsis, acute metabolic encephalopathy, completed a course of antibiotics, volume management across dialysis, hypoxia resolved, now presented from nursing home with dyspnea and near syncope.  Admitted again for acute hypoxic respiratory failure secondary to volume overload & acute encephalopathy.  Nephrology consulted for dialysis needs.  Hypoxic respiratory failure has resolved with hemodialysis. Collateral information from patient's daughter revealed that patient has had memory problems for about 1 to 2 years, without formal diagnosis of dementia.  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  Patient was on aspirin prior to admission. Apparently, patient was on Coumadin but this was discontinued in 2012 due to likely lower GI bleed, probably diverticular bleed.  Colonoscopy at that time was said not to be nonrevealing.  Neurology was consulted for the acute infarct.  Coumadin has been recommended, with INR goal of 2-2.5, and with aspirin bridge (aspirin to be discontinued when Coumadin gets to goal).  Patient has dry gangrene of left little toe, with dark discoloration of left lower leg.  ABI shows moderate PAD.  VVS consulted on 11/18 who plan angiogram with bilateral lower extremity runoff and possible left lower extremity intervention on 11/19.  DC to SNF pending vascular surgery intervention and sign off.  Assessment & Plan:   Principal Problem:   Acute hypoxemic respiratory failure (HCC) Active Problems:   DM (diabetes mellitus)  (HCC)   OBSTRUCTIVE SLEEP APNEA   HYPERTENSION, BENIGN   PAF (paroxysmal atrial fibrillation) (HCC)   ESRD (end stage renal disease) (HCC)   Acute encephalopathy   Chronic anemia   Volume overload   Multifocal pneumonia   Acute metabolic encephalopathy   Physical deconditioning   Goals of care, counseling/discussion   Palliative care by specialist   DNR (do not resuscitate) discussion   Cerebral thrombosis with cerebral infarction   Acute CVA: - Stroke MD was consulted and completed stroke evaluation. - MRI of the brain revealed 5 mm right posterior coronary radiata infarct, as well as previous multiple infarcts and deep gray matter changes.  -MRA head: Motion degraded but no large vessel occlusions. -Carotid Dopplers: Bilateral 1-39% ICA stenosis. -TTE 11/12: LVEF 60-65%. -LDL 62 and A1c 4.2. - Patient was on aspirin prior to admission.   -As per stroke MD sign off, suspect acute stroke likely due to A. fib not on anticoagulation and thereby recommended Coumadin with INR goal of 2-2.5 and to continue aspirin 325 mg daily bridge until INR at goal and then aspirin to be discontinued. - Monitor closely, considering prior history of lower GI bleed.   -DC to SNF pending completion of VVS input as below. -Outpatient follow-up with [facility] in 4 weeks.  Acute hypoxic hypercapnic respiratory failure -Likely from ESRD/pulmonary edema/volume overload and OSA.   -Pulmonary edema has resolved with hemodialysis.   -Resolved.  Saturating normally on room air.  Acute on chronic diastolic CHF/pulmonary artery hypertension -TTE 11/08/2018: LVEF 60-65%.  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  PA peak pressure 67 mmHg. -Volume management across HD.  Consider outpatient cardiology consultation and follow-up. -Palliative care input is highly appreciated.  Acute metabolic encephalopathy: -Likely multifactorial.   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 -I discussed in detail with patient's daughter on 11/18.  She spent a couple of hours with patient the day prior and indicates that his mental status changes have resolved and his mental status is back to baseline.  End-stage renal disease on hemodialysis -Ongoing HD as per nephrology guidance.  Patient is MWF HD and underwent HD on 11/18 as per schedule.  Stable.  Paroxysmal atrial fibrillation -As per neurology, has been off of Coumadin since 05/2011 due to lower GI bleed.  Was on aspirin prior to admission. -Given current embolic stroke, as per neurology, Coumadin started along with aspirin 325 mg daily bridge.  Aspirin to be stopped when INR therapeutic. -Outpatient follow-up with cardiology. -Rate controlled.  Diabetes mellitus type II - Recent A1c 4.2 - DC SSI and monitor CBGs. -Had episodes of hypoglycemia early on in admission, resolved.  Essential hypertension -Ongoing issues with intermittent soft blood pressures and beta-blockers on hold.  OSA - continue CPAP  Anemia of chronic disease/macrocytic anemia -Stable.  Monitor.  TSH and B12 normal.  Physical deconditioning -PT eval and recommend SNF at discharge.  Lytic lesions: Detailed evaluation done during recent hospitalization and as per documentation and DC summary 11/01/2018.  Plan as outlined in that DC summary and will need outpatient follow-up.  SPEP 10/30/2018: Unremarkable.  Evidence of monoclonal protein is not apparent.  History of lower GI bleed Rectal bleeding in June 2012 and required 4 units PRBCs and 1 unit FFP.  Colonoscopy did not show definite source but as per neurology note, perhaps it was diverticular bleeding.  No recent rectal bleeding.  Monitor closely while on anticoagulation.  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  ABI  shows bilateral moderate PAD.  As per VVS, plan angiogram with bilateral lower extremity runoff and possible left lower extremity intervention 11/19 and recommend holding Coumadin until intervention.  I discussed with Dr. Trula Slade, suspect that patient may not be ready for timely discharge today and DC may likely be for tomorrow.  Adult failure to thrive Multifactorial due to advanced age and multiple severe comorbidities.  Palliative care team input 11/14 appreciated.  They discussed with daughter and CODE STATUS was changed to DNR.  PMT continue to follow.   DVT prophylaxis: Subcutaneous heparin, Coumadin per pharmacy-held for vascular procedure 11/19. Code Status: DNR family Communication: I discussed in detail with patient's daughter on 11/18, updated care and answered questions.  None at bedside today. Disposition Plan: To SNF pending clinical improvement and vascular surgery intervention, possibly 11/20.Marland Kitchen  Consultants:  Nephrology PMT Neurology Vascular surgery  Procedures:  HD  Antimicrobials:  Cefepime and vancomycin from 11/06/2018-11/13   Subjective: Patient seen this morning prior to procedure.  Denies complaints.  No pain reported.  As per RN, no acute issues noted.  Objective: Vitals:   11/14/18 1718 11/14/18 1958 11/15/18 0500 11/15/18 0700  BP: (!) 101/51 (!) 91/44 (!) 93/50 126/60  Pulse: 85 84 (!) 59 85  Resp: 18 (!) 24 18 (!) 21  Temp: 98 F (36.7 C) 97.7 F (36.5 C) 97.9 F (36.6 C) 97.7 F (36.5 C)  TempSrc: Oral Oral Oral Oral  SpO2: 95% 100%  100%  Weight: 70.6 kg     Height:  Intake/Output Summary (Last 24 hours) at 11/15/2018 1155 Last data filed at 11/14/2018 1946 Gross per 24 hour  Intake -  Output 1030 ml  Net -1030 ml   Filed Weights   11/11/18 1210 11/14/18 1300 11/14/18 1718  Weight: 69.2 kg 71.6 kg 70.6 kg    Examination: No significant change in clinical exam compared to yesterday.  General exam: Pleasant elderly male,  moderately built and nourished lying comfortably propped up in bed. Respiratory system: Clear to auscultation.  No increased work of breathing. Cardiovascular system: S1-S2 heard, irregularly irregular.  No JVD or murmurs.  Telemetry personally reviewed: Atrial flutter with controlled ventricular rate. Gastrointestinal system: Abdomen is obese, soft and nontender. Normal bowel sounds heard.  Stable. Extremities: Left little toe with changes of dry gangrene.  Also has darkish discoloration of other toes of his left feet.  Absent bilateral dorsalis pedis and posterior tibial pulsations.  Left upper arm AV fistula. CNS: Alert and oriented x3.  No focal neurological deficits.   Data Reviewed: I have personally reviewed following labs and imaging studies  CBC: Recent Labs  Lab 11/09/18 0230 11/11/18 0814 11/14/18 0241  WBC 7.7 8.0 9.8  NEUTROABS 5.0  --  7.4  HGB 8.7* 8.8* 8.9*  HCT 28.0*  27.1* 29.1* 28.9*  MCV 103.7* 103.6* 101.8*  PLT 138* 151 161   Basic Metabolic Panel: Recent Labs  Lab 11/09/18 0230 11/11/18 0814 11/14/18 0241  NA 137 134* 135  K 4.1 4.1 5.0  CL 100 96* 97*  CO2 27 24 25   GLUCOSE 94 95 139*  BUN 39* 58* 72*  CREATININE 5.34* 6.55* 7.61*  CALCIUM 9.1 9.3 9.8  MG 2.2  --  2.4  PHOS  --  2.7 2.4*   GFR: Estimated Creatinine Clearance: 7.2 mL/min (A) (by C-G formula based on SCr of 7.61 mg/dL (H)). Liver Function Tests: Recent Labs  Lab 11/09/18 0230 11/11/18 0814 11/14/18 0241  AST 20  --   --   ALT 26  --   --   ALKPHOS 197*  --   --   BILITOT 0.8  --   --   PROT 6.9  --   --   ALBUMIN 2.8* 2.9* 3.1*    Recent Labs  Lab 11/09/18 0230  AMMONIA 42*   CBG: Recent Labs  Lab 11/14/18 0752 11/14/18 1150 11/14/18 2129 11/15/18 0821 11/15/18 1121  GLUCAP 106* 105* 124* 92 80   Anemia Panel: No results for input(s): VITAMINB12, FOLATE, FERRITIN, TIBC, IRON, RETICCTPCT in the last 72 hours.   Recent Results (from the past 240 hour(s))    Blood culture (routine x 2)     Status: None   Collection Time: 11/06/18  8:30 PM  Result Value Ref Range Status   Specimen Description BLOOD RIGHT ARM  Final   Special Requests   Final    BOTTLES DRAWN AEROBIC ONLY Blood Culture adequate volume   Culture   Final    NO GROWTH 5 DAYS Performed at [facility], [address], [place], [place] [address]    Report Status 11/11/2018 FINAL  Final  Blood culture (routine x 2)     Status: None   Collection Time: 11/06/18  8:50 PM  Result Value Ref Range Status   Specimen Description BLOOD RIGHT HAND  Final   Special Requests   Final    BOTTLES DRAWN AEROBIC AND ANAEROBIC Blood Culture adequate volume   Culture   Final    NO GROWTH  5 DAYS Performed at [facility], [place] [address], [place], [place] [address]    Report Status 11/11/2018 FINAL  Final         Radiology Studies: No results found.      Scheduled Meds: . aspirin EC  325 mg Oral Daily  . Chlorhexidine Gluconate Cloth  6 each Topical Q0600  . darbepoetin (ARANESP) injection - DIALYSIS  60 mcg Intravenous Q Wed-HD  . doxercalciferol  2 mcg Intravenous Q M,W,F-HD  . feeding supplement (ENSURE ENLIVE)  237 mL Oral BID BM  . feeding supplement (PRO-STAT SUGAR FREE 64)  30 mL Oral Daily  . heparin  5,000 Units Subcutaneous Q8H  . multivitamin  1 tablet Oral QHS   Continuous Infusions: . ferric gluconate (FERRLECIT/NULECIT) IV 62.5 mg (11/09/18 1058)     LOS: 9 days     Vernell Leep, MD, FACP, [facility]. [facility] Pager [phone]  If 7PM-7AM, please contact night-coverage [email or web address] Password [email or web address] 11/15/2018, 11:55 AM

## 2018-11-16 ENCOUNTER — Encounter (HOSPITAL_COMMUNITY): Payer: Self-pay | Admitting: Surgery

## 2018-11-16 DIAGNOSIS — I63411 Cerebral infarction due to embolism of right middle cerebral artery: Secondary | ICD-10-CM

## 2018-11-16 DIAGNOSIS — E8770 Fluid overload, unspecified: Secondary | ICD-10-CM

## 2018-11-16 LAB — GLUCOSE, CAPILLARY
GLUCOSE-CAPILLARY: 91 mg/dL (ref 70–99)
Glucose-Capillary: 123 mg/dL — ABNORMAL HIGH (ref 70–99)
Glucose-Capillary: 166 mg/dL — ABNORMAL HIGH (ref 70–99)
Glucose-Capillary: 106 mg/dL — ABNORMAL HIGH (ref 70–99)

## 2018-11-16 LAB — PREPARE RBC (CROSSMATCH)

## 2018-11-16 LAB — CBC
HCT: 21.5 % — ABNORMAL LOW (ref 39.0–52.0)
Hemoglobin: 6.7 g/dL — CL (ref 13.0–17.0)
MCH: 32.1 pg (ref 26.0–34.0)
MCHC: 31.2 g/dL (ref 30.0–36.0)
MCV: 102.9 fL — AB (ref 80.0–100.0)
Platelets: 135 10*3/uL — ABNORMAL LOW (ref 150–400)
RBC: 2.09 MIL/uL — AB (ref 4.22–5.81)
RDW: 16 % — ABNORMAL HIGH (ref 11.5–15.5)
WBC: 8.2 10*3/uL (ref 4.0–10.5)
nRBC: 0 % (ref 0.0–0.2)

## 2018-11-16 LAB — BASIC METABOLIC PANEL
Anion gap: 12 (ref 5–15)
BUN: 82 mg/dL — ABNORMAL HIGH (ref 8–23)
CO2: 26 mmol/L (ref 22–32)
Calcium: 9.3 mg/dL (ref 8.9–10.3)
Chloride: 97 mmol/L — ABNORMAL LOW (ref 98–111)
Creatinine, Ser: 7.67 mg/dL — ABNORMAL HIGH (ref 0.61–1.24)
GFR calc Af Amer: 7 mL/min — ABNORMAL LOW (ref >60)
GFR calc non Af Amer: 6 mL/min — ABNORMAL LOW (ref >60)
GLUCOSE: 107 mg/dL — AB (ref 70–99)
POTASSIUM: 5.6 mmol/L — AB (ref 3.5–5.1)
Sodium: 135 mmol/L (ref 135–145)

## 2018-11-16 LAB — PROTIME-INR
INR: 1.43
Prothrombin Time: 17.3 seconds — ABNORMAL HIGH (ref 11.4–15.2)

## 2018-11-16 MED ORDER — WARFARIN - PHARMACIST DOSING INPATIENT
Freq: Every day | Status: DC
  Administered 2018-11-16: 1

## 2018-11-16 MED ORDER — DARBEPOETIN ALFA 200 MCG/0.4ML IJ SOSY
PREFILLED_SYRINGE | INTRAMUSCULAR | Status: AC
  Filled 2018-11-16: qty 0.4

## 2018-11-16 MED ORDER — SODIUM CHLORIDE 0.9% IV SOLUTION: Freq: Once | INTRAVENOUS | Status: AC

## 2018-11-16 MED ORDER — DARBEPOETIN ALFA 100 MCG/0.5ML IJ SOSY
100.0000 ug | PREFILLED_SYRINGE | INTRAMUSCULAR | Status: DC
  Administered 2018-11-16: 100 ug via INTRAVENOUS

## 2018-11-16 MED ORDER — DOXERCALCIFEROL 4 MCG/2ML IV SOLN
INTRAVENOUS | Status: AC
  Filled 2018-11-16: qty 2

## 2018-11-16 MED ORDER — WARFARIN SODIUM 5 MG PO TABS
5.0000 mg | ORAL_TABLET | Freq: Once | ORAL | Status: AC
  Administered 2018-11-16: 5 mg via ORAL
  Filled 2018-11-16 (×2): qty 1

## 2018-11-16 MED ORDER — ACETAMINOPHEN 325 MG PO TABS
ORAL_TABLET | ORAL | Status: AC
  Filled 2018-11-16: qty 2

## 2018-11-16 MED ORDER — SODIUM CHLORIDE 0.9% IV SOLUTION: Freq: Once | INTRAVENOUS | Status: DC

## 2018-11-16 NOTE — Progress Notes (Addendum)
PROGRESS NOTE    [name]  IRJ:[id number] DOB: 03-03-1941 DOA: 11/06/2018 PCP: No primary care provider on file.    Brief Narrative:  77 year old male who presented with dyspnea and a near syncope episode.  He does have significant past medical history for paroxysmal atrial fibrillation, heart failure, type 2 diabetes mellitus, hypertension, dyslipidemia, OSA, and end-stage renal disease on hemodialysis (Monday/Wednesday/Friday).  Apparently patient had decreased mentation through the course of the day, associated with lightheadedness and dyspnea.  On the initial physical examination his blood pressure was 106/65, respiratory rate 25, heart rate 79, temperature 97.5, oxygen saturation 100% on 4 L/ min per nasal cannula.  His lungs had rales at the left base, heart S1-S2 present and rhythmic abdomen was soft nontender, no lower extremity edema.  Arterial blood gas pH 7.34, P CO2 15, PaO2 27, bicarb 27.9, oxygen saturation 97.5.  Sodium 137, potassium 4.1, chloride 97, bicarb 22, glucose 145, BUN 48, creatinine 6.3.  White count 9.8, hemoglobin 9.4, hct 30.7, platelets 173 chest x-ray had cardiomegaly, increase interstitial markings bilaterally, predominantly at the bases, with positive cephalization of the vasculature.  EKG atrial fibrillation, 71 bpm, normal axis, poor R wave progression.   Patient was admitted to the hospital with a working diagnosis of acute hypoxic respiratory failure due to volume overload.   Assessment & Plan:   Principal Problem:   Acute hypoxemic respiratory failure (HCC) Active Problems:   DM (diabetes mellitus) ([place])   OBSTRUCTIVE SLEEP APNEA   HYPERTENSION, BENIGN   PAF (paroxysmal atrial fibrillation) (HCC)   ESRD (end stage renal disease) (HCC)   Acute encephalopathy   Chronic anemia   Volume overload   Multifocal pneumonia   Acute metabolic encephalopathy   Physical deconditioning   Goals of care, counseling/discussion   Palliative care by  specialist   DNR (do not resuscitate) discussion   Cerebral thrombosis with cerebral infarction   1.  Acute hypoxic/hypercapnic respiratory failure. Improved volume status, plan for HD with ultrafiltration today, continue oxymetry monitoring and supplemental 02 per [place]. This am 02 sat 95 % on 2 LPM.   2.  Acute on chronic diastolic heart failure. Continue ultrafiltration, improved volume status but not back to baseline, will continue telemetry monitoring.   3.  Acute CVA complicated with acute metabolic encephalopathy. This am patient somnolent but easy to arouse, continue neuro checks per unit protocol, physical therapy, out of bed to chair. Patient will continue anticoagulation with warfarin, target INR 2 to 3, per neurology anticoagulation bridging with will aspirin  4.  End-stage renal disease on hemodialysis. Continue HD with ultrafiltration today, per nephrology recommendations.    5.  Paroxysmal atrial fibrillation. Continue rate control, anticoagulation with warfarin.   6.  Type 2 diabetes mellitus. Continue capillary glucose monitoring, this am 123 and 166. Well controlled.  7.  Hypertension. Continue blood pressure monitoring.   8.  Acute on chronic anemia (anemia of chronic disease). Hb down to 6,7 this am, will order 2 units PRBC to be given on HD. No signs of active bleeding, likely multifactorial.   9.  Peripheral vascular disease. With left fifth toe gangrene. SP Bilateral lower extremity runoff, with atherectomy with drug eluting stent, to the left superficial femoral popliteal artery and angioplasty to the left posterior tibial artery.    DVT prophylaxis: warfarin   Code Status: dnr Family Communication: no family at the bedside  Disposition Plan/ discharge barriers: pending clinical improvement   Body mass index is 25.12 kg/m. Malnutrition Type:  Nutrition Problem: Inadequate oral intake Etiology: acute illness   Malnutrition Characteristics:  Signs/Symptoms:  per patient/family report   Nutrition Interventions:  Interventions: Ensure Enlive (each supplement provides 350kcal and 20 grams of protein), Prostat  RN Pressure Injury Documentation:     Consultants:   Vascular surgery   Nephrology   Procedures:     Antimicrobials:       Subjective: Patient somnolent this am, easy to arouse, no nausea or vomiting, no chest pain or dyspnea.   Objective: Vitals:   11/15/18 2257 11/15/18 2259 11/15/18 2325 11/16/18 0447  BP: 99/61 99/61 99/61  117/73  Pulse: 89 88    Resp: (!) 25 (!) 23 20 16   Temp: 97.8 F (36.6 C)  (!) 97.4 F (36.3 C) 98 F (36.7 C)  TempSrc: Oral  Oral Oral  SpO2: 100% 100%    Weight:      Height:        Intake/Output Summary (Last 24 hours) at 11/16/2018 0739 Last data filed at 11/15/2018 1241 Gross per 24 hour  Intake -  Output 1 ml  Net -1 ml   Filed Weights   11/11/18 1210 11/14/18 1300 11/14/18 1718  Weight: 69.2 kg 71.6 kg 70.6 kg    Examination:   General: deconditioned Neurology: somnolent, non focal E ENT: positive pallor, no icterus, oral mucosa moist Cardiovascular: No JVD. S1-S2 present, rhythmic, no gallops, rubs, or murmurs. No lower extremity edema. Pulmonary: positive breath sounds bilaterally, adequate air movement, no wheezing, rhonchi or rales. Gastrointestinal. Abdomen with no organomegaly, non tender, no rebound or guarding Skin. No rashes Musculoskeletal: no joint deformities     Data Reviewed: I have personally reviewed following labs and imaging studies  CBC: Recent Labs  Lab 11/11/18 0814 11/14/18 0241 11/16/18 0149  WBC 8.0 9.8 8.2  NEUTROABS  --  7.4  --   HGB 8.8* 8.9* 6.7*  HCT 29.1* 28.9* 21.5*  MCV 103.6* 101.8* 102.9*  PLT 151 155 846*   Basic Metabolic Panel: Recent Labs  Lab 11/11/18 0814 11/14/18 0241 11/16/18 0149  NA 134* 135 135  K 4.1 5.0 5.6*  CL 96* 97* 97*  CO2 24 25 26   GLUCOSE 95 139* 107*  BUN 58* 72* 82*  CREATININE 6.55*  7.61* 7.67*  CALCIUM 9.3 9.8 9.3  MG  --  2.4  --   PHOS 2.7 2.4*  --    GFR: Estimated Creatinine Clearance: 7.2 mL/min (A) (by C-G formula based on SCr of 7.67 mg/dL (H)). Liver Function Tests: Recent Labs  Lab 11/11/18 0814 11/14/18 0241  ALBUMIN 2.9* 3.1*   No results for input(s): LIPASE, AMYLASE in the last 168 hours. No results for input(s): AMMONIA in the last 168 hours. Coagulation Profile: Recent Labs  Lab 11/14/18 0241 11/15/18 0248 11/16/18 0149  INR 1.28 1.37 1.43   Cardiac Enzymes: No results for input(s): CKTOTAL, CKMB, CKMBINDEX, TROPONINI in the last 168 hours. BNP (last 3 results) No results for input(s): PROBNP in the last 8760 hours. HbA1C: No results for input(s): HGBA1C in the last 72 hours. CBG: Recent Labs  Lab 11/14/18 1150 11/14/18 2129 11/15/18 0821 11/15/18 1121 11/15/18 2114  GLUCAP 105* 124* 92 80 89   Lipid Profile: No results for input(s): CHOL, HDL, LDLCALC, TRIG, CHOLHDL, LDLDIRECT in the last 72 hours. Thyroid Function Tests: No results for input(s): TSH, T4TOTAL, FREET4, T3FREE, THYROIDAB in the last 72 hours. Anemia Panel: No results for input(s): VITAMINB12, FOLATE, FERRITIN, TIBC, IRON, RETICCTPCT in the last 72  hours.    Radiology Studies: I have reviewed all of the imaging during this hospital visit personally     Scheduled Meds: . sodium chloride   Intravenous Once  . aspirin EC  81 mg Oral Daily  . Chlorhexidine Gluconate Cloth  6 each Topical Q0600  . Chlorhexidine Gluconate Cloth  6 each Topical Q0600  . darbepoetin (ARANESP) injection - DIALYSIS  60 mcg Intravenous Q Wed-HD  . doxercalciferol  2 mcg Intravenous Q M,W,F-HD  . feeding supplement (ENSURE ENLIVE)  237 mL Oral BID BM  . feeding supplement (PRO-STAT SUGAR FREE 64)  30 mL Oral Daily  . heparin  5,000 Units Subcutaneous Q8H  . multivitamin  1 tablet Oral QHS  . sodium chloride flush  3 mL Intravenous Q12H   Continuous Infusions: . sodium  chloride    . sodium chloride    . sodium chloride    . ferric gluconate (FERRLECIT/NULECIT) IV 62.5 mg (11/09/18 1058)     LOS: 10 days        Johntay Doolen Gerome Apley, MD [facility] Pager [phone]

## 2018-11-16 NOTE — Progress Notes (Addendum)
  Progress Note    11/16/2018 7:59 AM 1 Day Post-Op  Subjective:  Confused   Vitals:   11/16/18 0447 11/16/18 0736  BP: 117/73 (!) 153/70  Pulse:  88  Resp: 16 17  Temp: 98 F (36.7 C) 98.1 F (36.7 C)  SpO2:  100%   Physical Exam: Lungs:  Non labored on [place] Incisions:  R groin cath site soft without palpable hematoma Extremities:  Palpable L popliteal; L PT brisk by doppler; soft R PT by doppler; L 5th toe dry gangrene Abdomen:  Soft Neurologic: confused  CBC    Component Value Date/Time   WBC 8.2 11/16/2018 0149   RBC 2.09 (L) 11/16/2018 0149   HGB 6.7 (LL) 11/16/2018 0149   HCT 21.5 (L) 11/16/2018 0149   HCT 27.1 (L) 11/09/2018 0230   PLT 135 (L) 11/16/2018 0149   MCV 102.9 (H) 11/16/2018 0149   MCH 32.1 11/16/2018 0149   MCHC 31.2 11/16/2018 0149   RDW 16.0 (H) 11/16/2018 0149   LYMPHSABS 1.1 11/14/2018 0241   MONOABS 1.0 11/14/2018 0241   EOSABS 0.3 11/14/2018 0241   BASOSABS 0.1 11/14/2018 0241    BMET    Component Value Date/Time   NA 135 11/16/2018 0149   K 5.6 (H) 11/16/2018 0149   CL 97 (L) 11/16/2018 0149   CO2 26 11/16/2018 0149   GLUCOSE 107 (H) 11/16/2018 0149   BUN 82 (H) 11/16/2018 0149   CREATININE 7.67 (H) 11/16/2018 0149   CALCIUM 9.3 11/16/2018 0149   CALCIUM 8.3 (L) 10/25/2008 1450   GFRNONAA 6 (L) 11/16/2018 0149   GFRAA 7 (L) 11/16/2018 0149    INR    Component Value Date/Time   INR 1.43 11/16/2018 0149     Intake/Output Summary (Last 24 hours) at 11/16/2018 0759 Last data filed at 11/15/2018 1241 Gross per 24 hour  Intake -  Output 1 ml  Net -1 ml     Assessment/Plan:  77 y.o. male is s/p aortogram with L SFA-popliteal atherectomy and stenting with angioplasty of L PTA 1 Day Post-Op   Perfusing LLE well with easily palpable popliteal pulse and brisk PT signal by doppler Transfuse 2u RBCs per primary service Can restart anticoagulation when ok with primary service Dry gangrene L GT, no urgent indication for toe  amp Ok to discharge back to SNF when cleared medically   Dagoberto Ligas, PA-C [facility] [phone] 11/16/2018 7:59 AM  I have independently interviewed and examined patient and agree with PA assessment and plan above. Great PT signal. Ok for dc from vascular standpoint.  Cale Decarolis C. Donzetta Matters, MD [facility] [place] Office: [phone] Pager: [phone]

## 2018-11-16 NOTE — Progress Notes (Signed)
Physical Therapy Treatment Patient Details Name: [name] MRN: [id number] DOB: 09-Oct-1941 Today's Date: 11/16/2018    History of Present Illness 77yo male sent to the ED from his SNF due to AMS and BP 60/40. Head CT negative for acute changes. Diagnosed with sepsis. Pt underwent LLE angioplasty and stent placement on 11/15/18. PMH A-fib, CHF, CKD, DM, HTN, on HD x3/week     PT Comments    Pt making slow, steady progress. Continue to recommend return to SNF at DC.    Follow Up Recommendations  SNF;Supervision/Assistance - 24 hour     Equipment Recommendations  None recommended by PT    Recommendations for Other Services       Precautions / Restrictions Precautions Precautions: Fall;Other (comment) Precaution Comments: chronic wounds on buttocks and feet  Restrictions Weight Bearing Restrictions: No    Mobility  Bed Mobility Overal bed mobility: Needs Assistance Bed Mobility: Supine to Sit     Supine to sit: Min assist;HOB elevated     General bed mobility comments: Assist to elevate trunk into sitting by pulling up on therapist hand  Transfers Overall transfer level: Needs assistance Equipment used: Rolling walker (2 wheeled) Transfers: Sit to/from [facility] Sit to Stand: Min assist         General transfer comment: Assist for safety and balance.  Ambulation/Gait Ambulation/Gait assistance: Min assist Gait Distance (Feet): 75 Feet Assistive device: Rolling walker (2 wheeled) Gait Pattern/deviations: Step-through pattern;Decreased stride length Gait velocity: decreased Gait velocity interpretation: <1.31 ft/sec, indicative of household ambulator General Gait Details: amb on RA with SpO2 > 94%. After 52' pt stopped and propped forearms on walker to rest. Pt with slight buckling of knees. Returned to room via chair.   Stairs             Wheelchair Mobility    Modified Rankin (Stroke Patients Only)       Balance Overall  balance assessment: Needs assistance Sitting-balance support: Feet supported;No upper extremity supported Sitting balance-Leahy Scale: Fair     Standing balance support: During functional activity;Bilateral upper extremity supported Standing balance-Leahy Scale: Poor Standing balance comment: walker and min assist for static standing.                            Cognition Arousal/Alertness: Awake/alert Behavior During Therapy: Flat affect Overall Cognitive Status: No family/caregiver present to determine baseline cognitive functioning                   Orientation Level: Disoriented to;Situation   Memory: Decreased short-term memory   Safety/Judgement: Decreased awareness of safety;Decreased awareness of deficits   Problem Solving: Slow processing;Requires verbal cues        Exercises      General Comments        Pertinent Vitals/Pain Pain Assessment: Faces Faces Pain Scale: Hurts little more Pain Location: bilateral feet Pain Descriptors / Indicators: Sore Pain Intervention(s): Monitored during session;Repositioned    Home Living                      Prior Function            PT Goals (current goals can now be found in the care plan section) Acute Rehab PT Goals PT Goal Formulation: Patient unable to participate in goal setting Time For Goal Achievement: 11/21/18 Potential to Achieve Goals: Fair Progress towards PT goals: Progressing toward goals;Goals met and updated - see  care plan    Frequency    Min 2X/week      PT Plan Current plan remains appropriate    Co-evaluation              AM-PAC PT "6 Clicks" Daily Activity  Outcome Measure  Difficulty turning over in bed (including adjusting bedclothes, sheets and blankets)?: Unable Difficulty moving from lying on back to sitting on the side of the bed? : Unable Difficulty sitting down on and standing up from a chair with arms (e.g., wheelchair, bedside commode,  etc,.)?: Unable Help needed moving to and from a bed to chair (including a wheelchair)?: A Little Help needed walking in hospital room?: A Little Help needed climbing 3-5 steps with a railing? : A Lot 6 Click Score: 11    End of Session Equipment Utilized During Treatment: Gait belt Activity Tolerance: Patient tolerated treatment well Patient left: with call bell/phone within reach;in chair;with chair alarm set Nurse Communication: Mobility status PT Visit Diagnosis: Muscle weakness (generalized) (M62.81);Other abnormalities of gait and mobility (R26.89);Other symptoms and signs involving the nervous system (R29.898)     Time: 1010-1028 PT Time Calculation (min) (ACUTE ONLY): 18 min  Charges:  $Gait Training: 8-22 mins                     [place] Pager [phone] Office [place] 11/16/2018, 10:50 AM

## 2018-11-16 NOTE — Plan of Care (Signed)
  Problem: Clinical Measurements: Goal: Ability to maintain clinical measurements within normal limits will improve Outcome: Adequate for Discharge Goal: Will remain free from infection Outcome: Adequate for Discharge Goal: Diagnostic test results will improve Outcome: Adequate for Discharge Goal: Respiratory complications will improve Outcome: Adequate for Discharge Goal: Cardiovascular complication will be avoided Outcome: Adequate for Discharge   Problem: Safety: Goal: Ability to remain free from injury will improve Outcome: Adequate for Discharge   Problem: Skin Integrity: Goal: Risk for impaired skin integrity will decrease Outcome: Adequate for Discharge   Problem: Education: Goal: Knowledge of disease or condition will improve Outcome: Adequate for Discharge

## 2018-11-16 NOTE — Plan of Care (Signed)
  Problem: Education: Goal: Knowledge of disease or condition will improve 11/16/2018 0241 by Mikey Bussing, RN Outcome: Not Met (add Reason) Note:  Pt very forgetful.  11/16/2018 0208 by Mikey Bussing, RN Outcome: Not Progressing

## 2018-11-16 NOTE — Plan of Care (Signed)
  Problem: Clinical Measurements: Goal: Ability to maintain clinical measurements within normal limits will improve Outcome: Progressing Goal: Will remain free from infection Outcome: Progressing Goal: Diagnostic test results will improve Outcome: Progressing Goal: Respiratory complications will improve Outcome: Progressing Goal: Cardiovascular complication will be avoided Outcome: Progressing   Problem: Safety: Goal: Ability to remain free from injury will improve Outcome: Progressing   Problem: Skin Integrity: Goal: Risk for impaired skin integrity will decrease Outcome: Progressing   Problem: Education: Goal: Knowledge of disease or condition will improve Outcome: Progressing

## 2018-11-16 NOTE — Progress Notes (Signed)
CRITICAL VALUE ALERT  Critical Value:  Hgb: 6.7  Date & Time Notied:  11/16/2018 0240  Provider Notified: Provider on call notified. Awaiting new orders.

## 2018-11-16 NOTE — Progress Notes (Signed)
  [facility] Progress Note   Assessment/ Plan:   Physical Exam: Dialysis:[facility] MWF 4h 70kg LUA AVF 2/2.5 bath Hep 2500  - hect 2 ug tiw - mircera 100 ug due 11/11 - venofer 50 /wk q wed  Impression/ Plan: 1. SOB/hypoxemia - suspected pulm edema + vol overload. Resolved w HD, last CXR 11/14 clear. ECHO showed severe RV dysfunction and question AoV disease (poor study for that). LVEF 60%.  2.  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  Lives in a facility. Hx of dementia/ memory issues per primary discussions w/ family. Per primary team and also per neuro.   4. ESRD -MWF. HD on schedule 5. 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  Will need non calcium-based binder anyway with likely PAD 7. Anemia ckd - esa due this week, gave darbe 60 ug here 11/13, increase to 100 for today.  hgb 6.6, getting blood.   8. Parox afib- on Coumadin 9. OSA 10. Hx DM2 - not on any medication now 11. Dispo: pending to SNF   Subjective:    S/p angio with intervention by VVS.  Hgb down to 6.6, getting blood today.   Objective:   BP (!) 153/70 (BP Location: Right Arm)   Pulse 88   Temp 98.1 F (36.7 C) (Oral)   Resp 17   Ht 5\' 6"  (1.676 m)   Wt 70.6 kg   SpO2 100%   BMI 25.12 kg/m   Physical Exam: GEN NAD, lying in bed, sleeping and arousable HEENT sclerae anicteric NECK no JVD PULM clear anteriorly CV irregular, III/VI systolic murmur LUSB ABD obese, nontender EXT no LE edema, DP pulses poor bilaterally, LLE with gangrenous 5th digit, a little warmer.  R fem site without hematoma. NEURO alert to self and place ACCESS: LUE AVF + T/B  Labs: BMET Recent Labs  Lab 11/11/18 0814 11/14/18 0241 11/16/18 0149  NA 134* 135 135  K 4.1 5.0 5.6*  CL 96* 97* 97*  CO2 24 25 26   GLUCOSE 95 139* 107*   BUN 58* 72* 82*  CREATININE 6.55* 7.61* 7.67*  CALCIUM 9.3 9.8 9.3  PHOS 2.7 2.4*  --    CBC Recent Labs  Lab 11/11/18 0814 11/14/18 0241 11/16/18 0149  WBC 8.0 9.8 8.2  NEUTROABS  --  7.4  --   HGB 8.8* 8.9* 6.7*  HCT 29.1* 28.9* 21.5*  MCV 103.6* 101.8* 102.9*  PLT 151 155 135*    @IMGRELPRIORS @ Medications:    . sodium chloride   Intravenous Once  . aspirin EC  81 mg Oral Daily  . Chlorhexidine Gluconate Cloth  6 each Topical Q0600  . Chlorhexidine Gluconate Cloth  6 each Topical Q0600  . darbepoetin (ARANESP) injection - DIALYSIS  100 mcg Intravenous Q Wed-HD  . doxercalciferol  2 mcg Intravenous Q M,W,F-HD  . feeding supplement (ENSURE ENLIVE)  237 mL Oral BID BM  . feeding supplement (PRO-STAT SUGAR FREE 64)  30 mL Oral Daily  . heparin  5,000 Units Subcutaneous Q8H  . multivitamin  1 tablet Oral QHS  . sodium chloride flush  3 mL Intravenous Q12H     Madelon Lips, MD [facility] pgr [phone] 11/16/2018, 11:25 AM

## 2018-11-16 NOTE — Progress Notes (Signed)
[place] for warfarin Indication: atrial fibrillation / new CVA  Allergies  Allergen Reactions  . Tape Itching and Other (See Comments)    Cloth tape only    Patient Measurements: Height: 5\' 6"  (167.6 cm) Weight: 152 lb 8.9 oz (69.2 kg) IBW/kg (Calculated) : 63.8   Vital Signs: Temp: 98.4 F (36.9 C) (11/20 1200) Temp Source: Oral (11/20 1200) BP: 119/65 (11/20 1221) Pulse Rate: 85 (11/20 1221)  Labs: Recent Labs    11/14/18 0241 11/15/18 0248 11/16/18 0149  HGB 8.9*  --  6.7*  HCT 28.9*  --  21.5*  PLT 155  --  135*  LABPROT 15.9* 16.7* 17.3*  INR 1.28 1.37 1.43  CREATININE 7.61*  --  7.67*    Estimated Creatinine Clearance: 7.2 mL/min (A) (by C-G formula based on SCr of 7.67 mg/dL (H)).   Medical History: Past Medical History:  Diagnosis Date  . A-fib ([place])   . Anemia   . Blood transfusion   . BPH (benign prostatic hyperplasia)   . CHF (congestive heart failure) ([place])   . Diarrhea   . DM (diabetes mellitus) ([place])   . ESRD on hemodialysis ([place])    Started dialysis in 2009  . History of GI bleed    secondary to coumadin  . HTN (hypertension)   . Hyperlipidemia   . OSA (obstructive sleep apnea)    uses CPAP  . Secondary hyperparathyroidism of renal origin [facility])      Assessment: 77yom with multiple commorbities including ESRD,admitted for hypoxia, volume overload and MS change.   He has been in Afib HR in 100s but soft BP and not on rate control medications.  He has Hx of GIB and not on anticoagulation PTA.  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    Coumadin held for vascular procedures past few days, per VVS and primary team, okay to resume Coumadin tonight.  Goal of Therapy:  INR goal 2-2.5 Monitor platelets by anticoagulation protocol: Yes   Plan:  Coumadin 5 mg x 1 tonight Daily PT/INR.  [name], [facility] Clinical Pharmacist Phone [phone]  11/16/2018 12:54  PM

## 2018-11-16 NOTE — Clinical Social Work Note (Signed)
CSW continues to follow for discharge needs. Per RN, patient will discharge to SNF tomorrow if hemoglobin stable. SNF aware.  Dayton Scrape, [place]

## 2018-11-17 DIAGNOSIS — I6330 Cerebral infarction due to thrombosis of unspecified cerebral artery: Secondary | ICD-10-CM

## 2018-11-17 LAB — GLUCOSE, CAPILLARY: GLUCOSE-CAPILLARY: 99 mg/dL (ref 70–99)

## 2018-11-17 LAB — TYPE AND SCREEN
ABO/RH(D): O POS
Antibody Screen: NEGATIVE
Unit division: 0
Unit division: 0

## 2018-11-17 LAB — CBC WITH DIFFERENTIAL/PLATELET
Abs Immature Granulocytes: 0.13 10*3/uL — ABNORMAL HIGH (ref 0.00–0.07)
BASOS ABS: 0.1 10*3/uL (ref 0.0–0.1)
Basophils Relative: 1 %
EOS ABS: 0.7 10*3/uL — AB (ref 0.0–0.5)
Eosinophils Relative: 7 %
HCT: 27 % — ABNORMAL LOW (ref 39.0–52.0)
Hemoglobin: 8.7 g/dL — ABNORMAL LOW (ref 13.0–17.0)
Immature Granulocytes: 1 %
LYMPHS ABS: 1 10*3/uL (ref 0.7–4.0)
Lymphocytes Relative: 9 %
MCH: 31.1 pg (ref 26.0–34.0)
MCHC: 32.2 g/dL (ref 30.0–36.0)
MCV: 96.4 fL (ref 80.0–100.0)
Monocytes Absolute: 1.4 10*3/uL — ABNORMAL HIGH (ref 0.1–1.0)
Monocytes Relative: 13 %
NEUTROS ABS: 7.4 10*3/uL (ref 1.7–7.7)
Neutrophils Relative %: 69 %
Platelets: 133 10*3/uL — ABNORMAL LOW (ref 150–400)
RBC: 2.8 MIL/uL — ABNORMAL LOW (ref 4.22–5.81)
RDW: 17.2 % — AB (ref 11.5–15.5)
WBC: 10.7 10*3/uL — ABNORMAL HIGH (ref 4.0–10.5)
nRBC: 0.3 % — ABNORMAL HIGH (ref 0.0–0.2)

## 2018-11-17 LAB — BPAM RBC
Blood Product Expiration Date: 201912162359
Blood Product Expiration Date: 201912172359
ISSUE DATE / TIME: 201911201402
ISSUE DATE / TIME: 201911201402
Unit Type and Rh: 5100
Unit Type and Rh: 5100

## 2018-11-17 LAB — BASIC METABOLIC PANEL
Anion gap: 11 (ref 5–15)
BUN: 38 mg/dL — ABNORMAL HIGH (ref 8–23)
CALCIUM: 8.6 mg/dL — AB (ref 8.9–10.3)
CO2: 25 mmol/L (ref 22–32)
Chloride: 98 mmol/L (ref 98–111)
Creatinine, Ser: 4.78 mg/dL — ABNORMAL HIGH (ref 0.61–1.24)
GFR calc Af Amer: 12 mL/min — ABNORMAL LOW (ref >60)
GFR calc non Af Amer: 11 mL/min — ABNORMAL LOW (ref >60)
Glucose, Bld: 135 mg/dL — ABNORMAL HIGH (ref 70–99)
Potassium: 3.8 mmol/L (ref 3.5–5.1)
Sodium: 134 mmol/L — ABNORMAL LOW (ref 135–145)

## 2018-11-17 LAB — PROTIME-INR
INR: 1.38
Prothrombin Time: 16.8 seconds — ABNORMAL HIGH (ref 11.4–15.2)

## 2018-11-17 MED ORDER — PRO-STAT SUGAR FREE PO LIQD: 30.0000 mL | Freq: Every day | ORAL | 0 refills | Status: AC

## 2018-11-17 MED ORDER — RENA-VITE PO TABS: 1.0000 | ORAL_TABLET | Freq: Every day | ORAL | 0 refills | Status: AC

## 2018-11-17 MED ORDER — WARFARIN SODIUM 5 MG PO TABS: 5.0000 mg | ORAL_TABLET | Freq: Every day | ORAL | 0 refills | Status: DC

## 2018-11-17 MED ORDER — ASPIRIN 325 MG PO TABS: 325.0000 mg | ORAL_TABLET | Freq: Every day | ORAL | 0 refills | Status: DC

## 2018-11-17 MED ORDER — ATORVASTATIN CALCIUM 10 MG PO TABS: 20.0000 mg | ORAL_TABLET | Freq: Every day | ORAL | Status: DC

## 2018-11-17 MED ORDER — ENSURE ENLIVE PO LIQD: 237.0000 mL | Freq: Two times a day (BID) | ORAL | 0 refills | Status: DC

## 2018-11-17 MED ORDER — ASPIRIN 325 MG PO TABS
325.0000 mg | ORAL_TABLET | Freq: Every day | ORAL | Status: DC
  Administered 2018-11-17: 325 mg via ORAL
  Filled 2018-11-17: qty 1

## 2018-11-17 MED ORDER — WARFARIN SODIUM 5 MG PO TABS: 5.0000 mg | ORAL_TABLET | Freq: Every day | ORAL | Status: DC

## 2018-11-17 MED ORDER — ATORVASTATIN CALCIUM 20 MG PO TABS: 20.0000 mg | ORAL_TABLET | Freq: Every day | ORAL | 0 refills | Status: DC

## 2018-11-17 NOTE — Clinical Social Work Note (Addendum)
Updated FL2 with bipap settings. Admissions coordinator is working on Teacher, English as a foreign language.  Dayton Scrape, [place] [phone]  10:38 am SNF should have bipap machine by tonight. Left voicemail for daughter.  Dayton Scrape, [place]

## 2018-11-17 NOTE — Progress Notes (Signed)
Report called to [facility]. Packet given to [facility] for transport, daugther notified of patient heading back to [facility].

## 2018-11-17 NOTE — Plan of Care (Signed)
  Problem: Clinical Measurements: Goal: Ability to maintain clinical measurements within normal limits will improve Outcome: Adequate for Discharge Goal: Will remain free from infection Outcome: Adequate for Discharge Goal: Diagnostic test results will improve Outcome: Adequate for Discharge Goal: Respiratory complications will improve Outcome: Adequate for Discharge Goal: Cardiovascular complication will be avoided Outcome: Adequate for Discharge   Problem: Safety: Goal: Ability to remain free from injury will improve Outcome: Adequate for Discharge   Problem: Skin Integrity: Goal: Risk for impaired skin integrity will decrease Outcome: Adequate for Discharge   Problem: Education: Goal: Knowledge of disease or condition will improve Outcome: Adequate for Discharge Pt going back to [facility]

## 2018-11-17 NOTE — NC FL2 (Signed)
[place] LEVEL OF CARE SCREENING TOOL     IDENTIFICATION  Patient Name: [name] Birthdate: 21-Aug-1941 Sex: male Admission Date (Current Location): 11/06/2018  [facility] and [place] Number:  Herbalist and Address:  [facility], [place] [address], [place], [place] [address]      Provider Number: [id number]  Attending Physician Name and Address:  Tawni Millers,*  Relative Name and Phone Number:  [name], [phone]    Current Level of Care: Hospital Recommended Level of Care: [place] Prior Approval Number:    Date Approved/Denied:   PASRR Number: [id number]  Discharge Plan: SNF    Current Diagnoses: Patient Active Problem List   Diagnosis Date Noted  . 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 DNR (do not resuscitate) discussion   . Goals of care, counseling/discussion   . Palliative care by specialist   . Acute hypoxemic respiratory failure ([place]) 11/06/2018  . Volume overload 11/06/2018  . Multifocal pneumonia 11/06/2018  . Acute metabolic encephalopathy 64/40/3474  . Physical deconditioning 11/06/2018  . Acute respiratory failure with hypoxia ([place]) 11/04/2018  . CAP (community acquired pneumonia) 11/04/2018  . Pleural effusion, right 11/04/2018  . Increased ammonia level 11/04/2018  . Abnormal CT of the abdomen 11/04/2018  . Atrial fibrillation with RVR ([place]) 11/04/2018  . Sepsis ([place]) 10/21/2018  . Acute encephalopathy 10/21/2018  . Elevated alkaline phosphatase level 10/21/2018  . Chronic anemia 10/21/2018  . Thrombocytopenia ([place]) 10/21/2018  . Pulmonary HTN ([place]) 03/04/2017  . Aortic valve stenosis 03/04/2017  . Hypertension, accelerated with heart disease, without CHF 01/12/2017  . Dyspnea on exertion 01/12/2017  . ESRD (end stage renal disease) ([place]) 06/07/2012  . Hyperlipidemia 04/24/2011  . HYPERTENSION, BENIGN 10/24/2009  . DM (diabetes mellitus)  ([place]) 10/23/2009  . OBSTRUCTIVE SLEEP APNEA 10/23/2009  . PAF (paroxysmal atrial fibrillation) ([place]) 10/23/2009  . CHF (congestive heart failure) ([place]) 10/23/2009    Orientation RESPIRATION BLADDER Height & Weight     Self, Place  Normal, Other (Comment)(Bipap at night: 8-35/5 at 30%. Medium size face mask.) Continent Weight: 148 lb 2.4 oz (67.2 kg) Height:  5\' 6"  (167.6 cm)  BEHAVIORAL SYMPTOMS/MOOD NEUROLOGICAL BOWEL NUTRITION STATUS   None (None) Incontinent Diet(Regular)  AMBULATORY STATUS COMMUNICATION OF NEEDS Skin   Limited Assist Verbally Skin abrasions, Bruising, Other (Comment)(Excoriated, MASD, Skin tear. Diabetic ulcer on left toe: No dressing.)                       Personal Care Assistance Level of Assistance  Bathing, Feeding, Dressing Bathing Assistance: Limited assistance Feeding assistance: Independent Dressing Assistance: Limited assistance     Functional Limitations Info  Sight, Hearing, Speech Sight Info: Adequate Hearing Info: Adequate Speech Info: Adequate    SPECIAL CARE FACTORS FREQUENCY  Blood pressure, PT (By licensed PT)     PT Frequency: 5 x week             Contractures Contractures Info: Not present    Additional Factors Info  Code Status, Allergies Code Status Info: DNR Allergies Info: Tape   Insulin Sliding Scale Info: 3x daily with meals       Current Medications (11/17/2018):  This is the current hospital active medication list Current Facility-Administered Medications  Medication Dose Route Frequency Provider Last Rate Last Dose  . 0.9 %  sodium chloride infusion (Manually program via Guardrails IV Fluids)   Intravenous Once Arrien, Jimmy Picket, MD      .  0.9 %  sodium chloride infusion  100 mL Intravenous PRN Madelon Lips, MD      . 0.9 %  sodium chloride infusion  100 mL Intravenous PRN Madelon Lips, MD 999 mL/hr at 11/16/18 2013 100 mL at 11/16/18 2013  . 0.9 %  sodium chloride infusion  250 mL  Intravenous PRN [name], MD      . acetaminophen (TYLENOL) tablet 650 mg  650 mg Oral Q6H PRN Shela Leff, MD   650 mg at 11/16/18 1417  . aspirin tablet 325 mg  325 mg Oral Daily Arrien, Jimmy Picket, MD      . atorvastatin (LIPITOR) tablet 20 mg  20 mg Oral q1800 Arrien, Jimmy Picket, MD      . camphor-menthol [facility]) lotion   Topical PRN Bonnell Public, MD      . Chlorhexidine Gluconate Cloth 2 % PADS 6 each  6 each Topical Q0600 Roney Jaffe, MD   6 each at 11/17/18 [phone]  . Chlorhexidine Gluconate Cloth 2 % PADS 6 each  6 each Topical Q0600 Madelon Lips, MD      . Darbepoetin Alfa (ARANESP) injection 100 mcg  100 mcg Intravenous Q Wed-HD Madelon Lips, MD   100 mcg at 11/16/18 1444  . doxercalciferol (HECTOROL) injection 2 mcg  2 mcg Intravenous Q M,W,F-HD Roney Jaffe, MD   2 mcg at 11/16/18 1445  . feeding supplement (ENSURE ENLIVE) (ENSURE ENLIVE) liquid 237 mL  237 mL Oral BID BM Modena Jansky, MD   237 mL at 11/17/18 0843  . feeding supplement (PRO-STAT SUGAR FREE 64) liquid 30 mL  30 mL Oral Daily Modena Jansky, MD   30 mL at 11/17/18 0842  . ferric gluconate (NULECIT) 62.5 mg in sodium chloride 0.9 % 100 mL IVPB  62.5 mg Intravenous Q Wed-HD Madelon Lips, MD   Stopped at 11/16/18 1706  . heparin injection 1,000 Units  1,000 Units Dialysis PRN Madelon Lips, MD      . heparin injection 5,000 Units  5,000 Units Subcutaneous Q8H Shela Leff, MD   5,000 Units at 11/17/18 0355  . hydrALAZINE (APRESOLINE) injection 5 mg  5 mg Intravenous Q20 Min PRN [name], MD      . hydrOXYzine (ATARAX/VISTARIL) tablet 25 mg  25 mg Oral Q8H PRN Shela Leff, MD      . ipratropium-albuterol (DUONEB) 0.5-2.5 (3) MG/3ML nebulizer solution 3 mL  3 mL Nebulization Q4H PRN Bodenheimer, Charles A, NP   3 mL at 11/10/18 1514  . labetalol (NORMODYNE,TRANDATE) injection 10 mg  10 mg Intravenous Q10 min PRN [name], MD      .  lidocaine (PF) (XYLOCAINE) 1 % injection 5 mL  5 mL Intradermal PRN Madelon Lips, MD      . lidocaine-prilocaine (EMLA) cream 1 application  1 application Topical PRN Madelon Lips, MD      . multivitamin (RENA-VIT) tablet 1 tablet  1 tablet Oral QHS Modena Jansky, MD   1 tablet at 11/16/18 2134  . ondansetron (ZOFRAN) injection 4 mg  4 mg Intravenous Q6H PRN [name], MD      . pentafluoroprop-tetrafluoroeth Landry Dyke) aerosol 1 application  1 application Topical PRN Madelon Lips, MD      . sodium chloride flush (NS) 0.9 % injection 3 mL  3 mL Intravenous Q12H [name], MD   3 mL at 11/17/18 0844  . sodium chloride flush (NS) 0.9 % injection 3 mL  3  mL Intravenous PRN [name], MD      . warfarin (COUMADIN) tablet 5 mg  5 mg Oral q1800 Arrien, Jimmy Picket, MD      . Warfarin - Pharmacist Dosing Inpatient   Does not apply q1800 Pat Patrick, [facility]   1 each at 11/16/18 1706     Discharge Medications: Please see discharge summary for a list of discharge medications.  Relevant Imaging Results:  Relevant Lab Results:   Additional Information SS#: [id number]. HD MWF at [facility].  Candie Chroman, LCSW

## 2018-11-17 NOTE — Progress Notes (Signed)
Pt placed on Bipap for Hs per physician's orders.  Tol well.  Will cont to monitor and assess.

## 2018-11-17 NOTE — Progress Notes (Signed)
[facility] Progress Note   Assessment/ Plan:   Physical Exam: Dialysis:[facility] MWF 4h 70kg LUA AVF 2/2.5 bath Hep 2500  - hect 2 ug tiw - mircera 100 ug due 11/11 - venofer 50 /wk q wed  Impression/ Plan:   1.  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  Lives in a facility. Hx of dementia/ memory issues per primary discussions w/ family. Per primary team and also per neuro.   3. ESRD -MWF. HD on schedule, next planned tomorrow.  4. SOB/hypoxemia - suspected pulm edema + vol overload. Resolved w HD, last CXR 11/14 clear. ECHO showed severe RV dysfunction and question AoV disease (poor study for that). LVEF 60%. 5. 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  Will need non calcium-based binder anyway with likely PAD 7. Anemia ckd - esa due this week, gave darbe 60 ug here 11/13, increase to 100 for 11/20.  hgb 6.6, s/p pRBCs, Hgb 8.7 today. 8. Parox afib- on Coumadin 9. OSA 10. Hx DM2 - not on any medication now 11. Dispo: pending to SNF- possibly today   Subjective:    Seen in room.  Tolerated HD yesterday well.  Got pRBCs. Possible d/c today.  No complaints.     Objective:   BP 106/62 (BP Location: Right Arm)   Pulse 86   Temp 98.2 F (36.8 C) (Oral)   Resp 15   Ht 5\' 6"  (1.676 m)   Wt 67.2 kg   SpO2 93%   BMI 23.91 kg/m   Physical Exam: GEN NAD, lying in bed, sleeping and arousable HEENT sclerae anicteric NECK no JVD PULM clear anteriorly CV irregular, III/VI systolic murmur LUSB ABD obese, nontender EXT no LE edema, DP pulses poor bilaterally, LLE with gangrenous 5th digit, a little warmer.  R fem site without hematoma. NEURO alert to self and place ACCESS: LUE AVF + T/B, dressed  Labs: BMET Recent Labs  Lab 11/11/18 0814 11/14/18 0241 11/16/18 0149 11/17/18 0242   NA 134* 135 135 134*  K 4.1 5.0 5.6* 3.8  CL 96* 97* 97* 98  CO2 24 25 26 25   GLUCOSE 95 139* 107* 135*  BUN 58* 72* 82* 38*  CREATININE 6.55* 7.61* 7.67* 4.78*  CALCIUM 9.3 9.8 9.3 8.6*  PHOS 2.7 2.4*  --   --    CBC Recent Labs  Lab 11/11/18 0814 11/14/18 0241 11/16/18 0149 11/17/18 0242  WBC 8.0 9.8 8.2 10.7*  NEUTROABS  --  7.4  --  7.4  HGB 8.8* 8.9* 6.7* 8.7*  HCT 29.1* 28.9* 21.5* 27.0*  MCV 103.6* 101.8* 102.9* 96.4  PLT 151 155 135* 133*    @IMGRELPRIORS @ Medications:    . sodium chloride   Intravenous Once  . aspirin EC  81 mg Oral Daily  . Chlorhexidine Gluconate Cloth  6 each Topical Q0600  . Chlorhexidine Gluconate Cloth  6 each Topical Q0600  . darbepoetin (ARANESP) injection - DIALYSIS  100 mcg Intravenous Q Wed-HD  . doxercalciferol  2 mcg Intravenous Q M,W,F-HD  . feeding supplement (ENSURE ENLIVE)  237 mL Oral BID BM  . feeding supplement (PRO-STAT SUGAR FREE 64)  30 mL Oral Daily  . heparin  5,000 Units Subcutaneous Q8H  . multivitamin  1 tablet Oral QHS  . sodium  chloride flush  3 mL Intravenous Q12H  . Warfarin - Pharmacist Dosing Inpatient   Does not apply Randall, MD [facility] pgr [phone] 11/17/2018, 8:19 AM

## 2018-11-17 NOTE — Discharge Summary (Signed)
Physician Discharge Summary  [name] HWE:[id number] DOB: 07-02-1941 DOA: 11/06/2018  PCP: No primary care provider on file.  Admit date: 11/06/2018 Discharge date: 11/17/2018  Admitted From: SNF Disposition:  SNF  Recommendations for Outpatient Follow-up and new medication changes:  1. Follow up with Primary Care in 7 days.  2. Phoslo has been discontinued due to low p. 3. Patient placed on statin therapy. 4. Continue anticoagulation with warfarin, target INR 2 to 2.5 5. Follow INR on 11/21/2018.  6. Continue aspirin 325 mg until INR 2, then stop aspirin.  7. Follow with neurology [facility] in 4 weeks.   Home Health: no   Equipment/Devices: no    Discharge Condition: stable CODE STATUS: full  Diet recommendation: heart healthy.   Brief/Interim Summary: 77 year old male who presented with dyspnea and a near syncope episode.  He does have significant past medical history for paroxysmal atrial fibrillation, heart failure, type 2 diabetes mellitus, hypertension, dyslipidemia, OSA, and end-stage renal disease on hemodialysis (Monday/Wednesday/Friday).  Apparently patient had decreased mentation through the course of the day, associated with lightheadedness and dyspnea.  On the initial physical examination his blood pressure was 106/65, respiratory rate 25, heart rate 79, temperature 97.5, oxygen saturation 100% on 4 L/ min per nasal cannula.  His lungs had rales at the left base, heart S1-S2 present and rhythmic, abdomen was soft nontender, no lower extremity edema.  Arterial blood gas pH 7.34, P CO2 50, PaO2 27, bicarb 27.9, oxygen saturation 97.5 on supplemental 02).  Sodium 137, potassium 4.1, chloride 97, bicarb 22, glucose 145, BUN 48, creatinine 6.3.  White count 9.8, hemoglobin 9.4, hct 30.7, platelets 173 chest x-ray had cardiomegaly, increase interstitial markings bilaterally, predominantly at the bases, with positive cephalization of the vasculature.  EKG atrial fibrillation, 71 bpm,  normal axis, poor R wave progression.   Patient was admitted to the hospital with a working diagnosis of acute hypoxic/ hypercapnic respiratory failure due to volume overload.   1.  Acute hypoxic/hypercapnic respiratory failure due to volume overload.  Patient was admitted to the stepdown unit, he underwent hemodialysis with ultrafiltration with improvement of his volume status respirator failure.  Pressure at discharge 93% on room air.  Patient continue using BiPAP at night for his obstructive sleep apnea with good toleration.  2.  Acute CVA, 5 mm acute ischemic nonhemorrhagic small vessel type infarct involving the posterior right corona radiata, complicated by metabolic encephalopathy. Patient was noted to have persistent confusion while receiving hemodialysis November 13, further work-up with brain MRI showed acute CVA in the posterior right corona radiata.  Neurology was consulted.  Echocardiogram showed normal LV ejection fraction, carotid Dopplers with no significant stenosis, LDL 62.  Patient was placed back on warfarin, target INR 2-2.5 due to history of lower GI bleed, she will continue taking full dose aspirin 325 mg daily until INR becomes therapeutic.  Follow-up with neurology in 4 weeks.  Continue low-dose atorvastatin.  3.  Acute on chronic diastolic heart failure, complicated by pulmonary hypertension.  Patient received ultrafiltration with improvement of volume status, echocardiography showed preserved LV systolic function with elevated peak PA pressure.   4.  Paroxysmal atrial fibrillation.  It remained rate control, continue anticoagulation with warfarin, target INR 2-2.5, patient will take aspirin until INR therapeutic. Discharge INR 1.38.   5.  Type 2 diabetes mellitus/complicated by hypoglycemia.  And was placed on insulin sliding scale for glucose coverage and monitoring, capillary glucose over last 24 hours 89, 123, 166, 91, 106.  6.  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  Patient was seen by vascular surgery, he underwent lower extremity bilateral runoff, with atherectomy eluding stent to the left superficial femoral-popliteal artery, angioplasty to the left posterior tibial artery.   7.  Anemia of chronic kidney disease.  Patient received IV iron, darbepoetin alpha, his nadir hemoglobin reached 6.7 and required 2 units of packed red blood cells transfusion.  Continue outpatient follow-up.  8.  End-stage renal disease on hemodialysis.  Patient underwent hemodialysis with ultrafiltration on schedule, Mondays Wednesday Friday, plan for next hemodialysis November 22.  He regained euvolemia at discharge.  Discharge Diagnoses:  Principal Problem:   Acute hypoxemic respiratory failure (HCC) Active Problems:   DM (diabetes mellitus) ([place])   OBSTRUCTIVE SLEEP APNEA   HYPERTENSION, BENIGN   PAF (paroxysmal atrial fibrillation) (HCC)   ESRD (end stage renal disease) (HCC)   Acute encephalopathy   Chronic anemia   Volume overload   Multifocal pneumonia   Acute metabolic encephalopathy   Physical deconditioning   Goals of care, counseling/discussion   Palliative care by specialist   DNR (do not resuscitate) discussion   Cerebral thrombosis with cerebral infarction    Discharge Instructions  Discharge Instructions    Ambulatory referral to Neurology   Complete by:  As directed    Follow up with stroke clinic NP (Jessica Vanschaick or Cecille Rubin, if both not available, consider Zachery Dauer, or A[place]) at [facility] in about 4 weeks. Thanks.     Allergies as of 11/17/2018      Reactions   Tape Itching, Other (See Comments)   Cloth tape only      Medication List    STOP taking these medications   aspirin EC 325 MG tablet Replaced by:  aspirin 325 MG tablet   calcium acetate 667 MG capsule Commonly known as:  PHOSLO     TAKE these medications   acetaminophen 500 MG tablet Commonly known as:  TYLENOL Take 1,000 mg by  mouth 3 (three) times daily.   aspirin 325 MG tablet Take 1 tablet (325 mg total) by mouth daily for 15 days. Replaces:  aspirin EC 325 MG tablet   atorvastatin 20 MG tablet Commonly known as:  LIPITOR Take 1 tablet (20 mg total) by mouth daily at 6 PM.   feeding supplement (ENSURE ENLIVE) Liqd Take 237 mLs by mouth 2 (two) times daily between meals.   feeding supplement (PRO-STAT SUGAR FREE 64) Liqd Take 30 mLs by mouth daily.   hydrOXYzine 25 MG tablet Commonly known as:  ATARAX/VISTARIL Take 25 mg by mouth every 8 (eight) hours as needed for itching.   metoprolol tartrate 50 MG tablet Commonly known as:  LOPRESSOR Take 1 tablet (50 mg total) by mouth 2 (two) times daily.   multivitamin Tabs tablet Take 1 tablet by mouth at bedtime.   warfarin 5 MG tablet Commonly known as:  COUMADIN Take 1 tablet (5 mg total) by mouth daily at 6 PM.      Follow-up Information    [facility]. Schedule an appointment as soon as possible for a visit in 4 week(s).   Specialty:  Neurology Contact information: [place] [phone]         Allergies  Allergen Reactions  . Tape Itching and Other (See Comments)    Cloth tape only    Consultations:  Neurology  Nephrology   Vascular surgery    Procedures/Studies:  Ct Abdomen Pelvis Wo Contrast  Result Date: 10/27/2018 CLINICAL DATA:  n /v, pt denies abd pain history of chronic kidney disease. EXAM: CT ABDOMEN AND PELVIS WITHOUT CONTRAST TECHNIQUE: Multidetector CT imaging of the abdomen and pelvis was performed following the standard protocol without IV contrast. COMPARISON:  09/25/2008 FINDINGS: Lower chest: There is a RIGHT pleural effusion. Ground-glass opacities are identified in the central perihilar regions bilaterally, raising the question of pulmonary edema. There is dense coronary artery calcification. Heart size is normal. Hepatobiliary: Status post  cholecystectomy. Liver is homogeneous without focal mass appear Pancreas: Unremarkable. No pancreatic ductal dilatation or surrounding inflammatory changes. Spleen: Normal in size without focal abnormality. Adrenals/Urinary Tract: There is prominence of the adrenal glands bilaterally, not associated with discrete mass. The kidneys are small bilaterally. There is atherosclerotic calcification of the renal arteries. A low-attenuation lesion is identified in the LOWER pole the LEFT kidney, too small to fully characterize. There is no hydronephrosis. Ureters are unremarkable. The bladder and visualized portion of the urethra are normal. Stomach/Bowel: The stomach and small bowel loops are normal in appearance. The appendix is well seen and has a normal appearance. There are scattered colonic diverticula but no acute diverticulitis. Significant stool burden. Vascular/Lymphatic: There is dense atherosclerotic calcification of the abdominal aorta. No retroperitoneal or mesenteric adenopathy. Reproductive: The prostate is enlarged, up lifting the urinary bladder. Other: No free pelvic fluid. Anterior abdominal wall is notable for a small fat containing paraumbilical hernia. There is mild diffuse body wall edema. Musculoskeletal: There is a new 2.1 centimeter lytic lesion within the MEDIAL aspect of the femoral head, adjacent to the fovea. This lesion does not abut a weight-bearing articular surface and there are no significant degenerative changes in the hip. There is a 1.5 centimeter lytic lesion with than the superior aspect of L3. Lesions at the disc space of L3-4 and L4-5 are favored to represent Schmorl's nodes and are associated with degenerative changes. IMPRESSION: 1. RIGHT pleural effusion and ground-glass opacities in the lung bases consistent with pulmonary edema. 2. There are new osseous lytic lesions, 1 involving the RIGHT femoral head and a second involving the superior aspect of L3 vertebral body. These are  suspicious for metastatic disease given their appearance. Consider bone scan for further evaluation. 3. Small kidneys, consistent with the history of chronic renal disease. 4. Colonic diverticulosis without acute diverticulitis. Significant stool burden. 5. Cholecystectomy. 6.  Aortic atherosclerosis.  (ICD10-I70.0) 7. Prostatic enlargement. 8. Small fat containing paraumbilical hernia. 9. Mild anasarca. Electronically Signed   By: Nolon Nations M.D.   On: 10/27/2018 12:38   Dg Chest 1 View  Result Date: 10/22/2018 CLINICAL DATA:  77 year old male admitted with altered mental status, hypotension. In stage renal disease on dialysis. EXAM: CHEST  1 VIEW COMPARISON:  10/20/2018 and earlier. FINDINGS: AP view at 0734 hours. Mildly lower lung volumes. Stable cardiac size and mediastinal contours. Calcified aortic atherosclerosis. Visualized tracheal air column is within normal limits. Continued mild diffuse increased interstitial opacity. No pneumothorax, pleural effusion or confluent opacity. Negative visible bowel gas. IMPRESSION: Continued mild nonspecific bilateral pulmonary interstitial opacity, might reflect crowding of lung markings. Pulmonary interstitial edema and viral/atypical respiratory infection are difficult to exclude. Electronically Signed   By: Genevie Ann M.D.   On: 10/22/2018 09:28   Dg Chest 2 View  Result Date: 11/09/2018 CLINICAL DATA:  Shortness of breath EXAM: CHEST - 2 VIEW COMPARISON:  11/07/2018 FINDINGS: Mild cardiomegaly. Improved aeration and lung volumes. Improving bilateral airspace opacities  with mild residual interstitial opacities. No visible effusions. No acute bony abnormality. IMPRESSION: Improving aeration with increasing lung volumes and decreasing patchy bilateral airspace opacities. Continued interstitial opacities/thickening. Mild cardiomegaly. Electronically Signed   By: Rolm Baptise M.D.   On: 11/09/2018 08:50   Dg Chest 2 View  Result Date: 11/07/2018 CLINICAL  DATA:  Respiratory distress EXAM: CHEST - 2 VIEW COMPARISON:  11/06/2018, 10/28/2017, CT chest 10/23/2018, 06/16/2012 FINDINGS: Bilateral interstitial and ground-glass opacity. No significant pleural effusion. Cardiomegaly with aortic atherosclerosis. No pneumothorax. IMPRESSION: Cardiomegaly. Moderate diffuse bilateral interstitial and ground-glass opacity which may be secondary to pulmonary edema or diffuse pneumonia. Electronically Signed   By: Donavan Foil M.D.   On: 11/07/2018 19:49   Dg Chest 2 View  Result Date: 10/28/2018 CLINICAL DATA:  Shortness of breath and cough EXAM: CHEST - 2 VIEW COMPARISON:  October 22, 2018 chest radiograph and chest CT October 23, 2018 FINDINGS: There has been resolution of interstitial edema. The pleural effusion on the right seen on recent CT is not convincingly seen on this study. There is currently no appreciable edema or consolidation. There is mild cardiomegaly with pulmonary venous hypertension. There is aortic atherosclerosis. No adenopathy. No bone lesions. IMPRESSION: Pulmonary vascular congestion without frank edema or consolidation. There is aortic atherosclerosis. Aortic Atherosclerosis (ICD10-I70.0). Electronically Signed   By: Lowella Grip III M.D.   On: 10/28/2018 09:16   Ct Head Wo Contrast  Result Date: 10/20/2018 CLINICAL DATA:  Altered LOC EXAM: CT HEAD WITHOUT CONTRAST TECHNIQUE: Contiguous axial images were obtained from the base of the skull through the vertex without intravenous contrast. COMPARISON:  10/13/2018 FINDINGS: Brain: No acute hemorrhage or intracranial mass. 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 Old lacunar infarct left basal ganglia. Moderate-to-marked atrophy. Moderate small vessel ischemic changes of the white matter. Stable enlarged ventricles. Vascular: No hyperdense vessels.  Carotid vascular calcification Skull: Normal. Negative for fracture or focal lesion. Sinuses/Orbits: Mild mucosal thickening in the sinuses. No acute  orbital abnormality. Other: None IMPRESSION: 1. No CT evidence for acute intracranial abnormality. 2. Atrophy and small-vessel ischemic changes of the white matter. 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 Electronically Signed   By: Donavan Foil M.D.   On: 10/20/2018 18:49   Ct Chest Wo Contrast  Result Date: 10/23/2018 CLINICAL DATA:  Acute respiratory illness. EXAM: CT CHEST WITHOUT CONTRAST TECHNIQUE: Multidetector CT imaging of the chest was performed following the standard protocol without IV contrast. COMPARISON:  Chest radiograph, 10/22/2018 and older exams. FINDINGS: Cardiovascular: Heart is normal in size. There are dense three-vessel coronary artery calcifications. No pericardial effusion. Great vessels are normal in caliber. Aortic atherosclerosis. Mediastinum/Nodes: No neck base or axillary masses or enlarged lymph nodes. Scattered prominent mediastinal lymph nodes, none pathologically enlarged. No mediastinal or hilar masses. Trachea is patent. Esophagus is unremarkable. Lungs/Pleura: Moderate right pleural effusion. No left pleural effusion. Lungs show upper lobe predominant interstitial thickening with intervening ground-glass opacities. No lung mass or suspicious nodule. Several small calcified granuloma. No pneumothorax. Upper Abdomen: No acute finding. Musculoskeletal: No fracture or acute finding. No osteoblastic or osteolytic lesions. Chest wall: Prominent bilateral gynecomastia. IMPRESSION: 1. Lungs demonstrate upper lobe predominant interstitial thickening with intervening ground-glass opacities. Differential diagnosis includes pulmonary edema as well as inflammation or atypical infection. 2. Moderate right pleural effusion. 3. Dense coronary artery calcifications.  Aortic atherosclerosis. Aortic Atherosclerosis (ICD10-I70.0). Electronically Signed   By: Lajean Manes M.D.   On: 10/23/2018 17:23   Mr Jodene Nam Head Wo Contrast  Result Date: 11/13/2018  CLINICAL DATA:  Stroke follow-up.  EXAM: MRA HEAD WITHOUT CONTRAST TECHNIQUE: Angiographic images of the Circle of Willis were obtained using MRA technique without intravenous contrast. COMPARISON:  Head MRI 11/11/2018. No prior cerebral angiographic imaging. FINDINGS: The study is severely motion degraded despite repeated imaging attempts. The visualized distal vertebral arteries are patent to the basilar and codominant. The basilar artery is patent without evidence of flow limiting stenosis. A moderately large left posterior communicating artery is identified. The distal left P1 segment is poorly visualized which may reflect artifact, hypoplasia, or stenosis. The right P1 and P2 segments are patent without evidence of flow limiting stenosis. The intracranial internal carotid arteries are patent with extensive motion artifact limiting assessment of the cavernous and supraclinoid segments. Poor visualization of the right M1 segment is felt to be artifactual given a normal flow void on the recent MRI and presence of flow in proximal M2 vessels. The left M1 segment is patent with nondiagnostic assessment for stenosis due to artifact. The A1 and A2 segments are grossly patent bilaterally. No large aneurysm is identified. IMPRESSION: Severely motion degraded study as above. No definite large vessel occlusion. Electronically Signed   By: Logan Bores M.D.   On: 11/13/2018 11:17   Mr Brain Wo Contrast  Result Date: 11/11/2018 CLINICAL DATA:  Initial evaluation for acute encephalopathy. EXAM: MRI HEAD WITHOUT CONTRAST TECHNIQUE: Multiplanar, multiecho pulse sequences of the brain and surrounding structures were obtained without intravenous contrast. COMPARISON:  Prior CT from 10/20/2018. FINDINGS: Brain: Examination moderately degraded by motion artifact. Diffuse prominence of the CSF containing spaces compatible with generalized cerebral atrophy. Patchy and confluent T2/FLAIR hyperintensity within the periventricular and deep white matter both  cerebral hemispheres, consistent with chronic small vessel ischemic disease, moderate nature. Multiple superimposed remote lacunar infarcts within the bilateral basal ganglia and thalami. Associated chronic hemosiderin staining about several of these infarcts. Small remote cortical infarct at the left occipital lobe. 5 mm focus of diffusion abnormality at the posterior right corona radiata consistent with an acute ischemic small vessel type infarct (series 3, image 31). No associated hemorrhage or mass effect. No other evidence for acute or subacute ischemia. Gray-white matter differentiation otherwise maintained. No acute intracranial hemorrhage. Several scattered chronic micro hemorrhages seen clustered about the deep gray nuclei and brainstem, most like related to chronic underlying hypertension. No mass lesion, midline shift or mass effect. Diffuse ventricular prominence related global parenchymal volume loss of hydrocephalus. No extra-axial fluid collection. Pituitary gland grossly within normal limits. Vascular: Major intracranial vascular flow voids maintained. Skull and upper cervical spine: Craniocervical junction within normal limits. No focal marrow replacing lesion. Scalp soft tissues unremarkable. Sinuses/Orbits: Patient status post bilateral ocular lens replacement. Paranasal sinuses are largely clear. Bilateral mastoid effusions noted, right larger than left. Other: None. IMPRESSION: 1. 5 mm acute ischemic nonhemorrhagic small vessel type infarct involving the posterior right corona radiata. 2. Underlying moderate cerebral atrophy with chronic small vessel ischemic disease with multiple remote lacunar infarcts about the bilateral basal ganglia and thalami. 3. Additional remote left occipital cortical infarct. 4. Scattered chronic micro hemorrhages centered about the deep gray nuclei, likely due to chronic uncontrolled hypertension. Electronically Signed   By: Jeannine Boga M.D.   On: 11/11/2018  20:52   Mr Lumbar Spine Wo Contrast  Result Date: 10/30/2018 CLINICAL DATA:  Abnormal CT findings in lumbar spine. Negative bone scan. EXAM: MRI LUMBAR SPINE WITHOUT CONTRAST TECHNIQUE: Multiplanar, multisequence MR imaging of the lumbar spine was performed. No intravenous contrast was  administered. COMPARISON:  Bone scan 10/28/2018.  CT abdomen pelvis 10/27/2018 FINDINGS: The patient was not able to complete the study. Limited imaging was obtained which is significantly degraded by motion. Sagittal T2 and STIR images only were obtained. Normal alignment. No fracture. Multilevel disc degeneration. Review of the prior CT reveals lytic lesion superior L3 vertebral body which appears to communicate with the L2-3 disc space. Lytic lesion inferior endplate of L3 also communicates with the L3-4 disc space. Similar lesion inferior endplate of L4 communicates with the L4-5 disc space. 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 They are identified on the MRI however there is limited detail. IMPRESSION: Limited incomplete lumbar MRI. Extensive motion artifact. The patient was not complete the study. 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 Electronically Signed   By: Franchot Gallo M.D.   On: 10/30/2018 13:23   Nm Bone Scan Whole Body  Result Date: 10/28/2018 CLINICAL DATA:  New L3 and right femoral head lytic lesions. EXAM: NUCLEAR MEDICINE WHOLE BODY BONE SCAN TECHNIQUE: Whole body anterior and posterior images were obtained approximately 3 hours after intravenous injection of radiopharmaceutical. RADIOPHARMACEUTICALS:  20.3 mCi Technetium-74m MDP IV COMPARISON:  CT abdomen pelvis dated October 27, 2018. FINDINGS: There are no foci of increased or decreased radiotracer uptake to suggest osseous metastatic disease. There is degenerative type uptake in the bilateral shoulders. Normal physiologic activity is identified within the kidneys and urinary bladder.  IMPRESSION: Negative study. Specifically, no abnormal uptake in the lumbar spine or right femoral head. Electronically Signed   By: Titus Dubin M.D.   On: 10/28/2018 15:06   US Abdomen Complete  Result Date: 10/21/2018 CLINICAL DATA:  Abdominal pain EXAM: ABDOMEN ULTRASOUND COMPLETE COMPARISON:  None. FINDINGS: Gallbladder: Surgically absent Common bile duct: Diameter: 7 mm-upper normal for age. No evident filling defect Liver: No focal lesion identified. Within normal limits in parenchymal echogenicity. Portal vein is patent on color Doppler imaging with normal direction of blood flow towards the liver. IVC: No abnormality visualized. Pancreas: Visualized portion unremarkable. Spleen: Size and appearance within normal limits. Right Kidney: Known end-stage renal disease. Length: 8 cm. Increased echogenicity. No hydronephrosis or mass Left Kidney: Length: 7 cm. There is increased echogenicity. No hydronephrosis or mass. Abdominal aorta: No aneurysm visualized. The distal aorta is not visible due to bowel gas. Other findings: A right pleural effusion is partially visualized. IMPRESSION: 1. No acute intra-abdominal finding. 2. Right pleural effusion. Electronically Signed   By: Monte Fantasia M.D.   On: 10/21/2018 18:15   Dg Chest Portable 1 View  Result Date: 11/06/2018 CLINICAL DATA:  Syncope. EXAM: PORTABLE CHEST 1 VIEW COMPARISON:  October 28, 2018 FINDINGS: The heart size remains mildly enlarged. No pneumothorax. No nodules or masses. Patchy opacities in the lungs, particularly the right base. No other acute abnormalities. IMPRESSION: Patchy opacities in the lungs may represent multifocal pneumonia versus asymmetric edema. Recommend clinical correlation. Electronically Signed   By: Dorise Bullion III M.D   On: 11/06/2018 20:00   Dg Chest Port 1 View  Result Date: 10/20/2018 CLINICAL DATA:  Altered mental status and hypotension. EXAM: PORTABLE CHEST 1 VIEW COMPARISON:  10/13/2018 and prior  radiographs FINDINGS: UPPER limits normal heart size noted. Mild interstitial prominence again noted. No airspace disease, pleural effusion or pneumothorax. No acute bony abnormality identified. IMPRESSION: UPPER limits normal heart size with unchanged interstitial prominence. Electronically Signed   By: Cleatis Polka.D.  On: 10/20/2018 16:42   Dg Toe 5th Left  Result Date: 10/24/2018 CLINICAL DATA:  Open wound on toe.  Diabetic. EXAM: DG TOE 5TH LEFT COMPARISON:  None. FINDINGS: Mild irregularity of the base of the fifth proximal phalanx. No bone destruction to suggest osteomyelitis. No definite fracture identified. No air within the soft tissues. IMPRESSION: No evidence of fracture or osteomyelitis. Electronically Signed   By: Marin Olp M.D.   On: 10/24/2018 19:25   Vas [place] Abi With/wo Tbi  Result Date: 11/13/2018 LOWER EXTREMITY DOPPLER STUDY Indications: Peripheral artery disease.  Limitations: patient movement Performing Technologist: Abram Sander RVS  Examination Guidelines: A complete evaluation includes at minimum, Doppler waveform signals and systolic blood pressure reading at the level of bilateral brachial, anterior tibial, and posterior tibial arteries, when vessel segments are accessible. Bilateral testing is considered an integral part of a complete examination. Photoelectric Plethysmograph (PPG) waveforms and toe systolic pressure readings are included as required and additional duplex testing as needed. Limited examinations for reoccurring indications may be performed as noted.  ABI Findings: +--------+------------------+-----+----------+--------+ Right   Rt Pressure (mmHg)IndexWaveform  Comment  +--------+------------------+-----+----------+--------+ [id number]                    triphasic          +--------+------------------+-----+----------+--------+ PTA     69                0.63 monophasic         +--------+------------------+-----+----------+--------+ DP       62                0.57 monophasic         +--------+------------------+-----+----------+--------+ +--------+------------------+-----+----------+-------------------------------+ Left    Lt Pressure (mmHg)IndexWaveform  Comment                         +--------+------------------+-----+----------+-------------------------------+ 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 +--------+------------------+-----+----------+-------------------------------+ PTA     63                0.58 monophasic                                +--------+------------------+-----+----------+-------------------------------+ DP                                       not audible                     +--------+------------------+-----+----------+-------------------------------+ +-------+-----------+-----------+------------+------------+ ABI/TBIToday's ABIToday's TBIPrevious ABIPrevious TBI +-------+-----------+-----------+------------+------------+ Right  0.63                                           +-------+-----------+-----------+------------+------------+ Left   0.58                                           +-------+-----------+-----------+------------+------------+  Summary: Right: Resting right ankle-brachial index indicates moderate right lower extremity arterial disease. Left: Resting left  ankle-brachial index indicates moderate left lower extremity arterial disease.  *See table(s) above for measurements and observations.  Electronically signed by Ruta Hinds MD on 11/13/2018 at 9:21:47 AM.   Final    Ir US Chest  Result Date: 10/26/2018 CLINICAL DATA:  77 year old male with a history of possible pleural fluid. EXAM: CHEST ULTRASOUND COMPARISON:  None. FINDINGS: Sonographic survey of the chest demonstrates no significant fluid. Thoracentesis not performed. IMPRESSION: Sonographic survey demonstrates no significant right pleural fluid. Electronically Signed    By: Corrie Mckusick D.O.   On: 10/26/2018 11:14   Vas US Carotid  Result Date: 11/14/2018 Carotid Arterial Duplex Study Indications:       CVA, Weakness and Diplopia. Risk Factors:      Hypertension, hyperlipidemia, Diabetes, current smoker. Other Factors:     ESRD. Limitations:       Constant movement, positioning, confusion Comparison Study:  Prior study done 12/16/2010 is available for comparison. No                    significant change noted. Performing Technologist: Sharion Dove RVS  Examination Guidelines: A complete evaluation includes B-mode imaging, spectral Doppler, color Doppler, and power Doppler as needed of all accessible portions of each vessel. Bilateral testing is considered an integral part of a complete examination. Limited examinations for reoccurring indications may be performed as noted.  Right Carotid Findings: +----------+--------+--------+--------+------------+------------------+           PSV cm/sEDV cm/sStenosisDescribe    Comments           +----------+--------+--------+--------+------------+------------------+ CCA Prox  53      10                          intimal thickening +----------+--------+--------+--------+------------+------------------+ CCA Distal58      11                          intimal thickening +----------+--------+--------+--------+------------+------------------+ ICA Prox  45      12              heterogenous                   +----------+--------+--------+--------+------------+------------------+ ICA Distal42      15                                             +----------+--------+--------+--------+------------+------------------+ ECA       70      15                                             +----------+--------+--------+--------+------------+------------------+ +----------+--------+-------+--------+-------------------+           PSV cm/sEDV cmsDescribeArm Pressure (mmHG)  +----------+--------+-------+--------+-------------------+ [id number]                                         +----------+--------+-------+--------+-------------------+ +---------+--------+--+--------+-+ VertebralPSV cm/s41EDV cm/s8 +---------+--------+--+--------+-+  Left Carotid Findings: +----------+--------+--------+--------+------------+------------------+           PSV cm/sEDV cm/sStenosisDescribe    Comments           +----------+--------+--------+--------+------------+------------------+ CCA Prox  84  8                           intimal thickening +----------+--------+--------+--------+------------+------------------+ CCA Distal60      7                           intimal thickening +----------+--------+--------+--------+------------+------------------+ ICA Prox  79      16              heterogenous                   +----------+--------+--------+--------+------------+------------------+ ICA Distal75      15                                             +----------+--------+--------+--------+------------+------------------+ +----------+--------+--------+--------+-------------------+ SubclavianPSV cm/sEDV cm/sDescribeArm Pressure (mmHG) +----------+--------+--------+--------+-------------------+           75                                          +----------+--------+--------+--------+-------------------+ +---------+--------+--+--------+-+ VertebralPSV cm/s43EDV cm/s8 +---------+--------+--+--------+-+  Summary: Right Carotid: Velocities in the right ICA are consistent with a 1-39% stenosis. Left Carotid: Velocities in the left ICA are consistent with a 1-39% stenosis. Vertebrals:  Bilateral vertebral arteries demonstrate antegrade flow. Subclavians: Normal flow hemodynamics were seen in bilateral subclavian              arteries. *See table(s) above for measurements and observations.  Electronically signed by Antony Contras MD on 11/14/2018  at 1:08:43 PM.    Final        Subjective: Patient is feeling better, no nausea or vomiting, no chest pain or dyspnea. No leg pain.   Discharge Exam: Vitals:   11/17/18 0006 11/17/18 0321  BP:  106/62  Pulse: 89 86  Resp: (!) 21 15  Temp:  98.2 F (36.8 C)  SpO2: 100% 93%   Vitals:   11/16/18 1947 11/17/18 0003 11/17/18 0006 11/17/18 0321  BP: (!) 86/48 (!) 112/93  106/62  Pulse: 93 86 89 86  Resp: 18 (!) 21 (!) 21 15  Temp: 98.8 F (37.1 C) 98.4 F (36.9 C)  98.2 F (36.8 C)  TempSrc: Oral Oral  Oral  SpO2: 100% 96% 100% 93%  Weight:      Height:        General: Not in pain or dyspnea Neurology: Awake and alert, non focal  E ENT: mild pallor, no icterus, oral mucosa moist Cardiovascular: No JVD. S1-S2 present, rhythmic, no gallops or rubs, positive murmur at the right upper sternal border, systolic, 3/6. No lower extremity edema. Pulmonary: positive breath sounds bilaterally, adequate air movement, no wheezing, rhonchi or rales. Gastrointestinal. Abdomen with no organomegaly, non tender, no rebound or guarding Skin. No rashes Musculoskeletal: no joint deformities   The results of significant diagnostics from this hospitalization (including imaging, microbiology, ancillary and laboratory) are listed below for reference.     Microbiology: No results found for this or any previous visit (from the past 240 hour(s)).   Labs: BNP (last 3 results) Recent Labs    11/06/18 1905  BNP 4,010.2*   Basic Metabolic Panel: Recent Labs  Lab 11/11/18 0814 11/14/18 0241 11/16/18 0149 11/17/18 0242  NA  134* 135 135 134*  K 4.1 5.0 5.6* 3.8  CL 96* 97* 97* 98  CO2 24 25 26 25   GLUCOSE 95 139* 107* 135*  BUN 58* 72* 82* 38*  CREATININE 6.55* 7.61* 7.67* 4.78*  CALCIUM 9.3 9.8 9.3 8.6*  MG  --  2.4  --   --   PHOS 2.7 2.4*  --   --    Liver Function Tests: Recent Labs  Lab 11/11/18 0814 11/14/18 0241  ALBUMIN 2.9* 3.1*   No results for input(s): LIPASE,  AMYLASE in the last 168 hours. No results for input(s): AMMONIA in the last 168 hours. CBC: Recent Labs  Lab 11/11/18 0814 11/14/18 0241 11/16/18 0149 11/17/18 0242  WBC 8.0 9.8 8.2 10.7*  NEUTROABS  --  7.4  --  7.4  HGB 8.8* 8.9* 6.7* 8.7*  HCT 29.1* 28.9* 21.5* 27.0*  MCV 103.6* 101.8* 102.9* 96.4  PLT 151 155 135* 133*   Cardiac Enzymes: No results for input(s): CKTOTAL, CKMB, CKMBINDEX, TROPONINI in the last 168 hours. BNP: Invalid input(s): POCBNP CBG: Recent Labs  Lab 11/15/18 2114 11/16/18 0814 11/16/18 1125 11/16/18 1715 11/16/18 2133  GLUCAP 89 123* 166* 91 106*   D-Dimer No results for input(s): DDIMER in the last 72 hours. Hgb A1c No results for input(s): HGBA1C in the last 72 hours. Lipid Profile No results for input(s): CHOL, HDL, LDLCALC, TRIG, CHOLHDL, LDLDIRECT in the last 72 hours. Thyroid function studies No results for input(s): TSH, T4TOTAL, T3FREE, THYROIDAB in the last 72 hours.  Invalid input(s): FREET3 Anemia work up No results for input(s): VITAMINB12, FOLATE, FERRITIN, TIBC, IRON, RETICCTPCT in the last 72 hours. Urinalysis    Component Value Date/Time   COLORURINE YELLOW 10/20/2008 0942   APPEARANCEUR CLEAR 10/20/2008 0942   LABSPEC 1.011 10/20/2008 0942   PHURINE 6.0 10/20/2008 0942   GLUCOSEU NEGATIVE 10/20/2008 0942   HGBUR LARGE (A) 10/20/2008 0942   BILIRUBINUR NEGATIVE 10/20/2008 0942   KETONESUR NEGATIVE 10/20/2008 0942   PROTEINUR 100 (A) 10/20/2008 0942   UROBILINOGEN 1.0 10/20/2008 0942   NITRITE NEGATIVE 10/20/2008 0942   LEUKOCYTESUR SMALL (A) 10/20/2008 0942   Sepsis Labs Invalid input(s): PROCALCITONIN,  WBC,  LACTICIDVEN Microbiology No results found for this or any previous visit (from the past 240 hour(s)).   Time coordinating discharge: 45 minutes  SIGNED:   Tawni Millers, MD  [facility] 11/17/2018, 8:02 AM Pager [phone]  If 7PM-7AM, please contact  night-coverage [email or web address] Password [email or web address]

## 2018-11-17 NOTE — Clinical Social Work Note (Signed)
CSW facilitated patient discharge including contacting patient family (daughter called back) and facility to confirm patient discharge plans. Clinical information faxed to facility and family agreeable with plan. CSW arranged ambulance transport via [facility] to [facility]. RN to call report prior to discharge [phone]).  CSW will sign off for now as social work intervention is no longer needed. Please consult [place] again if new needs arise.  Dayton Scrape, [place]

## 2018-11-17 NOTE — Progress Notes (Signed)
Palliative: [name] is resting quietly in bed.  He greets me making but not keeping eye contact.  He is calm and cooperative, but continues to be confused as to month, he states we are [facility] long hospital.  There is no family at bedside at this time.  We talked about disposition.  He tells me that he will not discharge to rehab and then to [facility] water [facility].  We talked about his daughter, [name], and her ability to make good choices for him.  [name] has no complaints or concerns at this time,  We talked about his acute and chronic health problems.  At this point, [name] does not understand fully what has happened with his vascular problems or his stroke.  Conference with nursing staff related to goals of care meeting, patient needs, disposition.  Call to daughter, [name] at [phone].  No answer, unable to leave voicemail message.  105 minutes [name], NP Palliative medicine team [phone]

## 2018-11-17 NOTE — Progress Notes (Signed)
[place] for warfarin Indication: atrial fibrillation / new CVA  Allergies  Allergen Reactions  . Tape Itching and Other (See Comments)    Cloth tape only    Patient Measurements: Height: 5\' 6"  (167.6 cm) Weight: 148 lb 2.4 oz (67.2 kg) IBW/kg (Calculated) : 63.8   Vital Signs: Temp: 98.2 F (36.8 C) (11/21 0321) Temp Source: Oral (11/21 0321) BP: 106/62 (11/21 0321) Pulse Rate: 86 (11/21 0321)  Labs: Recent Labs    11/15/18 0248 11/16/18 0149 11/17/18 0242  HGB  --  6.7* 8.7*  HCT  --  21.5* 27.0*  PLT  --  135* 133*  LABPROT 16.7* 17.3* 16.8*  INR 1.37 1.43 1.38  CREATININE  --  7.67* 4.78*    Estimated Creatinine Clearance: 11.5 mL/min (A) (by C-G formula based on SCr of 4.78 mg/dL (H)).   Medical History: Past Medical History:  Diagnosis Date  . A-fib ([place])   . Anemia   . Blood transfusion   . BPH (benign prostatic hyperplasia)   . CHF (congestive heart failure) ([place])   . Diarrhea   . DM (diabetes mellitus) ([place])   . ESRD on hemodialysis ([place])    Started dialysis in 2009  . History of GI bleed    secondary to coumadin  . HTN (hypertension)   . Hyperlipidemia   . OSA (obstructive sleep apnea)    uses CPAP  . Secondary hyperparathyroidism of renal origin [facility])      Assessment: 77yom with multiple commorbities including ESRD,admitted for hypoxia, volume overload and MS change.   He has been in Afib HR in 100s but soft BP and not on rate control medications.  He has Hx of GIB and not on anticoagulation PTA. 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    Coumadin held for vascular procedures past few days, per [facility] and primary team, okay to resume Coumadin 11/20. INR subtherapeutic this morning at 1.38 (received 5 mg warfarin last night). Hgb 8.7 (after 2 unit PRBC transfusion yesterday), platelets stable at 133. No documented bleeding.   Goal of Therapy:  INR goal 2-2.5 Monitor platelets by anticoagulation  protocol: Yes   Plan:  Coumadin 5 mg x 1 tonight Daily PT/INR   Brendolyn Patty, PharmD PGY1 Pharmacy Resident Phone [phone]  11/17/2018   7:33 AM

## 2018-11-17 NOTE — Discharge Instructions (Signed)

## 2018-11-18 ENCOUNTER — Telehealth: Payer: Self-pay | Admitting: Vascular Surgery

## 2018-11-18 ENCOUNTER — Non-Acute Institutional Stay (SKILLED_NURSING_FACILITY): Payer: Medicare Other | Admitting: Internal Medicine

## 2018-11-18 ENCOUNTER — Encounter: Payer: Self-pay | Admitting: Internal Medicine

## 2018-11-18 DIAGNOSIS — J9601 Acute respiratory failure with hypoxia: Secondary | ICD-10-CM | POA: Diagnosis not present

## 2018-11-18 DIAGNOSIS — G9341 Metabolic encephalopathy: Secondary | ICD-10-CM | POA: Diagnosis not present

## 2018-11-18 DIAGNOSIS — I480 Paroxysmal atrial fibrillation: Secondary | ICD-10-CM

## 2018-11-18 DIAGNOSIS — D649 Anemia, unspecified: Secondary | ICD-10-CM

## 2018-11-18 DIAGNOSIS — I96 Gangrene, not elsewhere classified: Secondary | ICD-10-CM

## 2018-11-18 DIAGNOSIS — E1122 Type 2 diabetes mellitus with diabetic chronic kidney disease: Secondary | ICD-10-CM

## 2018-11-18 DIAGNOSIS — E162 Hypoglycemia, unspecified: Secondary | ICD-10-CM

## 2018-11-18 DIAGNOSIS — J9602 Acute respiratory failure with hypercapnia: Secondary | ICD-10-CM

## 2018-11-18 DIAGNOSIS — N186 End stage renal disease: Secondary | ICD-10-CM

## 2018-11-18 DIAGNOSIS — I119 Hypertensive heart disease without heart failure: Secondary | ICD-10-CM

## 2018-11-18 DIAGNOSIS — I639 Cerebral infarction, unspecified: Secondary | ICD-10-CM | POA: Diagnosis not present

## 2018-11-18 DIAGNOSIS — Z992 Dependence on renal dialysis: Secondary | ICD-10-CM

## 2018-11-18 DIAGNOSIS — I5033 Acute on chronic diastolic (congestive) heart failure: Secondary | ICD-10-CM

## 2018-11-18 DIAGNOSIS — E1351 Other specified diabetes mellitus with diabetic peripheral angiopathy without gangrene: Secondary | ICD-10-CM

## 2018-11-18 DIAGNOSIS — E7849 Other hyperlipidemia: Secondary | ICD-10-CM

## 2018-11-18 NOTE — Telephone Encounter (Signed)
sch appt [facility] 12/12/18 12ABI 1pm LE Art 115pm p/o NP

## 2018-11-18 NOTE — Telephone Encounter (Signed)
-----   Message from Mena Goes, RN sent at 11/17/2018 12:55 PM EST ----- Regarding: 3-4 weeks with NP and ABIs   ----- Message ----- From: Iline Oven Sent: 11/17/2018  12:30 PM EST To: [facility] Admin Pool, [facility] Charge Pool  Can you schedule an appt with NP in about 3-4 weeks and ABIs.  PO SFA atherectomy. Thanks, [facility]

## 2018-11-18 NOTE — Progress Notes (Signed)
:    Location:  [facility] Room Number: 100P Place of Service:  SNF (31)  [name] Coil, MD  Patient Care Team: [name], MD as Consulting Physician (Nephrology)  Extended Emergency Contact Information Primary Emergency Contact: [name],[name] Address: [place], [place] [address] [name] of [place] Phone: [phone] Work Phone: [phone] Mobile Phone: [phone] Relation: Daughter     Allergies: Tape  Chief Complaint  Patient presents with  . New Admit To SNF    Admit to [facility]    HPI: Patient is 77 y.o. male with paroxysmal atrial fibrillation, congestive heart failure, diabetes mellitus type 2, hypertension, hyperlipidemia, OSA, and end-stage renal disease on dialysis Monday Wednesday Friday who presented to the emergency department with dyspnea and near syncopal episode.  Apparently patient had decreased mentation throughout the course of the day associated with lightheadedness and shortness of breath.  On initial physical exam his blood pressure was 106/65, respiratory rate 25, heart rate 79 temperature 97.5, and oxygen saturation 100% on 4 L nasal cannula.  His lungs had rales of the left base, heart sounds normal, abdomen nontender no lower extremity edema.  ABG with pH of 7.34, PCO2 50, PaO2 27 bicarb 27.9 O2 saturation 97.5 on supplemental O2.  Bicarb 22, white count 9.8 hemoglobin 9.4 chest x-ray with cardiomegaly, increased interstitial markings bilaterally more the basis and cephalization of vasculature.  EKG atrial fibrillation 71.  Patient was [facility] from 11/10[facility]21 for acute hypoxic and hypercapnic respiratory failure due to volume overload.  Course was complicated by the finding of an acute CVA found on brain MRI after patient continued to have persistent confusion.  Patient also has peripheral vascular disease with dry gangrene of left toe, was seen by vascular surgery  and underwent a left lower extremity bilateral runoff with atherectomy eluding stent to the left superficial femoropopliteal artery and angioplasty to the left posterior tibial artery.  Patient continued its Monday Wednesday Friday hemodialysis and regained euvolemia at the time of discharge.  Patient is admitted to skilled nursing facility for OT/PT.  While at skilled nursing facility patient will be followed for atrial fibrillation treated with metoprolol and warfarin, hyperlipidemia treated with Lipitor and hypertension treated with metoprolol.   Past Medical History:  Diagnosis Date  . A-fib ([place])   . Anemia   . Blood transfusion   . BPH (benign prostatic hyperplasia)   . CHF (congestive heart failure) ([place])   . Diarrhea   . DM (diabetes mellitus) ([place])   . ESRD on hemodialysis ([place])    Started dialysis in 2009  . History of GI bleed    secondary to coumadin  . HTN (hypertension)   . Hyperlipidemia   . OSA (obstructive sleep apnea)    uses CPAP  . Secondary hyperparathyroidism of renal origin [facility])     Past Surgical History:  Procedure Laterality Date  . ABDOMINAL AORTOGRAM N/A 11/15/2018   Procedure: ABDOMINAL AORTOGRAM;  Surgeon: [name], MD;  Location: [place] CV LAB;  Service: Cardiovascular;  Laterality: N/A;  . BVT  6/31/49   Left  Basilic Vein Transposition  . CHOLECYSTECTOMY    . EYE SURGERY     Catarct bil  . INSERTION OF DIALYSIS CATHETER  05/28/2012   Procedure: INSERTION OF DIALYSIS CATHETER;  Surgeon: Mal Misty, MD;  Location: [place];  Service: Vascular;  Laterality: Right;  .  Left arm shuntogram.    . Left forearm loop graft with 6 mm Gore-Tex graft.    . LOWER EXTREMITY ANGIOGRAPHY Bilateral 11/15/2018   Procedure: LOWER EXTREMITY ANGIOGRAPHY;  Surgeon: [name], MD;  Location: [place] CV LAB;  Service: Cardiovascular;  Laterality: Bilateral;  . Pars plana vitrectomy with 25-gauge system    . PERIPHERAL VASCULAR ATHERECTOMY Left  11/15/2018   Procedure: PERIPHERAL VASCULAR ATHERECTOMY;  Surgeon: [name], MD;  Location: [place] CV LAB;  Service: Cardiovascular;  Laterality: Left;  SFA with STENT  . PERIPHERAL VASCULAR BALLOON ANGIOPLASTY Left 11/15/2018   Procedure: PERIPHERAL VASCULAR BALLOON ANGIOPLASTY;  Surgeon: [name], MD;  Location: [place] CV LAB;  Service: Cardiovascular;  Laterality: Left;  PT    Allergies as of 11/18/2018      Reactions   Tape Itching, Other (See Comments)   Cloth tape only      Medication List        Accurate as of 11/18/18 11:13 AM. Always use your most recent med list.          acetaminophen 500 MG tablet Commonly known as:  TYLENOL Take 1,000 mg by mouth 3 (three) times daily.   aspirin 325 MG tablet Take 1 tablet (325 mg total) by mouth daily for 15 days.   atorvastatin 20 MG tablet Commonly known as:  LIPITOR Take 1 tablet (20 mg total) by mouth daily at 6 PM.   feeding supplement (ENSURE ENLIVE) Liqd Take 237 mLs by mouth 2 (two) times daily between meals.   feeding supplement (PRO-STAT SUGAR FREE 64) Liqd Take 30 mLs by mouth daily.   hydrOXYzine 25 MG tablet Commonly known as:  ATARAX/VISTARIL Take 25 mg by mouth every 8 (eight) hours as needed for itching.   metoprolol tartrate 50 MG tablet Commonly known as:  LOPRESSOR Take 1 tablet (50 mg total) by mouth 2 (two) times daily.   multivitamin Tabs tablet Take 1 tablet by mouth at bedtime.   warfarin 5 MG tablet Commonly known as:  COUMADIN Take 1 tablet (5 mg total) by mouth daily at 6 PM.       No orders of the defined types were placed in this encounter.    There is no immunization history on file for this patient.  Social History   Tobacco Use  . Smoking status: Former Smoker    Types: Cigarettes    Last attempt to quit: 06/30/2004    Years since quitting: 14.3  . Smokeless tobacco: Never Used  Substance Use Topics  . Alcohol use: No    Family history is    Family History  Problem Relation Age of Onset  . Alzheimer's disease Mother   . Diabetes Father        Amputation:  bilateral legs  . Cancer Daughter        breast cancer  . Diabetes Son   . Heart disease Son        before age 41  . Hypertension Son   . Anesthesia problems Neg Hx       Review of Systems  DATA OBTAINED: from patient, nurse GENERAL:  no fevers, fatigue, appetite changes SKIN: No itching, or rash EYES: No eye pain, redness, discharge EARS: No earache, tinnitus, change in hearing NOSE: No congestion, drainage or bleeding  MOUTH/THROAT: No mouth or tooth pain, No sore throat RESPIRATORY: No cough, wheezing, SOB CARDIAC: No chest pain, palpitations, lower extremity edema  GI: No abdominal pain,  No N/V/D or constipation, No heartburn or reflux  GU: No dysuria, frequency or urgency, or incontinence  MUSCULOSKELETAL: No unrelieved bone/joint pain NEUROLOGIC: No headache, dizziness or focal weakness PSYCHIATRIC: No c/o anxiety or sadness   Vitals:   11/18/18 1100  BP: 114/63  Pulse: 92  Resp: 18  Temp: 98 F (36.7 C)  SpO2: 100%    SpO2 Readings from Last 1 Encounters:  11/18/18 100%   Body mass index is 23.89 kg/m.     Physical Exam  GENERAL APPEARANCE: Alert, conversant,  No acute distress.  SKIN: No diaphoresis rash HEAD: Normocephalic, atraumatic  EYES: Conjunctiva/lids clear. Pupils round, reactive. EOMs intact.  EARS: External exam WNL, canals clear. Hearing grossly normal.  NOSE: No deformity or discharge.  MOUTH/THROAT: Lips w/o lesions  RESPIRATORY: Breathing is even, unlabored. Lung sounds are clear   CARDIOVASCULAR: Heart RRR no murmurs, rubs or gallops. No peripheral edema.   GASTROINTESTINAL: Abdomen is soft, non-tender, not distended w/ normal bowel sounds. GENITOURINARY: Bladder non tender, not distended  MUSCULOSKELETAL: No abnormal joints or musculature NEUROLOGIC:  Cranial nerves 2-12 grossly intact. Moves all extremities   PSYCHIATRIC: Mood and affect appropriate to situation, no behavioral issues  Patient Active Problem List   Diagnosis Date Noted  . 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 DNR (do not resuscitate) discussion   . Goals of care, counseling/discussion   . Palliative care by specialist   . Acute hypoxemic respiratory failure ([place]) 11/06/2018  . Volume overload 11/06/2018  . Multifocal pneumonia 11/06/2018  . Acute metabolic encephalopathy 77/41/2878  . Physical deconditioning 11/06/2018  . Acute respiratory failure with hypoxia ([place]) 11/04/2018  . CAP (community acquired pneumonia) 11/04/2018  . Pleural effusion, right 11/04/2018  . Increased ammonia level 11/04/2018  . Abnormal CT of the abdomen 11/04/2018  . Atrial fibrillation with RVR ([place]) 11/04/2018  . Sepsis ([place]) 10/21/2018  . Acute encephalopathy 10/21/2018  . Elevated alkaline phosphatase level 10/21/2018  . Chronic anemia 10/21/2018  . Thrombocytopenia ([place]) 10/21/2018  . Pulmonary HTN ([place]) 03/04/2017  . Aortic valve stenosis 03/04/2017  . Hypertension, accelerated with heart disease, without CHF 01/12/2017  . Dyspnea on exertion 01/12/2017  . ESRD (end stage renal disease) ([place]) 06/07/2012  . Hyperlipidemia 04/24/2011  . HYPERTENSION, BENIGN 10/24/2009  . DM (diabetes mellitus) ([place]) 10/23/2009  . OBSTRUCTIVE SLEEP APNEA 10/23/2009  . PAF (paroxysmal atrial fibrillation) ([place]) 10/23/2009  . CHF (congestive heart failure) ([place]) 10/23/2009      Labs reviewed: Basic Metabolic Panel:    Component Value Date/Time   NA 134 (L) 11/17/2018 0242   K 3.8 11/17/2018 0242   CL 98 11/17/2018 0242   CO2 25 11/17/2018 0242   GLUCOSE 135 (H) 11/17/2018 0242   BUN 38 (H) 11/17/2018 0242   CREATININE 4.78 (H) 11/17/2018 0242   CALCIUM 8.6 (L) 11/17/2018 0242   CALCIUM 8.3 (L) 10/25/2008 1450   PROT 6.9 11/09/2018 0230   ALBUMIN 3.1 (L) 11/14/2018 0241   AST 20 11/09/2018 0230   ALT 26 11/09/2018 0230    ALKPHOS 197 (H) 11/09/2018 0230   BILITOT 0.8 11/09/2018 0230   GFRNONAA 11 (L) 11/17/2018 0242   GFRAA 12 (L) 11/17/2018 0242    Recent Labs    11/07/18 0848  11/08/18 0016 11/09/18 0230 11/11/18 0814 11/14/18 0241 11/16/18 0149 11/17/18 0242  NA 137   < > 138 137 134* 135 135 134*  K 4.4   < > 3.8 4.1 4.1 5.0 5.6* 3.8  CL  100   < > 100 100 96* 97* 97* 98  CO2 24   < > 26 27 24 25 26 25   GLUCOSE 116*   < > 105* 94 95 139* 107* 135*  BUN 54*   < > 15 39* 58* 72* 82* 38*  CREATININE 7.01*   < > 3.19* 5.34* 6.55* 7.61* 7.67* 4.78*  CALCIUM 8.9   < > 8.8* 9.1 9.3 9.8 9.3 8.6*  MG  --   --  2.0 2.2  --  2.4  --   --   PHOS 4.9*  --   --   --  2.7 2.4*  --   --    < > = values in this interval not displayed.   Liver Function Tests: Recent Labs    10/26/18 0538  11/07/18 2046 11/09/18 0230 11/11/18 0814 11/14/18 0241  AST 24  --  26 20  --   --   ALT 22  --  27 26  --   --   ALKPHOS 170*  --  198* 197*  --   --   BILITOT 1.1  --  1.1 0.8  --   --   PROT 6.8  --  7.0 6.9  --   --   ALBUMIN 3.0*   < > 3.0* 2.8* 2.9* 3.1*   < > = values in this interval not displayed.   Recent Labs    10/21/18 0520 10/24/18 0614  LIPASE 35 32   Recent Labs    10/26/18 0538 11/06/18 1850 11/09/18 0230  AMMONIA 42* 40* 42*   CBC: Recent Labs    11/09/18 0230  11/14/18 0241 11/16/18 0149 11/17/18 0242  WBC 7.7   < > 9.8 8.2 10.7*  NEUTROABS 5.0  --  7.4  --  7.4  HGB 8.7*   < > 8.9* 6.7* 8.7*  HCT 28.0*  27.1*   < > 28.9* 21.5* 27.0*  MCV 103.7*   < > 101.8* 102.9* 96.4  PLT 138*   < > 155 135* 133*   < > = values in this interval not displayed.   Lipid Recent Labs    11/11/18 1517  CHOL 113  HDL 39*  LDLCALC 62  TRIG 60    Cardiac Enzymes: Recent Labs    11/06/18 1855  TROPONINI <0.03   BNP: Recent Labs    11/06/18 1905  BNP 1,611.6*   No results found for: [facility] Lab Results  Component Value Date   HGBA1C 4.2 (L) 11/11/2018   Lab Results   Component Value Date   TSH 1.956 11/09/2018   Lab Results  Component Value Date   ZOXWRUEA54 098 11/11/2018   Lab Results  Component Value Date   FOLATE  10/17/2008    >20.0 (NOTE)  Reference Ranges        Deficient:       0.4 - 3.3 ng/mL        Indeterminate:   3.4 - 5.4 ng/mL        Normal:              > 5.4 ng/mL   Lab Results  Component Value Date   IRON 48 06/05/2011   TIBC 194 (L) 06/05/2011   FERRITIN 872 (H) 06/05/2011    Imaging and Procedures obtained prior to SNF admission: Dg Chest 2 View  Result Date: 11/07/2018 CLINICAL DATA:  Respiratory distress EXAM: CHEST end-stage renal disease on dialysis 2 VIEW COMPARISON:  11/06/2018, 10/28/2017, CT  chest 10/23/2018, 06/16/2012 FINDINGS: Bilateral interstitial and ground-glass opacity. No significant pleural effusion. Cardiomegaly with aortic atherosclerosis. No pneumothorax. IMPRESSION: Cardiomegaly. Moderate diffuse bilateral interstitial and ground-glass opacity which may be secondary to pulmonary edema or diffuse pneumonia. Electronically Signed   By: Donavan Foil M.D.   On: 11/07/2018 19:49   Dg Chest Portable 1 View  Result Date: 11/06/2018 CLINICAL DATA:  Syncope. EXAM: PORTABLE CHEST 1 VIEW COMPARISON:  October 28, 2018 FINDINGS: The heart size remains mildly enlarged. No pneumothorax. No nodules or masses. Patchy opacities in the lungs, particularly the right base. No other acute abnormalities. IMPRESSION: Patchy opacities in the lungs may represent multifocal pneumonia versus asymmetric edema. Recommend clinical correlation. Electronically Signed   By: Dorise Bullion III M.D   On: 11/06/2018 20:00     Not all labs, radiology exams or other studies done during hospitalization come through on my EPIC note; however they are reviewed by me.    Assessment and Plan  Acute respiratory failure with hypoxia hypercapnia/acute on chronic diastolic congestive heart failure/pulmonary hypertension/end-stage renal disease  on dialysis- patient was admitted to stepdown unit where he underwent hemodialysis with ultrafiltration with improvement of his volume status, echo showed preserved LV systolic function with elevated peak PA pressure SNF- admitted for OT/PT; continue dialysis Monday Wednesday Friday  Acute CVA metabolic encephalopathy -right millimeter acute ischemic nonhemorrhagic small vessel type infarct involving the posterior right corona radiata; neurology was consulted.  Echo showed normal LV ejection fraction, carotid Dopplers with no significant stenosis, LDL 62.  Patient was placed back on warfarin target INR 2-2-1/2 due to history of lower GI bleed, continue taking full dose aspirin 325 daily until INR becomes therapeutic.  Continue low-dose Lipitor SNF- continue Lipitor 20 mg daily and warfarin 5 mg daily we will actively titrate; ASA 325 daily for 15 days making stop date 11/30  Paroxysmal atrial fib- target INR 2-2-1/2, patient basically on aspirin bridge until INR therapeutic; DC INR 1.38 SNF- continue warfarin 5 mg daily with active titration until therapeutic  Diabetes mellitus type 2/hypoglycemia-patient on sliding scale insulin with highest reading being 166 SNF- patient does not appear to need insulin or anything for his diabetes at the present time  Peripheral vascular disease with dry gangrene of left fifth toe- seen by vascular surgery, underwent lower extremity bilateral runoff, with atherectomy eluding stent to the left superficial femoropopliteal artery, angioplasty to the left posterior tibial artery SNF-continue supportive care  Anemia of chronic kidney disease- patient received IV iron, darbepoetin, his nadir was 6.7 and required 2 units PRBC SNF- follow-up CBC  Hyperlipidemia SNF- LDL 62; continue Lipitor 20 mg daily  Hypertension SNF- controlled; continue metoprolol 50 mg twice daily   Time spent greater than 45 minutes;> 50% of time with patient was spent reviewing records,  labs, tests and studies, counseling and developing plan of care  Webb Silversmith D. [name] Coil, MD

## 2018-11-19 ENCOUNTER — Encounter: Payer: Self-pay | Admitting: Internal Medicine

## 2018-11-19 DIAGNOSIS — I639 Cerebral infarction, unspecified: Secondary | ICD-10-CM | POA: Insufficient documentation

## 2018-11-19 DIAGNOSIS — I5033 Acute on chronic diastolic (congestive) heart failure: Secondary | ICD-10-CM | POA: Insufficient documentation

## 2018-11-19 DIAGNOSIS — E162 Hypoglycemia, unspecified: Secondary | ICD-10-CM | POA: Insufficient documentation

## 2018-11-19 DIAGNOSIS — I96 Gangrene, not elsewhere classified: Secondary | ICD-10-CM | POA: Insufficient documentation

## 2018-11-19 DIAGNOSIS — E1351 Other specified diabetes mellitus with diabetic peripheral angiopathy without gangrene: Secondary | ICD-10-CM | POA: Insufficient documentation

## 2018-11-29 ENCOUNTER — Encounter: Payer: Self-pay | Admitting: Family

## 2018-11-29 ENCOUNTER — Encounter: Payer: Self-pay | Admitting: *Deleted

## 2018-11-29 ENCOUNTER — Other Ambulatory Visit: Payer: Self-pay | Admitting: *Deleted

## 2018-11-29 ENCOUNTER — Other Ambulatory Visit: Payer: Self-pay | Admitting: Hematology

## 2018-11-29 ENCOUNTER — Other Ambulatory Visit: Payer: Self-pay

## 2018-11-29 ENCOUNTER — Ambulatory Visit (INDEPENDENT_AMBULATORY_CARE_PROVIDER_SITE_OTHER): Payer: Self-pay | Admitting: Family

## 2018-11-29 VITALS — BP 106/74 | HR 73 | Temp 96.5°F

## 2018-11-29 DIAGNOSIS — N186 End stage renal disease: Secondary | ICD-10-CM

## 2018-11-29 DIAGNOSIS — Z9862 Peripheral vascular angioplasty status: Secondary | ICD-10-CM

## 2018-11-29 DIAGNOSIS — D649 Anemia, unspecified: Secondary | ICD-10-CM

## 2018-11-29 DIAGNOSIS — I96 Gangrene, not elsewhere classified: Secondary | ICD-10-CM

## 2018-11-29 DIAGNOSIS — I779 Disorder of arteries and arterioles, unspecified: Secondary | ICD-10-CM

## 2018-11-29 DIAGNOSIS — Z992 Dependence on renal dialysis: Secondary | ICD-10-CM

## 2018-11-29 DIAGNOSIS — I739 Peripheral vascular disease, unspecified: Secondary | ICD-10-CM

## 2018-11-29 NOTE — H&P (View-Only) (Signed)
[facility] [place]   CC: Follow up peripheral artery occlusive disease  History of Present Illness [name] is a 77 y.o. male who is s/p               1. Ultrasound-guided access, right femoral artery             2.  Abdominal aortogram             3.  Bilateral lower extremity runoff             4.  Atherectomy with drug-eluting stent, left superficial femoral-popliteal artery             5.  Angioplasty, left posterior tibial artery             6.  Conscious sedation (100 minutes)             7.  Closure device, Mynx On 11-15-18 by Dr. Trula Slade for Left foot ulcer, nonhealing wound is left fifth toe.   He has a follow up appointment on 12-12-18 with ABI and LLE arterial duplex, see me.   He returns today with report from [place] at [facility] rehab that wound is worse and has odor, left foot. Pt reports pain in both feet.   He reports what sounds like baseline dyspnea. He is seated in his w/c, wearing nasal canula with supplemental O2.   He has ESRD and dialyzes via a left upper arm AVF which pt states is working well.     Diabetic: Yes, 4.2 A1C on 11-11-18 Tobacco use: former smoker, quit in 2005  Pt meds include: Statin :Yes Betablocker: No ASA: Yes Other anticoagulants/antiplatelets: coumadin, has atrial fib.   Past Medical History:  Diagnosis Date  . A-fib ([place])   . Anemia   . Blood transfusion   . BPH (benign prostatic hyperplasia)   . CHF (congestive heart failure) ([place])   . Diarrhea   . DM (diabetes mellitus) ([place])   . ESRD on hemodialysis ([place])    Started dialysis in 2009  . History of GI bleed    secondary to coumadin  . HTN (hypertension)   . Hyperlipidemia   . OSA (obstructive sleep apnea)    uses CPAP  . Secondary hyperparathyroidism of renal origin [facility])     Social History Social History   Tobacco Use  . Smoking status: Former Smoker    Types: Cigarettes    Last attempt to quit: 06/30/2004    Years since quitting:  14.4  . Smokeless tobacco: Never Used  Substance Use Topics  . Alcohol use: No  . Drug use: No    Family History Family History  Problem Relation Age of Onset  . Alzheimer's disease Mother   . Diabetes Father        Amputation:  bilateral legs  . Cancer Daughter        breast cancer  . Diabetes Son   . Heart disease Son        before age 40  . Hypertension Son   . Anesthesia problems Neg Hx     Past Surgical History:  Procedure Laterality Date  . ABDOMINAL AORTOGRAM N/A 11/15/2018   Procedure: ABDOMINAL AORTOGRAM;  Surgeon: [name], MD;  Location: [place] CV LAB;  Service: Cardiovascular;  Laterality: N/A;  . BVT  8/67/61   Left  Basilic Vein Transposition  . CHOLECYSTECTOMY    . EYE SURGERY     Catarct bil  .  INSERTION OF DIALYSIS CATHETER  05/28/2012   Procedure: INSERTION OF DIALYSIS CATHETER;  Surgeon: Mal Misty, MD;  Location: [place];  Service: Vascular;  Laterality: Right;  . Left arm shuntogram.    . Left forearm loop graft with 6 mm Gore-Tex graft.    . LOWER EXTREMITY ANGIOGRAPHY Bilateral 11/15/2018   Procedure: LOWER EXTREMITY ANGIOGRAPHY;  Surgeon: [name], MD;  Location: [place] CV LAB;  Service: Cardiovascular;  Laterality: Bilateral;  . Pars plana vitrectomy with 25-gauge system    . PERIPHERAL VASCULAR ATHERECTOMY Left 11/15/2018   Procedure: PERIPHERAL VASCULAR ATHERECTOMY;  Surgeon: [name], MD;  Location: [place] CV LAB;  Service: Cardiovascular;  Laterality: Left;  SFA with STENT  . PERIPHERAL VASCULAR BALLOON ANGIOPLASTY Left 11/15/2018   Procedure: PERIPHERAL VASCULAR BALLOON ANGIOPLASTY;  Surgeon: [name], MD;  Location: [place] CV LAB;  Service: Cardiovascular;  Laterality: Left;  PT    Allergies  Allergen Reactions  . Tape Itching and Other (See Comments)    Cloth tape only    Current Outpatient Medications  Medication Sig Dispense Refill  . acetaminophen (TYLENOL) 500 MG tablet Take  1,000 mg by mouth 3 (three) times daily.    . Amino Acids-Protein Hydrolys (FEEDING SUPPLEMENT, PRO-STAT SUGAR FREE 64,) LIQD Take 30 mLs by mouth daily. 900 mL 0  . aspirin 325 MG tablet Take 1 tablet (325 mg total) by mouth daily for 15 days. 15 tablet 0  . atorvastatin (LIPITOR) 20 MG tablet Take 1 tablet (20 mg total) by mouth daily at 6 PM. 30 tablet 0  . feeding supplement, ENSURE ENLIVE, (ENSURE ENLIVE) LIQD Take 237 mLs by mouth 2 (two) times daily between meals. 14220 mL 0  . hydrOXYzine (ATARAX/VISTARIL) 25 MG tablet Take 25 mg by mouth every 8 (eight) hours as needed for itching.    . metoprolol tartrate (LOPRESSOR) 50 MG tablet Take 1 tablet (50 mg total) by mouth 2 (two) times daily. 60 tablet 0  . multivitamin (RENA-VIT) TABS tablet Take 1 tablet by mouth at bedtime. 30 tablet 0  . warfarin (COUMADIN) 5 MG tablet Take 1 tablet (5 mg total) by mouth daily at 6 PM. 30 tablet 0   No current facility-administered medications for this visit.     ROS: See HPI for pertinent positives and negatives.   Physical Examination  Vitals:   11/29/18 1150  BP: 106/74  Pulse: 73  Temp: (!) 96.5 F (35.8 C)  SpO2: 95%   Respirations: 24/minute  There is no height or weight on file to calculate BMI.  General: A&O x 2, WDWN, male. Gait: seated in w/c HENT: No gross abnormalities.  Eyes: PERRLA. Pulmonary: Respirations are mildly labored at rest with mild use of accessory muscles. Supplemental O2 in place via [place].  Cardiac: irregular rhythm      Radial pulses are not palpable bilaterally.  Left upper arm AVF with palpable thrill.  Adominal aortic pulse is not palpable                         VASCULAR EXAM: Extremities with ischemic changes, with Gangrene; with open wounds. See photo below. Foul odor from left foot. Macerated wound left 5th toe. Several other shallow wounds of both feet, left more so than right.  LE Pulses Right Left       FEMORAL  not palpable seated in w/c  not palpable        POPLITEAL  not palpable   not palpable       POSTERIOR TIBIAL  not palpable, monophasic Doppler signal   not palpable, brisk monophasic signal        DORSALIS PEDIS      ANTERIOR TIBIAL not palpable, monophasic signal  not palpable, monophasic signal    Abdomen: soft, NT, no palpable masses. Skin: no rashes, See Extremities Musculoskeletal: no muscle wasting or atrophy.  Neurologic: A&O X 2; appropriate affect, Sensation is diminished in lower legs and feet; MOTOR FUNCTION:  moving all extremities equally, motor strength 4/5 throughout. Speech is fluent/normal. CN 2-12 grossly intact. Psychiatric: Thought content is normal, mood appropriate for clinical situation.     ASSESSMENT: [name] is a 77 y.o. male who is s/p               1. Ultrasound-guided access, right femoral artery             2.  Abdominal aortogram             3.  Bilateral lower extremity runoff             4.  Atherectomy with drug-eluting stent, left superficial femoral-popliteal artery             5.  Angioplasty, left posterior tibial artery             6.  Conscious sedation (100 minutes)             7.  Closure device, Mynx On 11-15-18 by Dr. Trula Slade for Left foot ulcer, nonhealing wound is left fifth toe.   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  He has ESRD and dialyzes via a left upper arm AVF which pt states is working well.   Dr. Donnetta Hutching spoke with and examined pt. Will stop coumadin for 2 days, see Plan.   DATA None recently   PLAN:  Based on the patient's HPI and examination, pt will be scheduled for left 5th toe amputation by Dr. Trula Slade or Donzetta Matters; if neither available soon, then soonest available surgeon.   The patient was given information about PAD including signs, symptoms, treatment, what symptoms should prompt the patient to seek immediate medical care,  and risk reduction measures to take.  Clemon Chambers, RN, MSN, FNP-C [facility] Phone: [phone]  Clinic MD: Bishop Dublin  11/29/18 12:58 PM

## 2018-11-29 NOTE — Progress Notes (Signed)
[facility] [place]   CC: Follow up peripheral artery occlusive disease  History of Present Illness [name] is a 77 y.o. male who is s/p               1. Ultrasound-guided access, right femoral artery             2.  Abdominal aortogram             3.  Bilateral lower extremity runoff             4.  Atherectomy with drug-eluting stent, left superficial femoral-popliteal artery             5.  Angioplasty, left posterior tibial artery             6.  Conscious sedation (100 minutes)             7.  Closure device, Mynx On 11-15-18 by Dr. Trula Slade for Left foot ulcer, nonhealing wound is left fifth toe.   He has a follow up appointment on 12-12-18 with ABI and LLE arterial duplex, see me.   He returns today with report from [place] at [facility] rehab that wound is worse and has odor, left foot. Pt reports pain in both feet.   He reports what sounds like baseline dyspnea. He is seated in his w/c, wearing nasal canula with supplemental O2.   He has ESRD and dialyzes via a left upper arm AVF which pt states is working well.     Diabetic: Yes, 4.2 A1C on 11-11-18 Tobacco use: former smoker, quit in 2005  Pt meds include: Statin :Yes Betablocker: No ASA: Yes Other anticoagulants/antiplatelets: coumadin, has atrial fib.   Past Medical History:  Diagnosis Date  . A-fib ([place])   . Anemia   . Blood transfusion   . BPH (benign prostatic hyperplasia)   . CHF (congestive heart failure) ([place])   . Diarrhea   . DM (diabetes mellitus) ([place])   . ESRD on hemodialysis ([place])    Started dialysis in 2009  . History of GI bleed    secondary to coumadin  . HTN (hypertension)   . Hyperlipidemia   . OSA (obstructive sleep apnea)    uses CPAP  . Secondary hyperparathyroidism of renal origin [facility])     Social History Social History   Tobacco Use  . Smoking status: Former Smoker    Types: Cigarettes    Last attempt to quit: 06/30/2004    Years since quitting:  14.4  . Smokeless tobacco: Never Used  Substance Use Topics  . Alcohol use: No  . Drug use: No    Family History Family History  Problem Relation Age of Onset  . Alzheimer's disease Mother   . Diabetes Father        Amputation:  bilateral legs  . Cancer Daughter        breast cancer  . Diabetes Son   . Heart disease Son        before age 58  . Hypertension Son   . Anesthesia problems Neg Hx     Past Surgical History:  Procedure Laterality Date  . ABDOMINAL AORTOGRAM N/A 11/15/2018   Procedure: ABDOMINAL AORTOGRAM;  Surgeon: [name], MD;  Location: [place] [facility];  Service: Cardiovascular;  Laterality: N/A;  . BVT  09/17/18   Left  Basilic Vein Transposition  . CHOLECYSTECTOMY    . EYE SURGERY     Catarct bil  .  INSERTION OF DIALYSIS CATHETER  05/28/2012   Procedure: INSERTION OF DIALYSIS CATHETER;  Surgeon: Mal Misty, MD;  Location: [place];  Service: Vascular;  Laterality: Right;  . Left arm shuntogram.    . Left forearm loop graft with 6 mm Gore-Tex graft.    . LOWER EXTREMITY ANGIOGRAPHY Bilateral 11/15/2018   Procedure: LOWER EXTREMITY ANGIOGRAPHY;  Surgeon: [name], MD;  Location: [place] [facility];  Service: Cardiovascular;  Laterality: Bilateral;  . Pars plana vitrectomy with 25-gauge system    . PERIPHERAL VASCULAR ATHERECTOMY Left 11/15/2018   Procedure: PERIPHERAL VASCULAR ATHERECTOMY;  Surgeon: [name], MD;  Location: [place] [facility];  Service: Cardiovascular;  Laterality: Left;  SFA with STENT  . PERIPHERAL VASCULAR BALLOON ANGIOPLASTY Left 11/15/2018   Procedure: PERIPHERAL VASCULAR BALLOON ANGIOPLASTY;  Surgeon: [name], MD;  Location: [place] [facility];  Service: Cardiovascular;  Laterality: Left;  PT    Allergies  Allergen Reactions  . Tape Itching and Other (See Comments)    Cloth tape only    Current Outpatient Medications  Medication Sig Dispense Refill  . acetaminophen (TYLENOL) 500 MG tablet Take  1,000 mg by mouth 3 (three) times daily.    . Amino Acids-Protein Hydrolys (FEEDING SUPPLEMENT, PRO-STAT SUGAR FREE 64,) LIQD Take 30 mLs by mouth daily. 900 mL 0  . aspirin 325 MG tablet Take 1 tablet (325 mg total) by mouth daily for 15 days. 15 tablet 0  . atorvastatin (LIPITOR) 20 MG tablet Take 1 tablet (20 mg total) by mouth daily at 6 PM. 30 tablet 0  . feeding supplement, ENSURE ENLIVE, (ENSURE ENLIVE) LIQD Take 237 mLs by mouth 2 (two) times daily between meals. 14220 mL 0  . hydrOXYzine (ATARAX/VISTARIL) 25 MG tablet Take 25 mg by mouth every 8 (eight) hours as needed for itching.    . metoprolol tartrate (LOPRESSOR) 50 MG tablet Take 1 tablet (50 mg total) by mouth 2 (two) times daily. 60 tablet 0  . multivitamin (RENA-VIT) TABS tablet Take 1 tablet by mouth at bedtime. 30 tablet 0  . warfarin (COUMADIN) 5 MG tablet Take 1 tablet (5 mg total) by mouth daily at 6 PM. 30 tablet 0   No current facility-administered medications for this visit.     ROS: See HPI for pertinent positives and negatives.   Physical Examination  Vitals:   11/29/18 1150  BP: 106/74  Pulse: 73  Temp: (!) 96.5 F (35.8 C)  SpO2: 95%   Respirations: 24/minute  There is no height or weight on file to calculate BMI.  General: A&O x 2, WDWN, male. Gait: seated in w/c HENT: No gross abnormalities.  Eyes: PERRLA. Pulmonary: Respirations are mildly labored at rest with mild use of accessory muscles. Supplemental O2 in place via [place].  Cardiac: irregular rhythm      Radial pulses are not palpable bilaterally.  Left upper arm AVF with palpable thrill.  Adominal aortic pulse is not palpable                         VASCULAR EXAM: Extremities with ischemic changes, with Gangrene; with open wounds. See photo below. Foul odor from left foot. Macerated wound left 5th toe. Several other shallow wounds of both feet, left more so than right.  LE Pulses Right Left       FEMORAL  not palpable seated in w/c  not palpable        POPLITEAL  not palpable   not palpable       POSTERIOR TIBIAL  not palpable, monophasic Doppler signal   not palpable, brisk monophasic signal        DORSALIS PEDIS      ANTERIOR TIBIAL not palpable, monophasic signal  not palpable, monophasic signal    Abdomen: soft, NT, no palpable masses. Skin: no rashes, See Extremities Musculoskeletal: no muscle wasting or atrophy.  Neurologic: A&O X 2; appropriate affect, Sensation is diminished in lower legs and feet; MOTOR FUNCTION:  moving all extremities equally, motor strength 4/5 throughout. Speech is fluent/normal. CN 2-12 grossly intact. Psychiatric: Thought content is normal, mood appropriate for clinical situation.     ASSESSMENT: [name] is a 77 y.o. male who is s/p               1. Ultrasound-guided access, right femoral artery             2.  Abdominal aortogram             3.  Bilateral lower extremity runoff             4.  Atherectomy with drug-eluting stent, left superficial femoral-popliteal artery             5.  Angioplasty, left posterior tibial artery             6.  Conscious sedation (100 minutes)             7.  Closure device, Mynx On 11-15-18 by Dr. Trula Slade for Left foot ulcer, nonhealing wound is left fifth toe.   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  He has ESRD and dialyzes via a left upper arm AVF which pt states is working well.   Dr. Donnetta Hutching spoke with and examined pt. Will stop coumadin for 2 days, see Plan.   DATA None recently   PLAN:  Based on the patient's HPI and examination, pt will be scheduled for left 5th toe amputation by Dr. Trula Slade or Donzetta Matters; if neither available soon, then soonest available surgeon.   The patient was given information about PAD including signs, symptoms, treatment, what symptoms should prompt the patient to seek immediate medical care,  and risk reduction measures to take.  Clemon Chambers, RN, MSN, FNP-C [facility] [employer] Phone: [phone]  Clinic MD: Bishop [place]  11/29/18 12:58 PM

## 2018-11-30 ENCOUNTER — Encounter: Payer: Self-pay | Admitting: Family

## 2018-12-01 ENCOUNTER — Inpatient Hospital Stay: Payer: Medicare Other

## 2018-12-01 ENCOUNTER — Inpatient Hospital Stay: Payer: Medicare Other | Attending: Hematology | Admitting: Hematology

## 2018-12-01 ENCOUNTER — Inpatient Hospital Stay (HOSPITAL_COMMUNITY)
Admission: EM | Admit: 2018-12-01 | Discharge: 2018-12-04 | DRG: 811 | Disposition: A | Discharge status: Home / Self Care | Payer: Medicare Other | Attending: Family Medicine | Admitting: Family Medicine

## 2018-12-01 DIAGNOSIS — Z79899 Other long term (current) drug therapy: Secondary | ICD-10-CM

## 2018-12-01 DIAGNOSIS — Z8719 Personal history of other diseases of the digestive system: Secondary | ICD-10-CM

## 2018-12-01 DIAGNOSIS — I350 Nonrheumatic aortic (valve) stenosis: Secondary | ICD-10-CM | POA: Diagnosis present

## 2018-12-01 DIAGNOSIS — Z9181 History of falling: Secondary | ICD-10-CM

## 2018-12-01 DIAGNOSIS — R195 Other fecal abnormalities: Secondary | ICD-10-CM

## 2018-12-01 DIAGNOSIS — Z7901 Long term (current) use of anticoagulants: Secondary | ICD-10-CM

## 2018-12-01 DIAGNOSIS — D62 Acute posthemorrhagic anemia: Secondary | ICD-10-CM | POA: Diagnosis present

## 2018-12-01 DIAGNOSIS — R0902 Hypoxemia: Secondary | ICD-10-CM | POA: Diagnosis present

## 2018-12-01 DIAGNOSIS — I480 Paroxysmal atrial fibrillation: Secondary | ICD-10-CM | POA: Diagnosis present

## 2018-12-01 DIAGNOSIS — E1122 Type 2 diabetes mellitus with diabetic chronic kidney disease: Secondary | ICD-10-CM | POA: Diagnosis present

## 2018-12-01 DIAGNOSIS — Z803 Family history of malignant neoplasm of breast: Secondary | ICD-10-CM

## 2018-12-01 DIAGNOSIS — Z992 Dependence on renal dialysis: Secondary | ICD-10-CM

## 2018-12-01 DIAGNOSIS — I509 Heart failure, unspecified: Secondary | ICD-10-CM

## 2018-12-01 DIAGNOSIS — I272 Pulmonary hypertension, unspecified: Secondary | ICD-10-CM | POA: Diagnosis present

## 2018-12-01 DIAGNOSIS — D500 Iron deficiency anemia secondary to blood loss (chronic): Secondary | ICD-10-CM | POA: Diagnosis not present

## 2018-12-01 DIAGNOSIS — E785 Hyperlipidemia, unspecified: Secondary | ICD-10-CM | POA: Diagnosis present

## 2018-12-01 DIAGNOSIS — L8992 Pressure ulcer of unspecified site, stage 2: Secondary | ICD-10-CM | POA: Diagnosis present

## 2018-12-01 DIAGNOSIS — N400 Enlarged prostate without lower urinary tract symptoms: Secondary | ICD-10-CM | POA: Diagnosis present

## 2018-12-01 DIAGNOSIS — L8990 Pressure ulcer of unspecified site, unspecified stage: Secondary | ICD-10-CM

## 2018-12-01 DIAGNOSIS — I119 Hypertensive heart disease without heart failure: Secondary | ICD-10-CM | POA: Diagnosis present

## 2018-12-01 DIAGNOSIS — Z66 Do not resuscitate: Secondary | ICD-10-CM | POA: Diagnosis present

## 2018-12-01 DIAGNOSIS — I9589 Other hypotension: Secondary | ICD-10-CM | POA: Diagnosis present

## 2018-12-01 DIAGNOSIS — Z833 Family history of diabetes mellitus: Secondary | ICD-10-CM

## 2018-12-01 DIAGNOSIS — Z9049 Acquired absence of other specified parts of digestive tract: Secondary | ICD-10-CM

## 2018-12-01 DIAGNOSIS — D649 Anemia, unspecified: Secondary | ICD-10-CM | POA: Diagnosis present

## 2018-12-01 DIAGNOSIS — G4733 Obstructive sleep apnea (adult) (pediatric): Secondary | ICD-10-CM | POA: Diagnosis present

## 2018-12-01 DIAGNOSIS — I5030 Unspecified diastolic (congestive) heart failure: Secondary | ICD-10-CM | POA: Diagnosis present

## 2018-12-01 DIAGNOSIS — Z8249 Family history of ischemic heart disease and other diseases of the circulatory system: Secondary | ICD-10-CM

## 2018-12-01 DIAGNOSIS — N2581 Secondary hyperparathyroidism of renal origin: Secondary | ICD-10-CM | POA: Diagnosis present

## 2018-12-01 DIAGNOSIS — Z820 Family history of epilepsy and other diseases of the nervous system: Secondary | ICD-10-CM

## 2018-12-01 DIAGNOSIS — Z8673 Personal history of transient ischemic attack (TIA), and cerebral infarction without residual deficits: Secondary | ICD-10-CM

## 2018-12-01 DIAGNOSIS — E1152 Type 2 diabetes mellitus with diabetic peripheral angiopathy with gangrene: Secondary | ICD-10-CM | POA: Diagnosis present

## 2018-12-01 DIAGNOSIS — F0390 Unspecified dementia without behavioral disturbance: Secondary | ICD-10-CM | POA: Diagnosis present

## 2018-12-01 DIAGNOSIS — Z87891 Personal history of nicotine dependence: Secondary | ICD-10-CM

## 2018-12-01 DIAGNOSIS — D631 Anemia in chronic kidney disease: Secondary | ICD-10-CM | POA: Diagnosis present

## 2018-12-01 DIAGNOSIS — K922 Gastrointestinal hemorrhage, unspecified: Secondary | ICD-10-CM | POA: Diagnosis present

## 2018-12-01 DIAGNOSIS — Z7189 Other specified counseling: Secondary | ICD-10-CM

## 2018-12-01 DIAGNOSIS — N186 End stage renal disease: Secondary | ICD-10-CM | POA: Diagnosis present

## 2018-12-01 DIAGNOSIS — I132 Hypertensive heart and chronic kidney disease with heart failure and with stage 5 chronic kidney disease, or end stage renal disease: Secondary | ICD-10-CM | POA: Diagnosis present

## 2018-12-01 DIAGNOSIS — E861 Hypovolemia: Secondary | ICD-10-CM

## 2018-12-01 MED ORDER — SODIUM CHLORIDE 0.9 % IV BOLUS
500.0000 mL | Freq: Once | INTRAVENOUS | Status: AC
  Administered 2018-12-02: 500 mL via INTRAVENOUS

## 2018-12-01 NOTE — ED Triage Notes (Signed)
Post fall from SNF. No c/o pain at the moment. A/Ox1.

## 2018-12-01 NOTE — ED Provider Notes (Signed)
[facility] EMERGENCY DEPARTMENT Provider Note  CSN: [id number] Arrival date & time: 12/01/18 2321  Chief Complaint(s) Fall and Hypotension  HPI [name] is a 77 y.o. male with extensive past medical history listed below including atrial fibrillation on Coumadin, ESRD on dialysis, history of GI bleeds who presents to the emergency department after being found down in a skilled nursing facility next to his bed.  EMS reported that they when they arrived the patient was already back in bed.  They reported that the bed was low to the ground due to his fall risk.  They noted that the patient was confused and skilled nursing facility said he has a history of dementia but this was not noted in his paperwork.  They are unsure of his baseline mental status.  EMS did note that the patient had low blood pressures with systolics in the 73A.  Patient also noted to be hypoxic at the skilled nursing facility but was off of his oxygen.  They are unsure of his oxygen requirements so EMS placed him on 3 L nasal cannula he has been satting well since.  Patient is oriented x2 and currently denies any physical complaints.  Remainder of history, ROS, and physical exam limited due to patient's condition (AMS vs dementia). Additional information was obtained from EMS and sNF records.   Level V Caveat.    HPI  Past Medical History Past Medical History:  Diagnosis Date  . A-fib ([place])   . Anemia   . Blood transfusion   . BPH (benign prostatic hyperplasia)   . CHF (congestive heart failure) ([place])   . Diarrhea   . DM (diabetes mellitus) ([place])   . ESRD on hemodialysis ([place])    Started dialysis in 2009  . History of GI bleed    secondary to coumadin  . HTN (hypertension)   . Hyperlipidemia   . OSA (obstructive sleep apnea)    uses CPAP  . Secondary hyperparathyroidism of renal origin [facility])    Patient Active Problem List   Diagnosis Date Noted  . Acute on chronic diastolic  (congestive) heart failure ([place]) 11/19/2018  . Acute CVA (cerebrovascular accident) ([place]) 11/19/2018  . Hypoglycemia 11/19/2018  . Peripheral vascular disease due to secondary diabetes ([place]) 11/19/2018  . 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 DNR (do not resuscitate) discussion   . Goals of care, counseling/discussion   . Palliative care by specialist   . Acute hypoxemic respiratory failure ([place]) 11/06/2018  . Volume overload 11/06/2018  . Multifocal pneumonia 11/06/2018  . Acute metabolic encephalopathy 19/37/9024  . Physical deconditioning 11/06/2018  . Acute respiratory failure with hypoxia and hypercapnia ([place]) 11/04/2018  . CAP (community acquired pneumonia) 11/04/2018  . Pleural effusion, right 11/04/2018  . Increased ammonia level 11/04/2018  . Abnormal CT of the abdomen 11/04/2018  . Atrial fibrillation with RVR ([place]) 11/04/2018  . Sepsis ([place]) 10/21/2018  . Acute encephalopathy 10/21/2018  . Elevated alkaline phosphatase level 10/21/2018  . Chronic anemia 10/21/2018  . Thrombocytopenia ([place]) 10/21/2018  . Pulmonary HTN ([place]) 03/04/2017  . Aortic valve stenosis 03/04/2017  . Hypertension, accelerated with heart disease, without CHF 01/12/2017  . Dyspnea on exertion 01/12/2017  . ESRD (end stage renal disease) ([place]) 06/07/2012  . Hyperlipidemia 04/24/2011  . HYPERTENSION, BENIGN 10/24/2009  . DM (diabetes mellitus) ([place]) 10/23/2009  . OBSTRUCTIVE SLEEP APNEA 10/23/2009  . PAF (paroxysmal atrial fibrillation) ([place]) 10/23/2009  . CHF (congestive heart  failure) ([place]) 10/23/2009   Home Medication(s) Prior to Admission medications   Medication Sig Start Date End Date Taking? Authorizing Provider  acetaminophen (TYLENOL) 500 MG tablet Take 1,000 mg by mouth 3 (three) times daily.    [provider]  Amino Acids-Protein Hydrolys (FEEDING SUPPLEMENT, PRO-STAT SUGAR FREE 64,) LIQD Take 30 mLs by mouth  daily. Patient taking differently: Take 30 mLs by mouth every evening.  11/17/18 12/17/18  Arrien, Jimmy Picket, MD  atorvastatin (LIPITOR) 20 MG tablet Take 1 tablet (20 mg total) by mouth daily at 6 PM. 11/17/18 12/17/18  Arrien, Jimmy Picket, MD  loperamide (IMODIUM A-D) 2 MG tablet Take 4 mg by mouth See admin instructions. Give 4 mg after 1st loose stool as needed for diarrhea    [provider]  metoprolol tartrate (LOPRESSOR) 25 MG tablet Take 25 mg by mouth 2 (two) times daily. Do not give before dialysis Mon, Wed, and Fri    [provider]  midodrine (PROAMATINE) 10 MG tablet Take 10 mg by mouth every Monday, Wednesday, and Friday. 30 minutes prior to dialysis treatment    [provider]  multivitamin (RENA-VIT) TABS tablet Take 1 tablet by mouth at bedtime. Patient taking differently: Take 1 tablet by mouth daily.  11/17/18 12/17/18  Arrien, Jimmy Picket, MD  warfarin (COUMADIN) 5 MG tablet Take 1 tablet (5 mg total) by mouth daily at 6 PM. Patient not taking: Reported on 12/01/2018 11/17/18 12/17/18  Arrien, Jimmy Picket, MD                                                                                                                                    Past Surgical History Past Surgical History:  Procedure Laterality Date  . ABDOMINAL AORTOGRAM N/A 11/15/2018   Procedure: ABDOMINAL AORTOGRAM;  Surgeon: [name], MD;  Location: [place] [facility];  Service: Cardiovascular;  Laterality: N/A;  . BVT  9/50/93   Left  Basilic Vein Transposition  . CHOLECYSTECTOMY    . EYE SURGERY     Catarct bil  . INSERTION OF DIALYSIS CATHETER  05/28/2012   Procedure: INSERTION OF DIALYSIS CATHETER;  Surgeon: Mal Misty, MD;  Location: [place];  Service: Vascular;  Laterality: Right;  . Left arm shuntogram.    . Left forearm loop graft with 6 mm Gore-Tex graft.    . LOWER EXTREMITY ANGIOGRAPHY Bilateral 11/15/2018   Procedure: LOWER EXTREMITY  ANGIOGRAPHY;  Surgeon: [name], MD;  Location: [place] [facility];  Service: Cardiovascular;  Laterality: Bilateral;  . Pars plana vitrectomy with 25-gauge system    . PERIPHERAL VASCULAR ATHERECTOMY Left 11/15/2018   Procedure: PERIPHERAL VASCULAR ATHERECTOMY;  Surgeon: [name], MD;  Location: [place] [facility];  Service: Cardiovascular;  Laterality: Left;  SFA with STENT  . PERIPHERAL VASCULAR BALLOON ANGIOPLASTY Left 11/15/2018   Procedure: PERIPHERAL VASCULAR BALLOON ANGIOPLASTY;  Surgeon: [name],  MD;  Location: [place] [facility];  Service: Cardiovascular;  Laterality: Left;  PT   Family History Family History  Problem Relation Age of Onset  . Alzheimer's disease Mother   . Diabetes Father        Amputation:  bilateral legs  . Cancer Daughter        breast cancer  . Diabetes Son   . Heart disease Son        before age 24  . Hypertension Son   . Anesthesia problems Neg Hx     Social History Social History   Tobacco Use  . Smoking status: Former Smoker    Types: Cigarettes    Last attempt to quit: 06/30/2004    Years since quitting: 14.4  . Smokeless tobacco: Never Used  Substance Use Topics  . Alcohol use: No  . Drug use: No   Allergies Tape  Review of Systems Review of Systems  Unable to perform ROS: Mental status change    Physical Exam Vital Signs  I have reviewed the triage vital signs BP 90/68   Pulse 92   Temp 97.8 F (36.6 C) (Oral)   Resp (!) 21   Physical Exam  Constitutional: He appears well-developed and well-nourished. No distress.  HENT:  Head: Normocephalic and atraumatic.  Nose: Nose normal.  Eyes: Pupils are equal, round, and reactive to light. Conjunctivae and EOM are normal. Right eye exhibits no discharge. Left eye exhibits no discharge. No scleral icterus.  Neck: Normal range of motion. Neck supple.  Cardiovascular: Normal rate and regular rhythm. Exam reveals no gallop and no friction rub.  No murmur  heard. AV fistula with good thrill  Pulmonary/Chest: Effort normal and breath sounds normal. No stridor. No respiratory distress. He has no rales.  Abdominal: Soft. He exhibits no distension. There is no tenderness.  Musculoskeletal: He exhibits no edema or tenderness.  Neurological: He is alert. He is disoriented (oriented to self and place).  Skin: Skin is warm and dry. No rash noted. He is not diaphoretic. No erythema.  Psychiatric: He has a normal mood and affect.  Vitals reviewed.   ED Results and Treatments Labs (all labs ordered are listed, but only abnormal results are displayed) Labs Reviewed  CBC WITH DIFFERENTIAL/PLATELET - Abnormal; Notable for the following components:      Result Value   WBC 14.6 (*)    RBC 2.01 (*)    Hemoglobin 6.8 (*)    HCT 23.7 (*)    MCV 117.9 (*)    MCHC 28.7 (*)    RDW 27.2 (*)    nRBC 5.7 (*)    Neutro Abs 11.8 (*)    Monocytes Absolute 1.5 (*)    All other components within normal limits  COMPREHENSIVE METABOLIC PANEL - Abnormal; Notable for the following components:   Chloride 97 (*)    Glucose, Bld 128 (*)    BUN 82 (*)    Creatinine, Ser 6.56 (*)    Calcium 8.7 (*)    Albumin 2.9 (*)    AST 195 (*)    ALT 231 (*)    Alkaline Phosphatase 293 (*)    GFR calc non Af Amer 7 (*)    GFR calc Af Amer 9 (*)    Anion gap 21 (*)    All other components within normal limits  PROTIME-INR - Abnormal; Notable for the following components:   Prothrombin Time 29.8 (*)    All other components within normal limits  POC OCCULT BLOOD, ED  PREPARE RBC (CROSSMATCH)                                                                                                                         EKG  EKG Interpretation  Date/Time:    Ventricular Rate:    PR Interval:    QRS Duration:   QT Interval:    QTC Calculation:   R Axis:     Text Interpretation:        Radiology Ct Head Wo Contrast  Result Date: 12/02/2018 CLINICAL DATA:  77 year old  male with fall. EXAM: CT HEAD WITHOUT CONTRAST TECHNIQUE: Contiguous axial images were obtained from the base of the skull through the vertex without intravenous contrast. COMPARISON:  Head CT dated 10/20/2018 FINDINGS: Evaluation of this exam is limited due to motion artifact. Brain: There is moderate age-related atrophy and chronic microvascular ischemic changes. Left parietal old infarct noted. There is no acute intracranial hemorrhage. No mass effect or midline shift. No extra-axial fluid collection. Vascular: No hyperdense vessel or unexpected calcification. Skull: Normal. Negative for fracture or focal lesion. Sinuses/Orbits: The visualized paranasal sinuses are clear. Mild bilateral mastoid effusions noted. Other: None IMPRESSION: 1. No definite acute intracranial hemorrhage on this motion degraded exam. 2. Moderate age-related atrophy and chronic microvascular ischemic changes. Old left parietal infarct. Electronically Signed   By: Anner Crete M.D.   On: 12/02/2018 00:24   Pertinent labs & imaging results that were available during my care of the patient were reviewed by me and considered in my medical decision making (see chart for details).  Medications Ordered in ED Medications  sodium chloride 0.9 % bolus 500 mL (500 mLs Intravenous New Bag/Given 12/02/18 0042)  0.9 %  sodium chloride infusion (Manually program via Guardrails IV Fluids) (has no administration in time range)                                                                                                                                    Procedures Procedures CRITICAL CARE Performed by: Grayce Sessions Isaias Dowson Total critical care time: 55 minutes Critical care time was exclusive of separately billable procedures and treating other patients. Critical care was necessary to treat or prevent imminent or life-threatening deterioration. Critical care was time spent personally by me on the following activities: development of  treatment plan with patient and/or surrogate as well as nursing, discussions  with consultants, evaluation of patient's response to treatment, examination of patient, obtaining history from patient or surrogate, ordering and performing treatments and interventions, ordering and review of laboratory studies, ordering and review of radiographic studies, pulse oximetry and re-evaluation of patient's condition.   (including critical care time)  Medical Decision Making / ED Course I have reviewed the nursing notes for this encounter and the patient's prior records (if available in EHR or on provided paperwork).    Manual pressure as above.  No external evidence of trauma noted.  Given his anticoagulation, CT head was obtained and negative.  Labs notable for therapeutic INR with a hemoglobin of 6.8.   On review of records, patient was admitted several weeks ago and noted to have hemoglobin of 6.7 at that time requiring 2 units of packed red blood cells and iron infusion.  I did not see Hemoccult ordered at that time.  Patient was continued on his Coumadin.  Hemoccult obtained in positive.  Stool brown and not melenic.  Anemia multifactorial likely from slow GI bleed with superimposed chronic kidney disease.  2 units of packed red blood cells ordered.  Will transfuse slowly.  Will hold Coumadin.  No need for emergent reversal at this time.  Case discussed with medicine who will admit the patient for continued work-up and management.     Final Clinical Impression(s) / ED Diagnoses Final diagnoses:  Iron deficiency anemia due to chronic blood loss  Hypotension due to hypovolemia      This chart was dictated using voice recognition software.  Despite best efforts to proofread,  errors can occur which can change the documentation meaning.   Fatima Blank, MD 12/02/18 [phone]

## 2018-12-02 ENCOUNTER — Emergency Department (HOSPITAL_COMMUNITY): Payer: Medicare Other

## 2018-12-02 DIAGNOSIS — D500 Iron deficiency anemia secondary to blood loss (chronic): Secondary | ICD-10-CM | POA: Diagnosis present

## 2018-12-02 DIAGNOSIS — R195 Other fecal abnormalities: Secondary | ICD-10-CM

## 2018-12-02 DIAGNOSIS — E861 Hypovolemia: Secondary | ICD-10-CM | POA: Diagnosis present

## 2018-12-02 DIAGNOSIS — G4733 Obstructive sleep apnea (adult) (pediatric): Secondary | ICD-10-CM | POA: Diagnosis present

## 2018-12-02 DIAGNOSIS — I132 Hypertensive heart and chronic kidney disease with heart failure and with stage 5 chronic kidney disease, or end stage renal disease: Secondary | ICD-10-CM | POA: Diagnosis present

## 2018-12-02 DIAGNOSIS — D649 Anemia, unspecified: Secondary | ICD-10-CM

## 2018-12-02 DIAGNOSIS — I480 Paroxysmal atrial fibrillation: Secondary | ICD-10-CM | POA: Diagnosis present

## 2018-12-02 DIAGNOSIS — D62 Acute posthemorrhagic anemia: Secondary | ICD-10-CM | POA: Diagnosis present

## 2018-12-02 DIAGNOSIS — I9589 Other hypotension: Secondary | ICD-10-CM | POA: Diagnosis present

## 2018-12-02 DIAGNOSIS — E785 Hyperlipidemia, unspecified: Secondary | ICD-10-CM | POA: Diagnosis present

## 2018-12-02 DIAGNOSIS — K922 Gastrointestinal hemorrhage, unspecified: Secondary | ICD-10-CM | POA: Diagnosis present

## 2018-12-02 DIAGNOSIS — Z820 Family history of epilepsy and other diseases of the nervous system: Secondary | ICD-10-CM | POA: Diagnosis not present

## 2018-12-02 DIAGNOSIS — L8992 Pressure ulcer of unspecified site, stage 2: Secondary | ICD-10-CM | POA: Diagnosis present

## 2018-12-02 DIAGNOSIS — I272 Pulmonary hypertension, unspecified: Secondary | ICD-10-CM | POA: Diagnosis present

## 2018-12-02 DIAGNOSIS — E1152 Type 2 diabetes mellitus with diabetic peripheral angiopathy with gangrene: Secondary | ICD-10-CM | POA: Diagnosis present

## 2018-12-02 DIAGNOSIS — Z803 Family history of malignant neoplasm of breast: Secondary | ICD-10-CM | POA: Diagnosis not present

## 2018-12-02 DIAGNOSIS — F0390 Unspecified dementia without behavioral disturbance: Secondary | ICD-10-CM | POA: Diagnosis present

## 2018-12-02 DIAGNOSIS — R0902 Hypoxemia: Secondary | ICD-10-CM | POA: Diagnosis present

## 2018-12-02 DIAGNOSIS — N186 End stage renal disease: Secondary | ICD-10-CM | POA: Diagnosis present

## 2018-12-02 DIAGNOSIS — I350 Nonrheumatic aortic (valve) stenosis: Secondary | ICD-10-CM | POA: Diagnosis present

## 2018-12-02 DIAGNOSIS — I5030 Unspecified diastolic (congestive) heart failure: Secondary | ICD-10-CM | POA: Diagnosis present

## 2018-12-02 DIAGNOSIS — Z66 Do not resuscitate: Secondary | ICD-10-CM | POA: Diagnosis present

## 2018-12-02 DIAGNOSIS — N400 Enlarged prostate without lower urinary tract symptoms: Secondary | ICD-10-CM | POA: Diagnosis present

## 2018-12-02 DIAGNOSIS — E1122 Type 2 diabetes mellitus with diabetic chronic kidney disease: Secondary | ICD-10-CM | POA: Diagnosis present

## 2018-12-02 DIAGNOSIS — D631 Anemia in chronic kidney disease: Secondary | ICD-10-CM | POA: Diagnosis present

## 2018-12-02 DIAGNOSIS — Z79899 Other long term (current) drug therapy: Secondary | ICD-10-CM | POA: Diagnosis not present

## 2018-12-02 DIAGNOSIS — N2581 Secondary hyperparathyroidism of renal origin: Secondary | ICD-10-CM | POA: Diagnosis present

## 2018-12-02 LAB — COMPREHENSIVE METABOLIC PANEL
ALT: 231 U/L — ABNORMAL HIGH (ref 0–44)
AST: 195 U/L — ABNORMAL HIGH (ref 15–41)
Albumin: 2.9 g/dL — ABNORMAL LOW (ref 3.5–5.0)
Alkaline Phosphatase: 293 U/L — ABNORMAL HIGH (ref 38–126)
Anion gap: 21 — ABNORMAL HIGH (ref 5–15)
BUN: 82 mg/dL — ABNORMAL HIGH (ref 8–23)
CO2: 22 mmol/L (ref 22–32)
Calcium: 8.7 mg/dL — ABNORMAL LOW (ref 8.9–10.3)
Chloride: 97 mmol/L — ABNORMAL LOW (ref 98–111)
Creatinine, Ser: 6.56 mg/dL — ABNORMAL HIGH (ref 0.61–1.24)
GFR calc Af Amer: 9 mL/min — ABNORMAL LOW (ref >60)
GFR calc non Af Amer: 7 mL/min — ABNORMAL LOW (ref >60)
Glucose, Bld: 128 mg/dL — ABNORMAL HIGH (ref 70–99)
Potassium: 3.5 mmol/L (ref 3.5–5.1)
Sodium: 140 mmol/L (ref 135–145)
Total Bilirubin: 1.1 mg/dL (ref 0.3–1.2)
Total Protein: 6.6 g/dL (ref 6.5–8.1)

## 2018-12-02 LAB — CBC WITH DIFFERENTIAL/PLATELET
BASOS PCT: 0 %
Band Neutrophils: 0 %
Basophils Absolute: 0 10*3/uL (ref 0.0–0.1)
Blasts: 0 %
Eosinophils Absolute: 0.3 10*3/uL (ref 0.0–0.5)
Eosinophils Relative: 2 %
HCT: 23.7 % — ABNORMAL LOW (ref 39.0–52.0)
Hemoglobin: 6.8 g/dL — CL (ref 13.0–17.0)
Lymphocytes Relative: 7 %
Lymphs Abs: 1 10*3/uL (ref 0.7–4.0)
MCH: 33.8 pg (ref 26.0–34.0)
MCHC: 28.7 g/dL — ABNORMAL LOW (ref 30.0–36.0)
MCV: 117.9 fL — ABNORMAL HIGH (ref 80.0–100.0)
Metamyelocytes Relative: 0 %
Monocytes Absolute: 1.5 10*3/uL — ABNORMAL HIGH (ref 0.1–1.0)
Monocytes Relative: 10 %
Myelocytes: 0 %
NEUTROS ABS: 11.8 10*3/uL — AB (ref 1.7–7.7)
Neutrophils Relative %: 81 %
Other: 0 %
Platelets: 150 10*3/uL (ref 150–400)
Promyelocytes Relative: 0 %
RBC: 2.01 MIL/uL — ABNORMAL LOW (ref 4.22–5.81)
RDW: 27.2 % — ABNORMAL HIGH (ref 11.5–15.5)
WBC: 14.6 10*3/uL — ABNORMAL HIGH (ref 4.0–10.5)
nRBC: 0 /100 WBC
nRBC: 5.7 % — ABNORMAL HIGH (ref 0.0–0.2)

## 2018-12-02 LAB — PATHOLOGIST SMEAR REVIEW

## 2018-12-02 LAB — PROTIME-INR
INR: 2.88
Prothrombin Time: 29.8 seconds — ABNORMAL HIGH (ref 11.4–15.2)

## 2018-12-02 LAB — CBC
HEMATOCRIT: 31.9 % — AB (ref 39.0–52.0)
Hemoglobin: 9.7 g/dL — ABNORMAL LOW (ref 13.0–17.0)
MCH: 32.7 pg (ref 26.0–34.0)
MCHC: 30.4 g/dL (ref 30.0–36.0)
MCV: 107.4 fL — ABNORMAL HIGH (ref 80.0–100.0)
Platelets: 115 10*3/uL — ABNORMAL LOW (ref 150–400)
RBC: 2.97 MIL/uL — ABNORMAL LOW (ref 4.22–5.81)
RDW: 25.7 % — ABNORMAL HIGH (ref 11.5–15.5)
WBC: 14.4 10*3/uL — ABNORMAL HIGH (ref 4.0–10.5)
nRBC: 2.2 % — ABNORMAL HIGH (ref 0.0–0.2)

## 2018-12-02 LAB — PREPARE RBC (CROSSMATCH)

## 2018-12-02 LAB — POC OCCULT BLOOD, ED: Fecal Occult Bld: POSITIVE — AB

## 2018-12-02 LAB — MRSA PCR SCREENING: MRSA by PCR: NEGATIVE

## 2018-12-02 MED ORDER — SODIUM CHLORIDE 0.9% IV SOLUTION
Freq: Once | INTRAVENOUS | Status: AC
  Administered 2018-12-02: 10 mL/h via INTRAVENOUS

## 2018-12-02 MED ORDER — ONDANSETRON HCL 4 MG PO TABS: 4.0000 mg | ORAL_TABLET | Freq: Four times a day (QID) | ORAL | Status: DC | PRN

## 2018-12-02 MED ORDER — ACETAMINOPHEN 650 MG RE SUPP: 650.0000 mg | Freq: Four times a day (QID) | RECTAL | Status: DC | PRN

## 2018-12-02 MED ORDER — DOXERCALCIFEROL 4 MCG/2ML IV SOLN: 2.0000 ug | INTRAVENOUS | Status: DC

## 2018-12-02 MED ORDER — CHLORHEXIDINE GLUCONATE CLOTH 2 % EX PADS: 6.0000 | MEDICATED_PAD | Freq: Every day | CUTANEOUS | Status: DC

## 2018-12-02 MED ORDER — CALCIUM ACETATE (PHOS BINDER) 667 MG PO CAPS
2668.0000 mg | ORAL_CAPSULE | Freq: Three times a day (TID) | ORAL | Status: DC
  Administered 2018-12-02 – 2018-12-04 (×5): 2668 mg via ORAL
  Filled 2018-12-02 (×4): qty 4

## 2018-12-02 MED ORDER — PANTOPRAZOLE SODIUM 40 MG PO TBEC
40.0000 mg | DELAYED_RELEASE_TABLET | Freq: Two times a day (BID) | ORAL | Status: DC
  Administered 2018-12-03 – 2018-12-04 (×3): 40 mg via ORAL
  Filled 2018-12-02 (×4): qty 1

## 2018-12-02 MED ORDER — POLYETHYLENE GLYCOL 3350 17 G PO PACK: 17.0000 g | PACK | Freq: Every day | ORAL | Status: DC | PRN

## 2018-12-02 MED ORDER — MUPIROCIN CALCIUM 2 % EX CREA
TOPICAL_CREAM | Freq: Two times a day (BID) | CUTANEOUS | Status: DC
  Administered 2018-12-03 – 2018-12-04 (×2): via TOPICAL
  Filled 2018-12-02 (×2): qty 15

## 2018-12-02 MED ORDER — FENTANYL CITRATE (PF) 100 MCG/2ML IJ SOLN
INTRAMUSCULAR | Status: AC
  Filled 2018-12-02: qty 2

## 2018-12-02 MED ORDER — SODIUM CHLORIDE 0.9 % IV SOLN
125.0000 mg | INTRAVENOUS | Status: DC
  Administered 2018-12-03: 125 mg via INTRAVENOUS
  Filled 2018-12-02 (×2): qty 10

## 2018-12-02 MED ORDER — FENTANYL CITRATE (PF) 100 MCG/2ML IJ SOLN
12.5000 ug | INTRAMUSCULAR | Status: DC | PRN
  Administered 2018-12-02: 25 ug via INTRAVENOUS

## 2018-12-02 MED ORDER — MIDODRINE HCL 5 MG PO TABS
10.0000 mg | ORAL_TABLET | Freq: Once | ORAL | Status: AC
  Administered 2018-12-02: 10 mg via ORAL
  Filled 2018-12-02: qty 2

## 2018-12-02 MED ORDER — ONDANSETRON HCL 4 MG/2ML IJ SOLN: 4.0000 mg | Freq: Four times a day (QID) | INTRAMUSCULAR | Status: DC | PRN

## 2018-12-02 MED ORDER — ACETAMINOPHEN 325 MG PO TABS
650.0000 mg | ORAL_TABLET | Freq: Four times a day (QID) | ORAL | Status: DC | PRN
  Administered 2018-12-04: 650 mg via ORAL
  Filled 2018-12-02 (×2): qty 2

## 2018-12-02 MED ORDER — PANTOPRAZOLE SODIUM 40 MG IV SOLR
40.0000 mg | Freq: Once | INTRAVENOUS | Status: AC
  Administered 2018-12-02: 40 mg via INTRAVENOUS
  Filled 2018-12-02: qty 40

## 2018-12-02 NOTE — Consult Note (Addendum)
[place] Nurse wound consult note Reason for Consult: Consult requested for BLE.  Pt has been followed in the past by the vascular team for gangrene of the left 5th toe, according to progress notes. Wound type: Left heel with stage 2 pressure injury; .8X.8X.1cm, pink and dry Right heel with darker-colored deep tissue injury; 3X3cm Left great toe with full thickness wound to anterior area; .2X.2X.1cm, yellow and dry, generalized edema and erythemia to great toe Left 5th toe with dry eschar to entire toe, has opened to a full thickness wound in the center; .5X.5X.1cm, red and dry, no odor or drainage. Pressure Injury POA: Yes Plan: Float heels to reduce pressure.  Foam dressing to protect and promote healing.  Xeroform gauze to left 5th toe.  Bactroban to promote moist healing to great toe.  No family present to discuss plan of care and patient does not appear to understand. He should follow-up with the vascular team after discharge for continued assessment of wounds. Please re-consult if further assistance is needed.  Thank-you,  Julien Girt MSN, [place], [place], [place], [place]

## 2018-12-02 NOTE — Consult Note (Signed)
[facility] Renal Consultation Note    Indication for Consultation:  Management of ESRD/hemodialysis; anemia, hypertension/volume and secondary hyperparathyroidism  YFV:[id number], Noah Delaine, MD  HPI: [name] is a 77 y.o. male. 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  Of note rounding notes from provider at dialysis indicate worsening dementia over the last 6 months with ongoing altered mental status since last admission.   Patient presented to the ED following a fall in the NH.  Seen and examined at bedside.  Patient is poor historian, oriented to name and place.  Believes he was brought to hospital today from dialysis.  States he has had fever, chills, n/v, and weakness for at least a few days.  Reports pain in left foot x 26months, worsened over the last week.  Denies CP, SOB, and HA.  No family at bedside to provide additional information.   Patient has been admitted due to anemia.  Pertinent findings include Hgb 6.7, +FOBT, (s/p 2 units pRBC in ED), CT negative for acute findings.     Past Medical History:  Diagnosis Date  . A-fib ([place])   . Anemia   . Blood transfusion   . BPH (benign prostatic hyperplasia)   . CHF (congestive heart failure) ([place])   . Diarrhea   . DM (diabetes mellitus) ([place])   . ESRD on hemodialysis ([place])    Started dialysis in 2009  . History of GI bleed    secondary to coumadin  . HTN (hypertension)   . Hyperlipidemia   . OSA (obstructive sleep apnea)    uses CPAP  . Secondary hyperparathyroidism of renal origin [facility])    Past Surgical History:  Procedure Laterality Date  . ABDOMINAL AORTOGRAM N/A 11/15/2018   Procedure: ABDOMINAL AORTOGRAM;  Surgeon: [name], MD;  Location: [place] [facility];  Service: Cardiovascular;  Laterality: N/A;  . BVT  1/63/84   Left  Basilic Vein Transposition  . CHOLECYSTECTOMY    . EYE SURGERY     Catarct bil  . INSERTION OF DIALYSIS CATHETER  05/28/2012   Procedure: INSERTION OF DIALYSIS CATHETER;  Surgeon: Mal Misty, MD;  Location: [place];  Service: Vascular;  Laterality: Right;  . Left arm shuntogram.    . Left forearm loop graft with 6 mm Gore-Tex graft.    . LOWER EXTREMITY ANGIOGRAPHY Bilateral 11/15/2018   Procedure: LOWER EXTREMITY ANGIOGRAPHY;  Surgeon: [name], MD;  Location: [place] [facility];  Service: Cardiovascular;  Laterality: Bilateral;  . Pars plana vitrectomy with 25-gauge system    . PERIPHERAL VASCULAR ATHERECTOMY Left 11/15/2018   Procedure: PERIPHERAL VASCULAR ATHERECTOMY;  Surgeon: [name], MD;  Location: [place] [facility];  Service: Cardiovascular;  Laterality: Left;  SFA with STENT  . PERIPHERAL VASCULAR BALLOON ANGIOPLASTY Left 11/15/2018   Procedure: PERIPHERAL VASCULAR BALLOON ANGIOPLASTY;  Surgeon: [name], MD;  Location: [place] [facility];  Service: Cardiovascular;  Laterality: Left;  PT   Family History  Problem Relation Age of Onset  . Alzheimer's disease Mother   . Diabetes Father        Amputation:  bilateral legs  . Cancer Daughter        breast  cancer  . Diabetes Son   . Heart disease Son        before age 36  . Hypertension Son   . Anesthesia problems Neg Hx    Social History:  reports that he quit smoking about 14 years ago. His smoking use included cigarettes. He has never used smokeless tobacco. He reports that he does not drink alcohol or use drugs. Allergies  Allergen Reactions  . Tape Itching and Other (See Comments)    Cloth tape only   Prior to Admission medications   Medication Sig Start Date End Date Taking? Authorizing Provider  acetaminophen (TYLENOL) 500 MG tablet Take 1,000 mg by mouth 3 (three) times daily.     [provider]  Amino Acids-Protein Hydrolys (FEEDING SUPPLEMENT, PRO-STAT SUGAR FREE 64,) LIQD Take 30 mLs by mouth daily. Patient taking differently: Take 30 mLs by mouth every evening.  11/17/18 12/17/18  Arrien, Jimmy Picket, MD  atorvastatin (LIPITOR) 20 MG tablet Take 1 tablet (20 mg total) by mouth daily at 6 PM. 11/17/18 12/17/18  Arrien, Jimmy Picket, MD  loperamide (IMODIUM A-D) 2 MG tablet Take 4 mg by mouth See admin instructions. Give 4 mg after 1st loose stool as needed for diarrhea    [provider]  metoprolol tartrate (LOPRESSOR) 25 MG tablet Take 25 mg by mouth 2 (two) times daily. Do not give before dialysis Mon, Wed, and Fri    [provider]  midodrine (PROAMATINE) 10 MG tablet Take 10 mg by mouth every Monday, Wednesday, and Friday. 30 minutes prior to dialysis treatment    [provider]  multivitamin (RENA-VIT) TABS tablet Take 1 tablet by mouth at bedtime. Patient taking differently: Take 1 tablet by mouth daily.  11/17/18 12/17/18  Arrien, Jimmy Picket, MD  warfarin (COUMADIN) 5 MG tablet Take 1 tablet (5 mg total) by mouth daily at 6 PM. Patient not taking: Reported on 12/01/2018 11/17/18 12/17/18  Arrien, Jimmy Picket, MD   Current Facility-Administered Medications  Medication Dose Route Frequency Provider Last Rate Last Dose  . acetaminophen (TYLENOL) tablet 650 mg  650 mg Oral Q6H PRN Lady Deutscher, MD       Or  . acetaminophen (TYLENOL) suppository 650 mg  650 mg Rectal Q6H PRN Lady Deutscher, MD      . Chlorhexidine Gluconate Cloth 2 % PADS 6 each  6 each Topical Q0600 [place], [place]      . fentaNYL (SUBLIMAZE) 100 MCG/2ML injection           . fentaNYL (SUBLIMAZE) injection 12.5-25 mcg  12.5-25 mcg Intravenous Q2H PRN Opyd, Ilene Qua, MD   25 mcg at 12/02/18 0430  . ondansetron (ZOFRAN) tablet 4 mg  4 mg Oral Q6H PRN Lady Deutscher, MD       Or  . ondansetron [facility]) injection 4 mg  4 mg  Intravenous Q6H PRN Lady Deutscher, MD      . polyethylene glycol (MIRALAX / GLYCOLAX) packet 17 g  17 g Oral Daily PRN Lady Deutscher, MD       Labs: Basic Metabolic Panel: Recent Labs  Lab 12/01/18 2353  NA 140  K 3.5  CL 97*  CO2 22  GLUCOSE 128*  BUN 82*  CREATININE 6.56*  CALCIUM 8.7*   Liver Function Tests: Recent Labs  Lab 12/01/18 2353  AST 195*  ALT 231*  ALKPHOS 293*  BILITOT 1.1  PROT 6.6  ALBUMIN 2.9*   CBC: Recent Labs  Lab 12/01/18 2353 12/02/18 0855  WBC 14.6* 14.4*  NEUTROABS 11.8*  --   HGB 6.8* 9.7*  HCT 23.7* 31.9*  MCV 117.9* 107.4*  PLT 150 115*   Studies/Results: Ct Head Wo Contrast  Result Date: 12/02/2018 CLINICAL DATA:  77 year old male with fall. EXAM: CT HEAD WITHOUT CONTRAST TECHNIQUE: Contiguous axial images were obtained from the base of the skull through the vertex without intravenous contrast. COMPARISON:  Head CT dated 10/20/2018 FINDINGS: Evaluation of this exam is limited due to motion artifact. Brain: There is moderate age-related atrophy and chronic microvascular ischemic changes. Left parietal old infarct noted. There is no acute intracranial hemorrhage. No mass effect or midline shift. No extra-axial fluid collection. Vascular: No hyperdense vessel or unexpected calcification. Skull: Normal. Negative for fracture or focal lesion. Sinuses/Orbits: The visualized paranasal sinuses are clear. Mild bilateral mastoid effusions noted. Other: None IMPRESSION: 1. No definite acute intracranial hemorrhage on this motion degraded exam. 2. Moderate age-related atrophy and chronic microvascular ischemic changes. Old left parietal infarct. Electronically Signed   By: Anner Crete M.D.   On: 12/02/2018 00:24    ROS: All others negative except those listed in HPI.  Physical Exam: Vitals:   12/02/18 0721 12/02/18 0815 12/02/18 0949 12/02/18 1041  BP: (!) 122/98 104/61 (!) 110/58 114/68  Pulse: 90 100 85 80  Resp: 16 (!) 21 18 18    Temp:    98.2 F (36.8 C)  TempSrc:    Oral  SpO2: 100% 99% 100%      General:NAD, chronically ill appearing male, laying in bed Head: NCAT, sclera not icteric, MMM Neck: Supple. No lymphadenopathy Lungs: CTA bilaterally. No wheeze, rales or rhonchi. Breathing is unlabored. Heart: RRR. +5/6 systolic murmur, no rubs or gallops.  Abdomen: soft, nontender, +BS, no guarding, no rebound tenderness Lower extremities:no edema, L foot wrapped, +yellowish drainage  Neuro: AAOx2 - person and place. Moves all extremities spontaneously. Dialysis Access: LU AVF+b/t  Dialysis Orders:  MWF - [facility]  4hrs, BFR 400, DFR AF 1.5,  EDW 69.5kg, 2K/ 2.25Ca  Access: AVF  Heparin 2500 Unit bolus Mircera 75 mcg q2wks - last 11/26 Venofer 100mg  IV qHD x 10 (4 completed) Hectorol 33mcg IV qHD     Assessment/Plan: 1.  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  +FOBT.  Coumadin held.  Holding Heparin with HD today.  Hgb drop over the last week, baseline has been around 9.0 and drop to 7.2 on 12/4. ESA dosed last week. Continue IV iron. 2.  ESRD -  On HD MWF.  K 3.5. Orders written for HD today per regular schedule using 3K bath.  Holding Heparin due to potential GIB. 3.  Hypotension/volume  - BP chronically low.  On midodrine with HD. Does not appear volume overloaded on exam. Titrate down as tolerated.  4.  Secondary Hyperparathyroidism -  Ca at goal. Phos elevated as OP.  Continue VDRA and binders.   5.  Nutrition - Renal diet w/fluid restriction.  Alb 2.9. Nepro.  6. DMT2 - per primary 7. CHF, diastolic dysfunction, A fib, aortic stenosis - metoprolol and Coumadin held  8. Hx acute CVA in November 9. Dementia 10. 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

## 2018-12-02 NOTE — Progress Notes (Signed)
Admission note:  Arrival Method: Patient arrived from ED on stretcher. Mental Orientation: Alert to self but confused needs reinforcement. Telemetry: N/A Assessment: see doc flow sheets. Skin: dry, diabetic ulcer left 5th toe, partial thickness loss on right heel which is healing. Healing scabs noted all over the body. IV: Right FA saline lock. Pain: Denies any pain currently. Tubes: condom cath placed. Safety Measures: Bed in low position, call bell and phone within reach. Fall Prevention Safety Plan: Reviewed the plan, needs reinforcement. Admission Screening: in process. 6700 Orientation: Patient has been oriented to the unit, staff and to the room.

## 2018-12-02 NOTE — Progress Notes (Signed)
Notified Ulice Dash, pt's primary RN, that HD treatment has been rescheduled for 12/03/2018 by Dr Posey Pronto

## 2018-12-02 NOTE — Progress Notes (Signed)
Left message to the daughter regarding CPAP machine.

## 2018-12-02 NOTE — ED Notes (Signed)
Per pharmacy, okay to crush Midodrine and administer in apple sauce

## 2018-12-02 NOTE — ED Notes (Signed)
Pt cognition continues to be altered at this time. Patient pulling at lines and cords. Difficult to redirect at this time.

## 2018-12-02 NOTE — Progress Notes (Signed)
Patients bed alarm was activated and when [place], NT and I Cardell [place]ach, RN) entered the patients room, the patient had his knees on the floor mat beside the bed with his elbows on the bed in a praying position. When we asked the patient what happened he stated, "I don't know." On initial assessment upon admission to unit, patient was confused. Patient assessed and no injuries were observed or stated by the patient. No complaints of pain. Vital signs stable. Patient was asked if he had to use the bathroom. Patient denied. Patient returned to bed. Bed alarm re-activated. Call bell within reach and patient re-educated on the importance of calling before getting up. MD notified and made aware. No further orders. Will continue to monitor.

## 2018-12-02 NOTE — H&P (Signed)
History and Physical    YOHAN SAMONS QQI:[id number] DOB: 11-Aug-1941 DOA: 12/01/2018  PCP: Hennie Duos, MD  Patient coming from:   I have personally briefly reviewed patient's old medical records in [place]  Chief Complaint:   HPI: CADENCE HASLAM is a 77 y.o. male with medical history significant of 2 fibrillation on warfarin, end-stage renal disease on dialysis Monday Wednesday Friday, history of GI bleeding who presented to the emergency department after being found down in the skilled nursing facility next to his bed.  On arrival of EMS was found to be in the bed he had been returned to the bed.  He was confused and his daughter reports no significant history of dementia.  Blood pressure was noted to be low with systolics in the 94R and he was hypoxic at the skilled facility and was off oxygen.  We are unsure as to what his oxygen requirement was.  Was placed on 3 L of oxygen and brought into the emergency department.  Manger of the history and physical examination is limited due to his current condition. ED Course: Patient noted to be anticoagulated on warfarin.  Several weeks ago he was admitted with a hemoglobin of 6.7 and received 2 units of packed red blood cells.  Coumadin was continued.  The review of records shows the patient had been restarted on his Coumadin in mid November 2019 after a stroke.  Recommendation was made by neurology due to severity of his cerebrovascular disease and multiple strokes.  Hemoccult was positive in the emergency department.  Patient was felt to be anemic due to slow GI bleed and chronic kidney disease and 2 units of packed red blood cells were ordered and transfused he was referred to me for further evaluation and management.  Of note patient's warfarin has been on hold since third due to planned surgery on Tuesday of this upcoming week for foot wet gangrene of the fifth toe  Review of Systems: Unable to give accurate history  Past Medical  History:  Diagnosis Date  . A-fib ([place])   . Anemia   . Blood transfusion   . BPH (benign prostatic hyperplasia)   . CHF (congestive heart failure) ([place])   . Diarrhea   . DM (diabetes mellitus) ([place])   . ESRD on hemodialysis ([place])    Started dialysis in 2009  . History of GI bleed    secondary to coumadin  . HTN (hypertension)   . Hyperlipidemia   . OSA (obstructive sleep apnea)    uses CPAP  . Secondary hyperparathyroidism of renal origin [facility])     Past Surgical History:  Procedure Laterality Date  . ABDOMINAL AORTOGRAM N/A 11/15/2018   Procedure: ABDOMINAL AORTOGRAM;  Surgeon: [name], MD;  Location: [place] [facility];  Service: Cardiovascular;  Laterality: N/A;  . BVT  7/40/81   Left  Basilic Vein Transposition  . CHOLECYSTECTOMY    . EYE SURGERY     Catarct bil  . INSERTION OF DIALYSIS CATHETER  05/28/2012   Procedure: INSERTION OF DIALYSIS CATHETER;  Surgeon: Mal Misty, MD;  Location: [place];  Service: Vascular;  Laterality: Right;  . Left arm shuntogram.    . Left forearm loop graft with 6 mm Gore-Tex graft.    . LOWER EXTREMITY ANGIOGRAPHY Bilateral 11/15/2018   Procedure: LOWER EXTREMITY ANGIOGRAPHY;  Surgeon: [name], MD;  Location: [place] [facility];  Service: Cardiovascular;  Laterality: Bilateral;  . Pars plana vitrectomy  with 25-gauge system    . PERIPHERAL VASCULAR ATHERECTOMY Left 11/15/2018   Procedure: PERIPHERAL VASCULAR ATHERECTOMY;  Surgeon: [name], MD;  Location: [place] [facility];  Service: Cardiovascular;  Laterality: Left;  SFA with STENT  . PERIPHERAL VASCULAR BALLOON ANGIOPLASTY Left 11/15/2018   Procedure: PERIPHERAL VASCULAR BALLOON ANGIOPLASTY;  Surgeon: [name], MD;  Location: [place] [facility];  Service: Cardiovascular;  Laterality: Left;  PT    Social History   Social History Narrative     The patient is a former smoker, quit is 2005.  Does not     drink or abuse drugs.  Currently, lives alone at  home.           reports that he quit smoking about 14 years ago. His smoking use included cigarettes. He has never used smokeless tobacco. He reports that he does not drink alcohol or use drugs.  Allergies  Allergen Reactions  . Tape Itching and Other (See Comments)    Cloth tape only    Family History  Problem Relation Age of Onset  . Alzheimer's disease Mother   . Diabetes Father        Amputation:  bilateral legs  . Cancer Daughter        breast cancer  . Diabetes Son   . Heart disease Son        before age 70  . Hypertension Son   . Anesthesia problems Neg Hx     Prior to Admission medications   Medication Sig Start Date End Date Taking? Authorizing Provider  acetaminophen (TYLENOL) 500 MG tablet Take 1,000 mg by mouth 3 (three) times daily.    [provider]  Amino Acids-Protein Hydrolys (FEEDING SUPPLEMENT, PRO-STAT SUGAR FREE 64,) LIQD Take 30 mLs by mouth daily. Patient taking differently: Take 30 mLs by mouth every evening.  11/17/18 12/17/18  Arrien, Jimmy Picket, MD  atorvastatin (LIPITOR) 20 MG tablet Take 1 tablet (20 mg total) by mouth daily at 6 PM. 11/17/18 12/17/18  Arrien, Jimmy Picket, MD  loperamide (IMODIUM A-D) 2 MG tablet Take 4 mg by mouth See admin instructions. Give 4 mg after 1st loose stool as needed for diarrhea    [provider]  metoprolol tartrate (LOPRESSOR) 25 MG tablet Take 25 mg by mouth 2 (two) times daily. Do not give before dialysis Mon, Wed, and Fri    [provider]  midodrine (PROAMATINE) 10 MG tablet Take 10 mg by mouth every Monday, Wednesday, and Friday. 30 minutes prior to dialysis treatment    [provider]  multivitamin (RENA-VIT) TABS tablet Take 1 tablet by mouth at bedtime. Patient taking differently: Take 1 tablet by mouth daily.  11/17/18 12/17/18  Arrien, Jimmy Picket, MD  warfarin (COUMADIN) 5 MG tablet Take 1 tablet (5 mg total) by mouth daily at 6 PM. Patient not taking:  Reported on 12/01/2018 11/17/18 12/17/18  Arrien, Jimmy Picket, MD    Physical Exam:  Constitutional: NAD, calm, comfortable Vitals:   12/02/18 0721 12/02/18 0815 12/02/18 0949 12/02/18 1041  BP: (!) 122/98 104/61 (!) 110/58 114/68  Pulse: 90 100 85 80  Resp: 16 (!) 21 18 18   Temp:    98.2 F (36.8 C)  TempSrc:    Oral  SpO2: 100% 99% 100%    Eyes: PERRL, lids and conjunctivae normal ENMT: Mucous membranes are moist. Posterior pharynx clear of any exudate or lesions.Normal dentition.  Neck: normal, supple, no masses, no thyromegaly Respiratory: clear to  auscultation bilaterally, no wheezing, no crackles. Normal respiratory effort. No accessory muscle use.  Cardiovascular:  rate and rhythm, no murmurs / rubs / gallops. No extremity edema. 2+ pedal pulses. No carotid bruits.  Abdomen: no tenderness, no masses palpated. No hepatosplenomegaly. Bowel sounds positive.  Musculoskeletal: no clubbing / cyanosis. No joint deformity upper and lower extremities. Good ROM, no contractures. Normal muscle tone.  Skin: no rashes, lesions, ulcers. No induration Neurologic: CN 2-12 grossly intact. Sensation intact, DTR normal. Strength 5/5 in all 4.  Psychiatric: Normal judgment and insight. Alert and oriented x 3. Normal mood.    Labs on Admission: I have personally reviewed following labs and imaging studies  CBC: Recent Labs  Lab 12/01/18 2353 12/02/18 0855  WBC 14.6* 14.4*  NEUTROABS 11.8*  --   HGB 6.8* 9.7*  HCT 23.7* 31.9*  MCV 117.9* 107.4*  PLT 150 712*   Basic Metabolic Panel: Recent Labs  Lab 12/01/18 2353  NA 140  K 3.5  CL 97*  CO2 22  GLUCOSE 128*  BUN 82*  CREATININE 6.56*  CALCIUM 8.7*   GFR: CrCl cannot be calculated (Unknown ideal weight.). Liver Function Tests: Recent Labs  Lab 12/01/18 2353  AST 195*  ALT 231*  ALKPHOS 293*  BILITOT 1.1  PROT 6.6  ALBUMIN 2.9*   No results for input(s): LIPASE, AMYLASE in the last 168 hours. No results for  input(s): AMMONIA in the last 168 hours. Coagulation Profile: Recent Labs  Lab 12/01/18 2353  INR 2.88   Urine analysis:    Component Value Date/Time   COLORURINE YELLOW 10/20/2008 [place] 10/20/2008 0942   LABSPEC 1.011 10/20/2008 0942   PHURINE 6.0 10/20/2008 0942   GLUCOSEU NEGATIVE 10/20/2008 0942   HGBUR LARGE (A) 10/20/2008 0942   BILIRUBINUR NEGATIVE 10/20/2008 0942   KETONESUR NEGATIVE 10/20/2008 0942   PROTEINUR 100 (A) 10/20/2008 0942   UROBILINOGEN 1.0 10/20/2008 0942   NITRITE NEGATIVE 10/20/2008 0942   LEUKOCYTESUR SMALL (A) 10/20/2008 0942    Radiological Exams on Admission: Ct Head Wo Contrast  Result Date: 12/02/2018 CLINICAL DATA:  77 year old male with fall. EXAM: CT HEAD WITHOUT CONTRAST TECHNIQUE: Contiguous axial images were obtained from the base of the skull through the vertex without intravenous contrast. COMPARISON:  Head CT dated 10/20/2018 FINDINGS: Evaluation of this exam is limited due to motion artifact. Brain: There is moderate age-related atrophy and chronic microvascular ischemic changes. Left parietal old infarct noted. There is no acute intracranial hemorrhage. No mass effect or midline shift. No extra-axial fluid collection. Vascular: No hyperdense vessel or unexpected calcification. Skull: Normal. Negative for fracture or focal lesion. Sinuses/Orbits: The visualized paranasal sinuses are clear. Mild bilateral mastoid effusions noted. Other: None IMPRESSION: 1. No definite acute intracranial hemorrhage on this motion degraded exam. 2. Moderate age-related atrophy and chronic microvascular ischemic changes. Old left parietal infarct. Electronically Signed   By: Anner Crete M.D.   On: 12/02/2018 00:24     Assessment/Plan Principal Problem:   Symptomatic anemia Active Problems:   Acute on chronic blood loss anemia   Lower GI bleeding   PAF (paroxysmal atrial fibrillation) (H[facility])   CHF (congestive heart failure) ([facility])    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[place])   OBSTRUCTIVE SLEEP APNEA   Hyperlipidemia   Hypertension, accelerated with heart disease, without CHF   Pulmonary HTN ([place])   DNR (do not resuscitate) discussion  1.  Symptomatic anemia: Patient hypotensive and falling likely related to anemia.  Hemoglobin  found to be 6.8 on November 21 his hemoglobin was 8.7.  His last colonoscopy was in 2012 that showed rare diverticuli.  He has a history of lower GI bleeding and warfarin had been held until mid November when he came in with a stroke and was restarted at the request of neurology.  Unfortunately patient has failed this trial and at this point I believe we will need to discontinue use of warfarin for his atrial fibrillation as he continues to have GI bleeding.  Transfuse 2 units of packed red blood cells.  2.  Acute on chronic blood loss anemia patient for transfusion as above.  3.  Lower GI bleeding: I have spoken to GI who will see the patient.  He likely needs a repeat endoscopy.  4.  Excisional atrial fibrillation: Patient has been anticoagulated but has failed this trial.  We will continue with rate control but he can no longer be anticoagulated due to receiving significant blood transfusions this fall from anemia.  5.  Congestive heart failure: Echocardiogram from November 08, 9621: Systolic function with mild concentric hypertrophy.  Moderate atrial stenosis.  Pulmonary artery peak pressure of 67.  To new beta-blocker.  Patient not on uretic as he gets dialysis does not make urine.  6.  Diabetic wet gangrene of the foot: Patient scheduled for surgery on Tuesday of the left foot.  7.  Obstructive sleep apnea: We will request the family bring in patient's CPAP machine.  8.  Hyperlipidemia: Continue home medication regimen.  9.  Accelerated hypertension without congestive heart failure: Home medications restarted.  Dialysis is pending.  10.  Pulmonary hypertension: Last pulmonary artery pressure  noted elevated at 67.  Patient unable to take a diuretic.  We will continue to monitor.  11.  DO NOT RESUSCITATE.  DVT prophylaxis: SCDs Code Status: DO NOT RESUSCITATE Family Communication: Personally called and spoke to the patient's daughter by telephone. Disposition Plan: Back to skilled facility [facility] in 24 to 48 hours Consults called: GI, Maryanna Shape on-call Admission status: Observation   Lady Deutscher MD [facility] Pager [phone]  If 7PM-7AM, please contact night-coverage [email or web address] Password [email or web address]  12/02/2018, 2:23 PM

## 2018-12-02 NOTE — Consult Note (Addendum)
Referring Provider:  Dr. Evangeline Gula, [facility] Primary Care Physician:  Hennie Duos, MD Primary Gastroenterologist:  Dr. Watt Climes previously but patient was dismissed  Reason for Consultation:  Anemia, heme positive stool  HPI: [name] is a 77 y.o. 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  He was confused and his daughter reported no significant history of dementia.  Blood pressure was noted to be low with systolics in the 73A.  Was brought to the ED where he was noted to have a Hgb of 6.8 grams so he was transfused with 2 units PRBC's and admitted.  He was heme positive as well.  Hgb up to 9.7 grams currently.  Several weeks ago he was admitted with a hemoglobin of 6.7 grams and received 2 units of packed red blood cells.    It was reported to me that he's had issues with GI bleeding in the past so coumadin had been discontinued until just recently when he had a stroke in November at which point it was restarted as recommended by neurology (recommended due to severe cerebrovascular disease and multiple CVA's).  The only GI evaluation that I can see in Epic is a colonoscopy from 2012, however.  That was performed by Dr. Amedeo Plenty and showed mild diverticular disease and hemorrhoids.    His nurse reports that he had a BM today that was green in color.  Patient denies abdominal pain.  No family present at the time of my visit.  His nurse said that she had just tried to contact the daughter with no answer.  Of note patient's warfarin has been on hold since 12/3 due to planned surgery on Tuesday of this upcoming week for gangrene of the fifth toe on the left.  I also noted that he has elevated LFT's with normal total bili, ALP 293, AST 195, and ALT 231.  It appears that they were  normal 3 weeks ago except an elevated ALP to 197.  Had normal liver on ultrasound in October.   Past Medical History:  Diagnosis Date  . A-fib ([place])   . Anemia   . Blood transfusion   . BPH (benign prostatic hyperplasia)   . CHF (congestive heart failure) ([place])   . Diarrhea   . DM (diabetes mellitus) ([place])   . ESRD on hemodialysis ([place])    Started dialysis in 2009  . History of GI bleed    secondary to coumadin  . HTN (hypertension)   . Hyperlipidemia   . OSA (obstructive sleep apnea)    uses CPAP  . Secondary hyperparathyroidism of renal origin [facility])     Past Surgical History:  Procedure Laterality Date  . ABDOMINAL AORTOGRAM N/A 11/15/2018   Procedure: ABDOMINAL AORTOGRAM;  Surgeon: [name], MD;  Location: [place] [facility];  Service: Cardiovascular;  Laterality: N/A;  . BVT  1/93/79   Left  Basilic Vein Transposition  . CHOLECYSTECTOMY    . EYE SURGERY     Catarct bil  . INSERTION OF DIALYSIS CATHETER  05/28/2012   Procedure: INSERTION OF DIALYSIS CATHETER;  Surgeon: Mal Misty, MD;  Location: [place];  Service: Vascular;  Laterality: Right;  . Left arm shuntogram.    . Left forearm loop graft with 6 mm Gore-Tex graft.    . LOWER  EXTREMITY ANGIOGRAPHY Bilateral 11/15/2018   Procedure: LOWER EXTREMITY ANGIOGRAPHY;  Surgeon: [name], MD;  Location: [place] [facility];  Service: Cardiovascular;  Laterality: Bilateral;  . Pars plana vitrectomy with 25-gauge system    . PERIPHERAL VASCULAR ATHERECTOMY Left 11/15/2018   Procedure: PERIPHERAL VASCULAR ATHERECTOMY;  Surgeon: [name], MD;  Location: [place] [facility];  Service: Cardiovascular;  Laterality: Left;  SFA with STENT  . PERIPHERAL VASCULAR BALLOON ANGIOPLASTY Left 11/15/2018   Procedure: PERIPHERAL VASCULAR BALLOON ANGIOPLASTY;  Surgeon: [name], MD;  Location: [place] [facility];  Service: Cardiovascular;  Laterality: Left;  PT    Prior to Admission medications   Medication  Sig Start Date End Date Taking? Authorizing Provider  acetaminophen (TYLENOL) 500 MG tablet Take 1,000 mg by mouth 3 (three) times daily.    [provider]  Amino Acids-Protein Hydrolys (FEEDING SUPPLEMENT, PRO-STAT SUGAR FREE 64,) LIQD Take 30 mLs by mouth daily. Patient taking differently: Take 30 mLs by mouth every evening.  11/17/18 12/17/18  Arrien, Jimmy Picket, MD  atorvastatin (LIPITOR) 20 MG tablet Take 1 tablet (20 mg total) by mouth daily at 6 PM. 11/17/18 12/17/18  Arrien, Jimmy Picket, MD  loperamide (IMODIUM A-D) 2 MG tablet Take 4 mg by mouth See admin instructions. Give 4 mg after 1st loose stool as needed for diarrhea    [provider]  metoprolol tartrate (LOPRESSOR) 25 MG tablet Take 25 mg by mouth 2 (two) times daily. Do not give before dialysis Mon, Wed, and Fri    [provider]  midodrine (PROAMATINE) 10 MG tablet Take 10 mg by mouth every Monday, Wednesday, and Friday. 30 minutes prior to dialysis treatment    [provider]  multivitamin (RENA-VIT) TABS tablet Take 1 tablet by mouth at bedtime. Patient taking differently: Take 1 tablet by mouth daily.  11/17/18 12/17/18  Arrien, Jimmy Picket, MD  warfarin (COUMADIN) 5 MG tablet Take 1 tablet (5 mg total) by mouth daily at 6 PM. Patient not taking: Reported on 12/01/2018 11/17/18 12/17/18  Arrien, Jimmy Picket, MD    Current Facility-Administered Medications  Medication Dose Route Frequency Provider Last Rate Last Dose  . acetaminophen (TYLENOL) tablet 650 mg  650 mg Oral Q6H PRN Lady Deutscher, MD       Or  . acetaminophen (TYLENOL) suppository 650 mg  650 mg Rectal Q6H PRN Lady Deutscher, MD      . calcium acetate (PHOSLO) capsule 2,668 mg  2,668 mg Oral TID WC Penninger, Ria Comment, [place]      . Chlorhexidine Gluconate Cloth 2 % PADS 6 each  6 each Topical Q0600 Penninger, Ria Comment, [place]      . [START ON 12/05/2018] doxercalciferol (HECTOROL) injection 2 mcg  2 mcg  Intravenous Q M,W,F-HD Penninger, Ria Comment, PA      . fentaNYL (SUBLIMAZE) 100 MCG/2ML injection           . fentaNYL (SUBLIMAZE) injection 12.5-25 mcg  12.5-25 mcg Intravenous Q2H PRN Opyd, Ilene Qua, MD   25 mcg at 12/02/18 0430  . ferric gluconate (NULECIT) 125 mg in sodium chloride 0.9 % 100 mL IVPB  125 mg Intravenous Q M,W,F-HD Penninger, Ria Comment, PA      . mupirocin cream (BACTROBAN) 2 %   Topical BID Lady Deutscher, MD      . ondansetron [facility]) tablet 4 mg  4 mg Oral Q6H PRN Lady Deutscher, MD       Or  .  ondansetron (ZOFRAN) injection 4 mg  4 mg Intravenous Q6H PRN Lady Deutscher, MD      . polyethylene glycol (MIRALAX / GLYCOLAX) packet 17 g  17 g Oral Daily PRN Lady Deutscher, MD        Allergies as of 12/01/2018 - Review Complete 12/01/2018  Allergen Reaction Noted  . Tape Itching and Other (See Comments) 06/15/2012    Family History  Problem Relation Age of Onset  . Alzheimer's disease Mother   . Diabetes Father        Amputation:  bilateral legs  . Cancer Daughter        breast cancer  . Diabetes Son   . Heart disease Son        before age 52  . Hypertension Son   . Anesthesia problems Neg Hx     Social History   Socioeconomic History  . Marital status: Divorced    Spouse name: Not on file  . Number of children: Not on file  . Years of education: Not on file  . Highest education level: Not on file  Occupational History  . Not on file  Social Needs  . Financial resource strain: Not on file  . Food insecurity:    Worry: Not on file    Inability: Not on file  . Transportation needs:    Medical: Not on file    Non-medical: Not on file  Tobacco Use  . Smoking status: Former Smoker    Types: Cigarettes    Last attempt to quit: 06/30/2004    Years since quitting: 14.4  . Smokeless tobacco: Never Used  Substance and Sexual Activity  . Alcohol use: No  . Drug use: No  . Sexual activity: Never  Lifestyle  . Physical activity:    Days per  week: Not on file    Minutes per session: Not on file  . Stress: Not on file  Relationships  . Social connections:    Talks on phone: Not on file    Gets together: Not on file    Attends religious service: Not on file    Active member of club or organization: Not on file    Attends meetings of clubs or organizations: Not on file    Relationship status: Not on file  . Intimate partner violence:    Fear of current or ex partner: Not on file    Emotionally abused: Not on file    Physically abused: Not on file    Forced sexual activity: Not on file  Other Topics Concern  . Not on file  Social History Narrative     The patient is a former smoker, quit is 2005.  Does not     drink or abuse drugs.  Currently, lives alone at home.          Review of Systems: ROS is O/W negative except as mentioned in HPI.  Physical Exam: Vital signs in last 24 hours: Temp:  [97.7 F (36.5 C)-98.2 F (36.8 C)] 97.8 F (36.6 C) (12/06 1514) Pulse Rate:  [67-120] 79 (12/06 1514) Resp:  [12-22] 18 (12/06 1514) BP: (51-131)/(24-98) 131/64 (12/06 1514) SpO2:  [67 %-100 %] 97 % (12/06 1514) Last BM Date: 12/02/18 General:  Alert, chronically ill-appearing, in NAD. Head:  Normocephalic and atraumatic. Eyes:  Sclera clear, no icterus.  Conjunctiva pink. Ears:  Normal auditory acuity. Mouth:  No deformity or lesions.   Lungs:  Clear throughout to auscultation.  No wheezes, crackles,  or rhonchi.  Heart:  Regular rate and rhythm; no murmurs, clicks, rubs,  or gallops. Abdomen:  Soft, non-distended.  BS present.  Non-tender.  Rectal:  Deferred.  Heme positive in the ED.  Green stool reported by nurse.  Msk:  Symmetrical without gross deformities. Pulses:  Normal pulses noted. Extremities:  Chronic skin changes noted with wound dressing on both feet. Neurologic:  Alert.  Confused.  Trying to get out of bed.  Does not offer much information. Skin:  Intact without significant lesions or  rashes.  Intake/Output from previous day: 12/05 0701 - 12/06 0700 In: 1350 [Blood:350; IV Piggyback:1000] Out: -  Intake/Output this shift: Total I/O In: 615 [P.O.:300; Blood:315] Out: -   Lab Results: Recent Labs    12/01/18 2353 12/02/18 0855  WBC 14.6* 14.4*  HGB 6.8* 9.7*  HCT 23.7* 31.9*  PLT 150 115*   BMET Recent Labs    12/01/18 2353  NA 140  K 3.5  CL 97*  CO2 22  GLUCOSE 128*  BUN 82*  CREATININE 6.56*  CALCIUM 8.7*   LFT Recent Labs    12/01/18 2353  PROT 6.6  ALBUMIN 2.9*  AST 195*  ALT 231*  ALKPHOS 293*  BILITOT 1.1   PT/INR Recent Labs    12/01/18 2353  LABPROT 29.8*  INR 2.88   Studies/Results: Ct Head Wo Contrast  Result Date: 12/02/2018 CLINICAL DATA:  77 year old male with fall. EXAM: CT HEAD WITHOUT CONTRAST TECHNIQUE: Contiguous axial images were obtained from the base of the skull through the vertex without intravenous contrast. COMPARISON:  Head CT dated 10/20/2018 FINDINGS: Evaluation of this exam is limited due to motion artifact. Brain: There is moderate age-related atrophy and chronic microvascular ischemic changes. Left parietal old infarct noted. There is no acute intracranial hemorrhage. No mass effect or midline shift. No extra-axial fluid collection. Vascular: No hyperdense vessel or unexpected calcification. Skull: Normal. Negative for fracture or focal lesion. Sinuses/Orbits: The visualized paranasal sinuses are clear. Mild bilateral mastoid effusions noted. Other: None IMPRESSION: 1. No definite acute intracranial hemorrhage on this motion degraded exam. 2. Moderate age-related atrophy and chronic microvascular ischemic changes. Old left parietal infarct. Electronically Signed   By: Anner Crete M.D.   On: 12/02/2018 00:24    IMPRESSION:  *Acute on chronic anemia:  Thought in part due to CKD but is heme positive.  Anemia is certainly multifactorial.  He was just transfused 2 weeks ago and Hgb back down 2 grams again.   Green stool today per nursing.  If he lost 2 grams of blood via GI tract would expect to have seen some type of overt bleeding. *CKD/ESRD on HD M/W/F *History of GI bleeding issues in the past:  Unsure of the extent of this.  Only colonoscopy I have access to is from 2012. *Recent CVA in November when coumadin had been restarted. *Atrial fibrillation:  On coumadin until a couple of days ago in preparation for his surgery. *Gangrene of foot:  Coumadin currently on hold for left 5th toe amputation that had already been scheduled for next week. *Elevated LFT's:  Appears that they were normal 3 weeks ago except an elevated ALP to 197.  ? Due to ischemia/shock liver with hypotension (BP here on admission was 51/24).  Had normal liver on ultrasound in October.  PLAN: *Will give pantoprazole 40 mg BID for gastric mucosal protection. *Will reserve endoscopic evaluation for any significant/overt bleeding issue due to his multiple co-morbidities and high risk status. *Monitor Hgb  and transfuse prn. *Risk vs benefit needs to be discussed with family in regards to use of anticoagulation.  Does not sound like he's had any overt GI bleeding as of recent, but did in fact have CVA in November. *Trend LFT's.  Janett Billow D. Zehr  12/02/2018, 3:39 PM  GI ATTENDING  Complicated history,labs,radiology,prior colonoscopy report reviewed. Patient seen and examined. Agree with comprehensive consult note as outlined.  IMPRESSION: 1. Multifactorial anemia (ESRD,chronic disease, occult + stool). Macrocytic. Nothing to support iron deficiency or acute blood loss anemia. 2. Heme + stool. Greenish. No melena or hematochezia. Colonoscopy 2012 negative (tics,hems). Colonoscopy 2010 with small polyps. EGD 2010 unremarkable. No acute or subacute GI bleeding 3. Elevated LFTs. S/P cholecystectomy. Likely reactive. 3. Multiple medical problems  RECOMMENDATIONS: 1. PPI daily indefinitely to protect UGI mucosa 2. Transfuse as  needed 3. Hematology evaluation. Does this patient need other therapies for renal related anemia? Significance of macrocytosis? 4. No plans for endoscopy unless the patient were to develop acute bleeding. 5. No obvious reason from GI standpoint not no anticoagulate if medically important.  Call for questions or problems. Will sign off.  Docia Chuck. Geri Seminole., M.D. [facility] Division of Gastroenterology

## 2018-12-02 NOTE — ED Notes (Signed)
Unable to draw blood due to pt getting blood transfusion.

## 2018-12-03 DIAGNOSIS — D500 Iron deficiency anemia secondary to blood loss (chronic): Secondary | ICD-10-CM

## 2018-12-03 DIAGNOSIS — L8990 Pressure ulcer of unspecified site, unspecified stage: Secondary | ICD-10-CM

## 2018-12-03 LAB — RENAL FUNCTION PANEL
Albumin: 3 g/dL — ABNORMAL LOW (ref 3.5–5.0)
Anion gap: 20 — ABNORMAL HIGH (ref 5–15)
BUN: 96 mg/dL — ABNORMAL HIGH (ref 8–23)
CO2: 23 mmol/L (ref 22–32)
Calcium: 8.4 mg/dL — ABNORMAL LOW (ref 8.9–10.3)
Chloride: 100 mmol/L (ref 98–111)
Creatinine, Ser: 8.49 mg/dL — ABNORMAL HIGH (ref 0.61–1.24)
GFR calc Af Amer: 6 mL/min — ABNORMAL LOW (ref >60)
GFR calc non Af Amer: 5 mL/min — ABNORMAL LOW (ref >60)
Glucose, Bld: 108 mg/dL — ABNORMAL HIGH (ref 70–99)
Phosphorus: 8.3 mg/dL — ABNORMAL HIGH (ref 2.5–4.6)
Potassium: 3.6 mmol/L (ref 3.5–5.1)
Sodium: 143 mmol/L (ref 135–145)

## 2018-12-03 LAB — TYPE AND SCREEN
ABO/RH(D): O POS
Antibody Screen: NEGATIVE
Unit division: 0
Unit division: 0

## 2018-12-03 LAB — BPAM RBC
BLOOD PRODUCT EXPIRATION DATE: 201912302359
Blood Product Expiration Date: 201912302359
ISSUE DATE / TIME: 201912060306
ISSUE DATE / TIME: 201912060547
Unit Type and Rh: 5100
Unit Type and Rh: 5100

## 2018-12-03 LAB — CBC
HCT: 30 % — ABNORMAL LOW (ref 39.0–52.0)
Hemoglobin: 9.4 g/dL — ABNORMAL LOW (ref 13.0–17.0)
MCH: 32.4 pg (ref 26.0–34.0)
MCHC: 31.3 g/dL (ref 30.0–36.0)
MCV: 103.4 fL — ABNORMAL HIGH (ref 80.0–100.0)
Platelets: 102 10*3/uL — ABNORMAL LOW (ref 150–400)
RBC: 2.9 MIL/uL — ABNORMAL LOW (ref 4.22–5.81)
RDW: 26.5 % — ABNORMAL HIGH (ref 11.5–15.5)
WBC: 11.9 10*3/uL — ABNORMAL HIGH (ref 4.0–10.5)
nRBC: 0.8 % — ABNORMAL HIGH (ref 0.0–0.2)

## 2018-12-03 MED ORDER — ALTEPLASE 2 MG IJ SOLR: 2.0000 mg | Freq: Once | INTRAMUSCULAR | Status: DC | PRN

## 2018-12-03 MED ORDER — SODIUM CHLORIDE 0.9 % IV SOLN: 100.0000 mL | INTRAVENOUS | Status: DC | PRN

## 2018-12-03 MED ORDER — ORAL CARE MOUTH RINSE
15.0000 mL | Freq: Two times a day (BID) | OROMUCOSAL | Status: DC
  Administered 2018-12-03 – 2018-12-04 (×3): 15 mL via OROMUCOSAL

## 2018-12-03 MED ORDER — SODIUM CHLORIDE 0.9 % IV SOLN
125.0000 mg | INTRAVENOUS | Status: AC
  Filled 2018-12-03: qty 10

## 2018-12-03 MED ORDER — LIDOCAINE HCL (PF) 1 % IJ SOLN: 5.0000 mL | INTRAMUSCULAR | Status: DC | PRN

## 2018-12-03 MED ORDER — HEPARIN SODIUM (PORCINE) 1000 UNIT/ML DIALYSIS: 1000.0000 [IU] | INTRAMUSCULAR | Status: DC | PRN

## 2018-12-03 MED ORDER — LIDOCAINE-PRILOCAINE 2.5-2.5 % EX CREA: 1.0000 "application " | TOPICAL_CREAM | CUTANEOUS | Status: DC | PRN

## 2018-12-03 MED ORDER — DOXERCALCIFEROL 4 MCG/2ML IV SOLN
INTRAVENOUS | Status: AC
  Administered 2018-12-03: 2 ug via INTRAVENOUS
  Filled 2018-12-03: qty 2

## 2018-12-03 MED ORDER — DOXERCALCIFEROL 4 MCG/2ML IV SOLN
2.0000 ug | INTRAVENOUS | Status: AC
  Administered 2018-12-03: 2 ug via INTRAVENOUS

## 2018-12-03 MED ORDER — PENTAFLUOROPROP-TETRAFLUOROETH EX AERO: 1.0000 "application " | INHALATION_SPRAY | CUTANEOUS | Status: DC | PRN

## 2018-12-03 MED ORDER — SODIUM CHLORIDE 0.9 % IV SOLN: 125.0000 mg | INTRAVENOUS | Status: DC

## 2018-12-03 NOTE — Procedures (Signed)
Patient seen on Hemodialysis. BP (!) 95/34   Pulse (!) 101   Temp (!) 97.5 F (36.4 C) (Oral)   Resp 16   Wt 83 kg   SpO2 100%   BMI 29.53 kg/m   QB 400, UF goal 3L Tolerating treatment without complaints at this time-- reports some dark stools overnight without abdominal pain.   Elmarie Shiley MD [facility] Office # [phone] Pager # [phone] 9:30 AM

## 2018-12-03 NOTE — Progress Notes (Addendum)
[facility] PROGRESS NOTE  [name] ZWC:[id number] DOB: November 05, 1941 DOA: 12/01/2018 PCP: Hennie Duos, MD   Narrative: 77 yr old male PAF [place] >4 [restart recently coumadin whena dmit for CVA]OSA ESRD MWF Diastolic HF htn hld dm ty 2 [no med snow] Recent admit 11/10-11/21 with acute cva complic by acute hypoxic resp failrue from vol overload Also h/o dry gangrene  L 5th toe-had Angio 11/19 [ stents palced]  Readmit 12/16 for confusion and hypoxia with sats in the 80's  A & Plan 1.  Symptomatic anemia: Patient hypotensive and falling likely related to anemia.  last colonoscopy was in 2012 that showed rare diverticuli.  He has a history of lower GI bleeding and warfarin had been held until mid November when he came in with a stroke and was restarted at the request of neurology.   GI has seen patient and do not think a scope is necessity since reports of stool are not black at this stage  2.  Acute on chronic blood loss anemia patient for transfusion as above hemoglobin now up to 9.4 from 6.8 Rpt labs in am Cont Nulecit 124 mg per renal  3.  Lower GI bleeding: likely slow GIB  no needs at this time for repeat endoscopy.  Not on Rate controlling agents at this time  4.  Paroxysmal atrial fibrillation: Patient has been anticoagulated and will need to weigh risk and benefit of transfusion re: stroke vs bleed risk Will resume comadin post-procedure this tuesday  5.  Congestive heart failure: Echocardiogram from November 08, 2352: Systolic function with mild concentric hypertrophy.   6.  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  7.  Obstructive sleep apnea: We will request the family bring in patient's CPAP machine.  8.  Hyperlipidemia: Continue home medication regimen.  9  Pulmonary hypertension: Last pulmonary artery pressure noted elevated at 67.  Patient unable to take a diuretic.   We will continue to monitor.  Hypotension-continue Midodrine with dialysis  Pressure injuries to heels-stable-wound input sought   DVT prophylaxis: resume coumadin  Code Status: full none-called daughter no answer   Family Communication: n   Disposition Plan: n    [name], MD  [facility] Direct contact: [phone] --Via amion app OR  --[email or web address]; password [email or web address]  7PM-7AM contact night coverage as above 12/03/2018, 2:00 PM  LOS: 1 day   Consultants:  n  Procedures:  n  Antimicrobials:  n  Interval history/Subjective: Awake alert slight confusion Can name his daughter however  No cp no fever no chills  Objective:  Vitals:  Vitals:   12/03/18 1119 12/03/18 1301  BP: (!) 100/42 109/69  Pulse: 98 88  Resp: 16 18  Temp: 98.2 F (36.8 C) 97.6 F (36.4 C)  SpO2: 98% 100%    Exam:  . eomi ncat no pallor no ict . cta b . abd soft . s1 s 2 irreg irreg . Le not swollen-noted gangrene to L little toe--heels bandaged   I have personally reviewed the following:  Bun /creat 96/8.4 [on HD] Alk P 293 AST/ALT 195/231 WBc 14-->11 Hemoglobin 9.7-->9.4   Scheduled Meds: . calcium acetate  2,668 mg Oral TID WC  . [START ON 12/05/2018] doxercalciferol  2 mcg Intravenous Q M,W,F-HD  . mouth rinse  15 mL Mouth Rinse BID  . mupirocin cream   Topical BID  . pantoprazole  40 mg Oral  BID   Continuous Infusions: . ferric gluconate (FERRLECIT/NULECIT) IV    . 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

## 2018-12-03 NOTE — Plan of Care (Signed)
  Problem: Pain Managment: Goal: General experience of comfort will improve Outcome: Progressing   Problem: Coping: Goal: Level of anxiety will decrease Outcome: Not Progressing   Problem: Clinical Measurements: Goal: Ability to maintain clinical measurements within normal limits will improve Outcome: Progressing

## 2018-12-04 LAB — CBC WITH DIFFERENTIAL/PLATELET
ABS IMMATURE GRANULOCYTES: 0.08 10*3/uL — AB (ref 0.00–0.07)
Basophils Absolute: 0 10*3/uL (ref 0.0–0.1)
Basophils Relative: 0 %
Eosinophils Absolute: 0.3 10*3/uL (ref 0.0–0.5)
Eosinophils Relative: 3 %
HCT: 30.6 % — ABNORMAL LOW (ref 39.0–52.0)
Hemoglobin: 9.5 g/dL — ABNORMAL LOW (ref 13.0–17.0)
Immature Granulocytes: 1 %
Lymphocytes Relative: 9 %
Lymphs Abs: 0.9 10*3/uL (ref 0.7–4.0)
MCH: 33 pg (ref 26.0–34.0)
MCHC: 31 g/dL (ref 30.0–36.0)
MCV: 106.3 fL — ABNORMAL HIGH (ref 80.0–100.0)
Monocytes Absolute: 1.3 10*3/uL — ABNORMAL HIGH (ref 0.1–1.0)
Monocytes Relative: 12 %
Neutro Abs: 7.8 10*3/uL — ABNORMAL HIGH (ref 1.7–7.7)
Neutrophils Relative %: 75 %
Platelets: 87 10*3/uL — ABNORMAL LOW (ref 150–400)
RBC: 2.88 MIL/uL — ABNORMAL LOW (ref 4.22–5.81)
RDW: 26.1 % — ABNORMAL HIGH (ref 11.5–15.5)
WBC: 10.4 10*3/uL (ref 4.0–10.5)
nRBC: 0.7 % — ABNORMAL HIGH (ref 0.0–0.2)

## 2018-12-04 LAB — RENAL FUNCTION PANEL
ALBUMIN: 2.6 g/dL — AB (ref 3.5–5.0)
Anion gap: 18 — ABNORMAL HIGH (ref 5–15)
BUN: 47 mg/dL — ABNORMAL HIGH (ref 8–23)
CO2: 24 mmol/L (ref 22–32)
Calcium: 8.6 mg/dL — ABNORMAL LOW (ref 8.9–10.3)
Chloride: 99 mmol/L (ref 98–111)
Creatinine, Ser: 5.53 mg/dL — ABNORMAL HIGH (ref 0.61–1.24)
GFR calc Af Amer: 11 mL/min — ABNORMAL LOW (ref >60)
GFR calc non Af Amer: 9 mL/min — ABNORMAL LOW (ref >60)
Glucose, Bld: 134 mg/dL — ABNORMAL HIGH (ref 70–99)
PHOSPHORUS: 4.5 mg/dL (ref 2.5–4.6)
Potassium: 3.6 mmol/L (ref 3.5–5.1)
Sodium: 141 mmol/L (ref 135–145)

## 2018-12-04 LAB — PROTIME-INR
INR: 1.57
Prothrombin Time: 18.6 seconds — ABNORMAL HIGH (ref 11.4–15.2)

## 2018-12-04 LAB — HEPATITIS B SURFACE ANTIGEN: Hepatitis B Surface Ag: NEGATIVE

## 2018-12-04 MED ORDER — WARFARIN SODIUM 5 MG PO TABS: 5.0000 mg | ORAL_TABLET | Freq: Every day | ORAL | 0 refills | Status: DC

## 2018-12-04 MED ORDER — FERROUS GLUCONATE 240 (27 FE) MG PO TABS: 240.0000 mg | ORAL_TABLET | Freq: Three times a day (TID) | ORAL | 0 refills | Status: DC

## 2018-12-04 MED ORDER — DOXERCALCIFEROL 4 MCG/2ML IV SOLN: 2.0000 ug | INTRAVENOUS | Status: DC

## 2018-12-04 MED ORDER — CALCIUM ACETATE (PHOS BINDER) 667 MG PO CAPS: 2668.0000 mg | ORAL_CAPSULE | Freq: Three times a day (TID) | ORAL | 0 refills | Status: DC

## 2018-12-04 MED ORDER — PANTOPRAZOLE SODIUM 40 MG PO TBEC: 40.0000 mg | DELAYED_RELEASE_TABLET | Freq: Two times a day (BID) | ORAL | 0 refills | Status: DC

## 2018-12-04 NOTE — Progress Notes (Signed)
Unable to complete admission D/T patients altered mental status.

## 2018-12-04 NOTE — Progress Notes (Signed)
Daughter, [name], called and spoke with about her father, [name], being discharged today. Discharge Instructions reviewed and questions answered.

## 2018-12-04 NOTE — Progress Notes (Signed)
12/04/18:  CSW received a phone call from RN about patient discharge. Patient is being discharged back to current SNF; [facility]. Nurse to call report to [phone] and will be reporting to room 100.   Domenic Schwab, MSW, [place]

## 2018-12-04 NOTE — Discharge Summary (Signed)
Physician Discharge Summary  [name] KXF:[id number] DOB: 1941/10/29 DOA: 12/01/2018  PCP: Hennie Duos, MD  Admit date: 12/01/2018 Discharge date: 12/04/2018  Time spent: 25 minutes  Recommendations for Outpatient Follow-up:  1. New medications of iron, phosphate binder, Hectorol, as well as Protonix given at discharge 2. Please resume Coumadin after procedure 12/11 as per vascular surgeon and get an INR to monitor levels of the same as an outpatient 3. Monitor for dark stool if any as an outpatient 4. 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[place])   Lower GI bleeding   Heme positive stool   Pressure injury of skin   Discharge Condition: Proved  Diet recommendation: Renal  Filed Weights   12/03/18 0708 12/03/18 1119  Weight: 83 kg 81 kg     History of present illness:  77 yr old male PAF [place] >4 [restart recently coumadin whena dmit for CVA][facility] ESRD MWF Diastolic HF htn hld dm ty 2 [no med snow] Recent admit 11/10-11/21 with acute cva complic by acute hypoxic resp failrue from vol overload Also h/o dry gangrene  L 5th toe-had Angio 11/19 [ stents palced]  Readmit 12/16 for confusion and hypoxia with sats in the 80's  Hospital Course:  1. Symptomatic anemia: Patient hypotensive and falling likely related to anemia. last colonoscopy was in 2012 that showed rare diverticuli. GI bleed caused Coumadin to be held in November but then came back with a stroke-was resumed on Coumadin and GI has seen patient and do not think a scope is necessity since reports of  stool are not black at this stage and his hemoglobin is stable after transfusion  2. Acute on chronic blood loss anemia patient for transfusion as above hemoglobin now up to 9.4 from 6.8 Rpt labs in am Prescribe iron 3 times daily on discharge  3. Lower GI bleeding: likely slow GIB no needs at this time for repeat endoscopy.   4. Paroxysmal atrial fibrillation: Patient has been anticoagulated and will need to weigh risk and benefit of transfusion re: stroke vs bleed risk-he is slightly tachycardic but rate controlled limited secondary to his hypotension as he is on Midodrine-we will resume metoprolol cautiously on discharge and may have to titrate the dose from 25 by the operative downwards based on findings Will resume comadin post-procedure this tuesday  5. Congestive heart failure: Echocardiogram from November 08, 6966: Systolic function with mild concentric hypertrophy.   6. 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  In Coumadin to resume after this time  7. Obstructive sleep apnea: We will request the family bring in patient's CPAP machine.  8. Hyperlipidemia: Continue home medication regimen.  9 Pulmonary hypertension: Last pulmonary artery pressure noted elevated at 67. Patient unable to take a diuretic. We will continue to monitor.  He may continue his CPAP at facility  Hypotension-continue Midodrine with dialysis  Pressure injuries to heels-stable-wound input sought   ESRD Monday Wednesday Friday-seen by nephrology and had dialysis on 12/7  Consultations:  Gastroenterology  Discharge Exam: Vitals:   12/04/18 0622 12/04/18 0758  BP: (!) 119/52 110/65  Pulse: 94 97  Resp: 20 18  Temp: 98.4 F (36.9 C) 98.3 F (36.8 C)  SpO2: 100% 100%    General: NCAT in no distress Cardiovascular: S1-S2 no murmur rub or gallop slightly tachycardic holosystolic  murmur Respiratory: Clinically clear no added sound abdomen soft eating breakfast Graft noted in left arm No lower extremity edema  Discharge Instructions    Allergies as of 12/04/2018      Reactions   Tape Itching, Other (See Comments)   Cloth tape only      Medication List    TAKE these medications   acetaminophen 500 MG tablet Commonly known as:  TYLENOL Take 1,000 mg by mouth 3 (three) times daily.   atorvastatin 20 MG tablet Commonly known as:  LIPITOR Take 1 tablet (20 mg total) by mouth daily at 6 PM.   calcium acetate 667 MG capsule Commonly known as:  PHOSLO Take 4 capsules (2,668 mg total) by mouth 3 (three) times daily with meals.   doxercalciferol 4 MCG/2ML injection Commonly known as:  HECTOROL Inject 1 mL (2 mcg total) into the vein every Monday, Wednesday, and Friday with hemodialysis. Start taking on:  12/05/2018   feeding supplement (PRO-STAT SUGAR FREE 64) Liqd Take 30 mLs by mouth daily. What changed:  when to take this   ferrous gluconate 240 (27 FE) MG tablet Commonly known as:  FERGON Take 1 tablet (240 mg total) by mouth 3 (three) times daily with meals.   loperamide 2 MG tablet Commonly known as:  IMODIUM A-D Take 4 mg by mouth See admin instructions. Give 4 mg after 1st loose stool as needed for diarrhea   metoprolol tartrate 25 MG tablet Commonly known as:  LOPRESSOR Take 25 mg by mouth 2 (two) times daily. Do not give before dialysis Mon, Wed, and Fri   midodrine 10 MG tablet Commonly known as:  PROAMATINE Take 10 mg by mouth every Monday, Wednesday, and Friday. 30 minutes prior to dialysis treatment   multivitamin Tabs tablet Take 1 tablet by mouth at bedtime. What changed:  when to take this   pantoprazole 40 MG tablet Commonly known as:  PROTONIX Take 1 tablet (40 mg total) by mouth 2 (two) times daily.   warfarin 5 MG tablet Commonly known as:  COUMADIN Take 1 tablet (5 mg total) by mouth daily at 6 PM. Start taking on:   12/07/2018 What changed:  These instructions start on 12/07/2018. If you are unsure what to do until then, ask your doctor or other care provider.      Allergies  Allergen Reactions  . Tape Itching and Other (See Comments)    Cloth tape only      The results of significant diagnostics from this hospitalization (including imaging, microbiology, ancillary and laboratory) are listed below for reference.    Significant Diagnostic Studies: Dg Chest 2 View  Result Date: 11/09/2018 CLINICAL DATA:  Shortness of breath EXAM: CHEST - 2 VIEW COMPARISON:  11/07/2018 FINDINGS: Mild cardiomegaly. Improved aeration and lung volumes. Improving bilateral airspace opacities with mild residual interstitial opacities. No visible effusions. No acute bony abnormality. IMPRESSION: Improving aeration with increasing lung volumes and decreasing patchy bilateral airspace opacities. Continued interstitial opacities/thickening. Mild cardiomegaly. Electronically Signed   By: Rolm Baptise M.D.   On: 11/09/2018 08:50   Dg Chest 2 View  Result Date: 11/07/2018 CLINICAL DATA:  Respiratory distress EXAM: CHEST -  2 VIEW COMPARISON:  11/06/2018, 10/28/2017, CT chest 10/23/2018, 06/16/2012 FINDINGS: Bilateral interstitial and ground-glass opacity. No significant pleural effusion. Cardiomegaly with aortic atherosclerosis. No pneumothorax. IMPRESSION: Cardiomegaly. Moderate diffuse bilateral interstitial and ground-glass opacity which may be secondary to pulmonary edema or diffuse pneumonia. Electronically Signed   By: Donavan Foil M.D.   On: 11/07/2018 19:49   Ct Head Wo Contrast  Result Date: 12/02/2018 CLINICAL DATA:  77 year old male with fall. EXAM: CT HEAD WITHOUT CONTRAST TECHNIQUE: Contiguous axial images were obtained from the base of the skull through the vertex without intravenous contrast. COMPARISON:  Head CT dated 10/20/2018 FINDINGS: Evaluation of this exam is limited due to motion artifact. Brain: There is  moderate age-related atrophy and chronic microvascular ischemic changes. Left parietal old infarct noted. There is no acute intracranial hemorrhage. No mass effect or midline shift. No extra-axial fluid collection. Vascular: No hyperdense vessel or unexpected calcification. Skull: Normal. Negative for fracture or focal lesion. Sinuses/Orbits: The visualized paranasal sinuses are clear. Mild bilateral mastoid effusions noted. Other: None IMPRESSION: 1. No definite acute intracranial hemorrhage on this motion degraded exam. 2. Moderate age-related atrophy and chronic microvascular ischemic changes. Old left parietal infarct. Electronically Signed   By: Anner Crete M.D.   On: 12/02/2018 00:24   Mr Jodene Nam Head Wo Contrast  Result Date: 11/13/2018 CLINICAL DATA:  Stroke follow-up. EXAM: MRA HEAD WITHOUT CONTRAST TECHNIQUE: Angiographic images of the Circle of Willis were obtained using MRA technique without intravenous contrast. COMPARISON:  Head MRI 11/11/2018. No prior cerebral angiographic imaging. FINDINGS: The study is severely motion degraded despite repeated imaging attempts. The visualized distal vertebral arteries are patent to the basilar and codominant. The basilar artery is patent without evidence of flow limiting stenosis. A moderately large left posterior communicating artery is identified. The distal left P1 segment is poorly visualized which may reflect artifact, hypoplasia, or stenosis. The right P1 and P2 segments are patent without evidence of flow limiting stenosis. The intracranial internal carotid arteries are patent with extensive motion artifact limiting assessment of the cavernous and supraclinoid segments. Poor visualization of the right M1 segment is felt to be artifactual given a normal flow void on the recent MRI and presence of flow in proximal M2 vessels. The left M1 segment is patent with nondiagnostic assessment for stenosis due to artifact. The A1 and A2 segments are grossly  patent bilaterally. No large aneurysm is identified. IMPRESSION: Severely motion degraded study as above. No definite large vessel occlusion. Electronically Signed   By: Logan Bores M.D.   On: 11/13/2018 11:17   Mr Brain Wo Contrast  Result Date: 11/11/2018 CLINICAL DATA:  Initial evaluation for acute encephalopathy. EXAM: MRI HEAD WITHOUT CONTRAST TECHNIQUE: Multiplanar, multiecho pulse sequences of the brain and surrounding structures were obtained without intravenous contrast. COMPARISON:  Prior CT from 10/20/2018. FINDINGS: Brain: Examination moderately degraded by motion artifact. Diffuse prominence of the CSF containing spaces compatible with generalized cerebral atrophy. Patchy and confluent T2/FLAIR hyperintensity within the periventricular and deep white matter both cerebral hemispheres, consistent with chronic small vessel ischemic disease, moderate nature. Multiple superimposed remote lacunar infarcts within the bilateral basal ganglia and thalami. Associated chronic hemosiderin staining about several of these infarcts. Small remote cortical infarct at the left occipital lobe. 5 mm focus of diffusion abnormality at the posterior right corona radiata consistent with an acute ischemic small vessel type infarct (series 3, image 31). No associated hemorrhage or mass effect. No other evidence for acute or subacute ischemia. Gray-white matter differentiation  otherwise maintained. No acute intracranial hemorrhage. Several scattered chronic micro hemorrhages seen clustered about the deep gray nuclei and brainstem, most like related to chronic underlying hypertension. No mass lesion, midline shift or mass effect. Diffuse ventricular prominence related global parenchymal volume loss of hydrocephalus. No extra-axial fluid collection. Pituitary gland grossly within normal limits. Vascular: Major intracranial vascular flow voids maintained. Skull and upper cervical spine: Craniocervical junction within normal  limits. No focal marrow replacing lesion. Scalp soft tissues unremarkable. Sinuses/Orbits: Patient status post bilateral ocular lens replacement. Paranasal sinuses are largely clear. Bilateral mastoid effusions noted, right larger than left. Other: None. IMPRESSION: 1. 5 mm acute ischemic nonhemorrhagic small vessel type infarct involving the posterior right corona radiata. 2. Underlying moderate cerebral atrophy with chronic small vessel ischemic disease with multiple remote lacunar infarcts about the bilateral basal ganglia and thalami. 3. Additional remote left occipital cortical infarct. 4. Scattered chronic micro hemorrhages centered about the deep gray nuclei, likely due to chronic uncontrolled hypertension. Electronically Signed   By: Jeannine Boga M.D.   On: 11/11/2018 20:52   Dg Chest Portable 1 View  Result Date: 11/06/2018 CLINICAL DATA:  Syncope. EXAM: PORTABLE CHEST 1 VIEW COMPARISON:  October 28, 2018 FINDINGS: The heart size remains mildly enlarged. No pneumothorax. No nodules or masses. Patchy opacities in the lungs, particularly the right base. No other acute abnormalities. IMPRESSION: Patchy opacities in the lungs may represent multifocal pneumonia versus asymmetric edema. Recommend clinical correlation. Electronically Signed   By: Dorise Bullion III M.D   On: 11/06/2018 20:00   Vas [place] Abi With/wo Tbi  Result Date: 11/13/2018 LOWER EXTREMITY DOPPLER STUDY Indications: Peripheral artery disease.  Limitations: patient movement Performing Technologist: Abram Sander RVS  Examination Guidelines: A complete evaluation includes at minimum, Doppler waveform signals and systolic blood pressure reading at the level of bilateral brachial, anterior tibial, and posterior tibial arteries, when vessel segments are accessible. Bilateral testing is considered an integral part of a complete examination. Photoelectric Plethysmograph (PPG) waveforms and toe systolic pressure readings are included as  required and additional duplex testing as needed. Limited examinations for reoccurring indications may be performed as noted.  ABI Findings: +--------+------------------+-----+----------+--------+ Right   Rt Pressure (mmHg)IndexWaveform  Comment  +--------+------------------+-----+----------+--------+ [id number]                    triphasic          +--------+------------------+-----+----------+--------+ PTA     69                0.63 monophasic         +--------+------------------+-----+----------+--------+ DP      62                0.57 monophasic         +--------+------------------+-----+----------+--------+ +--------+------------------+-----+----------+-------------------------------+ Left    Lt Pressure (mmHg)IndexWaveform  Comment                         +--------+------------------+-----+----------+-------------------------------+ 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 +--------+------------------+-----+----------+-------------------------------+ PTA     63                0.58 monophasic                                +--------+------------------+-----+----------+-------------------------------+  DP                                       not audible                     +--------+------------------+-----+----------+-------------------------------+ +-------+-----------+-----------+------------+------------+ ABI/TBIToday's ABIToday's TBIPrevious ABIPrevious TBI +-------+-----------+-----------+------------+------------+ Right  0.63                                           +-------+-----------+-----------+------------+------------+ Left   0.58                                           +-------+-----------+-----------+------------+------------+  Summary: Right: Resting right ankle-brachial index indicates moderate right lower extremity arterial disease. Left: Resting left ankle-brachial index indicates moderate  left lower extremity arterial disease.  *See table(s) above for measurements and observations.  Electronically signed by Ruta Hinds MD on 11/13/2018 at 9:21:47 AM.   Final    Vas US Carotid  Result Date: 11/14/2018 Carotid Arterial Duplex Study Indications:       CVA, Weakness and Diplopia. Risk Factors:      Hypertension, hyperlipidemia, Diabetes, current smoker. Other Factors:     ESRD. Limitations:       Constant movement, positioning, confusion Comparison Study:  Prior study done 12/16/2010 is available for comparison. No                    significant change noted. Performing Technologist: Sharion Dove RVS  Examination Guidelines: A complete evaluation includes B-mode imaging, spectral Doppler, color Doppler, and power Doppler as needed of all accessible portions of each vessel. Bilateral testing is considered an integral part of a complete examination. Limited examinations for reoccurring indications may be performed as noted.  Right Carotid Findings: +----------+--------+--------+--------+------------+------------------+           PSV cm/sEDV cm/sStenosisDescribe    Comments           +----------+--------+--------+--------+------------+------------------+ CCA Prox  53      10                          intimal thickening +----------+--------+--------+--------+------------+------------------+ CCA Distal58      11                          intimal thickening +----------+--------+--------+--------+------------+------------------+ ICA Prox  45      12              heterogenous                   +----------+--------+--------+--------+------------+------------------+ ICA Distal42      15                                             +----------+--------+--------+--------+------------+------------------+ ECA       70      15                                             +----------+--------+--------+--------+------------+------------------+  +----------+--------+-------+--------+-------------------+  PSV cm/sEDV cmsDescribeArm Pressure (mmHG) +----------+--------+-------+--------+-------------------+ [id number]                                         +----------+--------+-------+--------+-------------------+ +---------+--------+--+--------+-+ VertebralPSV cm/s41EDV cm/s8 +---------+--------+--+--------+-+  Left Carotid Findings: +----------+--------+--------+--------+------------+------------------+           PSV cm/sEDV cm/sStenosisDescribe    Comments           +----------+--------+--------+--------+------------+------------------+ CCA Prox  84      8                           intimal thickening +----------+--------+--------+--------+------------+------------------+ CCA Distal60      7                           intimal thickening +----------+--------+--------+--------+------------+------------------+ ICA Prox  79      16              heterogenous                   +----------+--------+--------+--------+------------+------------------+ ICA Distal75      15                                             +----------+--------+--------+--------+------------+------------------+ +----------+--------+--------+--------+-------------------+ SubclavianPSV cm/sEDV cm/sDescribeArm Pressure (mmHG) +----------+--------+--------+--------+-------------------+           75                                          +----------+--------+--------+--------+-------------------+ +---------+--------+--+--------+-+ VertebralPSV cm/s43EDV cm/s8 +---------+--------+--+--------+-+  Summary: Right Carotid: Velocities in the right ICA are consistent with a 1-39% stenosis. Left Carotid: Velocities in the left ICA are consistent with a 1-39% stenosis. Vertebrals:  Bilateral vertebral arteries demonstrate antegrade flow. Subclavians: Normal flow hemodynamics were seen in bilateral subclavian               arteries. *See table(s) above for measurements and observations.  Electronically signed by Antony Contras MD on 11/14/2018 at 1:08:43 PM.    Final     Microbiology: Recent Results (from the past 240 hour(s))  MRSA PCR Screening     Status: None   Collection Time: 12/02/18 12:12 PM  Result Value Ref Range Status   MRSA by PCR NEGATIVE NEGATIVE Final    Comment:        The GeneXpert MRSA Assay (FDA approved for NASAL specimens only), is one component of a comprehensive MRSA colonization surveillance program. It is not intended to diagnose MRSA infection nor to guide or monitor treatment for MRSA infections. Performed at [facility], [place] [address], [place], [place] [address]      Labs: Basic Metabolic Panel: Recent Labs  Lab 12/01/18 2353 12/03/18 0746 12/04/18 0518  NA 140 143 141  K 3.5 3.6 3.6  CL 97* 100 99  CO2 22 23 24   GLUCOSE 128* 108* 134*  BUN 82* 96* 47*  CREATININE 6.56* 8.49* 5.53*  CALCIUM 8.7* 8.4* 8.6*  PHOS  --  8.3* 4.5   Liver Function Tests: Recent Labs  Lab 12/01/18 2353 12/03/18 0746 12/04/18 0518  AST 195*  --   --  ALT 231*  --   --   ALKPHOS 293*  --   --   BILITOT 1.1  --   --   PROT 6.6  --   --   ALBUMIN 2.9* 3.0* 2.6*   No results for input(s): LIPASE, AMYLASE in the last 168 hours. No results for input(s): AMMONIA in the last 168 hours. CBC: Recent Labs  Lab 12/01/18 2353 12/02/18 0855 12/03/18 0744 12/04/18 0518  WBC 14.6* 14.4* 11.9* 10.4  NEUTROABS 11.8*  --   --  7.8*  HGB 6.8* 9.7* 9.4* 9.5*  HCT 23.7* 31.9* 30.0* 30.6*  MCV 117.9* 107.4* 103.4* 106.3*  PLT 150 115* 102* 87*   Cardiac Enzymes: No results for input(s): CKTOTAL, CKMB, CKMBINDEX, TROPONINI in the last 168 hours. BNP: BNP (last 3 results) Recent Labs    11/06/18 1905  BNP 1,611.6*    ProBNP (last 3 results) No results for input(s): PROBNP in the last 8760 hours.  CBG: No results for input(s): GLUCAP in the last 168  hours.     Signed:  Nita Sells MD   [facility] 12/04/2018, 8:46 AM

## 2018-12-04 NOTE — Progress Notes (Signed)
[name] to be D/C'd Skilled nursing facility per MD order.  Discussed prescriptions and follow up appointments with the patient. Prescriptions given to patient, medication list explained in detail. Pt verbalized understanding.  Allergies as of 12/04/2018      Reactions   Tape Itching, Other (See Comments)   Cloth tape only      Medication List    TAKE these medications   acetaminophen 500 MG tablet Commonly known as:  TYLENOL Take 1,000 mg by mouth 3 (three) times daily.   atorvastatin 20 MG tablet Commonly known as:  LIPITOR Take 1 tablet (20 mg total) by mouth daily at 6 PM.   calcium acetate 667 MG capsule Commonly known as:  PHOSLO Take 4 capsules (2,668 mg total) by mouth 3 (three) times daily with meals.   doxercalciferol 4 MCG/2ML injection Commonly known as:  HECTOROL Inject 1 mL (2 mcg total) into the vein every Monday, Wednesday, and Friday with hemodialysis. Start taking on:  12/05/2018   feeding supplement (PRO-STAT SUGAR FREE 64) Liqd Take 30 mLs by mouth daily. What changed:  when to take this   ferrous gluconate 240 (27 FE) MG tablet Commonly known as:  FERGON Take 1 tablet (240 mg total) by mouth 3 (three) times daily with meals.   loperamide 2 MG tablet Commonly known as:  IMODIUM A-D Take 4 mg by mouth See admin instructions. Give 4 mg after 1st loose stool as needed for diarrhea   metoprolol tartrate 25 MG tablet Commonly known as:  LOPRESSOR Take 25 mg by mouth 2 (two) times daily. Do not give before dialysis Mon, Wed, and Fri   midodrine 10 MG tablet Commonly known as:  PROAMATINE Take 10 mg by mouth every Monday, Wednesday, and Friday. 30 minutes prior to dialysis treatment   multivitamin Tabs tablet Take 1 tablet by mouth at bedtime. What changed:  when to take this   pantoprazole 40 MG tablet Commonly known as:  PROTONIX Take 1 tablet (40 mg total) by mouth 2 (two) times daily.   warfarin 5 MG tablet Commonly known as:   COUMADIN Take 1 tablet (5 mg total) by mouth daily at 6 PM. Start taking on:  12/07/2018 What changed:  These instructions start on 12/07/2018. If you are unsure what to do until then, ask your doctor or other care provider.       Vitals:   12/04/18 0622 12/04/18 0758  BP: (!) 119/52 110/65  Pulse: 94 97  Resp: 20 18  Temp: 98.4 F (36.9 C) 98.3 F (36.8 C)  SpO2: 100% 100%    IV catheter discontinued intact. Site without signs and symptoms of complications. Dressing and pressure applied. Pt denies pain at this time. No complaints noted.  An After Visit Summary was printed and given to the patient. Patient escorted via stretcher, and D/C to [facility] via [place].  Aneta Mins BSN, RN3

## 2018-12-04 NOTE — Progress Notes (Signed)
Report called and given to Ander Purpura, RN at [facility].

## 2018-12-05 ENCOUNTER — Non-Acute Institutional Stay (SKILLED_NURSING_FACILITY): Payer: Medicare Other | Admitting: Internal Medicine

## 2018-12-05 ENCOUNTER — Other Ambulatory Visit: Payer: Self-pay

## 2018-12-05 ENCOUNTER — Encounter: Payer: Self-pay | Admitting: Internal Medicine

## 2018-12-05 ENCOUNTER — Other Ambulatory Visit: Payer: Self-pay | Admitting: *Deleted

## 2018-12-05 ENCOUNTER — Encounter (HOSPITAL_COMMUNITY): Payer: Self-pay | Admitting: *Deleted

## 2018-12-05 ENCOUNTER — Telehealth: Payer: Self-pay | Admitting: *Deleted

## 2018-12-05 DIAGNOSIS — E1351 Other specified diabetes mellitus with diabetic peripheral angiopathy without gangrene: Secondary | ICD-10-CM

## 2018-12-05 DIAGNOSIS — D62 Acute posthemorrhagic anemia: Secondary | ICD-10-CM

## 2018-12-05 DIAGNOSIS — D649 Anemia, unspecified: Secondary | ICD-10-CM | POA: Diagnosis not present

## 2018-12-05 DIAGNOSIS — K922 Gastrointestinal hemorrhage, unspecified: Secondary | ICD-10-CM | POA: Diagnosis not present

## 2018-12-05 DIAGNOSIS — K219 Gastro-esophageal reflux disease without esophagitis: Secondary | ICD-10-CM

## 2018-12-05 DIAGNOSIS — I96 Gangrene, not elsewhere classified: Secondary | ICD-10-CM

## 2018-12-05 DIAGNOSIS — E7849 Other hyperlipidemia: Secondary | ICD-10-CM

## 2018-12-05 DIAGNOSIS — I953 Hypotension of hemodialysis: Secondary | ICD-10-CM

## 2018-12-05 DIAGNOSIS — I480 Paroxysmal atrial fibrillation: Secondary | ICD-10-CM

## 2018-12-05 NOTE — Progress Notes (Signed)
Pt is a resident at [facility]. Spoke with Scot Dock, nurse for pt. Pt just discharged from here yesterday for falls, found to be anemic and given blood transfusion. Monette states pt is better, no more falls since arriving back to SNF. She states pt is alert, oriented and able to speak for himself. Pt has hx of A-fib, pt on Coumadin. Last dose of Coumadin was 11/28/18 per Monette. Dr. Ellyn Hack is pt's cardiologist. Pt is a type 2 diabetic, last A1C was 4.2 on 11/11/18. Pt will be arriving via SNF transportation in a wheelchair. Pre-op instructions faxed to [facility] at [phone].

## 2018-12-05 NOTE — Anesthesia Preprocedure Evaluation (Addendum)
Anesthesia Evaluation  Patient identified by MRN, date of birth, ID band Patient awake    Reviewed: Allergy & Precautions, NPO status , Patient's Chart, lab work & pertinent test results, reviewed documented beta blocker date and time   History of Anesthesia Complications Negative for: history of anesthetic complications  Airway Mallampati: II  TM Distance: >3 FB Neck ROM: Full    Dental  (+) Dental Advisory Given, Poor Dentition, Edentulous Upper   Pulmonary sleep apnea and Continuous Positive Airway Pressure Ventilation , former smoker,    Pulmonary exam normal        Cardiovascular hypertension, Pt. on home beta blockers + Peripheral Vascular Disease  Normal cardiovascular exam+ dysrhythmias Atrial Fibrillation   Study Conclusions  - Left ventricle: The cavity size was mildly reduced. There was   mild concentric hypertrophy. Systolic function was normal. The   estimated ejection fraction was in the range of 60% to 65%. Wall   motion was normal; there were no regional wall motion   abnormalities. - Aortic valve: Cusp separation was severely reduced. Immobile left   coronary cusp. Right coronary and noncoronary cusp mobility was   moderately restricted. Transvalvular velocity was increased less   than expected. There was moderate stenosis. By planimetry the   valve area is 0.9 cm sq. Valve area (VTI): 0.78 cm^2. Valve area   (Vmean): 0.92 cm^2. - Mitral valve: Calcified annulus. Mildly thickened leaflets . - Left atrium: The atrium was moderately dilated. - Right ventricle: The cavity size was severely dilated. Systolic   function was moderately to severely reduced. - Right atrium: The atrium was moderately dilated. - Tricuspid valve: There was mild-moderate regurgitation directed   centrally. - Pulmonary arteries: PA peak pressure: 67 mm Hg (S).   Neuro/Psych CVA negative psych ROS   GI/Hepatic negative GI ROS, Neg  liver ROS,   Endo/Other  negative endocrine ROSdiabetes  Renal/GU ESRF and DialysisRenal disease  negative genitourinary   Musculoskeletal negative musculoskeletal ROS (+)   Abdominal   Peds negative pediatric ROS (+)  Hematology negative hematology ROS (+)   Anesthesia Other Findings   Reproductive/Obstetrics negative OB ROS                          Anesthesia Physical Anesthesia Plan  ASA: III  Anesthesia Plan: MAC and Regional   Post-op Pain Management:    Induction:   PONV Risk Score and Plan: 1 and Ondansetron and Propofol infusion  Airway Management Planned: Natural Airway and Simple Face Mask  Additional Equipment:   Intra-op Plan:   Post-operative Plan:   Informed Consent: I have reviewed the patients History and Physical, chart, labs and discussed the procedure including the risks, benefits and alternatives for the proposed anesthesia with the patient or authorized representative who has indicated his/her understanding and acceptance.   Dental advisory given  Plan Discussed with: CRNA and Anesthesiologist  Anesthesia Plan Comments: ( 11/10-11/21/19 with acute cva complicated by acute hypoxic resp failure from vol overload. 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

## 2018-12-05 NOTE — Telephone Encounter (Signed)
Surgery time change called to [place] at [facility]. To arrive at [facility] admitting department at 11:30 am on 12/06/2018.

## 2018-12-05 NOTE — Pre-Procedure Instructions (Addendum)
   [name]  12/05/2018    [name]' procedure is scheduled on Tuesday, 12/06/18 at 2:10 PM.   Report to [facility] Entrance "A" Admitting Office at 11:45 AM.   Call this number if you have problems the morning of surgery: [phone]   Remember:  Patient is not to eat or drink after midnight tonight, 12/05/18.  Take these medicines the morning of surgery with A SIP OF WATER: Acetaminophen (Tylenol), Pantoprazole (Protonix), Metoprolol (Lopressor)  Hold Multivitamin tonight only. Continue to hold Coumadin as instructed.  Please check [name]' blood sugar in the AM when he gets up and every 2 hours until he leaves for the hospital. If blood sugar is 70 or below, treat with 1/2 cup of clear juice (apple or cranberry) and recheck blood sugar 15 minutes after drinking juice. If blood sugar continues to be 70 or below, call the Short Stay department and ask to speak to a nurse.    Do not wear jewelry.  Do not wear lotions, powders, cologne or deodorant.  Men may shave face and neck.  Do not bring valuables to the hospital.  [facility] is not responsible for any belongings or valuables.  Contacts, dentures or bridgework may not be worn into surgery.  Leave your suitcase in the car.  After surgery it may be brought to your room.  For patients admitted to the hospital, discharge time will be determined by your treatment team.  Patients discharged the day of surgery will not be allowed to drive home.   Please call me, Lilia Pro, RN today with any questions at [phone].

## 2018-12-05 NOTE — Progress Notes (Signed)
:  Location:  [place] Room Number: 100P Place of Service:  SNF (31)   D. [name], MD  Patient Care Team: Hennie Duos, MD as PCP - General (Internal Medicine) [name], MD as Consulting Physician (Nephrology) Rehab, [place] ([place], [facility]  Extended Emergency Contact Information Primary Emergency Contact: [name],[name] Address: [address] [place], [place] [address] [name] of [place] Phone: [phone] Work Phone: [phone] Mobile Phone: [phone] Relation: Daughter     Allergies: Tape  Chief Complaint  Patient presents with  . New Admit To SNF    Admit to [facility]    HPI: Patient is 77 y.o. male with atrial fibrillation on Coumadin, end-stage renal disease on dialysis Monday Wednesday Friday, history of GI bleed who presented to the emergency department are being found down in a skilled nursing facility next to her bed.  He was confused and he is not normally.  Blood pressure was noted to be in the systolic of 01B and he was hypoxic.  He was placed on 3 L of O2 and brought to the emergency department.  Patient was noted to be anticoagulated on warfarin.  Several weeks ago he was admitted with a hemoglobin of 6.7 and received 2 units PRBC.  Coumadin was continued as a recommendation by neurology due to the severity of his cerebrovascular disease and multiple strokes.  Hemoccult was positive in the emergency department.  Patient was felt to have anemia due to slow GI bleed and patient was transfused with 2 units PRBC again.  Patient was also noted to have dry gangrene left fifth toe which she had angioplasty done on 11/19, with stent placement.  Plans were made for patient to have amputation of left fifth toe in several days scheduled on Tuesday.  Noted back to skilled nursing facility for OT/PT.  While at skilled nursing facility patient will be followed  for lipidemia treated with Lipitor, history of recent CVA treated with warfarin and history of GERD treated the Protonix.  Past Medical History:  Diagnosis Date  . A-fib ([place])   . Anemia   . Blood transfusion   . BPH (benign prostatic hyperplasia)   . CHF (congestive heart failure) ([place])   . Diarrhea   . DM (diabetes mellitus) ([place])   . ESRD on hemodialysis ([place])    Started dialysis in 2009  . History of GI bleed    secondary to coumadin  . HTN (hypertension)   . Hyperlipidemia   . OSA (obstructive sleep apnea)    uses CPAP  . Secondary hyperparathyroidism of renal origin [facility])     Past Surgical History:  Procedure Laterality Date  . ABDOMINAL AORTOGRAM N/A 11/15/2018   Procedure: ABDOMINAL AORTOGRAM;  Surgeon: [name], MD;  Location: [place] CV LAB;  Service: Cardiovascular;  Laterality: N/A;  . BVT  05/06/24   Left  Basilic Vein Transposition  . CHOLECYSTECTOMY    . EYE SURGERY     Catarct bil  . INSERTION OF DIALYSIS CATHETER  05/28/2012   Procedure: INSERTION OF DIALYSIS CATHETER;  Surgeon: Mal Misty, MD;  Location: [place];  Service: Vascular;  Laterality: Right;  . Left arm shuntogram.    . Left forearm loop graft with 6 mm Gore-Tex graft.    . LOWER EXTREMITY ANGIOGRAPHY Bilateral 11/15/2018   Procedure: LOWER EXTREMITY ANGIOGRAPHY;  Surgeon: [name], MD;  Location: [place] CV LAB;  Service: Cardiovascular;  Laterality: Bilateral;  . Pars plana vitrectomy with 25-gauge system    . PERIPHERAL VASCULAR ATHERECTOMY Left 11/15/2018   Procedure: PERIPHERAL VASCULAR ATHERECTOMY;  Surgeon: [name], MD;  Location: [place] CV LAB;  Service: Cardiovascular;  Laterality: Left;  SFA with STENT  . PERIPHERAL VASCULAR BALLOON ANGIOPLASTY Left 11/15/2018   Procedure: PERIPHERAL VASCULAR BALLOON ANGIOPLASTY;  Surgeon: [name], MD;  Location: [place] CV LAB;  Service: Cardiovascular;  Laterality: Left;  PT    Allergies as of  12/05/2018      Reactions   Tape Itching, Other (See Comments)   Cloth tape only      Medication List        Accurate as of 12/05/18  3:21 PM. Always use your most recent med list.          acetaminophen 500 MG tablet Commonly known as:  TYLENOL Take 1,000 mg by mouth 3 (three) times daily.   atorvastatin 20 MG tablet Commonly known as:  LIPITOR Take 1 tablet (20 mg total) by mouth daily at 6 PM.   calcium acetate 667 MG capsule Commonly known as:  PHOSLO Take 4 capsules (2,668 mg total) by mouth 3 (three) times daily with meals.   doxercalciferol 4 MCG/2ML injection Commonly known as:  HECTOROL Inject 1 mL (2 mcg total) into the vein every Monday, Wednesday, and Friday with hemodialysis.   feeding supplement (PRO-STAT SUGAR FREE 64) Liqd Take 30 mLs by mouth daily.   ferrous gluconate 240 (27 FE) MG tablet Commonly known as:  FERGON Take 1 tablet (240 mg total) by mouth 3 (three) times daily with meals.   loperamide 2 MG tablet Commonly known as:  IMODIUM A-D Take 4 mg by mouth See admin instructions. Give 4 mg after 1st loose stool as needed for diarrhea   metoprolol tartrate 25 MG tablet Commonly known as:  LOPRESSOR Take 25 mg by mouth 2 (two) times daily. Do not give before dialysis Mon, Wed, and Fri   midodrine 10 MG tablet Commonly known as:  PROAMATINE Take 10 mg by mouth every Monday, Wednesday, and Friday. 30 minutes prior to dialysis treatment   multivitamin Tabs tablet Take 1 tablet by mouth at bedtime.   pantoprazole 40 MG tablet Commonly known as:  PROTONIX Take 1 tablet (40 mg total) by mouth 2 (two) times daily.   warfarin 5 MG tablet Commonly known as:  COUMADIN Take 1 tablet (5 mg total) by mouth daily at 6 PM. Start taking on:  12/07/2018       No orders of the defined types were placed in this encounter.    There is no immunization history on file for this patient.  Social History   Tobacco Use  . Smoking status: Former Smoker     Types: Cigarettes    Last attempt to quit: 06/30/2004    Years since quitting: 14.4  . Smokeless tobacco: Never Used  Substance Use Topics  . Alcohol use: No    Family history is   Family History  Problem Relation Age of Onset  . Alzheimer's disease Mother   . Diabetes Father        Amputation:  bilateral legs  . Cancer Daughter        breast cancer  . Diabetes Son   . Heart disease Son        before age 20  . Hypertension Son   . Anesthesia problems  Neg Hx       Review of Systems  DATA OBTAINED: from patient, nurse GENERAL:  no fevers, fatigue, appetite changes SKIN: No itching, or rash EYES: No eye pain, redness, discharge EARS: No earache, tinnitus, change in hearing NOSE: No congestion, drainage or bleeding  MOUTH/THROAT: No mouth or tooth pain, No sore throat RESPIRATORY: No cough, wheezing, SOB CARDIAC: No chest pain, palpitations, lower extremity edema  GI: No abdominal pain, No N/V/D or constipation, No heartburn or reflux  GU: No dysuria, frequency or urgency, or incontinence  MUSCULOSKELETAL: No unrelieved bone/joint pain NEUROLOGIC: No headache, dizziness or focal weakness PSYCHIATRIC: No c/o anxiety or sadness   Vitals:   12/05/18 1520  BP: (!) 90/58  Pulse: 79  Resp: 18  Temp: 98.2 F (36.8 C)    SpO2 Readings from Last 1 Encounters:  12/04/18 100%   Body mass index is 24.63 kg/m.     Physical Exam  GENERAL APPEARANCE: Alert, conversant,  No acute distress.  SKIN: No diaphoresis rash HEAD: Normocephalic, atraumatic  EYES: Conjunctiva/lids clear. Pupils round, reactive. EOMs intact.  EARS: External exam WNL, canals clear. Hearing grossly normal.  NOSE: No deformity or discharge.  MOUTH/THROAT: Lips w/o lesions  RESPIRATORY: Breathing is even, unlabored. Lung sounds are clear   CARDIOVASCULAR: Heart RRR no murmurs, rubs or gallops. No peripheral edema.   GASTROINTESTINAL: Abdomen is soft, non-tender, not distended w/ normal bowel  sounds. GENITOURINARY: Bladder non tender, not distended  MUSCULOSKELETAL: No abnormal joints or musculature left small toe dressed NEUROLOGIC:  Cranial nerves 2-12 grossly intact. Moves all extremities  PSYCHIATRIC: Mood and affect appropriate to situation, no behavioral issues  Patient Active Problem List   Diagnosis Date Noted  . Pressure injury of skin 12/03/2018  . Symptomatic anemia 12/02/2018  . Acute on chronic blood loss anemia 12/02/2018  . 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 Lower GI bleeding 12/02/2018  . Heme positive stool   . Acute on chronic diastolic (congestive) heart failure ([place]) 11/19/2018  . Acute CVA (cerebrovascular accident) ([place]) 11/19/2018  . Hypoglycemia 11/19/2018  . Peripheral vascular disease due to secondary diabetes ([place]) 11/19/2018  . Dry gangrene ([place]) 11/19/2018  . 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 DNR (do not resuscitate) discussion   . Goals of care, counseling/discussion   . Palliative care by specialist   . Acute hypoxemic respiratory failure ([place]) 11/06/2018  . Volume overload 11/06/2018  . Multifocal pneumonia 11/06/2018  . Acute metabolic encephalopathy 34/19/6222  . Physical deconditioning 11/06/2018  . Acute respiratory failure with hypoxia and hypercapnia ([place]) 11/04/2018  . CAP (community acquired pneumonia) 11/04/2018  . Pleural effusion, right 11/04/2018  . Increased ammonia level 11/04/2018  . Abnormal CT of the abdomen 11/04/2018  . Atrial fibrillation with RVR ([place]) 11/04/2018  . Sepsis ([place]) 10/21/2018  . Acute encephalopathy 10/21/2018  . Elevated alkaline phosphatase level 10/21/2018  . Chronic anemia 10/21/2018  . Thrombocytopenia ([place]) 10/21/2018  . Pulmonary HTN ([place]) 03/04/2017  . Aortic valve stenosis 03/04/2017  . Hypertension, accelerated with heart disease, without CHF 01/12/2017  . Dyspnea on exertion 01/12/2017  . ESRD (end stage renal disease) ([place]) 06/07/2012  .  Hyperlipidemia 04/24/2011  . HYPERTENSION, BENIGN 10/24/2009  . DM (diabetes mellitus) ([place]) 10/23/2009  . OBSTRUCTIVE SLEEP APNEA 10/23/2009  . PAF (paroxysmal atrial fibrillation) ([place]) 10/23/2009  . CHF (congestive heart failure) ([place]) 10/23/2009      Labs reviewed: Basic Metabolic Panel:    Component Value Date/Time  NA 141 12/04/2018 0518   K 3.6 12/04/2018 0518   CL 99 12/04/2018 0518   CO2 24 12/04/2018 0518   GLUCOSE 134 (H) 12/04/2018 0518   BUN 47 (H) 12/04/2018 0518   CREATININE 5.53 (H) 12/04/2018 0518   CALCIUM 8.6 (L) 12/04/2018 0518   CALCIUM 8.3 (L) 10/25/2008 1450   PROT 6.6 12/01/2018 2353   ALBUMIN 2.6 (L) 12/04/2018 0518   AST 195 (H) 12/01/2018 2353   ALT 231 (H) 12/01/2018 2353   ALKPHOS 293 (H) 12/01/2018 2353   BILITOT 1.1 12/01/2018 2353   GFRNONAA 9 (L) 12/04/2018 0518   GFRAA 11 (L) 12/04/2018 0518    Recent Labs    11/08/18 0016 11/09/18 0230  11/14/18 0241  12/01/18 2353 12/03/18 0746 12/04/18 0518  NA 138 137   < > 135   < > 140 143 141  K 3.8 4.1   < > 5.0   < > 3.5 3.6 3.6  CL 100 100   < > 97*   < > 97* 100 99  CO2 26 27   < > 25   < > 22 23 24   GLUCOSE 105* 94   < > 139*   < > 128* 108* 134*  BUN 15 39*   < > 72*   < > 82* 96* 47*  CREATININE 3.19* 5.34*   < > 7.61*   < > 6.56* 8.49* 5.53*  CALCIUM 8.8* 9.1   < > 9.8   < > 8.7* 8.4* 8.6*  MG 2.0 2.2  --  2.4  --   --   --   --   PHOS  --   --    < > 2.4*  --   --  8.3* 4.5   < > = values in this interval not displayed.   Liver Function Tests: Recent Labs    11/07/18 2046 11/09/18 0230  12/01/18 2353 12/03/18 0746 12/04/18 0518  AST 26 20  --  195*  --   --   ALT 27 26  --  231*  --   --   ALKPHOS 198* 197*  --  293*  --   --   BILITOT 1.1 0.8  --  1.1  --   --   PROT 7.0 6.9  --  6.6  --   --   ALBUMIN 3.0* 2.8*   < > 2.9* 3.0* 2.6*   < > = values in this interval not displayed.   Recent Labs    10/21/18 0520 10/24/18 0614  LIPASE 35 32   Recent Labs     10/26/18 0538 11/06/18 1850 11/09/18 0230  AMMONIA 42* 40* 42*   CBC: Recent Labs    11/17/18 0242 12/01/18 2353 12/02/18 0855 12/03/18 0744 12/04/18 0518  WBC 10.7* 14.6* 14.4* 11.9* 10.4  NEUTROABS 7.4 11.8*  --   --  7.8*  HGB 8.7* 6.8* 9.7* 9.4* 9.5*  HCT 27.0* 23.7* 31.9* 30.0* 30.6*  MCV 96.4 117.9* 107.4* 103.4* 106.3*  PLT 133* 150 115* 102* 87*   Lipid Recent Labs    11/11/18 1517  CHOL 113  HDL 39*  LDLCALC 62  TRIG 60    Cardiac Enzymes: Recent Labs    11/06/18 1855  TROPONINI <0.03   BNP: Recent Labs    11/06/18 1905  BNP 1,611.6*   No results found for: [facility] Lab Results  Component Value Date   HGBA1C 4.2 (L) 11/11/2018   Lab Results  Component Value  Date   TSH 1.956 11/09/2018   Lab Results  Component Value Date   KJZPHXTA56 979 11/11/2018   Lab Results  Component Value Date   FOLATE  10/17/2008    >20.0 (NOTE)  Reference Ranges        Deficient:       0.4 - 3.3 ng/mL        Indeterminate:   3.4 - 5.4 ng/mL        Normal:              > 5.4 ng/mL   Lab Results  Component Value Date   IRON 48 06/05/2011   TIBC 194 (L) 06/05/2011   FERRITIN 872 (H) 06/05/2011    Imaging and Procedures obtained prior to SNF admission: Ct Head Wo Contrast  Result Date: 12/02/2018 CLINICAL DATA:  77 year old male with fall. EXAM: CT HEAD WITHOUT CONTRAST TECHNIQUE: Contiguous axial images were obtained from the base of the skull through the vertex without intravenous contrast. COMPARISON:  Head CT dated 10/20/2018 FINDINGS: Evaluation of this exam is limited due to motion artifact. Brain: There is moderate age-related atrophy and chronic microvascular ischemic changes. Left parietal old infarct noted. There is no acute intracranial hemorrhage. No mass effect or midline shift. No extra-axial fluid collection. Vascular: No hyperdense vessel or unexpected calcification. Skull: Normal. Negative for fracture or focal lesion. Sinuses/Orbits: The  visualized paranasal sinuses are clear. Mild bilateral mastoid effusions noted. Other: None IMPRESSION: 1. No definite acute intracranial hemorrhage on this motion degraded exam. 2. Moderate age-related atrophy and chronic microvascular ischemic changes. Old left parietal infarct. Electronically Signed   By: Anner Crete M.D.   On: 12/02/2018 00:24     Not all labs, radiology exams or other studies done during hospitalization come through on my EPIC note; however they are reviewed by me.    Assessment and Plan  Symptomatic anemia/acute on chronic blood loss anemia/lower GI bleeding- last colonoscopy was 2012 that showed rare diverticuli; GI bleed because Coumadin to be held in November but the treatment was started when patient had stroke; GI has not seen patient and did not think it Necessary stool was not black at this stage and his hemoglobin was stable after transfusion; patient was transfused with 2 units hemoglobin went from 6.8-9.4 and patient was placed on iron 3 times daily; felt to be likely a slow GI bleed with no needs at this time for repeat endoscopy SNF-admitted for OT/PT; follow-up CBC  Peripheral vascular disease secondary to diabetes- left fifth toe with gangrene; patient scheduled to follow-up in several days for amputation  Paroxysmal atrial fibrillation- patient has been anticoagulated on warfarin and this is a risk versus benefit, stroke versus bleed SNF-continue metoprolol 25 mg twice daily for rate and warfarin as prophylaxis  Hyperlipidemia SNF- not stated as uncontrolled; continue Lipitor 20 mg daily  Hypotension SNF-continue Middaugh drain every Monday Wednesday Friday 30 minutes prior to dialysis treatment  GERD SNF- stable; continue Protonix 40 mg twice daily       Time spent greater than 45 minutes;> 50% of time with patient was spent reviewing records, labs, tests and studies, counseling and developing plan of care  Webb Silversmith D. [name], MD

## 2018-12-06 ENCOUNTER — Encounter (HOSPITAL_COMMUNITY)
Admission: RE | Disposition: A | Discharge status: Skilled Nursing Facility | Payer: Self-pay | Source: Ambulatory Visit | Attending: Vascular Surgery

## 2018-12-06 ENCOUNTER — Ambulatory Visit (HOSPITAL_COMMUNITY)
Admission: RE | Admit: 2018-12-06 | Discharge: 2018-12-06 | Disposition: A | Discharge status: Skilled Nursing Facility | Payer: Medicare Other | Source: Ambulatory Visit | Attending: Vascular Surgery | Admitting: Vascular Surgery

## 2018-12-06 ENCOUNTER — Ambulatory Visit (HOSPITAL_COMMUNITY): Payer: Medicare Other | Admitting: Physician Assistant

## 2018-12-06 ENCOUNTER — Other Ambulatory Visit: Payer: Self-pay

## 2018-12-06 ENCOUNTER — Encounter (HOSPITAL_COMMUNITY): Payer: Self-pay | Admitting: *Deleted

## 2018-12-06 ENCOUNTER — Ambulatory Visit (HOSPITAL_COMMUNITY): Admission: RE | Admit: 2018-12-06 | Payer: Medicare Other | Source: Home / Self Care | Admitting: Vascular Surgery

## 2018-12-06 DIAGNOSIS — Z888 Allergy status to other drugs, medicaments and biological substances status: Secondary | ICD-10-CM | POA: Diagnosis not present

## 2018-12-06 DIAGNOSIS — Z87891 Personal history of nicotine dependence: Secondary | ICD-10-CM | POA: Insufficient documentation

## 2018-12-06 DIAGNOSIS — Z7901 Long term (current) use of anticoagulants: Secondary | ICD-10-CM | POA: Diagnosis not present

## 2018-12-06 DIAGNOSIS — E1152 Type 2 diabetes mellitus with diabetic peripheral angiopathy with gangrene: Secondary | ICD-10-CM | POA: Diagnosis not present

## 2018-12-06 DIAGNOSIS — I132 Hypertensive heart and chronic kidney disease with heart failure and with stage 5 chronic kidney disease, or end stage renal disease: Secondary | ICD-10-CM | POA: Diagnosis not present

## 2018-12-06 DIAGNOSIS — I509 Heart failure, unspecified: Secondary | ICD-10-CM | POA: Insufficient documentation

## 2018-12-06 DIAGNOSIS — N400 Enlarged prostate without lower urinary tract symptoms: Secondary | ICD-10-CM | POA: Diagnosis not present

## 2018-12-06 DIAGNOSIS — L97529 Non-pressure chronic ulcer of other part of left foot with unspecified severity: Secondary | ICD-10-CM | POA: Diagnosis not present

## 2018-12-06 DIAGNOSIS — E11621 Type 2 diabetes mellitus with foot ulcer: Secondary | ICD-10-CM | POA: Insufficient documentation

## 2018-12-06 DIAGNOSIS — Z992 Dependence on renal dialysis: Secondary | ICD-10-CM | POA: Insufficient documentation

## 2018-12-06 DIAGNOSIS — E1122 Type 2 diabetes mellitus with diabetic chronic kidney disease: Secondary | ICD-10-CM | POA: Insufficient documentation

## 2018-12-06 DIAGNOSIS — Z7982 Long term (current) use of aspirin: Secondary | ICD-10-CM | POA: Diagnosis not present

## 2018-12-06 DIAGNOSIS — Z79899 Other long term (current) drug therapy: Secondary | ICD-10-CM | POA: Diagnosis not present

## 2018-12-06 DIAGNOSIS — G4733 Obstructive sleep apnea (adult) (pediatric): Secondary | ICD-10-CM | POA: Diagnosis not present

## 2018-12-06 DIAGNOSIS — I96 Gangrene, not elsewhere classified: Secondary | ICD-10-CM | POA: Insufficient documentation

## 2018-12-06 DIAGNOSIS — I4891 Unspecified atrial fibrillation: Secondary | ICD-10-CM | POA: Insufficient documentation

## 2018-12-06 DIAGNOSIS — N2581 Secondary hyperparathyroidism of renal origin: Secondary | ICD-10-CM | POA: Diagnosis not present

## 2018-12-06 DIAGNOSIS — E785 Hyperlipidemia, unspecified: Secondary | ICD-10-CM | POA: Diagnosis not present

## 2018-12-06 DIAGNOSIS — N186 End stage renal disease: Secondary | ICD-10-CM | POA: Diagnosis not present

## 2018-12-06 HISTORY — PX: AMPUTATION: SHX166

## 2018-12-06 LAB — CBC
HCT: 34.7 % — ABNORMAL LOW (ref 39.0–52.0)
Hemoglobin: 10.1 g/dL — ABNORMAL LOW (ref 13.0–17.0)
MCH: 32.3 pg (ref 26.0–34.0)
MCHC: 29.1 g/dL — ABNORMAL LOW (ref 30.0–36.0)
MCV: 110.9 fL — ABNORMAL HIGH (ref 80.0–100.0)
Platelets: 85 10*3/uL — ABNORMAL LOW (ref 150–400)
RBC: 3.13 MIL/uL — ABNORMAL LOW (ref 4.22–5.81)
RDW: 24.2 % — ABNORMAL HIGH (ref 11.5–15.5)
WBC: 9.7 10*3/uL (ref 4.0–10.5)
nRBC: 0 % (ref 0.0–0.2)

## 2018-12-06 LAB — GLUCOSE, CAPILLARY
Glucose-Capillary: 84 mg/dL (ref 70–99)
Glucose-Capillary: 110 mg/dL — ABNORMAL HIGH (ref 70–99)
Glucose-Capillary: 100 mg/dL — ABNORMAL HIGH (ref 70–99)

## 2018-12-06 LAB — PROTIME-INR
INR: 1.4
Prothrombin Time: 17 seconds — ABNORMAL HIGH (ref 11.4–15.2)

## 2018-12-06 LAB — APTT: aPTT: 41 seconds — ABNORMAL HIGH (ref 24–36)

## 2018-12-06 SURGERY — AMPUTATION DIGIT
Anesthesia: Monitor Anesthesia Care | Site: Foot | Laterality: Left

## 2018-12-06 MED ORDER — SODIUM CHLORIDE 0.9 % IV SOLN
INTRAVENOUS | Status: DC
  Administered 2018-12-06: 13:00:00 via INTRAVENOUS

## 2018-12-06 MED ORDER — PROPOFOL 500 MG/50ML IV EMUL
INTRAVENOUS | Status: DC | PRN
  Administered 2018-12-06: 50 ug/kg/min via INTRAVENOUS

## 2018-12-06 MED ORDER — ROPIVACAINE HCL 5 MG/ML IJ SOLN
INTRAMUSCULAR | Status: DC | PRN
  Administered 2018-12-06: 30 mL via PERINEURAL

## 2018-12-06 MED ORDER — PROMETHAZINE HCL 25 MG/ML IJ SOLN: 6.2500 mg | INTRAMUSCULAR | Status: DC | PRN

## 2018-12-06 MED ORDER — FENTANYL CITRATE (PF) 100 MCG/2ML IJ SOLN
50.0000 ug | Freq: Once | INTRAMUSCULAR | Status: AC
  Administered 2018-12-06: 50 ug via INTRAVENOUS

## 2018-12-06 MED ORDER — LIDOCAINE 2% (20 MG/ML) 5 ML SYRINGE
INTRAMUSCULAR | Status: AC
  Filled 2018-12-06: qty 5

## 2018-12-06 MED ORDER — ONDANSETRON HCL 4 MG/2ML IJ SOLN
INTRAMUSCULAR | Status: DC | PRN
  Administered 2018-12-06: 4 mg via INTRAVENOUS

## 2018-12-06 MED ORDER — MIDAZOLAM HCL 2 MG/2ML IJ SOLN
INTRAMUSCULAR | Status: AC
  Filled 2018-12-06: qty 2

## 2018-12-06 MED ORDER — LIDOCAINE 2% (20 MG/ML) 5 ML SYRINGE
INTRAMUSCULAR | Status: DC | PRN
  Administered 2018-12-06: 20 mg via INTRAVENOUS

## 2018-12-06 MED ORDER — BACITRACIN ZINC 500 UNIT/GM EX OINT
TOPICAL_OINTMENT | CUTANEOUS | Status: DC | PRN
  Administered 2018-12-06: 1 via TOPICAL

## 2018-12-06 MED ORDER — ONDANSETRON HCL 4 MG/2ML IJ SOLN
INTRAMUSCULAR | Status: AC
  Filled 2018-12-06: qty 2

## 2018-12-06 MED ORDER — 0.9 % SODIUM CHLORIDE (POUR BTL) OPTIME
TOPICAL | Status: DC | PRN
  Administered 2018-12-06: 1000 mL

## 2018-12-06 MED ORDER — CEFAZOLIN SODIUM-DEXTROSE 2-4 GM/100ML-% IV SOLN
2.0000 g | INTRAVENOUS | Status: AC
  Administered 2018-12-06: 2 g via INTRAVENOUS

## 2018-12-06 MED ORDER — PHENYLEPHRINE 40 MCG/ML (10ML) SYRINGE FOR IV PUSH (FOR BLOOD PRESSURE SUPPORT)
PREFILLED_SYRINGE | INTRAVENOUS | Status: AC
  Filled 2018-12-06: qty 10

## 2018-12-06 MED ORDER — OXYCODONE HCL 5 MG PO TABS: 5.0000 mg | ORAL_TABLET | ORAL | 0 refills | Status: DC | PRN

## 2018-12-06 MED ORDER — FENTANYL CITRATE (PF) 100 MCG/2ML IJ SOLN: 25.0000 ug | INTRAMUSCULAR | Status: DC | PRN

## 2018-12-06 MED ORDER — PHENYLEPHRINE 40 MCG/ML (10ML) SYRINGE FOR IV PUSH (FOR BLOOD PRESSURE SUPPORT)
PREFILLED_SYRINGE | INTRAVENOUS | Status: DC | PRN
  Administered 2018-12-06: 80 ug via INTRAVENOUS
  Administered 2018-12-06: 120 ug via INTRAVENOUS

## 2018-12-06 MED ORDER — FENTANYL CITRATE (PF) 100 MCG/2ML IJ SOLN
INTRAMUSCULAR | Status: AC
  Administered 2018-12-06: 50 ug via INTRAVENOUS
  Filled 2018-12-06: qty 2

## 2018-12-06 SURGICAL SUPPLY — 35 items
BANDAGE ACE 4X5 VEL STRL LF (GAUZE/BANDAGES/DRESSINGS) ×3 IMPLANT
BANDAGE ELASTIC 4 VELCRO ST LF (GAUZE/BANDAGES/DRESSINGS) ×2 IMPLANT
BLADE AVERAGE 25MMX9MM (BLADE) ×1
BLADE AVERAGE 25X9 (BLADE) ×1 IMPLANT
BNDG GAUZE ELAST 4 BULKY (GAUZE/BANDAGES/DRESSINGS) ×3 IMPLANT
CANISTER SUCT 3000ML PPV (MISCELLANEOUS) ×3 IMPLANT
COVER SURGICAL LIGHT HANDLE (MISCELLANEOUS) ×3 IMPLANT
COVER WAND RF STERILE (DRAPES) ×3 IMPLANT
DRAPE EXTREMITY T 121X128X90 (DRAPE) ×3 IMPLANT
DRAPE HALF SHEET 40X57 (DRAPES) ×3 IMPLANT
ELECT REM PT RETURN 9FT ADLT (ELECTROSURGICAL) ×3
ELECTRODE REM PT RTRN 9FT ADLT (ELECTROSURGICAL) ×1 IMPLANT
GAUZE SPONGE 4X4 12PLY STRL (GAUZE/BANDAGES/DRESSINGS) ×3 IMPLANT
GAUZE SPONGE 4X4 12PLY STRL LF (GAUZE/BANDAGES/DRESSINGS) ×2 IMPLANT
GLOVE BIO SURGEON STRL SZ7.5 (GLOVE) ×3 IMPLANT
GLOVE BIOGEL PI IND STRL 6.5 (GLOVE) IMPLANT
GLOVE BIOGEL PI IND STRL 7.0 (GLOVE) IMPLANT
GLOVE BIOGEL PI IND STRL 8 (GLOVE) ×1 IMPLANT
GLOVE BIOGEL PI INDICATOR 6.5 (GLOVE) ×2
GLOVE BIOGEL PI INDICATOR 7.0 (GLOVE) ×2
GLOVE BIOGEL PI INDICATOR 8 (GLOVE) ×2
GLOVE SURG SS PI 6.5 STRL IVOR (GLOVE) ×2 IMPLANT
GOWN STRL REUS W/ TWL LRG LVL3 (GOWN DISPOSABLE) ×3 IMPLANT
GOWN STRL REUS W/TWL LRG LVL3 (GOWN DISPOSABLE) ×9
KIT BASIN OR (CUSTOM PROCEDURE TRAY) ×3 IMPLANT
KIT TURNOVER KIT B (KITS) ×3 IMPLANT
NS IRRIG 1000ML POUR BTL (IV SOLUTION) ×3 IMPLANT
PACK GENERAL/GYN (CUSTOM PROCEDURE TRAY) ×3 IMPLANT
PAD ARMBOARD 7.5X6 YLW CONV (MISCELLANEOUS) ×6 IMPLANT
SUT ETHILON 3 0 PS 1 (SUTURE) ×3 IMPLANT
SUT VIC AB 3-0 SH 27 (SUTURE) ×3
SUT VIC AB 3-0 SH 27X BRD (SUTURE) IMPLANT
TOWEL GREEN STERILE (TOWEL DISPOSABLE) ×6 IMPLANT
UNDERPAD 30X30 (UNDERPADS AND DIAPERS) ×3 IMPLANT
WATER STERILE IRR 1000ML POUR (IV SOLUTION) ×3 IMPLANT

## 2018-12-06 NOTE — Anesthesia Procedure Notes (Signed)
Anesthesia Regional Block: Popliteal block   Pre-Anesthetic Checklist: ,, timeout performed, Correct Patient, Correct Site, Correct Laterality, Correct Procedure, Correct Position, site marked, Risks and benefits discussed,  Surgical consent,  Pre-op evaluation,  At surgeon's request and post-op pain management  Laterality: Left  Prep: chloraprep       Needles:  Injection technique: Single-shot  Needle Type: Echogenic Stimulator Needle     Needle Length: 10cm  Needle Gauge: 21     Additional Needles:   Procedures:,,,, ultrasound used (permanent image in chart),,,,  Narrative:  Start time: 12/06/2018 1:15 PM End time: 12/06/2018 1:19 PM Injection made incrementally with aspirations every 5 mL.  Performed by: Personally  Anesthesiologist: Lidia Collum, MD  Additional Notes: Monitors applied. Injection made in 5cc increments. No resistance to injection. Good needle visualization. Patient tolerated procedure well.

## 2018-12-06 NOTE — Progress Notes (Signed)
Patient's skin assessed by 2 nurses -  -open wound on right inner thigh - 3 1/2 cm x 2 cm and left inner thigh 5 cm x 3 cm;   - foam placed on both wounds, redness on sacrum, moisture associated skin damage in and around groin area; abrasions to right shin; bruising to left upper arm

## 2018-12-06 NOTE — Anesthesia Postprocedure Evaluation (Signed)
Anesthesia Post Note  Patient: [name]  Procedure(s) Performed: AMPUTATION DIGIT LEFT FIFTH TOE (Left Foot)     Patient location during evaluation: PACU Anesthesia Type: Regional Level of consciousness: awake and alert Pain management: pain level controlled Vital Signs Assessment: post-procedure vital signs reviewed and stable Respiratory status: spontaneous breathing and respiratory function stable Cardiovascular status: stable Postop Assessment: no apparent nausea or vomiting Anesthetic complications: no    Last Vitals:  Vitals:   12/06/18 1340 12/06/18 1515  BP: (!) 109/50   Pulse: 92   Resp: 17   Temp:  (!) 36.3 C  SpO2: 100%     Last Pain:  Vitals:   12/06/18 1515  TempSrc:   PainSc: [name]

## 2018-12-06 NOTE — Anesthesia Procedure Notes (Signed)
Procedure Name: MAC Date/Time: 12/06/2018 2:23 PM Performed by: Barrington Ellison, CRNA Pre-anesthesia Checklist: Patient identified, Emergency Drugs available, Suction available, Patient being monitored and Timeout performed Patient Re-evaluated:Patient Re-evaluated prior to induction Oxygen Delivery Method: Simple face mask

## 2018-12-06 NOTE — Interval H&P Note (Signed)
History and Physical Interval Note:  12/06/2018 1:46 PM  [name]  has presented today for surgery, with the diagnosis of left 5th toe gangrene  The various methods of treatment have been discussed with the patient and family. After consideration of risks, benefits and other options for treatment, the patient has consented to  Procedure(s): AMPUTATION DIGIT LEFT FIFTH TOE (Left) as a surgical intervention .  The patient's history has been reviewed, patient examined, no change in status, stable for surgery.  I have reviewed the patient's chart and labs.  Questions were answered to the patient's satisfaction.     Deitra Mayo

## 2018-12-06 NOTE — Op Note (Signed)
    NAME: [name]    MRN: [id number] DOB: 1941-03-27    DATE OF OPERATION: 12/06/2018  PREOP DIAGNOSIS:    Gangrene of the left fifth toe  POSTOP DIAGNOSIS:    Same  PROCEDURE:    Ray amputation left fifth toe  SURGEON: Judeth Cornfield. Scot Dock, MD, FACS  ASSIST: None  ANESTHESIA: Regional block  EBL: Minimal  INDICATIONS:    GRAESYN SCHREIFELS is a 77 y.o. male who was seen in the office with a gangrenous left fifth toe.  He was scheduled for left fifth toe amputation.  FINDINGS:   Reasonable bleeding of left fifth toe amputation site  TECHNIQUE:   The patient was taken to the operating room after a regional block was placed.  The left foot was prepped and draped in usual sterile fashion.  There was an extensive gangrenous wound involving the fifth toe.  Incision was made encompassing the necrotic tissue and the dissection carried back to the fifth metatarsal head.  The periosteum was elevated and the bone divided with a CD4 saw.  Given the amount of necrosis the wound could not be closed completely.  Hemostasis was obtained in the wound and I placed 2 interrupted 3-0 nylon sutures laterally to close the tissue over the bone.  The rest of the wound was then packed.  A sterile dressing was applied.  The patient tolerated the procedure well was transferred to the recovery room in stable condition.  All needle and sponge counts were correct.  Deitra Mayo, MD, FACS [facility]  DATE OF DICTATION:   12/06/2018

## 2018-12-06 NOTE — Transfer of Care (Signed)
Immediate Anesthesia Transfer of Care Note  Patient: [name]  Procedure(s) Performed: AMPUTATION DIGIT LEFT FIFTH TOE (Left Foot)  Patient Location: PACU  Anesthesia Type:MAC and Regional  Level of Consciousness: drowsy  Airway & Oxygen Therapy: Patient Spontanous Breathing and Patient connected to face mask oxygen  Post-op Assessment: Report given to RN  Post vital signs: Reviewed and stable  Last Vitals:  Vitals Value Taken Time  BP 81/55 12/06/2018  3:04 PM  Temp    Pulse 78 12/06/2018  3:05 PM  Resp 22 12/06/2018  3:05 PM  SpO2 100 % 12/06/2018  3:05 PM  Vitals shown include unvalidated device data.  Last Pain:  Vitals:   12/06/18 1330  TempSrc:   PainSc: 0-No pain      Patients Stated Pain Goal: 3 (85/02/77 4128)  Complications: No apparent anesthesia complications

## 2018-12-07 ENCOUNTER — Encounter (HOSPITAL_COMMUNITY): Payer: Self-pay | Admitting: Vascular Surgery

## 2018-12-07 DIAGNOSIS — I959 Hypotension, unspecified: Secondary | ICD-10-CM | POA: Insufficient documentation

## 2018-12-07 DIAGNOSIS — K219 Gastro-esophageal reflux disease without esophagitis: Secondary | ICD-10-CM | POA: Insufficient documentation

## 2018-12-08 LAB — CBC AND DIFFERENTIAL
HCT: 28 — AB (ref 41–53)
Hemoglobin: 9.3 — AB (ref 13.5–17.5)
Platelets: 105 — AB (ref 150–399)
WBC: 9.3

## 2018-12-08 LAB — BASIC METABOLIC PANEL
BUN: 32 — AB (ref 4–21)
Creatinine: 4 — AB (ref 0.6–1.3)
Glucose: 145
Potassium: 4 (ref 3.4–5.3)
Sodium: 143 (ref 137–147)

## 2018-12-12 ENCOUNTER — Encounter: Payer: Self-pay | Admitting: Family

## 2018-12-12 ENCOUNTER — Ambulatory Visit (HOSPITAL_COMMUNITY)
Admission: RE | Admit: 2018-12-12 | Discharge: 2018-12-12 | Disposition: A | Discharge status: Home / Self Care | Payer: PRIVATE HEALTH INSURANCE | Source: Ambulatory Visit | Attending: Family | Admitting: Family

## 2018-12-12 ENCOUNTER — Ambulatory Visit (INDEPENDENT_AMBULATORY_CARE_PROVIDER_SITE_OTHER): Payer: Medicare Other | Admitting: Family

## 2018-12-12 VITALS — BP 102/55 | HR 84 | Temp 97.3°F | Resp 16 | Ht 66.0 in | Wt 152.0 lb

## 2018-12-12 DIAGNOSIS — I739 Peripheral vascular disease, unspecified: Secondary | ICD-10-CM | POA: Insufficient documentation

## 2018-12-12 DIAGNOSIS — Z992 Dependence on renal dialysis: Secondary | ICD-10-CM

## 2018-12-12 DIAGNOSIS — I779 Disorder of arteries and arterioles, unspecified: Secondary | ICD-10-CM

## 2018-12-12 DIAGNOSIS — Z9862 Peripheral vascular angioplasty status: Secondary | ICD-10-CM

## 2018-12-12 DIAGNOSIS — N186 End stage renal disease: Secondary | ICD-10-CM

## 2018-12-12 DIAGNOSIS — Z89422 Acquired absence of other left toe(s): Secondary | ICD-10-CM

## 2018-12-12 NOTE — Patient Instructions (Signed)

## 2018-12-12 NOTE — Progress Notes (Signed)
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 male who is s/p Atherectomy with drug-eluting stentof left superficial femoral-popliteal artery, and angioplasty of left posterior tibial artery on 11-15-18 by Dr. Trula Slade for nonhealing wound of left fifth toe. He was then s/p ray amputation of left 5th toe on 12-06-18 for gangrene of left 5th toe by Dr. Scot Dock.   He reports pain in both feet.   Pt is a resident of [facility].  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    He returns today for follow up.   Pt is oriented to person only. I spoke with his medical POA and daughter, Ms. [name],that she needs to accompany him at his next visit, which should be ASAP, with one of our surgeons, to discuss intervention (s) to address the varying degrees of gangrene in his feet, and non healing wound at the left 5th toe amputation site.    Diabetic: Yes, however, last A1C result on file was normal at 4.2 on 11-11-18 Tobacco use: former smoker, quit in 2005   Pt meds include: Statin :Yes Betablocker: Yes ASA: No Other anticoagulants/antiplatelets: coumadin, likely for atrial fib  Past Medical History:  Diagnosis Date  . A-fib ([place])   . Anemia   . Blood transfusion   . BPH (benign prostatic hyperplasia)   . CHF (congestive heart failure) ([place])   . Diarrhea   . DM (diabetes mellitus) ([place])   . ESRD on hemodialysis ([place])    Started dialysis in 2009  . History of GI bleed    secondary to coumadin  . HTN (hypertension)   . Hyperlipidemia   . OSA (obstructive sleep apnea)    uses CPAP  . Secondary hyperparathyroidism of renal origin [facility])     Social History Social History   Tobacco Use  . Smoking status: Former Smoker    Types: Cigarettes    Last attempt to quit: 06/30/2004    Years since quitting: 14.4  . Smokeless tobacco: Never Used  Substance Use Topics  .  Alcohol use: No  . Drug use: No    Family History Family History  Problem Relation Age of Onset  . Alzheimer's disease Mother   . Diabetes Father        Amputation:  bilateral legs  . Cancer Daughter        breast cancer  . Diabetes Son   . Heart disease Son        before age 54  . Hypertension Son   . Anesthesia problems Neg Hx     Past Surgical History:  Procedure Laterality Date  . ABDOMINAL AORTOGRAM N/A 11/15/2018   Procedure: ABDOMINAL AORTOGRAM;  Surgeon: [name], MD;  Location: [place] [facility];  Service: Cardiovascular;  Laterality: N/A;  . AMPUTATION Left 12/06/2018   Procedure: AMPUTATION DIGIT LEFT FIFTH TOE;  Surgeon: Angelia Mould, MD;  Location: [place];  Service: Vascular;  Laterality: Left;  . BVT  7/40/81   Left  Basilic Vein Transposition  . CHOLECYSTECTOMY    . EYE SURGERY     Catarct bil  . INSERTION OF DIALYSIS CATHETER  05/28/2012   Procedure: INSERTION OF DIALYSIS CATHETER;  Surgeon: Mal Misty, MD;  Location: [place];  Service: Vascular;  Laterality: Right;  . Left arm shuntogram.    . Left forearm loop graft  with 6 mm Gore-Tex graft.    . LOWER EXTREMITY ANGIOGRAPHY Bilateral 11/15/2018   Procedure: LOWER EXTREMITY ANGIOGRAPHY;  Surgeon: [name], MD;  Location: [place] [facility];  Service: Cardiovascular;  Laterality: Bilateral;  . Pars plana vitrectomy with 25-gauge system    . PERIPHERAL VASCULAR ATHERECTOMY Left 11/15/2018   Procedure: PERIPHERAL VASCULAR ATHERECTOMY;  Surgeon: [name], MD;  Location: [place] [facility];  Service: Cardiovascular;  Laterality: Left;  SFA with STENT  . PERIPHERAL VASCULAR BALLOON ANGIOPLASTY Left 11/15/2018   Procedure: PERIPHERAL VASCULAR BALLOON ANGIOPLASTY;  Surgeon: [name], MD;  Location: [place] [facility];  Service: Cardiovascular;  Laterality: Left;  PT    Allergies  Allergen Reactions  . Tape Itching and Other (See Comments)    Cloth tape only    Current  Outpatient Medications  Medication Sig Dispense Refill  . acetaminophen (TYLENOL) 500 MG tablet Take 1,000 mg by mouth 3 (three) times daily.    . Amino Acids-Protein Hydrolys (FEEDING SUPPLEMENT, PRO-STAT SUGAR FREE 64,) LIQD Take 30 mLs by mouth daily. (Patient taking differently: Take 30 mLs by mouth every evening. ) 900 mL 0  . atorvastatin (LIPITOR) 20 MG tablet Take 1 tablet (20 mg total) by mouth daily at 6 PM. 30 tablet 0  . calcium acetate (PHOSLO) 667 MG capsule Take 4 capsules (2,668 mg total) by mouth 3 (three) times daily with meals. 90 capsule 0  . doxercalciferol (HECTOROL) 4 MCG/2ML injection Inject 1 mL (2 mcg total) into the vein every Monday, Wednesday, and Friday with hemodialysis. 2 mL   . ferrous gluconate (FERGON) 240 (27 FE) MG tablet Take 1 tablet (240 mg total) by mouth 3 (three) times daily with meals. 90 each 0  . loperamide (IMODIUM A-D) 2 MG tablet Take 4 mg by mouth See admin instructions. Give 4 mg after 1st loose stool as needed for diarrhea    . metoprolol tartrate (LOPRESSOR) 25 MG tablet Take 25 mg by mouth 2 (two) times daily. Do not give before dialysis Mon, Wed, and Fri    . midodrine (PROAMATINE) 10 MG tablet Take 10 mg by mouth every Monday, Wednesday, and Friday. 30 minutes prior to dialysis treatment    . multivitamin (RENA-VIT) TABS tablet Take 1 tablet by mouth at bedtime. (Patient taking differently: Take 1 tablet by mouth daily. ) 30 tablet 0  . oxyCODONE (ROXICODONE) 5 MG immediate release tablet Take 1 tablet (5 mg total) by mouth every 4 (four) hours as needed. 20 tablet 0  . pantoprazole (PROTONIX) 40 MG tablet Take 1 tablet (40 mg total) by mouth 2 (two) times daily. 60 tablet 0  . warfarin (COUMADIN) 5 MG tablet Take 1 tablet (5 mg total) by mouth daily at 6 PM. 30 tablet 0   No current facility-administered medications for this visit.     ROS: See HPI for pertinent positives and negatives.   Physical Examination  Vitals:   12/12/18 1245    BP: (!) 102/55  Pulse: 84  Resp: 16  Temp: (!) 97.3 F (36.3 C)  SpO2: 96%  Weight: 152 lb (68.9 kg)  Height: 5\' 6"  (1.676 m)   Body mass index is 24.53 kg/m.  General: A&O x 1 (to person only), WDWN, male. Gait: seated in w/c HENT: No gross abnormalities.  Eyes: PERRLA. Pulmonary: Respirations are non labored, fair air movement in all fields, no rales, rhonchi, or wheezes. Cardiac: irregular rhythm, no detected murmur.  Carotid Bruits Right Left   Negative Negative   Radial pulses are not palpable bilaterally   Palpable thrill at left upper arm AV fistula HD access Adominal aortic pulse is not palpable                          Left foot, lateral view  Left foot    Right foot      VASCULAR EXAM: Extremities with ischemic changes: all toes of both feet, see above photes, with various stages of wet gangrene in all toes of both feet; with open wounds of left fith toe amputation site, and right heel desquamation. Left forefoot with dark discoloration. Left 5th toe ray amputation site is not healing, has foul odor. See photos above.                                                                                                          LE Pulses Right Left       FEMORAL  not palpable seated in w/c  not palpable        POPLITEAL  not palpable   not palpable       POSTERIOR TIBIAL  not palpable   not palpable        DORSALIS PEDIS      ANTERIOR TIBIAL not palpable  not palpable    Abdomen: soft, NT, no palpable masses. Skin: no rashes, see Extremities Musculoskeletal: no muscle wasting or atrophy.  Neurologic: A&O X 3; appropriate affect, Sensation is normal; MOTOR FUNCTION:  moving all extremities equally, motor strength 3/5 throughout. Speech is terse, CN 2-12 intact. Psychiatric: Thought content is disortiented, mood appropriate for clinical situation.     ASSESSMENT: [name] is a 77 y.o. male who is s/p Atherectomy with drug-eluting  stentof left superficial femoral-popliteal artery, and angioplasty of left posterior tibial artery on 11-15-18 by Dr. Trula Slade for nonhealing wound of left fifth toe. He was then s/p ray amputation of left 5th toe on 12-06-18 for gangrene of left 5th toe by Dr. Scot Dock.    I spoke with Dr. Carlis Abbott. Pt most likely needs a left AKA, as it is unlikely that a left BKA would heal.  Pt however is only oriented x 1 and is not competent to make decisions re surgery.   Penni Homans, RN, left a phone message with pt daughter, Ms. [name], at the phone number listed on the demographic page from the nursing facility. Silvadene dressing applied to the left 5th toe open wound.  I spoke with his medical POA and daughter, Ms. [name],that she needs to accompany him at his next visit, which should be ASAP, with one of our surgeons, to discuss intervention (s) to address the varying degrees of gangrene in his feet, and non healing wound at the left 5th toe amputation site.  His daughter stated that pt tried to walk with a walker, but was unsteady, and was falling.  Pt will likely need a left AKA, then after that heals, a right TMA, if  the ischemia does not progress more proximally.   DATA  12-12-18 ABI Findings:  +--------+------------------+-----+----------+--------+  Right  Rt Pressure (mmHg)IndexWaveform Comment   +--------+------------------+-----+----------+--------+  [id number]                      +--------+------------------+-----+----------+--------+  PTA   98        1.01 monophasic      +--------+------------------+-----+----------+--------+  DP   99        1.02 monophasic      +--------+------------------+-----+----------+--------+    +--------+------------------+-----+----------+-------+  Left  Lt Pressure (mmHg)IndexWaveform Comment  +--------+------------------+-----+----------+-------+  Brachial                  fistula  +--------+------------------+-----+----------+-------+  PTA   156        1.61 monophasic      +--------+------------------+-----+----------+-------+  DP   93        0.96 monophasic      +--------+------------------+-----+----------+-------+    +-------+-----------+-----------+------------+------------+  ABI/TBIToday's ABIToday's TBIPrevious ABIPrevious TBI  +-------+-----------+-----------+------------+------------+  Right 1.02          0.63            +-------+-----------+-----------+------------+------------+  Left  1.61          0.58            +-------+-----------+-----------+------------+------------+    Summary:  Right: Resting right ankle-brachial index indicates noncompressible right lower extremity arteries.    Left: Resting left ankle-brachial index indicates noncompressible left lower extremity arteries.    Unable to obtain toe-brachial indeces due to bandages and movement.  Technically difficult exam, exam performed in wheel chair.   Ultrasound technologist spoke with me, she was unable to obtain left lower extremity arterial duplex.     PLAN:  Based on the patient's vascular studies, HPI, and examination, pt will return to clinic with his next of kin or medical POA ASAP to discuss with one of our surgeons possible left AKA.  Daily silvadene dressing change to left 5th toe amputation site, to be done at [facility].   I discussed in depth with the patient the nature of atherosclerosis, and emphasized the importance of maximal medical management including strict control of blood pressure, blood glucose, and lipid levels, obtaining regular exercise, and continued cessation of smoking.  The patient is aware that without maximal medical management the underlying atherosclerotic disease process will progress, limiting the benefit of any  interventions.  The patient was given information about PAD including signs, symptoms, treatment, what symptoms should prompt the patient to seek immediate medical care, and risk reduction measures to take.  Clemon Chambers, RN, MSN, FNP-C [facility] Phone: [phone]  Clinic MD: Carlis Abbott on call  12/12/18 1:32 PM

## 2018-12-20 ENCOUNTER — Non-Acute Institutional Stay (SKILLED_NURSING_FACILITY): Payer: Medicare Other | Admitting: Internal Medicine

## 2018-12-20 DIAGNOSIS — H1033 Unspecified acute conjunctivitis, bilateral: Secondary | ICD-10-CM | POA: Diagnosis not present

## 2018-12-21 ENCOUNTER — Encounter: Payer: Self-pay | Admitting: Internal Medicine

## 2018-12-21 NOTE — Progress Notes (Signed)
Location:  [place] of Service:  SNF (31)  Hennie Duos, MD  Patient Care Team: Hennie Duos, MD as PCP - General (Internal Medicine) [name], MD as Consulting Physician (Nephrology) Rehab, [facility], [facility]  Extended Emergency Contact Information Primary Emergency Contact: [name],[name] Address: [place], [place] [address] [name] of [place] Phone: [phone] Work Phone: [phone] Mobile Phone: [phone] Relation: Daughter    Allergies: Tape  Chief Complaint  Patient presents with  . Acute Visit    HPI: Patient is 77 y.o. male who nursing asked me to see because it was noted today that patient's eyes were red and there was some discharge.  Patient has not had a cold or cough, no fever no sneezing no nausea vomiting or diarrhea.  Past Medical History:  Diagnosis Date  . A-fib ([place])   . Anemia   . Blood transfusion   . BPH (benign prostatic hyperplasia)   . CHF (congestive heart failure) ([place])   . Diarrhea   . DM (diabetes mellitus) ([place])   . ESRD on hemodialysis ([place])    Started dialysis in 2009  . History of GI bleed    secondary to coumadin  . HTN (hypertension)   . Hyperlipidemia   . OSA (obstructive sleep apnea)    uses CPAP  . Secondary hyperparathyroidism of renal origin [facility])     Past Surgical History:  Procedure Laterality Date  . ABDOMINAL AORTOGRAM N/A 11/15/2018   Procedure: ABDOMINAL AORTOGRAM;  Surgeon: [name], MD;  Location: [place] CV LAB;  Service: Cardiovascular;  Laterality: N/A;  . AMPUTATION Left 12/06/2018   Procedure: AMPUTATION DIGIT LEFT FIFTH TOE;  Surgeon: Angelia Mould, MD;  Location: [place];  Service: Vascular;  Laterality: Left;  . BVT  0/10/93   Left  Basilic Vein Transposition  . CHOLECYSTECTOMY    . EYE SURGERY     Catarct bil  . INSERTION OF DIALYSIS CATHETER   05/28/2012   Procedure: INSERTION OF DIALYSIS CATHETER;  Surgeon: Mal Misty, MD;  Location: [facility];  Service: Vascular;  Laterality: Right;  . Left arm shuntogram.    . Left forearm loop graft with 6 mm Gore-Tex graft.    . LOWER EXTREMITY ANGIOGRAPHY Bilateral 11/15/2018   Procedure: LOWER EXTREMITY ANGIOGRAPHY;  Surgeon: [name], MD;  Location: [place] [facility];  Service: Cardiovascular;  Laterality: Bilateral;  . Pars plana vitrectomy with 25-gauge system    . PERIPHERAL VASCULAR ATHERECTOMY Left 11/15/2018   Procedure: PERIPHERAL VASCULAR ATHERECTOMY;  Surgeon: [name], MD;  Location: [place] [facility];  Service: Cardiovascular;  Laterality: Left;  SFA with STENT  . PERIPHERAL VASCULAR BALLOON ANGIOPLASTY Left 11/15/2018   Procedure: PERIPHERAL VASCULAR BALLOON ANGIOPLASTY;  Surgeon: [name], MD;  Location: [place] [facility];  Service: Cardiovascular;  Laterality: Left;  PT    Allergies as of 12/20/2018      Reactions   Tape Itching, Other (See Comments)   Cloth tape only      Medication List       Accurate as of December 20, 2018 11:59 PM. Always use your most recent med list.        acetaminophen 500 MG tablet Commonly known as:  TYLENOL Take 1,000 mg by mouth 3 (three) times daily.   atorvastatin 20 MG tablet Commonly  known as:  LIPITOR Take 1 tablet (20 mg total) by mouth daily at 6 PM.   calcium acetate 667 MG capsule Commonly known as:  PHOSLO Take 4 capsules (2,668 mg total) by mouth 3 (three) times daily with meals.   doxercalciferol 4 MCG/2ML injection Commonly known as:  HECTOROL Inject 1 mL (2 mcg total) into the vein every Monday, Wednesday, and Friday with hemodialysis.   ferrous gluconate 240 (27 FE) MG tablet Commonly known as:  FERGON Take 1 tablet (240 mg total) by mouth 3 (three) times daily with meals.   loperamide 2 MG tablet Commonly known as:  IMODIUM A-D Take 4 mg by mouth See admin instructions. Give 4 mg  after 1st loose stool as needed for diarrhea   metoprolol tartrate 25 MG tablet Commonly known as:  LOPRESSOR Take 25 mg by mouth 2 (two) times daily. Do not give before dialysis Mon, Wed, and Fri   midodrine 10 MG tablet Commonly known as:  PROAMATINE Take 10 mg by mouth every Monday, Wednesday, and Friday. 30 minutes prior to dialysis treatment   oxyCODONE 5 MG immediate release tablet Commonly known as:  ROXICODONE Take 1 tablet (5 mg total) by mouth every 4 (four) hours as needed.   pantoprazole 40 MG tablet Commonly known as:  PROTONIX Take 1 tablet (40 mg total) by mouth 2 (two) times daily.   warfarin 5 MG tablet Commonly known as:  COUMADIN Take 1 tablet (5 mg total) by mouth daily at 6 PM.       No orders of the defined types were placed in this encounter.    There is no immunization history on file for this patient.  Social History   Tobacco Use  . Smoking status: Former Smoker    Types: Cigarettes    Last attempt to quit: 06/30/2004    Years since quitting: 14.4  . Smokeless tobacco: Never Used  Substance Use Topics  . Alcohol use: No    Review of Systems  DATA OBTAINED: from nurse GENERAL:  no fevers, fatigue, appetite changes SKIN: No itching, rash HEENT: Both eyes are red RESPIRATORY: No cough, wheezing, SOB CARDIAC: No chest pain, palpitations, lower extremity edema  GI: No abdominal pain, No N/V/D or constipation, No heartburn or reflux  GU: No dysuria, frequency or urgency, or incontinence  MUSCULOSKELETAL: No unrelieved bone/joint pain NEUROLOGIC: No headache, dizziness  PSYCHIATRIC: No overt anxiety or sadness  Vitals:   12/21/18 2155  BP: 131/71  Pulse: 98  Resp: 18  Temp: (!) 97.5 F (36.4 C)   There is no height or weight on file to calculate BMI. Physical Exam  GENERAL APPEARANCE: Alert, moderately conversant, No acute distress  SKIN: No diaphoresis rash HEENT: Conjunctiva are injected bilaterally left greater than right, left  eye with greenish exudate at medial canthus RESPIRATORY: Breathing is even, unlabored. Lung sounds are clear   CARDIOVASCULAR: Heart RRR no murmurs, rubs or gallops. No peripheral edema  GASTROINTESTINAL: Abdomen is soft, non-tender, not distended w/ normal bowel sounds.  GENITOURINARY: Bladder non tender, not distended  MUSCULOSKELETAL: No abnormal joints or musculature NEUROLOGIC: Cranial nerves 2-12 grossly intact. 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 Hypotension 12/07/2018  . GERD (gastroesophageal reflux disease) 12/07/2018  . Pressure injury of skin 12/03/2018  . Symptomatic anemia 12/02/2018  . Acute on chronic blood loss anemia 12/02/2018  . 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  [place]) 12/02/2018  . Lower GI bleeding 12/02/2018  . Heme positive stool   . Acute on chronic diastolic (congestive) heart failure ([place]) 11/19/2018  . Acute CVA (cerebrovascular accident) ([place]) 11/19/2018  . Hypoglycemia 11/19/2018  . Peripheral vascular disease due to secondary diabetes ([place]) 11/19/2018  . Dry gangrene ([place]) 11/19/2018  . 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 DNR (do not resuscitate) discussion   . Goals of care, counseling/discussion   . Palliative care by specialist   . Acute hypoxemic respiratory failure ([place]) 11/06/2018  . Volume overload 11/06/2018  . Multifocal pneumonia 11/06/2018  . Acute metabolic encephalopathy 70/48/8891  . Physical deconditioning 11/06/2018  . Acute respiratory failure with hypoxia and hypercapnia ([place]) 11/04/2018  . CAP (community acquired pneumonia) 11/04/2018  . Pleural effusion, right 11/04/2018  . Increased ammonia level 11/04/2018  . Abnormal CT of the abdomen 11/04/2018  . Atrial fibrillation with RVR ([place]) 11/04/2018  . Sepsis ([place]) 10/21/2018  . Acute encephalopathy 10/21/2018  . Elevated alkaline phosphatase level  10/21/2018  . Chronic anemia 10/21/2018  . Thrombocytopenia ([place]) 10/21/2018  . Pulmonary HTN ([place]) 03/04/2017  . Aortic valve stenosis 03/04/2017  . Hypertension, accelerated with heart disease, without CHF 01/12/2017  . Dyspnea on exertion 01/12/2017  . ESRD (end stage renal disease) ([place]) 06/07/2012  . Hyperlipidemia 04/24/2011  . HYPERTENSION, BENIGN 10/24/2009  . DM (diabetes mellitus) ([place]) 10/23/2009  . OBSTRUCTIVE SLEEP APNEA 10/23/2009  . PAF (paroxysmal atrial fibrillation) ([place]) 10/23/2009  . CHF (congestive heart failure) ([place]) 10/23/2009    CMP     Component Value Date/Time   NA 143 12/08/2018   K 4.0 12/08/2018   CL 99 12/04/2018 0518   CO2 24 12/04/2018 0518   GLUCOSE 134 (H) 12/04/2018 0518   BUN 32 (A) 12/08/2018   CREATININE 4.0 (A) 12/08/2018   CREATININE 5.53 (H) 12/04/2018 0518   CALCIUM 8.6 (L) 12/04/2018 0518   CALCIUM 8.3 (L) 10/25/2008 1450   PROT 6.6 12/01/2018 2353   ALBUMIN 2.6 (L) 12/04/2018 0518   AST 195 (H) 12/01/2018 2353   ALT 231 (H) 12/01/2018 2353   ALKPHOS 293 (H) 12/01/2018 2353   BILITOT 1.1 12/01/2018 2353   GFRNONAA 9 (L) 12/04/2018 0518   GFRAA 11 (L) 12/04/2018 0518   Recent Labs    11/08/18 0016 11/09/18 0230  11/14/18 0241  12/01/18 2353 12/03/18 0746 12/04/18 0518 12/08/18  NA 138 137   < > 135   < > 140 143 141 143  K 3.8 4.1   < > 5.0   < > 3.5 3.6 3.6 4.0  CL 100 100   < > 97*   < > 97* 100 99  --   CO2 26 27   < > 25   < > 22 23 24   --   GLUCOSE 105* 94   < > 139*   < > 128* 108* 134*  --   BUN 15 39*   < > 72*   < > 82* 96* 47* 32*  CREATININE 3.19* 5.34*   < > 7.61*   < > 6.56* 8.49* 5.53* 4.0*  CALCIUM 8.8* 9.1   < > 9.8   < > 8.7* 8.4* 8.6*  --   MG 2.0 2.2  --  2.4  --   --   --   --   --   PHOS  --   --    < > 2.4*  --   --  8.3* 4.5  --    < > =  values in this interval not displayed.   Recent Labs    11/07/18 2046 11/09/18 0230  12/01/18 2353 12/03/18 0746 12/04/18 0518  AST 26 20  --  195*  --    --   ALT 27 26  --  231*  --   --   ALKPHOS 198* 197*  --  293*  --   --   BILITOT 1.1 0.8  --  1.1  --   --   PROT 7.0 6.9  --  6.6  --   --   ALBUMIN 3.0* 2.8*   < > 2.9* 3.0* 2.6*   < > = values in this interval not displayed.   Recent Labs    11/17/18 0242 12/01/18 2353  12/03/18 0744 12/04/18 0518 12/06/18 1216 12/08/18  WBC 10.7* 14.6*   < > 11.9* 10.4 9.7 9.3  NEUTROABS 7.4 11.8*  --   --  7.8*  --   --   HGB 8.7* 6.8*   < > 9.4* 9.5* 10.1* 9.3*  HCT 27.0* 23.7*   < > 30.0* 30.6* 34.7* 28*  MCV 96.4 117.9*   < > 103.4* 106.3* 110.9*  --   PLT 133* 150   < > 102* 87* 85* 105*   < > = values in this interval not displayed.   Recent Labs    11/11/18 1517  CHOL 113  LDLCALC 62  TRIG 60   No results found for: [facility] Lab Results  Component Value Date   TSH 1.956 11/09/2018   Lab Results  Component Value Date   HGBA1C 4.2 (L) 11/11/2018   Lab Results  Component Value Date   CHOL 113 11/11/2018   HDL 39 (L) 11/11/2018   LDLCALC 62 11/11/2018   TRIG 60 11/11/2018   CHOLHDL 2.9 11/11/2018    Significant Diagnostic Results in last 30 days:  Ct Head Wo Contrast  Result Date: 12/02/2018 CLINICAL DATA:  77 year old male with fall. EXAM: CT HEAD WITHOUT CONTRAST TECHNIQUE: Contiguous axial images were obtained from the base of the skull through the vertex without intravenous contrast. COMPARISON:  Head CT dated 10/20/2018 FINDINGS: Evaluation of this exam is limited due to motion artifact. Brain: There is moderate age-related atrophy and chronic microvascular ischemic changes. Left parietal old infarct noted. There is no acute intracranial hemorrhage. No mass effect or midline shift. No extra-axial fluid collection. Vascular: No hyperdense vessel or unexpected calcification. Skull: Normal. Negative for fracture or focal lesion. Sinuses/Orbits: The visualized paranasal sinuses are clear. Mild bilateral mastoid effusions noted. Other: None IMPRESSION: 1. No definite  acute intracranial hemorrhage on this motion degraded exam. 2. Moderate age-related atrophy and chronic microvascular ischemic changes. Old left parietal infarct. Electronically Signed   By: Anner Crete M.D.   On: 12/02/2018 00:24   Vas [place] Burnard Bunting With/wo Tbi  Result Date: 12/13/2018 LOWER EXTREMITY DOPPLER STUDY Indications: Peripheral artery disease, and Bilateral foot bandage. High Risk Factors: Hypertension, Diabetes, past history of smoking.  Vascular Interventions: Atherectomy left superficial femoral-popliteal artery,                         angioplasty left posterior tibial artery 11/15/2018. Performing Technologist: Alvia Grove RVT  Examination Guidelines: A complete evaluation includes at minimum, Doppler waveform signals and systolic blood pressure reading at the level of bilateral brachial, anterior tibial, and posterior tibial arteries, when vessel segments are accessible. Bilateral testing is considered an integral part of a complete examination. Photoelectric Plethysmograph (PPG)  waveforms and toe systolic pressure readings are included as required and additional duplex testing as needed. Limited examinations for reoccurring indications may be performed as noted.  ABI Findings: +--------+------------------+-----+----------+--------+ Right   Rt Pressure (mmHg)IndexWaveform  Comment  +--------+------------------+-----+----------+--------+ [id number]                                        +--------+------------------+-----+----------+--------+ PTA     98                1.01 monophasic         +--------+------------------+-----+----------+--------+ DP      99                1.02 monophasic         +--------+------------------+-----+----------+--------+ +--------+------------------+-----+----------+-------+ Left    Lt Pressure (mmHg)IndexWaveform  Comment +--------+------------------+-----+----------+-------+ Brachial                                 fistula  +--------+------------------+-----+----------+-------+ PTA     156               1.61 monophasic        +--------+------------------+-----+----------+-------+ DP      93                0.96 monophasic        +--------+------------------+-----+----------+-------+ +-------+-----------+-----------+------------+------------+ ABI/TBIToday's ABIToday's TBIPrevious ABIPrevious TBI +-------+-----------+-----------+------------+------------+ Right  1.02                  0.63                     +-------+-----------+-----------+------------+------------+ Left   1.61                  0.58                     +-------+-----------+-----------+------------+------------+  Summary: Right: Resting right ankle-brachial index indicates noncompressible right lower extremity arteries. Left: Resting left ankle-brachial index indicates noncompressible left lower extremity arteries. Unable to obtain toe-brachial indeces due to bandages and movement. Technically difficult exam, exam performed in wheel chair.   *See table(s) above for measurements and observations.   Electronically signed by Monica Martinez MD on 12/13/2018 at 9:57:54 AM.    Final     Assessment and Plan  Bilateral conjunctivitis- Garamycin drops to GTS 3 times daily x7 days; will monitor progress    Inocencio Homes, MD

## 2018-12-22 LAB — BASIC METABOLIC PANEL
BUN: 65 — AB (ref 4–21)
Creatinine: 7.4 — AB (ref 0.6–1.3)
Glucose: 141
Potassium: 3.8 (ref 3.4–5.3)
Sodium: 144 (ref 137–147)

## 2018-12-23 ENCOUNTER — Emergency Department (HOSPITAL_COMMUNITY): Payer: Medicare Other

## 2018-12-23 ENCOUNTER — Encounter (HOSPITAL_COMMUNITY): Payer: Self-pay | Admitting: *Deleted

## 2018-12-23 ENCOUNTER — Other Ambulatory Visit: Payer: Self-pay

## 2018-12-23 ENCOUNTER — Inpatient Hospital Stay (HOSPITAL_COMMUNITY)
Admission: EM | Admit: 2018-12-23 | Discharge: 2019-01-03 | DRG: 853 | Disposition: A | Discharge status: Skilled Nursing Facility | Payer: Medicare Other | Attending: Internal Medicine | Admitting: Internal Medicine

## 2018-12-23 ENCOUNTER — Telehealth: Payer: Self-pay | Admitting: Vascular Surgery

## 2018-12-23 DIAGNOSIS — J9600 Acute respiratory failure, unspecified whether with hypoxia or hypercapnia: Secondary | ICD-10-CM | POA: Diagnosis not present

## 2018-12-23 DIAGNOSIS — G9340 Encephalopathy, unspecified: Secondary | ICD-10-CM

## 2018-12-23 DIAGNOSIS — R402253 Coma scale, best verbal response, oriented, at hospital admission: Secondary | ICD-10-CM | POA: Diagnosis present

## 2018-12-23 DIAGNOSIS — J189 Pneumonia, unspecified organism: Secondary | ICD-10-CM | POA: Diagnosis present

## 2018-12-23 DIAGNOSIS — Z95828 Presence of other vascular implants and grafts: Secondary | ICD-10-CM

## 2018-12-23 DIAGNOSIS — E1152 Type 2 diabetes mellitus with diabetic peripheral angiopathy with gangrene: Secondary | ICD-10-CM | POA: Diagnosis present

## 2018-12-23 DIAGNOSIS — R6520 Severe sepsis without septic shock: Secondary | ICD-10-CM | POA: Diagnosis present

## 2018-12-23 DIAGNOSIS — L97519 Non-pressure chronic ulcer of other part of right foot with unspecified severity: Secondary | ICD-10-CM | POA: Diagnosis present

## 2018-12-23 DIAGNOSIS — Z66 Do not resuscitate: Secondary | ICD-10-CM | POA: Diagnosis present

## 2018-12-23 DIAGNOSIS — R402143 Coma scale, eyes open, spontaneous, at hospital admission: Secondary | ICD-10-CM | POA: Diagnosis present

## 2018-12-23 DIAGNOSIS — I5032 Chronic diastolic (congestive) heart failure: Secondary | ICD-10-CM | POA: Diagnosis present

## 2018-12-23 DIAGNOSIS — Z8673 Personal history of transient ischemic attack (TIA), and cerebral infarction without residual deficits: Secondary | ICD-10-CM

## 2018-12-23 DIAGNOSIS — Z89612 Acquired absence of left leg above knee: Secondary | ICD-10-CM | POA: Diagnosis not present

## 2018-12-23 DIAGNOSIS — E46 Unspecified protein-calorie malnutrition: Secondary | ICD-10-CM | POA: Diagnosis present

## 2018-12-23 DIAGNOSIS — L97529 Non-pressure chronic ulcer of other part of left foot with unspecified severity: Secondary | ICD-10-CM | POA: Diagnosis present

## 2018-12-23 DIAGNOSIS — I480 Paroxysmal atrial fibrillation: Secondary | ICD-10-CM | POA: Diagnosis not present

## 2018-12-23 DIAGNOSIS — I70234 Atherosclerosis of native arteries of right leg with ulceration of heel and midfoot: Secondary | ICD-10-CM | POA: Diagnosis not present

## 2018-12-23 DIAGNOSIS — G9349 Other encephalopathy: Secondary | ICD-10-CM | POA: Diagnosis present

## 2018-12-23 DIAGNOSIS — I70202 Unspecified atherosclerosis of native arteries of extremities, left leg: Secondary | ICD-10-CM | POA: Diagnosis present

## 2018-12-23 DIAGNOSIS — E8889 Other specified metabolic disorders: Secondary | ICD-10-CM | POA: Diagnosis present

## 2018-12-23 DIAGNOSIS — Z89611 Acquired absence of right leg above knee: Secondary | ICD-10-CM

## 2018-12-23 DIAGNOSIS — I132 Hypertensive heart and chronic kidney disease with heart failure and with stage 5 chronic kidney disease, or end stage renal disease: Secondary | ICD-10-CM | POA: Diagnosis present

## 2018-12-23 DIAGNOSIS — Z751 Person awaiting admission to adequate facility elsewhere: Secondary | ICD-10-CM

## 2018-12-23 DIAGNOSIS — L089 Local infection of the skin and subcutaneous tissue, unspecified: Secondary | ICD-10-CM | POA: Diagnosis present

## 2018-12-23 DIAGNOSIS — I10 Essential (primary) hypertension: Secondary | ICD-10-CM | POA: Diagnosis present

## 2018-12-23 DIAGNOSIS — Z515 Encounter for palliative care: Secondary | ICD-10-CM | POA: Diagnosis present

## 2018-12-23 DIAGNOSIS — S91302A Unspecified open wound, left foot, initial encounter: Secondary | ICD-10-CM

## 2018-12-23 DIAGNOSIS — A419 Sepsis, unspecified organism: Secondary | ICD-10-CM

## 2018-12-23 DIAGNOSIS — N400 Enlarged prostate without lower urinary tract symptoms: Secondary | ICD-10-CM | POA: Diagnosis present

## 2018-12-23 DIAGNOSIS — Z79899 Other long term (current) drug therapy: Secondary | ICD-10-CM

## 2018-12-23 DIAGNOSIS — Z7901 Long term (current) use of anticoagulants: Secondary | ICD-10-CM

## 2018-12-23 DIAGNOSIS — G4733 Obstructive sleep apnea (adult) (pediatric): Secondary | ICD-10-CM | POA: Diagnosis not present

## 2018-12-23 DIAGNOSIS — I4891 Unspecified atrial fibrillation: Secondary | ICD-10-CM | POA: Diagnosis present

## 2018-12-23 DIAGNOSIS — I509 Heart failure, unspecified: Secondary | ICD-10-CM

## 2018-12-23 DIAGNOSIS — E11621 Type 2 diabetes mellitus with foot ulcer: Secondary | ICD-10-CM | POA: Diagnosis present

## 2018-12-23 DIAGNOSIS — E1122 Type 2 diabetes mellitus with diabetic chronic kidney disease: Secondary | ICD-10-CM | POA: Diagnosis present

## 2018-12-23 DIAGNOSIS — I70245 Atherosclerosis of native arteries of left leg with ulceration of other part of foot: Secondary | ICD-10-CM | POA: Diagnosis not present

## 2018-12-23 DIAGNOSIS — Z781 Physical restraint status: Secondary | ICD-10-CM

## 2018-12-23 DIAGNOSIS — A414 Sepsis due to anaerobes: Secondary | ICD-10-CM | POA: Diagnosis present

## 2018-12-23 DIAGNOSIS — Z992 Dependence on renal dialysis: Secondary | ICD-10-CM

## 2018-12-23 DIAGNOSIS — I998 Other disorder of circulatory system: Secondary | ICD-10-CM | POA: Diagnosis present

## 2018-12-23 DIAGNOSIS — N186 End stage renal disease: Secondary | ICD-10-CM | POA: Diagnosis present

## 2018-12-23 DIAGNOSIS — F0390 Unspecified dementia without behavioral disturbance: Secondary | ICD-10-CM | POA: Diagnosis present

## 2018-12-23 DIAGNOSIS — L97209 Non-pressure chronic ulcer of unspecified calf with unspecified severity: Secondary | ICD-10-CM | POA: Diagnosis present

## 2018-12-23 DIAGNOSIS — Z1624 Resistance to multiple antibiotics: Secondary | ICD-10-CM | POA: Diagnosis present

## 2018-12-23 DIAGNOSIS — R402363 Coma scale, best motor response, obeys commands, at hospital admission: Secondary | ICD-10-CM | POA: Diagnosis present

## 2018-12-23 DIAGNOSIS — E785 Hyperlipidemia, unspecified: Secondary | ICD-10-CM | POA: Diagnosis present

## 2018-12-23 DIAGNOSIS — I482 Chronic atrial fibrillation, unspecified: Secondary | ICD-10-CM | POA: Diagnosis not present

## 2018-12-23 DIAGNOSIS — Y95 Nosocomial condition: Secondary | ICD-10-CM | POA: Diagnosis present

## 2018-12-23 DIAGNOSIS — H109 Unspecified conjunctivitis: Secondary | ICD-10-CM | POA: Diagnosis present

## 2018-12-23 DIAGNOSIS — Z7189 Other specified counseling: Secondary | ICD-10-CM

## 2018-12-23 DIAGNOSIS — K279 Peptic ulcer, site unspecified, unspecified as acute or chronic, without hemorrhage or perforation: Secondary | ICD-10-CM | POA: Diagnosis present

## 2018-12-23 DIAGNOSIS — D689 Coagulation defect, unspecified: Secondary | ICD-10-CM | POA: Diagnosis present

## 2018-12-23 DIAGNOSIS — L97419 Non-pressure chronic ulcer of right heel and midfoot with unspecified severity: Secondary | ICD-10-CM | POA: Diagnosis present

## 2018-12-23 DIAGNOSIS — Z91048 Other nonmedicinal substance allergy status: Secondary | ICD-10-CM

## 2018-12-23 DIAGNOSIS — Z6824 Body mass index (BMI) 24.0-24.9, adult: Secondary | ICD-10-CM

## 2018-12-23 DIAGNOSIS — N2581 Secondary hyperparathyroidism of renal origin: Secondary | ICD-10-CM | POA: Diagnosis present

## 2018-12-23 DIAGNOSIS — K219 Gastro-esophageal reflux disease without esophagitis: Secondary | ICD-10-CM | POA: Diagnosis present

## 2018-12-23 DIAGNOSIS — D631 Anemia in chronic kidney disease: Secondary | ICD-10-CM | POA: Diagnosis present

## 2018-12-23 DIAGNOSIS — Z9989 Dependence on other enabling machines and devices: Secondary | ICD-10-CM

## 2018-12-23 DIAGNOSIS — X58XXXA Exposure to other specified factors, initial encounter: Secondary | ICD-10-CM | POA: Diagnosis present

## 2018-12-23 DIAGNOSIS — Z87891 Personal history of nicotine dependence: Secondary | ICD-10-CM

## 2018-12-23 DIAGNOSIS — Z5181 Encounter for therapeutic drug level monitoring: Secondary | ICD-10-CM

## 2018-12-23 LAB — COMPREHENSIVE METABOLIC PANEL
ALT: 20 U/L (ref 0–44)
AST: 26 U/L (ref 15–41)
Albumin: 2.4 g/dL — ABNORMAL LOW (ref 3.5–5.0)
Alkaline Phosphatase: 290 U/L — ABNORMAL HIGH (ref 38–126)
Anion gap: 16 — ABNORMAL HIGH (ref 5–15)
BUN: 83 mg/dL — ABNORMAL HIGH (ref 8–23)
CO2: 28 mmol/L (ref 22–32)
Calcium: 10.4 mg/dL — ABNORMAL HIGH (ref 8.9–10.3)
Chloride: 99 mmol/L (ref 98–111)
Creatinine, Ser: 8.72 mg/dL — ABNORMAL HIGH (ref 0.61–1.24)
GFR calc Af Amer: 6 mL/min — ABNORMAL LOW (ref >60)
GFR calc non Af Amer: 5 mL/min — ABNORMAL LOW (ref >60)
Glucose, Bld: 145 mg/dL — ABNORMAL HIGH (ref 70–99)
Potassium: 3.9 mmol/L (ref 3.5–5.1)
Sodium: 143 mmol/L (ref 135–145)
Total Bilirubin: 0.9 mg/dL (ref 0.3–1.2)
Total Protein: 7 g/dL (ref 6.5–8.1)

## 2018-12-23 LAB — CBC WITH DIFFERENTIAL/PLATELET
Abs Immature Granulocytes: 0.18 10*3/uL — ABNORMAL HIGH (ref 0.00–0.07)
Basophils Absolute: 0 10*3/uL (ref 0.0–0.1)
Basophils Relative: 0 %
Eosinophils Absolute: 0.1 10*3/uL (ref 0.0–0.5)
Eosinophils Relative: 0 %
HCT: 31.7 % — ABNORMAL LOW (ref 39.0–52.0)
Hemoglobin: 9.5 g/dL — ABNORMAL LOW (ref 13.0–17.0)
Immature Granulocytes: 1 %
Lymphocytes Relative: 5 %
Lymphs Abs: 0.9 10*3/uL (ref 0.7–4.0)
MCH: 30.2 pg (ref 26.0–34.0)
MCHC: 30 g/dL (ref 30.0–36.0)
MCV: 100.6 fL — AB (ref 80.0–100.0)
Monocytes Absolute: 1.2 10*3/uL — ABNORMAL HIGH (ref 0.1–1.0)
Monocytes Relative: 7 %
Neutro Abs: 15.5 10*3/uL — ABNORMAL HIGH (ref 1.7–7.7)
Neutrophils Relative %: 87 %
Platelets: 223 10*3/uL (ref 150–400)
RBC: 3.15 MIL/uL — ABNORMAL LOW (ref 4.22–5.81)
RDW: 17.9 % — ABNORMAL HIGH (ref 11.5–15.5)
WBC: 17.9 10*3/uL — ABNORMAL HIGH (ref 4.0–10.5)
nRBC: 0 % (ref 0.0–0.2)

## 2018-12-23 LAB — PROCALCITONIN: Procalcitonin: 4.96 ng/mL

## 2018-12-23 LAB — I-STAT CG4 LACTIC ACID, ED: Lactic Acid, Venous: 1.82 mmol/L (ref 0.5–1.9)

## 2018-12-23 LAB — MRSA PCR SCREENING: MRSA by PCR: NEGATIVE

## 2018-12-23 LAB — PROTIME-INR
INR: 8.6
Prothrombin Time: 69.4 seconds — ABNORMAL HIGH (ref 11.4–15.2)

## 2018-12-23 MED ORDER — LIDOCAINE HCL (PF) 1 % IJ SOLN: 5.0000 mL | INTRAMUSCULAR | Status: DC | PRN

## 2018-12-23 MED ORDER — ACETAMINOPHEN 500 MG PO TABS
1000.0000 mg | ORAL_TABLET | Freq: Three times a day (TID) | ORAL | Status: DC
  Administered 2018-12-23 – 2019-01-03 (×24): 1000 mg via ORAL
  Filled 2018-12-23 (×27): qty 2

## 2018-12-23 MED ORDER — METHYLPREDNISOLONE SODIUM SUCC 125 MG IJ SOLR
80.0000 mg | Freq: Two times a day (BID) | INTRAMUSCULAR | Status: DC
  Administered 2018-12-24 – 2018-12-25 (×3): 80 mg via INTRAVENOUS
  Filled 2018-12-23 (×3): qty 2

## 2018-12-23 MED ORDER — ZOLPIDEM TARTRATE 5 MG PO TABS: 5.0000 mg | ORAL_TABLET | Freq: Every evening | ORAL | Status: DC | PRN

## 2018-12-23 MED ORDER — HYDROXYZINE HCL 25 MG PO TABS: 25.0000 mg | ORAL_TABLET | Freq: Three times a day (TID) | ORAL | Status: DC | PRN

## 2018-12-23 MED ORDER — NEPRO/CARBSTEADY PO LIQD
237.0000 mL | Freq: Three times a day (TID) | ORAL | Status: DC | PRN
  Filled 2018-12-23: qty 237

## 2018-12-23 MED ORDER — DOXERCALCIFEROL 4 MCG/2ML IV SOLN
2.0000 ug | INTRAVENOUS | Status: DC
  Administered 2018-12-23 – 2018-12-28 (×3): 2 ug via INTRAVENOUS
  Filled 2018-12-23 (×5): qty 2

## 2018-12-23 MED ORDER — ONDANSETRON HCL 4 MG/2ML IJ SOLN
4.0000 mg | Freq: Four times a day (QID) | INTRAMUSCULAR | Status: DC | PRN
  Administered 2018-12-24: 4 mg via INTRAVENOUS
  Filled 2018-12-23: qty 2

## 2018-12-23 MED ORDER — PENTAFLUOROPROP-TETRAFLUOROETH EX AERO: 1.0000 "application " | INHALATION_SPRAY | CUTANEOUS | Status: DC | PRN

## 2018-12-23 MED ORDER — CALCIUM ACETATE (PHOS BINDER) 667 MG PO CAPS
2668.0000 mg | ORAL_CAPSULE | Freq: Three times a day (TID) | ORAL | Status: DC
  Administered 2018-12-23 – 2018-12-25 (×4): 2668 mg via ORAL
  Filled 2018-12-23 (×4): qty 4

## 2018-12-23 MED ORDER — DARBEPOETIN ALFA 150 MCG/0.3ML IJ SOSY
150.0000 ug | PREFILLED_SYRINGE | INTRAMUSCULAR | Status: DC
  Administered 2018-12-30: 150 ug via INTRAVENOUS
  Filled 2018-12-23: qty 0.3

## 2018-12-23 MED ORDER — MIDODRINE HCL 5 MG PO TABS
10.0000 mg | ORAL_TABLET | ORAL | Status: DC
  Administered 2018-12-23 – 2019-01-02 (×4): 10 mg via ORAL
  Filled 2018-12-23 (×4): qty 2

## 2018-12-23 MED ORDER — SODIUM CHLORIDE 0.9 % IV SOLN
2.0000 g | Freq: Once | INTRAVENOUS | Status: AC
  Administered 2018-12-23: 2 g via INTRAVENOUS
  Filled 2018-12-23: qty 2

## 2018-12-23 MED ORDER — ONDANSETRON HCL 4 MG PO TABS: 4.0000 mg | ORAL_TABLET | Freq: Four times a day (QID) | ORAL | Status: DC | PRN

## 2018-12-23 MED ORDER — PANTOPRAZOLE SODIUM 40 MG PO TBEC
40.0000 mg | DELAYED_RELEASE_TABLET | Freq: Two times a day (BID) | ORAL | Status: DC
  Administered 2018-12-23 – 2019-01-03 (×17): 40 mg via ORAL
  Filled 2018-12-23 (×18): qty 1

## 2018-12-23 MED ORDER — VANCOMYCIN HCL IN DEXTROSE 750-5 MG/150ML-% IV SOLN
750.0000 mg | Freq: Once | INTRAVENOUS | Status: AC
  Administered 2018-12-24: 750 mg via INTRAVENOUS
  Filled 2018-12-23: qty 150

## 2018-12-23 MED ORDER — SODIUM CHLORIDE 0.9 % IV SOLN: 100.0000 mL | INTRAVENOUS | Status: DC | PRN

## 2018-12-23 MED ORDER — LEVOFLOXACIN IN D5W 500 MG/100ML IV SOLN: 500.0000 mg | INTRAVENOUS | Status: DC

## 2018-12-23 MED ORDER — VANCOMYCIN HCL 10 G IV SOLR
1500.0000 mg | Freq: Once | INTRAVENOUS | Status: AC
  Administered 2018-12-23: 1500 mg via INTRAVENOUS
  Filled 2018-12-23: qty 1500

## 2018-12-23 MED ORDER — ACETAMINOPHEN 650 MG RE SUPP: 650.0000 mg | Freq: Four times a day (QID) | RECTAL | Status: DC | PRN

## 2018-12-23 MED ORDER — MIDODRINE HCL 5 MG PO TABS
10.0000 mg | ORAL_TABLET | Freq: Once | ORAL | Status: AC
  Administered 2018-12-23: 10 mg via ORAL
  Filled 2018-12-23: qty 2

## 2018-12-23 MED ORDER — HEPARIN SODIUM (PORCINE) 1000 UNIT/ML DIALYSIS: 1000.0000 [IU] | INTRAMUSCULAR | Status: DC | PRN

## 2018-12-23 MED ORDER — CHLORHEXIDINE GLUCONATE CLOTH 2 % EX PADS
6.0000 | MEDICATED_PAD | Freq: Every day | CUTANEOUS | Status: DC
  Administered 2018-12-24 – 2018-12-25 (×2): 6 via TOPICAL

## 2018-12-23 MED ORDER — OXYCODONE HCL 5 MG PO TABS
5.0000 mg | ORAL_TABLET | ORAL | Status: DC | PRN
  Administered 2018-12-26 – 2019-01-02 (×6): 5 mg via ORAL
  Filled 2018-12-23 (×8): qty 1

## 2018-12-23 MED ORDER — LIDOCAINE-PRILOCAINE 2.5-2.5 % EX CREA: 1.0000 "application " | TOPICAL_CREAM | CUTANEOUS | Status: DC | PRN

## 2018-12-23 MED ORDER — VANCOMYCIN HCL IN DEXTROSE 750-5 MG/150ML-% IV SOLN
750.0000 mg | INTRAVENOUS | Status: DC
  Administered 2018-12-26 – 2018-12-28 (×2): 750 mg via INTRAVENOUS
  Filled 2018-12-23 (×3): qty 150

## 2018-12-23 MED ORDER — SORBITOL 70 % SOLN
30.0000 mL | Status: DC | PRN
  Administered 2019-01-02: 30 mL via ORAL
  Filled 2018-12-23: qty 30

## 2018-12-23 MED ORDER — ACETAMINOPHEN 325 MG PO TABS
650.0000 mg | ORAL_TABLET | Freq: Four times a day (QID) | ORAL | Status: DC | PRN
  Administered 2018-12-29: 650 mg via ORAL

## 2018-12-23 MED ORDER — ERYTHROMYCIN 5 MG/GM OP OINT
TOPICAL_OINTMENT | Freq: Three times a day (TID) | OPHTHALMIC | Status: DC
  Administered 2018-12-24 – 2018-12-29 (×12): via OPHTHALMIC
  Filled 2018-12-23: qty 3.5

## 2018-12-23 MED ORDER — FERROUS GLUCONATE 324 (38 FE) MG PO TABS
324.0000 mg | ORAL_TABLET | Freq: Three times a day (TID) | ORAL | Status: DC
  Administered 2018-12-23: 324 mg via ORAL
  Filled 2018-12-23 (×2): qty 1

## 2018-12-23 MED ORDER — DOCUSATE SODIUM 283 MG RE ENEM
1.0000 | ENEMA | RECTAL | Status: DC | PRN
  Filled 2018-12-23: qty 1

## 2018-12-23 MED ORDER — LEVOFLOXACIN IN D5W 750 MG/150ML IV SOLN
750.0000 mg | Freq: Once | INTRAVENOUS | Status: DC
  Administered 2018-12-25: 750 mg via INTRAVENOUS
  Filled 2018-12-23 (×3): qty 150

## 2018-12-23 MED ORDER — CALCIUM CARBONATE ANTACID 1250 MG/5ML PO SUSP
500.0000 mg | Freq: Four times a day (QID) | ORAL | Status: DC | PRN
  Filled 2018-12-23: qty 5

## 2018-12-23 MED ORDER — METOPROLOL TARTRATE 25 MG PO TABS: 25.0000 mg | ORAL_TABLET | Freq: Two times a day (BID) | ORAL | Status: DC

## 2018-12-23 MED ORDER — ATORVASTATIN CALCIUM 10 MG PO TABS
20.0000 mg | ORAL_TABLET | Freq: Every day | ORAL | Status: DC
  Administered 2018-12-23 – 2019-01-02 (×7): 20 mg via ORAL
  Filled 2018-12-23 (×8): qty 2

## 2018-12-23 MED ORDER — CAMPHOR-MENTHOL 0.5-0.5 % EX LOTN
1.0000 "application " | TOPICAL_LOTION | Freq: Three times a day (TID) | CUTANEOUS | Status: DC | PRN
  Filled 2018-12-23: qty 222

## 2018-12-23 MED ORDER — SODIUM CHLORIDE 0.9 % IV SOLN
1.0000 g | INTRAVENOUS | Status: DC
  Administered 2018-12-25 – 2018-12-29 (×5): 1 g via INTRAVENOUS
  Filled 2018-12-23 (×7): qty 1

## 2018-12-23 NOTE — Progress Notes (Signed)
Pharmacy Antibiotic Note  [name] is a 77 y.o. male admitted on 12/23/2018 with pneumonia.  Pharmacy has been consulted for cefepime and vancomycin dosing.  ESRD-HD usually MWF, last HD 12/24.  Plan: Vancomycin 1500mg  IV x 1, then 750mg  IV post HD Cefepime 2g IV x 1, then 1g IV every 24h F/u HD schedule, MRSA PCR and Cx to narrow Vancomycin random level as needed  Height: 5\' 6"  (167.6 cm) Weight: 151 lb 14.4 oz (68.9 kg) IBW/kg (Calculated) : 63.8  Temp (24hrs), Avg:98.9 F (37.2 C), Min:98.8 F (37.1 C), Max:99 F (37.2 C)  Recent Labs  Lab 12/23/18 1128 12/23/18 1148  WBC 17.9*  --   CREATININE 8.72*  --   LATICACIDVEN  --  1.82    Estimated Creatinine Clearance: 6.3 mL/min (A) (by C-G formula based on SCr of 8.72 mg/dL (H)).    Allergies  Allergen Reactions  . Tape Itching and Other (See Comments)    Cloth tape only    Antimicrobials this admission: Vanc 12/27>> Cefepime 12/27>>  Dose adjustments this admission: n/a  Microbiology results: 12/27 BCx: sent  Bertis Ruddy, PharmD Clinical Pharmacist Please check AMION for all [place] numbers 12/23/2018 12:27 PM

## 2018-12-23 NOTE — Progress Notes (Signed)
Pharmacy Antibiotic Note  [name] is a 77 y.o. male admitted on 12/23/2018 with pneumonia.  Pharmacy has been consulted for cefepime and vancomycin dosing.  ESRD-HD usually MWF, last HD 12/24.  Pharmacy also asked to add levaquin.  Patient awaiting hemodialysis tonight.  Plan: Vancomycin 1500mg  IV x 1, then 750mg  IV post HD - will add dose after HD tonight. Cefepime 2g IV x 1, then 1g IV every 24h F/u HD schedule, MRSA PCR and Cx to narrow Vancomycin random level as needed Levaquin 750 mg x 1, then 500 mg q 48 hrs.  Height: 5\' 6"  (167.6 cm) Weight: 151 lb 14.4 oz (68.9 kg) IBW/kg (Calculated) : 63.8  Temp (24hrs), Avg:98.3 F (36.8 C), Min:97.5 F (36.4 C), Max:99 F (37.2 C)  Recent Labs  Lab 12/22/18 12/23/18 1128 12/23/18 1148  WBC  --  17.9*  --   CREATININE 7.4* 8.72*  --   LATICACIDVEN  --   --  1.82    Estimated Creatinine Clearance: 6.3 mL/min (A) (by C-G formula based on SCr of 8.72 mg/dL (H)).    Allergies  Allergen Reactions  . Occlusive Silicone Sheets [Silicone]   . Other     Occlusive adhesive  . Tape Itching and Other (See Comments)    Cloth tape only    Antimicrobials this admission: Vanc 12/27>> Cefepime 12/27>>  Dose adjustments this admission: n/a  Microbiology results: 12/27 BCx: sent  [name], [facility] Clinical Pharmacist Phone [phone]  12/23/2018 8:43 PM

## 2018-12-23 NOTE — Telephone Encounter (Signed)
Myriam Jacobson, Treatment Nurse at [facility] called.  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  Pt has increased WBCs and is lethargic.  I advised her to send patient to the ED. Myriam Jacobson verbalized understanding.  Ashleigh E., LPN.

## 2018-12-23 NOTE — Consult Note (Addendum)
[facility] Renal Consultation Note    Indication for Consultation:  Management of ESRD/hemodialysis; anemia, hypertension/volume and secondary hyperparathyroidism PCP: Dr. Inocencio Homes  HPI: [name] is a 77 y.o. male with ESRD on hemodialysis MWF at [facility]. 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 Scot Dock 12/06/2018). Upon arrival to ED, WBC 17.9, T-99 rectal, BP 115/63, HR 103. CXR shows Asymmetric airspace pulmonary edema versus multifocal bilateral pneumonia, R>L. BC X 2 ordered. He has been started on Vancomycin and cefepime per primary.   Seen in ED. He is completely disoriented, doesn't know his name, in near fetal position. Foul smelling wound noted L 5th toe with yellow wound bed, necrotic area R heel. He follows simple commands but doesn't answer questions. No family members with pt to provide HPI.    Past Medical History:  Diagnosis Date  . A-fib ([place])   . Anemia   . Blood transfusion   . BPH (benign prostatic hyperplasia)   . CHF (congestive heart failure) ([place])   . Diarrhea   . DM (diabetes mellitus) ([place])   . ESRD on hemodialysis ([place])    Started dialysis in 2009  . History of GI bleed    secondary to coumadin  . HTN (hypertension)   . Hyperlipidemia   . OSA (obstructive sleep apnea)    uses CPAP  . Secondary hyperparathyroidism of renal origin [facility])    Past Surgical History:  Procedure Laterality Date  . ABDOMINAL AORTOGRAM N/A 11/15/2018   Procedure: ABDOMINAL AORTOGRAM;  Surgeon: Edward Mitchell, MD;  Location: [place] CV LAB;  Service: Cardiovascular;  Laterality: N/A;  . AMPUTATION Left 12/06/2018   Procedure: AMPUTATION DIGIT LEFT FIFTH TOE;  Surgeon:  Edward Mould, MD;  Location: [place];  Service: Vascular;  Laterality: Left;  . BVT  3/54/65   Left  Basilic Vein Transposition  . CHOLECYSTECTOMY    . EYE SURGERY     Catarct bil  . INSERTION OF DIALYSIS CATHETER  05/28/2012   Procedure: INSERTION OF DIALYSIS CATHETER;  Surgeon: Edward Misty, MD;  Location: [place];  Service: Vascular;  Laterality: Right;  . Left arm shuntogram.    . Left forearm loop graft with 6 mm Gore-Tex graft.    . LOWER EXTREMITY ANGIOGRAPHY Bilateral 11/15/2018   Procedure: LOWER EXTREMITY ANGIOGRAPHY;  Surgeon: Edward Mitchell, MD;  Location: [place] CV LAB;  Service: Cardiovascular;  Laterality: Bilateral;  . Pars plana vitrectomy with 25-gauge system    . PERIPHERAL VASCULAR ATHERECTOMY Left 11/15/2018   Procedure: PERIPHERAL VASCULAR ATHERECTOMY;  Surgeon: Edward Mitchell, MD;  Location: [place] CV LAB;  Service: Cardiovascular;  Laterality: Left;  SFA with STENT  . PERIPHERAL VASCULAR BALLOON ANGIOPLASTY Left 11/15/2018   Procedure: PERIPHERAL VASCULAR BALLOON ANGIOPLASTY;  Surgeon: Edward Mitchell, MD;  Location: [place] CV LAB;  Service: Cardiovascular;  Laterality: Left;  PT   Family History  Problem Relation Age of Onset  . Alzheimer's disease Mother   . Diabetes Father        Amputation:  bilateral legs  . Cancer Daughter        breast cancer  . Diabetes Son   . Heart disease Son  before age 16  . Hypertension Son   . Anesthesia problems Neg Hx    Social History:  reports that he quit smoking about 14 years ago. His smoking use included cigarettes. He has never used smokeless tobacco. He reports that he does not drink alcohol or use drugs. Allergies  Allergen Reactions  . Occlusive Silicone Sheets [Silicone]   . Other     Occlusive adhesive  . Tape Itching and Other (See Comments)    Cloth tape only   Prior to Admission medications   Medication Sig Start Date End Date Taking? Authorizing Provider  acetaminophen  (TYLENOL) 500 MG tablet Take 1,000 mg by mouth 3 (three) times daily.   Yes [provider]  Amino Acids-Protein Hydrolys (FEEDING SUPPLEMENT, PRO-STAT SUGAR FREE 64,) LIQD Take 30 mLs by mouth 2 (two) times daily.   Yes [provider]  calcium acetate (PHOSLO) 667 MG capsule Take 4 capsules (2,668 mg total) by mouth 3 (three) times daily with meals. 12/04/18  Yes Edward Sells, MD  cefTRIAXone (ROCEPHIN) 1 g injection Inject 1 g into the muscle once.   Yes [provider]  doxercalciferol (HECTOROL) 4 MCG/2ML injection Inject 1 mL (2 mcg total) into the vein every Monday, Wednesday, and Friday with hemodialysis. Patient taking differently: Inject 1 mcg into the vein every Monday, Wednesday, and Friday with hemodialysis. 0.5 ml 12/05/18  Yes Edward Sells, MD  ferrous gluconate (FERGON) 240 (27 FE) MG tablet Take 1 tablet (240 mg total) by mouth 3 (three) times daily with meals. 12/04/18  Yes Edward Sells, MD  gentamicin (GARAMYCIN) 0.3 % ophthalmic solution Place 2 drops into both eyes 3 (three) times daily. For conjuctivitis   Yes [provider]  loperamide (IMODIUM A-D) 2 MG tablet Take 4 mg by mouth See admin instructions. Give 4 mg after 1st loose stool as needed for diarrhea   Yes [provider]  Methoxy PEG-Epoetin Beta (MIRCERA) 200 MCG/0.3ML SOSY Inject 200 mcg as directed every Monday, Wednesday, and Friday.   Yes [provider]  metoprolol tartrate (LOPRESSOR) 25 MG tablet Take 25 mg by mouth 2 (two) times daily. Do not give before dialysis Mon, Wed, and Fri   Yes [provider]  midodrine (PROAMATINE) 10 MG tablet Take 10 mg by mouth every Monday, Wednesday, and Friday. 30 minutes prior to dialysis treatment   Yes [provider]  Multiple Vitamin (MULTIVITAMIN WITH MINERALS) TABS tablet Take 1 tablet by mouth daily.   Yes [provider]  oxyCODONE (ROXICODONE) 5 MG immediate release  tablet Take 1 tablet (5 mg total) by mouth every 4 (four) hours as needed. 12/06/18  Yes Edward Mould, MD  pantoprazole (PROTONIX) 40 MG tablet Take 1 tablet (40 mg total) by mouth 2 (two) times daily. 12/04/18  Yes Edward Sells, MD  atorvastatin (LIPITOR) 20 MG tablet Take 1 tablet (20 mg total) by mouth daily at 6 PM. 11/17/18 12/17/18  [name], Edward Picket, MD  warfarin (COUMADIN) 5 MG tablet Take 1 tablet (5 mg total) by mouth daily at 6 PM. Patient taking differently: Take 3 mg by mouth daily at 6 PM.  12/07/18 01/06/19  Edward Sells, MD   Current Facility-Administered Medications  Medication Dose Route Frequency Provider Last Rate Last Dose  . acetaminophen (TYLENOL) tablet 650 mg  650 mg Oral Q6H PRN Karmen Bongo, MD       Or  . acetaminophen (TYLENOL) suppository 650 mg  650 mg Rectal Q6H PRN Karmen Bongo,  MD      . acetaminophen (TYLENOL) tablet 1,000 mg  1,000 mg Oral TID Karmen Bongo, MD      . atorvastatin (LIPITOR) tablet 20 mg  20 mg Oral q1800 Karmen Bongo, MD      . calcium acetate (PHOSLO) capsule 2,668 mg  2,668 mg Oral TID WC Karmen Bongo, MD      . calcium carbonate (dosed in mg elemental calcium) suspension 500 mg of elemental calcium  500 mg of elemental calcium Oral Q6H PRN Karmen Bongo, MD      . camphor-menthol [facility]) lotion 1 application  1 application Topical B0W PRN Karmen Bongo, MD       And  . hydrOXYzine (ATARAX/VISTARIL) tablet 25 mg  25 mg Oral Q8H PRN Karmen Bongo, MD      . Derrill Memo ON 12/24/2018] ceFEPIme (MAXIPIME) 1 g in sodium chloride 0.9 % 100 mL IVPB  1 g Intravenous Q24H Karmen Bongo, MD      . Derrill Memo ON 12/30/2018] Darbepoetin Alfa (ARANESP) injection 150 mcg  150 mcg Intravenous Q Fri-HD Maja Mccaffery, Edward Rinks, MD      . docusate sodium [facility]) enema 283 mg  1 enema Rectal PRN Karmen Bongo, MD      . Derrill Memo ON 12/26/2018] doxercalciferol (HECTOROL) injection 2 mcg  2 mcg Intravenous Q M,W,F-HD Karmen Bongo, MD      . feeding supplement (NEPRO CARB STEADY) liquid 237 mL  237 mL Oral TID PRN Karmen Bongo, MD      . ferrous gluconate [facility]) tablet 240 mg  240 mg Oral TID WC Karmen Bongo, MD      . midodrine (PROAMATINE) tablet 10 mg  10 mg Oral Q M,W,F Karmen Bongo, MD      . ondansetron [facility]) tablet 4 mg  4 mg Oral Q6H PRN Karmen Bongo, MD       Or  . ondansetron [facility]) injection 4 mg  4 mg Intravenous Q6H PRN Karmen Bongo, MD      . oxyCODONE (Oxy IR/ROXICODONE) immediate release tablet 5 mg  5 mg Oral Q4H PRN Karmen Bongo, MD      . pantoprazole (PROTONIX) EC tablet 40 mg  40 mg Oral BID Karmen Bongo, MD      . sorbitol 70 % solution 30 mL  30 mL Oral PRN Karmen Bongo, MD      . Derrill Memo ON 12/26/2018] vancomycin (VANCOCIN) IVPB 750 mg/150 ml premix  750 mg Intravenous Q M,W,F-HD Karmen Bongo, MD      . zolpidem Lorrin Mais) tablet 5 mg  5 mg Oral QHS PRN Karmen Bongo, MD       Current Outpatient Medications  Medication Sig Dispense Refill  . acetaminophen (TYLENOL) 500 MG tablet Take 1,000 mg by mouth 3 (three) times daily.    . Amino Acids-Protein Hydrolys (FEEDING SUPPLEMENT, PRO-STAT SUGAR FREE 64,) LIQD Take 30 mLs by mouth 2 (two) times daily.    . calcium acetate (PHOSLO) 667 MG capsule Take 4 capsules (2,668 mg total) by mouth 3 (three) times daily with meals. 90 capsule 0  . cefTRIAXone (ROCEPHIN) 1 g injection Inject 1 g into the muscle once.    Marland Kitchen doxercalciferol (HECTOROL) 4 MCG/2ML injection Inject 1 mL (2 mcg total) into the vein every Monday, Wednesday, and Friday with hemodialysis. (Patient taking differently: Inject 1 mcg into the vein every Monday, Wednesday, and Friday with hemodialysis. 0.5 ml) 2 mL   . ferrous gluconate (FERGON) 240 (27 FE) MG tablet Take 1 tablet (240  mg total) by mouth 3 (three) times daily with meals. 90 each 0  . gentamicin (GARAMYCIN) 0.3 % ophthalmic solution Place 2 drops into both eyes 3 (three) times daily. For  conjuctivitis    . loperamide (IMODIUM A-D) 2 MG tablet Take 4 mg by mouth See admin instructions. Give 4 mg after 1st loose stool as needed for diarrhea    . Methoxy PEG-Epoetin Beta (MIRCERA) 200 MCG/0.3ML SOSY Inject 200 mcg as directed every Monday, Wednesday, and Friday.    . metoprolol tartrate (LOPRESSOR) 25 MG tablet Take 25 mg by mouth 2 (two) times daily. Do not give before dialysis Mon, Wed, and Fri    . midodrine (PROAMATINE) 10 MG tablet Take 10 mg by mouth every Monday, Wednesday, and Friday. 30 minutes prior to dialysis treatment    . Multiple Vitamin (MULTIVITAMIN WITH MINERALS) TABS tablet Take 1 tablet by mouth daily.    Marland Kitchen oxyCODONE (ROXICODONE) 5 MG immediate release tablet Take 1 tablet (5 mg total) by mouth every 4 (four) hours as needed. 20 tablet 0  . pantoprazole (PROTONIX) 40 MG tablet Take 1 tablet (40 mg total) by mouth 2 (two) times daily. 60 tablet 0  . atorvastatin (LIPITOR) 20 MG tablet Take 1 tablet (20 mg total) by mouth daily at 6 PM. 30 tablet 0  . warfarin (COUMADIN) 5 MG tablet Take 1 tablet (5 mg total) by mouth daily at 6 PM. (Patient taking differently: Take 3 mg by mouth daily at 6 PM. ) 30 tablet 0   Labs: Basic Metabolic Panel: Recent Labs  Lab 12/22/18 12/23/18 1128  NA 144 143  K 3.8 3.9  CL  --  99  CO2  --  28  GLUCOSE  --  145*  BUN 65* 83*  CREATININE 7.4* 8.72*  CALCIUM  --  10.4*   Liver Function Tests: Recent Labs  Lab 12/23/18 1128  AST 26  ALT 20  ALKPHOS 290*  BILITOT 0.9  PROT 7.0  ALBUMIN 2.4*   No results for input(s): LIPASE, AMYLASE in the last 168 hours. No results for input(s): AMMONIA in the last 168 hours. CBC: Recent Labs  Lab 12/23/18 1128  WBC 17.9*  NEUTROABS 15.5*  HGB 9.5*  HCT 31.7*  MCV 100.6*  PLT 223   Cardiac Enzymes: No results for input(s): CKTOTAL, CKMB, CKMBINDEX, TROPONINI in the last 168 hours. CBG: No results for input(s): GLUCAP in the last 168 hours. Iron Studies: No results for  input(s): IRON, TIBC, TRANSFERRIN, FERRITIN in the last 72 hours. Studies/Results: Dg Chest Port 1 View  Result Date: 12/23/2018 CLINICAL DATA:  Lethargy. Leukocytosis. Current history of hypertension, end-stage renal disease, CHF and atrial fibrillation. EXAM: PORTABLE CHEST 1 VIEW COMPARISON:  11/09/2018 and earlier. FINDINGS: Cardiac silhouette moderately enlarged, unchanged. Thoracic aorta atherosclerotic, unchanged. Airspace opacities throughout both lungs, asymmetric and increased in the RIGHT lung, new since the most recent prior examination 11/09/2018. IMPRESSION: Asymmetric airspace pulmonary edema versus multifocal BILATERAL pneumonia, RIGHT greater than LEFT. Electronically Signed   By: Evangeline Dakin M.D.   On: 12/23/2018 12:00    ROS: As per HPI otherwise negative.    Physical Exam: Vitals:   12/23/18 1136 12/23/18 1515 12/23/18 1545 12/23/18 1548  BP:  115/63 139/89   Pulse:      Resp:  16  (!) 23  Temp: 99 F (37.2 C)     TempSrc: Rectal     SpO2:      Weight:  Height:         General: Chronically ill appearing elderly male in NAD, confused Head: Normocephalic, atraumatic, sclera non-icteric, mucus membranes are dry Neck: Supple. JVD hard to evaluate but appears 1/4 up to mandible. PCL Lungs: Bilateral breath sounds with bibasilar crackles, few scattered rhonchi. No WOB.  Heart: Regularly irregular S2,G3 2/6 systolic M. No R/G. Afib on monitor  Abdomen: Soft, non-tender, non-distended with normoactive bowel sounds. No rebound/guarding. No obvious abdominal masses. Liver down 6 cm M-S:  Strength severely reduce Lower extremities: Open wound 5th toe amputation site, malodorous with yellow slough in wound bed. Necrotic area posterior aspect of R heel. Trace to 1+ BLE edema, pre-sacral edema. L foot hot to mid calf, R to above ankle Neuro: Marland Kitchen Moves all extremities spontaneously. Lethargic O x1 Psych:  Responds to questions with slow speech and not  appropriate Dialysis Access:LUA AVF + bruit/thrill  Dialysis Orders: [facility] MWF 4 hrs 180NRe 400/Autoflow 1.5 69.5 kg 2.0 K/ 2.25 Ca UFP2, Linear Sodium LUA AVF -No heparin -Mircera 200 mcg IV q 2 weeks (last dose 12/18/18 ;ast HGB 9.2) -Hectorol 1 mcg IV TIW   Assessment/Plan: 1.  HCAP-per primary. On Vanc/CefepimeNot sure has pneu, looks like mostly volume but bp low limiting removal. Volume overload/Pulmonary edema-appears volume overloaded by CXR and exam. Will have HD tonight-attempt to lower volume as tolerated.  2.  Gangrene L foot-Recent Ray amputation per Dr. Scot Dock 12/06/2018. Per primary Supect this is source of fever and worsened confusion 3.  ESRD -  MWF. HD on schedule. K+ 3.9 Use 3.0 K bath. No heparin  4.  Hypertension/volume  - As noted above. HD today for volume removal as tolerated. Suspect minimal and will require serial HD 5.  Anemia  - HGB 9.5 Recent ESA dose. Follow HGB.  6.  Metabolic bone disease -  Continue VDRA. Unable to get binder info as ecube not accessible at present.  7.  Nutrition - Albumin 2.4 Renal diet, prostat, renal vits 8.  Afib on Coumadin 9.  Dementia 10.  Poor QOL-needs palliative care consult for goals of care. Currently DNR.   Edward H. Owens Shark, NP-C 12/23/2018, 4:04 PM  [employer] [phone] I have seen and examined this patient and agree with the plan of care seen, eval, examined, discussed with PA.  Prognosis grim, and will need intervention for legs if to keep going. QOL very poor.  Needs EOL discussions and Palliative involvement .  [name] 12/23/2018, 5:30 PM

## 2018-12-23 NOTE — ED Notes (Signed)
Called and spoke to Caregiver at [facility], she stated that patient started having generalized weakness yest. Around 2pm , had labs drawn and his WBC came back at 17.1 was started on Rocephin 1 gm IM last pm, last dialysis  Was 12/24

## 2018-12-23 NOTE — Progress Notes (Signed)
Patient refuses CPAP therapy for HS.

## 2018-12-23 NOTE — Progress Notes (Signed)
ANTICOAGULATION CONSULT NOTE - Initial Consult  Pharmacy Consult for Coumadin Indication: atrial fibrillation  Allergies  Allergen Reactions  . Occlusive Silicone Sheets [Silicone]   . Other     Occlusive adhesive  . Tape Itching and Other (See Comments)    Cloth tape only    Patient Measurements: Height: 5\' 6"  (167.6 cm) Weight: 151 lb 14.4 oz (68.9 kg) IBW/kg (Calculated) : 63.8 Heparin Dosing Weight: n/a  Vital Signs: Temp: 97.5 F (36.4 C) (12/27 1643) Temp Source: Oral (12/27 1643) BP: 116/67 (12/27 1643) Pulse Rate: 114 (12/27 1643)  Labs: Recent Labs    12/22/18 12/23/18 1128 12/23/18 1630  HGB  --  9.5*  --   HCT  --  31.7*  --   PLT  --  223  --   LABPROT  --   --  69.4*  INR  --   --  8.60*  CREATININE 7.4* 8.72*  --     Estimated Creatinine Clearance: 6.3 mL/min (A) (by C-G formula based on SCr of 8.72 mg/dL (H)).   Medical History: Past Medical History:  Diagnosis Date  . A-fib ([place])   . Anemia   . Blood transfusion   . BPH (benign prostatic hyperplasia)   . CHF (congestive heart failure) ([place])   . Diarrhea   . DM (diabetes mellitus) ([place])   . ESRD on hemodialysis ([place])    Started dialysis in 2009  . History of GI bleed    secondary to coumadin  . HTN (hypertension)   . Hyperlipidemia   . OSA (obstructive sleep apnea)    uses CPAP  . Secondary hyperparathyroidism of renal origin ([place])     Medications:  Scheduled:  . acetaminophen  1,000 mg Oral TID  . atorvastatin  20 mg Oral q1800  . calcium acetate  2,668 mg Oral TID WC  . [START ON 12/24/2018] Chlorhexidine Gluconate Cloth  6 each Topical Q0600  . [START ON 12/30/2018] darbepoetin (ARANESP) injection - DIALYSIS  150 mcg Intravenous Q Fri-HD  . doxercalciferol  2 mcg Intravenous Q M,W,F-HD  . erythromycin   Left Eye Q8H  . ferrous gluconate  324 mg Oral TID WC  . methylPREDNISolone (SOLU-MEDROL) injection  80 mg Intravenous Q12H  . midodrine  10 mg Oral Q M,W,F-HD  . pantoprazole   40 mg Oral BID    Assessment: 77 yo male admitted for weakness.  On Coumadin PTA for afib, per pt report, PTA Coumadin dose on hold for high INR.  Today's INR 8.6.  No overt bleeding or complications noted.  Goal of Therapy:  INR 2-3 Monitor platelets by anticoagulation protocol: Yes   Plan:  Hold Coumadin tonight Daily INR.  [name], [facility] Clinical Pharmacist Phone [phone]  12/23/2018 6:32 PM

## 2018-12-23 NOTE — Progress Notes (Addendum)
CRITICAL VALUE ALERT  Critical Value: INR 8.60  Date & Time Notied: 12/23/2018 & 1830  Provider Notified: Karmen Bongo, MD  Orders Received/Actions taken:  Awaiting orders

## 2018-12-23 NOTE — ED Triage Notes (Signed)
Patient was sent from [facility] [place]. For c/o generalized weakness, and amp. In toes on the left foot. Patient is alert and will answer questions at times.

## 2018-12-23 NOTE — ED Provider Notes (Addendum)
[facility] EMERGENCY DEPARTMENT Provider Note   CSN: [id number] Arrival date & time: 12/23/18  1039     History   Chief Complaint Chief Complaint  Patient presents with  . Weakness    HPI [name] is a 77 y.o. male with a history of ESRD on HD, diabetes mellitus, GI bleed, A. fib, BPH, CHF, secondary hyperparathyroidism who presents by EMS from [facility] with a chief complaint of generalized weakness.  [facility] staff reports the patient has been endorsing generalized weakness that began yesterday at approximately 1400.  The patient also endorses intermittent subjective fevers over the last few days.  [facility] staff reports blood work was drawn and he had a leukocytosis of 17.  He was given one 1 g of Rocephin yesterday.  He underwent left fifth toe amputation on 12/10 secondary to gangrene.  Per chart review, [facility] staff spoke with vascular surgery earlier today given patient's increased generalized weakness with a elevated white blood cell count and was advised to come to the ER for evaluation.   EMS reports the patient was also noted to be short of breath on arrival. He is on 3L [place]. Patient denies chest pain.   Per staff, dialyzed at [facility] M/W/F. Last dialyzed on 12/14.   The patient will answer questions appropriately, but appears unwell. Level V caveat secondary to acuity of condition.   He is a DNR.   The history is provided by the patient. No language interpreter was used.    Past Medical History:  Diagnosis Date  . A-fib ([place])   . Anemia   . Blood transfusion   . BPH (benign prostatic hyperplasia)   . CHF (congestive heart failure) ([place])   . Diarrhea   . DM (diabetes mellitus) ([place])   . ESRD on hemodialysis ([place])    Started dialysis in 2009  . History of GI bleed    secondary to coumadin  . HTN (hypertension)   . Hyperlipidemia   . OSA (obstructive sleep apnea)    uses CPAP  . Secondary hyperparathyroidism of  renal origin [facility])     Patient Active Problem List   Diagnosis Date Noted  . Conjunctivitis of left eye 01/04/2019  . 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[place]) 12/30/2018  . Open wound of left foot   . Palliative care encounter   . OSA (obstructive sleep apnea)   . HTN (hypertension)   . ESRD on hemodialysis ([place])   . BPH (benign prostatic hyperplasia)   . Hypotension 12/07/2018  . GERD (gastroesophageal reflux disease) 12/07/2018  . Pressure injury of skin 12/03/2018  . Symptomatic anemia 12/02/2018  . Acute on chronic blood loss anemia 12/02/2018  . 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[place]) 12/02/2018  . Lower GI bleeding 12/02/2018  . Heme positive stool   . Acute on chronic diastolic (congestive) heart failure ([place]) 11/19/2018  . Acute CVA (cerebrovascular accident) ([place]) 11/19/2018  . Hypoglycemia 11/19/2018  . Peripheral vascular disease due to secondary diabetes ([place]) 11/19/2018  . Dry gangrene ([place]) 11/19/2018  . 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 DNR (do not resuscitate) discussion   . Goals of care, counseling/discussion   . Palliative care by specialist   . Acute hypoxemic respiratory failure ([place]) 11/06/2018  . Volume overload 11/06/2018  . Multifocal pneumonia 11/06/2018  . Acute metabolic encephalopathy 28/36/6294  . Physical deconditioning 11/06/2018  . Acute respiratory failure with hypoxia and hypercapnia ([place]) 11/04/2018  .  CAP (community acquired pneumonia) 11/04/2018  . Pleural effusion, right 11/04/2018  . Increased ammonia level 11/04/2018  . Abnormal CT of the abdomen 11/04/2018  . Atrial fibrillation with RVR ([place]) 11/04/2018  . Sepsis ([place]) 10/21/2018  . Acute encephalopathy 10/21/2018  . Elevated alkaline phosphatase level 10/21/2018  . Chronic anemia 10/21/2018  . Thrombocytopenia ([place]) 10/21/2018  . Pulmonary HTN ([place]) 03/04/2017  . Aortic valve stenosis 03/04/2017  . Hypertension, accelerated with heart disease,  without CHF 01/12/2017  . Dyspnea on exertion 01/12/2017  . Hyperlipidemia 04/24/2011  . HYPERTENSION, BENIGN 10/24/2009  . DM (diabetes mellitus) ([place]) 10/23/2009  . OBSTRUCTIVE SLEEP APNEA 10/23/2009  . PAF (paroxysmal atrial fibrillation) ([place]) 10/23/2009  . CHF (congestive heart failure) ([place]) 10/23/2009    Past Surgical History:  Procedure Laterality Date  . ABDOMINAL AORTOGRAM N/A 11/15/2018   Procedure: ABDOMINAL AORTOGRAM;  Surgeon: [name], MD;  Location: [place] [facility] CV LAB;  Service: Cardiovascular;  Laterality: N/A;  . AMPUTATION Left 12/06/2018   Procedure: AMPUTATION DIGIT LEFT FIFTH TOE;  Surgeon: Angelia Mould, MD;  Location: [facility];  Service: Vascular;  Laterality: Left;  . AMPUTATION Bilateral 12/29/2018   Procedure: AMPUTATION ABOVE KNEE;  Surgeon: Waynetta Sandy, MD;  Location: [place];  Service: Vascular;  Laterality: Bilateral;  . BVT  6/60/63   Left  Basilic Vein Transposition  . CHOLECYSTECTOMY    . EYE SURGERY     Catarct bil  . INSERTION OF DIALYSIS CATHETER  05/28/2012   Procedure: INSERTION OF DIALYSIS CATHETER;  Surgeon: Mal Misty, MD;  Location: [facility];  Service: Vascular;  Laterality: Right;  . Left arm shuntogram.    . Left forearm loop graft with 6 mm Gore-Tex graft.    . LOWER EXTREMITY ANGIOGRAPHY Bilateral 11/15/2018   Procedure: LOWER EXTREMITY ANGIOGRAPHY;  Surgeon: [name], MD;  Location: [place] [facility] CV LAB;  Service: Cardiovascular;  Laterality: Bilateral;  . Pars plana vitrectomy with 25-gauge system    . PERIPHERAL VASCULAR ATHERECTOMY Left 11/15/2018   Procedure: PERIPHERAL VASCULAR ATHERECTOMY;  Surgeon: [name], MD;  Location: [place] CV LAB;  Service: Cardiovascular;  Laterality: Left;  SFA with STENT  . PERIPHERAL VASCULAR BALLOON ANGIOPLASTY Left 11/15/2018   Procedure: PERIPHERAL VASCULAR BALLOON ANGIOPLASTY;  Surgeon: [name], MD;  Location: [facility] CV LAB;  Service:  Cardiovascular;  Laterality: Left;  PT        Home Medications    Prior to Admission medications   Medication Sig Start Date End Date Taking? Authorizing Provider  acetaminophen (TYLENOL) 500 MG tablet Take 1,000 mg by mouth 3 (three) times daily.   Yes [provider]  Amino Acids-Protein Hydrolys (FEEDING SUPPLEMENT, PRO-STAT SUGAR FREE 64,) LIQD Take 30 mLs by mouth 2 (two) times daily.   Yes [provider]  calcium acetate (PHOSLO) 667 MG capsule Take 4 capsules (2,668 mg total) by mouth 3 (three) times daily with meals. 12/04/18  Yes Nita Sells, MD  doxercalciferol (HECTOROL) 4 MCG/2ML injection Inject 1 mL (2 mcg total) into the vein every Monday, Wednesday, and Friday with hemodialysis. Patient taking differently: Inject 1 mcg into the vein every Monday, Wednesday, and Friday with hemodialysis. 0.5 ml 12/05/18  Yes Nita Sells, MD  ferrous gluconate (FERGON) 240 (27 FE) MG tablet Take 1 tablet (240 mg total) by mouth 3 (three) times daily with meals. 12/04/18  Yes Nita Sells, MD  gentamicin (GARAMYCIN) 0.3 % ophthalmic solution Place 2 drops into both  eyes 3 (three) times daily. For conjuctivitis   Yes [provider]  loperamide (IMODIUM A-D) 2 MG tablet Take 4 mg by mouth See admin instructions. Give 4 mg after 1st loose stool as needed for diarrhea   Yes [provider]  Methoxy PEG-Epoetin Beta (MIRCERA) 200 MCG/0.3ML SOSY Inject 200 mcg as directed every Monday, Wednesday, and Friday.   Yes [provider]  metoprolol tartrate (LOPRESSOR) 25 MG tablet Take 25 mg by mouth 2 (two) times daily. Do not give before dialysis Mon, Wed, and Fri   Yes [provider]  midodrine (PROAMATINE) 10 MG tablet Take 10 mg by mouth every Monday, Wednesday, and Friday. 30 minutes prior to dialysis treatment   Yes [provider]  Multiple Vitamin (MULTIVITAMIN WITH MINERALS) TABS tablet Take 1 tablet by mouth  daily.   Yes [provider]  OVER THE COUNTER MEDICATION at bedtime. BIPAP   Yes [provider]  atorvastatin (LIPITOR) 20 MG tablet Take 1 tablet (20 mg total) by mouth daily at 6 PM. 11/17/18 01/11/19  Arrien, Jimmy Picket, MD  pantoprazole (PROTONIX) 40 MG tablet Take 1 tablet (40 mg total) by mouth daily. 01/01/19   Domenic Polite, MD    Family History Family History  Problem Relation Age of Onset  . Alzheimer's disease Mother   . Diabetes Father        Amputation:  bilateral legs  . Cancer Daughter        breast cancer  . Diabetes Son   . Heart disease Son        before age 35  . Hypertension Son   . Anesthesia problems Neg Hx     Social History Social History   Tobacco Use  . Smoking status: Former Smoker    Types: Cigarettes    Last attempt to quit: 06/30/2004    Years since quitting: 14.5  . Smokeless tobacco: Never Used  Substance Use Topics  . Alcohol use: No  . Drug use: No     Allergies   Occlusive silicone sheets [silicone]; Other; and Tape   Review of Systems Review of Systems  Constitutional: Positive for fever.  Respiratory: Positive for cough and shortness of breath.   Neurological: Positive for weakness (generalized).   Physical Exam Updated Vital Signs BP (!) 99/55 (BP Location: Right Arm)   Pulse 93   Temp (!) 97.5 F (36.4 C) (Axillary)   Resp (!) 29   Ht 5\' 6"  (1.676 m)   Wt 58.3 kg   SpO2 92%   BMI 20.74 kg/m   Physical Exam Vitals signs and nursing note reviewed.  Constitutional:      Appearance: He is well-developed.     Comments: Ill appearing. [place] in place on 3 L.   HENT:     Head: Normocephalic.  Eyes:     Conjunctiva/sclera: Conjunctivae normal.  Neck:     Musculoskeletal: Neck supple.  Cardiovascular:     Rate and Rhythm: Regular rhythm. Tachycardia present.     Heart sounds: No murmur.  Pulmonary:     Effort: Pulmonary effort is normal.  Abdominal:     General: There is no distension.      Palpations: Abdomen is soft. There is no mass.     Tenderness: There is no abdominal tenderness. There is no right CVA tenderness, left CVA tenderness, guarding or rebound.     Hernia: No hernia is present.  Musculoskeletal:     Comments: Malodorous left foot wound.  See picture below.   Skin:    General: Skin is warm and dry.  Neurological:     Mental Status: He is alert.  Psychiatric:        Behavior: Behavior normal.    Left foot         ED Treatments / Results  Labs (all labs ordered are listed, but only abnormal results are displayed) Labs Reviewed  COMPREHENSIVE METABOLIC PANEL - Abnormal; Notable for the following components:      Result Value   Glucose, Bld 145 (*)    BUN 83 (*)    Creatinine, Ser 8.72 (*)    Calcium 10.4 (*)    Albumin 2.4 (*)    Alkaline Phosphatase 290 (*)    GFR calc non Af Amer 5 (*)    GFR calc Af Amer 6 (*)    Anion gap 16 (*)    All other components within normal limits  CBC WITH DIFFERENTIAL/PLATELET - Abnormal; Notable for the following components:   WBC 17.9 (*)    RBC 3.15 (*)    Hemoglobin 9.5 (*)    HCT 31.7 (*)    MCV 100.6 (*)    RDW 17.9 (*)    Neutro Abs 15.5 (*)    Monocytes Absolute 1.2 (*)    Abs Immature Granulocytes 0.18 (*)    All other components within normal limits  PROTIME-INR - Abnormal; Notable for the following components:   Prothrombin Time 69.4 (*)    INR 8.60 (*)    All other components within normal limits  CBC WITH DIFFERENTIAL/PLATELET - Abnormal; Notable for the following components:   WBC 15.6 (*)    RBC 3.37 (*)    Hemoglobin 10.5 (*)    HCT 33.5 (*)    RDW 17.8 (*)    Neutro Abs 13.0 (*)    Monocytes Absolute 1.2 (*)    Abs Immature Granulocytes 0.21 (*)    All other components within normal limits  RENAL FUNCTION PANEL - Abnormal; Notable for the following components:   Glucose, Bld 109 (*)    BUN 48 (*)    Creatinine, Ser 5.30 (*)    Phosphorus 1.8 (*)    Albumin 2.5 (*)    GFR calc  non Af Amer 10 (*)    GFR calc Af Amer 11 (*)    Anion gap 17 (*)    All other components within normal limits  PROTIME-INR - Abnormal; Notable for the following components:   Prothrombin Time 55.8 (*)    INR 6.48 (*)    All other components within normal limits  RENAL FUNCTION PANEL - Abnormal; Notable for the following components:   Glucose, Bld 137 (*)    BUN 25 (*)    Creatinine, Ser 3.19 (*)    Phosphorus 1.7 (*)    Albumin 2.3 (*)    GFR calc non Af Amer 18 (*)    GFR calc Af Amer 20 (*)    All other components within normal limits  CBC - Abnormal; Notable for the following components:   WBC 12.5 (*)    RBC 3.09 (*)    Hemoglobin 9.6 (*)    HCT 31.2 (*)    MCV 101.0 (*)    RDW 17.5 (*)    nRBC 0.3 (*)    All other components within normal limits  PROTIME-INR - Abnormal; Notable for the following components:   Prothrombin Time 38.7 (*)    INR 4.05 (*)  All other components within normal limits  RENAL FUNCTION PANEL - Abnormal; Notable for the following components:   Chloride 95 (*)    Glucose, Bld 182 (*)    BUN 53 (*)    Creatinine, Ser 4.95 (*)    Phosphorus 2.3 (*)    Albumin 2.4 (*)    GFR calc non Af Amer 10 (*)    GFR calc Af Amer 12 (*)    Anion gap 16 (*)    All other components within normal limits  CBC - Abnormal; Notable for the following components:   WBC 12.3 (*)    RBC 3.26 (*)    Hemoglobin 9.7 (*)    HCT 32.3 (*)    RDW 17.3 (*)    All other components within normal limits  PROTIME-INR - Abnormal; Notable for the following components:   Prothrombin Time 38.2 (*)    All other components within normal limits  RENAL FUNCTION PANEL - Abnormal; Notable for the following components:   Glucose, Bld 200 (*)    BUN 27 (*)    Creatinine, Ser 3.13 (*)    Calcium 8.8 (*)    Phosphorus 1.8 (*)    Albumin 2.3 (*)    GFR calc non Af Amer 18 (*)    GFR calc Af Amer 21 (*)    All other components within normal limits  CBC - Abnormal; Notable for the  following components:   WBC 14.1 (*)    RBC 3.22 (*)    Hemoglobin 10.0 (*)    HCT 32.5 (*)    MCV 100.9 (*)    RDW 17.7 (*)    nRBC 0.3 (*)    All other components within normal limits  PROTIME-INR - Abnormal; Notable for the following components:   Prothrombin Time 31.8 (*)    All other components within normal limits  RENAL FUNCTION PANEL - Abnormal; Notable for the following components:   Glucose, Bld 138 (*)    BUN 56 (*)    Creatinine, Ser 5.00 (*)    Phosphorus 2.4 (*)    Albumin 2.4 (*)    GFR calc non Af Amer 10 (*)    GFR calc Af Amer 12 (*)    All other components within normal limits  CBC - Abnormal; Notable for the following components:   WBC 15.6 (*)    RBC 3.31 (*)    Hemoglobin 10.2 (*)    HCT 32.6 (*)    RDW 18.1 (*)    nRBC 0.3 (*)    All other components within normal limits  PROTIME-INR - Abnormal; Notable for the following components:   Prothrombin Time 27.8 (*)    All other components within normal limits  RENAL FUNCTION PANEL - Abnormal; Notable for the following components:   Glucose, Bld 139 (*)    BUN 30 (*)    Creatinine, Ser 3.52 (*)    Calcium 8.6 (*)    Phosphorus 2.3 (*)    Albumin 2.4 (*)    GFR calc non Af Amer 16 (*)    GFR calc Af Amer 18 (*)    All other components within normal limits  CBC - Abnormal; Notable for the following components:   WBC 14.8 (*)    RBC 3.36 (*)    Hemoglobin 10.0 (*)    HCT 32.7 (*)    RDW 18.2 (*)    All other components within normal limits  PROTIME-INR - Abnormal; Notable for the following components:  Prothrombin Time 23.4 (*)    All other components within normal limits  GLUCOSE, CAPILLARY - Abnormal; Notable for the following components:   Glucose-Capillary 108 (*)    All other components within normal limits  RENAL FUNCTION PANEL - Abnormal; Notable for the following components:   Glucose, Bld 173 (*)    BUN 48 (*)    Creatinine, Ser 5.38 (*)    Calcium 8.3 (*)    Phosphorus 4.8 (*)     Albumin 2.2 (*)    GFR calc non Af Amer 9 (*)    GFR calc Af Amer 11 (*)    All other components within normal limits  CBC - Abnormal; Notable for the following components:   WBC 25.7 (*)    RBC 2.66 (*)    Hemoglobin 8.2 (*)    HCT 26.0 (*)    RDW 18.3 (*)    All other components within normal limits  PROTIME-INR - Abnormal; Notable for the following components:   Prothrombin Time 20.3 (*)    All other components within normal limits  GLUCOSE, CAPILLARY - Abnormal; Notable for the following components:   Glucose-Capillary 149 (*)    All other components within normal limits  RENAL FUNCTION PANEL - Abnormal; Notable for the following components:   Glucose, Bld 147 (*)    Creatinine, Ser 3.25 (*)    Calcium 8.2 (*)    Albumin 2.1 (*)    GFR calc non Af Amer 17 (*)    GFR calc Af Amer 20 (*)    All other components within normal limits  CBC - Abnormal; Notable for the following components:   WBC 15.0 (*)    RBC 2.68 (*)    Hemoglobin 8.0 (*)    HCT 26.0 (*)    RDW 18.2 (*)    Platelets 149 (*)    All other components within normal limits  PROTIME-INR - Abnormal; Notable for the following components:   Prothrombin Time 20.5 (*)    All other components within normal limits  PROTIME-INR - Abnormal; Notable for the following components:   Prothrombin Time 19.3 (*)    All other components within normal limits  PROTIME-INR - Abnormal; Notable for the following components:   Prothrombin Time 19.7 (*)    All other components within normal limits  CBC - Abnormal; Notable for the following components:   WBC 13.4 (*)    RBC 2.71 (*)    Hemoglobin 7.9 (*)    HCT 26.7 (*)    MCHC 29.6 (*)    RDW 18.0 (*)    All other components within normal limits  RENAL FUNCTION PANEL - Abnormal; Notable for the following components:   Glucose, Bld 175 (*)    BUN 64 (*)    Creatinine, Ser 8.36 (*)    Calcium 7.8 (*)    Phosphorus 6.2 (*)    Albumin 2.0 (*)    GFR calc non Af Amer 6 (*)     GFR calc Af Amer 6 (*)    Anion gap 17 (*)    All other components within normal limits  PROTIME-INR - Abnormal; Notable for the following components:   Prothrombin Time 19.3 (*)    All other components within normal limits  CULTURE, BLOOD (ROUTINE X 2)  CULTURE, BLOOD (ROUTINE X 2)  MRSA PCR SCREENING  CULTURE, BLOOD (ROUTINE X 2)  CULTURE, BLOOD (ROUTINE X 2)  PROCALCITONIN  I-STAT CG4 LACTIC ACID, ED  SURGICAL PATHOLOGY  EKG None  Radiology No results found.  Procedures .Critical Care Performed by: Joanne Gavel, PA-C Authorized by: Joanne Gavel, PA-C   Critical care provider statement:    Critical care time (minutes):  45   Critical care time was exclusive of:  Separately billable procedures and treating other patients and teaching time   Critical care was necessary to treat or prevent imminent or life-threatening deterioration of the following conditions:  Sepsis   Critical care was time spent personally by me on the following activities:  Discussions with consultants, evaluation of patient's response to treatment, examination of patient, ordering and performing treatments and interventions, ordering and review of laboratory studies, ordering and review of radiographic studies, pulse oximetry, re-evaluation of patient's condition, obtaining history from patient or surrogate and review of old charts   I assumed direction of critical care for this patient from another provider in my specialty: no     (including critical care time)  Medications Ordered in ED Medications  vancomycin (VANCOCIN) 1,500 mg in sodium chloride 0.9 % 500 mL IVPB (1,500 mg Intravenous New Bag/Given 12/23/18 1336)  ceFEPIme (MAXIPIME) 2 g in sodium chloride 0.9 % 100 mL IVPB (0 g Intravenous Stopped 12/23/18 1325)  vancomycin (VANCOCIN) IVPB 750 mg/150 ml premix (750 mg Intravenous Not Given 12/24/18 0336)  midodrine (PROAMATINE) tablet 10 mg (10 mg Oral Given 12/23/18 2113)  vancomycin  (VANCOCIN) IVPB 750 mg/150 ml premix (750 mg Intravenous New Bag/Given 12/25/18 0853)  phytonadione (VITAMIN K) tablet 2.5 mg (2.5 mg Oral Given 12/28/18 1220)  morphine 2 MG/ML injection 2 mg (2 mg Intravenous Given 12/29/18 2126)  LORazepam (ATIVAN) injection 0.5 mg (0.5 mg Intravenous Given 12/29/18 2126)  pantoprazole (PROTONIX) injection 40 mg (40 mg Intravenous Given 12/30/18 2148)  warfarin (COUMADIN) tablet 3 mg (3 mg Oral Given 01/01/19 1734)  warfarin (COUMADIN) tablet 3 mg (3 mg Oral Given 01/02/19 1714)     Initial Impression / Assessment and Plan / ED Course  I have reviewed the triage vital signs and the nursing notes.  Pertinent labs & imaging results that were available during my care of the patient were reviewed by me and considered in my medical decision making (see chart for details).     67--year-old with a history of ESRD on HD, diabetes mellitus, GI bleed, A. fib, BPH, CHF, secondary hyperparathyroidism stenting by EMS from [place] with generalized weakness, onset yesterday. The patient was seen and independently evaluated by Dr. Tomi Bamberger, attending physician.  On arrival, the patient is tachycardic in the 100s.  Rectal temp is 99.  He is mildly tachypneic.  Labs are notable for leukocytosis of 18 with left shift.  Chest x-ray with asymmetric airspace pulmonary edema versus multifocal bilateral pneumonia, right greater than left.  The patient does not appear fluid overloaded, suspect pneumonia.  On exam, he has a foul-smelling wound on his left foot.  He recently underwent amputation of the left fifth digit secondary to gangrene. I do not suspect that this is the primary source of infection.   Although the patient meets sepsis criteria, he is not in septic shock thus Code Sepsis was not initiated.  Initial lactate is normal and since the is on hemodialysis fluids are not indicated at this time.  Cefepime and vancomycin have been given in the ED.   Spoke with [name], the patient's  daughter, to update her on her father's care and pending admission.  All questions answered at this time.  Consult to the  hospitalist team spoke with Dr. Lorin Mercy who will accept the patient for admission. The patient appears reasonably stabilized for admission considering the current resources, flow, and capabilities available in the ED at this time, and I doubt any other [facility] requiring further screening and/or treatment in the ED prior to admission.  Final Clinical Impressions(s) / ED Diagnoses   Final diagnoses:  Multifocal pneumonia  Open wound of left foot, initial encounter  Sepsis with acute organ dysfunction without septic shock, due to unspecified organism, unspecified type [facility])    ED Discharge Orders         Ordered    Increase activity slowly     01/02/19 1129    Diet - low sodium heart healthy     01/02/19 1129    Discharge instructions    Comments:  Fall precautions Renal hemodialysis diet   01/02/19 1129    oxyCODONE (ROXICODONE) 5 MG immediate release tablet  Every 6 hours PRN,   Status:  Discontinued     01/02/19 1501    pantoprazole (PROTONIX) 40 MG tablet  Daily     01/01/19 [place], Laymond Purser, PA-C 12/23/18 1710    Dorie Rank, MD 12/26/18 0831    Sherryle Lis, [place] A, PA-C 01/19/19 2924    Dorie Rank, MD 01/24/19 [phone]

## 2018-12-23 NOTE — H&P (Signed)
History and Physical    [name] FFM:[id number] DOB: 1941-02-11 DOA: 12/23/2018  PCP: Hennie Duos, MD Consultants:  Scot Dock - vascular; Maylon Peppers - hematology; Ellyn Hack - cardiology; Dwyane Dee - endocrinology Patient coming from: [facility] SNF; [facility]:   Chief Complaint: weakness  HPI: [name] is a 77 y.o. male with medical history significant of ESRD on HD; DM; OSA on CPAP; HTN; HLD; CHF; PVD with L 5th ray amputation on 12/10; and afib on Coumadin presenting with weakness.  The patient was too somnolent and ill to provide a history.  HPI per PA McDonald:  [facility] staff reports the patient has been endorsing generalized weakness that began yesterday at approximately 1400. The patient also endorses intermittent subjective fevers over the last few days. [facility] staff reports blood work was drawn and he had a leukocytosis of 17. He was given one 1 g of Rocephin yesterday.  He underwent left fifth toe amputation on 12/10 secondary to gangrene. Per chart review, [facility] staff spoke with vascular surgery earlier today given patient's increased generalized weakness with a elevated white blood cell count and was advised to come to the ER for evaluation.  EMS reports the patient was also noted to be short of breath on arrival. He is on 3L [place]. Patient denies chest pain.  Per staff, dialyzed at [facility] M/W/F. Last dialyzed on 12/14.   ED Course:  Multifocal PNA.  DNR.  Increased weakness, feeling unwell.  WBC 17 yesterday, given Rocephin empirically.  Increased WOB, crackles.  Given Cefepime and Vanc.  ESRD, last HD on 12/24.  Review of Systems: Unable to perform  PMH, PSH, SH, and FH were reviewed in Epic  Past Medical History:  Diagnosis Date  . A-fib ([place])   . Anemia   . Blood transfusion   . BPH (benign prostatic hyperplasia)   . CHF (congestive heart failure) ([place])   . Diarrhea   . DM (diabetes mellitus) ([place])   . ESRD on hemodialysis ([place])    Started dialysis in  2009  . History of GI bleed    secondary to coumadin  . HTN (hypertension)   . Hyperlipidemia   . OSA (obstructive sleep apnea)    uses CPAP  . Secondary hyperparathyroidism of renal origin [facility])     Past Surgical History:  Procedure Laterality Date  . ABDOMINAL AORTOGRAM N/A 11/15/2018   Procedure: ABDOMINAL AORTOGRAM;  Surgeon: [name], MD;  Location: [place] CV LAB;  Service: Cardiovascular;  Laterality: N/A;  . AMPUTATION Left 12/06/2018   Procedure: AMPUTATION DIGIT LEFT FIFTH TOE;  Surgeon: Angelia Mould, MD;  Location: [place];  Service: Vascular;  Laterality: Left;  . BVT  5/70/17   Left  Basilic Vein Transposition  . CHOLECYSTECTOMY    . EYE SURGERY     Catarct bil  . INSERTION OF DIALYSIS CATHETER  05/28/2012   Procedure: INSERTION OF DIALYSIS CATHETER;  Surgeon: Mal Misty, MD;  Location: [place];  Service: Vascular;  Laterality: Right;  . Left arm shuntogram.    . Left forearm loop graft with 6 mm Gore-Tex graft.    . LOWER EXTREMITY ANGIOGRAPHY Bilateral 11/15/2018   Procedure: LOWER EXTREMITY ANGIOGRAPHY;  Surgeon: [name], MD;  Location: [place] CV LAB;  Service: Cardiovascular;  Laterality: Bilateral;  . Pars plana vitrectomy with 25-gauge system    . PERIPHERAL VASCULAR ATHERECTOMY Left 11/15/2018   Procedure: PERIPHERAL VASCULAR ATHERECTOMY;  Surgeon: [name], MD;  Location: [place] CV LAB;  Service: Cardiovascular;  Laterality: Left;  SFA with STENT  . PERIPHERAL VASCULAR BALLOON ANGIOPLASTY Left 11/15/2018   Procedure: PERIPHERAL VASCULAR BALLOON ANGIOPLASTY;  Surgeon: [name], MD;  Location: [place] CV LAB;  Service: Cardiovascular;  Laterality: Left;  PT    Social History   Socioeconomic History  . Marital status: Divorced    Spouse name: Not on file  . Number of children: Not on file  . Years of education: Not on file  . Highest education level: Not on file  Occupational History  . Not on file   Social Needs  . Financial resource strain: Not on file  . Food insecurity:    Worry: Not on file    Inability: Not on file  . Transportation needs:    Medical: Not on file    Non-medical: Not on file  Tobacco Use  . Smoking status: Former Smoker    Types: Cigarettes    Last attempt to quit: 06/30/2004    Years since quitting: 14.4  . Smokeless tobacco: Never Used  Substance and Sexual Activity  . Alcohol use: No  . Drug use: No  . Sexual activity: Never  Lifestyle  . Physical activity:    Days per week: Not on file    Minutes per session: Not on file  . Stress: Not on file  Relationships  . Social connections:    Talks on phone: Not on file    Gets together: Not on file    Attends religious service: Not on file    Active member of club or organization: Not on file    Attends meetings of clubs or organizations: Not on file    Relationship status: Not on file  . Intimate partner violence:    Fear of current or ex partner: Not on file    Emotionally abused: Not on file    Physically abused: Not on file    Forced sexual activity: Not on file  Other Topics Concern  . Not on file  Social History Narrative     The patient is a former smoker, quit is 2005.  Does not     drink or abuse drugs.  Currently, lives alone at home.          Allergies  Allergen Reactions  . Occlusive Silicone Sheets [Silicone]   . Other     Occlusive adhesive  . Tape Itching and Other (See Comments)    Cloth tape only    Family History  Problem Relation Age of Onset  . Alzheimer's disease Mother   . Diabetes Father        Amputation:  bilateral legs  . Cancer Daughter        breast cancer  . Diabetes Son   . Heart disease Son        before age 75  . Hypertension Son   . Anesthesia problems Neg Hx     Prior to Admission medications   Medication Sig Start Date End Date Taking? Authorizing Provider  acetaminophen (TYLENOL) 500 MG tablet Take 1,000 mg by mouth 3 (three) times daily.     [provider]  atorvastatin (LIPITOR) 20 MG tablet Take 1 tablet (20 mg total) by mouth daily at 6 PM. 11/17/18 12/17/18  Arrien, Jimmy Picket, MD  calcium acetate (PHOSLO) 667 MG capsule Take 4 capsules (2,668 mg total) by mouth 3 (three) times daily with meals. 12/04/18   Nita Sells, MD  doxercalciferol (  HECTOROL) 4 MCG/2ML injection Inject 1 mL (2 mcg total) into the vein every Monday, Wednesday, and Friday with hemodialysis. 12/05/18   Nita Sells, MD  ferrous gluconate (FERGON) 240 (27 FE) MG tablet Take 1 tablet (240 mg total) by mouth 3 (three) times daily with meals. 12/04/18   Nita Sells, MD  loperamide (IMODIUM A-D) 2 MG tablet Take 4 mg by mouth See admin instructions. Give 4 mg after 1st loose stool as needed for diarrhea    [provider]  metoprolol tartrate (LOPRESSOR) 25 MG tablet Take 25 mg by mouth 2 (two) times daily. Do not give before dialysis Mon, Wed, and Fri    [provider]  midodrine (PROAMATINE) 10 MG tablet Take 10 mg by mouth every Monday, Wednesday, and Friday. 30 minutes prior to dialysis treatment    [provider]  oxyCODONE (ROXICODONE) 5 MG immediate release tablet Take 1 tablet (5 mg total) by mouth every 4 (four) hours as needed. 12/06/18   Angelia Mould, MD  pantoprazole (PROTONIX) 40 MG tablet Take 1 tablet (40 mg total) by mouth 2 (two) times daily. 12/04/18   Nita Sells, MD  warfarin (COUMADIN) 5 MG tablet Take 1 tablet (5 mg total) by mouth daily at 6 PM. 12/07/18 01/06/19  Nita Sells, MD    Physical Exam: Vitals:   12/23/18 1515 12/23/18 1545 12/23/18 1548 12/23/18 1643  BP: 115/63 139/89  116/67  Pulse:    (!) 114  Resp: 16  (!) 23 (!) 27  Temp:    (!) 97.5 F (36.4 C)  TempSrc:    Oral  SpO2:      Weight:      Height:         General: Ill-appearing, obtunded.  Opens eyes to voice/stimulation but does not attempt to answer questions or otherwise  interact. Eyes:  PERRL, EOMI, normal lids; left conjunctival injection with purulent drainage ENT:  mmm Neck:  no LAD, masses or thyromegaly Cardiovascular:   RRR with tachycardia, no m/r/g. No LE edema.  Respiratory:   Diminished breath sounds LLL.  Normal respiratory effort. Abdomen:  soft, NT, ND, NABS Back:   normal alignment, no CVAT Skin: deep ulceration along the L lateral foot where the recent 5th ray amputation took place; no obvious purulence       Musculoskeletal:  no bony abnormality Psychiatric: obtunded, opens eyes to voice/touch and then returns to sleep Neurologic: unable to perform    Radiological Exams on Admission: Dg Chest Port 1 View  Result Date: 12/23/2018 CLINICAL DATA:  Lethargy. Leukocytosis. Current history of hypertension, end-stage renal disease, CHF and atrial fibrillation. EXAM: PORTABLE CHEST 1 VIEW COMPARISON:  11/09/2018 and earlier. FINDINGS: Cardiac silhouette moderately enlarged, unchanged. Thoracic aorta atherosclerotic, unchanged. Airspace opacities throughout both lungs, asymmetric and increased in the RIGHT lung, new since the most recent prior examination 11/09/2018. IMPRESSION: Asymmetric airspace pulmonary edema versus multifocal BILATERAL pneumonia, RIGHT greater than LEFT. Electronically Signed   By: Evangeline Dakin M.D.   On: 12/23/2018 12:00    EKG: not done   Labs on Admission: I have personally reviewed the available labs and imaging studies at the time of the admission.  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[place])   OSA (obstructive sleep apnea)   HTN (hypertension)   ESRD on hemodialysis ([place])   Acute  encephalopathy -Patient presenting with encephalopathy as evidenced by his obtunded/unresponsiveness -Concern is for infection with HCAP and so he was given broad-spectrum antibiotics empirically -He also had a recent amputation and continues to show evidence of poor circulation and ineffective healing -Finally, his last HD was apparently on 12/14 and so uremia is almost certainly a contributing factor -Will admit to SDU for ongoing evaluation and treatment  Multifocal PNA with possible sepsis -SIRS criteria in this patient includes: Leukocytosis, tachycardia, tachypnea  -Patient has evidence of acute organ failure with AMS -While awaiting blood cultures, this appears to be a preseptic condition. -Sepsis protocol initiated -Suspected source is PNA based on CXR with multifocal PNA -CURB-65 score is 3+, meaning that the patient has a 14.5% risk of death. -Pneumonia Severity Index (PSI) is Class 5, 27% mortality. -Corticosteroids have been to shown to low overall mortality rate; risk of ARDS; and need for mechanical ventilation.  This is particularly true in severe PNA (class 3+ PSI).  Will add solumedrol. -The patient has the following criteria for MDR (multi-drug resistance): IV antibiotics in the last 90 days;  >2 days of inpatient treatment within the last 90 days; SNF placement; HD patients. -The patient will need treatment for HCAP due to MDR risk factors as above; will treat with Cefepime and Vancomycin. -The patient has 3+ MDR risk factors and so should receive 3-drug treatment; will add Levaquin. -Fever control -Repeat CBC in am -Sputum cultures -Blood cultures -Strep pneumo testing -Will order lower respiratory tract procalcitonin level.  Antibiotics would not be indicated for PCT <0.1 and probably should not be used for < 0.25.  >0.5 indicates infection and >>0.5 indicates more serious disease.  As the procalcitonin level normalizes, it will be reasonable to consider de-escalation of  antibiotic coverage.  ESRD with uremia -Patient on chronic MWF HD -Nephrology prn order set utilized -He does appear to need acute HD -Nephrology has seen the patient -Given that his last reported HD was 12/14, it is possible that his AMS is reflective of uremia and so may improve with HD  Gangrene of foot -Patient has had chronic, nonhealing wounds on his left foot -He had atherectomy with a DES and angioplasty on 11/19 -He had a 5th ray amputation on the foot on 12/10 -The area clearly does not appear to be healing, but also does not show purulence or surrounding erythema suggestive of acute infection -Will monitor without intervention for now -Consider vascular surgery consult if ongoing concerns that this is the source of his illness -Antibiotics ordered should provide sufficient coverage  Conjuctivitis -Treated on 12/24 with garamycin drops -Appears to only be present on the left at this time -Will order erythromycin ophthalmic ointment for now  Afib -Rate controlled with Lopressor, which is being held at this time -Coumadin dosing per pharmacy  HTN -Midodrine with HD -Holding Lopressor  OSA -Continue CPAP  Goals of care -Patient is currently DNR -He may need palliative/hospice consultation depending on clinical course    DVT prophylaxis:  Coumadin Code Status:  DNR - paperwork at bedside Family Communication: None present Disposition Plan:  Back to SNF once clinically improved Consults called: Nephrology Admission status: Admit - It is my clinical opinion that admission to [place] is reasonable and necessary because of the expectation that this patient will require hospital care that crosses at least  2 midnights to treat this condition based on the medical complexity of the problems presented.  Given the aforementioned information, the predictability of an adverse outcome is felt to be significant.   Total critical care time: 70 minutes Critical care time was  exclusive of separately billable procedures and treating other patients. Critical care was necessary to treat or prevent imminent or life-threatening deterioration. Critical care was time spent personally by me on the following activities: development of treatment plan with patient and/or surrogate as well as nursing, discussions with consultants, evaluation of patient's response to treatment, examination of patient, obtaining history from patient or surrogate, ordering and performing treatments and interventions, ordering and review of laboratory studies, ordering and review of radiographic studies, pulse oximetry and re-evaluation of patient's condition.    Karmen Bongo MD [facility]  If note is complete, please contact covering daytime or nighttime physician. [email or web address] Password [facility]  12/23/2018, 5:35 PM

## 2018-12-24 DIAGNOSIS — I480 Paroxysmal atrial fibrillation: Secondary | ICD-10-CM

## 2018-12-24 DIAGNOSIS — R6520 Severe sepsis without septic shock: Secondary | ICD-10-CM

## 2018-12-24 DIAGNOSIS — S91302A Unspecified open wound, left foot, initial encounter: Secondary | ICD-10-CM

## 2018-12-24 DIAGNOSIS — G4733 Obstructive sleep apnea (adult) (pediatric): Secondary | ICD-10-CM

## 2018-12-24 LAB — RENAL FUNCTION PANEL
Albumin: 2.5 g/dL — ABNORMAL LOW (ref 3.5–5.0)
Anion gap: 17 — ABNORMAL HIGH (ref 5–15)
BUN: 48 mg/dL — ABNORMAL HIGH (ref 8–23)
CO2: 27 mmol/L (ref 22–32)
Calcium: 9.8 mg/dL (ref 8.9–10.3)
Chloride: 99 mmol/L (ref 98–111)
Creatinine, Ser: 5.3 mg/dL — ABNORMAL HIGH (ref 0.61–1.24)
GFR calc Af Amer: 11 mL/min — ABNORMAL LOW (ref >60)
GFR, EST NON AFRICAN AMERICAN: 10 mL/min — AB (ref >60)
Glucose, Bld: 109 mg/dL — ABNORMAL HIGH (ref 70–99)
Phosphorus: 1.8 mg/dL — ABNORMAL LOW (ref 2.5–4.6)
Potassium: 3.8 mmol/L (ref 3.5–5.1)
Sodium: 143 mmol/L (ref 135–145)

## 2018-12-24 LAB — CBC WITH DIFFERENTIAL/PLATELET
Abs Immature Granulocytes: 0.21 10*3/uL — ABNORMAL HIGH (ref 0.00–0.07)
Basophils Absolute: 0.1 10*3/uL (ref 0.0–0.1)
Basophils Relative: 0 %
Eosinophils Absolute: 0.2 10*3/uL (ref 0.0–0.5)
Eosinophils Relative: 1 %
HEMATOCRIT: 33.5 % — AB (ref 39.0–52.0)
Hemoglobin: 10.5 g/dL — ABNORMAL LOW (ref 13.0–17.0)
Immature Granulocytes: 1 %
Lymphocytes Relative: 6 %
Lymphs Abs: 0.9 10*3/uL (ref 0.7–4.0)
MCH: 31.2 pg (ref 26.0–34.0)
MCHC: 31.3 g/dL (ref 30.0–36.0)
MCV: 99.4 fL (ref 80.0–100.0)
Monocytes Absolute: 1.2 10*3/uL — ABNORMAL HIGH (ref 0.1–1.0)
Monocytes Relative: 8 %
Neutro Abs: 13 10*3/uL — ABNORMAL HIGH (ref 1.7–7.7)
Neutrophils Relative %: 84 %
Platelets: 204 10*3/uL (ref 150–400)
RBC: 3.37 MIL/uL — ABNORMAL LOW (ref 4.22–5.81)
RDW: 17.8 % — ABNORMAL HIGH (ref 11.5–15.5)
WBC: 15.6 10*3/uL — ABNORMAL HIGH (ref 4.0–10.5)
nRBC: 0.1 % (ref 0.0–0.2)

## 2018-12-24 LAB — PROTIME-INR
INR: 6.48
Prothrombin Time: 55.8 seconds — ABNORMAL HIGH (ref 11.4–15.2)

## 2018-12-24 MED ORDER — CHLORHEXIDINE GLUCONATE 0.12 % MT SOLN
15.0000 mL | Freq: Two times a day (BID) | OROMUCOSAL | Status: DC
  Administered 2018-12-24 – 2019-01-03 (×14): 15 mL via OROMUCOSAL
  Filled 2018-12-24 (×14): qty 15

## 2018-12-24 MED ORDER — METOPROLOL TARTRATE 25 MG PO TABS
25.0000 mg | ORAL_TABLET | Freq: Two times a day (BID) | ORAL | Status: DC
  Administered 2018-12-24 – 2019-01-03 (×16): 25 mg via ORAL
  Filled 2018-12-24 (×10): qty 1
  Filled 2018-12-24: qty 2
  Filled 2018-12-24 (×6): qty 1

## 2018-12-24 MED ORDER — VANCOMYCIN HCL IN DEXTROSE 750-5 MG/150ML-% IV SOLN
INTRAVENOUS | Status: AC
  Administered 2018-12-24: 750 mg via INTRAVENOUS
  Filled 2018-12-24: qty 150

## 2018-12-24 MED ORDER — CHLORHEXIDINE GLUCONATE CLOTH 2 % EX PADS
6.0000 | MEDICATED_PAD | Freq: Every day | CUTANEOUS | Status: DC
  Administered 2018-12-24 – 2018-12-29 (×4): 6 via TOPICAL

## 2018-12-24 MED ORDER — ORAL CARE MOUTH RINSE
15.0000 mL | Freq: Two times a day (BID) | OROMUCOSAL | Status: DC
  Administered 2018-12-26 – 2019-01-01 (×4): 15 mL via OROMUCOSAL

## 2018-12-24 MED ORDER — VITAMIN K1 10 MG/ML IJ SOLN
2.0000 mg | Freq: Once | INTRAMUSCULAR | Status: DC
  Filled 2018-12-24: qty 0.2

## 2018-12-24 MED ORDER — RENA-VITE PO TABS
1.0000 | ORAL_TABLET | Freq: Every day | ORAL | Status: DC
  Administered 2018-12-24 – 2019-01-02 (×9): 1 via ORAL
  Filled 2018-12-24 (×9): qty 1

## 2018-12-24 NOTE — Progress Notes (Signed)
Subjective: Interval History: more alert but still confused,more coop, bp improving.  Objective: Vital signs in last 24 hours: Temp:  [97.5 F (36.4 C)-99 F (37.2 C)] 97.7 F (36.5 C) (12/28 0335) Pulse Rate:  [70-116] 106 (12/28 0209) Resp:  [16-34] 24 (12/28 0209) BP: (108-139)/(58-89) 119/74 (12/28 0335) SpO2:  [95 %-100 %] 100 % (12/28 0209) Weight:  [68 kg-69.5 kg] 68 kg (12/28 0209) Weight change:   Intake/Output from previous day: 12/27 0701 - 12/28 0700 In: 240 [P.O.:240] Out: 1501  Intake/Output this shift: No intake/output data recorded.  General appearance: cooperative, no distress and confused, but more alert, more comfortable Resp: rales bibasilar Cardio: S1, S2 normal and systolic murmur: systolic ejection 2/6, crescendo and decrescendo at 2nd left intercostal space GI: pos bs, soft, liver down 5 cm Extremities: aVF LUA .  ulcer 1 cm deep on L , R heel ischemic. legs hot to mid calf  Lab Results: Recent Labs    12/23/18 1128 12/24/18 0227  WBC 17.9* 15.6*  HGB 9.5* 10.5*  HCT 31.7* 33.5*  PLT 223 204   BMET:  Recent Labs    12/23/18 1128 12/24/18 0227  NA 143 143  K 3.9 3.8  CL 99 99  CO2 28 27  GLUCOSE 145* 109*  BUN 83* 48*  CREATININE 8.72* 5.30*  CALCIUM 10.4* 9.8   No results for input(s): PTH in the last 72 hours. Iron Studies: No results for input(s): IRON, TIBC, TRANSFERRIN, FERRITIN in the last 72 hours.  Studies/Results: Dg Chest Port 1 View  Result Date: 12/23/2018 CLINICAL DATA:  Lethargy. Leukocytosis. Current history of hypertension, end-stage renal disease, CHF and atrial fibrillation. EXAM: PORTABLE CHEST 1 VIEW COMPARISON:  11/09/2018 and earlier. FINDINGS: Cardiac silhouette moderately enlarged, unchanged. Thoracic aorta atherosclerotic, unchanged. Airspace opacities throughout both lungs, asymmetric and increased in the RIGHT lung, new since the most recent prior examination 11/09/2018. IMPRESSION: Asymmetric airspace  pulmonary edema versus multifocal BILATERAL pneumonia, RIGHT greater than LEFT. Electronically Signed   By: Evangeline Dakin M.D.   On: 12/23/2018 12:00    I have reviewed the patient's current medications.  Assessment/Plan: 1 ESRD better, still vol xs. 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

## 2018-12-24 NOTE — Procedures (Signed)
I was present at this session.  I have reviewed the session itself and made appropriate changes.  BP 100-130.  Access press ok , trying for 3 L as has xs vol.  tol well.  Jeneen Rinks Ka Flammer 12/28/20193:14 PM

## 2018-12-24 NOTE — Progress Notes (Signed)
Replaced dressing on left foot per Dr. Wynelle Cleveland put pink foam dressing and wrapped.

## 2018-12-24 NOTE — Progress Notes (Addendum)
PROGRESS NOTE    [name]   ZOX:[id number]  DOB: [birth year]  DOA: 12/23/2018 PCP: Hennie Duos, MD   Brief Narrative:    [name] is a 77 y.o. male with medical history significant for dementia, ESRD on HD; DM; OSA on CPAP; HTN; HLD; CHF; PVD with L 5th ray amputation on 12/10; and afib on Coumadin presenting with weakness. He has dementia and is unable to give history.  Sent from [facility] for subjective fevers, weakness. He underwent left fifth toe amputation on 12/10 secondary to gangrene. The staff spoke with Vascular surgery and was recommended to send patient to ED. He had received a dose of IM Ceftraixone the day before.  EMS reports the patient was also noted to be short of breath on arrival. He was on 3L [place]. In ED, diagnosed with multifocal pneumonia and started on Vancomycin, Levaquin and Cefepime. In ED > ED, WBC 17.9, T-99 rectal, BP 115/63, HR 103. CXR shows Asymmetric airspace pulmonary edema versus multifocal bilateral pneumonia, R>L. BC X 2 ordered.  Was dialyzed overnight as he was volume overloaded per nephro.  Subjective: Not able to participate in history. Has no complaints.     Assessment & Plan:   Principal Problem:   Subjective fevers, leukocytosis, acute respiratory failure, tachycardia - ? Pneumonia vs infected post op foot wound (foul smelling with discharge) -  Cont Vanc/ Cefepime- wound care consulted- notified vascular surgery that he is in the hospital - f/u blood  Cultures - repeat CXR once adequately diuresed- going for dialysis again this AM  Active Problems:  Diabetic wet gangrene of the foot  - see above details  ESRD on hemodialysis / fluid overloaded - d/w Dr Deterding who feels he is fluid overloaded- back to back dialysis since admission - second one will be today - on Midodrine as outpt on dialysis    PAF (paroxysmal atrial fibrillation)/ coagulopathy - INR ~8 on admission- I have given a dose of Vit K- no signs of  bleeding - follow INR- hold coumadin - Lopressor on hold- will reorder with holding parameters     Dementia with possible acute encephalopathy  - baseline unknown- follow    OBSTRUCTIVE SLEEP APNEA - CPAP ordered  Conjuctivitis -Treated on 12/24 with garamycin drops -Appears to only be present on the left at this time -Will order erythromycin ophthalmic ointment for now  GOC> ed- Dr Deterding knows the patient very well and has recommended palliative care whom I have consulted  DVT prophylaxis: INR elevated Code Status: DNR Family Communication: Consultants:   Nephrology Procedures:   none Antimicrobials:  Anti-infectives (From admission, onward)   Start     Dose/Rate Route Frequency Ordered Stop   12/26/18 1200  vancomycin (VANCOCIN) IVPB 750 mg/150 ml premix     750 mg 150 mL/hr over 60 Minutes Intravenous Every M-W-F (Hemodialysis) 12/23/18 1231     12/25/18 1800  levofloxacin (LEVAQUIN) IVPB 500 mg     500 mg 100 mL/hr over 60 Minutes Intravenous Every 48 hours 12/23/18 1805     12/24/18 1300  ceFEPIme (MAXIPIME) 1 g in sodium chloride 0.9 % 100 mL IVPB     1 g 200 mL/hr over 30 Minutes Intravenous Every 24 hours 12/23/18 1230     12/24/18 0200  vancomycin (VANCOCIN) IVPB 750 mg/150 ml premix     750 mg 150 mL/hr over 60 Minutes Intravenous  Once 12/23/18 2049 12/24/18 0150   12/23/18 1815  levofloxacin (LEVAQUIN) IVPB  750 mg     750 mg 100 mL/hr over 90 Minutes Intravenous  Once 12/23/18 1805     12/23/18 1245  vancomycin (VANCOCIN) 1,500 mg in sodium chloride 0.9 % 500 mL IVPB     1,500 mg 250 mL/hr over 120 Minutes Intravenous  Once 12/23/18 1230 12/23/18 1536   12/23/18 1245  ceFEPIme (MAXIPIME) 2 g in sodium chloride 0.9 % 100 mL IVPB     2 g 200 mL/hr over 30 Minutes Intravenous  Once 12/23/18 1230 12/23/18 1325       Objective: Vitals:   12/24/18 0209 12/24/18 0335 12/24/18 0749 12/24/18 0825  BP: 108/62 119/74 130/87   Pulse: (!) 106  (!) 112 (!)  104  Resp: (!) 24  (!) 23 (!) 35  Temp: 97.7 F (36.5 C) 97.7 F (36.5 C) 98.6 F (37 C)   TempSrc: Oral  Oral   SpO2: 100%  100% 100%  Weight: 68 kg     Height:        Intake/Output Summary (Last 24 hours) at 12/24/2018 1300 Last data filed at 12/24/2018 0825 Gross per 24 hour  Intake 340 ml  Output 1501 ml  Net -1161 ml   Filed Weights   12/23/18 1057 12/23/18 2143 12/24/18 0209  Weight: 68.9 kg 69.5 kg 68 kg    Examination: General exam: Appears comfortable  HEENT: PERRL, mouth dry Respiratory system: rhonci, pulse ox 88% on room air Cardiovascular system: S1 & S2 heard, IIRR.  tachycardic Gastrointestinal system: Abdomen soft, non-tender, nondistended. Normal bowel sounds. Central nervous system: Alert and disoriented x 2  No focal neurological deficits. Skin: foul smelling wound left foot   Psychiatry:  Mood  appropriate.       Data Reviewed: I have personally reviewed following labs and imaging studies  CBC: Recent Labs  Lab 12/23/18 1128 12/24/18 0227  WBC 17.9* 15.6*  NEUTROABS 15.5* 13.0*  HGB 9.5* 10.5*  HCT 31.7* 33.5*  MCV 100.6* 99.4  PLT 223 951   Basic Metabolic Panel: Recent Labs  Lab 12/22/18 12/23/18 1128 12/24/18 0227  NA 144 143 143  K 3.8 3.9 3.8  CL  --  99 99  CO2  --  28 27  GLUCOSE  --  145* 109*  BUN 65* 83* 48*  CREATININE 7.4* 8.72* 5.30*  CALCIUM  --  10.4* 9.8  PHOS  --   --  1.8*   GFR: Estimated Creatinine Clearance: 10.4 mL/min (A) (by C-G formula based on SCr of 5.3 mg/dL (H)). Liver Function Tests: Recent Labs  Lab 12/23/18 1128 12/24/18 0227  AST 26  --   ALT 20  --   ALKPHOS 290*  --   BILITOT 0.9  --   PROT 7.0  --   ALBUMIN 2.4* 2.5*   No results for input(s): LIPASE, AMYLASE in the last 168 hours. No results for input(s): AMMONIA in the last 168 hours. Coagulation Profile: Recent Labs  Lab 12/23/18 1630 12/24/18 0925  INR 8.60* 6.48*   Cardiac Enzymes: No results for input(s): CKTOTAL,  CKMB, CKMBINDEX, TROPONINI in the last 168 hours. BNP (last 3 results) No results for input(s): PROBNP in the last 8760 hours. HbA1C: No results for input(s): HGBA1C in the last 72 hours. CBG: No results for input(s): GLUCAP in the last 168 hours. Lipid Profile: No results for input(s): CHOL, HDL, LDLCALC, TRIG, CHOLHDL, LDLDIRECT in the last 72 hours. Thyroid Function Tests: No results for input(s): TSH, T4TOTAL, FREET4, T3FREE, THYROIDAB in the  last 72 hours. Anemia Panel: No results for input(s): VITAMINB12, FOLATE, FERRITIN, TIBC, IRON, RETICCTPCT in the last 72 hours. Urine analysis:    Component Value Date/Time   COLORURINE YELLOW 10/20/2008 [place] 10/20/2008 0942   LABSPEC 1.011 10/20/2008 0942   PHURINE 6.0 10/20/2008 0942   GLUCOSEU NEGATIVE 10/20/2008 0942   HGBUR LARGE (A) 10/20/2008 0942   BILIRUBINUR NEGATIVE 10/20/2008 0942   KETONESUR NEGATIVE 10/20/2008 0942   PROTEINUR 100 (A) 10/20/2008 0942   UROBILINOGEN 1.0 10/20/2008 0942   NITRITE NEGATIVE 10/20/2008 0942   LEUKOCYTESUR SMALL (A) 10/20/2008 0942   Sepsis Labs: @LABRCNTIP (procalcitonin:4,lacticidven:4) ) Recent Results (from the past 240 hour(s))  Blood Culture (routine x 2)     Status: None (Preliminary result)   Collection Time: 12/23/18 11:38 AM  Result Value Ref Range Status   Specimen Description BLOOD RIGHT ANTECUBITAL  Final   Special Requests   Final    BOTTLES DRAWN AEROBIC AND ANAEROBIC Blood Culture adequate volume   Culture   Final    NO GROWTH 1 DAY Performed at [facility], [place] [address], [place], [place] [address]    Report Status PENDING  Incomplete  Blood Culture (routine x 2)     Status: None (Preliminary result)   Collection Time: 12/23/18 12:45 PM  Result Value Ref Range Status   Specimen Description BLOOD RIGHT ANTECUBITAL  Final   Special Requests AEROBIC BOTTLE ONLY Blood Culture adequate volume  Final   Culture   Final    NO GROWTH < 24  HOURS Performed at [facility], [place] [address] [place], [place] [address]    Report Status PENDING  Incomplete  MRSA PCR Screening     Status: None   Collection Time: 12/23/18  5:08 PM  Result Value Ref Range Status   MRSA by PCR NEGATIVE NEGATIVE Final    Comment:        The GeneXpert MRSA Assay (FDA approved for NASAL specimens only), is one component of a comprehensive MRSA colonization surveillance program. It is not intended to diagnose MRSA infection nor to guide or monitor treatment for MRSA infections. Performed at [facility], [place] [address], [place], [place] [address]          Radiology Studies: Dg Chest Port 1 View  Result Date: 12/23/2018 CLINICAL DATA:  Lethargy. Leukocytosis. Current history of hypertension, end-stage renal disease, CHF and atrial fibrillation. EXAM: PORTABLE CHEST 1 VIEW COMPARISON:  11/09/2018 and earlier. FINDINGS: Cardiac silhouette moderately enlarged, unchanged. Thoracic aorta atherosclerotic, unchanged. Airspace opacities throughout both lungs, asymmetric and increased in the RIGHT lung, new since the most recent prior examination 11/09/2018. IMPRESSION: Asymmetric airspace pulmonary edema versus multifocal BILATERAL pneumonia, RIGHT greater than LEFT. Electronically Signed   By: Evangeline Dakin M.D.   On: 12/23/2018 12:00      Scheduled Meds: . acetaminophen  1,000 mg Oral TID  . atorvastatin  20 mg Oral q1800  . calcium acetate  2,668 mg Oral TID WC  . chlorhexidine  15 mL Mouth Rinse BID  . Chlorhexidine Gluconate Cloth  6 each Topical Q0600  . Chlorhexidine Gluconate Cloth  6 each Topical Q0600  . [START ON 12/30/2018] darbepoetin (ARANESP) injection - DIALYSIS  150 mcg Intravenous Q Fri-HD  . doxercalciferol  2 mcg Intravenous Q M,W,F-HD  . erythromycin   Left Eye Q8H  . mouth rinse  15 mL Mouth Rinse q12n4p  . methylPREDNISolone (SOLU-MEDROL) injection  80 mg Intravenous Q12H  .  midodrine  10 mg Oral Q M,W,F-HD   . multivitamin  1 tablet Oral QHS  . pantoprazole  40 mg Oral BID  . phytonadione  2 mg Subcutaneous Once   Continuous Infusions: . sodium chloride    . sodium chloride    . ceFEPime (MAXIPIME) IV    . [START ON 12/25/2018] levofloxacin (LEVAQUIN) IV    . levofloxacin (LEVAQUIN) IV    . [START ON 12/26/2018] vancomycin       LOS: 1 day    Time spent in minutes: 69 min    Debbe Odea, MD [facility] Pager: [email or web address] Password [email or web address] 12/24/2018, 1:00 PM

## 2018-12-24 NOTE — Progress Notes (Signed)
HD tx completed @ 0150 w/o problem UF goal met Blood rinsed back VSS Report called to Toni Arthurs, RN

## 2018-12-24 NOTE — Progress Notes (Signed)
Patient stated "No" when asked if he wanted to wear CPAP at this time, no distress noted RCP will continue to follow.

## 2018-12-24 NOTE — Progress Notes (Signed)
HD tx initiated via 15Gx2 w/o problem Pull/push/flush well w/o problem VSS Will continue to monitor while on HD tx 

## 2018-12-24 NOTE — Progress Notes (Addendum)
ANTICOAGULATION CONSULT NOTE - Follow Up Consult  Pharmacy Consult for Coumadin Indication: atrial fibrillation  Allergies  Allergen Reactions  . Occlusive Silicone Sheets [Silicone]   . Other     Occlusive adhesive  . Tape Itching and Other (See Comments)    Cloth tape only    Patient Measurements: Height: 5\' 6"  (167.6 cm) Weight: 149 lb 14.6 oz (68 kg) IBW/kg (Calculated) : 63.8 Heparin Dosing Weight: n/a  Vital Signs: Temp: 98.6 F (37 C) (12/28 0749) Temp Source: Oral (12/28 0749) BP: 130/87 (12/28 0749) Pulse Rate: 112 (12/28 0749)  Labs: Recent Labs    12/22/18 12/23/18 1128 12/23/18 1630 12/24/18 0227 12/24/18 0925  HGB  --  9.5*  --  10.5*  --   HCT  --  31.7*  --  33.5*  --   PLT  --  223  --  204  --   LABPROT  --   --  69.4*  --  55.8*  INR  --   --  8.60*  --  6.48*  CREATININE 7.4* 8.72*  --  5.30*  --     Estimated Creatinine Clearance: 10.4 mL/min (A) (by C-G formula based on SCr of 5.3 mg/dL (H)).   Medical History: Past Medical History:  Diagnosis Date  . A-fib ([place])   . Anemia   . Blood transfusion   . BPH (benign prostatic hyperplasia)   . CHF (congestive heart failure) ([place])   . Diarrhea   . DM (diabetes mellitus) ([place])   . ESRD on hemodialysis ([place])    Started dialysis in 2009  . History of GI bleed    secondary to coumadin  . HTN (hypertension)   . Hyperlipidemia   . OSA (obstructive sleep apnea)    uses CPAP  . Secondary hyperparathyroidism of renal origin ([place])     Medications:  Scheduled:  . acetaminophen  1,000 mg Oral TID  . atorvastatin  20 mg Oral q1800  . calcium acetate  2,668 mg Oral TID WC  . chlorhexidine  15 mL Mouth Rinse BID  . Chlorhexidine Gluconate Cloth  6 each Topical Q0600  . Chlorhexidine Gluconate Cloth  6 each Topical Q0600  . [START ON 12/30/2018] darbepoetin (ARANESP) injection - DIALYSIS  150 mcg Intravenous Q Fri-HD  . doxercalciferol  2 mcg Intravenous Q M,W,F-HD  . erythromycin   Left Eye Q8H   . mouth rinse  15 mL Mouth Rinse q12n4p  . methylPREDNISolone (SOLU-MEDROL) injection  80 mg Intravenous Q12H  . midodrine  10 mg Oral Q M,W,F-HD  . multivitamin  1 tablet Oral QHS  . pantoprazole  40 mg Oral BID  . phytonadione  2 mg Subcutaneous Once    Assessment: 77 yo male admitted for weakness.  On Coumadin PTA for afib, per pt report.  PTA Coumadin dose on hold for high INR.  INR downtrending, 8.6>6.48.  Phytonadione has been ordered. No overt bleeding or complications noted.  *Warfarin PTA dose: 3 mg daily  Goal of Therapy:  INR 2-3 Monitor platelets by anticoagulation protocol: Yes   Plan:  Hold Coumadin tonight Daily INR.   Claiborne Billings, PharmD PGY2 Cardiology Pharmacy Resident Phone [phone] Please check AMION for all Pharmacist numbers by unit 12/24/2018 10:06 AM

## 2018-12-24 NOTE — Progress Notes (Signed)
CRITICAL VALUE ALERT  Critical Value:  INR 6.48  Date & Time Notied:  12/24/2018 10:06am  Provider Notified: Dr. Reggy Eye  Orders Received/Actions taken: Waiting for orders.

## 2018-12-24 NOTE — Progress Notes (Signed)
Patient transported to dialysis

## 2018-12-24 NOTE — Progress Notes (Signed)
Patient is back from dialysis, alert and confused, in NAD. VSS.

## 2018-12-25 ENCOUNTER — Inpatient Hospital Stay (HOSPITAL_COMMUNITY): Payer: Medicare Other

## 2018-12-25 DIAGNOSIS — E1152 Type 2 diabetes mellitus with diabetic peripheral angiopathy with gangrene: Secondary | ICD-10-CM

## 2018-12-25 DIAGNOSIS — Z7901 Long term (current) use of anticoagulants: Secondary | ICD-10-CM

## 2018-12-25 DIAGNOSIS — Z5181 Encounter for therapeutic drug level monitoring: Secondary | ICD-10-CM

## 2018-12-25 DIAGNOSIS — Z515 Encounter for palliative care: Secondary | ICD-10-CM

## 2018-12-25 DIAGNOSIS — G9340 Encephalopathy, unspecified: Secondary | ICD-10-CM

## 2018-12-25 LAB — CBC
HCT: 31.2 % — ABNORMAL LOW (ref 39.0–52.0)
Hemoglobin: 9.6 g/dL — ABNORMAL LOW (ref 13.0–17.0)
MCH: 31.1 pg (ref 26.0–34.0)
MCHC: 30.8 g/dL (ref 30.0–36.0)
MCV: 101 fL — ABNORMAL HIGH (ref 80.0–100.0)
Platelets: 215 10*3/uL (ref 150–400)
RBC: 3.09 MIL/uL — AB (ref 4.22–5.81)
RDW: 17.5 % — ABNORMAL HIGH (ref 11.5–15.5)
WBC: 12.5 10*3/uL — ABNORMAL HIGH (ref 4.0–10.5)
nRBC: 0.3 % — ABNORMAL HIGH (ref 0.0–0.2)

## 2018-12-25 LAB — RENAL FUNCTION PANEL
ANION GAP: 13 (ref 5–15)
Albumin: 2.3 g/dL — ABNORMAL LOW (ref 3.5–5.0)
BUN: 25 mg/dL — ABNORMAL HIGH (ref 8–23)
CO2: 29 mmol/L (ref 22–32)
Calcium: 9.2 mg/dL (ref 8.9–10.3)
Chloride: 98 mmol/L (ref 98–111)
Creatinine, Ser: 3.19 mg/dL — ABNORMAL HIGH (ref 0.61–1.24)
GFR calc Af Amer: 20 mL/min — ABNORMAL LOW (ref >60)
GFR calc non Af Amer: 18 mL/min — ABNORMAL LOW (ref >60)
Glucose, Bld: 137 mg/dL — ABNORMAL HIGH (ref 70–99)
Phosphorus: 1.7 mg/dL — ABNORMAL LOW (ref 2.5–4.6)
Potassium: 3.7 mmol/L (ref 3.5–5.1)
Sodium: 140 mmol/L (ref 135–145)

## 2018-12-25 LAB — PROTIME-INR
INR: 4.05
Prothrombin Time: 38.7 seconds — ABNORMAL HIGH (ref 11.4–15.2)

## 2018-12-25 MED ORDER — CALCIUM ACETATE (PHOS BINDER) 667 MG PO CAPS
667.0000 mg | ORAL_CAPSULE | Freq: Three times a day (TID) | ORAL | Status: DC
  Administered 2018-12-25 – 2018-12-26 (×3): 667 mg via ORAL
  Filled 2018-12-25 (×4): qty 1

## 2018-12-25 MED ORDER — HYDROCERIN EX CREA
TOPICAL_CREAM | Freq: Two times a day (BID) | CUTANEOUS | Status: DC
  Administered 2018-12-25 – 2019-01-02 (×10): via TOPICAL
  Administered 2019-01-03: 1 via TOPICAL
  Filled 2018-12-25 (×2): qty 113

## 2018-12-25 MED ORDER — VANCOMYCIN HCL IN DEXTROSE 750-5 MG/150ML-% IV SOLN
750.0000 mg | Freq: Once | INTRAVENOUS | Status: AC
  Administered 2018-12-25: 750 mg via INTRAVENOUS
  Filled 2018-12-25: qty 150

## 2018-12-25 NOTE — Consult Note (Signed)
[facility] Nurse wound consult note Reason for Consult: Consult placed for giving Nursing direction in wound care following amputation on 12/06/18 (Dr. Doren Custard) at the lateral left foot.  Also noted is partial thickness tissue loss at the posterior right LE at the Achille's region.  Vascular (Dr. Donnetta Hutching) has been in to see today and recommends Palliative approach to wound care or bilateral amputations. Wound type: arterial insufficiency Pressure Injury POA: N/A Measurement: Left lateral foot:  7cm x 4cm x 1.4cm defect with periwound eschar, pale red wound bed and serous exudate. Right posterior LE:  1cm round x 0.1cm red, dry wound bed. Wound bed: As described above Drainage (amount, consistency, odor)  Periwound: Bilateral feet are dry, cracked and peeling with evidence of poor arterial flow. Dressing procedure/placement/frequency: I will implement a conservative POC with xeroform gauze to the right posterior LE and saline moistened gauze dressings to the left lateral foot.  We will wash LEs with soap and water, rinsing wounds with NS and applying Eucerin cream to moisturize feet prior to wound care.    [place] nursing team will not follow, but will remain available to this patient, the nursing and medical teams.  Please re-consult if needed. Thanks, Maudie Flakes, MSN, RN, [place], Arther Abbott  Pager# [phone]

## 2018-12-25 NOTE — Progress Notes (Addendum)
PROGRESS NOTE    [name]   BHA:[id number]  DOB: 10-17-1941  DOA: 12/23/2018 PCP: Hennie Duos, MD   Brief Narrative:    [name] is a 77 y.o. male with medical history significant for dementia, ESRD on HD; DM; OSA on CPAP; HTN; HLD; CHF; PVD with L 5th ray amputation on 12/10; and afib on Coumadin presenting with weakness. He has dementia and is unable to give history.  Sent from [place] for subjective fevers, weakness. He underwent left fifth toe amputation on 12/10 secondary to gangrene. The staff spoke with Vascular surgery and was recommended to send patient to ED. He had received a dose of IM Ceftraixone the day before.  EMS reports the patient was also noted to be short of breath on arrival. He was on 3L [place]. In ED, diagnosed with multifocal pneumonia and started on Vancomycin, Levaquin and Cefepime. In ED > ED, WBC 17.9, T-99 rectal, BP 115/63, HR 103. CXR shows Asymmetric airspace pulmonary edema versus multifocal bilateral pneumonia, R>L. BC X 2 ordered.  Was dialyzed overnight as he was volume overloaded per nephro.  Subjective: Oriented to person and place today. He has no complaints. Eating breakfast.     Assessment & Plan:   Principal Problem:   Subjective fevers, leukocytosis, acute respiratory failure, tachycardia - ?HCAP vs infected post op foot wound (foul smelling with discharge) suspected as a source of infectionl -   blood  Cultures neg - he was dialyzed x 2 in the first 24 hrs to remove excess fluid but continues to have b/l infiltrates on CXR- continues to have coarse RLL crackles-  Cont treatment for HCAP and gangrenous foot wound with Vanc and Cefepime  Active Problems:  Diabetic wet gangrene of the foot, PVD - s/p left superficial femoral artery occlusion and posterior tibial angioplasty with Dr. Trula Slade on 11/15/2018 - 12/10 Ray amputation left fifth toe- dr Scot Dock  - Vascular surgery recommends palliative care vs b/l amputations -  palliative care consulted on 12/28 - WOC has made recommendations for wound care  ESRD on hemodialysis / fluid overloaded - d/w Dr Deterding who feels he was fluid overloaded- back to back dialysis x 2 done - on Midodrine as outpt on dialysis    PAF (paroxysmal atrial fibrillation)/ coagulopathy - INR ~8 on admission- steadily improving - - hold coumadin - Lopressor resumed- HR in low 100s    Dementia with possible acute encephalopathy  - baseline unknown- seems improved from admission    OBSTRUCTIVE SLEEP APNEA - CPAP ordered  Conjuctivitis -Treated on 12/24 with garamycin drops -Appears to only be present on the left at this time -cont erythromycin ophthalmic ointment  GOC> ed- Dr Deterding knows the patient very well and has recommended palliative care whom I have consulted  DVT prophylaxis: INR elevated Code Status: DNR Family Communication: Consultants:   Nephrology  Vascular surgery Procedures:   none Antimicrobials:  Anti-infectives (From admission, onward)   Start     Dose/Rate Route Frequency Ordered Stop   12/26/18 1200  vancomycin (VANCOCIN) IVPB 750 mg/150 ml premix     750 mg 150 mL/hr over 60 Minutes Intravenous Every M-W-F (Hemodialysis) 12/23/18 1231     12/25/18 1800  levofloxacin (LEVAQUIN) IVPB 500 mg     500 mg 100 mL/hr over 60 Minutes Intravenous Every 48 hours 12/23/18 1805     12/25/18 0745  vancomycin (VANCOCIN) IVPB 750 mg/150 ml premix     750 mg 150 mL/hr over 60  Minutes Intravenous  Once 12/25/18 0723     12/24/18 1300  ceFEPIme (MAXIPIME) 1 g in sodium chloride 0.9 % 100 mL IVPB     1 g 200 mL/hr over 30 Minutes Intravenous Every 24 hours 12/23/18 1230     12/24/18 0200  vancomycin (VANCOCIN) IVPB 750 mg/150 ml premix     750 mg 150 mL/hr over 60 Minutes Intravenous  Once 12/23/18 2049 12/24/18 0150   12/23/18 1815  levofloxacin (LEVAQUIN) IVPB 750 mg     750 mg 100 mL/hr over 90 Minutes Intravenous  Once 12/23/18 1805      12/23/18 1245  vancomycin (VANCOCIN) 1,500 mg in sodium chloride 0.9 % 500 mL IVPB     1,500 mg 250 mL/hr over 120 Minutes Intravenous  Once 12/23/18 1230 12/23/18 1536   12/23/18 1245  ceFEPIme (MAXIPIME) 2 g in sodium chloride 0.9 % 100 mL IVPB     2 g 200 mL/hr over 30 Minutes Intravenous  Once 12/23/18 1230 12/23/18 1325       Objective: Vitals:   12/24/18 2243 12/24/18 2300 12/25/18 0337 12/25/18 0733  BP: (!) 157/71  131/73 118/77  Pulse: (!) 108     Resp:   (!) 21   Temp:  98 F (36.7 C) (!) 97.1 F (36.2 C) 97.7 F (36.5 C)  TempSrc:  Oral Oral   SpO2:      Weight:      Height:        Intake/Output Summary (Last 24 hours) at 12/25/2018 0842 Last data filed at 12/24/2018 1756 Gross per 24 hour  Intake -  Output 3000 ml  Net -3000 ml   Filed Weights   12/24/18 0209 12/24/18 1350 12/24/18 1756  Weight: 68 kg 70 kg 68 kg    Examination: General exam: Appears comfortable  HEENT: PERRL, mouth dry Respiratory system: continues to have RLL crackes - pulse ox in 90s on room air Cardiovascular system: S1 & S2 heard, IIRR,  2/6 murmur at LUS border Gastrointestinal system: Abdomen soft, non-tender, nondistended. Normal bowel sounds. Central nervous system: Alert-disoriented to time - No focal neurological deficits. Skin: foul smelling wound left foot   Psychiatry:  Mood  appropriate.       Data Reviewed: I have personally reviewed following labs and imaging studies  CBC: Recent Labs  Lab 12/23/18 1128 12/24/18 0227 12/25/18 0254  WBC 17.9* 15.6* 12.5*  NEUTROABS 15.5* 13.0*  --   HGB 9.5* 10.5* 9.6*  HCT 31.7* 33.5* 31.2*  MCV 100.6* 99.4 101.0*  PLT 223 204 734   Basic Metabolic Panel: Recent Labs  Lab 12/22/18 12/23/18 1128 12/24/18 0227 12/25/18 0254  NA 144 143 143 140  K 3.8 3.9 3.8 3.7  CL  --  99 99 98  CO2  --  28 27 29   GLUCOSE  --  193* 109* 137*  BUN 65* 83* 48* 25*  CREATININE 7.4* 8.72* 5.30* 3.19*  CALCIUM  --  10.4* 9.8 9.2    PHOS  --   --  1.8* 1.7*   GFR: Estimated Creatinine Clearance: 17.2 mL/min (A) (by C-G formula based on SCr of 3.19 mg/dL (H)). Liver Function Tests: Recent Labs  Lab 12/23/18 1128 12/24/18 0227 12/25/18 0254  AST 26  --   --   ALT 20  --   --   ALKPHOS 290*  --   --   BILITOT 0.9  --   --   PROT 7.0  --   --  ALBUMIN 2.4* 2.5* 2.3*   No results for input(s): LIPASE, AMYLASE in the last 168 hours. No results for input(s): AMMONIA in the last 168 hours. Coagulation Profile: Recent Labs  Lab 12/23/18 1630 12/24/18 0925 12/25/18 0254  INR 8.60* 6.48* 4.05*   Cardiac Enzymes: No results for input(s): CKTOTAL, CKMB, CKMBINDEX, TROPONINI in the last 168 hours. BNP (last 3 results) No results for input(s): PROBNP in the last 8760 hours. HbA1C: No results for input(s): HGBA1C in the last 72 hours. CBG: No results for input(s): GLUCAP in the last 168 hours. Lipid Profile: No results for input(s): CHOL, HDL, LDLCALC, TRIG, CHOLHDL, LDLDIRECT in the last 72 hours. Thyroid Function Tests: No results for input(s): TSH, T4TOTAL, FREET4, T3FREE, THYROIDAB in the last 72 hours. Anemia Panel: No results for input(s): VITAMINB12, FOLATE, FERRITIN, TIBC, IRON, RETICCTPCT in the last 72 hours. Urine analysis:    Component Value Date/Time   COLORURINE YELLOW 10/20/2008 [place] 10/20/2008 0942   LABSPEC 1.011 10/20/2008 0942   PHURINE 6.0 10/20/2008 0942   GLUCOSEU NEGATIVE 10/20/2008 0942   HGBUR LARGE (A) 10/20/2008 0942   BILIRUBINUR NEGATIVE 10/20/2008 0942   KETONESUR NEGATIVE 10/20/2008 0942   PROTEINUR 100 (A) 10/20/2008 0942   UROBILINOGEN 1.0 10/20/2008 0942   NITRITE NEGATIVE 10/20/2008 0942   LEUKOCYTESUR SMALL (A) 10/20/2008 0942   Sepsis Labs: @LABRCNTIP (procalcitonin:4,lacticidven:4) ) Recent Results (from the past 240 hour(s))  Blood Culture (routine x 2)     Status: None (Preliminary result)   Collection Time: 12/23/18 11:38 AM  Result  Value Ref Range Status   Specimen Description BLOOD RIGHT ANTECUBITAL  Final   Special Requests   Final    BOTTLES DRAWN AEROBIC AND ANAEROBIC Blood Culture adequate volume   Culture   Final    NO GROWTH 1 DAY Performed at [facility], [place] [address], [place], [place] [address]    Report Status PENDING  Incomplete  Blood Culture (routine x 2)     Status: None (Preliminary result)   Collection Time: 12/23/18 12:45 PM  Result Value Ref Range Status   Specimen Description BLOOD RIGHT ANTECUBITAL  Final   Special Requests AEROBIC BOTTLE ONLY Blood Culture adequate volume  Final   Culture   Final    NO GROWTH < 24 HOURS Performed at [facility], [place] [address] [place], [place] [address]    Report Status PENDING  Incomplete  MRSA PCR Screening     Status: None   Collection Time: 12/23/18  5:08 PM  Result Value Ref Range Status   MRSA by PCR NEGATIVE NEGATIVE Final    Comment:        The GeneXpert MRSA Assay (FDA approved for NASAL specimens only), is one component of a comprehensive MRSA colonization surveillance program. It is not intended to diagnose MRSA infection nor to guide or monitor treatment for MRSA infections. Performed at [facility], [place] [address], [place], [place] [address]          Radiology Studies: Dg Chest Port 1 View  Result Date: 12/23/2018 CLINICAL DATA:  Lethargy. Leukocytosis. Current history of hypertension, end-stage renal disease, CHF and atrial fibrillation. EXAM: PORTABLE CHEST 1 VIEW COMPARISON:  11/09/2018 and earlier. FINDINGS: Cardiac silhouette moderately enlarged, unchanged. Thoracic aorta atherosclerotic, unchanged. Airspace opacities throughout both lungs, asymmetric and increased in the RIGHT lung, new since the most recent prior examination 11/09/2018. IMPRESSION: Asymmetric airspace pulmonary edema versus multifocal BILATERAL pneumonia, RIGHT greater than LEFT.  Electronically Signed   By: Evangeline Dakin M.D.   On:  12/23/2018 12:00      Scheduled Meds: . acetaminophen  1,000 mg Oral TID  . atorvastatin  20 mg Oral q1800  . calcium acetate  2,668 mg Oral TID WC  . chlorhexidine  15 mL Mouth Rinse BID  . Chlorhexidine Gluconate Cloth  6 each Topical Q0600  . Chlorhexidine Gluconate Cloth  6 each Topical Q0600  . [START ON 12/30/2018] darbepoetin (ARANESP) injection - DIALYSIS  150 mcg Intravenous Q Fri-HD  . doxercalciferol  2 mcg Intravenous Q M,W,F-HD  . erythromycin   Left Eye Q8H  . mouth rinse  15 mL Mouth Rinse q12n4p  . metoprolol tartrate  25 mg Oral BID  . midodrine  10 mg Oral Q M,W,F-HD  . multivitamin  1 tablet Oral QHS  . pantoprazole  40 mg Oral BID  . phytonadione  2 mg Subcutaneous Once   Continuous Infusions: . ceFEPime (MAXIPIME) IV    . levofloxacin (LEVAQUIN) IV    . levofloxacin (LEVAQUIN) IV    . [START ON 12/26/2018] vancomycin    . vancomycin       LOS: 2 days    Time spent in minutes: 5 min    Debbe Odea, MD [facility] Pager: [email or web address] Password [facility] 12/25/2018, 8:42 AM

## 2018-12-25 NOTE — Consult Note (Signed)
Palliative Medicine   Name: [name] Date: 12/25/2018 MRN: [id number]  DOB: 10/08/1941  Patient Care Team: Hennie Duos, MD as PCP - General (Internal Medicine) [name], MD as Consulting Physician (Nephrology) Rehab, [facility], [place]: Palliative Care consult requested for this 77 y.o. male with multiple medical problems including ESRD on HD, OSA on CPAP, DM, diastolic dysfunction with history of CHF, A. fib on Coumadin, PVD with recent left fifth ray amputation, who was recently hospitalized 11/06/2018 to 11/17/2018 with acute CVA and hypoxic respiratory failure from volume overload and was readmitted 12/01/2018 to 12/04/2018 with symptomatic anemia thought likely due to lower GI bleed.  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  He is now readmitted from [place] facility on 12/23/2018 with weakness, leukocytosis, and shortness of breath.  He was found to have multifocal pneumonia versus infected postop foot wound.  Vascular surgery has consulted and feels that patient would likely need bilateral lower extremity amputation.  Palliative care was consulted to help address goals.   SOCIAL HISTORY:     reports that he quit smoking about 14 years ago. His smoking use included cigarettes. He has never used smokeless tobacco. He reports that he does not drink alcohol or use drugs.  ADVANCE DIRECTIVES:  Durable POA documents on file from 09/2012 naming daughter, [name], his POA.  CODE STATUS: DNR  PAST MEDICAL HISTORY: Past Medical History:  Diagnosis Date  . A-fib ([place])   . Anemia   . Blood transfusion   . BPH (benign prostatic hyperplasia)   . CHF (congestive heart failure) ([place])   . Diarrhea   . DM (diabetes mellitus) ([place])   . ESRD on hemodialysis ([place])    Started dialysis in 2009  . History of GI bleed    secondary to coumadin  . HTN (hypertension)   . Hyperlipidemia   . OSA (obstructive sleep apnea)    uses CPAP  . Secondary hyperparathyroidism of renal origin [facility]     PAST SURGICAL HISTORY:  Past Surgical History:  Procedure Laterality Date  . ABDOMINAL AORTOGRAM N/A 11/15/2018   Procedure: ABDOMINAL AORTOGRAM;  Surgeon: [name], MD;  Location: [facility];  Service: Cardiovascular;  Laterality: N/A;  . AMPUTATION Left 12/06/2018   Procedure: AMPUTATION DIGIT LEFT FIFTH TOE;  Surgeon: Angelia Mould, MD;  Location: [facility];  Service: Vascular;  Laterality: Left;  . BVT  08/05/97   Left  Basilic Vein Transposition  . CHOLECYSTECTOMY    . EYE SURGERY     Catarct bil  . INSERTION OF DIALYSIS CATHETER  05/28/2012   Procedure: INSERTION OF DIALYSIS CATHETER;  Surgeon: Mal Misty, MD;  Location: [facility];  Service: Vascular;  Laterality: Right;  . Left arm shuntogram.    . Left forearm loop graft with 6 mm Gore-Tex graft.    . LOWER EXTREMITY ANGIOGRAPHY Bilateral 11/15/2018   Procedure: LOWER EXTREMITY ANGIOGRAPHY;  Surgeon: [name], MD;  Location: [facility];  Service: Cardiovascular;  Laterality: Bilateral;  . Pars plana vitrectomy with 25-gauge system    . PERIPHERAL VASCULAR ATHERECTOMY Left 11/15/2018   Procedure: PERIPHERAL VASCULAR ATHERECTOMY;  Surgeon: [name], MD;  Location: [place] [facility];  Service: Cardiovascular;  Laterality: Left;  SFA with STENT  . PERIPHERAL VASCULAR BALLOON ANGIOPLASTY Left 11/15/2018   Procedure:  PERIPHERAL VASCULAR BALLOON ANGIOPLASTY;  Surgeon: [name], MD;  Location: [facility];  Service: Cardiovascular;  Laterality: Left;  PT    HEMATOLOGY/ONCOLOGY HISTORY:   No history exists.    ALLERGIES:  is allergic to occlusive silicone sheets [silicone]; other; and tape.  MEDICATIONS:  Current Facility-Administered Medications  Medication Dose Route Frequency Provider Last Rate Last  Dose  . acetaminophen (TYLENOL) tablet 650 mg  650 mg Oral Q6H PRN Karmen Bongo, MD       Or  . acetaminophen (TYLENOL) suppository 650 mg  650 mg Rectal Q6H PRN Karmen Bongo, MD      . acetaminophen (TYLENOL) tablet 1,000 mg  1,000 mg Oral TID Karmen Bongo, MD   1,000 mg at 12/24/18 1957  . atorvastatin (LIPITOR) tablet 20 mg  20 mg Oral q1800 Karmen Bongo, MD   20 mg at 12/24/18 1955  . calcium acetate (PHOSLO) capsule 667 mg  667 mg Oral TID WC Dwana Melena, MD      . calcium carbonate (dosed in mg elemental calcium) suspension 500 mg of elemental calcium  500 mg of elemental calcium Oral Q6H PRN Karmen Bongo, MD      . camphor-menthol [facility]) lotion 1 application  1 application Topical J1O PRN Karmen Bongo, MD       And  . hydrOXYzine (ATARAX/VISTARIL) tablet 25 mg  25 mg Oral Q8H PRN Karmen Bongo, MD      . ceFEPIme (MAXIPIME) 1 g in sodium chloride 0.9 % 100 mL IVPB  1 g Intravenous Q24H Karmen Bongo, MD      . chlorhexidine (PERIDEX) 0.12 % solution 15 mL  15 mL Mouth Rinse BID Karmen Bongo, MD   15 mL at 12/24/18 2241  . Chlorhexidine Gluconate Cloth 2 % PADS 6 each  6 each Topical Q0600 Valentina Gu, NP   6 each at 12/25/18 0557  . Chlorhexidine Gluconate Cloth 2 % PADS 6 each  6 each Topical A4166 Mauricia Area, MD   6 each at 12/25/18 0557  . [START ON 12/30/2018] Darbepoetin Alfa (ARANESP) injection 150 mcg  150 mcg Intravenous Q Fri-HD Deterding, Jeneen Rinks, MD      . docusate sodium [facility]) enema 283 mg  1 enema Rectal PRN Karmen Bongo, MD      . doxercalciferol (HECTOROL) injection 2 mcg  2 mcg Intravenous Q M,W,F-HD Karmen Bongo, MD   2 mcg at 12/23/18 2216  . erythromycin ophthalmic ointment   Left Eye Q8H Karmen Bongo, MD      . feeding supplement (NEPRO CARB STEADY) liquid 237 mL  237 mL Oral TID PRN Karmen Bongo, MD      . hydrocerin (EUCERIN) cream   Topical BID Debbe Odea, MD      . MEDLINE mouth rinse  15 mL Mouth Rinse  q12n4p Karmen Bongo, MD      . metoprolol tartrate (LOPRESSOR) tablet 25 mg  25 mg Oral BID Debbe Odea, MD   25 mg at 12/25/18 0905  . midodrine (PROAMATINE) tablet 10 mg  10 mg Oral Q M,W,F-HD Karmen Bongo, MD   10 mg at 12/23/18 1731  . multivitamin (RENA-VIT) tablet 1 tablet  1 tablet Oral Nile Riggs, MD   1 tablet at 12/24/18 2240  . ondansetron (ZOFRAN) tablet 4 mg  4 mg Oral Q6H PRN Karmen Bongo, MD       Or  . ondansetron [facility]) injection 4 mg  4 mg Intravenous Q6H PRN Karmen Bongo, MD  4 mg at 12/24/18 2249  . oxyCODONE (Oxy IR/ROXICODONE) immediate release tablet 5 mg  5 mg Oral Q4H PRN Karmen Bongo, MD      . pantoprazole (PROTONIX) EC tablet 40 mg  40 mg Oral BID Karmen Bongo, MD   40 mg at 12/24/18 2241  . sorbitol 70 % solution 30 mL  30 mL Oral PRN Karmen Bongo, MD      . Derrill Memo ON 12/26/2018] vancomycin (VANCOCIN) IVPB 750 mg/150 ml premix  750 mg Intravenous Q M,W,F-HD Karmen Bongo, MD      . zolpidem (AMBIEN) tablet 5 mg  5 mg Oral QHS PRN Karmen Bongo, MD        VITAL SIGNS: BP 118/77 (BP Location: Right Arm)   Pulse (!) 103   Temp 97.7 F (36.5 C)   Resp (!) 26   Ht 5\' 6"  (1.676 m)   Wt 68 kg   SpO2 95%   BMI 24.20 kg/m  Filed Weights   12/24/18 0209 12/24/18 1350 12/24/18 1756  Weight: 68 kg 70 kg 68 kg    Estimated body mass index is 24.2 kg/m as calculated from the following:   Height as of this encounter: 5\' 6"  (1.676 m).   Weight as of this encounter: 68 kg.  LABS: CBC:    Component Value Date/Time   WBC 12.5 (H) 12/25/2018 0254   HGB 9.6 (L) 12/25/2018 0254   HCT 31.2 (L) 12/25/2018 0254   HCT 27.1 (L) 11/09/2018 0230   PLT 215 12/25/2018 0254   MCV 101.0 (H) 12/25/2018 0254   NEUTROABS 13.0 (H) 12/24/2018 0227   LYMPHSABS 0.9 12/24/2018 0227   MONOABS 1.2 (H) 12/24/2018 0227   EOSABS 0.2 12/24/2018 0227   BASOSABS 0.1 12/24/2018 0227   Comprehensive Metabolic Panel:    Component Value Date/Time    NA 140 12/25/2018 0254   NA 144 12/22/2018   K 3.7 12/25/2018 0254   CL 98 12/25/2018 0254   CO2 29 12/25/2018 0254   BUN 25 (H) 12/25/2018 0254   BUN 65 (A) 12/22/2018   CREATININE 3.19 (H) 12/25/2018 0254   GLUCOSE 137 (H) 12/25/2018 0254   CALCIUM 9.2 12/25/2018 0254   CALCIUM 8.3 (L) 10/25/2008 1450   AST 26 12/23/2018 1128   ALT 20 12/23/2018 1128   ALKPHOS 290 (H) 12/23/2018 1128   BILITOT 0.9 12/23/2018 1128   PROT 7.0 12/23/2018 1128   ALBUMIN 2.3 (L) 12/25/2018 0254    RADIOGRAPHIC STUDIES: Dg Chest 2 View  Result Date: 12/25/2018 CLINICAL DATA:  Patient with history of pneumonia. EXAM: CHEST - 2 VIEW COMPARISON:  Chest radiograph 12/23/2018 FINDINGS: Monitoring leads overlie the patient. Stable cardiomegaly. Aortic atherosclerosis. Slight interval improvement in previously described bilateral airspace opacities. Probable small bilateral pleural effusions. No pneumothorax. IMPRESSION: Slight interval improvement bilateral airspace opacities may represent improving edema. Underlying infection not excluded. Electronically Signed   By: Lovey Newcomer M.D.   On: 12/25/2018 09:56   Ct Head Wo Contrast  Result Date: 12/02/2018 CLINICAL DATA:  77 year old male with fall. EXAM: CT HEAD WITHOUT CONTRAST TECHNIQUE: Contiguous axial images were obtained from the base of the skull through the vertex without intravenous contrast. COMPARISON:  Head CT dated 10/20/2018 FINDINGS: Evaluation of this exam is limited due to motion artifact. Brain: There is moderate age-related atrophy and chronic microvascular ischemic changes. Left parietal old infarct noted. There is no acute intracranial hemorrhage. No mass effect or midline shift. No extra-axial fluid collection. Vascular: No hyperdense vessel or  unexpected calcification. Skull: Normal. Negative for fracture or focal lesion. Sinuses/Orbits: The visualized paranasal sinuses are clear. Mild bilateral mastoid effusions noted. Other: None IMPRESSION:  1. No definite acute intracranial hemorrhage on this motion degraded exam. 2. Moderate age-related atrophy and chronic microvascular ischemic changes. Old left parietal infarct. Electronically Signed   By: Anner Crete M.D.   On: 12/02/2018 00:24   Dg Chest Port 1 View  Result Date: 12/23/2018 CLINICAL DATA:  Lethargy. Leukocytosis. Current history of hypertension, end-stage renal disease, CHF and atrial fibrillation. EXAM: PORTABLE CHEST 1 VIEW COMPARISON:  11/09/2018 and earlier. FINDINGS: Cardiac silhouette moderately enlarged, unchanged. Thoracic aorta atherosclerotic, unchanged. Airspace opacities throughout both lungs, asymmetric and increased in the RIGHT lung, new since the most recent prior examination 11/09/2018. IMPRESSION: Asymmetric airspace pulmonary edema versus multifocal BILATERAL pneumonia, RIGHT greater than LEFT. Electronically Signed   By: Evangeline Dakin M.D.   On: 12/23/2018 12:00   Vas [place] Burnard Bunting With/wo Tbi  Result Date: 12/13/2018 LOWER EXTREMITY DOPPLER STUDY Indications: Peripheral artery disease, and Bilateral foot bandage. High Risk Factors: Hypertension, Diabetes, past history of smoking.  Vascular Interventions: Atherectomy left superficial femoral-popliteal artery,                         angioplasty left posterior tibial artery 11/15/2018. Performing Technologist: Alvia Grove RVT  Examination Guidelines: A complete evaluation includes at minimum, Doppler waveform signals and systolic blood pressure reading at the level of bilateral brachial, anterior tibial, and posterior tibial arteries, when vessel segments are accessible. Bilateral testing is considered an integral part of a complete examination. Photoelectric Plethysmograph (PPG) waveforms and toe systolic pressure readings are included as required and additional duplex testing as needed. Limited examinations for reoccurring indications may be performed as noted.  ABI Findings:  +--------+------------------+-----+----------+--------+ Right   Rt Pressure (mmHg)IndexWaveform  Comment  +--------+------------------+-----+----------+--------+ [id number]                                        +--------+------------------+-----+----------+--------+ PTA     98                1.01 monophasic         +--------+------------------+-----+----------+--------+ DP      99                1.02 monophasic         +--------+------------------+-----+----------+--------+ +--------+------------------+-----+----------+-------+ Left    Lt Pressure (mmHg)IndexWaveform  Comment +--------+------------------+-----+----------+-------+ Brachial                                 fistula +--------+------------------+-----+----------+-------+ PTA     156               1.61 monophasic        +--------+------------------+-----+----------+-------+ DP      93                0.96 monophasic        +--------+------------------+-----+----------+-------+ +-------+-----------+-----------+------------+------------+ ABI/TBIToday's ABIToday's TBIPrevious ABIPrevious TBI +-------+-----------+-----------+------------+------------+ Right  1.02                  0.63                     +-------+-----------+-----------+------------+------------+ Left   1.61  0.58                     +-------+-----------+-----------+------------+------------+  Summary: Right: Resting right ankle-brachial index indicates noncompressible right lower extremity arteries. Left: Resting left ankle-brachial index indicates noncompressible left lower extremity arteries. Unable to obtain toe-brachial indeces due to bandages and movement. Technically difficult exam, exam performed in wheel chair.   *See table(s) above for measurements and observations.   Electronically signed by Monica Martinez MD on 12/13/2018 at 9:57:54 AM.    Final     PERFORMANCE STATUS (ECOG) : 4 -  Bedbound  Review of Systems As noted above. Otherwise, a complete review of systems is negative.  Physical Exam General: NAD, frail appearing, lying in bed Cardiovascular: Irregular Pulmonary: clear ant fields Abdomen: soft, nontender, + bowel sounds Extremities: Foot wounds noted Neurological: Weakness, confusion  IMPRESSION: Patient is alert and comfortable appearing.  He is pleasantly confused and does not know he is in the hospital.  No family is present.  I attempted twice to call patient's daughter on both her cell and home numbers without success. Will try to reach family to clarify goals.   Symptomatically, patient denies any pain or other distressing symptoms. He does have oxycodone ordered prn but has not required any recently.   Case discussed with nursing staff who had no acute concerns.   Patient would likely otherwise be a candidate for hospice involvement in the event that family opted not to pursue amputation. However, concurrent hemodialysis would exclude that option unless family made him full comfort care and stopped dialysis. He can certainly be [facility] at [facility]. Again, will need to clarify goals with family.   PLAN: Continue current scope of treatment Will continue to try to reach family to clarify goals.    Time Total: 30 minutes  Visit consisted of counseling and education dealing with the complex and emotionally intense issues of symptom management and palliative care in the setting of serious and potentially life-threatening illness.Greater than 50%  of this time was spent counseling and coordinating care related to the above assessment and plan.  Signed by: Altha Harm, PhD, NP-C [phone] (Work Cell)

## 2018-12-25 NOTE — Progress Notes (Signed)
Patient ID: [name], male   DOB: 1941-02-27, 77 y.o.   MRN: [id number]                                     Patient name: [name] MRN: [id number] DOB: 01-10-1941 Sex: male    HPI: [name] is a 77 y.o. male seen for bilateral lower extremity ulceration.  Known to our service from recent intervention and left fifth toe open amputation.  This was by Dr. Doren Custard on 12/06/2018.  He had had treatment of left superficial femoral artery occlusion and posterior tibial angioplasty with Dr. Trula Slade on 11/15/2018.  He is admitted with progressive continued clinical deterioration  Current Facility-Administered Medications  Medication Dose Route Frequency Provider Last Rate Last Dose  . acetaminophen (TYLENOL) tablet 650 mg  650 mg Oral Q6H PRN Karmen Bongo, MD       Or  . acetaminophen (TYLENOL) suppository 650 mg  650 mg Rectal Q6H PRN Karmen Bongo, MD      . acetaminophen (TYLENOL) tablet 1,000 mg  1,000 mg Oral TID Karmen Bongo, MD   1,000 mg at 12/24/18 1957  . atorvastatin (LIPITOR) tablet 20 mg  20 mg Oral q1800 Karmen Bongo, MD   20 mg at 12/24/18 1955  . calcium acetate (PHOSLO) capsule 667 mg  667 mg Oral TID WC Dwana Melena, MD      . calcium carbonate (dosed in mg elemental calcium) suspension 500 mg of elemental calcium  500 mg of elemental calcium Oral Q6H PRN Karmen Bongo, MD      . camphor-menthol [facility]) lotion 1 application  1 application Topical J6R PRN Karmen Bongo, MD       And  . hydrOXYzine (ATARAX/VISTARIL) tablet 25 mg  25 mg Oral Q8H PRN Karmen Bongo, MD      . ceFEPIme (MAXIPIME) 1 g in sodium chloride 0.9 % 100 mL IVPB  1 g Intravenous Q24H Karmen Bongo, MD      . chlorhexidine (PERIDEX) 0.12 % solution 15 mL  15 mL Mouth Rinse BID Karmen Bongo, MD   15 mL at 12/24/18 2241  . Chlorhexidine Gluconate Cloth 2 % PADS 6 each  6 each Topical Q0600 Valentina Gu, NP   6 each at 12/25/18 0557  . Chlorhexidine Gluconate Cloth 2 % PADS  6 each  6 each Topical C7893 Mauricia Area, MD   6 each at 12/25/18 0557  . [START ON 12/30/2018] Darbepoetin Alfa (ARANESP) injection 150 mcg  150 mcg Intravenous Q Fri-HD Deterding, Jeneen Rinks, MD      . docusate sodium [facility]) enema 283 mg  1 enema Rectal PRN Karmen Bongo, MD      . doxercalciferol (HECTOROL) injection 2 mcg  2 mcg Intravenous Q M,W,F-HD Karmen Bongo, MD   2 mcg at 12/23/18 2216  . erythromycin ophthalmic ointment   Left Eye Q8H Karmen Bongo, MD      . feeding supplement (NEPRO CARB STEADY) liquid 237 mL  237 mL Oral TID PRN Karmen Bongo, MD      . levofloxacin (LEVAQUIN) IVPB 500 mg  500 mg Intravenous Q48H Carney, Jessica C, RPH      . levofloxacin (LEVAQUIN) IVPB 750 mg  750 mg Intravenous Once Pat Patrick, RPH      . MEDLINE mouth rinse  15 mL Mouth Rinse q12n4p Karmen Bongo, MD      . metoprolol  tartrate (LOPRESSOR) tablet 25 mg  25 mg Oral BID Debbe Odea, MD   25 mg at 12/25/18 0905  . midodrine (PROAMATINE) tablet 10 mg  10 mg Oral Q M,W,F-HD Karmen Bongo, MD   10 mg at 12/23/18 1731  . multivitamin (RENA-VIT) tablet 1 tablet  1 tablet Oral Nile Riggs, MD   1 tablet at 12/24/18 2240  . ondansetron (ZOFRAN) tablet 4 mg  4 mg Oral Q6H PRN Karmen Bongo, MD       Or  . ondansetron [facility]) injection 4 mg  4 mg Intravenous Q6H PRN Karmen Bongo, MD   4 mg at 12/24/18 2249  . oxyCODONE (Oxy IR/ROXICODONE) immediate release tablet 5 mg  5 mg Oral Q4H PRN Karmen Bongo, MD      . pantoprazole (PROTONIX) EC tablet 40 mg  40 mg Oral BID Karmen Bongo, MD   40 mg at 12/24/18 2241  . phytonadione (VITAMIN K) SQ injection 2 mg  2 mg Subcutaneous Once Rizwan, Eunice Blase, MD      . sorbitol 70 % solution 30 mL  30 mL Oral PRN Karmen Bongo, MD      . Derrill Memo ON 12/26/2018] vancomycin (VANCOCIN) IVPB 750 mg/150 ml premix  750 mg Intravenous Q M,W,F-HD Karmen Bongo, MD      . vancomycin (VANCOCIN) IVPB 750 mg/150 ml premix  750 mg Intravenous  Once Debbe Odea, MD 150 mL/hr at 12/25/18 0853 750 mg at 12/25/18 0853  . zolpidem (AMBIEN) tablet 5 mg  5 mg Oral QHS PRN Karmen Bongo, MD         PHYSICAL EXAM: Vitals:   12/25/18 4174 12/25/18 0733 12/25/18 0854 12/25/18 0858  BP: 131/73 118/77    Pulse:   (!) 105 (!) 111  Resp: (!) 21  20 (!) 24  Temp: (!) 97.1 F (36.2 C) 97.7 F (36.5 C)    TempSrc: Oral     SpO2:   (!) 88% (!) 83%  Weight:      Height:        GENERAL: The patient is a well-nourished male, in no acute distress. The vital signs are documented above. Amputation on the left fifth toe was not healing.  Has a necrotic debris at the base of the open amputation.  On his right leg he has full-thickness loss of this posterior aspect of his heel skin  MEDICAL ISSUES: Progressive continued clinical deterioration.  I certainly agree with plan for discussion for palliative care.  Only option would be bilateral amputations.  Will not follow actively.  Please call if we can assist   Rosetta Posner, MD [facility] Tel [phone] Pager [phone]

## 2018-12-25 NOTE — Progress Notes (Signed)
[facility] Progress Note   AF MWF  4hr lt AVF Hect 58mcg IV qtx Mircera 200 mcg q2weeks (last 12/18/18) No heparin Assessment/ Plan:   1 ESRD better, still vol xs. - last HD Sat. 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 Mild dyspnea but no cough/n/v.   Objective:   BP 118/77 (BP Location: Right Arm)   Pulse (!) 108   Temp 97.7 F (36.5 C)   Resp (!) 21   Ht 5\' 6"  (1.676 m)   Wt 68 kg   SpO2 98%   BMI 24.20 kg/m   Intake/Output Summary (Last 24 hours) at 12/25/2018 0855 Last data filed at 12/24/2018 1756 Gross per 24 hour  Intake -  Output 3000 ml  Net -3000 ml   Weight change: 1.1 kg  Physical Exam: General appearance: cooperative, no distress and confused but pleasant, comfortable Resp: rales bibasilar > on right base Cardio: S1, S2 normal and systolic murmur: systolic ejection 2/6, crescendo and decrescendo at 2nd left intercostal space GI: pos bs, soft, liver down 5 cm Extremities: aVF LUA .  ulcer 1 cm deep on L , R heel ischemic. legs hot to mid calf  Imaging: Dg Chest Port 1 View  Result Date: 12/23/2018 CLINICAL DATA:  Lethargy. Leukocytosis. Current history of hypertension, end-stage renal disease, CHF and atrial fibrillation. EXAM: PORTABLE CHEST 1 VIEW COMPARISON:  11/09/2018 and earlier. FINDINGS: Cardiac silhouette moderately enlarged, unchanged. Thoracic aorta atherosclerotic, unchanged. Airspace opacities throughout both lungs, asymmetric and increased in the RIGHT lung, new since the most recent prior examination 11/09/2018. IMPRESSION: Asymmetric airspace pulmonary edema versus  multifocal BILATERAL pneumonia, RIGHT greater than LEFT. Electronically Signed   By: Evangeline Dakin M.D.   On: 12/23/2018 12:00    Labs: BMET Recent Labs  Lab 12/22/18 12/23/18 1128 12/24/18 0227 12/25/18 0254  NA 144 143 143 140  K 3.8 3.9 3.8 3.7  CL  --  99 99 98  CO2  --  28 27 29   GLUCOSE  --  145* 109* 137*  BUN 65* 83* 48* 25*  CREATININE 7.4* 8.72* 5.30* 3.19*  CALCIUM  --  10.4* 9.8 9.2  PHOS  --   --  1.8* 1.7*   CBC Recent Labs  Lab 12/23/18 1128 12/24/18 0227 12/25/18 0254  WBC 17.9* 15.6* 12.5*  NEUTROABS 15.5* 13.0*  --   HGB 9.5* 10.5* 9.6*  HCT 31.7* 33.5* 31.2*  MCV 100.6* 99.4 101.0*  PLT 223 204 215    Medications:    . acetaminophen  1,000 mg Oral TID  . atorvastatin  20 mg Oral q1800  . calcium acetate  2,668 mg Oral TID WC  . chlorhexidine  15 mL Mouth Rinse BID  . Chlorhexidine Gluconate Cloth  6 each Topical Q0600  . Chlorhexidine Gluconate Cloth  6 each Topical Q0600  . [START ON 12/30/2018] darbepoetin (ARANESP) injection - DIALYSIS  150 mcg Intravenous Q Fri-HD  . doxercalciferol  2 mcg Intravenous Q M,W,F-HD  . erythromycin   Left Eye Q8H  . mouth rinse  15 mL Mouth Rinse  q12n4p  . metoprolol tartrate  25 mg Oral BID  . midodrine  10 mg Oral Q M,W,F-HD  . multivitamin  1 tablet Oral QHS  . pantoprazole  40 mg Oral BID  . phytonadione  2 mg Subcutaneous Once      Otelia Santee, MD 12/25/2018, 8:55 AM

## 2018-12-25 NOTE — Progress Notes (Signed)
ANTICOAGULATION CONSULT NOTE - Follow Up Consult  Pharmacy Consult for Coumadin Indication: atrial fibrillation  Allergies  Allergen Reactions  . Occlusive Silicone Sheets [Silicone]   . Other     Occlusive adhesive  . Tape Itching and Other (See Comments)    Cloth tape only    Patient Measurements: Height: 5\' 6"  (167.6 cm) Weight: 149 lb 14.6 oz (68 kg) IBW/kg (Calculated) : 63.8 Heparin Dosing Weight: n/a  Vital Signs: Temp: 97.1 F (36.2 C) (12/29 0337) Temp Source: Oral (12/29 0337) BP: 131/73 (12/29 0337) Pulse Rate: 108 (12/28 2243)  Labs: Recent Labs    12/23/18 1128 12/23/18 1630 12/24/18 0227 12/24/18 0925 12/25/18 0254  HGB 9.5*  --  10.5*  --  9.6*  HCT 31.7*  --  33.5*  --  31.2*  PLT 223  --  204  --  215  LABPROT  --  69.4*  --  55.8* 38.7*  INR  --  8.60*  --  6.48* 4.05*  CREATININE 8.72*  --  5.30*  --  3.19*    Estimated Creatinine Clearance: 17.2 mL/min (A) (by C-G formula based on SCr of 3.19 mg/dL (H)).   Medical History: Past Medical History:  Diagnosis Date  . A-fib ([place])   . Anemia   . Blood transfusion   . BPH (benign prostatic hyperplasia)   . CHF (congestive heart failure) ([place])   . Diarrhea   . DM (diabetes mellitus) ([place])   . ESRD on hemodialysis ([place])    Started dialysis in 2009  . History of GI bleed    secondary to coumadin  . HTN (hypertension)   . Hyperlipidemia   . OSA (obstructive sleep apnea)    uses CPAP  . Secondary hyperparathyroidism of renal origin ([place])     Medications:  Scheduled:  . acetaminophen  1,000 mg Oral TID  . atorvastatin  20 mg Oral q1800  . calcium acetate  2,668 mg Oral TID WC  . chlorhexidine  15 mL Mouth Rinse BID  . Chlorhexidine Gluconate Cloth  6 each Topical Q0600  . Chlorhexidine Gluconate Cloth  6 each Topical Q0600  . [START ON 12/30/2018] darbepoetin (ARANESP) injection - DIALYSIS  150 mcg Intravenous Q Fri-HD  . doxercalciferol  2 mcg Intravenous Q M,W,F-HD  . erythromycin    Left Eye Q8H  . mouth rinse  15 mL Mouth Rinse q12n4p  . methylPREDNISolone (SOLU-MEDROL) injection  80 mg Intravenous Q12H  . metoprolol tartrate  25 mg Oral BID  . midodrine  10 mg Oral Q M,W,F-HD  . multivitamin  1 tablet Oral QHS  . pantoprazole  40 mg Oral BID  . phytonadione  2 mg Subcutaneous Once    Assessment: 77 yo male admitted for weakness.  On Coumadin PTA for afib, per pt report.  PTA Coumadin dose on hold for high INR.  INR downtrending, 8.6>6.48>4.05.  Phytonadione ordered yesterday but was not given as patient was in dialysis. INR continues to downtrend, will not give Vit K per Dr. Wynelle Cleveland.  Hgb low, but relatively stable. No overt bleeding or complications noted by nurse.  *Warfarin PTA dose: 3 mg daily  Goal of Therapy:  INR 2-3 Monitor platelets by anticoagulation protocol: Yes   Plan:  Hold Coumadin tonight Daily INR/CBC.   Claiborne Billings, PharmD PGY2 Cardiology Pharmacy Resident Phone [phone] Please check AMION for all Pharmacist numbers by unit 12/25/2018 7:24 AM

## 2018-12-26 DIAGNOSIS — N186 End stage renal disease: Secondary | ICD-10-CM

## 2018-12-26 DIAGNOSIS — Z992 Dependence on renal dialysis: Secondary | ICD-10-CM

## 2018-12-26 DIAGNOSIS — J189 Pneumonia, unspecified organism: Secondary | ICD-10-CM

## 2018-12-26 DIAGNOSIS — Z7189 Other specified counseling: Secondary | ICD-10-CM

## 2018-12-26 DIAGNOSIS — A419 Sepsis, unspecified organism: Secondary | ICD-10-CM

## 2018-12-26 LAB — CBC
HCT: 32.3 % — ABNORMAL LOW (ref 39.0–52.0)
Hemoglobin: 9.7 g/dL — ABNORMAL LOW (ref 13.0–17.0)
MCH: 29.8 pg (ref 26.0–34.0)
MCHC: 30 g/dL (ref 30.0–36.0)
MCV: 99.1 fL (ref 80.0–100.0)
Platelets: 202 10*3/uL (ref 150–400)
RBC: 3.26 MIL/uL — ABNORMAL LOW (ref 4.22–5.81)
RDW: 17.3 % — ABNORMAL HIGH (ref 11.5–15.5)
WBC: 12.3 10*3/uL — ABNORMAL HIGH (ref 4.0–10.5)
nRBC: 0.2 % (ref 0.0–0.2)

## 2018-12-26 LAB — RENAL FUNCTION PANEL
Albumin: 2.4 g/dL — ABNORMAL LOW (ref 3.5–5.0)
Anion gap: 16 — ABNORMAL HIGH (ref 5–15)
BUN: 53 mg/dL — ABNORMAL HIGH (ref 8–23)
CO2: 24 mmol/L (ref 22–32)
Calcium: 9.1 mg/dL (ref 8.9–10.3)
Chloride: 95 mmol/L — ABNORMAL LOW (ref 98–111)
Creatinine, Ser: 4.95 mg/dL — ABNORMAL HIGH (ref 0.61–1.24)
GFR calc Af Amer: 12 mL/min — ABNORMAL LOW (ref >60)
GFR calc non Af Amer: 10 mL/min — ABNORMAL LOW (ref >60)
GLUCOSE: 182 mg/dL — AB (ref 70–99)
Phosphorus: 2.3 mg/dL — ABNORMAL LOW (ref 2.5–4.6)
Potassium: 4.1 mmol/L (ref 3.5–5.1)
Sodium: 135 mmol/L (ref 135–145)

## 2018-12-26 LAB — PROTIME-INR
INR: 3.97
PROTHROMBIN TIME: 38.2 s — AB (ref 11.4–15.2)

## 2018-12-26 NOTE — Progress Notes (Signed)
RT Note:  Walked into patient room and there was no CPAP.  Spoke with dayshift RN and she stated that patient is highly confused and has been pulling on things all day.  Mitts on patient at this time, but RN also stated that he had managed earlier to pull of mitts and pulled foams off his legs and was pulling on lines.  RN felt that patient would not keep CPAP on in his confused state.

## 2018-12-26 NOTE — Progress Notes (Signed)
ANTICOAGULATION CONSULT NOTE - Follow Up Consult  Pharmacy Consult for Coumadin Indication: atrial fibrillation  Allergies  Allergen Reactions  . Occlusive Silicone Sheets [Silicone]   . Other     Occlusive adhesive  . Tape Itching and Other (See Comments)    Cloth tape only    Patient Measurements: Height: 5\' 6"  (167.6 cm) Weight: 156 lb (70.8 kg) IBW/kg (Calculated) : 63.8 Heparin Dosing Weight: n/a  Vital Signs: Temp: 98.2 F (36.8 C) (12/30 0730) Temp Source: Oral (12/30 0730) BP: 112/63 (12/30 0852) Pulse Rate: 102 (12/30 0852)  Labs: Recent Labs    12/24/18 0227 12/24/18 0925 12/25/18 0254 12/26/18 0243  HGB 10.5*  --  9.6* 9.7*  HCT 33.5*  --  31.2* 32.3*  PLT 204  --  215 202  LABPROT  --  55.8* 38.7* 38.2*  INR  --  6.48* 4.05* 3.97  CREATININE 5.30*  --  3.19* 4.95*    Estimated Creatinine Clearance: 11.1 mL/min (A) (by C-G formula based on SCr of 4.95 mg/dL (H)).   Medical History: Past Medical History:  Diagnosis Date  . A-fib ([place])   . Anemia   . Blood transfusion   . BPH (benign prostatic hyperplasia)   . CHF (congestive heart failure) ([place])   . Diarrhea   . DM (diabetes mellitus) ([place])   . ESRD on hemodialysis ([place])    Started dialysis in 2009  . History of GI bleed    secondary to coumadin  . HTN (hypertension)   . Hyperlipidemia   . OSA (obstructive sleep apnea)    uses CPAP  . Secondary hyperparathyroidism of renal origin ([place])     Medications:  Scheduled:  . acetaminophen  1,000 mg Oral TID  . atorvastatin  20 mg Oral q1800  . calcium acetate  667 mg Oral TID WC  . chlorhexidine  15 mL Mouth Rinse BID  . Chlorhexidine Gluconate Cloth  6 each Topical Q0600  . Chlorhexidine Gluconate Cloth  6 each Topical Q0600  . [START ON 12/30/2018] darbepoetin (ARANESP) injection - DIALYSIS  150 mcg Intravenous Q Fri-HD  . doxercalciferol  2 mcg Intravenous Q M,W,F-HD  . erythromycin   Left Eye Q8H  . hydrocerin   Topical BID  . mouth  rinse  15 mL Mouth Rinse q12n4p  . metoprolol tartrate  25 mg Oral BID  . midodrine  10 mg Oral Q M,W,F-HD  . multivitamin  1 tablet Oral QHS  . pantoprazole  40 mg Oral BID    Assessment: 77 yo male admitted for weakness.  On Coumadin PTA for afib, per pt report.  PTA Coumadin dose on hold for high INR.  INR downtrending, 8.6>6.48>4.05 > 3.97. Noted Vit K ordered for 12/28 not given as the patient was in HD - MD aware and holding off on additional doses for now. CBC stable - no bleeding noted.   *Warfarin PTA dose: 3 mg daily  Goal of Therapy:  INR 2-3 Monitor platelets by anticoagulation protocol: Yes   Plan:  Hold Coumadin again tonight Daily INR/CBC Will continue to monitor for any signs/symptoms of bleeding and will follow up with PT/INR in the a.m.   Thank you for allowing pharmacy to be a part of this patient's care.  Alycia Rossetti, PharmD, BCPS Clinical Pharmacist Pager: [phone] Clinical phone for 12/26/2018 from 7a-3:30p: [phone] If after 3:30p, please call main pharmacy at: [phone] Please check AMION for all [place] numbers 12/26/2018 9:56 AM

## 2018-12-26 NOTE — Progress Notes (Signed)
Daily Progress Note   Patient Name: [name]       Date: 12/26/2018 DOB: 1941-08-11  Age: 77 y.o. MRN#: [id number] Attending Physician: Debbe Odea, MD Primary Care Physician: Hennie Duos, MD Admit Date: 12/23/2018  Reason for Consultation/Follow-up: Establishing goals of care  Subjective:  Patient lethargic this morning. Will open eyes to voice but falls quickly back to sleep. Not following commands. Received prn oxycodone dose this AM.   Spoke with daughter, [name] via telephone. She is unable to meet with me today but we plan to meet for [place] discussion tomorrow morning, 12/31 at 10am. Introduced role of palliative medicine and briefly discussed diagnoses and interventions. Answered questions and concerns and again further plan to discuss in AM.   Length of Stay: 3  Current Medications: Scheduled Meds:  . acetaminophen  1,000 mg Oral TID  . atorvastatin  20 mg Oral q1800  . calcium acetate  667 mg Oral TID WC  . chlorhexidine  15 mL Mouth Rinse BID  . Chlorhexidine Gluconate Cloth  6 each Topical Q0600  . Chlorhexidine Gluconate Cloth  6 each Topical Q0600  . [START ON 12/30/2018] darbepoetin (ARANESP) injection - DIALYSIS  150 mcg Intravenous Q Fri-HD  . doxercalciferol  2 mcg Intravenous Q M,W,F-HD  . erythromycin   Left Eye Q8H  . hydrocerin   Topical BID  . mouth rinse  15 mL Mouth Rinse q12n4p  . metoprolol tartrate  25 mg Oral BID  . midodrine  10 mg Oral Q M,W,F-HD  . multivitamin  1 tablet Oral QHS  . pantoprazole  40 mg Oral BID    Continuous Infusions: . ceFEPime (MAXIPIME) IV 1 g (12/25/18 1324)  . vancomycin      PRN Meds: acetaminophen **OR** acetaminophen, calcium carbonate (dosed in mg elemental calcium), camphor-menthol **AND** hydrOXYzine,  docusate sodium, feeding supplement (NEPRO CARB STEADY), ondansetron **OR** ondansetron (ZOFRAN) IV, oxyCODONE, sorbitol, zolpidem  Physical Exam Vitals signs and nursing note reviewed.  Constitutional:      Appearance: He is ill-appearing.  HENT:     Head: Normocephalic and atraumatic.  Cardiovascular:     Rate and Rhythm: Normal rate.  Pulmonary:     Effort: No tachypnea, accessory muscle usage or respiratory distress.  Abdominal:     Tenderness: There is no abdominal tenderness.  Feet:     Comments: Bilateral heel wounds. Dressings C/D/I Neurological:     Mental Status: He is lethargic.     Comments: Wakes to voice but remains drowsy and unable to participate in conversation.   Psychiatric:        Attention and Perception: He is inattentive.        Speech: Speech is delayed.        Cognition and Memory: Cognition is impaired.            Vital Signs: BP 112/63   Pulse (!) 102   Temp 98.2 F (36.8 C) (Oral)   Resp 18   Ht 5\' 6"  (1.676 m)   Wt 70.8 kg   SpO2 100%   BMI 25.18 kg/m  SpO2: SpO2: 100 % O2 Device: O2 Device: Nasal Cannula O2 Flow Rate: O2 Flow Rate (L/min): 2 L/min  Intake/output summary:   Intake/Output Summary (Last 24 hours) at 12/26/2018 1030 Last data filed at 12/26/2018 0400 Gross per 24 hour  Intake 1166.64 ml  Output 0 ml  Net 1166.64 ml   LBM: Last BM Date: 12/25/18 Baseline Weight: Weight: 68.9 kg Most recent weight: Weight: 70.8 kg       Palliative Assessment/Data: PPS 30%   Flowsheet Rows     Most Recent Value  Intake Tab  Referral Department  Hospitalist  Unit at Time of Referral  Intermediate Care Unit  Date Notified  12/24/18  Palliative Care Type  Return patient Palliative Care  Reason for referral  Clarify Goals of Care  Date of Admission  12/23/18  Date first seen by Palliative Care  12/25/18  # of days Palliative referral response time  1 Day(s)  # of days IP prior to Palliative referral  1  Clinical Assessment    Psychosocial & Spiritual Assessment  Palliative Care Outcomes      Patient Active Problem List   Diagnosis Date Noted  . Palliative care encounter   . OSA (obstructive sleep apnea)   . HTN (hypertension)   . ESRD on hemodialysis ([place])   . BPH (benign prostatic hyperplasia)   . Hypotension 12/07/2018  . GERD (gastroesophageal reflux disease) 12/07/2018  . Pressure injury of skin 12/03/2018  . Symptomatic anemia 12/02/2018  . Acute on chronic blood loss anemia 12/02/2018  . 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 Lower GI bleeding 12/02/2018  . Heme positive stool   . Acute on chronic diastolic (congestive) heart failure ([place]) 11/19/2018  . Acute CVA (cerebrovascular accident) ([place]) 11/19/2018  . Hypoglycemia 11/19/2018  . Peripheral vascular disease due to secondary diabetes ([place]) 11/19/2018  . Dry gangrene ([place]) 11/19/2018  . 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 DNR (do not resuscitate) discussion   . Goals of care, counseling/discussion   . Palliative care by specialist   . Acute hypoxemic respiratory failure ([place]) 11/06/2018  . Volume overload 11/06/2018  . Multifocal pneumonia 11/06/2018  . Acute metabolic encephalopathy 96/22/2979  . Physical deconditioning 11/06/2018  . Acute respiratory failure with hypoxia and hypercapnia ([place]) 11/04/2018  . CAP (community acquired pneumonia) 11/04/2018  . Pleural effusion, right 11/04/2018  . Increased ammonia level 11/04/2018  . Abnormal CT of the abdomen 11/04/2018  . Atrial fibrillation with RVR ([place]) 11/04/2018  . Sepsis ([place]) 10/21/2018  . Acute encephalopathy 10/21/2018  . Elevated alkaline phosphatase level 10/21/2018  . Chronic anemia 10/21/2018  . Thrombocytopenia ([place]) 10/21/2018  . Pulmonary HTN ([place]) 03/04/2017  . Aortic valve stenosis  03/04/2017  . Hypertension, accelerated with heart disease, without CHF 01/12/2017  . Dyspnea on exertion 01/12/2017  . Hyperlipidemia  04/24/2011  . HYPERTENSION, BENIGN 10/24/2009  . DM (diabetes mellitus) ([place]) 10/23/2009  . OBSTRUCTIVE SLEEP APNEA 10/23/2009  . PAF (paroxysmal atrial fibrillation) ([place]) 10/23/2009  . CHF (congestive heart failure) ([place]) 10/23/2009    Palliative Care Assessment & Plan   Patient Profile: Palliative Care consult requested for this 77 y.o. male with multiple medical problems including ESRD on HD, OSA on CPAP, DM, diastolic dysfunction with history of CHF, A. fib on Coumadin, PVD with recent left fifth ray amputation, who was recently hospitalized 11/06/2018 to 11/17/2018 with acute CVA and hypoxic respiratory failure from volume overload and was readmitted 12/01/2018 to 12/04/2018 with symptomatic anemia thought likely due to lower GI bleed.  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  He is now readmitted from [facility] facility on 12/23/2018 with weakness, leukocytosis, and shortness of breath.  He was found to have multifocal pneumonia versus infected postop foot wound.  Vascular surgery has consulted and feels that patient would likely need bilateral lower extremity amputation.  Palliative care was consulted to help address goals.  Assessment: Leukocytosis ? HCAP versus infected diabetic foot wound PVD ESRD on hemodialysis PAF OSA  Recommendations/Plan:  Continue current plan of care and medical management.   Pending GOC discussion. PMT NP to meet with daughter, [name], 12/31 at 10am.    Code Status: DNR   Code Status Orders  (From admission, onward)         Start     Ordered   12/23/18 1417  Do not attempt resuscitation (DNR)  Continuous    Question Answer Comment  In the event of cardiac or respiratory ARREST Do not call a "code blue"   In the event of cardiac or respiratory ARREST Do not perform Intubation, CPR, defibrillation or ACLS   In the event of cardiac or respiratory ARREST Use medication by any route,  position, wound care, and other measures to relive pain and suffering. May use oxygen, suction and manual treatment of airway obstruction as needed for comfort.      12/23/18 1421        Code Status History    Date Active Date Inactive Code Status Order ID Comments User Context   12/02/2018 0859 12/04/2018 1641 DNR [id number]  Lady Deutscher, MD ED   11/10/2018 1601 11/17/2018 1514 DNR [id number]  Drue Novel, NP Inpatient   11/06/2018 2126 11/10/2018 1601 Full Code [id number]  Shela Leff, MD ED   10/21/2018 0409 11/02/2018 2156 Full Code [id number]  Shela Leff, MD Inpatient       Prognosis:   Unable to determine  Discharge Planning:  To Be Determined  Care plan was discussed with patient, RN, daughter, Dr. Wynelle Cleveland  Thank you for allowing the Palliative Medicine Team to assist in the care of this patient.   Time In: 1000 Time Out: 1030 Total Time 30 Prolonged Time Billed no      Greater than 50%  of this time was spent counseling and coordinating care related to the above assessment and plan.  Ihor Dow, FNP-C Palliative Medicine Team  Phone: [phone] Fax: [phone]  Please contact Palliative Medicine Team phone at [phone] for questions and concerns.

## 2018-12-26 NOTE — Progress Notes (Signed)
[facility] Progress Note    AF MWF  4hr lt AVF Hect 47mcg IV qtx Mircera 200 mcg q2weeks (last 12/18/18) No heparin Assessment/ Plan:   1 ESRD better, still vol xs. - last HD Sat. - Next HD later today  2 Fever legs probable source On Ab 3 Anemia better with hemoconc 4 HPTH vi tD - decr phoslo from 3 tabs to 1 tab TIDM on 12/29 5 PVD primary issue - Agree with palliative consult; if he ends up with amputations his QOL will be severely compromised + ESRD TIW HD - Palliative trying to get ahold of family to counsel; will withdraw HD if family decides on  comfort care. I think this is a reasonable direction given the QOL issues with b/l amputations.   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 Mild dyspnea but no cough/n/v.   Objective:   BP 122/85 (BP Location: Right Arm)   Pulse 100   Temp 98.2 F (36.8 C) (Oral)   Resp 18   Ht 5\' 6"  (1.676 m)   Wt 70.8 kg   SpO2 100%   BMI 25.18 kg/m   Intake/Output Summary (Last 24 hours) at 12/26/2018 0853 Last data filed at 12/26/2018 0400 Gross per 24 hour  Intake 1166.64 ml  Output 0 ml  Net 1166.64 ml   Weight change: 0.761 kg  Physical Exam: General appearance:cooperative, no distress andconfused but pleasant, comfortable Resp:ralesbibasilar > on right base Cardio:S1, S2 normal and systolic murmur:systolic MLJQGBEE1/0,OFHQRFXJO and decrescendoat 2nd left intercostal space GI:pos bs, soft, liver down 5 cm Extremities:aVF LUA . ulcer 1 cm deep on L , R heel ischemic.   Imaging: Dg Chest 2 View  Result Date: 12/25/2018 CLINICAL DATA:  Patient with history of pneumonia. EXAM: CHEST - 2 VIEW COMPARISON:  Chest radiograph 12/23/2018 FINDINGS: Monitoring leads overlie the patient. Stable cardiomegaly. Aortic atherosclerosis. Slight interval improvement in previously described bilateral airspace opacities. Probable small  bilateral pleural effusions. No pneumothorax. IMPRESSION: Slight interval improvement bilateral airspace opacities may represent improving edema. Underlying infection not excluded. Electronically Signed   By: Lovey Newcomer M.D.   On: 12/25/2018 09:56    Labs: BMET Recent Labs  Lab 12/22/18 12/23/18 1128 12/24/18 0227 12/25/18 0254 12/26/18 0243  NA 144 143 143 140 135  K 3.8 3.9 3.8 3.7 4.1  CL  --  99 99 98 95*  CO2  --  28 27 29 24   GLUCOSE  --  145* 109* 137* 182*  BUN 65* 83* 48* 25* 53*  CREATININE 7.4* 8.72* 5.30* 3.19* 4.95*  CALCIUM  --  10.4* 9.8 9.2 9.1  PHOS  --   --  1.8* 1.7* 2.3*   CBC Recent Labs  Lab 12/23/18 1128 12/24/18 0227 12/25/18 0254 12/26/18 0243  WBC 17.9* 15.6* 12.5* 12.3*  NEUTROABS 15.5* 13.0*  --   --   HGB 9.5* 10.5* 9.6* 9.7*  HCT 31.7* 33.5* 31.2* 32.3*  MCV 100.6* 99.4 101.0* 99.1  PLT 223 204 215 202    Medications:    . acetaminophen  1,000 mg Oral TID  . atorvastatin  20 mg Oral q1800  . calcium acetate  667 mg Oral TID WC  . chlorhexidine  15 mL Mouth Rinse BID  . Chlorhexidine Gluconate Cloth  6 each Topical Q0600  . Chlorhexidine Gluconate Cloth  6 each Topical Q0600  . [START ON 12/30/2018] darbepoetin (ARANESP) injection -  DIALYSIS  150 mcg Intravenous Q Fri-HD  . doxercalciferol  2 mcg Intravenous Q M,W,F-HD  . erythromycin   Left Eye Q8H  . hydrocerin   Topical BID  . mouth rinse  15 mL Mouth Rinse q12n4p  . metoprolol tartrate  25 mg Oral BID  . midodrine  10 mg Oral Q M,W,F-HD  . multivitamin  1 tablet Oral QHS  . pantoprazole  40 mg Oral BID      Otelia Santee, MD 12/26/2018, 8:53 AM

## 2018-12-26 NOTE — Progress Notes (Signed)
Pharmacy Antibiotic Note  [name] is a 77 y.o. male admitted on 12/23/2018 with pneumonia.  Pharmacy has been consulted for cefepime and vancomycin dosing.  ESRD-HD usually MWF, last HD 12/24.  The patient was noted to dialyze late on Fri, 12/27 (on schedule) and received an extra HD session on Sat, 12/28 (off-schedule). Vancomycin maintenance doses were given for these sessions. Next HD planned for today. Noted palliative discussions underway.   Plan: - Cont Vancomycin 750 mg/HD-MWF - Cont Cefepime 1g IV every 24 hours - Will f/u palliative discussions/plans - Will continue to follow HD schedule/duration, culture results, LOT, and antibiotic de-escalation plans   Height: 5\' 6"  (167.6 cm) Weight: 156 lb (70.8 kg) IBW/kg (Calculated) : 63.8  Temp (24hrs), Avg:98 F (36.7 C), Min:97.7 F (36.5 C), Max:98.2 F (36.8 C)  Recent Labs  Lab 12/22/18 12/23/18 1128 12/23/18 1148 12/24/18 0227 12/25/18 0254 12/26/18 0243  WBC  --  17.9*  --  15.6* 12.5* 12.3*  CREATININE 7.4* 8.72*  --  5.30* 3.19* 4.95*  LATICACIDVEN  --   --  1.82  --   --   --     Estimated Creatinine Clearance: 11.1 mL/min (A) (by C-G formula based on SCr of 4.95 mg/dL (H)).    Allergies  Allergen Reactions  . Occlusive Silicone Sheets [Silicone]   . Other     Occlusive adhesive  . Tape Itching and Other (See Comments)    Cloth tape only    Antimicrobials this admission: Vanc 12/27>> Cefepime 12/27>> LVQ 12/29 x 1  Dose adjustments this admission: n/a  Microbiology results: 12/27 MRSA PCR >> neg 12/27 BCx >> ngtd  Thank you for allowing pharmacy to be a part of this patient's care.  Alycia Rossetti, PharmD, BCPS Clinical Pharmacist Pager: [phone] Clinical phone for 12/26/2018 from 7a-3:30p: [phone] If after 3:30p, please call main pharmacy at: [phone] Please check AMION for all [place] numbers 12/26/2018 9:54 AM

## 2018-12-26 NOTE — Progress Notes (Signed)
Spoke with daughter [name] requesting to speak with palliative needing to change time of appointment for tomorrow. Text paged palliative and confirmed ok to change appointment to 10 am.

## 2018-12-26 NOTE — Consult Note (Signed)
[facility] Nurse wound consult note Second consult received today for assessment and recommendations for care of bilateral foot wounds.  Patient was seen yesterday for these wounds. See note from that encounter and orders for wound care.  [facility] nursing team will not follow, but will remain available to this patient, the nursing and medical teams.  Please re-consult if needed. Thanks, Maudie Flakes, MSN, RN, [place], Arther Abbott  Pager# [phone]

## 2018-12-26 NOTE — Progress Notes (Signed)
PROGRESS NOTE    KEYSHAUN [name]   DXA:[id number]  DOB: 1941-03-15  DOA: 12/23/2018 PCP: Edward Duos, MD   Brief Narrative:    [name] is a 77 y.o. male with medical history significant for dementia, ESRD on HD; DM; OSA on CPAP; HTN; HLD; CHF; PVD with L 5th ray amputation on 12/10; and afib on Coumadin presenting with weakness. He has dementia and is unable to give history.  Sent from [facility] for subjective fevers, weakness. He underwent left fifth toe amputation on 12/10 secondary to gangrene. The staff spoke with Vascular surgery and was recommended to send patient to ED. He had received a dose of IM Ceftraixone the day before.  EMS reports the patient was also noted to be short of breath on arrival. He was on 3L [place]. In ED, diagnosed with multifocal pneumonia and started on Vancomycin, Levaquin and Cefepime. In ED > ED, WBC 17.9, T-99 rectal, BP 115/63, HR 103. CXR shows Asymmetric airspace pulmonary edema versus multifocal bilateral pneumonia, R>L. BC X 2 ordered.  Was dialyzed overnight as he was volume overloaded per nephro.  Subjective: Asleep and hard to awaken. Received home Oxy early this AM    Assessment & Plan:   Principal Problem:   Subjective fevers, leukocytosis, acute respiratory failure, tachycardia - ?HCAP vs infected post op foot wound (foul smelling with discharge) suspected as a source of infection -   blood  Cultures neg - he was dialyzed x 2 in the first 24 hrs to remove excess fluid but continues to have b/l infiltrates on CXR- continues to have coarse RLL crackles-  Cont treatment for HCAP and gangrenous foot wound with Vanc and Cefepime - repeat CXR after dialysis today- Pulse ox now 100% on room air-  Active Problems:  Diabetic wet gangrene of the foot, PVD - s/p left superficial femoral artery occlusion and posterior tibial angioplasty with Edward. Trula [name] on 11/15/2018 - 12/10 Ray amputation left fifth toe- Edward [name]  - Vascular surgery  recommends palliative care vs b/l amputations - palliative care consulted on 12/28- daughter will not be able to come to the hospital until tomorrow to speak with them - WOC has made recommendations    ESRD on hemodialysis / fluid overloaded - d/w [name] who feels he was fluid overloaded on admission- back to back dialysis x 2 done - on Midodrine as outpt on dialysis    PAF (paroxysmal atrial fibrillation)/ coagulopathy - INR ~8 on admission- steadily improving - - hold coumadin - Lopressor resumed- HR much better controlled today    Dementia with possible acute encephalopathy  - baseline unknown- seems improved from admission    OBSTRUCTIVE SLEEP APNEA - CPAP ordered  Conjuctivitis -Treated on 12/24 with garamycin drops -Appears to only be present on the left at this time -cont erythromycin ophthalmic ointment  GOC> ed- [name] knows the patient very well and has recommended palliative care whom I have consulted  DVT prophylaxis: INR elevated Code Status: DNR Disposition: transfer to telemetry Family Communication: Consultants:   Nephrology  Vascular surgery Procedures:   none Antimicrobials:  Anti-infectives (From admission, onward)   Start     Dose/Rate Route Frequency Ordered Stop   12/26/18 1200  vancomycin (VANCOCIN) IVPB 750 mg/150 ml premix     750 mg 150 mL/hr over 60 Minutes Intravenous Every M-W-F (Hemodialysis) 12/23/18 1231     12/25/18 1800  levofloxacin (LEVAQUIN) IVPB 500 mg  Status:  Discontinued  500 mg 100 mL/hr over 60 Minutes Intravenous Every 48 hours 12/23/18 1805 12/25/18 1116   12/25/18 0745  vancomycin (VANCOCIN) IVPB 750 mg/150 ml premix     750 mg 150 mL/hr over 60 Minutes Intravenous  Once 12/25/18 0723 12/25/18 0953   12/24/18 1300  ceFEPIme (MAXIPIME) 1 g in sodium chloride 0.9 % 100 mL IVPB     1 g 200 mL/hr over 30 Minutes Intravenous Every 24 hours 12/23/18 1230     12/24/18 0200  vancomycin (VANCOCIN) IVPB 750  mg/150 ml premix     750 mg 150 mL/hr over 60 Minutes Intravenous  Once 12/23/18 2049 12/24/18 0150   12/23/18 1815  levofloxacin (LEVAQUIN) IVPB 750 mg  Status:  Discontinued     750 mg 100 mL/hr over 90 Minutes Intravenous  Once 12/23/18 1805 12/25/18 1116   12/23/18 1245  vancomycin (VANCOCIN) 1,500 mg in sodium chloride 0.9 % 500 mL IVPB     1,500 mg 250 mL/hr over 120 Minutes Intravenous  Once 12/23/18 1230 12/23/18 1536   12/23/18 1245  ceFEPIme (MAXIPIME) 2 g in sodium chloride 0.9 % 100 mL IVPB     2 g 200 mL/hr over 30 Minutes Intravenous  Once 12/23/18 1230 12/23/18 1325       Objective: Vitals:   12/26/18 0320 12/26/18 0730 12/26/18 0852 12/26/18 1119  BP: 120/84 122/85 112/63 102/71  Pulse: 100  (!) 102 100  Resp: 19 18  (!) 22  Temp: 98.1 F (36.7 C) 98.2 F (36.8 C)    TempSrc: Oral Oral    SpO2: 100%   94%  Weight: 70.8 kg     Height:        Intake/Output Summary (Last 24 hours) at 12/26/2018 1153 Last data filed at 12/26/2018 0400 Gross per 24 hour  Intake 1166.64 ml  Output 0 ml  Net 1166.64 ml   Filed Weights   12/24/18 1350 12/24/18 1756 12/26/18 0320  Weight: 70 kg 68 kg 70.8 kg    Examination: General exam: Appears comfortable  HEENT: PERRL, mouth dry Respiratory system: continues to have CTA - pulse ox 100% Cardiovascular system: S1 & S2 heard, IIRR,  2/6 murmur at LUS border Gastrointestinal system: Abdomen soft, non-tender, nondistended. Normal bowel sounds. Central nervous system: Alert-disoriented to time - No focal neurological deficits. Skin: very foul smelling wound left foot   Psychiatry:  Mood  appropriate.       Data Reviewed: I have personally reviewed following labs and imaging studies  CBC: Recent Labs  Lab 12/23/18 1128 12/24/18 0227 12/25/18 0254 12/26/18 0243  WBC 17.9* 15.6* 12.5* 12.3*  NEUTROABS 15.5* 13.0*  --   --   HGB 9.5* 10.5* 9.6* 9.7*  HCT 31.7* 33.5* 31.2* 32.3*  MCV 100.6* 99.4 101.0* 99.1  PLT  223 204 215 017   Basic Metabolic Panel: Recent Labs  Lab 12/22/18 12/23/18 1128 12/24/18 0227 12/25/18 0254 12/26/18 0243  NA 144 143 143 140 135  K 3.8 3.9 3.8 3.7 4.1  CL  --  99 99 98 95*  CO2  --  28 27 29 24   GLUCOSE  --  145* 109* 137* 182*  BUN 65* 83* 48* 25* 53*  CREATININE 7.4* 8.72* 5.30* 3.19* 4.95*  CALCIUM  --  10.4* 9.8 9.2 9.1  PHOS  --   --  1.8* 1.7* 2.3*   GFR: Estimated Creatinine Clearance: 11.1 mL/min (A) (by C-G formula based on SCr of 4.95 mg/dL (H)). Liver Function Tests: Recent  Labs  Lab 12/23/18 1128 12/24/18 0227 12/25/18 0254 12/26/18 0243  AST 26  --   --   --   ALT 20  --   --   --   ALKPHOS 290*  --   --   --   BILITOT 0.9  --   --   --   PROT 7.0  --   --   --   ALBUMIN 2.4* 2.5* 2.3* 2.4*   No results for input(s): LIPASE, AMYLASE in the last 168 hours. No results for input(s): AMMONIA in the last 168 hours. Coagulation Profile: Recent Labs  Lab 12/23/18 1630 12/24/18 0925 12/25/18 0254 12/26/18 0243  INR 8.60* 6.48* 4.05* 3.97   Cardiac Enzymes: No results for input(s): CKTOTAL, CKMB, CKMBINDEX, TROPONINI in the last 168 hours. BNP (last 3 results) No results for input(s): PROBNP in the last 8760 hours. HbA1C: No results for input(s): HGBA1C in the last 72 hours. CBG: No results for input(s): GLUCAP in the last 168 hours. Lipid Profile: No results for input(s): CHOL, HDL, LDLCALC, TRIG, CHOLHDL, LDLDIRECT in the last 72 hours. Thyroid Function Tests: No results for input(s): TSH, T4TOTAL, FREET4, T3FREE, THYROIDAB in the last 72 hours. Anemia Panel: No results for input(s): VITAMINB12, FOLATE, FERRITIN, TIBC, IRON, RETICCTPCT in the last 72 hours. Urine analysis:    Component Value Date/Time   COLORURINE YELLOW 10/20/2008 [place] 10/20/2008 0942   LABSPEC 1.011 10/20/2008 0942   PHURINE 6.0 10/20/2008 0942   GLUCOSEU NEGATIVE 10/20/2008 0942   HGBUR LARGE (A) 10/20/2008 0942   BILIRUBINUR  NEGATIVE 10/20/2008 0942   KETONESUR NEGATIVE 10/20/2008 0942   PROTEINUR 100 (A) 10/20/2008 0942   UROBILINOGEN 1.0 10/20/2008 0942   NITRITE NEGATIVE 10/20/2008 0942   LEUKOCYTESUR SMALL (A) 10/20/2008 0942   Sepsis Labs: @LABRCNTIP (procalcitonin:4,lacticidven:4) ) Recent Results (from the past 240 hour(s))  Blood Culture (routine x 2)     Status: None (Preliminary result)   Collection Time: 12/23/18 11:38 AM  Result Value Ref Range Status   Specimen Description BLOOD RIGHT ANTECUBITAL  Final   Special Requests   Final    BOTTLES DRAWN AEROBIC AND ANAEROBIC Blood Culture adequate volume   Culture   Final    NO GROWTH 2 DAYS Performed at [facility], [place] [address], [place], [place] [address]    Report Status PENDING  Incomplete  Blood Culture (routine x 2)     Status: None (Preliminary result)   Collection Time: 12/23/18 12:45 PM  Result Value Ref Range Status   Specimen Description BLOOD RIGHT ANTECUBITAL  Final   Special Requests AEROBIC BOTTLE ONLY Blood Culture adequate volume  Final   Culture   Final    NO GROWTH 2 DAYS Performed at [facility], [place] [address] [place], [place] [address]    Report Status PENDING  Incomplete  MRSA PCR Screening     Status: None   Collection Time: 12/23/18  5:08 PM  Result Value Ref Range Status   MRSA by PCR NEGATIVE NEGATIVE Final    Comment:        The GeneXpert MRSA Assay (FDA approved for NASAL specimens only), is one component of a comprehensive MRSA colonization surveillance program. It is not intended to diagnose MRSA infection nor to guide or monitor treatment for MRSA infections. Performed at [facility], [place] [address], [place], [place] [address]          Radiology Studies: Dg Chest 2  View  Result Date: 12/25/2018 CLINICAL DATA:  Patient with history of pneumonia. EXAM: CHEST - 2 VIEW COMPARISON:  Chest radiograph 12/23/2018 FINDINGS: Monitoring leads overlie the patient. Stable  cardiomegaly. Aortic atherosclerosis. Slight interval improvement in previously described bilateral airspace opacities. Probable small bilateral pleural effusions. No pneumothorax. IMPRESSION: Slight interval improvement bilateral airspace opacities may represent improving edema. Underlying infection not excluded. Electronically Signed   By: Lovey Newcomer M.D.   On: 12/25/2018 09:56      Scheduled Meds: . acetaminophen  1,000 mg Oral TID  . atorvastatin  20 mg Oral q1800  . calcium acetate  667 mg Oral TID WC  . chlorhexidine  15 mL Mouth Rinse BID  . Chlorhexidine Gluconate Cloth  6 each Topical Q0600  . Chlorhexidine Gluconate Cloth  6 each Topical Q0600  . [START ON 12/30/2018] darbepoetin (ARANESP) injection - DIALYSIS  150 mcg Intravenous Q Fri-HD  . doxercalciferol  2 mcg Intravenous Q M,W,F-HD  . erythromycin   Left Eye Q8H  . hydrocerin   Topical BID  . mouth rinse  15 mL Mouth Rinse q12n4p  . metoprolol tartrate  25 mg Oral BID  . midodrine  10 mg Oral Q M,W,F-HD  . multivitamin  1 tablet Oral QHS  . pantoprazole  40 mg Oral BID   Continuous Infusions: . ceFEPime (MAXIPIME) IV 1 g (12/25/18 1324)  . vancomycin       LOS: 3 days    Time spent in minutes: 35 min    Debbe Odea, MD [facility] Pager: [email or web address] Password [email or web address] 12/26/2018, 11:53 AM

## 2018-12-26 NOTE — Care Management Important Message (Signed)
Important Message  Patient Details  Name: [name] MRN: [id number] Date of Birth: 05-Feb-1941   Medicare Important Message Given:  Yes Due to illness, patient unable to sign   Delorse Lek 12/26/2018, 3:19 PM

## 2018-12-27 ENCOUNTER — Inpatient Hospital Stay (HOSPITAL_COMMUNITY): Payer: Medicare Other

## 2018-12-27 LAB — RENAL FUNCTION PANEL
Albumin: 2.3 g/dL — ABNORMAL LOW (ref 3.5–5.0)
Anion gap: 13 (ref 5–15)
BUN: 27 mg/dL — AB (ref 8–23)
CO2: 26 mmol/L (ref 22–32)
Calcium: 8.8 mg/dL — ABNORMAL LOW (ref 8.9–10.3)
Chloride: 98 mmol/L (ref 98–111)
Creatinine, Ser: 3.13 mg/dL — ABNORMAL HIGH (ref 0.61–1.24)
GFR calc Af Amer: 21 mL/min — ABNORMAL LOW (ref >60)
GFR calc non Af Amer: 18 mL/min — ABNORMAL LOW (ref >60)
Glucose, Bld: 200 mg/dL — ABNORMAL HIGH (ref 70–99)
Phosphorus: 1.8 mg/dL — ABNORMAL LOW (ref 2.5–4.6)
Potassium: 4.5 mmol/L (ref 3.5–5.1)
Sodium: 137 mmol/L (ref 135–145)

## 2018-12-27 LAB — CBC
HCT: 32.5 % — ABNORMAL LOW (ref 39.0–52.0)
Hemoglobin: 10 g/dL — ABNORMAL LOW (ref 13.0–17.0)
MCH: 31.1 pg (ref 26.0–34.0)
MCHC: 30.8 g/dL (ref 30.0–36.0)
MCV: 100.9 fL — ABNORMAL HIGH (ref 80.0–100.0)
Platelets: 196 10*3/uL (ref 150–400)
RBC: 3.22 MIL/uL — ABNORMAL LOW (ref 4.22–5.81)
RDW: 17.7 % — ABNORMAL HIGH (ref 11.5–15.5)
WBC: 14.1 10*3/uL — ABNORMAL HIGH (ref 4.0–10.5)
nRBC: 0.3 % — ABNORMAL HIGH (ref 0.0–0.2)

## 2018-12-27 LAB — PROTIME-INR
INR: 3.13
Prothrombin Time: 31.8 seconds — ABNORMAL HIGH (ref 11.4–15.2)

## 2018-12-27 NOTE — Progress Notes (Signed)
[facility] Progress Note    AF MWF  4hr lt AVF Hect 64mcg IV qtx Mircera 200 mcg q2weeks (last 12/18/18) No heparin Assessment/ Plan:  1 ESRD better, still vol xs. - last HD Mon evening. - Next HD Wed  2 Fever legs probable source On Ab 3 Anemia better with hemoconc 4 HPTH vi tD - decr phoslo from 3 tabs to 1 tab TIDM on 12/29  - D/c the phoslo 12/31  5 PVD primary issue - Agree with palliative consult; if he ends up with amputations his QOL will be severely compromised + ESRD TIW HD - Palliative trying to get ahold of family to counsel; will withdraw HD if family decides on  comfort care. I think this is a reasonable direction given the QOL issues with b/l amputations.   6 Dementia aggravated by fever 7 Severe debill 8 DM controlled 9 CHF   Subjective:   Confused this AM and disoriented. He did not realize he had dialyze late yesterday evening.   +cough, NP  Denies f/c/n   Objective:   BP 92/70   Pulse 96   Temp 98.3 F (36.8 C) (Oral)   Resp 16   Ht 5\' 6"  (1.676 m)   Wt 69.9 kg   SpO2 92%   BMI 24.86 kg/m   Intake/Output Summary (Last 24 hours) at 12/27/2018 0743 Last data filed at 12/26/2018 1623 Gross per 24 hour  Intake 240 ml  Output 1500 ml  Net -1260 ml   Weight change: 0.454 kg  Physical Exam: General appearance:cooperative, no distress andconfusedbut pleasant,comfortable Resp:poor air movement Cardio:S1, S2 normal and systolic murmur:systolic [id number]/8 GI:pos bs, SNDNT Extremities:aVF LUA . ulcer 1 cm deep on L , R heel ischemic.   Imaging: Dg Chest 2 View  Result Date: 12/27/2018 CLINICAL DATA:  Sepsis. EXAM: CHEST - 2 VIEW COMPARISON:  12/25/2018. FINDINGS: Stable cardiomegaly. Improving bilateral pulmonary interstitial prominence consistent improving interstitial edema and/or pneumonitis. No pleural effusion or pneumothorax. No acute bony abnormality. IMPRESSION: 1.  Stable cardiomegaly. 2. Improving  bilateral from interstitial prominence consistent improving interstitial edema and/or pneumonitis. Electronically Signed   ByMarcello Moores  Register   On: 12/27/2018 07:35    Labs: BMET Recent Labs  Lab 12/22/18 12/23/18 1128 12/24/18 0227 12/25/18 0254 12/26/18 0243 12/27/18 0248  NA 144 143 143 140 135 137  K 3.8 3.9 3.8 3.7 4.1 4.5  CL  --  99 99 98 95* 98  CO2  --  28 27 29 24 26   GLUCOSE  --  145* 109* 137* 182* 200*  BUN 65* 83* 48* 25* 53* 27*  CREATININE 7.4* 8.72* 5.30* 3.19* 4.95* 3.13*  CALCIUM  --  10.4* 9.8 9.2 9.1 8.8*  PHOS  --   --  1.8* 1.7* 2.3* 1.8*   CBC Recent Labs  Lab 12/23/18 1128 12/24/18 0227 12/25/18 0254 12/26/18 0243 12/27/18 0248  WBC 17.9* 15.6* 12.5* 12.3* 14.1*  NEUTROABS 15.5* 13.0*  --   --   --   HGB 9.5* 10.5* 9.6* 9.7* 10.0*  HCT 31.7* 33.5* 31.2* 32.3* 32.5*  MCV 100.6* 99.4 101.0* 99.1 100.9*  PLT 223 204 215 202 196    Medications:    . acetaminophen  1,000 mg Oral TID  . atorvastatin  20 mg Oral q1800  . calcium acetate  667 mg Oral TID WC  . chlorhexidine  15 mL Mouth Rinse BID  . Chlorhexidine Gluconate Cloth  6 each Topical Q0600  . Chlorhexidine Gluconate Cloth  6 each Topical Q0600  . [START ON 12/30/2018] darbepoetin (ARANESP) injection - DIALYSIS  150 mcg Intravenous Q Fri-HD  . doxercalciferol  2 mcg Intravenous Q M,W,F-HD  . erythromycin   Left Eye Q8H  . hydrocerin   Topical BID  . mouth rinse  15 mL Mouth Rinse q12n4p  . metoprolol tartrate  25 mg Oral BID  . midodrine  10 mg Oral Q M,W,F-HD  . multivitamin  1 tablet Oral QHS  . pantoprazole  40 mg Oral BID      Otelia Santee, MD 12/27/2018, 7:43 AM

## 2018-12-27 NOTE — Progress Notes (Addendum)
PROGRESS NOTE    [name]   LZJ:[id number]  DOB: February 18, 1941  DOA: 12/23/2018 PCP: Hennie Duos, MD   Brief Narrative:    [name] is a 77 y.o. male with medical history significant for dementia, ESRD on HD; DM; OSA on CPAP; HTN; HLD; CHF; PVD with L 5th ray amputation on 12/10; and afib on Coumadin presenting with weakness. He has dementia and is unable to give history.  Sent from [place] for subjective fevers, weakness. He underwent left fifth toe amputation on 12/10 secondary to gangrene. The staff spoke with Vascular surgery and was recommended to send patient to ED. He had received a dose of IM Ceftraixone the day before.  EMS reports the patient was also noted to be short of breath on arrival. He was on 3L [place]. In ED, diagnosed with multifocal pneumonia and started on Vancomycin, Levaquin and Cefepime. In ED > ED, WBC 17.9, T-99 rectal, BP 115/63, HR 103. CXR shows Asymmetric airspace pulmonary edema versus multifocal bilateral pneumonia, R>L. BC X 2 ordered.  Was dialyzed overnight as he was volume overloaded per nephro.  Subjective: Awake. States both feet hurt a little.     Assessment & Plan:   Principal Problem:   Sepsis> Subjective fevers, leukocytosis, acute respiratory failure, tachycardia - WBC 17 on admission - ?HCAP vs infected post op foot wound (foul smelling with discharge) suspected as a source of infection -   blood  Cultures neg - he was dialyzed x 2 in the first 24 hrs to remove excess fluid but continues to have b/l infiltrates on CXR- continues to have coarse RLL crackles-  Cont treatment for HCAP and gangrenous foot wound with Vanc and Cefepime -   Pulse ox now 100% on room air- WBC count improving  Active Problems:  Diabetic wet gangrene of the foot, PVD - s/p left superficial femoral artery occlusion and posterior tibial angioplasty with Dr. Trula Slade on 11/15/2018 - 12/10 Ray amputation left fifth toe- dr Scot Dock  - Vascular surgery  recommends palliative care vs b/l amputations - palliative care consulted on 12/28- daughter will not be able to come to the hospital until tomorrow to speak with them - WOC has made recommendations   -after palliative care consult today,  the patient and his family would like to proceed with amputations- I have notified vascular surgery  ESRD on hemodialysis / fluid overloaded - d/w Dr Deterding who feels he was fluid overloaded on admission- back to back dialysis x 2 done - on Midodrine with  dialysis    PAF (paroxysmal atrial fibrillation)/ coagulopathy - INR ~8 on admission- steadily improving - - hold coumadin - Lopressor resumed- HR much better controlled     Dementia with possible acute encephalopathy  - baseline unknown- seems improved from admission but continues to have significant memory deficits    OBSTRUCTIVE SLEEP APNEA - CPAP ordered  Conjuctivitis -Treated on 12/24 with garamycin drops -Appears to only be present on the left at this time -cont erythromycin ophthalmic ointment  GERD? PUD? - on BID Protonix as outpt - continue      DVT prophylaxis: INR still elevated Code Status: DNR Disposition: transfer to telemetry Family Communication: Consultants:   Nephrology  Vascular surgery Procedures:   none Antimicrobials:  Anti-infectives (From admission, onward)   Start     Dose/Rate Route Frequency Ordered Stop   12/26/18 1200  vancomycin (VANCOCIN) IVPB 750 mg/150 ml premix     750 mg 150 mL/hr over  60 Minutes Intravenous Every M-W-F (Hemodialysis) 12/23/18 1231     12/25/18 1800  levofloxacin (LEVAQUIN) IVPB 500 mg  Status:  Discontinued     500 mg 100 mL/hr over 60 Minutes Intravenous Every 48 hours 12/23/18 1805 12/25/18 1116   12/25/18 0745  vancomycin (VANCOCIN) IVPB 750 mg/150 ml premix     750 mg 150 mL/hr over 60 Minutes Intravenous  Once 12/25/18 0723 12/25/18 0953   12/24/18 1300  ceFEPIme (MAXIPIME) 1 g in sodium chloride 0.9 % 100 mL IVPB      1 g 200 mL/hr over 30 Minutes Intravenous Every 24 hours 12/23/18 1230     12/24/18 0200  vancomycin (VANCOCIN) IVPB 750 mg/150 ml premix     750 mg 150 mL/hr over 60 Minutes Intravenous  Once 12/23/18 2049 12/24/18 0150   12/23/18 1815  levofloxacin (LEVAQUIN) IVPB 750 mg  Status:  Discontinued     750 mg 100 mL/hr over 90 Minutes Intravenous  Once 12/23/18 1805 12/25/18 1116   12/23/18 1245  vancomycin (VANCOCIN) 1,500 mg in sodium chloride 0.9 % 500 mL IVPB     1,500 mg 250 mL/hr over 120 Minutes Intravenous  Once 12/23/18 1230 12/23/18 1536   12/23/18 1245  ceFEPIme (MAXIPIME) 2 g in sodium chloride 0.9 % 100 mL IVPB     2 g 200 mL/hr over 30 Minutes Intravenous  Once 12/23/18 1230 12/23/18 1325       Objective: Vitals:   12/27/18 0400 12/27/18 0411 12/27/18 0800 12/27/18 1151  BP: 92/70   90/63  Pulse: 96  99   Resp: 16  20 16   Temp:  98.3 F (36.8 C)  97.9 F (36.6 C)  TempSrc:  Oral  Oral  SpO2: 92%  95%   Weight:      Height:        Intake/Output Summary (Last 24 hours) at 12/27/2018 1243 Last data filed at 12/27/2018 1030 Gross per 24 hour  Intake 480 ml  Output 1500 ml  Net -1020 ml   Filed Weights   12/26/18 0320 12/26/18 1210 12/26/18 1623  Weight: 70.8 kg 71.2 kg 69.9 kg    Examination: General exam: Appears comfortable  HEENT: PERRL, mouth dry Respiratory system:  CTA - pulse ox 100% Cardiovascular system: S1 & S2 heard, IIRR, HR in low 90-100-  2/6 murmur at LUS, RUS border and at apex Gastrointestinal system: Abdomen soft, non-tender, nondistended. Normal bowel sounds. Central nervous system: Alert-disoriented to time - No focal neurological deficits. Skin: very foul smelling wound left foot   Psychiatry:  Mood  appropriate.       Data Reviewed: I have personally reviewed following labs and imaging studies  CBC: Recent Labs  Lab 12/23/18 1128 12/24/18 0227 12/25/18 0254 12/26/18 0243 12/27/18 0248  WBC 17.9* 15.6* 12.5* 12.3*  14.1*  NEUTROABS 15.5* 13.0*  --   --   --   HGB 9.5* 10.5* 9.6* 9.7* 10.0*  HCT 31.7* 33.5* 31.2* 32.3* 32.5*  MCV 100.6* 99.4 101.0* 99.1 100.9*  PLT 223 204 215 202 456   Basic Metabolic Panel: Recent Labs  Lab 12/23/18 1128 12/24/18 0227 12/25/18 0254 12/26/18 0243 12/27/18 0248  NA 143 143 140 135 137  K 3.9 3.8 3.7 4.1 4.5  CL 99 99 98 95* 98  CO2 28 27 29 24 26   GLUCOSE 145* 109* 137* 182* 200*  BUN 83* 48* 25* 53* 27*  CREATININE 8.72* 5.30* 3.19* 4.95* 3.13*  CALCIUM 10.4* 9.8 9.2 9.1 8.8*  PHOS  --  1.8* 1.7* 2.3* 1.8*   GFR: Estimated Creatinine Clearance: 17.6 mL/min (A) (by C-G formula based on SCr of 3.13 mg/dL (H)). Liver Function Tests: Recent Labs  Lab 12/23/18 1128 12/24/18 0227 12/25/18 0254 12/26/18 0243 12/27/18 0248  AST 26  --   --   --   --   ALT 20  --   --   --   --   ALKPHOS 290*  --   --   --   --   BILITOT 0.9  --   --   --   --   PROT 7.0  --   --   --   --   ALBUMIN 2.4* 2.5* 2.3* 2.4* 2.3*   No results for input(s): LIPASE, AMYLASE in the last 168 hours. No results for input(s): AMMONIA in the last 168 hours. Coagulation Profile: Recent Labs  Lab 12/23/18 1630 12/24/18 0925 12/25/18 0254 12/26/18 0243 12/27/18 0248  INR 8.60* 6.48* 4.05* 3.97 3.13   Cardiac Enzymes: No results for input(s): CKTOTAL, CKMB, CKMBINDEX, TROPONINI in the last 168 hours. BNP (last 3 results) No results for input(s): PROBNP in the last 8760 hours. HbA1C: No results for input(s): HGBA1C in the last 72 hours. CBG: No results for input(s): GLUCAP in the last 168 hours. Lipid Profile: No results for input(s): CHOL, HDL, LDLCALC, TRIG, CHOLHDL, LDLDIRECT in the last 72 hours. Thyroid Function Tests: No results for input(s): TSH, T4TOTAL, FREET4, T3FREE, THYROIDAB in the last 72 hours. Anemia Panel: No results for input(s): VITAMINB12, FOLATE, FERRITIN, TIBC, IRON, RETICCTPCT in the last 72 hours. Urine analysis:    Component Value Date/Time    COLORURINE YELLOW 10/20/2008 [place] 10/20/2008 0942   LABSPEC 1.011 10/20/2008 0942   PHURINE 6.0 10/20/2008 0942   GLUCOSEU NEGATIVE 10/20/2008 0942   HGBUR LARGE (A) 10/20/2008 0942   BILIRUBINUR NEGATIVE 10/20/2008 0942   KETONESUR NEGATIVE 10/20/2008 0942   PROTEINUR 100 (A) 10/20/2008 0942   UROBILINOGEN 1.0 10/20/2008 0942   NITRITE NEGATIVE 10/20/2008 0942   LEUKOCYTESUR SMALL (A) 10/20/2008 0942   Sepsis Labs: @LABRCNTIP (procalcitonin:4,lacticidven:4) ) Recent Results (from the past 240 hour(s))  Blood Culture (routine x 2)     Status: None (Preliminary result)   Collection Time: 12/23/18 11:38 AM  Result Value Ref Range Status   Specimen Description BLOOD RIGHT ANTECUBITAL  Final   Special Requests   Final    BOTTLES DRAWN AEROBIC AND ANAEROBIC Blood Culture adequate volume   Culture   Final    NO GROWTH 4 DAYS Performed at [facility], [place] [address] [place], [place] [address]    Report Status PENDING  Incomplete  Blood Culture (routine x 2)     Status: None (Preliminary result)   Collection Time: 12/23/18 12:45 PM  Result Value Ref Range Status   Specimen Description BLOOD RIGHT ANTECUBITAL  Final   Special Requests AEROBIC BOTTLE ONLY Blood Culture adequate volume  Final   Culture   Final    NO GROWTH 4 DAYS Performed at [facility], [place] [address], [place],  [address]    Report Status PENDING  Incomplete  MRSA PCR Screening     Status: None   Collection Time: 12/23/18  5:08 PM  Result Value Ref Range Status   MRSA by PCR NEGATIVE NEGATIVE Final    Comment:        The GeneXpert MRSA Assay (FDA approved for NASAL specimens only), is one  component of a comprehensive MRSA colonization surveillance program. It is not intended to diagnose MRSA infection nor to guide or monitor treatment for MRSA infections. Performed at [facility], [place] [address] [place], [place] [address]          Radiology  Studies: Dg Chest 2 View  Result Date: 12/27/2018 CLINICAL DATA:  Sepsis. EXAM: CHEST - 2 VIEW COMPARISON:  12/25/2018. FINDINGS: Stable cardiomegaly. Improving bilateral pulmonary interstitial prominence consistent improving interstitial edema and/or pneumonitis. No pleural effusion or pneumothorax. No acute bony abnormality. IMPRESSION: 1.  Stable cardiomegaly. 2. Improving bilateral from interstitial prominence consistent improving interstitial edema and/or pneumonitis. Electronically Signed   By: Marcello Moores  Register   On: 12/27/2018 07:35      Scheduled Meds: . acetaminophen  1,000 mg Oral TID  . atorvastatin  20 mg Oral q1800  . chlorhexidine  15 mL Mouth Rinse BID  . Chlorhexidine Gluconate Cloth  6 each Topical Q0600  . Chlorhexidine Gluconate Cloth  6 each Topical Q0600  . [START ON 12/30/2018] darbepoetin (ARANESP) injection - DIALYSIS  150 mcg Intravenous Q Fri-HD  . doxercalciferol  2 mcg Intravenous Q M,W,F-HD  . erythromycin   Left Eye Q8H  . hydrocerin   Topical BID  . mouth rinse  15 mL Mouth Rinse q12n4p  . metoprolol tartrate  25 mg Oral BID  . midodrine  10 mg Oral Q M,W,F-HD  . multivitamin  1 tablet Oral QHS  . pantoprazole  40 mg Oral BID   Continuous Infusions: . ceFEPime (MAXIPIME) IV 1 g (12/26/18 1748)  . vancomycin 750 mg (12/26/18 1517)     LOS: 4 days    Time spent in minutes: 17 min    Debbe Odea, MD [facility] Pager: [email or web address] Password [facility] 12/27/2018, 12:43 PM

## 2018-12-27 NOTE — Progress Notes (Signed)
Patient is from [facility]. CSW called and spoke with Lexine Baton, patient should be able to return to [facility] once he is medically stable.   CSW will complete a new FL2.   CSW will continue to follow until the patient is medically stable for discharge.   Domenic Schwab, MSW, [place]

## 2018-12-27 NOTE — Progress Notes (Addendum)
Daily Progress Note   Patient Name: [name]       Date: 12/27/2018 DOB: 11-13-1941  Age: 77 y.o. MRN#: [id number] Attending Physician: Debbe Odea, MD Primary Care Physician: Hennie Duos, MD Admit Date: 12/23/2018  Reason for Consultation/Follow-up: Establishing goals of care  Subjective: Patient awake, alert. Can tell me his name at that he is at the hospital. Intermittent confusion but able to participate in [place] discussion with family at bedside. Denies pain or discomfort. Asking for a hamburger.   GOC:   I have reviewed medical records, discussed with care team, and initially met with patients daughter [name]) and her husband Kennyth Lose) in conference room to discuss diagnosis, prognosis, [place], EOL wishes, disposition and options. We then further discussed goals and plan of care with patient at bedside.   Introduced Palliative Medicine as specialized medical care for people living with serious illness. It focuses on providing relief from the symptoms and stress of a serious illness. The goal is to improve quality of life for both the patient and the family.  We discussed a brief life review of the patient. Retired from [employer], where he worked for over 30 years. [name] shares his previous love with independence and traveling, especially cruises. She is his only living child. [name] believes his health began to decline around 2009. He has been on dialysis for at least five years. [name] shares that they had to move him from home to ALF August 2019. He has enjoyed living at ALF and has met friends/participates in activities. He has unfortunately had multiple hospital admissions since this October.   Discussed events leading up to hospitalization and course of hospitalization  including diagnoses, interventions, and prognosis. Extensively discussed difference between aggressive medical intervention and comfort care, including amputation versus focus on comfort/quality/dignity. Discussed high risk for wound healing with underlying co-morbidities and deconditioned state. Discussed quality of life versus quantity.  At this point, [name] requests we return to room and discuss with her father. She acknowledges that cognitively he may not understand medical complexity of situation but wishes to receive his input on pursuing amputation or shift to comfort focused care, especially since today he is more awake and alert.   Discussed simply with patient diagnoses, interventions, options, and prognosis. Patient does verbalize that this serious infection could lead to "  death." We did discuss amputations possibly extending his life but not necessarily impacting his quality of life moving forward with multiple co-morbidities. Patient wishes for his family to make this decision. [name] becomes tearful and states "we want you around a little longer" and shares her and Jackie's thoughts on pursing amputation if he is agreeable. Patient agreeable to pursue amputation.   We did discuss possible barriers to recovery following amputation including ability to rehab, nutritional status, ability to sit and tolerate dialysis, recurrent infection, and/or pain management. Explained that he will need higher level [facility] and may not return back to ALF pending ability to rehab.   Further discussed advanced directives, concepts specific to code status, and artifical feeding and hydration. Introduced MOST form. [name] tells me "he has a DNR in place." Further educated and completed MOST form with patient/daughter. DNR/DNI, limited interventions including cpap/bipap, IVF/ABX if indicated, and feeding tube for time trial. [name] wishes in a critical situation for limited interventions to be done with  "enough time for me to get there" MOST form copies made for family. Durable DNR in chart.   Kennyth Lose further expresses this being in "God's hands" and having faith that Mr. Welliver will tolerate surgery and be able to progress with therapy following surgery. Spiritual support provided.   Questions and concerns were addressed.  Hard Choices booklet left for review. PMT contact information given to daughter.    Length of Stay: 4  Current Medications: Scheduled Meds:  . acetaminophen  1,000 mg Oral TID  . atorvastatin  20 mg Oral q1800  . chlorhexidine  15 mL Mouth Rinse BID  . Chlorhexidine Gluconate Cloth  6 each Topical Q0600  . Chlorhexidine Gluconate Cloth  6 each Topical Q0600  . [START ON 12/30/2018] darbepoetin (ARANESP) injection - DIALYSIS  150 mcg Intravenous Q Fri-HD  . doxercalciferol  2 mcg Intravenous Q M,W,F-HD  . erythromycin   Left Eye Q8H  . hydrocerin   Topical BID  . mouth rinse  15 mL Mouth Rinse q12n4p  . metoprolol tartrate  25 mg Oral BID  . midodrine  10 mg Oral Q M,W,F-HD  . multivitamin  1 tablet Oral QHS  . pantoprazole  40 mg Oral BID    Continuous Infusions: . ceFEPime (MAXIPIME) IV 1 g (12/26/18 1748)  . vancomycin 750 mg (12/26/18 1517)    PRN Meds: acetaminophen **OR** acetaminophen, calcium carbonate (dosed in mg elemental calcium), camphor-menthol **AND** hydrOXYzine, docusate sodium, feeding supplement (NEPRO CARB STEADY), ondansetron **OR** ondansetron (ZOFRAN) IV, oxyCODONE, sorbitol, zolpidem  Physical Exam Vitals signs and nursing note reviewed.  Constitutional:      General: He is awake.     Appearance: He is ill-appearing.  HENT:     Head: Normocephalic and atraumatic.  Cardiovascular:     Rate and Rhythm: Normal rate.  Pulmonary:     Effort: No tachypnea, accessory muscle usage or respiratory distress.  Abdominal:     Tenderness: There is no abdominal tenderness.  Feet:     Comments: Bilateral heel wounds. Dressings C/D/I Skin:     General: Skin is warm and dry.  Neurological:     Mental Status: He is alert.     Comments: Oriented to person/place. Intermittent confusion  Psychiatric:        Mood and Affect: Mood normal.        Speech: Speech normal.            Vital Signs: BP 92/70   Pulse 96  Temp 98.3 F (36.8 C) (Oral)   Resp 16   Ht 5' 6" (1.676 m)   Wt 69.9 kg   SpO2 92%   BMI 24.86 kg/m  SpO2: SpO2: 92 % O2 Device: O2 Device: Room Air O2 Flow Rate: O2 Flow Rate (L/min): 2 L/min  Intake/output summary:   Intake/Output Summary (Last 24 hours) at 12/27/2018 0950 Last data filed at 12/26/2018 1623 Gross per 24 hour  Intake -  Output 1500 ml  Net -1500 ml   LBM: Last BM Date: 12/25/18 Baseline Weight: Weight: 68.9 kg Most recent weight: Weight: 69.9 kg       Palliative Assessment/Data: PPS 30%   Flowsheet Rows     Most Recent Value  Intake Tab  Referral Department  Hospitalist  Unit at Time of Referral  Intermediate Care Unit  Date Notified  12/24/18  Palliative Care Type  Return patient Palliative Care  Reason for referral  Clarify Goals of Care  Date of Admission  12/23/18  Date first seen by Palliative Care  12/25/18  # of days Palliative referral response time  1 Day(s)  # of days IP prior to Palliative referral  1  Clinical Assessment  Psychosocial & Spiritual Assessment  Palliative Care Outcomes      Patient Active Problem List   Diagnosis Date Noted  . Palliative care encounter   . OSA (obstructive sleep apnea)   . HTN (hypertension)   . ESRD on hemodialysis ([place])   . BPH (benign prostatic hyperplasia)   . Hypotension 12/07/2018  . GERD (gastroesophageal reflux disease) 12/07/2018  . Pressure injury of skin 12/03/2018  . Symptomatic anemia 12/02/2018  . Acute on chronic blood loss anemia 12/02/2018  . 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[place]) 12/02/2018  . Lower GI bleeding 12/02/2018  . Heme positive stool   . Acute on chronic diastolic (congestive) heart  failure ([place]) 11/19/2018  . Acute CVA (cerebrovascular accident) ([place]) 11/19/2018  . Hypoglycemia 11/19/2018  . Peripheral vascular disease due to secondary diabetes ([place]) 11/19/2018  . Dry gangrene ([place]) 11/19/2018  . 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 DNR (do not resuscitate) discussion   . Goals of care, counseling/discussion   . Palliative care by specialist   . Acute hypoxemic respiratory failure ([place]) 11/06/2018  . Volume overload 11/06/2018  . Multifocal pneumonia 11/06/2018  . Acute metabolic encephalopathy 19/62/2297  . Physical deconditioning 11/06/2018  . Acute respiratory failure with hypoxia and hypercapnia ([place]) 11/04/2018  . CAP (community acquired pneumonia) 11/04/2018  . Pleural effusion, right 11/04/2018  . Increased ammonia level 11/04/2018  . Abnormal CT of the abdomen 11/04/2018  . Atrial fibrillation with RVR ([place]) 11/04/2018  . Sepsis ([place]) 10/21/2018  . Acute encephalopathy 10/21/2018  . Elevated alkaline phosphatase level 10/21/2018  . Chronic anemia 10/21/2018  . Thrombocytopenia ([place]) 10/21/2018  . Pulmonary HTN ([place]) 03/04/2017  . Aortic valve stenosis 03/04/2017  . Hypertension, accelerated with heart disease, without CHF 01/12/2017  . Dyspnea on exertion 01/12/2017  . Hyperlipidemia 04/24/2011  . HYPERTENSION, BENIGN 10/24/2009  . DM (diabetes mellitus) ([place]) 10/23/2009  . OBSTRUCTIVE SLEEP APNEA 10/23/2009  . PAF (paroxysmal atrial fibrillation) ([place]) 10/23/2009  . CHF (congestive heart failure) ([place]) 10/23/2009    Palliative Care Assessment & Plan   Patient Profile: Palliative Care consult requested for this 77 y.o. male with multiple medical problems including ESRD on HD, OSA on CPAP, DM, diastolic dysfunction with history of CHF, A. fib on Coumadin, PVD with recent left  fifth ray amputation, who was recently hospitalized 11/06/2018 to 11/17/2018 with acute CVA and hypoxic respiratory failure from volume overload and was  readmitted 12/01/2018 to 12/04/2018 with symptomatic anemia thought likely due to lower GI bleed.  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  He is now readmitted from [facility] facility on 12/23/2018 with weakness, leukocytosis, and shortness of breath.  He was found to have multifocal pneumonia versus infected postop foot wound.  Vascular surgery has consulted and feels that patient would likely need bilateral lower extremity amputation.  Palliative care was consulted to help address goals.  Assessment: Leukocytosis ? HCAP versus infected diabetic foot wound PVD ESRD on hemodialysis PAF OSA  Recommendations/Plan:  After extensive GOC discussion with daughter, son-in-law, and patient, decision has been made to pursue bilateral amputation. Discussed with attending who will notify vascular surgery.   MOST form completed. DNR/DNI, limited interventions including CPAP/BiPAP if indicated, IVF/ABX if indicated, and feeding tube for time trial.   Continue current plan of care including hemodialysis.   PMT will continue to shadow and re-engage post-op if decline. May benefit from outpatient palliative referral if remains stable post-op.    Code Status: DNR   Code Status Orders  (From admission, onward)         Start     Ordered   12/23/18 1417  Do not attempt resuscitation (DNR)  Continuous    Question Answer Comment  In the event of cardiac or respiratory ARREST Do not call a "code blue"   In the event of cardiac or respiratory ARREST Do not perform Intubation, CPR, defibrillation or ACLS   In the event of cardiac or respiratory ARREST Use medication by any route, position, wound care, and other measures to relive pain and suffering. May use oxygen, suction and manual treatment of airway obstruction as needed for comfort.      12/23/18 1421        Code Status History    Date Active Date Inactive Code Status Order ID Comments  User Context   12/02/2018 0859 12/04/2018 1641 DNR [id number]  Lady Deutscher, MD ED   11/10/2018 1601 11/17/2018 1514 DNR [id number]  Drue Novel, NP Inpatient   11/06/2018 2126 11/10/2018 1601 Full Code [id number]  Shela Leff, MD ED   10/21/2018 0409 11/02/2018 2156 Full Code [id number]  Shela Leff, MD Inpatient       Prognosis:   Unable to determine  Discharge Planning:  To Be Determined  Care plan was discussed with patient, RN, daughter, SIL, Dr. Wynelle Cleveland  Thank you for allowing the Palliative Medicine Team to assist in the care of this patient.   Time In: 1000 Time Out: 1115 Total Time 75 Prolonged Time Billed yes      Greater than 50%  of this time was spent counseling and coordinating care related to the above assessment and plan.  Ihor Dow, FNP-C Palliative Medicine Team  Phone: [phone] Fax: [phone]  Please contact Palliative Medicine Team phone at [phone] for questions and concerns.

## 2018-12-27 NOTE — NC FL2 (Signed)
[place] LEVEL OF CARE SCREENING TOOL     IDENTIFICATION  Patient Name: [name] Birthdate: 02-13-1941 Sex: male Admission Date (Current Location): 12/23/2018  [facility] [place] Number:  Herbalist and Address:  [facility] [place]. [facility], [place] [address] [place], [place] [address]      Provider Number:    Attending Physician Name and Address:  Debbe Odea, MD  Relative Name and Phone Number:  [name], [place], [phone]    Current Level of Care: Hospital Recommended Level of Care: [place] Prior Approval Number: [id number]  Date Approved/Denied: 09/13/08 PASRR Number:    Discharge Plan: SNF    Current Diagnoses: Patient Active Problem List   Diagnosis Date Noted  . Palliative care encounter   . OSA (obstructive sleep apnea)   . HTN (hypertension)   . ESRD on hemodialysis ([place])   . BPH (benign prostatic hyperplasia)   . Hypotension 12/07/2018  . GERD (gastroesophageal reflux disease) 12/07/2018  . Pressure injury of skin 12/03/2018  . Symptomatic anemia 12/02/2018  . Acute on chronic blood loss anemia 12/02/2018  . 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 Lower GI bleeding 12/02/2018  . Heme positive stool   . Acute on chronic diastolic (congestive) heart failure ([place]) 11/19/2018  . Acute CVA (cerebrovascular accident) ([place]) 11/19/2018  . Hypoglycemia 11/19/2018  . Peripheral vascular disease due to secondary diabetes ([place]) 11/19/2018  . Dry gangrene ([place]) 11/19/2018  . 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 DNR (do not resuscitate) discussion   . Goals of care, counseling/discussion   . Palliative care by specialist   . Acute hypoxemic respiratory failure ([place]) 11/06/2018  . Volume overload 11/06/2018  . Multifocal pneumonia 11/06/2018  . Acute metabolic encephalopathy 26/83/4196  . Physical deconditioning 11/06/2018  . Acute respiratory failure with  hypoxia and hypercapnia ([place]) 11/04/2018  . CAP (community acquired pneumonia) 11/04/2018  . Pleural effusion, right 11/04/2018  . Increased ammonia level 11/04/2018  . Abnormal CT of the abdomen 11/04/2018  . Atrial fibrillation with RVR ([place]) 11/04/2018  . Sepsis ([place]) 10/21/2018  . Acute encephalopathy 10/21/2018  . Elevated alkaline phosphatase level 10/21/2018  . Chronic anemia 10/21/2018  . Thrombocytopenia ([place]) 10/21/2018  . Pulmonary HTN ([place]) 03/04/2017  . Aortic valve stenosis 03/04/2017  . Hypertension, accelerated with heart disease, without CHF 01/12/2017  . Dyspnea on exertion 01/12/2017  . Hyperlipidemia 04/24/2011  . HYPERTENSION, BENIGN 10/24/2009  . DM (diabetes mellitus) ([place]) 10/23/2009  . OBSTRUCTIVE SLEEP APNEA 10/23/2009  . PAF (paroxysmal atrial fibrillation) ([place]) 10/23/2009  . CHF (congestive heart failure) ([place]) 10/23/2009    Orientation RESPIRATION BLADDER Height & Weight     Self  Normal Continent Weight: 154 lb (69.9 kg) Height:  5\' 6"  (167.6 cm)  BEHAVIORAL SYMPTOMS/MOOD NEUROLOGICAL BOWEL NUTRITION STATUS      Continent Diet(Renal diet and restricted fluids)  AMBULATORY STATUS COMMUNICATION OF NEEDS Skin   Independent Verbally Surgical wounds, Bruising, Other (Comment)(Has ulcer and ecchymosis. Had toe amputation)                       Personal Care Assistance Level of Assistance  Bathing, Feeding, Dressing, Total care Bathing Assistance: Limited assistance Feeding assistance: Independent Dressing Assistance: Limited assistance Total Care Assistance: Limited assistance   Functional Limitations Info  Sight, Hearing, Speech Sight Info: Adequate Hearing Info: Impaired Speech Info: Adequate    SPECIAL CARE FACTORS FREQUENCY  PT (  By licensed PT), OT (By licensed OT)     PT Frequency: 5x/wk OT Frequency: 5x/wk            Contractures Contractures Info: Not present    Additional Factors Info  Code Status, Allergies Code  Status Info: DNR Allergies Info: Occlusive Silicone Sheets Silicone, Other, Tape           Current Medications (12/27/2018):  This is the current hospital active medication list Current Facility-Administered Medications  Medication Dose Route Frequency Provider Last Rate Last Dose  . acetaminophen (TYLENOL) tablet 650 mg  650 mg Oral Q6H PRN Karmen Bongo, MD       Or  . acetaminophen (TYLENOL) suppository 650 mg  650 mg Rectal Q6H PRN Karmen Bongo, MD      . acetaminophen (TYLENOL) tablet 1,000 mg  1,000 mg Oral TID Karmen Bongo, MD   1,000 mg at 12/27/18 0917  . atorvastatin (LIPITOR) tablet 20 mg  20 mg Oral q1800 Karmen Bongo, MD   20 mg at 12/26/18 1720  . calcium carbonate (dosed in mg elemental calcium) suspension 500 mg of elemental calcium  500 mg of elemental calcium Oral Q6H PRN Karmen Bongo, MD      . camphor-menthol [facility]) lotion 1 application  1 application Topical K9T PRN Karmen Bongo, MD       And  . hydrOXYzine (ATARAX/VISTARIL) tablet 25 mg  25 mg Oral Q8H PRN Karmen Bongo, MD      . ceFEPIme (MAXIPIME) 1 g in sodium chloride 0.9 % 100 mL IVPB  1 g Intravenous Q24H Karmen Bongo, MD 200 mL/hr at 12/27/18 1254 1 g at 12/27/18 1254  . chlorhexidine (PERIDEX) 0.12 % solution 15 mL  15 mL Mouth Rinse BID Karmen Bongo, MD   15 mL at 12/27/18 1000  . Chlorhexidine Gluconate Cloth 2 % PADS 6 each  6 each Topical Q0600 Valentina Gu, NP   Stopped at 12/26/18 [phone]  . Chlorhexidine Gluconate Cloth 2 % PADS 6 each  6 each Topical I4580 Mauricia Area, MD   6 each at 12/26/18 [phone]  . [START ON 12/30/2018] Darbepoetin Alfa (ARANESP) injection 150 mcg  150 mcg Intravenous Q Fri-HD Deterding, Jeneen Rinks, MD      . docusate sodium [facility]) enema 283 mg  1 enema Rectal PRN Karmen Bongo, MD      . doxercalciferol (HECTOROL) injection 2 mcg  2 mcg Intravenous Q M,W,F-HD Karmen Bongo, MD   2 mcg at 12/26/18 1404  . erythromycin ophthalmic ointment   Left Eye  Q8H Karmen Bongo, MD      . feeding supplement (NEPRO CARB STEADY) liquid 237 mL  237 mL Oral TID PRN Karmen Bongo, MD      . hydrocerin (EUCERIN) cream   Topical BID Debbe Odea, MD      . MEDLINE mouth rinse  15 mL Mouth Rinse q12n4p Karmen Bongo, MD   15 mL at 12/27/18 1200  . metoprolol tartrate (LOPRESSOR) tablet 25 mg  25 mg Oral BID Debbe Odea, MD   25 mg at 12/27/18 0917  . midodrine (PROAMATINE) tablet 10 mg  10 mg Oral Q M,W,F-HD Karmen Bongo, MD   10 mg at 12/26/18 0902  . multivitamin (RENA-VIT) tablet 1 tablet  1 tablet Oral Nile Riggs, MD   1 tablet at 12/26/18 2228  . ondansetron (ZOFRAN) tablet 4 mg  4 mg Oral Q6H PRN Karmen Bongo, MD       Or  . ondansetron [facility]) injection  4 mg  4 mg Intravenous Q6H PRN Karmen Bongo, MD   4 mg at 12/24/18 2249  . oxyCODONE (Oxy IR/ROXICODONE) immediate release tablet 5 mg  5 mg Oral Q4H PRN Karmen Bongo, MD   5 mg at 12/26/18 0444  . pantoprazole (PROTONIX) EC tablet 40 mg  40 mg Oral BID Karmen Bongo, MD   40 mg at 12/27/18 0737  . sorbitol 70 % solution 30 mL  30 mL Oral PRN Karmen Bongo, MD      . vancomycin (VANCOCIN) IVPB 750 mg/150 ml premix  750 mg Intravenous Q M,W,F-HD Karmen Bongo, MD 150 mL/hr at 12/26/18 1517 750 mg at 12/26/18 1517  . zolpidem (AMBIEN) tablet 5 mg  5 mg Oral QHS PRN Karmen Bongo, MD         Discharge Medications: Please see discharge summary for a list of discharge medications.  Relevant Imaging Results:  Relevant Lab Results:   Additional Information SSN: [id number]  [place], LCSWA

## 2018-12-27 NOTE — Progress Notes (Signed)
ANTICOAGULATION CONSULT NOTE - Follow Up Consult  Pharmacy Consult for Coumadin Indication: atrial fibrillation  Allergies  Allergen Reactions  . Occlusive Silicone Sheets [Silicone]   . Other     Occlusive adhesive  . Tape Itching and Other (See Comments)    Cloth tape only    Patient Measurements: Height: 5\' 6"  (167.6 cm) Weight: 154 lb (69.9 kg) IBW/kg (Calculated) : 63.8 Heparin Dosing Weight: n/a  Vital Signs: Temp: 98.3 F (36.8 C) (12/31 0411) Temp Source: Oral (12/31 0411) BP: 92/70 (12/31 0400) Pulse Rate: 96 (12/31 0400)  Labs: Recent Labs    12/25/18 0254 12/26/18 0243 12/27/18 0248  HGB 9.6* 9.7* 10.0*  HCT 31.2* 32.3* 32.5*  PLT 215 202 196  LABPROT 38.7* 38.2* 31.8*  INR 4.05* 3.97 3.13  CREATININE 3.19* 4.95* 3.13*    Estimated Creatinine Clearance: 17.6 mL/min (A) (by C-G formula based on SCr of 3.13 mg/dL (H)).   Medical History: Past Medical History:  Diagnosis Date  . A-fib ([place])   . Anemia   . Blood transfusion   . BPH (benign prostatic hyperplasia)   . CHF (congestive heart failure) ([place])   . Diarrhea   . DM (diabetes mellitus) ([place])   . ESRD on hemodialysis ([place])    Started dialysis in 2009  . History of GI bleed    secondary to coumadin  . HTN (hypertension)   . Hyperlipidemia   . OSA (obstructive sleep apnea)    uses CPAP  . Secondary hyperparathyroidism of renal origin ([place])     Medications:  Scheduled:  . acetaminophen  1,000 mg Oral TID  . atorvastatin  20 mg Oral q1800  . chlorhexidine  15 mL Mouth Rinse BID  . Chlorhexidine Gluconate Cloth  6 each Topical Q0600  . Chlorhexidine Gluconate Cloth  6 each Topical Q0600  . [START ON 12/30/2018] darbepoetin (ARANESP) injection - DIALYSIS  150 mcg Intravenous Q Fri-HD  . doxercalciferol  2 mcg Intravenous Q M,W,F-HD  . erythromycin   Left Eye Q8H  . hydrocerin   Topical BID  . mouth rinse  15 mL Mouth Rinse q12n4p  . metoprolol tartrate  25 mg Oral BID  . midodrine  10  mg Oral Q M,W,F-HD  . multivitamin  1 tablet Oral QHS  . pantoprazole  40 mg Oral BID    Assessment: 77 yo male admitted for weakness.  On Coumadin PTA for afib, per pt report.  PTA Coumadin dose on hold for high INR.  INR downtrending, 8.6>6.48>4.05 > 3.97 > 3.13. CBC stable - no bleeding noted. Will hold off one more day on resuming warfarin due to pending palliative meeting today which may determine amputation plans.  *Warfarin PTA dose: 3 mg daily  Goal of Therapy:  INR 2-3 Monitor platelets by anticoagulation protocol: Yes   Plan:  Hold Coumadin again tonight Daily INR/CBC Will continue to monitor for any signs/symptoms of bleeding and will follow up with PT/INR in the a.m.   Thank you for allowing pharmacy to be a part of this patient's care.  Alycia Rossetti, PharmD, BCPS Clinical Pharmacist Pager: [phone] Clinical phone for 12/27/2018 from 7a-3:30p: [phone] If after 3:30p, please call main pharmacy at: [phone] Please check AMION for all [place] numbers 12/27/2018 9:10 AM

## 2018-12-27 NOTE — Progress Notes (Signed)
   I have seen and evaluated the patient with foul-smelling gangrenous amputation site in the left foot as well as eschar on the right heel.  I discussed with the patient as well as the daughter via telephone that we will proceed with bilateral above-knee amputations tentatively on Thursday.  I will call her tomorrow to confirm and he will need to be n.p.o. past midnight on Wednesday.   Nea Gittens C. Donzetta Matters, MD [facility] Office: [phone] Pager: [phone]

## 2018-12-28 ENCOUNTER — Other Ambulatory Visit: Payer: Self-pay

## 2018-12-28 LAB — CBC
HCT: 32.6 % — ABNORMAL LOW (ref 39.0–52.0)
Hemoglobin: 10.2 g/dL — ABNORMAL LOW (ref 13.0–17.0)
MCH: 30.8 pg (ref 26.0–34.0)
MCHC: 31.3 g/dL (ref 30.0–36.0)
MCV: 98.5 fL (ref 80.0–100.0)
Platelets: 175 10*3/uL (ref 150–400)
RBC: 3.31 MIL/uL — ABNORMAL LOW (ref 4.22–5.81)
RDW: 18.1 % — ABNORMAL HIGH (ref 11.5–15.5)
WBC: 15.6 10*3/uL — ABNORMAL HIGH (ref 4.0–10.5)
nRBC: 0.3 % — ABNORMAL HIGH (ref 0.0–0.2)

## 2018-12-28 LAB — RENAL FUNCTION PANEL
Albumin: 2.4 g/dL — ABNORMAL LOW (ref 3.5–5.0)
Anion gap: 13 (ref 5–15)
BUN: 56 mg/dL — ABNORMAL HIGH (ref 8–23)
CO2: 25 mmol/L (ref 22–32)
Calcium: 9.1 mg/dL (ref 8.9–10.3)
Chloride: 99 mmol/L (ref 98–111)
Creatinine, Ser: 5 mg/dL — ABNORMAL HIGH (ref 0.61–1.24)
GFR calc Af Amer: 12 mL/min — ABNORMAL LOW (ref >60)
GFR calc non Af Amer: 10 mL/min — ABNORMAL LOW (ref >60)
Glucose, Bld: 138 mg/dL — ABNORMAL HIGH (ref 70–99)
Phosphorus: 2.4 mg/dL — ABNORMAL LOW (ref 2.5–4.6)
Potassium: 4.4 mmol/L (ref 3.5–5.1)
Sodium: 137 mmol/L (ref 135–145)

## 2018-12-28 LAB — PROTIME-INR
INR: 2.64
Prothrombin Time: 27.8 seconds — ABNORMAL HIGH (ref 11.4–15.2)

## 2018-12-28 LAB — CULTURE, BLOOD (ROUTINE X 2)
Culture: NO GROWTH
Culture: NO GROWTH
Special Requests: ADEQUATE
Special Requests: ADEQUATE

## 2018-12-28 MED ORDER — NEPRO/CARBSTEADY PO LIQD
237.0000 mL | Freq: Two times a day (BID) | ORAL | Status: DC
  Administered 2018-12-28 – 2019-01-03 (×5): 237 mL via ORAL
  Filled 2018-12-28 (×14): qty 237

## 2018-12-28 MED ORDER — PHYTONADIONE 5 MG PO TABS
2.5000 mg | ORAL_TABLET | Freq: Once | ORAL | Status: AC
  Administered 2018-12-28: 2.5 mg via ORAL
  Filled 2018-12-28: qty 1

## 2018-12-28 MED ORDER — VANCOMYCIN HCL IN DEXTROSE 750-5 MG/150ML-% IV SOLN
INTRAVENOUS | Status: AC
  Administered 2018-12-28: 750 mg via INTRAVENOUS
  Filled 2018-12-28: qty 150

## 2018-12-28 MED ORDER — DOXERCALCIFEROL 4 MCG/2ML IV SOLN
INTRAVENOUS | Status: AC
  Administered 2018-12-28: 2 ug via INTRAVENOUS
  Filled 2018-12-28: qty 2

## 2018-12-28 MED ORDER — PRO-STAT SUGAR FREE PO LIQD
30.0000 mL | Freq: Two times a day (BID) | ORAL | Status: DC
  Administered 2018-12-28 – 2019-01-03 (×7): 30 mL via ORAL
  Filled 2018-12-28 (×10): qty 30

## 2018-12-28 MED ORDER — MIDODRINE HCL 5 MG PO TABS
ORAL_TABLET | ORAL | Status: AC
  Administered 2018-12-28: 10 mg via ORAL
  Filled 2018-12-28: qty 2

## 2018-12-28 NOTE — Progress Notes (Signed)
ANTICOAGULATION CONSULT NOTE - Follow Up Consult  Pharmacy Consult for Coumadin Indication: atrial fibrillation  Allergies  Allergen Reactions  . Occlusive Silicone Sheets [Silicone]   . Other     Occlusive adhesive  . Tape Itching and Other (See Comments)    Cloth tape only    Patient Measurements: Height: 5\' 6"  (167.6 cm) Weight: 153 lb 3.5 oz (69.5 kg) IBW/kg (Calculated) : 63.8 Heparin Dosing Weight: n/a  Vital Signs: Temp: 97.9 F (36.6 C) (01/01 0731) Temp Source: Oral (01/01 0731) BP: 95/60 (01/01 0743) Pulse Rate: 62 (01/01 0743)  Labs: Recent Labs    12/26/18 0243 12/27/18 0248 12/28/18 0253  HGB 9.7* 10.0* 10.2*  HCT 32.3* 32.5* 32.6*  PLT 202 196 175  LABPROT 38.2* 31.8* 27.8*  INR 3.97 3.13 2.64  CREATININE 4.95* 3.13* 5.00*    Estimated Creatinine Clearance: 11 mL/min (A) (by C-G formula based on SCr of 5 mg/dL (H)).   Medical History: Past Medical History:  Diagnosis Date  . A-fib ([place])   . Anemia   . Blood transfusion   . BPH (benign prostatic hyperplasia)   . CHF (congestive heart failure) ([place])   . Diarrhea   . DM (diabetes mellitus) ([place])   . ESRD on hemodialysis ([place])    Started dialysis in 2009  . History of GI bleed    secondary to coumadin  . HTN (hypertension)   . Hyperlipidemia   . OSA (obstructive sleep apnea)    uses CPAP  . Secondary hyperparathyroidism of renal origin ([place])     Medications:  Scheduled:  . acetaminophen  1,000 mg Oral TID  . atorvastatin  20 mg Oral q1800  . chlorhexidine  15 mL Mouth Rinse BID  . Chlorhexidine Gluconate Cloth  6 each Topical Q0600  . Chlorhexidine Gluconate Cloth  6 each Topical Q0600  . [START ON 12/30/2018] darbepoetin (ARANESP) injection - DIALYSIS  150 mcg Intravenous Q Fri-HD  . doxercalciferol  2 mcg Intravenous Q M,W,F-HD  . erythromycin   Left Eye Q8H  . hydrocerin   Topical BID  . mouth rinse  15 mL Mouth Rinse q12n4p  . metoprolol tartrate  25 mg Oral BID  . midodrine   10 mg Oral Q M,W,F-HD  . multivitamin  1 tablet Oral QHS  . pantoprazole  40 mg Oral BID    Assessment: 78 yo male admitted for weakness.  On Coumadin PTA for afib, per pt report.  PTA Coumadin dose on hold for high INR.  INR downtrending, 8.6>>2.64. CBC stable - no bleeding noted. Per notes yesterday, planning bilateral AKAs tomorrow.  *Warfarin PTA dose: 3 mg daily  Goal of Therapy:  INR 2-3 Monitor platelets by anticoagulation protocol: Yes   Plan:  Hold Coumadin, f/u plans to resume s/p amputations. Vascular to address need for vitamin K?  [name], [facility] Clinical Pharmacist Phone [phone]  12/28/2018 7:56 AM

## 2018-12-28 NOTE — Progress Notes (Signed)
Chart reviewed and discussed with RN. Patient resting comfortably upon arrival to room. No family at bedside. PMT NP will be available tomorrow if family further wishes to discuss goals of care/plan for amputations.   NO CHARGE  Ihor Dow, FNP-C Palliative Medicine Team  Phone: [phone] Fax: [phone]

## 2018-12-28 NOTE — Progress Notes (Signed)
Patient refuses CPAP. No machine in the room. RT will continue to monitor.

## 2018-12-28 NOTE — Plan of Care (Signed)
  Problem: Education: Goal: Knowledge of General Education information will improve Description Including pain rating scale, medication(s)/side effects and non-pharmacologic comfort measures Outcome: Not Progressing Note:  Patient disoriented and unable to demonstrate or verbalize understanding of general education

## 2018-12-28 NOTE — Progress Notes (Signed)
PROGRESS NOTE    [name]   TXM:[id number]  DOB: Jun 12, 1941  DOA: 12/23/2018 PCP: Hennie Duos, MD   Brief Narrative:    [name] is a 78 y.o. male with medical history significant for dementia, ESRD on HD; DM; OSA on CPAP; HTN; HLD; CHF; PVD with L 5th ray amputation on 12/10; and afib on Coumadin presenting with weakness. He has dementia and is unable to give history.  Sent from [place] for subjective fevers, weakness. He underwent left fifth toe amputation on 12/10 secondary to gangrene. The staff spoke with Vascular surgery and was recommended to send patient to ED. He had received a dose of IM Ceftraixone the day before.  EMS reports the patient was also noted to be short of breath on arrival. He was on 3L [place]. In ED, diagnosed with multifocal pneumonia and started on Vancomycin, Levaquin and Cefepime. In ED > ED, WBC 17.9, T-99 rectal, BP 115/63, HR 103. CXR shows Asymmetric airspace pulmonary edema versus multifocal bilateral pneumonia, R>L. BC X 2 ordered.  Was dialyzed overnight as he was volume overloaded per nephro.  Subjective: No complaints today.     Assessment & Plan:   Principal Problem:   Sepsis> Subjective fevers, leukocytosis, acute respiratory failure, tachycardia - WBC 17 on admission - ?HCAP vs infected post op foot wound (foul smelling with discharge) suspected as a source of infection -   blood cultures neg - he was dialyzed x 2 in the first 24 hrs to remove excess fluid but continues to have b/l infiltrates on CXR- continues to have coarse RLL crackles-  Cont treatment with Vanc and Cefepime -   Pulse ox now 100% on room air- WBC count improving  Active Problems:  Gangrene of the foot, PVD - s/p left superficial femoral artery occlusion and posterior tibial angioplasty with Dr. Trula Slade on 11/15/2018 - 12/10 Ray amputation left fifth toe- dr Scot Dock - b/l foot wounds noted  - Vascular surgery recommended palliative care vs b/l  amputations - palliative care consulted on 12/28   -after palliative care consult on 12/31,  the patient and his family would like to proceed with amputations-  Per Dr Claretha Cooper discussion with him today, the patient does not want the amputation- plan per Vascular- I have ordered a dose of Vit K today as recommended by Dr Donzetta Matters to bring down INR pre-op  ESRD on hemodialysis / fluid overloaded - d/w Dr Deterding who feels he was fluid overloaded on admission- back to back dialysis x 2 done - on Midodrine with  dialysis    PAF (paroxysmal atrial fibrillation)/ coagulopathy - INR ~8 on admission- steadily improving - - holding coumadin- given Vit K for OR - Lopressor resumed- HR much better controlled     Dementia with acute encephalopathy due to infection  - seems improved from admission but continues to have significant memory deficits    OBSTRUCTIVE SLEEP APNEA - CPAP ordered  Conjuctivitis -Treated on 12/24 with garamycin drops -Appears to only be present on the left at this time -cont erythromycin ophthalmic ointment  GERD? PUD? - on BID Protonix as outpt - continue      DVT prophylaxis: INR still elevated Code Status: DNR Disposition: follow on telemetry Family Communication: Consultants:   Nephrology  Vascular surgery Procedures:   none Antimicrobials:  Anti-infectives (From admission, onward)   Start     Dose/Rate Route Frequency Ordered Stop   12/26/18 1200  vancomycin (VANCOCIN) IVPB 750 mg/150 ml premix  750 mg 150 mL/hr over 60 Minutes Intravenous Every M-W-F (Hemodialysis) 12/23/18 1231     12/25/18 1800  levofloxacin (LEVAQUIN) IVPB 500 mg  Status:  Discontinued     500 mg 100 mL/hr over 60 Minutes Intravenous Every 48 hours 12/23/18 1805 12/25/18 1116   12/25/18 0745  vancomycin (VANCOCIN) IVPB 750 mg/150 ml premix     750 mg 150 mL/hr over 60 Minutes Intravenous  Once 12/25/18 0723 12/25/18 0953   12/24/18 1300  ceFEPIme (MAXIPIME) 1 g in sodium  chloride 0.9 % 100 mL IVPB     1 g 200 mL/hr over 30 Minutes Intravenous Every 24 hours 12/23/18 1230     12/24/18 0200  vancomycin (VANCOCIN) IVPB 750 mg/150 ml premix     750 mg 150 mL/hr over 60 Minutes Intravenous  Once 12/23/18 2049 12/24/18 0150   12/23/18 1815  levofloxacin (LEVAQUIN) IVPB 750 mg  Status:  Discontinued     750 mg 100 mL/hr over 90 Minutes Intravenous  Once 12/23/18 1805 12/25/18 1116   12/23/18 1245  vancomycin (VANCOCIN) 1,500 mg in sodium chloride 0.9 % 500 mL IVPB     1,500 mg 250 mL/hr over 120 Minutes Intravenous  Once 12/23/18 1230 12/23/18 1536   12/23/18 1245  ceFEPIme (MAXIPIME) 2 g in sodium chloride 0.9 % 100 mL IVPB     2 g 200 mL/hr over 30 Minutes Intravenous  Once 12/23/18 1230 12/23/18 1325       Objective: Vitals:   12/28/18 1000 12/28/18 1030 12/28/18 1100 12/28/18 1130  BP: 119/62 (!) 84/51 91/72 120/66  Pulse: 94 93 86 95  Resp:      Temp:      TempSrc:      SpO2:      Weight:      Height:        Intake/Output Summary (Last 24 hours) at 12/28/2018 1159 Last data filed at 12/27/2018 1700 Gross per 24 hour  Intake 480 ml  Output -  Net 480 ml   Filed Weights   12/26/18 1210 12/26/18 1623 12/28/18 0731  Weight: 71.2 kg 69.9 kg 69.5 kg    Examination: General exam: Appears comfortable  HEENT: PERRL, mouth dry Respiratory system:  CTA - pulse ox 100% Cardiovascular system: S1 & S2 heard, IIRR, HR in low 90-100-  2/6 murmur at LUS, RUS border and at apex Gastrointestinal system: Abdomen soft, non-tender, nondistended. Normal bowel sounds. Central nervous system: Alert-disoriented to time - No focal neurological deficits. Skin: very foul smelling wound left foot   Psychiatry:  Mood  appropriate.       Data Reviewed: I have personally reviewed following labs and imaging studies  CBC: Recent Labs  Lab 12/23/18 1128 12/24/18 0227 12/25/18 0254 12/26/18 0243 12/27/18 0248 12/28/18 0253  WBC 17.9* 15.6* 12.5* 12.3*  14.1* 15.6*  NEUTROABS 15.5* 13.0*  --   --   --   --   HGB 9.5* 10.5* 9.6* 9.7* 10.0* 10.2*  HCT 31.7* 33.5* 31.2* 32.3* 32.5* 32.6*  MCV 100.6* 99.4 101.0* 99.1 100.9* 98.5  PLT 223 204 215 202 196 517   Basic Metabolic Panel: Recent Labs  Lab 12/24/18 0227 12/25/18 0254 12/26/18 0243 12/27/18 0248 12/28/18 0253  NA 143 140 135 137 137  K 3.8 3.7 4.1 4.5 4.4  CL 99 98 95* 98 99  CO2 27 29 24 26 25   GLUCOSE 109* 137* 182* 200* 138*  BUN 48* 25* 53* 27* 56*  CREATININE 5.30* 3.19* 4.95*  3.13* 5.00*  CALCIUM 9.8 9.2 9.1 8.8* 9.1  PHOS 1.8* 1.7* 2.3* 1.8* 2.4*   GFR: Estimated Creatinine Clearance: 11 mL/min (A) (by C-G formula based on SCr of 5 mg/dL (H)). Liver Function Tests: Recent Labs  Lab 12/23/18 1128 12/24/18 0227 12/25/18 0254 12/26/18 0243 12/27/18 0248 12/28/18 0253  AST 26  --   --   --   --   --   ALT 20  --   --   --   --   --   ALKPHOS 290*  --   --   --   --   --   BILITOT 0.9  --   --   --   --   --   PROT 7.0  --   --   --   --   --   ALBUMIN 2.4* 2.5* 2.3* 2.4* 2.3* 2.4*   No results for input(s): LIPASE, AMYLASE in the last 168 hours. No results for input(s): AMMONIA in the last 168 hours. Coagulation Profile: Recent Labs  Lab 12/24/18 0925 12/25/18 0254 12/26/18 0243 12/27/18 0248 12/28/18 0253  INR 6.48* 4.05* 3.97 3.13 2.64   Cardiac Enzymes: No results for input(s): CKTOTAL, CKMB, CKMBINDEX, TROPONINI in the last 168 hours. BNP (last 3 results) No results for input(s): PROBNP in the last 8760 hours. HbA1C: No results for input(s): HGBA1C in the last 72 hours. CBG: No results for input(s): GLUCAP in the last 168 hours. Lipid Profile: No results for input(s): CHOL, HDL, LDLCALC, TRIG, CHOLHDL, LDLDIRECT in the last 72 hours. Thyroid Function Tests: No results for input(s): TSH, T4TOTAL, FREET4, T3FREE, THYROIDAB in the last 72 hours. Anemia Panel: No results for input(s): VITAMINB12, FOLATE, FERRITIN, TIBC, IRON, RETICCTPCT in  the last 72 hours. Urine analysis:    Component Value Date/Time   COLORURINE YELLOW 10/20/2008 [place] 10/20/2008 0942   LABSPEC 1.011 10/20/2008 0942   PHURINE 6.0 10/20/2008 0942   GLUCOSEU NEGATIVE 10/20/2008 0942   HGBUR LARGE (A) 10/20/2008 0942   BILIRUBINUR NEGATIVE 10/20/2008 0942   KETONESUR NEGATIVE 10/20/2008 0942   PROTEINUR 100 (A) 10/20/2008 0942   UROBILINOGEN 1.0 10/20/2008 0942   NITRITE NEGATIVE 10/20/2008 0942   LEUKOCYTESUR SMALL (A) 10/20/2008 0942   Sepsis Labs: @LABRCNTIP (procalcitonin:4,lacticidven:4) ) Recent Results (from the past 240 hour(s))  Blood Culture (routine x 2)     Status: None (Preliminary result)   Collection Time: 12/23/18 11:38 AM  Result Value Ref Range Status   Specimen Description BLOOD RIGHT ANTECUBITAL  Final   Special Requests   Final    BOTTLES DRAWN AEROBIC AND ANAEROBIC Blood Culture adequate volume   Culture   Final    NO GROWTH 4 DAYS Performed at [facility], [place] [address], [place], [place] [address]    Report Status PENDING  Incomplete  Blood Culture (routine x 2)     Status: None (Preliminary result)   Collection Time: 12/23/18 12:45 PM  Result Value Ref Range Status   Specimen Description BLOOD RIGHT ANTECUBITAL  Final   Special Requests AEROBIC BOTTLE ONLY Blood Culture adequate volume  Final   Culture   Final    NO GROWTH 4 DAYS Performed at [facility], [place] [address], [place], [place] [address]    Report Status PENDING  Incomplete  MRSA PCR Screening     Status: None   Collection Time: 12/23/18  5:08 PM  Result Value Ref Range Status   MRSA by PCR  NEGATIVE NEGATIVE Final    Comment:        The GeneXpert MRSA Assay (FDA approved for NASAL specimens only), is one component of a comprehensive MRSA colonization surveillance program. It is not intended to diagnose MRSA infection nor to guide or monitor treatment for MRSA infections. Performed at [facility], [place] [address] [place], [place] [address]          Radiology Studies: Dg Chest 2 View  Result Date: 12/27/2018 CLINICAL DATA:  Sepsis. EXAM: CHEST - 2 VIEW COMPARISON:  12/25/2018. FINDINGS: Stable cardiomegaly. Improving bilateral pulmonary interstitial prominence consistent improving interstitial edema and/or pneumonitis. No pleural effusion or pneumothorax. No acute bony abnormality. IMPRESSION: 1.  Stable cardiomegaly. 2. Improving bilateral from interstitial prominence consistent improving interstitial edema and/or pneumonitis. Electronically Signed   By: Marcello Moores  Register   On: 12/27/2018 07:35      Scheduled Meds: . acetaminophen  1,000 mg Oral TID  . atorvastatin  20 mg Oral q1800  . chlorhexidine  15 mL Mouth Rinse BID  . Chlorhexidine Gluconate Cloth  6 each Topical Q0600  . Chlorhexidine Gluconate Cloth  6 each Topical Q0600  . [START ON 12/30/2018] darbepoetin (ARANESP) injection - DIALYSIS  150 mcg Intravenous Q Fri-HD  . doxercalciferol  2 mcg Intravenous Q M,W,F-HD  . erythromycin   Left Eye Q8H  . feeding supplement (NEPRO CARB STEADY)  237 mL Oral BID BM  . feeding supplement (PRO-STAT SUGAR FREE 64)  30 mL Oral BID  . hydrocerin   Topical BID  . mouth rinse  15 mL Mouth Rinse q12n4p  . metoprolol tartrate  25 mg Oral BID  . midodrine  10 mg Oral Q M,W,F-HD  . multivitamin  1 tablet Oral QHS  . pantoprazole  40 mg Oral BID  . phytonadione  2.5 mg Oral Once   Continuous Infusions: . ceFEPime (MAXIPIME) IV 1 g (12/27/18 1254)  . vancomycin 750 mg (12/28/18 1029)     LOS: 5 days    Time spent in minutes: 65 min    Debbe Odea, MD [facility] Pager: [email or web address] Password [email or web address] 12/28/2018, 11:59 AM

## 2018-12-28 NOTE — Progress Notes (Signed)
RT Note:  Spoke with RN about patient status to see if we could possible put a CPAP on patient.  RN stated that she believed that he would not keep CPAP on if tried.  Patient still a little confused.

## 2018-12-28 NOTE — Progress Notes (Addendum)
[facility] Progress Note   Dialysis Orders:  AF MWF 2K 2.25 Ca EDW 69.5 400/A 1.5 profile 2 var NA linear 4hr lt AVF Hect 66mcg IV qtx Mircera 200 mcg q2weeks (last 12/18/18)   No heparin  Assessment/Plan: 1 ESRD  MWF tolerating HD K 4.4 2 Fever  Left foot amputation site drainage  and right heel eschar - Dr. Donzetta Matters has discussed with daughter to proceed with bilateral AKAs on Thursday but today pt is telling surgeon he does not want surgery; needs further clarification - on Vanc and Maxepime BC no growth 3 Anemia hgb 10.2 - Aranesp 150 ordered for Friday HD 4 HPTH VDRA - hypophosphatemia - have stopped phoslo - resume as needed when intake improves 5 Goals of care -appreciate palliative care conversations with family - DNR yet family wishes to proceed with bilateral AKA MOST form completed- will need d/c to SNF 6 Dementia aggravated by fever 7 DM controlled 9 BP/volume - CXR yesterday - improving edema/and or pneumonitis - not sure BP will tolerate goal - keep systolic BP> 161 10 Malnutrition - low alb - add Nepro/prostat - may need to liberalize diet after surgery to promote intake  Myriam Jacobson, PA-C [place] 12/28/2018,8:31 AM  LOS: 5 days   I have seen and examined this patient and agree with the plan of care. Pt seen on HD 116/58  On 2/2.25 400/800 Goal net UF 2.2 L, no c/o cramping No changes for now.  Appreciate palliative seeing pt; daughter made decision to proceed with amputation w/ planned b/l AKA Thur but the pt voiced a change in mind today and didn't want the amputation. Will need to meet with the pt and daughter at same time again. Per palliative meeting, the pt wanted the daughter to make the decision.  Dwana Melena, MD 12/28/2018, 9:52 AM   Subjective:   Does not recognize me.  Objective Vitals:   12/28/18 0731 12/28/18 0738 12/28/18 0743 12/28/18 0800  BP: 121/80 105/60 95/60 108/64  Pulse: (!) 102 77  62 91  Resp: 18     Temp: 97.9 F (36.6 C)     TempSrc: Oral     SpO2: 100%     Weight: 69.5 kg     Height:       Physical Exam goal 2.5 General: lying in bed on HD - mittens off hands Heart: RRR Lungs: clear anteriorly poor expansion - on room air Abdomen: soft NT Extremities: no LE edema - feet in booties - wounds not examined Dialysis Access:  Left AVF Qb 400   Additional Objective Labs: Basic Metabolic Panel: Recent Labs  Lab 12/26/18 0243 12/27/18 0248 12/28/18 0253  NA 135 137 137  K 4.1 4.5 4.4  CL 95* 98 99  CO2 24 26 25   GLUCOSE 182* 200* 138*  BUN 53* 27* 56*  CREATININE 4.95* 3.13* 5.00*  CALCIUM 9.1 8.8* 9.1  PHOS 2.3* 1.8* 2.4*   Liver Function Tests: Recent Labs  Lab 12/23/18 1128  12/26/18 0243 12/27/18 0248 12/28/18 0253  AST 26  --   --   --   --   ALT 20  --   --   --   --   ALKPHOS 290*  --   --   --   --   BILITOT 0.9  --   --   --   --   PROT 7.0  --   --   --   --  ALBUMIN 2.4*   < > 2.4* 2.3* 2.4*   < > = values in this interval not displayed.   No results for input(s): LIPASE, AMYLASE in the last 168 hours. CBC: Recent Labs  Lab 12/23/18 1128 12/24/18 0227 12/25/18 0254 12/26/18 0243 12/27/18 0248 12/28/18 0253  WBC 17.9* 15.6* 12.5* 12.3* 14.1* 15.6*  NEUTROABS 15.5* 13.0*  --   --   --   --   HGB 9.5* 10.5* 9.6* 9.7* 10.0* 10.2*  HCT 31.7* 33.5* 31.2* 32.3* 32.5* 32.6*  MCV 100.6* 99.4 101.0* 99.1 100.9* 98.5  PLT 223 204 215 202 196 175   Blood Culture    Component Value Date/Time   SDES BLOOD RIGHT ANTECUBITAL 12/23/2018 1245   SPECREQUEST AEROBIC BOTTLE ONLY Blood Culture adequate volume 12/23/2018 1245   CULT  12/23/2018 1245    NO GROWTH 4 DAYS Performed at [facility], [place] [address], [place], [place] [address]    REPTSTATUS PENDING 12/23/2018 1245    Cardiac Enzymes: No results for input(s): CKTOTAL, CKMB, CKMBINDEX, TROPONINI in the last 168 hours. CBG: No results for input(s): GLUCAP in the  last 168 hours. Iron Studies: No results for input(s): IRON, TIBC, TRANSFERRIN, FERRITIN in the last 72 hours. Lab Results  Component Value Date   INR 2.64 12/28/2018   INR 3.13 12/27/2018   INR 3.97 12/26/2018   Studies/Results: Dg Chest 2 View  Result Date: 12/27/2018 CLINICAL DATA:  Sepsis. EXAM: CHEST - 2 VIEW COMPARISON:  12/25/2018. FINDINGS: Stable cardiomegaly. Improving bilateral pulmonary interstitial prominence consistent improving interstitial edema and/or pneumonitis. No pleural effusion or pneumothorax. No acute bony abnormality. IMPRESSION: 1.  Stable cardiomegaly. 2. Improving bilateral from interstitial prominence consistent improving interstitial edema and/or pneumonitis. Electronically Signed   By: Marcello Moores  Register   On: 12/27/2018 07:35   Medications: . ceFEPime (MAXIPIME) IV 1 g (12/27/18 1254)  . vancomycin 750 mg (12/26/18 1517)   . acetaminophen  1,000 mg Oral TID  . atorvastatin  20 mg Oral q1800  . chlorhexidine  15 mL Mouth Rinse BID  . Chlorhexidine Gluconate Cloth  6 each Topical Q0600  . Chlorhexidine Gluconate Cloth  6 each Topical Q0600  . [START ON 12/30/2018] darbepoetin (ARANESP) injection - DIALYSIS  150 mcg Intravenous Q Fri-HD  . doxercalciferol  2 mcg Intravenous Q M,W,F-HD  . erythromycin   Left Eye Q8H  . hydrocerin   Topical BID  . mouth rinse  15 mL Mouth Rinse q12n4p  . metoprolol tartrate  25 mg Oral BID  . midodrine  10 mg Oral Q M,W,F-HD  . multivitamin  1 tablet Oral QHS  . pantoprazole  40 mg Oral BID

## 2018-12-28 NOTE — Progress Notes (Signed)
  Progress Note    12/28/2018 9:54 AM * No surgery date entered *  Subjective:  On HD this morning, does not want amputations  Vitals:   12/28/18 0900 12/28/18 0930  BP: 112/65 (!) 116/58  Pulse: 98 95  Resp:    Temp:    SpO2:      Physical Exam: Awake and alert Dressings on feet are cdi  CBC    Component Value Date/Time   WBC 15.6 (H) 12/28/2018 0253   RBC 3.31 (L) 12/28/2018 0253   HGB 10.2 (L) 12/28/2018 0253   HCT 32.6 (L) 12/28/2018 0253   HCT 27.1 (L) 11/09/2018 0230   PLT 175 12/28/2018 0253   MCV 98.5 12/28/2018 0253   MCH 30.8 12/28/2018 0253   MCHC 31.3 12/28/2018 0253   RDW 18.1 (H) 12/28/2018 0253   LYMPHSABS 0.9 12/24/2018 0227   MONOABS 1.2 (H) 12/24/2018 0227   EOSABS 0.2 12/24/2018 0227   BASOSABS 0.1 12/24/2018 0227    BMET    Component Value Date/Time   NA 137 12/28/2018 0253   NA 144 12/22/2018   K 4.4 12/28/2018 0253   CL 99 12/28/2018 0253   CO2 25 12/28/2018 0253   GLUCOSE 138 (H) 12/28/2018 0253   BUN 56 (H) 12/28/2018 0253   BUN 65 (A) 12/22/2018   CREATININE 5.00 (H) 12/28/2018 0253   CALCIUM 9.1 12/28/2018 0253   CALCIUM 8.3 (L) 10/25/2008 1450   GFRNONAA 10 (L) 12/28/2018 0253   GFRAA 12 (L) 12/28/2018 0253    INR    Component Value Date/Time   INR 2.64 12/28/2018 0253     Intake/Output Summary (Last 24 hours) at 12/28/2018 0954 Last data filed at 12/27/2018 1700 Gross per 24 hour  Intake 720 ml  Output -  Net 720 ml     Assessment:  78 y.o. male is here with bilateral foot ulcerations  Plan: Tentative plan is for bilateral above-knee amputations tomorrow.  He will need vitamin K today and INR recheck in the morning.  N.p.o. past midnight.  I discussed with his daughter and he does not seem amenable this morning to proceeding with amputations although prior discussions he has been agreeable including with palliative care.  We will make final determination in the morning.   Brandon C. Donzetta Matters, MD [facility] Office: [phone] Pager: [phone]  12/28/2018 9:54 AM

## 2018-12-29 ENCOUNTER — Inpatient Hospital Stay (HOSPITAL_COMMUNITY): Payer: Medicare Other | Admitting: Anesthesiology

## 2018-12-29 ENCOUNTER — Encounter (HOSPITAL_COMMUNITY)
Admission: EM | Disposition: A | Discharge status: Skilled Nursing Facility | Payer: Self-pay | Source: Home / Self Care | Attending: Internal Medicine

## 2018-12-29 ENCOUNTER — Encounter (HOSPITAL_COMMUNITY): Payer: Self-pay

## 2018-12-29 DIAGNOSIS — I70245 Atherosclerosis of native arteries of left leg with ulceration of other part of foot: Secondary | ICD-10-CM

## 2018-12-29 DIAGNOSIS — I70234 Atherosclerosis of native arteries of right leg with ulceration of heel and midfoot: Secondary | ICD-10-CM

## 2018-12-29 HISTORY — PX: AMPUTATION: SHX166

## 2018-12-29 LAB — RENAL FUNCTION PANEL
Albumin: 2.4 g/dL — ABNORMAL LOW (ref 3.5–5.0)
Anion gap: 10 (ref 5–15)
BUN: 30 mg/dL — ABNORMAL HIGH (ref 8–23)
CO2: 29 mmol/L (ref 22–32)
Calcium: 8.6 mg/dL — ABNORMAL LOW (ref 8.9–10.3)
Chloride: 101 mmol/L (ref 98–111)
Creatinine, Ser: 3.52 mg/dL — ABNORMAL HIGH (ref 0.61–1.24)
GFR calc Af Amer: 18 mL/min — ABNORMAL LOW (ref >60)
GFR calc non Af Amer: 16 mL/min — ABNORMAL LOW (ref >60)
Glucose, Bld: 139 mg/dL — ABNORMAL HIGH (ref 70–99)
Phosphorus: 2.3 mg/dL — ABNORMAL LOW (ref 2.5–4.6)
Potassium: 3.7 mmol/L (ref 3.5–5.1)
Sodium: 140 mmol/L (ref 135–145)

## 2018-12-29 LAB — PROTIME-INR
INR: 2.11
Prothrombin Time: 23.4 seconds — ABNORMAL HIGH (ref 11.4–15.2)

## 2018-12-29 LAB — CBC
HCT: 32.7 % — ABNORMAL LOW (ref 39.0–52.0)
Hemoglobin: 10 g/dL — ABNORMAL LOW (ref 13.0–17.0)
MCH: 29.8 pg (ref 26.0–34.0)
MCHC: 30.6 g/dL (ref 30.0–36.0)
MCV: 97.3 fL (ref 80.0–100.0)
Platelets: 159 10*3/uL (ref 150–400)
RBC: 3.36 MIL/uL — ABNORMAL LOW (ref 4.22–5.81)
RDW: 18.2 % — ABNORMAL HIGH (ref 11.5–15.5)
WBC: 14.8 10*3/uL — ABNORMAL HIGH (ref 4.0–10.5)
nRBC: 0 % (ref 0.0–0.2)

## 2018-12-29 LAB — GLUCOSE, CAPILLARY: Glucose-Capillary: 108 mg/dL — ABNORMAL HIGH (ref 70–99)

## 2018-12-29 SURGERY — AMPUTATION, ABOVE KNEE
Anesthesia: General | Laterality: Bilateral

## 2018-12-29 MED ORDER — ONDANSETRON HCL 4 MG/2ML IJ SOLN
INTRAMUSCULAR | Status: DC | PRN
  Administered 2018-12-29: 4 mg via INTRAVENOUS

## 2018-12-29 MED ORDER — SODIUM CHLORIDE 0.9 % IV SOLN
INTRAVENOUS | Status: DC | PRN
  Administered 2018-12-29: 14:00:00 via INTRAVENOUS

## 2018-12-29 MED ORDER — FENTANYL CITRATE (PF) 100 MCG/2ML IJ SOLN
INTRAMUSCULAR | Status: DC | PRN
  Administered 2018-12-29: 100 ug via INTRAVENOUS
  Administered 2018-12-29: 50 ug via INTRAVENOUS

## 2018-12-29 MED ORDER — OXYCODONE HCL 5 MG PO TABS: 5.0000 mg | ORAL_TABLET | Freq: Once | ORAL | Status: DC | PRN

## 2018-12-29 MED ORDER — PROPOFOL 10 MG/ML IV BOLUS
INTRAVENOUS | Status: DC | PRN
  Administered 2018-12-29: 100 mg via INTRAVENOUS

## 2018-12-29 MED ORDER — PROPOFOL 10 MG/ML IV BOLUS
INTRAVENOUS | Status: AC
  Filled 2018-12-29: qty 20

## 2018-12-29 MED ORDER — DEXAMETHASONE SODIUM PHOSPHATE 4 MG/ML IJ SOLN
INTRAMUSCULAR | Status: DC | PRN
  Administered 2018-12-29: 4 mg via INTRAVENOUS

## 2018-12-29 MED ORDER — ONDANSETRON HCL 4 MG/2ML IJ SOLN
INTRAMUSCULAR | Status: AC
  Filled 2018-12-29: qty 2

## 2018-12-29 MED ORDER — LORAZEPAM 2 MG/ML IJ SOLN
0.5000 mg | Freq: Once | INTRAMUSCULAR | Status: AC
  Administered 2018-12-29: 0.5 mg via INTRAVENOUS
  Filled 2018-12-29: qty 1

## 2018-12-29 MED ORDER — FENTANYL CITRATE (PF) 250 MCG/5ML IJ SOLN
INTRAMUSCULAR | Status: AC
  Filled 2018-12-29: qty 5

## 2018-12-29 MED ORDER — OXYCODONE HCL 5 MG/5ML PO SOLN: 5.0000 mg | Freq: Once | ORAL | Status: DC | PRN

## 2018-12-29 MED ORDER — EPHEDRINE 5 MG/ML INJ
INTRAVENOUS | Status: AC
  Filled 2018-12-29: qty 10

## 2018-12-29 MED ORDER — SUGAMMADEX SODIUM 200 MG/2ML IV SOLN
INTRAVENOUS | Status: DC | PRN
  Administered 2018-12-29: 100 mg via INTRAVENOUS

## 2018-12-29 MED ORDER — LIDOCAINE 2% (20 MG/ML) 5 ML SYRINGE
INTRAMUSCULAR | Status: AC
  Filled 2018-12-29: qty 5

## 2018-12-29 MED ORDER — MORPHINE SULFATE (PF) 2 MG/ML IV SOLN
2.0000 mg | Freq: Once | INTRAVENOUS | Status: AC
  Administered 2018-12-29: 2 mg via INTRAVENOUS
  Filled 2018-12-29: qty 1

## 2018-12-29 MED ORDER — EPHEDRINE SULFATE-NACL 50-0.9 MG/10ML-% IV SOSY
PREFILLED_SYRINGE | INTRAVENOUS | Status: DC | PRN
  Administered 2018-12-29: 10 mg via INTRAVENOUS

## 2018-12-29 MED ORDER — ROCURONIUM BROMIDE 10 MG/ML (PF) SYRINGE
PREFILLED_SYRINGE | INTRAVENOUS | Status: DC | PRN
  Administered 2018-12-29: 40 mg via INTRAVENOUS

## 2018-12-29 MED ORDER — ROCURONIUM BROMIDE 50 MG/5ML IV SOSY
PREFILLED_SYRINGE | INTRAVENOUS | Status: AC
  Filled 2018-12-29: qty 5

## 2018-12-29 MED ORDER — DEXAMETHASONE SODIUM PHOSPHATE 10 MG/ML IJ SOLN
INTRAMUSCULAR | Status: AC
  Filled 2018-12-29: qty 1

## 2018-12-29 MED ORDER — ONDANSETRON HCL 4 MG/2ML IJ SOLN: 4.0000 mg | Freq: Once | INTRAMUSCULAR | Status: DC | PRN

## 2018-12-29 MED ORDER — ETOMIDATE 2 MG/ML IV SOLN
INTRAVENOUS | Status: DC | PRN
  Administered 2018-12-29: 14 mg via INTRAVENOUS

## 2018-12-29 MED ORDER — FENTANYL CITRATE (PF) 100 MCG/2ML IJ SOLN: 25.0000 ug | INTRAMUSCULAR | Status: DC | PRN

## 2018-12-29 MED ORDER — 0.9 % SODIUM CHLORIDE (POUR BTL) OPTIME
TOPICAL | Status: DC | PRN
  Administered 2018-12-29 (×3): 1000 mL

## 2018-12-29 MED ORDER — SODIUM CHLORIDE 0.9 % IV SOLN
INTRAVENOUS | Status: DC
  Administered 2018-12-29: 09:00:00 via INTRAVENOUS

## 2018-12-29 SURGICAL SUPPLY — 55 items
BANDAGE ACE 4X5 VEL STRL LF (GAUZE/BANDAGES/DRESSINGS) ×5 IMPLANT
BANDAGE ACE 6X5 VEL STRL LF (GAUZE/BANDAGES/DRESSINGS) ×3 IMPLANT
BANDAGE ESMARK 6X9 LF (GAUZE/BANDAGES/DRESSINGS) ×1 IMPLANT
BLADE SAGITTAL (BLADE)
BLADE SAGITTAL 25.0X1.19X90 (BLADE) IMPLANT
BLADE SAGITTAL 25.0X1.19X90MM (BLADE)
BLADE SAW GIGLI 510 (BLADE) ×2 IMPLANT
BLADE SAW GIGLI 510MM (BLADE) ×1
BLADE SAW THK.89X75X18XSGTL (BLADE) IMPLANT
BNDG CMPR 9X6 STRL LF SNTH (GAUZE/BANDAGES/DRESSINGS) ×1
BNDG CMPR MED 15X6 ELC VLCR LF (GAUZE/BANDAGES/DRESSINGS) ×2
BNDG COHESIVE 6X5 TAN STRL LF (GAUZE/BANDAGES/DRESSINGS) ×5 IMPLANT
BNDG ELASTIC 6X15 VLCR STRL LF (GAUZE/BANDAGES/DRESSINGS) ×4 IMPLANT
BNDG ESMARK 6X9 LF (GAUZE/BANDAGES/DRESSINGS) ×3
BNDG GAUZE ELAST 4 BULKY (GAUZE/BANDAGES/DRESSINGS) ×7 IMPLANT
CANISTER SUCT 3000ML PPV (MISCELLANEOUS) ×3 IMPLANT
CLIP VESOCCLUDE MED 6/CT (CLIP) ×3 IMPLANT
COVER SURGICAL LIGHT HANDLE (MISCELLANEOUS) ×3 IMPLANT
COVER WAND RF STERILE (DRAPES) ×3 IMPLANT
CUFF TOURNIQUET SINGLE 24IN (TOURNIQUET CUFF) IMPLANT
CUFF TOURNIQUET SINGLE 34IN LL (TOURNIQUET CUFF) ×4 IMPLANT
DRAIN CHANNEL 19F RND (DRAIN) IMPLANT
DRAPE EXTREMITY BILATERAL (DRAPES) ×2 IMPLANT
DRAPE HALF SHEET 40X57 (DRAPES) ×5 IMPLANT
DRAPE ORTHO SPLIT 77X108 STRL (DRAPES)
DRAPE SURG ORHT 6 SPLT 77X108 (DRAPES) ×2 IMPLANT
DRSG ADAPTIC 3X8 NADH LF (GAUZE/BANDAGES/DRESSINGS) ×3 IMPLANT
ELECT CAUTERY BLADE 6.4 (BLADE) ×3 IMPLANT
ELECT REM PT RETURN 9FT ADLT (ELECTROSURGICAL) ×3
ELECTRODE REM PT RTRN 9FT ADLT (ELECTROSURGICAL) ×1 IMPLANT
EVACUATOR SILICONE 100CC (DRAIN) IMPLANT
GAUZE SPONGE 4X4 12PLY STRL (GAUZE/BANDAGES/DRESSINGS) ×5 IMPLANT
GLOVE BIO SURGEON STRL SZ7.5 (GLOVE) ×3 IMPLANT
GOWN STRL REUS W/ TWL LRG LVL3 (GOWN DISPOSABLE) ×2 IMPLANT
GOWN STRL REUS W/ TWL XL LVL3 (GOWN DISPOSABLE) ×1 IMPLANT
GOWN STRL REUS W/TWL LRG LVL3 (GOWN DISPOSABLE) ×6
GOWN STRL REUS W/TWL XL LVL3 (GOWN DISPOSABLE) ×3
KIT BASIN OR (CUSTOM PROCEDURE TRAY) ×3 IMPLANT
KIT TURNOVER KIT B (KITS) ×3 IMPLANT
NS IRRIG 1000ML POUR BTL (IV SOLUTION) ×3 IMPLANT
PACK GENERAL/GYN (CUSTOM PROCEDURE TRAY) ×3 IMPLANT
PAD ABD 8X10 STRL (GAUZE/BANDAGES/DRESSINGS) ×2 IMPLANT
PAD ARMBOARD 7.5X6 YLW CONV (MISCELLANEOUS) ×6 IMPLANT
STAPLER VISISTAT 35W (STAPLE) ×3 IMPLANT
STOCKINETTE IMPERVIOUS LG (DRAPES) ×5 IMPLANT
SUT ETHILON 3 0 PS 1 (SUTURE) IMPLANT
SUT SILK 0 TIES 10X30 (SUTURE) ×3 IMPLANT
SUT SILK 2 0 (SUTURE) ×3
SUT SILK 2-0 18XBRD TIE 12 (SUTURE) ×1 IMPLANT
SUT SILK 3 0 (SUTURE)
SUT SILK 3-0 18XBRD TIE 12 (SUTURE) IMPLANT
SUT VIC AB 2-0 CT1 18 (SUTURE) ×14 IMPLANT
TOWEL GREEN STERILE (TOWEL DISPOSABLE) ×6 IMPLANT
UNDERPAD 30X30 (UNDERPADS AND DIAPERS) ×3 IMPLANT
WATER STERILE IRR 1000ML POUR (IV SOLUTION) ×3 IMPLANT

## 2018-12-29 NOTE — Progress Notes (Addendum)
[facility] Progress Note   Subjective: Seen in PACU post bilateral AKAs. Afib RVR on monitor. Minimally responsive but seems to follow simple commands.    Objective Vitals:   12/29/18 1529 12/29/18 1530 12/29/18 1543 12/29/18 1558  BP: 123/60  135/82 120/61  Pulse: (!) 108 (!) 111 (!) 135 (!) 134  Resp:   (!) 26 16  Temp: 98.1 F (36.7 C)     TempSrc:      SpO2: 96% 96% 97% 95%  Weight:      Height:       Physical Exam General: Chronically ill appearing male in NAD Heart: [id number]-J6,R6 2/6 systolic M. No JVD.  Lungs: slightly decreased in bases otherwise CTAB Abdomen: NT,ND Extremities: Bilateral AKA-ace wraps in place Dialysis Access: L AVF + bruit    Additional Objective Labs: Basic Metabolic Panel: Recent Labs  Lab 12/27/18 0248 12/28/18 0253 12/29/18 0209  NA 137 137 140  K 4.5 4.4 3.7  CL 98 99 101  CO2 26 25 29   GLUCOSE 200* 138* 139*  BUN 27* 56* 30*  CREATININE 3.13* 5.00* 3.52*  CALCIUM 8.8* 9.1 8.6*  PHOS 1.8* 2.4* 2.3*   Liver Function Tests: Recent Labs  Lab 12/23/18 1128  12/27/18 0248 12/28/18 0253 12/29/18 0209  AST 26  --   --   --   --   ALT 20  --   --   --   --   ALKPHOS 290*  --   --   --   --   BILITOT 0.9  --   --   --   --   PROT 7.0  --   --   --   --   ALBUMIN 2.4*   < > 2.3* 2.4* 2.4*   < > = values in this interval not displayed.   No results for input(s): LIPASE, AMYLASE in the last 168 hours. CBC: Recent Labs  Lab 12/23/18 1128 12/24/18 0227 12/25/18 0254 12/26/18 0243 12/27/18 0248 12/28/18 0253 12/29/18 0209  WBC 17.9* 15.6* 12.5* 12.3* 14.1* 15.6* 14.8*  NEUTROABS 15.5* 13.0*  --   --   --   --   --   HGB 9.5* 10.5* 9.6* 9.7* 10.0* 10.2* 10.0*  HCT 31.7* 33.5* 31.2* 32.3* 32.5* 32.6* 32.7*  MCV 100.6* 99.4 101.0* 99.1 100.9* 98.5 97.3  PLT 223 204 215 202 196 175 159   Blood Culture    Component Value Date/Time   SDES BLOOD RIGHT ANTECUBITAL 12/23/2018 1245   SPECREQUEST AEROBIC BOTTLE  ONLY Blood Culture adequate volume 12/23/2018 1245   CULT  12/23/2018 1245    NO GROWTH 5 DAYS Performed at [facility], [place] [address], [place], [place] [address]    REPTSTATUS 12/28/2018 FINAL 12/23/2018 1245    Cardiac Enzymes: No results for input(s): CKTOTAL, CKMB, CKMBINDEX, TROPONINI in the last 168 hours. CBG: No results for input(s): GLUCAP in the last 168 hours. Iron Studies: No results for input(s): IRON, TIBC, TRANSFERRIN, FERRITIN in the last 72 hours. @lablastinr3 @ Studies/Results: No results found. Medications: . [MAR Hold] ceFEPime (MAXIPIME) IV 1 g (12/29/18 1150)  . [MAR Hold] vancomycin 750 mg (12/28/18 1029)   . [MAR Hold] acetaminophen  1,000 mg Oral TID  . [MAR Hold] atorvastatin  20 mg Oral q1800  . [MAR Hold] chlorhexidine  15 mL Mouth Rinse BID  . [MAR Hold] Chlorhexidine Gluconate Cloth  6 each Topical Q0600  . [MAR Hold] Chlorhexidine Gluconate Cloth  6 each Topical  [id number]  Doug Sou Hold] darbepoetin (ARANESP) injection - DIALYSIS  150 mcg Intravenous Q Fri-HD  . [MAR Hold] doxercalciferol  2 mcg Intravenous Q M,W,F-HD  . [MAR Hold] feeding supplement (NEPRO CARB STEADY)  237 mL Oral BID BM  . [MAR Hold] feeding supplement (PRO-STAT SUGAR FREE 64)  30 mL Oral BID  . [MAR Hold] hydrocerin   Topical BID  . [MAR Hold] mouth rinse  15 mL Mouth Rinse q12n4p  . [MAR Hold] metoprolol tartrate  25 mg Oral BID  . [MAR Hold] midodrine  10 mg Oral Q M,W,F-HD  . [MAR Hold] multivitamin  1 tablet Oral QHS  . [MAR Hold] pantoprazole  40 mg Oral BID   Dialysis Orders:  AF MWF 2K 2.25 Ca EDW 69.5 400/A 1.5 profile 2 var NA linear 4hr lt AVF Hect 73mcg IV qtx Mircera 200 mcg q2weeks (last 12/18/18)   No heparin  Assessment/Plan:  1. Fever /L foot amputation site drainage  and right heel eschar/S/P Bilateral AKA today. Per VVS/Primary.   2. Afib RVR post OP-HR 130-140s. Per primary 3. ESRD MWF. HD tomorrow on schedule. No heparin. K+ 3.7 Use 4.0 K bath.  4.  Anemia hgb 10.0 - Aranesp 150 ordered for Friday HD 5. 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 Ca 8.6 C Ca 9.9 Hold VDRA.   5 Goals of care -appreciate palliative care conversations with family - DNR yet family wishes to proceed with bilateral AKA MOST form completed- already at SNF.  6 Dementia at baseline, made worse by acute illness.  7 DM controlled 9 BP/volume-HD 12/28/18 Net UF 1.7 L post wt 67.8 kg. Now under OP EDW. Will need lower EDW post AKAs. BP stable despite arrhythmia.  10 Malnutrition - Albumin 2.4 - add Nepro/prostat - may need to liberalize diet after surgery to promote intake  Giovoni Bunch H. Nikky Duba NP-C 12/29/2018, 4:20 PM  [employer] [phone]

## 2018-12-29 NOTE — Anesthesia Procedure Notes (Signed)
Procedure Name: Intubation Date/Time: 12/29/2018 2:11 PM Performed by: Eulas Post, Kaleeah Gingerich W, CRNA Pre-anesthesia Checklist: Patient identified, Emergency Drugs available, Suction available and Patient being monitored Patient Re-evaluated:Patient Re-evaluated prior to induction Oxygen Delivery Method: Circle system utilized Preoxygenation: Pre-oxygenation with 100% oxygen Induction Type: IV induction Ventilation: Mask ventilation without difficulty Laryngoscope Size: Miller and 2 Grade View: Grade I Tube type: Oral Tube size: 7.5 mm Number of attempts: 1 Airway Equipment and Method: Stylet and Oral airway Placement Confirmation: ETT inserted through vocal cords under direct vision,  positive ETCO2 and breath sounds checked- equal and bilateral Secured at: 23 cm Tube secured with: Tape Dental Injury: Teeth and Oropharynx as per pre-operative assessment

## 2018-12-29 NOTE — Progress Notes (Signed)
  Progress Note    12/29/2018 1:22 PM Day of Surgery  Subjective: He has no overnight complaints  Vitals:   12/28/18 2210 12/29/18 0731  BP: 123/79 106/72  Pulse: (!) 106   Resp: 18 19  Temp: 98.3 F (36.8 C) 97.8 F (36.6 C)  SpO2: 92%     Physical Exam: Awake and alert Nonlabored respirations Dressings on bilateral feet clean dry intact  CBC    Component Value Date/Time   WBC 14.8 (H) 12/29/2018 0209   RBC 3.36 (L) 12/29/2018 0209   HGB 10.0 (L) 12/29/2018 0209   HCT 32.7 (L) 12/29/2018 0209   HCT 27.1 (L) 11/09/2018 0230   PLT 159 12/29/2018 0209   MCV 97.3 12/29/2018 0209   MCH 29.8 12/29/2018 0209   MCHC 30.6 12/29/2018 0209   RDW 18.2 (H) 12/29/2018 0209   LYMPHSABS 0.9 12/24/2018 0227   MONOABS 1.2 (H) 12/24/2018 0227   EOSABS 0.2 12/24/2018 0227   BASOSABS 0.1 12/24/2018 0227    BMET    Component Value Date/Time   NA 140 12/29/2018 0209   NA 144 12/22/2018   K 3.7 12/29/2018 0209   CL 101 12/29/2018 0209   CO2 29 12/29/2018 0209   GLUCOSE 139 (H) 12/29/2018 0209   BUN 30 (H) 12/29/2018 0209   BUN 65 (A) 12/22/2018   CREATININE 3.52 (H) 12/29/2018 0209   CALCIUM 8.6 (L) 12/29/2018 0209   CALCIUM 8.3 (L) 10/25/2008 1450   GFRNONAA 16 (L) 12/29/2018 0209   GFRAA 18 (L) 12/29/2018 0209    INR    Component Value Date/Time   INR 2.11 12/29/2018 0209    No intake or output data in the 24 hours ending 12/29/18 1322   Assessment/plan:  78 y.o. male is here with bilateral foot ulceration without any reconstructable lesions indicated for bilateral above-knee amputations.  This is been discussed with the family on multiple occasions including this morning.  At this time he is amenable to proceeding.  Consent is signed.   Aitanna Haubner C. Donzetta Matters, MD [facility] Office: [phone] Pager: [phone]  12/29/2018 1:22 PM

## 2018-12-29 NOTE — Progress Notes (Signed)
Pt restrained unable to go on CPAP.

## 2018-12-29 NOTE — Progress Notes (Signed)
Pharmacy Antibiotic Note  [name] is a 78 y.o. male admitted on 12/23/2018 with pneumonia.  Pharmacy has been consulted for cefepime and vancomycin dosing with concern for PNA and infected foot. Pt has a history of ESRD on HD usually MWF, last HD 1/1. 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 Blood cultures no growth thus far.  Plan: -Cont Vancomycin 750 mg/HD-MWF -Cont Cefepime 1g IV every 24 hours -Will continue to follow HD schedule/duration, culture results, LOT, and antibiotic de-escalation plans   Height: 5\' 6"  (167.6 cm) Weight: 149 lb 7.6 oz (67.8 kg) IBW/kg (Calculated) : 63.8  Temp (24hrs), Avg:98.1 F (36.7 C), Min:97.8 F (36.6 C), Max:98.3 F (36.8 C)  Recent Labs  Lab 12/23/18 1148  12/25/18 0254 12/26/18 0243 12/27/18 0248 12/28/18 0253 12/29/18 0209  WBC  --    < > 12.5* 12.3* 14.1* 15.6* 14.8*  CREATININE  --    < > 3.19* 4.95* 3.13* 5.00* 3.52*  LATICACIDVEN 1.82  --   --   --   --   --   --    < > = values in this interval not displayed.    Estimated Creatinine Clearance: 15.6 mL/min (A) (by C-G formula based on SCr of 3.52 mg/dL (H)).    Allergies  Allergen Reactions  . Occlusive Silicone Sheets [Silicone]   . Other     Occlusive adhesive  . Tape Itching and Other (See Comments)    Cloth tape only    Antimicrobials this admission: Vanc 12/27>> Cefepime 12/27>> LVQ 12/29 x 1  Dose adjustments this admission: n/a  Microbiology results: 12/27 MRSA PCR >> neg 12/27 BCx >> ngtd  [name], PharmD, BCPS Clinical Pharmacist [phone] Please check AMION for all [facility] Pharmacy numbers 12/29/2018

## 2018-12-29 NOTE — Progress Notes (Signed)
PROGRESS NOTE    [name]   ZOX:[id number]  DOB: 1941-01-22  DOA: 12/23/2018 PCP: Hennie Duos, MD   Brief Narrative:    [name] is a 78 y.o. male with medical history significant for dementia, ESRD on HD; DM; OSA on CPAP; HTN; HLD; CHF; PVD with L 5th ray amputation on 12/10; and afib on Coumadin presenting with weakness. He has dementia and is unable to give history.  Sent from [place] for subjective fevers, weakness. He underwent left fifth toe amputation on 12/10 secondary to gangrene. The staff spoke with Vascular surgery and was recommended to send patient to ED. He had received a dose of IM Ceftraixone the day before.  EMS reports the patient was also noted to be short of breath on arrival. He was on 3L [place]. In ED, diagnosed with multifocal pneumonia and started on Vancomycin, Levaquin and Cefepime. In ED > ED, WBC 17.9, T-99 rectal, BP 115/63, HR 103. CXR shows Asymmetric airspace pulmonary edema versus multifocal bilateral pneumonia, R>L. BC X 2 ordered.  Was dialyzed overnight as he was volume overloaded per nephro.  Subjective: No complaints today. He states he is waiting for his legs to be cut off. Picking at IV which is now bleeding.     Assessment & Plan:   Principal Problem:   Sepsis> Subjective fevers, leukocytosis, acute respiratory failure, tachycardia - WBC 17 on admission - ?HCAP vs infected post op foot wound (foul smelling with discharge) suspected as a source of infection -   blood cultures neg - he was dialyzed x 2 in the first 24 hrs to remove excess fluid but continued to have b/l infiltrates on CXR- continues to have coarse RLL crackles-   -   Pulse ox now 100% on room air- WBC count improving- has received a 7 day course of antibiotics which should be sufficient to treat for pneumonia -  Would recommend antibiotics be stopped after the amputations   Active Problems:  Gangrene of the foot, PVD - s/p left superficial femoral artery  occlusion and posterior tibial angioplasty with Dr. Trula Slade on 11/15/2018 - 12/10 Ray amputation left fifth toe- dr Scot Dock - b/l foot wounds noted  - Vascular surgery recommended palliative care vs b/l amputations - palliative care consulted on 12/28   -after palliative care consult on 12/31,  the patient and his family would like to proceed with amputations- this is planned for today  ESRD on hemodialysis / fluid overloaded - d/w Dr Deterding who feels he was fluid overloaded on admission- back to back dialysis x 2 done - on Midodrine with  dialysis    PAF (paroxysmal atrial fibrillation)/ coagulopathy - INR ~8 on admission- steadily improving - - holding coumadin- given Vit K for OR- INR still ~ 2 today - Lopressor resumed- HR much better controlled     Dementia with acute encephalopathy due to infection  - seems improved from admission but continues to have significant memory deficits    OBSTRUCTIVE SLEEP APNEA - CPAP ordered  Conjuctivitis- left eye -  looks improved- will d/c erythromycin ointment today- please follow    GERD? PUD? - on BID Protonix as outpt - continue    DVT prophylaxis: INR still elevated Code Status: DNR Disposition: follow on telemetry Family Communication: Consultants:   Nephrology  Vascular surgery Procedures:   none Antimicrobials:  Anti-infectives (From admission, onward)   Start     Dose/Rate Route Frequency Ordered Stop   12/26/18 1200  vancomycin (  VANCOCIN) IVPB 750 mg/150 ml premix     750 mg 150 mL/hr over 60 Minutes Intravenous Every M-W-F (Hemodialysis) 12/23/18 1231     12/25/18 1800  levofloxacin (LEVAQUIN) IVPB 500 mg  Status:  Discontinued     500 mg 100 mL/hr over 60 Minutes Intravenous Every 48 hours 12/23/18 1805 12/25/18 1116   12/25/18 0745  vancomycin (VANCOCIN) IVPB 750 mg/150 ml premix     750 mg 150 mL/hr over 60 Minutes Intravenous  Once 12/25/18 0723 12/25/18 0953   12/24/18 1300  ceFEPIme (MAXIPIME) 1 g in  sodium chloride 0.9 % 100 mL IVPB     1 g 200 mL/hr over 30 Minutes Intravenous Every 24 hours 12/23/18 1230     12/24/18 0200  vancomycin (VANCOCIN) IVPB 750 mg/150 ml premix     750 mg 150 mL/hr over 60 Minutes Intravenous  Once 12/23/18 2049 12/24/18 0150   12/23/18 1815  levofloxacin (LEVAQUIN) IVPB 750 mg  Status:  Discontinued     750 mg 100 mL/hr over 90 Minutes Intravenous  Once 12/23/18 1805 12/25/18 1116   12/23/18 1245  vancomycin (VANCOCIN) 1,500 mg in sodium chloride 0.9 % 500 mL IVPB     1,500 mg 250 mL/hr over 120 Minutes Intravenous  Once 12/23/18 1230 12/23/18 1536   12/23/18 1245  ceFEPIme (MAXIPIME) 2 g in sodium chloride 0.9 % 100 mL IVPB     2 g 200 mL/hr over 30 Minutes Intravenous  Once 12/23/18 1230 12/23/18 1325       Objective: Vitals:   12/28/18 1130 12/28/18 1138 12/28/18 2210 12/29/18 0731  BP: 120/66 112/62 123/79 106/72  Pulse: 95 94 (!) 106   Resp:  18 18 19   Temp:  97.8 F (36.6 C) 98.3 F (36.8 C) 97.8 F (36.6 C)  TempSrc:  Oral Axillary Oral  SpO2:  100% 92%   Weight:  67.8 kg    Height:       No intake or output data in the 24 hours ending 12/29/18 1250 Filed Weights   12/26/18 1623 12/28/18 0731 12/28/18 1138  Weight: 69.9 kg 69.5 kg 67.8 kg    Examination: General exam: Appears comfortable - restless, pulling at IV HEENT: PERRL, mouth dry Respiratory system:  Faint RLL crackles - pulse ox 100%- no rhonchi or wheezing Cardiovascular system: S1 & S2 heard, IIRR,  2/6 murmur at LUS, RUS border and at apex Gastrointestinal system: Abdomen soft, non-tender, nondistended. Normal bowel sounds. Central nervous system: Alert-disoriented to time - No focal neurological deficits. Skin: very foul smelling wound left foot   Psychiatry:  Restless but pleasant      Data Reviewed: I have personally reviewed following labs and imaging studies  CBC: Recent Labs  Lab 12/23/18 1128 12/24/18 0227 12/25/18 0254 12/26/18 0243  12/27/18 0248 12/28/18 0253 12/29/18 0209  WBC 17.9* 15.6* 12.5* 12.3* 14.1* 15.6* 14.8*  NEUTROABS 15.5* 13.0*  --   --   --   --   --   HGB 9.5* 10.5* 9.6* 9.7* 10.0* 10.2* 10.0*  HCT 31.7* 33.5* 31.2* 32.3* 32.5* 32.6* 32.7*  MCV 100.6* 99.4 101.0* 99.1 100.9* 98.5 97.3  PLT 223 204 215 202 196 175 272   Basic Metabolic Panel: Recent Labs  Lab 12/25/18 0254 12/26/18 0243 12/27/18 0248 12/28/18 0253 12/29/18 0209  NA 140 135 137 137 140  K 3.7 4.1 4.5 4.4 3.7  CL 98 95* 98 99 101  CO2 29 24 26 25 29   GLUCOSE 137* 182*  200* 138* 139*  BUN 25* 53* 27* 56* 30*  CREATININE 3.19* 4.95* 3.13* 5.00* 3.52*  CALCIUM 9.2 9.1 8.8* 9.1 8.6*  PHOS 1.7* 2.3* 1.8* 2.4* 2.3*   GFR: Estimated Creatinine Clearance: 15.6 mL/min (A) (by C-G formula based on SCr of 3.52 mg/dL (H)). Liver Function Tests: Recent Labs  Lab 12/23/18 1128  12/25/18 0254 12/26/18 0243 12/27/18 0248 12/28/18 0253 12/29/18 0209  AST 26  --   --   --   --   --   --   ALT 20  --   --   --   --   --   --   ALKPHOS 290*  --   --   --   --   --   --   BILITOT 0.9  --   --   --   --   --   --   PROT 7.0  --   --   --   --   --   --   ALBUMIN 2.4*   < > 2.3* 2.4* 2.3* 2.4* 2.4*   < > = values in this interval not displayed.   No results for input(s): LIPASE, AMYLASE in the last 168 hours. No results for input(s): AMMONIA in the last 168 hours. Coagulation Profile: Recent Labs  Lab 12/25/18 0254 12/26/18 0243 12/27/18 0248 12/28/18 0253 12/29/18 0209  INR 4.05* 3.97 3.13 2.64 2.11   Cardiac Enzymes: No results for input(s): CKTOTAL, CKMB, CKMBINDEX, TROPONINI in the last 168 hours. BNP (last 3 results) No results for input(s): PROBNP in the last 8760 hours. HbA1C: No results for input(s): HGBA1C in the last 72 hours. CBG: No results for input(s): GLUCAP in the last 168 hours. Lipid Profile: No results for input(s): CHOL, HDL, LDLCALC, TRIG, CHOLHDL, LDLDIRECT in the last 72 hours. Thyroid Function  Tests: No results for input(s): TSH, T4TOTAL, FREET4, T3FREE, THYROIDAB in the last 72 hours. Anemia Panel: No results for input(s): VITAMINB12, FOLATE, FERRITIN, TIBC, IRON, RETICCTPCT in the last 72 hours. Urine analysis:    Component Value Date/Time   COLORURINE YELLOW 10/20/2008 [place] 10/20/2008 0942   LABSPEC 1.011 10/20/2008 0942   PHURINE 6.0 10/20/2008 0942   GLUCOSEU NEGATIVE 10/20/2008 0942   HGBUR LARGE (A) 10/20/2008 0942   BILIRUBINUR NEGATIVE 10/20/2008 0942   KETONESUR NEGATIVE 10/20/2008 0942   PROTEINUR 100 (A) 10/20/2008 0942   UROBILINOGEN 1.0 10/20/2008 0942   NITRITE NEGATIVE 10/20/2008 0942   LEUKOCYTESUR SMALL (A) 10/20/2008 0942   Sepsis Labs: @LABRCNTIP (procalcitonin:4,lacticidven:4) ) Recent Results (from the past 240 hour(s))  Blood Culture (routine x 2)     Status: None   Collection Time: 12/23/18 11:38 AM  Result Value Ref Range Status   Specimen Description BLOOD RIGHT ANTECUBITAL  Final   Special Requests   Final    BOTTLES DRAWN AEROBIC AND ANAEROBIC Blood Culture adequate volume   Culture   Final    NO GROWTH 5 DAYS Performed at [facility], [place] [address] [place], [place] [address]    Report Status 12/28/2018 FINAL  Final  Blood Culture (routine x 2)     Status: None   Collection Time: 12/23/18 12:45 PM  Result Value Ref Range Status   Specimen Description BLOOD RIGHT ANTECUBITAL  Final   Special Requests AEROBIC BOTTLE ONLY Blood Culture adequate volume  Final   Culture   Final    NO GROWTH 5 DAYS Performed at [facility], [address] [place], [place] [address]    Report Status 12/28/2018 FINAL  Final  MRSA PCR Screening     Status: None   Collection Time: 12/23/18  5:08 PM  Result Value Ref Range Status   MRSA by PCR NEGATIVE NEGATIVE Final    Comment:        The GeneXpert MRSA Assay (FDA approved for NASAL specimens only), is one component of a comprehensive MRSA colonization surveillance  program. It is not intended to diagnose MRSA infection nor to guide or monitor treatment for MRSA infections. Performed at [facility], [place] [address], [place], [place] [address]          Radiology Studies: No results found.    Scheduled Meds: . acetaminophen  1,000 mg Oral TID  . atorvastatin  20 mg Oral q1800  . chlorhexidine  15 mL Mouth Rinse BID  . Chlorhexidine Gluconate Cloth  6 each Topical Q0600  . Chlorhexidine Gluconate Cloth  6 each Topical Q0600  . [START ON 12/30/2018] darbepoetin (ARANESP) injection - DIALYSIS  150 mcg Intravenous Q Fri-HD  . doxercalciferol  2 mcg Intravenous Q M,W,F-HD  . erythromycin   Left Eye Q8H  . feeding supplement (NEPRO CARB STEADY)  237 mL Oral BID BM  . feeding supplement (PRO-STAT SUGAR FREE 64)  30 mL Oral BID  . hydrocerin   Topical BID  . mouth rinse  15 mL Mouth Rinse q12n4p  . metoprolol tartrate  25 mg Oral BID  . midodrine  10 mg Oral Q M,W,F-HD  . multivitamin  1 tablet Oral QHS  . pantoprazole  40 mg Oral BID   Continuous Infusions: . sodium chloride 10 mL/hr at 12/29/18 0907  . ceFEPime (MAXIPIME) IV 1 g (12/29/18 1150)  . vancomycin 750 mg (12/28/18 1029)     LOS: 6 days    Time spent in minutes: 11 min    Debbe Odea, MD [facility] Pager: [email or web address] Password [email or web address] 12/29/2018, 12:50 PM

## 2018-12-29 NOTE — Anesthesia Preprocedure Evaluation (Addendum)
Anesthesia Evaluation  Patient identified by MRN, date of birth, ID band Patient confused    Reviewed: Allergy & Precautions, NPO status , Patient's Chart, lab work & pertinent test results, reviewed documented beta blocker date and time   History of Anesthesia Complications Negative for: history of anesthetic complications  Airway Mallampati: II  TM Distance: >3 FB Neck ROM: Full    Dental  (+) Poor Dentition, Missing Multiple missing teeth and overall poor dentition; per patient no loose teeth:   Pulmonary sleep apnea and Continuous Positive Airway Pressure Ventilation , pneumonia, former smoker,    breath sounds clear to auscultation       Cardiovascular hypertension, Pt. on medications and Pt. 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), OSA on CPAP, HTN, A fib, moderate aortic stenosis, right HF/pulm HTN, PVD s/p 5th ray amp, GERD, ESRD on HD, DM  - admitted 12/23/18 with encephalopathy & acute respiratory failure 2/2 multifocal pneumonia and concern for sepsis; currently on vanc/cefepime, back to room air, encephalopathy improved  - TTE 11/08/18: EF 60-65%, mod-severe RV systolic dysfunction, moderate AS (valve area 0.9 cm2), mild-mod TR, PASP 67 mmHg; aortic valve gradients may be underestimated - K 3.7, Hgb 10, INR 2.1  Reproductive/Obstetrics                           Anesthesia Physical Anesthesia  Plan  ASA: IV  Anesthesia Plan: General   Post-op Pain Management:    Induction: Intravenous  PONV Risk Score and Plan: 2 and Ondansetron, Dexamethasone and Treatment may vary due to age or medical condition  Airway Management Planned: Oral ETT  Additional Equipment: Arterial line  Intra-op Plan:   Post-operative Plan: Extubation in OR and Possible Post-op intubation/ventilation  Informed Consent: I have reviewed the patients History and Physical, chart, labs and discussed the procedure including the risks, benefits and alternatives for the proposed anesthesia with the patient or authorized representative who has indicated his/her understanding and acceptance.     Plan Discussed with:   Anesthesia Plan Comments: (Discussed DNR with patient's daughter, and she told me that she has discussed this extensively with other doctors and wishes for him to remain DNR while in the OR. I explained to her that he will require intubation and likely some vasopressor support to undergo anesthesia, and she understands and agrees to this, but does not wish for him to have chest compressions even in the perioperative period.)      Anesthesia Quick Evaluation

## 2018-12-29 NOTE — Anesthesia Procedure Notes (Signed)
Arterial Line Insertion Start/End1/01/2019 1:45 PM, 12/29/2018 1:50 PM Performed by: Eulas Post, Jaquanda Wickersham W, CRNA, CRNA  Preanesthetic checklist: patient identified, IV checked, site marked, risks and benefits discussed, surgical consent, monitors and equipment checked, pre-op evaluation, timeout performed and anesthesia consent Lidocaine 1% used for infiltration Right, radial was placed Catheter size: 20 G Hand hygiene performed  and maximum sterile barriers used   Attempts: 1 Procedure performed without using ultrasound guided technique. Following insertion, dressing applied and Biopatch. Post procedure assessment: normal

## 2018-12-29 NOTE — Transfer of Care (Signed)
Immediate Anesthesia Transfer of Care Note  Patient: [name]  Procedure(s) Performed: AMPUTATION ABOVE KNEE (Bilateral )  Patient Location: PACU  Anesthesia Type:General  Level of Consciousness: awake, oriented and patient cooperative  Airway & Oxygen Therapy: Patient Spontanous Breathing and Patient connected to nasal cannula oxygen  Post-op Assessment: Report given to RN, Post -op Vital signs reviewed and stable and Patient moving all extremities  Post vital signs: Reviewed and stable  Last Vitals:  Vitals Value Taken Time  BP 123/60 12/29/2018  3:29 PM  Temp    Pulse 120 12/29/2018  3:33 PM  Resp 22 12/29/2018  3:33 PM  SpO2 95 % 12/29/2018  3:33 PM  Vitals shown include unvalidated device data.  Last Pain:  Vitals:   12/29/18 0731  TempSrc: Oral  PainSc:          Complications: No apparent anesthesia complications

## 2018-12-29 NOTE — Anesthesia Procedure Notes (Signed)
Arterial Line Insertion Performed by: Valda Favia, CRNA, CRNA  Patient location: Pre-op. Preanesthetic checklist: patient identified, IV checked, site marked, risks and benefits discussed, surgical consent, monitors and equipment checked, pre-op evaluation, timeout performed and anesthesia consent Right, radial was placed Catheter size: 20 G Hand hygiene performed , maximum sterile barriers used  and Seldinger technique used Allen's test indicative of satisfactory collateral circulation Attempts: 1 Procedure performed without using ultrasound guided technique. Following insertion, dressing applied and Biopatch. Post procedure assessment: normal  Patient tolerated the procedure well with no immediate complications.

## 2018-12-29 NOTE — Op Note (Signed)
    Patient name: [name] MRN: [id number] DOB: Aug 10, 1941 Sex: male  12/29/2018 Pre-operative Diagnosis: Bilateral foot ulceration Post-operative diagnosis:  Same Surgeon:  Erlene Quan C. Donzetta Matters, MD Procedure Performed: Bilateral above-knee amputations  Indications: 78 year old male with history of dialysis and dementia now has left toe amputation site that has broken down and has infected appearance as well as a right heel ulceration.  He is now indicated for bilateral below-knee amputations after discussion with palliative care.  Findings: There is adequate bleeding in both wound beds to suggest healing.   Procedure:  The patient was identified in the holding area and taken to the operating room where is placed to plan operative table and general anesthesia was induced.  He was sterilely prepped draped the bilateral lower extremities antibiotics were administered and timeout was called.  We began first with the right leg making a fishmouth incision just above the knee.  We dissected down to the femur and raised the periosteum.  Femur was transected with Gigli saw.  Posterior flap was created with amputation knife and the blood vessels were clamped and suture ligated.  Nerve was pulled on tension ligated with Vicryl tie and divided.  We obtained hemostasis irrigated the wound.  The bone was smoothed with a rasp fascia was reapproximated with interrupted 2-0 Vicryl sutures.  Attention was then turned to the left leg where a similar type ends procedure was undertaken.  Fishmouth incision was performed dissected with cautery down to the level of the femur and the periosteum was raised.  Femur was transected with Gigli saw.  Bone was smoothed with rasp.  Posterior flap was created with amputation knife vessels were clamped and suture ligated with 2-0 Vicryl stitch.  Nerve was pulled on tension ligated with Vicryl tie and divided.  Hemostasis was obtained and the wound was irrigated thoroughly.  Fascia was  reapproximated interrupted 2-0 Vicryl suture.  Both wounds were closed with staples and sterile dressings were applied.  He tolerated procedure well without immediate complication.  EBL: 200 cc   Antwyne Pingree C. Donzetta Matters, MD [facility] Office: [phone] Pager: [phone]

## 2018-12-30 ENCOUNTER — Encounter (HOSPITAL_COMMUNITY): Payer: Self-pay | Admitting: Vascular Surgery

## 2018-12-30 DIAGNOSIS — I10 Essential (primary) hypertension: Secondary | ICD-10-CM

## 2018-12-30 DIAGNOSIS — I482 Chronic atrial fibrillation, unspecified: Secondary | ICD-10-CM

## 2018-12-30 DIAGNOSIS — Z89612 Acquired absence of left leg above knee: Secondary | ICD-10-CM

## 2018-12-30 DIAGNOSIS — S91302A Unspecified open wound, left foot, initial encounter: Secondary | ICD-10-CM

## 2018-12-30 DIAGNOSIS — Z89611 Acquired absence of right leg above knee: Secondary | ICD-10-CM

## 2018-12-30 LAB — RENAL FUNCTION PANEL
Albumin: 2.2 g/dL — ABNORMAL LOW (ref 3.5–5.0)
Anion gap: 14 (ref 5–15)
BUN: 48 mg/dL — ABNORMAL HIGH (ref 8–23)
CO2: 23 mmol/L (ref 22–32)
Calcium: 8.3 mg/dL — ABNORMAL LOW (ref 8.9–10.3)
Chloride: 102 mmol/L (ref 98–111)
Creatinine, Ser: 5.38 mg/dL — ABNORMAL HIGH (ref 0.61–1.24)
GFR calc Af Amer: 11 mL/min — ABNORMAL LOW (ref >60)
GFR, EST NON AFRICAN AMERICAN: 9 mL/min — AB (ref >60)
Glucose, Bld: 173 mg/dL — ABNORMAL HIGH (ref 70–99)
Phosphorus: 4.8 mg/dL — ABNORMAL HIGH (ref 2.5–4.6)
Potassium: 4.3 mmol/L (ref 3.5–5.1)
Sodium: 139 mmol/L (ref 135–145)

## 2018-12-30 LAB — CBC
HCT: 26 % — ABNORMAL LOW (ref 39.0–52.0)
Hemoglobin: 8.2 g/dL — ABNORMAL LOW (ref 13.0–17.0)
MCH: 30.8 pg (ref 26.0–34.0)
MCHC: 31.5 g/dL (ref 30.0–36.0)
MCV: 97.7 fL (ref 80.0–100.0)
Platelets: 171 10*3/uL (ref 150–400)
RBC: 2.66 MIL/uL — AB (ref 4.22–5.81)
RDW: 18.3 % — ABNORMAL HIGH (ref 11.5–15.5)
WBC: 25.7 10*3/uL — ABNORMAL HIGH (ref 4.0–10.5)
nRBC: 0 % (ref 0.0–0.2)

## 2018-12-30 LAB — PROTIME-INR
INR: 1.75
Prothrombin Time: 20.3 seconds — ABNORMAL HIGH (ref 11.4–15.2)

## 2018-12-30 LAB — GLUCOSE, CAPILLARY: Glucose-Capillary: 149 mg/dL — ABNORMAL HIGH (ref 70–99)

## 2018-12-30 MED ORDER — PANTOPRAZOLE SODIUM 40 MG IV SOLR
40.0000 mg | Freq: Once | INTRAVENOUS | Status: AC
  Administered 2018-12-30: 40 mg via INTRAVENOUS
  Filled 2018-12-30: qty 40

## 2018-12-30 MED ORDER — METOPROLOL TARTRATE 5 MG/5ML IV SOLN
5.0000 mg | Freq: Four times a day (QID) | INTRAVENOUS | Status: DC | PRN
  Administered 2018-12-30 – 2019-01-02 (×4): 5 mg via INTRAVENOUS
  Filled 2018-12-30 (×4): qty 5

## 2018-12-30 MED ORDER — DARBEPOETIN ALFA 150 MCG/0.3ML IJ SOSY
PREFILLED_SYRINGE | INTRAMUSCULAR | Status: AC
  Filled 2018-12-30: qty 0.3

## 2018-12-30 NOTE — Anesthesia Postprocedure Evaluation (Signed)
Anesthesia Post Note  Patient: [name]  Procedure(s) Performed: AMPUTATION ABOVE KNEE (Bilateral )     Patient location during evaluation: PACU Anesthesia Type: General Level of consciousness: awake Pain management: pain level controlled Vital Signs Assessment: post-procedure vital signs reviewed and stable Respiratory status: spontaneous breathing Cardiovascular status: stable Postop Assessment: no apparent nausea or vomiting Anesthetic complications: no    Last Vitals:  Vitals:   12/30/18 0400 12/30/18 0714  BP: (!) 143/73 104/61  Pulse: (!) 107 82  Resp: (!) 27 (!) 25  Temp:  37.6 C  SpO2: 99% 95%    Last Pain:  Vitals:   12/30/18 0714  TempSrc: Oral  PainSc:    Pain Goal: Patients Stated Pain Goal: 2 (12/29/18 2158)               Huston Foley

## 2018-12-30 NOTE — Progress Notes (Addendum)
PROGRESS NOTE    [name]   OEV:[id number]  DOB: 09/17/1941  DOA: 12/23/2018 PCP: Hennie Duos, MD   Brief Narrative:    [name] is a 78 y.o. male with medical history significant for dementia, ESRD on HD; DM; OSA on CPAP; HTN; HLD; CHF; PVD with L 5th ray amputation on 12/10; and afib on Coumadin presenting with weakness. He has dementia and is unable to give history.  Sent from [facility] for subjective fevers, weakness. He underwent left fifth toe amputation on 12/10 secondary to gangrene. The staff spoke with Vascular surgery and was recommended to send patient to ED. He had received a dose of IM Ceftraixone the day before.  EMS reports the patient was also noted to be short of breath on arrival. He was on 3L [place]. In ED, diagnosed with multifocal pneumonia and started on Vancomycin, Levaquin and Cefepime. In ED > ED, WBC 17.9, T-99 rectal, BP 115/63, HR 103. CXR shows Asymmetric airspace pulmonary edema versus multifocal bilateral pneumonia, R>L. BC X 2 ordered.  Was dialyzed overnight as he was volume overloaded per nephro.    Subjective: pt seen in room, resting and no c/o's this am.     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 PUD? - on BID Protonix as outpt - continue   DNR    DVT  prophylaxis: INR still elevated Code Status: DNR Disposition: follow on telemetry Family Communication: none here  Kelly Splinter MD [facility] pgr [phone] 12/30/2018, 8:21 AM   Consultants:   Nephrology  Vascular surgery Procedures:   none Antimicrobials:  Anti-infectives (From admission, onward)   Start     Dose/Rate Route Frequency Ordered Stop   12/26/18 1200  vancomycin (VANCOCIN) IVPB 750 mg/150 ml premix     750 mg 150 mL/hr over 60 Minutes Intravenous Every M-W-F (Hemodialysis) 12/23/18 1231     12/25/18 1800  levofloxacin (LEVAQUIN) IVPB 500 mg  Status:  Discontinued     500 mg 100 mL/hr over 60 Minutes Intravenous Every 48 hours 12/23/18 1805 12/25/18 1116   12/25/18 0745  vancomycin (VANCOCIN) IVPB 750 mg/150 ml premix     750 mg 150 mL/hr over 60 Minutes Intravenous  Once 12/25/18 0723 12/25/18 0953   12/24/18 1300  ceFEPIme (MAXIPIME) 1 g in sodium chloride 0.9 % 100 mL IVPB     1 g 200 mL/hr over 30 Minutes Intravenous Every 24 hours 12/23/18 1230     12/24/18 0200  vancomycin (VANCOCIN) IVPB 750 mg/150 ml premix     750 mg 150 mL/hr over 60 Minutes Intravenous  Once 12/23/18 2049 12/24/18 0150   12/23/18 1815  levofloxacin (LEVAQUIN) IVPB 750 mg  Status:  Discontinued     750 mg  100 mL/hr over 90 Minutes Intravenous  Once 12/23/18 1805 12/25/18 1116   12/23/18 1245  vancomycin (VANCOCIN) 1,500 mg in sodium chloride 0.9 % 500 mL IVPB     1,500 mg 250 mL/hr over 120 Minutes Intravenous  Once 12/23/18 1230 12/23/18 1536   12/23/18 1245  ceFEPIme (MAXIPIME) 2 g in sodium chloride 0.9 % 100 mL IVPB     2 g 200 mL/hr over 30 Minutes Intravenous  Once 12/23/18 1230 12/23/18 1325       Objective: Vitals:   12/30/18 0000 12/30/18 0343 12/30/18 0400 12/30/18 0714  BP: 128/82  (!) 143/73 104/61  Pulse: (!) 110  (!) 107 82  Resp: (!) 34  (!) 27 (!) 25  Temp:  (!) 97.5 F (36.4 C)  99.7 F (37.6 C)  TempSrc:  Oral  Oral  SpO2: 98%  99% 95%    Weight:      Height:        Intake/Output Summary (Last 24 hours) at 12/30/2018 0813 Last data filed at 12/29/2018 1748 Gross per 24 hour  Intake 520 ml  Output 300 ml  Net 220 ml   Filed Weights   12/26/18 1623 12/28/18 0731 12/28/18 1138  Weight: 69.9 kg 69.5 kg 67.8 kg    Exam: Gen: Appears comfortable  HEENT: PERRL Resp: clear bilat no rales or wheezing Cardiovascular: Irreg no mrg, no edema Abdomen: soft, ntnd no mass or ascites Central nervous system: Alert-disoriented to time - No focal neurological deficits. Skin: bilat new AKA w/ dressings in place     Data Reviewed: I have personally reviewed following labs and imaging studies  CBC: Recent Labs  Lab 12/23/18 1128 12/24/18 0227  12/26/18 0243 12/27/18 0248 12/28/18 0253 12/29/18 0209 12/30/18 0322  WBC 17.9* 15.6*   < > 12.3* 14.1* 15.6* 14.8* 25.7*  NEUTROABS 15.5* 13.0*  --   --   --   --   --   --   HGB 9.5* 10.5*   < > 9.7* 10.0* 10.2* 10.0* 8.2*  HCT 31.7* 33.5*   < > 32.3* 32.5* 32.6* 32.7* 26.0*  MCV 100.6* 99.4   < > 99.1 100.9* 98.5 97.3 97.7  PLT 223 204   < > 202 196 175 159 171   < > = values in this interval not displayed.   Basic Metabolic Panel: Recent Labs  Lab 12/26/18 0243 12/27/18 0248 12/28/18 0253 12/29/18 0209 12/30/18 0322  NA 135 137 137 140 139  K 4.1 4.5 4.4 3.7 4.3  CL 95* 98 99 101 102  CO2 24 26 25 29 23   GLUCOSE 182* 200* 138* 139* 173*  BUN 53* 27* 56* 30* 48*  CREATININE 4.95* 3.13* 5.00* 3.52* 5.38*  CALCIUM 9.1 8.8* 9.1 8.6* 8.3*  PHOS 2.3* 1.8* 2.4* 2.3* 4.8*   GFR: Estimated Creatinine Clearance: 10.2 mL/min (A) (by C-G formula based on SCr of 5.38 mg/dL (H)). Liver Function Tests: Recent Labs  Lab 12/23/18 1128  12/26/18 0243 12/27/18 0248 12/28/18 0253 12/29/18 0209 12/30/18 0322  AST 26  --   --   --   --   --   --   ALT 20  --   --   --   --   --   --   ALKPHOS 290*  --   --   --   --   --   --   BILITOT 0.9  --   --   --   --   --   --  PROT 7.0  --   --   --   --   --   --   ALBUMIN 2.4*   < > 2.4* 2.3* 2.4* 2.4* 2.2*   < > = values in this interval not displayed.   No results for input(s): LIPASE, AMYLASE in the last 168 hours. No results for input(s): AMMONIA in the last 168 hours. Coagulation Profile: Recent Labs  Lab 12/26/18 0243 12/27/18 0248 12/28/18 0253 12/29/18 0209 12/30/18 0322  INR 3.97 3.13 2.64 2.11 1.75   Cardiac Enzymes: No results for input(s): CKTOTAL, CKMB, CKMBINDEX, TROPONINI in the last 168 hours. BNP (last 3 results) No results for input(s): PROBNP in the last 8760 hours. HbA1C: No results for input(s): HGBA1C in the last 72 hours. CBG: Recent Labs  Lab 12/29/18 1556  GLUCAP 108*   Lipid Profile: No results for input(s): CHOL, HDL, LDLCALC, TRIG, CHOLHDL, LDLDIRECT in the last 72 hours. Thyroid Function Tests: No results for input(s): TSH, T4TOTAL, FREET4, T3FREE, THYROIDAB in the last 72 hours. Anemia Panel: No results for input(s): VITAMINB12, FOLATE, FERRITIN, TIBC, IRON, RETICCTPCT in the last 72 hours. Urine analysis:    Component Value Date/Time   COLORURINE YELLOW 10/20/2008 [place] 10/20/2008 0942   LABSPEC 1.011 10/20/2008 0942   PHURINE 6.0 10/20/2008 0942   GLUCOSEU NEGATIVE 10/20/2008 0942   HGBUR LARGE (A) 10/20/2008 0942   BILIRUBINUR NEGATIVE 10/20/2008 0942   KETONESUR NEGATIVE 10/20/2008 0942   PROTEINUR 100 (A) 10/20/2008 0942   UROBILINOGEN 1.0 10/20/2008 0942   NITRITE NEGATIVE 10/20/2008 0942   LEUKOCYTESUR SMALL (A) 10/20/2008 0942   Sepsis Labs: @LABRCNTIP (procalcitonin:4,lacticidven:4) ) Recent Results (from the past 240 hour(s))  Blood Culture (routine x 2)     Status: None   Collection Time: 12/23/18 11:38 AM  Result Value Ref Range Status   Specimen Description BLOOD RIGHT ANTECUBITAL  Final   Special Requests   Final    BOTTLES DRAWN AEROBIC AND ANAEROBIC Blood Culture adequate volume   Culture   Final    NO GROWTH 5  DAYS Performed at [facility], [place] [address] [place], [place] [address]    Report Status 12/28/2018 FINAL  Final  Blood Culture (routine x 2)     Status: None   Collection Time: 12/23/18 12:45 PM  Result Value Ref Range Status   Specimen Description BLOOD RIGHT ANTECUBITAL  Final   Special Requests AEROBIC BOTTLE ONLY Blood Culture adequate volume  Final   Culture   Final    NO GROWTH 5 DAYS Performed at [facility], [place] [address], [place], [place] [address]    Report Status 12/28/2018 FINAL  Final  MRSA PCR Screening     Status: None   Collection Time: 12/23/18  5:08 PM  Result Value Ref Range Status   MRSA by PCR NEGATIVE NEGATIVE Final    Comment:        The GeneXpert MRSA Assay (FDA approved for NASAL specimens only), is one component of a comprehensive MRSA colonization surveillance program. It is not intended to diagnose MRSA infection nor to guide or monitor treatment for MRSA infections. Performed at [facility], [place] [address] [place], [place] [address]          Radiology Studies: No results found.    Scheduled Meds: . acetaminophen  1,000 mg Oral TID  . atorvastatin  20 mg Oral q1800  . chlorhexidine  15 mL Mouth Rinse BID  . Chlorhexidine  Gluconate Cloth  6 each Topical [id number]  . darbepoetin (ARANESP) injection - DIALYSIS  150 mcg Intravenous Q Fri-HD  . feeding supplement (NEPRO CARB STEADY)  237 mL Oral BID BM  . feeding supplement (PRO-STAT SUGAR FREE 64)  30 mL Oral BID  . hydrocerin   Topical BID  . mouth rinse  15 mL Mouth Rinse q12n4p  . metoprolol tartrate  25 mg Oral BID  . midodrine  10 mg Oral Q M,W,F-HD  . multivitamin  1 tablet Oral QHS  . pantoprazole  40 mg Oral BID   Continuous Infusions: . ceFEPime (MAXIPIME) IV 1 g (12/29/18 1150)  . vancomycin 750 mg (12/28/18 1029)     LOS: 7 days

## 2018-12-30 NOTE — Progress Notes (Signed)
Pt left unit for dialysis, taken on bed with transporter, opens eyes  To name call.

## 2018-12-30 NOTE — Progress Notes (Signed)
CSW spoke with the patients daughter and she wants her father to return back to his skilled nursing facility.   [facility] is able to take him back once medically stable.   Domenic Schwab, MSW, [place]

## 2018-12-30 NOTE — Clinical Social Work Note (Signed)
Clinical Social Work Assessment  Patient Details  Name: [name] MRN: [id number] Date of Birth: 1941-03-21  Date of referral:  12/30/18               Reason for consult:  Discharge Planning                Permission sought to share information with:  Case Manager Permission granted to share information::  Yes, Verbal Permission Granted  Name::     [name]  Agency::  [facility]  Relationship::  Daughter   Contact Information:  [phone]  Housing/Transportation Living arrangements for the past 2 months:  [place], [place] of Information:  Adult Children Patient Interpreter Needed:  None Criminal Activity/Legal Involvement Pertinent to Current Situation/Hospitalization:  No - Comment as needed Significant Relationships:  Adult Children Lives with:  Self Do you feel safe going back to the place where you live?  Yes Need for family participation in patient care:  No (Coment)  Care giving concerns:    Patient recently has had surgery. Patient's daughter comes up to the hospital frequently to check on her father. She did not report any more concerns. She is agreeable to having her father to return to [facility]. She reported that he already has a room at the facility and she would like him to continue his rehab.   No other concerns were reported.    Social Worker assessment / plan:    CSW has completed the FL2. [facility] is able to accept the patient back once medically stable.   Employment status:  Retired Forensic scientist:  [employer] PT Recommendations:  [place] / Referral to community resources:  [place]  Patient/Family's Response to care:  Patient's family is understanding of patients medical status and wants the patient to continue rehab once he is medically stable and able to discharge from the hospital.   Patient/Family's Understanding of and Emotional Response to Diagnosis,  Current Treatment, and Prognosis:  Patient's daughter is understanding and is a support for the patient. No other concerns were acknowledged at this time.  Emotional Assessment Appearance:  Appears stated age Attitude/Demeanor/Rapport:  Unable to Assess Affect (typically observed):  Unable to Assess Orientation:  Oriented to Self, Oriented to Place Alcohol / Substance use:  Not Applicable Psych involvement (Current and /or in the community):  No (Comment)  Discharge Needs  Concerns to be addressed:  Decision making concerns Readmission within the last 30 days:  Yes Current discharge risk:  Dependent with Mobility Barriers to Discharge:  No Barriers Identified   Taegan Haider B Lulabelle Desta, LCSWA 12/30/2018, 12:39 PM

## 2018-12-30 NOTE — Progress Notes (Signed)
Pt refuses to eat medicated with iv metoprolol, comfortable in bed, o2 in progress, care and observation continues.

## 2018-12-30 NOTE — Progress Notes (Signed)
While getting report on another patient was called into [name]'s room patient had pulled out Arterial line blood all over patient and bed. Patient cleaned up and dressing applied over site. Will continue to monitor patient.  Kerry Odonohue, Tivis Ringer, RN

## 2018-12-30 NOTE — Progress Notes (Signed)
ANTICOAGULATION CONSULT NOTE - Follow Up Consult  Pharmacy Consult for Coumadin Indication: atrial fibrillation  Allergies  Allergen Reactions  . Occlusive Silicone Sheets [Silicone]   . Other     Occlusive adhesive  . Tape Itching and Other (See Comments)    Cloth tape only    Patient Measurements: Height: 5\' 6"  (167.6 cm) Weight: 149 lb 7.6 oz (67.8 kg) IBW/kg (Calculated) : 63.8 Heparin Dosing Weight: n/a  Vital Signs: Temp: 99.7 F (37.6 C) (01/03 0714) Temp Source: Oral (01/03 0714) BP: 104/61 (01/03 0714) Pulse Rate: 82 (01/03 0714)  Labs: Recent Labs    12/28/18 0253 12/29/18 0209 12/30/18 0322  HGB 10.2* 10.0* 8.2*  HCT 32.6* 32.7* 26.0*  PLT 175 159 171  LABPROT 27.8* 23.4* 20.3*  INR 2.64 2.11 1.75  CREATININE 5.00* 3.52* 5.38*    Estimated Creatinine Clearance: 10.2 mL/min (A) (by C-G formula based on SCr of 5.38 mg/dL (H)).   Medical History: Past Medical History:  Diagnosis Date  . A-fib ([place])   . Anemia   . Blood transfusion   . BPH (benign prostatic hyperplasia)   . CHF (congestive heart failure) ([place])   . Diarrhea   . DM (diabetes mellitus) ([place])   . ESRD on hemodialysis ([place])    Started dialysis in 2009  . History of GI bleed    secondary to coumadin  . HTN (hypertension)   . Hyperlipidemia   . OSA (obstructive sleep apnea)    uses CPAP  . Secondary hyperparathyroidism of renal origin ([place])     Medications:  Scheduled:  . acetaminophen  1,000 mg Oral TID  . atorvastatin  20 mg Oral q1800  . chlorhexidine  15 mL Mouth Rinse BID  . Chlorhexidine Gluconate Cloth  6 each Topical Q0600  . darbepoetin (ARANESP) injection - DIALYSIS  150 mcg Intravenous Q Fri-HD  . feeding supplement (NEPRO CARB STEADY)  237 mL Oral BID BM  . feeding supplement (PRO-STAT SUGAR FREE 64)  30 mL Oral BID  . hydrocerin   Topical BID  . mouth rinse  15 mL Mouth Rinse q12n4p  . metoprolol tartrate  25 mg Oral BID  . midodrine  10 mg Oral Q M,W,F-HD  .  multivitamin  1 tablet Oral QHS  . pantoprazole  40 mg Oral BID    Assessment: 60 yoM on warfarin PTA for PAF. Pt admitted with elevated INR which was held. 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 INR today 1.75, pharmacy consulted to resume warfarin but per [facility] notes, may be okay to resume warfarin on 1/4 after dressings down. Will hold off on warfarin again tonight and resume tomorrow.  *Warfarin PTA dose: 3 mg daily  Goal of Therapy:  INR 2-3 Monitor platelets by anticoagulation protocol: Yes   Plan:  -Hold warfarin tonight -Follow INR -Likely restart warfarin tomorrow  [name], PharmD, BCPS Clinical Pharmacist [phone] Please check AMION for all [employer] numbers 12/30/2018

## 2018-12-30 NOTE — Progress Notes (Signed)
Pt is very drowsy , is unable to swallow food at this time, will wait until he's more alert to try with orals. R .[place] informed via amniom. Of oral meds .

## 2018-12-30 NOTE — Progress Notes (Signed)
Patient pulled surgical dressings off, surgical  site bleeding new dressing applied, called MD for restraint order. Bilateral wrist restraints applied. Will continue to monitor patient closely. Anjoli Diemer, Tivis Ringer, RN

## 2018-12-30 NOTE — Procedures (Signed)
I was present at this dialysis session. I have reviewed the session itself and made appropriate changes.   Filed Weights   12/28/18 0731 12/28/18 1138 12/30/18 1207  Weight: 69.5 kg 67.8 kg 60 kg    Recent Labs  Lab 12/30/18 0322  NA 139  K 4.3  CL 102  CO2 23  GLUCOSE 173*  BUN 48*  CREATININE 5.38*  CALCIUM 8.3*  PHOS 4.8*    Recent Labs  Lab 12/24/18 0227  12/28/18 0253 12/29/18 0209 12/30/18 0322  WBC 15.6*   < > 15.6* 14.8* 25.7*  NEUTROABS 13.0*  --   --   --   --   HGB 10.5*   < > 10.2* 10.0* 8.2*  HCT 33.5*   < > 32.6* 32.7* 26.0*  MCV 99.4   < > 98.5 97.3 97.7  PLT 204   < > 175 159 171   < > = values in this interval not displayed.    Scheduled Meds: . acetaminophen  1,000 mg Oral TID  . atorvastatin  20 mg Oral q1800  . chlorhexidine  15 mL Mouth Rinse BID  . Chlorhexidine Gluconate Cloth  6 each Topical Q0600  . darbepoetin (ARANESP) injection - DIALYSIS  150 mcg Intravenous Q Fri-HD  . feeding supplement (NEPRO CARB STEADY)  237 mL Oral BID BM  . feeding supplement (PRO-STAT SUGAR FREE 64)  30 mL Oral BID  . hydrocerin   Topical BID  . mouth rinse  15 mL Mouth Rinse q12n4p  . metoprolol tartrate  25 mg Oral BID  . midodrine  10 mg Oral Q M,W,F-HD  . multivitamin  1 tablet Oral QHS  . pantoprazole  40 mg Oral BID   Continuous Infusions: PRN Meds:.acetaminophen **OR** acetaminophen, calcium carbonate (dosed in mg elemental calcium), camphor-menthol **AND** hydrOXYzine, docusate sodium, feeding supplement (NEPRO CARB STEADY), ondansetron **OR** ondansetron (ZOFRAN) IV, oxyCODONE, sorbitol, zolpidem   Pearson Grippe  MD 12/30/2018, 1:17 PM

## 2018-12-30 NOTE — Progress Notes (Signed)
CSW attempted to complete an assessment with the patient's daughter since he is orientated x2.   I left a message asking her to call me back.   Will attempt again later.   Domenic Schwab, MSW, [place]

## 2018-12-30 NOTE — Progress Notes (Signed)
  Progress Note    12/30/2018 8:30 AM 1 Day Post-Op  Subjective:  Confused overnigh  Vitals:   12/30/18 0400 12/30/18 0714  BP: (!) 143/73 104/61  Pulse: (!) 107 82  Resp: (!) 27 (!) 25  Temp:  99.7 F (37.6 C)  SpO2: 99% 95%    Physical Exam: Sleeping Bilateral aka sites with dressings in place  CBC    Component Value Date/Time   WBC 25.7 (H) 12/30/2018 0322   RBC 2.66 (L) 12/30/2018 0322   HGB 8.2 (L) 12/30/2018 0322   HCT 26.0 (L) 12/30/2018 0322   HCT 27.1 (L) 11/09/2018 0230   PLT 171 12/30/2018 0322   MCV 97.7 12/30/2018 0322   MCH 30.8 12/30/2018 0322   MCHC 31.5 12/30/2018 0322   RDW 18.3 (H) 12/30/2018 0322   LYMPHSABS 0.9 12/24/2018 0227   MONOABS 1.2 (H) 12/24/2018 0227   EOSABS 0.2 12/24/2018 0227   BASOSABS 0.1 12/24/2018 0227    BMET    Component Value Date/Time   NA 139 12/30/2018 0322   NA 144 12/22/2018   K 4.3 12/30/2018 0322   CL 102 12/30/2018 0322   CO2 23 12/30/2018 0322   GLUCOSE 173 (H) 12/30/2018 0322   BUN 48 (H) 12/30/2018 0322   BUN 65 (A) 12/22/2018   CREATININE 5.38 (H) 12/30/2018 0322   CALCIUM 8.3 (L) 12/30/2018 0322   CALCIUM 8.3 (L) 10/25/2008 1450   GFRNONAA 9 (L) 12/30/2018 0322   GFRAA 11 (L) 12/30/2018 0322    INR    Component Value Date/Time   INR 1.75 12/30/2018 0322     Intake/Output Summary (Last 24 hours) at 12/30/2018 0830 Last data filed at 12/29/2018 1748 Gross per 24 hour  Intake 520 ml  Output 300 ml  Net 220 ml     Assessment:  78 y.o. male is s/p bilateral aka  Plan: Dressings down tomorrow and will be likely be ok to restart coumadin after   Davontae Prusinski C. Donzetta Matters, MD [facility] Office: [phone] Pager: [phone]  12/30/2018 8:30 AM

## 2018-12-30 NOTE — Progress Notes (Signed)
Pt returned from Dialysis . Made comfortable in bed, responds to name call, but refuses to eat . Speaks clearly. Care continues.

## 2018-12-31 LAB — RENAL FUNCTION PANEL
ANION GAP: 12 (ref 5–15)
Albumin: 2.1 g/dL — ABNORMAL LOW (ref 3.5–5.0)
BUN: 17 mg/dL (ref 8–23)
CO2: 27 mmol/L (ref 22–32)
Calcium: 8.2 mg/dL — ABNORMAL LOW (ref 8.9–10.3)
Chloride: 101 mmol/L (ref 98–111)
Creatinine, Ser: 3.25 mg/dL — ABNORMAL HIGH (ref 0.61–1.24)
GFR calc Af Amer: 20 mL/min — ABNORMAL LOW (ref >60)
GFR calc non Af Amer: 17 mL/min — ABNORMAL LOW (ref >60)
GLUCOSE: 147 mg/dL — AB (ref 70–99)
Phosphorus: 3.8 mg/dL (ref 2.5–4.6)
Potassium: 3.5 mmol/L (ref 3.5–5.1)
Sodium: 140 mmol/L (ref 135–145)

## 2018-12-31 LAB — CBC
HCT: 26 % — ABNORMAL LOW (ref 39.0–52.0)
Hemoglobin: 8 g/dL — ABNORMAL LOW (ref 13.0–17.0)
MCH: 29.9 pg (ref 26.0–34.0)
MCHC: 30.8 g/dL (ref 30.0–36.0)
MCV: 97 fL (ref 80.0–100.0)
Platelets: 149 10*3/uL — ABNORMAL LOW (ref 150–400)
RBC: 2.68 MIL/uL — ABNORMAL LOW (ref 4.22–5.81)
RDW: 18.2 % — ABNORMAL HIGH (ref 11.5–15.5)
WBC: 15 10*3/uL — ABNORMAL HIGH (ref 4.0–10.5)
nRBC: 0 % (ref 0.0–0.2)

## 2018-12-31 LAB — PROTIME-INR
INR: 1.79
Prothrombin Time: 20.5 seconds — ABNORMAL HIGH (ref 11.4–15.2)

## 2018-12-31 MED ORDER — ENOXAPARIN SODIUM 30 MG/0.3ML ~~LOC~~ SOLN
30.0000 mg | SUBCUTANEOUS | Status: DC
  Administered 2018-12-31 – 2019-01-03 (×4): 30 mg via SUBCUTANEOUS
  Filled 2018-12-31 (×4): qty 0.3

## 2018-12-31 MED ORDER — WARFARIN SODIUM 3 MG PO TABS: 3.0000 mg | ORAL_TABLET | Freq: Once | ORAL | Status: DC

## 2018-12-31 MED ORDER — WARFARIN - PHARMACIST DOSING INPATIENT: Freq: Every day | Status: DC

## 2018-12-31 NOTE — Plan of Care (Signed)
  Problem: Education: Goal: Knowledge of General Education information will improve Description Including pain rating scale, medication(s)/side effects and non-pharmacologic comfort measures Outcome: Progressing   

## 2018-12-31 NOTE — Progress Notes (Signed)
RT Note:  Placed patient on CPAP.  Patient could not tell me settings so placed on minimum pressure of 6, max of 16.  Bled 2L O2 into machine due to patient's sats fluctuating.  Patient was sating 97% after placement.  RT will continue to monitor.

## 2018-12-31 NOTE — Progress Notes (Signed)
ANTICOAGULATION CONSULT NOTE - Follow Up Consult  Pharmacy Consult for Coumadin Indication: atrial fibrillation  Allergies  Allergen Reactions  . Occlusive Silicone Sheets [Silicone]   . Other     Occlusive adhesive  . Tape Itching and Other (See Comments)    Cloth tape only    Patient Measurements: Height: 5\' 6"  (167.6 cm) Weight: 129 lb 3 oz (58.6 kg) IBW/kg (Calculated) : 63.8 Heparin Dosing Weight: n/a  Vital Signs: Temp: 98.7 F (37.1 C) (01/04 1131) Temp Source: Oral (01/04 1131) BP: 120/61 (01/04 1131) Pulse Rate: 95 (01/04 1131)  Labs: Recent Labs    12/29/18 0209 12/30/18 0322 12/31/18 0252  HGB 10.0* 8.2* 8.0*  HCT 32.7* 26.0* 26.0*  PLT 159 171 149*  LABPROT 23.4* 20.3* 20.5*  INR 2.11 1.75 1.79  CREATININE 3.52* 5.38* 3.25*    Estimated Creatinine Clearance: 15.5 mL/min (A) (by C-G formula based on SCr of 3.25 mg/dL (H)).   Medical History: Past Medical History:  Diagnosis Date  . A-fib ([place])   . Anemia   . Blood transfusion   . BPH (benign prostatic hyperplasia)   . CHF (congestive heart failure) ([place])   . Diarrhea   . DM (diabetes mellitus) ([place])   . ESRD on hemodialysis ([place])    Started dialysis in 2009  . History of GI bleed    secondary to coumadin  . HTN (hypertension)   . Hyperlipidemia   . OSA (obstructive sleep apnea)    uses CPAP  . Secondary hyperparathyroidism of renal origin ([place])     Medications:  Scheduled:  . acetaminophen  1,000 mg Oral TID  . atorvastatin  20 mg Oral q1800  . chlorhexidine  15 mL Mouth Rinse BID  . darbepoetin (ARANESP) injection - DIALYSIS  150 mcg Intravenous Q Fri-HD  . enoxaparin (LOVENOX) injection  30 mg Subcutaneous Q24H  . feeding supplement (NEPRO CARB STEADY)  237 mL Oral BID BM  . feeding supplement (PRO-STAT SUGAR FREE 64)  30 mL Oral BID  . hydrocerin   Topical BID  . mouth rinse  15 mL Mouth Rinse q12n4p  . metoprolol tartrate  25 mg Oral BID  . midodrine  10 mg Oral Q M,W,F-HD   . multivitamin  1 tablet Oral QHS  . pantoprazole  40 mg Oral BID    Assessment: [name] on warfarin PTA for PAF. Pt admitted with elevated INR which was held. 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 INR today 1.79, pharmacy consulted to resume warfarin.  *Warfarin PTA dose: 3 mg daily  Goal of Therapy:  INR 2-3 Monitor platelets by anticoagulation protocol: Yes   Plan:  -Warfarin 3 mg x 1 tonight. -Daily INR.  [name], [facility] Clinical Pharmacist Phone [phone]  12/31/2018 11:56 AM

## 2018-12-31 NOTE — Progress Notes (Addendum)
PROGRESS NOTE    QUASIM DOYON   BZJ:[id number]  DOB: 1941/12/18  DOA: 12/23/2018 PCP: Hennie Duos, MD   Brief Narrative:    FINLEY CHEVEZ is a 78 y.o. male with medical history significant for dementia, ESRD on HD; DM; OSA on CPAP; HTN; HLD; CHF; PVD with L 5th ray amputation on 12/10; and afib on Coumadin presenting with weakness. He has dementia and is unable to give history.  Sent from [facility] for subjective fevers, weakness. He underwent left fifth toe amputation on 12/10 secondary to gangrene. The staff spoke with Vascular surgery and was recommended to send patient to ED. He had received a dose of IM Ceftraixone the day before.  EMS reports the patient was also noted to be short of breath on arrival. He was on 3L [place]. In ED, diagnosed with multifocal pneumonia and started on Vancomycin, Levaquin and Cefepime. In ED > ED, WBC 17.9, T-99 rectal, BP 115/63, HR 103. CXR shows Asymmetric airspace pulmonary edema versus multifocal bilateral pneumonia, R>L. BC X 2 ordered.  Was dialyzed overnight as he was volume overloaded per nephro.    Subjective: pt much more alert, in restraints for pulling off dressings, lines, IV"s, etc.  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 Had HD yesterday  PAF (paroxysmal atrial fibrillation)/ coagulopathy - INR * >> 1.7 , resume warfarin per  pharmacy - cont Lopressor , heart rates better now that taking po metoprolol     OBSTRUCTIVE SLEEP APNEA - CPAP ordered  Conjuctivitis- left eye -  Better, erythromycin ointment dc'd 12/29/17,  follow   GERD? PUD? - on BID Protonix as outpt - continue   DNR    DVT prophylaxis: lovenox Code Status: DNR Disposition: transfer to Med Surg, dc to SNF when eating and mentating better Family Communication: none here  Kelly Splinter MD [facility] pgr [phone] 12/31/2018, 10:44 AM   Consultants:   Nephrology  Vascular surgery Procedures:   none Antimicrobials:  Anti-infectives (From admission, onward)   Start     Dose/Rate Route Frequency Ordered Stop   12/26/18 1200  vancomycin (VANCOCIN) IVPB 750 mg/150 ml premix  Status:  Discontinued     750 mg 150 mL/hr over 60 Minutes Intravenous Every M-W-F (Hemodialysis) 12/23/18 1231 12/30/18 0910   12/25/18 1800  levofloxacin (LEVAQUIN) IVPB 500 mg  Status:  Discontinued     500 mg 100 mL/hr over 60 Minutes Intravenous Every 48 hours 12/23/18 1805 12/25/18 1116   12/25/18 0745  vancomycin (VANCOCIN) IVPB 750 mg/150 ml premix     750 mg 150 mL/hr over 60 Minutes Intravenous  Once 12/25/18 0723 12/25/18 0953   12/24/18 1300  ceFEPIme (MAXIPIME) 1 g in sodium chloride 0.9 % 100 mL IVPB  Status:  Discontinued     1 g 200  mL/hr over 30 Minutes Intravenous Every 24 hours 12/23/18 1230 12/30/18 0910   12/24/18 0200  vancomycin (VANCOCIN) IVPB 750 mg/150 ml premix     750 mg 150 mL/hr over 60 Minutes Intravenous  Once 12/23/18 2049 12/24/18 0150   12/23/18 1815  levofloxacin (LEVAQUIN) IVPB 750 mg  Status:  Discontinued     750 mg 100 mL/hr over 90 Minutes Intravenous  Once 12/23/18 1805 12/25/18 1116   12/23/18 1245  vancomycin (VANCOCIN) 1,500 mg in sodium chloride 0.9 % 500 mL IVPB     1,500 mg 250 mL/hr over 120 Minutes Intravenous  Once 12/23/18 1230 12/23/18 1536   12/23/18 1245  ceFEPIme (MAXIPIME) 2 g in sodium  chloride 0.9 % 100 mL IVPB     2 g 200 mL/hr over 30 Minutes Intravenous  Once 12/23/18 1230 12/23/18 1325       Objective: Vitals:   12/31/18 0400 12/31/18 0729 12/31/18 0834 12/31/18 1000  BP: (!) 152/89 121/72 139/68   Pulse: (!) 111 (!) 115 (!) 115 96  Resp: 15 17  (!) 27  Temp:  98.4 F (36.9 C)    TempSrc:  Oral    SpO2: 99% 100%  100%  Weight:      Height:        Intake/Output Summary (Last 24 hours) at 12/31/2018 1044 Last data filed at 12/31/2018 1004 Gross per 24 hour  Intake 240 ml  Output 1700 ml  Net -1460 ml   Filed Weights   12/28/18 1138 12/30/18 1207 12/30/18 1615  Weight: 67.8 kg 60 kg 58.6 kg    Exam: Gen: Appears comfortable  HEENT: PERRL Resp: clear bilat no rales or wheezing Cardiovascular: Irreg no mrg, no edema Abdomen: soft, ntnd no mass or ascites Central nervous system: Alert-disoriented to time - No focal neurological deficits. Skin: bilat new AKA w/ dressings in place     Data Reviewed: I have personally reviewed following labs and imaging studies  CBC: Recent Labs  Lab 12/27/18 0248 12/28/18 0253 12/29/18 0209 12/30/18 0322 12/31/18 0252  WBC 14.1* 15.6* 14.8* 25.7* 15.0*  HGB 10.0* 10.2* 10.0* 8.2* 8.0*  HCT 32.5* 32.6* 32.7* 26.0* 26.0*  MCV 100.9* 98.5 97.3 97.7 97.0  PLT 196 175 159 171 093*   Basic Metabolic Panel: Recent Labs  Lab 12/27/18 0248 12/28/18 0253 12/29/18 0209 12/30/18 0322 12/31/18 0252  NA 137 137 140 139 140  K 4.5 4.4 3.7 4.3 3.5  CL 98 99 101 102 101  CO2 26 25 29 23 27   GLUCOSE 200* 138* 139* 173* 147*  BUN 27* 56* 30* 48* 17  CREATININE 3.13* 5.00* 3.52* 5.38* 3.25*  CALCIUM 8.8* 9.1 8.6* 8.3* 8.2*  PHOS 1.8* 2.4* 2.3* 4.8* 3.8   GFR: Estimated Creatinine Clearance: 15.5 mL/min (A) (by C-G formula based on SCr of 3.25 mg/dL (H)). Liver Function Tests: Recent Labs  Lab 12/27/18 0248 12/28/18 0253 12/29/18 0209 12/30/18 0322 12/31/18 0252  ALBUMIN 2.3* 2.4* 2.4* 2.2* 2.1*   No  results for input(s): LIPASE, AMYLASE in the last 168 hours. No results for input(s): AMMONIA in the last 168 hours. Coagulation Profile: Recent Labs  Lab 12/27/18 0248 12/28/18 0253 12/29/18 0209 12/30/18 0322 12/31/18 0252  INR 3.13 2.64 2.11 1.75 1.79   Cardiac Enzymes: No results for input(s): CKTOTAL, CKMB, CKMBINDEX, TROPONINI in the last 168 hours. BNP (last 3 results) No results for input(s): PROBNP in the last 8760 hours. HbA1C: No results for input(s): HGBA1C in the last 72  hours. CBG: Recent Labs  Lab 12/29/18 1556 12/30/18 0930  GLUCAP 108* 149*   Lipid Profile: No results for input(s): CHOL, HDL, LDLCALC, TRIG, CHOLHDL, LDLDIRECT in the last 72 hours. Thyroid Function Tests: No results for input(s): TSH, T4TOTAL, FREET4, T3FREE, THYROIDAB in the last 72 hours. Anemia Panel: No results for input(s): VITAMINB12, FOLATE, FERRITIN, TIBC, IRON, RETICCTPCT in the last 72 hours. Urine analysis:    Component Value Date/Time   COLORURINE YELLOW 10/20/2008 [place] 10/20/2008 0942   LABSPEC 1.011 10/20/2008 0942   PHURINE 6.0 10/20/2008 0942   GLUCOSEU NEGATIVE 10/20/2008 0942   HGBUR LARGE (A) 10/20/2008 0942   BILIRUBINUR NEGATIVE 10/20/2008 0942   KETONESUR NEGATIVE 10/20/2008 0942   PROTEINUR 100 (A) 10/20/2008 0942   UROBILINOGEN 1.0 10/20/2008 0942   NITRITE NEGATIVE 10/20/2008 0942   LEUKOCYTESUR SMALL (A) 10/20/2008 0942   Sepsis Labs: @LABRCNTIP (procalcitonin:4,lacticidven:4) ) Recent Results (from the past 240 hour(s))  Blood Culture (routine x 2)     Status: None   Collection Time: 12/23/18 11:38 AM  Result Value Ref Range Status   Specimen Description BLOOD RIGHT ANTECUBITAL  Final   Special Requests   Final    BOTTLES DRAWN AEROBIC AND ANAEROBIC Blood Culture adequate volume   Culture   Final    NO GROWTH 5 DAYS Performed at [facility], [place] [address], [place], [place] [address]    Report Status 12/28/2018 FINAL   Final  Blood Culture (routine x 2)     Status: None   Collection Time: 12/23/18 12:45 PM  Result Value Ref Range Status   Specimen Description BLOOD RIGHT ANTECUBITAL  Final   Special Requests AEROBIC BOTTLE ONLY Blood Culture adequate volume  Final   Culture   Final    NO GROWTH 5 DAYS Performed at [facility], [place] [address] [place], [place] [address]    Report Status 12/28/2018 FINAL  Final  MRSA PCR Screening     Status: None   Collection Time: 12/23/18  5:08 PM  Result Value Ref Range Status   MRSA by PCR NEGATIVE NEGATIVE Final    Comment:        The GeneXpert MRSA Assay (FDA approved for NASAL specimens only), is one component of a comprehensive MRSA colonization surveillance program. It is not intended to diagnose MRSA infection nor to guide or monitor treatment for MRSA infections. Performed at [facility], [place] [address], [place], [place] [address]          Radiology Studies: No results found.    Scheduled Meds: . acetaminophen  1,000 mg Oral TID  . atorvastatin  20 mg Oral q1800  . chlorhexidine  15 mL Mouth Rinse BID  . darbepoetin (ARANESP) injection - DIALYSIS  150 mcg Intravenous Q Fri-HD  . feeding supplement (NEPRO CARB STEADY)  237 mL Oral BID BM  . feeding supplement (PRO-STAT SUGAR FREE 64)  30 mL Oral BID  . hydrocerin   Topical BID  . mouth rinse  15 mL Mouth Rinse q12n4p  . metoprolol tartrate  25 mg Oral BID  . midodrine  10 mg Oral Q M,W,F-HD  . multivitamin  1 tablet Oral QHS  . pantoprazole  40 mg Oral BID   Continuous Infusions:    LOS: 8 days

## 2018-12-31 NOTE — Progress Notes (Signed)
[facility]  Subjective  - some soreness right worse than left   Objective 120/61 95 98.7 F (37.1 C) (Oral) (!) 21 97%  Intake/Output Summary (Last 24 hours) at 12/31/2018 1401 Last data filed at 12/31/2018 1004 Gross per 24 hour  Intake 240 ml  Output 1700 ml  Net -1460 ml   AKA incisions intact no drainage or hematoma  Assessment/Planning: Healing AkAs  Dry dressings daily bilat AKAs  Follow up Dr Donzetta Matters 1 month  Will not follow actively  Call if questions  Ruta Hinds 12/31/2018 2:01 PM --  Laboratory Lab Results: Recent Labs    12/30/18 0322 12/31/18 0252  WBC 25.7* 15.0*  HGB 8.2* 8.0*  HCT 26.0* 26.0*  PLT 171 149*   BMET Recent Labs    12/30/18 0322 12/31/18 0252  NA 139 140  K 4.3 3.5  CL 102 101  CO2 23 27  GLUCOSE 173* 147*  BUN 48* 17  CREATININE 5.38* 3.25*  CALCIUM 8.3* 8.2*    COAG Lab Results  Component Value Date   INR 1.79 12/31/2018   INR 1.75 12/30/2018   INR 2.11 12/29/2018   No results found for: PTT

## 2018-12-31 NOTE — Progress Notes (Signed)
[facility] Progress Note   Subjective:   Alert, agitated  HD yesterday 1.7L UF  Objective Vitals:   12/31/18 0400 12/31/18 0729 12/31/18 0834 12/31/18 1000  BP: (!) 152/89 121/72 139/68   Pulse: (!) 111 (!) 115 (!) 115 96  Resp: 15 17  (!) 27  Temp:  98.4 F (36.9 C)    TempSrc:  Oral    SpO2: 99% 100%  100%  Weight:      Height:       Physical Exam General: Chronically ill appearing male in NAD Heart: WHQPR-F1,M3 2/6 systolic M. No JVD.  Lungs: slightly decreased in bases otherwise CTAB Abdomen: NT,ND Extremities: Bilateral AKA-ace wraps in place Dialysis Access: L AVF + bruit    Additional Objective Labs: Basic Metabolic Panel: Recent Labs  Lab 12/29/18 0209 12/30/18 0322 12/31/18 0252  NA 140 139 140  K 3.7 4.3 3.5  CL 101 102 101  CO2 29 23 27   GLUCOSE 139* 173* 147*  BUN 30* 48* 17  CREATININE 3.52* 5.38* 3.25*  CALCIUM 8.6* 8.3* 8.2*  PHOS 2.3* 4.8* 3.8   Liver Function Tests: Recent Labs  Lab 12/29/18 0209 12/30/18 0322 12/31/18 0252  ALBUMIN 2.4* 2.2* 2.1*   No results for input(s): LIPASE, AMYLASE in the last 168 hours. CBC: Recent Labs  Lab 12/27/18 0248 12/28/18 0253 12/29/18 0209 12/30/18 0322 12/31/18 0252  WBC 14.1* 15.6* 14.8* 25.7* 15.0*  HGB 10.0* 10.2* 10.0* 8.2* 8.0*  HCT 32.5* 32.6* 32.7* 26.0* 26.0*  MCV 100.9* 98.5 97.3 97.7 97.0  PLT 196 175 159 171 149*   Blood Culture    Component Value Date/Time   SDES BLOOD RIGHT ANTECUBITAL 12/23/2018 1245   SPECREQUEST AEROBIC BOTTLE ONLY Blood Culture adequate volume 12/23/2018 1245   CULT  12/23/2018 1245    NO GROWTH 5 DAYS Performed at [facility] [address] [place], [place] [address]    REPTSTATUS 12/28/2018 FINAL 12/23/2018 1245    Cardiac Enzymes: No results for input(s): CKTOTAL, CKMB, CKMBINDEX, TROPONINI in the last 168 hours. CBG: Recent Labs  Lab 12/29/18 1556 12/30/18 0930  GLUCAP 108* 149*   Iron Studies: No results  for input(s): IRON, TIBC, TRANSFERRIN, FERRITIN in the last 72 hours. @lablastinr3 @ Studies/Results: No results found. Medications:  . acetaminophen  1,000 mg Oral TID  . atorvastatin  20 mg Oral q1800  . chlorhexidine  15 mL Mouth Rinse BID  . darbepoetin (ARANESP) injection - DIALYSIS  150 mcg Intravenous Q Fri-HD  . enoxaparin (LOVENOX) injection  30 mg Subcutaneous Q24H  . feeding supplement (NEPRO CARB STEADY)  237 mL Oral BID BM  . feeding supplement (PRO-STAT SUGAR FREE 64)  30 mL Oral BID  . hydrocerin   Topical BID  . mouth rinse  15 mL Mouth Rinse q12n4p  . metoprolol tartrate  25 mg Oral BID  . midodrine  10 mg Oral Q M,W,F-HD  . multivitamin  1 tablet Oral QHS  . pantoprazole  40 mg Oral BID   Dialysis Orders:  AF MWF 2K 2.25 Ca EDW 69.5 400/A 1.5 profile 2 var NA linear 4hr lt AVF Hect 46mcg IV qtx Mircera 200 mcg q2weeks (last 12/18/18)   No heparin  Assessment/Plan:  1. Fever /L foot amputation site drainage  and right heel eschar/S/P Bilateral AKA today.    2. Afib RVR HR Stable 3. ESRD MWF. HD on schedule. No heparin. .  4. Anemia Aranesp 150 ordered for Friday HD; CTM; drifting down  post surgery 5. 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 Ca 8.6 C Ca 9.9 Hold VDRA.   5 Goals of care -appreciate palliative care conversations with family - DNR; MOST form completed- already at SNF.  6 Dementia at baseline, made worse by acute illness.  7 DM controlled 9 BP/volume-HD 12/28/18 Net UF 1.7 L post wt 67.8 kg. Now under OP EDW. Will need lower EDW post AKAs. BP stable despite arrhythmia.    10 Malnutrition - Nepro/prostat

## 2019-01-01 LAB — PROTIME-INR
INR: 1.65
Prothrombin Time: 19.3 seconds — ABNORMAL HIGH (ref 11.4–15.2)

## 2019-01-01 MED ORDER — PANTOPRAZOLE SODIUM 40 MG PO TBEC: 40.0000 mg | DELAYED_RELEASE_TABLET | Freq: Every day | ORAL | 0 refills | Status: AC

## 2019-01-01 MED ORDER — WARFARIN SODIUM 3 MG PO TABS
3.0000 mg | ORAL_TABLET | Freq: Once | ORAL | Status: AC
  Administered 2019-01-01: 3 mg via ORAL
  Filled 2019-01-01: qty 1

## 2019-01-01 NOTE — Discharge Summary (Addendum)
Physician Discharge Summary  [name] ACZ:[id number] DOB: 11/14/1941 DOA: 12/23/2018  PCP: Hennie Duos, MD  Admit date: 12/23/2018 Discharge date: 01/02/2019 Time spent: 35 minutes  Recommendations for Outpatient Follow-up:  1. Warfarin restarted postop, monitor INR, goal INR is 2-3 for paroxysmal atrial fib 2. Follow-up with vascular surgery Dr. Donzetta Matters in 1 month 3. Daily dressing changes, dry dressings 4. PCP in 1 week 5. Continue hemodialysis Monday Wednesday Friday   Discharge Diagnoses:  Principal Problem:   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[place])   Sepsis   Dementia   OBSTRUCTIVE SLEEP APNEA   PAF (paroxysmal atrial fibrillation) (HCC)   Acute encephalopathy   Multifocal pneumonia   OSA (obstructive sleep apnea)   HTN (hypertension)   ESRD on hemodialysis [facility])   Palliative care encounter   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[place])   Open wound of left foot  Addendum on 01/02/2019: Patient was supposed to be discharged on 01/01/2018.  Patient will be discharged to SNF once bed is available.  Discharge Condition: Stable  Diet recommendation: Renal diabetic  Filed Weights   12/28/18 1138 12/30/18 1207 12/30/18 1615  Weight: 67.8 kg 60 kg 58.6 kg    History of present illness:  [name] a 78 y.o.malewith medical history significant for dementia,ESRD on HD; DM; OSA on CPAP; HTN; HLD; CHF; PVD with L 5th ray amputation on 12/10; and afib on Coumadin presenting with weakness.He has dementia and is unable to give history.  Sent from [place] for subjective fevers, weakness. He underwent left fifth toe amputation on 12/10 secondary to gangrene. The staff spoke with Vascular surgery and was recommended to be sent to the ED  Hospital Course:   Sepsis/ PVD/  bilat LE gangrene w/ infection:  -Admitted with fever leukocytosis and wound with drainage and foul-smelling discharge, had a history of L fem-PT bypass graft 11/15/18 and L toe amp 12/06/18  prior to this admission -He was started on broad-spectrum antibiotics, blood cultures were negative  -Vascular surgery was consulted, underwent bilateral above-knee amputation on 12/29/18 -Antibiotics were discontinued on 1/3, Coumadin was restarted yesterday  -Will be discharged to SNF for rehab -Follow-up with vascular surgery in 2 weeks   Dementia with acute encephalopathy due to infection  - significant memory deficits, overall now improving and more stable cognitively postop  -Briefly required restraints earlier this admission, now off  ESRD on hemodialysis - vol overloaded on admission- back to back dialysis x 2 done, improved - on Midodrine with  Dialysis - gets HD MWF.   PAF (paroxysmal atrial fibrillation)/ coagulopathy - INR * >> 1.7 , resumed warfarin per pharmacy -Continue metoprolol     OBSTRUCTIVE SLEEP APNEA - CPAP ordered  Conjuctivitis- left eye -  Improved, erythromycin ointment dc'd 12/29/17,  follow   h/o of GERD? PUD - on BID Protonix as outpt, change this to daily   Code Status: DNR  Procedures:  Bilateral above-knee amputations 1/2  Consultations:  Renal  Vascular  Discharge Exam: Vitals:   01/01/19 0735 01/01/19 1149  BP: 120/68 127/73  Pulse: (!) 113 (!) 106  Resp: 20 18  Temp: 99 F (37.2 C) 99 F (37.2 C)  SpO2: 96% 100%    General: Awake alert, oriented to self and place only Cardiovascular: S1-S2/regular rate rhythm Respiratory: Clear bilaterally  Discharge Instructions    Allergies as of 01/02/2019      Reactions   Occlusive Silicone Sheets [silicone]    Other  Occlusive adhesive   Tape Itching, Other (See Comments)   Cloth tape only      Medication List    STOP taking these medications   cefTRIAXone 1 g injection Commonly known as:  ROCEPHIN     TAKE these medications   acetaminophen 500 MG tablet Commonly known as:  TYLENOL Take 1,000 mg by mouth 3 (three) times daily.   atorvastatin 20 MG  tablet Commonly known as:  LIPITOR Take 1 tablet (20 mg total) by mouth daily at 6 PM.   calcium acetate 667 MG capsule Commonly known as:  PHOSLO Take 4 capsules (2,668 mg total) by mouth 3 (three) times daily with meals.   doxercalciferol 4 MCG/2ML injection Commonly known as:  HECTOROL Inject 1 mL (2 mcg total) into the vein every Monday, Wednesday, and Friday with hemodialysis. What changed:    how much to take  additional instructions   feeding supplement (PRO-STAT SUGAR FREE 64) Liqd Take 30 mLs by mouth 2 (two) times daily.   ferrous gluconate 240 (27 FE) MG tablet Commonly known as:  FERGON Take 1 tablet (240 mg total) by mouth 3 (three) times daily with meals.   gentamicin 0.3 % ophthalmic solution Commonly known as:  GARAMYCIN Place 2 drops into both eyes 3 (three) times daily. For conjuctivitis   loperamide 2 MG tablet Commonly known as:  IMODIUM A-D Take 4 mg by mouth See admin instructions. Give 4 mg after 1st loose stool as needed for diarrhea   metoprolol tartrate 25 MG tablet Commonly known as:  LOPRESSOR Take 25 mg by mouth 2 (two) times daily. Do not give before dialysis Mon, Wed, and Fri   midodrine 10 MG tablet Commonly known as:  PROAMATINE Take 10 mg by mouth every Monday, Wednesday, and Friday. 30 minutes prior to dialysis treatment   MIRCERA 200 MCG/0.3ML [place] Generic drug:  Methoxy PEG-Epoetin Beta Inject 200 mcg as directed every Monday, Wednesday, and Friday.   multivitamin with minerals Tabs tablet Take 1 tablet by mouth daily.   OVER THE COUNTER MEDICATION at bedtime. BIPAP   oxyCODONE 5 MG immediate release tablet Commonly known as:  ROXICODONE Take 1 tablet (5 mg total) by mouth every 6 (six) hours as needed for severe pain. What changed:    when to take this  reasons to take this   pantoprazole 40 MG tablet Commonly known as:  PROTONIX Take 1 tablet (40 mg total) by mouth daily. What changed:  when to take this   warfarin  5 MG tablet Commonly known as:  COUMADIN Take 1 tablet (5 mg total) by mouth daily at 6 PM. What changed:  how much to take       Allergies  Allergen Reactions  . Occlusive Silicone Sheets [Silicone]   . Other     Occlusive adhesive  . Tape Itching and Other (See Comments)    Cloth tape only      The results of significant diagnostics from this hospitalization (including imaging, microbiology, ancillary and laboratory) are listed below for reference.    Significant Diagnostic Studies: Dg Chest 2 View  Result Date: 12/27/2018 CLINICAL DATA:  Sepsis. EXAM: CHEST - 2 VIEW COMPARISON:  12/25/2018. FINDINGS: Stable cardiomegaly. Improving bilateral pulmonary interstitial prominence consistent improving interstitial edema and/or pneumonitis. No pleural effusion or pneumothorax. No acute bony abnormality. IMPRESSION: 1.  Stable cardiomegaly. 2. Improving bilateral from interstitial prominence consistent improving interstitial edema and/or pneumonitis. Electronically Signed   By: Marcello Moores  Register   On:  12/27/2018 07:35   Dg Chest 2 View  Result Date: 12/25/2018 CLINICAL DATA:  Patient with history of pneumonia. EXAM: CHEST - 2 VIEW COMPARISON:  Chest radiograph 12/23/2018 FINDINGS: Monitoring leads overlie the patient. Stable cardiomegaly. Aortic atherosclerosis. Slight interval improvement in previously described bilateral airspace opacities. Probable small bilateral pleural effusions. No pneumothorax. IMPRESSION: Slight interval improvement bilateral airspace opacities may represent improving edema. Underlying infection not excluded. Electronically Signed   By: Lovey Newcomer M.D.   On: 12/25/2018 09:56   Dg Chest Port 1 View  Result Date: 12/23/2018 CLINICAL DATA:  Lethargy. Leukocytosis. Current history of hypertension, end-stage renal disease, CHF and atrial fibrillation. EXAM: PORTABLE CHEST 1 VIEW COMPARISON:  11/09/2018 and earlier. FINDINGS: Cardiac silhouette moderately enlarged,  unchanged. Thoracic aorta atherosclerotic, unchanged. Airspace opacities throughout both lungs, asymmetric and increased in the RIGHT lung, new since the most recent prior examination 11/09/2018. IMPRESSION: Asymmetric airspace pulmonary edema versus multifocal BILATERAL pneumonia, RIGHT greater than LEFT. Electronically Signed   By: Evangeline Dakin M.D.   On: 12/23/2018 12:00   Vas [place] Burnard Bunting With/wo Tbi  Result Date: 12/13/2018 LOWER EXTREMITY DOPPLER STUDY Indications: Peripheral artery disease, and Bilateral foot bandage. High Risk Factors: Hypertension, Diabetes, past history of smoking.  Vascular Interventions: Atherectomy left superficial femoral-popliteal artery,                         angioplasty left posterior tibial artery 11/15/2018. Performing Technologist: Alvia Grove RVT  Examination Guidelines: A complete evaluation includes at minimum, Doppler waveform signals and systolic blood pressure reading at the level of bilateral brachial, anterior tibial, and posterior tibial arteries, when vessel segments are accessible. Bilateral testing is considered an integral part of a complete examination. Photoelectric Plethysmograph (PPG) waveforms and toe systolic pressure readings are included as required and additional duplex testing as needed. Limited examinations for reoccurring indications may be performed as noted.  ABI Findings: +--------+------------------+-----+----------+--------+ Right   Rt Pressure (mmHg)IndexWaveform  Comment  +--------+------------------+-----+----------+--------+ [id number]                                        +--------+------------------+-----+----------+--------+ PTA     98                1.01 monophasic         +--------+------------------+-----+----------+--------+ DP      99                1.02 monophasic         +--------+------------------+-----+----------+--------+ +--------+------------------+-----+----------+-------+ Left    Lt Pressure  (mmHg)IndexWaveform  Comment +--------+------------------+-----+----------+-------+ Brachial                                 fistula +--------+------------------+-----+----------+-------+ PTA     156               1.61 monophasic        +--------+------------------+-----+----------+-------+ DP      93                0.96 monophasic        +--------+------------------+-----+----------+-------+ +-------+-----------+-----------+------------+------------+ ABI/TBIToday's ABIToday's TBIPrevious ABIPrevious TBI +-------+-----------+-----------+------------+------------+ Right  1.02                  0.63                     +-------+-----------+-----------+------------+------------+  Left   1.61                  0.58                     +-------+-----------+-----------+------------+------------+  Summary: Right: Resting right ankle-brachial index indicates noncompressible right lower extremity arteries. Left: Resting left ankle-brachial index indicates noncompressible left lower extremity arteries. Unable to obtain toe-brachial indeces due to bandages and movement. Technically difficult exam, exam performed in wheel chair.   *See table(s) above for measurements and observations.   Electronically signed by Monica Martinez MD on 12/13/2018 at 9:57:54 AM.    Final     Microbiology: Recent Results (from the past 240 hour(s))  Blood Culture (routine x 2)     Status: None   Collection Time: 12/23/18 11:38 AM  Result Value Ref Range Status   Specimen Description BLOOD RIGHT ANTECUBITAL  Final   Special Requests   Final    BOTTLES DRAWN AEROBIC AND ANAEROBIC Blood Culture adequate volume   Culture   Final    NO GROWTH 5 DAYS Performed at [facility], [address] [place], [place] [address]    Report Status 12/28/2018 FINAL  Final  Blood Culture (routine x 2)     Status: None   Collection Time: 12/23/18 12:45 PM  Result Value Ref Range Status   Specimen  Description BLOOD RIGHT ANTECUBITAL  Final   Special Requests AEROBIC BOTTLE ONLY Blood Culture adequate volume  Final   Culture   Final    NO GROWTH 5 DAYS Performed at [facility], [place] [address] [place], [place] [address]    Report Status 12/28/2018 FINAL  Final  MRSA PCR Screening     Status: None   Collection Time: 12/23/18  5:08 PM  Result Value Ref Range Status   MRSA by PCR NEGATIVE NEGATIVE Final    Comment:        The GeneXpert MRSA Assay (FDA approved for NASAL specimens only), is one component of a comprehensive MRSA colonization surveillance program. It is not intended to diagnose MRSA infection nor to guide or monitor treatment for MRSA infections. Performed at [facility], [place] [address], [place], [place] [address]      Labs: Basic Metabolic Panel: Recent Labs  Lab 12/27/18 0248 12/28/18 0253 12/29/18 0209 12/30/18 0322 12/31/18 0252  NA 137 137 140 139 140  K 4.5 4.4 3.7 4.3 3.5  CL 98 99 101 102 101  CO2 26 25 29 23 27   GLUCOSE 200* 138* 139* 173* 147*  BUN 27* 56* 30* 48* 17  CREATININE 3.13* 5.00* 3.52* 5.38* 3.25*  CALCIUM 8.8* 9.1 8.6* 8.3* 8.2*  PHOS 1.8* 2.4* 2.3* 4.8* 3.8   Liver Function Tests: Recent Labs  Lab 12/27/18 0248 12/28/18 0253 12/29/18 0209 12/30/18 0322 12/31/18 0252  ALBUMIN 2.3* 2.4* 2.4* 2.2* 2.1*   No results for input(s): LIPASE, AMYLASE in the last 168 hours. No results for input(s): AMMONIA in the last 168 hours. CBC: Recent Labs  Lab 12/27/18 0248 12/28/18 0253 12/29/18 0209 12/30/18 0322 12/31/18 0252  WBC 14.1* 15.6* 14.8* 25.7* 15.0*  HGB 10.0* 10.2* 10.0* 8.2* 8.0*  HCT 32.5* 32.6* 32.7* 26.0* 26.0*  MCV 100.9* 98.5 97.3 97.7 97.0  PLT 196 175 159 171 149*   Cardiac Enzymes: No results for input(s): CKTOTAL, CKMB, CKMBINDEX, TROPONINI in the last 168 hours. BNP: BNP (last 3 results) Recent Labs    11/06/18  1905  BNP 1,611.6*    ProBNP (last 3 results) No results for  input(s): PROBNP in the last 8760 hours.  CBG: Recent Labs  Lab 12/29/18 1556 12/30/18 0930  GLUCAP 108* 149*       Signed:  Domenic Polite MD.  [facility] 01/01/2019, 11:52 AM

## 2019-01-01 NOTE — Progress Notes (Signed)
[facility] Progress Note   Subjective:   No interval events  Objective Vitals:   12/31/18 1500 12/31/18 2153 12/31/18 2228 01/01/19 0735  BP: 130/79 133/79  120/68  Pulse: (!) 106 (!) 119 (!) 123 (!) 113  Resp: (!) 24  18 20   Temp: 98 F (36.7 C)   99 F (37.2 C)  TempSrc: Oral   Axillary  SpO2: 99%  97% 96%  Weight:      Height:       Physical Exam General: Chronically ill appearing male in NAD on CPAP Heart: UVOZD-G6,Y4 2/6 systolic M. No JVD.  Lungs: slightly decreased in bases otherwise CTAB Abdomen: NT,ND Extremities: Bilateral AKA-ace wraps in place Dialysis Access: L AVF + bruit    Additional Objective Labs: Basic Metabolic Panel: Recent Labs  Lab 12/29/18 0209 12/30/18 0322 12/31/18 0252  NA 140 139 140  K 3.7 4.3 3.5  CL 101 102 101  CO2 29 23 27   GLUCOSE 139* 173* 147*  BUN 30* 48* 17  CREATININE 3.52* 5.38* 3.25*  CALCIUM 8.6* 8.3* 8.2*  PHOS 2.3* 4.8* 3.8   Liver Function Tests: Recent Labs  Lab 12/29/18 0209 12/30/18 0322 12/31/18 0252  ALBUMIN 2.4* 2.2* 2.1*   No results for input(s): LIPASE, AMYLASE in the last 168 hours. CBC: Recent Labs  Lab 12/27/18 0248 12/28/18 0253 12/29/18 0209 12/30/18 0322 12/31/18 0252  WBC 14.1* 15.6* 14.8* 25.7* 15.0*  HGB 10.0* 10.2* 10.0* 8.2* 8.0*  HCT 32.5* 32.6* 32.7* 26.0* 26.0*  MCV 100.9* 98.5 97.3 97.7 97.0  PLT 196 175 159 171 149*   Blood Culture    Component Value Date/Time   SDES BLOOD RIGHT ANTECUBITAL 12/23/2018 1245   SPECREQUEST AEROBIC BOTTLE ONLY Blood Culture adequate volume 12/23/2018 1245   CULT  12/23/2018 1245    NO GROWTH 5 DAYS Performed at [facility] [address], [place], [place] [address]    REPTSTATUS 12/28/2018 FINAL 12/23/2018 1245    Cardiac Enzymes: No results for input(s): CKTOTAL, CKMB, CKMBINDEX, TROPONINI in the last 168 hours. CBG: Recent Labs  Lab 12/29/18 1556 12/30/18 0930  GLUCAP 108* 149*   Medications:  .  acetaminophen  1,000 mg Oral TID  . atorvastatin  20 mg Oral q1800  . chlorhexidine  15 mL Mouth Rinse BID  . darbepoetin (ARANESP) injection - DIALYSIS  150 mcg Intravenous Q Fri-HD  . enoxaparin (LOVENOX) injection  30 mg Subcutaneous Q24H  . feeding supplement (NEPRO CARB STEADY)  237 mL Oral BID BM  . feeding supplement (PRO-STAT SUGAR FREE 64)  30 mL Oral BID  . hydrocerin   Topical BID  . mouth rinse  15 mL Mouth Rinse q12n4p  . metoprolol tartrate  25 mg Oral BID  . midodrine  10 mg Oral Q M,W,F-HD  . multivitamin  1 tablet Oral QHS  . pantoprazole  40 mg Oral BID  . warfarin  3 mg Oral ONCE-1800  . Warfarin - Pharmacist Dosing Inpatient   Does not apply q1800   Dialysis Orders:  AF MWF 2K 2.25 Ca EDW 69.5 400/A 1.5 profile 2 var NA linear 4hr lt AVF Hect 88mcg IV qtx Mircera 200 mcg q2weeks (last 12/18/18)   No heparin  Assessment/Plan:  1. Fever /L foot amputation site drainage  and right heel eschar/S/P Bilateral AKA today.    2. Afib RVR HR Stable 3. ESRD MWF. HD on schedule. No heparin. .  4. Anemia Aranesp 150 ordered for Friday HD; CTM;  drifting down post surgery 5. 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 Hold VDRA.   5 Goals of care -appreciate palliative care conversations with family - DNR; MOST form completed- already at SNF.  6 Dementia at baseline, made worse by acute illness.  7 DM controlled 9 BP/volume-Will need lower EDW post AKAs. BP stable despite arrhythmia.    10 Malnutrition - Nepro/prostat   HD tomorrow: 3.5h, AVF, 3K, 2Ca, 1-2L UF, No heparin

## 2019-01-01 NOTE — Progress Notes (Signed)
ANTICOAGULATION CONSULT NOTE - Follow Up Consult  Pharmacy Consult for Coumadin Indication: atrial fibrillation  Allergies  Allergen Reactions  . Occlusive Silicone Sheets [Silicone]   . Other     Occlusive adhesive  . Tape Itching and Other (See Comments)    Cloth tape only    Patient Measurements: Height: 5\' 6"  (167.6 cm) Weight: 129 lb 3 oz (58.6 kg) IBW/kg (Calculated) : 63.8 Heparin Dosing Weight: n/a  Vital Signs: Temp: 99 F (37.2 C) (01/05 1149) Temp Source: Axillary (01/05 1149) BP: 127/73 (01/05 1149) Pulse Rate: 106 (01/05 1149)  Labs: Recent Labs    12/30/18 0322 12/31/18 0252 01/01/19 0214  HGB 8.2* 8.0*  --   HCT 26.0* 26.0*  --   PLT 171 149*  --   LABPROT 20.3* 20.5* 19.3*  INR 1.75 1.79 1.65  CREATININE 5.38* 3.25*  --     Estimated Creatinine Clearance: 15.5 mL/min (A) (by C-G formula based on SCr of 3.25 mg/dL (H)).   Medical History: Past Medical History:  Diagnosis Date  . A-fib ([place])   . Anemia   . Blood transfusion   . BPH (benign prostatic hyperplasia)   . CHF (congestive heart failure) ([place])   . Diarrhea   . DM (diabetes mellitus) ([place])   . ESRD on hemodialysis ([place])    Started dialysis in 2009  . History of GI bleed    secondary to coumadin  . HTN (hypertension)   . Hyperlipidemia   . OSA (obstructive sleep apnea)    uses CPAP  . Secondary hyperparathyroidism of renal origin ([place])     Medications:  Scheduled:  . acetaminophen  1,000 mg Oral TID  . atorvastatin  20 mg Oral q1800  . chlorhexidine  15 mL Mouth Rinse BID  . darbepoetin (ARANESP) injection - DIALYSIS  150 mcg Intravenous Q Fri-HD  . enoxaparin (LOVENOX) injection  30 mg Subcutaneous Q24H  . feeding supplement (NEPRO CARB STEADY)  237 mL Oral BID BM  . feeding supplement (PRO-STAT SUGAR FREE 64)  30 mL Oral BID  . hydrocerin   Topical BID  . mouth rinse  15 mL Mouth Rinse q12n4p  . metoprolol tartrate  25 mg Oral BID  . midodrine  10 mg Oral Q M,W,F-HD   . multivitamin  1 tablet Oral QHS  . pantoprazole  40 mg Oral BID  . warfarin  3 mg Oral ONCE-1800  . Warfarin - Pharmacist Dosing Inpatient   Does not apply q1800    Assessment: 11 yoM on warfarin PTA for PAF. Pt admitted with elevated INR which was held. 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 INR today 1.65, pharmacy consulted to resume warfarin last night (1/4), but per RN he was too sleepy for oral intake and never got coumadin dose.  *Warfarin PTA dose: 3 mg daily  Goal of Therapy:  INR 2-3 Monitor platelets by anticoagulation protocol: Yes   Plan:  -Warfarin 3 mg x 1 tonight. -Daily INR.  [name], [facility] Clinical Pharmacist Phone [phone]  01/01/2019 3:18 PM

## 2019-01-02 ENCOUNTER — Telehealth: Payer: Self-pay | Admitting: Vascular Surgery

## 2019-01-02 ENCOUNTER — Other Ambulatory Visit: Payer: Self-pay | Admitting: Internal Medicine

## 2019-01-02 LAB — CBC
HCT: 26.7 % — ABNORMAL LOW (ref 39.0–52.0)
Hemoglobin: 7.9 g/dL — ABNORMAL LOW (ref 13.0–17.0)
MCH: 29.2 pg (ref 26.0–34.0)
MCHC: 29.6 g/dL — ABNORMAL LOW (ref 30.0–36.0)
MCV: 98.5 fL (ref 80.0–100.0)
Platelets: 238 10*3/uL (ref 150–400)
RBC: 2.71 MIL/uL — ABNORMAL LOW (ref 4.22–5.81)
RDW: 18 % — ABNORMAL HIGH (ref 11.5–15.5)
WBC: 13.4 10*3/uL — ABNORMAL HIGH (ref 4.0–10.5)
nRBC: 0.1 % (ref 0.0–0.2)

## 2019-01-02 LAB — RENAL FUNCTION PANEL
Albumin: 2 g/dL — ABNORMAL LOW (ref 3.5–5.0)
Anion gap: 17 — ABNORMAL HIGH (ref 5–15)
BUN: 64 mg/dL — ABNORMAL HIGH (ref 8–23)
CO2: 22 mmol/L (ref 22–32)
Calcium: 7.8 mg/dL — ABNORMAL LOW (ref 8.9–10.3)
Chloride: 98 mmol/L (ref 98–111)
Creatinine, Ser: 8.36 mg/dL — ABNORMAL HIGH (ref 0.61–1.24)
GFR calc Af Amer: 6 mL/min — ABNORMAL LOW (ref >60)
GFR calc non Af Amer: 6 mL/min — ABNORMAL LOW (ref >60)
Glucose, Bld: 175 mg/dL — ABNORMAL HIGH (ref 70–99)
POTASSIUM: 3.9 mmol/L (ref 3.5–5.1)
Phosphorus: 6.2 mg/dL — ABNORMAL HIGH (ref 2.5–4.6)
Sodium: 137 mmol/L (ref 135–145)

## 2019-01-02 LAB — PROTIME-INR
INR: 1.7
Prothrombin Time: 19.7 seconds — ABNORMAL HIGH (ref 11.4–15.2)

## 2019-01-02 MED ORDER — WARFARIN SODIUM 3 MG PO TABS
3.0000 mg | ORAL_TABLET | Freq: Once | ORAL | Status: AC
  Administered 2019-01-02: 3 mg via ORAL
  Filled 2019-01-02: qty 1

## 2019-01-02 MED ORDER — OXYCODONE HCL 5 MG PO TABS: 5.0000 mg | ORAL_TABLET | Freq: Four times a day (QID) | ORAL | 0 refills | Status: DC | PRN

## 2019-01-02 NOTE — Care Management Important Message (Signed)
Important Message  Patient Details  Name: [name] MRN: [id number] Date of Birth: [birth year]   Medicare Important Message Given:  Yes    Barb Merino Marquette 01/02/2019, 3:45 PM

## 2019-01-02 NOTE — Progress Notes (Signed)
[facility] Progress Note   Subjective:   No new c/o's, due for HD this aftgernoon then dc to SNF.   Objective Vitals:   01/01/19 2200 01/01/19 2304 01/02/19 0057 01/02/19 0753  BP: 104/71  109/64 106/67  Pulse: (!) 105 (!) 105 100   Resp:  18 15   Temp:   99.3 F (37.4 C) 99 F (37.2 C)  TempSrc:   Oral Oral  SpO2: 94% 98% 99%   Weight:      Height:       Physical Exam General: Chronically ill appearing male in NAD on CPAP Heart: VQQVZ-D6,L8 2/6 systolic M. No JVD.  Lungs: slightly decreased in bases otherwise CTAB Abdomen: NT,ND Extremities: Bilateral AKA-ace wraps in place Dialysis Access: L AVF + bruit    Additional Objective Labs: Basic Metabolic Panel: Recent Labs  Lab 12/29/18 0209 12/30/18 0322 12/31/18 0252  NA 140 139 140  K 3.7 4.3 3.5  CL 101 102 101  CO2 29 23 27   GLUCOSE 139* 173* 147*  BUN 30* 48* 17  CREATININE 3.52* 5.38* 3.25*  CALCIUM 8.6* 8.3* 8.2*  PHOS 2.3* 4.8* 3.8   Liver Function Tests: Recent Labs  Lab 12/29/18 0209 12/30/18 0322 12/31/18 0252  ALBUMIN 2.4* 2.2* 2.1*   No results for input(s): LIPASE, AMYLASE in the last 168 hours. CBC: Recent Labs  Lab 12/27/18 0248 12/28/18 0253 12/29/18 0209 12/30/18 0322 12/31/18 0252  WBC 14.1* 15.6* 14.8* 25.7* 15.0*  HGB 10.0* 10.2* 10.0* 8.2* 8.0*  HCT 32.5* 32.6* 32.7* 26.0* 26.0*  MCV 100.9* 98.5 97.3 97.7 97.0  PLT 196 175 159 171 149*   Blood Culture    Component Value Date/Time   SDES BLOOD RIGHT ANTECUBITAL 12/23/2018 1245   SPECREQUEST AEROBIC BOTTLE ONLY Blood Culture adequate volume 12/23/2018 1245   CULT  12/23/2018 1245    NO GROWTH 5 DAYS Performed at [facility] [address], [place], [place] [address]    REPTSTATUS 12/28/2018 FINAL 12/23/2018 1245    Cardiac Enzymes: No results for input(s): CKTOTAL, CKMB, CKMBINDEX, TROPONINI in the last 168 hours. CBG: Recent Labs  Lab 12/29/18 1556 12/30/18 0930  GLUCAP 108* 149*    Medications:  . acetaminophen  1,000 mg Oral TID  . atorvastatin  20 mg Oral q1800  . chlorhexidine  15 mL Mouth Rinse BID  . darbepoetin (ARANESP) injection - DIALYSIS  150 mcg Intravenous Q Fri-HD  . enoxaparin (LOVENOX) injection  30 mg Subcutaneous Q24H  . feeding supplement (NEPRO CARB STEADY)  237 mL Oral BID BM  . feeding supplement (PRO-STAT SUGAR FREE 64)  30 mL Oral BID  . hydrocerin   Topical BID  . mouth rinse  15 mL Mouth Rinse q12n4p  . metoprolol tartrate  25 mg Oral BID  . midodrine  10 mg Oral Q M,W,F-HD  . multivitamin  1 tablet Oral QHS  . pantoprazole  40 mg Oral BID  . warfarin  3 mg Oral ONCE-1800  . Warfarin - Pharmacist Dosing Inpatient   Does not apply q1800   Dialysis Orders:  AF MWF 2K 2.25 Ca EDW 69.5 400/A 1.5 profile 2 var NA linear 4hr lt AVF Hect 51mcg IV qtx Mircera 200 mcg q2weeks (last 12/18/18)   No heparin  Assessment/Plan:  1. L foot infection / amp site drainage/ R heel eschar: sp bilat AKA 01/01/19 2. Afib RVR HR Stable 3. ESRD MWF. HD today 4. Anemia Aranesp 150 ordered for Friday HD; CTM; drifting  down post surgery 5. 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 Hold VDRA.   5 Goals of care -appreciate palliative care conversations with family - DNR; MOST form completed- already at SNF.  6 Dementia at baseline, made worse by acute illness.  7 DM controlled 9 BP/volume-Will need lower EDW post AKAs. BP stable despite arrhythmia.    10 Malnutrition - Nepro/prostat   P: HD today, min UF

## 2019-01-02 NOTE — Progress Notes (Signed)
Pt came back from HD and BT 100.6 HR 110's-120's. Administered metoprolol morning dose. Dr. Starla Link notified this matter and rechecked BT 99.9 after one hour patient received tylenol. Dr. Starla Link ordered Blood culture x 2 and patient is staying in the hospital. [facility] Truman Hayward RN

## 2019-01-02 NOTE — Progress Notes (Signed)
ANTICOAGULATION CONSULT NOTE - Follow Up Consult  Pharmacy Consult for Coumadin Indication: atrial fibrillation  Allergies  Allergen Reactions  . Occlusive Silicone Sheets [Silicone]   . Other     Occlusive adhesive  . Tape Itching and Other (See Comments)    Cloth tape only    Patient Measurements: Height: 5\' 6"  (167.6 cm) Weight: 129 lb 3 oz (58.6 kg) IBW/kg (Calculated) : 63.8 Heparin Dosing Weight: n/a  Vital Signs: Temp: 99 F (37.2 C) (01/06 0753) Temp Source: Oral (01/06 0753) BP: 106/67 (01/06 0753) Pulse Rate: 100 (01/06 0057)  Labs: Recent Labs    12/31/18 0252 01/01/19 0214 01/02/19 0256  HGB 8.0*  --   --   HCT 26.0*  --   --   PLT 149*  --   --   LABPROT 20.5* 19.3* 19.7*  INR 1.79 1.65 1.70  CREATININE 3.25*  --   --     Estimated Creatinine Clearance: 15.5 mL/min (A) (by C-G formula based on SCr of 3.25 mg/dL (H)).   Medical History: Past Medical History:  Diagnosis Date  . A-fib ([place])   . Anemia   . Blood transfusion   . BPH (benign prostatic hyperplasia)   . CHF (congestive heart failure) ([place])   . Diarrhea   . DM (diabetes mellitus) ([place])   . ESRD on hemodialysis ([place])    Started dialysis in 2009  . History of GI bleed    secondary to coumadin  . HTN (hypertension)   . Hyperlipidemia   . OSA (obstructive sleep apnea)    uses CPAP  . Secondary hyperparathyroidism of renal origin ([place])     Medications:  Scheduled:  . acetaminophen  1,000 mg Oral TID  . atorvastatin  20 mg Oral q1800  . chlorhexidine  15 mL Mouth Rinse BID  . darbepoetin (ARANESP) injection - DIALYSIS  150 mcg Intravenous Q Fri-HD  . enoxaparin (LOVENOX) injection  30 mg Subcutaneous Q24H  . feeding supplement (NEPRO CARB STEADY)  237 mL Oral BID BM  . feeding supplement (PRO-STAT SUGAR FREE 64)  30 mL Oral BID  . hydrocerin   Topical BID  . mouth rinse  15 mL Mouth Rinse q12n4p  . metoprolol tartrate  25 mg Oral BID  . midodrine  10 mg Oral Q M,W,F-HD  .  multivitamin  1 tablet Oral QHS  . pantoprazole  40 mg Oral BID  . Warfarin - Pharmacist Dosing Inpatient   Does not apply q1800    Assessment: 32 yoM on warfarin PTA for PAF. Pt admitted with elevated INR which was held. 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 INR today 1.70, discharged pending likely today.  *Warfarin PTA dose: 3 mg daily  Goal of Therapy:  INR 2-3 Monitor platelets by anticoagulation protocol: Yes   Plan:  -Recommend warfarin 3mg  daily at discharge  [name], PharmD, BCPS Clinical Pharmacist [phone] Please check AMION for all [facility] numbers 01/02/2019

## 2019-01-02 NOTE — Telephone Encounter (Signed)
-----   Message from Mena Goes, RN sent at 01/02/2019  9:03 AM EST ----- Regarding: 1 month staple removal  ----- Message ----- From: Elam Dutch, MD Sent: 12/31/2018   2:02 PM EST To: [facility] Charge Pool  Pt needs follow up 1 month for staple removal  He can come off [facility]

## 2019-01-02 NOTE — Progress Notes (Addendum)
Pt will DC to: [place] date:01/03/19 Family notified:[name], daughter  Transport TM:MITV  RN, patient, and facility notified of DC. Discharge Summary sent to facility. RN given number for [phone]. DC packet on chart. Ambulance transport requested for patient.   CSW signing off. Thurmond Butts, [place] Social Worker [phone]

## 2019-01-02 NOTE — Progress Notes (Signed)
CSW spoke with the patient's daughter, [name] and she confirmed the patient will return to [employer] [facility] [facility] once he is medically stable.   Thurmond Butts, [place] Social Worker [phone]

## 2019-01-02 NOTE — Plan of Care (Signed)
  Problem: Education: Goal: Knowledge of General Education information will improve Description Including pain rating scale, medication(s)/side effects and non-pharmacologic comfort measures Outcome: Progressing   

## 2019-01-02 NOTE — Progress Notes (Signed)
Patient ID: GREY RAKESTRAW, male   DOB: Apr 26, 1941, 78 y.o.   MRN: [id number] Patient was supposed to be discharged on 01/01/2018.  Patient seen and examined at bedside.  He is medically stable for discharge.  Please refer to the discharge summary done by Dr. Broadus John on 01/01/2018 for full details.

## 2019-01-02 NOTE — Telephone Encounter (Signed)
appt spk to pt daughter mld ltr 02/01/2019 1pm staple removal

## 2019-01-03 ENCOUNTER — Ambulatory Visit: Payer: Medicare Other | Admitting: Vascular Surgery

## 2019-01-03 LAB — PROTIME-INR
INR: 1.65
PROTHROMBIN TIME: 19.3 s — AB (ref 11.4–15.2)

## 2019-01-03 MED ORDER — WARFARIN SODIUM 3 MG PO TABS: 3.0000 mg | ORAL_TABLET | Freq: Once | ORAL | Status: DC

## 2019-01-03 NOTE — Progress Notes (Signed)
   Bilateral above-knee amputation sites appear to be progressing well with staples in place.  [employer] shrinkers have been ordered.  He has follow-up in 4 weeks for staple removal.  Please call with additional questions.  Sarae Nicholes C. Donzetta Matters, MD [facility] Office: [phone] Pager: [phone]

## 2019-01-03 NOTE — Progress Notes (Signed)
  Progress Note    01/03/2019 8:01 AM 5 Days Post-Op  Subjective:  Denies pain  Afebrile  Vitals:   01/03/19 0300 01/03/19 0746  BP: (!) 101/59   Pulse: 95 (!) 102  Resp: 19 (!) 29  Temp: 98.1 F (36.7 C) 98.3 F (36.8 C)  SpO2: 100% 96%    Physical Exam: Incisions:  Bilateral amp incisions are clean and dry with staples in tact.   CBC    Component Value Date/Time   WBC 13.4 (H) 01/02/2019 1210   RBC 2.71 (L) 01/02/2019 1210   HGB 7.9 (L) 01/02/2019 1210   HCT 26.7 (L) 01/02/2019 1210   HCT 27.1 (L) 11/09/2018 0230   PLT 238 01/02/2019 1210   MCV 98.5 01/02/2019 1210   MCH 29.2 01/02/2019 1210   MCHC 29.6 (L) 01/02/2019 1210   RDW 18.0 (H) 01/02/2019 1210   LYMPHSABS 0.9 12/24/2018 0227   MONOABS 1.2 (H) 12/24/2018 0227   EOSABS 0.2 12/24/2018 0227   BASOSABS 0.1 12/24/2018 0227    BMET    Component Value Date/Time   NA 137 01/02/2019 1210   NA 144 12/22/2018   K 3.9 01/02/2019 1210   CL 98 01/02/2019 1210   CO2 22 01/02/2019 1210   GLUCOSE 175 (H) 01/02/2019 1210   BUN 64 (H) 01/02/2019 1210   BUN 65 (A) 12/22/2018   CREATININE 8.36 (H) 01/02/2019 1210   CALCIUM 7.8 (L) 01/02/2019 1210   CALCIUM 8.3 (L) 10/25/2008 1450   GFRNONAA 6 (L) 01/02/2019 1210   GFRAA 6 (L) 01/02/2019 1210    INR    Component Value Date/Time   INR 1.65 01/03/2019 0251     Intake/Output Summary (Last 24 hours) at 01/03/2019 0801 Last data filed at 01/02/2019 1550 Gross per 24 hour  Intake 300 ml  Output 1000 ml  Net -700 ml     Assessment/Plan:  78 y.o. male is s/p bilaterally above knee amputations  5 Days Post-Op  -pt's incisions are healing nicely -will order stump socks from [employer] -f/u in 4 weeks for staple removal.   Leontine Locket, PA-C [facility] [phone] 01/03/2019 8:01 AM

## 2019-01-03 NOTE — Progress Notes (Signed)
Patient was not placed on his CPAP for the night due to mittens on his hands which is against [facility] NIV protocol (form of restaints). Patient is stable at this time. No distress or complications noted.

## 2019-01-03 NOTE — Progress Notes (Signed)
ANTICOAGULATION CONSULT NOTE - Follow Up Consult  Pharmacy Consult for Coumadin Indication: atrial fibrillation  Allergies  Allergen Reactions  . Occlusive Silicone Sheets [Silicone]   . Other     Occlusive adhesive  . Tape Itching and Other (See Comments)    Cloth tape only    Patient Measurements: Height: 5\' 6"  (167.6 cm) Weight: 128 lb 8.5 oz (58.3 kg) IBW/kg (Calculated) : 63.8 Heparin Dosing Weight: n/a  Vital Signs: Temp: 98.3 F (36.8 C) (01/07 0746) Temp Source: Oral (01/07 0746) BP: 101/52 (01/07 0943) Pulse Rate: 99 (01/07 0943)  Labs: Recent Labs    01/01/19 0214 01/02/19 0256 01/02/19 1210 01/03/19 0251  HGB  --   --  7.9*  --   HCT  --   --  26.7*  --   PLT  --   --  238  --   LABPROT 19.3* 19.7*  --  19.3*  INR 1.65 1.70  --  1.65  CREATININE  --   --  8.36*  --     Estimated Creatinine Clearance: 6 mL/min (A) (by C-G formula based on SCr of 8.36 mg/dL (H)).   Medical History: Past Medical History:  Diagnosis Date  . A-fib ([place])   . Anemia   . Blood transfusion   . BPH (benign prostatic hyperplasia)   . CHF (congestive heart failure) ([name])   . Diarrhea   . DM (diabetes mellitus) ([name])   . ESRD on hemodialysis ([name])    Started dialysis in 2009  . History of GI bleed    secondary to coumadin  . HTN (hypertension)   . Hyperlipidemia   . OSA (obstructive sleep apnea)    uses CPAP  . Secondary hyperparathyroidism of renal origin ([name])     Medications:  Scheduled:  . acetaminophen  1,000 mg Oral TID  . atorvastatin  20 mg Oral q1800  . chlorhexidine  15 mL Mouth Rinse BID  . darbepoetin (ARANESP) injection - DIALYSIS  150 mcg Intravenous Q Fri-HD  . enoxaparin (LOVENOX) injection  30 mg Subcutaneous Q24H  . feeding supplement (NEPRO CARB STEADY)  237 mL Oral BID BM  . feeding supplement (PRO-STAT SUGAR FREE 64)  30 mL Oral BID  . hydrocerin   Topical BID  . mouth rinse  15 mL Mouth Rinse q12n4p  . metoprolol tartrate  25 mg Oral BID   . midodrine  10 mg Oral Q M,W,F-HD  . multivitamin  1 tablet Oral QHS  . pantoprazole  40 mg Oral BID  . Warfarin - Pharmacist Dosing Inpatient   Does not apply q1800    Assessment: 61 yoM on warfarin PTA for PAF. Pt admitted with elevated INR which was held. 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 INR today 1.65, discharged pending likely today.  *Warfarin PTA dose: 3 mg daily  Goal of Therapy:  INR 2-3 Monitor platelets by anticoagulation protocol: Yes   Plan:  -Recommend warfarin 3mg  daily tonight and at discharge  [name], PharmD, BCPS Clinical Pharmacist [phone] Please check AMION for all [place] numbers 01/03/2019

## 2019-01-03 NOTE — Evaluation (Signed)
Physical Therapy Evaluation Patient Details Name: [name] MRN: [id number] DOB: 10/23/1941 Today's Date: 01/03/2019   History of Present Illness  Pt adm from SNF with symptomatic anemia due to GI bleed. 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 PMH A-fib, CHF, CKD, DM, HTN, esrd on hd, lt toe amputation  Clinical Impression  Pt admitted with above diagnosis and presents to PT with functional limitations due to deficits listed below (See PT problem list). Pt needs skilled PT to maximize independence and safety to allow discharge to return to SNF for further rehab after new bilateral AKA.      Follow Up Recommendations SNF;Supervision/Assistance - 24 hour    Equipment Recommendations  None recommended by PT    Recommendations for Other Services       Precautions / Restrictions Precautions Precautions: Fall      Mobility  Bed Mobility Overal bed mobility: Needs Assistance Bed Mobility: Supine to Sit;Sit to Supine     Supine to sit: HOB elevated;Max assist Sit to supine: Mod assist;HOB elevated   General bed mobility comments: Assist to elevate trunk into sitting. Sat in long sit position didn't pivot to EOB  Transfers Overall transfer level: Needs assistance               General transfer comment: Scooted posteriorly in sitting position up to [facility]  Ambulation/Gait                Stairs            Wheelchair Mobility    Modified Rankin (Stroke Patients Only)       Balance Overall balance assessment: Needs assistance Sitting-balance support: Bilateral upper extremity supported;Feet unsupported Sitting balance-Leahy Scale: Poor Sitting balance - Comments: UE support holding bil bed rails and min gaurd to sit x 6 minutes                                     Pertinent Vitals/Pain Pain Assessment: Faces Faces Pain Scale: Hurts little more Pain Location: bilateral lower extremities Pain Descriptors / Indicators:  Sore Pain Intervention(s): Monitored during session;Repositioned    Home Living Family/patient expects to be discharged to:: Skilled nursing facility                      Prior Function Level of Independence: Needs assistance   Gait / Transfers Assistance Needed: Per old chart pt was amb with walker and assistance when hospitalized in Nov. Unsure of status since then.           Hand Dominance        Extremity/Trunk Assessment   Upper Extremity Assessment Upper Extremity Assessment: Generalized weakness    Lower Extremity Assessment Lower Extremity Assessment: Generalized weakness       Communication   Communication: No difficulties  Cognition Arousal/Alertness: Awake/alert Behavior During Therapy: Flat affect Overall Cognitive Status: No family/caregiver present to determine baseline cognitive functioning Area of Impairment: Memory;Following commands;Safety/judgement;Problem solving                     Memory: Decreased short-term memory Following Commands: Follows one step commands consistently;Follows one step commands with increased time Safety/Judgement: Decreased awareness of safety;Decreased awareness of deficits   Problem Solving: Slow processing;Decreased initiation;Requires verbal cues;Requires tactile cues        General Comments      Exercises  Assessment/Plan    PT Assessment Patient needs continued PT services  PT Problem List Decreased strength;Decreased activity tolerance;Decreased balance;Decreased mobility;Decreased knowledge of use of DME;Decreased safety awareness;Decreased knowledge of precautions;Pain       PT Treatment Interventions DME instruction;Functional mobility training;Therapeutic activities;Therapeutic exercise;Balance training;Patient/family education    PT Goals (Current goals can be found in the Care Plan section)  Acute Rehab PT Goals Patient Stated Goal: Pt didn't state PT Goal Formulation: Patient  unable to participate in goal setting Time For Goal Achievement: 01/17/19 Potential to Achieve Goals: Fair    Frequency Min 2X/week   Barriers to discharge        Co-evaluation               AM-PAC PT "6 Clicks" Mobility  Outcome Measure Help needed turning from your back to your side while in a flat bed without using bedrails?: A Little Help needed moving from lying on your back to sitting on the side of a flat bed without using bedrails?: A Lot Help needed moving to and from a bed to a chair (including a wheelchair)?: Total Help needed standing up from a chair using your arms (e.g., wheelchair or bedside chair)?: Total Help needed to walk in hospital room?: Total Help needed climbing 3-5 steps with a railing? : Total 6 Click Score: 9    End of Session   Activity Tolerance: Patient tolerated treatment well Patient left: in bed;with call bell/phone within reach;Other (comment)(on low bed) Nurse Communication: Mobility status PT Visit Diagnosis: Other abnormalities of gait and mobility (R26.89);Muscle weakness (generalized) (M62.81);Difficulty in walking, not elsewhere classified (R26.2)    Time: 3235-5732 PT Time Calculation (min) (ACUTE ONLY): 19 min   Charges:   PT Evaluation $PT Eval Moderate Complexity: [place] Pager [phone] Office [place] 01/03/2019, 10:14 AM

## 2019-01-03 NOTE — Progress Notes (Signed)
Give a report to Therapist, sports at [employer]. V/S was stable. Removed PIV access. Patient had lunch and PTAR picked up the patient to [employer].  HS [employer]

## 2019-01-03 NOTE — Discharge Summary (Signed)
Physician Discharge Summary  [name] UXN:[id number] DOB: 08-Oct-1941 DOA: 12/23/2018  PCP: Hennie Duos, MD  Admit date: 12/23/2018 Discharge date: 01/03/2019 Time spent: 35 minutes  Recommendations for Outpatient Follow-up:  1. Warfarin restarted postop, monitor INR, goal INR is 2-3 for paroxysmal atrial fib 2. Follow-up with vascular surgery Dr. Donzetta Matters in 1 month 3. Daily dressing changes, dry dressings 4. Follow-up with SNF provider at earliest convenience 5. Continue hemodialysis as per nephrology schedule   Discharge Diagnoses:  Principal Problem:   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[place])   Sepsis   Dementia   OBSTRUCTIVE SLEEP APNEA   PAF (paroxysmal atrial fibrillation) ([facility])   Acute encephalopathy   Multifocal pneumonia   OSA (obstructive sleep apnea)   HTN (hypertension)   ESRD on hemodialysis [facility])   Palliative care encounter   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[place])   Open wound of left foot  Addendum on 01/02/2019: Patient was supposed to be discharged on 01/01/2018.  Patient will be discharged to SNF once bed is available.  Addendum on 01/03/2019: Patient was supposed to be discharged on 01/02/2019 but he had a temperature of 100.6 after dialysis and was slightly tachycardic.  Blood cultures were sent.  Subsequently he has not had any more temperature spikes.  He looks hemodynamically stable.  He will be discharged to SNF once bed is available.  Discharge Condition: Stable  Diet recommendation: Renal diabetic  Filed Weights   12/30/18 1615 01/02/19 1207 01/02/19 1550  Weight: 58.6 kg 59.5 kg 58.3 kg    History of present illness:  [name] a 78 y.o.malewith medical history significant for dementia,ESRD on HD; DM; OSA on CPAP; HTN; HLD; CHF; PVD with L 5th ray amputation on 12/10; and afib on Coumadin presenting with weakness.He has dementia and is unable to give history.  Sent from [facility] for subjective fevers, weakness. He  underwent left fifth toe amputation on 12/10 secondary to gangrene. The staff spoke with Vascular surgery and was recommended to be sent to the ED  Hospital Course:   Sepsis/ PVD/  bilat LE gangrene w/ infection:  -Admitted with fever leukocytosis and wound with drainage and foul-smelling discharge, had a history of L fem-PT bypass graft 11/15/18 and L toe amp 12/06/18 prior to this admission -He was started on broad-spectrum antibiotics, blood cultures were negative  -Vascular surgery was consulted, underwent bilateral above-knee amputation on 12/29/18 -Antibiotics were discontinued on 1/3; Coumadin has already been resumed. -Will be discharged to SNF for rehab -Follow-up with vascular surgery in 2 weeks   Dementia with acute encephalopathy due to infection  - significant memory deficits, overall now improving and more stable cognitively postop  -Briefly required restraints earlier this admission, now off -Monitor mental status.  Fall precautions  ESRD on hemodialysis - vol overloaded on admission- back to back dialysis x 2 done, improved - on Midodrine with  Dialysis - gets HD MWF.   PAF (paroxysmal atrial fibrillation)/ coagulopathy -Continue warfarin.  Monitor INR. -Continue metoprolol     OBSTRUCTIVE SLEEP APNEA -Continue CPAP  Conjuctivitis- left eye -  Improved, erythromycin ointment dc'd 12/29/17,  follow   h/o of GERD? PUD - on BID Protonix as outpt, change this to daily   Code Status: DNR  Procedures:  Bilateral above-knee amputations 1/2  Consultations:  Renal  Vascular  Discharge Exam: Patient seen and examined at bedside.  He is awake but slightly but seems to be at his baseline.  Had fever  last night but no worsening cough, vomiting or diarrhea. Vitals:   01/03/19 0300 01/03/19 0746  BP: (!) 101/59   Pulse: 95 (!) 102  Resp: 19 (!) 29  Temp: 98.1 F (36.7 C) 98.3 F (36.8 C)  SpO2: 100% 96%    General: Awake, slightly confused, which seems to  be at his baseline  cardiovascular: Rate controlled, S1-S2 positive  respiratory: Bilateral decreased breath sounds at bases Extremities: Bilateral AKA stumps devoid of any erythema or drainage  Discharge Instructions   Discharge Instructions    Diet - low sodium heart healthy   Complete by:  As directed    Discharge instructions   Complete by:  As directed    Fall precautions Renal hemodialysis diet   Increase activity slowly   Complete by:  As directed      Allergies as of 01/03/2019      Reactions   Occlusive Silicone Sheets [silicone]    Other    Occlusive adhesive   Tape Itching, Other (See Comments)   Cloth tape only      Medication List    STOP taking these medications   cefTRIAXone 1 g injection Commonly known as:  ROCEPHIN   oxyCODONE 5 MG immediate release tablet Commonly known as:  ROXICODONE     TAKE these medications   acetaminophen 500 MG tablet Commonly known as:  TYLENOL Take 1,000 mg by mouth 3 (three) times daily.   atorvastatin 20 MG tablet Commonly known as:  LIPITOR Take 1 tablet (20 mg total) by mouth daily at 6 PM.   calcium acetate 667 MG capsule Commonly known as:  PHOSLO Take 4 capsules (2,668 mg total) by mouth 3 (three) times daily with meals.   doxercalciferol 4 MCG/2ML injection Commonly known as:  HECTOROL Inject 1 mL (2 mcg total) into the vein every Monday, Wednesday, and Friday with hemodialysis. What changed:    how much to take  additional instructions   feeding supplement (PRO-STAT SUGAR FREE 64) Liqd Take 30 mLs by mouth 2 (two) times daily.   ferrous gluconate 240 (27 FE) MG tablet Commonly known as:  FERGON Take 1 tablet (240 mg total) by mouth 3 (three) times daily with meals.   gentamicin 0.3 % ophthalmic solution Commonly known as:  GARAMYCIN Place 2 drops into both eyes 3 (three) times daily. For conjuctivitis   loperamide 2 MG tablet Commonly known as:  IMODIUM A-D Take 4 mg by mouth See admin  instructions. Give 4 mg after 1st loose stool as needed for diarrhea   metoprolol tartrate 25 MG tablet Commonly known as:  LOPRESSOR Take 25 mg by mouth 2 (two) times daily. Do not give before dialysis Mon, Wed, and Fri   midodrine 10 MG tablet Commonly known as:  PROAMATINE Take 10 mg by mouth every Monday, Wednesday, and Friday. 30 minutes prior to dialysis treatment   MIRCERA 200 MCG/0.3ML Sosy Generic drug:  Methoxy PEG-Epoetin Beta Inject 200 mcg as directed every Monday, Wednesday, and Friday.   multivitamin with minerals Tabs tablet Take 1 tablet by mouth daily.   OVER THE COUNTER MEDICATION at bedtime. BIPAP   pantoprazole 40 MG tablet Commonly known as:  PROTONIX Take 1 tablet (40 mg total) by mouth daily. What changed:  when to take this   warfarin 5 MG tablet Commonly known as:  COUMADIN Take 1 tablet (5 mg total) by mouth daily at 6 PM. What changed:  how much to take  Allergies  Allergen Reactions  . Occlusive Silicone Sheets [Silicone]   . Other     Occlusive adhesive  . Tape Itching and Other (See Comments)    Cloth tape only   Follow-up Information    Hennie Duos, MD. Schedule an appointment as soon as possible for a visit in 1 week.   Specialty:  Internal Medicine Contact information: [place] [address] [phone]        Waynetta Sandy, MD In 4 weeks.   Specialties:  Vascular Surgery, Cardiology Why:  Office will call you to arrange your appt (sent) Contact information: [place] [address] [phone]            The results of significant diagnostics from this hospitalization (including imaging, microbiology, ancillary and laboratory) are listed below for reference.    Significant Diagnostic Studies: Dg Chest 2 View  Result Date: 12/27/2018 CLINICAL DATA:  Sepsis. EXAM: CHEST - 2 VIEW COMPARISON:  12/25/2018. FINDINGS: Stable cardiomegaly. Improving bilateral pulmonary  interstitial prominence consistent improving interstitial edema and/or pneumonitis. No pleural effusion or pneumothorax. No acute bony abnormality. IMPRESSION: 1.  Stable cardiomegaly. 2. Improving bilateral from interstitial prominence consistent improving interstitial edema and/or pneumonitis. Electronically Signed   By: Marcello Moores  Register   On: 12/27/2018 07:35   Dg Chest 2 View  Result Date: 12/25/2018 CLINICAL DATA:  Patient with history of pneumonia. EXAM: CHEST - 2 VIEW COMPARISON:  Chest radiograph 12/23/2018 FINDINGS: Monitoring leads overlie the patient. Stable cardiomegaly. Aortic atherosclerosis. Slight interval improvement in previously described bilateral airspace opacities. Probable small bilateral pleural effusions. No pneumothorax. IMPRESSION: Slight interval improvement bilateral airspace opacities may represent improving edema. Underlying infection not excluded. Electronically Signed   By: Lovey Newcomer M.D.   On: 12/25/2018 09:56   Dg Chest Port 1 View  Result Date: 12/23/2018 CLINICAL DATA:  Lethargy. Leukocytosis. Current history of hypertension, end-stage renal disease, CHF and atrial fibrillation. EXAM: PORTABLE CHEST 1 VIEW COMPARISON:  11/09/2018 and earlier. FINDINGS: Cardiac silhouette moderately enlarged, unchanged. Thoracic aorta atherosclerotic, unchanged. Airspace opacities throughout both lungs, asymmetric and increased in the RIGHT lung, new since the most recent prior examination 11/09/2018. IMPRESSION: Asymmetric airspace pulmonary edema versus multifocal BILATERAL pneumonia, RIGHT greater than LEFT. Electronically Signed   By: Evangeline Dakin M.D.   On: 12/23/2018 12:00   Vas [place] Burnard Bunting With/wo Tbi  Result Date: 12/13/2018 LOWER EXTREMITY DOPPLER STUDY Indications: Peripheral artery disease, and Bilateral foot bandage. High Risk Factors: Hypertension, Diabetes, past history of smoking.  Vascular Interventions: Atherectomy left superficial femoral-popliteal artery,                          angioplasty left posterior tibial artery 11/15/2018. Performing Technologist: Alvia Grove RVT  Examination Guidelines: A complete evaluation includes at minimum, Doppler waveform signals and systolic blood pressure reading at the level of bilateral brachial, anterior tibial, and posterior tibial arteries, when vessel segments are accessible. Bilateral testing is considered an integral part of a complete examination. Photoelectric Plethysmograph (PPG) waveforms and toe systolic pressure readings are included as required and additional duplex testing as needed. Limited examinations for reoccurring indications may be performed as noted.  ABI Findings: +--------+------------------+-----+----------+--------+ Right   Rt Pressure (mmHg)IndexWaveform  Comment  +--------+------------------+-----+----------+--------+ [id number]                                        +--------+------------------+-----+----------+--------+  PTA     98                1.01 monophasic         +--------+------------------+-----+----------+--------+ DP      99                1.02 monophasic         +--------+------------------+-----+----------+--------+ +--------+------------------+-----+----------+-------+ Left    Lt Pressure (mmHg)IndexWaveform  Comment +--------+------------------+-----+----------+-------+ Brachial                                 fistula +--------+------------------+-----+----------+-------+ PTA     156               1.61 monophasic        +--------+------------------+-----+----------+-------+ DP      93                0.96 monophasic        +--------+------------------+-----+----------+-------+ +-------+-----------+-----------+------------+------------+ ABI/TBIToday's ABIToday's TBIPrevious ABIPrevious TBI +-------+-----------+-----------+------------+------------+ Right  1.02                  0.63                      +-------+-----------+-----------+------------+------------+ Left   1.61                  0.58                     +-------+-----------+-----------+------------+------------+  Summary: Right: Resting right ankle-brachial index indicates noncompressible right lower extremity arteries. Left: Resting left ankle-brachial index indicates noncompressible left lower extremity arteries. Unable to obtain toe-brachial indeces due to bandages and movement. Technically difficult exam, exam performed in wheel chair.   *See table(s) above for measurements and observations.   Electronically signed by Monica Martinez MD on 12/13/2018 at 9:57:54 AM.    Final     Microbiology: Recent Results (from the past 240 hour(s))  Culture, blood (routine x 2)     Status: None (Preliminary result)   Collection Time: 01/02/19  7:25 PM  Result Value Ref Range Status   Specimen Description BLOOD RIGHT ARM  Final   Special Requests   Final    BOTTLES DRAWN AEROBIC AND ANAEROBIC Blood Culture results may not be optimal due to an excessive volume of blood received in culture bottles   Culture   Final    NO GROWTH < 12 HOURS Performed at [facility], [address], [place], [place] [address]    Report Status PENDING  Incomplete  Culture, blood (routine x 2)     Status: None (Preliminary result)   Collection Time: 01/02/19  7:28 PM  Result Value Ref Range Status   Specimen Description BLOOD RIGHT HAND  Final   Special Requests   Final    BOTTLES DRAWN AEROBIC ONLY Blood Culture adequate volume   Culture   Final    NO GROWTH < 12 HOURS Performed at [facility], [place] [address], [place], [place] [address]    Report Status PENDING  Incomplete     Labs: Basic Metabolic Panel: Recent Labs  Lab 12/28/18 0253 12/29/18 0209 12/30/18 0322 12/31/18 0252 01/02/19 1210  NA 137 140 139 140 137  K 4.4 3.7 4.3 3.5 3.9  CL 99 101 102 101 98  CO2 25 29 23 27 22   GLUCOSE 138* 139* 173* 147* 175*  BUN  56* 30*  48* 17 64*  CREATININE 5.00* 3.52* 5.38* 3.25* 8.36*  CALCIUM 9.1 8.6* 8.3* 8.2* 7.8*  PHOS 2.4* 2.3* 4.8* 3.8 6.2*   Liver Function Tests: Recent Labs  Lab 12/28/18 0253 12/29/18 0209 12/30/18 0322 12/31/18 0252 01/02/19 1210  ALBUMIN 2.4* 2.4* 2.2* 2.1* 2.0*   No results for input(s): LIPASE, AMYLASE in the last 168 hours. No results for input(s): AMMONIA in the last 168 hours. CBC: Recent Labs  Lab 12/28/18 0253 12/29/18 0209 12/30/18 0322 12/31/18 0252 01/02/19 1210  WBC 15.6* 14.8* 25.7* 15.0* 13.4*  HGB 10.2* 10.0* 8.2* 8.0* 7.9*  HCT 32.6* 32.7* 26.0* 26.0* 26.7*  MCV 98.5 97.3 97.7 97.0 98.5  PLT 175 159 171 149* 238   Cardiac Enzymes: No results for input(s): CKTOTAL, CKMB, CKMBINDEX, TROPONINI in the last 168 hours. BNP: BNP (last 3 results) Recent Labs    11/06/18 1905  BNP 1,611.6*    ProBNP (last 3 results) No results for input(s): PROBNP in the last 8760 hours.  CBG: Recent Labs  Lab 12/29/18 1556 12/30/18 0930  GLUCAP 108* 149*       Signed:  Aline August MD.  [facility] 01/03/2019, 8:23 AM

## 2019-01-03 NOTE — Progress Notes (Signed)
Orthopedic Tech Progress Note Patient Details:  LAURIE LOVEJOY 1941-09-16 [id number]  Patient ID: Gertie Exon, male   DOB: [birth year], 78 y.o.   MRN: [id number]   [name] 01/03/2019, 8:58 AMCalled [employer] for bilateral stump shrinkers.

## 2019-01-04 ENCOUNTER — Encounter: Payer: Self-pay | Admitting: Internal Medicine

## 2019-01-04 ENCOUNTER — Non-Acute Institutional Stay (SKILLED_NURSING_FACILITY): Payer: Medicare Other | Admitting: Internal Medicine

## 2019-01-04 DIAGNOSIS — E1351 Other specified diabetes mellitus with diabetic peripheral angiopathy without gangrene: Secondary | ICD-10-CM | POA: Diagnosis not present

## 2019-01-04 DIAGNOSIS — A419 Sepsis, unspecified organism: Secondary | ICD-10-CM | POA: Diagnosis not present

## 2019-01-04 DIAGNOSIS — Z992 Dependence on renal dialysis: Secondary | ICD-10-CM

## 2019-01-04 DIAGNOSIS — H1032 Unspecified acute conjunctivitis, left eye: Secondary | ICD-10-CM

## 2019-01-04 DIAGNOSIS — E1152 Type 2 diabetes mellitus with diabetic peripheral angiopathy with gangrene: Secondary | ICD-10-CM | POA: Diagnosis not present

## 2019-01-04 DIAGNOSIS — I480 Paroxysmal atrial fibrillation: Secondary | ICD-10-CM

## 2019-01-04 DIAGNOSIS — K219 Gastro-esophageal reflux disease without esophagitis: Secondary | ICD-10-CM

## 2019-01-04 DIAGNOSIS — I953 Hypotension of hemodialysis: Secondary | ICD-10-CM

## 2019-01-04 DIAGNOSIS — N186 End stage renal disease: Secondary | ICD-10-CM | POA: Diagnosis not present

## 2019-01-04 DIAGNOSIS — H109 Unspecified conjunctivitis: Secondary | ICD-10-CM | POA: Insufficient documentation

## 2019-01-04 DIAGNOSIS — G4733 Obstructive sleep apnea (adult) (pediatric): Secondary | ICD-10-CM

## 2019-01-04 NOTE — Progress Notes (Signed)
:  Location:  [place] Room Number: 110P Place of Service:  SNF (31)  [name] D. [name], MD  Patient Care Team: Hennie Duos, MD as PCP - General (Internal Medicine) [name], MD as Consulting Physician (Nephrology) Rehab, [place] ([place], [facility]  Extended Emergency Contact Information Primary Emergency Contact: [name],[name] Address: [place], [place] [address] [name] of [place] Phone: [phone] Work Phone: [phone] Mobile Phone: [phone] Relation: Daughter     Allergies: Occlusive silicone sheets [silicone]; Other; and Tape  Chief Complaint  Patient presents with  . New Admit To SNF    Admit to [facility]    HPI: Patient is 78 y.o. male with dementia, end-stage renal disease on hemodialysis, diabetes mellitus, OSA on CPAP, hypertension, hyperlipidemia, CHF, PVD with left m fifth ray amputation on 1210 who presented to [facility] emergency department for fevers, weakness and increased drainage from left fifth toe amputation site.  The staff spoke with vascular surgery who recommended the patient be sent to the emergency department.Patient was admitted to [facility] from 12/27-1/7 patient was started on broad-spectrum antibiotics, blood cultures were negative and vascular surgery was consulted and patient underwent bilateral above-the-knee amputation on 12/29/2018 and antibiotics were discontinued on 1/3.  Hospital course was complicated by acute encephalopathy due to infection, and by volume overload that was resolved with back-to-back dialysis.  Incidentally patient also had conjunctivitis of the left eye that was treated while patient was in the hospital.  Patient is admitted back to skilled nursing facility for OT/PT.  While at skilled nursing facility patient will be followed for paroxysmal atrial fib treated with metoprolol and  prophylaxed with warfarin, obstructive sleep apnea and treated with CPAP and and hypertension on dialysis days treated with midodrine.  Past Medical History:  Diagnosis Date  . A-fib ([place])   . Anemia   . Blood transfusion   . BPH (benign prostatic hyperplasia)   . CHF (congestive heart failure) ([place])   . Diarrhea   . DM (diabetes mellitus) ([place])   . ESRD on hemodialysis ([place])    Started dialysis in 2009  . History of GI bleed    secondary to coumadin  . HTN (hypertension)   . Hyperlipidemia   . OSA (obstructive sleep apnea)    uses CPAP  . Secondary hyperparathyroidism of renal origin [facility])     Past Surgical History:  Procedure Laterality Date  . ABDOMINAL AORTOGRAM N/A 11/15/2018   Procedure: ABDOMINAL AORTOGRAM;  Surgeon: [name], MD;  Location: [place] CV LAB;  Service: Cardiovascular;  Laterality: N/A;  . AMPUTATION Left 12/06/2018   Procedure: AMPUTATION DIGIT LEFT FIFTH TOE;  Surgeon: Angelia Mould, MD;  Location: [place];  Service: Vascular;  Laterality: Left;  . AMPUTATION Bilateral 12/29/2018   Procedure: AMPUTATION ABOVE KNEE;  Surgeon: Waynetta Sandy, MD;  Location: [place];  Service: Vascular;  Laterality: Bilateral;  . BVT  9/62/95   Left  Basilic Vein Transposition  . CHOLECYSTECTOMY    . EYE SURGERY     Catarct bil  . INSERTION OF DIALYSIS CATHETER  05/28/2012   Procedure: INSERTION OF DIALYSIS CATHETER;  Surgeon: Mal Misty, MD;  Location: [place];  Service: Vascular;  Laterality: Right;  . Left arm shuntogram.    . Left forearm loop graft with 6 mm Gore-Tex graft.    Marland Kitchen  LOWER EXTREMITY ANGIOGRAPHY Bilateral 11/15/2018   Procedure: LOWER EXTREMITY ANGIOGRAPHY;  Surgeon: [name], MD;  Location: [place] CV LAB;  Service: Cardiovascular;  Laterality: Bilateral;  . Pars plana vitrectomy with 25-gauge system    . PERIPHERAL VASCULAR ATHERECTOMY Left 11/15/2018   Procedure: PERIPHERAL VASCULAR ATHERECTOMY;  Surgeon: [name], MD;  Location: [place] CV LAB;  Service: Cardiovascular;  Laterality: Left;  SFA with STENT  . PERIPHERAL VASCULAR BALLOON ANGIOPLASTY Left 11/15/2018   Procedure: PERIPHERAL VASCULAR BALLOON ANGIOPLASTY;  Surgeon: [name], MD;  Location: [place] CV LAB;  Service: Cardiovascular;  Laterality: Left;  PT    Allergies as of 01/04/2019      Reactions   Occlusive Silicone Sheets [silicone]    Other    Occlusive adhesive   Tape Itching, Other (See Comments)   Cloth tape only      Medication List       Accurate as of January 04, 2019  3:11 PM. Always use your most recent med list.        acetaminophen 500 MG tablet Commonly known as:  TYLENOL Take 1,000 mg by mouth 3 (three) times daily.   atorvastatin 20 MG tablet Commonly known as:  LIPITOR Take 1 tablet (20 mg total) by mouth daily at 6 PM.   calcium acetate 667 MG capsule Commonly known as:  PHOSLO Take 4 capsules (2,668 mg total) by mouth 3 (three) times daily with meals.   doxercalciferol 4 MCG/2ML injection Commonly known as:  HECTOROL Inject 1 mL (2 mcg total) into the vein every Monday, Wednesday, and Friday with hemodialysis.   feeding supplement (PRO-STAT SUGAR FREE 64) Liqd Take 30 mLs by mouth 2 (two) times daily.   ferrous gluconate 240 (27 FE) MG tablet Commonly known as:  FERGON Take 1 tablet (240 mg total) by mouth 3 (three) times daily with meals.   gentamicin 0.3 % ophthalmic solution Commonly known as:  GARAMYCIN Place 2 drops into both eyes 3 (three) times daily. For conjuctivitis   loperamide 2 MG tablet Commonly known as:  IMODIUM A-D Take 4 mg by mouth See admin instructions. Give 4 mg after 1st loose stool as needed for diarrhea   metoprolol tartrate 25 MG tablet Commonly known as:  LOPRESSOR Take 25 mg by mouth 2 (two) times daily. Do not give before dialysis Mon, Wed, and Fri   midodrine 10 MG tablet Commonly known as:  PROAMATINE Take 10 mg by mouth every Monday,  Wednesday, and Friday. 30 minutes prior to dialysis treatment   MIRCERA 200 MCG/0.3ML Sosy Generic drug:  Methoxy PEG-Epoetin Beta Inject 200 mcg as directed every Monday, Wednesday, and Friday.   multivitamin with minerals Tabs tablet Take 1 tablet by mouth daily.   OVER THE COUNTER MEDICATION at bedtime. BIPAP   pantoprazole 40 MG tablet Commonly known as:  PROTONIX Take 1 tablet (40 mg total) by mouth daily.   warfarin 5 MG tablet Commonly known as:  COUMADIN Take 1 tablet (5 mg total) by mouth daily at 6 PM.       No orders of the defined types were placed in this encounter.    There is no immunization history on file for this patient.  Social History   Tobacco Use  . Smoking status: Former Smoker    Types: Cigarettes    Last attempt to quit: 06/30/2004    Years since quitting: 14.5  . Smokeless tobacco: Never Used  Substance Use Topics  .  Alcohol use: No    Family history is   Family History  Problem Relation Age of Onset  . Alzheimer's disease Mother   . Diabetes Father        Amputation:  bilateral legs  . Cancer Daughter        breast cancer  . Diabetes Son   . Heart disease Son        before age 74  . Hypertension Son   . Anesthesia problems Neg Hx       Review of Systems  DATA OBTAINED: from nursing GENERAL:  no fevers, fatigue, appetite changes SKIN: No itching, or rash EYES: No eye pain, redness, discharge EARS: No earache, tinnitus, change in hearing NOSE: No congestion, drainage or bleeding  MOUTH/THROAT: No mouth or tooth pain, No sore throat RESPIRATORY: No cough, wheezing, SOB CARDIAC: No chest pain, palpitations, lower extremity edema  GI: No abdominal pain, No N/V/D or constipation, No heartburn or reflux  GU: No dysuria, frequency or urgency, or incontinence  MUSCULOSKELETAL: No unrelieved bone/joint pain NEUROLOGIC: No headache, dizziness or focal weakness PSYCHIATRIC: No c/o anxiety or sadness   Vitals:   01/04/19 1510    BP: (!) 100/55  Pulse: 97  Resp: 18  Temp: (!) 97.5 F (36.4 C)    SpO2 Readings from Last 1 Encounters:  01/03/19 92%   Body mass index is 19.85 kg/m.     Physical Exam  GENERAL APPEARANCE: Alert, conversant,  No acute distress.  SKIN: No diaphoresis rash HEAD: Normocephalic, atraumatic  EYES: Conjunctiva/lids clear. Pupils round, reactive. EOMs intact.  EARS: External exam WNL, canals clear. Hearing grossly normal.  NOSE: No deformity or discharge.  MOUTH/THROAT: Lips w/o lesions  RESPIRATORY: Breathing is even, unlabored. Lung sounds are clear   CARDIOVASCULAR: Heart RRR no murmurs, rubs or gallops. No peripheral edema.   GASTROINTESTINAL: Abdomen is soft, non-tender, not distended w/ normal bowel sounds. GENITOURINARY: Bladder non tender, not distended  MUSCULOSKELETAL: Bilateral AKA; wounds both look clean NEUROLOGIC:  Cranial nerves 2-12 grossly intact. 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[place]) 12/30/2018  . Open wound of left foot   . Palliative care encounter   . OSA (obstructive sleep apnea)   . HTN (hypertension)   . ESRD on hemodialysis ([place])   . BPH (benign prostatic hyperplasia)   . Hypotension 12/07/2018  . GERD (gastroesophageal reflux disease) 12/07/2018  . Pressure injury of skin 12/03/2018  . Symptomatic anemia 12/02/2018  . Acute on chronic blood loss anemia 12/02/2018  . 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[place]) 12/02/2018  . Lower GI bleeding 12/02/2018  . Heme positive stool   . Acute on chronic diastolic (congestive) heart failure ([place]) 11/19/2018  . Acute CVA (cerebrovascular accident) ([place]) 11/19/2018  . Hypoglycemia 11/19/2018  . Peripheral vascular disease due to secondary diabetes ([place]) 11/19/2018  . Dry gangrene ([place]) 11/19/2018  . 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  DNR (do not resuscitate) discussion   . Goals of care, counseling/discussion   . Palliative care by specialist   . Acute hypoxemic respiratory failure ([place]) 11/06/2018  . Volume overload 11/06/2018  . Multifocal pneumonia 11/06/2018  . Acute metabolic encephalopathy 46/27/0350  . Physical deconditioning 11/06/2018  . Acute respiratory failure with hypoxia and hypercapnia ([place]) 11/04/2018  . CAP (community acquired pneumonia) 11/04/2018  . Pleural effusion, right 11/04/2018  .  Increased ammonia level 11/04/2018  . Abnormal CT of the abdomen 11/04/2018  . Atrial fibrillation with RVR ([place]) 11/04/2018  . Sepsis ([place]) 10/21/2018  . Acute encephalopathy 10/21/2018  . Elevated alkaline phosphatase level 10/21/2018  . Chronic anemia 10/21/2018  . Thrombocytopenia ([place]) 10/21/2018  . Pulmonary HTN ([place]) 03/04/2017  . Aortic valve stenosis 03/04/2017  . Hypertension, accelerated with heart disease, without CHF 01/12/2017  . Dyspnea on exertion 01/12/2017  . Hyperlipidemia 04/24/2011  . HYPERTENSION, BENIGN 10/24/2009  . DM (diabetes mellitus) ([place]) 10/23/2009  . OBSTRUCTIVE SLEEP APNEA 10/23/2009  . PAF (paroxysmal atrial fibrillation) ([place]) 10/23/2009  . CHF (congestive heart failure) ([place]) 10/23/2009      Labs reviewed: Basic Metabolic Panel:    Component Value Date/Time   NA 137 01/02/2019 1210   NA 144 12/22/2018   K 3.9 01/02/2019 1210   CL 98 01/02/2019 1210   CO2 22 01/02/2019 1210   GLUCOSE 175 (H) 01/02/2019 1210   BUN 64 (H) 01/02/2019 1210   BUN 65 (A) 12/22/2018   CREATININE 8.36 (H) 01/02/2019 1210   CALCIUM 7.8 (L) 01/02/2019 1210   CALCIUM 8.3 (L) 10/25/2008 1450   PROT 7.0 12/23/2018 1128   ALBUMIN 2.0 (L) 01/02/2019 1210   AST 26 12/23/2018 1128   ALT 20 12/23/2018 1128   ALKPHOS 290 (H) 12/23/2018 1128   BILITOT 0.9 12/23/2018 1128   GFRNONAA 6 (L) 01/02/2019 1210   GFRAA 6 (L) 01/02/2019 1210    Recent Labs    11/08/18 0016 11/09/18 0230   11/14/18 0241  12/30/18 0322 12/31/18 0252 01/02/19 1210  NA 138 137   < > 135   < > 139 140 137  K 3.8 4.1   < > 5.0   < > 4.3 3.5 3.9  CL 100 100   < > 97*   < > 102 101 98  CO2 26 27   < > 25   < > 23 27 22   GLUCOSE 105* 94   < > 139*   < > 173* 147* 175*  BUN 15 39*   < > 72*   < > 48* 17 64*  CREATININE 3.19* 5.34*   < > 7.61*   < > 5.38* 3.25* 8.36*  CALCIUM 8.8* 9.1   < > 9.8   < > 8.3* 8.2* 7.8*  MG 2.0 2.2  --  2.4  --   --   --   --   PHOS  --   --    < > 2.4*   < > 4.8* 3.8 6.2*   < > = values in this interval not displayed.   Liver Function Tests: Recent Labs    11/09/18 0230  12/01/18 2353  12/23/18 1128  12/30/18 0322 12/31/18 0252 01/02/19 1210  AST 20  --  195*  --  26  --   --   --   --   ALT 26  --  231*  --  20  --   --   --   --   ALKPHOS 197*  --  293*  --  290*  --   --   --   --   BILITOT 0.8  --  1.1  --  0.9  --   --   --   --   PROT 6.9  --  6.6  --  7.0  --   --   --   --   ALBUMIN 2.8*   < >  2.9*   < > 2.4*   < > 2.2* 2.1* 2.0*   < > = values in this interval not displayed.   Recent Labs    10/21/18 0520 10/24/18 0614  LIPASE 35 32   Recent Labs    10/26/18 0538 11/06/18 1850 11/09/18 0230  AMMONIA 42* 40* 42*   CBC: Recent Labs    12/04/18 0518  12/23/18 1128 12/24/18 0227  12/30/18 0322 12/31/18 0252 01/02/19 1210  WBC 10.4   < > 17.9* 15.6*   < > 25.7* 15.0* 13.4*  NEUTROABS 7.8*  --  15.5* 13.0*  --   --   --   --   HGB 9.5*   < > 9.5* 10.5*   < > 8.2* 8.0* 7.9*  HCT 30.6*   < > 31.7* 33.5*   < > 26.0* 26.0* 26.7*  MCV 106.3*   < > 100.6* 99.4   < > 97.7 97.0 98.5  PLT 87*   < > 223 204   < > 171 149* 238   < > = values in this interval not displayed.   Lipid Recent Labs    11/11/18 1517  CHOL 113  HDL 39*  LDLCALC 62  TRIG 60    Cardiac Enzymes: Recent Labs    11/06/18 1855  TROPONINI <0.03   BNP: Recent Labs    11/06/18 1905  BNP 1,611.6*   No results found for: [facility] Lab Results  Component  Value Date   HGBA1C 4.2 (L) 11/11/2018   Lab Results  Component Value Date   TSH 1.956 11/09/2018   Lab Results  Component Value Date   [id number] 573 11/11/2018   Lab Results  Component Value Date   FOLATE  10/17/2008    >20.0 (NOTE)  Reference Ranges        Deficient:       0.4 - 3.3 ng/mL        Indeterminate:   3.4 - 5.4 ng/mL        Normal:              > 5.4 ng/mL   Lab Results  Component Value Date   IRON 48 06/05/2011   TIBC 194 (L) 06/05/2011   FERRITIN 872 (H) 06/05/2011    Imaging and Procedures obtained prior to SNF admission: Dg Chest Port 1 View  Result Date: 12/23/2018 CLINICAL DATA:  Lethargy. Leukocytosis. Current history of hypertension, end-stage renal disease, CHF and atrial fibrillation. EXAM: PORTABLE CHEST 1 VIEW COMPARISON:  11/09/2018 and earlier. FINDINGS: Cardiac silhouette moderately enlarged, unchanged. Thoracic aorta atherosclerotic, unchanged. Airspace opacities throughout both lungs, asymmetric and increased in the RIGHT lung, new since the most recent prior examination 11/09/2018. IMPRESSION: Asymmetric airspace pulmonary edema versus multifocal BILATERAL pneumonia, RIGHT greater than LEFT. Electronically Signed   By: Evangeline Dakin M.D.   On: 12/23/2018 12:00     Not all labs, radiology exams or other studies done during hospitalization come through on my EPIC note; however they are reviewed by me.    Assessment and   Sepsis/PVD/bilateral lower extremity gangrene with infection/status post AKA bilateral- patient admitted with fever, leukocytosis, wound with drainage and foul-smelling discharge, history of left fem-PT bypass graft 11/15/2018 and left fifth ray amputation 12/06/2018.  Patient was started on broad-spectrum antibiotics, blood cultures were negative; vascular surgery was consulted and patient underwent a bilateral AKA on 12/29/2018 SNF- admitted for OT/PT  ESRD on dialysis-patient was volume overloaded on admission and had  back-to-back dialysis x2  with improvement SNF- continue hemodialysis Monday Wednesday Friday  Hypotension SNF continue midodrine 10 mg prior to dialysis  Paroxysmal atrial fibrillation SNF- stable continue metoprolol 25 mg twice daily except for do not give on the morning of dialysis days and prophylaxed with warfarin  Obstructive sleep apnea SNF- continue CPAP  Conjunctivitis left eye- treated with erythromycin ointment and resolved during hospital stay  GERD SNF- Protonix 40 mg daily, this is a change from prior of twice daily   ;> 50% of time with patient was spent reviewing records, labs, tests and studies, counseling and developing plan of care  Webb Silversmith D. [name], MD

## 2019-01-05 ENCOUNTER — Encounter: Payer: Self-pay | Admitting: Internal Medicine

## 2019-01-07 LAB — CULTURE, BLOOD (ROUTINE X 2)
Culture: NO GROWTH
Culture: NO GROWTH
Special Requests: ADEQUATE

## 2019-01-09 LAB — CBC AND DIFFERENTIAL
HCT: 25 — AB (ref 41–53)
Hemoglobin: 8.1 — AB (ref 13.5–17.5)
Platelets: 185 (ref 150–399)
WBC: 8.2

## 2019-01-11 ENCOUNTER — Encounter: Payer: Self-pay | Admitting: Neurology

## 2019-01-11 ENCOUNTER — Ambulatory Visit (INDEPENDENT_AMBULATORY_CARE_PROVIDER_SITE_OTHER): Payer: Medicare Other | Admitting: Neurology

## 2019-01-11 VITALS — BP 115/65 | HR 90

## 2019-01-11 DIAGNOSIS — I4811 Longstanding persistent atrial fibrillation: Secondary | ICD-10-CM

## 2019-01-11 DIAGNOSIS — I6381 Other cerebral infarction due to occlusion or stenosis of small artery: Secondary | ICD-10-CM

## 2019-01-11 NOTE — Patient Instructions (Signed)
I had a long d/w patient about his recent stroke,atrial fibrillation, risk for recurrent stroke/TIAs, personally independently reviewed imaging studies and stroke evaluation results and answered questions.Continue warfarin daily  for secondary stroke prevention and maintain strict control of hypertension with blood pressure goal below 130/90, diabetes with hemoglobin A1c goal below 6.5% and lipids with LDL cholesterol goal below 70 mg/dL. I also advised the patient to eat a healthy diet with plenty of whole grains, cereals, fruits and vegetables, exercise regularly and maintain ideal body weight .  No routine scheduled follow-up appointment is necessary but he may return for follow-up as needed only.  Stroke Prevention Some medical conditions and behaviors are associated with a higher chance of having a stroke. You can help prevent a stroke by making nutrition, lifestyle, and other changes, including managing any medical conditions you may have. What nutrition changes can be made?   Eat healthy foods. You can do this by: ? Choosing foods high in fiber, such as fresh fruits and vegetables and whole grains. ? Eating at least 5 or more servings of fruits and vegetables a day. Try to fill half of your plate at each meal with fruits and vegetables. ? Choosing lean protein foods, such as lean cuts of meat, poultry without skin, fish, tofu, beans, and nuts. ? Eating low-fat dairy products. ? Avoiding foods that are high in salt (sodium). This can help lower blood pressure. ? Avoiding foods that have saturated fat, trans fat, and cholesterol. This can help prevent high cholesterol. ? Avoiding processed and premade foods.  Follow your health care provider's specific guidelines for losing weight, controlling high blood pressure (hypertension), lowering high cholesterol, and managing diabetes. These may include: ? Reducing your daily calorie intake. ? Limiting your daily sodium intake to 1,500 milligrams  (mg). ? Using only healthy fats for cooking, such as olive oil, canola oil, or sunflower oil. ? Counting your daily carbohydrate intake. What lifestyle changes can be made?  Maintain a healthy weight. Talk to your health care provider about your ideal weight.  Get at least 30 minutes of moderate physical activity at least 5 days a week. Moderate activity includes brisk walking, biking, and swimming.  Do not use any products that contain nicotine or tobacco, such as cigarettes and e-cigarettes. If you need help quitting, ask your health care provider. It may also be helpful to avoid exposure to secondhand smoke.  Limit alcohol intake to no more than 1 drink a day for nonpregnant women and 2 drinks a day for men. One drink equals 12 oz of beer, 5 oz of wine, or 1 oz of hard liquor.  Stop any illegal drug use.  Avoid taking birth control pills. Talk to your health care provider about the risks of taking birth control pills if: ? You are over 53 years old. ? You smoke. ? You get migraines. ? You have ever had a blood clot. What other changes can be made?  Manage your cholesterol levels. ? Eating a healthy diet is important for preventing high cholesterol. If cholesterol cannot be managed through diet alone, you may also need to take medicines. ? Take any prescribed medicines to control your cholesterol as told by your health care provider.  Manage your diabetes. ? Eating a healthy diet and exercising regularly are important parts of managing your blood sugar. If your blood sugar cannot be managed through diet and exercise, you may need to take medicines. ? Take any prescribed medicines to control your diabetes as  told by your health care provider.  Control your hypertension. ? To reduce your risk of stroke, try to keep your blood pressure below 130/80. ? Eating a healthy diet and exercising regularly are an important part of controlling your blood pressure. If your blood pressure cannot  be managed through diet and exercise, you may need to take medicines. ? Take any prescribed medicines to control hypertension as told by your health care provider. ? Ask your health care provider if you should monitor your blood pressure at home. ? Have your blood pressure checked every year, even if your blood pressure is normal. Blood pressure increases with age and some medical conditions.  Get evaluated for sleep disorders (sleep apnea). Talk to your health care provider about getting a sleep evaluation if you snore a lot or have excessive sleepiness.  Take over-the-counter and prescription medicines only as told by your health care provider. Aspirin or blood thinners (antiplatelets or anticoagulants) may be recommended to reduce your risk of forming blood clots that can lead to stroke.  Make sure that any other medical conditions you have, such as atrial fibrillation or atherosclerosis, are managed. What are the warning signs of a stroke? The warning signs of a stroke can be easily remembered as BEFAST.  B is for balance. Signs include: ? Dizziness. ? Loss of balance or coordination. ? Sudden trouble walking.  E is for eyes. Signs include: ? A sudden change in vision. ? Trouble seeing.  F is for face. Signs include: ? Sudden weakness or numbness of the face. ? The face or eyelid drooping to one side.  A is for arms. Signs include: ? Sudden weakness or numbness of the arm, usually on one side of the body.  S is for speech. Signs include: ? Trouble speaking (aphasia). ? Trouble understanding.  T is for time. ? These symptoms may represent a serious problem that is an emergency. Do not wait to see if the symptoms will go away. Get medical help right away. Call your local emergency services (911 in the [place] Do not drive yourself to the hospital.  Other signs of stroke may include: ? A sudden, severe headache with no known cause. ? Nausea or vomiting. ? Seizure. Where to  find more information For more information, visit:  American Stroke Association: www.strokeassociation.org  National Stroke Association: www.stroke.org Summary  You can prevent a stroke by eating healthy, exercising, not smoking, limiting alcohol intake, and managing any medical conditions you may have.  Do not use any products that contain nicotine or tobacco, such as cigarettes and e-cigarettes. If you need help quitting, ask your health care provider. It may also be helpful to avoid exposure to secondhand smoke.  Remember BEFAST for warning signs of stroke. Get help right away if you or a loved one has any of these signs. This information is not intended to replace advice given to you by your health care provider. Make sure you discuss any questions you have with your health care provider. Document Released: 01/21/2005 Document Revised: 01/19/2017 Document Reviewed: 01/19/2017 Elsevier Interactive Patient Education  2019 [employer].

## 2019-01-11 NOTE — Progress Notes (Signed)
[facility] [address] [place]. [place] [address] [phone]       OFFICE CONSULT NOTE  [name] Date of Birth:  1941-11-12 Medical Record Number:  [id number]   Referring MD:  Rosalin Hawking Reason for Referral: Stroke  HPI: [name] is a 78 year old African-American male seen today for initial office consultation visit for his recent stroke.  History is obtained from the patient, review of electronic medical records and I personally reviewed imaging films and recent hospital stroke work-up.[name] is an 78 y.o. male PMH CHF, ESRD ( on dialysis MWF), DM, HTN, HLD,a fib ( not anticoagulated), OSA presented to [facility] on 11/06/18 for SOB and near syncope.  Patient had MRI obtained because of altered mental status which showed a small right coronary radiata infarct.  His only neurological complaint was dysarthria.  Patient was not on anticoagulation due to history of lower GI bleed in 2012 which required 4 units of blood transfusion and 1 unit of FFP.  However subsequent colonoscopy had not shown any source for recurrent bleeding and this patient was started during the present admission for stroke on anticoagulation with warfarin.  MRI of the brain was motion degraded but showed no large vessel occlusion.  Carotid ultrasound showed no significant extracranial stenosis.  2D echo showed normal ejection fraction.  LDL cholesterol was normal at 62 and hemoglobin A1c at 4.2.  Patient was counseled to quit smoking.  He states is done well since discharge.  He sweats speech is improved.  He is tolerating warfarin without significant bleeding but only minor bruising.  He is wheelchair-bound for the last 3 years and has not been walking following his amputation.  He is currently living in assisted living and needs help with most activities of daily living.  He states his blood pressure is well controlled  ROS:   14 system review of systems is positive for no complaints today  all other systems negative PMH:  Past Medical History:  Diagnosis Date  . A-fib ([place])   . Anemia   . Blood transfusion   . BPH (benign prostatic hyperplasia)   . CHF (congestive heart failure) ([place])   . Diarrhea   . DM (diabetes mellitus) ([place])   . ESRD on hemodialysis ([place])    Started dialysis in 2009  . History of GI bleed    secondary to coumadin  . HTN (hypertension)   . Hyperlipidemia   . OSA (obstructive sleep apnea)    uses CPAP  . Secondary hyperparathyroidism of renal origin [facility])     Social History:  Social History   Socioeconomic History  . Marital status: Divorced    Spouse name: Not on file  . Number of children: Not on file  . Years of education: Not on file  . Highest education level: Not on file  Occupational History  . Not on file  Social Needs  . Financial resource strain: Not on file  . Food insecurity:    Worry: Not on file    Inability: Not on file  . Transportation needs:    Medical: Not on file    Non-medical: Not on file  Tobacco Use  . Smoking status: Former Smoker    Types: Cigarettes    Last attempt to quit: 06/30/2004    Years since quitting: 14.5  . Smokeless tobacco: Never Used  Substance and Sexual Activity  . Alcohol use: No  . Drug use: No  . Sexual activity: Never  Lifestyle  .  Physical activity:    Days per week: Not on file    Minutes per session: Not on file  . Stress: Not on file  Relationships  . Social connections:    Talks on phone: Not on file    Gets together: Not on file    Attends religious service: Not on file    Active member of club or organization: Not on file    Attends meetings of clubs or organizations: Not on file    Relationship status: Not on file  . Intimate partner violence:    Fear of current or ex partner: Not on file    Emotionally abused: Not on file    Physically abused: Not on file    Forced sexual activity: Not on file  Other Topics Concern  . Not on file  Social History Narrative      The patient is a former smoker, quit is 2005.  Does not     drink or abuse drugs.  Currently, lives alone at home.          Medications:   Current Outpatient Medications on File Prior to Visit  Medication Sig Dispense Refill  . acetaminophen (TYLENOL) 500 MG tablet Take 1,000 mg by mouth 3 (three) times daily.    . Amino Acids-Protein Hydrolys (FEEDING SUPPLEMENT, PRO-STAT SUGAR FREE 64,) LIQD Take 30 mLs by mouth 2 (two) times daily.    Marland Kitchen atorvastatin (LIPITOR) 20 MG tablet Take 1 tablet (20 mg total) by mouth daily at 6 PM. 30 tablet 0  . calcium acetate (PHOSLO) 667 MG capsule Take 4 capsules (2,668 mg total) by mouth 3 (three) times daily with meals. 90 capsule 0  . doxercalciferol (HECTOROL) 4 MCG/2ML injection Inject 1 mL (2 mcg total) into the vein every Monday, Wednesday, and Friday with hemodialysis. (Patient taking differently: Inject 1 mcg into the vein every Monday, Wednesday, and Friday with hemodialysis. 0.5 ml) 2 mL   . ferrous gluconate (FERGON) 240 (27 FE) MG tablet Take 1 tablet (240 mg total) by mouth 3 (three) times daily with meals. 90 each 0  . gentamicin (GARAMYCIN) 0.3 % ophthalmic solution Place 2 drops into both eyes 3 (three) times daily. For conjuctivitis    . loperamide (IMODIUM A-D) 2 MG tablet Take 4 mg by mouth See admin instructions. Give 4 mg after 1st loose stool as needed for diarrhea    . Methoxy PEG-Epoetin Beta (MIRCERA) 200 MCG/0.3ML SOSY Inject 200 mcg as directed every Monday, Wednesday, and Friday.    . metoprolol tartrate (LOPRESSOR) 25 MG tablet Take 25 mg by mouth 2 (two) times daily. Do not give before dialysis Mon, Wed, and Fri    . midodrine (PROAMATINE) 10 MG tablet Take 10 mg by mouth every Monday, Wednesday, and Friday. 30 minutes prior to dialysis treatment    . Multiple Vitamin (MULTIVITAMIN WITH MINERALS) TABS tablet Take 1 tablet by mouth daily.    Marland Kitchen OVER THE COUNTER MEDICATION at bedtime. BIPAP    . pantoprazole (PROTONIX) 40 MG tablet  Take 1 tablet (40 mg total) by mouth daily.  0   No current facility-administered medications on file prior to visit.     Allergies:   Allergies  Allergen Reactions  . Occlusive Silicone Sheets [Silicone]   . Other     Occlusive adhesive  . Tape Itching and Other (See Comments)    Cloth tape only    Physical Exam General: well developed, well nourished, seated, in no evident distress Head:  head normocephalic and atraumatic.   Neck: supple with no carotid or supraclavicular bruits Cardiovascular: regular rate and rhythm, no murmurs Musculoskeletal: Bilateral above-knee   amputation Skin:  no rash/petichiae Vascular:  Normal pulses all extremities except left upper extremity where there is dialysis fistula  Neurologic Exam Mental Status: Awake and fully alert. Oriented to place and time. Recent and remote memory intact. Attention span, concentration and fund of knowledge appropriate. Mood and affect appropriate.  No aphasia mild dysarthria. Cranial Nerves: Fundoscopic exam reveals sharp disc margins. Pupils equal, briskly reactive to light. Extraocular movements full without nystagmus. Visual fields full to confrontation. Hearing intact. Facial sensation intact. Face, tongue, palate moves normally and symmetrically.  Motor: Normal bulk and tone. Normal strength in all tested extremity muscles. Sensory.: intact to touch , pinprick , position and vibratory sensation.  Coordination: Rapid alternating movements normal in all extremities. Finger-to-nose and heel-to-shin performed accurately bilaterally. Gait and Station: Unable to test as patient is wheelchair-bound and has bilateral above-knee amputations Reflexes: 1+ and symmetric. 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  Multiple vascular  risk factors of atrial fibrillation, hypertension, hyper lipidemia .     PLAN: I had a long d/w patient about his recent stroke,atrial fibrillation, risk for recurrent stroke/TIAs, personally independently reviewed imaging studies and stroke evaluation results and answered questions.Continue warfarin daily  for secondary stroke prevention and maintain strict control of hypertension with blood pressure goal below 130/90, diabetes with hemoglobin A1c goal below 6.5% and lipids with LDL cholesterol goal below 70 mg/dL. I also advised the patient to eat a healthy diet with plenty of whole grains, cereals, fruits and vegetables, exercise regularly and maintain ideal body weight .  Greater than 50% time during this 45-minute consultation visit was spent on counseling and coordination of care about his stroke, atrial fibrillation discussion about stroke prevention and treatment and answered questions.  No routine scheduled follow-up appointment is necessary but he may return for follow-up as needed only. Note: This document was prepared with digital dictation and possible smart phrase technology. Any transcriptional errors that result from this process are unintentional.  Antony Contras, MD  [facility] [address] [place], [place] [address]  Phone [phone] Fax [phone]

## 2019-01-23 ENCOUNTER — Non-Acute Institutional Stay (SKILLED_NURSING_FACILITY): Payer: Medicare Other | Admitting: Internal Medicine

## 2019-01-23 ENCOUNTER — Encounter: Payer: Self-pay | Admitting: Internal Medicine

## 2019-01-23 DIAGNOSIS — R635 Abnormal weight gain: Secondary | ICD-10-CM | POA: Diagnosis not present

## 2019-01-23 DIAGNOSIS — R609 Edema, unspecified: Secondary | ICD-10-CM

## 2019-01-23 NOTE — Progress Notes (Signed)
:   Location:  [place] Room Number: 110P Place of Service:  SNF (31)  Dwayna Kentner D. [name], MD  Patient Care Team: Hennie Duos, MD as PCP - General (Internal Medicine) [name], MD as Consulting Physician (Nephrology) Rehab, [place] ([place], [facility]  Extended Emergency Contact Information Primary Emergency Contact: [name],[name] Address: [place], [place] [address] [name] [place] Phone: [phone] Work Phone: [phone] Mobile Phone: [phone] Relation: Daughter Secondary Emergency Contact: [name] Mobile Phone: [phone] Relation: Other     Allergies: Occlusive silicone sheets [silicone]; Other; and Tape  Chief Complaint  Patient presents with  . Acute Visit    HPI: Patient is 78 y.o. male who is being seen for weight gain.  Last week patient's weight went from 120-126.9 and patient was started on 20 mg of Lasix.  This week patient weighs 128.8 pounds.  Patient denies chest pain or shortness of breath.  Past Medical History:  Diagnosis Date  . A-fib ([place])   . Anemia   . Blood transfusion   . BPH (benign prostatic hyperplasia)   . CHF (congestive heart failure) ([place])   . Diarrhea   . DM (diabetes mellitus) ([place])   . ESRD on hemodialysis ([place])    Started dialysis in 2009  . History of GI bleed    secondary to coumadin  . HTN (hypertension)   . Hyperlipidemia   . OSA (obstructive sleep apnea)    uses CPAP  . Secondary hyperparathyroidism of renal origin [facility])     Past Surgical History:  Procedure Laterality Date  . ABDOMINAL AORTOGRAM N/A 11/15/2018   Procedure: ABDOMINAL AORTOGRAM;  Surgeon: [name], MD;  Location: [place] CV LAB;  Service: Cardiovascular;  Laterality: N/A;  . AMPUTATION Left 12/06/2018   Procedure: AMPUTATION DIGIT LEFT FIFTH TOE;  Surgeon: Angelia Mould, MD;  Location: [place];   Service: Vascular;  Laterality: Left;  . AMPUTATION Bilateral 12/29/2018   Procedure: AMPUTATION ABOVE KNEE;  Surgeon: Waynetta Sandy, MD;  Location: [place];  Service: Vascular;  Laterality: Bilateral;  . BVT  06/27/15   Left  Basilic Vein Transposition  . CHOLECYSTECTOMY    . EYE SURGERY     Catarct bil  . INSERTION OF DIALYSIS CATHETER  05/28/2012   Procedure: INSERTION OF DIALYSIS CATHETER;  Surgeon: Mal Misty, MD;  Location: [place];  Service: Vascular;  Laterality: Right;  . Left arm shuntogram.    . Left forearm loop graft with 6 mm Gore-Tex graft.    . LOWER EXTREMITY ANGIOGRAPHY Bilateral 11/15/2018   Procedure: LOWER EXTREMITY ANGIOGRAPHY;  Surgeon: [name], MD;  Location: [place] CV LAB;  Service: Cardiovascular;  Laterality: Bilateral;  . Pars plana vitrectomy with 25-gauge system    . PERIPHERAL VASCULAR ATHERECTOMY Left 11/15/2018   Procedure: PERIPHERAL VASCULAR ATHERECTOMY;  Surgeon: [name], MD;  Location: [place] CV LAB;  Service: Cardiovascular;  Laterality: Left;  SFA with STENT  . PERIPHERAL VASCULAR BALLOON ANGIOPLASTY Left 11/15/2018   Procedure: PERIPHERAL VASCULAR BALLOON ANGIOPLASTY;  Surgeon: [name], MD;  Location: [place] CV LAB;  Service: Cardiovascular;  Laterality: Left;  PT    Allergies as of 01/23/2019      Reactions   Occlusive Silicone Sheets [silicone]    Other    Occlusive adhesive   Tape Itching, Other (See  Comments)   Cloth tape only      Medication List       Accurate as of January 23, 2019  4:25 PM. Always use your most recent med list.        acetaminophen 500 MG tablet Commonly known as:  TYLENOL Take 1,000 mg by mouth 3 (three) times daily.   atorvastatin 20 MG tablet Commonly known as:  LIPITOR Take 1 tablet (20 mg total) by mouth daily at 6 PM.   calcium acetate 667 MG capsule Commonly known as:  PHOSLO Take 4 capsules (2,668 mg total) by mouth 3 (three) times daily with  meals.   doxercalciferol 4 MCG/2ML injection Commonly known as:  HECTOROL Inject 1 mL (2 mcg total) into the vein every Monday, Wednesday, and Friday with hemodialysis.   feeding supplement (PRO-STAT SUGAR FREE 64) Liqd Take 30 mLs by mouth 2 (two) times daily.   ferrous gluconate 240 (27 FE) MG tablet Commonly known as:  FERGON Take 1 tablet (240 mg total) by mouth 3 (three) times daily with meals.   gentamicin 0.3 % ophthalmic solution Commonly known as:  GARAMYCIN Place 2 drops into both eyes 3 (three) times daily. For conjuctivitis   loperamide 2 MG tablet Commonly known as:  IMODIUM A-D Take 4 mg by mouth See admin instructions. Give 4 mg after 1st loose stool as needed for diarrhea   metoprolol tartrate 25 MG tablet Commonly known as:  LOPRESSOR Take 25 mg by mouth 2 (two) times daily. Do not give before dialysis Mon, Wed, and Fri   midodrine 10 MG tablet Commonly known as:  PROAMATINE Take 10 mg by mouth every Monday, Wednesday, and Friday. 30 minutes prior to dialysis treatment   MIRCERA 200 MCG/0.3ML Sosy Generic drug:  Methoxy PEG-Epoetin Beta Inject 200 mcg as directed every Monday, Wednesday, and Friday.   multivitamin with minerals Tabs tablet Take 1 tablet by mouth daily.   OVER THE COUNTER MEDICATION at bedtime. BIPAP   pantoprazole 40 MG tablet Commonly known as:  PROTONIX Take 1 tablet (40 mg total) by mouth daily.       No orders of the defined types were placed in this encounter.    There is no immunization history on file for this patient.  Social History   Tobacco Use  . Smoking status: Former Smoker    Types: Cigarettes    Last attempt to quit: 06/30/2004    Years since quitting: 14.5  . Smokeless tobacco: Never Used  Substance Use Topics  . Alcohol use: No    Family history is   Family History  Problem Relation Age of Onset  . Alzheimer's disease Mother   . Diabetes Father        Amputation:  bilateral legs  . Cancer Daughter         breast cancer  . Diabetes Son   . Heart disease Son        before age 65  . Hypertension Son   . Anesthesia problems Neg Hx       Review of Systems  DATA OBTAINED: from nurse GENERAL:  no fevers, fatigue, appetite changes SKIN: No itching, or rash EYES: No eye pain, redness, discharge EARS: No earache, tinnitus, change in hearing NOSE: No congestion, drainage or bleeding  MOUTH/THROAT: No mouth or tooth pain, No sore throat RESPIRATORY: No cough, wheezing, SOB CARDIAC: No chest pain, palpitations, lower extremity edema  GI: No abdominal pain, No N/V/D or constipation, No heartburn or reflux  GU: No dysuria, frequency or urgency, or incontinence  MUSCULOSKELETAL: No unrelieved bone/joint pain NEUROLOGIC: No headache, dizziness or focal weakness PSYCHIATRIC: No c/o anxiety or sadness   Vitals:   01/23/19 1624  BP: 102/78  Pulse: 77  Resp: 18  Temp: (!) 97 F (36.1 C)    SpO2 Readings from Last 1 Encounters:  01/03/19 92%   Body mass index is 20.79 kg/m.     Physical Exam  GENERAL APPEARANCE: Alert, conversant,  No acute distress.  SKIN: No diaphoresis rash HEAD: Normocephalic, atraumatic  EYES: Conjunctiva/lids clear. Pupils round, reactive. EOMs intact.  EARS: External exam WNL, canals clear. Hearing grossly normal.  NOSE: No deformity or discharge.  MOUTH/THROAT: Lips w/o lesions  RESPIRATORY: Breathing is even, unlabored. Lung sounds are clear   CARDIOVASCULAR: Heart RRR no murmurs, rubs or gallops.  Trace peripheral edema.   GASTROINTESTINAL: Abdomen is soft, non-tender, not distended w/ normal bowel sounds. GENITOURINARY: Bladder non tender, not distended  MUSCULOSKELETAL: Bilateral AKA NEUROLOGIC:  Cranial nerves 2-12 grossly intact. 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 Conjunctivitis of left eye 01/04/2019  . S/P AKA (above knee amputation)  bilateral ([place]) 12/30/2018  . Open wound of left foot   . Palliative care encounter   . OSA (obstructive sleep apnea)   . HTN (hypertension)   . ESRD on hemodialysis ([place])   . BPH (benign prostatic hyperplasia)   . Hypotension 12/07/2018  . GERD (gastroesophageal reflux disease) 12/07/2018  . Pressure injury of skin 12/03/2018  . Symptomatic anemia 12/02/2018  . Acute on chronic blood loss anemia 12/02/2018  . 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 Lower GI bleeding 12/02/2018  . Heme positive stool   . Acute on chronic diastolic (congestive) heart failure ([place]) 11/19/2018  . Acute CVA (cerebrovascular accident) ([place]) 11/19/2018  . Hypoglycemia 11/19/2018  . Peripheral vascular disease due to secondary diabetes ([place]) 11/19/2018  . Dry gangrene ([place]) 11/19/2018  . 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 DNR (do not resuscitate) discussion   . Goals of care, counseling/discussion   . Palliative care by specialist   . Acute hypoxemic respiratory failure ([place]) 11/06/2018  . Volume overload 11/06/2018  . Multifocal pneumonia 11/06/2018  . Acute metabolic encephalopathy 81/82/9937  . Physical deconditioning 11/06/2018  . Acute respiratory failure with hypoxia and hypercapnia ([place]) 11/04/2018  . CAP (community acquired pneumonia) 11/04/2018  . Pleural effusion, right 11/04/2018  . Increased ammonia level 11/04/2018  . Abnormal CT of the abdomen 11/04/2018  . Atrial fibrillation with RVR ([place]) 11/04/2018  . Sepsis ([place]) 10/21/2018  . Acute encephalopathy 10/21/2018  . Elevated alkaline phosphatase level 10/21/2018  . Chronic anemia 10/21/2018  . Thrombocytopenia ([place]) 10/21/2018  . Pulmonary HTN ([place]) 03/04/2017  . Aortic valve stenosis 03/04/2017  . Hypertension, accelerated with heart disease, without CHF 01/12/2017  . Dyspnea on exertion 01/12/2017  . Hyperlipidemia 04/24/2011  . HYPERTENSION, BENIGN 10/24/2009  . DM (diabetes mellitus) ([place])  10/23/2009  . OBSTRUCTIVE SLEEP APNEA 10/23/2009  . PAF (paroxysmal atrial fibrillation) ([place]) 10/23/2009  . CHF (congestive heart failure) ([place]) 10/23/2009      Labs reviewed: Basic Metabolic Panel:    Component Value Date/Time   NA 137 01/02/2019 1210   NA 144 12/22/2018   K 3.9 01/02/2019 1210   CL 98 01/02/2019 1210   CO2 22 01/02/2019 1210   GLUCOSE 175 (H)  01/02/2019 1210   BUN 64 (H) 01/02/2019 1210   BUN 65 (A) 12/22/2018   CREATININE 8.36 (H) 01/02/2019 1210   CALCIUM 7.8 (L) 01/02/2019 1210   CALCIUM 8.3 (L) 10/25/2008 1450   PROT 7.0 12/23/2018 1128   ALBUMIN 2.0 (L) 01/02/2019 1210   AST 26 12/23/2018 1128   ALT 20 12/23/2018 1128   ALKPHOS 290 (H) 12/23/2018 1128   BILITOT 0.9 12/23/2018 1128   GFRNONAA 6 (L) 01/02/2019 1210   GFRAA 6 (L) 01/02/2019 1210    Recent Labs    11/08/18 0016 11/09/18 0230  11/14/18 0241  12/30/18 0322 12/31/18 0252 01/02/19 1210  NA 138 137   < > 135   < > 139 140 137  K 3.8 4.1   < > 5.0   < > 4.3 3.5 3.9  CL 100 100   < > 97*   < > 102 101 98  CO2 26 27   < > 25   < > 23 27 22   GLUCOSE 105* 94   < > 139*   < > 173* 147* 175*  BUN 15 39*   < > 72*   < > 48* 17 64*  CREATININE 3.19* 5.34*   < > 7.61*   < > 5.38* 3.25* 8.36*  CALCIUM 8.8* 9.1   < > 9.8   < > 8.3* 8.2* 7.8*  MG 2.0 2.2  --  2.4  --   --   --   --   PHOS  --   --    < > 2.4*   < > 4.8* 3.8 6.2*   < > = values in this interval not displayed.   Liver Function Tests: Recent Labs    11/09/18 0230  12/01/18 2353  12/23/18 1128  12/30/18 0322 12/31/18 0252 01/02/19 1210  AST 20  --  195*  --  26  --   --   --   --   ALT 26  --  231*  --  20  --   --   --   --   ALKPHOS 197*  --  293*  --  290*  --   --   --   --   BILITOT 0.8  --  1.1  --  0.9  --   --   --   --   PROT 6.9  --  6.6  --  7.0  --   --   --   --   ALBUMIN 2.8*   < > 2.9*   < > 2.4*   < > 2.2* 2.1* 2.0*   < > = values in this interval not displayed.   Recent Labs    10/21/18 0520  10/24/18 0614  LIPASE 35 32   Recent Labs    10/26/18 0538 11/06/18 1850 11/09/18 0230  AMMONIA 42* 40* 42*   CBC: Recent Labs    12/04/18 0518  12/23/18 1128 12/24/18 0227  12/30/18 0322 12/31/18 0252 01/02/19 1210 01/09/19  WBC 10.4   < > 17.9* 15.6*   < > 25.7* 15.0* 13.4* 8.2  NEUTROABS 7.8*  --  15.5* 13.0*  --   --   --   --   --   HGB 9.5*   < > 9.5* 10.5*   < > 8.2* 8.0* 7.9* 8.1*  HCT 30.6*   < > 31.7* 33.5*   < > 26.0* 26.0* 26.7* 25*  MCV 106.3*   < > 100.6* 99.4   < >  97.7 97.0 98.5  --   PLT 87*   < > 223 204   < > 171 149* 238 185   < > = values in this interval not displayed.   Lipid Recent Labs    11/11/18 1517  CHOL 113  HDL 39*  LDLCALC 62  TRIG 60    Cardiac Enzymes: Recent Labs    11/06/18 1855  TROPONINI <0.03   BNP: Recent Labs    11/06/18 1905  BNP 1,611.6*   No results found for: [facility] Lab Results  Component Value Date   HGBA1C 4.2 (L) 11/11/2018   Lab Results  Component Value Date   TSH 1.956 11/09/2018   Lab Results  Component Value Date   FEOFHQRF75 883 11/11/2018   Lab Results  Component Value Date   FOLATE  10/17/2008    >20.0 (NOTE)  Reference Ranges        Deficient:       0.4 - 3.3 ng/mL        Indeterminate:   3.4 - 5.4 ng/mL        Normal:              > 5.4 ng/mL   Lab Results  Component Value Date   IRON 48 06/05/2011   TIBC 194 (L) 06/05/2011   FERRITIN 872 (H) 06/05/2011    Imaging and Procedures obtained prior to SNF admission: Dg Chest Port 1 View  Result Date: 12/23/2018 CLINICAL DATA:  Lethargy. Leukocytosis. Current history of hypertension, end-stage renal disease, CHF and atrial fibrillation. EXAM: PORTABLE CHEST 1 VIEW COMPARISON:  11/09/2018 and earlier. FINDINGS: Cardiac silhouette moderately enlarged, unchanged. Thoracic aorta atherosclerotic, unchanged. Airspace opacities throughout both lungs, asymmetric and increased in the RIGHT lung, new since the most recent prior examination  11/09/2018. IMPRESSION: Asymmetric airspace pulmonary edema versus multifocal BILATERAL pneumonia, RIGHT greater than LEFT. Electronically Signed   By: Evangeline Dakin M.D.   On: 12/23/2018 12:00     Not all labs, radiology exams or other studies done during hospitalization come through on my EPIC note; however they are reviewed by me.    Assessment and Plan  Weight gain- patient's Lasix was increased from 20 mg daily to 40 mg daily; will continue to monitor weights; BMP in 7 days    Noah Delaine. [name], MD

## 2019-01-24 ENCOUNTER — Encounter: Payer: Self-pay | Admitting: Internal Medicine

## 2019-01-26 LAB — BASIC METABOLIC PANEL
BUN: 78 — AB (ref 4–21)
Creatinine: 7.3 — AB (ref 0.6–1.3)
Glucose: 110
Potassium: 4.8 (ref 3.4–5.3)
Sodium: 149 — AB (ref 137–147)

## 2019-01-31 LAB — BASIC METABOLIC PANEL
BUN: 21 (ref 4–21)
Creatinine: 3.7 — AB (ref 0.6–1.3)
Glucose: 89
Potassium: 3.5 (ref 3.4–5.3)
Sodium: 146 (ref 137–147)

## 2019-02-03 ENCOUNTER — Other Ambulatory Visit: Payer: Self-pay

## 2019-02-03 ENCOUNTER — Ambulatory Visit (INDEPENDENT_AMBULATORY_CARE_PROVIDER_SITE_OTHER): Payer: Self-pay | Admitting: Vascular Surgery

## 2019-02-03 ENCOUNTER — Encounter: Payer: Self-pay | Admitting: Vascular Surgery

## 2019-02-03 VITALS — BP 121/68 | HR 100 | Temp 98.9°F | Resp 20 | Ht 66.0 in | Wt 128.0 lb

## 2019-02-03 DIAGNOSIS — I779 Disorder of arteries and arterioles, unspecified: Secondary | ICD-10-CM

## 2019-02-03 NOTE — Progress Notes (Signed)
[place].  He has done well without wound complications.   Subjective:     Patient ID: [name], male   DOB: [birth year], 78 y.o.   MRN: [id number]  HPI 78 year old male status post bilateral above-knee amputations for wounds.  He has done well without wound complications.  He is now staying at [facility].  He presents today for staple removal.  He is accompanied by his daughter.   Review of Systems No complaints today    Objective:   Physical Exam  Vitals:   02/03/19 1313  BP: 121/68  Pulse: 100  Resp: 20  Temp: 98.9 F (37.2 C)  SpO2: 96%    He is awake and alert Bilateral above-knee amputations are healing well with staples that were removed at bedside today.    Assessment:    78 year old male follows up with bilateral above-knee amputations.  They are healing well and staples removed today.     Plan:    Dry dressings placed today after staple removal Refer to [employer] Follow-up on an as-needed basis.  Delfin Squillace C. Donzetta Matters, MD [facility] Office: [phone] Pager: [phone]

## 2019-02-06 ENCOUNTER — Encounter: Payer: Self-pay | Admitting: Internal Medicine

## 2019-02-06 NOTE — Progress Notes (Signed)
Location:  [facility] Room Number: [phone] Place of Service:  [phone])  [name]. Edward Coil, MD  Patient Care Team: Hennie Duos, MD as PCP - General (Internal Medicine) [name], MD as Consulting Physician (Nephrology) Rehab, [place] ([place], [facility]  Extended Emergency Contact Information Primary Emergency Contact: [name],[name] Address: [place], [place] [address] [name] of [place] Phone: [phone] Work Phone: [phone] Mobile Phone: [phone] Relation: Daughter Secondary Emergency Contact: [name] Mobile Phone: [phone] Relation: Other    Allergies: Occlusive silicone sheets [silicone]; Other; and Tape  Chief Complaint  Patient presents with  . Medical Management of Chronic Issues    Routine visit    HPI: Patient is 78 y.o. male who is being seen for routine issues of paroxysmal atrial fibrillation, congestive heart failure, and hypertension.  Past Medical History:  Diagnosis Date  . A-fib ([place])   . Anemia   . Blood transfusion   . BPH (benign prostatic hyperplasia)   . CHF (congestive heart failure) ([name])   . Diarrhea   . DM (diabetes mellitus) ([name])   . ESRD on hemodialysis ([name])    Started dialysis in 2009  . History of GI bleed    secondary to coumadin  . HTN (hypertension)   . Hyperlipidemia   . OSA (obstructive sleep apnea)    uses CPAP  . Secondary hyperparathyroidism of renal origin [facility])     Past Surgical History:  Procedure Laterality Date  . ABDOMINAL AORTOGRAM N/A 11/15/2018   Procedure: ABDOMINAL AORTOGRAM;  Surgeon: [name], MD;  Location: [name];  Service: Cardiovascular;  Laterality: N/A;  . AMPUTATION Left 12/06/2018   Procedure: AMPUTATION DIGIT LEFT FIFTH TOE;  Surgeon: Angelia Mould, MD;  Location: [facility];  Service: Vascular;  Laterality: Left;  . AMPUTATION  Bilateral 12/29/2018   Procedure: AMPUTATION ABOVE KNEE;  Surgeon: Waynetta Sandy, MD;  Location: [facility];  Service: Vascular;  Laterality: Bilateral;  . BVT  4/68/03   Left  Basilic Vein Transposition  . CHOLECYSTECTOMY    . EYE SURGERY     Catarct bil  . INSERTION OF DIALYSIS CATHETER  05/28/2012   Procedure: INSERTION OF DIALYSIS CATHETER;  Surgeon: Mal Misty, MD;  Location: [facility];  Service: Vascular;  Laterality: Right;  . Left arm shuntogram.    . Left forearm loop graft with 6 mm Gore-Tex graft.    . LOWER EXTREMITY ANGIOGRAPHY Bilateral 11/15/2018   Procedure: LOWER EXTREMITY ANGIOGRAPHY;  Surgeon: [name], MD;  Location: [place] CV [name];  Service: Cardiovascular;  Laterality: Bilateral;  . Pars plana vitrectomy with 25-gauge system    . PERIPHERAL VASCULAR ATHERECTOMY Left 11/15/2018   Procedure: PERIPHERAL VASCULAR ATHERECTOMY;  Surgeon: [name], MD;  Location: [name];  Service: Cardiovascular;  Laterality: Left;  SFA with STENT  . PERIPHERAL VASCULAR BALLOON ANGIOPLASTY Left 11/15/2018   Procedure: PERIPHERAL VASCULAR BALLOON ANGIOPLASTY;  Surgeon: [name], MD;  Location: [place] CV [name];  Service: Cardiovascular;  Laterality: Left;  PT    Allergies as of 02/07/2019      Reactions   Occlusive Silicone Sheets [silicone]    Other    Occlusive adhesive   Tape Itching, Other (See Comments)   Cloth tape only      Medication List  Accurate as of February 07, 2019 11:59 PM. Always use your most recent med list.        acetaminophen 500 MG tablet Commonly known as:  TYLENOL Take 1,000 mg by mouth 3 (three) times daily.   atorvastatin 20 MG tablet Commonly known as:  LIPITOR Take 1 tablet (20 mg total) by mouth daily at 6 PM.   calcium acetate 667 MG capsule Commonly known as:  PHOSLO Take 4 capsules (2,668 mg total) by mouth 3 (three) times daily with meals.   doxercalciferol 4 MCG/2ML injection Commonly  known as:  HECTOROL Inject 1 mL (2 mcg total) into the vein every Monday, Wednesday, and Friday with hemodialysis.   feeding supplement (PRO-STAT SUGAR FREE 64) Liqd Take 30 mLs by mouth 2 (two) times daily.   ferrous gluconate 240 (27 FE) MG tablet Commonly known as:  FERGON Take 1 tablet (240 mg total) by mouth 3 (three) times daily with meals.   furosemide 40 MG tablet Commonly known as:  LASIX Take 40 mg by mouth daily.   gentamicin 0.3 % ophthalmic solution Commonly known as:  GARAMYCIN Place 2 drops into both eyes 3 (three) times daily. For conjuctivitis   loperamide 2 MG tablet Commonly known as:  IMODIUM A-D Take 4 mg by mouth See admin instructions. Give 4 mg after 1st loose stool as needed for diarrhea   metoprolol tartrate 25 MG tablet Commonly known as:  LOPRESSOR Take 25 mg by mouth 2 (two) times daily. Do not give before dialysis Mon, Wed, and Fri   midodrine 10 MG tablet Commonly known as:  PROAMATINE Take 10 mg by mouth every Monday, Wednesday, and Friday. 30 minutes prior to dialysis treatment   MIRCERA 200 MCG/0.3ML Sosy Generic drug:  Methoxy PEG-Epoetin Beta Inject 200 mcg as directed every Monday, Wednesday, and Friday.   multivitamin with minerals Tabs tablet Take 1 tablet by mouth daily.   OVER THE COUNTER MEDICATION at bedtime. BIPAP   pantoprazole 40 MG tablet Commonly known as:  PROTONIX Take 1 tablet (40 mg total) by mouth daily.   RENAL VITAMIN PO Take by mouth. TAKE 1 TABLET BY MOUTH ONCE DAILY FOR WOUND HEALING TO SUPPLEMENT DIET   warfarin 2 MG tablet Commonly known as:  COUMADIN Take 2 mg by mouth one time only at 6 PM.       No orders of the defined types were placed in this encounter.    There is no immunization history on file for this patient.  Social History   Tobacco Use  . Smoking status: Former Smoker    Types: Cigarettes    Last attempt to quit: 06/30/2004    Years since quitting: 14.6  . Smokeless tobacco:  Never Used  Substance Use Topics  . Alcohol use: No    Review of Systems  DATA OBTAINED: from nurse GENERAL:  no fevers, fatigue, appetite changes SKIN: No itching, rash HEENT: No complaint RESPIRATORY: No cough, wheezing, SOB CARDIAC: No chest pain, palpitations, lower extremity edema  GI: No abdominal pain, No N/V/D or constipation, No heartburn or reflux  GU: No dysuria, frequency or urgency, or incontinence  MUSCULOSKELETAL: No unrelieved bone/joint pain NEUROLOGIC: No headache, dizziness  PSYCHIATRIC: No overt anxiety or sadness  Vitals:   02/06/19 1457  BP: (!) 113/59  Pulse: 86  Resp: 18  Temp: 98 F (36.7 C)   Body mass index is 20.74 kg/m. Physical Exam  GENERAL APPEARANCE: Alert, conversant, No acute distress  SKIN: No diaphoresis rash  HEENT: Unremarkable RESPIRATORY: Breathing is even, unlabored. Lung sounds are clear   CARDIOVASCULAR: Heart RRR 4/6 high-pitched systolic murmur best heard at right upper sternal border , no rubs or gallops. No peripheral edema  GASTROINTESTINAL: Abdomen is soft, non-tender, not distended w/ normal bowel sounds.  GENITOURINARY: Bladder non tender, not distended  MUSCULOSKELETAL: Bilateral AKA NEUROLOGIC: Cranial nerves 2-12 grossly intact. Moves all extremities PSYCHIATRIC: Mood and affect appropriate to situation, no behavioral issues  Patient Active Problem List   Diagnosis Date Noted  . Conjunctivitis of left eye 01/04/2019  . 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[place]) 12/30/2018  . Open wound of left foot   . Palliative care encounter   . OSA (obstructive sleep apnea)   . HTN (hypertension)   . ESRD on hemodialysis ([place])   . BPH (benign prostatic hyperplasia)   . Hypotension 12/07/2018  . GERD (gastroesophageal reflux disease) 12/07/2018  . Pressure injury of skin 12/03/2018  . Symptomatic anemia 12/02/2018  . Acute on chronic blood loss anemia 12/02/2018  . 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[place]) 12/02/2018    . Lower GI bleeding 12/02/2018  . Heme positive stool   . Acute on chronic diastolic (congestive) heart failure ([place]) 11/19/2018  . Acute CVA (cerebrovascular accident) ([place]) 11/19/2018  . Hypoglycemia 11/19/2018  . Peripheral vascular disease due to secondary diabetes ([place]) 11/19/2018  . Dry gangrene ([place]) 11/19/2018  . 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 DNR (do not resuscitate) discussion   . Goals of care, counseling/discussion   . Palliative care by specialist   . Acute hypoxemic respiratory failure ([place]) 11/06/2018  . Volume overload 11/06/2018  . Multifocal pneumonia 11/06/2018  . Acute metabolic encephalopathy 63/84/6659  . Physical deconditioning 11/06/2018  . Acute respiratory failure with hypoxia and hypercapnia ([place]) 11/04/2018  . CAP (community acquired pneumonia) 11/04/2018  . Pleural effusion, right 11/04/2018  . Increased ammonia level 11/04/2018  . Abnormal CT of the abdomen 11/04/2018  . Atrial fibrillation with RVR ([place]) 11/04/2018  . Sepsis ([place]) 10/21/2018  . Acute encephalopathy 10/21/2018  . Elevated alkaline phosphatase level 10/21/2018  . Chronic anemia 10/21/2018  . Thrombocytopenia ([place]) 10/21/2018  . Pulmonary HTN ([place]) 03/04/2017  . Aortic valve stenosis 03/04/2017  . Hypertension, accelerated with heart disease, without CHF 01/12/2017  . Dyspnea on exertion 01/12/2017  . Hyperlipidemia 04/24/2011  . HYPERTENSION, BENIGN 10/24/2009  . DM (diabetes mellitus) ([place]) 10/23/2009  . OBSTRUCTIVE SLEEP APNEA 10/23/2009  . PAF (paroxysmal atrial fibrillation) ([place]) 10/23/2009  . CHF (congestive heart failure) ([place]) 10/23/2009    CMP     Component Value Date/Time   NA 149 (A) 01/26/2019   K 4.0 02/07/2019   CL 98 01/02/2019 1210   CO2 22 01/02/2019 1210   GLUCOSE 175 (H) 01/02/2019 1210   BUN 78 (A) 01/26/2019   CREATININE 7.3 (A) 01/26/2019   CREATININE 8.36 (H) 01/02/2019 1210   CALCIUM 7.8 (L) 01/02/2019 1210   CALCIUM  8.3 (L) 10/25/2008 1450   PROT 7.0 12/23/2018 1128   ALBUMIN 2.0 (L) 01/02/2019 1210   AST 26 12/23/2018 1128   ALT 20 12/23/2018 1128   ALKPHOS 290 (H) 12/23/2018 1128   BILITOT 0.9 12/23/2018 1128   GFRNONAA 6 (L) 01/02/2019 1210   GFRAA 6 (L) 01/02/2019 1210   Recent Labs    11/08/18 0016 11/09/18 0230  11/14/18 0241  12/30/18 0322 12/31/18 0252 01/02/19 1210 01/26/19 02/07/19  NA 138 137   < > 135   < >  139 140 137 149*  --   K 3.8 4.1   < > 5.0   < > 4.3 3.5 3.9 4.8 4.0  CL 100 100   < > 97*   < > 102 101 98  --   --   CO2 26 27   < > 25   < > 23 27 22   --   --   GLUCOSE 105* 94   < > 139*   < > 173* 147* 175*  --   --   BUN 15 39*   < > 72*   < > 48* 17 64* 78*  --   CREATININE 3.19* 5.34*   < > 7.61*   < > 5.38* 3.25* 8.36* 7.3*  --   CALCIUM 8.8* 9.1   < > 9.8   < > 8.3* 8.2* 7.8*  --   --   MG 2.0 2.2  --  2.4  --   --   --   --   --   --   PHOS  --   --    < > 2.4*   < > 4.8* 3.8 6.2*  --   --    < > = values in this interval not displayed.   Recent Labs    11/09/18 0230  12/01/18 2353  12/23/18 1128  12/30/18 0322 12/31/18 0252 01/02/19 1210  AST 20  --  195*  --  26  --   --   --   --   ALT 26  --  231*  --  20  --   --   --   --   ALKPHOS 197*  --  293*  --  290*  --   --   --   --   BILITOT 0.8  --  1.1  --  0.9  --   --   --   --   PROT 6.9  --  6.6  --  7.0  --   --   --   --   ALBUMIN 2.8*   < > 2.9*   < > 2.4*   < > 2.2* 2.1* 2.0*   < > = values in this interval not displayed.   Recent Labs    12/04/18 0518  12/23/18 1128 12/24/18 0227  12/30/18 0322 12/31/18 0252 01/02/19 1210 01/09/19  WBC 10.4   < > 17.9* 15.6*   < > 25.7* 15.0* 13.4* 8.2  NEUTROABS 7.8*  --  15.5* 13.0*  --   --   --   --   --   HGB 9.5*   < > 9.5* 10.5*   < > 8.2* 8.0* 7.9* 8.1*  HCT 30.6*   < > 31.7* 33.5*   < > 26.0* 26.0* 26.7* 25*  MCV 106.3*   < > 100.6* 99.4   < > 97.7 97.0 98.5  --   PLT 87*   < > 223 204   < > 171 149* 238 185   < > = values in this interval  not displayed.   Recent Labs    11/11/18 1517  CHOL 113  LDLCALC 62  TRIG 60   No results found for: [name] Results  Component Value Date   TSH 1.956 11/09/2018   [name] Results  Component Value Date   HGBA1C 4.2 (L) 11/11/2018   [name] Results  Component Value Date   CHOL 113 11/11/2018   HDL 39 (L) 11/11/2018   [place]  62 11/11/2018   TRIG 60 11/11/2018   CHOLHDL 2.9 11/11/2018    Significant Diagnostic Results in last 30 days:  No results found.  Assessment and Plan  PAF (paroxysmal atrial fibrillation) (HCC) Chronic and stable; continue metoprolol 25 mg twice daily except for no a.m. dose on Monday Wednesday Friday and warfarin as prophylaxis  CHF (congestive heart failure) ([place]) No reported recent problems; continue metoprolol 25 mg twice daily and Lasix 40 mg daily  Hypertension, accelerated with heart disease, without CHF Not a problem; at this point patient is on metoprolol 25 mg twice daily except for he does not get the morning dose on dialysis days Monday Wednesday Friday; he does get Middaugh drain on Monday Wednesday Friday before he goes to dialysis; continue current regimen     Webb Silversmith D. Edward Coil, MD

## 2019-02-07 ENCOUNTER — Non-Acute Institutional Stay (SKILLED_NURSING_FACILITY): Payer: Medicare Other | Admitting: Internal Medicine

## 2019-02-07 DIAGNOSIS — I5032 Chronic diastolic (congestive) heart failure: Secondary | ICD-10-CM

## 2019-02-07 DIAGNOSIS — I119 Hypertensive heart disease without heart failure: Secondary | ICD-10-CM | POA: Diagnosis not present

## 2019-02-07 DIAGNOSIS — I480 Paroxysmal atrial fibrillation: Secondary | ICD-10-CM | POA: Diagnosis not present

## 2019-02-07 LAB — BASIC METABOLIC PANEL: Potassium: 4 (ref 3.4–5.3)

## 2019-02-08 ENCOUNTER — Encounter: Payer: Self-pay | Admitting: Internal Medicine

## 2019-02-08 NOTE — Assessment & Plan Note (Signed)
Chronic and stable; continue metoprolol 25 mg twice daily except for no a.m. dose on Monday Wednesday Friday and warfarin as prophylaxis

## 2019-02-08 NOTE — Assessment & Plan Note (Signed)
Not a problem; at this point patient is on metoprolol 25 mg twice daily except for he does not get the morning dose on dialysis days Monday Wednesday Friday; he does get Middaugh drain on Monday Wednesday Friday before he goes to dialysis; continue current regimen

## 2019-02-08 NOTE — Assessment & Plan Note (Signed)
No reported recent problems; continue metoprolol 25 mg twice daily and Lasix 40 mg daily

## 2019-02-15 ENCOUNTER — Emergency Department (HOSPITAL_COMMUNITY): Payer: Medicare Other

## 2019-02-15 ENCOUNTER — Encounter: Payer: Self-pay | Admitting: Internal Medicine

## 2019-02-15 ENCOUNTER — Non-Acute Institutional Stay (SKILLED_NURSING_FACILITY): Payer: Medicare Other | Admitting: Internal Medicine

## 2019-02-15 ENCOUNTER — Other Ambulatory Visit: Payer: Self-pay

## 2019-02-15 ENCOUNTER — Inpatient Hospital Stay (HOSPITAL_COMMUNITY)
Admission: EM | Admit: 2019-02-15 | Discharge: 2019-02-21 | DRG: 500 | Disposition: A | Discharge status: Skilled Nursing Facility | Payer: Medicare Other | Attending: Family Medicine | Admitting: Family Medicine

## 2019-02-15 DIAGNOSIS — Z66 Do not resuscitate: Secondary | ICD-10-CM | POA: Diagnosis present

## 2019-02-15 DIAGNOSIS — E785 Hyperlipidemia, unspecified: Secondary | ICD-10-CM | POA: Diagnosis present

## 2019-02-15 DIAGNOSIS — E1122 Type 2 diabetes mellitus with diabetic chronic kidney disease: Secondary | ICD-10-CM | POA: Diagnosis present

## 2019-02-15 DIAGNOSIS — I132 Hypertensive heart and chronic kidney disease with heart failure and with stage 5 chronic kidney disease, or end stage renal disease: Secondary | ICD-10-CM | POA: Diagnosis present

## 2019-02-15 DIAGNOSIS — I120 Hypertensive chronic kidney disease with stage 5 chronic kidney disease or end stage renal disease: Secondary | ICD-10-CM

## 2019-02-15 DIAGNOSIS — I480 Paroxysmal atrial fibrillation: Secondary | ICD-10-CM | POA: Diagnosis present

## 2019-02-15 DIAGNOSIS — E1151 Type 2 diabetes mellitus with diabetic peripheral angiopathy without gangrene: Secondary | ICD-10-CM | POA: Diagnosis present

## 2019-02-15 DIAGNOSIS — Z888 Allergy status to other drugs, medicaments and biological substances status: Secondary | ICD-10-CM

## 2019-02-15 DIAGNOSIS — A4101 Sepsis due to Methicillin susceptible Staphylococcus aureus: Secondary | ICD-10-CM | POA: Diagnosis present

## 2019-02-15 DIAGNOSIS — N186 End stage renal disease: Secondary | ICD-10-CM

## 2019-02-15 DIAGNOSIS — G4733 Obstructive sleep apnea (adult) (pediatric): Secondary | ICD-10-CM | POA: Diagnosis present

## 2019-02-15 DIAGNOSIS — I10 Essential (primary) hypertension: Secondary | ICD-10-CM | POA: Diagnosis present

## 2019-02-15 DIAGNOSIS — N400 Enlarged prostate without lower urinary tract symptoms: Secondary | ICD-10-CM | POA: Diagnosis present

## 2019-02-15 DIAGNOSIS — T8743 Infection of amputation stump, right lower extremity: Principal | ICD-10-CM | POA: Diagnosis present

## 2019-02-15 DIAGNOSIS — Z91048 Other nonmedicinal substance allergy status: Secondary | ICD-10-CM

## 2019-02-15 DIAGNOSIS — T8740 Infection of amputation stump, unspecified extremity: Secondary | ICD-10-CM

## 2019-02-15 DIAGNOSIS — Z992 Dependence on renal dialysis: Secondary | ICD-10-CM

## 2019-02-15 DIAGNOSIS — R197 Diarrhea, unspecified: Secondary | ICD-10-CM | POA: Diagnosis present

## 2019-02-15 DIAGNOSIS — N2581 Secondary hyperparathyroidism of renal origin: Secondary | ICD-10-CM | POA: Diagnosis present

## 2019-02-15 DIAGNOSIS — R059 Cough, unspecified: Secondary | ICD-10-CM

## 2019-02-15 DIAGNOSIS — Z89612 Acquired absence of left leg above knee: Secondary | ICD-10-CM | POA: Diagnosis not present

## 2019-02-15 DIAGNOSIS — L089 Local infection of the skin and subcutaneous tissue, unspecified: Secondary | ICD-10-CM

## 2019-02-15 DIAGNOSIS — Z79899 Other long term (current) drug therapy: Secondary | ICD-10-CM

## 2019-02-15 DIAGNOSIS — E8889 Other specified metabolic disorders: Secondary | ICD-10-CM | POA: Diagnosis present

## 2019-02-15 DIAGNOSIS — Z7901 Long term (current) use of anticoagulants: Secondary | ICD-10-CM

## 2019-02-15 DIAGNOSIS — I9589 Other hypotension: Secondary | ICD-10-CM | POA: Diagnosis present

## 2019-02-15 DIAGNOSIS — I509 Heart failure, unspecified: Secondary | ICD-10-CM | POA: Diagnosis not present

## 2019-02-15 DIAGNOSIS — T8789 Other complications of amputation stump: Secondary | ICD-10-CM | POA: Diagnosis not present

## 2019-02-15 DIAGNOSIS — M609 Myositis, unspecified: Secondary | ICD-10-CM | POA: Diagnosis present

## 2019-02-15 DIAGNOSIS — Y835 Amputation of limb(s) as the cause of abnormal reaction of the patient, or of later complication, without mention of misadventure at the time of the procedure: Secondary | ICD-10-CM | POA: Diagnosis present

## 2019-02-15 DIAGNOSIS — Z89611 Acquired absence of right leg above knee: Secondary | ICD-10-CM | POA: Diagnosis not present

## 2019-02-15 DIAGNOSIS — D631 Anemia in chronic kidney disease: Secondary | ICD-10-CM | POA: Diagnosis present

## 2019-02-15 DIAGNOSIS — R05 Cough: Secondary | ICD-10-CM | POA: Diagnosis present

## 2019-02-15 DIAGNOSIS — A419 Sepsis, unspecified organism: Secondary | ICD-10-CM | POA: Diagnosis not present

## 2019-02-15 DIAGNOSIS — Z87891 Personal history of nicotine dependence: Secondary | ICD-10-CM

## 2019-02-15 DIAGNOSIS — L89312 Pressure ulcer of right buttock, stage 2: Secondary | ICD-10-CM | POA: Diagnosis present

## 2019-02-15 DIAGNOSIS — F0390 Unspecified dementia without behavioral disturbance: Secondary | ICD-10-CM | POA: Diagnosis present

## 2019-02-15 DIAGNOSIS — Z8673 Personal history of transient ischemic attack (TIA), and cerebral infarction without residual deficits: Secondary | ICD-10-CM

## 2019-02-15 DIAGNOSIS — Z8249 Family history of ischemic heart disease and other diseases of the circulatory system: Secondary | ICD-10-CM

## 2019-02-15 DIAGNOSIS — Z833 Family history of diabetes mellitus: Secondary | ICD-10-CM

## 2019-02-15 DIAGNOSIS — T148XXA Other injury of unspecified body region, initial encounter: Secondary | ICD-10-CM

## 2019-02-15 LAB — CBC WITH DIFFERENTIAL/PLATELET
Abs Immature Granulocytes: 0.18 10*3/uL — ABNORMAL HIGH (ref 0.00–0.07)
BASOS ABS: 0.1 10*3/uL (ref 0.0–0.1)
Basophils Relative: 0 %
Eosinophils Absolute: 0.2 10*3/uL (ref 0.0–0.5)
Eosinophils Relative: 1 %
HCT: 35.1 % — ABNORMAL LOW (ref 39.0–52.0)
Hemoglobin: 10.1 g/dL — ABNORMAL LOW (ref 13.0–17.0)
Immature Granulocytes: 1 %
Lymphocytes Relative: 8 %
Lymphs Abs: 1.2 10*3/uL (ref 0.7–4.0)
MCH: 26 pg (ref 26.0–34.0)
MCHC: 28.8 g/dL — ABNORMAL LOW (ref 30.0–36.0)
MCV: 90.5 fL (ref 80.0–100.0)
Monocytes Absolute: 1.3 10*3/uL — ABNORMAL HIGH (ref 0.1–1.0)
Monocytes Relative: 9 %
NEUTROS ABS: 11.8 10*3/uL — AB (ref 1.7–7.7)
Neutrophils Relative %: 81 %
Platelets: 227 10*3/uL (ref 150–400)
RBC: 3.88 MIL/uL — AB (ref 4.22–5.81)
RDW: 16.6 % — ABNORMAL HIGH (ref 11.5–15.5)
WBC: 14.7 10*3/uL — ABNORMAL HIGH (ref 4.0–10.5)
nRBC: 0 % (ref 0.0–0.2)

## 2019-02-15 LAB — RENAL FUNCTION PANEL
ANION GAP: 15 (ref 5–15)
Albumin: 2.1 g/dL — ABNORMAL LOW (ref 3.5–5.0)
BUN: 58 mg/dL — ABNORMAL HIGH (ref 8–23)
CO2: 24 mmol/L (ref 22–32)
Calcium: 9 mg/dL (ref 8.9–10.3)
Chloride: 102 mmol/L (ref 98–111)
Creatinine, Ser: 6.5 mg/dL — ABNORMAL HIGH (ref 0.61–1.24)
GFR calc Af Amer: 9 mL/min — ABNORMAL LOW (ref >60)
GFR calc non Af Amer: 7 mL/min — ABNORMAL LOW (ref >60)
Glucose, Bld: 106 mg/dL — ABNORMAL HIGH (ref 70–99)
Phosphorus: 4 mg/dL (ref 2.5–4.6)
Potassium: 4.8 mmol/L (ref 3.5–5.1)
Sodium: 141 mmol/L (ref 135–145)

## 2019-02-15 LAB — COMPREHENSIVE METABOLIC PANEL
ALT: 25 U/L (ref 0–44)
AST: 30 U/L (ref 15–41)
Albumin: 2.2 g/dL — ABNORMAL LOW (ref 3.5–5.0)
Alkaline Phosphatase: 198 U/L — ABNORMAL HIGH (ref 38–126)
Anion gap: 14 (ref 5–15)
BUN: 51 mg/dL — ABNORMAL HIGH (ref 8–23)
CO2: 26 mmol/L (ref 22–32)
Calcium: 9.3 mg/dL (ref 8.9–10.3)
Chloride: 102 mmol/L (ref 98–111)
Creatinine, Ser: 6.4 mg/dL — ABNORMAL HIGH (ref 0.61–1.24)
GFR calc non Af Amer: 8 mL/min — ABNORMAL LOW (ref >60)
GFR, EST AFRICAN AMERICAN: 9 mL/min — AB (ref >60)
Glucose, Bld: 130 mg/dL — ABNORMAL HIGH (ref 70–99)
POTASSIUM: 3.9 mmol/L (ref 3.5–5.1)
Sodium: 142 mmol/L (ref 135–145)
Total Bilirubin: 0.9 mg/dL (ref 0.3–1.2)
Total Protein: 7 g/dL (ref 6.5–8.1)

## 2019-02-15 LAB — CBC
HCT: 34.9 % — ABNORMAL LOW (ref 39.0–52.0)
Hemoglobin: 10.4 g/dL — ABNORMAL LOW (ref 13.0–17.0)
MCH: 26.3 pg (ref 26.0–34.0)
MCHC: 29.8 g/dL — ABNORMAL LOW (ref 30.0–36.0)
MCV: 88.4 fL (ref 80.0–100.0)
Platelets: 209 10*3/uL (ref 150–400)
RBC: 3.95 MIL/uL — ABNORMAL LOW (ref 4.22–5.81)
RDW: 16.6 % — ABNORMAL HIGH (ref 11.5–15.5)
WBC: 12.4 K/uL — ABNORMAL HIGH (ref 4.0–10.5)
nRBC: 0 % (ref 0.0–0.2)

## 2019-02-15 LAB — PROTIME-INR
INR: 2.07
Prothrombin Time: 23 seconds — ABNORMAL HIGH (ref 11.4–15.2)

## 2019-02-15 LAB — LACTIC ACID, PLASMA
Lactic Acid, Venous: 2 mmol/L (ref 0.5–1.9)
Lactic Acid, Venous: 1.8 mmol/L (ref 0.5–1.9)

## 2019-02-15 MED ORDER — ACETAMINOPHEN 325 MG PO TABS
650.0000 mg | ORAL_TABLET | Freq: Four times a day (QID) | ORAL | Status: DC | PRN
  Administered 2019-02-18 – 2019-02-19 (×3): 650 mg via ORAL
  Filled 2019-02-15 (×5): qty 2

## 2019-02-15 MED ORDER — SODIUM CHLORIDE 0.9 % IV SOLN: 100.0000 mL | INTRAVENOUS | Status: DC | PRN

## 2019-02-15 MED ORDER — ALTEPLASE 2 MG IJ SOLR
2.0000 mg | Freq: Once | INTRAMUSCULAR | Status: DC | PRN
  Filled 2019-02-15: qty 2

## 2019-02-15 MED ORDER — LIDOCAINE-PRILOCAINE 2.5-2.5 % EX CREA
1.0000 "application " | TOPICAL_CREAM | CUTANEOUS | Status: DC | PRN
  Filled 2019-02-15: qty 5

## 2019-02-15 MED ORDER — PIPERACILLIN-TAZOBACTAM 3.375 G IVPB
3.3750 g | Freq: Two times a day (BID) | INTRAVENOUS | Status: DC
  Administered 2019-02-16 – 2019-02-20 (×9): 3.375 g via INTRAVENOUS
  Filled 2019-02-15 (×9): qty 50

## 2019-02-15 MED ORDER — VANCOMYCIN HCL 500 MG IV SOLR
500.0000 mg | INTRAVENOUS | Status: DC
  Filled 2019-02-15: qty 500

## 2019-02-15 MED ORDER — LIDOCAINE HCL (PF) 1 % IJ SOLN: 5.0000 mL | INTRAMUSCULAR | Status: DC | PRN

## 2019-02-15 MED ORDER — LIDOCAINE-PRILOCAINE 2.5-2.5 % EX CREA: 1.0000 "application " | TOPICAL_CREAM | CUTANEOUS | Status: DC | PRN

## 2019-02-15 MED ORDER — PIPERACILLIN-TAZOBACTAM 3.375 G IVPB 30 MIN
3.3750 g | Freq: Once | INTRAVENOUS | Status: AC
  Administered 2019-02-15: 3.375 g via INTRAVENOUS
  Filled 2019-02-15: qty 50

## 2019-02-15 MED ORDER — HEPARIN SODIUM (PORCINE) 1000 UNIT/ML DIALYSIS: 1000.0000 [IU] | INTRAMUSCULAR | Status: DC | PRN

## 2019-02-15 MED ORDER — PENTAFLUOROPROP-TETRAFLUOROETH EX AERO: 1.0000 "application " | INHALATION_SPRAY | CUTANEOUS | Status: DC | PRN

## 2019-02-15 MED ORDER — CHLORHEXIDINE GLUCONATE CLOTH 2 % EX PADS: 6.0000 | MEDICATED_PAD | Freq: Every day | CUTANEOUS | Status: DC

## 2019-02-15 MED ORDER — HEPARIN SODIUM (PORCINE) 1000 UNIT/ML DIALYSIS
40.0000 [IU]/kg | Freq: Once | INTRAMUSCULAR | Status: DC
  Filled 2019-02-15: qty 3

## 2019-02-15 MED ORDER — ACETAMINOPHEN 650 MG RE SUPP: 650.0000 mg | Freq: Four times a day (QID) | RECTAL | Status: DC | PRN

## 2019-02-15 MED ORDER — VANCOMYCIN HCL 10 G IV SOLR
1250.0000 mg | Freq: Once | INTRAVENOUS | Status: AC
  Administered 2019-02-15: 1250 mg via INTRAVENOUS
  Filled 2019-02-15: qty 1250

## 2019-02-15 MED ORDER — HEPARIN SODIUM (PORCINE) 1000 UNIT/ML DIALYSIS
1000.0000 [IU] | INTRAMUSCULAR | Status: DC | PRN
  Filled 2019-02-15: qty 1

## 2019-02-15 MED ORDER — ALTEPLASE 2 MG IJ SOLR: 2.0000 mg | Freq: Once | INTRAMUSCULAR | Status: DC | PRN

## 2019-02-15 NOTE — Consult Note (Addendum)
[facility] Renal Consultation Note    Indication for Consultation:  Management of ESRD/hemodialysis, anemia, hypertension/volume, and secondary hyperparathyroidism. PCP:  HPI: [name] is a 78 y.o. male with a history of ESRD, paroxysmal a. Fib on warfarin, CHF, hypertension, DM, dementia, HLD, CVA, OSA.  He is seen in the ED after presenting from [facility] for suspected infection of right stump. He usually dialyzes MWF at [facility] but did not have his treatment today, last HD 02/13/2019. Per outpatient notes, he has been requiring a sitter during treatments.   Patient is lethargic with history of dementia and unable to give full history, information is obtained from chart.  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 Donzetta Matters for PVD. On presentation to the ED, he was mildly tachycardic with HR 108, stable BP and T 98.9, O2 sat 92%. WBC elevated at 14.7, Hgb 10.1, K 3.9, Ca 9.3. Right stump showed signs of infection on exam. Chest x-ray showed mild pulmonary edema. Per pharmacy notes, patient to be started on vancomycin and zosyn. Patient is lethargic on exam today. He states he feels "fine," but is unable to give a full history. He denies any abdominal pain, CP or SOB.   Past Medical History:  Diagnosis Date  . A-fib ([place])   . Anemia   . Blood transfusion   . BPH (benign prostatic hyperplasia)   . CHF (congestive heart failure) ([place])   . Diarrhea   . DM (diabetes mellitus) ([place])   . ESRD on hemodialysis ([place])    Started dialysis in 2009  . History of GI bleed    secondary to coumadin  . HTN (hypertension)   . Hyperlipidemia   . OSA (obstructive sleep apnea)    uses CPAP  . Secondary hyperparathyroidism of renal origin [facility])    Past Surgical History:  Procedure Laterality Date  . ABDOMINAL AORTOGRAM N/A 11/15/2018   Procedure: ABDOMINAL AORTOGRAM;  Surgeon: [name], MD;  Location: [place] [facility];  Service: Cardiovascular;  Laterality:  N/A;  . AMPUTATION Left 12/06/2018   Procedure: AMPUTATION DIGIT LEFT FIFTH TOE;  Surgeon: Angelia Mould, MD;  Location: [place];  Service: Vascular;  Laterality: Left;  . AMPUTATION Bilateral 12/29/2018   Procedure: AMPUTATION ABOVE KNEE;  Surgeon: Waynetta Sandy, MD;  Location: [place];  Service: Vascular;  Laterality: Bilateral;  . BVT  08/21/04   Left  Basilic Vein Transposition  . CHOLECYSTECTOMY    . EYE SURGERY     Catarct bil  . INSERTION OF DIALYSIS CATHETER  05/28/2012   Procedure: INSERTION OF DIALYSIS CATHETER;  Surgeon: Mal Misty, MD;  Location: [place];  Service: Vascular;  Laterality: Right;  . Left arm shuntogram.    . Left forearm loop graft with 6 mm Gore-Tex graft.    . LOWER EXTREMITY ANGIOGRAPHY Bilateral 11/15/2018   Procedure: LOWER EXTREMITY ANGIOGRAPHY;  Surgeon: [name], MD;  Location: [place] [facility];  Service: Cardiovascular;  Laterality: Bilateral;  . Pars plana vitrectomy with 25-gauge system    . PERIPHERAL VASCULAR ATHERECTOMY Left 11/15/2018   Procedure: PERIPHERAL VASCULAR ATHERECTOMY;  Surgeon: [name], MD;  Location: [place] [facility];  Service: Cardiovascular;  Laterality: Left;  SFA with STENT  . PERIPHERAL VASCULAR BALLOON ANGIOPLASTY Left 11/15/2018   Procedure: PERIPHERAL VASCULAR BALLOON ANGIOPLASTY;  Surgeon: [name], MD;  Location: [place] [facility];  Service: Cardiovascular;  Laterality: Left;  PT  Family History  Problem Relation Age of Onset  . Alzheimer's disease Mother   . Diabetes Father        Amputation:  bilateral legs  . Cancer Daughter        breast cancer  . Diabetes Son   . Heart disease Son        before age 17  . Hypertension Son   . Anesthesia problems Neg Hx    Social History:  reports that he quit smoking about 14 years ago. His smoking use included cigarettes. He has never used smokeless tobacco. He reports that he does not drink alcohol or use drugs.  ROS: As per HPI  otherwise negative.  Physical Exam: Vitals:   02/15/19 1137 02/15/19 1233  BP: 120/69   Pulse: (!) 108   Resp: 20   Temp:  98.9 F (37.2 C)  TempSrc:  Rectal  SpO2: 92%      General: Lethargic, ill-appearing male Head: Normocephalic, atraumatic, sclera non-icteric, mucus membranes are moist. Neck: JVD not elevated. Lungs: Clear bilaterally to auscultation without wheezes, rales, or rhonchi.  Heart: Irregular rhythm, mildly tachycardic. No murmurs, rubs, or gallops appreciated. Abdomen: Soft, non-tender, non-distended with normoactive bowel sounds. No rebound/guarding. No obvious abdominal masses. Musculoskeletal: Bilateral AKAs. Right stump incision packed, warm, red. Oozing blood medially.   Lower extremities: No edema.  Neuro: Lethargic but responds to verbal stimuli, answers some questions. Moves all extremities spontaneously. Psych: Unable to assess   Dialysis Access:  Allergies  Allergen Reactions  . Occlusive Silicone Sheets [Silicone]   . Other     Occlusive adhesive  . Tape Itching and Other (See Comments)    Cloth tape only   Prior to Admission medications   Medication Sig Start Date End Date Taking? Authorizing Provider  acetaminophen (TYLENOL) 500 MG tablet Take 1,000 mg by mouth 3 (three) times daily.    [provider]  Amino Acids-Protein Hydrolys (FEEDING SUPPLEMENT, PRO-STAT SUGAR FREE 64,) LIQD Take 30 mLs by mouth 2 (two) times daily.    [provider]  atorvastatin (LIPITOR) 20 MG tablet Take 1 tablet (20 mg total) by mouth daily at 6 PM. 11/17/18 02/06/19  Arrien, Jimmy Picket, MD  B Complex-C-Folic Acid (RENAL VITAMIN PO) Take by mouth. TAKE 1 TABLET BY MOUTH ONCE DAILY FOR WOUND HEALING TO SUPPLEMENT DIET    [provider]  calcium acetate (PHOSLO) 667 MG capsule Take 4 capsules (2,668 mg total) by mouth 3 (three) times daily with meals. 12/04/18   Nita Sells, MD  doxercalciferol (HECTOROL) 4 MCG/2ML injection  Inject 1 mL (2 mcg total) into the vein every Monday, Wednesday, and Friday with hemodialysis. Patient taking differently: Inject 1 mcg into the vein every Monday, Wednesday, and Friday with hemodialysis. 0.5 ml 12/05/18   Nita Sells, MD  ferrous gluconate (FERGON) 240 (27 FE) MG tablet Take 1 tablet (240 mg total) by mouth 3 (three) times daily with meals. 12/04/18   Nita Sells, MD  furosemide (LASIX) 40 MG tablet Take 40 mg by mouth daily.    [provider]  gentamicin (GARAMYCIN) 0.3 % ophthalmic solution Place 2 drops into both eyes 3 (three) times daily. For conjuctivitis    [provider]  loperamide (IMODIUM A-D) 2 MG tablet Take 4 mg by mouth See admin instructions. Give 4 mg after 1st loose stool as needed for diarrhea    [provider]  Methoxy PEG-Epoetin Beta (MIRCERA) 200 MCG/0.3ML SOSY Inject 200 mcg as directed every Monday,  Wednesday, and Friday.    [provider]  metoprolol tartrate (LOPRESSOR) 25 MG tablet Take 25 mg by mouth 2 (two) times daily. Do not give before dialysis Mon, Wed, and Fri    [provider]  midodrine (PROAMATINE) 10 MG tablet Take 10 mg by mouth every Monday, Wednesday, and Friday. 30 minutes prior to dialysis treatment    [provider]  Multiple Vitamin (MULTIVITAMIN WITH MINERALS) TABS tablet Take 1 tablet by mouth daily.    [provider]  OVER THE COUNTER MEDICATION at bedtime. BIPAP    [provider]  pantoprazole (PROTONIX) 40 MG tablet Take 1 tablet (40 mg total) by mouth daily. 01/01/19   Domenic Polite, MD  warfarin (COUMADIN) 2 MG tablet Take 2 mg by mouth one time only at 6 PM.    [provider]   Current Facility-Administered Medications  Medication Dose Route Frequency Provider Last Rate Last Dose  . piperacillin-tazobactam (ZOSYN) IVPB 3.375 g  3.375 g Intravenous Once [place], Carley D, DO      . vancomycin (VANCOCIN) 1,250 mg in sodium  chloride 0.9 % 250 mL IVPB  1,250 mg Intravenous Once Rush Barer, [facility]       Current Outpatient Medications  Medication Sig Dispense Refill  . acetaminophen (TYLENOL) 500 MG tablet Take 1,000 mg by mouth 3 (three) times daily.    . Amino Acids-Protein Hydrolys (FEEDING SUPPLEMENT, PRO-STAT SUGAR FREE 64,) LIQD Take 30 mLs by mouth 2 (two) times daily.    Marland Kitchen atorvastatin (LIPITOR) 20 MG tablet Take 1 tablet (20 mg total) by mouth daily at 6 PM. 30 tablet 0  . B Complex-C-Folic Acid (RENAL VITAMIN PO) Take by mouth. TAKE 1 TABLET BY MOUTH ONCE DAILY FOR WOUND HEALING TO SUPPLEMENT DIET    . calcium acetate (PHOSLO) 667 MG capsule Take 4 capsules (2,668 mg total) by mouth 3 (three) times daily with meals. 90 capsule 0  . doxercalciferol (HECTOROL) 4 MCG/2ML injection Inject 1 mL (2 mcg total) into the vein every Monday, Wednesday, and Friday with hemodialysis. (Patient taking differently: Inject 1 mcg into the vein every Monday, Wednesday, and Friday with hemodialysis. 0.5 ml) 2 mL   . ferrous gluconate (FERGON) 240 (27 FE) MG tablet Take 1 tablet (240 mg total) by mouth 3 (three) times daily with meals. 90 each 0  . furosemide (LASIX) 40 MG tablet Take 40 mg by mouth daily.    Marland Kitchen gentamicin (GARAMYCIN) 0.3 % ophthalmic solution Place 2 drops into both eyes 3 (three) times daily. For conjuctivitis    . loperamide (IMODIUM A-D) 2 MG tablet Take 4 mg by mouth See admin instructions. Give 4 mg after 1st loose stool as needed for diarrhea    . Methoxy PEG-Epoetin Beta (MIRCERA) 200 MCG/0.3ML SOSY Inject 200 mcg as directed every Monday, Wednesday, and Friday.    . metoprolol tartrate (LOPRESSOR) 25 MG tablet Take 25 mg by mouth 2 (two) times daily. Do not give before dialysis Mon, Wed, and Fri    . midodrine (PROAMATINE) 10 MG tablet Take 10 mg by mouth every Monday, Wednesday, and Friday. 30 minutes prior to dialysis treatment    . Multiple Vitamin (MULTIVITAMIN WITH MINERALS) TABS tablet Take 1  tablet by mouth daily.    Marland Kitchen OVER THE COUNTER MEDICATION at bedtime. BIPAP    . pantoprazole (PROTONIX) 40 MG tablet Take 1 tablet (40 mg total) by mouth daily.  0  . warfarin (COUMADIN) 2 MG tablet Take 2  mg by mouth one time only at 6 PM.     Labs: Basic Metabolic Panel: Recent Labs  Lab 02/15/19 1242  NA 142  K 3.9  CL 102  CO2 26  GLUCOSE 130*  BUN 51*  CREATININE 6.40*  CALCIUM 9.3   Liver Function Tests: Recent Labs  Lab 02/15/19 1242  AST 30  ALT 25  ALKPHOS 198*  BILITOT 0.9  PROT 7.0  ALBUMIN 2.2*   CBC: Recent Labs  Lab 02/15/19 1242  WBC 14.7*  NEUTROABS 11.8*  HGB 10.1*  HCT 35.1*  MCV 90.5  PLT 227   Studies/Results: Dg Chest 1 View  Result Date: 02/15/2019 CLINICAL DATA:  Bleeding and drainage at femur amputation site. Possible infection. EXAM: CHEST  1 VIEW COMPARISON:  12/27/2018 FINDINGS: Grossly unchanged enlarged cardiac silhouette and mediastinal contours given supine positioning. Atherosclerotic plaque when the thoracic aorta. The pulmonary vasculature appears less distinct than present examination with cephalization of flow. There is mild elevation/eventration of right hemidiaphragm. No new discrete focal airspace opacities. No supine evidence pleural effusion or pneumothorax. No acute osseous abnormalities. IMPRESSION: Cardiomegaly with suspected mild pulmonary edema on this AP supine portable examination. Further evaluation with a PA and lateral chest radiograph may be obtained as clinically indicated. Electronically Signed   By: Sandi Mariscal M.D.   On: 02/15/2019 13:46   Dg Femur Min 2 Views Right  Result Date: 02/15/2019 CLINICAL DATA:  Bleeding and drainage at site of amputation. Evaluate for infection. EXAM: RIGHT FEMUR 2 VIEWS COMPARISON:  None. FINDINGS: Post right above knee amputation. Scattered foci of subcutaneous emphysema about the distal aspect of the residual limb. No radiopaque foreign body. No discrete areas of osteolysis to  suggest osteomyelitis. Vascular calcifications. Limited visualization of the adjacent hip and lower pelvis is normal. IMPRESSION: 1. Scattered foci of subcutaneous emphysema about the distal aspect the residual limb, nonspecific though in the absence of recent intervention, a gas producing organism could have resulted in a similar appearance. Clinical correlation is advised. Further evaluation with MRI could be performed as indicated. 2. No radiographic evidence of osteomyelitis involving the residual femur. Electronically Signed   By: Sandi Mariscal M.D.   On: 02/15/2019 13:45    Dialysis Orders:  Center: [place]  on MWF . LUE AVF; EDW 56.5.  3K, 2.25 Ca Heparin: none Mircera: 225 mcg IV q2weeks (last dose 02/08/2019) Hecterol/Calcitriol/Sensipar: none  Assessment/Plan: 1.  R stump infection s/p AKA: R stump clinically appears infected and WBC count elevated. 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 Patient started on vancomycin and zosyn per pharmacy. Continue antibiotics per primary team.  MRI pending. 2.  ESRD:  Normally dialyzes MWF but missed HD today. K 3.9. Does not appear significantly volume overloaded on exam. Will plan for treatment tomorrow morning.  3.  Hypertension/volume: No peripheral edema. Chest x-ray shows mild pulmonary edema. BP ok, continue midodrine prior to dialysis. Had been getting below EDW at outpatient unit. Will plan for HD tomorrow morning.   4.  Anemia: Hgb 10.1. Received max Mircera as outpatient on 02/08/2019. Recently completed Venofer bolus.  5.  Metabolic bone disease: Corrected calcium is high, phos pending. Not getting hectoral as an outpatient. Per outpatient notes, calcium acetate on hold for phos 1.9.  No indication for hectoral or calcium acetate at this time.  6.  Nutrition:  Albumin 2.2. Continue pro-stat supplement.  7.  Paroxysmal atrial fibrillation: On metoprolol BID, does not take before dialysis. Continue coumadin per primary team.  8.  Dementia: Poor  historian. Was requiring sitter at outpatient unit for safety (very restless on the machine)   Anice Paganini, PA-C 02/15/2019, 2:09 PM  [facility] Pager: [phone]  Pt seen, examined and agree w A/P as above. ESRD pt w/ dementia and infected R AKA stump.  Surgery was about 6 wks ago.  He does not require HD today, will postpone until tomorrow.  [facility] 02/15/2019, 4:50 PM

## 2019-02-15 NOTE — ED Notes (Signed)
Packing in pt's right knee removed by pt. Pt found to be leaking fluids from site. Site bandaged and secured with medipore tape. Pt continues to flail and attempt to disrupt site regardless if redirection from staff.

## 2019-02-15 NOTE — Progress Notes (Signed)
Spoke with daughter [name] to clear pt prior to MRI exam.  Had recent images today of pt chest and daughter confirmed no surgeries to pt head since December 2019.  We had head imaging going back to December 2019.

## 2019-02-15 NOTE — ED Notes (Signed)
This RN informed Kerrie Pleasure (5N) that despite blood draw prior to departure, lactic could not be run in ED lab prior to pt leaving ED. Order placed for new draw at request of 5N.

## 2019-02-15 NOTE — ED Provider Notes (Signed)
I saw and evaluated the patient, reviewed the resident's note and I agree with the findings and plan.  EKG: None 78 year old male with history of end-stage renal disease presents with concern for possible right AKA stump infection.  On exam patient's wound is packed there is no crepitus to palpation.  Patient does appear to be febrile.  Will order x-rays and blood work and likely admit   Lacretia Leigh, MD 02/15/19 1221

## 2019-02-15 NOTE — ED Notes (Addendum)
Pt escorted to MRI to switch IV abx over to MRI compatible IV pump.

## 2019-02-15 NOTE — Consult Note (Addendum)
ED Consult    Reason for Consult:  Right aka surgical site wound Referring Physician:  Dr. Francia Greaves MRN #:  [id number]  History of Present Illness: This is a 78 y.o. male with recent history of bilateral above-knee amputations.  I saw him in the office approximately 2 weeks ago at that time staples were removed.  He now returns to emergency department drainage from his right above-knee amputation site stump.  He had a white count of 14 yesterday no fevers are documented.  Patient cannot answer many questions currently on dialysis.  Past Medical History:  Diagnosis Date  . A-fib ([place])   . Anemia   . Blood transfusion   . BPH (benign prostatic hyperplasia)   . CHF (congestive heart failure) ([place])   . Diarrhea   . DM (diabetes mellitus) ([place])   . ESRD on hemodialysis ([place])    Started dialysis in 2009  . History of GI bleed    secondary to coumadin  . HTN (hypertension)   . Hyperlipidemia   . OSA (obstructive sleep apnea)    uses CPAP  . Secondary hyperparathyroidism of renal origin [facility])     Past Surgical History:  Procedure Laterality Date  . ABDOMINAL AORTOGRAM N/A 11/15/2018   Procedure: ABDOMINAL AORTOGRAM;  Surgeon: [name], MD;  Location: [place] [facility];  Service: Cardiovascular;  Laterality: N/A;  . AMPUTATION Left 12/06/2018   Procedure: AMPUTATION DIGIT LEFT FIFTH TOE;  Surgeon: Angelia Mould, MD;  Location: [place];  Service: Vascular;  Laterality: Left;  . AMPUTATION Bilateral 12/29/2018   Procedure: AMPUTATION ABOVE KNEE;  Surgeon: Waynetta Sandy, MD;  Location: [place];  Service: Vascular;  Laterality: Bilateral;  . BVT  5/36/14   Left  Basilic Vein Transposition  . CHOLECYSTECTOMY    . EYE SURGERY     Catarct bil  . INSERTION OF DIALYSIS CATHETER  05/28/2012   Procedure: INSERTION OF DIALYSIS CATHETER;  Surgeon: Mal Misty, MD;  Location: [place];  Service: Vascular;  Laterality: Right;  . Left arm shuntogram.    . Left forearm loop  graft with 6 mm Gore-Tex graft.    . LOWER EXTREMITY ANGIOGRAPHY Bilateral 11/15/2018   Procedure: LOWER EXTREMITY ANGIOGRAPHY;  Surgeon: [name], MD;  Location: [place] [facility];  Service: Cardiovascular;  Laterality: Bilateral;  . Pars plana vitrectomy with 25-gauge system    . PERIPHERAL VASCULAR ATHERECTOMY Left 11/15/2018   Procedure: PERIPHERAL VASCULAR ATHERECTOMY;  Surgeon: [name], MD;  Location: [place] [facility];  Service: Cardiovascular;  Laterality: Left;  SFA with STENT  . PERIPHERAL VASCULAR BALLOON ANGIOPLASTY Left 11/15/2018   Procedure: PERIPHERAL VASCULAR BALLOON ANGIOPLASTY;  Surgeon: [name], MD;  Location: [place] [facility];  Service: Cardiovascular;  Laterality: Left;  PT    Allergies  Allergen Reactions  . Occlusive Silicone Sheets [Silicone]   . Other     Occlusive adhesive  . Tape Itching and Other (See Comments)    Cloth tape only    Prior to Admission medications   Medication Sig Start Date End Date Taking? Authorizing Provider  acetaminophen (TYLENOL) 500 MG tablet Take 1,000 mg by mouth 3 (three) times daily.    [provider]  Amino Acids-Protein Hydrolys (FEEDING SUPPLEMENT, PRO-STAT SUGAR FREE 64,) LIQD Take 30 mLs by mouth 2 (two) times daily.    [provider]  atorvastatin (LIPITOR) 20 MG tablet Take 1 tablet (20 mg total) by mouth daily at 6  PM. 11/17/18 02/06/19  Arrien, Jimmy Picket, MD  B Complex-C-Folic Acid (RENAL VITAMIN PO) Take by mouth. TAKE 1 TABLET BY MOUTH ONCE DAILY FOR WOUND HEALING TO SUPPLEMENT DIET    [provider]  doxercalciferol (HECTOROL) 4 MCG/2ML injection Inject 1 mL (2 mcg total) into the vein every Monday, Wednesday, and Friday with hemodialysis. Patient taking differently: Inject 1 mcg into the vein every Monday, Wednesday, and Friday with hemodialysis. 0.5 ml 12/05/18   Nita Sells, MD  ferrous gluconate (FERGON) 240 (27 FE) MG tablet Take 1 tablet (240  mg total) by mouth 3 (three) times daily with meals. 12/04/18   Nita Sells, MD  furosemide (LASIX) 40 MG tablet Take 40 mg by mouth daily.    [provider]  gentamicin (GARAMYCIN) 0.3 % ophthalmic solution Place 2 drops into both eyes 3 (three) times daily. For conjuctivitis    [provider]  loperamide (IMODIUM A-D) 2 MG tablet Take 4 mg by mouth See admin instructions. Give 4 mg after 1st loose stool as needed for diarrhea    [provider]  Methoxy PEG-Epoetin Beta (MIRCERA) 200 MCG/0.3ML SOSY Inject 200 mcg as directed every Monday, Wednesday, and Friday.    [provider]  metoprolol tartrate (LOPRESSOR) 25 MG tablet Take 25 mg by mouth 2 (two) times daily. Do not give before dialysis Mon, Wed, and Fri    [provider]  midodrine (PROAMATINE) 10 MG tablet Take 10 mg by mouth every Monday, Wednesday, and Friday. 30 minutes prior to dialysis treatment    [provider]  Multiple Vitamin (MULTIVITAMIN WITH MINERALS) TABS tablet Take 1 tablet by mouth daily.    [provider]  OVER THE COUNTER MEDICATION at bedtime. BIPAP    [provider]  pantoprazole (PROTONIX) 40 MG tablet Take 1 tablet (40 mg total) by mouth daily. 01/01/19   Domenic Polite, MD  warfarin (COUMADIN) 2 MG tablet Take 2 mg by mouth one time only at 6 PM.    [provider]    Social History   Socioeconomic History  . Marital status: Divorced    Spouse name: Not on file  . Number of children: Not on file  . Years of education: Not on file  . Highest education level: Not on file  Occupational History  . Not on file  Social Needs  . Financial resource strain: Not on file  . Food insecurity:    Worry: Not on file    Inability: Not on file  . Transportation needs:    Medical: Not on file    Non-medical: Not on file  Tobacco Use  . Smoking status: Former Smoker    Types: Cigarettes    Last attempt to quit: 06/30/2004     Years since quitting: 14.6  . Smokeless tobacco: Never Used  Substance and Sexual Activity  . Alcohol use: No  . Drug use: No  . Sexual activity: Never  Lifestyle  . Physical activity:    Days per week: Not on file    Minutes per session: Not on file  . Stress: Not on file  Relationships  . Social connections:    Talks on phone: Not on file    Gets together: Not on file    Attends religious service: Not on file    Active member of club or organization: Not on file    Attends meetings of clubs or organizations: Not on file    Relationship status: Not on file  .  Intimate partner violence:    Fear of current or ex partner: Not on file    Emotionally abused: Not on file    Physically abused: Not on file    Forced sexual activity: Not on file  Other Topics Concern  . Not on file  Social History Narrative     The patient is a former smoker, quit is 2005.  Does not     drink or abuse drugs.  Currently, lives alone at home.           Family History  Problem Relation Age of Onset  . Alzheimer's disease Mother   . Diabetes Father        Amputation:  bilateral legs  . Cancer Daughter        breast cancer  . Diabetes Son   . Heart disease Son        before age 36  . Hypertension Son   . Anesthesia problems Neg Hx     ROS:  Patient has no complaints he is currently on dialysis per  Physical Examination  Vitals:   02/15/19 1400 02/15/19 1415  BP: 131/77 (!) 139/92  Pulse:    Resp: (!) 26 (!) 24  Temp:    SpO2:     There is no height or weight on file to calculate BMI.  General:  nad currently on dialysis HENT: WNL, normocephalic Pulmonary: normal non-labored breathing Abdomen: Soft nontender nondistended bladder Musculoskeletal: Right AKA site medially has a 1 cm opening with evidence of drainage although I cannot appreciate any drainage at this time and there is no surrounding cellulitis  CBC    Component Value Date/Time   WBC 14.7 (H) 02/15/2019 1242   RBC  3.88 (L) 02/15/2019 1242   HGB 10.1 (L) 02/15/2019 1242   HCT 35.1 (L) 02/15/2019 1242   HCT 27.1 (L) 11/09/2018 0230   PLT 227 02/15/2019 1242   MCV 90.5 02/15/2019 1242   MCH 26.0 02/15/2019 1242   MCHC 28.8 (L) 02/15/2019 1242   RDW 16.6 (H) 02/15/2019 1242   LYMPHSABS 1.2 02/15/2019 1242   MONOABS 1.3 (H) 02/15/2019 1242   EOSABS 0.2 02/15/2019 1242   BASOSABS 0.1 02/15/2019 1242    BMET    Component Value Date/Time   NA 142 02/15/2019 1242   NA 146 01/31/2019   K 3.9 02/15/2019 1242   CL 102 02/15/2019 1242   CO2 26 02/15/2019 1242   GLUCOSE 130 (H) 02/15/2019 1242   BUN 51 (H) 02/15/2019 1242   BUN 21 01/31/2019   CREATININE 6.40 (H) 02/15/2019 1242   CALCIUM 9.3 02/15/2019 1242   CALCIUM 8.3 (L) 10/25/2008 1450   GFRNONAA 8 (L) 02/15/2019 1242   GFRAA 9 (L) 02/15/2019 1242    COAGS: Lab Results  Component Value Date   INR 2.07 02/15/2019   INR 1.65 01/03/2019   INR 1.70 01/02/2019     Non-Invasive Vascular Imaging:   IMPRESSION: 1. Complex fluid collections in the soft tissues of the right amputation stump and draped over the distal aspect of the residual femoral diaphysis, concerning for abscess. These may communicate. CT of the right thigh with contrast may be useful for further evaluation given limitations of this study as described above. 2. Prior bilateral above knee amputations. No evidence of osteomyelitis. 3. Nonspecific right vastus lateralis and intermedius myositis, potentially infectious in etiology. 4. Unchanged 1.8 cm non-aggressive appearing lesion in the right femoral head. This demonstrated no uptake on recent bone scan and may  represent a geode or intraosseous ganglion cyst.    ASSESSMENT/PLAN: This is a 78 y.o. male opening medially on his above-knee amputation site.  Leukocytosis improving and there is no surrounding cellulitis to suggest infection at this time.  Can continue Coumadin I will have nursing packed the wound with  iodoform gauze.  N.p.o. at midnight tonight in case it looks worse we could debride in the OR tomorrow but I do believe this to be highly unlikely.  Vernella Niznik C. Donzetta Matters, MD [facility] Office: [phone] Pager: [phone]

## 2019-02-15 NOTE — ED Provider Notes (Signed)
[facility] EMERGENCY DEPARTMENT Provider Note   CSN: [id number] Arrival date & time: 02/15/19  1118    History   Chief Complaint Chief Complaint  Patient presents with  . Wound Infection    HPI [name] is a 77 y.o. male with history of ESRD on HD MWF, DM, HTN, CHF, afib on Warfarin, PVD s/p bilateral AKAs on 12/29/18 who presents from [facility] with concern for infected right AKA stump infection. Patient endorses some mild pain at wound site. No fevers, chills, n/v, abdominal pain, dysuria, or changes in bowel movements.     Past Medical History:  Diagnosis Date  . A-fib ([place])   . Anemia   . Blood transfusion   . BPH (benign prostatic hyperplasia)   . CHF (congestive heart failure) ([place])   . Diarrhea   . DM (diabetes mellitus) ([place])   . ESRD on hemodialysis ([place])    Started dialysis in 2009  . History of GI bleed    secondary to coumadin  . HTN (hypertension)   . Hyperlipidemia   . OSA (obstructive sleep apnea)    uses CPAP  . Secondary hyperparathyroidism of renal origin [facility])     Patient Active Problem List   Diagnosis Date Noted  . Conjunctivitis of left eye 01/04/2019  . 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[place]) 12/30/2018  . Open wound of left foot   . Palliative care encounter   . OSA (obstructive sleep apnea)   . HTN (hypertension)   . ESRD on hemodialysis ([place])   . BPH (benign prostatic hyperplasia)   . Hypotension 12/07/2018  . GERD (gastroesophageal reflux disease) 12/07/2018  . Pressure injury of skin 12/03/2018  . Symptomatic anemia 12/02/2018  . Acute on chronic blood loss anemia 12/02/2018  . 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 Lower GI bleeding 12/02/2018  . Heme positive stool   . Acute on chronic diastolic (congestive) heart failure ([place]) 11/19/2018  . Acute CVA (cerebrovascular accident) ([place]) 11/19/2018  . Hypoglycemia 11/19/2018  . Peripheral vascular disease due to secondary diabetes  ([place]) 11/19/2018  . Dry gangrene ([place]) 11/19/2018  . 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 DNR (do not resuscitate) discussion   . Goals of care, counseling/discussion   . Palliative care by specialist   . Acute hypoxemic respiratory failure ([place]) 11/06/2018  . Volume overload 11/06/2018  . Multifocal pneumonia 11/06/2018  . Acute metabolic encephalopathy 40/98/1191  . Physical deconditioning 11/06/2018  . Acute respiratory failure with hypoxia and hypercapnia ([place]) 11/04/2018  . CAP (community acquired pneumonia) 11/04/2018  . Pleural effusion, right 11/04/2018  . Increased ammonia level 11/04/2018  . Abnormal CT of the abdomen 11/04/2018  . Atrial fibrillation with RVR ([place]) 11/04/2018  . Sepsis ([place]) 10/21/2018  . Acute encephalopathy 10/21/2018  . Elevated alkaline phosphatase level 10/21/2018  . Chronic anemia 10/21/2018  . Thrombocytopenia ([place]) 10/21/2018  . Pulmonary HTN ([place]) 03/04/2017  . Aortic valve stenosis 03/04/2017  . Hypertension, accelerated with heart disease, without CHF 01/12/2017  . Dyspnea on exertion 01/12/2017  . Hyperlipidemia 04/24/2011  . HYPERTENSION, BENIGN 10/24/2009  . DM (diabetes mellitus) ([place]) 10/23/2009  . OBSTRUCTIVE SLEEP APNEA 10/23/2009  . PAF (paroxysmal atrial fibrillation) ([place]) 10/23/2009  . CHF (congestive heart failure) ([place]) 10/23/2009    Past Surgical History:  Procedure Laterality Date  . ABDOMINAL AORTOGRAM N/A 11/15/2018   Procedure: ABDOMINAL AORTOGRAM;  Surgeon: [name], MD;  Location: [place] [facility];  Service: Cardiovascular;  Laterality: N/A;  . AMPUTATION Left 12/06/2018   Procedure: AMPUTATION DIGIT LEFT FIFTH TOE;  Surgeon: Angelia Mould, MD;  Location: [place];  Service: Vascular;  Laterality: Left;  . AMPUTATION Bilateral 12/29/2018   Procedure: AMPUTATION ABOVE KNEE;  Surgeon: Waynetta Sandy, MD;  Location: [place];  Service: Vascular;  Laterality: Bilateral;  . BVT   05/15/83   Left  Basilic Vein Transposition  . CHOLECYSTECTOMY    . EYE SURGERY     Catarct bil  . INSERTION OF DIALYSIS CATHETER  05/28/2012   Procedure: INSERTION OF DIALYSIS CATHETER;  Surgeon: Mal Misty, MD;  Location: [place];  Service: Vascular;  Laterality: Right;  . Left arm shuntogram.    . Left forearm loop graft with 6 mm Gore-Tex graft.    . LOWER EXTREMITY ANGIOGRAPHY Bilateral 11/15/2018   Procedure: LOWER EXTREMITY ANGIOGRAPHY;  Surgeon: [name], MD;  Location: [place] [facility];  Service: Cardiovascular;  Laterality: Bilateral;  . Pars plana vitrectomy with 25-gauge system    . PERIPHERAL VASCULAR ATHERECTOMY Left 11/15/2018   Procedure: PERIPHERAL VASCULAR ATHERECTOMY;  Surgeon: [name], MD;  Location: [place] [facility];  Service: Cardiovascular;  Laterality: Left;  SFA with STENT  . PERIPHERAL VASCULAR BALLOON ANGIOPLASTY Left 11/15/2018   Procedure: PERIPHERAL VASCULAR BALLOON ANGIOPLASTY;  Surgeon: [name], MD;  Location: [place] [facility];  Service: Cardiovascular;  Laterality: Left;  PT        Home Medications    Prior to Admission medications   Medication Sig Start Date End Date Taking? Authorizing Provider  acetaminophen (TYLENOL) 500 MG tablet Take 1,000 mg by mouth 3 (three) times daily.    [provider]  Amino Acids-Protein Hydrolys (FEEDING SUPPLEMENT, PRO-STAT SUGAR FREE 64,) LIQD Take 30 mLs by mouth 2 (two) times daily.    [provider]  atorvastatin (LIPITOR) 20 MG tablet Take 1 tablet (20 mg total) by mouth daily at 6 PM. 11/17/18 02/06/19  Arrien, Jimmy Picket, MD  B Complex-C-Folic Acid (RENAL VITAMIN PO) Take by mouth. TAKE 1 TABLET BY MOUTH ONCE DAILY FOR WOUND HEALING TO SUPPLEMENT DIET    [provider]  calcium acetate (PHOSLO) 667 MG capsule Take 4 capsules (2,668 mg total) by mouth 3 (three) times daily with meals. 12/04/18   Nita Sells, MD  doxercalciferol (HECTOROL) 4  MCG/2ML injection Inject 1 mL (2 mcg total) into the vein every Monday, Wednesday, and Friday with hemodialysis. Patient taking differently: Inject 1 mcg into the vein every Monday, Wednesday, and Friday with hemodialysis. 0.5 ml 12/05/18   Nita Sells, MD  ferrous gluconate (FERGON) 240 (27 FE) MG tablet Take 1 tablet (240 mg total) by mouth 3 (three) times daily with meals. 12/04/18   Nita Sells, MD  furosemide (LASIX) 40 MG tablet Take 40 mg by mouth daily.    [provider]  gentamicin (GARAMYCIN) 0.3 % ophthalmic solution Place 2 drops into both eyes 3 (three) times daily. For conjuctivitis    [provider]  loperamide (IMODIUM A-D) 2 MG tablet Take 4 mg by mouth See admin instructions. Give 4 mg after 1st loose stool as needed for diarrhea    [provider]  Methoxy PEG-Epoetin Beta (MIRCERA) 200 MCG/0.3ML SOSY Inject 200 mcg as directed every Monday, Wednesday, and Friday.    [provider]  metoprolol tartrate (LOPRESSOR) 25 MG tablet Take 25 mg by mouth 2 (two) times daily. Do not give  before dialysis Mon, Wed, and Fri    [provider]  midodrine (PROAMATINE) 10 MG tablet Take 10 mg by mouth every Monday, Wednesday, and Friday. 30 minutes prior to dialysis treatment    [provider]  Multiple Vitamin (MULTIVITAMIN WITH MINERALS) TABS tablet Take 1 tablet by mouth daily.    [provider]  OVER THE COUNTER MEDICATION at bedtime. BIPAP    [provider]  pantoprazole (PROTONIX) 40 MG tablet Take 1 tablet (40 mg total) by mouth daily. 01/01/19   Domenic Polite, MD  warfarin (COUMADIN) 2 MG tablet Take 2 mg by mouth one time only at 6 PM.    [provider]    Family History Family History  Problem Relation Age of Onset  . Alzheimer's disease Mother   . Diabetes Father        Amputation:  bilateral legs  . Cancer Daughter        breast cancer  . Diabetes Son   . Heart disease Son          before age 46  . Hypertension Son   . Anesthesia problems Neg Hx     Social History Social History   Tobacco Use  . Smoking status: Former Smoker    Types: Cigarettes    Last attempt to quit: 06/30/2004    Years since quitting: 14.6  . Smokeless tobacco: Never Used  Substance Use Topics  . Alcohol use: No  . Drug use: No     Allergies   Occlusive silicone sheets [silicone]; Other; and Tape   Review of Systems Review of Systems  All other systems reviewed and are negative.    Physical Exam Updated Vital Signs BP 120/69 (BP Location: Right Arm)   Pulse (!) 108   Temp 98.9 F (37.2 C) (Rectal)   Resp 20   SpO2 92%   Physical Exam Constitutional:      General: He is not in acute distress.    Appearance: He is not toxic-appearing.  HENT:     Head: Normocephalic and atraumatic.     Mouth/Throat:     Mouth: Mucous membranes are moist.     Pharynx: Oropharynx is clear.  Eyes:     Conjunctiva/sclera: Conjunctivae normal.  Neck:     Musculoskeletal: Neck supple.  Cardiovascular:     Rate and Rhythm: Tachycardia present. Rhythm irregular.     Heart sounds: Murmur present.  Pulmonary:     Effort: Pulmonary effort is normal.     Breath sounds: Normal breath sounds.  Abdominal:     General: Bowel sounds are normal.     Palpations: Abdomen is soft.     Tenderness: There is no abdominal tenderness.  Musculoskeletal:     Comments: Right stump wound is packed. Increased warmth with mild tenderness to palpation. No crepitus.   Left AKA incisions healing well without bleeding or drainage.   Lymphadenopathy:     Cervical: No cervical adenopathy.  Skin:    General: Skin is warm and dry.  Neurological:     Mental Status: He is lethargic.     Comments: Patient is oriented to self only. Follows commands. No focal deficits.       ED Treatments / Results  Labs (all labs ordered are listed, but only abnormal results are displayed) Labs Reviewed  PROTIME-INR -  Abnormal; Notable for the following components:      Result Value   Prothrombin Time 23.0 (*)    All other  components within normal limits  LACTIC ACID, PLASMA - Abnormal; Notable for the following components:   Lactic Acid, Venous 2.0 (*)    All other components within normal limits  COMPREHENSIVE METABOLIC PANEL - Abnormal; Notable for the following components:   Glucose, Bld 130 (*)    BUN 51 (*)    Creatinine, Ser 6.40 (*)    Albumin 2.2 (*)    Alkaline Phosphatase 198 (*)    GFR calc non Af Amer 8 (*)    GFR calc Af Amer 9 (*)    All other components within normal limits  CBC WITH DIFFERENTIAL/PLATELET - Abnormal; Notable for the following components:   WBC 14.7 (*)    RBC 3.88 (*)    Hemoglobin 10.1 (*)    HCT 35.1 (*)    MCHC 28.8 (*)    RDW 16.6 (*)    Neutro Abs 11.8 (*)    Monocytes Absolute 1.3 (*)    Abs Immature Granulocytes 0.18 (*)    All other components within normal limits  CULTURE, BLOOD (ROUTINE X 2)  CULTURE, BLOOD (ROUTINE X 2)  LACTIC ACID, PLASMA  URINALYSIS, ROUTINE W REFLEX MICROSCOPIC    EKG EKG Interpretation  Date/Time:  Wednesday February 15 2019 11:38:22 EST Ventricular Rate:  125 PR Interval:    QRS Duration: 79 QT Interval:  352 QTC Calculation: 508 R Axis:   113 Text Interpretation:  Atrial fibrillation Abnormal lateral Q waves Anterior infarct, old Prolonged QT interval Confirmed by Lacretia Leigh ([id number]) on 02/15/2019 2:17:43 PM   Radiology Dg Chest 1 View  Result Date: 02/15/2019 CLINICAL DATA:  Bleeding and drainage at femur amputation site. Possible infection. EXAM: CHEST  1 VIEW COMPARISON:  12/27/2018 FINDINGS: Grossly unchanged enlarged cardiac silhouette and mediastinal contours given supine positioning. Atherosclerotic plaque when the thoracic aorta. The pulmonary vasculature appears less distinct than present examination with cephalization of flow. There is mild elevation/eventration of right hemidiaphragm. No new discrete  focal airspace opacities. No supine evidence pleural effusion or pneumothorax. No acute osseous abnormalities. IMPRESSION: Cardiomegaly with suspected mild pulmonary edema on this AP supine portable examination. Further evaluation with a PA and lateral chest radiograph may be obtained as clinically indicated. Electronically Signed   By: Sandi Mariscal M.D.   On: 02/15/2019 13:46   Dg Femur Min 2 Views Right  Result Date: 02/15/2019 CLINICAL DATA:  Bleeding and drainage at site of amputation. Evaluate for infection. EXAM: RIGHT FEMUR 2 VIEWS COMPARISON:  None. FINDINGS: Post right above knee amputation. Scattered foci of subcutaneous emphysema about the distal aspect of the residual limb. No radiopaque foreign body. No discrete areas of osteolysis to suggest osteomyelitis. Vascular calcifications. Limited visualization of the adjacent hip and lower pelvis is normal. IMPRESSION: 1. Scattered foci of subcutaneous emphysema about the distal aspect the residual limb, nonspecific though in the absence of recent intervention, a gas producing organism could have resulted in a similar appearance. Clinical correlation is advised. Further evaluation with MRI could be performed as indicated. 2. No radiographic evidence of osteomyelitis involving the residual femur. Electronically Signed   By: Sandi Mariscal M.D.   On: 02/15/2019 13:45    Procedures Procedures (including critical care time)  Medications Ordered in ED Medications  piperacillin-tazobactam (ZOSYN) IVPB 3.375 g (3.375 g Intravenous New Bag/Given 02/15/19 1435)  vancomycin (VANCOCIN) 1,250 mg in sodium chloride 0.9 % 250 mL IVPB (has no administration in time range)     Initial Impression / Assessment and Plan / ED Course  I have reviewed the triage vital signs and the nursing notes.  Pertinent labs & imaging results that were available during my care of the patient were reviewed by me and considered in my medical decision making (see chart for  details).   78 y/o ESRD patient with bilateral AKA on 12/29/18 presenting from [facility] with infected right amputation site infection. Wound is packed. No crepitus on exam. Patient is tachycardic and lethargic on exam. Code sepsis initiated.  Work-up reveals leukocytosis of 14. X-ray of right femur with concern for gas producing organism. Have ordered stat non-contrast MRI to further evaluate.  He has been initiated on empiric Vanc and Zoysn.    Final Clinical Impressions(s) / ED Diagnoses   Final diagnoses:  Cough  Wound infection   MRI is still pending which still determine need for urgent surgical intervention versus admit for continued antibiotic therapy. Patient care handed off to Dr. Francia Greaves after discussing case and results. Please see his note for further ED management.    ED Discharge Orders    None       Delice Bison, DO 02/15/19 1507    Lacretia Leigh, MD 02/15/19 1525

## 2019-02-15 NOTE — Progress Notes (Signed)
:  Location:  [facility] Room Number: [phone] Place of Service:  SNF (31)  [name]. Edward Coil, MD  Patient Care Team: Edward Duos, MD as PCP - General (Internal Medicine) Edward Contras, MD as Consulting Physician (Nephrology) Rehab, [place] ([place], [facility]  Extended Emergency Contact Information Primary Emergency Contact: [name] Address: [place], [place] [address] [name] Phone: [phone] Work Phone: [phone] Mobile Phone: [phone] Relation: Daughter Secondary Emergency Contact: [name] Mobile Phone: [phone] Relation: Other     Allergies: Occlusive silicone sheets [silicone]; Other; and Tape  Chief Complaint  Patient presents with  . Acute Visit    Stump drainage    HPI: Patient is 78 y.o. male who is being seen today for drainage from his right AKA stump which is noted for the first time this morning.  Patient has a one by one dehiscence in the medial incisional area with copious amounts of pus.  There is been no fever, chills, nausea, vomiting, or any other systemic symptom.  Patient denies pain on exam.  Past Medical History:  Diagnosis Date  . A-fib ([place])   . Anemia   . Blood transfusion   . BPH (benign prostatic hyperplasia)   . CHF (congestive heart failure) ([place])   . Diarrhea   . DM (diabetes mellitus) ([place])   . ESRD on hemodialysis ([place])    Started dialysis in 2009  . History of GI bleed    secondary to coumadin  . HTN (hypertension)   . Hyperlipidemia   . OSA (obstructive sleep apnea)    uses CPAP  . Secondary hyperparathyroidism of renal origin [facility])     Past Surgical History:  Procedure Laterality Date  . ABDOMINAL AORTOGRAM N/A 11/15/2018   Procedure: ABDOMINAL AORTOGRAM;  Surgeon: [name], MD;  Location: [place] CV LAB;  Service: Cardiovascular;  Laterality: N/A;  . AMPUTATION  Left 12/06/2018   Procedure: AMPUTATION DIGIT LEFT FIFTH TOE;  Surgeon: Angelia Mould, MD;  Location: [place];  Service: Vascular;  Laterality: Left;  . AMPUTATION Bilateral 12/29/2018   Procedure: AMPUTATION ABOVE KNEE;  Surgeon: Waynetta Sandy, MD;  Location: [place];  Service: Vascular;  Laterality: Bilateral;  . BVT  3/53/29   Left  Basilic Vein Transposition  . CHOLECYSTECTOMY    . EYE SURGERY     Catarct bil  . INSERTION OF DIALYSIS CATHETER  05/28/2012   Procedure: INSERTION OF DIALYSIS CATHETER;  Surgeon: Mal Misty, MD;  Location: [place];  Service: Vascular;  Laterality: Right;  . Left arm shuntogram.    . Left forearm loop graft with 6 mm Gore-Tex graft.    . LOWER EXTREMITY ANGIOGRAPHY Bilateral 11/15/2018   Procedure: LOWER EXTREMITY ANGIOGRAPHY;  Surgeon: [name], MD;  Location: [place] CV LAB;  Service: Cardiovascular;  Laterality: Bilateral;  . Pars plana vitrectomy with 25-gauge system    . PERIPHERAL VASCULAR ATHERECTOMY Left 11/15/2018   Procedure: PERIPHERAL VASCULAR ATHERECTOMY;  Surgeon: [name], MD;  Location: [place] CV LAB;  Service: Cardiovascular;  Laterality: Left;  SFA with STENT  . PERIPHERAL VASCULAR BALLOON ANGIOPLASTY Left 11/15/2018   Procedure: PERIPHERAL VASCULAR BALLOON ANGIOPLASTY;  Surgeon: [name], MD;  Location: [place] CV LAB;  Service: Cardiovascular;  Laterality: Left;  PT    Allergies as of 02/15/2019  Reactions   Occlusive Silicone Sheets [silicone]    Other    Occlusive adhesive   Tape Itching, Other (See Comments)   Cloth tape only      Medication List       Accurate as of February 15, 2019  3:00 PM. Always use your most recent med list.        acetaminophen 500 MG tablet Commonly known as:  TYLENOL Take 1,000 mg by mouth 3 (three) times daily.   atorvastatin 20 MG tablet Commonly known as:  LIPITOR Take 1 tablet (20 mg total) by mouth daily at 6 PM.   doxercalciferol  4 MCG/2ML injection Commonly known as:  HECTOROL Inject 1 mL (2 mcg total) into the vein every Monday, Wednesday, and Friday with hemodialysis.   feeding supplement (PRO-STAT SUGAR FREE 64) Liqd Take 30 mLs by mouth 2 (two) times daily.   ferrous gluconate 240 (27 FE) MG tablet Commonly known as:  FERGON Take 1 tablet (240 mg total) by mouth 3 (three) times daily with meals.   furosemide 40 MG tablet Commonly known as:  LASIX Take 40 mg by mouth daily.   gentamicin 0.3 % ophthalmic solution Commonly known as:  GARAMYCIN Place 2 drops into both eyes 3 (three) times daily. For conjuctivitis   loperamide 2 MG tablet Commonly known as:  IMODIUM A-D Take 4 mg by mouth See admin instructions. Give 4 mg after 1st loose stool as needed for diarrhea   metoprolol tartrate 25 MG tablet Commonly known as:  LOPRESSOR Take 25 mg by mouth 2 (two) times daily. Do not give before dialysis Mon, Wed, and Fri   midodrine 10 MG tablet Commonly known as:  PROAMATINE Take 10 mg by mouth every Monday, Wednesday, and Friday. 30 minutes prior to dialysis treatment   MIRCERA 200 MCG/0.3ML Sos[place] Generic drug:  Methoxy PEG-Epoetin Beta Inject 200 mcg as directed every Monday, Wednesday, and Friday.   multivitamin with minerals Tabs tablet Take 1 tablet by mouth daily.   OVER THE COUNTER MEDICATION at bedtime. BIPAP   pantoprazole 40 MG tablet Commonly known as:  PROTONIX Take 1 tablet (40 mg total) by mouth daily.   RENAL VITAMIN PO Take by mouth. TAKE 1 TABLET BY MOUTH ONCE DAILY FOR WOUND HEALING TO SUPPLEMENT DIET   warfarin 2 MG tablet Commonly known as:  COUMADIN Take 2 mg by mouth one time only at 6 PM.       No orders of the defined types were placed in this encounter.    There is no immunization history on file for this patient.  Social History   Tobacco Use  . Smoking status: Former Smoker    Types: Cigarettes    Last attempt to quit: 06/30/2004    Years since quitting:  14.6  . Smokeless tobacco: Never Used  Substance Use Topics  . Alcohol use: No    Family history is   Family History  Problem Relation Age of Onset  . Alzheimer's disease Mother   . Diabetes Father        Amputation:  bilateral legs  . Cancer Daughter        breast cancer  . Diabetes Son   . Heart disease Son        before age 62  . Hypertension Son   . Anesthesia problems Neg Hx       Review of Systems  DATA OBTAINED: from patient- very limited; nursing-as per history present illness GENERAL:  no fevers,  fatigue, appetite changes SKIN: Dehiscence of right AKA incision with drainage of pus EYES: No eye pain, redness, discharge EARS: No earache, tinnitus, change in hearing NOSE: No congestion, drainage or bleeding  MOUTH/THROAT: No mouth or tooth pain, No sore throat RESPIRATORY: No cough, wheezing, SOB CARDIAC: No chest pain, palpitations, lower extremity edema  GI: No abdominal pain, No N/V/D or constipation, No heartburn or reflux  GU: No dysuria, frequency or urgency, or incontinence  MUSCULOSKELETAL: No unrelieved bone/joint pain NEUROLOGIC: No headache, dizziness or focal weakness PSYCHIATRIC: No c/o anxiety or sadness   Vitals:   02/15/19 1456  BP: 134/77  Pulse: 98  Resp: 18  Temp: 98.1 F (36.7 C)    SpO2 Readings from Last 1 Encounters:  02/15/19 92%   Body mass index is 20.85 kg/m.     Physical Exam  GENERAL APPEARANCE: Alert, conversant,  No acute distress.  SKIN: Incisional area without redness or heat but with dehiscence and drainage; thigh is not tender HEAD: Normocephalic, atraumatic  EYES: Conjunctiva/lids clear. Pupils round, reactive. EOMs intact.  EARS: External exam WNL, canals clear. Hearing grossly normal.  NOSE: No deformity or discharge.  MOUTH/THROAT: Lips w/o lesions  RESPIRATORY: Breathing is even, unlabored. Lung sounds are clear   CARDIOVASCULAR: Heart RRR no murmurs, rubs or gallops. No peripheral edema.     GASTROINTESTINAL: Abdomen is soft, non-tender, not distended w/ normal bowel sounds. GENITOURINARY: Bladder non tender, not distended  MUSCULOSKELETAL: Bilateral AKA NEUROLOGIC:  Cranial nerves 2-12 grossly intact. Moves all extremities  PSYCHIATRIC: Mood and affect with dementia, no behavioral issues  Patient Active Problem List   Diagnosis Date Noted  . Conjunctivitis of left eye 01/04/2019  . 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[place]) 12/30/2018  . Open wound of left foot   . Palliative care encounter   . OSA (obstructive sleep apnea)   . HTN (hypertension)   . ESRD on hemodialysis ([place])   . BPH (benign prostatic hyperplasia)   . Hypotension 12/07/2018  . GERD (gastroesophageal reflux disease) 12/07/2018  . Pressure injury of skin 12/03/2018  . Symptomatic anemia 12/02/2018  . Acute on chronic blood loss anemia 12/02/2018  . 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 Lower GI bleeding 12/02/2018  . Heme positive stool   . Acute on chronic diastolic (congestive) heart failure ([place]) 11/19/2018  . Acute CVA (cerebrovascular accident) ([place]) 11/19/2018  . Hypoglycemia 11/19/2018  . Peripheral vascular disease due to secondary diabetes ([place]) 11/19/2018  . Dry gangrene ([place]) 11/19/2018  . 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 DNR (do not resuscitate) discussion   . Goals of care, counseling/discussion   . Palliative care by specialist   . Acute hypoxemic respiratory failure ([place]) 11/06/2018  . Volume overload 11/06/2018  . Multifocal pneumonia 11/06/2018  . Acute metabolic encephalopathy 65/02/5464  . Physical deconditioning 11/06/2018  . Acute respiratory failure with hypoxia and hypercapnia ([employer]) 11/04/2018  . CAP (community acquired pneumonia) 11/04/2018  . Pleural effusion, right 11/04/2018  . Increased ammonia level 11/04/2018  . Abnormal CT of the abdomen 11/04/2018  . Atrial fibrillation with RVR ([place]) 11/04/2018  . Sepsis ([place])  10/21/2018  . Acute encephalopathy 10/21/2018  . Elevated alkaline phosphatase level 10/21/2018  . Chronic anemia 10/21/2018  . Thrombocytopenia ([place]) 10/21/2018  . Pulmonary HTN ([place]) 03/04/2017  . Aortic valve stenosis 03/04/2017  . Hypertension, accelerated with heart disease, without CHF 01/12/2017  . Dyspnea on exertion 01/12/2017  . Hyperlipidemia 04/24/2011  . HYPERTENSION,  BENIGN 10/24/2009  . DM (diabetes mellitus) ([place]) 10/23/2009  . OBSTRUCTIVE SLEEP APNEA 10/23/2009  . PAF (paroxysmal atrial fibrillation) ([place]) 10/23/2009  . CHF (congestive heart failure) ([place]) 10/23/2009      Labs reviewed: Basic Metabolic Panel:    Component Value Date/Time   NA 142 02/15/2019 1242   NA 146 01/31/2019   K 3.9 02/15/2019 1242   CL 102 02/15/2019 1242   CO2 26 02/15/2019 1242   GLUCOSE 130 (H) 02/15/2019 1242   BUN 51 (H) 02/15/2019 1242   BUN 21 01/31/2019   CREATININE 6.40 (H) 02/15/2019 1242   CALCIUM 9.3 02/15/2019 1242   CALCIUM 8.3 (L) 10/25/2008 1450   PROT 7.0 02/15/2019 1242   ALBUMIN 2.2 (L) 02/15/2019 1242   AST 30 02/15/2019 1242   ALT 25 02/15/2019 1242   ALKPHOS 198 (H) 02/15/2019 1242   BILITOT 0.9 02/15/2019 1242   GFRNONAA 8 (L) 02/15/2019 1242   GFRAA 9 (L) 02/15/2019 1242    Recent Labs    11/08/18 0016 11/09/18 0230  11/14/18 0241  12/30/18 0322 12/31/18 0252 01/02/19 1210 01/26/19 01/31/19 02/07/19 02/15/19 1242  NA 138 137   < > 135   < > 139 140 137 149* 146  --  142  K 3.8 4.1   < > 5.0   < > 4.3 3.5 3.9 4.8 3.5 4.0 3.9  CL 100 100   < > 97*   < > 102 101 98  --   --   --  102  CO2 26 27   < > 25   < > 23 27 22   --   --   --  26  GLUCOSE 105* 94   < > 139*   < > 173* 147* 175*  --   --   --  130*  BUN 15 39*   < > 72*   < > 48* 17 64* 78* 21  --  51*  CREATININE 3.19* 5.34*   < > 7.61*   < > 5.38* 3.25* 8.36* 7.3* 3.7*  --  6.40*  CALCIUM 8.8* 9.1   < > 9.8   < > 8.3* 8.2* 7.8*  --   --   --  9.3  MG 2.0 2.2  --  2.4  --   --   --   --    --   --   --   --   PHOS  --   --    < > 2.4*   < > 4.8* 3.8 6.2*  --   --   --   --    < > = values in this interval not displayed.   Liver Function Tests: Recent Labs    12/01/18 2353  12/23/18 1128  12/31/18 0252 01/02/19 1210 02/15/19 1242  AST 195*  --  26  --   --   --  30  ALT 231*  --  20  --   --   --  25  ALKPHOS 293*  --  290*  --   --   --  198*  BILITOT 1.1  --  0.9  --   --   --  0.9  PROT 6.6  --  7.0  --   --   --  7.0  ALBUMIN 2.9*   < > 2.4*   < > 2.1* 2.0* 2.2*   < > = values in this interval not displayed.   Recent Labs  10/21/18 0520 10/24/18 0614  LIPASE 35 32   Recent Labs    10/26/18 0538 11/06/18 1850 11/09/18 0230  AMMONIA 42* 40* 42*   CBC: Recent Labs    12/23/18 1128 12/24/18 0227  12/31/18 0252 01/02/19 1210 01/09/19 02/15/19 1242  WBC 17.9* 15.6*   < > 15.0* 13.4* 8.2 14.7*  NEUTROABS 15.5* 13.0*  --   --   --   --  11.8*  HGB 9.5* 10.5*   < > 8.0* 7.9* 8.1* 10.1*  HCT 31.7* 33.5*   < > 26.0* 26.7* 25* 35.1*  MCV 100.6* 99.4   < > 97.0 98.5  --  90.5  PLT 223 204   < > 149* 238 185 227   < > = values in this interval not displayed.   Lipid Recent Labs    11/11/18 1517  CHOL 113  HDL 39*  LDLCALC 62  TRIG 60    Cardiac Enzymes: Recent Labs    11/06/18 1855  TROPONINI <0.03   BNP: Recent Labs    11/06/18 1905  BNP 1,611.6*   No results found for: [facility] Lab Results  Component Value Date   HGBA1C 4.2 (L) 11/11/2018   Lab Results  Component Value Date   TSH 1.956 11/09/2018   Lab Results  Component Value Date   TFTDDUKG25 427 11/11/2018   Lab Results  Component Value Date   FOLATE  10/17/2008    >20.0 (NOTE)  Reference Ranges        Deficient:       0.4 - 3.3 ng/mL        Indeterminate:   3.4 - 5.4 ng/mL        Normal:              > 5.4 ng/mL   Lab Results  Component Value Date   IRON 48 06/05/2011   TIBC 194 (L) 06/05/2011   FERRITIN 872 (H) 06/05/2011    Imaging and Procedures obtained  prior to SNF admission: Dg Chest 1 View  Result Date: 02/15/2019 CLINICAL DATA:  Bleeding and drainage at femur amputation site. Possible infection. EXAM: CHEST  1 VIEW COMPARISON:  12/27/2018 FINDINGS: Grossly unchanged enlarged cardiac silhouette and mediastinal contours given supine positioning. Atherosclerotic plaque when the thoracic aorta. The pulmonary vasculature appears less distinct than present examination with cephalization of flow. There is mild elevation/eventration of right hemidiaphragm. No new discrete focal airspace opacities. No supine evidence pleural effusion or pneumothorax. No acute osseous abnormalities. IMPRESSION: Cardiomegaly with suspected mild pulmonary edema on this AP supine portable examination. Further evaluation with a PA and lateral chest radiograph may be obtained as clinically indicated. Electronically Signed   By: Sandi Mariscal M.D.   On: 02/15/2019 13:46   Dg Femur Min 2 Views Right  Result Date: 02/15/2019 CLINICAL DATA:  Bleeding and drainage at site of amputation. Evaluate for infection. EXAM: RIGHT FEMUR 2 VIEWS COMPARISON:  None. FINDINGS: Post right above knee amputation. Scattered foci of subcutaneous emphysema about the distal aspect of the residual limb. No radiopaque foreign body. No discrete areas of osteolysis to suggest osteomyelitis. Vascular calcifications. Limited visualization of the adjacent hip and lower pelvis is normal. IMPRESSION: 1. Scattered foci of subcutaneous emphysema about the distal aspect the residual limb, nonspecific though in the absence of recent intervention, a gas producing organism could have resulted in a similar appearance. Clinical correlation is advised. Further evaluation with MRI could be performed as indicated. 2. No radiographic evidence of osteomyelitis involving  the residual femur. Electronically Signed   By: Sandi Mariscal M.D.   On: 02/15/2019 13:45     Not all labs, radiology exams or other studies done during  hospitalization come through on my EPIC note; however they are reviewed by me.    Assessment and Plan  Right stump infection with drainage- [place] wound care nurse consult results in the patient's emergency department, which I would have done in any case; patient being sent    Genelda Roark D. Edward Coil, MD

## 2019-02-15 NOTE — Progress Notes (Signed)
Pharmacy Antibiotic Note  [name] is a 78 y.o. male admitted on 02/15/2019 with sepsis/febrile neutropenia/ possible stump infection.  Pharmacy has been consulted for vancomycin dosing. Pt has hx of bilateral AKAs and ESRD with HD-MWF. WBC 14.7, Lac 2, afebrile.   Pt's last HD session 2/17, per nephrology planning for next HD session tomorrow morning.   Plan: Vancomycin 1250mg  IV x1, then 500mg  IV with HD sessions. Zosyn 3.375mg  IV x1 per MD F/u Cxs, clinical status, LOT/de-escalation, and levels prn     Temp (24hrs), Avg:98.9 F (37.2 C), Min:98.9 F (37.2 C), Max:98.9 F (37.2 C)  Recent Labs  Lab 02/15/19 1242  WBC 14.7*  CREATININE 6.40*  LATICACIDVEN 2.0*    Estimated Creatinine Clearance: 7.8 mL/min (A) (by C-G formula based on SCr of 6.4 mg/dL (H)).    Allergies  Allergen Reactions  . Occlusive Silicone Sheets [Silicone]   . Other     Occlusive adhesive  . Tape Itching and Other (See Comments)    Cloth tape only    Antimicrobials this admission: Vanc 2/19 >> Zosyn 2/19 x1  Dose adjustments this admission:  Microbiology results: 2/19 BCx:    Thank you for allowing pharmacy to be a part of this patient's care.  Harrietta Guardian, PharmD PGY1 Pharmacy Resident 02/15/2019    2:19 PM Please check AMION for all [place] numbers

## 2019-02-15 NOTE — ED Notes (Signed)
Family updated with information regarding plan to admit. RN explained admit process to daughter who is at bedside.

## 2019-02-15 NOTE — Progress Notes (Signed)
Pharmacy Antibiotic Note  [name] is a 78 y.o. male admitted on 02/15/2019 with sepsis/febrile neutropenia/ possible stump infection.  Pharmacy has been consulted for vancomycin dosing and now adding zosyn per pharmacy. Pt has hx of bilateral AKAs and ESRD with HD-MWF. WBC 14.7, Lac 2, afebrile.   Plan: Zosyn 3.375gm IV Q12H (4 hr inf) F/u renal plans, C&S, clinical status and LOT  Temp (24hrs), Avg:98.5 F (36.9 C), Min:98.1 F (36.7 C), Max:98.9 F (37.2 C)  Recent Labs  Lab 02/15/19 1242  WBC 14.7*  CREATININE 6.40*  LATICACIDVEN 2.0*    Estimated Creatinine Clearance: 7.9 mL/min (A) (by C-G formula based on SCr of 6.4 mg/dL (H)).    Allergies  Allergen Reactions  . Occlusive Silicone Sheets [Silicone]   . Other     Occlusive adhesive  . Tape Itching and Other (See Comments)    Cloth tape only    Antimicrobials this admission: Vanc 2/19 >> Zosyn 2/19 >>  Dose adjustments this admission:  Microbiology results: 2/19 BCx:    Thank you for allowing pharmacy to be a part of this patient's care.  Salome Arnt, PharmD, BCPS Please see AMION for all pharmacy numbers 02/15/2019 8:19 PM

## 2019-02-15 NOTE — ED Triage Notes (Signed)
Arrived via [facility] from [facility]. C/c Seen for infection to right thigh. Bleeding noted to bilateral amputee sites.  Dialysis pt- but none today. +dementia, person place and time, not to event EMS v/s 124/76, 100-130hr (hx of A-fib), 96%ra, 99.101F.

## 2019-02-15 NOTE — ED Provider Notes (Signed)
Patient seen after signout.  MRI of right AKA stump suggest possible fluid collection and possible infection.  No evidence of osteomyelitis on MRI.  Dr. Donzetta Matters of vascular is aware of case and will evaluate the patient.  Tried hospital service is aware of case and will evaluate for admission.   Valarie Merino, MD 02/15/19 [phone]

## 2019-02-15 NOTE — ED Notes (Signed)
Got patient undress on the monitor did ekg shown to Dr Zenia Resides got patient cleaned up patient is now resting with call bell in reach

## 2019-02-16 DIAGNOSIS — T8740 Infection of amputation stump, unspecified extremity: Secondary | ICD-10-CM

## 2019-02-16 LAB — BASIC METABOLIC PANEL
Anion gap: 14 (ref 5–15)
BUN: 56 mg/dL — ABNORMAL HIGH (ref 8–23)
CALCIUM: 9.1 mg/dL (ref 8.9–10.3)
CO2: 26 mmol/L (ref 22–32)
Chloride: 103 mmol/L (ref 98–111)
Creatinine, Ser: 6.64 mg/dL — ABNORMAL HIGH (ref 0.61–1.24)
GFR calc Af Amer: 8 mL/min — ABNORMAL LOW (ref >60)
GFR, EST NON AFRICAN AMERICAN: 7 mL/min — AB (ref >60)
Glucose, Bld: 104 mg/dL — ABNORMAL HIGH (ref 70–99)
Potassium: 3.8 mmol/L (ref 3.5–5.1)
Sodium: 143 mmol/L (ref 135–145)

## 2019-02-16 LAB — MRSA PCR SCREENING: MRSA by PCR: POSITIVE — AB

## 2019-02-16 LAB — CBC
HCT: 34.9 % — ABNORMAL LOW (ref 39.0–52.0)
Hemoglobin: 10.2 g/dL — ABNORMAL LOW (ref 13.0–17.0)
MCH: 26.2 pg (ref 26.0–34.0)
MCHC: 29.2 g/dL — ABNORMAL LOW (ref 30.0–36.0)
MCV: 89.7 fL (ref 80.0–100.0)
PLATELETS: 215 10*3/uL (ref 150–400)
RBC: 3.89 MIL/uL — ABNORMAL LOW (ref 4.22–5.81)
RDW: 16.5 % — ABNORMAL HIGH (ref 11.5–15.5)
WBC: 11.7 10*3/uL — ABNORMAL HIGH (ref 4.0–10.5)
nRBC: 0 % (ref 0.0–0.2)

## 2019-02-16 LAB — PROTIME-INR
INR: 1.99
Prothrombin Time: 22.3 seconds — ABNORMAL HIGH (ref 11.4–15.2)

## 2019-02-16 LAB — GLUCOSE, CAPILLARY
Glucose-Capillary: 122 mg/dL — ABNORMAL HIGH (ref 70–99)
Glucose-Capillary: 137 mg/dL — ABNORMAL HIGH (ref 70–99)
Glucose-Capillary: 121 mg/dL — ABNORMAL HIGH (ref 70–99)

## 2019-02-16 LAB — LACTIC ACID, PLASMA: Lactic Acid, Venous: 1.8 mmol/L (ref 0.5–1.9)

## 2019-02-16 LAB — HEPATITIS B SURFACE ANTIGEN: Hepatitis B Surface Ag: NEGATIVE

## 2019-02-16 MED ORDER — VANCOMYCIN HCL IN DEXTROSE 500-5 MG/100ML-% IV SOLN
INTRAVENOUS | Status: AC
  Administered 2019-02-16: 13:00:00
  Filled 2019-02-16: qty 100

## 2019-02-16 MED ORDER — METOPROLOL TARTRATE 25 MG PO TABS
25.0000 mg | ORAL_TABLET | Freq: Two times a day (BID) | ORAL | Status: DC
  Administered 2019-02-16 – 2019-02-20 (×8): 25 mg via ORAL
  Filled 2019-02-16 (×8): qty 1

## 2019-02-16 MED ORDER — MUPIROCIN 2 % EX OINT
1.0000 "application " | TOPICAL_OINTMENT | Freq: Two times a day (BID) | CUTANEOUS | Status: AC
  Administered 2019-02-16 – 2019-02-21 (×10): 1 via NASAL
  Filled 2019-02-16: qty 22

## 2019-02-16 MED ORDER — MIDODRINE HCL 5 MG PO TABS
10.0000 mg | ORAL_TABLET | ORAL | Status: DC
  Administered 2019-02-17 – 2019-02-20 (×2): 10 mg via ORAL
  Filled 2019-02-16 (×2): qty 2

## 2019-02-16 MED ORDER — CHLORHEXIDINE GLUCONATE CLOTH 2 % EX PADS
6.0000 | MEDICATED_PAD | Freq: Every day | CUTANEOUS | Status: AC
  Administered 2019-02-17 – 2019-02-21 (×5): 6 via TOPICAL

## 2019-02-16 NOTE — Progress Notes (Signed)
Patient seen and examined at bedside, patient admitted after midnight, please see earlier detailed admission note by Shela Leff, MD. Briefly, patient presented with stump site infection/abscess. Started empirically on antibiotics. Vascular surgery consulted. Plan for likely conservative management. Blood cultures obtained.   Cordelia Poche, MD [facility] 02/16/2019, 1:31 PM

## 2019-02-16 NOTE — Progress Notes (Signed)
[facility] Progress Note  Subjective: doing well, on HD, no c/o  Vitals:   02/16/19 1100 02/16/19 1127 02/16/19 1215 02/16/19 1217  BP: 97/61 109/65 98/61   Pulse: 84 (!) 111 (!) 125 (!) 130  Resp:  (!) 21 16   Temp:  97.8 F (36.6 C) 98.2 F (36.8 C)   TempSrc:  Oral Oral   SpO2:  95% 100% 97%  Weight:  53.3 kg      Inpatient medications: . [START ON 02/17/2019] midodrine  10 mg Oral Q M,W,F   . piperacillin-tazobactam (ZOSYN)  IV Stopped (02/16/19 0253)  . vancomycin     acetaminophen **OR** acetaminophen  Iron/TIBC/Ferritin/ %Sat    Component Value Date/Time   IRON 48 06/05/2011 0844   TIBC 194 (L) 06/05/2011 0844   FERRITIN 872 (H) 06/05/2011 0844   IRONPCTSAT 25 06/05/2011 0844    Exam: General: Lethargic, ill-appearing male Neck: JVD not elevated. Lungs: Clear bilaterally to auscultation without wheezes, rales, or rhonchi.  Heart: Irregular rhythm, mildly tachycardic. No murmurs, rubs, or gallops Abdomen: Soft, non-tender, non-distended with normoactive bowel sounds.  Musculoskeletal: Bilateral AKAs. Right stump incision packed, warm, red Lower extremities: No edema.  Neuro: awake and alert, no distress   [facility] MWF  LUE AVF   56.5kg  3k/2.25 bath  Hep none  - mircera 225 every 2wks last 2/12  - hect / calc/ sensipar none       Assessment: 1. R stump infection/ recent R AKA 12/29/18: on IV abx , VVS evaluating 2. ESRD on HD MWF: HD today off sched, next HD Sat then MWF 3. HTN/ vol: no ^vol on exam, under dry. Midodrine preHD 4. Anemia ckd: max esa, due on 2/26 5. MBD ckd: cont meds 6. PAF: on metop bid, coumadin    P: 1. As above      [facility] 02/16/2019, 12:45 PM  Recent Labs  Lab 02/15/19 1242 02/15/19 2242 02/16/19 0029  NA 142 141 143  K 3.9 4.8 3.8  CL 102 102 103  CO2 26 24 26   GLUCOSE 130* 106* 104*  BUN 51* 58* 56*  CREATININE 6.40* 6.50* 6.64*  CALCIUM 9.3 9.0 9.1  PHOS  --   4.0  --   ALBUMIN 2.2* 2.1*  --   INR 2.07  --   --    Recent Labs  Lab 02/15/19 1242  AST 30  ALT 25  ALKPHOS 198*  BILITOT 0.9  PROT 7.0   Recent Labs  Lab 02/15/19 1242 02/15/19 2242 02/16/19 0029  WBC 14.7* 12.4* 11.7*  NEUTROABS 11.8*  --   --   HGB 10.1* 10.4* 10.2*  HCT 35.1* 34.9* 34.9*  MCV 90.5 88.4 89.7  PLT 227 209 215

## 2019-02-16 NOTE — H&P (Signed)
History and Physical    [name] HDQ:[id number] DOB: Jul 22, 1941 DOA: 02/15/2019  PCP: Hennie Duos, MD Patient coming from: Nursing Home  Chief Complaint: Drainage from amputation stump site  HPI: [name] is a 78 y.o. 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  Patient is not sure why he is here.  Denies having any pain.  States he was told he has drainage from his amputation stump site.  States he missed his last dialysis session.  No additional history could be obtained from the patient.  Review of Systems: As per HPI otherwise 10 point review of systems negative.  Past Medical History:  Diagnosis Date  . A-fib ([place])   . Anemia   . Blood transfusion   . BPH (benign prostatic hyperplasia)   . CHF (congestive heart failure) ([place])   . Diarrhea   . DM (diabetes mellitus) ([place])   . ESRD on hemodialysis ([place])    Started dialysis in 2009  . History of GI bleed    secondary to coumadin  . HTN (hypertension)   . Hyperlipidemia   . OSA (obstructive sleep apnea)    uses CPAP  . Secondary hyperparathyroidism of renal origin [facility])     Past Surgical History:  Procedure Laterality Date  . ABDOMINAL AORTOGRAM N/A 11/15/2018   Procedure: ABDOMINAL AORTOGRAM;  Surgeon: [name], MD;  Location: [place] [facility];  Service: Cardiovascular;  Laterality: N/A;  . AMPUTATION Left 12/06/2018   Procedure: AMPUTATION DIGIT LEFT FIFTH TOE;  Surgeon: Angelia Mould, MD;  Location: [place];  Service: Vascular;  Laterality: Left;  . AMPUTATION Bilateral 12/29/2018   Procedure: AMPUTATION ABOVE KNEE;  Surgeon: Waynetta Sandy, MD;  Location: [place];  Service: Vascular;  Laterality: Bilateral;  . BVT   01/16/40   Left  Basilic Vein Transposition  . CHOLECYSTECTOMY    . EYE SURGERY     Catarct bil  . INSERTION OF DIALYSIS CATHETER  05/28/2012   Procedure: INSERTION OF DIALYSIS CATHETER;  Surgeon: Mal Misty, MD;  Location: [place];  Service: Vascular;  Laterality: Right;  . Left arm shuntogram.    . Left forearm loop graft with 6 mm Gore-Tex graft.    . LOWER EXTREMITY ANGIOGRAPHY Bilateral 11/15/2018   Procedure: LOWER EXTREMITY ANGIOGRAPHY;  Surgeon: [name], MD;  Location: [place] [facility];  Service: Cardiovascular;  Laterality: Bilateral;  . Pars plana vitrectomy with 25-gauge system    . PERIPHERAL VASCULAR ATHERECTOMY Left 11/15/2018   Procedure: PERIPHERAL VASCULAR ATHERECTOMY;  Surgeon: [name], MD;  Location: [place] [facility];  Service: Cardiovascular;  Laterality: Left;  SFA with STENT  . PERIPHERAL VASCULAR BALLOON ANGIOPLASTY Left 11/15/2018   Procedure: PERIPHERAL VASCULAR BALLOON ANGIOPLASTY;  Surgeon: [name], MD;  Location: [place] [facility];  Service: Cardiovascular;  Laterality: Left;  PT     reports that he quit smoking about 14 years ago. His smoking use included cigarettes. He has never used smokeless tobacco. He reports that he does not drink alcohol or use drugs.  Allergies  Allergen Reactions  . Occlusive Silicone Sheets [Silicone]   . Other     Occlusive adhesive  . Tape Itching and Other (See Comments)  Cloth tape only    Family History  Problem Relation Age of Onset  . Alzheimer's disease Mother   . Diabetes Father        Amputation:  bilateral legs  . Cancer Daughter        breast cancer  . Diabetes Son   . Heart disease Son        before age 60  . Hypertension Son   . Anesthesia problems Neg Hx     Prior to Admission medications   Medication Sig Start Date End Date Taking? Authorizing Provider  calcium acetate (PHOSLO) 667 MG capsule Take 14 capsules by mouth daily.    Yes [provider]    acetaminophen (TYLENOL) 500 MG tablet Take 1,000 mg by mouth 3 (three) times daily.    [provider]  Amino Acids-Protein Hydrolys (FEEDING SUPPLEMENT, PRO-STAT SUGAR FREE 64,) LIQD Take 30 mLs by mouth 2 (two) times daily.    [provider]  atorvastatin (LIPITOR) 20 MG tablet Take 1 tablet (20 mg total) by mouth daily at 6 PM. 11/17/18 02/06/19  Arrien, Jimmy Picket, MD  B Complex-C-Folic Acid (RENAL VITAMIN PO) Take by mouth. TAKE 1 TABLET BY MOUTH ONCE DAILY FOR WOUND HEALING TO SUPPLEMENT DIET    [provider]  doxercalciferol (HECTOROL) 4 MCG/2ML injection Inject 1 mL (2 mcg total) into the vein every Monday, Wednesday, and Friday with hemodialysis. Patient taking differently: Inject 1 mcg into the vein every Monday, Wednesday, and Friday with hemodialysis. 0.5 ml 12/05/18   Nita Sells, MD  ferrous gluconate (FERGON) 240 (27 FE) MG tablet Take 1 tablet (240 mg total) by mouth 3 (three) times daily with meals. 12/04/18   Nita Sells, MD  furosemide (LASIX) 40 MG tablet Take 40 mg by mouth daily.    [provider]  gentamicin (GARAMYCIN) 0.3 % ophthalmic solution Place 2 drops into both eyes 3 (three) times daily. For conjuctivitis    [provider]  loperamide (IMODIUM A-D) 2 MG tablet Take 4 mg by mouth See admin instructions. Give 4 mg after 1st loose stool as needed for diarrhea    [provider]  Methoxy PEG-Epoetin Beta (MIRCERA) 200 MCG/0.3ML SOSY Inject 200 mcg as directed every Monday, Wednesday, and Friday.    [provider]  metoprolol tartrate (LOPRESSOR) 25 MG tablet Take 25 mg by mouth 2 (two) times daily. Do not give before dialysis Mon, Wed, and Fri    [provider]  midodrine (PROAMATINE) 10 MG tablet Take 10 mg by mouth every Monday, Wednesday, and Friday. 30 minutes prior to dialysis treatment    [provider]  Multiple Vitamin (MULTIVITAMIN WITH MINERALS) TABS tablet  Take 1 tablet by mouth daily.    [provider]  OVER THE COUNTER MEDICATION at bedtime. BIPAP    [provider]  pantoprazole (PROTONIX) 40 MG tablet Take 1 tablet (40 mg total) by mouth daily. 01/01/19   Domenic Polite, MD  warfarin (COUMADIN) 2 MG tablet Take 2 mg by mouth one time only at 6 PM.    [provider]    Physical Exam: Vitals:   02/15/19 1930 02/15/19 2015 02/15/19 2100 02/15/19 2203  BP: (!) 145/92 (!) 150/86 (!) 132/93 (!) 141/97  Pulse:  (!) 118 (!) 113 (!) 107  Resp:    16  Temp:    97.7 F (36.5 C)  TempSrc:    Oral  SpO2:  94% 92% 99%    Physical Exam  Constitutional: He is oriented to person, place, and time. No distress.  Appears lethargic  HENT:  Head: Normocephalic.  Mouth/Throat: Oropharynx is clear and moist.  Eyes: Right eye exhibits no discharge. Left eye exhibits no discharge.  Neck: Neck supple.  Cardiovascular: Normal rate, regular rhythm and intact distal pulses.  Pulmonary/Chest: Effort normal and breath sounds normal. No respiratory distress. He has no wheezes. He has no rales.  Abdominal: Soft. Bowel sounds are normal. He exhibits no distension. There is no abdominal tenderness. There is no guarding.  Musculoskeletal:        General: No edema.  Neurological: He is alert and oriented to person, place, and time.  Skin: Skin is warm and dry. He is not diaphoretic.  Copious serosanguineous drainage noted from right AKA stump suture site.     Labs on Admission: I have personally reviewed following labs and imaging studies  CBC: Recent Labs  Lab 02/15/19 1242 02/15/19 2242 02/16/19 0029  WBC 14.7* 12.4* 11.7*  NEUTROABS 11.8*  --   --   HGB 10.1* 10.4* 10.2*  HCT 35.1* 34.9* 34.9*  MCV 90.5 88.4 89.7  PLT 227 209 161   Basic Metabolic Panel: Recent Labs  Lab 02/15/19 1242 02/15/19 2242  NA 142 141  K 3.9 4.8  CL 102 102  CO2 26 24  GLUCOSE 130* 106*  BUN 51* 58*  CREATININE 6.40* 6.50*  CALCIUM  9.3 9.0  PHOS  --  4.0   GFR: Estimated Creatinine Clearance: 7.8 mL/min (A) (by C-G formula based on SCr of 6.5 mg/dL (H)). Liver Function Tests: Recent Labs  Lab 02/15/19 1242 02/15/19 2242  AST 30  --   ALT 25  --   ALKPHOS 198*  --   BILITOT 0.9  --   PROT 7.0  --   ALBUMIN 2.2* 2.1*   No results for input(s): LIPASE, AMYLASE in the last 168 hours. No results for input(s): AMMONIA in the last 168 hours. Coagulation Profile: Recent Labs  Lab 02/15/19 1242  INR 2.07   Cardiac Enzymes: No results for input(s): CKTOTAL, CKMB, CKMBINDEX, TROPONINI in the last 168 hours. BNP (last 3 results) No results for input(s): PROBNP in the last 8760 hours. HbA1C: No results for input(s): HGBA1C in the last 72 hours. CBG: No results for input(s): GLUCAP in the last 168 hours. Lipid Profile: No results for input(s): CHOL, HDL, LDLCALC, TRIG, CHOLHDL, LDLDIRECT in the last 72 hours. Thyroid Function Tests: No results for input(s): TSH, T4TOTAL, FREET4, T3FREE, THYROIDAB in the last 72 hours. Anemia Panel: No results for input(s): VITAMINB12, FOLATE, FERRITIN, TIBC, IRON, RETICCTPCT in the last 72 hours. Urine analysis:    Component Value Date/Time   COLORURINE YELLOW 10/20/2008 [place] 10/20/2008 0942   LABSPEC 1.011 10/20/2008 0942   PHURINE 6.0 10/20/2008 0942   GLUCOSEU NEGATIVE 10/20/2008 0942   HGBUR LARGE (A) 10/20/2008 0942   BILIRUBINUR NEGATIVE 10/20/2008 0942   KETONESUR NEGATIVE 10/20/2008 0942   PROTEINUR 100 (A) 10/20/2008 0942   UROBILINOGEN 1.0 10/20/2008 0942   NITRITE NEGATIVE 10/20/2008 0942   LEUKOCYTESUR SMALL (A) 10/20/2008 0942    Radiological Exams on Admission: Dg Chest 1 View  Result Date: 02/15/2019 CLINICAL DATA:  Bleeding and drainage at femur amputation site. Possible infection. EXAM: CHEST  1 VIEW COMPARISON:  12/27/2018 FINDINGS: Grossly unchanged enlarged cardiac silhouette and mediastinal contours given supine positioning.  Atherosclerotic plaque when the thoracic aorta. The pulmonary vasculature appears less distinct than present examination with  cephalization of flow. There is mild elevation/eventration of right hemidiaphragm. No new discrete focal airspace opacities. No supine evidence pleural effusion or pneumothorax. No acute osseous abnormalities. IMPRESSION: Cardiomegaly with suspected mild pulmonary edema on this AP supine portable examination. Further evaluation with a PA and lateral chest radiograph may be obtained as clinically indicated. Electronically Signed   By: Sandi Mariscal M.D.   On: 02/15/2019 13:46   Mr Frmur Right Wo Contrast  Result Date: 02/15/2019 CLINICAL DATA:  Right AKA stump infection. Recent bilateral AKAs on 12/29/2018. EXAM: MRI OF THE RIGHT FEMUR WITHOUT CONTRAST TECHNIQUE: Multiplanar, multisequence MR imaging of the right femur was performed. No intravenous contrast was administered. COMPARISON:  Right femur x-rays from same day. FINDINGS: Limited examination due to motion artifact and SAR restrictions given history of left SFA stent. Bones/Joint/Cartilage Prior bilateral above knee amputations. No suspicious marrow signal abnormality. Unchanged 1.8 cm well-defined, T2 hyperintense lesion in the right femoral head without surrounding marrow edema. No fracture or dislocation. Normal alignment. No hip joint effusion. Muscles and Tendons Edema within the vastus lateralis and intermedius muscles. Soft tissue Ill-defined 1.7 x 4.2 x 4.2 cm complex fluid collection in the soft tissues of the right amputation stump. This appears to communicate with the skin surface. There is an additional 2.8 x 5.8 x 5.4 cm crescentic, complex fluid collection draped over the distal aspect of the residual femoral diaphysis, which may communicate with the other fluid collection. There is a small amount of soft tissue edema in the left amputation stump site without discrete fluid collection. IMPRESSION: 1. Complex fluid  collections in the soft tissues of the right amputation stump and draped over the distal aspect of the residual femoral diaphysis, concerning for abscess. These may communicate. CT of the right thigh with contrast may be useful for further evaluation given limitations of this study as described above. 2. Prior bilateral above knee amputations. No evidence of osteomyelitis. 3. Nonspecific right vastus lateralis and intermedius myositis, potentially infectious in etiology. 4. Unchanged 1.8 cm non-aggressive appearing lesion in the right femoral head. This demonstrated no uptake on recent bone scan and may represent a geode or intraosseous ganglion cyst. Electronically Signed   By: Titus Dubin M.D.   On: 02/15/2019 17:03   Dg Femur Min 2 Views Right  Result Date: 02/15/2019 CLINICAL DATA:  Bleeding and drainage at site of amputation. Evaluate for infection. EXAM: RIGHT FEMUR 2 VIEWS COMPARISON:  None. FINDINGS: Post right above knee amputation. Scattered foci of subcutaneous emphysema about the distal aspect of the residual limb. No radiopaque foreign body. No discrete areas of osteolysis to suggest osteomyelitis. Vascular calcifications. Limited visualization of the adjacent hip and lower pelvis is normal. IMPRESSION: 1. Scattered foci of subcutaneous emphysema about the distal aspect the residual limb, nonspecific though in the absence of recent intervention, a gas producing organism could have resulted in a similar appearance. Clinical correlation is advised. Further evaluation with MRI could be performed as indicated. 2. No radiographic evidence of osteomyelitis involving the residual femur. Electronically Signed   By: Sandi Mariscal M.D.   On: 02/15/2019 13:45    EKG: Independently reviewed.  Atrial fibrillation (heart rate 125), QTc 508.  Assessment/Plan Principal Problem:   Amputation stump infection ([place]) Active Problems:   DM (diabetes mellitus) ([place])   PAF (paroxysmal atrial fibrillation)  ([facility])   ESRD (end stage renal disease) ([place])   Sepsis ([place])   HTN (hypertension)   Sepsis secondary to right AKA stump infection -Status post  bilateral AKA's on 12/29/2018 -Afebrile.  Tachycardic and tachypneic.  Not hypotensive.  White count 14.7 >12.4.  Lactic acid 2.0 > 1.8.  -MRI of right femur with evidence of stump site abscess.  No evidence of osteomyelitis.  Nonspecific right vastus lateralis and intermedius myositis, potentially infectious in etiology. Unchanged 1.8 cm non-aggressive appearing lesion in the right femoral head; no uptake on recent bone scan and thought to represent a geode or intraosseous ganglion cyst. -ED provider consulted vascular surgery; recommendations pending. Will keep n.p.o. overnight. -Started on vancomycin and Zosyn in the ED. Will continue. -UA, urine culture -Blood culture x2 -Continue monitor CBC -Tylenol PRN  ESRD on HD MWF -Last HD on 2/17.  Potassium 3.9.  Chest x-ray with mild pulmonary edema but patient does not appear significantly volume overloaded on exam. -Seen by nephrology and plan for dialysis in the morning. -Continue to monitor renal function panel  Chronic anemia -Stable.  Hemoglobin 10.1.  Hypertension -Plan for dialysis in the morning.  Continue midodrine prior to dialysis per nephrology recommendation.  Type 2 diabetes -Blood glucose 130.  Currently diet controlled. -Check A1c.  CBG checks.  Paroxysmal atrial fibrillation -CHA2DS2VAsc 7.  INR 2.0.  Hold Coumadin at this time as patient will likely be taken to the OR.   Dementia -Continue to monitor.  Delirium precautions.  OSA -CPAP at night  Unable to safely order home medications at this time as pharmacy medication reconciliation is pending.  DVT prophylaxis: Hold as patient will likely be taken to the OR.  Code Status: DNR.  Discussed with daughter over the phone. Family Communication: Spoke to daughter over the phone. Disposition Plan: Anticipate discharge after  clinical improvement. Consults called: Vascular surgery, nephrology Admission status: It is my clinical opinion that admission to [place] is reasonable and necessary in this 78 y.o. male . presenting with symptoms of right AKA stump drainage, concerning for sepsis secondary to stump site infection . in the context of PMH including: History of peripheral vascular disease and recent bilateral AKA's . with pertinent positives on physical exam including: Drainage from right AKA stump site . and pertinent positives on radiographic and laboratory data including: Leukocytosis, imaging with evidence of stump abscess . Workup and treatment include IV broad-spectrum antibiotics and hemodialysis.  Patient will likely be taken to the OR by vascular surgery.  Given the aforementioned, the predictability of an adverse outcome is felt to be significant. I expect that the patient will require at least 2 midnights in the hospital to treat this condition.   Shela Leff MD [facility] Pager [phone]  If 7PM-7AM, please contact night-coverage [email or web address] Password [email or web address]  02/16/2019, 1:12 AM

## 2019-02-16 NOTE — Progress Notes (Signed)
Pt refusing CPAP for the night. RT will continue to monitor as needed.  

## 2019-02-17 ENCOUNTER — Encounter (HOSPITAL_COMMUNITY)
Admission: EM | Disposition: A | Discharge status: Skilled Nursing Facility | Payer: Self-pay | Source: Home / Self Care | Attending: Family Medicine

## 2019-02-17 ENCOUNTER — Inpatient Hospital Stay (HOSPITAL_COMMUNITY): Payer: Medicare Other | Admitting: Anesthesiology

## 2019-02-17 ENCOUNTER — Encounter: Payer: Self-pay | Admitting: Internal Medicine

## 2019-02-17 ENCOUNTER — Encounter (HOSPITAL_COMMUNITY): Payer: Self-pay | Admitting: Registered Nurse

## 2019-02-17 DIAGNOSIS — T8789 Other complications of amputation stump: Secondary | ICD-10-CM

## 2019-02-17 DIAGNOSIS — Z992 Dependence on renal dialysis: Secondary | ICD-10-CM

## 2019-02-17 DIAGNOSIS — E1122 Type 2 diabetes mellitus with diabetic chronic kidney disease: Secondary | ICD-10-CM

## 2019-02-17 DIAGNOSIS — A419 Sepsis, unspecified organism: Secondary | ICD-10-CM

## 2019-02-17 DIAGNOSIS — I10 Essential (primary) hypertension: Secondary | ICD-10-CM

## 2019-02-17 DIAGNOSIS — N186 End stage renal disease: Secondary | ICD-10-CM

## 2019-02-17 DIAGNOSIS — I480 Paroxysmal atrial fibrillation: Secondary | ICD-10-CM

## 2019-02-17 HISTORY — PX: APPLICATION OF WOUND VAC: SHX5189

## 2019-02-17 HISTORY — PX: I & D EXTREMITY: SHX5045

## 2019-02-17 LAB — CBC
HCT: 37.9 % — ABNORMAL LOW (ref 39.0–52.0)
Hemoglobin: 11.2 g/dL — ABNORMAL LOW (ref 13.0–17.0)
MCH: 25.9 pg — ABNORMAL LOW (ref 26.0–34.0)
MCHC: 29.6 g/dL — ABNORMAL LOW (ref 30.0–36.0)
MCV: 87.5 fL (ref 80.0–100.0)
Platelets: 229 10*3/uL (ref 150–400)
RBC: 4.33 MIL/uL (ref 4.22–5.81)
RDW: 16.2 % — ABNORMAL HIGH (ref 11.5–15.5)
WBC: 10.1 10*3/uL (ref 4.0–10.5)
nRBC: 0 % (ref 0.0–0.2)

## 2019-02-17 LAB — BASIC METABOLIC PANEL
Anion gap: 12 (ref 5–15)
BUN: 23 mg/dL (ref 8–23)
CO2: 26 mmol/L (ref 22–32)
Calcium: 9 mg/dL (ref 8.9–10.3)
Chloride: 101 mmol/L (ref 98–111)
Creatinine, Ser: 4.46 mg/dL — ABNORMAL HIGH (ref 0.61–1.24)
GFR calc Af Amer: 14 mL/min — ABNORMAL LOW (ref >60)
GFR calc non Af Amer: 12 mL/min — ABNORMAL LOW (ref >60)
Glucose, Bld: 142 mg/dL — ABNORMAL HIGH (ref 70–99)
POTASSIUM: 4 mmol/L (ref 3.5–5.1)
Sodium: 139 mmol/L (ref 135–145)

## 2019-02-17 LAB — GLUCOSE, CAPILLARY
Glucose-Capillary: 105 mg/dL — ABNORMAL HIGH (ref 70–99)
Glucose-Capillary: 107 mg/dL — ABNORMAL HIGH (ref 70–99)
Glucose-Capillary: 129 mg/dL — ABNORMAL HIGH (ref 70–99)
Glucose-Capillary: 143 mg/dL — ABNORMAL HIGH (ref 70–99)
Glucose-Capillary: 126 mg/dL — ABNORMAL HIGH (ref 70–99)

## 2019-02-17 LAB — HEPATITIS B CORE ANTIBODY, TOTAL: Hep B Core Total Ab: NEGATIVE

## 2019-02-17 LAB — PROTIME-INR
INR: 2.06
Prothrombin Time: 23 seconds — ABNORMAL HIGH (ref 11.4–15.2)

## 2019-02-17 LAB — HEMOGLOBIN A1C
Hgb A1c MFr Bld: 4.9 % (ref 4.8–5.6)
Mean Plasma Glucose: 94 mg/dL

## 2019-02-17 LAB — HEPATITIS B SURFACE ANTIBODY,QUALITATIVE: Hep B S Ab: NONREACTIVE

## 2019-02-17 SURGERY — IRRIGATION AND DEBRIDEMENT EXTREMITY
Anesthesia: General | Site: Leg Upper | Laterality: Right

## 2019-02-17 MED ORDER — FENTANYL CITRATE (PF) 250 MCG/5ML IJ SOLN
INTRAMUSCULAR | Status: DC | PRN
  Administered 2019-02-17 (×3): 25 ug via INTRAVENOUS

## 2019-02-17 MED ORDER — LIDOCAINE 2% (20 MG/ML) 5 ML SYRINGE
INTRAMUSCULAR | Status: DC | PRN
  Administered 2019-02-17: 100 mg via INTRAVENOUS

## 2019-02-17 MED ORDER — VANCOMYCIN HCL IN DEXTROSE 1-5 GM/200ML-% IV SOLN: 1000.0000 mg | Freq: Once | INTRAVENOUS | Status: DC

## 2019-02-17 MED ORDER — PROPOFOL 10 MG/ML IV BOLUS
INTRAVENOUS | Status: AC
  Filled 2019-02-17: qty 20

## 2019-02-17 MED ORDER — VANCOMYCIN HCL 1000 MG IV SOLR
INTRAVENOUS | Status: DC | PRN
  Administered 2019-02-17: 1000 mg via INTRAVENOUS

## 2019-02-17 MED ORDER — DEXAMETHASONE SODIUM PHOSPHATE 10 MG/ML IJ SOLN
INTRAMUSCULAR | Status: DC | PRN
  Administered 2019-02-17: 4 mg via INTRAVENOUS

## 2019-02-17 MED ORDER — VANCOMYCIN HCL IN DEXTROSE 1-5 GM/200ML-% IV SOLN
INTRAVENOUS | Status: AC
  Filled 2019-02-17: qty 200

## 2019-02-17 MED ORDER — FENTANYL CITRATE (PF) 250 MCG/5ML IJ SOLN
INTRAMUSCULAR | Status: AC
  Filled 2019-02-17: qty 5

## 2019-02-17 MED ORDER — 0.9 % SODIUM CHLORIDE (POUR BTL) OPTIME
TOPICAL | Status: DC | PRN
  Administered 2019-02-17: 1000 mL

## 2019-02-17 MED ORDER — SUGAMMADEX SODIUM 200 MG/2ML IV SOLN
INTRAVENOUS | Status: DC | PRN
  Administered 2019-02-17 (×2): 100 mg via INTRAVENOUS

## 2019-02-17 MED ORDER — SODIUM CHLORIDE 0.9 % IV SOLN
INTRAVENOUS | Status: DC
  Administered 2019-02-17: 11:00:00 via INTRAVENOUS

## 2019-02-17 MED ORDER — ROCURONIUM BROMIDE 100 MG/10ML IV SOLN
INTRAVENOUS | Status: DC | PRN
  Administered 2019-02-17: 30 mg via INTRAVENOUS

## 2019-02-17 MED ORDER — ETOMIDATE 2 MG/ML IV SOLN
INTRAVENOUS | Status: DC | PRN
  Administered 2019-02-17: 10 mg via INTRAVENOUS

## 2019-02-17 MED ORDER — ONDANSETRON HCL 4 MG/2ML IJ SOLN
INTRAMUSCULAR | Status: DC | PRN
  Administered 2019-02-17: 4 mg via INTRAVENOUS

## 2019-02-17 SURGICAL SUPPLY — 35 items
BANDAGE ACE 4X5 VEL STRL LF (GAUZE/BANDAGES/DRESSINGS) IMPLANT
BANDAGE ACE 6X5 VEL STRL LF (GAUZE/BANDAGES/DRESSINGS) IMPLANT
BNDG GAUZE ELAST 4 BULKY (GAUZE/BANDAGES/DRESSINGS) IMPLANT
CANISTER SUCT 3000ML PPV (MISCELLANEOUS) ×4 IMPLANT
COVER SURGICAL LIGHT HANDLE (MISCELLANEOUS) ×6 IMPLANT
COVER WAND RF STERILE (DRAPES) ×2 IMPLANT
DRAPE HALF SHEET 40X57 (DRAPES) ×4 IMPLANT
DRAPE INCISE IOBAN 66X45 STRL (DRAPES) IMPLANT
DRAPE ORTHO SPLIT 77X108 STRL (DRAPES) ×8
DRAPE SURG ORHT 6 SPLT 77X108 (DRAPES) IMPLANT
DRSG TEGADERM 4X4.75 (GAUZE/BANDAGES/DRESSINGS) ×2 IMPLANT
DRSG VAC ATS MED SENSATRAC (GAUZE/BANDAGES/DRESSINGS) ×2 IMPLANT
ELECT REM PT RETURN 9FT ADLT (ELECTROSURGICAL) ×4
ELECTRODE REM PT RTRN 9FT ADLT (ELECTROSURGICAL) ×2 IMPLANT
GAUZE SPONGE 4X4 12PLY STRL (GAUZE/BANDAGES/DRESSINGS) ×4 IMPLANT
GAUZE SPONGE 4X4 12PLY STRL LF (GAUZE/BANDAGES/DRESSINGS) ×2 IMPLANT
GLOVE BIO SURGEON STRL SZ7.5 (GLOVE) ×4 IMPLANT
GLOVE BIOGEL PI IND STRL 8 (GLOVE) ×2 IMPLANT
GLOVE BIOGEL PI INDICATOR 8 (GLOVE) ×2
GOWN STRL REUS W/ TWL LRG LVL3 (GOWN DISPOSABLE) ×4 IMPLANT
GOWN STRL REUS W/ TWL XL LVL3 (GOWN DISPOSABLE) ×4 IMPLANT
GOWN STRL REUS W/TWL LRG LVL3 (GOWN DISPOSABLE) ×4
GOWN STRL REUS W/TWL XL LVL3 (GOWN DISPOSABLE) ×4
KIT BASIN OR (CUSTOM PROCEDURE TRAY) ×4 IMPLANT
KIT TURNOVER KIT B (KITS) ×4 IMPLANT
NS IRRIG 1000ML POUR BTL (IV SOLUTION) ×4 IMPLANT
PACK GENERAL/GYN (CUSTOM PROCEDURE TRAY) ×2 IMPLANT
PAD ARMBOARD 7.5X6 YLW CONV (MISCELLANEOUS) ×8 IMPLANT
SUT ETHILON 3 0 PS 1 (SUTURE) IMPLANT
SUT MNCRL AB 4-0 PS2 18 (SUTURE) IMPLANT
SUT VIC AB 2-0 CTX 36 (SUTURE) IMPLANT
SUT VIC AB 3-0 SH 27 (SUTURE)
SUT VIC AB 3-0 SH 27X BRD (SUTURE) IMPLANT
TOWEL GREEN STERILE (TOWEL DISPOSABLE) ×2 IMPLANT
WATER STERILE IRR 1000ML POUR (IV SOLUTION) ×2 IMPLANT

## 2019-02-17 NOTE — Anesthesia Procedure Notes (Signed)
Arterial Line Insertion Start/End2/21/2020 10:45 AM, 02/17/2019 11:00 AM Performed by: Jearld Pies, CRNA, CRNA  Patient location: Pre-op. Preanesthetic checklist: patient identified, IV checked, site marked, risks and benefits discussed, surgical consent, monitors and equipment checked, pre-op evaluation, timeout performed and anesthesia consent Lidocaine 1% used for infiltration Right, radial was placed Catheter size: 20 G Hand hygiene performed , maximum sterile barriers used  and Seldinger technique used Allen's test indicative of satisfactory collateral circulation Attempts: 1 Procedure performed without using ultrasound guided technique. Following insertion, dressing applied and Biopatch. Post procedure assessment: normal  Patient tolerated the procedure well with no immediate complications.

## 2019-02-17 NOTE — Progress Notes (Signed)
PROGRESS NOTE    [name]  SPQ:[id number] DOB: 1941-02-29 DOA: 02/15/2019 PCP: Hennie Duos, MD   Brief Narrative: [name] is a 78 y.o. 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 Patient here with sepsis secondary to a right stump infection. Currently on antibiotics with plans for debridement.   Assessment & Plan:   Principal Problem:   Amputation stump infection ([place]) Active Problems:   DM (diabetes mellitus) ([place])   PAF (paroxysmal atrial fibrillation) ([facility])   ESRD (end stage renal disease) ([place])   Sepsis ([place])   HTN (hypertension)   Right stump infection Evidence of fluid collections on CT scan. Concern for abscess. Currently draining purulent fluid. Elevated temperature overnight. -Continue Vancomycin IV/Zosyn -Vascular surgery recommendations: possible debridement  Sepsis Secondary to stump infection. Physiology resolved.  ESRD on HD Nephrology consulted. Per nephrology.  Chronic anemia Stable.  Essential hypertension Stable. Gets midodrine on HD days -Continue metoprolol.   Diabetes mellitus, type 2 Diet controlled. Hemoglobin A1C of 4.9%.  Paroxysmal atrial fibrillation On coumadin as an outpatient. Currently held for possible procedure -Heparin for INR <2 -Continue metoprolol  Dementia Stable  OSA -CPAP qhs  Pressure injury Right buttock. POA.   DVT prophylaxis: Coumadin Code Status:   Code Status: DNR Family Communication: None at bedside Disposition Plan: Discharge pending vascular recommendations   Consultants:   Vascular surgery  Procedures:   None  Antimicrobials:  Vancomycin  Zosyn    Subjective: Felt cold overnight. No other concerns  Objective: Vitals:   02/16/19 1217 02/16/19 1542 02/16/19 2204 02/17/19 0354  BP:  104/70 117/75  125/76  Pulse: (!) 130 94 98 (!) 105  Resp:  16 18 18   Temp:  98.7 F (37.1 C) 98.6 F (37 C) 100.1 F (37.8 C)  TempSrc:  Oral Oral Oral  SpO2: 97% 96% 98% 100%  Weight:        Intake/Output Summary (Last 24 hours) at 02/17/2019 0845 Last data filed at 02/16/2019 1700 Gross per 24 hour  Intake 240 ml  Output 1600 ml  Net -1360 ml   Filed Weights   02/16/19 0719 02/16/19 1127  Weight: 54.8 kg 53.3 kg    Examination:  General exam: Appears calm and comfortable Respiratory system: Clear to auscultation. Respiratory effort normal. Cardiovascular system: S1 & S2 heard, RRR. 2/6 systolic murmur Gastrointestinal system: Abdomen is nondistended, soft and nontender. No organomegaly or masses felt. Normal bowel sounds heard. Central nervous system: Alert and oriented. No focal neurological deficits. Extremities: Bilateral AKA. Right stump with purulent draining wound. Skin: No cyanosis. No rashes Psychiatry: Judgement and insight appear normal. Mood & affect appropriate.     Data Reviewed: I have personally reviewed following labs and imaging studies  CBC: Recent Labs  Lab 02/15/19 1242 02/15/19 2242 02/16/19 0029  WBC 14.7* 12.4* 11.7*  NEUTROABS 11.8*  --   --   HGB 10.1* 10.4* 10.2*  HCT 35.1* 34.9* 34.9*  MCV 90.5 88.4 89.7  PLT 227 209 333   Basic Metabolic Panel: Recent Labs  Lab 02/15/19 1242 02/15/19 2242 02/16/19 0029  NA 142 141 143  K 3.9 4.8 3.8  CL 102 102 103  CO2 26 24 26   GLUCOSE 130* 106* 104*  BUN 51* 58* 56*  CREATININE 6.40* 6.50* 6.64*  CALCIUM 9.3 9.0 9.1  PHOS  --  4.0  --  GFR: Estimated Creatinine Clearance: 6.9 mL/min (A) (by C-G formula based on SCr of 6.64 mg/dL (H)). Liver Function Tests: Recent Labs  Lab 02/15/19 1242 02/15/19 2242  AST 30  --   ALT 25  --   ALKPHOS 198*  --   BILITOT 0.9  --   PROT 7.0  --   ALBUMIN 2.2* 2.1*   No results for input(s): LIPASE, AMYLASE in the last 168 hours. No results for  input(s): AMMONIA in the last 168 hours. Coagulation Profile: Recent Labs  Lab 02/15/19 1242 02/16/19 1257 02/17/19 0337  INR 2.07 1.99 2.06   Cardiac Enzymes: No results for input(s): CKTOTAL, CKMB, CKMBINDEX, TROPONINI in the last 168 hours. BNP (last 3 results) No results for input(s): PROBNP in the last 8760 hours. HbA1C: Recent Labs    02/16/19 0410  HGBA1C 4.9   CBG: Recent Labs  Lab 02/16/19 1212 02/16/19 1619 02/16/19 2216 02/17/19 0551  GLUCAP 122* 137* 121* 105*   Lipid Profile: No results for input(s): CHOL, HDL, LDLCALC, TRIG, CHOLHDL, LDLDIRECT in the last 72 hours. Thyroid Function Tests: No results for input(s): TSH, T4TOTAL, FREET4, T3FREE, THYROIDAB in the last 72 hours. Anemia Panel: No results for input(s): VITAMINB12, FOLATE, FERRITIN, TIBC, IRON, RETICCTPCT in the last 72 hours. Sepsis Labs: Recent Labs  Lab 02/15/19 1242 02/15/19 2242 02/16/19 0029  LATICACIDVEN 2.0* 1.8 1.8    Recent Results (from the past 240 hour(s))  Blood Culture (routine x 2)     Status: None (Preliminary result)   Collection Time: 02/15/19 12:39 PM  Result Value Ref Range Status   Specimen Description BLOOD RIGHT ANTECUBITAL  Final   Special Requests   Final    BOTTLES DRAWN AEROBIC AND ANAEROBIC Blood Culture adequate volume   Culture NO GROWTH 2 DAYS  Final   Report Status PENDING  Incomplete  Blood Culture (routine x 2)     Status: None (Preliminary result)   Collection Time: 02/15/19 12:42 PM  Result Value Ref Range Status   Specimen Description BLOOD RIGHT HAND  Final   Special Requests   Final    BOTTLES DRAWN AEROBIC AND ANAEROBIC Blood Culture results may not be optimal due to an inadequate volume of blood received in culture bottles   Culture NO GROWTH 2 DAYS  Final   Report Status PENDING  Incomplete  MRSA PCR Screening     Status: Abnormal   Collection Time: 02/16/19  2:05 PM  Result Value Ref Range Status   MRSA by PCR POSITIVE (A) NEGATIVE Final      Comment:        The GeneXpert MRSA Assay (FDA approved for NASAL specimens only), is one component of a comprehensive MRSA colonization surveillance program. It is not intended to diagnose MRSA infection nor to guide or monitor treatment for MRSA infections. RESULT CALLED TO, READ BACK BY AND VERIFIED WITH: Jasper Riling RN 17:25 02/16/19 ([email or web address]) Performed at [facility], [place] [address] [place], [place] [address]          Radiology Studies: Dg Chest 1 View  Result Date: 02/15/2019 CLINICAL DATA:  Bleeding and drainage at femur amputation site. Possible infection. EXAM: CHEST  1 VIEW COMPARISON:  12/27/2018 FINDINGS: Grossly unchanged enlarged cardiac silhouette and mediastinal contours given supine positioning. Atherosclerotic plaque when the thoracic aorta. The pulmonary vasculature appears less distinct than present examination with cephalization of flow. There is mild elevation/eventration of right hemidiaphragm. No new discrete focal airspace opacities. No supine evidence pleural  effusion or pneumothorax. No acute osseous abnormalities. IMPRESSION: Cardiomegaly with suspected mild pulmonary edema on this AP supine portable examination. Further evaluation with a PA and lateral chest radiograph may be obtained as clinically indicated. Electronically Signed   By: Sandi Mariscal M.D.   On: 02/15/2019 13:46   Mr Frmur Right Wo Contrast  Result Date: 02/15/2019 CLINICAL DATA:  Right AKA stump infection. Recent bilateral AKAs on 12/29/2018. EXAM: MRI OF THE RIGHT FEMUR WITHOUT CONTRAST TECHNIQUE: Multiplanar, multisequence MR imaging of the right femur was performed. No intravenous contrast was administered. COMPARISON:  Right femur x-rays from same day. FINDINGS: Limited examination due to motion artifact and SAR restrictions given history of left SFA stent. Bones/Joint/Cartilage Prior bilateral above knee amputations. No suspicious marrow signal abnormality. Unchanged 1.8 cm  well-defined, T2 hyperintense lesion in the right femoral head without surrounding marrow edema. No fracture or dislocation. Normal alignment. No hip joint effusion. Muscles and Tendons Edema within the vastus lateralis and intermedius muscles. Soft tissue Ill-defined 1.7 x 4.2 x 4.2 cm complex fluid collection in the soft tissues of the right amputation stump. This appears to communicate with the skin surface. There is an additional 2.8 x 5.8 x 5.4 cm crescentic, complex fluid collection draped over the distal aspect of the residual femoral diaphysis, which may communicate with the other fluid collection. There is a small amount of soft tissue edema in the left amputation stump site without discrete fluid collection. IMPRESSION: 1. Complex fluid collections in the soft tissues of the right amputation stump and draped over the distal aspect of the residual femoral diaphysis, concerning for abscess. These may communicate. CT of the right thigh with contrast may be useful for further evaluation given limitations of this study as described above. 2. Prior bilateral above knee amputations. No evidence of osteomyelitis. 3. Nonspecific right vastus lateralis and intermedius myositis, potentially infectious in etiology. 4. Unchanged 1.8 cm non-aggressive appearing lesion in the right femoral head. This demonstrated no uptake on recent bone scan and may represent a geode or intraosseous ganglion cyst. Electronically Signed   By: Titus Dubin M.D.   On: 02/15/2019 17:03   Dg Femur Min 2 Views Right  Result Date: 02/15/2019 CLINICAL DATA:  Bleeding and drainage at site of amputation. Evaluate for infection. EXAM: RIGHT FEMUR 2 VIEWS COMPARISON:  None. FINDINGS: Post right above knee amputation. Scattered foci of subcutaneous emphysema about the distal aspect of the residual limb. No radiopaque foreign body. No discrete areas of osteolysis to suggest osteomyelitis. Vascular calcifications. Limited visualization of the  adjacent hip and lower pelvis is normal. IMPRESSION: 1. Scattered foci of subcutaneous emphysema about the distal aspect the residual limb, nonspecific though in the absence of recent intervention, a gas producing organism could have resulted in a similar appearance. Clinical correlation is advised. Further evaluation with MRI could be performed as indicated. 2. No radiographic evidence of osteomyelitis involving the residual femur. Electronically Signed   By: Sandi Mariscal M.D.   On: 02/15/2019 13:45        Scheduled Meds: . Chlorhexidine Gluconate Cloth  6 each Topical Q0600  . metoprolol tartrate  25 mg Oral BID  . midodrine  10 mg Oral Q M,W,F  . mupirocin ointment  1 application Nasal BID   Continuous Infusions: . piperacillin-tazobactam (ZOSYN)  IV 3.375 g (02/17/19 0200)  . vancomycin       LOS: 2 days     Cordelia Poche, MD [facility] 02/17/2019, 8:45 AM  If 7PM-7AM, please  contact night-coverage [email or web address]

## 2019-02-17 NOTE — Transfer of Care (Addendum)
Immediate Anesthesia Transfer of Care Note  Patient: [name]  Procedure(s) Performed: Right above the kneee debridement (Right Leg Upper) Application Of Wound Vac (Right Knee)  Patient Location: PACU  Anesthesia Type:General  Level of Consciousness: drowsy, patient cooperative and responds to stimulation  Airway & Oxygen Therapy: Patient Spontanous Breathing and Patient connected to face mask oxygen  Post-op Assessment: Report given to RN and Post -op Vital signs reviewed and stable  Post vital signs: Reviewed and stable  Last Vitals:  Vitals Value Taken Time  BP 157/95 02/17/2019 12:17 PM  Temp 36.5 C 02/17/2019 12:17 PM  Pulse 127 02/17/2019 12:21 PM  Resp 25 02/17/2019 12:21 PM  SpO2 100 % 02/17/2019 12:21 PM  Vitals shown include unvalidated device data.  Last Pain:  Vitals:   02/17/19 1217  TempSrc:   PainSc: (P) Asleep         Complications: No apparent anesthesia complications

## 2019-02-17 NOTE — Anesthesia Procedure Notes (Signed)
Procedure Name: Intubation Date/Time: 02/17/2019 11:21 AM Performed by: Jearld Pies, CRNA Pre-anesthesia Checklist: Patient identified, Emergency Drugs available, Suction available and Patient being monitored Patient Re-evaluated:Patient Re-evaluated prior to induction Oxygen Delivery Method: Circle System Utilized Preoxygenation: Pre-oxygenation with 100% oxygen Induction Type: IV induction Ventilation: Mask ventilation without difficulty and Oral airway inserted - appropriate to patient size Laryngoscope Size: Sabra Heck and 2 Grade View: Grade I Tube type: Oral Tube size: 7.5 mm Number of attempts: 1 Airway Equipment and Method: Stylet and Oral airway Placement Confirmation: ETT inserted through vocal cords under direct vision,  positive ETCO2 and breath sounds checked- equal and bilateral Secured at: 20 cm Tube secured with: Tape Dental Injury: Teeth and Oropharynx as per pre-operative assessment

## 2019-02-17 NOTE — Progress Notes (Signed)
Patient refuses CPAP, if they changed their mind, to just have the RN call Respiratory and we would come set them up.

## 2019-02-17 NOTE — Op Note (Signed)
Date: February 17, 2019  Preoperative diagnosis: Draining non-healing wound from right above-knee amputation  Postoperative diagnosis: Same  Procedure: 1.  Incision and drainage of right above-knee amputation 2.  Sharp excisional debridement (7 cm x 4 cm x 6 cm deep) 3.  Negative pressure wound VAC placement  Surgeon: Dr. Marty Heck, MD  Assistant: OR staff  Indications: Patient is a 78 year old male who previously underwent bilateral above-knee amputations by Dr. Donzetta Matters.  He was recently admitted with chronic draining wound from his right above-knee amputation stump.  He underwent MRI and there was concern for underlying fluid collection.  He presents today for I&D and possible debridement with VAC placement after risks and benefits are discussed with his daughter.  Findings: There were two draining sinuses one medial and the second in the mid portion of his previous right above knee amputation incision.  I reopened the incision between these two sinuses and there was an underlying cavity that communicated.  There was minimal purulence, gram stain and cultures were sent in the OR from the wound.  Most of this appeared to be underlying fat necrosis and I performed sharp excisional debridement.  I could probe distal femur in the bottom of the wound.  A negative pressure wound VAC was placed.  Anesthesia: LMA  Details: The patient was brought to the operating room and placed supine on the table.  After anesthesia was induced his right above-knee amputation was prepped and draped in the standard sterile fashion.  A prep timeout was performed to identify patient, procedure and site.  In evaluating the wound there was a draining sinus both on the medial portion of his old incision and a second midway across his previous above-knee amputation incision in the middle.  I used a 15 blade scalpel and ultimately reopened his old incision connected these two sinuses and opened the wound with Bovie  cautery.  Underneath this there was a larger cavity that communicated with both draining sinuses.  There was really minimal purulence and most of the wound appeared to have some fat necrosis is the soft tissue.  I did send several cultures from the wound bed for Gram stain and culture.  The wound was then debrided with sharp excisional debridement using Bovie cautery and blunt dissection.  I could probe femur in the bottom of the wound.  Ultimately the wound was then irrigated copiously until the effluent was clear.  All the remaining tissue appeared viable.  A medium black sponge was cut to fit the wound that measured 4 cm wide by 7 cm long and 6 cm deep.  Black sponge was secured and a track pad was placed and the vac was placed to -125 mm Hg suction.  Tolerated the procedure without any apparent complications.  Condition: Stable  Marty Heck, MD [facility] Office: [phone] Pager: [place]

## 2019-02-17 NOTE — Progress Notes (Addendum)
1025 Pt to short stay.  Kept pt NPO post midnight. CHG completed. Report was given to short stay. 1335 Received pt back from PACU, awake, a little confused. Right AKA with wound vac dressing dry and intact. Denies pain at this time. 1700 Pt is very sleepy, responsive to verbal stimuli.

## 2019-02-17 NOTE — Anesthesia Postprocedure Evaluation (Signed)
Anesthesia Post Note  Patient: [name]  Procedure(s) Performed: Right above the kneee debridement (Right Leg Upper) Application Of Wound Vac (Right Knee)     Patient location during evaluation: PACU Anesthesia Type: General Level of consciousness: sedated and patient cooperative Pain management: pain level controlled Vital Signs Assessment: post-procedure vital signs reviewed and stable Respiratory status: spontaneous breathing Cardiovascular status: stable Anesthetic complications: no    Last Vitals:  Vitals:   02/17/19 1302 02/17/19 1314  BP: (!) 147/90 (!) 144/94  Pulse: (!) 112 (!) 118  Resp: 20 20  Temp: 36.5 C   SpO2: 94% 94%    Last Pain:  Vitals:   02/17/19 1247  TempSrc:   PainSc: 0-No pain                 Nolon Nations

## 2019-02-17 NOTE — Progress Notes (Addendum)
[facility] Progress Note   Subjective: Just returned from PACU S/P I & D R AKA. Pulled out IV, oriented to person only. No C/Os.   Objective Vitals:   02/17/19 1247 02/17/19 1302 02/17/19 1314 02/17/19 1336  BP: (!) 148/82 (!) 147/90 (!) 144/94 138/88  Pulse: (!) 113 (!) 112 (!) 118 (!) 115  Resp: 20 20 20    Temp:  97.7 F (36.5 C)  97.7 F (36.5 C)  TempSrc:    Oral  SpO2: 99% 94% 94% 94%  Weight:      Height:       Physical Exam General: Chronically ill appearing elderly male in NAD Heart: regularly irregulat 2/6 systolic M.  Lungs: CTAB slightly decreased in bases.  Abdomen: soft, NT Extremities: Bilateral AKAs R AKA with wound vac, L AKA no stump edema Dialysis Access: LUA AVF+ bruit    Additional Objective Labs: Basic Metabolic Panel: Recent Labs  Lab 02/15/19 1242 02/15/19 2242 02/16/19 0029  NA 142 141 143  K 3.9 4.8 3.8  CL 102 102 103  CO2 26 24 26   GLUCOSE 130* 106* 104*  BUN 51* 58* 56*  CREATININE 6.40* 6.50* 6.64*  CALCIUM 9.3 9.0 9.1  PHOS  --  4.0  --    Liver Function Tests: Recent Labs  Lab 02/15/19 1242 02/15/19 2242  AST 30  --   ALT 25  --   ALKPHOS 198*  --   BILITOT 0.9  --   PROT 7.0  --   ALBUMIN 2.2* 2.1*   No results for input(s): LIPASE, AMYLASE in the last 168 hours. CBC: Recent Labs  Lab 02/15/19 1242 02/15/19 2242 02/16/19 0029  WBC 14.7* 12.4* 11.7*  NEUTROABS 11.8*  --   --   HGB 10.1* 10.4* 10.2*  HCT 35.1* 34.9* 34.9*  MCV 90.5 88.4 89.7  PLT 227 209 215   Blood Culture    Component Value Date/Time   SDES BLOOD RIGHT HAND 02/15/2019 1242   SPECREQUEST  02/15/2019 1242    BOTTLES DRAWN AEROBIC AND ANAEROBIC Blood Culture results may not be optimal due to an inadequate volume of blood received in culture bottles   CULT NO GROWTH 2 DAYS 02/15/2019 1242   REPTSTATUS PENDING 02/15/2019 1242    Cardiac Enzymes: No results for input(s): CKTOTAL, CKMB, CKMBINDEX, TROPONINI in the last 168  hours. CBG: Recent Labs  Lab 02/16/19 1212 02/16/19 1619 02/16/19 2216 02/17/19 0551 02/17/19 1218  GLUCAP 122* 137* 121* 105* 107*   Iron Studies: No results for input(s): IRON, TIBC, TRANSFERRIN, FERRITIN in the last 72 hours. @lablastinr3 @ Studies/Results: Mr Frmur Right Wo Contrast  Result Date: 02/15/2019 CLINICAL DATA:  Right AKA stump infection. Recent bilateral AKAs on 12/29/2018. EXAM: MRI OF THE RIGHT FEMUR WITHOUT CONTRAST TECHNIQUE: Multiplanar, multisequence MR imaging of the right femur was performed. No intravenous contrast was administered. COMPARISON:  Right femur x-rays from same day. FINDINGS: Limited examination due to motion artifact and SAR restrictions given history of left SFA stent. Bones/Joint/Cartilage Prior bilateral above knee amputations. No suspicious marrow signal abnormality. Unchanged 1.8 cm well-defined, T2 hyperintense lesion in the right femoral head without surrounding marrow edema. No fracture or dislocation. Normal alignment. No hip joint effusion. Muscles and Tendons Edema within the vastus lateralis and intermedius muscles. Soft tissue Ill-defined 1.7 x 4.2 x 4.2 cm complex fluid collection in the soft tissues of the right amputation stump. This appears to communicate with the skin surface. There is an additional 2.8 x 5.8  x 5.4 cm crescentic, complex fluid collection draped over the distal aspect of the residual femoral diaphysis, which may communicate with the other fluid collection. There is a small amount of soft tissue edema in the left amputation stump site without discrete fluid collection. IMPRESSION: 1. Complex fluid collections in the soft tissues of the right amputation stump and draped over the distal aspect of the residual femoral diaphysis, concerning for abscess. These may communicate. CT of the right thigh with contrast may be useful for further evaluation given limitations of this study as described above. 2. Prior bilateral above knee  amputations. No evidence of osteomyelitis. 3. Nonspecific right vastus lateralis and intermedius myositis, potentially infectious in etiology. 4. Unchanged 1.8 cm non-aggressive appearing lesion in the right femoral head. This demonstrated no uptake on recent bone scan and may represent a geode or intraosseous ganglion cyst. Electronically Signed   By: Titus Dubin M.D.   On: 02/15/2019 17:03   Medications: . sodium chloride    . piperacillin-tazobactam (ZOSYN)  IV 3.375 g (02/17/19 0200)  . vancomycin    . vancomycin     . Chlorhexidine Gluconate Cloth  6 each Topical Q0600  . metoprolol tartrate  25 mg Oral BID  . midodrine  10 mg Oral Q M,W,F  . mupirocin ointment  1 application Nasal BID   HD orders: [facility] MWF 4 hrs 180NRe 400/Autoflow 1.5 56.5 kg 3.0 K/ 2.25 Ca UFP 2 linear Na L AVF -No heparin -Mircera 225 mcg IV q 2 weeks (last dose 02/08/19 Last HGB  9.5 02/08/19)   Assessment/Plan: 1.  R stump infection s/p AKA: To OR for I & D R AKA with wound vac placement. BC NG 2 days. Continue Vanc/Zosyn. WBC down to 11.7.  2.  ESRD:  MWF-HD today on schedule. No heparin.   3.  Hypertension/volume: No overtly volume overloaded. Has been leaving under EDW at [place]. Chest x-ray shows mild pulmonary edema. Lower volume as tolerated.  4.  Anemia: Hgb 10.1. Received max Mircera as outpatient on 02/08/2019. Recently completed Venofer bolus.  5.  Metabolic bone disease: Ca 9.1 C Ca 10.6 No binders, VDRA.  6.  Nutrition:  Albumin 2.2. Continue pro-stat, renal vits.  7.  Paroxysmal atrial fibrillation: On metoprolol BID, does not take before dialysis. Continue coumadin per primary team.  8.    Dementia: Cooperative today. Requires sitter in OP center D/T pulling needles.   Rita H. Brown NP-C 02/17/2019, 1:59 PM  [facility] [phone]  Pt seen, examined and agree w A/P as above.  [facility] 02/17/2019, 2:56 PM

## 2019-02-17 NOTE — Progress Notes (Signed)
  Progress Note    02/17/2019 10:10 AM Day of Surgery  Subjective:  No overnight issues  Vitals:   02/17/19 0354 02/17/19 0927  BP: 125/76 123/63  Pulse: (!) 105 100  Resp: 18 17  Temp: 100.1 F (37.8 C) 97.7 F (36.5 C)  SpO2: 100% 95%    Physical Exam: Awake and alert Right aka site with purulent drainage  CBC    Component Value Date/Time   WBC 11.7 (H) 02/16/2019 0029   RBC 3.89 (L) 02/16/2019 0029   HGB 10.2 (L) 02/16/2019 0029   HCT 34.9 (L) 02/16/2019 0029   HCT 27.1 (L) 11/09/2018 0230   PLT 215 02/16/2019 0029   MCV 89.7 02/16/2019 0029   MCH 26.2 02/16/2019 0029   MCHC 29.2 (L) 02/16/2019 0029   RDW 16.5 (H) 02/16/2019 0029   LYMPHSABS 1.2 02/15/2019 1242   MONOABS 1.3 (H) 02/15/2019 1242   EOSABS 0.2 02/15/2019 1242   BASOSABS 0.1 02/15/2019 1242    BMET    Component Value Date/Time   NA 143 02/16/2019 0029   NA 146 01/31/2019   K 3.8 02/16/2019 0029   CL 103 02/16/2019 0029   CO2 26 02/16/2019 0029   GLUCOSE 104 (H) 02/16/2019 0029   BUN 56 (H) 02/16/2019 0029   BUN 21 01/31/2019   CREATININE 6.64 (H) 02/16/2019 0029   CALCIUM 9.1 02/16/2019 0029   CALCIUM 8.3 (L) 10/25/2008 1450   GFRNONAA 7 (L) 02/16/2019 0029   GFRAA 8 (L) 02/16/2019 0029    INR    Component Value Date/Time   INR 2.06 02/17/2019 0337     Intake/Output Summary (Last 24 hours) at 02/17/2019 1010 Last data filed at 02/17/2019 0900 Gross per 24 hour  Intake 240 ml  Output 1600 ml  Net -1360 ml     Assessment:  78 y.o. male is s/p bilateral aka, having purulent drainage from right aka site   Plan: OR today for debridement of right aka wound   Brandon C. Donzetta Matters, MD [facility] Office: [phone] Pager: [phone]  02/17/2019 10:10 AM

## 2019-02-17 NOTE — Anesthesia Preprocedure Evaluation (Addendum)
Anesthesia Evaluation  Patient identified by MRN, date of birth, ID band Patient confused    Reviewed: Allergy & Precautions, NPO status , Patient's Chart, lab work & pertinent test results, reviewed documented beta blocker date and time   History of Anesthesia Complications Negative for: history of anesthetic complications  Airway Mallampati: II  TM Distance: >3 FB Neck ROM: Full    Dental  (+) Poor Dentition, Missing Multiple missing teeth and overall poor dentition; per patient no loose teeth:   Pulmonary sleep apnea and Continuous Positive Airway Pressure Ventilation , pneumonia, former smoker,    breath sounds clear to auscultation       Cardiovascular hypertension, Pt. on medications and Pt. 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), OSA on CPAP, HTN, A fib, moderate aortic stenosis, right HF/pulm HTN, PVD s/p 5th ray amp, GERD, ESRD on HD, DM  - admitted 12/23/18 with encephalopathy & acute respiratory failure 2/2 multifocal pneumonia and concern for sepsis; currently on vanc/cefepime, back to room air, encephalopathy improved  - TTE 11/08/18: EF 60-65%, mod-severe RV systolic dysfunction, moderate AS (valve area 0.9 cm2), mild-mod TR, PASP 67 mmHg; aortic valve gradients may be underestimated - K 3.7, Hgb 10, INR 2.1  Reproductive/Obstetrics                             Anesthesia Physical  Anesthesia Plan  ASA:  IV  Anesthesia Plan: General   Post-op Pain Management:    Induction: Intravenous  PONV Risk Score and Plan: 2 and Ondansetron, Dexamethasone and Treatment may vary due to age or medical condition  Airway Management Planned: Oral ETT  Additional Equipment: Arterial line  Intra-op Plan:   Post-operative Plan: Extubation in OR  Informed Consent: I have reviewed the patients History and Physical, chart, labs and discussed the procedure including the risks, benefits and alternatives for the proposed anesthesia with the patient or authorized representative who has indicated his/her understanding and acceptance.     Dental advisory given  Plan Discussed with: CRNA  Anesthesia Plan Comments:        Anesthesia Quick Evaluation

## 2019-02-18 ENCOUNTER — Encounter (HOSPITAL_COMMUNITY): Payer: Self-pay | Admitting: Vascular Surgery

## 2019-02-18 LAB — GLUCOSE, CAPILLARY
GLUCOSE-CAPILLARY: 100 mg/dL — AB (ref 70–99)
Glucose-Capillary: 109 mg/dL — ABNORMAL HIGH (ref 70–99)
Glucose-Capillary: 171 mg/dL — ABNORMAL HIGH (ref 70–99)
Glucose-Capillary: 144 mg/dL — ABNORMAL HIGH (ref 70–99)
Glucose-Capillary: 137 mg/dL — ABNORMAL HIGH (ref 70–99)

## 2019-02-18 LAB — PROTIME-INR
INR: 1.9
Prothrombin Time: 21.5 seconds — ABNORMAL HIGH (ref 11.4–15.2)

## 2019-02-18 MED ORDER — MIDODRINE HCL 5 MG PO TABS
ORAL_TABLET | ORAL | Status: AC
  Administered 2019-02-18: 10 mg via ORAL
  Filled 2019-02-18: qty 2

## 2019-02-18 MED ORDER — LIDOCAINE HCL (PF) 1 % IJ SOLN: 5.0000 mL | INTRAMUSCULAR | Status: DC | PRN

## 2019-02-18 MED ORDER — LIDOCAINE-PRILOCAINE 2.5-2.5 % EX CREA: 1.0000 "application " | TOPICAL_CREAM | CUTANEOUS | Status: DC | PRN

## 2019-02-18 MED ORDER — HEPARIN SODIUM (PORCINE) 1000 UNIT/ML DIALYSIS: 1000.0000 [IU] | INTRAMUSCULAR | Status: DC | PRN

## 2019-02-18 MED ORDER — SODIUM CHLORIDE 0.9 % IV SOLN: 100.0000 mL | INTRAVENOUS | Status: DC | PRN

## 2019-02-18 MED ORDER — WARFARIN - PHARMACIST DOSING INPATIENT
Freq: Every day | Status: DC
  Administered 2019-02-18: 18:00:00

## 2019-02-18 MED ORDER — GERHARDT'S BUTT CREAM
TOPICAL_CREAM | Freq: Three times a day (TID) | CUTANEOUS | Status: DC
  Administered 2019-02-18: 1 via TOPICAL
  Administered 2019-02-18: 14:00:00 via TOPICAL
  Administered 2019-02-19: 1 via TOPICAL
  Administered 2019-02-19 – 2019-02-20 (×3): via TOPICAL
  Administered 2019-02-20: 1 via TOPICAL
  Administered 2019-02-20 – 2019-02-21 (×2): via TOPICAL
  Filled 2019-02-18: qty 1

## 2019-02-18 MED ORDER — WARFARIN SODIUM 2.5 MG PO TABS
2.5000 mg | ORAL_TABLET | Freq: Once | ORAL | Status: AC
  Administered 2019-02-18: 2.5 mg via ORAL
  Filled 2019-02-18: qty 1

## 2019-02-18 MED ORDER — ALTEPLASE 2 MG IJ SOLR: 2.0000 mg | Freq: Once | INTRAMUSCULAR | Status: DC | PRN

## 2019-02-18 MED ORDER — LOPERAMIDE HCL 2 MG PO CAPS
2.0000 mg | ORAL_CAPSULE | ORAL | Status: DC | PRN
  Administered 2019-02-18 – 2019-02-19 (×2): 2 mg via ORAL
  Filled 2019-02-18 (×2): qty 1

## 2019-02-18 MED ORDER — MIDODRINE HCL 5 MG PO TABS
10.0000 mg | ORAL_TABLET | Freq: Once | ORAL | Status: AC
  Administered 2019-02-18: 10 mg via ORAL

## 2019-02-18 MED ORDER — PENTAFLUOROPROP-TETRAFLUOROETH EX AERO: 1.0000 "application " | INHALATION_SPRAY | CUTANEOUS | Status: DC | PRN

## 2019-02-18 NOTE — Progress Notes (Signed)
Called to notify Central Telemetry that patient is leaving the floor for dialysis.

## 2019-02-18 NOTE — Progress Notes (Signed)
ANTICOAGULATION CONSULT NOTE - Follow Up Consult  Pharmacy Consult for Warfarin Indication: atrial fibrillation  Allergies  Allergen Reactions  . Occlusive Silicone Sheets [Silicone] Other (See Comments)    Unknown reaction (listed as 'occlusive adhesive' on [facility] 02/15/2019)  . Other Other (See Comments)    Unknown reaction to Occlusive adhesive  . Tape Itching    Use Cloth tape only    Patient Measurements: Height: 5\' 6"  (167.6 cm) Weight: 117 lb 8.1 oz (53.3 kg) IBW/kg (Calculated) : 63.8  Vital Signs: Temp: 97.8 F (36.6 C) (02/22 0821) Temp Source: Oral (02/22 0821) BP: 99/72 (02/22 0821) Pulse Rate: 101 (02/22 0821)  Labs: Recent Labs    02/15/19 2242 02/16/19 0029 02/16/19 1257 02/17/19 0337 02/17/19 1456 02/18/19 0605  HGB 10.4* 10.2*  --   --  11.2*  --   HCT 34.9* 34.9*  --   --  37.9*  --   PLT 209 215  --   --  229  --   LABPROT  --   --  22.3* 23.0*  --  21.5*  INR  --   --  1.99 2.06  --  1.90  CREATININE 6.50* 6.64*  --   --  4.46*  --     Estimated Creatinine Clearance: 10.3 mL/min (A) (by C-G formula based on SCr of 4.46 mg/dL (H)).  Assessment: 78 year old male on warfarin prior to admission to resume today for Afib INR = 1.9 today Dose prior to admission was 2 mg po daily  Antibiotics continue for right stump wound with VAC placed yesterday  Goal of Therapy:  INR 2-3 Monitor platelets by anticoagulation protocol: Yes   Plan:  Warfarin 2.5 mg po x 1 dose tonight Daily INR Watch HD schedule  Thank you Anette Guarneri, PharmD [phone]  02/18/2019,11:14 AM

## 2019-02-18 NOTE — Progress Notes (Signed)
   VASCULAR SURGERY ASSESSMENT & PLAN:   1 Day Post-Op s/p: Incision and drainage of right above-the-knee amputation.  Placement of VAC.  The VAC has a good seal.  This will be changed on Monday.  His left AKA is healed.  SUBJECTIVE:   No complaints.  PHYSICAL EXAM:   Vitals:   02/17/19 1650 02/17/19 1935 02/18/19 0054 02/18/19 0544  BP: (!) 143/77 120/85 123/71 110/70  Pulse: (!) 101 (!) 113 97 (!) 103  Resp: 16 14 14    Temp: 97.7 F (36.5 C) 97.6 F (36.4 C) 98.2 F (36.8 C) (!) 97.5 F (36.4 C)  TempSrc: Oral Oral Oral Oral  SpO2: 99% 100% 99% 99%  Weight:      Height:       VAC on the right side has a good seal. The left AKA is healed.  LABS:   Lab Results  Component Value Date   WBC 10.1 02/17/2019   HGB 11.2 (L) 02/17/2019   HCT 37.9 (L) 02/17/2019   MCV 87.5 02/17/2019   PLT 229 02/17/2019   Lab Results  Component Value Date   CREATININE 4.46 (H) 02/17/2019   Lab Results  Component Value Date   INR 1.90 02/18/2019   CBG (last 3)  Recent Labs    02/17/19 2117 02/18/19 0604 02/18/19 0654  GLUCAP 126* 109* 100*    PROBLEM LIST:    Principal Problem:   Amputation stump infection ([place]) Active Problems:   DM (diabetes mellitus) ([place])   PAF (paroxysmal atrial fibrillation) ([facility])   ESRD (end stage renal disease) ([place])   Sepsis ([place])   HTN (hypertension)   CURRENT MEDS:   . Chlorhexidine Gluconate Cloth  6 each Topical Q0600  . metoprolol tartrate  25 mg Oral BID  . midodrine  10 mg Oral Q M,W,F  . mupirocin ointment  1 application Nasal BID    Deitra Mayo Beeper: [phone] Office: [phone] 02/18/2019

## 2019-02-18 NOTE — Progress Notes (Signed)
Dialysis report given to RN Adele Schilder. Patient ready for transfer.

## 2019-02-18 NOTE — Progress Notes (Signed)
PROGRESS NOTE    [name]  DJS:[id number] DOB: [birth year] DOA: 02/15/2019 PCP: Hennie Duos, MD   Brief Narrative: [name] is a 78 y.o. 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 Patient here with sepsis secondary to a right stump infection. Currently on antibiotics with plans for debridement.   Assessment & Plan:   Principal Problem:   Amputation stump infection ([place]) Active Problems:   DM (diabetes mellitus) ([place])   PAF (paroxysmal atrial fibrillation) ([place])   ESRD (end stage renal disease) ([place])   Sepsis ([place])   HTN (hypertension)   Right stump infection Associated abscess. Debridement performed on 2/21. Wound vac applied. -Continue Vancomycin IV/Zosyn (wound culture pending) -Vascular surgery recommendations: s/p debridement and wound vac  Sepsis Secondary to stump infection. Physiology resolved.  ESRD on HD Nephrology consulted. Per nephrology.  Chronic anemia Stable.  Essential hypertension Stable. Gets midodrine on HD days -Continue metoprolol.   Diabetes mellitus, type 2 Diet controlled. Hemoglobin A1C of 4.9%.  Paroxysmal atrial fibrillation On coumadin as an outpatient. Currently held for possible procedure. Rate controlled. -Heparin for INR <2 -Continue metoprolol  Dementia Stable  OSA -CPAP qhs  Pressure injury Right buttock. POA.  Diarrhea No associated abdominal pain. -Imodium prn   DVT prophylaxis: Coumadin Code Status:   Code Status: DNR Family Communication: None at bedside Disposition Plan: Discharge pending vascular recommendations   Consultants:   Vascular surgery  Procedures:   None  Antimicrobials:  Vancomycin  Zosyn    Subjective: Diarrhea today. Buttock pain.  Objective: Vitals:   02/17/19 1935 02/18/19 0054 02/18/19  0544 02/18/19 0821  BP: 120/85 123/71 110/70 99/72  Pulse: (!) 113 97 (!) 103 (!) 101  Resp: 14 14  16   Temp: 97.6 F (36.4 C) 98.2 F (36.8 C) (!) 97.5 F (36.4 C) 97.8 F (36.6 C)  TempSrc: Oral Oral Oral Oral  SpO2: 100% 99% 99% 100%  Weight:      Height:        Intake/Output Summary (Last 24 hours) at 02/18/2019 0956 Last data filed at 02/18/2019 0900 Gross per 24 hour  Intake 660 ml  Output 45 ml  Net 615 ml   Filed Weights   02/16/19 0719 02/16/19 1127 02/17/19 1111  Weight: 54.8 kg 53.3 kg 53.3 kg    Examination:  General exam: Appears calm and comfortable Respiratory system: Clear to auscultation. Respiratory effort normal. Cardiovascular system: S1 & S2 heard, RRR. 2/6 systolic murmur Gastrointestinal system: Abdomen is nondistended, soft and nontender. No organomegaly or masses felt. Normal bowel sounds heard. Central nervous system: Alert and oriented. No focal neurological deficits. Extremities: Bilateral AKA, right stump with no erythema around wound and wound vac dressing intact Skin: No cyanosis. No rashes Psychiatry: Judgement and insight appear normal. Mood & affect appropriate.     Data Reviewed: I have personally reviewed following labs and imaging studies  CBC: Recent Labs  Lab 02/15/19 1242 02/15/19 2242 02/16/19 0029 02/17/19 1456  WBC 14.7* 12.4* 11.7* 10.1  NEUTROABS 11.8*  --   --   --   HGB 10.1* 10.4* 10.2* 11.2*  HCT 35.1* 34.9* 34.9* 37.9*  MCV 90.5 88.4 89.7 87.5  PLT 227 209 215 885   Basic Metabolic Panel: Recent Labs  Lab 02/15/19 1242 02/15/19 2242 02/16/19 0029 02/17/19 1456  NA 142 141 143 139  K  3.9 4.8 3.8 4.0  CL 102 102 103 101  CO2 26 24 26 26   GLUCOSE 130* 106* 104* 142*  BUN 51* 58* 56* 23  CREATININE 6.40* 6.50* 6.64* 4.46*  CALCIUM 9.3 9.0 9.1 9.0  PHOS  --  4.0  --   --    GFR: Estimated Creatinine Clearance: 10.3 mL/min (A) (by C-G formula based on SCr of 4.46 mg/dL (H)). Liver Function  Tests: Recent Labs  Lab 02/15/19 1242 02/15/19 2242  AST 30  --   ALT 25  --   ALKPHOS 198*  --   BILITOT 0.9  --   PROT 7.0  --   ALBUMIN 2.2* 2.1*   No results for input(s): LIPASE, AMYLASE in the last 168 hours. No results for input(s): AMMONIA in the last 168 hours. Coagulation Profile: Recent Labs  Lab 02/15/19 1242 02/16/19 1257 02/17/19 0337 02/18/19 0605  INR 2.07 1.99 2.06 1.90   Cardiac Enzymes: No results for input(s): CKTOTAL, CKMB, CKMBINDEX, TROPONINI in the last 168 hours. BNP (last 3 results) No results for input(s): PROBNP in the last 8760 hours. HbA1C: Recent Labs    02/16/19 0410  HGBA1C 4.9   CBG: Recent Labs  Lab 02/17/19 1449 02/17/19 1646 02/17/19 2117 02/18/19 0604 02/18/19 0654  GLUCAP 129* 143* 126* 109* 100*   Lipid Profile: No results for input(s): CHOL, HDL, LDLCALC, TRIG, CHOLHDL, LDLDIRECT in the last 72 hours. Thyroid Function Tests: No results for input(s): TSH, T4TOTAL, FREET4, T3FREE, THYROIDAB in the last 72 hours. Anemia Panel: No results for input(s): VITAMINB12, FOLATE, FERRITIN, TIBC, IRON, RETICCTPCT in the last 72 hours. Sepsis Labs: Recent Labs  Lab 02/15/19 1242 02/15/19 2242 02/16/19 0029  LATICACIDVEN 2.0* 1.8 1.8    Recent Results (from the past 240 hour(s))  Blood Culture (routine x 2)     Status: None (Preliminary result)   Collection Time: 02/15/19 12:39 PM  Result Value Ref Range Status   Specimen Description BLOOD RIGHT ANTECUBITAL  Final   Special Requests   Final    BOTTLES DRAWN AEROBIC AND ANAEROBIC Blood Culture adequate volume   Culture NO GROWTH 2 DAYS  Final   Report Status PENDING  Incomplete  Blood Culture (routine x 2)     Status: None (Preliminary result)   Collection Time: 02/15/19 12:42 PM  Result Value Ref Range Status   Specimen Description BLOOD RIGHT HAND  Final   Special Requests   Final    BOTTLES DRAWN AEROBIC AND ANAEROBIC Blood Culture results may not be optimal due to an  inadequate volume of blood received in culture bottles   Culture NO GROWTH 2 DAYS  Final   Report Status PENDING  Incomplete  MRSA PCR Screening     Status: Abnormal   Collection Time: 02/16/19  2:05 PM  Result Value Ref Range Status   MRSA by PCR POSITIVE (A) NEGATIVE Final    Comment:        The GeneXpert MRSA Assay (FDA approved for NASAL specimens only), is one component of a comprehensive MRSA colonization surveillance program. It is not intended to diagnose MRSA infection nor to guide or monitor treatment for MRSA infections. RESULT CALLED TO, READ BACK BY AND VERIFIED WITH: Jasper Riling RN 17:25 02/16/19 ([email or web address]) Performed at [facility], [place] [address] [place],  [address]   Aerobic/Anaerobic Culture (surgical/deep wound)     Status: None (Preliminary result)   Collection Time: 02/17/19 11:45 AM  Result Value Ref Range Status  Specimen Description WOUND  Final   Special Requests RIGHT KNEE  Final   Gram Stain   Final    FEW WBC PRESENT, PREDOMINANTLY PMN RARE GRAM POSITIVE COCCI Performed at [facility], [place] [address], [place],  [address]    Culture PENDING  Incomplete   Report Status PENDING  Incomplete         Radiology Studies: No results found.      Scheduled Meds: . Chlorhexidine Gluconate Cloth  6 each Topical Q0600  . metoprolol tartrate  25 mg Oral BID  . midodrine  10 mg Oral Q M,W,F  . mupirocin ointment  1 application Nasal BID   Continuous Infusions: . sodium chloride    . piperacillin-tazobactam (ZOSYN)  IV 3.375 g (02/18/19 0300)  . vancomycin       LOS: 3 days     Cordelia Poche, MD [facility] 02/18/2019, 9:56 AM  If 7PM-7AM, please contact night-coverage [email or web address]

## 2019-02-18 NOTE — Progress Notes (Signed)
[facility] Progress Note   Subjective: seen in his room  Objective Vitals:   02/17/19 1935 02/18/19 0054 02/18/19 0544 02/18/19 0821  BP: 120/85 123/71 110/70 99/72  Pulse: (!) 113 97 (!) 103 (!) 101  Resp: 14 14  16   Temp: 97.6 F (36.4 C) 98.2 F (36.8 C) (!) 97.5 F (36.4 C) 97.8 F (36.6 C)  TempSrc: Oral Oral Oral Oral  SpO2: 100% 99% 99% 100%  Weight:      Height:       Physical Exam General: Chronically ill appearing elderly male in NAD, oriented to place not date/ year Heart: regularly irregular 2/6 systolic M.  Lungs: CTAB slightly decreased in bases.  Abdomen: soft, NT Extremities: Bilateral AKAs R AKA with wound vac, L AKA no stump edema Dialysis Access: LUA AVF+ bruit    Additional Objective Labs: Basic Metabolic Panel: Recent Labs  Lab 02/15/19 2242 02/16/19 0029 02/17/19 1456  NA 141 143 139  K 4.8 3.8 4.0  CL 102 103 101  CO2 24 26 26   GLUCOSE 106* 104* 142*  BUN 58* 56* 23  CREATININE 6.50* 6.64* 4.46*  CALCIUM 9.0 9.1 9.0  PHOS 4.0  --   --    Liver Function Tests: Recent Labs  Lab 02/15/19 1242 02/15/19 2242  AST 30  --   ALT 25  --   ALKPHOS 198*  --   BILITOT 0.9  --   PROT 7.0  --   ALBUMIN 2.2* 2.1*   No results for input(s): LIPASE, AMYLASE in the last 168 hours. CBC: Recent Labs  Lab 02/15/19 1242 02/15/19 2242 02/16/19 0029 02/17/19 1456  WBC 14.7* 12.4* 11.7* 10.1  NEUTROABS 11.8*  --   --   --   HGB 10.1* 10.4* 10.2* 11.2*  HCT 35.1* 34.9* 34.9* 37.9*  MCV 90.5 88.4 89.7 87.5  PLT 227 209 215 229   Blood Culture    Component Value Date/Time   SDES WOUND 02/17/2019 1145   SPECREQUEST RIGHT KNEE 02/17/2019 1145   CULT FEW STAPHYLOCOCCUS AUREUS 02/17/2019 1145   REPTSTATUS PENDING 02/17/2019 1145    Cardiac Enzymes: No results for input(s): CKTOTAL, CKMB, CKMBINDEX, TROPONINI in the last 168 hours. CBG: Recent Labs  Lab 02/17/19 1646 02/17/19 2117 02/18/19 0604 02/18/19 0654  02/18/19 1146  GLUCAP 143* 126* 109* 100* 171*   Iron Studies: No results for input(s): IRON, TIBC, TRANSFERRIN, FERRITIN in the last 72 hours. @lablastinr3 @ Studies/Results: No results found. Medications: . sodium chloride    . piperacillin-tazobactam (ZOSYN)  IV 3.375 g (02/18/19 0300)   . Chlorhexidine Gluconate Cloth  6 each Topical Q0600  . Gerhardt's butt cream   Topical TID  . metoprolol tartrate  25 mg Oral BID  . midodrine  10 mg Oral Q M,W,F  . mupirocin ointment  1 application Nasal BID  . warfarin  2.5 mg Oral ONCE-1800  . Warfarin - Pharmacist Dosing Inpatient   Does not apply q1800   HD orders: [facility] MWF 4 hrs 180NRe 400/Autoflow 1.5 56.5 kg 3.0 K/ 2.25 Ca UFP 2 linear Na L AVF -No heparin -Mircera 225 mcg IV q 2 weeks (last dose 02/08/19 Last HGB  9.5 02/08/19)   Assessment/Plan: 1.  R stump infection sp I&D 2/21 R AKA with wound vac placement. BC NG 2 days. Getting IV abx 2.  ESRD:  MWF HD. Last HD Thursday off sched, did not have orders for yest so will plan HD today. No heparin.   3.  Hypertension/volume: No vol excess, under dry wt 4.  Anemia: Hgb 10.1. Received max Mircera as outpatient on 02/08/2019. Recently completed Venofer bolus.  5.  Metabolic bone disease: Ca 9.1 C Ca 10.6 No binders, VDRA.  6.  Nutrition:  Albumin 2.2. Continue pro-stat, renal vits.  7.  Paroxysmal atrial fibrillation: On metoprolol BID, does not take before dialysis. Continue coumadin per primary team.  8.    Dementia: Cooperative today. Requires sitter in OP center D/T pulling needles.   [facility] 02/18/2019, 12:46 PM

## 2019-02-18 NOTE — Progress Notes (Deleted)
[facility] Progress Note   Subjective:Oriented to person and place today. Says he feels "so,so". Appears calm, NAD.   Objective Vitals:   02/17/19 1935 02/18/19 0054 02/18/19 0544 02/18/19 0821  BP: 120/85 123/71 110/70 99/72  Pulse: (!) 113 97 (!) 103 (!) 101  Resp: 14 14  16   Temp: 97.6 F (36.4 C) 98.2 F (36.8 C) (!) 97.5 F (36.4 C) 97.8 F (36.6 C)  TempSrc: Oral Oral Oral Oral  SpO2: 100% 99% 99% 100%  Weight:      Height:       Physical Exam General: Chronically ill appearing elderly male in NAD Heart: regularly irregulat 2/6 systolic M.  Lungs: CTAB slightly decreased in bases.  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 CBC: Recent Labs  Lab 02/15/19 1242 02/15/19 2242 02/16/19 0029 02/17/19 1456  WBC 14.7* 12.4* 11.7* 10.1  NEUTROABS 11.8*  --   --   --   HGB 10.1* 10.4* 10.2* 11.2*  HCT 35.1* 34.9* 34.9* 37.9*  MCV 90.5 88.4 89.7 87.5  PLT 227 209 215 229   Blood Culture    Component Value Date/Time   SDES WOUND 02/17/2019 1145   SPECREQUEST RIGHT KNEE 02/17/2019 1145   CULT FEW STAPHYLOCOCCUS AUREUS 02/17/2019 1145   REPTSTATUS PENDING 02/17/2019 1145    Cardiac Enzymes: No results for input(s): CKTOTAL, CKMB, CKMBINDEX, TROPONINI in the last 168 hours. CBG: Recent Labs  Lab 02/17/19 1646 02/17/19 2117  02/18/19 0604 02/18/19 0654 02/18/19 1146  GLUCAP 143* 126* 109* 100* 171*   Iron Studies: No results for input(s): IRON, TIBC, TRANSFERRIN, FERRITIN in the last 72 hours. @lablastinr3 @ Studies/Results: No results found. Medications: . sodium chloride    . piperacillin-tazobactam (ZOSYN)  IV 3.375 g (02/18/19 0300)   . Chlorhexidine Gluconate Cloth  6 each Topical Q0600  . Gerhardt's butt cream   Topical TID  . metoprolol tartrate  25 mg Oral BID  . midodrine  10 mg Oral Q M,W,F  . mupirocin ointment  1 application Nasal BID  . warfarin  2.5 mg Oral ONCE-1800  . Warfarin - Pharmacist Dosing Inpatient   Does not apply q1800     HD orders: [facility] MWF 4 hrs 180NRe 400/Autoflow 1.5 56.5 kg 3.0 K/ 2.25 Ca UFP 2 linear Na L AVF -No heparin -Mircera 225 mcg IV q 2 weeks (last dose 02/08/19 Last HGB  9.5 02/08/19)   Assessment/Plan: 1. Rstump infection s/p AKA:To OR for I & D R AKA with wound vac placement. BC NG 2 days. Continue Vanc/Zosyn. WBC down to 10.1. Afebrile overnight.  2. ESRD:MWF-HD today off schedule. No heparin.  3. Hypertension/volume:Has been leaving under EDW at  OP center. CXR showed mild pulmonary edema 02/19 but does not appear volume overloaded by exam today. UF 2 liters today.  4. Anemia:Hgb 11.2. Received max Mircera as outpatient on 02/08/2019. Recently completed Venofer bolus. 5. Metabolic bone disease:Ca 9.1 C Ca 10.6 No binders, VDRA.  6. Nutrition:Albumin 2.2. Continue pro-stat, renal vits.  7. Paroxysmal atrial fibrillation: On metoprolol BID, Continue coumadin per primary team.  8.    Dementia: Cooperative today. Requires sitter in OP center D/T pulling needles.   Daegen Berrocal H. Jahnessa Vanduyn NP-C 02/18/2019, 12:35 PM  [employer] [phone]

## 2019-02-19 LAB — PROTIME-INR
INR: 1.86
Prothrombin Time: 21.2 seconds — ABNORMAL HIGH (ref 11.4–15.2)

## 2019-02-19 LAB — GLUCOSE, CAPILLARY
GLUCOSE-CAPILLARY: 143 mg/dL — AB (ref 70–99)
Glucose-Capillary: 118 mg/dL — ABNORMAL HIGH (ref 70–99)
Glucose-Capillary: 133 mg/dL — ABNORMAL HIGH (ref 70–99)
Glucose-Capillary: 115 mg/dL — ABNORMAL HIGH (ref 70–99)

## 2019-02-19 MED ORDER — PRO-STAT SUGAR FREE PO LIQD
30.0000 mL | Freq: Two times a day (BID) | ORAL | Status: DC
  Administered 2019-02-19 – 2019-02-21 (×5): 30 mL via ORAL
  Filled 2019-02-19 (×5): qty 30

## 2019-02-19 MED ORDER — RENA-VITE PO TABS
1.0000 | ORAL_TABLET | Freq: Every day | ORAL | Status: DC
  Administered 2019-02-19 – 2019-02-20 (×2): 1 via ORAL
  Filled 2019-02-19 (×2): qty 1

## 2019-02-19 MED ORDER — VANCOMYCIN HCL IN DEXTROSE 500-5 MG/100ML-% IV SOLN
500.0000 mg | INTRAVENOUS | Status: DC
  Filled 2019-02-19: qty 100

## 2019-02-19 MED ORDER — WARFARIN SODIUM 4 MG PO TABS
4.0000 mg | ORAL_TABLET | Freq: Once | ORAL | Status: AC
  Administered 2019-02-19: 4 mg via ORAL
  Filled 2019-02-19: qty 1

## 2019-02-19 NOTE — Progress Notes (Signed)
HD tx completed @ 0215 w/o problem UF goal met Blood rinsed back VSS Report called to Durene Fruits, RN

## 2019-02-19 NOTE — Progress Notes (Addendum)
[facility] Progress Note   Subjective: Pleasantly confused, C/O pain R stump "hurts a little bit". Looking for WC-explained he needs to stay in bed while in hospital.   Objective Vitals:   02/19/19 0215 02/19/19 0234 02/19/19 0547 02/19/19 0758  BP: 110/63 (!) 107/58 111/66 115/76  Pulse: 96 94 88 93  Resp: (!) 22 (!) 22 12 14   Temp:  97.8 F (36.6 C) 98.3 F (36.8 C) 98.5 F (36.9 C)  TempSrc:  Oral Oral Oral  SpO2: 98% 97% 99% 100%  Weight:  52.3 kg    Height:       Physical Exam General: Chronically ill appearing elderly male in NAD, oriented to place not date/ year Heart: regularly irregular 2/6 systolic M.  Lungs: CTAB slightly decreased in bases.  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 CBC: Recent Labs  Lab 02/15/19 1242 02/15/19 2242 02/16/19 0029 02/17/19 1456  WBC 14.7* 12.4* 11.7* 10.1  NEUTROABS 11.8*  --   --   --   HGB 10.1* 10.4* 10.2* 11.2*  HCT 35.1* 34.9* 34.9* 37.9*  MCV 90.5 88.4 89.7 87.5  PLT 227 209 215 229   Blood Culture    Component Value Date/Time   SDES WOUND 02/17/2019 1145   SPECREQUEST RIGHT KNEE 02/17/2019 1145   CULT  02/17/2019 1145    FEW STAPHYLOCOCCUS AUREUS NO ANAEROBES ISOLATED; CULTURE IN PROGRESS FOR 5 DAYS    REPTSTATUS PENDING 02/17/2019 1145    Cardiac Enzymes: No results for  input(s): CKTOTAL, CKMB, CKMBINDEX, TROPONINI in the last 168 hours. CBG: Recent Labs  Lab 02/18/19 0654 02/18/19 1146 02/18/19 1648 02/18/19 2044 02/19/19 0700  GLUCAP 100* 171* 144* 137* 118*   Iron Studies: No results for input(s): IRON, TIBC, TRANSFERRIN, FERRITIN in the last 72 hours. @lablastinr3 @ Studies/Results: No results found. Medications: . sodium chloride    . sodium chloride    . sodium chloride    . piperacillin-tazobactam (ZOSYN)  IV 3.375 g (02/19/19 0513)   . Chlorhexidine Gluconate Cloth  6 each Topical Q0600  . Gerhardt's butt cream   Topical TID  . metoprolol tartrate  25 mg Oral BID  . midodrine  10 mg Oral Q M,W,F  . mupirocin ointment  1 application Nasal BID  . Warfarin - Pharmacist Dosing Inpatient   Does not apply q1800     HD orders: [facility] MWF 4 hrs 180NRe 400/Autoflow 1.5 56.5 kg 3.0 K/ 2.25 Ca UFP 2 linear Na L AVF -No heparin -Mircera 225 mcg IV q 2 weeks (last dose 02/08/19 Last HGB  9.5 02/08/19)   Assessment/Plan: 1. Rstump infection sp I&D 2/21 R AKA with wound vac placement. BC  NG 4 days. Wound cx positive for Staph A.  Continue Vanc/Zosyn. Wound vac change tomorrow. Per primary/VVS.  2. ESRD:MWF HD. HD late evening 02/18/19. HD tomorrow on schedule.  3. Hypertension/volume:HD 02/18/19 Pre wt 54.3 Net UF 2 liters post wt 52.3 kg. Has been losing weight, will need lower EDW on DC. Minimal UF with HD tomorrow. Chronic hypotension on midodrine.  4. Anemia:Hgb 11.2. No ESA needed. Follow HGB.  5. Metabolic bone disease:Ca 9.0 C Ca 10.5 No binder or VDRA. Use 2.0 Ca bath here but his home HD unit does NOT stock 3.0 K/2.0 Ca bath.  6. Nutrition:Albumin 2.1.Continue pro-stat, renal vits.  7. Paroxysmal atrial fibrillation: On metoprolol BID, does not take before dialysis. Continue coumadin per primary team.  8.    Dementia: Has been cooperative since adm. Requires sitter in OP center D/T pulling needles.   Rita H. Brown  NP-C 02/19/2019, 10:07 AM  [facility] [phone]  Pt seen, examined and agree w A/P as above.  [facility] 02/19/2019, 4:41 PM

## 2019-02-19 NOTE — Progress Notes (Signed)
HD tx initiated via 15Gx2 w/o problem Pull/push/flush well w/o problem VSS Will continue to monitor while on HD tx 

## 2019-02-19 NOTE — Progress Notes (Signed)
PROGRESS NOTE    [name]  EZM:[id number] DOB: 08/08/1941 DOA: 02/15/2019 PCP: Hennie Duos, MD   Brief Narrative: [name] is a 78 y.o. 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 Patient here with sepsis secondary to a right stump infection. Currently on antibiotics with plans for debridement.   Assessment & Plan:   Principal Problem:   Amputation stump infection ([place]) Active Problems:   DM (diabetes mellitus) ([place])   PAF (paroxysmal atrial fibrillation) ([place])   ESRD (end stage renal disease) ([place])   Sepsis ([place])   HTN (hypertension)   Right stump infection Associated abscess. Debridement performed on 2/21. Wound vac applied. -Continue Vancomycin IV/Zosyn (wound culture pending) -Vascular surgery recommendations: s/p debridement and wound vac; recs pending today  Sepsis Secondary to stump infection. Physiology resolved.  ESRD on HD Nephrology consulted. Per nephrology.  Chronic anemia Stable.  Essential hypertension Stable. Gets midodrine on HD days -Continue metoprolol.   Diabetes mellitus, type 2 Diet controlled. Hemoglobin A1C of 4.9%.  Paroxysmal atrial fibrillation On coumadin as an outpatient. Currently held for possible procedure. Rate controlled. -Continue metoprolol -Coumadin restarted after discussing with vascular  Dementia Stable  OSA -CPAP qhs  Pressure injury Right buttock. POA.  Diarrhea No associated abdominal pain. -Imodium prn   DVT prophylaxis: Coumadin Code Status:   Code Status: DNR Family Communication: None at bedside Disposition Plan: Discharge pending vascular recommendations   Consultants:   Vascular surgery  Procedures:   None  Antimicrobials:  Vancomycin  Zosyn    Subjective: Buttock pain improved. No diarrhea. Has some  pain over stump area  Objective: Vitals:   02/19/19 0200 02/19/19 0215 02/19/19 0234 02/19/19 0547  BP: 117/70 110/63 (!) 107/58 111/66  Pulse: 96 96 94 88  Resp: 16 (!) 22 (!) 22 12  Temp:   97.8 F (36.6 C) 98.3 F (36.8 C)  TempSrc:   Oral Oral  SpO2: 98% 98% 97% 99%  Weight:   52.3 kg   Height:        Intake/Output Summary (Last 24 hours) at 02/19/2019 0610 Last data filed at 02/19/2019 0215 Gross per 24 hour  Intake 600 ml  Output 2014 ml  Net -1414 ml   Filed Weights   02/17/19 1111 02/18/19 2306 02/19/19 0234  Weight: 53.3 kg 54.3 kg 52.3 kg    Examination:  General exam: Appears calm and comfortable Respiratory system: Clear to auscultation. Respiratory effort normal. Cardiovascular system: S1 & S2 heard, RRR. 2/6 systolic murmur. Gastrointestinal system: Abdomen is nondistended, soft and nontender. No organomegaly or masses felt. Normal bowel sounds heard. Central nervous system: Alert and oriented. No focal neurological deficits. Extremities: bilateral AKA Skin: No cyanosis. No rashes Psychiatry: Judgement and insight appear normal. Mood & affect appropriate.     Data Reviewed: I have personally reviewed following labs and imaging studies  CBC: Recent Labs  Lab 02/15/19 1242 02/15/19 2242 02/16/19 0029 02/17/19 1456  WBC 14.7* 12.4* 11.7* 10.1  NEUTROABS 11.8*  --   --   --   HGB 10.1* 10.4* 10.2* 11.2*  HCT 35.1* 34.9* 34.9* 37.9*  MCV 90.5 88.4 89.7 87.5  PLT 227 209 215 503   Basic Metabolic Panel: Recent Labs  Lab 02/15/19 1242 02/15/19 2242 02/16/19 0029 02/17/19 1456  NA 142 141 143 139  K 3.9 4.8 3.8 4.0  CL  102 102 103 101  CO2 26 24 26 26   GLUCOSE 130* 106* 104* 142*  BUN 51* 58* 56* 23  CREATININE 6.40* 6.50* 6.64* 4.46*  CALCIUM 9.3 9.0 9.1 9.0  PHOS  --  4.0  --   --    GFR: Estimated Creatinine Clearance: 10.1 mL/min (A) (by C-G formula based on SCr of 4.46 mg/dL (H)). Liver Function Tests: Recent Labs  Lab  02/15/19 1242 02/15/19 2242  AST 30  --   ALT 25  --   ALKPHOS 198*  --   BILITOT 0.9  --   PROT 7.0  --   ALBUMIN 2.2* 2.1*   No results for input(s): LIPASE, AMYLASE in the last 168 hours. No results for input(s): AMMONIA in the last 168 hours. Coagulation Profile: Recent Labs  Lab 02/15/19 1242 02/16/19 1257 02/17/19 0337 02/18/19 0605 02/19/19 0454  INR 2.07 1.99 2.06 1.90 1.86   Cardiac Enzymes: No results for input(s): CKTOTAL, CKMB, CKMBINDEX, TROPONINI in the last 168 hours. BNP (last 3 results) No results for input(s): PROBNP in the last 8760 hours. HbA1C: No results for input(s): HGBA1C in the last 72 hours. CBG: Recent Labs  Lab 02/18/19 0604 02/18/19 0654 02/18/19 1146 02/18/19 1648 02/18/19 2044  GLUCAP 109* 100* 171* 144* 137*   Lipid Profile: No results for input(s): CHOL, HDL, LDLCALC, TRIG, CHOLHDL, LDLDIRECT in the last 72 hours. Thyroid Function Tests: No results for input(s): TSH, T4TOTAL, FREET4, T3FREE, THYROIDAB in the last 72 hours. Anemia Panel: No results for input(s): VITAMINB12, FOLATE, FERRITIN, TIBC, IRON, RETICCTPCT in the last 72 hours. Sepsis Labs: Recent Labs  Lab 02/15/19 1242 02/15/19 2242 02/16/19 0029  LATICACIDVEN 2.0* 1.8 1.8    Recent Results (from the past 240 hour(s))  Blood Culture (routine x 2)     Status: None (Preliminary result)   Collection Time: 02/15/19 12:39 PM  Result Value Ref Range Status   Specimen Description BLOOD RIGHT ANTECUBITAL  Final   Special Requests   Final    BOTTLES DRAWN AEROBIC AND ANAEROBIC Blood Culture adequate volume   Culture NO GROWTH 3 DAYS  Final   Report Status PENDING  Incomplete  Blood Culture (routine x 2)     Status: None (Preliminary result)   Collection Time: 02/15/19 12:42 PM  Result Value Ref Range Status   Specimen Description BLOOD RIGHT HAND  Final   Special Requests   Final    BOTTLES DRAWN AEROBIC AND ANAEROBIC Blood Culture results may not be optimal due to  an inadequate volume of blood received in culture bottles   Culture NO GROWTH 3 DAYS  Final   Report Status PENDING  Incomplete  MRSA PCR Screening     Status: Abnormal   Collection Time: 02/16/19  2:05 PM  Result Value Ref Range Status   MRSA by PCR POSITIVE (A) NEGATIVE Final    Comment:        The GeneXpert MRSA Assay (FDA approved for NASAL specimens only), is one component of a comprehensive MRSA colonization surveillance program. It is not intended to diagnose MRSA infection nor to guide or monitor treatment for MRSA infections. RESULT CALLED TO, READ BACK BY AND VERIFIED WITH: Jasper Riling RN 17:25 02/16/19 ([email or web address]) Performed at [facility], [place] [address], [place], [place] [address]   Aerobic/Anaerobic Culture (surgical/deep wound)     Status: None (Preliminary result)   Collection Time: 02/17/19 11:45 AM  Result Value Ref Range Status   Specimen Description WOUND  Final   Special Requests RIGHT KNEE  Final   Gram Stain   Final    FEW WBC PRESENT, PREDOMINANTLY PMN RARE GRAM POSITIVE COCCI Performed at [facility], [place] [address], [place], [place] [address]    Culture   Final    FEW STAPHYLOCOCCUS AUREUS NO ANAEROBES ISOLATED; CULTURE IN PROGRESS FOR 5 DAYS    Report Status PENDING  Incomplete         Radiology Studies: No results found.      Scheduled Meds: . Chlorhexidine Gluconate Cloth  6 each Topical Q0600  . Gerhardt's butt cream   Topical TID  . metoprolol tartrate  25 mg Oral BID  . midodrine  10 mg Oral Q M,W,F  . mupirocin ointment  1 application Nasal BID  . Warfarin - Pharmacist Dosing Inpatient   Does not apply q1800   Continuous Infusions: . sodium chloride    . sodium chloride    . sodium chloride    . piperacillin-tazobactam (ZOSYN)  IV 3.375 g (02/19/19 0513)     LOS: 4 days     Cordelia Poche, MD [facility] 02/19/2019, 6:10 AM  If 7PM-7AM, please contact night-coverage [email or web address]

## 2019-02-19 NOTE — Progress Notes (Signed)
   VASCULAR SURGERY ASSESSMENT & PLAN:   His VAC has a good seal.  He is for a VAC change tomorrow.  SUBJECTIVE:   No complaints.  PHYSICAL EXAM:   Vitals:   02/19/19 0215 02/19/19 0234 02/19/19 0547 02/19/19 0758  BP: 110/63 (!) 107/58 111/66 115/76  Pulse: 96 94 88 93  Resp: (!) 22 (!) 22 12 14   Temp:  97.8 F (36.6 C) 98.3 F (36.8 C) 98.5 F (36.9 C)  TempSrc:  Oral Oral Oral  SpO2: 98% 97% 99% 100%  Weight:  52.3 kg    Height:       VAC has a good seal.  LABS:   CBG (last 3)  Recent Labs    02/18/19 1648 02/18/19 2044 02/19/19 0700  GLUCAP 144* 137* 118*    PROBLEM LIST:    Principal Problem:   Amputation stump infection ([place]) Active Problems:   DM (diabetes mellitus) ([place])   PAF (paroxysmal atrial fibrillation) ([facility])   ESRD (end stage renal disease) ([place])   Sepsis ([place])   HTN (hypertension)   CURRENT MEDS:   . Chlorhexidine Gluconate Cloth  6 each Topical Q0600  . Gerhardt's butt cream   Topical TID  . metoprolol tartrate  25 mg Oral BID  . midodrine  10 mg Oral Q M,W,F  . mupirocin ointment  1 application Nasal BID  . Warfarin - Pharmacist Dosing Inpatient   Does not apply [place]: [phone] Office: [phone] 02/19/2019

## 2019-02-19 NOTE — Plan of Care (Signed)
  Problem: Coping: Goal: Level of anxiety will decrease Outcome: Progressing   Problem: Elimination: Goal: Will not experience complications related to bowel motility Outcome: Progressing   Problem: Pain Managment: Goal: General experience of comfort will improve Outcome: Progressing   Problem: Skin Integrity: Goal: Risk for impaired skin integrity will decrease Outcome: Progressing   

## 2019-02-19 NOTE — Progress Notes (Signed)
ANTICOAGULATION CONSULT NOTE - Follow Up Consult  Pharmacy Consult for Warfarin Indication: atrial fibrillation  Allergies  Allergen Reactions  . Occlusive Silicone Sheets [Silicone] Other (See Comments)    Unknown reaction (listed as 'occlusive adhesive' on [facility] 02/15/2019)  . Other Other (See Comments)    Unknown reaction to Occlusive adhesive  . Tape Itching    Use Cloth tape only    Patient Measurements: Height: 5\' 6"  (167.6 cm) Weight: 115 lb 4.8 oz (52.3 kg) IBW/kg (Calculated) : 63.8  Vital Signs: Temp: 98.5 F (36.9 C) (02/23 0758) Temp Source: Oral (02/23 0758) BP: 115/76 (02/23 0758) Pulse Rate: 93 (02/23 0758)  Labs: Recent Labs    02/17/19 0337 02/17/19 1456 02/18/19 0605 02/19/19 0454  HGB  --  11.2*  --   --   HCT  --  37.9*  --   --   PLT  --  229  --   --   LABPROT 23.0*  --  21.5* 21.2*  INR 2.06  --  1.90 1.86  CREATININE  --  4.46*  --   --     Estimated Creatinine Clearance: 10.1 mL/min (A) (by C-G formula based on SCr of 4.46 mg/dL (H)).  Assessment: 78 year old male on warfarin prior to admission to resume today for Afib INR = 1.86 Dose prior to admission was 2 mg po daily  Goal of Therapy:  INR 2-3 Monitor platelets by anticoagulation protocol: Yes   Plan:  Warfarin 4 mg po x 1 dose tonight Daily INR  Thank you Anette Guarneri, PharmD [phone]  02/19/2019,12:08 PM

## 2019-02-20 ENCOUNTER — Other Ambulatory Visit: Payer: Self-pay | Admitting: Internal Medicine

## 2019-02-20 DIAGNOSIS — Z89611 Acquired absence of right leg above knee: Secondary | ICD-10-CM

## 2019-02-20 DIAGNOSIS — Z89612 Acquired absence of left leg above knee: Secondary | ICD-10-CM

## 2019-02-20 DIAGNOSIS — A4101 Sepsis due to Methicillin susceptible Staphylococcus aureus: Secondary | ICD-10-CM

## 2019-02-20 LAB — CULTURE, BLOOD (ROUTINE X 2)
Culture: NO GROWTH
Culture: NO GROWTH
Special Requests: ADEQUATE

## 2019-02-20 LAB — CBC
HCT: 32.6 % — ABNORMAL LOW (ref 39.0–52.0)
Hemoglobin: 9.4 g/dL — ABNORMAL LOW (ref 13.0–17.0)
MCH: 25.4 pg — ABNORMAL LOW (ref 26.0–34.0)
MCHC: 28.8 g/dL — ABNORMAL LOW (ref 30.0–36.0)
MCV: 88.1 fL (ref 80.0–100.0)
Platelets: 207 10*3/uL (ref 150–400)
RBC: 3.7 MIL/uL — ABNORMAL LOW (ref 4.22–5.81)
RDW: 16.5 % — ABNORMAL HIGH (ref 11.5–15.5)
WBC: 11.1 10*3/uL — ABNORMAL HIGH (ref 4.0–10.5)
nRBC: 0 % (ref 0.0–0.2)

## 2019-02-20 LAB — RENAL FUNCTION PANEL
Albumin: 2.1 g/dL — ABNORMAL LOW (ref 3.5–5.0)
Anion gap: 13 (ref 5–15)
BUN: 27 mg/dL — ABNORMAL HIGH (ref 8–23)
CO2: 23 mmol/L (ref 22–32)
Calcium: 7.7 mg/dL — ABNORMAL LOW (ref 8.9–10.3)
Chloride: 101 mmol/L (ref 98–111)
Creatinine, Ser: 3.41 mg/dL — ABNORMAL HIGH (ref 0.61–1.24)
GFR calc Af Amer: 19 mL/min — ABNORMAL LOW (ref >60)
GFR calc non Af Amer: 16 mL/min — ABNORMAL LOW (ref >60)
Glucose, Bld: 101 mg/dL — ABNORMAL HIGH (ref 70–99)
Phosphorus: 2.4 mg/dL — ABNORMAL LOW (ref 2.5–4.6)
Potassium: 3 mmol/L — ABNORMAL LOW (ref 3.5–5.1)
Sodium: 137 mmol/L (ref 135–145)

## 2019-02-20 LAB — GLUCOSE, CAPILLARY
Glucose-Capillary: 86 mg/dL (ref 70–99)
Glucose-Capillary: 159 mg/dL — ABNORMAL HIGH (ref 70–99)

## 2019-02-20 LAB — PROTIME-INR
INR: 1.9
Prothrombin Time: 21.6 seconds — ABNORMAL HIGH (ref 11.4–15.2)

## 2019-02-20 MED ORDER — WARFARIN SODIUM 2 MG PO TABS
2.0000 mg | ORAL_TABLET | Freq: Once | ORAL | Status: DC
  Filled 2019-02-20: qty 1

## 2019-02-20 MED ORDER — METOPROLOL TARTRATE 12.5 MG HALF TABLET
12.5000 mg | ORAL_TABLET | Freq: Two times a day (BID) | ORAL | Status: DC
  Administered 2019-02-20 – 2019-02-21 (×2): 12.5 mg via ORAL
  Filled 2019-02-20 (×2): qty 1

## 2019-02-20 MED ORDER — SODIUM CHLORIDE 0.9 % IV SOLN: 100.0000 mL | INTRAVENOUS | Status: DC | PRN

## 2019-02-20 MED ORDER — AMOXICILLIN 500 MG PO CAPS
500.0000 mg | ORAL_CAPSULE | Freq: Every day | ORAL | Status: DC
  Administered 2019-02-21: 500 mg via ORAL
  Filled 2019-02-20 (×3): qty 1

## 2019-02-20 MED ORDER — HYDROCODONE-ACETAMINOPHEN 5-325 MG PO TABS: 1.0000 | ORAL_TABLET | Freq: Every day | ORAL | 0 refills | Status: DC | PRN

## 2019-02-20 MED ORDER — HYDROCODONE-ACETAMINOPHEN 5-325 MG PO TABS: 1.0000 | ORAL_TABLET | Freq: Two times a day (BID) | ORAL | 0 refills | Status: DC | PRN

## 2019-02-20 NOTE — Clinical Social Work Placement (Signed)
   CLINICAL SOCIAL WORK PLACEMENT  NOTE  Date:  02/20/2019  Patient Details  Name: [name] MRN: [id number] Date of Birth: 1941/09/19  Clinical Social Work is seeking post-discharge placement for this patient at the [place] level of care (*CSW will initial, date and re-position this form in  chart as items are completed):      Patient/family provided with [place] Work Department's list of facilities offering this level of care within the geographic area requested by the patient (or if unable, by the patient's family).  Yes   Patient/family informed of their freedom to choose among providers that offer the needed level of care, that participate in Medicare, Medicaid or managed care program needed by the patient, have an available bed and are willing to accept the patient.      Patient/family informed of [facility]'s ownership interest in [facility] and [facility], as well as of the fact that they are under no obligation to receive care at these facilities.  PASRR submitted to EDS on       PASRR number received on 02/20/19     Existing PASRR number confirmed on       FL2 transmitted to all facilities in geographic area requested by pt/family on 02/20/19     FL2 transmitted to all facilities within larger geographic area on       Patient informed that his/her managed care company has contracts with or will negotiate with certain facilities, including the following:        Yes   Patient/family informed of bed offers received.  Patient chooses bed at [facility]     Physician recommends and patient chooses bed at      Patient to be transferred to [facility] on 02/20/19.  Patient to be transferred to facility by [facility]     Patient family notified on 02/20/19 of transfer.  Name of family member notified:  [name] attempted to be contacted, lvm     PHYSICIAN Please sign DNR     Additional  Comment:    _______________________________________________ Alberteen Sam, LCSW 02/20/2019, 4:13 PM

## 2019-02-20 NOTE — Discharge Summary (Addendum)
Physician Discharge Summary  [name]:[id number] DOB: Oct 13, 1941 DOA: 02/15/2019  PCP: Hennie Duos, MD  Admit date: 02/15/2019 Discharge date: 02/21/2019  Admitted From: SNF Disposition: SNF  Recommendations for Outpatient Follow-up:  1. Follow up with PCP in 1 week 2. Make an appointment to follow up with vascular surgery in 1 week 3. Twice weekly wound vac changes 4. Please obtain BMP/CBC in one week 5. Please follow up on the following pending results: None  Home Health: None Equipment/Devices: Wound vac  Discharge Condition: Stable CODE STATUS: DNR Diet recommendation: Renal diet   Brief/Interim Summary:  Admission HPI written by Shela Leff, MD   Chief Complaint: Drainage from amputation stump site  HPI: [name] is a 78 y.o. 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  Patient is not sure why he is here.  Denies having any pain.  States he was told he has drainage from his amputation stump site.  States he missed his last dialysis session.  No additional history could be obtained from the patient.   Hospital course:  Right stump infection Associated abscess. Debridement performed on 2/21. Wound vac applied. Empirically treated with Vancomycin IV/Zosyn which was transitioned to amoxicillin after culture data significant for MSSA. S/p debridement and wound vac placed per vascular surgery and recommendations for twice weekly changes. Discharge with amoxicillin for 5 days (end date 02/24/2019).  Sepsis Secondary to MSSA stump infection. Physiology resolved.  ESRD on HD Nephrology consulted. Per nephrology.  Chronic anemia Stable.  Essential hypertension Stable. Gets  midodrine on HD days. Continue metoprolol on discharge.  Diabetes mellitus, type 2 Diet controlled. Hemoglobin A1C of 4.9%.  Paroxysmal atrial fibrillation On coumadin as an outpatient. Currently held for possible procedure. Rate controlled. -Continue metoprolol -Coumadin restarted after discussing with vascular  Discharge Diagnoses:  Principal Problem:   Amputation stump infection ([place]) Active Problems:   DM (diabetes mellitus) ([place])   PAF (paroxysmal atrial fibrillation) ([facility])   ESRD (end stage renal disease) ([place])   Sepsis ([place])   HTN (hypertension)   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[place])    Discharge Instructions   Allergies as of 02/21/2019      Reactions   Occlusive Silicone Sheets [silicone] Other (See Comments)   Unknown reaction (listed as 'occlusive adhesive' on [facility] 02/15/2019)   Other Other (See Comments)   Unknown reaction to Occlusive adhesive   Tape Itching   Use Cloth tape only      Medication List    STOP taking these medications   MIRCERA 200 MCG/0.3ML [place] Generic drug:  Methoxy PEG-Epoetin Beta     TAKE these medications   acetaminophen 500 MG tablet Commonly known as:  TYLENOL Take 1,000 mg by mouth 3 (three) times daily.   amoxicillin 500 MG capsule Commonly known as:  AMOXIL Take 1 capsule (500 mg total) by mouth daily at 6 PM for 4 days.   atorvastatin 20 MG tablet Commonly known as:  LIPITOR Take 1 tablet (20 mg total) by mouth daily at 6 PM.   doxercalciferol 4 MCG/2ML injection Commonly known as:  HECTOROL Inject 1 mL (2 mcg total) into the vein every Monday, Wednesday, and Friday with hemodialysis. What changed:    how much to take  additional instructions   feeding supplement (PRO-STAT SUGAR FREE 64) Liqd Take 30 mLs by mouth 2 (two) times daily. 9a and 5p   ferrous gluconate 240 (27 FE) MG tablet Commonly known as:  FERGON Take 1 tablet (240 mg total) by mouth 3 (three) times daily with meals.   furosemide  40 MG tablet Commonly known as:  LASIX Take 40 mg by mouth daily.   loperamide 2 MG tablet Commonly known as:  IMODIUM A-D Take 4 mg by mouth See admin instructions. Take 2 tablets ( 4 mg) by mouth  after 1st loose stool as needed for diarrhea   metoprolol tartrate 25 MG tablet Commonly known as:  LOPRESSOR Take 25 mg by mouth See admin instructions. Take one tablet (25 mg) by mouth twice daily on Tuesday, Thursday, Saturday and Sunday (non-dialysis days); take one tablet (25 mg) by mouth on Monday, Wednesday, Friday nights (dialysis days)   midodrine 10 MG tablet Commonly known as:  PROAMATINE Take 10 mg by mouth See admin instructions. Take one tablet (10 mg) by mouth on Monday, Wednesday, Friday 30 minutes prior to dialysis.   multivitamin with minerals Tabs tablet Take 1 tablet by mouth daily.   pantoprazole 40 MG tablet Commonly known as:  PROTONIX Take 1 tablet (40 mg total) by mouth daily.   RENAL VITAMIN PO Take 1 tablet by mouth at bedtime.   warfarin 2 MG tablet Commonly known as:  COUMADIN Take 2 mg by mouth daily at 6 PM.      Follow-up Information    Hennie Duos, MD. Schedule an appointment as soon as possible for a visit in 1 week(s).   Specialty:  Internal Medicine Contact information: [place] [address] [phone]        Marty Heck, MD. Schedule an appointment as soon as possible for a visit in 1 week(s).   Specialty:  Vascular Surgery Why:  Stump wound Contact information: [address] [place] [address] [phone]          Allergies  Allergen Reactions  . Occlusive Silicone Sheets [Silicone] Other (See Comments)    Unknown reaction (listed as 'occlusive adhesive' on [facility] 02/15/2019)  . Other Other (See Comments)    Unknown reaction to Occlusive adhesive  . Tape Itching    Use Cloth tape only    Consultations:  Vascular surgery   Procedures/Studies: Dg Chest 1 View  Result Date:  02/15/2019 CLINICAL DATA:  Bleeding and drainage at femur amputation site. Possible infection. EXAM: CHEST  1 VIEW COMPARISON:  12/27/2018 FINDINGS: Grossly unchanged enlarged cardiac silhouette and mediastinal contours given supine positioning. Atherosclerotic plaque when the thoracic aorta. The pulmonary vasculature appears less distinct than present examination with cephalization of flow. There is mild elevation/eventration of right hemidiaphragm. No new discrete focal airspace opacities. No supine evidence pleural effusion or pneumothorax. No acute osseous abnormalities. IMPRESSION: Cardiomegaly with suspected mild pulmonary edema on this AP supine portable examination. Further evaluation with a PA and lateral chest radiograph may be obtained as clinically indicated. Electronically Signed   By: Sandi Mariscal M.D.   On: 02/15/2019 13:46   Mr Frmur Right Wo Contrast  Result Date: 02/15/2019 CLINICAL DATA:  Right AKA stump infection. Recent bilateral AKAs on 12/29/2018. EXAM: MRI OF THE RIGHT FEMUR WITHOUT CONTRAST TECHNIQUE: Multiplanar, multisequence MR imaging of the right femur was performed. No intravenous contrast was administered. COMPARISON:  Right femur x-rays from same day. FINDINGS: Limited examination due to motion artifact  and SAR restrictions given history of left SFA stent. Bones/Joint/Cartilage Prior bilateral above knee amputations. No suspicious marrow signal abnormality. Unchanged 1.8 cm well-defined, T2 hyperintense lesion in the right femoral head without surrounding marrow edema. No fracture or dislocation. Normal alignment. No hip joint effusion. Muscles and Tendons Edema within the vastus lateralis and intermedius muscles. Soft tissue Ill-defined 1.7 x 4.2 x 4.2 cm complex fluid collection in the soft tissues of the right amputation stump. This appears to communicate with the skin surface. There is an additional 2.8 x 5.8 x 5.4 cm crescentic, complex fluid collection draped over the distal  aspect of the residual femoral diaphysis, which may communicate with the other fluid collection. There is a small amount of soft tissue edema in the left amputation stump site without discrete fluid collection. IMPRESSION: 1. Complex fluid collections in the soft tissues of the right amputation stump and draped over the distal aspect of the residual femoral diaphysis, concerning for abscess. These may communicate. CT of the right thigh with contrast may be useful for further evaluation given limitations of this study as described above. 2. Prior bilateral above knee amputations. No evidence of osteomyelitis. 3. Nonspecific right vastus lateralis and intermedius myositis, potentially infectious in etiology. 4. Unchanged 1.8 cm non-aggressive appearing lesion in the right femoral head. This demonstrated no uptake on recent bone scan and may represent a geode or intraosseous ganglion cyst. Electronically Signed   By: Titus Dubin M.D.   On: 02/15/2019 17:03   Dg Femur Min 2 Views Right  Result Date: 02/15/2019 CLINICAL DATA:  Bleeding and drainage at site of amputation. Evaluate for infection. EXAM: RIGHT FEMUR 2 VIEWS COMPARISON:  None. FINDINGS: Post right above knee amputation. Scattered foci of subcutaneous emphysema about the distal aspect of the residual limb. No radiopaque foreign body. No discrete areas of osteolysis to suggest osteomyelitis. Vascular calcifications. Limited visualization of the adjacent hip and lower pelvis is normal. IMPRESSION: 1. Scattered foci of subcutaneous emphysema about the distal aspect the residual limb, nonspecific though in the absence of recent intervention, a gas producing organism could have resulted in a similar appearance. Clinical correlation is advised. Further evaluation with MRI could be performed as indicated. 2. No radiographic evidence of osteomyelitis involving the residual femur. Electronically Signed   By: Sandi Mariscal M.D.   On: 02/15/2019 13:45      Subjective: No concerns. Had some diarrhea. No abdominal pain.  Discharge Exam: Vitals:   02/20/19 0622 02/20/19 1004  BP: 130/74 130/74  Pulse: (!) 103 (!) 103  Resp:    Temp: 98 F (36.7 C)   SpO2: 98%    Vitals:   02/19/19 1333 02/19/19 2037 02/20/19 0622 02/20/19 1004  BP: 120/62 103/65 130/74 130/74  Pulse: 85 98 (!) 103 (!) 103  Resp: 14     Temp: 98.2 F (36.8 C) 97.9 F (36.6 C) 98 F (36.7 C)   TempSrc: Oral Oral Oral   SpO2: 100% 100% 98%   Weight:      Height:        General: Pt is alert, awake, not in acute distress Cardiovascular: RRR, S1/S2 +, no rubs, no gallops Respiratory: CTA bilaterally, no wheezing, no rhonchi Abdominal: Soft, NT, ND, bowel sounds + Extremities: no edema, no cyanosis. Bilateral AKA. Right stump with wound vac and no surrounding erythema.    The results of significant diagnostics from this hospitalization (including imaging, microbiology, ancillary and laboratory) are listed below for reference.     Microbiology:  Recent Results (from the past 240 hour(s))  Blood Culture (routine x 2)     Status: None (Preliminary result)   Collection Time: 02/15/19 12:39 PM  Result Value Ref Range Status   Specimen Description BLOOD RIGHT ANTECUBITAL  Final   Special Requests   Final    BOTTLES DRAWN AEROBIC AND ANAEROBIC Blood Culture adequate volume   Culture NO GROWTH 4 DAYS  Final   Report Status PENDING  Incomplete  Blood Culture (routine x 2)     Status: None (Preliminary result)   Collection Time: 02/15/19 12:42 PM  Result Value Ref Range Status   Specimen Description BLOOD RIGHT HAND  Final   Special Requests   Final    BOTTLES DRAWN AEROBIC AND ANAEROBIC Blood Culture results may not be optimal due to an inadequate volume of blood received in culture bottles   Culture NO GROWTH 4 DAYS  Final   Report Status PENDING  Incomplete  MRSA PCR Screening     Status: Abnormal   Collection Time: 02/16/19  2:05 PM  Result Value Ref  Range Status   MRSA by PCR POSITIVE (A) NEGATIVE Final    Comment:        The GeneXpert MRSA Assay (FDA approved for NASAL specimens only), is one component of a comprehensive MRSA colonization surveillance program. It is not intended to diagnose MRSA infection nor to guide or monitor treatment for MRSA infections. RESULT CALLED TO, READ BACK BY AND VERIFIED WITH: Jasper Riling RN 17:25 02/16/19 ([email or web address]) Performed at [facility], [place] [address] [place], [place] [address]   Aerobic/Anaerobic Culture (surgical/deep wound)     Status: None (Preliminary result)   Collection Time: 02/17/19 11:45 AM  Result Value Ref Range Status   Specimen Description WOUND  Final   Special Requests RIGHT KNEE  Final   Gram Stain   Final    FEW WBC PRESENT, PREDOMINANTLY PMN RARE GRAM POSITIVE COCCI    Culture   Final    FEW STAPHYLOCOCCUS AUREUS NO ANAEROBES ISOLATED; CULTURE IN PROGRESS FOR 5 DAYS    Report Status PENDING  Incomplete   Organism ID, Bacteria STAPHYLOCOCCUS AUREUS  Final      Susceptibility   Staphylococcus aureus - MIC*    CIPROFLOXACIN >=8 RESISTANT Resistant     ERYTHROMYCIN 4 INTERMEDIATE Intermediate     GENTAMICIN <=0.5 SENSITIVE Sensitive     OXACILLIN 0.5 SENSITIVE Sensitive     TETRACYCLINE >=16 RESISTANT Resistant     VANCOMYCIN <=0.5 SENSITIVE Sensitive     TRIMETH/SULFA >=320 RESISTANT Resistant     CLINDAMYCIN <=0.25 SENSITIVE Sensitive     RIFAMPIN <=0.5 SENSITIVE Sensitive     Inducible Clindamycin NEGATIVE Sensitive     * FEW STAPHYLOCOCCUS AUREUS     Labs: BNP (last 3 results) Recent Labs    11/06/18 1905  BNP 0,354.6*   Basic Metabolic Panel: Recent Labs  Lab 02/15/19 1242 02/15/19 2242 02/16/19 0029 02/17/19 1456  NA 142 141 143 139  K 3.9 4.8 3.8 4.0  CL 102 102 103 101  CO2 26 24 26 26   GLUCOSE 130* 106* 104* 142*  BUN 51* 58* 56* 23  CREATININE 6.40* 6.50* 6.64* 4.46*  CALCIUM 9.3 9.0 9.1 9.0  PHOS  --  4.0  --   --    Liver  Function Tests: Recent Labs  Lab 02/15/19 1242 02/15/19 2242  AST 30  --   ALT 25  --   ALKPHOS 198*  --  BILITOT 0.9  --   PROT 7.0  --   ALBUMIN 2.2* 2.1*   No results for input(s): LIPASE, AMYLASE in the last 168 hours. No results for input(s): AMMONIA in the last 168 hours. CBC: Recent Labs  Lab 02/15/19 1242 02/15/19 2242 02/16/19 0029 02/17/19 1456  WBC 14.7* 12.4* 11.7* 10.1  NEUTROABS 11.8*  --   --   --   HGB 10.1* 10.4* 10.2* 11.2*  HCT 35.1* 34.9* 34.9* 37.9*  MCV 90.5 88.4 89.7 87.5  PLT 227 209 215 229   Cardiac Enzymes: No results for input(s): CKTOTAL, CKMB, CKMBINDEX, TROPONINI in the last 168 hours. BNP: Invalid input(s): POCBNP CBG: Recent Labs  Lab 02/19/19 0700 02/19/19 1117 02/19/19 1636 02/19/19 2113 02/20/19 0731  GLUCAP 118* 133* 115* 143* 86   D-Dimer No results for input(s): DDIMER in the last 72 hours. Hgb A1c No results for input(s): HGBA1C in the last 72 hours. Lipid Profile No results for input(s): CHOL, HDL, LDLCALC, TRIG, CHOLHDL, LDLDIRECT in the last 72 hours. Thyroid function studies No results for input(s): TSH, T4TOTAL, T3FREE, THYROIDAB in the last 72 hours.  Invalid input(s): FREET3 Anemia work up No results for input(s): VITAMINB12, FOLATE, FERRITIN, TIBC, IRON, RETICCTPCT in the last 72 hours. Urinalysis    Component Value Date/Time   COLORURINE YELLOW 10/20/2008 [place] 10/20/2008 0942   LABSPEC 1.011 10/20/2008 0942   PHURINE 6.0 10/20/2008 0942   GLUCOSEU NEGATIVE 10/20/2008 0942   HGBUR LARGE (A) 10/20/2008 0942   BILIRUBINUR NEGATIVE 10/20/2008 0942   KETONESUR NEGATIVE 10/20/2008 0942   PROTEINUR 100 (A) 10/20/2008 0942   UROBILINOGEN 1.0 10/20/2008 0942   NITRITE NEGATIVE 10/20/2008 0942   LEUKOCYTESUR SMALL (A) 10/20/2008 0942   Sepsis Labs Invalid input(s): PROCALCITONIN,  WBC,  LACTICIDVEN Microbiology Recent Results (from the past 240 hour(s))  Blood Culture (routine x 2)      Status: None (Preliminary result)   Collection Time: 02/15/19 12:39 PM  Result Value Ref Range Status   Specimen Description BLOOD RIGHT ANTECUBITAL  Final   Special Requests   Final    BOTTLES DRAWN AEROBIC AND ANAEROBIC Blood Culture adequate volume   Culture NO GROWTH 4 DAYS  Final   Report Status PENDING  Incomplete  Blood Culture (routine x 2)     Status: None (Preliminary result)   Collection Time: 02/15/19 12:42 PM  Result Value Ref Range Status   Specimen Description BLOOD RIGHT HAND  Final   Special Requests   Final    BOTTLES DRAWN AEROBIC AND ANAEROBIC Blood Culture results may not be optimal due to an inadequate volume of blood received in culture bottles   Culture NO GROWTH 4 DAYS  Final   Report Status PENDING  Incomplete  MRSA PCR Screening     Status: Abnormal   Collection Time: 02/16/19  2:05 PM  Result Value Ref Range Status   MRSA by PCR POSITIVE (A) NEGATIVE Final    Comment:        The GeneXpert MRSA Assay (FDA approved for NASAL specimens only), is one component of a comprehensive MRSA colonization surveillance program. It is not intended to diagnose MRSA infection nor to guide or monitor treatment for MRSA infections. RESULT CALLED TO, READ BACK BY AND VERIFIED WITH: Jasper Riling RN 17:25 02/16/19 ([email or web address]) Performed at [facility], [place] [address], [place], [place] [address]   Aerobic/Anaerobic Culture (surgical/deep wound)     Status: None (Preliminary result)   Collection  Time: 02/17/19 11:45 AM  Result Value Ref Range Status   Specimen Description WOUND  Final   Special Requests RIGHT KNEE  Final   Gram Stain   Final    FEW WBC PRESENT, PREDOMINANTLY PMN RARE GRAM POSITIVE COCCI    Culture   Final    FEW STAPHYLOCOCCUS AUREUS NO ANAEROBES ISOLATED; CULTURE IN PROGRESS FOR 5 DAYS    Report Status PENDING  Incomplete   Organism ID, Bacteria STAPHYLOCOCCUS AUREUS  Final      Susceptibility   Staphylococcus aureus - MIC*    CIPROFLOXACIN  >=8 RESISTANT Resistant     ERYTHROMYCIN 4 INTERMEDIATE Intermediate     GENTAMICIN <=0.5 SENSITIVE Sensitive     OXACILLIN 0.5 SENSITIVE Sensitive     TETRACYCLINE >=16 RESISTANT Resistant     VANCOMYCIN <=0.5 SENSITIVE Sensitive     TRIMETH/SULFA >=320 RESISTANT Resistant     CLINDAMYCIN <=0.25 SENSITIVE Sensitive     RIFAMPIN <=0.5 SENSITIVE Sensitive     Inducible Clindamycin NEGATIVE Sensitive     * FEW STAPHYLOCOCCUS AUREUS    SIGNED:   Cordelia Poche, MD [facility] 02/20/2019, 10:40 AM

## 2019-02-20 NOTE — Progress Notes (Signed)
ANTICOAGULATION CONSULT NOTE  Pharmacy Consult:  Coumadin Indication: atrial fibrillation  Allergies  Allergen Reactions  . Occlusive Silicone Sheets [Silicone] Other (See Comments)    Unknown reaction (listed as 'occlusive adhesive' on [facility] 02/15/2019)  . Other Other (See Comments)    Unknown reaction to Occlusive adhesive  . Tape Itching    Use Cloth tape only    Patient Measurements: Height: 5\' 6"  (167.6 cm) Weight: 115 lb 4.8 oz (52.3 kg) IBW/kg (Calculated) : 63.8  Vital Signs: Temp: 98 F (36.7 C) (02/24 0622) Temp Source: Oral (02/24 0622) BP: 130/74 (02/24 1004) Pulse Rate: 103 (02/24 1004)  Labs: Recent Labs    02/17/19 1456 02/18/19 0605 02/19/19 0454 02/20/19 0929  HGB 11.2*  --   --   --   HCT 37.9*  --   --   --   PLT 229  --   --   --   LABPROT  --  21.5* 21.2* 21.6*  INR  --  1.90 1.86 1.9  CREATININE 4.46*  --   --   --     Estimated Creatinine Clearance: 10.1 mL/min (A) (by C-G formula based on SCr of 4.46 mg/dL (H)).  Assessment: 21 YOM with history of Afib to continue on Coumadin from PTA.  INR starting to trending up and is nearing goal.  No bleeding reported.  Home Coumadin dose: 2mg  PO daily  Goal of Therapy:  INR 2-3 Monitor platelets by anticoagulation protocol: Yes   Plan:  Coumadin 2mg  PO today Daily PT / INR  Draven Natter D. Mina Marble, PharmD, BCPS, [place] 02/20/2019, 10:30 AM

## 2019-02-20 NOTE — NC FL2 (Signed)
[place] LEVEL OF CARE SCREENING TOOL     IDENTIFICATION  Patient Name: [name] Birthdate: 1941-07-08 Sex: male Admission Date (Current Location): 02/15/2019  [facility] and [place] Number:  Herbalist and Address:  [facility] [place] [place] [address] [place], [place] [address]      Provider Number: [id number]  Attending Physician Name and Address:  Mariel Aloe, MD  Relative Name and Phone Number:  [name] (daughter) [phone]    Current Level of Care: Hospital Recommended Level of Care: [place] Prior Approval Number:    Date Approved/Denied: 09/13/08 PASRR Number: [id number]  Discharge Plan: SNF    Current Diagnoses: Patient Active Problem List   Diagnosis Date Noted  . Amputation stump infection ([place]) 02/15/2019  . Conjunctivitis of left eye 01/04/2019  . 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[place]) 12/30/2018  . Open wound of left foot   . Palliative care encounter   . OSA (obstructive sleep apnea)   . HTN (hypertension)   . ESRD on hemodialysis ([place])   . BPH (benign prostatic hyperplasia)   . Hypotension 12/07/2018  . GERD (gastroesophageal reflux disease) 12/07/2018  . Pressure injury of skin 12/03/2018  . Symptomatic anemia 12/02/2018  . Acute on chronic blood loss anemia 12/02/2018  . 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[place]) 12/02/2018  . Lower GI bleeding 12/02/2018  . Heme positive stool   . Acute on chronic diastolic (congestive) heart failure ([place]) 11/19/2018  . Acute CVA (cerebrovascular accident) ([place]) 11/19/2018  . Hypoglycemia 11/19/2018  . Peripheral vascular disease due to secondary diabetes ([place]) 11/19/2018  . Dry gangrene ([place]) 11/19/2018  . 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 DNR (do not resuscitate) discussion   . Goals of care, counseling/discussion   . Palliative care by specialist   . Acute hypoxemic respiratory failure ([place]) 11/06/2018  .  Volume overload 11/06/2018  . Multifocal pneumonia 11/06/2018  . Acute metabolic encephalopathy 66/05/3015  . Physical deconditioning 11/06/2018  . Acute respiratory failure with hypoxia and hypercapnia ([place]) 11/04/2018  . CAP (community acquired pneumonia) 11/04/2018  . Pleural effusion, right 11/04/2018  . Increased ammonia level 11/04/2018  . Abnormal CT of the abdomen 11/04/2018  . Atrial fibrillation with RVR ([place]) 11/04/2018  . Sepsis ([place]) 10/21/2018  . Acute encephalopathy 10/21/2018  . Elevated alkaline phosphatase level 10/21/2018  . Chronic anemia 10/21/2018  . Thrombocytopenia ([place]) 10/21/2018  . Pulmonary HTN ([place]) 03/04/2017  . Aortic valve stenosis 03/04/2017  . Hypertension, accelerated with heart disease, without CHF 01/12/2017  . Dyspnea on exertion 01/12/2017  . ESRD (end stage renal disease) ([place]) 06/07/2012  . Hyperlipidemia 04/24/2011  . HYPERTENSION, BENIGN 10/24/2009  . DM (diabetes mellitus) ([place]) 10/23/2009  . OBSTRUCTIVE SLEEP APNEA 10/23/2009  . PAF (paroxysmal atrial fibrillation) ([place]) 10/23/2009  . CHF (congestive heart failure) ([place]) 10/23/2009    Orientation RESPIRATION BLADDER Height & Weight     Self, Time, Situation, Place  Normal Continent Weight: 54 kg Height:  5\' 6"  (167.6 cm)  BEHAVIORAL SYMPTOMS/MOOD NEUROLOGICAL BOWEL NUTRITION STATUS      Continent Diet(see discharge summary)  AMBULATORY STATUS COMMUNICATION OF NEEDS Skin   Extensive Assist Verbally PU Stage and Appropriate Care(stage II pressure injury on buttocks, negative pressure wound therapy on thigh)                       Personal Care Assistance Level of Assistance  Bathing, Feeding, Dressing,  Total care Bathing Assistance: Maximum assistance Feeding assistance: Independent Dressing Assistance: Maximum assistance Total Care Assistance: Maximum assistance   Functional Limitations Info  Sight, Hearing, Speech Sight Info: Adequate Hearing Info: Adequate Speech Info:  Adequate    SPECIAL CARE FACTORS FREQUENCY  PT (By licensed PT), OT (By licensed OT)     PT Frequency: min 5x weekly OT Frequency: min 5x weekly            Contractures Contractures Info: Not present    Additional Factors Info  Code Status, Allergies, Isolation Precautions Code Status Info: DNR Allergies Info: Occlusive Silicone Sheets (silicone), occlusive adhesive, tape     Isolation Precautions Info: contact precautions     Current Medications (02/20/2019):  This is the current hospital active medication list Current Facility-Administered Medications  Medication Dose Route Frequency Provider Last Rate Last Dose  . 0.9 %  sodium chloride infusion   Intravenous Continuous Laurence Slate M, PA-C      . 0.9 %  sodium chloride infusion  100 mL Intravenous PRN Valentina Gu, NP      . 0.9 %  sodium chloride infusion  100 mL Intravenous PRN Valentina Gu, NP      . 0.9 %  sodium chloride infusion  100 mL Intravenous PRN Valentina Gu, NP      . 0.9 %  sodium chloride infusion  100 mL Intravenous PRN Valentina Gu, NP      . acetaminophen (TYLENOL) tablet 650 mg  650 mg Oral Q6H PRN Ulyses Amor, PA-C   650 mg at 02/19/19 2052   Or  . acetaminophen (TYLENOL) suppository 650 mg  650 mg Rectal Q6H PRN Ulyses Amor, PA-C      . amoxicillin (AMOXIL) capsule 500 mg  500 mg Oral q1800 Mariel Aloe, MD      . Chlorhexidine Gluconate Cloth 2 % PADS 6 each  6 each Topical Q0600 Ulyses Amor, PA-C   6 each at 02/20/19 [phone]  . feeding supplement (PRO-STAT SUGAR FREE 64) liquid 30 mL  30 mL Oral BID Valentina Gu, NP   30 mL at 02/20/19 1003  . Gerhardt's butt cream   Topical TID Mariel Aloe, MD   1 application at 95/62/13 1003  . lidocaine (PF) (XYLOCAINE) 1 % injection 5 mL  5 mL Intradermal PRN Valentina Gu, NP      . lidocaine-prilocaine (EMLA) cream 1 application  1 application Topical PRN Valentina Gu, NP      .  loperamide (IMODIUM) capsule 2 mg  2 mg Oral PRN Mariel Aloe, MD   2 mg at 02/19/19 0913  . metoprolol tartrate (LOPRESSOR) tablet 12.5 mg  12.5 mg Oral BID Deterding, Jeneen Rinks, MD      . midodrine (PROAMATINE) tablet 10 mg  10 mg Oral Q M,W,F Roney Jaffe, MD   10 mg at 02/20/19 1003  . multivitamin (RENA-VIT) tablet 1 tablet  1 tablet Oral QHS Valentina Gu, NP   1 tablet at 02/19/19 2052  . mupirocin ointment (BACTROBAN) 2 % 1 application  1 application Nasal BID Ulyses Amor, PA-C   1 application at 08/65/78 1003  . pentafluoroprop-tetrafluoroeth (GEBAUERS) aerosol 1 application  1 application Topical PRN Valentina Gu, NP      . warfarin (COUMADIN) tablet 2 mg  2 mg Oral ONCE-1800 Tyrone Apple, RPH      . Warfarin - Pharmacist Dosing Inpatient   Does not  apply q1800 Mariel Aloe, MD         Discharge Medications: Please see discharge summary for a list of discharge medications.  Relevant Imaging Results:  Relevant Lab Results:   Additional Information SSN: [id number]  Alberteen Sam, LCSW

## 2019-02-20 NOTE — Progress Notes (Signed)
Subjective: Interval History: has no complaint ,doing so-so.  Objective: Vital signs in last 24 hours: Temp:  [97.9 F (36.6 C)-98.2 F (36.8 C)] 98 F (36.7 C) (02/24 0622) Pulse Rate:  [85-103] 103 (02/24 1004) Resp:  [14] 14 (02/23 1333) BP: (103-130)/(62-74) 130/74 (02/24 1004) SpO2:  [98 %-100 %] 98 % (02/24 0622) Weight change:   Intake/Output from previous day: 02/23 0701 - 02/24 0700 In: 830 [P.O.:830] Out: -  Intake/Output this shift: Total I/O In: 320 [P.O.:320] Out: -   General appearance: alert, cooperative and no distress Resp: clear to auscultation bilaterally Cardio: S1, S2 normal and systolic murmur: systolic ejection 2/6, crescendo and decrescendo at 2nd left intercostal space GI: pos bs, soft, liver down 4 cm Extremities: L BKA, R BKA, with vac  Lab Results: Recent Labs    02/17/19 1456  WBC 10.1  HGB 11.2*  HCT 37.9*  PLT 229   BMET:  Recent Labs    02/17/19 1456  NA 139  K 4.0  CL 101  CO2 26  GLUCOSE 142*  BUN 23  CREATININE 4.46*  CALCIUM 9.0   No results for input(s): PTH in the last 72 hours. Iron Studies: No results for input(s): IRON, TIBC, TRANSFERRIN, FERRITIN in the last 72 hours.  Studies/Results: No results found.  I have reviewed the patient's current medications.  Assessment/Plan: 1 ESRD for HD 2 Dementia a major issue 3 anemia stable 4 PVD 5 Stump infx on AB 6 HPTH vit D 7 low bps lower metop P HD, esa, vit D, binders.  AB     LOS: 5 days   Jeneen Rinks Elnora Quizon 02/20/2019,10:34 AM

## 2019-02-20 NOTE — Procedures (Signed)
I was present at this session.  I have reviewed the session itself and made appropriate changes.  HD via LUA AVF.  Access pressok.  Relatively quiet for him . bp ok.   Jeneen Rinks Jamy Cleckler 2/24/20201:26 PM

## 2019-02-20 NOTE — Progress Notes (Signed)
[place] was Engineer, technical sales (nurse) regarding the need for [employer] wound vac system for discharge

## 2019-02-20 NOTE — Progress Notes (Signed)
[facility] [place]  Subjective  - Having some pain at R AKA stump I&D site.  Remains afebrile.  Looks like MSSA in wound on cultures.   Objective 130/74 (!) 103 98 F (36.7 C) (Oral) 14 98%  Intake/Output Summary (Last 24 hours) at 02/20/2019 0926 Last data filed at 02/19/2019 1700 Gross per 24 hour  Intake 520 ml  Output -  Net 520 ml    R AKA vac with good deal, no erythema, minimal serosanguinous drainage in vac  Laboratory Lab Results: Recent Labs    02/17/19 1456  WBC 10.1  HGB 11.2*  HCT 37.9*  PLT 229   BMET Recent Labs    02/17/19 1456  NA 139  K 4.0  CL 101  CO2 26  GLUCOSE 142*  BUN 23  CREATININE 4.46*  CALCIUM 9.0    COAG Lab Results  Component Value Date   INR 1.86 02/19/2019   INR 1.90 02/18/2019   INR 2.06 02/17/2019   No results found for: PTT  Assessment/Planning:  R AKA vac changed at bedside.  Wound showing good granulation tissue.  Minimal serosanguinous drainage.  Looks like OR cultures growing MSSA.  Can plan to arrange for wound vac for discharge and twice weekly changes.  Call vascular with questions or concerns.  Marty Heck 02/20/2019 9:26 AM --

## 2019-02-20 NOTE — Discharge Instructions (Signed)
[name],  You were in the hospital because of an infection in your right stump. This has been treated with surgery and antibiotics. Please follow-up with vascular surgery as an outpatient.

## 2019-02-20 NOTE — Progress Notes (Signed)
Report called to [place] Loss adjuster, chartered) at [facility].  Written discharge packet will be sent via PTAR.  The patient has a wound vac that was changed on Monday per vascular.  The nurse at [facility] will call back after consulting with the wound care nurse at [facility].

## 2019-02-20 NOTE — Progress Notes (Signed)
Patient will DC to:[facility] Anticipated DC date:02/20/2019 Family notified:[name] lvm Transport SH:FWYO  Per MD patient ready for DC to [facility] once returning from dialysis . RN, patient, patient's family, and facility notified of DC. Discharge Summary sent to facility. RN given number for report [phone] Room 307. DC packet on chart. Ambulance transport requested for patient by nurse once returning from dialysis.  CSW signing off.  [place], [place]

## 2019-02-20 NOTE — Progress Notes (Signed)
Dr Lonny Prude was sent a message and updated on the discharge for this patient this evening.  Awaiting to hear from Vascular regarding Wound Vac orders.

## 2019-02-20 NOTE — Clinical Social Work Note (Signed)
Clinical Social Work Assessment  Patient Details  Name: [name] MRN: [id number] Date of Birth: 01/18/1941  Date of referral:  02/20/19               Reason for consult:  Discharge Planning                Permission sought to share information with:  Case Manager, Facility Sport and exercise psychologist, Family Supports Permission granted to share information::  Yes, Verbal Permission Granted  Name::     [name]  Agency::  SNFs  Relationship::  daughter  Contact Information:  [phone]  Housing/Transportation Living arrangements for the past 2 months:  [place] of Information:  Patient Patient Interpreter Needed:  None Criminal Activity/Legal Involvement Pertinent to Current Situation/Hospitalization:  No - Comment as needed Significant Relationships:  Adult Children Lives with:  Self Do you feel safe going back to the place where you live?  Yes Need for family participation in patient care:  No (Coment)  Care giving concerns:  CSW received referral for possible SNF placement at time of discharge. Spoke with patient regarding possibility of SNF placement . Patient's family  is currently unable to care for him at their home given patient's current needs and fall risk.  Patient expressed understanding of PT recommendation and are agreeable to SNF placement at time of discharge. CSW to continue to follow and assist with discharge planning needs.     Social Worker assessment / plan:  Spoke with patient concerning possibility of rehab at SNF before returning home. Patient from [facility] and will return.    Employment status:  Retired Forensic scientist:  Medicare PT Recommendations:  [place] / Referral to community resources:  [place]  Patient/Family's Response to care:  Patient recognizes need for rehab before returning home and are agreeable to a SNF in [place]. They report preference for returning to  [facility]     . CSW explained insurance authorization process. Patient's family attempted to be contacted, no call backs at this time.    Patient/Family's Understanding of and Emotional Response to Diagnosis, Current Treatment, and Prognosis:  Patient/family is realistic regarding therapy needs and expressed being hopeful for SNF placement. Patient expressed understanding of CSW role and discharge process as well as medical condition. No questions/concerns about plan or treatment.    Emotional Assessment Appearance:  Appears stated age Attitude/Demeanor/Rapport:  Gracious Affect (typically observed):  Accepting Orientation:  Oriented to Self, Oriented to Place, Oriented to  Time, Oriented to Situation Alcohol / Substance use:  Not Applicable Psych involvement (Current and /or in the community):  No (Comment)  Discharge Needs  Concerns to be addressed:  Discharge Planning Concerns Readmission within the last 30 days:  No Current discharge risk:  Dependent with Mobility Barriers to Discharge:  Continued Medical Work up   [employer], [place] 02/20/2019, 4:11 PM

## 2019-02-20 NOTE — Progress Notes (Signed)
Dr Monica Martinez and Dr Deitra Mayo have been paged regarding order for the wound vac and the patient's discharge today.  [facility] has reported that they will have to order the wound vac and was not prepared for this.

## 2019-02-20 NOTE — Progress Notes (Signed)
Returns from dialysis- alert and oriented.  No distress noted.  Patient was incont of bowel.  Cleansed and Gerhardt's butt oinment applied from Pharmacy.  The patient has raw open areas on right and left buttocks and coccyx area.  The patient is aware that he will be discharged to [facility] today

## 2019-02-21 ENCOUNTER — Non-Acute Institutional Stay (SKILLED_NURSING_FACILITY): Payer: Medicare Other | Admitting: Internal Medicine

## 2019-02-21 ENCOUNTER — Encounter: Payer: Self-pay | Admitting: Internal Medicine

## 2019-02-21 DIAGNOSIS — T8740 Infection of amputation stump, unspecified extremity: Secondary | ICD-10-CM | POA: Diagnosis not present

## 2019-02-21 DIAGNOSIS — N186 End stage renal disease: Secondary | ICD-10-CM | POA: Diagnosis not present

## 2019-02-21 DIAGNOSIS — E1169 Type 2 diabetes mellitus with other specified complication: Secondary | ICD-10-CM

## 2019-02-21 DIAGNOSIS — K219 Gastro-esophageal reflux disease without esophagitis: Secondary | ICD-10-CM

## 2019-02-21 DIAGNOSIS — I953 Hypotension of hemodialysis: Secondary | ICD-10-CM

## 2019-02-21 DIAGNOSIS — A4101 Sepsis due to Methicillin susceptible Staphylococcus aureus: Secondary | ICD-10-CM | POA: Diagnosis not present

## 2019-02-21 DIAGNOSIS — Z992 Dependence on renal dialysis: Secondary | ICD-10-CM

## 2019-02-21 DIAGNOSIS — I480 Paroxysmal atrial fibrillation: Secondary | ICD-10-CM

## 2019-02-21 DIAGNOSIS — E785 Hyperlipidemia, unspecified: Secondary | ICD-10-CM

## 2019-02-21 LAB — PROTIME-INR
INR: 1.8 — ABNORMAL HIGH (ref 0.8–1.2)
Prothrombin Time: 21 seconds — ABNORMAL HIGH (ref 11.4–15.2)

## 2019-02-21 LAB — GLUCOSE, CAPILLARY
Glucose-Capillary: 87 mg/dL (ref 70–99)
Glucose-Capillary: 107 mg/dL — ABNORMAL HIGH (ref 70–99)

## 2019-02-21 MED ORDER — CHLORHEXIDINE GLUCONATE CLOTH 2 % EX PADS: 6.0000 | MEDICATED_PAD | Freq: Every day | CUTANEOUS | Status: DC

## 2019-02-21 MED ORDER — WARFARIN SODIUM 4 MG PO TABS
4.0000 mg | ORAL_TABLET | Freq: Once | ORAL | Status: DC
  Filled 2019-02-21: qty 1

## 2019-02-21 MED ORDER — AMOXICILLIN 500 MG PO CAPS: 500.0000 mg | ORAL_CAPSULE | Freq: Every day | ORAL | 0 refills | Status: AC

## 2019-02-21 MED ORDER — DARBEPOETIN ALFA 100 MCG/0.5ML IJ SOSY: 100.0000 ug | PREFILLED_SYRINGE | INTRAMUSCULAR | Status: DC

## 2019-02-21 NOTE — Progress Notes (Signed)
Patient not discharged 2/24 due to wound vac questions from facility in late evening, [facility] currently reports they are prepared for patient and are able to meet wound vac needs at this time and to please set up transportation.   CSW calling [facility] to transport patient to [facility].   [place], [place]

## 2019-02-21 NOTE — Progress Notes (Signed)
ANTICOAGULATION CONSULT NOTE  Pharmacy Consult:  Coumadin Indication: atrial fibrillation  Allergies  Allergen Reactions  . Occlusive Silicone Sheets [Silicone] Other (See Comments)    Unknown reaction (listed as 'occlusive adhesive' on [facility] 02/15/2019)  . Other Other (See Comments)    Unknown reaction to Occlusive adhesive  . Tape Itching    Use Cloth tape only    Patient Measurements: Height: 5\' 6"  (167.6 cm) Weight: 116 lb 13.5 oz (53 kg) IBW/kg (Calculated) : 63.8  Vital Signs: Temp: 98 F (36.7 C) (02/25 0421) Temp Source: Oral (02/25 0421) BP: 113/66 (02/25 0421) Pulse Rate: 93 (02/25 0421)  Labs: Recent Labs    02/19/19 0454 02/20/19 0929 02/20/19 1300 02/21/19 0155  HGB  --   --  9.4*  --   HCT  --   --  32.6*  --   PLT  --   --  207  --   LABPROT 21.2* 21.6*  --  21.0*  INR 1.86 1.9  --  1.8*  CREATININE  --   --  3.41*  --     Estimated Creatinine Clearance: 13.4 mL/min (A) (by C-G formula based on SCr of 3.41 mg/dL (H)).  Assessment: 17 YOM with history of Afib to continue on Coumadin from PTA. INR subtherapeutic at 1.8, 2mg  dose not given last night, reason not documented by RN.  Home Coumadin dose: 2mg  PO daily  Goal of Therapy:  INR 2-3 Monitor platelets by anticoagulation protocol: Yes   Plan:  Warfarin 4mg  PO x 1 tonight Daily INR, s/s bleeding  Bertis Ruddy, PharmD Clinical Pharmacist Please check AMION for all [place] numbers 02/21/2019 10:44 AM

## 2019-02-21 NOTE — Progress Notes (Signed)
Patient seen today. No changes to discharge summary recommendations. Stable for discharge.  Cordelia Poche, MD [facility] 02/21/2019, 11:33 AM

## 2019-02-21 NOTE — Progress Notes (Signed)
[facility]  Subjective  - No complaints.  Vac changed at bedside yesterday.   Objective 113/66 93 98 F (36.7 C) (Oral) 16 99%  Intake/Output Summary (Last 24 hours) at 02/21/2019 0815 Last data filed at 02/20/2019 1818 Gross per 24 hour  Intake 320 ml  Output 953 ml  Net -633 ml    R AKA vac with good deal, no erythema, serosanguinous drainage in vac  Laboratory Lab Results: Recent Labs    02/20/19 1300  WBC 11.1*  HGB 9.4*  HCT 32.6*  PLT 207   BMET Recent Labs    02/20/19 1300  NA 137  K 3.0*  CL 101  CO2 23  GLUCOSE 101*  BUN 27*  CREATININE 3.41*  CALCIUM 7.7*    COAG Lab Results  Component Value Date   INR 1.8 (H) 02/21/2019   INR 1.9 02/20/2019   INR 1.86 02/19/2019   No results found for: PTT  Assessment/Planning:  R AKA vac changed at bedside yesterday.  Wound showing good granulation tissue.  Minimal serosanguinous drainage.  OR cultures growing MSSA now s/p I&D and washout.  Can plan to arrange for wound vac for discharge and twice weekly changes.  Can be discharged from our standpoint.  Next vac change Thursday if still here.  Will arrange follow-up in several weeks for wound check.  Call vascular with questions or concerns.  Marty Heck 02/21/2019 8:15 AM --

## 2019-02-21 NOTE — Progress Notes (Signed)
RT asked patient about wearing CPAP HS. Patient states Yes. RT brings CPAP to room for set up. Patient refuses at this time.

## 2019-02-21 NOTE — Progress Notes (Signed)
[facility] Progress Note   Subjective:   Patient seen in room, eating breakfast. Feeling well, denies SOB/dyspnea, CP, N/V/D. Denies pain to R AKA site. Hoping to discharge today.   Objective Vitals:   02/20/19 1625 02/20/19 1723 02/20/19 2206 02/21/19 0421  BP: 122/66 123/67 117/75 113/66  Pulse: (!) 111  88 93  Resp: 18 18 16 16   Temp: (!) 97.4 F (36.3 C) 97.8 F (36.6 C) 98.2 F (36.8 C) 98 F (36.7 C)  TempSrc: Oral Oral Oral Oral  SpO2: 97% 98% 100% 99%  Weight: 53 kg     Height:       Physical Exam General: Alert, pleasant male in NAD Heart: RRR, 2/6 systolic ejection murmur L sternal border. Normal S1/S2 Lungs: Respirations even and unlabored, CTA bilaterally without wheezing, rhonchi or rales Abdomen: Soft, non-tender, non-distended. Normoactive BS Extremities: B/l AKAs, R AKA with wound vac. No edema.  Dialysis Access: LUE AVF, + thrill and bruit  Additional Objective Labs: Basic Metabolic Panel: Recent Labs  Lab 02/15/19 2242 02/16/19 0029 02/17/19 1456 02/20/19 1300  NA 141 143 139 137  K 4.8 3.8 4.0 3.0*  CL 102 103 101 101  CO2 24 26 26 23   GLUCOSE 106* 104* 142* 101*  BUN 58* 56* 23 27*  CREATININE 6.50* 6.64* 4.46* 3.41*  CALCIUM 9.0 9.1 9.0 7.7*  PHOS 4.0  --   --  2.4*   Liver Function Tests: Recent Labs  Lab 02/15/19 1242 02/15/19 2242 02/20/19 1300  AST 30  --   --   ALT 25  --   --   ALKPHOS 198*  --   --   BILITOT 0.9  --   --   PROT 7.0  --   --   ALBUMIN 2.2* 2.1* 2.1*   CBC: Recent Labs  Lab 02/15/19 1242 02/15/19 2242 02/16/19 0029 02/17/19 1456 02/20/19 1300  WBC 14.7* 12.4* 11.7* 10.1 11.1*  NEUTROABS 11.8*  --   --   --   --   HGB 10.1* 10.4* 10.2* 11.2* 9.4*  HCT 35.1* 34.9* 34.9* 37.9* 32.6*  MCV 90.5 88.4 89.7 87.5 88.1  PLT 227 209 215 229 207   Blood Culture    Component Value Date/Time   SDES WOUND 02/17/2019 1145   SPECREQUEST RIGHT KNEE 02/17/2019 1145   CULT  02/17/2019 1145   FEW STAPHYLOCOCCUS AUREUS NO ANAEROBES ISOLATED; CULTURE IN PROGRESS FOR 5 DAYS    REPTSTATUS PENDING 02/17/2019 1145    CBG: Recent Labs  Lab 02/19/19 1636 02/19/19 2113 02/20/19 0731 02/20/19 1148 02/21/19 0759  GLUCAP 115* 143* 86 159* 87   Medications: . sodium chloride     . amoxicillin  500 mg Oral q1800  . feeding supplement (PRO-STAT SUGAR FREE 64)  30 mL Oral BID  . Gerhardt's butt cream   Topical TID  . metoprolol tartrate  12.5 mg Oral BID  . midodrine  10 mg Oral Q M,W,F  . multivitamin  1 tablet Oral QHS  . mupirocin ointment  1 application Nasal BID  . warfarin  2 mg Oral ONCE-1800  . Warfarin - Pharmacist Dosing Inpatient   Does not apply q1800    Dialysis Orders: HD orders: [facility] MWF 4 hrs 180NRe 400/Autoflow 1.5 56.5 kg 3.0 K/ 2.25 Ca UFP 2 linear Na L AVF -No heparin -Mircera 225 mcg IV q 2 weeks (last dose 02/08/19 Last HGB 9.5 02/08/19)  Assessment/Plan: 1. Rstump infection:sp I&D 2/21R AKA with wound vac placement.  Wound cx positive for Staph A.  Continue Vanc/Zosyn. Per primary/VVS.  2. ESRD:MWF HD. HD late evening 02/18/19. K low, will use 3K bath tomorrow. HD tomorrow on schedule.  3. Hypertension/volume:Usually MWF. Last HD yesterday. Plan for dialysis tomorrow if not yet discharged. Chronic hypotension on midodrine. Will need EDW lowered at discharge. 4. Anemia:Hgb 9.4 post-op. Will give ESA with dialysis tomorrow. 5. 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 Phos low- 2.4. No binder or VDRA.  6. Nutrition:Albumin 2.1.Continue pro-stat, renal vits.  7. Paroxysmal atrial fibrillation: On metoprolol BID, does not take before dialysis. Continue coumadin per primary team.  8. Dementia: Has been cooperative since adm. Requires sitter in OP center D/T pulling  needles.   Anice Paganini, PA-C 02/21/2019, 9:24 AM  [facility] Pager: [phone]

## 2019-02-21 NOTE — Progress Notes (Signed)
:  Location:  [place] Room Number: [phone] Place of Service:  SNF (31)  Noah Delaine. [name], MD  Patient Care Team: Hennie Duos, MD as PCP - General (Internal Medicine) [name], MD as Consulting Physician (Nephrology) Rehab, [place] [facility], [facility]  Extended Emergency Contact Information Primary Emergency Contact: [name],[name] Address: [place], [place] [address] [name] [place] Phone: [phone] Work Phone: [phone] Mobile Phone: [phone] Relation: Daughter Secondary Emergency Contact: [name] Mobile Phone: [phone] Relation: Other     Allergies: Occlusive silicone sheets [silicone]; Other; and Tape  Chief Complaint  Patient presents with  . New Admit To SNF    Admit to [facility]    HPI: Patient is 78 y.o. male dementia, PAD status post bilateral AKA, with right AKA on 12/29/2018, atrial fibrillation, anemia, BPH, CHF, type 2 diabetes, end-stage renal disease on hemodialysis Monday Wednesday Friday, hypertension, hyperlipidemia, OSA on CPAP, who presented to the emergency department for drainage from his light lower extremity AKA stump site.  Drainage was noted by wound care nurse at skilled nursing facility, she called the surgeon, the surgeon told her to send patient to the emergency department.  Patient has had no fever chills nausea vomiting or other systemic symptom.  Patient was admitted to [facility] from 2/19-24 where he was found to have a right stump infection with an associated abscess that was debrided on 2/21 with wound VAC applied.  Patient was felt to have sepsis secondary to MSSA stump infection treated with vancomycin and Zosyn then transition to amoxicillin.  Patient is admitted to skilled nursing facility for OT/PT and for wound care.  While at skilled nursing facility patient will be followed for atrial  fibrillation treated with Lopressor and warfarin, hyperlipidemia treated with Lipitor and predialysis orthostatic hypotension treated with midodrine.  Past Medical History:  Diagnosis Date  . A-fib ([place])   . Anemia   . Blood transfusion   . BPH (benign prostatic hyperplasia)   . CHF (congestive heart failure) ([place])   . Diarrhea   . DM (diabetes mellitus) ([place])   . ESRD on hemodialysis ([place])    Started dialysis in 2009  . History of GI bleed    secondary to coumadin  . HTN (hypertension)   . Hyperlipidemia   . OSA (obstructive sleep apnea)    uses CPAP  . Secondary hyperparathyroidism of renal origin [facility])     Past Surgical History:  Procedure Laterality Date  . ABDOMINAL AORTOGRAM N/A 11/15/2018   Procedure: ABDOMINAL AORTOGRAM;  Surgeon: [name], MD;  Location: [place] CV LAB;  Service: Cardiovascular;  Laterality: N/A;  . AMPUTATION Left 12/06/2018   Procedure: AMPUTATION DIGIT LEFT FIFTH TOE;  Surgeon: Angelia Mould, MD;  Location: [place];  Service: Vascular;  Laterality: Left;  . AMPUTATION Bilateral 12/29/2018   Procedure: AMPUTATION ABOVE KNEE;  Surgeon: Waynetta Sandy, MD;  Location: [place];  Service: Vascular;  Laterality: Bilateral;  . APPLICATION OF WOUND VAC Right 02/17/2019   Procedure: Application Of Wound Vac;  Surgeon: Marty Heck, MD;  Location: [place];  Service: Vascular;  Laterality: Right;  . BVT  2/45/80   Left  Basilic Vein Transposition  . CHOLECYSTECTOMY    . EYE SURGERY     Catarct bil  . I&D EXTREMITY Right 02/17/2019   Procedure: Right above the  kneee debridement;  Surgeon: Marty Heck, MD;  Location: [place];  Service: Vascular;  Laterality: Right;  . INSERTION OF DIALYSIS CATHETER  05/28/2012   Procedure: INSERTION OF DIALYSIS CATHETER;  Surgeon: Mal Misty, MD;  Location: [place];  Service: Vascular;  Laterality: Right;  . Left arm shuntogram.    . Left forearm loop graft with 6 mm Gore-Tex graft.    .  LOWER EXTREMITY ANGIOGRAPHY Bilateral 11/15/2018   Procedure: LOWER EXTREMITY ANGIOGRAPHY;  Surgeon: [name], MD;  Location: [place] CV LAB;  Service: Cardiovascular;  Laterality: Bilateral;  . Pars plana vitrectomy with 25-gauge system    . PERIPHERAL VASCULAR ATHERECTOMY Left 11/15/2018   Procedure: PERIPHERAL VASCULAR ATHERECTOMY;  Surgeon: [name], MD;  Location: [place] CV LAB;  Service: Cardiovascular;  Laterality: Left;  SFA with STENT  . PERIPHERAL VASCULAR BALLOON ANGIOPLASTY Left 11/15/2018   Procedure: PERIPHERAL VASCULAR BALLOON ANGIOPLASTY;  Surgeon: [name], MD;  Location: [place] CV LAB;  Service: Cardiovascular;  Laterality: Left;  PT    Allergies as of 02/21/2019      Reactions   Occlusive Silicone Sheets [silicone] Other (See Comments)   Unknown reaction (listed as 'occlusive adhesive' on [facility] 02/15/2019)   Other Other (See Comments)   Unknown reaction to Occlusive adhesive   Tape Itching   Use Cloth tape only      Medication List       Accurate as of February 21, 2019 11:59 PM. Always use your most recent med list.        acetaminophen 500 MG tablet Commonly known as:  TYLENOL Take 1,000 mg by mouth 3 (three) times daily.   amoxicillin 500 MG capsule Commonly known as:  AMOXIL Take 1 capsule (500 mg total) by mouth daily at 6 PM for 4 days.   atorvastatin 20 MG tablet Commonly known as:  LIPITOR Take 1 tablet (20 mg total) by mouth daily at 6 PM.   doxercalciferol 4 MCG/2ML injection Commonly known as:  HECTOROL Inject 1 mL (2 mcg total) into the vein every Monday, Wednesday, and Friday with hemodialysis.   feeding supplement (PRO-STAT SUGAR FREE 64) Liqd Take 30 mLs by mouth 2 (two) times daily. 9a and 5p   ferrous gluconate 240 (27 FE) MG tablet Commonly known as:  FERGON Take 1 tablet (240 mg total) by mouth 3 (three) times daily with meals.   furosemide 40 MG tablet Commonly known as:  LASIX Take 40 mg by  mouth daily.   loperamide 2 MG tablet Commonly known as:  IMODIUM A-D Take 4 mg by mouth See admin instructions. Take 2 tablets ( 4 mg) by mouth  after 1st loose stool as needed for diarrhea   metoprolol tartrate 25 MG tablet Commonly known as:  LOPRESSOR Take 25 mg by mouth See admin instructions. Take one tablet (25 mg) by mouth twice daily on Tuesday, Thursday, Saturday and Sunday (non-dialysis days); take one tablet (25 mg) by mouth on Monday, Wednesday, Friday nights (dialysis days)   midodrine 10 MG tablet Commonly known as:  PROAMATINE Take 10 mg by mouth See admin instructions. Take one tablet (10 mg) by mouth on Monday, Wednesday, Friday 30 minutes prior to dialysis.   multivitamin with minerals Tabs tablet Take 1 tablet by mouth daily.   pantoprazole 40 MG tablet Commonly known as:  PROTONIX Take 1 tablet (40 mg total) by mouth daily.   RENAL VITAMIN PO Take 1 tablet by mouth at  bedtime.   warfarin 2 MG tablet Commonly known as:  COUMADIN Take 2 mg by mouth daily at 6 PM.       No orders of the defined types were placed in this encounter.    There is no immunization history on file for this patient.  Social History   Tobacco Use  . Smoking status: Former Smoker    Types: Cigarettes    Last attempt to quit: 06/30/2004    Years since quitting: 14.6  . Smokeless tobacco: Never Used  Substance Use Topics  . Alcohol use: No    Family history is   Family History  Problem Relation Age of Onset  . Alzheimer's disease Mother   . Diabetes Father        Amputation:  bilateral legs  . Cancer Daughter        breast cancer  . Diabetes Son   . Heart disease Son        before age 23  . Hypertension Son   . Anesthesia problems Neg Hx       Review of Systems   unable to obtain reliable secondary to dementia; nursing-no acute concerns      Vitals:   02/21/19 1538  BP: 130/74  Pulse: (!) 103  Temp: 98 F (36.7 C)  SpO2: 98%    SpO2 Readings from Last  1 Encounters:  02/21/19 98%   Body mass index is 18.85 kg/m.     Physical Exam  GENERAL APPEARANCE: Alert, minimally conversant,  No acute distress.  SKIN: No diaphoresis rash HEAD: Normocephalic, atraumatic  EYES: Conjunctiva/lids clear. Pupils round, reactive. EOMs intact.  EARS: External exam WNL, canals clear. Hearing grossly normal.  NOSE: No deformity or discharge.  MOUTH/THROAT: Lips w/o lesions  RESPIRATORY: Breathing is even, unlabored. Lung sounds are clear   CARDIOVASCULAR: Heart RRR no murmurs, rubs or gallops. No peripheral edema.   GASTROINTESTINAL: Abdomen is soft, non-tender, not distended w/ normal bowel sounds. GENITOURINARY: Bladder non tender, not distended  MUSCULOSKELETAL: Bilateral AKA; right AKA with postop dressing and wound VAC at stump site without swelling redness or heat NEUROLOGIC:  Cranial nerves 2-12 grossly intact. 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 Amputation stump infection ([place]) 02/15/2019  . Conjunctivitis of left eye 01/04/2019  . 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[place]) 12/30/2018  . Open wound of left foot   . Palliative care encounter   . OSA (obstructive sleep apnea)   . HTN (hypertension)   . ESRD on hemodialysis ([place])   . BPH (benign prostatic hyperplasia)   . Hypotension 12/07/2018  . GERD (gastroesophageal reflux disease) 12/07/2018  . Pressure injury of skin 12/03/2018  . Symptomatic anemia 12/02/2018  . Acute on chronic blood loss anemia 12/02/2018  . 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[place]) 12/02/2018  . Lower GI bleeding 12/02/2018  . Heme positive stool   . Acute on chronic diastolic (congestive) heart failure ([place]) 11/19/2018  . Acute CVA (cerebrovascular accident) ([place]) 11/19/2018  . Hypoglycemia 11/19/2018  . Peripheral vascular disease due to secondary diabetes ([place]) 11/19/2018  . Dry gangrene ([place])  11/19/2018  . 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 DNR (do not resuscitate) discussion   . Goals of care, counseling/discussion   . Palliative care by specialist   . Acute hypoxemic respiratory failure ([place]) 11/06/2018  . Volume overload 11/06/2018  . Multifocal pneumonia 11/06/2018  .  Acute metabolic encephalopathy 73/53/2992  . Physical deconditioning 11/06/2018  . Acute respiratory failure with hypoxia and hypercapnia ([place]) 11/04/2018  . CAP (community acquired pneumonia) 11/04/2018  . Pleural effusion, right 11/04/2018  . Increased ammonia level 11/04/2018  . Abnormal CT of the abdomen 11/04/2018  . Atrial fibrillation with RVR ([place]) 11/04/2018  . Sepsis ([place]) 10/21/2018  . Acute encephalopathy 10/21/2018  . Elevated alkaline phosphatase level 10/21/2018  . Chronic anemia 10/21/2018  . Thrombocytopenia ([place]) 10/21/2018  . Pulmonary HTN ([place]) 03/04/2017  . Aortic valve stenosis 03/04/2017  . Hypertension, accelerated with heart disease, without CHF 01/12/2017  . Dyspnea on exertion 01/12/2017  . ESRD (end stage renal disease) ([place]) 06/07/2012  . Hyperlipidemia 04/24/2011  . HYPERTENSION, BENIGN 10/24/2009  . DM (diabetes mellitus) ([place]) 10/23/2009  . OBSTRUCTIVE SLEEP APNEA 10/23/2009  . PAF (paroxysmal atrial fibrillation) ([place]) 10/23/2009  . CHF (congestive heart failure) ([place]) 10/23/2009      Labs reviewed: Basic Metabolic Panel:    Component Value Date/Time   NA 137 02/20/2019 1300   NA 146 01/31/2019   K 3.0 (L) 02/20/2019 1300   CL 101 02/20/2019 1300   CO2 23 02/20/2019 1300   GLUCOSE 101 (H) 02/20/2019 1300   BUN 27 (H) 02/20/2019 1300   BUN 21 01/31/2019   CREATININE 3.41 (H) 02/20/2019 1300   CALCIUM 7.7 (L) 02/20/2019 1300   CALCIUM 8.3 (L) 10/25/2008 1450   PROT 7.0 02/15/2019 1242   ALBUMIN 2.1 (L) 02/20/2019 1300   AST 30 02/15/2019 1242   ALT 25 02/15/2019 1242   ALKPHOS 198 (H) 02/15/2019 1242   BILITOT 0.9 02/15/2019  1242   GFRNONAA 16 (L) 02/20/2019 1300   GFRAA 19 (L) 02/20/2019 1300    Recent Labs    11/08/18 0016 11/09/18 0230  11/14/18 0241  01/02/19 1210  02/15/19 2242 02/16/19 0029 02/17/19 1456 02/20/19 1300  NA 138 137   < > 135   < > 137   < > 141 143 139 137  K 3.8 4.1   < > 5.0   < > 3.9   < > 4.8 3.8 4.0 3.0*  CL 100 100   < > 97*   < > 98   < > 102 103 101 101  CO2 26 27   < > 25   < > 22   < > 24 26 26 23   GLUCOSE 105* 94   < > 139*   < > 175*   < > 106* 104* 142* 101*  BUN 15 39*   < > 72*   < > 64*   < > 58* 56* 23 27*  CREATININE 3.19* 5.34*   < > 7.61*   < > 8.36*   < > 6.50* 6.64* 4.46* 3.41*  CALCIUM 8.8* 9.1   < > 9.8   < > 7.8*   < > 9.0 9.1 9.0 7.7*  MG 2.0 2.2  --  2.4  --   --   --   --   --   --   --   PHOS  --   --    < > 2.4*   < > 6.2*  --  4.0  --   --  2.4*   < > = values in this interval not displayed.   Liver Function Tests: Recent Labs    12/01/18 2353  12/23/18 1128  02/15/19 1242 02/15/19 2242 02/20/19 1300  AST 195*  --  26  --  30  --   --   ALT 231*  --  20  --  25  --   --   ALKPHOS 293*  --  290*  --  198*  --   --   BILITOT 1.1  --  0.9  --  0.9  --   --   PROT 6.6  --  7.0  --  7.0  --   --   ALBUMIN 2.9*   < > 2.4*   < > 2.2* 2.1* 2.1*   < > = values in this interval not displayed.   Recent Labs    10/21/18 0520 10/24/18 0614  LIPASE 35 32   Recent Labs    10/26/18 0538 11/06/18 1850 11/09/18 0230  AMMONIA 42* 40* 42*   CBC: Recent Labs    12/23/18 1128 12/24/18 0227  02/15/19 1242  02/16/19 0029 02/17/19 1456 02/20/19 1300  WBC 17.9* 15.6*   < > 14.7*   < > 11.7* 10.1 11.1*  NEUTROABS 15.5* 13.0*  --  11.8*  --   --   --   --   HGB 9.5* 10.5*   < > 10.1*   < > 10.2* 11.2* 9.4*  HCT 31.7* 33.5*   < > 35.1*   < > 34.9* 37.9* 32.6*  MCV 100.6* 99.4   < > 90.5   < > 89.7 87.5 88.1  PLT 223 204   < > 227   < > 215 229 207   < > = values in this interval not displayed.   Lipid Recent Labs    11/11/18 1517  CHOL  113  HDL 39*  LDLCALC 62  TRIG 60    Cardiac Enzymes: Recent Labs    11/06/18 1855  TROPONINI <0.03   BNP: Recent Labs    11/06/18 1905  BNP 1,611.6*   No results found for: [facility] Lab Results  Component Value Date   HGBA1C 4.9 02/16/2019   Lab Results  Component Value Date   TSH 1.956 11/09/2018   Lab Results  Component Value Date   [id number] 678 11/11/2018   Lab Results  Component Value Date   FOLATE  10/17/2008    >20.0 (NOTE)  Reference Ranges        Deficient:       0.4 - 3.3 ng/mL        Indeterminate:   3.4 - 5.4 ng/mL        Normal:              > 5.4 ng/mL   Lab Results  Component Value Date   IRON 48 06/05/2011   TIBC 194 (L) 06/05/2011   FERRITIN 872 (H) 06/05/2011    Imaging and Procedures obtained prior to SNF admission: Dg Chest 1 View  Result Date: 02/15/2019 CLINICAL DATA:  Bleeding and drainage at femur amputation site. Possible infection. EXAM: CHEST  1 VIEW COMPARISON:  12/27/2018 FINDINGS: Grossly unchanged enlarged cardiac silhouette and mediastinal contours given supine positioning. Atherosclerotic plaque when the thoracic aorta. The pulmonary vasculature appears less distinct than present examination with cephalization of flow. There is mild elevation/eventration of right hemidiaphragm. No new discrete focal airspace opacities. No supine evidence pleural effusion or pneumothorax. No acute osseous abnormalities. IMPRESSION: Cardiomegaly with suspected mild pulmonary edema on this AP supine portable examination. Further evaluation with a PA and lateral chest radiograph may be obtained as clinically indicated. Electronically Signed   By: Sandi Mariscal M.D.   On: 02/15/2019 13:46  Mr Frmur Right Wo Contrast  Result Date: 02/15/2019 CLINICAL DATA:  Right AKA stump infection. Recent bilateral AKAs on 12/29/2018. EXAM: MRI OF THE RIGHT FEMUR WITHOUT CONTRAST TECHNIQUE: Multiplanar, multisequence MR imaging of the right femur was performed. No  intravenous contrast was administered. COMPARISON:  Right femur x-rays from same day. FINDINGS: Limited examination due to motion artifact and SAR restrictions given history of left SFA stent. Bones/Joint/Cartilage Prior bilateral above knee amputations. No suspicious marrow signal abnormality. Unchanged 1.8 cm well-defined, T2 hyperintense lesion in the right femoral head without surrounding marrow edema. No fracture or dislocation. Normal alignment. No hip joint effusion. Muscles and Tendons Edema within the vastus lateralis and intermedius muscles. Soft tissue Ill-defined 1.7 x 4.2 x 4.2 cm complex fluid collection in the soft tissues of the right amputation stump. This appears to communicate with the skin surface. There is an additional 2.8 x 5.8 x 5.4 cm crescentic, complex fluid collection draped over the distal aspect of the residual femoral diaphysis, which may communicate with the other fluid collection. There is a small amount of soft tissue edema in the left amputation stump site without discrete fluid collection. IMPRESSION: 1. Complex fluid collections in the soft tissues of the right amputation stump and draped over the distal aspect of the residual femoral diaphysis, concerning for abscess. These may communicate. CT of the right thigh with contrast may be useful for further evaluation given limitations of this study as described above. 2. Prior bilateral above knee amputations. No evidence of osteomyelitis. 3. Nonspecific right vastus lateralis and intermedius myositis, potentially infectious in etiology. 4. Unchanged 1.8 cm non-aggressive appearing lesion in the right femoral head. This demonstrated no uptake on recent bone scan and may represent a geode or intraosseous ganglion cyst. Electronically Signed   By: Titus Dubin M.D.   On: 02/15/2019 17:03   Dg Femur Min 2 Views Right  Result Date: 02/15/2019 CLINICAL DATA:  Bleeding and drainage at site of amputation. Evaluate for infection.  EXAM: RIGHT FEMUR 2 VIEWS COMPARISON:  None. FINDINGS: Post right above knee amputation. Scattered foci of subcutaneous emphysema about the distal aspect of the residual limb. No radiopaque foreign body. No discrete areas of osteolysis to suggest osteomyelitis. Vascular calcifications. Limited visualization of the adjacent hip and lower pelvis is normal. IMPRESSION: 1. Scattered foci of subcutaneous emphysema about the distal aspect the residual limb, nonspecific though in the absence of recent intervention, a gas producing organism could have resulted in a similar appearance. Clinical correlation is advised. Further evaluation with MRI could be performed as indicated. 2. No radiographic evidence of osteomyelitis involving the residual femur. Electronically Signed   By: Sandi Mariscal M.D.   On: 02/15/2019 13:45     Not all labs, radiology exams or other studies done during hospitalization come through on my EPIC note; however they are reviewed by me.    Assessment and Plan  Right stump infection/sepsis secondary to- patient went to the OR on 2/21 for I&D, debridement and washing, wound VAC applied at that time.  Patient was empirically treated with vancomycin and Zosyn which was transitioned to amoxicillin after culture was positive for MSSA SNF- admitted for OT/PT and for wound care; continue amoxicillin 500 mg q. evening for 5 days  Stage renal disease on hemodialysis SNF- continue dialysis Monday Wednesday Friday  Atrial fibrillation SNF- controlled; continue metoprolol 25 mg twice daily on nondialysis day and 25 mg daily on dialysis days for controlled rate, and warfarin as anticoagulant, which will be  actively titrated  Orthostatic hypotension on dialysis days SNF- give midodrine 10 mg in the morning Monday Wednesday Friday before dialysis  Hyperlipidemia SNF- not stated as uncontrolled; continue Lipitor 20 mg daily  GERD SNF- not stated as uncontrolled; continue Protonix 40 mg  daily   Spent greater than 35 minutes;> 50% of time with patient was spent reviewing records, labs, tests and studies, counseling and developing plan of care  Webb Silversmith D. [name], MD

## 2019-02-22 ENCOUNTER — Encounter: Payer: Self-pay | Admitting: Internal Medicine

## 2019-02-22 DIAGNOSIS — E1169 Type 2 diabetes mellitus with other specified complication: Secondary | ICD-10-CM | POA: Insufficient documentation

## 2019-02-22 DIAGNOSIS — E785 Hyperlipidemia, unspecified: Secondary | ICD-10-CM

## 2019-02-22 LAB — AEROBIC/ANAEROBIC CULTURE W GRAM STAIN (SURGICAL/DEEP WOUND)

## 2019-02-22 LAB — AEROBIC/ANAEROBIC CULTURE (SURGICAL/DEEP WOUND)

## 2019-02-23 ENCOUNTER — Telehealth: Payer: Self-pay | Admitting: Vascular Surgery

## 2019-02-23 NOTE — Telephone Encounter (Signed)
-----   Message from Pennelope Bracken sent at 02/22/2019  9:27 AM EST -----   ----- Message ----- From: Marty Heck, MD Sent: 02/22/2019   7:29 AM EST To: [facility] Admin Pool  Can you arrange two week follow-up for wound check in PA clinic for Mr. Eimers?  Thanks,  Gerald Stabs

## 2019-02-23 NOTE — Telephone Encounter (Signed)
sch appt lvm mld ltr 03/08/2019 130pm p/o PA

## 2019-02-27 LAB — BASIC METABOLIC PANEL
BUN: 64 — AB (ref 4–21)
Creatinine: 5.4 — AB (ref 0.6–1.3)
Glucose: 99
Potassium: 4.6 (ref 3.4–5.3)
Sodium: 141 (ref 137–147)

## 2019-02-27 LAB — CBC AND DIFFERENTIAL
HCT: 32 — AB (ref 41–53)
Hemoglobin: 10.1 — AB (ref 13.5–17.5)
Platelets: 228 (ref 150–399)
WBC: 10.4

## 2019-03-14 ENCOUNTER — Ambulatory Visit (INDEPENDENT_AMBULATORY_CARE_PROVIDER_SITE_OTHER): Payer: Medicare Other | Admitting: Vascular Surgery

## 2019-03-14 ENCOUNTER — Encounter: Payer: Self-pay | Admitting: Vascular Surgery

## 2019-03-14 ENCOUNTER — Other Ambulatory Visit: Payer: Self-pay

## 2019-03-14 VITALS — BP 107/61 | HR 90 | Temp 97.8°F | Resp 18 | Wt 116.0 lb

## 2019-03-14 DIAGNOSIS — I779 Disorder of arteries and arterioles, unspecified: Secondary | ICD-10-CM

## 2019-03-14 NOTE — Progress Notes (Signed)
   Patient name: [name] MRN: [id number] DOB: 07/26/1941 Sex: male  REASON FOR VISIT: Wound check right above-knee amputation  HPI: [name] is a 78 y.o. male here today for follow-up.  He underwent bilateral above-knee amputations with Dr. Donzetta Matters 12/29/2018.  He had wound issues in his right above-knee amputation underwent debridement and VAC placement with Dr. Carlis Abbott on 02/17/2019.  He is here today for follow-up  Current Outpatient Medications  Medication Sig Dispense Refill  . acetaminophen (TYLENOL) 500 MG tablet Take 1,000 mg by mouth 3 (three) times daily.    . Amino Acids-Protein Hydrolys (FEEDING SUPPLEMENT, PRO-STAT SUGAR FREE 64,) LIQD Take 30 mLs by mouth 2 (two) times daily. 9a and 5p    . atorvastatin (LIPITOR) 20 MG tablet Take 1 tablet (20 mg total) by mouth daily at 6 PM. 30 tablet 0  . B Complex-C-Folic Acid (RENAL VITAMIN PO) Take 1 tablet by mouth at bedtime.     Marland Kitchen doxercalciferol (HECTOROL) 4 MCG/2ML injection Inject 1 mL (2 mcg total) into the vein every Monday, Wednesday, and Friday with hemodialysis. (Patient taking differently: Inject 1 mcg into the vein every Monday, Wednesday, and Friday with hemodialysis. 0.5 ml) 2 mL   . ferrous gluconate (FERGON) 240 (27 FE) MG tablet Take 1 tablet (240 mg total) by mouth 3 (three) times daily with meals. 90 each 0  . furosemide (LASIX) 40 MG tablet Take 40 mg by mouth daily.    Marland Kitchen loperamide (IMODIUM A-D) 2 MG tablet Take 4 mg by mouth See admin instructions. Take 2 tablets ( 4 mg) by mouth  after 1st loose stool as needed for diarrhea    . metoprolol tartrate (LOPRESSOR) 25 MG tablet Take 25 mg by mouth See admin instructions. Take one tablet (25 mg) by mouth twice daily on Tuesday, Thursday, Saturday and Sunday (non-dialysis days); take one tablet (25 mg) by mouth on Monday, Wednesday, Friday nights (dialysis days)    . midodrine (PROAMATINE) 10 MG tablet Take 10 mg by mouth See admin  instructions. Take one tablet (10 mg) by mouth on Monday, Wednesday, Friday 30 minutes prior to dialysis.    . Multiple Vitamin (MULTIVITAMIN WITH MINERALS) TABS tablet Take 1 tablet by mouth daily.    . pantoprazole (PROTONIX) 40 MG tablet Take 1 tablet (40 mg total) by mouth daily.  0  . warfarin (COUMADIN) 2 MG tablet Take 2 mg by mouth daily at 6 PM.      No current facility-administered medications for this visit.      PHYSICAL EXAM: Vitals:   03/14/19 1459  BP: 107/61  Pulse: 90  Resp: 18  Temp: 97.8 F (36.6 C)  SpO2: 98%  Weight: 116 lb (52.6 kg)    GENERAL: The patient is a well-nourished male, in no acute distress. The vital signs are documented above. His wound has excellent granulating base and is contracted to approximately 3 x 5 cm.  MEDICAL ISSUES: Open wound over the distal amputation site on the right above-knee amputation.  Will discontinue VAC which has been done and continue normal saline wet-to-dry dressings.  Follow-up with Dr. Donzetta Matters in 1 month   Rosetta Posner, MD [facility] Tel [phone] Pager [phone]

## 2019-03-24 ENCOUNTER — Encounter: Payer: Self-pay | Admitting: Adult Health

## 2019-03-24 ENCOUNTER — Non-Acute Institutional Stay (SKILLED_NURSING_FACILITY): Payer: Medicare Other | Admitting: Adult Health

## 2019-03-24 DIAGNOSIS — E1351 Other specified diabetes mellitus with diabetic peripheral angiopathy without gangrene: Secondary | ICD-10-CM

## 2019-03-24 DIAGNOSIS — I120 Hypertensive chronic kidney disease with stage 5 chronic kidney disease or end stage renal disease: Secondary | ICD-10-CM

## 2019-03-24 DIAGNOSIS — I132 Hypertensive heart and chronic kidney disease with heart failure and with stage 5 chronic kidney disease, or end stage renal disease: Secondary | ICD-10-CM | POA: Diagnosis not present

## 2019-03-24 DIAGNOSIS — E1122 Type 2 diabetes mellitus with diabetic chronic kidney disease: Secondary | ICD-10-CM

## 2019-03-24 DIAGNOSIS — Z89611 Acquired absence of right leg above knee: Secondary | ICD-10-CM

## 2019-03-24 DIAGNOSIS — D631 Anemia in chronic kidney disease: Secondary | ICD-10-CM

## 2019-03-24 DIAGNOSIS — Z89612 Acquired absence of left leg above knee: Secondary | ICD-10-CM

## 2019-03-24 DIAGNOSIS — I480 Paroxysmal atrial fibrillation: Secondary | ICD-10-CM

## 2019-03-24 DIAGNOSIS — I5032 Chronic diastolic (congestive) heart failure: Secondary | ICD-10-CM | POA: Diagnosis not present

## 2019-03-24 DIAGNOSIS — E1169 Type 2 diabetes mellitus with other specified complication: Secondary | ICD-10-CM

## 2019-03-24 DIAGNOSIS — E785 Hyperlipidemia, unspecified: Secondary | ICD-10-CM

## 2019-03-24 DIAGNOSIS — E43 Unspecified severe protein-calorie malnutrition: Secondary | ICD-10-CM

## 2019-03-24 DIAGNOSIS — N186 End stage renal disease: Secondary | ICD-10-CM

## 2019-03-24 DIAGNOSIS — Z992 Dependence on renal dialysis: Secondary | ICD-10-CM

## 2019-03-24 DIAGNOSIS — N2581 Secondary hyperparathyroidism of renal origin: Secondary | ICD-10-CM

## 2019-03-24 DIAGNOSIS — K219 Gastro-esophageal reflux disease without esophagitis: Secondary | ICD-10-CM

## 2019-03-24 NOTE — Progress Notes (Signed)
Location:    [place] Room Number: [phone] Place of Service:  SNF (31)   CODE STATUS: dnr  Allergies  Allergen Reactions  . Occlusive Silicone Sheets [Silicone] Other (See Comments)    Unknown reaction (listed as 'occlusive adhesive' on [facility] 02/15/2019)  . Other Other (See Comments)    Unknown reaction to Occlusive adhesive  . Tape Itching    Use Cloth tape only    Chief Complaint  Patient presents with  . 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 There are no reports of uncontrolled pain; no changes in appetite; no reports of anxiety or agitation. There are no reports of fevers present.   Past Medical History:  Diagnosis Date  . A-fib ([place])   . Anemia   . Blood transfusion   . BPH (benign prostatic hyperplasia)   . CHF (congestive heart failure) ([place])   . Diarrhea   . DM (diabetes mellitus) ([place])   . ESRD on hemodialysis ([place])    Started dialysis in 2009  . History of GI bleed    secondary to coumadin  . HTN (hypertension)   . Hyperlipidemia   . OSA (obstructive sleep apnea)    uses CPAP  . Secondary hyperparathyroidism of renal origin [facility])     Past Surgical History:  Procedure Laterality Date  . ABDOMINAL AORTOGRAM N/A 11/15/2018   Procedure: ABDOMINAL AORTOGRAM;  Surgeon: [name], MD;  Location: [place] CV LAB;  Service: Cardiovascular;  Laterality: N/A;  . AMPUTATION Left 12/06/2018   Procedure: AMPUTATION DIGIT LEFT FIFTH TOE;  Surgeon: Angelia Mould, MD;  Location: [place];  Service: Vascular;  Laterality: Left;  . AMPUTATION  Bilateral 12/29/2018   Procedure: AMPUTATION ABOVE KNEE;  Surgeon: Waynetta Sandy, MD;  Location: [place];  Service: Vascular;  Laterality: Bilateral;  . APPLICATION OF WOUND VAC Right 02/17/2019   Procedure: Application Of Wound Vac;  Surgeon: Marty Heck, MD;  Location: [place];  Service: Vascular;  Laterality: Right;  . BVT  6/37/85   Left  Basilic Vein Transposition  . CHOLECYSTECTOMY    . EYE SURGERY     Catarct bil  . I&D EXTREMITY Right 02/17/2019   Procedure: Right above the kneee debridement;  Surgeon: Marty Heck, MD;  Location: [place];  Service: Vascular;  Laterality: Right;  . INSERTION OF DIALYSIS CATHETER  05/28/2012   Procedure: INSERTION OF DIALYSIS CATHETER;  Surgeon: Mal Misty, MD;  Location: [place];  Service: Vascular;  Laterality: Right;  . Left arm shuntogram.    . Left forearm loop graft with 6 mm Gore-Tex graft.    . LOWER EXTREMITY ANGIOGRAPHY Bilateral 11/15/2018   Procedure: LOWER EXTREMITY ANGIOGRAPHY;  Surgeon: [name], MD;  Location: [place] CV LAB;  Service: Cardiovascular;  Laterality: Bilateral;  . Pars plana vitrectomy with 25-gauge system    . PERIPHERAL VASCULAR ATHERECTOMY Left 11/15/2018   Procedure: PERIPHERAL VASCULAR ATHERECTOMY;  Surgeon: [name], MD;  Location: [place] CV LAB;  Service: Cardiovascular;  Laterality: Left;  SFA with STENT  . PERIPHERAL VASCULAR BALLOON ANGIOPLASTY  Left 11/15/2018   Procedure: PERIPHERAL VASCULAR BALLOON ANGIOPLASTY;  Surgeon: [name], MD;  Location: [place] CV LAB;  Service: Cardiovascular;  Laterality: Left;  PT    Social History   Socioeconomic History  . Marital status: Divorced    Spouse name: Not on file  . Number of children: Not on file  . Years of education: Not on file  . Highest education level: Not on file  Occupational History  . Not on file  Social Needs  . Financial resource strain: Not on file  . Food insecurity:    Worry: Not on  file    Inability: Not on file  . Transportation needs:    Medical: Not on file    Non-medical: Not on file  Tobacco Use  . Smoking status: Former Smoker    Types: Cigarettes    Last attempt to quit: 06/30/2004    Years since quitting: 14.7  . Smokeless tobacco: Never Used  Substance and Sexual Activity  . Alcohol use: No  . Drug use: No  . Sexual activity: Never  Lifestyle  . Physical activity:    Days per week: Not on file    Minutes per session: Not on file  . Stress: Not on file  Relationships  . Social connections:    Talks on phone: Not on file    Gets together: Not on file    Attends religious service: Not on file    Active member of club or organization: Not on file    Attends meetings of clubs or organizations: Not on file    Relationship status: Not on file  . Intimate partner violence:    Fear of current or ex partner: Not on file    Emotionally abused: Not on file    Physically abused: Not on file    Forced sexual activity: Not on file  Other Topics Concern  . Not on file  Social History Narrative     The patient is a former smoker, quit is 2005.  Does not     drink or abuse drugs.  Currently, lives alone at home.         Family History  Problem Relation Age of Onset  . Alzheimer's disease Mother   . Diabetes Father        Amputation:  bilateral legs  . Cancer Daughter        breast cancer  . Diabetes Son   . Heart disease Son        before age 78  . Hypertension Son   . Anesthesia problems Neg Hx       VITAL SIGNS BP 138/74   Pulse 86   Temp 98 F (36.7 C)   Resp 18   Ht 5\' 6"  (1.676 m)   Wt 119 lb 3.2 oz (54.1 kg)   BMI 19.24 kg/m   Medication Sig  . acetaminophen (TYLENOL) 500 MG tablet Take 1,000 mg by mouth 3 (three) times daily.  . Amino Acids-Protein Hydrolys (FEEDING SUPPLEMENT, PRO-STAT SUGAR FREE 64,) LIQD Take 30 mLs by mouth 2 (two) times daily. 9a and 5p  . atorvastatin (LIPITOR) 20 MG tablet Take 1 tablet (20 mg total) by  mouth daily at 6 PM.  . B Complex-C-Folic Acid (RENAL VITAMIN PO) Take 1 tablet by mouth at bedtime.   Marland Kitchen doxercalciferol (HECTOROL) 4 MCG/2ML injection Inject 1 mL (2 mcg total) into the vein every Monday, Wednesday, and Friday with hemodialysis. (Patient taking differently: Inject 1  mcg into the vein every Monday, Wednesday, and Friday with hemodialysis. 0.5 ml)  . ferrous gluconate (FERGON) 240 (27 FE) MG tablet Take 1 tablet (240 mg total) by mouth 3 (three) times daily with meals.  . furosemide (LASIX) 40 MG tablet Take 40 mg by mouth daily.  Marland Kitchen loperamide (IMODIUM A-D) 2 MG tablet Take 4 mg by mouth See admin instructions. Take 2 tablets ( 4 mg) by mouth  after 1st loose stool as needed for diarrhea  . metoprolol tartrate (LOPRESSOR) 25 MG tablet Take 25 mg by mouth See admin instructions. Take one tablet (25 mg) by mouth twice daily on Tuesday, Thursday, Saturday and Sunday (non-dialysis days); take one tablet (25 mg) by mouth on Monday, Wednesday, Friday nights (dialysis days)  . midodrine (PROAMATINE) 10 MG tablet Take 10 mg by mouth See admin instructions. Take one tablet (10 mg) by mouth on Monday, Wednesday, Friday 30 minutes prior to dialysis.  . Multiple Vitamin (MULTIVITAMIN WITH MINERALS) TABS tablet Take 1 tablet by mouth daily.  . pantoprazole (PROTONIX) 40 MG tablet Take 1 tablet (40 mg total) by mouth daily.  Marland Kitchen warfarin (COUMADIN) 2.5 MG tablet Take 2.5 mg by mouth daily.  . [DISCONTINUED] warfarin (COUMADIN) 2 MG tablet Take 2 mg by mouth daily at 6 PM.    No facility-administered encounter medications on file as of 03/24/2019.      SIGNIFICANT DIAGNOSTIC EXAMS  LABS REVIEWED TODAY:   11-11-18: chol 113; ldl 62 trig 60; hdl 39  02-16-19: hgb a1c 4.9  02-27-19: wbc 10.4 hgb 10.1; hct 32 plt 228; glucose 99; bun 64; creat 5.4; k+ 4.6; na++ 141   Review of Systems  Unable to perform ROS: Dementia (unable to participate )   Physical Exam Constitutional:      General: He is  not in acute distress.    Appearance: He is well-developed. He is not diaphoretic.  Neck:     Musculoskeletal: Neck supple.     Thyroid: No thyromegaly.  Cardiovascular:     Rate and Rhythm: Normal rate. Rhythm irregular.     Heart sounds: Murmur present.     Comments: 2/6 Pulmonary:     Effort: Pulmonary effort is normal. No respiratory distress.     Breath sounds: Normal breath sounds.  Abdominal:     General: Bowel sounds are normal. There is no distension.     Palpations: Abdomen is soft.     Tenderness: There is no abdominal tenderness.  Musculoskeletal:     Comments: Is status post bilateral aka Able to move upper extremities   Lymphadenopathy:     Cervical: No cervical adenopathy.  Skin:    General: Skin is warm and dry.     Comments: Left arm dialysis access + thrill + bruit   Neurological:     Mental Status: He is alert. Mental status is at baseline.  Psychiatric:        Mood and Affect: Mood normal.      ASSESSMENT/ PLAN:  TODAY:   1. PAF(paroxysmal atrial fibrillation) heart rate is stable will continue lopressor 25 mg twice daily on Tues; Thurs; Sat and Sun with 25 mg daily on Mon Wed Fri for rate control; is on long term coumadin therapy.   2. Peripheral vascular disease due to secondary diabetes: is status post bilateral aka will monitor his status. Will continue tylenol 1 gm three times daily for pain management   3. Hypertensive heart and kidney disease with chronic diastolic congestive heart failure  and stage 5 chronic kidney disease on chronic dialysis : is stable b/p 138/74 will continue lopressor 25 mg twice daily Tues Thurs sat Sun and 25 mg daily on Mon Wed Fri  4.  Chronic diastolic congestive heart failure: is stable will continue lasix 40 mg daily   5. GERD without esophagitis: is stable will continue protonix 40 mg daily   6. Hyperlipidemia associated with type 2 diabetes mellitus: is stable LDL 62 will continue lipitor 20 mg daily   7. Type 2  diabetes mellitus with hypertension and end stage renal disease on dialysis: is stable hgb a1c 4.9 will monitor   8. 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 Anemia due to end stage renal disease: is stable hgb 10.1 will continue fergon 240 mg three times daily   11. Protein calorie malnutrition, severe: is stable weight is 119 pounds; albumin 2.1; will continue prostat 30 cc three times daily      MD is aware of resident's narcotic use and is in agreement with current plan of care. We will attempt to wean resident as apropriate   Ok Edwards NP [facility] Adult Medicine  Contact [phone] Monday through Friday 8am- 5pm  After hours call [phone]

## 2019-03-30 ENCOUNTER — Encounter: Payer: Self-pay | Admitting: Adult Health

## 2019-03-30 ENCOUNTER — Non-Acute Institutional Stay (SKILLED_NURSING_FACILITY): Payer: Medicare Other | Admitting: Adult Health

## 2019-03-30 DIAGNOSIS — Z7901 Long term (current) use of anticoagulants: Secondary | ICD-10-CM

## 2019-03-30 DIAGNOSIS — I480 Paroxysmal atrial fibrillation: Secondary | ICD-10-CM | POA: Diagnosis not present

## 2019-03-30 DIAGNOSIS — Z5181 Encounter for therapeutic drug level monitoring: Secondary | ICD-10-CM | POA: Diagnosis not present

## 2019-03-30 NOTE — Progress Notes (Signed)
Location:    [place] Room Number: [phone] Place of Service:  SNF (31)   CODE STATUS: dnr  Allergies  Allergen Reactions  . Occlusive Silicone Sheets [Silicone] Other (See Comments)    Unknown reaction (listed as 'occlusive adhesive' on [facility] 02/15/2019)  . Other Other (See Comments)    Unknown reaction to Occlusive adhesive  . Tape Itching    Use Cloth tape only     Chief Complaint  Patient presents with  . Acute Visit    INR mangement     HPI:   He is on long term coumadin therapy for the management of his afib. The INR goal is 2-3. There are no reports of any missed doses. No reports of abnormal bleeding. There are no reports of tachycardia; no reports of uncontrolled pain; no reports of fevers present   Past Medical History:  Diagnosis Date  . A-fib ([place])   . Anemia   . Blood transfusion   . BPH (benign prostatic hyperplasia)   . CHF (congestive heart failure) ([place])   . Diarrhea   . DM (diabetes mellitus) ([place])   . ESRD on hemodialysis ([place])    Started dialysis in 2009  . History of GI bleed    secondary to coumadin  . HTN (hypertension)   . Hyperlipidemia   . OSA (obstructive sleep apnea)    uses CPAP  . Secondary hyperparathyroidism of renal origin [facility])     Past Surgical History:  Procedure Laterality Date  . ABDOMINAL AORTOGRAM N/A 11/15/2018   Procedure: ABDOMINAL AORTOGRAM;  Surgeon: [name], MD;  Location: [place] CV LAB;  Service: Cardiovascular;  Laterality: N/A;  . AMPUTATION Left 12/06/2018   Procedure: AMPUTATION DIGIT LEFT FIFTH TOE;  Surgeon: Angelia Mould, MD;  Location: [place];  Service: Vascular;  Laterality: Left;  . AMPUTATION Bilateral 12/29/2018   Procedure: AMPUTATION ABOVE KNEE;  Surgeon: Waynetta Sandy, MD;  Location: [place];  Service: Vascular;  Laterality: Bilateral;  . APPLICATION OF WOUND VAC Right 02/17/2019   Procedure: Application Of Wound Vac;  Surgeon: Marty Heck, MD;  Location: [place];  Service: Vascular;  Laterality: Right;  . BVT  04/15/61   Left  Basilic Vein Transposition  . CHOLECYSTECTOMY    . EYE SURGERY     Catarct bil  . I&D EXTREMITY Right 02/17/2019   Procedure: Right above the kneee debridement;  Surgeon: Marty Heck, MD;  Location: [place];  Service: Vascular;  Laterality: Right;  . INSERTION OF DIALYSIS CATHETER  05/28/2012   Procedure: INSERTION OF DIALYSIS CATHETER;  Surgeon: Mal Misty, MD;  Location: [place];  Service: Vascular;  Laterality: Right;  . Left arm shuntogram.    . Left forearm loop graft with 6 mm Gore-Tex graft.    . LOWER EXTREMITY ANGIOGRAPHY Bilateral 11/15/2018   Procedure: LOWER EXTREMITY ANGIOGRAPHY;  Surgeon: [name], MD;  Location: [place] CV LAB;  Service: Cardiovascular;  Laterality: Bilateral;  . Pars plana vitrectomy with 25-gauge system    . PERIPHERAL VASCULAR ATHERECTOMY Left 11/15/2018   Procedure: PERIPHERAL VASCULAR ATHERECTOMY;  Surgeon: [name], MD;  Location: [place] CV LAB;  Service: Cardiovascular;  Laterality: Left;  SFA with STENT  . PERIPHERAL VASCULAR BALLOON ANGIOPLASTY Left 11/15/2018   Procedure: PERIPHERAL VASCULAR BALLOON ANGIOPLASTY;  Surgeon: [name], MD;  Location: [place] CV LAB;  Service: Cardiovascular;  Laterality: Left;  PT  Social History   Socioeconomic History  . Marital status: Divorced    Spouse name: Not on file  . Number of children: Not on file  . Years of education: Not on file  . Highest education level: Not on file  Occupational History  . Not on file  Social Needs  . Financial resource strain: Not on file  . Food insecurity:    Worry: Not on file    Inability: Not on file  . Transportation needs:    Medical: Not on file    Non-medical: Not on file  Tobacco Use  . Smoking status: Former Smoker    Types: Cigarettes    Last attempt to quit: 06/30/2004    Years since quitting: 14.7  . Smokeless  tobacco: Never Used  Substance and Sexual Activity  . Alcohol use: No  . Drug use: No  . Sexual activity: Never  Lifestyle  . Physical activity:    Days per week: Not on file    Minutes per session: Not on file  . Stress: Not on file  Relationships  . Social connections:    Talks on phone: Not on file    Gets together: Not on file    Attends religious service: Not on file    Active member of club or organization: Not on file    Attends meetings of clubs or organizations: Not on file    Relationship status: Not on file  . Intimate partner violence:    Fear of current or ex partner: Not on file    Emotionally abused: Not on file    Physically abused: Not on file    Forced sexual activity: Not on file  Other Topics Concern  . Not on file  Social History Narrative     The patient is a former smoker, quit is 2005.  Does not     drink or abuse drugs.  Currently, lives alone at home.         Family History  Problem Relation Age of Onset  . Alzheimer's disease Mother   . Diabetes Father        Amputation:  bilateral legs  . Cancer Daughter        breast cancer  . Diabetes Son   . Heart disease Son        before age 25  . Hypertension Son   . Anesthesia problems Neg Hx       VITAL SIGNS BP (!) 155/88   Pulse 86   Temp (!) 97 F (36.1 C)   Ht 5\' 6"  (1.676 m)   Wt 118 lb 3.2 oz (53.6 kg)   BMI 19.08 kg/m   Medication Sig  . acetaminophen (TYLENOL) 500 MG tablet Take 1,000 mg by mouth 3 (three) times daily.  . Amino Acids-Protein Hydrolys (FEEDING SUPPLEMENT, PRO-STAT SUGAR FREE 64,) LIQD Take 30 mLs by mouth 2 (two) times daily. 9a and 5p  . atorvastatin (LIPITOR) 20 MG tablet Take 1 tablet (20 mg total) by mouth daily at 6 PM.  . B Complex-C-Folic Acid (RENAL VITAMIN PO) Take 1 tablet by mouth at bedtime.   Marland Kitchen doxercalciferol (HECTOROL) 4 MCG/2ML injection Inject 1 mL (2 mcg total) into the vein every Monday, Wednesday, and Friday with hemodialysis. (Patient taking  differently: Inject 1 mcg into the vein every Monday, Wednesday, and Friday with hemodialysis. 0.5 ml)  . ferrous gluconate (FERGON) 240 (27 FE) MG tablet Take 1 tablet (240 mg total) by mouth 3 (three) times  daily with meals.  . furosemide (LASIX) 40 MG tablet Take 40 mg by mouth daily.  Marland Kitchen loperamide (IMODIUM A-D) 2 MG tablet Take 4 mg by mouth See admin instructions. Take 2 tablets ( 4 mg) by mouth  after 1st loose stool as needed for diarrhea  . metoprolol tartrate (LOPRESSOR) 25 MG tablet Take 25 mg by mouth See admin instructions. Take one tablet (25 mg) by mouth twice daily on Tuesday, Thursday, Saturday and Sunday (non-dialysis days); take one tablet (25 mg) by mouth on Monday, Wednesday, Friday nights (dialysis days)  . midodrine (PROAMATINE) 10 MG tablet Take 10 mg by mouth See admin instructions. Take one tablet (10 mg) by mouth on Monday, Wednesday, Friday 30 minutes prior to dialysis.  . Multiple Vitamin (MULTIVITAMIN WITH MINERALS) TABS tablet Take 1 tablet by mouth daily.  . pantoprazole (PROTONIX) 40 MG tablet Take 1 tablet (40 mg total) by mouth daily.  Marland Kitchen warfarin (COUMADIN) 2.5 MG tablet Take 2.5 mg by mouth daily.  . [DISCONTINUED] warfarin (COUMADIN) 2 MG tablet Take 2 mg by mouth daily at 6 PM.    No facility-administered encounter medications on file as of 03/24/2019.      SIGNIFICANT DIAGNOSTIC EXAMS  LABS REVIEWED PREVIOUS:   11-11-18: chol 113; ldl 62 trig 60; hdl 39  02-16-19: hgb a1c 4.9  02-27-19: wbc 10.4 hgb 10.1; hct 32 plt 228; glucose 99; bun 64; creat 5.4; k+ 4.6; na++ 141   TODAY:   03-30-19: INR 2.1     Review of Systems  Unable to perform ROS: Dementia (unable to participate )    Physical Exam Constitutional:      General: He is not in acute distress.    Appearance: He is well-developed. He is not diaphoretic.  Neck:     Thyroid: No thyromegaly.  Cardiovascular:     Rate and Rhythm: Normal rate. Rhythm irregular.     Heart sounds: Murmur present.      Comments: 2/6 Pulmonary:     Effort: Pulmonary effort is normal. No respiratory distress.     Breath sounds: Normal breath sounds.  Abdominal:     General: Bowel sounds are normal. There is no distension.     Palpations: Abdomen is soft.     Tenderness: There is no abdominal tenderness.  Musculoskeletal:     Comments: Is status post bilateral aka Able to move upper extremities    Lymphadenopathy:     Cervical: No cervical adenopathy.  Skin:    General: Skin is warm and dry.     Comments: Left arm dialysis access + thrill + bruit    Neurological:     Mental Status: He is alert. Mental status is at baseline.  Psychiatric:        Mood and Affect: Mood normal.        ASSESSMENT/ PLAN:   1.PAF(paroxysmal atrial fibrillation) heart rate is stable will continue lopressor 25 mg twice daily on Tues; Thurs; Sat and Sun with 25 mg on Mon Wed Fri for rate contol  2. Anticoagulation management encounter: stable INR 2.1 will continue coumadin 2.5 mg daily and will check INR in one week.      PAF(paroxysmal atrial fibrillation) heart rate is stable will continue lopressor 25 mg twice daily on Tues; Thurs; Sat and Sun with 25 mg daily on Mon Wed Fri for rate control; is on long term coumadin therapy.     MD is aware of resident's narcotic use and is in agreement with current  plan of care. We will attempt to wean resident as apropriate   Ok Edwards NP [facility] Adult Medicine  Contact [phone] Monday through Friday 8am- 5pm  After hours call [phone]

## 2019-04-17 ENCOUNTER — Encounter: Payer: Self-pay | Admitting: Internal Medicine

## 2019-04-17 ENCOUNTER — Non-Acute Institutional Stay (SKILLED_NURSING_FACILITY): Payer: Medicare Other | Admitting: Internal Medicine

## 2019-04-17 DIAGNOSIS — E1122 Type 2 diabetes mellitus with diabetic chronic kidney disease: Secondary | ICD-10-CM | POA: Diagnosis not present

## 2019-04-17 DIAGNOSIS — Z89611 Acquired absence of right leg above knee: Secondary | ICD-10-CM

## 2019-04-17 DIAGNOSIS — I119 Hypertensive heart disease without heart failure: Secondary | ICD-10-CM | POA: Diagnosis not present

## 2019-04-17 DIAGNOSIS — Z710 Person encountering health services to consult on behalf of another person: Secondary | ICD-10-CM

## 2019-04-17 DIAGNOSIS — I120 Hypertensive chronic kidney disease with stage 5 chronic kidney disease or end stage renal disease: Secondary | ICD-10-CM

## 2019-04-17 DIAGNOSIS — Z89612 Acquired absence of left leg above knee: Secondary | ICD-10-CM

## 2019-04-17 DIAGNOSIS — N186 End stage renal disease: Secondary | ICD-10-CM

## 2019-04-17 DIAGNOSIS — Z992 Dependence on renal dialysis: Secondary | ICD-10-CM

## 2019-04-17 NOTE — Progress Notes (Signed)
:  Location:  [facility] Room Number: [phone] Place of Service:  SNF (31)  Noah Delaine. [name], MD  Patient Care Team: Hennie Duos, MD as PCP - General (Internal Medicine) [name], MD as Consulting Physician (Nephrology) Rehab, [place] ([place], [facility]  Extended Emergency Contact Information Primary Emergency Contact: [name],[name] Address: [place], [place] [address] [name] of [place] Phone: [phone] Work Phone: [phone] Mobile Phone: [phone] Relation: Daughter Secondary Emergency Contact: [name] Mobile Phone: [phone] Relation: Other     Allergies: Occlusive silicone sheets [silicone]; Other; and Tape  Chief Complaint  Patient presents with   Acute Visit    HPI: Patient is 78 y.o. male who is being seen today in preparation for a team meeting with patient's daughter.  Patient has no complaints.  Patient will not be attending secondary to the 6 foot role that were maintaining inside of the nursing home.  Past Medical History:  Diagnosis Date   A-fib ([place])    Anemia    Blood transfusion    BPH (benign prostatic hyperplasia)    CHF (congestive heart failure) (HCC)    Diarrhea    DM (diabetes mellitus) ([place])    ESRD on hemodialysis ([place])    Started dialysis in 2009   History of GI bleed    secondary to coumadin   HTN (hypertension)    Hyperlipidemia    OSA (obstructive sleep apnea)    uses CPAP   Secondary hyperparathyroidism of renal origin [facility])     Past Surgical History:  Procedure Laterality Date   ABDOMINAL AORTOGRAM N/A 11/15/2018   Procedure: ABDOMINAL AORTOGRAM;  Surgeon: [name], MD;  Location: [place] CV LAB;  Service: Cardiovascular;  Laterality: N/A;   AMPUTATION Left 12/06/2018   Procedure: AMPUTATION DIGIT LEFT FIFTH TOE;  Surgeon: Angelia Mould, MD;  Location: [place];  Service: Vascular;  Laterality: Left;   AMPUTATION Bilateral 12/29/2018   Procedure: AMPUTATION ABOVE KNEE;  Surgeon: Waynetta Sandy, MD;  Location: [place];  Service: Vascular;  Laterality: Bilateral;   APPLICATION OF WOUND VAC Right 02/17/2019   Procedure: Application Of Wound Vac;  Surgeon: Marty Heck, MD;  Location: [place];  Service: Vascular;  Laterality: Right;   BVT  12/29/70   Left  Basilic Vein Transposition   CHOLECYSTECTOMY     EYE SURGERY     Catarct bil   I&D EXTREMITY Right 02/17/2019   Procedure: Right above the kneee debridement;  Surgeon: Marty Heck, MD;  Location: [place];  Service: Vascular;  Laterality: Right;   INSERTION OF DIALYSIS CATHETER  05/28/2012   Procedure: INSERTION OF DIALYSIS CATHETER;  Surgeon: Mal Misty, MD;  Location[facility] OR;  Service: Vascular;  Laterality: Right;   Left arm shuntogram.     Left forearm loop graft with 6 mm Gore-Tex graft.     LOWER EXTREMITY ANGIOGRAPHY Bilateral 11/15/2018   Procedure: LOWER EXTREMITY ANGIOGRAPHY;  Surgeon: [name], MD;  Location: [place] CV LAB;  Service: Cardiovascular;  Laterality: Bilateral;   Pars plana vitrectomy with 25-gauge system     PERIPHERAL VASCULAR ATHERECTOMY Left 11/15/2018   Procedure: PERIPHERAL VASCULAR ATHERECTOMY;  Surgeon: [name], MD;  Location: [place] CV LAB;  Service: Cardiovascular;  Laterality: Left;  SFA with STENT   PERIPHERAL VASCULAR BALLOON ANGIOPLASTY Left 11/15/2018  Procedure: PERIPHERAL VASCULAR BALLOON ANGIOPLASTY;  Surgeon: [name], MD;  Location: [place] CV LAB;  Service: Cardiovascular;  Laterality: Left;  PT    Allergies as of 04/17/2019      Reactions   Occlusive Silicone Sheets [silicone] Other (See Comments)   Unknown reaction (listed as 'occlusive adhesive' on [facility] 02/15/2019)   Other Other (See Comments)   Unknown reaction to Occlusive adhesive   Tape Itching   Use Cloth tape only        Medication List       Accurate as of April 17, 2019  4:22 PM. Always use your most recent med list.        acetaminophen 500 MG tablet Commonly known as:  TYLENOL Take 1,000 mg by mouth 3 (three) times daily.   atorvastatin 20 MG tablet Commonly known as:  LIPITOR Take 1 tablet (20 mg total) by mouth daily at 6 PM.   dicyclomine 10 MG capsule Commonly known as:  BENTYL Take 10 mg by mouth 3 (three) times daily before meals.   doxercalciferol 4 MCG/2ML injection Commonly known as:  HECTOROL Inject 1 mL (2 mcg total) into the vein every Monday, Wednesday, and Friday with hemodialysis.   feeding supplement (PRO-STAT SUGAR FREE 64) Liqd Take 30 mLs by mouth 2 (two) times daily. 9a and 5p   ferrous gluconate 240 (27 FE) MG tablet Commonly known as:  FERGON Take 1 tablet (240 mg total) by mouth 3 (three) times daily with meals.   furosemide 40 MG tablet Commonly known as:  LASIX Take 40 mg by mouth daily.   loperamide 2 MG tablet Commonly known as:  IMODIUM A-D Take 4 mg by mouth See admin instructions. Take 2 tablets ( 4 mg) by mouth  after 1st loose stool as needed for diarrhea   metoprolol tartrate 25 MG tablet Commonly known as:  LOPRESSOR Take 25 mg by mouth See admin instructions. Take one tablet (25 mg) by mouth twice daily on Tuesday, Thursday, Saturday and Sunday (non-dialysis days); take one tablet (25 mg) by mouth on Monday, Wednesday, Friday nights (dialysis days)   midodrine 10 MG tablet Commonly known as:  PROAMATINE Take 10 mg by mouth See admin instructions. Take one tablet (10 mg) by mouth on Monday, Wednesday, Friday 30 minutes prior to dialysis.   multivitamin with minerals Tabs tablet Take 1 tablet by mouth daily.   pantoprazole 40 MG tablet Commonly known as:  PROTONIX Take 1 tablet (40 mg total) by mouth daily.   RENAL VITAMIN PO Take 1 tablet by mouth at bedtime.   warfarin 2.5 MG tablet Commonly known as:  COUMADIN Take 2.5 mg by mouth  daily.       No orders of the defined types were placed in this encounter.    There is no immunization history on file for this patient.  Social History   Tobacco Use   Smoking status: Former Smoker    Types: Cigarettes    Last attempt to quit: 06/30/2004    Years since quitting: 14.8   Smokeless tobacco: Never Used  Substance Use Topics   Alcohol use: No    Family history is   Family History  Problem Relation Age of Onset   Alzheimer's disease Mother    Diabetes Father        Amputation:  bilateral legs   Cancer Daughter        breast cancer   Diabetes Son    Heart disease Son  before age 23   Hypertension Son    Anesthesia problems Neg Hx       Review of Systems  DATA OBTAINED: from patient, nurse GENERAL:  no fevers, fatigue, appetite changes SKIN: No itching, or rash EYES: No eye pain, redness, discharge EARS: No earache, tinnitus, change in hearing NOSE: No congestion, drainage or bleeding  MOUTH/THROAT: No mouth or tooth pain, No sore throat RESPIRATORY: No cough, wheezing, SOB CARDIAC: No chest pain, palpitations, lower extremity edema  GI: No abdominal pain, No N/V/D or constipation, No heartburn or reflux  GU: No dysuria, frequency or urgency, or incontinence  MUSCULOSKELETAL: No unrelieved bone/joint pain NEUROLOGIC: No headache, dizziness or focal weakness PSYCHIATRIC: No c/o anxiety or sadness   Vitals:   04/17/19 1608  BP: 108/60  Pulse: 77  Resp: 16  Temp: (!) 97.3 F (36.3 C)    SpO2 Readings from Last 1 Encounters:  03/14/19 98%   Body mass index is 20.27 kg/m.     Physical Exam  GENERAL APPEARANCE: Alert, moderately conversant,  No acute distress.  SKIN: No diaphoresis rash HEAD: Normocephalic, atraumatic  EYES: Conjunctiva/lids clear. Pupils round, reactive. EOMs intact.  EARS: External exam WNL, canals clear. Hearing grossly normal.  NOSE: No deformity or discharge.  MOUTH/THROAT: Lips w/o lesions    RESPIRATORY: Breathing is even, unlabored. Lung sounds are clear   CARDIOVASCULAR: Heart RRR no murmurs, rubs or gallops. No peripheral edema.   GASTROINTESTINAL: Abdomen is soft, non-tender, not distended w/ normal bowel sounds. GENITOURINARY: Bladder non tender, not distended  MUSCULOSKELETAL: Bilateral AKA NEUROLOGIC:  Cranial nerves 2-12 grossly intact. 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 ([place]) 03/24/2019   Anemia due to end stage renal disease ([place]) 03/24/2019   Protein-calorie malnutrition, severe ([place]) 03/24/2019   Hyperlipidemia associated with type 2 diabetes mellitus ([place]) 02/22/2019   Amputation stump infection ([place]) 02/15/2019   Conjunctivitis of left eye 01/04/2019   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[place]) 12/30/2018   Open wound of left foot    Anticoagulation management encounter    OSA (obstructive sleep apnea)    HTN (hypertension)    ESRD on hemodialysis (HCC)    BPH (benign prostatic hyperplasia)    Hypotension 12/07/2018   GERD (gastroesophageal reflux disease) 12/07/2018   Pressure injury of skin 12/03/2018   Symptomatic anemia 12/02/2018   Acute on chronic blood loss anemia 12/02/2018   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[place]) 12/02/2018   Lower GI bleeding 12/02/2018   Heme positive stool    Acute on chronic diastolic (congestive) heart failure ([place]) 11/19/2018   Acute CVA (cerebrovascular accident) ([place]) 11/19/2018   Hypoglycemia 11/19/2018   Peripheral vascular disease due to secondary diabetes ([place]) 11/19/2018   Dry  gangrene ([place]) 11/19/2018   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   DNR (do not resuscitate) discussion    Goals of care, counseling/discussion    Palliative care by specialist    Acute hypoxemic respiratory failure ([place]) 11/06/2018   Volume overload 11/06/2018   Multifocal pneumonia 78/67/6720   Acute metabolic encephalopathy 94/70/9628   Physical deconditioning 11/06/2018   Acute  respiratory failure with hypoxia and hypercapnia (HCC) 11/04/2018   CAP (community acquired pneumonia) 11/04/2018   Pleural effusion, right 11/04/2018   Increased ammonia level 11/04/2018   Abnormal CT of the abdomen 11/04/2018   Atrial fibrillation with RVR ([place]) 11/04/2018   Sepsis ([place]) 10/21/2018   Acute encephalopathy 10/21/2018   Elevated alkaline phosphatase level 10/21/2018   Chronic anemia 10/21/2018   Thrombocytopenia (HCC) 10/21/2018   Pulmonary HTN ([place]) 03/04/2017   Aortic valve stenosis 03/04/2017   Hypertension, accelerated with heart disease, without CHF 01/12/2017   Dyspnea on exertion 01/12/2017   ESRD (end stage renal disease) ([place]) 06/07/2012   Hyperlipidemia 04/24/2011   HYPERTENSION, BENIGN 10/24/2009   Type 2 diabetes mellitus with hypertension and end stage renal disease on dialysis ([place]) 10/23/2009   OBSTRUCTIVE SLEEP APNEA 10/23/2009   PAF (paroxysmal atrial fibrillation) ([place]) 10/23/2009   CHF (congestive heart failure) ([place]) 10/23/2009      Labs reviewed: Basic Metabolic Panel:    Component Value Date/Time   NA 141 02/27/2019   K 4.6 02/27/2019   CL 101 02/20/2019 1300   CO2 23 02/20/2019 1300   GLUCOSE 101 (H) 02/20/2019 1300   BUN 64 (A) 02/27/2019   CREATININE 5.4 (A) 02/27/2019   CREATININE 3.41 (H) 02/20/2019 1300   CALCIUM 7.7 (L) 02/20/2019 1300   CALCIUM 8.3 (L) 10/25/2008 1450   PROT 7.0 02/15/2019 1242   ALBUMIN 2.1 (L) 02/20/2019 1300   AST 30 02/15/2019 1242   ALT 25 02/15/2019 1242   ALKPHOS 198  (H) 02/15/2019 1242   BILITOT 0.9 02/15/2019 1242   GFRNONAA 16 (L) 02/20/2019 1300   GFRAA 19 (L) 02/20/2019 1300    Recent Labs    11/08/18 0016 11/09/18 0230  11/14/18 0241  01/02/19 1210  02/15/19 2242 02/16/19 0029 02/17/19 1456 02/20/19 1300 02/27/19  NA 138 137   < > 135   < > 137   < > 141 143 139 137 141  K 3.8 4.1   < > 5.0   < > 3.9   < > 4.8 3.8 4.0 3.0* 4.6  CL 100 100   < > 97*   < > 98   < > 102 103 101 101  --   CO2 26 27   < > 25   < > 22   < > 24 26 26 23   --   GLUCOSE 105* 94   < > 139*   < > 175*   < > 106* 104* 142* 101*  --   BUN 15 39*   < > 72*   < > 64*   < > 58* 56* 23 27* 64*  CREATININE 3.19* 5.34*   < > 7.61*   < > 8.36*   < > 6.50* 6.64* 4.46* 3.41* 5.4*  CALCIUM 8.8* 9.1   < > 9.8   < > 7.8*   < > 9.0 9.1 9.0 7.7*  --   MG 2.0 2.2  --  2.4  --   --   --   --   --   --   --   --   PHOS  --   --    < > 2.4*   < > 6.2*  --  4.0  --   --  2.4*  --    < > = values in this interval not displayed.   Liver Function Tests: Recent Labs    12/01/18 2353  12/23/18 1128  02/15/19 1242 02/15/19  2242 02/20/19 1300  AST 195*  --  26  --  30  --   --   ALT 231*  --  20  --  25  --   --   ALKPHOS 293*  --  290*  --  198*  --   --   BILITOT 1.1  --  0.9  --  0.9  --   --   PROT 6.6  --  7.0  --  7.0  --   --   ALBUMIN 2.9*   < > 2.4*   < > 2.2* 2.1* 2.1*   < > = values in this interval not displayed.   Recent Labs    10/21/18 0520 10/24/18 0614  LIPASE 35 32   Recent Labs    10/26/18 0538 11/06/18 1850 11/09/18 0230  AMMONIA 42* 40* 42*   CBC: Recent Labs    12/23/18 1128 12/24/18 0227  02/15/19 1242  02/16/19 0029 02/17/19 1456 02/20/19 1300 02/27/19  WBC 17.9* 15.6*   < > 14.7*   < > 11.7* 10.1 11.1* 10.4  NEUTROABS 15.5* 13.0*  --  11.8*  --   --   --   --   --   HGB 9.5* 10.5*   < > 10.1*   < > 10.2* 11.2* 9.4* 10.1*  HCT 31.7* 33.5*   < > 35.1*   < > 34.9* 37.9* 32.6* 32*  MCV 100.6* 99.4   < > 90.5   < > 89.7 87.5 88.1  --   PLT  223 204   < > 227   < > 215 229 207 228   < > = values in this interval not displayed.   Lipid Recent Labs    11/11/18 1517  CHOL 113  HDL 39*  LDLCALC 62  TRIG 60    Cardiac Enzymes: Recent Labs    11/06/18 1855  TROPONINI <0.03   BNP: Recent Labs    11/06/18 1905  BNP 1,611.6*   No results found for: [facility] Lab Results  Component Value Date   HGBA1C 4.9 02/16/2019   Lab Results  Component Value Date   TSH 1.956 11/09/2018   Lab Results  Component Value Date   EVOJJKKX38 182 11/11/2018   Lab Results  Component Value Date   FOLATE  10/17/2008    >20.0 (NOTE)  Reference Ranges        Deficient:       0.4 - 3.3 ng/mL        Indeterminate:   3.4 - 5.4 ng/mL        Normal:              > 5.4 ng/mL   Lab Results  Component Value Date   IRON 48 06/05/2011   TIBC 194 (L) 06/05/2011   FERRITIN 872 (H) 06/05/2011    Imaging and Procedures obtained prior to SNF admission: Dg Chest 1 View  Result Date: 02/15/2019 CLINICAL DATA:  Bleeding and drainage at femur amputation site. Possible infection. EXAM: CHEST  1 VIEW COMPARISON:  12/27/2018 FINDINGS: Grossly unchanged enlarged cardiac silhouette and mediastinal contours given supine positioning. Atherosclerotic plaque when the thoracic aorta. The pulmonary vasculature appears less distinct than present examination with cephalization of flow. There is mild elevation/eventration of right hemidiaphragm. No new discrete focal airspace opacities. No supine evidence pleural effusion or pneumothorax. No acute osseous abnormalities. IMPRESSION: Cardiomegaly with suspected mild pulmonary edema on this AP supine portable examination. Further evaluation with a PA  and lateral chest radiograph may be obtained as clinically indicated. Electronically Signed   By: Sandi Mariscal M.D.   On: 02/15/2019 13:46   Mr Frmur Right Wo Contrast  Result Date: 02/15/2019 CLINICAL DATA:  Right AKA stump infection. Recent bilateral AKAs on  12/29/2018. EXAM: MRI OF THE RIGHT FEMUR WITHOUT CONTRAST TECHNIQUE: Multiplanar, multisequence MR imaging of the right femur was performed. No intravenous contrast was administered. COMPARISON:  Right femur x-rays from same day. FINDINGS: Limited examination due to motion artifact and SAR restrictions given history of left SFA stent. Bones/Joint/Cartilage Prior bilateral above knee amputations. No suspicious marrow signal abnormality. Unchanged 1.8 cm well-defined, T2 hyperintense lesion in the right femoral head without surrounding marrow edema. No fracture or dislocation. Normal alignment. No hip joint effusion. Muscles and Tendons Edema within the vastus lateralis and intermedius muscles. Soft tissue Ill-defined 1.7 x 4.2 x 4.2 cm complex fluid collection in the soft tissues of the right amputation stump. This appears to communicate with the skin surface. There is an additional 2.8 x 5.8 x 5.4 cm crescentic, complex fluid collection draped over the distal aspect of the residual femoral diaphysis, which may communicate with the other fluid collection. There is a small amount of soft tissue edema in the left amputation stump site without discrete fluid collection. IMPRESSION: 1. Complex fluid collections in the soft tissues of the right amputation stump and draped over the distal aspect of the residual femoral diaphysis, concerning for abscess. These may communicate. CT of the right thigh with contrast may be useful for further evaluation given limitations of this study as described above. 2. Prior bilateral above knee amputations. No evidence of osteomyelitis. 3. Nonspecific right vastus lateralis and intermedius myositis, potentially infectious in etiology. 4. Unchanged 1.8 cm non-aggressive appearing lesion in the right femoral head. This demonstrated no uptake on recent bone scan and may represent a geode or intraosseous ganglion cyst. Electronically Signed   By: Titus Dubin M.D.   On: 02/15/2019 17:03    Dg Femur Min 2 Views Right  Result Date: 02/15/2019 CLINICAL DATA:  Bleeding and drainage at site of amputation. Evaluate for infection. EXAM: RIGHT FEMUR 2 VIEWS COMPARISON:  None. FINDINGS: Post right above knee amputation. Scattered foci of subcutaneous emphysema about the distal aspect of the residual limb. No radiopaque foreign body. No discrete areas of osteolysis to suggest osteomyelitis. Vascular calcifications. Limited visualization of the adjacent hip and lower pelvis is normal. IMPRESSION: 1. Scattered foci of subcutaneous emphysema about the distal aspect the residual limb, nonspecific though in the absence of recent intervention, a gas producing organism could have resulted in a similar appearance. Clinical correlation is advised. Further evaluation with MRI could be performed as indicated. 2. No radiographic evidence of osteomyelitis involving the residual femur. Electronically Signed   By: Sandi Mariscal M.D.   On: 02/15/2019 13:45     Not all labs, radiology exams or other studies done during hospitalization come through on my EPIC note; however they are reviewed by me.    Assessment and Plan  End-stage renal disease on dialysis/bilateral AKA/diabetes type 2/hypertension/encounter for family conference- in attendance e, wound care [place], social work Junie Panning, [place], Elder [place], activities [facility], dietary and myself speaking with patient's daughter and power of attorney.  Many questions were asked and answered by all parties.  For myself I discussed all the protections that the skilled nursing home [facility] affect to protect patients from COVID-19.  I also confirmed with patient's daughter that patient is a DNR  and in the event of a COVID like infection patient would not wish to go to the hospital, patient does not wish to be intubated.  All questions were answered to daughter satisfaction and meeting was ended.   Time spent greater than 35 minutes Kambrie Eddleman D. [name], MD

## 2019-04-18 ENCOUNTER — Encounter: Payer: Self-pay | Admitting: Internal Medicine

## 2019-04-21 ENCOUNTER — Ambulatory Visit (INDEPENDENT_AMBULATORY_CARE_PROVIDER_SITE_OTHER): Payer: Self-pay | Admitting: Vascular Surgery

## 2019-04-21 ENCOUNTER — Other Ambulatory Visit: Payer: Self-pay

## 2019-04-21 ENCOUNTER — Encounter: Payer: Self-pay | Admitting: Vascular Surgery

## 2019-04-21 ENCOUNTER — Encounter: Payer: Self-pay | Admitting: Internal Medicine

## 2019-04-21 ENCOUNTER — Non-Acute Institutional Stay (SKILLED_NURSING_FACILITY): Payer: Medicare Other | Admitting: Internal Medicine

## 2019-04-21 VITALS — BP 116/64 | HR 85 | Temp 98.3°F | Resp 20 | Ht 66.0 in | Wt 125.0 lb

## 2019-04-21 DIAGNOSIS — I779 Disorder of arteries and arterioles, unspecified: Secondary | ICD-10-CM

## 2019-04-21 DIAGNOSIS — I272 Pulmonary hypertension, unspecified: Secondary | ICD-10-CM | POA: Diagnosis not present

## 2019-04-21 DIAGNOSIS — E1351 Other specified diabetes mellitus with diabetic peripheral angiopathy without gangrene: Secondary | ICD-10-CM

## 2019-04-21 DIAGNOSIS — G4733 Obstructive sleep apnea (adult) (pediatric): Secondary | ICD-10-CM | POA: Diagnosis not present

## 2019-04-21 NOTE — Progress Notes (Signed)
    Subjective:     Patient ID: [name], male   DOB: 03-18-1941, 79 y.o.   MRN: [id number]  HPI 78 year old male follows up for evaluation of right above-knee amputation wound.  Amputations performed beginning with January.  Last evaluated in our office over 1 month ago.  He did undergo debridement wound VAC placement of the right above-knee site.  Left side is healed.  Having some pain in the right amputation site but otherwise okay no fevers chills.   Review of Systems Right residual limb pain    Objective:   Physical Exam Vitals:   04/21/19 1002  BP: 116/64  Pulse: 85  Resp: 20  Temp: 98.3 F (36.8 C)  SpO2: 96%   Awake alert and oriented Right above-knee amputation site with 1 cm of hypertrophic granulation silver nitrate applied.    Assessment/plan     78 year old male status post bilateral above-knee amputations with the left side well-healed right side nearly healed.  Silver nitrate applied to the granulation today.  He can follow-up on an as-needed basis.    Jenella Craigie C. Donzetta Matters, MD [facility] Office: [phone] Pager: [phone]

## 2019-04-21 NOTE — Progress Notes (Signed)
Location:  [facility] Room Number: [phone] Place of Service:  SNF [phone])  [name]. [name], MD  Patient Care Team: Edward Duos, MD as PCP - General (Internal Medicine) Edward Contras, MD as Consulting Physician (Nephrology) Rehab, [name] ([name], [name]  Extended Emergency Contact Information Primary Emergency Contact: [name],[name] Address: [place], [place] [address] [name] of [name] Phone: [phone] Work Phone: [phone] Mobile Phone: [phone] Relation: Daughter Secondary Emergency Contact: [name] Mobile Phone: [phone] Relation: Other    Allergies: Occlusive silicone sheets [silicone]; Other; and Tape  Chief Complaint  Patient presents with  . Medical Management of Chronic Issues    Routine visit    HPI: Patient is 78 y.o. male who is being seen for routine issues of pulmonary hypertension, PAD, and OSA.  Past Medical History:  Diagnosis Date  . A-fib ([name])   . Anemia   . Blood transfusion   . BPH (benign prostatic hyperplasia)   . CHF (congestive heart failure) ([place])   . Diarrhea   . DM (diabetes mellitus) ([place])   . ESRD on hemodialysis ([place])    Started dialysis in 2009  . History of GI bleed    secondary to coumadin  . HTN (hypertension)   . Hyperlipidemia   . OSA (obstructive sleep apnea)    uses CPAP  . Secondary hyperparathyroidism of renal origin [facility])     Past Surgical History:  Procedure Laterality Date  . ABDOMINAL AORTOGRAM N/A 11/15/2018   Procedure: ABDOMINAL AORTOGRAM;  Surgeon: Edward Mitchell, MD;  Location: [facility];  Service: Cardiovascular;  Laterality: N/A;  . AMPUTATION Left 12/06/2018   Procedure: AMPUTATION DIGIT LEFT FIFTH TOE;  Surgeon: Edward Mould, MD;  Location: [facility];  Service: Vascular;  Laterality: Left;  . AMPUTATION Bilateral 12/29/2018   Procedure: AMPUTATION  ABOVE KNEE;  Surgeon: Edward Sandy, MD;  Location: [facility];  Service: Vascular;  Laterality: Bilateral;  . APPLICATION OF WOUND VAC Right 02/17/2019   Procedure: Application Of Wound Vac;  Surgeon: Edward Heck, MD;  Location: [facility];  Service: Vascular;  Laterality: Right;  . BVT  2/45/80   Left  Basilic Vein Transposition  . CHOLECYSTECTOMY    . EYE SURGERY     Catarct bil  . I&D EXTREMITY Right 02/17/2019   Procedure: Right above the kneee debridement;  Surgeon: Edward Heck, MD;  Location: [facility];  Service: Vascular;  Laterality: Right;  . INSERTION OF DIALYSIS CATHETER  05/28/2012   Procedure: INSERTION OF DIALYSIS CATHETER;  Surgeon: Edward Misty, MD;  Location: [facility];  Service: Vascular;  Laterality: Right;  . Left arm shuntogram.    . Left forearm loop graft with 6 mm Gore-Tex graft.    . LOWER EXTREMITY ANGIOGRAPHY Bilateral 11/15/2018   Procedure: LOWER EXTREMITY ANGIOGRAPHY;  Surgeon: Edward Mitchell, MD;  Location: [facility];  Service: Cardiovascular;  Laterality: Bilateral;  . Pars plana vitrectomy with 25-gauge system    . PERIPHERAL VASCULAR ATHERECTOMY Left 11/15/2018   Procedure: PERIPHERAL VASCULAR ATHERECTOMY;  Surgeon: Edward Mitchell, MD;  Location: [facility];  Service: Cardiovascular;  Laterality: Left;  SFA with STENT  . PERIPHERAL VASCULAR BALLOON ANGIOPLASTY Left 11/15/2018   Procedure: PERIPHERAL VASCULAR BALLOON ANGIOPLASTY;  Surgeon: Edward Mitchell, MD;  Location: [facility];  Service: Cardiovascular;  Laterality: Left;  PT    Allergies as of 04/21/2019      Reactions   Occlusive Silicone Sheets [silicone] Other (See Comments)   Unknown reaction (listed as 'occlusive adhesive' on [facility] 02/15/2019)   Other Other (See Comments)   Unknown reaction to Occlusive adhesive   Tape Itching   Use Cloth tape only      Medication List       Accurate as of April 21, 2019 11:59 PM. Always use your most recent med list.         acetaminophen 500 MG tablet Commonly known as:  TYLENOL Take 1,000 mg by mouth 3 (three) times daily.   atorvastatin 20 MG tablet Commonly known as:  LIPITOR Take 1 tablet (20 mg total) by mouth daily at 6 PM.   dicyclomine 10 MG capsule Commonly known as:  BENTYL Take 10 mg by mouth 3 (three) times daily before meals.   doxercalciferol 4 MCG/2ML injection Commonly known as:  HECTOROL Inject 1 mL (2 mcg total) into the vein every Monday, Wednesday, and Friday with hemodialysis.   feeding supplement (PRO-STAT SUGAR FREE 64) Liqd Take 30 mLs by mouth 2 (two) times daily. 9a and 5p   ferrous gluconate 240 (27 FE) MG tablet Commonly known as:  FERGON Take 1 tablet (240 mg total) by mouth 3 (three) times daily with meals.   furosemide 40 MG tablet Commonly known as:  LASIX Take 40 mg by mouth daily.   loperamide 2 MG tablet Commonly known as:  IMODIUM A-D Take 4 mg by mouth See admin instructions. Take 2 tablets ( 4 mg) by mouth  after 1st loose stool as needed for diarrhea   metoprolol tartrate 25 MG tablet Commonly known as:  LOPRESSOR Take 25 mg by mouth See admin instructions. Take one tablet (25 mg) by mouth twice daily on Tuesday, Thursday, Saturday and Sunday (non-dialysis days); take one tablet (25 mg) by mouth on Monday, Wednesday, Friday nights (dialysis days)   midodrine 10 MG tablet Commonly known as:  PROAMATINE Take 10 mg by mouth See admin instructions. Take one tablet (10 mg) by mouth on Monday, Wednesday, Friday 30 minutes prior to dialysis.   multivitamin with minerals Tabs tablet Take 1 tablet by mouth daily.   pantoprazole 40 MG tablet Commonly known as:  PROTONIX Take 1 tablet (40 mg total) by mouth daily.   RENAL VITAMIN PO Take 1 tablet by mouth at bedtime.   warfarin 2.5 MG tablet Commonly known as:  COUMADIN Take 2.5 mg by mouth daily.       No orders of the defined types were placed in this encounter.    There is no  immunization history on file for this patient.  Social History   Tobacco Use  . Smoking status: Former Smoker    Types: Cigarettes    Last attempt to quit: 06/30/2004    Years since quitting: 14.8  . Smokeless tobacco: Never Used  Substance Use Topics  . Alcohol use: No    Review of Systems  DATA OBTAINED: from patient, nurse GENERAL:  no fevers, fatigue, appetite changes SKIN: No itching, rash HEENT: No complaint RESPIRATORY: No cough, wheezing, SOB CARDIAC: No chest pain, palpitations, lower extremity edema  GI: No abdominal pain, No N/V/D or constipation, No heartburn or reflux  GU: No dysuria, frequency or urgency, or incontinence  MUSCULOSKELETAL: No unrelieved bone/joint pain NEUROLOGIC: No headache, dizziness  PSYCHIATRIC: No overt anxiety or sadness  Vitals:   04/21/19 1154  BP: (!) 114/58  Pulse: 74  Resp: 20  Temp: 98.2 F (36.8 C)   Body mass index is 20.27 kg/m. Physical Exam  GENERAL APPEARANCE: Alert, minimally conversant, No acute distress  SKIN: No diaphoresis rash HEENT: Unremarkable RESPIRATORY: Breathing is even, unlabored. CARDIOVASCULAR:  No peripheral edema  GASTROINTESTINAL: abd not distended w/ normal bowel sounds.  GENITOURINARY- bladder not distended  MUSCULOSKELETAL: Bilateral AKA NEUROLOGIC: Cranial nerves 2-12 grossly intact. Moves all extremities PSYCHIATRIC: Mood and affect appropriate to situation, no behavioral issues  Patient Active Problem List   Diagnosis Date Noted  . 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 End stage renal disease on dialysis due to type 2 diabetes mellitus ([place]) 03/24/2019  . 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 Anemia due to end stage renal disease ([place]) 03/24/2019  . Protein-calorie malnutrition, severe ([place]) 03/24/2019  . Hyperlipidemia associated with type 2  diabetes mellitus ([place]) 02/22/2019  . Amputation stump infection ([place]) 02/15/2019  . Conjunctivitis of left eye 01/04/2019  . 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[place]) 12/30/2018  . Open wound of left foot   . Anticoagulation management encounter   . OSA (obstructive sleep apnea)   . HTN (hypertension)   . ESRD on hemodialysis ([place])   . BPH (benign prostatic hyperplasia)   . Hypotension 12/07/2018  . GERD (gastroesophageal reflux disease) 12/07/2018  . Pressure injury of skin 12/03/2018  . Symptomatic anemia 12/02/2018  . Acute on chronic blood loss anemia 12/02/2018  . 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[place]) 12/02/2018  . Lower GI bleeding 12/02/2018  . Heme positive stool   . Acute on chronic diastolic (congestive) heart failure ([place]) 11/19/2018  . Acute CVA (cerebrovascular accident) ([place]) 11/19/2018  . Hypoglycemia 11/19/2018  . Peripheral vascular disease due to secondary diabetes ([place]) 11/19/2018  . Dry gangrene ([place]) 11/19/2018  . 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 DNR (do not resuscitate) discussion   . Goals of care, counseling/discussion   . Palliative care by specialist   . Acute hypoxemic respiratory failure ([place]) 11/06/2018  . Volume overload 11/06/2018  . Multifocal pneumonia 11/06/2018  . Acute metabolic encephalopathy 43/15/4008  . Physical deconditioning 11/06/2018  . Acute respiratory failure with hypoxia and hypercapnia ([place]) 11/04/2018  . CAP (community acquired pneumonia) 11/04/2018  . Pleural effusion, right 11/04/2018  . Increased ammonia level 11/04/2018  . Abnormal CT of the abdomen 11/04/2018  . Atrial fibrillation with RVR ([place]) 11/04/2018  . Sepsis ([place]) 10/21/2018  . Acute encephalopathy 10/21/2018  . Elevated alkaline phosphatase level 10/21/2018  . Chronic anemia 10/21/2018  . Thrombocytopenia ([place]) 10/21/2018  . Pulmonary HTN ([place]) 03/04/2017  . Aortic valve stenosis 03/04/2017  . Hypertension, accelerated with  heart disease, without CHF 01/12/2017  . Dyspnea on exertion 01/12/2017  . ESRD (end stage renal disease) ([place]) 06/07/2012  . Hyperlipidemia 04/24/2011  . HYPERTENSION, BENIGN 10/24/2009  . Type 2 diabetes mellitus with hypertension and end stage renal disease on dialysis ([place]) 10/23/2009  . OBSTRUCTIVE SLEEP APNEA 10/23/2009  . PAF (paroxysmal atrial fibrillation) ([place]) 10/23/2009  . CHF (congestive heart failure) ([place]) 10/23/2009    CMP     Component Value Date/Time   NA 141 02/27/2019   K 4.6 02/27/2019   CL 101 02/20/2019 1300   CO2 23 02/20/2019 1300   GLUCOSE 101 (H) 02/20/2019 1300   BUN 64 (A) 02/27/2019  CREATININE 5.4 (A) 02/27/2019   CREATININE 3.41 (H) 02/20/2019 1300   CALCIUM 7.7 (L) 02/20/2019 1300   CALCIUM 8.3 (L) 10/25/2008 1450   PROT 7.0 02/15/2019 1242   ALBUMIN 2.1 (L) 02/20/2019 1300   AST 30 02/15/2019 1242   ALT 25 02/15/2019 1242   ALKPHOS 198 (H) 02/15/2019 1242   BILITOT 0.9 02/15/2019 1242   GFRNONAA 16 (L) 02/20/2019 1300   GFRAA 19 (L) 02/20/2019 1300   Recent Labs    11/08/18 0016 11/09/18 0230  11/14/18 0241  01/02/19 1210  02/15/19 2242 02/16/19 0029 02/17/19 1456 02/20/19 1300 02/27/19  NA 138 137   < > 135   < > 137   < > 141 143 139 137 141  K 3.8 4.1   < > 5.0   < > 3.9   < > 4.8 3.8 4.0 3.0* 4.6  CL 100 100   < > 97*   < > 98   < > 102 103 101 101  --   CO2 26 27   < > 25   < > 22   < > 24 26 26 23   --   GLUCOSE 105* 94   < > 139*   < > 175*   < > 106* 104* 142* 101*  --   BUN 15 39*   < > 72*   < > 64*   < > 58* 56* 23 27* 64*  CREATININE 3.19* 5.34*   < > 7.61*   < > 8.36*   < > 6.50* 6.64* 4.46* 3.41* 5.4*  CALCIUM 8.8* 9.1   < > 9.8   < > 7.8*   < > 9.0 9.1 9.0 7.7*  --   MG 2.0 2.2  --  2.4  --   --   --   --   --   --   --   --   PHOS  --   --    < > 2.4*   < > 6.2*  --  4.0  --   --  2.4*  --    < > = values in this interval not displayed.   Recent Labs    12/01/18 2353  12/23/18 1128  02/15/19 1242 02/15/19  2242 02/20/19 1300  AST 195*  --  26  --  30  --   --   ALT 231*  --  20  --  25  --   --   ALKPHOS 293*  --  290*  --  198*  --   --   BILITOT 1.1  --  0.9  --  0.9  --   --   PROT 6.6  --  7.0  --  7.0  --   --   ALBUMIN 2.9*   < > 2.4*   < > 2.2* 2.1* 2.1*   < > = values in this interval not displayed.   Recent Labs    12/23/18 1128 12/24/18 0227  02/15/19 1242  02/16/19 0029 02/17/19 1456 02/20/19 1300 02/27/19  WBC 17.9* 15.6*   < > 14.7*   < > 11.7* 10.1 11.1* 10.4  NEUTROABS 15.5* 13.0*  --  11.8*  --   --   --   --   --   HGB 9.5* 10.5*   < > 10.1*   < > 10.2* 11.2* 9.4* 10.1*  HCT 31.7* 33.5*   < > 35.1*   < > 34.9* 37.9* 32.6* 32*  MCV  100.6* 99.4   < > 90.5   < > 89.7 87.5 88.1  --   PLT 223 204   < > 227   < > 215 229 207 228   < > = values in this interval not displayed.   Recent Labs    11/11/18 1517  CHOL 113  LDLCALC 62  TRIG 60   No results found for: [facility] Lab Results  Component Value Date   TSH 1.956 11/09/2018   Lab Results  Component Value Date   HGBA1C 4.9 02/16/2019   Lab Results  Component Value Date   CHOL 113 11/11/2018   HDL 39 (L) 11/11/2018   LDLCALC 62 11/11/2018   TRIG 60 11/11/2018   CHOLHDL 2.9 11/11/2018    Significant Diagnostic Results in last 30 days:  No results found.  Assessment and Plan  Pulmonary HTN ([place]) No apparent problems; continue Lasix 40 mg daily  Peripheral vascular disease due to secondary diabetes ([place]) Resulting in bilateral AKA; patient is doing well and is on Coumadin  OSA (obstructive sleep apnea) No excessive daytime sleepiness noted; continue CPAP    Zyria Fiscus D. [name], MD

## 2019-04-23 ENCOUNTER — Encounter: Payer: Self-pay | Admitting: Internal Medicine

## 2019-04-23 NOTE — Assessment & Plan Note (Signed)
No apparent problems; continue Lasix 40 mg daily

## 2019-04-23 NOTE — Assessment & Plan Note (Signed)
Resulting in bilateral AKA; patient is doing well and is on Coumadin

## 2019-04-23 NOTE — Assessment & Plan Note (Signed)
No excessive daytime sleepiness noted; continue CPAP

## 2019-04-27 ENCOUNTER — Encounter: Payer: Self-pay | Admitting: Internal Medicine

## 2019-04-27 ENCOUNTER — Non-Acute Institutional Stay (SKILLED_NURSING_FACILITY): Payer: Medicare Other | Admitting: Internal Medicine

## 2019-04-27 DIAGNOSIS — R791 Abnormal coagulation profile: Secondary | ICD-10-CM

## 2019-04-27 DIAGNOSIS — I480 Paroxysmal atrial fibrillation: Secondary | ICD-10-CM | POA: Diagnosis not present

## 2019-04-27 NOTE — Progress Notes (Signed)
: Provider:  Noah [name]. Edward Coil, MD Location:  [facility] Room Number: [phone] Place of Service:  SNF [phone])  PCP: Edward Duos, MD Patient Care Team: Edward Duos, MD as PCP - General (Internal Medicine) Edward Contras, MD as Consulting Physician (Nephrology) Rehab, [facility], [facility]  Extended Emergency Contact Information Primary Emergency Contact: [name],[name] Address: [place], [place] [address] [name] of [place] Phone: [phone] Work Phone: [phone] Mobile Phone: [phone] Relation: Daughter Secondary Emergency Contact: [name] Mobile Phone: [phone] Relation: Other     Allergies: Occlusive silicone sheets [silicone]; Other; and Tape  Chief Complaint  Patient presents with   Acute Visit    HPI: Patient is 78 y.o. male who is being seen for subtherapeutic INR.  Today patient's INR is 1.7, with a goal of 2-3.  Patient received 2.5 mg daily.  Patient is on no new medications, patient cannot tell us if is anything different on his diet.  Past Medical History:  Diagnosis Date   A-fib ([place])    Anemia    Blood transfusion    BPH (benign prostatic hyperplasia)    CHF (congestive heart failure) (HCC)    Diarrhea    DM (diabetes mellitus) ([place])    ESRD on hemodialysis ([place])    Started dialysis in 2009   History of GI bleed    secondary to coumadin   HTN (hypertension)    Hyperlipidemia    OSA (obstructive sleep apnea)    uses CPAP   Secondary hyperparathyroidism of renal origin [facility])     Past Surgical History:  Procedure Laterality Date   ABDOMINAL AORTOGRAM N/A 11/15/2018   Procedure: ABDOMINAL AORTOGRAM;  Surgeon: [name], MD;  Location: [place] [facility];  Service: Cardiovascular;  Laterality: N/A;   AMPUTATION Left 12/06/2018   Procedure: AMPUTATION DIGIT LEFT FIFTH TOE;  Surgeon: Angelia Mould, MD;  Location: [place];  Service: Vascular;  Laterality: Left;   AMPUTATION Bilateral 12/29/2018   Procedure: AMPUTATION ABOVE KNEE;  Surgeon: Waynetta Sandy, MD;  Location: [place];  Service: Vascular;  Laterality: Bilateral;   APPLICATION OF WOUND VAC Right 02/17/2019   Procedure: Application Of Wound Vac;  Surgeon: Marty Heck, MD;  Location: [place];  Service: Vascular;  Laterality: Right;   BVT  08/15/55   Left  Basilic Vein Transposition   CHOLECYSTECTOMY     EYE SURGERY     Catarct bil   I&[name] EXTREMITY Right 02/17/2019   Procedure: Right above the kneee debridement;  Surgeon: Marty Heck, MD;  Location: [place];  Service: Vascular;  Laterality: Right;   INSERTION OF DIALYSIS CATHETER  05/28/2012   Procedure: INSERTION OF DIALYSIS CATHETER;  Surgeon: Mal Misty, MD;  Location: [facility] OR;  Service: Vascular;  Laterality: Right;   Left arm shuntogram.     Left forearm loop graft with 6 mm Gore-Tex graft.     LOWER EXTREMITY ANGIOGRAPHY Bilateral 11/15/2018   Procedure: LOWER EXTREMITY ANGIOGRAPHY;  Surgeon: [name], MD;  Location: [place] [facility];  Service: Cardiovascular;  Laterality: Bilateral;   Pars plana vitrectomy with 25-gauge system     PERIPHERAL VASCULAR ATHERECTOMY Left 11/15/2018   Procedure: PERIPHERAL VASCULAR ATHERECTOMY;  Surgeon: [name], MD;  Location: [place] [facility];  Service: Cardiovascular;  Laterality: Left;  SFA with STENT  PERIPHERAL VASCULAR BALLOON ANGIOPLASTY Left 11/15/2018   Procedure: PERIPHERAL VASCULAR BALLOON ANGIOPLASTY;  Surgeon: [name], MD;  Location: [place] [facility];  Service: Cardiovascular;  Laterality: Left;  PT    Allergies as of 04/27/2019      Reactions   Occlusive Silicone Sheets [silicone] Other (See Comments)   Unknown reaction (listed as 'occlusive adhesive' on [facility] 02/15/2019)   Other Other (See Comments)   Unknown reaction to Occlusive adhesive   Tape  Itching   Use Cloth tape only      Medication List       Accurate as of April 27, 2019  4:10 PM. Always use your most recent med list.        acetaminophen 500 MG tablet Commonly known as:  TYLENOL Take 1,000 mg by mouth 3 (three) times daily.   ADULT NUTRITIONAL SUPPLEMENT PO Take by mouth. Magic cup with dinner meals [name]/t weight loss   atorvastatin 20 MG tablet Commonly known as:  LIPITOR Take 20 mg by mouth daily.   atorvastatin 20 MG tablet Commonly known as:  LIPITOR Take 1 tablet (20 mg total) by mouth daily at 6 PM.   calcium acetate 667 MG capsule Commonly known as:  PHOSLO Take 1,334 mg by mouth 3 (three) times daily with meals.   dicyclomine 10 MG capsule Commonly known as:  BENTYL Take 10 mg by mouth 3 (three) times daily before meals.   doxercalciferol 4 MCG/2ML injection Commonly known as:  HECTOROL Inject 1 mL (2 mcg total) into the vein every Monday, Wednesday, and Friday with hemodialysis.   feeding supplement (PRO-STAT SUGAR FREE 64) Liqd Take 30 mLs by mouth 2 (two) times daily. 9a and 5p   ferrous gluconate 240 (27 FE) MG tablet Commonly known as:  FERGON Take 1 tablet (240 mg total) by mouth 3 (three) times daily with meals.   furosemide 40 MG tablet Commonly known as:  LASIX Take 40 mg by mouth daily.   loperamide 2 MG tablet Commonly known as:  IMODIUM A-[name] Take 4 mg by mouth See admin instructions. Take 2 tablets ( 4 mg) by mouth  after 1st loose stool as needed for diarrhea   metoprolol tartrate 25 MG tablet Commonly known as:  LOPRESSOR Take 25 mg by mouth See admin instructions. Take one tablet (25 mg) by mouth twice daily on Tuesday, Thursday, Saturday and Sunday (non-dialysis days); take one tablet (25 mg) by mouth on Monday, Wednesday, Friday nights (dialysis days)   midodrine 10 MG tablet Commonly known as:  PROAMATINE Take 10 mg by mouth See admin instructions. Take one tablet (10 mg) by mouth on Monday, Wednesday, Friday 30  minutes prior to dialysis.   multivitamin with minerals Tabs tablet Take 1 tablet by mouth daily.   pantoprazole 40 MG tablet Commonly known as:  PROTONIX Take 1 tablet (40 mg total) by mouth daily.   RENAL VITAMIN PO Take 1 tablet by mouth at bedtime.   warfarin 2.5 MG tablet Commonly known as:  COUMADIN Take 2.5 mg by mouth daily.       No orders of the defined types were placed in this encounter.    There is no immunization history on file for this patient.  Social History   Tobacco Use   Smoking status: Former Smoker    Types: Cigarettes    Last attempt to quit: 06/30/2004    Years since quitting: 14.8   Smokeless tobacco: Never Used  Substance Use Topics  Alcohol use: No    Family history is   Family History  Problem Relation Age of Onset   Alzheimer's disease Mother    Diabetes Father        Amputation:  bilateral legs   Cancer Daughter        breast cancer   Diabetes Son    Heart disease Son        before age 49   Hypertension Son    Anesthesia problems Neg Hx       Review of Systems  DATA OBTAINED: from nurse GENERAL:  no fevers, fatigue, appetite changes SKIN: No itching, or rash EYES: No eye pain, redness, discharge EARS: No earache, tinnitus, change in hearing NOSE: No congestion, drainage or bleeding  MOUTH/THROAT: No mouth or tooth pain, No sore throat RESPIRATORY: No cough, wheezing, SOB CARDIAC: No chest pain, palpitations, lower extremity edema  GI: No abdominal pain, No N/V/[name] or constipation, No heartburn or reflux  GU: No dysuria, frequency or urgency, or incontinence  MUSCULOSKELETAL: No unrelieved bone/joint pain NEUROLOGIC: No headache, dizziness or focal weakness PSYCHIATRIC: No c/o anxiety or sadness   Vitals:   04/27/19 1558  BP: 98/68  Pulse: 62  Resp: 18  Temp: 97.8 F (36.6 C)  SpO2: 96%    SpO2 Readings from Last 1 Encounters:  04/27/19 96%   Body mass index is 19.3 kg/m.     Physical  Exam  GENERAL APPEARANCE: Alert, conversant,  No acute distress.  SKIN: No diaphoresis rash HEAD: Normocephalic, atraumatic  EYES: Conjunctiva/lids clear. Pupils round, reactive. EOMs intact.  EARS: External exam WNL, canals clear. Hearing grossly normal.  NOSE: No deformity or discharge.  MOUTH/THROAT: Lips w/o lesions  RESPIRATORY: Breathing is even, unlabored.   CARDIOVASCULAR:  No peripheral edema.   GASTROINTESTINAL: abd not distended w/ normal bowel sounds. GENITOURINARY: Blladder not distended  MUSCULOSKELETAL: Bilateral AKA NEUROLOGIC:  Cranial nerves 2-12 grossly intact. 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[place]) 12/30/2018   Open wound of left foot    Anticoagulation management encounter    OSA (obstructive sleep apnea)    HTN (hypertension)    ESRD on hemodialysis (HCC)    BPH (benign prostatic hyperplasia)    Hypotension 12/07/2018   GERD (gastroesophageal reflux disease) 12/07/2018   Pressure injury of skin 12/03/2018   Symptomatic anemia 12/02/2018   Acute on chronic blood loss anemia 12/02/2018   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[place]) 12/02/2018   Lower GI bleeding 12/02/2018   Heme positive stool    Acute  on chronic diastolic (congestive) heart failure ([place]) 11/19/2018   Acute CVA (cerebrovascular accident) ([place]) 11/19/2018   Hypoglycemia 11/19/2018   Peripheral vascular disease due to secondary diabetes ([place]) 11/19/2018   Dry gangrene ([place]) 11/19/2018   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  DNR (do not resuscitate) discussion    Goals of care, counseling/discussion    Palliative care by specialist    Acute hypoxemic respiratory failure ([place]) 11/06/2018   Volume overload 11/06/2018   Multifocal pneumonia 71/69/6789   Acute metabolic encephalopathy 38/09/1750   Physical deconditioning 11/06/2018   Acute respiratory failure with hypoxia and hypercapnia ([place]) 11/04/2018   CAP (community acquired pneumonia) 11/04/2018   Pleural effusion, right 11/04/2018   Increased ammonia level 11/04/2018   Abnormal CT of the abdomen 11/04/2018   Atrial fibrillation with RVR ([place]) 11/04/2018   Sepsis ([place]) 10/21/2018   Acute encephalopathy 10/21/2018   Elevated alkaline phosphatase level 10/21/2018   Chronic anemia 10/21/2018   Thrombocytopenia (HCC) 10/21/2018   Pulmonary HTN ([place]) 03/04/2017   Aortic valve stenosis 03/04/2017   Hypertension, accelerated with heart disease, without CHF 01/12/2017   Dyspnea on exertion 01/12/2017   ESRD (end stage renal disease) ([place]) 06/07/2012   Hyperlipidemia 04/24/2011   HYPERTENSION, BENIGN 10/24/2009   Type 2 diabetes mellitus with hypertension and end stage renal disease on dialysis ([place]) 10/23/2009   OBSTRUCTIVE SLEEP APNEA 10/23/2009   PAF (paroxysmal atrial fibrillation) ([place]) 10/23/2009   CHF (congestive heart failure) ([place]) 10/23/2009      Labs reviewed: Basic Metabolic Panel:    Component Value Date/Time   NA 141 02/27/2019   K 4.6 02/27/2019   CL 101 02/20/2019 1300   CO2 23 02/20/2019 1300   GLUCOSE 101 (H) 02/20/2019 1300   BUN 64 (A) 02/27/2019   CREATININE 5.4 (A) 02/27/2019    CREATININE 3.41 (H) 02/20/2019 1300   CALCIUM 7.7 (L) 02/20/2019 1300   CALCIUM 8.3 (L) 10/25/2008 1450   PROT 7.0 02/15/2019 1242   ALBUMIN 2.1 (L) 02/20/2019 1300   AST 30 02/15/2019 1242   ALT 25 02/15/2019 1242   ALKPHOS 198 (H) 02/15/2019 1242   BILITOT 0.9 02/15/2019 1242   GFRNONAA 16 (L) 02/20/2019 1300   GFRAA 19 (L) 02/20/2019 1300    Recent Labs    11/08/18 0016 11/09/18 0230  11/14/18 0241  01/02/19 1210  02/15/19 2242 02/16/19 0029 02/17/19 1456 02/20/19 1300 02/27/19  NA 138 137   < > 135   < > 137   < > 141 143 139 137 141  K 3.8 4.1   < > 5.0   < > 3.9   < > 4.8 3.8 4.0 3.0* 4.6  CL 100 100   < > 97*   < > 98   < > 102 103 101 101  --   CO2 26 27   < > 25   < > 22   < > 24 26 26 23   --   GLUCOSE 105* 94   < > 139*   < > 175*   < > 106* 104* 142* 101*  --   BUN 15 39*   < > 72*   < > 64*   < > 58* 56* 23 27* 64*  CREATININE 3.19* 5.34*   < > 7.61*   < > 8.36*   < > 6.50* 6.64* 4.46* 3.41* 5.4*  CALCIUM 8.8* 9.1   < > 9.8   < > 7.8*   < > 9.0 9.1 9.0 7.7*  --   MG 2.0 2.2  --  2.4  --   --   --   --   --   --   --   --   PHOS  --   --    < >  2.4*   < > 6.2*  --  4.0  --   --  2.4*  --    < > = values in this interval not displayed.   Liver Function Tests: Recent Labs    12/01/18 2353  12/23/18 1128  02/15/19 1242 02/15/19 2242 02/20/19 1300  AST 195*  --  26  --  30  --   --   ALT 231*  --  20  --  25  --   --   ALKPHOS 293*  --  290*  --  198*  --   --   BILITOT 1.1  --  0.9  --  0.9  --   --   PROT 6.6  --  7.0  --  7.0  --   --   ALBUMIN 2.9*   < > 2.4*   < > 2.2* 2.1* 2.1*   < > = values in this interval not displayed.   Recent Labs    10/21/18 0520 10/24/18 0614  LIPASE 35 32   Recent Labs    10/26/18 0538 11/06/18 1850 11/09/18 0230  AMMONIA 42* 40* 42*   CBC: Recent Labs    12/23/18 1128 12/24/18 0227  02/15/19 1242  02/16/19 0029 02/17/19 1456 02/20/19 1300 02/27/19  WBC 17.9* 15.6*   < > 14.7*   < > 11.7* 10.1 11.1* 10.4    NEUTROABS 15.5* 13.0*  --  11.8*  --   --   --   --   --   HGB 9.5* 10.5*   < > 10.1*   < > 10.2* 11.2* 9.4* 10.1*  HCT 31.7* 33.5*   < > 35.1*   < > 34.9* 37.9* 32.6* 32*  MCV 100.6* 99.4   < > 90.5   < > 89.7 87.5 88.1  --   PLT 223 204   < > 227   < > 215 229 207 228   < > = values in this interval not displayed.   Lipid Recent Labs    11/11/18 1517  CHOL 113  HDL 39*  LDLCALC 62  TRIG 60    Cardiac Enzymes: Recent Labs    11/06/18 1855  TROPONINI <0.03   BNP: Recent Labs    11/06/18 1905  BNP 1,611.6*   No results found for: [facility] Lab Results  Component Value Date   HGBA1C 4.9 02/16/2019   Lab Results  Component Value Date   TSH 1.956 11/09/2018   Lab Results  Component Value Date   [id number] 283 11/11/2018   Lab Results  Component Value Date   FOLATE  10/17/2008    >20.0 (NOTE)  Reference Ranges        Deficient:       0.4 - 3.3 ng/mL        Indeterminate:   3.4 - 5.4 ng/mL        Normal:              > 5.4 ng/mL   Lab Results  Component Value Date   IRON 48 06/05/2011   TIBC 194 (L) 06/05/2011   FERRITIN 872 (H) 06/05/2011    Imaging and Procedures obtained prior to SNF admission: Dg Chest 1 View  Result Date: 02/15/2019 CLINICAL DATA:  Bleeding and drainage at femur amputation site. Possible infection. EXAM: CHEST  1 VIEW COMPARISON:  12/27/2018 FINDINGS: Grossly unchanged enlarged cardiac silhouette and mediastinal contours given supine positioning. Atherosclerotic plaque when the thoracic aorta. The pulmonary vasculature appears  less distinct than present examination with cephalization of flow. There is mild elevation/eventration of right hemidiaphragm. No new discrete focal airspace opacities. No supine evidence pleural effusion or pneumothorax. No acute osseous abnormalities. IMPRESSION: Cardiomegaly with suspected mild pulmonary edema on this AP supine portable examination. Further evaluation with a PA and lateral chest radiograph may be  obtained as clinically indicated. Electronically Signed   By: Sandi Mariscal M.[name].   On: 02/15/2019 13:46   Mr Frmur Right Wo Contrast  Result Date: 02/15/2019 CLINICAL DATA:  Right AKA stump infection. Recent bilateral AKAs on 12/29/2018. EXAM: MRI OF THE RIGHT FEMUR WITHOUT CONTRAST TECHNIQUE: Multiplanar, multisequence MR imaging of the right femur was performed. No intravenous contrast was administered. COMPARISON:  Right femur x-rays from same day. FINDINGS: Limited examination due to motion artifact and SAR restrictions given history of left SFA stent. Bones/Joint/Cartilage Prior bilateral above knee amputations. No suspicious marrow signal abnormality. Unchanged 1.8 cm well-defined, T2 hyperintense lesion in the right femoral head without surrounding marrow edema. No fracture or dislocation. Normal alignment. No hip joint effusion. Muscles and Tendons Edema within the vastus lateralis and intermedius muscles. Soft tissue Ill-defined 1.7 x 4.2 x 4.2 cm complex fluid collection in the soft tissues of the right amputation stump. This appears to communicate with the skin surface. There is an additional 2.8 x 5.8 x 5.4 cm crescentic, complex fluid collection draped over the distal aspect of the residual femoral diaphysis, which may communicate with the other fluid collection. There is a small amount of soft tissue edema in the left amputation stump site without discrete fluid collection. IMPRESSION: 1. Complex fluid collections in the soft tissues of the right amputation stump and draped over the distal aspect of the residual femoral diaphysis, concerning for abscess. These may communicate. CT of the right thigh with contrast may be useful for further evaluation given limitations of this study as described above. 2. Prior bilateral above knee amputations. No evidence of osteomyelitis. 3. Nonspecific right vastus lateralis and intermedius myositis, potentially infectious in etiology. 4. Unchanged 1.8 cm  non-aggressive appearing lesion in the right femoral head. This demonstrated no uptake on recent bone scan and may represent a geode or intraosseous ganglion cyst. Electronically Signed   By: Titus Dubin M.[name].   On: 02/15/2019 17:03   Dg Femur Min 2 Views Right  Result Date: 02/15/2019 CLINICAL DATA:  Bleeding and drainage at site of amputation. Evaluate for infection. EXAM: RIGHT FEMUR 2 VIEWS COMPARISON:  None. FINDINGS: Post right above knee amputation. Scattered foci of subcutaneous emphysema about the distal aspect of the residual limb. No radiopaque foreign body. No discrete areas of osteolysis to suggest osteomyelitis. Vascular calcifications. Limited visualization of the adjacent hip and lower pelvis is normal. IMPRESSION: 1. Scattered foci of subcutaneous emphysema about the distal aspect the residual limb, nonspecific though in the absence of recent intervention, a gas producing organism could have resulted in a similar appearance. Clinical correlation is advised. Further evaluation with MRI could be performed as indicated. 2. No radiographic evidence of osteomyelitis involving the residual femur. Electronically Signed   By: Sandi Mariscal M.[name].   On: 02/15/2019 13:45     Not all labs, radiology exams or other studies done during hospitalization come through on my EPIC note; however they are reviewed by me.    Assessment and Plan  Subtherapeutic INR/paroxysmal atrial fibrillation- INR is 1.7, patient's dose is 2.5 mg daily; will give extra dose today, for total dose of 5.0 mg then returned  to 2.5 mg daily; next INR 5/7   [name].Edward Coil, MD

## 2019-04-29 ENCOUNTER — Encounter: Payer: Self-pay | Admitting: Internal Medicine

## 2019-05-04 ENCOUNTER — Non-Acute Institutional Stay (SKILLED_NURSING_FACILITY): Payer: Medicare Other | Admitting: Internal Medicine

## 2019-05-04 ENCOUNTER — Encounter: Payer: Self-pay | Admitting: Internal Medicine

## 2019-05-04 DIAGNOSIS — R791 Abnormal coagulation profile: Secondary | ICD-10-CM | POA: Diagnosis not present

## 2019-05-04 DIAGNOSIS — R195 Other fecal abnormalities: Secondary | ICD-10-CM | POA: Diagnosis not present

## 2019-05-04 NOTE — Progress Notes (Signed)
:  Location:  [facility] Room Number: [phone] Place of Service:  SNF (31)  [name]. [name], MD  Patient Care Team: Hennie Duos, MD as PCP - General (Internal Medicine) [name], MD as Consulting Physician (Nephrology) Rehab, [place] ([place], [facility]  Extended Emergency Contact Information Primary Emergency Contact: [name],[name] Address: [address], [place] [address] [name] of [place] Phone: [phone] Work Phone: [phone] Mobile Phone: [phone] Relation: Daughter Secondary Emergency Contact: [name] Mobile Phone: [phone] Relation: Other     Allergies: Occlusive silicone sheets [silicone]; Other; and Tape  Chief Complaint  Patient presents with  . Acute Visit    HPI: Patient is 78 y.o. male who nurses asked me to eval for 2 problems.  #1 patient's INR today is 1.9 and patient is on 2.5 mg daily for atrial fibrillation.  And #2 patient is still having 3 loose stools a day, which is improved from prior before patient was put on Bentyl 10 mg 3 times daily.  Patient denies any cramps or abdominal pain, blood per rectum or other alarm symptoms.  There is been no fever chills nausea vomiting.  Past Medical History:  Diagnosis Date  . A-fib ([place])   . Anemia   . Blood transfusion   . BPH (benign prostatic hyperplasia)   . CHF (congestive heart failure) ([place])   . Diarrhea   . DM (diabetes mellitus) ([place])   . ESRD on hemodialysis ([place])    Started dialysis in 2009  . History of GI bleed    secondary to coumadin  . HTN (hypertension)   . Hyperlipidemia   . OSA (obstructive sleep apnea)    uses CPAP  . Secondary hyperparathyroidism of renal origin [facility])     Past Surgical History:  Procedure Laterality Date  . ABDOMINAL AORTOGRAM N/A 11/15/2018   Procedure: ABDOMINAL AORTOGRAM;  Surgeon: [name], MD;  Location: [place]  CV LAB;  Service: Cardiovascular;  Laterality: N/A;  . AMPUTATION Left 12/06/2018   Procedure: AMPUTATION DIGIT LEFT FIFTH TOE;  Surgeon: Angelia Mould, MD;  Location: [facility];  Service: Vascular;  Laterality: Left;  . AMPUTATION Bilateral 12/29/2018   Procedure: AMPUTATION ABOVE KNEE;  Surgeon: Waynetta Sandy, MD;  Location: [facility];  Service: Vascular;  Laterality: Bilateral;  . APPLICATION OF WOUND VAC Right 02/17/2019   Procedure: Application Of Wound Vac;  Surgeon: Marty Heck, MD;  Location: [place];  Service: Vascular;  Laterality: Right;  . BVT  07/16/93   Left  Basilic Vein Transposition  . CHOLECYSTECTOMY    . EYE SURGERY     Catarct bil  . I&D EXTREMITY Right 02/17/2019   Procedure: Right above the kneee debridement;  Surgeon: Marty Heck, MD;  Location: [place];  Service: Vascular;  Laterality: Right;  . INSERTION OF DIALYSIS CATHETER  05/28/2012   Procedure: INSERTION OF DIALYSIS CATHETER;  Surgeon: Mal Misty, MD;  Location: [place];  Service: Vascular;  Laterality: Right;  . Left arm shuntogram.    . Left forearm loop graft with 6 mm Gore-Tex graft.    . LOWER EXTREMITY ANGIOGRAPHY Bilateral 11/15/2018   Procedure: LOWER EXTREMITY ANGIOGRAPHY;  Surgeon: [name], MD;  Location: [place] CV LAB;  Service: Cardiovascular;  Laterality: Bilateral;  . Pars plana vitrectomy with 25-gauge system    . PERIPHERAL VASCULAR ATHERECTOMY Left  11/15/2018   Procedure: PERIPHERAL VASCULAR ATHERECTOMY;  Surgeon: [name], MD;  Location: [place] CV LAB;  Service: Cardiovascular;  Laterality: Left;  SFA with STENT  . PERIPHERAL VASCULAR BALLOON ANGIOPLASTY Left 11/15/2018   Procedure: PERIPHERAL VASCULAR BALLOON ANGIOPLASTY;  Surgeon: [name], MD;  Location: [place] CV LAB;  Service: Cardiovascular;  Laterality: Left;  PT    Allergies as of 05/04/2019      Reactions   Occlusive Silicone Sheets [silicone] Other (See Comments)    Unknown reaction (listed as 'occlusive adhesive' on [facility] 02/15/2019)   Other Other (See Comments)   Unknown reaction to Occlusive adhesive   Tape Itching   Use Cloth tape only      Medication List       Accurate as of May 04, 2019  4:48 PM. If you have any questions, ask your nurse or doctor.        acetaminophen 500 MG tablet Commonly known as:  TYLENOL Take 1,000 mg by mouth 3 (three) times daily.   ADULT NUTRITIONAL SUPPLEMENT PO Take by mouth. Magic cup with dinner meals d/t weight loss   atorvastatin 20 MG tablet Commonly known as:  LIPITOR Take 20 mg by mouth daily.   atorvastatin 20 MG tablet Commonly known as:  LIPITOR Take 1 tablet (20 mg total) by mouth daily at 6 PM.   calcium acetate 667 MG capsule Commonly known as:  PHOSLO Take 1,334 mg by mouth 3 (three) times daily with meals.   dicyclomine 10 MG capsule Commonly known as:  BENTYL Take 10 mg by mouth 3 (three) times daily before meals.   doxercalciferol 4 MCG/2ML injection Commonly known as:  HECTOROL Inject 1 mL (2 mcg total) into the vein every Monday, Wednesday, and Friday with hemodialysis.   feeding supplement (PRO-STAT SUGAR FREE 64) Liqd Take 30 mLs by mouth 2 (two) times daily. 9a and 5p   ferrous gluconate 240 (27 FE) MG tablet Commonly known as:  FERGON Take 1 tablet (240 mg total) by mouth 3 (three) times daily with meals.   furosemide 40 MG tablet Commonly known as:  LASIX Take 40 mg by mouth daily.   loperamide 2 MG tablet Commonly known as:  IMODIUM A-D Take 4 mg by mouth See admin instructions. Take 2 tablets ( 4 mg) by mouth  after 1st loose stool as needed for diarrhea   metoprolol tartrate 25 MG tablet Commonly known as:  LOPRESSOR Take 25 mg by mouth See admin instructions. Take one tablet (25 mg) by mouth twice daily on Tuesday, Thursday, Saturday and Sunday (non-dialysis days); take one tablet (25 mg) by mouth on Monday, Wednesday, Friday nights (dialysis days)   midodrine  10 MG tablet Commonly known as:  PROAMATINE Take 10 mg by mouth See admin instructions. Take one tablet (10 mg) by mouth on Monday, Wednesday, Friday 30 minutes prior to dialysis.   multivitamin with minerals Tabs tablet Take 1 tablet by mouth daily.   pantoprazole 40 MG tablet Commonly known as:  PROTONIX Take 1 tablet (40 mg total) by mouth daily.   RENAL VITAMIN PO Take 1 tablet by mouth at bedtime.   warfarin 2.5 MG tablet Commonly known as:  COUMADIN Take 2.5 mg by mouth daily.       No orders of the defined types were placed in this encounter.    There is no immunization history on file for this patient.  Social History   Tobacco Use  . Smoking status:  Former Smoker    Types: Cigarettes    Last attempt to quit: 06/30/2004    Years since quitting: 14.8  . Smokeless tobacco: Never Used  Substance Use Topics  . Alcohol use: No    Family history is   Family History  Problem Relation Age of Onset  . Alzheimer's disease Mother   . Diabetes Father        Amputation:  bilateral legs  . Cancer Daughter        breast cancer  . Diabetes Son   . Heart disease Son        before age 65  . Hypertension Son   . Anesthesia problems Neg Hx       Review of Systems  DATA OBTAINED: from patient, nurse GENERAL:  no fevers, fatigue, appetite changes SKIN: No itching, or rash EYES: No eye pain, redness, discharge EARS: No earache, tinnitus, change in hearing NOSE: No congestion, drainage or bleeding  MOUTH/THROAT: No mouth or tooth pain, No sore throat RESPIRATORY: No cough, wheezing, SOB CARDIAC: No chest pain, palpitations, lower extremity edema  GI: No abdominal pain, No N/V/D or constipation, No heartburn or reflux; loose stool chronically GU: No dysuria, frequency or urgency, or incontinence  MUSCULOSKELETAL: No unrelieved bone/joint pain NEUROLOGIC: No headache, dizziness or focal weakness PSYCHIATRIC: No c/o anxiety or sadness   Vitals:   05/04/19 1643   BP: 106/64  Pulse: 68  Resp: 17  Temp: 98.1 F (36.7 C)    SpO2 Readings from Last 1 Encounters:  04/27/19 96%   Body mass index is 19.24 kg/m.     Physical Exam  GENERAL APPEARANCE: Alert, moderately conversant,  No acute distress.  SKIN: No diaphoresis rash HEAD: Normocephalic, atraumatic  EYES: Conjunctiva/lids clear. Pupils round, reactive. EOMs intact.  EARS: External exam WNL, canals clear. Hearing grossly normal.  NOSE: No deformity or discharge.  MOUTH/THROAT: Lips w/o lesions  RESPIRATORY: Breathing is even, unlabored. Lung sounds are clear   CARDIOVASCULAR: Heart RRR no murmurs, rubs or gallops. No peripheral edema.   GASTROINTESTINAL: Abdomen is soft, non-tender, not distended w/ normal bowel sounds. GENITOURINARY: Bladder non tender, not distended  MUSCULOSKELETAL: Bilateral AKA NEUROLOGIC:  Cranial nerves 2-12 grossly intact. Moves all extremities  PSYCHIATRIC: Mood and affect appropriate to situation, no behavioral issues  Patient Active Problem List   Diagnosis Date Noted  . 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 Anemia due to end stage renal disease ([place]) 03/24/2019  . Protein-calorie malnutrition, severe ([place]) 03/24/2019  . Hyperlipidemia associated with type 2 diabetes mellitus ([place]) 02/22/2019  . Amputation stump infection ([place]) 02/15/2019  . Conjunctivitis of left eye 01/04/2019  . 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[place]) 12/30/2018  . Open wound of left foot   . Anticoagulation management encounter   . OSA (obstructive sleep apnea)   . HTN (hypertension)   . ESRD on hemodialysis ([place])   . BPH (benign prostatic hyperplasia)   . Hypotension 12/07/2018  . GERD  (gastroesophageal reflux disease) 12/07/2018  . Pressure injury of skin 12/03/2018  . Symptomatic anemia 12/02/2018  . Acute on chronic blood loss anemia 12/02/2018  . 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[place]) 12/02/2018  . Lower GI bleeding 12/02/2018  . Heme positive  stool   . Acute on chronic diastolic (congestive) heart failure ([place]) 11/19/2018  . Acute CVA (cerebrovascular accident) ([place]) 11/19/2018  . Hypoglycemia 11/19/2018  . Peripheral vascular disease due to secondary diabetes ([place]) 11/19/2018  . Dry gangrene ([place]) 11/19/2018  . 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 DNR (do not resuscitate) discussion   . Goals of care, counseling/discussion   . Palliative care by specialist   . Acute hypoxemic respiratory failure ([place]) 11/06/2018  . Volume overload 11/06/2018  . Multifocal pneumonia 11/06/2018  . Acute metabolic encephalopathy 16/09/9603  . Physical deconditioning 11/06/2018  . Acute respiratory failure with hypoxia and hypercapnia ([place]) 11/04/2018  . CAP (community acquired pneumonia) 11/04/2018  . Pleural effusion, right 11/04/2018  . Increased ammonia level 11/04/2018  . Abnormal CT of the abdomen 11/04/2018  . Atrial fibrillation with RVR ([place]) 11/04/2018  . Sepsis ([place]) 10/21/2018  . Acute encephalopathy 10/21/2018  . Elevated alkaline phosphatase level 10/21/2018  . Chronic anemia 10/21/2018  . Thrombocytopenia ([place]) 10/21/2018  . Pulmonary HTN ([place]) 03/04/2017  . Aortic valve stenosis 03/04/2017  . Hypertension, accelerated with heart disease, without CHF 01/12/2017  . Dyspnea on exertion 01/12/2017  . ESRD (end stage renal disease) ([place]) 06/07/2012  . Hyperlipidemia 04/24/2011  . HYPERTENSION, BENIGN 10/24/2009  . Type 2 diabetes mellitus with hypertension and end stage renal disease on dialysis ([place]) 10/23/2009  . OBSTRUCTIVE SLEEP APNEA 10/23/2009  . PAF (paroxysmal atrial fibrillation) ([place]) 10/23/2009  . CHF (congestive heart  failure) ([place]) 10/23/2009      Labs reviewed: Basic Metabolic Panel:    Component Value Date/Time   NA 141 02/27/2019   K 4.6 02/27/2019   CL 101 02/20/2019 1300   CO2 23 02/20/2019 1300   GLUCOSE 101 (H) 02/20/2019 1300   BUN 64 (A) 02/27/2019   CREATININE 5.4 (A) 02/27/2019   CREATININE 3.41 (H) 02/20/2019 1300   CALCIUM 7.7 (L) 02/20/2019 1300   CALCIUM 8.3 (L) 10/25/2008 1450   PROT 7.0 02/15/2019 1242   ALBUMIN 2.1 (L) 02/20/2019 1300   AST 30 02/15/2019 1242   ALT 25 02/15/2019 1242   ALKPHOS 198 (H) 02/15/2019 1242   BILITOT 0.9 02/15/2019 1242   GFRNONAA 16 (L) 02/20/2019 1300   GFRAA 19 (L) 02/20/2019 1300    Recent Labs    11/08/18 0016 11/09/18 0230  11/14/18 0241  01/02/19 1210  02/15/19 2242 02/16/19 0029 02/17/19 1456 02/20/19 1300 02/27/19  NA 138 137   < > 135   < > 137   < > 141 143 139 137 141  K 3.8 4.1   < > 5.0   < > 3.9   < > 4.8 3.8 4.0 3.0* 4.6  CL 100 100   < > 97*   < > 98   < > 102 103 101 101  --   CO2 26 27   < > 25   < > 22   < > 24 26 26 23   --   GLUCOSE 105* 94   < > 139*   < > 175*   < > 106* 104* 142* 101*  --   BUN 15 39*   < > 72*   < > 64*   < > 58* 56* 23 27* 64*  CREATININE 3.19* 5.34*   < > 7.61*   < > 8.36*   < > 6.50* 6.64* 4.46* 3.41* 5.4*  CALCIUM 8.8* 9.1   < > 9.8   < > 7.8*   < >  9.0 9.1 9.0 7.7*  --   MG 2.0 2.2  --  2.4  --   --   --   --   --   --   --   --   PHOS  --   --    < > 2.4*   < > 6.2*  --  4.0  --   --  2.4*  --    < > = values in this interval not displayed.   Liver Function Tests: Recent Labs    12/01/18 2353  12/23/18 1128  02/15/19 1242 02/15/19 2242 02/20/19 1300  AST 195*  --  26  --  30  --   --   ALT 231*  --  20  --  25  --   --   ALKPHOS 293*  --  290*  --  198*  --   --   BILITOT 1.1  --  0.9  --  0.9  --   --   PROT 6.6  --  7.0  --  7.0  --   --   ALBUMIN 2.9*   < > 2.4*   < > 2.2* 2.1* 2.1*   < > = values in this interval not displayed.   Recent Labs    10/21/18 0520  10/24/18 0614  LIPASE 35 32   Recent Labs    10/26/18 0538 11/06/18 1850 11/09/18 0230  AMMONIA 42* 40* 42*   CBC: Recent Labs    12/23/18 1128 12/24/18 0227  02/15/19 1242  02/16/19 0029 02/17/19 1456 02/20/19 1300 02/27/19  WBC 17.9* 15.6*   < > 14.7*   < > 11.7* 10.1 11.1* 10.4  NEUTROABS 15.5* 13.0*  --  11.8*  --   --   --   --   --   HGB 9.5* 10.5*   < > 10.1*   < > 10.2* 11.2* 9.4* 10.1*  HCT 31.7* 33.5*   < > 35.1*   < > 34.9* 37.9* 32.6* 32*  MCV 100.6* 99.4   < > 90.5   < > 89.7 87.5 88.1  --   PLT 223 204   < > 227   < > 215 229 207 228   < > = values in this interval not displayed.   Lipid Recent Labs    11/11/18 1517  CHOL 113  HDL 39*  LDLCALC 62  TRIG 60    Cardiac Enzymes: Recent Labs    11/06/18 1855  TROPONINI <0.03   BNP: Recent Labs    11/06/18 1905  BNP 1,611.6*   No results found for: [facility] Lab Results  Component Value Date   HGBA1C 4.9 02/16/2019   Lab Results  Component Value Date   TSH 1.956 11/09/2018   Lab Results  Component Value Date   CVELFYBO17 510 11/11/2018   Lab Results  Component Value Date   FOLATE  10/17/2008    >20.0 (NOTE)  Reference Ranges        Deficient:       0.4 - 3.3 ng/mL        Indeterminate:   3.4 - 5.4 ng/mL        Normal:              > 5.4 ng/mL   Lab Results  Component Value Date   IRON 48 06/05/2011   TIBC 194 (L) 06/05/2011   FERRITIN 872 (H) 06/05/2011    Imaging and Procedures obtained prior to SNF admission:  Dg Chest 1 View  Result Date: 02/15/2019 CLINICAL DATA:  Bleeding and drainage at femur amputation site. Possible infection. EXAM: CHEST  1 VIEW COMPARISON:  12/27/2018 FINDINGS: Grossly unchanged enlarged cardiac silhouette and mediastinal contours given supine positioning. Atherosclerotic plaque when the thoracic aorta. The pulmonary vasculature appears less distinct than present examination with cephalization of flow. There is mild elevation/eventration of right  hemidiaphragm. No new discrete focal airspace opacities. No supine evidence pleural effusion or pneumothorax. No acute osseous abnormalities. IMPRESSION: Cardiomegaly with suspected mild pulmonary edema on this AP supine portable examination. Further evaluation with a PA and lateral chest radiograph may be obtained as clinically indicated. Electronically Signed   By: Sandi Mariscal M.D.   On: 02/15/2019 13:46   Mr Frmur Right Wo Contrast  Result Date: 02/15/2019 CLINICAL DATA:  Right AKA stump infection. Recent bilateral AKAs on 12/29/2018. EXAM: MRI OF THE RIGHT FEMUR WITHOUT CONTRAST TECHNIQUE: Multiplanar, multisequence MR imaging of the right femur was performed. No intravenous contrast was administered. COMPARISON:  Right femur x-rays from same day. FINDINGS: Limited examination due to motion artifact and SAR restrictions given history of left SFA stent. Bones/Joint/Cartilage Prior bilateral above knee amputations. No suspicious marrow signal abnormality. Unchanged 1.8 cm well-defined, T2 hyperintense lesion in the right femoral head without surrounding marrow edema. No fracture or dislocation. Normal alignment. No hip joint effusion. Muscles and Tendons Edema within the vastus lateralis and intermedius muscles. Soft tissue Ill-defined 1.7 x 4.2 x 4.2 cm complex fluid collection in the soft tissues of the right amputation stump. This appears to communicate with the skin surface. There is an additional 2.8 x 5.8 x 5.4 cm crescentic, complex fluid collection draped over the distal aspect of the residual femoral diaphysis, which may communicate with the other fluid collection. There is a small amount of soft tissue edema in the left amputation stump site without discrete fluid collection. IMPRESSION: 1. Complex fluid collections in the soft tissues of the right amputation stump and draped over the distal aspect of the residual femoral diaphysis, concerning for abscess. These may communicate. CT of the right thigh  with contrast may be useful for further evaluation given limitations of this study as described above. 2. Prior bilateral above knee amputations. No evidence of osteomyelitis. 3. Nonspecific right vastus lateralis and intermedius myositis, potentially infectious in etiology. 4. Unchanged 1.8 cm non-aggressive appearing lesion in the right femoral head. This demonstrated no uptake on recent bone scan and may represent a geode or intraosseous ganglion cyst. Electronically Signed   By: Titus Dubin M.D.   On: 02/15/2019 17:03   Dg Femur Min 2 Views Right  Result Date: 02/15/2019 CLINICAL DATA:  Bleeding and drainage at site of amputation. Evaluate for infection. EXAM: RIGHT FEMUR 2 VIEWS COMPARISON:  None. FINDINGS: Post right above knee amputation. Scattered foci of subcutaneous emphysema about the distal aspect of the residual limb. No radiopaque foreign body. No discrete areas of osteolysis to suggest osteomyelitis. Vascular calcifications. Limited visualization of the adjacent hip and lower pelvis is normal. IMPRESSION: 1. Scattered foci of subcutaneous emphysema about the distal aspect the residual limb, nonspecific though in the absence of recent intervention, a gas producing organism could have resulted in a similar appearance. Clinical correlation is advised. Further evaluation with MRI could be performed as indicated. 2. No radiographic evidence of osteomyelitis involving the residual femur. Electronically Signed   By: Sandi Mariscal M.D.   On: 02/15/2019 13:45     Not all labs, radiology exams or  other studies done during hospitalization come through on my EPIC note; however they are reviewed by me.    Assessment and Plan  Subtherapeutic INR- INR is 1.9 on 2.5 mg daily; patient has been on this dose for a long time and is normally in the therapeutic range so I am going to leave dosing at 2.5 mg daily and recheck in 1 week  Loose stools- possible IBS as patient improved with Bentyl 10 mg 3  times daily; will now increase to Bentyl 20 mg 3 times daily and monitor response      D. [name], MD

## 2019-05-08 ENCOUNTER — Encounter: Payer: Self-pay | Admitting: Internal Medicine

## 2019-05-11 ENCOUNTER — Encounter: Payer: Self-pay | Admitting: Internal Medicine

## 2019-05-11 ENCOUNTER — Non-Acute Institutional Stay (SKILLED_NURSING_FACILITY): Payer: Medicare Other | Admitting: Internal Medicine

## 2019-05-11 DIAGNOSIS — R791 Abnormal coagulation profile: Secondary | ICD-10-CM

## 2019-05-11 DIAGNOSIS — I480 Paroxysmal atrial fibrillation: Secondary | ICD-10-CM | POA: Diagnosis not present

## 2019-05-11 NOTE — Progress Notes (Signed)
Location:  [place] Room Number: 307-W Place of Service:  SNF (31)  Hennie Duos, MD  Patient Care Team: Hennie Duos, MD as PCP - General (Internal Medicine) [name], MD as Consulting Physician (Nephrology) Rehab, [facility], [facility]  Extended Emergency Contact Information Primary Emergency Contact: [name],[name] Address: [place], [place] [address] [name] of [place] Phone: [phone] Work Phone: [phone] Mobile Phone: [phone] Relation: Daughter Secondary Emergency Contact: [name] Mobile Phone: [phone] Relation: Other    Allergies: Occlusive silicone sheets [silicone]; Other; and Tape  Chief Complaint  Patient presents with  . Acute Visit    HPI: Patient is 78 y.o. male who is being seen for INR check.  Patient's INR today is 1.9.  Goal is 2-3 for atrial fibrillation.  Patient has no complaints.  Past Medical History:  Diagnosis Date  . A-fib ([place])   . Anemia   . Blood transfusion   . BPH (benign prostatic hyperplasia)   . CHF (congestive heart failure) ([place])   . Diarrhea   . DM (diabetes mellitus) ([place])   . ESRD on hemodialysis ([place])    Started dialysis in 2009  . History of GI bleed    secondary to coumadin  . HTN (hypertension)   . Hyperlipidemia   . OSA (obstructive sleep apnea)    uses CPAP  . Secondary hyperparathyroidism of renal origin [facility])     Past Surgical History:  Procedure Laterality Date  . ABDOMINAL AORTOGRAM N/A 11/15/2018   Procedure: ABDOMINAL AORTOGRAM;  Surgeon: [name], MD;  Location: [place] CV LAB;  Service: Cardiovascular;  Laterality: N/A;  . AMPUTATION Left 12/06/2018   Procedure: AMPUTATION DIGIT LEFT FIFTH TOE;  Surgeon: Angelia Mould, MD;  Location: [place];  Service: Vascular;  Laterality: Left;  . AMPUTATION Bilateral 12/29/2018   Procedure:  AMPUTATION ABOVE KNEE;  Surgeon: Waynetta Sandy, MD;  Location: [place];  Service: Vascular;  Laterality: Bilateral;  . APPLICATION OF WOUND VAC Right 02/17/2019   Procedure: Application Of Wound Vac;  Surgeon: Marty Heck, MD;  Location: [place];  Service: Vascular;  Laterality: Right;  . BVT  8/58/85   Left  Basilic Vein Transposition  . CHOLECYSTECTOMY    . EYE SURGERY     Catarct bil  . I&D EXTREMITY Right 02/17/2019   Procedure: Right above the kneee debridement;  Surgeon: Marty Heck, MD;  Location: [place];  Service: Vascular;  Laterality: Right;  . INSERTION OF DIALYSIS CATHETER  05/28/2012   Procedure: INSERTION OF DIALYSIS CATHETER;  Surgeon: Mal Misty, MD;  Location: [place];  Service: Vascular;  Laterality: Right;  . Left arm shuntogram.    . Left forearm loop graft with 6 mm Gore-Tex graft.    . LOWER EXTREMITY ANGIOGRAPHY Bilateral 11/15/2018   Procedure: LOWER EXTREMITY ANGIOGRAPHY;  Surgeon: [name], MD;  Location: [place] CV LAB;  Service: Cardiovascular;  Laterality: Bilateral;  . Pars plana vitrectomy with 25-gauge system    . PERIPHERAL VASCULAR ATHERECTOMY Left 11/15/2018   Procedure: PERIPHERAL VASCULAR ATHERECTOMY;  Surgeon: [name], MD;  Location: [place] CV LAB;  Service: Cardiovascular;  Laterality: Left;  SFA with STENT  . PERIPHERAL VASCULAR BALLOON ANGIOPLASTY Left 11/15/2018   Procedure: PERIPHERAL VASCULAR BALLOON ANGIOPLASTY;  Surgeon: [name], MD;  Location: [place] CV LAB;  Service: Cardiovascular;  Laterality: Left;  PT    Allergies as of 05/11/2019      Reactions   Occlusive Silicone Sheets [silicone] Other (See Comments)   Unknown reaction (listed as 'occlusive adhesive' on [facility] 02/15/2019)   Other Other (See Comments)   Unknown reaction to Occlusive adhesive   Tape Itching   Use Cloth tape only      Medication List       Accurate as of May 11, 2019 11:59 PM. If you have any questions,  ask your nurse or doctor.        acetaminophen 500 MG tablet Commonly known as:  TYLENOL Take 1,000 mg by mouth 3 (three) times daily.   ADULT NUTRITIONAL SUPPLEMENT PO Take by mouth. Magic cup with dinner   atorvastatin 20 MG tablet Commonly known as:  LIPITOR Take 20 mg by mouth daily. What changed:  Another medication with the same name was removed. Continue taking this medication, and follow the directions you see here. Changed by:  Inocencio Homes, MD   atorvastatin 20 MG tablet Commonly known as:  LIPITOR Take 20 mg by mouth daily. What changed:  Another medication with the same name was removed. Continue taking this medication, and follow the directions you see here. Changed by:  Inocencio Homes, MD   calcium acetate 667 MG capsule Commonly known as:  PHOSLO Take 1,334 mg by mouth 3 (three) times daily with meals.   dicyclomine 20 MG tablet Commonly known as:  BENTYL Take 20 mg by mouth 3 (three) times daily before meals. What changed:  Another medication with the same name was removed. Continue taking this medication, and follow the directions you see here. Changed by:  Inocencio Homes, MD   doxercalciferol 4 MCG/2ML injection Commonly known as:  HECTOROL Inject 1 mL (2 mcg total) into the vein every Monday, Wednesday, and Friday with hemodialysis.   feeding supplement (PRO-STAT SUGAR FREE 64) Liqd Take 30 mLs by mouth 2 (two) times daily. 9a and 5p   ferrous gluconate 240 (27 FE) MG tablet Commonly known as:  FERGON Take 1 tablet (240 mg total) by mouth 3 (three) times daily with meals.   furosemide 40 MG tablet Commonly known as:  LASIX Take 40 mg by mouth daily.   loperamide 2 MG tablet Commonly known as:  IMODIUM A-D Take 4 mg by mouth See admin instructions. Take 2 tablets ( 4 mg) by mouth  after 1st loose stool as needed for diarrhea   metoprolol tartrate 25 MG tablet Commonly known as:  LOPRESSOR Take 25 mg by mouth See admin instructions. Take one  tablet (25 mg) by mouth twice daily on Tuesday, Thursday, Saturday and Sunday (non-dialysis days); take one tablet (25 mg) by mouth on Monday, Wednesday, Friday nights (dialysis days)   midodrine 10 MG tablet Commonly known as:  PROAMATINE Take 10 mg by mouth See admin instructions. Take one tablet (10 mg) by mouth on Monday, Wednesday, Friday 30 minutes prior to dialysis.   multivitamin with minerals Tabs tablet Take 1 tablet by mouth daily.   pantoprazole 40 MG tablet Commonly known as:  PROTONIX Take 1 tablet (40 mg total) by mouth daily.   RENAL VITAMIN PO Take 1 tablet by mouth at bedtime.   warfarin 2.5 MG tablet Commonly known as:  COUMADIN Take 2.5 mg by mouth daily.       No orders of the defined types were placed in this encounter.    There is no immunization history  on file for this patient.  Social History   Tobacco Use  . Smoking status: Former Smoker    Types: Cigarettes    Last attempt to quit: 06/30/2004    Years since quitting: 14.8  . Smokeless tobacco: Never Used  Substance Use Topics  . Alcohol use: No    Review of Systems  DATA OBTAINED: from patient, nurse GENERAL:  no fevers, fatigue, appetite changes SKIN: No itching, rash HEENT: No complaint RESPIRATORY: No cough, wheezing, SOB CARDIAC: No chest pain, palpitations, lower extremity edema  GI: No abdominal pain, No N/V/D or constipation, No heartburn or reflux  GU: No dysuria, frequency or urgency, or incontinence  MUSCULOSKELETAL: No unrelieved bone/joint pain NEUROLOGIC: No headache, dizziness  PSYCHIATRIC: No overt anxiety or sadness  Vitals:   05/11/19 1610  BP: 136/67  Pulse: 80  Resp: 19  Temp: (!) 96.6 F (35.9 C)  SpO2: 94%   Body mass index is 66.94 kg/m. Physical Exam  GENERAL APPEARANCE: Alert, conversant, No acute distress  SKIN: No diaphoresis rash HEENT: Unremarkable RESPIRATORY: Breathing is even, unlabored. Lung sounds are clear   CARDIOVASCULAR: Heart RRR no  murmurs, rubs or gallops. No peripheral edema  GASTROINTESTINAL: Abdomen is soft, non-tender, not distended w/ normal bowel sounds.  GENITOURINARY: Bladder non tender, not distended  MUSCULOSKELETAL: Bilateral AKA NEUROLOGIC: Cranial nerves 2-12 grossly intact. Moves all extremities PSYCHIATRIC: Mood and affect appropriate to situation, no behavioral issues  Patient Active Problem List   Diagnosis Date Noted  . 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 Anemia due to end stage renal disease ([place]) 03/24/2019  . Protein-calorie malnutrition, severe ([place]) 03/24/2019  . Hyperlipidemia associated with type 2 diabetes mellitus ([place]) 02/22/2019  . Amputation stump infection ([place]) 02/15/2019  . Conjunctivitis of left eye 01/04/2019  . 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[place]) 12/30/2018  . Open wound of left foot   . Anticoagulation management encounter   . OSA (obstructive sleep apnea)   . HTN (hypertension)   . ESRD on hemodialysis ([place])   . BPH (benign prostatic hyperplasia)   . Hypotension 12/07/2018  . GERD (gastroesophageal reflux disease) 12/07/2018  . Pressure injury of skin 12/03/2018  . Symptomatic anemia 12/02/2018  . Acute on chronic blood loss anemia 12/02/2018  . 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[place]) 12/02/2018  . Lower GI bleeding 12/02/2018  . Heme positive stool   . Acute on chronic diastolic (congestive) heart failure ([place]) 11/19/2018  . Acute CVA (cerebrovascular accident) ([place]) 11/19/2018  . Hypoglycemia 11/19/2018  . Peripheral vascular disease due to secondary diabetes ([place]) 11/19/2018  . Dry gangrene ([place]) 11/19/2018  . 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 DNR (do not  resuscitate) discussion   . Goals of care, counseling/discussion   . Palliative care by specialist   . Acute hypoxemic respiratory failure ([place]) 11/06/2018  . Volume overload 11/06/2018  . Multifocal pneumonia 11/06/2018  . Acute metabolic encephalopathy 73/71/0626  . Physical deconditioning 11/06/2018  . Acute respiratory failure with hypoxia and hypercapnia ([place]) 11/04/2018  . CAP (community acquired pneumonia) 11/04/2018  . Pleural effusion, right 11/04/2018  . Increased ammonia level 11/04/2018  . Abnormal CT of the abdomen 11/04/2018  . Atrial fibrillation with RVR ([place]) 11/04/2018  .  Sepsis ([place]) 10/21/2018  . Acute encephalopathy 10/21/2018  . Elevated alkaline phosphatase level 10/21/2018  . Chronic anemia 10/21/2018  . Thrombocytopenia ([place]) 10/21/2018  . Pulmonary HTN ([place]) 03/04/2017  . Aortic valve stenosis 03/04/2017  . Hypertension, accelerated with heart disease, without CHF 01/12/2017  . Dyspnea on exertion 01/12/2017  . ESRD (end stage renal disease) ([place]) 06/07/2012  . Hyperlipidemia 04/24/2011  . HYPERTENSION, BENIGN 10/24/2009  . Type 2 diabetes mellitus with hypertension and end stage renal disease on dialysis ([place]) 10/23/2009  . OBSTRUCTIVE SLEEP APNEA 10/23/2009  . PAF (paroxysmal atrial fibrillation) ([place]) 10/23/2009  . CHF (congestive heart failure) ([place]) 10/23/2009    CMP     Component Value Date/Time   NA 141 02/27/2019   K 4.6 02/27/2019   CL 101 02/20/2019 1300   CO2 23 02/20/2019 1300   GLUCOSE 101 (H) 02/20/2019 1300   BUN 64 (A) 02/27/2019   CREATININE 5.4 (A) 02/27/2019   CREATININE 3.41 (H) 02/20/2019 1300   CALCIUM 7.7 (L) 02/20/2019 1300   CALCIUM 8.3 (L) 10/25/2008 1450   PROT 7.0 02/15/2019 1242   ALBUMIN 2.1 (L) 02/20/2019 1300   AST 30 02/15/2019 1242   ALT 25 02/15/2019 1242   ALKPHOS 198 (H) 02/15/2019 1242   BILITOT 0.9 02/15/2019 1242   GFRNONAA 16 (L) 02/20/2019 1300   GFRAA 19 (L) 02/20/2019 1300   Recent Labs     11/08/18 0016 11/09/18 0230  11/14/18 0241  01/02/19 1210  02/15/19 2242 02/16/19 0029 02/17/19 1456 02/20/19 1300 02/27/19  NA 138 137   < > 135   < > 137   < > 141 143 139 137 141  K 3.8 4.1   < > 5.0   < > 3.9   < > 4.8 3.8 4.0 3.0* 4.6  CL 100 100   < > 97*   < > 98   < > 102 103 101 101  --   CO2 26 27   < > 25   < > 22   < > 24 26 26 23   --   GLUCOSE 105* 94   < > 139*   < > 175*   < > 106* 104* 142* 101*  --   BUN 15 39*   < > 72*   < > 64*   < > 58* 56* 23 27* 64*  CREATININE 3.19* 5.34*   < > 7.61*   < > 8.36*   < > 6.50* 6.64* 4.46* 3.41* 5.4*  CALCIUM 8.8* 9.1   < > 9.8   < > 7.8*   < > 9.0 9.1 9.0 7.7*  --   MG 2.0 2.2  --  2.4  --   --   --   --   --   --   --   --   PHOS  --   --    < > 2.4*   < > 6.2*  --  4.0  --   --  2.4*  --    < > = values in this interval not displayed.   Recent Labs    12/01/18 2353  12/23/18 1128  02/15/19 1242 02/15/19 2242 02/20/19 1300  AST 195*  --  26  --  30  --   --   ALT 231*  --  20  --  25  --   --   ALKPHOS 293*  --  290*  --  198*  --   --  BILITOT 1.1  --  0.9  --  0.9  --   --   PROT 6.6  --  7.0  --  7.0  --   --   ALBUMIN 2.9*   < > 2.4*   < > 2.2* 2.1* 2.1*   < > = values in this interval not displayed.   Recent Labs    12/23/18 1128 12/24/18 0227  02/15/19 1242  02/16/19 0029 02/17/19 1456 02/20/19 1300 02/27/19  WBC 17.9* 15.6*   < > 14.7*   < > 11.7* 10.1 11.1* 10.4  NEUTROABS 15.5* 13.0*  --  11.8*  --   --   --   --   --   HGB 9.5* 10.5*   < > 10.1*   < > 10.2* 11.2* 9.4* 10.1*  HCT 31.7* 33.5*   < > 35.1*   < > 34.9* 37.9* 32.6* 32*  MCV 100.6* 99.4   < > 90.5   < > 89.7 87.5 88.1  --   PLT 223 204   < > 227   < > 215 229 207 228   < > = values in this interval not displayed.   Recent Labs    11/11/18 1517  CHOL 113  LDLCALC 62  TRIG 60   No results found for: [facility] Lab Results  Component Value Date   TSH 1.956 11/09/2018   Lab Results  Component Value Date   HGBA1C 4.9 02/16/2019    Lab Results  Component Value Date   CHOL 113 11/11/2018   HDL 39 (L) 11/11/2018   LDLCALC 62 11/11/2018   TRIG 60 11/11/2018   CHOLHDL 2.9 11/11/2018    Significant Diagnostic Results in last 30 days:  No results found.  Assessment and Plan  Subtherapeutic INR/atrial fibrillation- last weeks patient's INR was 1.7 and he received an extra Coumadin 2.5 mg; this week patient's 1.9.  Will increase patient's dose to an extra 2.5 today and repeat in 1 week    Noah Delaine. [name], MD

## 2019-05-14 ENCOUNTER — Encounter: Payer: Self-pay | Admitting: Internal Medicine

## 2019-05-17 ENCOUNTER — Encounter: Payer: Self-pay | Admitting: Internal Medicine

## 2019-05-17 ENCOUNTER — Non-Acute Institutional Stay (SKILLED_NURSING_FACILITY): Payer: Medicare Other | Admitting: Internal Medicine

## 2019-05-17 DIAGNOSIS — I119 Hypertensive heart disease without heart failure: Secondary | ICD-10-CM | POA: Diagnosis not present

## 2019-05-17 DIAGNOSIS — I480 Paroxysmal atrial fibrillation: Secondary | ICD-10-CM | POA: Diagnosis not present

## 2019-05-17 DIAGNOSIS — I5032 Chronic diastolic (congestive) heart failure: Secondary | ICD-10-CM

## 2019-05-17 NOTE — Progress Notes (Signed)
Location:  [place] Room Number: 307-W Place of Service:  SNF (31)  Hennie Duos, MD  Patient Care Team: Hennie Duos, MD as PCP - General (Internal Medicine) [name], MD as Consulting Physician (Nephrology) Rehab, [facility], [facility]  Extended Emergency Contact Information Primary Emergency Contact: [name],[name] Address: [place], [place] [address] [name] of [place] Phone: [phone] Work Phone: [phone] Mobile Phone: [phone] Relation: Daughter Secondary Emergency Contact: [name] Mobile Phone: [phone] Relation: Other    Allergies: Occlusive silicone sheets [silicone]; Other; and Tape  Chief Complaint  Patient presents with  . Medical Management of Chronic Issues    Routine [facility] visit    HPI: Patient is a 78 y.o. male who is being seen for routine issues of A. fib, chronic congestive heart failure, and hypertension.  Past Medical History:  Diagnosis Date  . A-fib ([place])   . Anemia   . Blood transfusion   . BPH (benign prostatic hyperplasia)   . CHF (congestive heart failure) ([place])   . Diarrhea   . DM (diabetes mellitus) ([place])   . ESRD on hemodialysis ([place])    Started dialysis in 2009  . History of GI bleed    secondary to coumadin  . HTN (hypertension)   . Hyperlipidemia   . OSA (obstructive sleep apnea)    uses CPAP  . Secondary hyperparathyroidism of renal origin [facility])     Past Surgical History:  Procedure Laterality Date  . ABDOMINAL AORTOGRAM N/A 11/15/2018   Procedure: ABDOMINAL AORTOGRAM;  Surgeon: [name], MD;  Location: [place] CV LAB;  Service: Cardiovascular;  Laterality: N/A;  . AMPUTATION Left 12/06/2018   Procedure: AMPUTATION DIGIT LEFT FIFTH TOE;  Surgeon: Angelia Mould, MD;  Location: [place];  Service: Vascular;  Laterality: Left;  . AMPUTATION  Bilateral 12/29/2018   Procedure: AMPUTATION ABOVE KNEE;  Surgeon: Waynetta Sandy, MD;  Location: [place];  Service: Vascular;  Laterality: Bilateral;  . APPLICATION OF WOUND VAC Right 02/17/2019   Procedure: Application Of Wound Vac;  Surgeon: Marty Heck, MD;  Location: [place];  Service: Vascular;  Laterality: Right;  . BVT  5/40/08   Left  Basilic Vein Transposition  . CHOLECYSTECTOMY    . EYE SURGERY     Catarct bil  . I&D EXTREMITY Right 02/17/2019   Procedure: Right above the kneee debridement;  Surgeon: Marty Heck, MD;  Location: [place];  Service: Vascular;  Laterality: Right;  . INSERTION OF DIALYSIS CATHETER  05/28/2012   Procedure: INSERTION OF DIALYSIS CATHETER;  Surgeon: Mal Misty, MD;  Location: [place];  Service: Vascular;  Laterality: Right;  . Left arm shuntogram.    . Left forearm loop graft with 6 mm Gore-Tex graft.    . LOWER EXTREMITY ANGIOGRAPHY Bilateral 11/15/2018   Procedure: LOWER EXTREMITY ANGIOGRAPHY;  Surgeon: [name], MD;  Location: [place] CV LAB;  Service: Cardiovascular;  Laterality: Bilateral;  . Pars plana vitrectomy with 25-gauge system    . PERIPHERAL VASCULAR ATHERECTOMY Left 11/15/2018   Procedure: PERIPHERAL VASCULAR ATHERECTOMY;  Surgeon: [name], MD;  Location: [place] CV LAB;  Service: Cardiovascular;  Laterality: Left;  SFA with STENT  . PERIPHERAL VASCULAR BALLOON ANGIOPLASTY Left 11/15/2018   Procedure: PERIPHERAL VASCULAR BALLOON ANGIOPLASTY;  Surgeon: [name], MD;  Location: [facility]  INVASIVE CV LAB;  Service: Cardiovascular;  Laterality: Left;  PT    Allergies as of 05/17/2019      Reactions   Occlusive Silicone Sheets [silicone] Other (See Comments)   Unknown reaction (listed as 'occlusive adhesive' on [facility] 02/15/2019)   Other Other (See Comments)   Unknown reaction to Occlusive adhesive   Tape Itching   Use Cloth tape only      Medication List       Accurate as of May 17, 2019 11:59  PM. If you have any questions, ask your nurse or doctor.        acetaminophen 500 MG tablet Commonly known as:  TYLENOL Take 1,000 mg by mouth 3 (three) times daily.   ADULT NUTRITIONAL SUPPLEMENT PO Take by mouth. Magic cup with dinner   atorvastatin 20 MG tablet Commonly known as:  LIPITOR Take 20 mg by mouth daily.   atorvastatin 20 MG tablet Commonly known as:  LIPITOR Take 20 mg by mouth daily.   calcium acetate 667 MG capsule Commonly known as:  PHOSLO Take 1,334 mg by mouth 3 (three) times daily with meals.   dicyclomine 20 MG tablet Commonly known as:  BENTYL Take 20 mg by mouth 3 (three) times daily before meals.   doxercalciferol 4 MCG/2ML injection Commonly known as:  HECTOROL Inject 1 mL (2 mcg total) into the vein every Monday, Wednesday, and Friday with hemodialysis.   feeding supplement (PRO-STAT SUGAR FREE 64) Liqd Take 30 mLs by mouth 2 (two) times daily. 9a and 5p   ferrous gluconate 240 (27 FE) MG tablet Commonly known as:  FERGON Take 1 tablet (240 mg total) by mouth 3 (three) times daily with meals.   furosemide 40 MG tablet Commonly known as:  LASIX Take 40 mg by mouth daily.   loperamide 2 MG tablet Commonly known as:  IMODIUM A-D Take 4 mg by mouth See admin instructions. Take 2 tablets ( 4 mg) by mouth  after 1st loose stool as needed for diarrhea   metoprolol tartrate 25 MG tablet Commonly known as:  LOPRESSOR Take 25 mg by mouth See admin instructions. Take one tablet (25 mg) by mouth twice daily on Tuesday, Thursday, Saturday and Sunday (non-dialysis days); take one tablet (25 mg) by mouth on Monday, Wednesday, Friday nights (dialysis days)   midodrine 10 MG tablet Commonly known as:  PROAMATINE Take 10 mg by mouth See admin instructions. Take one tablet (10 mg) by mouth on Monday, Wednesday, Friday 30 minutes prior to dialysis.   multivitamin with minerals Tabs tablet Take 1 tablet by mouth daily.   pantoprazole 40 MG tablet  Commonly known as:  PROTONIX Take 1 tablet (40 mg total) by mouth daily.   RENAL VITAMIN PO Take 1 tablet by mouth at bedtime.   warfarin 2.5 MG tablet Commonly known as:  COUMADIN Take 2.5 mg by mouth daily.       No orders of the defined types were placed in this encounter.    There is no immunization history on file for this patient.  Social History   Tobacco Use  . Smoking status: Former Smoker    Types: Cigarettes    Last attempt to quit: 06/30/2004    Years since quitting: 14.8  . Smokeless tobacco: Never Used  Substance Use Topics  . Alcohol use: No    Review of Systems  DATA OBTAINED: from nurse GENERAL:  no fevers, fatigue, appetite changes SKIN: No itching, rash HEENT: No complaint RESPIRATORY: No  cough, wheezing, SOB CARDIAC: No chest pain, palpitations, lower extremity edema  GI: No abdominal pain, No N/V/D or constipation, No heartburn or reflux  GU: No dysuria, frequency or urgency, or incontinence  MUSCULOSKELETAL: No unrelieved bone/joint pain NEUROLOGIC: No headache, dizziness  PSYCHIATRIC: No overt anxiety or sadness  Vitals:   05/17/19 0913  BP: (!) 108/52  Pulse: 86  Resp: 20  Temp: (!) 97 F (36.1 C)  SpO2: 94%   Body mass index is 66.18 kg/m. Physical Exam  GENERAL APPEARANCE: Alert, moderately conversant, No acute distress  SKIN: No diaphoresis rash HEENT: Unremarkable RESPIRATORY: Breathing is even, unlabored. Lung sounds are clear   CARDIOVASCULAR: Heart RRR no murmurs, rubs or gallops. No peripheral edema  GASTROINTESTINAL: Abdomen is soft, non-tender, not distended w/ normal bowel sounds.  GENITOURINARY: Bladder non tender, not distended  MUSCULOSKELETAL:bi Lateral AKA NEUROLOGIC: Cranial nerves 2-12 grossly intact. 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 End stage renal disease on dialysis due to type 2 diabetes mellitus ([place]) 03/24/2019  . 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 Anemia due to end stage renal disease ([place]) 03/24/2019  . Protein-calorie malnutrition, severe ([place]) 03/24/2019  . Hyperlipidemia associated with type 2 diabetes mellitus ([place]) 02/22/2019  . Amputation stump infection ([place]) 02/15/2019  . Conjunctivitis of left eye 01/04/2019  . 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[place]) 12/30/2018  . Open wound of left foot   . Anticoagulation management encounter   . OSA (obstructive sleep apnea)   . HTN (hypertension)   . ESRD on hemodialysis ([place])   . BPH (benign prostatic hyperplasia)   . Hypotension 12/07/2018  . GERD (gastroesophageal reflux disease) 12/07/2018  . Pressure injury of skin 12/03/2018  . Symptomatic anemia 12/02/2018  . Acute on chronic blood loss anemia 12/02/2018  . 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[place]) 12/02/2018  . Lower GI bleeding 12/02/2018  . Heme positive stool   . Acute on chronic diastolic (congestive) heart failure ([place]) 11/19/2018  . Acute CVA (cerebrovascular accident) ([place]) 11/19/2018  . Hypoglycemia 11/19/2018  . Peripheral vascular disease due to secondary diabetes ([place]) 11/19/2018  . Dry gangrene ([place]) 11/19/2018  . 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 DNR (do not resuscitate) discussion   . Goals of care, counseling/discussion   . Palliative care by specialist   . Acute hypoxemic respiratory failure ([place]) 11/06/2018  . Volume overload 11/06/2018  . Multifocal pneumonia 11/06/2018  . Acute metabolic encephalopathy 63/78/5885  . Physical deconditioning 11/06/2018  . Acute respiratory failure with hypoxia and hypercapnia ([place]) 11/04/2018  . CAP (community acquired pneumonia) 11/04/2018  . Pleural effusion, right 11/04/2018  . Increased ammonia  level 11/04/2018  . Abnormal CT of the abdomen 11/04/2018  . Atrial fibrillation with RVR ([place]) 11/04/2018  . Sepsis ([place]) 10/21/2018  . Acute encephalopathy 10/21/2018  . Elevated alkaline phosphatase level 10/21/2018  . Chronic anemia 10/21/2018  . Thrombocytopenia ([place]) 10/21/2018  . Pulmonary HTN ([place]) 03/04/2017  . Aortic valve stenosis 03/04/2017  . Hypertension, accelerated with heart disease, without CHF 01/12/2017  . Dyspnea on exertion 01/12/2017  . ESRD (end stage renal disease) ([place]) 06/07/2012  . Hyperlipidemia 04/24/2011  . HYPERTENSION, BENIGN 10/24/2009  . Type 2 diabetes mellitus with hypertension and  end stage renal disease on dialysis ([place]) 10/23/2009  . OBSTRUCTIVE SLEEP APNEA 10/23/2009  . PAF (paroxysmal atrial fibrillation) ([place]) 10/23/2009  . CHF (congestive heart failure) ([place]) 10/23/2009    CMP     Component Value Date/Time   NA 141 02/27/2019   K 4.6 02/27/2019   CL 101 02/20/2019 1300   CO2 23 02/20/2019 1300   GLUCOSE 101 (H) 02/20/2019 1300   BUN 64 (A) 02/27/2019   CREATININE 5.4 (A) 02/27/2019   CREATININE 3.41 (H) 02/20/2019 1300   CALCIUM 7.7 (L) 02/20/2019 1300   CALCIUM 8.3 (L) 10/25/2008 1450   PROT 7.0 02/15/2019 1242   ALBUMIN 2.1 (L) 02/20/2019 1300   AST 30 02/15/2019 1242   ALT 25 02/15/2019 1242   ALKPHOS 198 (H) 02/15/2019 1242   BILITOT 0.9 02/15/2019 1242   GFRNONAA 16 (L) 02/20/2019 1300   GFRAA 19 (L) 02/20/2019 1300   Recent Labs    11/08/18 0016 11/09/18 0230  11/14/18 0241  01/02/19 1210  02/15/19 2242 02/16/19 0029 02/17/19 1456 02/20/19 1300 02/27/19  NA 138 137   < > 135   < > 137   < > 141 143 139 137 141  K 3.8 4.1   < > 5.0   < > 3.9   < > 4.8 3.8 4.0 3.0* 4.6  CL 100 100   < > 97*   < > 98   < > 102 103 101 101  --   CO2 26 27   < > 25   < > 22   < > 24 26 26 23   --   GLUCOSE 105* 94   < > 139*   < > 175*   < > 106* 104* 142* 101*  --   BUN 15 39*   < > 72*   < > 64*   < > 58* 56* 23 27* 64*   CREATININE 3.19* 5.34*   < > 7.61*   < > 8.36*   < > 6.50* 6.64* 4.46* 3.41* 5.4*  CALCIUM 8.8* 9.1   < > 9.8   < > 7.8*   < > 9.0 9.1 9.0 7.7*  --   MG 2.0 2.2  --  2.4  --   --   --   --   --   --   --   --   PHOS  --   --    < > 2.4*   < > 6.2*  --  4.0  --   --  2.4*  --    < > = values in this interval not displayed.   Recent Labs    12/01/18 2353  12/23/18 1128  02/15/19 1242 02/15/19 2242 02/20/19 1300  AST 195*  --  26  --  30  --   --   ALT 231*  --  20  --  25  --   --   ALKPHOS 293*  --  290*  --  198*  --   --   BILITOT 1.1  --  0.9  --  0.9  --   --   PROT 6.6  --  7.0  --  7.0  --   --   ALBUMIN 2.9*   < > 2.4*   < > 2.2* 2.1* 2.1*   < > = values in this interval not displayed.   Recent Labs    12/23/18 1128 12/24/18 0227  02/15/19 1242  02/16/19 0029 02/17/19 1456 02/20/19  1300 02/27/19  WBC 17.9* 15.6*   < > 14.7*   < > 11.7* 10.1 11.1* 10.4  NEUTROABS 15.5* 13.0*  --  11.8*  --   --   --   --   --   HGB 9.5* 10.5*   < > 10.1*   < > 10.2* 11.2* 9.4* 10.1*  HCT 31.7* 33.5*   < > 35.1*   < > 34.9* 37.9* 32.6* 32*  MCV 100.6* 99.4   < > 90.5   < > 89.7 87.5 88.1  --   PLT 223 204   < > 227   < > 215 229 207 228   < > = values in this interval not displayed.   Recent Labs    11/11/18 1517  CHOL 113  LDLCALC 62  TRIG 60   No results found for: [facility] Lab Results  Component Value Date   TSH 1.956 11/09/2018   Lab Results  Component Value Date   HGBA1C 4.9 02/16/2019   Lab Results  Component Value Date   CHOL 113 11/11/2018   HDL 39 (L) 11/11/2018   LDLCALC 62 11/11/2018   TRIG 60 11/11/2018   CHOLHDL 2.9 11/11/2018  Significant Diagnostic Results in last 30 days:  No results found.  Assessment and Plan  PAF (paroxysmal atrial fibrillation) (HCC) Chronic and stable; continue metoprolol 25 mg twice daily-no a.m. dose on Monday Wednesday Friday, dialysis days- and Coumadin as prophylaxis  CHF (congestive heart failure) ([place]) No problems  reported; continue Lasix 40 mg daily and metoprolol 25 mg twice daily  Hypertension, accelerated with heart disease, without CHF No reported problems; continue metoprolol 25 mg twice daily except for dialysis days.  On dialysis days on Monday Wednesday Friday patient gets midodrine 10 mg before he goes to dialysis; continue current regimen     Webb Silversmith D. [name], MD

## 2019-05-18 ENCOUNTER — Non-Acute Institutional Stay (SKILLED_NURSING_FACILITY): Payer: Medicare Other | Admitting: Internal Medicine

## 2019-05-18 DIAGNOSIS — I739 Peripheral vascular disease, unspecified: Secondary | ICD-10-CM | POA: Diagnosis not present

## 2019-05-18 DIAGNOSIS — Z7901 Long term (current) use of anticoagulants: Secondary | ICD-10-CM

## 2019-05-18 DIAGNOSIS — I480 Paroxysmal atrial fibrillation: Secondary | ICD-10-CM | POA: Diagnosis not present

## 2019-05-19 ENCOUNTER — Encounter: Payer: Self-pay | Admitting: Internal Medicine

## 2019-05-19 NOTE — Assessment & Plan Note (Signed)
No reported problems; continue metoprolol 25 mg twice daily except for dialysis days.  On dialysis days on Monday Wednesday Friday patient gets midodrine 10 mg before he goes to dialysis; continue current regimen

## 2019-05-19 NOTE — Assessment & Plan Note (Signed)
No problems reported; continue Lasix 40 mg daily and metoprolol 25 mg twice daily

## 2019-05-19 NOTE — Assessment & Plan Note (Signed)
Chronic and stable; continue metoprolol 25 mg twice daily-no a.m. dose on Monday Wednesday Friday, dialysis days- and Coumadin as prophylaxis

## 2019-05-20 ENCOUNTER — Encounter: Payer: Self-pay | Admitting: Internal Medicine

## 2019-05-20 NOTE — Progress Notes (Signed)
Location:   [facility] Service:   SNF  [name], Noah Delaine, MD  Patient Care Team: Hennie Duos, MD as PCP - General (Internal Medicine) [name], MD as Consulting Physician (Nephrology) Rehab, [facility], [facility]  Extended Emergency Contact Information Primary Emergency Contact: [name],[name] Address: [place], [place] [address] [name] of [place] Phone: [phone] Work Phone: [phone] Mobile Phone: [phone] Relation: Daughter Secondary Emergency Contact: [name] Mobile Phone: [phone] Relation: Other    Allergies: Occlusive silicone sheets [silicone]; Other; and Tape  Chief Complaint  Patient presents with  . Acute Visit    HPI: Patient is 78 y.o. male with history of atrial fibrillation is being seen today for a subtherapeutic INR.  Patient has no complaints.  Past Medical History:  Diagnosis Date  . A-fib ([place])   . Anemia   . Blood transfusion   . BPH (benign prostatic hyperplasia)   . CHF (congestive heart failure) ([place])   . Diarrhea   . DM (diabetes mellitus) ([place])   . ESRD on hemodialysis ([place])    Started dialysis in 2009  . History of GI bleed    secondary to coumadin  . HTN (hypertension)   . Hyperlipidemia   . OSA (obstructive sleep apnea)    uses CPAP  . Secondary hyperparathyroidism of renal origin [facility])     Past Surgical History:  Procedure Laterality Date  . ABDOMINAL AORTOGRAM N/A 11/15/2018   Procedure: ABDOMINAL AORTOGRAM;  Surgeon: [name], MD;  Location: [place] [facility] LAB;  Service: Cardiovascular;  Laterality: N/A;  . AMPUTATION Left 12/06/2018   Procedure: AMPUTATION DIGIT LEFT FIFTH TOE;  Surgeon: Angelia Mould, MD;  Location: [place];  Service: Vascular;  Laterality: Left;  . AMPUTATION Bilateral 12/29/2018   Procedure: AMPUTATION ABOVE KNEE;  Surgeon: Waynetta Sandy, MD;   Location: [place];  Service: Vascular;  Laterality: Bilateral;  . APPLICATION OF WOUND VAC Right 02/17/2019   Procedure: Application Of Wound Vac;  Surgeon: Marty Heck, MD;  Location: [place];  Service: Vascular;  Laterality: Right;  . BVT  6/72/09   Left  Basilic Vein Transposition  . CHOLECYSTECTOMY    . EYE SURGERY     Catarct bil  . I&D EXTREMITY Right 02/17/2019   Procedure: Right above the kneee debridement;  Surgeon: Marty Heck, MD;  Location: [place];  Service: Vascular;  Laterality: Right;  . INSERTION OF DIALYSIS CATHETER  05/28/2012   Procedure: INSERTION OF DIALYSIS CATHETER;  Surgeon: Mal Misty, MD;  Location: [place];  Service: Vascular;  Laterality: Right;  . Left arm shuntogram.    . Left forearm loop graft with 6 mm Gore-Tex graft.    . LOWER EXTREMITY ANGIOGRAPHY Bilateral 11/15/2018   Procedure: LOWER EXTREMITY ANGIOGRAPHY;  Surgeon: [name], MD;  Location: [facility];  Service: Cardiovascular;  Laterality: Bilateral;  . Pars plana vitrectomy with 25-gauge system    . PERIPHERAL VASCULAR ATHERECTOMY Left 11/15/2018   Procedure: PERIPHERAL VASCULAR ATHERECTOMY;  Surgeon: [name], MD;  Location: [place] [facility];  Service: Cardiovascular;  Laterality: Left;  SFA with STENT  . PERIPHERAL VASCULAR BALLOON ANGIOPLASTY Left 11/15/2018   Procedure: PERIPHERAL VASCULAR BALLOON ANGIOPLASTY;  Surgeon: [name], MD;  Location: [place] [facility];  Service: Cardiovascular;  Laterality: Left;  PT    Allergies  as of 05/18/2019      Reactions   Occlusive Silicone Sheets [silicone] Other (See Comments)   Unknown reaction (listed as 'occlusive adhesive' on [facility] 02/15/2019)   Other Other (See Comments)   Unknown reaction to Occlusive adhesive   Tape Itching   Use Cloth tape only      Medication List       Accurate as of May 18, 2019 11:59 PM. If you have any questions, ask your nurse or doctor.        acetaminophen 500 MG tablet  Commonly known as:  TYLENOL Take 1,000 mg by mouth 3 (three) times daily.   ADULT NUTRITIONAL SUPPLEMENT PO Take by mouth. Magic cup with dinner   atorvastatin 20 MG tablet Commonly known as:  LIPITOR Take 20 mg by mouth daily.   atorvastatin 20 MG tablet Commonly known as:  LIPITOR Take 20 mg by mouth daily.   calcium acetate 667 MG capsule Commonly known as:  PHOSLO Take 1,334 mg by mouth 3 (three) times daily with meals.   dicyclomine 20 MG tablet Commonly known as:  BENTYL Take 20 mg by mouth 3 (three) times daily before meals.   doxercalciferol 4 MCG/2ML injection Commonly known as:  HECTOROL Inject 1 mL (2 mcg total) into the vein every Monday, Wednesday, and Friday with hemodialysis.   feeding supplement (PRO-STAT SUGAR FREE 64) Liqd Take 30 mLs by mouth 2 (two) times daily. 9a and 5p   ferrous gluconate 240 (27 FE) MG tablet Commonly known as:  FERGON Take 1 tablet (240 mg total) by mouth 3 (three) times daily with meals.   furosemide 40 MG tablet Commonly known as:  LASIX Take 40 mg by mouth daily.   loperamide 2 MG tablet Commonly known as:  IMODIUM A-D Take 4 mg by mouth See admin instructions. Take 2 tablets ( 4 mg) by mouth  after 1st loose stool as needed for diarrhea   metoprolol tartrate 25 MG tablet Commonly known as:  LOPRESSOR Take 25 mg by mouth See admin instructions. Take one tablet (25 mg) by mouth twice daily on Tuesday, Thursday, Saturday and Sunday (non-dialysis days); take one tablet (25 mg) by mouth on Monday, Wednesday, Friday nights (dialysis days)   midodrine 10 MG tablet Commonly known as:  PROAMATINE Take 10 mg by mouth See admin instructions. Take one tablet (10 mg) by mouth on Monday, Wednesday, Friday 30 minutes prior to dialysis.   multivitamin with minerals Tabs tablet Take 1 tablet by mouth daily.   pantoprazole 40 MG tablet Commonly known as:  PROTONIX Take 1 tablet (40 mg total) by mouth daily.   RENAL VITAMIN PO Take  1 tablet by mouth at bedtime.   warfarin 2.5 MG tablet Commonly known as:  COUMADIN Take 2.5 mg by mouth daily.       No orders of the defined types were placed in this encounter.    There is no immunization history on file for this patient.  Social History   Tobacco Use  . Smoking status: Former Smoker    Types: Cigarettes    Last attempt to quit: 06/30/2004    Years since quitting: 14.8  . Smokeless tobacco: Never Used  Substance Use Topics  . Alcohol use: No    Review of Systems  DATA OBTAINED: from patient, nurse GENERAL:  no fevers, fatigue, appetite changes SKIN: No itching, rash HEENT: No complaint RESPIRATORY: No cough, wheezing, SOB CARDIAC: No chest pain, palpitations, lower extremity edema  GI: No  abdominal pain, No N/V/D or constipation, No heartburn or reflux  GU: No dysuria, frequency or urgency, or incontinence  MUSCULOSKELETAL: No unrelieved bone/joint pain NEUROLOGIC: No headache, dizziness  PSYCHIATRIC: No overt anxiety or sadness  Vitals:   05/20/19 1640  BP: 114/64  Pulse: 84  Resp: 18  Temp: 98.3 F (36.8 C)   Body mass index is 66.18 kg/m. Physical Exam  GENERAL APPEARANCE: Alert, minimally conversant, No acute distress  SKIN: No diaphoresis rash HEENT: Unremarkable RESPIRATORY: Breathing is even, unlabored. Lung sounds are clear   CARDIOVASCULAR: Heart RRR no murmurs, rubs or gallops. No peripheral edema  GASTROINTESTINAL: Abdomen is soft, non-tender, not distended w/ normal bowel sounds.  GENITOURINARY: Bladder non tender, not distended  MUSCULOSKELETAL: Bilateral AKA NEUROLOGIC: Cranial nerves 2-12 grossly intact. Moves all extremities PSYCHIATRIC: Mood and affect appropriate to situation, no behavioral issues  Patient Active Problem List   Diagnosis Date Noted  . 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 End  stage renal disease on dialysis due to type 2 diabetes mellitus ([place]) 03/24/2019  . 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 Anemia due to end stage renal disease ([place]) 03/24/2019  . Protein-calorie malnutrition, severe ([place]) 03/24/2019  . Hyperlipidemia associated with type 2 diabetes mellitus ([place]) 02/22/2019  . Amputation stump infection ([place]) 02/15/2019  . Conjunctivitis of left eye 01/04/2019  . 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[place]) 12/30/2018  . Open wound of left foot   . Anticoagulation management encounter   . OSA (obstructive sleep apnea)   . HTN (hypertension)   . ESRD on hemodialysis ([place])   . BPH (benign prostatic hyperplasia)   . Hypotension 12/07/2018  . GERD (gastroesophageal reflux disease) 12/07/2018  . Pressure injury of skin 12/03/2018  . Symptomatic anemia 12/02/2018  . Acute on chronic blood loss anemia 12/02/2018  . 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[place]) 12/02/2018  . Lower GI bleeding 12/02/2018  . Heme positive stool   . Acute on chronic diastolic (congestive) heart failure ([place]) 11/19/2018  . Acute CVA (cerebrovascular accident) ([place]) 11/19/2018  . Hypoglycemia 11/19/2018  . Peripheral vascular disease due to secondary diabetes ([place]) 11/19/2018  . Dry gangrene ([place]) 11/19/2018  . 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 DNR (do not resuscitate) discussion   . Goals of care, counseling/discussion   . Palliative care by specialist   . Acute hypoxemic respiratory failure ([place]) 11/06/2018  . Volume overload 11/06/2018  . Multifocal pneumonia 11/06/2018  . Acute metabolic encephalopathy 48/54/6270  . Physical deconditioning 11/06/2018  . Acute respiratory failure with hypoxia and hypercapnia ([place]) 11/04/2018  . CAP (community acquired pneumonia) 11/04/2018  . Pleural effusion, right 11/04/2018  . Increased ammonia level 11/04/2018  . Abnormal CT of the abdomen 11/04/2018  . Atrial fibrillation with RVR  ([place]) 11/04/2018  . Sepsis ([place]) 10/21/2018  . Acute encephalopathy 10/21/2018  . Elevated alkaline phosphatase level 10/21/2018  . Chronic anemia 10/21/2018  . Thrombocytopenia ([place]) 10/21/2018  . Pulmonary HTN ([place]) 03/04/2017  . Aortic valve stenosis 03/04/2017  . Hypertension, accelerated with heart disease, without CHF 01/12/2017  . Dyspnea on exertion 01/12/2017  . ESRD (end stage renal disease) ([place]) 06/07/2012  . Hyperlipidemia 04/24/2011  . HYPERTENSION, BENIGN 10/24/2009  . Type 2 diabetes mellitus with hypertension and end stage renal disease on dialysis ([place]) 10/23/2009  . OBSTRUCTIVE SLEEP APNEA 10/23/2009  . PAF (  paroxysmal atrial fibrillation) ([place]) 10/23/2009  . CHF (congestive heart failure) ([place]) 10/23/2009    CMP     Component Value Date/Time   NA 141 02/27/2019   K 4.6 02/27/2019   CL 101 02/20/2019 1300   CO2 23 02/20/2019 1300   GLUCOSE 101 (H) 02/20/2019 1300   BUN 64 (A) 02/27/2019   CREATININE 5.4 (A) 02/27/2019   CREATININE 3.41 (H) 02/20/2019 1300   CALCIUM 7.7 (L) 02/20/2019 1300   CALCIUM 8.3 (L) 10/25/2008 1450   PROT 7.0 02/15/2019 1242   ALBUMIN 2.1 (L) 02/20/2019 1300   AST 30 02/15/2019 1242   ALT 25 02/15/2019 1242   ALKPHOS 198 (H) 02/15/2019 1242   BILITOT 0.9 02/15/2019 1242   GFRNONAA 16 (L) 02/20/2019 1300   GFRAA 19 (L) 02/20/2019 1300   Recent Labs    11/08/18 0016 11/09/18 0230  11/14/18 0241  01/02/19 1210  02/15/19 2242 02/16/19 0029 02/17/19 1456 02/20/19 1300 02/27/19  NA 138 137   < > 135   < > 137   < > 141 143 139 137 141  K 3.8 4.1   < > 5.0   < > 3.9   < > 4.8 3.8 4.0 3.0* 4.6  CL 100 100   < > 97*   < > 98   < > 102 103 101 101  --   CO2 26 27   < > 25   < > 22   < > 24 26 26 23   --   GLUCOSE 105* 94   < > 139*   < > 175*   < > 106* 104* 142* 101*  --   BUN 15 39*   < > 72*   < > 64*   < > 58* 56* 23 27* 64*  CREATININE 3.19* 5.34*   < > 7.61*   < > 8.36*   < > 6.50* 6.64* 4.46* 3.41* 5.4*  CALCIUM 8.8* 9.1    < > 9.8   < > 7.8*   < > 9.0 9.1 9.0 7.7*  --   MG 2.0 2.2  --  2.4  --   --   --   --   --   --   --   --   PHOS  --   --    < > 2.4*   < > 6.2*  --  4.0  --   --  2.4*  --    < > = values in this interval not displayed.   Recent Labs    12/01/18 2353  12/23/18 1128  02/15/19 1242 02/15/19 2242 02/20/19 1300  AST 195*  --  26  --  30  --   --   ALT 231*  --  20  --  25  --   --   ALKPHOS 293*  --  290*  --  198*  --   --   BILITOT 1.1  --  0.9  --  0.9  --   --   PROT 6.6  --  7.0  --  7.0  --   --   ALBUMIN 2.9*   < > 2.4*   < > 2.2* 2.1* 2.1*   < > = values in this interval not displayed.   Recent Labs    12/23/18 1128 12/24/18 0227  02/15/19 1242  02/16/19 0029 02/17/19 1456 02/20/19 1300 02/27/19  WBC 17.9* 15.6*   < > 14.7*   < > 11.7* 10.1  11.1* 10.4  NEUTROABS 15.5* 13.0*  --  11.8*  --   --   --   --   --   HGB 9.5* 10.5*   < > 10.1*   < > 10.2* 11.2* 9.4* 10.1*  HCT 31.7* 33.5*   < > 35.1*   < > 34.9* 37.9* 32.6* 32*  MCV 100.6* 99.4   < > 90.5   < > 89.7 87.5 88.1  --   PLT 223 204   < > 227   < > 215 229 207 228   < > = values in this interval not displayed.   Recent Labs    11/11/18 1517  CHOL 113  LDLCALC 62  TRIG 60   No results found for: [facility] Lab Results  Component Value Date   TSH 1.956 11/09/2018   Lab Results  Component Value Date   HGBA1C 4.9 02/16/2019   Lab Results  Component Value Date   CHOL 113 11/11/2018   HDL 39 (L) 11/11/2018   LDLCALC 62 11/11/2018   TRIG 60 11/11/2018   CHOLHDL 2.9 11/11/2018    Significant Diagnostic Results in last 30 days:  No results found.  Assessment and Plan  Atrial fibrillation/peripheral vascular disease/chronic anticoagulation- patient's INR this week is 1.8, patient's INR last week was 1.9.  Both weeks patient got an extra 2.5 of Coumadin.  Will increase the Coumadin again for schedule of 5 mg of Coumadin Monday and Friday and 2.5 mg every other day of the week; repeat INR 1 week      Inocencio Homes, MD

## 2019-05-22 ENCOUNTER — Telehealth: Payer: Self-pay | Admitting: Vascular Surgery

## 2019-05-22 NOTE — Telephone Encounter (Signed)
The office will call to arrange a outpatient visit.

## 2019-05-23 ENCOUNTER — Telehealth: Payer: Self-pay | Admitting: *Deleted

## 2019-05-23 NOTE — Telephone Encounter (Signed)
-----   Message from Angelia Mould, MD sent at 05/22/2019 10:42 AM EDT ----- Regarding: f/u visit This patient had bilateral above-the-knee amputations in January of this year by Dr. Servando Snare.  The home health nurse called from [facility] today because there is some drainage from the right above-the-knee amputation.  She would like to have this checked in the office.  The patient dialyzes on Monday Wednesdays and Fridays he will have to be seen on a Tuesday or Thursday.  This could be a physician or the nurse practitioner.  Thank you.  CD

## 2019-05-24 ENCOUNTER — Telehealth (HOSPITAL_COMMUNITY): Payer: Self-pay | Admitting: Rehabilitation

## 2019-05-24 NOTE — Telephone Encounter (Signed)
The above patient or their representative was contacted and gave the following answers to these questions:         Do you have any of the following symptoms? No  Fever                    Cough                   Shortness of breath  Do  you have any of the following other symptoms? No   muscle pain         vomiting,        diarrhea        rash         weakness        red eye        abdominal pain         bruising          bruising or bleeding              joint pain           severe headache    Have you been in contact with someone who was or has been sick in the past 2 weeks? No  Yes                 Unsure                         Unable to assess   Does the person that you were in contact with have any of the following symptoms?   Cough         shortness of breath           muscle pain         vomiting,            diarrhea            rash            weakness           fever            red eye           abdominal pain           bruising  or  bleeding                joint pain                severe headache               Have you  or someone you have been in contact with traveled internationally in th last month? No        If yes, which countries?   Have you  or someone you have been in contact with traveled outside [place] in th last month? No         If yes, which state and city?   COMMENTS OR ACTION PLAN FOR THIS PATIENT:          

## 2019-05-25 ENCOUNTER — Emergency Department (HOSPITAL_COMMUNITY): Payer: Medicare Other

## 2019-05-25 ENCOUNTER — Ambulatory Visit (INDEPENDENT_AMBULATORY_CARE_PROVIDER_SITE_OTHER): Payer: Medicare Other | Admitting: Family

## 2019-05-25 ENCOUNTER — Encounter: Payer: Self-pay | Admitting: Family

## 2019-05-25 ENCOUNTER — Encounter (HOSPITAL_COMMUNITY): Payer: Self-pay

## 2019-05-25 ENCOUNTER — Other Ambulatory Visit: Payer: Self-pay

## 2019-05-25 ENCOUNTER — Inpatient Hospital Stay (HOSPITAL_COMMUNITY)
Admission: EM | Admit: 2019-05-25 | Discharge: 2019-05-31 | DRG: 871 | Disposition: A | Discharge status: Skilled Nursing Facility | Payer: Medicare Other | Source: Ambulatory Visit | Attending: Internal Medicine | Admitting: Internal Medicine

## 2019-05-25 VITALS — BP 84/51 | HR 61 | Temp 100.4°F | Resp 20 | Ht 66.0 in | Wt 122.0 lb

## 2019-05-25 DIAGNOSIS — A419 Sepsis, unspecified organism: Principal | ICD-10-CM | POA: Diagnosis present

## 2019-05-25 DIAGNOSIS — I480 Paroxysmal atrial fibrillation: Secondary | ICD-10-CM | POA: Diagnosis present

## 2019-05-25 DIAGNOSIS — Z89611 Acquired absence of right leg above knee: Secondary | ICD-10-CM | POA: Diagnosis not present

## 2019-05-25 DIAGNOSIS — Z8249 Family history of ischemic heart disease and other diseases of the circulatory system: Secondary | ICD-10-CM

## 2019-05-25 DIAGNOSIS — Z89612 Acquired absence of left leg above knee: Secondary | ICD-10-CM

## 2019-05-25 DIAGNOSIS — T8740 Infection of amputation stump, unspecified extremity: Secondary | ICD-10-CM | POA: Diagnosis present

## 2019-05-25 DIAGNOSIS — S81801A Unspecified open wound, right lower leg, initial encounter: Secondary | ICD-10-CM

## 2019-05-25 DIAGNOSIS — R0989 Other specified symptoms and signs involving the circulatory and respiratory systems: Secondary | ICD-10-CM | POA: Diagnosis present

## 2019-05-25 DIAGNOSIS — B9561 Methicillin susceptible Staphylococcus aureus infection as the cause of diseases classified elsewhere: Secondary | ICD-10-CM | POA: Diagnosis present

## 2019-05-25 DIAGNOSIS — Z20828 Contact with and (suspected) exposure to other viral communicable diseases: Secondary | ICD-10-CM | POA: Diagnosis present

## 2019-05-25 DIAGNOSIS — I959 Hypotension, unspecified: Secondary | ICD-10-CM | POA: Diagnosis not present

## 2019-05-25 DIAGNOSIS — I9589 Other hypotension: Secondary | ICD-10-CM | POA: Diagnosis not present

## 2019-05-25 DIAGNOSIS — I120 Hypertensive chronic kidney disease with stage 5 chronic kidney disease or end stage renal disease: Secondary | ICD-10-CM

## 2019-05-25 DIAGNOSIS — Z9989 Dependence on other enabling machines and devices: Secondary | ICD-10-CM

## 2019-05-25 DIAGNOSIS — F0390 Unspecified dementia without behavioral disturbance: Secondary | ICD-10-CM | POA: Diagnosis present

## 2019-05-25 DIAGNOSIS — E1151 Type 2 diabetes mellitus with diabetic peripheral angiopathy without gangrene: Secondary | ICD-10-CM | POA: Diagnosis present

## 2019-05-25 DIAGNOSIS — G4733 Obstructive sleep apnea (adult) (pediatric): Secondary | ICD-10-CM | POA: Diagnosis present

## 2019-05-25 DIAGNOSIS — A4101 Sepsis due to Methicillin susceptible Staphylococcus aureus: Secondary | ICD-10-CM

## 2019-05-25 DIAGNOSIS — Z7901 Long term (current) use of anticoagulants: Secondary | ICD-10-CM

## 2019-05-25 DIAGNOSIS — D649 Anemia, unspecified: Secondary | ICD-10-CM | POA: Diagnosis present

## 2019-05-25 DIAGNOSIS — G9340 Encephalopathy, unspecified: Secondary | ICD-10-CM | POA: Diagnosis present

## 2019-05-25 DIAGNOSIS — I272 Pulmonary hypertension, unspecified: Secondary | ICD-10-CM | POA: Diagnosis present

## 2019-05-25 DIAGNOSIS — K219 Gastro-esophageal reflux disease without esophagitis: Secondary | ICD-10-CM | POA: Diagnosis present

## 2019-05-25 DIAGNOSIS — T8743 Infection of amputation stump, right lower extremity: Secondary | ICD-10-CM | POA: Diagnosis present

## 2019-05-25 DIAGNOSIS — I132 Hypertensive heart and chronic kidney disease with heart failure and with stage 5 chronic kidney disease, or end stage renal disease: Secondary | ICD-10-CM | POA: Diagnosis present

## 2019-05-25 DIAGNOSIS — Y835 Amputation of limb(s) as the cause of abnormal reaction of the patient, or of later complication, without mention of misadventure at the time of the procedure: Secondary | ICD-10-CM | POA: Diagnosis present

## 2019-05-25 DIAGNOSIS — E8889 Other specified metabolic disorders: Secondary | ICD-10-CM | POA: Diagnosis present

## 2019-05-25 DIAGNOSIS — E11649 Type 2 diabetes mellitus with hypoglycemia without coma: Secondary | ICD-10-CM | POA: Diagnosis present

## 2019-05-25 DIAGNOSIS — R740 Nonspecific elevation of levels of transaminase and lactic acid dehydrogenase [LDH]: Secondary | ICD-10-CM | POA: Diagnosis present

## 2019-05-25 DIAGNOSIS — Z91048 Other nonmedicinal substance allergy status: Secondary | ICD-10-CM | POA: Diagnosis not present

## 2019-05-25 DIAGNOSIS — N186 End stage renal disease: Secondary | ICD-10-CM | POA: Diagnosis present

## 2019-05-25 DIAGNOSIS — D631 Anemia in chronic kidney disease: Secondary | ICD-10-CM | POA: Diagnosis present

## 2019-05-25 DIAGNOSIS — N2581 Secondary hyperparathyroidism of renal origin: Secondary | ICD-10-CM | POA: Diagnosis present

## 2019-05-25 DIAGNOSIS — E785 Hyperlipidemia, unspecified: Secondary | ICD-10-CM | POA: Diagnosis present

## 2019-05-25 DIAGNOSIS — Z820 Family history of epilepsy and other diseases of the nervous system: Secondary | ICD-10-CM

## 2019-05-25 DIAGNOSIS — Z794 Long term (current) use of insulin: Secondary | ICD-10-CM | POA: Diagnosis not present

## 2019-05-25 DIAGNOSIS — Z833 Family history of diabetes mellitus: Secondary | ICD-10-CM

## 2019-05-25 DIAGNOSIS — I779 Disorder of arteries and arterioles, unspecified: Secondary | ICD-10-CM

## 2019-05-25 DIAGNOSIS — Z992 Dependence on renal dialysis: Secondary | ICD-10-CM | POA: Diagnosis not present

## 2019-05-25 DIAGNOSIS — Z803 Family history of malignant neoplasm of breast: Secondary | ICD-10-CM

## 2019-05-25 DIAGNOSIS — Z79899 Other long term (current) drug therapy: Secondary | ICD-10-CM

## 2019-05-25 DIAGNOSIS — Z87891 Personal history of nicotine dependence: Secondary | ICD-10-CM

## 2019-05-25 DIAGNOSIS — E1122 Type 2 diabetes mellitus with diabetic chronic kidney disease: Secondary | ICD-10-CM | POA: Diagnosis present

## 2019-05-25 DIAGNOSIS — G9341 Metabolic encephalopathy: Secondary | ICD-10-CM | POA: Diagnosis present

## 2019-05-25 DIAGNOSIS — I5032 Chronic diastolic (congestive) heart failure: Secondary | ICD-10-CM | POA: Diagnosis present

## 2019-05-25 DIAGNOSIS — R509 Fever, unspecified: Secondary | ICD-10-CM

## 2019-05-25 DIAGNOSIS — I10 Essential (primary) hypertension: Secondary | ICD-10-CM | POA: Diagnosis present

## 2019-05-25 DIAGNOSIS — N400 Enlarged prostate without lower urinary tract symptoms: Secondary | ICD-10-CM | POA: Diagnosis present

## 2019-05-25 DIAGNOSIS — L89152 Pressure ulcer of sacral region, stage 2: Secondary | ICD-10-CM | POA: Diagnosis present

## 2019-05-25 LAB — COMPREHENSIVE METABOLIC PANEL
ALT: 58 U/L — ABNORMAL HIGH (ref 0–44)
AST: 77 U/L — ABNORMAL HIGH (ref 15–41)
Albumin: 2.9 g/dL — ABNORMAL LOW (ref 3.5–5.0)
Alkaline Phosphatase: 288 U/L — ABNORMAL HIGH (ref 38–126)
Anion gap: 17 — ABNORMAL HIGH (ref 5–15)
BUN: 50 mg/dL — ABNORMAL HIGH (ref 8–23)
CO2: 26 mmol/L (ref 22–32)
Calcium: 10.4 mg/dL — ABNORMAL HIGH (ref 8.9–10.3)
Chloride: 95 mmol/L — ABNORMAL LOW (ref 98–111)
Creatinine, Ser: 4.76 mg/dL — ABNORMAL HIGH (ref 0.61–1.24)
GFR calc Af Amer: 13 mL/min — ABNORMAL LOW (ref >60)
GFR calc non Af Amer: 11 mL/min — ABNORMAL LOW (ref >60)
Glucose, Bld: 130 mg/dL — ABNORMAL HIGH (ref 70–99)
Potassium: 4.8 mmol/L (ref 3.5–5.1)
Sodium: 138 mmol/L (ref 135–145)
Total Bilirubin: 1.2 mg/dL (ref 0.3–1.2)
Total Protein: 8.2 g/dL — ABNORMAL HIGH (ref 6.5–8.1)

## 2019-05-25 LAB — TSH: TSH: 1.839 u[IU]/mL (ref 0.350–4.500)

## 2019-05-25 LAB — CBC WITH DIFFERENTIAL/PLATELET
Abs Immature Granulocytes: 0.13 10*3/uL — ABNORMAL HIGH (ref 0.00–0.07)
Basophils Absolute: 0.1 10*3/uL (ref 0.0–0.1)
Basophils Relative: 0 %
Eosinophils Absolute: 0.1 10*3/uL (ref 0.0–0.5)
Eosinophils Relative: 1 %
HCT: 35.1 % — ABNORMAL LOW (ref 39.0–52.0)
Hemoglobin: 10.7 g/dL — ABNORMAL LOW (ref 13.0–17.0)
Immature Granulocytes: 1 %
Lymphocytes Relative: 7 %
Lymphs Abs: 1.2 10*3/uL (ref 0.7–4.0)
MCH: 27.2 pg (ref 26.0–34.0)
MCHC: 30.5 g/dL (ref 30.0–36.0)
MCV: 89.1 fL (ref 80.0–100.0)
Monocytes Absolute: 1.9 10*3/uL — ABNORMAL HIGH (ref 0.1–1.0)
Monocytes Relative: 11 %
Neutro Abs: 14.2 10*3/uL — ABNORMAL HIGH (ref 1.7–7.7)
Neutrophils Relative %: 80 %
Platelets: 251 10*3/uL (ref 150–400)
RBC: 3.94 MIL/uL — ABNORMAL LOW (ref 4.22–5.81)
RDW: 20.3 % — ABNORMAL HIGH (ref 11.5–15.5)
WBC: 17.5 10*3/uL — ABNORMAL HIGH (ref 4.0–10.5)
nRBC: 0 % (ref 0.0–0.2)

## 2019-05-25 LAB — LACTIC ACID, PLASMA
Lactic Acid, Venous: 2.8 mmol/L (ref 0.5–1.9)
Lactic Acid, Venous: 2.2 mmol/L (ref 0.5–1.9)
Lactic Acid, Venous: 1.9 mmol/L (ref 0.5–1.9)
Lactic Acid, Venous: 2 mmol/L (ref 0.5–1.9)

## 2019-05-25 LAB — PROTIME-INR
INR: 2.4 — ABNORMAL HIGH (ref 0.8–1.2)
Prothrombin Time: 25.4 seconds — ABNORMAL HIGH (ref 11.4–15.2)

## 2019-05-25 LAB — SARS CORONAVIRUS 2 BY RT PCR (HOSPITAL ORDER, PERFORMED IN ~~LOC~~ HOSPITAL LAB): SARS Coronavirus 2: NEGATIVE

## 2019-05-25 LAB — GLUCOSE, CAPILLARY: Glucose-Capillary: 108 mg/dL — ABNORMAL HIGH (ref 70–99)

## 2019-05-25 MED ORDER — POLYETHYLENE GLYCOL 3350 17 G PO PACK: 17.0000 g | PACK | Freq: Every day | ORAL | Status: DC | PRN

## 2019-05-25 MED ORDER — DOXERCALCIFEROL 4 MCG/2ML IV SOLN
2.0000 ug | INTRAVENOUS | Status: DC
  Filled 2019-05-25: qty 2

## 2019-05-25 MED ORDER — ONDANSETRON HCL 4 MG PO TABS: 4.0000 mg | ORAL_TABLET | Freq: Four times a day (QID) | ORAL | Status: DC | PRN

## 2019-05-25 MED ORDER — SODIUM CHLORIDE 0.9 % IV BOLUS (SEPSIS)
1000.0000 mL | Freq: Once | INTRAVENOUS | Status: AC
  Administered 2019-05-25: 15:00:00 1000 mL via INTRAVENOUS

## 2019-05-25 MED ORDER — DICYCLOMINE HCL 20 MG PO TABS
20.0000 mg | ORAL_TABLET | Freq: Three times a day (TID) | ORAL | Status: DC
  Administered 2019-05-26 – 2019-05-31 (×15): 20 mg via ORAL
  Filled 2019-05-25 (×15): qty 1

## 2019-05-25 MED ORDER — HYDRALAZINE HCL 20 MG/ML IJ SOLN: 10.0000 mg | INTRAMUSCULAR | Status: DC | PRN

## 2019-05-25 MED ORDER — MIDODRINE HCL 5 MG PO TABS
10.0000 mg | ORAL_TABLET | ORAL | Status: DC
  Administered 2019-05-26 – 2019-05-31 (×2): 10 mg via ORAL
  Filled 2019-05-25 (×2): qty 2

## 2019-05-25 MED ORDER — ACETAMINOPHEN 650 MG RE SUPP: 650.0000 mg | Freq: Four times a day (QID) | RECTAL | Status: DC | PRN

## 2019-05-25 MED ORDER — SODIUM CHLORIDE 0.9 % IV BOLUS (SEPSIS)
500.0000 mL | Freq: Once | INTRAVENOUS | Status: AC
  Administered 2019-05-25: 17:00:00 500 mL via INTRAVENOUS

## 2019-05-25 MED ORDER — METRONIDAZOLE IN NACL 5-0.79 MG/ML-% IV SOLN
500.0000 mg | Freq: Three times a day (TID) | INTRAVENOUS | Status: DC
  Administered 2019-05-25 – 2019-05-30 (×15): 500 mg via INTRAVENOUS
  Filled 2019-05-25 (×15): qty 100

## 2019-05-25 MED ORDER — HALOPERIDOL LACTATE 5 MG/ML IJ SOLN
2.5000 mg | Freq: Once | INTRAMUSCULAR | Status: AC
  Administered 2019-05-25: 2.5 mg via INTRAVENOUS
  Filled 2019-05-25: qty 1

## 2019-05-25 MED ORDER — SODIUM CHLORIDE 0.9 % IV BOLUS (SEPSIS)
250.0000 mL | Freq: Once | INTRAVENOUS | Status: AC
  Administered 2019-05-25: 250 mL via INTRAVENOUS

## 2019-05-25 MED ORDER — ADULT MULTIVITAMIN W/MINERALS CH
1.0000 | ORAL_TABLET | Freq: Every day | ORAL | Status: DC
  Administered 2019-05-26 – 2019-05-29 (×4): 1 via ORAL
  Filled 2019-05-25 (×4): qty 1

## 2019-05-25 MED ORDER — SENNOSIDES-DOCUSATE SODIUM 8.6-50 MG PO TABS: 1.0000 | ORAL_TABLET | Freq: Every evening | ORAL | Status: DC | PRN

## 2019-05-25 MED ORDER — WARFARIN SODIUM 2.5 MG PO TABS: 2.5000 mg | ORAL_TABLET | Freq: Every day | ORAL | Status: DC

## 2019-05-25 MED ORDER — METOPROLOL TARTRATE 25 MG PO TABS
25.0000 mg | ORAL_TABLET | ORAL | Status: DC
  Administered 2019-05-29: 25 mg via ORAL
  Filled 2019-05-25 (×4): qty 1

## 2019-05-25 MED ORDER — METOPROLOL TARTRATE 25 MG PO TABS
25.0000 mg | ORAL_TABLET | ORAL | Status: DC
  Administered 2019-05-27 – 2019-05-30 (×5): 25 mg via ORAL
  Filled 2019-05-25 (×2): qty 1

## 2019-05-25 MED ORDER — PRO-STAT SUGAR FREE PO LIQD
30.0000 mL | Freq: Two times a day (BID) | ORAL | Status: DC
  Administered 2019-05-25 – 2019-05-31 (×10): 30 mL via ORAL
  Filled 2019-05-25 (×10): qty 30

## 2019-05-25 MED ORDER — ONDANSETRON HCL 4 MG/2ML IJ SOLN: 4.0000 mg | Freq: Four times a day (QID) | INTRAMUSCULAR | Status: DC | PRN

## 2019-05-25 MED ORDER — VANCOMYCIN VARIABLE DOSE PER UNSTABLE RENAL FUNCTION (PHARMACIST DOSING): Status: DC

## 2019-05-25 MED ORDER — SENNOSIDES-DOCUSATE SODIUM 8.6-50 MG PO TABS: 2.0000 | ORAL_TABLET | Freq: Every evening | ORAL | Status: DC | PRN

## 2019-05-25 MED ORDER — VANCOMYCIN HCL 10 G IV SOLR
1250.0000 mg | Freq: Once | INTRAVENOUS | Status: AC
  Administered 2019-05-25: 16:00:00 1250 mg via INTRAVENOUS
  Filled 2019-05-25: qty 1250

## 2019-05-25 MED ORDER — SODIUM CHLORIDE 0.9 % IV SOLN: 2.0000 g | Freq: Once | INTRAVENOUS | Status: DC

## 2019-05-25 MED ORDER — WARFARIN SODIUM 2.5 MG PO TABS
2.5000 mg | ORAL_TABLET | ORAL | Status: AC
  Administered 2019-05-25: 22:00:00 2.5 mg via ORAL
  Filled 2019-05-25: qty 1

## 2019-05-25 MED ORDER — ACETAMINOPHEN 650 MG RE SUPP
650.0000 mg | Freq: Once | RECTAL | Status: AC
  Administered 2019-05-25: 15:00:00 650 mg via RECTAL
  Filled 2019-05-25: qty 1

## 2019-05-25 MED ORDER — CALCIUM ACETATE (PHOS BINDER) 667 MG PO CAPS
1334.0000 mg | ORAL_CAPSULE | Freq: Three times a day (TID) | ORAL | Status: DC
  Administered 2019-05-26 – 2019-05-31 (×14): 1334 mg via ORAL
  Filled 2019-05-25 (×14): qty 2

## 2019-05-25 MED ORDER — INSULIN ASPART 100 UNIT/ML ~~LOC~~ SOLN
0.0000 [IU] | Freq: Three times a day (TID) | SUBCUTANEOUS | Status: DC
  Administered 2019-05-26 – 2019-05-29 (×6): 1 [IU] via SUBCUTANEOUS

## 2019-05-25 MED ORDER — PANTOPRAZOLE SODIUM 40 MG PO TBEC
40.0000 mg | DELAYED_RELEASE_TABLET | Freq: Every day | ORAL | Status: DC
  Administered 2019-05-26 – 2019-05-31 (×6): 40 mg via ORAL
  Filled 2019-05-25 (×6): qty 1

## 2019-05-25 MED ORDER — WARFARIN - PHARMACIST DOSING INPATIENT: Freq: Every day | Status: DC

## 2019-05-25 MED ORDER — HYDROCODONE-ACETAMINOPHEN 5-325 MG PO TABS
1.0000 | ORAL_TABLET | ORAL | Status: DC | PRN
  Administered 2019-05-25: 1 via ORAL
  Filled 2019-05-25: qty 1

## 2019-05-25 MED ORDER — VANCOMYCIN HCL IN DEXTROSE 1-5 GM/200ML-% IV SOLN: 1000.0000 mg | Freq: Once | INTRAVENOUS | Status: DC

## 2019-05-25 MED ORDER — SODIUM CHLORIDE 0.9 % IV SOLN
2.0000 g | Freq: Once | INTRAVENOUS | Status: AC
  Administered 2019-05-25: 15:00:00 2 g via INTRAVENOUS
  Filled 2019-05-25: qty 2

## 2019-05-25 MED ORDER — LOPERAMIDE HCL 2 MG PO TABS: 4.0000 mg | ORAL_TABLET | ORAL | Status: DC

## 2019-05-25 MED ORDER — FERROUS GLUCONATE 324 (38 FE) MG PO TABS
324.0000 mg | ORAL_TABLET | Freq: Three times a day (TID) | ORAL | Status: DC
  Administered 2019-05-25 – 2019-05-29 (×11): 324 mg via ORAL
  Filled 2019-05-25 (×11): qty 1

## 2019-05-25 MED ORDER — CEFAZOLIN SODIUM-DEXTROSE 2-4 GM/100ML-% IV SOLN: 2.0000 g | Freq: Once | INTRAVENOUS | Status: DC

## 2019-05-25 MED ORDER — ACETAMINOPHEN 325 MG PO TABS: 650.0000 mg | ORAL_TABLET | Freq: Four times a day (QID) | ORAL | Status: DC | PRN

## 2019-05-25 NOTE — ED Triage Notes (Signed)
Pt sent here from the vascular office for evaluation of possible sepsis. 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 Bp at office was 84/51 and temp was 100.4. Pt arrives to ed alert, BP 97/50. Dialysis pt MWF. Lives at [facility].

## 2019-05-25 NOTE — ED Notes (Signed)
ED TO INPATIENT HANDOFF REPORT  ED Nurse Name and Phone #:  [phone]  S Name/Age/Gender [name] 78 y.o. male Room/Bed: 017C/017C  Code Status   Code Status: Prior  Home/SNF/Other Skilled nursing facility Patient oriented to: self, place and situation Is this baseline? Yes   Triage Complete: Triage complete  Chief Complaint Possible Sepsis  Triage Note  Pt sent here from the vascular office for evaluation of possible sepsis. 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 Bp at office was 84/51 and temp was 100.4. Pt arrives to ed alert, BP 97/50. Dialysis pt MWF. Lives at [facility].   Allergies Allergies  Allergen Reactions  . Occlusive Silicone Sheets [Silicone] Other (See Comments)    Unknown reaction (listed as 'occlusive adhesive' on [facility] 02/15/2019)  . Other Other (See Comments)    Unknown reaction to Occlusive adhesive  . Tape Itching    Use Cloth tape only    Level of Care/Admitting Diagnosis ED Disposition    ED Disposition Condition [facility]: [place] [[id number]]  Level of Care: Med-Surg [16]  Covid Evaluation: Confirmed COVID Negative  Diagnosis: Sepsis [facility]) [[id number]]  Admitting Physician: Gerlean Ren [facility] [[id number]]  Attending Physician: Gerlean Ren [facility] [[id number]]  Estimated length of stay: past midnight tomorrow  Certification:: I certify this patient will need inpatient services for at least 2 midnights  PT Class (Do Not Modify): Inpatient [101]  PT Acc Code (Do Not Modify): Private [1]       B Medical/Surgery History Past Medical History:  Diagnosis Date  . A-fib ([place])   . Anemia   . Blood transfusion   . BPH (benign prostatic hyperplasia)   . CHF (congestive heart failure) ([place])   . Diarrhea   . DM (diabetes mellitus) ([place])   . ESRD on hemodialysis ([place])    Started dialysis in 2009  . History of  GI bleed    secondary to coumadin  . HTN (hypertension)   . Hyperlipidemia   . OSA (obstructive sleep apnea)    uses CPAP  . Secondary hyperparathyroidism of renal origin [facility])    Past Surgical History:  Procedure Laterality Date  . ABDOMINAL AORTOGRAM N/A 11/15/2018   Procedure: ABDOMINAL AORTOGRAM;  Surgeon: [name], MD;  Location: [place] CV [facility];  Service: Cardiovascular;  Laterality: N/A;  . AMPUTATION Left 12/06/2018   Procedure: AMPUTATION DIGIT LEFT FIFTH TOE;  Surgeon: Angelia Mould, MD;  Location: [place];  Service: Vascular;  Laterality: Left;  . AMPUTATION Bilateral 12/29/2018   Procedure: AMPUTATION ABOVE KNEE;  Surgeon: Waynetta Sandy, MD;  Location: [place];  Service: Vascular;  Laterality: Bilateral;  . APPLICATION OF WOUND VAC Right 02/17/2019   Procedure: Application Of Wound Vac;  Surgeon: Marty Heck, MD;  Location: [place];  Service: Vascular;  Laterality: Right;  . BVT  3/49/17   Left  Basilic Vein Transposition  . CHOLECYSTECTOMY    . EYE SURGERY     Catarct bil  . I&D EXTREMITY Right 02/17/2019   Procedure: Right above the kneee debridement;  Surgeon: Marty Heck, MD;  Location: [place];  Service: Vascular;  Laterality: Right;  . INSERTION OF DIALYSIS CATHETER  05/28/2012   Procedure: INSERTION OF DIALYSIS CATHETER;  Surgeon: Mal Misty, MD;  Location: [place];  Service: Vascular;  Laterality:  Right;  . Left arm shuntogram.    . Left forearm loop graft with 6 mm Gore-Tex graft.    . LOWER EXTREMITY ANGIOGRAPHY Bilateral 11/15/2018   Procedure: LOWER EXTREMITY ANGIOGRAPHY;  Surgeon: [name], MD;  Location: [place] CV LAB;  Service: Cardiovascular;  Laterality: Bilateral;  . Pars plana vitrectomy with 25-gauge system    . PERIPHERAL VASCULAR ATHERECTOMY Left 11/15/2018   Procedure: PERIPHERAL VASCULAR ATHERECTOMY;  Surgeon: [name], MD;  Location: [place] CV LAB;  Service: Cardiovascular;   Laterality: Left;  SFA with STENT  . PERIPHERAL VASCULAR BALLOON ANGIOPLASTY Left 11/15/2018   Procedure: PERIPHERAL VASCULAR BALLOON ANGIOPLASTY;  Surgeon: [name], MD;  Location: [place] CV [facility];  Service: Cardiovascular;  Laterality: Left;  PT     A IV Location/Drains/Wounds Patient Lines/Drains/Airways Status   Active Line/Drains/Airways    Name:   Placement date:   Placement time:   Site:   Days:   Peripheral IV 05/25/19 Right Antecubital   05/25/19    1445    Antecubital   less than 1   Vascular Access Left Upper arm Arteriovenous fistula   06/16/12    0833    Upper arm   2534   Incision (Closed) 12/29/18 Thigh Right;Left   12/29/18    1512     147   Incision (Closed) 12/30/18 Leg Right;Circumferential;Lower   12/30/18    1630     146   Incision (Closed) 12/30/18 Leg Left;Circumferential;Lower   12/30/18    1630     146   Pressure Injury 02/15/19 Stage II -  Partial thickness loss of dermis presenting as a shallow open ulcer with a red, pink wound bed without slough. 6cm in length,2cm in width   02/15/19    2300     99   Wound / Incision (Open or Dehisced) 02/15/19 Incision - Open Other (Comment) Right   02/15/19    2300    Other (Comment)   99          Intake/Output Last 24 hours No intake or output data in the 24 hours ending 05/25/19 1808  Labs/Imaging Results for orders placed or performed during the hospital encounter of 05/25/19 (from the past 48 hour(s))  Lactic acid, plasma     Status: Abnormal   Collection Time: 05/25/19  2:21 PM  Result Value Ref Range   Lactic Acid, Venous 2.8 (HH) 0.5 - 1.9 mmol/L    Comment: CRITICAL RESULT CALLED TO, READ BACK BY AND VERIFIED WITH: MCCORD,S RN @1541  ON [id number] BY FLEMINGS Performed at [facility], [address] [place], [place] [address]   Comprehensive metabolic panel     Status: Abnormal   Collection Time: 05/25/19  2:21 PM  Result Value Ref Range   Sodium 138 135 - 145 mmol/L   Potassium 4.8 3.5 -  5.1 mmol/L   Chloride 95 (L) 98 - 111 mmol/L   CO2 26 22 - 32 mmol/L   Glucose, Bld 130 (H) 70 - 99 mg/dL   BUN 50 (H) 8 - 23 mg/dL   Creatinine, Ser 4.76 (H) 0.61 - 1.24 mg/dL   Calcium 10.4 (H) 8.9 - 10.3 mg/dL   Total Protein 8.2 (H) 6.5 - 8.1 g/dL   Albumin 2.9 (L) 3.5 - 5.0 g/dL   AST 77 (H) 15 - 41 U/L   ALT 58 (H) 0 - 44 U/L   Alkaline Phosphatase 288 (H) 38 - 126 U/L  Total Bilirubin 1.2 0.3 - 1.2 mg/dL   GFR calc non Af Amer 11 (L) >60 mL/min   GFR calc Af Amer 13 (L) >60 mL/min   Anion gap 17 (H) 5 - 15    Comment: Performed at [facility] [address] [place], [place] [address]  CBC WITH DIFFERENTIAL     Status: Abnormal   Collection Time: 05/25/19  2:21 PM  Result Value Ref Range   WBC 17.5 (H) 4.0 - 10.5 K/uL   RBC 3.94 (L) 4.22 - 5.81 MIL/uL   Hemoglobin 10.7 (L) 13.0 - 17.0 g/dL   HCT 35.1 (L) 39.0 - 52.0 %   MCV 89.1 80.0 - 100.0 fL   MCH 27.2 26.0 - 34.0 pg   MCHC 30.5 30.0 - 36.0 g/dL   RDW 20.3 (H) 11.5 - 15.5 %   Platelets 251 150 - 400 K/uL   nRBC 0.0 0.0 - 0.2 %   Neutrophils Relative % 80 %   Neutro Abs 14.2 (H) 1.7 - 7.7 K/uL   Lymphocytes Relative 7 %   Lymphs Abs 1.2 0.7 - 4.0 K/uL   Monocytes Relative 11 %   Monocytes Absolute 1.9 (H) 0.1 - 1.0 K/uL   Eosinophils Relative 1 %   Eosinophils Absolute 0.1 0.0 - 0.5 K/uL   Basophils Relative 0 %   Basophils Absolute 0.1 0.0 - 0.1 K/uL   Immature Granulocytes 1 %   Abs Immature Granulocytes 0.13 (H) 0.00 - 0.07 K/uL    Comment: Performed at [facility], [address], [place], [place] [address]  SARS Coronavirus 2 (CEPHEID - Performed in [place] hospital lab), Hosp Order     Status: None   Collection Time: 05/25/19  3:03 PM  Result Value Ref Range   SARS Coronavirus 2 NEGATIVE NEGATIVE    Comment: (NOTE) If result is NEGATIVE SARS-CoV-2 target nucleic acids are NOT DETECTED. The SARS-CoV-2 RNA is generally detectable in upper and lower  respiratory specimens during the  acute phase of infection. The lowest  concentration of SARS-CoV-2 viral copies this assay can detect is 250  copies / mL. A negative result does not preclude SARS-CoV-2 infection  and should not be used as the sole basis for treatment or other  patient management decisions.  A negative result may occur with  improper specimen collection / handling, submission of specimen other  than nasopharyngeal swab, presence of viral mutation(s) within the  areas targeted by this assay, and inadequate number of viral copies  (<250 copies / mL). A negative result must be combined with clinical  observations, patient history, and epidemiological information. If result is POSITIVE SARS-CoV-2 target nucleic acids are DETECTED. The SARS-CoV-2 RNA is generally detectable in upper and lower  respiratory specimens dur ing the acute phase of infection.  Positive  results are indicative of active infection with SARS-CoV-2.  Clinical  correlation with patient history and other diagnostic information is  necessary to determine patient infection status.  Positive results do  not rule out bacterial infection or co-infection with other viruses. If result is PRESUMPTIVE POSTIVE SARS-CoV-2 nucleic acids MAY BE PRESENT.   A presumptive positive result was obtained on the submitted specimen  and confirmed on repeat testing.  While 2019 novel coronavirus  (SARS-CoV-2) nucleic acids may be present in the submitted sample  additional confirmatory testing may be necessary for epidemiological  and / or clinical management purposes  to differentiate between  SARS-CoV-2 and other Sarbecovirus currently known  to infect humans.  If clinically indicated additional testing with an alternate test  methodology [phone]) is advised. The SARS-CoV-2 RNA is generally  detectable in upper and lower respiratory sp ecimens during the acute  phase of infection. The expected result is Negative. Fact Sheet for Patients:   StrictlyIdeas.no Fact Sheet for Healthcare Providers: BankingDealers.co.za This test is not yet approved or cleared by the [place] FDA and has been authorized for detection and/or diagnosis of SARS-CoV-2 by FDA under an Emergency Use Authorization (EUA).  This EUA will remain in effect (meaning this test can be used) for the duration of the COVID-19 declaration under Section 564(b)(1) of the Act, 21 U.S.C. section 360bbb-3(b)(1), unless the authorization is terminated or revoked sooner. Performed at [facility], [place] [address] [place], [place] [address]   Lactic acid, plasma     Status: Abnormal   Collection Time: 05/25/19  4:21 PM  Result Value Ref Range   Lactic Acid, Venous 2.2 (HH) 0.5 - 1.9 mmol/L    Comment: CRITICAL RESULT CALLED TO, READ BACK BY AND VERIFIED WITH: Orson Gear 1754 05/25/2019 D BRADLEY Performed at [facility], [place] [address] [place], [place] [address]    Dg Chest Port 1 View  Result Date: 05/25/2019 CLINICAL DATA:  Sepsis. EXAM: PORTABLE CHEST 1 VIEW COMPARISON:  Single-view of the chest 02/15/2019 and 10/22/2018. PA and lateral chest 12/27/2018. FINDINGS: Lung volumes are low with some crowding of the bronchovascular structures. Mild vascular congestion is seen. Heart size is upper normal. Aortic atherosclerosis noted. No pneumothorax or pleural effusion. IMPRESSION: Mild pulmonary vascular congestion.  No focal airspace disease. Atherosclerosis. Electronically Signed   By: Inge Rise M.D.   On: 05/25/2019 15:51   Dg Femur Portable Min 2 Views Right  Result Date: 05/25/2019 CLINICAL DATA:  78 year old male with a history of potential osteomyelitis EXAM: RIGHT FEMUR PORTABLE 2 VIEW COMPARISON:  02/15/2019 FINDINGS: Surgical changes of right above knee amputation. There are no erosive changes at the osteotomy site. There has been development of a small bone spur on the medial aspect of the  distal cortex, in the region of the previous MRI findings. Triangular shaped focus of heterotopic bone within the soft tissues on the oblique image. No subcutaneous gas or focal soft tissue swelling identified. Vascular calcifications. IMPRESSION: No erosive changes at the right femur osteotomy site that would suggest acute osteomyelitis. Focal bone spur and heterotopic ossification within the soft tissues, potentially postoperative changes versus chronic reactive changes. Electronically Signed   By: Corrie Mckusick D.O.   On: 05/25/2019 15:54    Pending Labs Unresulted Labs (From admission, onward)    Start     Ordered   05/26/19 0500  Procalcitonin  Tomorrow morning,   R     05/25/19 1739   05/26/19 0500  Comprehensive metabolic panel  Daily,   R    Question:  Specimen collection method  Answer:  Lab=Lab collect   05/25/19 1740   05/26/19 0500  CBC  Daily,   R    Question:  Specimen collection method  Answer:  Lab=Lab collect   05/25/19 1740   05/26/19 0500  Magnesium  Daily,   R    Question:  Specimen collection method  Answer:  Lab=Lab collect   05/25/19 1740   05/25/19 1741  TSH  Once,   R    Question:  Specimen collection method  Answer:  Lab=Lab collect   05/25/19 1740   05/25/19 1739  Lactic acid, plasma  STAT Now then every 3 hours,   STAT     05/25/19 1739   05/25/19 1421  Blood Culture (routine x 2)  BLOOD CULTURE X 2,   STAT     05/25/19 1422   05/25/19 1421  Urinalysis, Routine w reflex microscopic  ONCE - STAT,   STAT     05/25/19 1422          Vitals/Pain Today's Vitals   05/25/19 1414 05/25/19 1422 05/25/19 1423 05/25/19 1502  BP:  (!) 97/50    Pulse:  92    Resp:  (!) 22    Temp:  98.8 F (37.1 C)  100.3 F (37.9 C)  TempSrc:  Oral  Rectal  SpO2: 96% 99%    PainSc:   0-No pain     Isolation Precautions No active isolations  Medications Medications  sodium chloride 0.9 % bolus 1,000 mL (0 mLs Intravenous Stopped 05/25/19 1700)    And  sodium chloride  0.9 % bolus 500 mL (500 mLs Intravenous New Bag/Given 05/25/19 1701)    And  sodium chloride 0.9 % bolus 250 mL (has no administration in time range)  vancomycin variable dose per unstable renal function (pharmacist dosing) (has no administration in time range)  metroNIDAZOLE (FLAGYL) IVPB 500 mg (has no administration in time range)  polyethylene glycol (MIRALAX / GLYCOLAX) packet 17 g (has no administration in time range)  senna-docusate (Senokot-S) tablet 2 tablet (has no administration in time range)  hydrALAZINE (APRESOLINE) injection 10 mg (has no administration in time range)  acetaminophen (TYLENOL) suppository 650 mg (650 mg Rectal Given 05/25/19 1501)  ceFEPIme (MAXIPIME) 2 g in sodium chloride 0.9 % 100 mL IVPB (0 g Intravenous Stopped 05/25/19 1623)  vancomycin (VANCOCIN) 1,250 mg in sodium chloride 0.9 % 250 mL IVPB (1,250 mg Intravenous New Bag/Given 05/25/19 1622)    Mobility non-ambulatory High fall risk   Focused Assessments Renal Assessment Handoff:  Hemodialysis Schedule: Hemodialysis Schedule: Monday/Wednesday/Friday Last Hemodialysis date and time: 2019-05-24   Restricted appendage: left arm     R Recommendations: See Admitting Provider Note  Report given to:   Additional Notes:  COVID negative,  Draining wound right AKA incision.  Stage 2 Buttocks.

## 2019-05-25 NOTE — Progress Notes (Addendum)
[place] for warfarin Indication: atrial fibrillation   Assessment: 81 yom on warfarin PTA for afib presenting with confusion and fever. Pharmacy consulted to dose warfarin inpatient. INR therapeutic at 2.4 on admit. Hg 10.7, plt wnl No active bleed issues documented.  Noted DDI with Flagyl - watch INR closely.  PTA warfarin dose: 2.5 mg daily per last afib/warfarin nursing home note 05/18/19 (last dose 5/27 PTA)  Goal of Therapy:  INR 2-3 Monitor platelets by anticoagulation protocol: Yes   Plan:  Warfarin 2.5mg  PO x 1 dose Daily INR - watch closely on Flagyl Monitor CBC, s/sx bleeding, DDI  Elicia Lamp, PharmD, BCPS Clinical Pharmacist 05/25/2019 6:47 PM

## 2019-05-25 NOTE — H&P (Signed)
History and Physical    [name] ZCH:[id number] DOB: November 07, 1941 DOA: 05/25/2019  PCP: Hennie Duos, MD Patient coming from: Sent by vein and vascular specialist  Chief Complaint: Confusion and fever  HPI: [name] is a 78 y.o. 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  Apparently home health nurse had called from abdomen forearms and vascular and vein specialist office with concerns of some drainage from the right above-the-knee amputation therefore vascular clinic had advised by home nurse to send the patient to the ED for further evaluation.  In the ER patient was noted to be slightly confused with labs suggestive of lactic acidosis, transaminitis and leukocytosis.  Concerns for sepsis.  Chest x-ray showed slight cardiac vascular congestion.  Was evaluated by vascular surgery who recommended medical management for infectious etiology with no acute indication for surgical intervention.  Patient was admitted to medicine for further intervention.  Review of Systems: As per HPI otherwise 10 point review of systems negative.  Review of Systems Otherwise negative except as per HPI, including: General: Denies fever, chills, night sweats or unintended weight loss. Resp: Denies cough, wheezing, shortness of breath. Cardiac: Denies chest pain, palpitations, orthopnea, paroxysmal nocturnal dyspnea. GI: Denies abdominal pain, nausea, vomiting, diarrhea or constipation GU: Denies dysuria, frequency, hesitancy or incontinence MS: Denies muscle aches, joint pain or swelling Neuro: Denies headache, neurologic deficits (focal weakness, numbness, tingling), abnormal gait Psych: Denies anxiety, depression, SI/HI/AVH Skin: Denies new rashes or lesions  ID: Denies sick contacts, exotic exposures, travel  Past Medical History:  Diagnosis Date  . A-fib ([place])   . Anemia   . Blood transfusion   . BPH (benign prostatic hyperplasia)   . CHF (congestive heart failure) ([place])   . Diarrhea   . DM (diabetes mellitus) ([place])   . ESRD on hemodialysis ([place])    Started dialysis in 2009  . History of GI bleed    secondary to coumadin  . HTN (hypertension)   . Hyperlipidemia   . OSA (obstructive sleep apnea)    uses CPAP  . Secondary hyperparathyroidism of renal origin [facility])     Past Surgical History:  Procedure Laterality Date  . ABDOMINAL AORTOGRAM N/A 11/15/2018   Procedure: ABDOMINAL AORTOGRAM;  Surgeon: [name], MD;  Location: [place] [facility];  Service: Cardiovascular;  Laterality: N/A;  . AMPUTATION Left 12/06/2018   Procedure: AMPUTATION DIGIT LEFT FIFTH TOE;  Surgeon: Angelia Mould, MD;  Location: [place];  Service: Vascular;  Laterality: Left;  . AMPUTATION Bilateral 12/29/2018   Procedure: AMPUTATION ABOVE KNEE;  Surgeon: Waynetta Sandy, MD;  Location: [facility];  Service: Vascular;  Laterality: Bilateral;  . APPLICATION OF WOUND VAC Right 02/17/2019   Procedure: Application Of Wound Vac;  Surgeon: Marty Heck, MD;  Location: [place];  Service: Vascular;  Laterality: Right;  . BVT  2/87/86   Left  Basilic Vein Transposition  . CHOLECYSTECTOMY    . EYE SURGERY     Catarct bil  . I&D EXTREMITY Right 02/17/2019   Procedure: Right above the kneee debridement;  Surgeon: Marty Heck, MD;  Location: [place];  Service: Vascular;  Laterality: Right;  . INSERTION OF DIALYSIS CATHETER  05/28/2012   Procedure: INSERTION OF DIALYSIS CATHETER;  Surgeon: Mal Misty, MD;  Location: [facility];  Service: Vascular;  Laterality: Right;  . Left arm shuntogram.    . Left forearm loop graft with 6 mm Gore-Tex graft.    . LOWER EXTREMITY ANGIOGRAPHY Bilateral 11/15/2018   Procedure: LOWER EXTREMITY ANGIOGRAPHY;   Surgeon: [name], MD;  Location: [facility];  Service: Cardiovascular;  Laterality: Bilateral;  . Pars plana vitrectomy with 25-gauge system    . PERIPHERAL VASCULAR ATHERECTOMY Left 11/15/2018   Procedure: PERIPHERAL VASCULAR ATHERECTOMY;  Surgeon: [name], MD;  Location: [place] [facility];  Service: Cardiovascular;  Laterality: Left;  SFA with STENT  . PERIPHERAL VASCULAR BALLOON ANGIOPLASTY Left 11/15/2018   Procedure: PERIPHERAL VASCULAR BALLOON ANGIOPLASTY;  Surgeon: [name], MD;  Location: [place] [facility];  Service: Cardiovascular;  Laterality: Left;  PT    SOCIAL HISTORY:  reports that he quit smoking about 14 years ago. His smoking use included cigarettes. He has never used smokeless tobacco. He reports that he does not drink alcohol or use drugs.  Allergies  Allergen Reactions  . Occlusive Silicone Sheets [Silicone] Other (See Comments)    Unknown reaction (listed as 'occlusive adhesive' on [facility] 02/15/2019)  . Other Other (See Comments)    Unknown reaction to Occlusive adhesive  . Tape Itching    Use Cloth tape only    FAMILY HISTORY: Family History  Problem Relation Age of Onset  . Alzheimer's disease Mother   . Diabetes Father        Amputation:  bilateral legs  . Cancer Daughter        breast cancer  . Diabetes Son   . Heart disease Son        before age 3  . Hypertension Son   . Anesthesia problems Neg Hx      Prior to Admission medications   Medication Sig Start Date End Date Taking? Authorizing Provider  acetaminophen (TYLENOL) 500 MG tablet Take 1,000 mg by mouth 3 (three) times daily.     [provider]  Amino Acids-Protein Hydrolys (FEEDING SUPPLEMENT, PRO-STAT SUGAR FREE 64,) LIQD Take 30 mLs by mouth 2 (two) times daily. 9a and 5p    [provider]  atorvastatin (LIPITOR) 20 MG tablet Take 20 mg by mouth daily.    [provider]  atorvastatin (LIPITOR) 20 MG tablet Take 20 mg by mouth  daily.    [provider]  B Complex-C-Folic Acid (RENAL VITAMIN PO) Take 1 tablet by mouth at bedtime.     [provider]  calcium acetate (PHOSLO) 667 MG capsule Take 1,334 mg by mouth 3 (three) times daily with meals.    [provider]  dicyclomine (BENTYL) 20 MG tablet Take 20 mg by mouth 3 (three) times daily before meals.    [provider]  doxercalciferol (HECTOROL) 4 MCG/2ML injection Inject 1 mL (2 mcg total) into the vein every Monday, Wednesday, and Friday with hemodialysis. 12/05/18   Nita Sells, MD  ferrous gluconate (FERGON) 240 (27 FE) MG tablet Take 1 tablet (240 mg total) by mouth 3 (three) times daily with meals. 12/04/18   Nita Sells, MD  furosemide (LASIX) 40 MG tablet Take 40 mg by mouth daily.    [provider]  loperamide (IMODIUM A-D) 2 MG tablet Take 4 mg by mouth See admin instructions. Take 2 tablets ( 4 mg) by mouth  after 1st loose stool as needed for diarrhea    [provider]  metoprolol tartrate (LOPRESSOR) 25 MG tablet Take  25 mg by mouth See admin instructions. Take one tablet (25 mg) by mouth twice daily on Tuesday, Thursday, Saturday and Sunday (non-dialysis days); take one tablet (25 mg) by mouth on Monday, Wednesday, Friday nights (dialysis days)    [provider]  midodrine (PROAMATINE) 10 MG tablet Take 10 mg by mouth See admin instructions. Take one tablet (10 mg) by mouth on Monday, Wednesday, Friday 30 minutes prior to dialysis.    [provider]  Multiple Vitamin (MULTIVITAMIN WITH MINERALS) TABS tablet Take 1 tablet by mouth daily.    [provider]  Nutritional Supplements (ADULT NUTRITIONAL SUPPLEMENT PO) Take by mouth. Magic cup with dinner    [provider]  pantoprazole (PROTONIX) 40 MG tablet Take 1 tablet (40 mg total) by mouth daily. 01/01/19   Domenic Polite, MD  warfarin (COUMADIN) 2.5 MG tablet Take 2.5 mg by mouth daily.    [provider]    Physical Exam: Vitals:   05/25/19 1730 05/25/19 1745 05/25/19 1800 05/25/19 1815  BP: (!) 120/58 116/65 125/71 127/64  Pulse:      Resp: (!) 30 18 (!) 32 19  Temp:      TempSrc:      SpO2:          Constitutional: NAD, calm, comfortable Eyes: PERRL, lids and conjunctivae normal ENMT: Mucous membranes are moist. Posterior pharynx clear of any exudate or lesions.Normal dentition.  Neck: normal, supple, no masses, no thyromegaly Respiratory: clear to auscultation bilaterally, no wheezing, no crackles. Normal respiratory effort. No accessory muscle use.  Cardiovascular: Regular rate and rhythm, no murmurs / rubs / gallops. No extremity edema. 2+ pedal pulses. No carotid bruits.  Abdomen: no tenderness, no masses palpated. No hepatosplenomegaly. Bowel sounds positive.  Musculoskeletal: Bilateral above-the-knee amputations Skin: Slight drainage from the granulation site of the right lower extremity stump.  Left upper extremity AV fistula noted. Neurologic: CN 2-12 grossly intact. Sensation intact, DTR normal. Strength 5/5 in all 4.  Psychiatric: Poor judgment.  Awake and alert-oriented to name place and time but slightly confused about the timeline    Labs on Admission: I have personally reviewed following labs and imaging studies  CBC: Recent Labs  Lab 05/25/19 1421  WBC 17.5*  NEUTROABS 14.2*  HGB 10.7*  HCT 35.1*  MCV 89.1  PLT 341   Basic Metabolic Panel: Recent Labs  Lab 05/25/19 1421  NA 138  K 4.8  CL 95*  CO2 26  GLUCOSE 130*  BUN 50*  CREATININE 4.76*  CALCIUM 10.4*   GFR: Estimated Creatinine Clearance: 10 mL/min (A) (by C-G formula based on SCr of 4.76 mg/dL (H)). Liver Function Tests: Recent Labs  Lab 05/25/19 1421  AST 77*  ALT 58*  ALKPHOS 288*  BILITOT 1.2  PROT 8.2*  ALBUMIN 2.9*   No results for input(s): LIPASE, AMYLASE in the last 168 hours. No results for input(s): AMMONIA in the last 168 hours. Coagulation  Profile: No results for input(s): INR, PROTIME in the last 168 hours. Cardiac Enzymes: No results for input(s): CKTOTAL, CKMB, CKMBINDEX, TROPONINI in the last 168 hours. BNP (last 3 results) No results for input(s): PROBNP in the last 8760 hours. HbA1C: No results for input(s): HGBA1C in the last 72 hours. CBG: No results for input(s): GLUCAP in the last 168 hours. Lipid Profile: No results for input(s): CHOL, HDL, LDLCALC, TRIG, CHOLHDL, LDLDIRECT in the last 72 hours. Thyroid Function Tests: No results for input(s): TSH, T4TOTAL, FREET4, T3FREE, THYROIDAB in the  last 72 hours. Anemia Panel: No results for input(s): VITAMINB12, FOLATE, FERRITIN, TIBC, IRON, RETICCTPCT in the last 72 hours. Urine analysis:    Component Value Date/Time   COLORURINE YELLOW 10/20/2008 [place] 10/20/2008 0942   LABSPEC 1.011 10/20/2008 0942   PHURINE 6.0 10/20/2008 0942   GLUCOSEU NEGATIVE 10/20/2008 0942   HGBUR LARGE (A) 10/20/2008 0942   BILIRUBINUR NEGATIVE 10/20/2008 0942   KETONESUR NEGATIVE 10/20/2008 0942   PROTEINUR 100 (A) 10/20/2008 0942   UROBILINOGEN 1.0 10/20/2008 0942   NITRITE NEGATIVE 10/20/2008 0942   LEUKOCYTESUR SMALL (A) 10/20/2008 0942   Sepsis Labs: !!!!!!!!!!!!!!!!!!!!!!!!!!!!!!!!!!!!!!!!!!!! @LABRCNTIP (procalcitonin:4,lacticidven:4) ) Recent Results (from the past 240 hour(s))  SARS Coronavirus 2 (CEPHEID - Performed in [facility] hospital lab), Hosp Order     Status: None   Collection Time: 05/25/19  3:03 PM  Result Value Ref Range Status   SARS Coronavirus 2 NEGATIVE NEGATIVE Final    Comment: (NOTE) If result is NEGATIVE SARS-CoV-2 target nucleic acids are NOT DETECTED. The SARS-CoV-2 RNA is generally detectable in upper and lower  respiratory specimens during the acute phase of infection. The lowest  concentration of SARS-CoV-2 viral copies this assay can detect is 250  copies / mL. A negative result does not preclude SARS-CoV-2 infection   and should not be used as the sole basis for treatment or other  patient management decisions.  A negative result may occur with  improper specimen collection / handling, submission of specimen other  than nasopharyngeal swab, presence of viral mutation(s) within the  areas targeted by this assay, and inadequate number of viral copies  (<250 copies / mL). A negative result must be combined with clinical  observations, patient history, and epidemiological information. If result is POSITIVE SARS-CoV-2 target nucleic acids are DETECTED. The SARS-CoV-2 RNA is generally detectable in upper and lower  respiratory specimens dur ing the acute phase of infection.  Positive  results are indicative of active infection with SARS-CoV-2.  Clinical  correlation with patient history and other diagnostic information is  necessary to determine patient infection status.  Positive results do  not rule out bacterial infection or co-infection with other viruses. If result is PRESUMPTIVE POSTIVE SARS-CoV-2 nucleic acids MAY BE PRESENT.   A presumptive positive result was obtained on the submitted specimen  and confirmed on repeat testing.  While 2019 novel coronavirus  (SARS-CoV-2) nucleic acids may be present in the submitted sample  additional confirmatory testing may be necessary for epidemiological  and / or clinical management purposes  to differentiate between  SARS-CoV-2 and other Sarbecovirus currently known to infect humans.  If clinically indicated additional testing with an alternate test  methodology [phone]) is advised. The SARS-CoV-2 RNA is generally  detectable in upper and lower respiratory sp ecimens during the acute  phase of infection. The expected result is Negative. Fact Sheet for Patients:  StrictlyIdeas.no Fact Sheet for Healthcare Providers: BankingDealers.co.za This test is not yet approved or cleared by the [place] FDA and has  been authorized for detection and/or diagnosis of SARS-CoV-2 by FDA under an Emergency Use Authorization (EUA).  This EUA will remain in effect (meaning this test can be used) for the duration of the COVID-19 declaration under Section 564(b)(1) of the Act, 21 U.S.C. section 360bbb-3(b)(1), unless the authorization is terminated or revoked sooner. Performed at [facility], [place] [address], [place], [place] [address]      Radiological Exams on Admission: Dg Chest [facility] 1 View  Result  Date: 05/25/2019 CLINICAL DATA:  Sepsis. EXAM: PORTABLE CHEST 1 VIEW COMPARISON:  Single-view of the chest 02/15/2019 and 10/22/2018. PA and lateral chest 12/27/2018. FINDINGS: Lung volumes are low with some crowding of the bronchovascular structures. Mild vascular congestion is seen. Heart size is upper normal. Aortic atherosclerosis noted. No pneumothorax or pleural effusion. IMPRESSION: Mild pulmonary vascular congestion.  No focal airspace disease. Atherosclerosis. Electronically Signed   By: Inge Rise M.D.   On: 05/25/2019 15:51   Dg Femur Portable Min 2 Views Right  Result Date: 05/25/2019 CLINICAL DATA:  78 year old male with a history of potential osteomyelitis EXAM: RIGHT FEMUR PORTABLE 2 VIEW COMPARISON:  02/15/2019 FINDINGS: Surgical changes of right above knee amputation. There are no erosive changes at the osteotomy site. There has been development of a small bone spur on the medial aspect of the distal cortex, in the region of the previous MRI findings. Triangular shaped focus of heterotopic bone within the soft tissues on the oblique image. No subcutaneous gas or focal soft tissue swelling identified. Vascular calcifications. IMPRESSION: No erosive changes at the right femur osteotomy site that would suggest acute osteomyelitis. Focal bone spur and heterotopic ossification within the soft tissues, potentially postoperative changes versus chronic reactive changes. Electronically Signed   By:  Corrie Mckusick D.O.   On: 05/25/2019 15:54     All images have been reviewed by me personally.    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 (H[facility])   HYPERTENSION, BENIGN   PAF (paroxysmal atrial fibrillation) (HCC)   Hyperlipidemia   ESRD (end stage renal disease) (H[facility])   Pulmonary HTN (H[facility])   Acute encephalopathy   Chronic anemia   GERD (gastroesophageal reflux disease)   HTN (hypertension)   BPH (benign prostatic hyperplasia)   Amputation stump infection ([place])   End stage renal disease on dialysis due to type 2 diabetes mellitus ([place])   Anemia due to end stage renal disease (HCC)    Sepsis; present on admission Possible infected right lower extremity stump - Given leukocytosis confusion and some drainage from right AKA amputation site, will treat this as infection. - No obvious purulent felt.  No acute surgical intervention per vascular surgery -Follow-up culture data, UA-pending.  Chest x-ray is overall negative except mild vascular congestion.  X-ray of the femur-no erosive changes suggestive of osteomyelitis.  There is some chronic reactive changes. - Broad-spectrum antibiotics -Supportive care. -COVID negative, check procalcitonin  Acute metabolic encephalopathy -Patient is awake alert oriented.  Slight confusion in terms of timeline of events.  Suspect secondary to underlying infection.  No focal neuro deficits.  No need for CT of the head at this time.  Transaminitis - Trend LFTs at this time, if continues to trend up will get right upper quadrant ultrasound.  Slight metabolic acidosis - Secondary to underlying infection.  Should improve.  ESRD on hemodialysis -We will need to consult nephrology in the morning for dialysis  Anemia of chronic disease -Hemoglobin at baseline Around 10  History of diabetes mellitus type 2 -Diet controlled.  Previous  hemoglobin A1c 4.9  Paroxysmal atrial fibrillation - Resume his home medications.  Awaiting med rec to be finalized.  Resume home Coumadin level.  Pharmacy to dose.  DVT prophylaxis: On Coumadin Code Status: Patient wishes to be full code Family Communication: None Disposition Plan: To be determined Consults called: Vascular surgery Admission status: Inpatient admission secondary to sepsis  requiring IV antibiotics and evaluation for altered mental status.   Time Spent: 65 minutes.  >50% of the time was devoted to discussing the patients care, assessment, plan and disposition with other care givers along with counseling the patient about the risks and benefits of treatment.    Ankit Arsenio Loader MD [facility]  If 7PM-7AM, please contact night-coverage [email or web address]  05/25/2019, 6:28 PM

## 2019-05-25 NOTE — Progress Notes (Signed)
[name] is a 78 y.o. male patient admitted from ED awake, alert - oriented  X 4 - no acute distress noted.  VSS - Blood pressure 125/60, pulse 87, temperature 98.3 F (36.8 C), temperature source Oral, resp. rate 18, SpO2 94 %.    IV in place, occlusive dsg intact without redness.  Orientation to room, and floor completed with information packet given to patient/family.  Patient declined safety video at this time.  Admission INP armband ID verified with patient/family, and in place.   SR up x 2, fall assessment complete, with patient and family able to verbalize understanding of risk associated with falls, and verbalized understanding to call nsg before up out of bed.  Call light within reach, patient able to voice, and demonstrate understanding.  pt presented with 2 stage two pressure injuries on sacrum, and an area on his right incision from previous aka that is open.    Will cont to eval and treat per MD orders.  Luci Bank, RN 05/25/2019 7:38 PM

## 2019-05-25 NOTE — ED Notes (Signed)
This RN acting as Art therapist and called pt's dtrs home /cell phone. Reached pt's son in law with the home phone number and was able to update him that pt Is in the ED and we are running tests. Pt's dtr would like to be called on cell phone once tests have resulted.

## 2019-05-25 NOTE — Progress Notes (Signed)
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 male who is s/p bilateral above-the-knee amputations in January of 2020 by Dr. Servando Snare. The home health nurse called from [place] [facility] today because there is some drainage from the right above-the-knee amputation.  She would like to have this checked in the office.  The patient dialyzes on Monday Wednesdays and Fridays he will have to be seen on a Tuesday or Thursday.    Dr. Donzetta Matters last evaluated pt in April 2020. At that time the the left stump AKA was well-healed, right side was nearly healed.  Silver nitrate applied to the granulation that day.  Pt was to follow-up on an as-needed basis.  He has ESRD, dialyzes via left upper arm acccess on MWF, pt states no problems in dialysis.   He is oriented to place and person only, he states the year is 2010.   He denies fever or chills, denies dyspnea or chest pain. He states that he feels tired.    Diabetic: Yes Tobacco use: former smoker  Pt meds include: Statin :Yes Betablocker: Yes ASA: No Other anticoagulants/antiplatelets: Coumadin, has atrial fib  Past Medical History:  Diagnosis Date  . A-fib ([place])   . Anemia   . Blood transfusion   . BPH (benign prostatic hyperplasia)   . CHF (congestive heart failure) ([place])   . Diarrhea   . DM (diabetes mellitus) ([place])   . ESRD on hemodialysis ([place])    Started dialysis in 2009  . History of GI bleed    secondary to coumadin  . HTN (hypertension)   . Hyperlipidemia   . OSA (obstructive sleep apnea)    uses CPAP  . Secondary hyperparathyroidism of renal origin [facility])     Social History Social History   Tobacco Use  . Smoking status: Former Smoker    Types: Cigarettes    Last attempt to quit: 06/30/2004    Years since quitting: 14.9  . Smokeless tobacco: Never Used  Substance Use Topics  . Alcohol use: No  . Drug use: No     Family History Family History  Problem Relation Age of Onset  . Alzheimer's disease Mother   . Diabetes Father        Amputation:  bilateral legs  . Cancer Daughter        breast cancer  . Diabetes Son   . Heart disease Son        before age 41  . Hypertension Son   . Anesthesia problems Neg Hx     Past Surgical History:  Procedure Laterality Date  . ABDOMINAL AORTOGRAM N/A 11/15/2018   Procedure: ABDOMINAL AORTOGRAM;  Surgeon: [name], MD;  Location: [place] [facility];  Service: Cardiovascular;  Laterality: N/A;  . AMPUTATION Left 12/06/2018   Procedure: AMPUTATION DIGIT LEFT FIFTH TOE;  Surgeon: Angelia Mould, MD;  Location: [place];  Service: Vascular;  Laterality: Left;  . AMPUTATION Bilateral 12/29/2018   Procedure: AMPUTATION ABOVE KNEE;  Surgeon: Waynetta Sandy, MD;  Location: [place];  Service: Vascular;  Laterality: Bilateral;  . APPLICATION OF WOUND VAC Right 02/17/2019   Procedure: Application Of Wound Vac;  Surgeon: Marty Heck, MD;  Location: [place];  Service: Vascular;  Laterality: Right;  . BVT  05/24/40   Left  Basilic Vein Transposition  . CHOLECYSTECTOMY    . EYE SURGERY  Catarct bil  . I&D EXTREMITY Right 02/17/2019   Procedure: Right above the kneee debridement;  Surgeon: Marty Heck, MD;  Location: [place];  Service: Vascular;  Laterality: Right;  . INSERTION OF DIALYSIS CATHETER  05/28/2012   Procedure: INSERTION OF DIALYSIS CATHETER;  Surgeon: Mal Misty, MD;  Location: [place];  Service: Vascular;  Laterality: Right;  . Left arm shuntogram.    . Left forearm loop graft with 6 mm Gore-Tex graft.    . LOWER EXTREMITY ANGIOGRAPHY Bilateral 11/15/2018   Procedure: LOWER EXTREMITY ANGIOGRAPHY;  Surgeon: [name], MD;  Location: [place] [facility];  Service: Cardiovascular;  Laterality: Bilateral;  . Pars plana vitrectomy with 25-gauge system    . PERIPHERAL VASCULAR ATHERECTOMY Left 11/15/2018   Procedure:  PERIPHERAL VASCULAR ATHERECTOMY;  Surgeon: [name], MD;  Location: [place] [facility];  Service: Cardiovascular;  Laterality: Left;  SFA with STENT  . PERIPHERAL VASCULAR BALLOON ANGIOPLASTY Left 11/15/2018   Procedure: PERIPHERAL VASCULAR BALLOON ANGIOPLASTY;  Surgeon: [name], MD;  Location: [place] [facility];  Service: Cardiovascular;  Laterality: Left;  PT    Allergies  Allergen Reactions  . Occlusive Silicone Sheets [Silicone] Other (See Comments)    Unknown reaction (listed as 'occlusive adhesive' on [facility] 02/15/2019)  . Other Other (See Comments)    Unknown reaction to Occlusive adhesive  . Tape Itching    Use Cloth tape only    Current Outpatient Medications  Medication Sig Dispense Refill  . acetaminophen (TYLENOL) 500 MG tablet Take 1,000 mg by mouth 3 (three) times daily.     . Amino Acids-Protein Hydrolys (FEEDING SUPPLEMENT, PRO-STAT SUGAR FREE 64,) LIQD Take 30 mLs by mouth 2 (two) times daily. 9a and 5p    . atorvastatin (LIPITOR) 20 MG tablet Take 20 mg by mouth daily.    Marland Kitchen atorvastatin (LIPITOR) 20 MG tablet Take 20 mg by mouth daily.    . B Complex-C-Folic Acid (RENAL VITAMIN PO) Take 1 tablet by mouth at bedtime.     . calcium acetate (PHOSLO) 667 MG capsule Take 1,334 mg by mouth 3 (three) times daily with meals.    . dicyclomine (BENTYL) 20 MG tablet Take 20 mg by mouth 3 (three) times daily before meals.    Marland Kitchen doxercalciferol (HECTOROL) 4 MCG/2ML injection Inject 1 mL (2 mcg total) into the vein every Monday, Wednesday, and Friday with hemodialysis. 2 mL   . ferrous gluconate (FERGON) 240 (27 FE) MG tablet Take 1 tablet (240 mg total) by mouth 3 (three) times daily with meals. 90 each 0  . furosemide (LASIX) 40 MG tablet Take 40 mg by mouth daily.    Marland Kitchen loperamide (IMODIUM A-D) 2 MG tablet Take 4 mg by mouth See admin instructions. Take 2 tablets ( 4 mg) by mouth  after 1st loose stool as needed for diarrhea    . metoprolol tartrate (LOPRESSOR) 25 MG  tablet Take 25 mg by mouth See admin instructions. Take one tablet (25 mg) by mouth twice daily on Tuesday, Thursday, Saturday and Sunday (non-dialysis days); take one tablet (25 mg) by mouth on Monday, Wednesday, Friday nights (dialysis days)    . midodrine (PROAMATINE) 10 MG tablet Take 10 mg by mouth See admin instructions. Take one tablet (10 mg) by mouth on Monday, Wednesday, Friday 30 minutes prior to dialysis.    . Multiple Vitamin (MULTIVITAMIN WITH MINERALS) TABS tablet Take 1 tablet by mouth daily.    . Nutritional Supplements (  ADULT NUTRITIONAL SUPPLEMENT PO) Take by mouth. Magic cup with dinner    . pantoprazole (PROTONIX) 40 MG tablet Take 1 tablet (40 mg total) by mouth daily.  0  . warfarin (COUMADIN) 2.5 MG tablet Take 2.5 mg by mouth daily.     No current facility-administered medications for this visit.     ROS: See HPI for pertinent positives and negatives.   Physical Examination  Vitals:   05/25/19 1221  BP: (!) 84/51  Pulse: 61  Resp: 20  Temp: (!) 100.4 F (38 C)  SpO2: 99%  Weight: 122 lb (55.3 kg)  Height: 5\' 6"  (1.676 m)   Body mass index is 19.69 kg/m.  General: A&O x 3, WDWN, small male. Gait: seated in his w/c HENT: No gross abnormalities.  Eyes: pinpoint bilaterally Pulmonary: Respirations are non labored, CTAB, good air movement in all fields Cardiac: irregular rhythm, + murmur.         Carotid Bruits Right Left   negative Positive   Radial pulses are 1+ right, not palpable left.     Adominal aortic pulse is not palpable                         VASCULAR EXAM: Extremities without ischemic changes, without Gangrene; with small open wound at medial aspect right AKA incision, draining scant pink drainage when expressed, some fluctuance surrounding wound. See photo below.    Right AKA, scant pink drainage at medial aspect of well healed incision    Left AKA, well healed                                                                                                            LE Pulses Right Left       FEMORAL  2+ palpable  2+ palpable        POPLITEAL  AKA   AKA       POSTERIOR TIBIAL  AKA   AKA        DORSALIS PEDIS      ANTERIOR TIBIAL AKA  AKA    Abdomen: soft, non tender, no palpable masses. Skin: no rashes, see Extremities Musculoskeletal: Bilateral AKA Neurologic: A&O X 2;  Sensation is normal; MOTOR FUNCTION:  moving all extremities equally, motor strength 3/5 throughout. Speech is fluent/normal. CN 2-12  grossly intact. Psychiatric: Somnolent, but arousable.      ASSESSMENT: ARLESS VINEYARD is a 78 y.o. male who is s/p bilateral above-the-knee amputations in January of 2020 by Dr. Servando Snare. The pt is febrile and hypotensive, more hypotensive than previous visit blood pressures recorded. He does have ESRD on is on hemodialysis.   Pt has small open wound at medial aspect right AKA well healed incision, some fluctuance around this. He denies feeling fever or chills. He is somnolent but arrousable.   I discussed with Dr. Donzetta Matters, will send pt to the ED for evaluation, concern of for sepsis. His lungs are clear, his abdomen is non tender.   I spoke with triage  nurse at [facility] ED, gave her report re reason for evaluation at the ED.  Pt left via ambulance transport to [facility] ED.     PLAN:  Based on the patient's vascular studies and examination, pt will be sent to the ED for evaluation of fever and hypotension.    Clemon Chambers, RN, MSN, FNP-C [facility] [employer] Phone: [phone]  Clinic MD: Donzetta Matters on call  05/25/19 12:38 PM

## 2019-05-25 NOTE — ED Notes (Signed)
Pt oxygen dropped to 88% on room air, 2L [place] applied, saturations increased to 96%

## 2019-05-25 NOTE — Consult Note (Signed)
Hospital Consult    Reason for Consult: Leukocytosis and confusion previous bilateral above-knee amputations and end-stage renal disease Referring Physician: Dr. Stark Jock MRN #:  [id number]  History of Present Illness: This is a 78 y.o. male with history of bilateral above-knee amputations.  Also has end-stage renal disease on dialysis via left upper arm AV fistula.  Presented to the office today with concern for drainage from his wound was noted to be febrile and confused.  Was not complaining of other complaints at the time.  States he does have some pain in the right above-knee amputation site residual limb.  He does appear confused on exam making threats.  Past Medical History:  Diagnosis Date  . A-fib ([place])   . Anemia   . Blood transfusion   . BPH (benign prostatic hyperplasia)   . CHF (congestive heart failure) ([place])   . Diarrhea   . DM (diabetes mellitus) ([place])   . ESRD on hemodialysis ([place])    Started dialysis in 2009  . History of GI bleed    secondary to coumadin  . HTN (hypertension)   . Hyperlipidemia   . OSA (obstructive sleep apnea)    uses CPAP  . Secondary hyperparathyroidism of renal origin [facility])     Past Surgical History:  Procedure Laterality Date  . ABDOMINAL AORTOGRAM N/A 11/15/2018   Procedure: ABDOMINAL AORTOGRAM;  Surgeon: [name], MD;  Location: [place] CV [facility];  Service: Cardiovascular;  Laterality: N/A;  . AMPUTATION Left 12/06/2018   Procedure: AMPUTATION DIGIT LEFT FIFTH TOE;  Surgeon: Angelia Mould, MD;  Location: [place];  Service: Vascular;  Laterality: Left;  . AMPUTATION Bilateral 12/29/2018   Procedure: AMPUTATION ABOVE KNEE;  Surgeon: Waynetta Sandy, MD;  Location: [place];  Service: Vascular;  Laterality: Bilateral;  . APPLICATION OF WOUND VAC Right 02/17/2019   Procedure: Application Of Wound Vac;  Surgeon: Marty Heck, MD;  Location: [place];  Service: Vascular;  Laterality: Right;  . BVT  8/78/67   Left   Basilic Vein Transposition  . CHOLECYSTECTOMY    . EYE SURGERY     Catarct bil  . I&D EXTREMITY Right 02/17/2019   Procedure: Right above the kneee debridement;  Surgeon: Marty Heck, MD;  Location: [place];  Service: Vascular;  Laterality: Right;  . INSERTION OF DIALYSIS CATHETER  05/28/2012   Procedure: INSERTION OF DIALYSIS CATHETER;  Surgeon: Mal Misty, MD;  Location: [place];  Service: Vascular;  Laterality: Right;  . Left arm shuntogram.    . Left forearm loop graft with 6 mm Gore-Tex graft.    . LOWER EXTREMITY ANGIOGRAPHY Bilateral 11/15/2018   Procedure: LOWER EXTREMITY ANGIOGRAPHY;  Surgeon: [name], MD;  Location: [place] CV [facility];  Service: Cardiovascular;  Laterality: Bilateral;  . Pars plana vitrectomy with 25-gauge system    . PERIPHERAL VASCULAR ATHERECTOMY Left 11/15/2018   Procedure: PERIPHERAL VASCULAR ATHERECTOMY;  Surgeon: [name], MD;  Location: [place] CV [facility];  Service: Cardiovascular;  Laterality: Left;  SFA with STENT  . PERIPHERAL VASCULAR BALLOON ANGIOPLASTY Left 11/15/2018   Procedure: PERIPHERAL VASCULAR BALLOON ANGIOPLASTY;  Surgeon: [name], MD;  Location: [place] CV [facility];  Service: Cardiovascular;  Laterality: Left;  PT    Allergies  Allergen Reactions  . Occlusive Silicone Sheets [Silicone] Other (See Comments)    Unknown reaction (listed as 'occlusive adhesive' on [facility] 02/15/2019)  . Other Other (See Comments)    Unknown reaction to Occlusive  adhesive  . Tape Itching    Use Cloth tape only    Prior to Admission medications   Medication Sig Start Date End Date Taking? Authorizing Provider  acetaminophen (TYLENOL) 500 MG tablet Take 1,000 mg by mouth 3 (three) times daily.     [provider]  Amino Acids-Protein Hydrolys (FEEDING SUPPLEMENT, PRO-STAT SUGAR FREE 64,) LIQD Take 30 mLs by mouth 2 (two) times daily. 9a and 5p    [provider]  atorvastatin (LIPITOR) 20 MG tablet Take 20 mg by  mouth daily.    [provider]  atorvastatin (LIPITOR) 20 MG tablet Take 20 mg by mouth daily.    [provider]  B Complex-C-Folic Acid (RENAL VITAMIN PO) Take 1 tablet by mouth at bedtime.     [provider]  calcium acetate (PHOSLO) 667 MG capsule Take 1,334 mg by mouth 3 (three) times daily with meals.    [provider]  dicyclomine (BENTYL) 20 MG tablet Take 20 mg by mouth 3 (three) times daily before meals.    [provider]  doxercalciferol (HECTOROL) 4 MCG/2ML injection Inject 1 mL (2 mcg total) into the vein every Monday, Wednesday, and Friday with hemodialysis. 12/05/18   Nita Sells, MD  ferrous gluconate (FERGON) 240 (27 FE) MG tablet Take 1 tablet (240 mg total) by mouth 3 (three) times daily with meals. 12/04/18   Nita Sells, MD  furosemide (LASIX) 40 MG tablet Take 40 mg by mouth daily.    [provider]  loperamide (IMODIUM A-D) 2 MG tablet Take 4 mg by mouth See admin instructions. Take 2 tablets ( 4 mg) by mouth  after 1st loose stool as needed for diarrhea    [provider]  metoprolol tartrate (LOPRESSOR) 25 MG tablet Take 25 mg by mouth See admin instructions. Take one tablet (25 mg) by mouth twice daily on Tuesday, Thursday, Saturday and Sunday (non-dialysis days); take one tablet (25 mg) by mouth on Monday, Wednesday, Friday nights (dialysis days)    [provider]  midodrine (PROAMATINE) 10 MG tablet Take 10 mg by mouth See admin instructions. Take one tablet (10 mg) by mouth on Monday, Wednesday, Friday 30 minutes prior to dialysis.    [provider]  Multiple Vitamin (MULTIVITAMIN WITH MINERALS) TABS tablet Take 1 tablet by mouth daily.    [provider]  Nutritional Supplements (ADULT NUTRITIONAL SUPPLEMENT PO) Take by mouth. Magic cup with dinner    [provider]  pantoprazole (PROTONIX) 40 MG tablet Take 1 tablet (40 mg total) by mouth daily. 01/01/19    Domenic Polite, MD  warfarin (COUMADIN) 2.5 MG tablet Take 2.5 mg by mouth daily.    [provider]    Social History   Socioeconomic History  . Marital status: Divorced    Spouse name: Not on file  . Number of children: Not on file  . Years of education: Not on file  . Highest education level: Not on file  Occupational History  . Not on file  Social Needs  . Financial resource strain: Not on file  . Food insecurity:    Worry: Not on file    Inability: Not on file  . Transportation needs:    Medical: Not on file    Non-medical: Not on file  Tobacco Use  . Smoking status: Former Smoker    Types: Cigarettes    Last attempt to quit: 06/30/2004    Years since quitting: 14.9  . Smokeless  tobacco: Never Used  Substance and Sexual Activity  . Alcohol use: No  . Drug use: No  . Sexual activity: Never  Lifestyle  . Physical activity:    Days per week: Not on file    Minutes per session: Not on file  . Stress: Not on file  Relationships  . Social connections:    Talks on phone: Not on file    Gets together: Not on file    Attends religious service: Not on file    Active member of club or organization: Not on file    Attends meetings of clubs or organizations: Not on file    Relationship status: Not on file  . Intimate partner violence:    Fear of current or ex partner: Not on file    Emotionally abused: Not on file    Physically abused: Not on file    Forced sexual activity: Not on file  Other Topics Concern  . Not on file  Social History Narrative     The patient is a former smoker, quit is 2005.  Does not     drink or abuse drugs.           Family History  Problem Relation Age of Onset  . Alzheimer's disease Mother   . Diabetes Father        Amputation:  bilateral legs  . Cancer Daughter        breast cancer  . Diabetes Son   . Heart disease Son        before age 14  . Hypertension Son   . Anesthesia problems Neg Hx     ROS: History obtained  from chart as patient is currently confused  Physical Examination  Vitals:   05/25/19 1422 05/25/19 1502  BP: (!) 97/50   Pulse: 92   Resp: (!) 22   Temp: 98.8 F (37.1 C) 100.3 F (37.9 C)  SpO2: 99%    There is no height or weight on file to calculate BMI.  General: Patient is currently confused, patient smells of urine HENT: WNL, normocephalic Pulmonary: normal non-labored breathing Cardiac: Palpable radial pulses bilaterally Abdomen:  soft, NT/ND, no masses Extremities: Left above-knee amputation site is healed Right above-knee amputation site medial site has some granulation tissue I cannot express any purulence Neurologic: Patient is combative is oriented to person and place  CBC    Component Value Date/Time   WBC 17.5 (H) 05/25/2019 1421   RBC 3.94 (L) 05/25/2019 1421   HGB 10.7 (L) 05/25/2019 1421   HCT 35.1 (L) 05/25/2019 1421   HCT 27.1 (L) 11/09/2018 0230   PLT 251 05/25/2019 1421   MCV 89.1 05/25/2019 1421   MCH 27.2 05/25/2019 1421   MCHC 30.5 05/25/2019 1421   RDW 20.3 (H) 05/25/2019 1421   LYMPHSABS 1.2 05/25/2019 1421   MONOABS 1.9 (H) 05/25/2019 1421   EOSABS 0.1 05/25/2019 1421   BASOSABS 0.1 05/25/2019 1421    BMET    Component Value Date/Time   NA 138 05/25/2019 1421   NA 141 02/27/2019   K 4.8 05/25/2019 1421   CL 95 (L) 05/25/2019 1421   CO2 26 05/25/2019 1421   GLUCOSE 130 (H) 05/25/2019 1421   BUN 50 (H) 05/25/2019 1421   BUN 64 (A) 02/27/2019   CREATININE 4.76 (H) 05/25/2019 1421   CALCIUM 10.4 (H) 05/25/2019 1421   CALCIUM 8.3 (L) 10/25/2008 1450   GFRNONAA 11 (L) 05/25/2019 1421   GFRAA 13 (L) 05/25/2019 1421  COAGS: Lab Results  Component Value Date   INR 1.8 (H) 02/21/2019   INR 1.9 02/20/2019   INR 1.86 02/19/2019     ASSESSMENT/PLAN: This is a 78 y.o. male presents with confusion leukocytosis with drainage from his right above-knee amputation site wound.  I cannot express any purulence from the wound today at  bedside does appear to be simple granulation tissue.  Patient undergoing work-up for infectious cause but I think at this time unlikely above-knee amputation site and also the fistula has left upper arm appears healthy and not infected.  He is certainly not at his usual baseline mental status as he is typically quite pleasant today is angry and making threats certainly concerning for underlying metabolic or infectious process.  Raphael Fitzpatrick C. Donzetta Matters, MD [facility] [place] Office: [phone] Pager: [phone]

## 2019-05-25 NOTE — Progress Notes (Signed)
Pharmacy Antibiotic Note  [name] is a 78 y.o. male admitted on 05/25/2019 with wound infection.  Pharmacy has been consulted for vancomycin dosing. Tmax 100.4. Patient has ESRD, on dialysis MWF.   Plan: Vancomycin 1250 mg IVx1, and 500 mg IV qdialysis schedule Cefepime 2gIV x1, and 2g IV qdialysis schedule Monitor clinical status, dialysis schedule, cultures, and length of therapy   Temp (24hrs), Avg:99.8 F (37.7 C), Min:98.8 F (37.1 C), Max:100.4 F (38 C)  Recent Labs  Lab 05/25/19 1421  WBC 17.5*  CREATININE 4.76*    Estimated Creatinine Clearance: 10 mL/min (A) (by C-G formula based on SCr of 4.76 mg/dL (H)).    Allergies  Allergen Reactions  . Occlusive Silicone Sheets [Silicone] Other (See Comments)    Unknown reaction (listed as 'occlusive adhesive' on [facility] 02/15/2019)  . Other Other (See Comments)    Unknown reaction to Occlusive adhesive  . Tape Itching    Use Cloth tape only    Antimicrobials this admission: Vancomycin 5/28 >> Cefepime 5/28 >>  Dose adjustments this admission:   Microbiology results: 5/28 BCx: 5/28 COVID:  Thank you for allowing pharmacy to be a part of this patient's care.  Claiborne Billings, PharmD PGY2 Cardiology Pharmacy Resident Please check AMION for all Pharmacist numbers by unit 05/25/2019 3:40 PM

## 2019-05-25 NOTE — ED Notes (Signed)
Lactic Acid 2.2 called to this RN by lab.

## 2019-05-25 NOTE — ED Provider Notes (Addendum)
[facility] DEPARTMENT Provider Note   CSN: [id number] Arrival date & time: 05/25/19  1414    History   Chief Complaint Chief Complaint  Patient presents with  . Hypotension    HPI [name] is a 78 y.o. male.     The history is provided by medical records. No language interpreter was used.     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  Patient is altered, history is limited therefore level 5 caveats applies.  Patient sent here from vascular office with concerns of infection of his right AKA.  He was found to be febrile and hypotensive.  Past Medical History:  Diagnosis Date  . A-fib ([place])   . Anemia   . Blood transfusion   . BPH (benign prostatic hyperplasia)   . CHF (congestive heart failure) ([place])   . Diarrhea   . DM (diabetes mellitus) ([place])   . ESRD on hemodialysis ([place])    Started dialysis in 2009  . History of GI bleed    secondary to coumadin  . HTN (hypertension)   . Hyperlipidemia   . OSA (obstructive sleep apnea)    uses CPAP  . Secondary hyperparathyroidism of renal origin [facility])     Patient Active Problem List   Diagnosis Date Noted  . Hypertensive heart and kidney disease with chronic diastolic congestive heart failure and stage 5 chronic kidney disease on chronic dialysis ([place]) 03/24/2019  . End stage renal disease on dialysis due to type 2 diabetes mellitus ([place]) 03/24/2019  . 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 ([place]) 03/24/2019  . Anemia due to end stage renal disease ([place]) 03/24/2019  . Protein-calorie malnutrition, severe ([place]) 03/24/2019  . Hyperlipidemia associated with type 2 diabetes mellitus ([place]) 02/22/2019  . Amputation stump infection ([place]) 02/15/2019  . Conjunctivitis of left eye 01/04/2019  .  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[place]) 12/30/2018  . Open wound of left foot   . Anticoagulation management encounter   . OSA (obstructive sleep apnea)   . HTN (hypertension)   . ESRD on hemodialysis ([place])   . BPH (benign prostatic hyperplasia)   . Hypotension 12/07/2018  . GERD (gastroesophageal reflux disease) 12/07/2018  . Pressure injury of skin 12/03/2018  . Symptomatic anemia 12/02/2018  . Acute on chronic blood loss anemia 12/02/2018  . 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[place]) 12/02/2018  . Lower GI bleeding 12/02/2018  . Heme positive stool   . Acute on chronic diastolic (congestive) heart failure ([place]) 11/19/2018  . Acute CVA (cerebrovascular accident) ([place]) 11/19/2018  . Hypoglycemia 11/19/2018  . Peripheral vascular disease due to secondary diabetes ([place]) 11/19/2018  . Dry gangrene ([place]) 11/19/2018  . 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 DNR (do not resuscitate) discussion   . Goals of care, counseling/discussion   . Palliative care by specialist   . Acute hypoxemic respiratory failure ([place]) 11/06/2018  . Volume overload 11/06/2018  . Multifocal pneumonia 11/06/2018  . Acute metabolic encephalopathy 26/83/4196  . Physical deconditioning 11/06/2018  . Acute respiratory failure with hypoxia and hypercapnia ([place]) 11/04/2018  . CAP (community acquired pneumonia) 11/04/2018  . Pleural effusion, right 11/04/2018  . Increased ammonia level 11/04/2018  . Abnormal CT of the abdomen 11/04/2018  . Atrial fibrillation with RVR ([place]) 11/04/2018  . Sepsis ([place]) 10/21/2018  .  Acute encephalopathy 10/21/2018  . Elevated alkaline phosphatase level 10/21/2018  . Chronic anemia 10/21/2018  . Thrombocytopenia ([place]) 10/21/2018  . Pulmonary HTN ([place]) 03/04/2017  . Aortic valve stenosis 03/04/2017  . Hypertension, accelerated with heart disease, without CHF 01/12/2017  . Dyspnea on exertion 01/12/2017  . ESRD (end stage renal disease) ([place]) 06/07/2012  .  Hyperlipidemia 04/24/2011  . HYPERTENSION, BENIGN 10/24/2009  . Type 2 diabetes mellitus with hypertension and end stage renal disease on dialysis ([place]) 10/23/2009  . OBSTRUCTIVE SLEEP APNEA 10/23/2009  . PAF (paroxysmal atrial fibrillation) ([place]) 10/23/2009  . CHF (congestive heart failure) ([place]) 10/23/2009    Past Surgical History:  Procedure Laterality Date  . ABDOMINAL AORTOGRAM N/A 11/15/2018   Procedure: ABDOMINAL AORTOGRAM;  Surgeon: [name], MD;  Location: [facility];  Service: Cardiovascular;  Laterality: N/A;  . AMPUTATION Left 12/06/2018   Procedure: AMPUTATION DIGIT LEFT FIFTH TOE;  Surgeon: Angelia Mould, MD;  Location: [facility];  Service: Vascular;  Laterality: Left;  . AMPUTATION Bilateral 12/29/2018   Procedure: AMPUTATION ABOVE KNEE;  Surgeon: Waynetta Sandy, MD;  Location: [facility];  Service: Vascular;  Laterality: Bilateral;  . APPLICATION OF WOUND VAC Right 02/17/2019   Procedure: Application Of Wound Vac;  Surgeon: Marty Heck, MD;  Location: [facility];  Service: Vascular;  Laterality: Right;  . BVT  08/06/16   Left  Basilic Vein Transposition  . CHOLECYSTECTOMY    . EYE SURGERY     Catarct bil  . I&D EXTREMITY Right 02/17/2019   Procedure: Right above the kneee debridement;  Surgeon: Marty Heck, MD;  Location: [facility];  Service: Vascular;  Laterality: Right;  . INSERTION OF DIALYSIS CATHETER  05/28/2012   Procedure: INSERTION OF DIALYSIS CATHETER;  Surgeon: Mal Misty, MD;  Location: [facility];  Service: Vascular;  Laterality: Right;  . Left arm shuntogram.    . Left forearm loop graft with 6 mm Gore-Tex graft.    . LOWER EXTREMITY ANGIOGRAPHY Bilateral 11/15/2018   Procedure: LOWER EXTREMITY ANGIOGRAPHY;  Surgeon: [name], MD;  Location: [facility];  Service: Cardiovascular;  Laterality: Bilateral;  . Pars plana vitrectomy with 25-gauge system    . PERIPHERAL VASCULAR ATHERECTOMY Left 11/15/2018   Procedure:  PERIPHERAL VASCULAR ATHERECTOMY;  Surgeon: [name], MD;  Location: [facility];  Service: Cardiovascular;  Laterality: Left;  SFA with STENT  . PERIPHERAL VASCULAR BALLOON ANGIOPLASTY Left 11/15/2018   Procedure: PERIPHERAL VASCULAR BALLOON ANGIOPLASTY;  Surgeon: [name], MD;  Location: [facility];  Service: Cardiovascular;  Laterality: Left;  PT        Home Medications    Prior to Admission medications   Medication Sig Start Date End Date Taking? Authorizing Provider  acetaminophen (TYLENOL) 500 MG tablet Take 1,000 mg by mouth 3 (three) times daily.     [provider]  Amino Acids-Protein Hydrolys (FEEDING SUPPLEMENT, PRO-STAT SUGAR FREE 64,) LIQD Take 30 mLs by mouth 2 (two) times daily. 9a and 5p    [provider]  atorvastatin (LIPITOR) 20 MG tablet Take 20 mg by mouth daily.    [provider]  atorvastatin (LIPITOR) 20 MG tablet Take 20 mg by mouth daily.    [provider]  B Complex-C-Folic Acid (RENAL VITAMIN PO) Take 1 tablet by mouth at bedtime.     [provider]  calcium acetate (PHOSLO) 667 MG capsule Take 1,334 mg by mouth 3 (  three) times daily with meals.    [provider]  dicyclomine (BENTYL) 20 MG tablet Take 20 mg by mouth 3 (three) times daily before meals.    [provider]  doxercalciferol (HECTOROL) 4 MCG/2ML injection Inject 1 mL (2 mcg total) into the vein every Monday, Wednesday, and Friday with hemodialysis. 12/05/18   Nita Sells, MD  ferrous gluconate (FERGON) 240 (27 FE) MG tablet Take 1 tablet (240 mg total) by mouth 3 (three) times daily with meals. 12/04/18   Nita Sells, MD  furosemide (LASIX) 40 MG tablet Take 40 mg by mouth daily.    [provider]  loperamide (IMODIUM A-D) 2 MG tablet Take 4 mg by mouth See admin instructions. Take 2 tablets ( 4 mg) by mouth  after 1st loose stool as needed for diarrhea    [provider]   metoprolol tartrate (LOPRESSOR) 25 MG tablet Take 25 mg by mouth See admin instructions. Take one tablet (25 mg) by mouth twice daily on Tuesday, Thursday, Saturday and Sunday (non-dialysis days); take one tablet (25 mg) by mouth on Monday, Wednesday, Friday nights (dialysis days)    [provider]  midodrine (PROAMATINE) 10 MG tablet Take 10 mg by mouth See admin instructions. Take one tablet (10 mg) by mouth on Monday, Wednesday, Friday 30 minutes prior to dialysis.    [provider]  Multiple Vitamin (MULTIVITAMIN WITH MINERALS) TABS tablet Take 1 tablet by mouth daily.    [provider]  Nutritional Supplements (ADULT NUTRITIONAL SUPPLEMENT PO) Take by mouth. Magic cup with dinner    [provider]  pantoprazole (PROTONIX) 40 MG tablet Take 1 tablet (40 mg total) by mouth daily. 01/01/19   Domenic Polite, MD  warfarin (COUMADIN) 2.5 MG tablet Take 2.5 mg by mouth daily.    [provider]    Family History Family History  Problem Relation Age of Onset  . Alzheimer's disease Mother   . Diabetes Father        Amputation:  bilateral legs  . Cancer Daughter        breast cancer  . Diabetes Son   . Heart disease Son        before age 37  . Hypertension Son   . Anesthesia problems Neg Hx     Social History Social History   Tobacco Use  . Smoking status: Former Smoker    Types: Cigarettes    Last attempt to quit: 06/30/2004    Years since quitting: 14.9  . Smokeless tobacco: Never Used  Substance Use Topics  . Alcohol use: No  . Drug use: No     Allergies   Occlusive silicone sheets [silicone]; Other; and Tape   Review of Systems Review of Systems  Unable to perform ROS: Mental status change     Physical Exam Updated Vital Signs BP (!) 97/50 (BP Location: Right Arm)   Pulse 92   Temp 98.8 F (37.1 C) (Oral)   Resp (!) 22   SpO2 99%   Physical Exam Vitals signs and nursing note reviewed.  Constitutional:       Appearance: He is well-developed.     Comments: Chronically ill appearing male, delirious  HENT:     Head: Atraumatic.  Eyes:     Conjunctiva/sclera: Conjunctivae normal.  Neck:     Musculoskeletal: Neck supple. No neck rigidity.     Comments: No nuchal rigidity Cardiovascular:     Rate and Rhythm: Tachycardia present.  Pulmonary:  Effort: Pulmonary effort is normal.     Breath sounds: Normal breath sounds. No wheezing.  Abdominal:     Palpations: Abdomen is soft.  Musculoskeletal:     Comments:  bilateral AKA.  There is a 1 cm ulcerated lesion noted to the distal right AKA without significant surrounding erythema.  There is mild serous fluid discharge.  The area is warm to the touch.  Skin:    Findings: No rash.     Comments: L AV fistula without evidence of infection.    Neurological:     Mental Status: He is alert. He is disoriented.      ED Treatments / Results  Labs (all labs ordered are listed, but only abnormal results are displayed) Labs Reviewed  COMPREHENSIVE METABOLIC PANEL - Abnormal; Notable for the following components:      Result Value   Chloride 95 (*)    Glucose, Bld 130 (*)    BUN 50 (*)    Creatinine, Ser 4.76 (*)    Calcium 10.4 (*)    Total Protein 8.2 (*)    Albumin 2.9 (*)    AST 77 (*)    ALT 58 (*)    Alkaline Phosphatase 288 (*)    GFR calc non Af Amer 11 (*)    GFR calc Af Amer 13 (*)    Anion gap 17 (*)    All other components within normal limits  CBC WITH DIFFERENTIAL/PLATELET - Abnormal; Notable for the following components:   WBC 17.5 (*)    RBC 3.94 (*)    Hemoglobin 10.7 (*)    HCT 35.1 (*)    RDW 20.3 (*)    Neutro Abs 14.2 (*)    Monocytes Absolute 1.9 (*)    Abs Immature Granulocytes 0.13 (*)    All other components within normal limits  CULTURE, BLOOD (ROUTINE X 2)  CULTURE, BLOOD (ROUTINE X 2)  SARS CORONAVIRUS 2 (HOSPITAL ORDER, [facility])  LACTIC ACID, PLASMA  LACTIC ACID, PLASMA   URINALYSIS, ROUTINE W REFLEX MICROSCOPIC    EKG EKG Interpretation  Date/Time:  Thursday May 25 2019 14:14:51 EDT Ventricular Rate:  93 PR Interval:    QRS Duration: 82 QT Interval:  368 QTC Calculation: 458 R Axis:   32 Text Interpretation:  Atrial Flutter  Anterior infarct, old Confirmed by Veryl Speak [phone]) on 05/25/2019 2:21:50 PM   Radiology No results found.  Procedures .Critical Care Performed by: Domenic Moras, PA-C Authorized by: Domenic Moras, PA-C   Critical care provider statement:    Critical care time (minutes):  45   Critical care was time spent personally by me on the following activities:  Discussions with consultants, evaluation of patient's response to treatment, examination of patient, ordering and performing treatments and interventions, ordering and review of laboratory studies, ordering and review of radiographic studies, pulse oximetry, re-evaluation of patient's condition, obtaining history from patient or surrogate and review of old charts   (including critical care time)  Medications Ordered in ED Medications  sodium chloride 0.9 % bolus 1,000 mL (1,000 mLs Intravenous New Bag/Given 05/25/19 1447)    And  sodium chloride 0.9 % bolus 500 mL (has no administration in time range)    And  sodium chloride 0.9 % bolus 250 mL (has no administration in time range)  vancomycin (VANCOCIN) 1,250 mg in sodium chloride 0.9 % 250 mL IVPB (has no administration in time range)  vancomycin variable dose per unstable renal function (pharmacist  dosing) (has no administration in time range)  acetaminophen (TYLENOL) suppository 650 mg (650 mg Rectal Given 05/25/19 1501)  ceFEPIme (MAXIPIME) 2 g in sodium chloride 0.9 % 100 mL IVPB (2 g Intravenous New Bag/Given 05/25/19 1458)     Initial Impression / Assessment and Plan / ED Course  I have reviewed the triage vital signs and the nursing notes.  Pertinent labs & imaging results that were available during my care of  the patient were reviewed by me and considered in my medical decision making (see chart for details).        BP (!) 97/50 (BP Location: Right Arm)   Pulse 92   Temp 100.3 F (37.9 C) (Rectal)   Resp (!) 22   SpO2 99%    Final Clinical Impressions(s) / ED Diagnoses   Final diagnoses:  None    ED Discharge Orders    None     2:38 PM Patient with multiple comorbidities sent here from vascular surgeon office with concerns for skin infection.  He has a wound to his right AKA with serous discharge.  He was found to be febrile and hypotensive.  Code sepsis initiated.  History is limited as patient is exhibiting delirium.  3:38 PM Suspect infectious source coming from R AKA.  He had amputation in Jan of this year.  Labs remarkable for leukocytosis of 17. Lactic acid 2.8.  K+ 4.8 and normal.  Xray of Chest and R femur pending.  Pt receiving fluid resuscitation at 73ml/kg.  He is a dialysis pt and potentially can be fluid overload, but in the setting of sepsis we will continue with fluid hydration.  Broad spectrum abx given.    Pt sign out to oncoming team who will f/u on xray result, and consult appropriate team for admission. Sepsis reassessment done. COVID-19 test ordered  [name] was evaluated in Emergency Department on 05/25/2019 for the symptoms described in the history of present illness. He was evaluated in the context of the global COVID-19 pandemic, which necessitated consideration that the patient might be at risk for infection with the SARS-CoV-2 virus that causes COVID-19. Institutional protocols and algorithms that pertain to the evaluation of patients at risk for COVID-19 are in a state of rapid change based on information released by regulatory bodies including the CDC and federal and state organizations. These policies and algorithms were followed during the patient's care in the ED.    Domenic Moras, PA-C 05/25/19 1544    Domenic Moras, PA-C 05/25/19 1550     Veryl Speak, MD 05/27/19 [phone]

## 2019-05-25 NOTE — ED Provider Notes (Signed)
Medical Decision Making: Care of patient assumed at 3:15PM.  Agree with history.  See their note for further details.  Briefly, 78 y.o. male with PMH/PSH as below.  Past Medical History:  Diagnosis Date  . A-fib ([place])   . Anemia   . Blood transfusion   . BPH (benign prostatic hyperplasia)   . CHF (congestive heart failure) ([place])   . Diarrhea   . DM (diabetes mellitus) ([place])   . ESRD on hemodialysis ([place])    Started dialysis in 2009  . History of GI bleed    secondary to coumadin  . HTN (hypertension)   . Hyperlipidemia   . OSA (obstructive sleep apnea)    uses CPAP  . Secondary hyperparathyroidism of renal origin [facility])    Past Surgical History:  Procedure Laterality Date  . ABDOMINAL AORTOGRAM N/A 11/15/2018   Procedure: ABDOMINAL AORTOGRAM;  Surgeon: [name], MD;  Location: [facility];  Service: Cardiovascular;  Laterality: N/A;  . AMPUTATION Left 12/06/2018   Procedure: AMPUTATION DIGIT LEFT FIFTH TOE;  Surgeon: Angelia Mould, MD;  Location: [facility];  Service: Vascular;  Laterality: Left;  . AMPUTATION Bilateral 12/29/2018   Procedure: AMPUTATION ABOVE KNEE;  Surgeon: Waynetta Sandy, MD;  Location: [facility];  Service: Vascular;  Laterality: Bilateral;  . APPLICATION OF WOUND VAC Right 02/17/2019   Procedure: Application Of Wound Vac;  Surgeon: Marty Heck, MD;  Location: [facility];  Service: Vascular;  Laterality: Right;  . BVT  1/61/09   Left  Basilic Vein Transposition  . CHOLECYSTECTOMY    . EYE SURGERY     Catarct bil  . I&D EXTREMITY Right 02/17/2019   Procedure: Right above the kneee debridement;  Surgeon: Marty Heck, MD;  Location: [facility];  Service: Vascular;  Laterality: Right;  . INSERTION OF DIALYSIS CATHETER  05/28/2012   Procedure: INSERTION OF DIALYSIS CATHETER;  Surgeon: Mal Misty, MD;  Location: [facility];  Service: Vascular;  Laterality: Right;  . Left arm shuntogram.    . Left forearm loop graft with 6 mm Gore-Tex  graft.    . LOWER EXTREMITY ANGIOGRAPHY Bilateral 11/15/2018   Procedure: LOWER EXTREMITY ANGIOGRAPHY;  Surgeon: [name], MD;  Location: [facility];  Service: Cardiovascular;  Laterality: Bilateral;  . Pars plana vitrectomy with 25-gauge system    . PERIPHERAL VASCULAR ATHERECTOMY Left 11/15/2018   Procedure: PERIPHERAL VASCULAR ATHERECTOMY;  Surgeon: [name], MD;  Location: [facility];  Service: Cardiovascular;  Laterality: Left;  SFA with STENT  . PERIPHERAL VASCULAR BALLOON ANGIOPLASTY Left 11/15/2018   Procedure: PERIPHERAL VASCULAR BALLOON ANGIOPLASTY;  Surgeon: [name], MD;  Location: [facility];  Service: Cardiovascular;  Laterality: Left;  PT    Briefly, patient presents to the ED with chief complaint of altered mental status, fever, and hypotension.  Patient was seen in vascular surgery clinic today and sent to the ED for evaluation of his altered mental status and concerns for sepsis.  Patient febrile and hypotensive on arrival.  Code sepsis initiated, blood cultures obtained, and broad-spectrum antibiotics given with vancomycin and cefepime.  Current plan is as follows: Follow-up labs including UA.  Follow-up chest x-ray and x-ray of the right femur.  Update:  Leukocytosis of 17.5, hemoglobin 10.7.  Lactic acid 2.8.  SARS-CoV-2 negative.  Patient was evaluated by Dr. Donzetta Matters with vascular surgery.  He has low suspicion for infection of his right AKA.  Fistula also  does not appear to be infected.  Did not recommend further CT imaging at this time.  Will follow along as inpatient.  CXR shows mild pulmonary vascular congestion.  No focal airspace disease.  Shows no erosive changes at the right femur ostomy site to suggest acute osteomyelitis.  On reevaluation, patient blood pressure stabilized.  Map of 79.  I reexamined the patient.  He does have a sacral decubitus wound I could be the source of his infection.   Patient states that he  still makes urine.  Will obtain in and out catheter.  Patient admitted for further management.  I personally reviewed and interpreted all labs.   Radiology Studies:  Images viewed and used in my medical decision making. Formal interpretations by Radiology.     Trinidad Curet, MD 05/25/19 [place], Wenda Overland, MD 05/26/19 [phone]

## 2019-05-26 DIAGNOSIS — A419 Sepsis, unspecified organism: Principal | ICD-10-CM

## 2019-05-26 LAB — COMPREHENSIVE METABOLIC PANEL
ALT: 44 U/L (ref 0–44)
AST: 49 U/L — ABNORMAL HIGH (ref 15–41)
Albumin: 2.4 g/dL — ABNORMAL LOW (ref 3.5–5.0)
Alkaline Phosphatase: 235 U/L — ABNORMAL HIGH (ref 38–126)
Anion gap: 18 — ABNORMAL HIGH (ref 5–15)
BUN: 59 mg/dL — ABNORMAL HIGH (ref 8–23)
CO2: 21 mmol/L — ABNORMAL LOW (ref 22–32)
Calcium: 9.5 mg/dL (ref 8.9–10.3)
Chloride: 101 mmol/L (ref 98–111)
Creatinine, Ser: 4.98 mg/dL — ABNORMAL HIGH (ref 0.61–1.24)
GFR calc Af Amer: 12 mL/min — ABNORMAL LOW (ref >60)
GFR calc non Af Amer: 10 mL/min — ABNORMAL LOW (ref >60)
Glucose, Bld: 127 mg/dL — ABNORMAL HIGH (ref 70–99)
Potassium: 4.2 mmol/L (ref 3.5–5.1)
Sodium: 140 mmol/L (ref 135–145)
Total Bilirubin: 0.7 mg/dL (ref 0.3–1.2)
Total Protein: 6.8 g/dL (ref 6.5–8.1)

## 2019-05-26 LAB — MRSA PCR SCREENING: MRSA by PCR: POSITIVE — AB

## 2019-05-26 LAB — CBC
HCT: 28.3 % — ABNORMAL LOW (ref 39.0–52.0)
Hemoglobin: 8.6 g/dL — ABNORMAL LOW (ref 13.0–17.0)
MCH: 27.3 pg (ref 26.0–34.0)
MCHC: 30.4 g/dL (ref 30.0–36.0)
MCV: 89.8 fL (ref 80.0–100.0)
Platelets: 198 10*3/uL (ref 150–400)
RBC: 3.15 MIL/uL — ABNORMAL LOW (ref 4.22–5.81)
RDW: 20 % — ABNORMAL HIGH (ref 11.5–15.5)
WBC: 22.8 10*3/uL — ABNORMAL HIGH (ref 4.0–10.5)
nRBC: 0 % (ref 0.0–0.2)

## 2019-05-26 LAB — GLUCOSE, CAPILLARY
Glucose-Capillary: 79 mg/dL (ref 70–99)
Glucose-Capillary: 105 mg/dL — ABNORMAL HIGH (ref 70–99)
Glucose-Capillary: 138 mg/dL — ABNORMAL HIGH (ref 70–99)
Glucose-Capillary: 129 mg/dL — ABNORMAL HIGH (ref 70–99)

## 2019-05-26 LAB — PROTIME-INR
INR: 2.7 — ABNORMAL HIGH (ref 0.8–1.2)
Prothrombin Time: 28.1 seconds — ABNORMAL HIGH (ref 11.4–15.2)

## 2019-05-26 LAB — MAGNESIUM: Magnesium: 1.9 mg/dL (ref 1.7–2.4)

## 2019-05-26 LAB — PROCALCITONIN: Procalcitonin: 5.14 ng/mL

## 2019-05-26 MED ORDER — SODIUM CHLORIDE 0.9 % IV SOLN
2.0000 g | INTRAVENOUS | Status: DC
  Administered 2019-05-27 – 2019-05-30 (×2): 2 g via INTRAVENOUS
  Filled 2019-05-26 (×2): qty 2

## 2019-05-26 MED ORDER — CHLORHEXIDINE GLUCONATE CLOTH 2 % EX PADS
6.0000 | MEDICATED_PAD | Freq: Every day | CUTANEOUS | Status: DC
  Administered 2019-05-26 – 2019-05-31 (×5): 6 via TOPICAL

## 2019-05-26 MED ORDER — VANCOMYCIN HCL IN DEXTROSE 500-5 MG/100ML-% IV SOLN
500.0000 mg | INTRAVENOUS | Status: DC
  Administered 2019-05-27: 500 mg via INTRAVENOUS
  Filled 2019-05-26 (×4): qty 100

## 2019-05-26 MED ORDER — MUPIROCIN 2 % EX OINT
1.0000 "application " | TOPICAL_OINTMENT | Freq: Two times a day (BID) | CUTANEOUS | Status: DC
  Administered 2019-05-27 – 2019-05-31 (×8): 1 via NASAL
  Filled 2019-05-26 (×4): qty 22

## 2019-05-26 MED ORDER — HYDROCODONE-ACETAMINOPHEN 5-325 MG PO TABS: 1.0000 | ORAL_TABLET | Freq: Four times a day (QID) | ORAL | Status: DC | PRN

## 2019-05-26 NOTE — Consult Note (Addendum)
[facility] Renal Consultation Note    Indication for Consultation:  Management of ESRD/hemodialysis; anemia, hypertension/volume and secondary hyperparathyroidism   HPI: [name] is a 79 y.o. male with ESRD on HD MWF at [facility]. PMH paroxysmal a. Fib on warfarin, CHF, hypertension, DM, dementia, HLD, CVA, OSA. 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 Resides in SNF [facility].   Sent to ED from outpatient vascular appointment yesterday with concerns for sepsis. Febrile with temp 100.82F and hypotensive with SBPs 80s in office. Noted small open wound to R AKA incision.   Admitted for sepsis w/u. Labs significant for leukocytosis WBC 17.5>22.8. CXR with mild pulmonary vascular congestion. No signs of osteo on R femur xray.  Empiric antibiotics started. Blood cultures collected. Seen by Dr. Donzetta Matters in ED who did not feel AKA or AVF appeared infected. Did get fluid bolus in ED.   Seen and examined at bedside. Afebrile overnight and BP improved this am. Very drowsy this morning. Falls asleep during questioning but denies any specific pain complaints.   Due for routine dialysis today. Compliant with dialysis rx and has been reaching his dry weight.   Past Medical History:  Diagnosis Date  . A-fib ([place])   . Anemia   . Blood transfusion   . BPH (benign prostatic hyperplasia)   . CHF (congestive heart failure) ([place])   . Diarrhea   . DM (diabetes mellitus) ([place])   . ESRD on hemodialysis ([place])    Started dialysis in 2009  . History of GI bleed    secondary to coumadin  . HTN (hypertension)   . Hyperlipidemia   . OSA (obstructive sleep apnea)    uses CPAP  . Secondary hyperparathyroidism of renal origin [facility])    Past Surgical History:  Procedure Laterality Date  . ABDOMINAL AORTOGRAM N/A 11/15/2018   Procedure: ABDOMINAL AORTOGRAM;  Surgeon: [name], MD;  Location: [place] [facility];  Service: Cardiovascular;  Laterality: N/A;  . AMPUTATION Left  12/06/2018   Procedure: AMPUTATION DIGIT LEFT FIFTH TOE;  Surgeon: Angelia Mould, MD;  Location: [place];  Service: Vascular;  Laterality: Left;  . AMPUTATION Bilateral 12/29/2018   Procedure: AMPUTATION ABOVE KNEE;  Surgeon: Waynetta Sandy, MD;  Location: [place];  Service: Vascular;  Laterality: Bilateral;  . APPLICATION OF WOUND VAC Right 02/17/2019   Procedure: Application Of Wound Vac;  Surgeon: Marty Heck, MD;  Location: [place];  Service: Vascular;  Laterality: Right;  . BVT  7/68/08   Left  Basilic Vein Transposition  . CHOLECYSTECTOMY    . EYE SURGERY     Catarct bil  . I&D EXTREMITY Right 02/17/2019   Procedure: Right above the kneee debridement;  Surgeon: Marty Heck, MD;  Location: [place];  Service: Vascular;  Laterality: Right;  . INSERTION OF DIALYSIS CATHETER  05/28/2012   Procedure: INSERTION OF DIALYSIS CATHETER;  Surgeon: Mal Misty, MD;  Location: [place];  Service: Vascular;  Laterality: Right;  . Left arm shuntogram.    . Left forearm loop graft with 6 mm Gore-Tex graft.    . LOWER EXTREMITY ANGIOGRAPHY Bilateral 11/15/2018   Procedure: LOWER EXTREMITY ANGIOGRAPHY;  Surgeon: [name], MD;  Location: [place] [facility];  Service: Cardiovascular;  Laterality: Bilateral;  . Pars plana vitrectomy with 25-gauge system    . PERIPHERAL VASCULAR ATHERECTOMY Left 11/15/2018   Procedure: PERIPHERAL VASCULAR ATHERECTOMY;  Surgeon: [name], MD;  Location: [place] [facility];  Service: Cardiovascular;  Laterality: Left;  SFA with STENT  . PERIPHERAL VASCULAR BALLOON ANGIOPLASTY Left 11/15/2018   Procedure: PERIPHERAL VASCULAR BALLOON ANGIOPLASTY;  Surgeon: [name], MD;  Location: [place] [facility];  Service: Cardiovascular;  Laterality: Left;  PT   Family History  Problem Relation Age of Onset  . Alzheimer's disease Mother   . Diabetes Father        Amputation:  bilateral legs  . Cancer Daughter        breast cancer  .  Diabetes Son   . Heart disease Son        before age 70  . Hypertension Son   . Anesthesia problems Neg Hx    Social History:  reports that he quit smoking about 14 years ago. His smoking use included cigarettes. He has never used smokeless tobacco. He reports that he does not drink alcohol or use drugs. Allergies  Allergen Reactions  . Occlusive Silicone Sheets [Silicone] Other (See Comments)    "Allergic," per MAR  . Other Other (See Comments)    Unknown reaction to Occlusive adhesive- "Allergic," per MAR  . Tape Itching and Other (See Comments)    Use Cloth tape only, please   Prior to Admission medications   Medication Sig Start Date End Date Taking? Authorizing Provider  acetaminophen (TYLENOL) 500 MG tablet Take 1,000 mg by mouth 3 (three) times daily.    Yes [provider]  atorvastatin (LIPITOR) 20 MG tablet Take 20 mg by mouth daily.   Yes [provider]  B Complex-C-Folic Acid (RENAL VITAMIN PO) Take 1 tablet by mouth at bedtime.    Yes [provider]  calcium acetate (PHOSLO) 667 MG capsule Take 1,334 mg by mouth 3 (three) times daily with meals.   Yes [provider]  dicyclomine (BENTYL) 20 MG tablet Take 20 mg by mouth 3 (three) times daily before meals.   Yes [provider]  doxercalciferol (HECTOROL) 4 MCG/2ML injection Inject 1 mL (2 mcg total) into the vein every Monday, Wednesday, and Friday with hemodialysis. 12/05/18  Yes Nita Sells, MD  ferrous gluconate (FERGON) 240 (27 FE) MG tablet Take 1 tablet (240 mg total) by mouth 3 (three) times daily with meals. 12/04/18  Yes Nita Sells, MD  furosemide (LASIX) 40 MG tablet Take 40 mg by mouth daily.   Yes [provider]  loperamide (IMODIUM A-D) 2 MG tablet Take 4 mg by mouth See admin instructions. Take 2 tablets ( 4 mg) by mouth after 1st loose stool as needed for diarrhea   Yes [provider]  metoprolol tartrate (LOPRESSOR) 25 MG  tablet Take 25 mg by mouth See admin instructions. Take 25 mg by mouth two times a day on Sun/Tues/Thurs/Sat and 25 mg mg at bedtime on Mon/Wed/Fri   Yes [provider]  midodrine (PROAMATINE) 10 MG tablet Take 10 mg by mouth See admin instructions. Take one tablet (10 mg) by mouth on Monday, Wednesday, Friday 30 minutes prior to dialysis.   Yes [provider]  Multiple Vitamin (MULTIVITAMIN WITH MINERALS) TABS tablet Take 1 tablet by mouth daily.   Yes [provider]  Nutritional Supplements (ADULT NUTRITIONAL SUPPLEMENT PO) Take by mouth. Magic cup with dinner   Yes [provider]  pantoprazole (PROTONIX) 40 MG tablet Take 1 tablet (40 mg total) by mouth daily. 01/01/19  Yes Domenic Polite, MD  warfarin (COUMADIN) 2.5 MG tablet Take 2.5 mg by mouth daily.   Yes [provider]  Amino Acids-Protein Hydrolys (FEEDING SUPPLEMENT, PRO-STAT SUGAR FREE 64,) LIQD Take 30 mLs by mouth 2 (two) times daily. 9a and 5p    [provider]   Current Facility-Administered Medications  Medication Dose Route Frequency Provider Last Rate Last Dose  . acetaminophen (TYLENOL) tablet 650 mg  650 mg Oral Q6H PRN Amin, Ankit Chirag, MD       Or  . acetaminophen (TYLENOL) suppository 650 mg  650 mg Rectal Q6H PRN Amin, Ankit Chirag, MD      . calcium acetate (PHOSLO) capsule 1,334 mg  1,334 mg Oral TID WC Amin, Ankit Chirag, MD   1,334 mg at 05/26/19 0855  . Chlorhexidine Gluconate Cloth 2 % PADS 6 each  6 each Topical Q0600 Lynnda Child, PA-C      . dicyclomine (BENTYL) tablet 20 mg  20 mg Oral TID AC Amin, Ankit Chirag, MD   20 mg at 05/26/19 0855  . doxercalciferol (HECTOROL) injection 2 mcg  2 mcg Intravenous Q M,W,F-HD Amin, Ankit Chirag, MD      . feeding supplement (PRO-STAT SUGAR FREE 64) liquid 30 mL  30 mL Oral BID Amin, Ankit Chirag, MD   30 mL at 05/26/19 0855  . ferrous gluconate (FERGON) tablet 324 mg  324 mg Oral TID WC Amin, Ankit Chirag,  MD   324 mg at 05/26/19 0855  . hydrALAZINE (APRESOLINE) injection 10 mg  10 mg Intravenous Q4H PRN Amin, Ankit Chirag, MD      . HYDROcodone-acetaminophen (NORCO/VICODIN) 5-325 MG per tablet 1 tablet  1 tablet Oral Q6H PRN Barton Dubois, MD      . insulin aspart (novoLOG) injection 0-9 Units  0-9 Units Subcutaneous TID WC Amin, Ankit Chirag, MD      . metoprolol tartrate (LOPRESSOR) tablet 25 mg  25 mg Oral Q M,W,F-2000 Amin, Ankit Chirag, MD      . Derrill Memo ON 05/27/2019] metoprolol tartrate (LOPRESSOR) tablet 25 mg  25 mg Oral 2 times per day on Sun Tue Thu Sat Gerlean Ren Chirag, MD      . metroNIDAZOLE (FLAGYL) IVPB 500 mg  500 mg Intravenous Q8H Amin, Ankit Chirag, MD 100 mL/hr at 05/26/19 0854 500 mg at 05/26/19 0854  . midodrine (PROAMATINE) tablet 10 mg  10 mg Oral Q M,W,F-HD Amin, Ankit Chirag, MD      . multivitamin with minerals tablet 1 tablet  1 tablet Oral Daily Damita Lack, MD   1 tablet at 05/26/19 0855  . ondansetron (ZOFRAN) tablet 4 mg  4 mg Oral Q6H PRN Amin, Ankit Chirag, MD       Or  . ondansetron (ZOFRAN) injection 4 mg  4 mg Intravenous Q6H PRN Amin, Ankit Chirag, MD      . pantoprazole (PROTONIX) EC tablet 40 mg  40 mg Oral Daily Amin, Ankit Chirag, MD   40 mg at 05/26/19 0855  . polyethylene glycol (MIRALAX / GLYCOLAX) packet 17 g  17 g Oral Daily PRN Amin, Ankit Chirag, MD      . senna-docusate (Senokot-S) tablet 1 tablet  1 tablet Oral QHS PRN Amin, Ankit Chirag, MD      . vancomycin variable dose per unstable renal function (pharmacist dosing)   Does not apply See admin instructions Amin, Ankit Chirag, MD         ROS: As per HPI otherwise negative.  Physical Exam: Vitals:   05/26/19 0517 05/26/19 0518 05/26/19 0740 05/26/19 1046  BP: (!) 95/56 (!) 98/53  108/67  Pulse: 94 97  94  Resp: 18     Temp: 98.3 F (36.8 C)     TempSrc: Oral     SpO2: 91%     Weight:   54.5 kg      General: Frail elderly male NAD  Head: NCAT sclera not icteric MMM Neck:  Supple. No JVD Lungs: CTA bilaterally without wheezes, rales, or rhonchi.  Heart: Irregular soft systolic murmur  Abdomen: soft NT + BS Lower extremities: bilat AKAs. R AKA with bloody drainage from dehisced incision site  Neuro: Somnolent. Not following commands.  Psych:  Responds with one word answers  Dialysis Access: LUE AVF +bruit No signs of infection   Labs: Basic Metabolic Panel: Recent Labs  Lab 05/25/19 1421 05/26/19 0214  NA 138 140  K 4.8 4.2  CL 95* 101  CO2 26 21*  GLUCOSE 130* 127*  BUN 50* 59*  CREATININE 4.76* 4.98*  CALCIUM 10.4* 9.5   Liver Function Tests: Recent Labs  Lab 05/25/19 1421 05/26/19 0214  AST 77* 49*  ALT 58* 44  ALKPHOS 288* 235*  BILITOT 1.2 0.7  PROT 8.2* 6.8  ALBUMIN 2.9* 2.4*   No results for input(s): LIPASE, AMYLASE in the last 168 hours. No results for input(s): AMMONIA in the last 168 hours. CBC: Recent Labs  Lab 05/25/19 1421 05/26/19 0214  WBC 17.5* 22.8*  NEUTROABS 14.2*  --   HGB 10.7* 8.6*  HCT 35.1* 28.3*  MCV 89.1 89.8  PLT 251 198   Cardiac Enzymes: No results for input(s): CKTOTAL, CKMB, CKMBINDEX, TROPONINI in the last 168 hours. CBG: Recent Labs  Lab 05/25/19 2223 05/26/19 0810  GLUCAP 108* 79   Iron Studies: No results for input(s): IRON, TIBC, TRANSFERRIN, FERRITIN in the last 72 hours. Studies/Results: Dg Chest Port 1 View  Result Date: 05/25/2019 CLINICAL DATA:  Sepsis. EXAM: PORTABLE CHEST 1 VIEW COMPARISON:  Single-view of the chest 02/15/2019 and 10/22/2018. PA and lateral chest 12/27/2018. FINDINGS: Lung volumes are low with some crowding of the bronchovascular structures. Mild vascular congestion is seen. Heart size is upper normal. Aortic atherosclerosis noted. No pneumothorax or pleural effusion. IMPRESSION: Mild pulmonary vascular congestion.  No focal airspace disease. Atherosclerosis. Electronically Signed   By: Inge Rise M.D.   On: 05/25/2019 15:51   Dg Femur Portable Min 2 Views  Right  Result Date: 05/25/2019 CLINICAL DATA:  78 year old male with a history of potential osteomyelitis EXAM: RIGHT FEMUR PORTABLE 2 VIEW COMPARISON:  02/15/2019 FINDINGS: Surgical changes of right above knee amputation. There are no erosive changes at the osteotomy site. There has been development of a small bone spur on the medial aspect of the distal cortex, in the region of the previous MRI findings. Triangular shaped focus of heterotopic bone within the soft tissues on the oblique image. No subcutaneous gas or focal soft tissue swelling identified. Vascular calcifications. IMPRESSION: No erosive changes at the right femur osteotomy site that would suggest acute osteomyelitis. Focal bone spur and heterotopic ossification within the soft tissues, potentially postoperative changes versus chronic reactive changes. Electronically Signed   By: Corrie Mckusick D.O.   On: 05/25/2019 15:54    Dialysis Orders:  AF MWF 4h 400/1.5x EDW 53kg 3K/2.25Ca Na Linear UFP 2  L AVF No heparin  Mircera 150 q 2 weeks (last 5/25) No VDRA   Assessment/Plan: 1. Febrile illness/leukocytosis.  Admitted for sepsis w/u. Empiric antibiotics/Blood cultures per primary.  Has Hx of prior Staph infection from R AKA  in Feb. VVS following. No plans for surgical intervention at present.   2. AMS. Likely related to #1. Dementia at baseline per notes.  3. ESRD -  MWF. HD today on schedule. K 4.2  4. Chronic hypotension/volume  - On midodrine. Some volume on CXR. UF to EDW as tolerated.  5. Anemia  - Hgb 10.7 >8.6. Some oozing from R AKA incision. Follow trends. ESA recently dosed as outpatient.  6. Metabolic bone disease -  Corr Ca ^. No VDRA. Follow trends.  7. Nutrition - Prot supp for low albumin 8. Afib -on chronic warfarin   Lynnda Child PA-C [place] Pager [phone] 05/26/2019, 11:19 AM   Pt seen, examined and agree w A/P as above.  Kelly Splinter  MD 05/26/2019, 3:08 PM

## 2019-05-26 NOTE — Progress Notes (Addendum)
  Progress Note    05/26/2019 8:02 AM  Subjective:  Somnolent this morning, quickly falls back asleep   Vitals:   05/26/19 0517 05/26/19 0518  BP: (!) 95/56 (!) 98/53  Pulse: 94 97  Resp: 18   Temp: 98.3 F (36.8 C)   SpO2: 91%    Physical Exam: Lungs:  Non labored Incisions:  L AKA incision well healed Extremities:  L AV fistula with good thrill, no fluctuance or other sign of infection; R medial AKA incision with induration and some murky sanguinous drainage with manipulation Neurologic: somnolent  CBC    Component Value Date/Time   WBC 22.8 (H) 05/26/2019 0214   RBC 3.15 (L) 05/26/2019 0214   HGB 8.6 (L) 05/26/2019 0214   HCT 28.3 (L) 05/26/2019 0214   HCT 27.1 (L) 11/09/2018 0230   PLT 198 05/26/2019 0214   MCV 89.8 05/26/2019 0214   MCH 27.3 05/26/2019 0214   MCHC 30.4 05/26/2019 0214   RDW 20.0 (H) 05/26/2019 0214   LYMPHSABS 1.2 05/25/2019 1421   MONOABS 1.9 (H) 05/25/2019 1421   EOSABS 0.1 05/25/2019 1421   BASOSABS 0.1 05/25/2019 1421    BMET    Component Value Date/Time   NA 140 05/26/2019 0214   NA 141 02/27/2019   K 4.2 05/26/2019 0214   CL 101 05/26/2019 0214   CO2 21 (L) 05/26/2019 0214   GLUCOSE 127 (H) 05/26/2019 0214   BUN 59 (H) 05/26/2019 0214   BUN 64 (A) 02/27/2019   CREATININE 4.98 (H) 05/26/2019 0214   CALCIUM 9.5 05/26/2019 0214   CALCIUM 8.3 (L) 10/25/2008 1450   GFRNONAA 10 (L) 05/26/2019 0214   GFRAA 12 (L) 05/26/2019 0214    INR    Component Value Date/Time   INR 2.7 (H) 05/26/2019 0214     Intake/Output Summary (Last 24 hours) at 05/26/2019 0802 Last data filed at 05/26/2019 0138 Gross per 24 hour  Intake 100 ml  Output -  Net 100 ml     Assessment/Plan:  78 y.o. male admitted with fever and confusion treated for sepsis with suspected source being R AKA  -WBC count rising -Agree with broad spectrum abx -L AV fistula unremarkable -With rising WBC, will discuss converting coumadin to heparin in case R AKA needs  surgical debridement if no other infectious source identified   Dagoberto Ligas, PA-C [facility] [phone] 05/26/2019 8:02 AM  I have seen and evaluated the patient. I agree with the PA note as documented above. WBC continues to rise.  Agree with Dr. Claretha Cooper assessment that R AKA looks like granulation tissue with no overt purulent drainage.  Would probably benefit from non-contrast CT of R AKA to r/o any underlying fluid collection or absecess.  Marty Heck, MD [facility] [place] Office: [phone] Pager: [phone]

## 2019-05-26 NOTE — Progress Notes (Addendum)
[place] for warfarin discontinued;  Start IV heparin when INR < 2  (no bolus) Indication: h/o atrial fibrillation   Assessment: 30 yom on warfarin PTA for afib presented 05/25/19 with confusion and fever. INR therapeutic at 2.4 on admit.l No active bleed issues documented.  Noted DDI with Flagyl -can increase warfarin effect, watch INR closely. Warfarin discontinued 05/25/28 by MD for possible need for surgery (R AKA debridement).  Pharmacy consulted to start IV heparin infusion (no bolus) when INR <2.  INR is 2.7 today,  No need for IV heparin at this time.  Will monitor INR in AM. Hgb 10.7> 8.6, pltc 251>198k.  No  bleeding reported except nephrologist noted some oozing from R AKA incision.    PTA warfarin dose: 2.5 mg daily per last [place] home note 05/18/19 (last dose 5/27 PTA).  Goal of Therapy:  INR 2-3 Monitor platelets by anticoagulation protocol: Yes   Plan:  Warfarin discontinued per MD. Will start IV heparin (no bolus) when INR < 2 Daily INR  Monitor CBC, s/sx bleeding, DDI  Thank you for allowing pharmacy to be part of this patients care team.  Nicole Cella, [place] Pharmacist Pager: [phone] Please check AMION for all [place] phone numbers After 10:00 PM, call [place] [phone] 05/26/2019 2:16 PM

## 2019-05-26 NOTE — Progress Notes (Addendum)
PROGRESS NOTE    [name]  AOZ:[id number] DOB: 1941/05/10 DOA: 05/25/2019 PCP: Hennie Duos, MD     Brief Narrative:  78 y.o. 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  Apparently home health nurse had called from abdomen forearms and vascular and vein specialist office with concerns of some drainage from the right above-the-knee amputation therefore vascular clinic had advised by home nurse to send the patient to the ED for further evaluation.  In the ER patient was noted to be slightly confused with labs suggestive of lactic acidosis, transaminitis and leukocytosis.  Concerns for sepsis.  Chest x-ray showed slight cardiac vascular congestion.  Was evaluated by vascular surgery who recommended medical management for infectious etiology with no acute indication for surgical intervention.  Patient was admitted to medicine for further intervention.   Assessment & Plan: 1-Sepsis ([place]) -With concern for infection of right AKA stump -Sepsis features appropriately resolving -Continue IV antibiotics -Follow-up vascular surgery recommendations -Continue dialysis therapy as per nephrology service. -Will provide pain medications, but will be judicious with increased sedation. -X-ray of the femoral demonstrated no erosive changes suggestive of osteomyelitis. -Follow culture data.  2-Type 2 diabetes mellitus with hypertension and end stage renal disease on dialysis ([place]) -Continue sliding scale insulin -Follow CBGs and adjust hypoglycemic regimen as needed.  3-HYPERTENSION, BENIGN -Blood pressure is soft and stable -Continue management with dialysis. -Will continue to monitor vital signs. -Continue metoprolol  4-PAF (paroxysmal atrial  fibrillation) ([facility]) -Rate controlled currently -Will monitor on telemetry for the next 24 hours -Coumadin will be placed on hold in case that surgical debridement or any other procedures needed. -Will use heparin for secondary prevention once INR less than 2.  5-anabolic encephalopathy -Patient with underlying history of dementia.  Records reviewed. -Continue to avoid to minimize the use of sedative agents -Constant reorientation -Continue treatment for sepsis.  6-ESRD (end stage renal disease) ([place]) -Continue dialysis therapy as per nephrology service. -Patient on Monday, Wednesday and Friday schedule.  7-secondary hyperparathyroidism -Continue the use of PhosLo  8-anemia of chronic kidney disease -Continue the use of iron supplementation and Epogen therapy as per nephrology discretion. -Hemoglobin stable.   DVT prophylaxis: Chronically on Coumadin; likely transition to heparin in case surgical procedures to be done. Code Status: Full code Family Communication: No family at bedside. Disposition Plan: Continue IV antibiotics, nephrology has been consulted and will pursue dialysis treatment; follow clinical response and follow recommendations by vascular surgery.  Consultants:   Neurosurgery  Nephrology  Procedures:   See below for x-ray reports  Antimicrobials:  Anti-infectives (From admission, onward)   Start     Dose/Rate Route Frequency Ordered Stop   05/25/19 1745  ceFEPIme (MAXIPIME) 2 g in sodium chloride 0.9 % 100 mL IVPB  Status:  Discontinued     2 g 200 mL/hr over 30 Minutes Intravenous  Once 05/25/19 1739 05/25/19 1743   05/25/19 1745  metroNIDAZOLE (FLAGYL) IVPB 500 mg     500 mg 100 mL/hr over 60 Minutes Intravenous Every 8 hours 05/25/19 1739     05/25/19 1745  vancomycin (VANCOCIN) IVPB 1000 mg/200 mL premix  Status:  Discontinued     1,000 mg 200 mL/hr over 60 Minutes Intravenous  Once 05/25/19 1739 05/25/19 1743   05/25/19 1445  vancomycin variable  dose per unstable renal  function (pharmacist dosing)      Does not apply See admin instructions 05/25/19 1445     05/25/19 1445  ceFAZolin (ANCEF) IVPB 2g/100 mL premix  Status:  Discontinued     2 g 200 mL/hr over 30 Minutes Intravenous  Once 05/25/19 1435 05/25/19 1437   05/25/19 1445  ceFEPIme (MAXIPIME) 2 g in sodium chloride 0.9 % 100 mL IVPB     2 g 200 mL/hr over 30 Minutes Intravenous  Once 05/25/19 1437 05/25/19 1623   05/25/19 1445  vancomycin (VANCOCIN) 1,250 mg in sodium chloride 0.9 % 250 mL IVPB     1,250 mg 166.7 mL/hr over 90 Minutes Intravenous  Once 05/25/19 1444 05/25/19 1813       Subjective: Afebrile, no chest pain, no shortness of breath, no nausea, no vomiting.  Objective: Vitals:   05/26/19 0517 05/26/19 0518 05/26/19 0740 05/26/19 1046  BP: (!) 95/56 (!) 98/53  108/67  Pulse: 94 97  94  Resp: 18     Temp: 98.3 F (36.8 C)   98.5 F (36.9 C)  TempSrc: Oral   Oral  SpO2: 91%   93%  Weight:   54.5 kg     Intake/Output Summary (Last 24 hours) at 05/26/2019 1238 Last data filed at 05/26/2019 1146 Gross per 24 hour  Intake 200 ml  Output --  Net 200 ml   Filed Weights   05/26/19 0740  Weight: 54.5 kg    Examination: General exam: Afebrile, somnolent, lethargic, oriented x1 currently. Respiratory system: Good air movement bilaterally, no wheezing, no rales. Cardiovascular system: Rate controlled, positive systolic ejection murmur, no rubs, no gallops. Gastrointestinal system: Abdomen is nondistended, soft and nontender. No organomegaly or masses felt. Normal bowel sounds heard. Central nervous system: Alert and oriented. No focal neurological deficits. Extremities: Left BKA, left upper extremity with AV fistula with good thrill.  Right AKA with palpated induration and mild discharge appreciated from medial section of stump scar. Skin: No rashes, lesions or ulcers Psychiatry: Judgement and insight appears impaired secondary to current altered mental  status.  Data Reviewed: I have personally reviewed following labs and imaging studies  CBC: Recent Labs  Lab 05/25/19 1421 05/26/19 0214  WBC 17.5* 22.8*  NEUTROABS 14.2*  --   HGB 10.7* 8.6*  HCT 35.1* 28.3*  MCV 89.1 89.8  PLT 251 829   Basic Metabolic Panel: Recent Labs  Lab 05/25/19 1421 05/26/19 0214  NA 138 140  K 4.8 4.2  CL 95* 101  CO2 26 21*  GLUCOSE 130* 127*  BUN 50* 59*  CREATININE 4.76* 4.98*  CALCIUM 10.4* 9.5  MG  --  1.9   GFR: Estimated Creatinine Clearance: 9.4 mL/min (A) (by C-G formula based on SCr of 4.98 mg/dL (H)).   Liver Function Tests: Recent Labs  Lab 05/25/19 1421 05/26/19 0214  AST 77* 49*  ALT 58* 44  ALKPHOS 288* 235*  BILITOT 1.2 0.7  PROT 8.2* 6.8  ALBUMIN 2.9* 2.4*   Coagulation Profile: Recent Labs  Lab 05/25/19 1900 05/26/19 0214  INR 2.4* 2.7*   CBG: Recent Labs  Lab 05/25/19 2223 05/26/19 0810 05/26/19 1153  GLUCAP 108* 79 105*   Thyroid Function Tests: Recent Labs    05/25/19 1900  TSH 1.839   Urine analysis:    Component Value Date/Time   COLORURINE YELLOW 10/20/2008 [place] 10/20/2008 0942   LABSPEC 1.011 10/20/2008 0942   PHURINE 6.0 10/20/2008 [place] 10/20/2008 9371  HGBUR LARGE (A) 10/20/2008 0942   BILIRUBINUR NEGATIVE 10/20/2008 [place] 10/20/2008 0942   PROTEINUR 100 (A) 10/20/2008 0942   UROBILINOGEN 1.0 10/20/2008 0942   NITRITE NEGATIVE 10/20/2008 0942   LEUKOCYTESUR SMALL (A) 10/20/2008 0942    Recent Results (from the past 240 hour(s))  SARS Coronavirus 2 (CEPHEID - Performed in [facility] hospital lab), Hosp Order     Status: None   Collection Time: 05/25/19  3:03 PM  Result Value Ref Range Status   SARS Coronavirus 2 NEGATIVE NEGATIVE Final    Comment: (NOTE) If result is NEGATIVE SARS-CoV-2 target nucleic acids are NOT DETECTED. The SARS-CoV-2 RNA is generally detectable in upper and lower  respiratory specimens  during the acute phase of infection. The lowest  concentration of SARS-CoV-2 viral copies this assay can detect is 250  copies / mL. A negative result does not preclude SARS-CoV-2 infection  and should not be used as the sole basis for treatment or other  patient management decisions.  A negative result may occur with  improper specimen collection / handling, submission of specimen other  than nasopharyngeal swab, presence of viral mutation(s) within the  areas targeted by this assay, and inadequate number of viral copies  (<250 copies / mL). A negative result must be combined with clinical  observations, patient history, and epidemiological information. If result is POSITIVE SARS-CoV-2 target nucleic acids are DETECTED. The SARS-CoV-2 RNA is generally detectable in upper and lower  respiratory specimens dur ing the acute phase of infection.  Positive  results are indicative of active infection with SARS-CoV-2.  Clinical  correlation with patient history and other diagnostic information is  necessary to determine patient infection status.  Positive results do  not rule out bacterial infection or co-infection with other viruses. If result is PRESUMPTIVE POSTIVE SARS-CoV-2 nucleic acids MAY BE PRESENT.   A presumptive positive result was obtained on the submitted specimen  and confirmed on repeat testing.  While 2019 novel coronavirus  (SARS-CoV-2) nucleic acids may be present in the submitted sample  additional confirmatory testing may be necessary for epidemiological  and / or clinical management purposes  to differentiate between  SARS-CoV-2 and other Sarbecovirus currently known to infect humans.  If clinically indicated additional testing with an alternate test  methodology [phone]) is advised. The SARS-CoV-2 RNA is generally  detectable in upper and lower respiratory sp ecimens during the acute  phase of infection. The expected result is Negative. Fact Sheet for Patients:   StrictlyIdeas.no Fact Sheet for Healthcare Providers: BankingDealers.co.za This test is not yet approved or cleared by the [place] FDA and has been authorized for detection and/or diagnosis of SARS-CoV-2 by FDA under an Emergency Use Authorization (EUA).  This EUA will remain in effect (meaning this test can be used) for the duration of the COVID-19 declaration under Section 564(b)(1) of the Act, 21 U.S.C. section 360bbb-3(b)(1), unless the authorization is terminated or revoked sooner. Performed at [facility], [place] [address] [place], [place] [address]      Radiology Studies: Dg Chest Port 1 View  Result Date: 05/25/2019 CLINICAL DATA:  Sepsis. EXAM: PORTABLE CHEST 1 VIEW COMPARISON:  Single-view of the chest 02/15/2019 and 10/22/2018. PA and lateral chest 12/27/2018. FINDINGS: Lung volumes are low with some crowding of the bronchovascular structures. Mild vascular congestion is seen. Heart size is upper normal. Aortic atherosclerosis noted. No pneumothorax or pleural effusion. IMPRESSION: Mild pulmonary vascular congestion.  No focal airspace disease. Atherosclerosis.  Electronically Signed   By: Inge Rise M.D.   On: 05/25/2019 15:51   Dg Femur Portable Min 2 Views Right  Result Date: 05/25/2019 CLINICAL DATA:  78 year old male with a history of potential osteomyelitis EXAM: RIGHT FEMUR PORTABLE 2 VIEW COMPARISON:  02/15/2019 FINDINGS: Surgical changes of right above knee amputation. There are no erosive changes at the osteotomy site. There has been development of a small bone spur on the medial aspect of the distal cortex, in the region of the previous MRI findings. Triangular shaped focus of heterotopic bone within the soft tissues on the oblique image. No subcutaneous gas or focal soft tissue swelling identified. Vascular calcifications. IMPRESSION: No erosive changes at the right femur osteotomy site that would suggest  acute osteomyelitis. Focal bone spur and heterotopic ossification within the soft tissues, potentially postoperative changes versus chronic reactive changes. Electronically Signed   By: Corrie Mckusick D.O.   On: 05/25/2019 15:54    Scheduled Meds:  calcium acetate  1,334 mg Oral TID WC   Chlorhexidine Gluconate Cloth  6 each Topical Q0600   dicyclomine  20 mg Oral TID AC   feeding supplement (PRO-STAT SUGAR FREE 64)  30 mL Oral BID   ferrous gluconate  324 mg Oral TID WC   insulin aspart  0-9 Units Subcutaneous TID WC   metoprolol tartrate  25 mg Oral Q M,W,F-2000   [START ON 05/27/2019] metoprolol tartrate  25 mg Oral 2 times per day on Sun Tue Thu Sat   midodrine  10 mg Oral Q M,W,F-HD   multivitamin with minerals  1 tablet Oral Daily   pantoprazole  40 mg Oral Daily   vancomycin variable dose per unstable renal function (pharmacist dosing)   Does not apply See admin instructions   Continuous Infusions:  metronidazole 500 mg (05/26/19 0854)     LOS: 1 day    Time spent: 35 minutes. Greater than 50% of this time was spent in direct contact with the patient, coordinating care and discussing relevant ongoing clinical issues, including sepsis and concerns for infection in his right AKA.  Patient is somnolent and confused; there is concerns for dementia at baseline base on records review.  Will follow recommendations by vascular surgery and nephrology service.  Continue hemodialysis therapy (Monday, Wednesday and Friday); continue IV antibiotics and close monitoring of vancomycin through.     Barton Dubois, MD [facility] Pager [phone]  05/26/2019, 12:38 PM

## 2019-05-26 NOTE — Progress Notes (Signed)
Renal Navigator notified OP HD clinic/SW of patient's admission and negative COVID rapid test result to provide continuity of care and safety.  Alphonzo Cruise, [place] Renal Navigator [phone]

## 2019-05-27 LAB — GLUCOSE, CAPILLARY
Glucose-Capillary: 137 mg/dL — ABNORMAL HIGH (ref 70–99)
Glucose-Capillary: 135 mg/dL — ABNORMAL HIGH (ref 70–99)
Glucose-Capillary: 117 mg/dL — ABNORMAL HIGH (ref 70–99)
Glucose-Capillary: 123 mg/dL — ABNORMAL HIGH (ref 70–99)

## 2019-05-27 LAB — COMPREHENSIVE METABOLIC PANEL
ALT: 55 U/L — ABNORMAL HIGH (ref 0–44)
AST: 55 U/L — ABNORMAL HIGH (ref 15–41)
Albumin: 2.4 g/dL — ABNORMAL LOW (ref 3.5–5.0)
Alkaline Phosphatase: 272 U/L — ABNORMAL HIGH (ref 38–126)
Anion gap: 18 — ABNORMAL HIGH (ref 5–15)
BUN: 51 mg/dL — ABNORMAL HIGH (ref 8–23)
CO2: 23 mmol/L (ref 22–32)
Calcium: 9.3 mg/dL (ref 8.9–10.3)
Chloride: 97 mmol/L — ABNORMAL LOW (ref 98–111)
Creatinine, Ser: 4.29 mg/dL — ABNORMAL HIGH (ref 0.61–1.24)
GFR calc Af Amer: 14 mL/min — ABNORMAL LOW (ref >60)
GFR calc non Af Amer: 12 mL/min — ABNORMAL LOW (ref >60)
Glucose, Bld: 117 mg/dL — ABNORMAL HIGH (ref 70–99)
Potassium: 3.8 mmol/L (ref 3.5–5.1)
Sodium: 138 mmol/L (ref 135–145)
Total Bilirubin: 0.8 mg/dL (ref 0.3–1.2)
Total Protein: 7 g/dL (ref 6.5–8.1)

## 2019-05-27 LAB — PROTIME-INR
INR: 2.7 — ABNORMAL HIGH (ref 0.8–1.2)
Prothrombin Time: 28.4 seconds — ABNORMAL HIGH (ref 11.4–15.2)

## 2019-05-27 LAB — CBC
HCT: 27.3 % — ABNORMAL LOW (ref 39.0–52.0)
Hemoglobin: 8.3 g/dL — ABNORMAL LOW (ref 13.0–17.0)
MCH: 26.9 pg (ref 26.0–34.0)
MCHC: 30.4 g/dL (ref 30.0–36.0)
MCV: 88.6 fL (ref 80.0–100.0)
Platelets: 228 10*3/uL (ref 150–400)
RBC: 3.08 MIL/uL — ABNORMAL LOW (ref 4.22–5.81)
RDW: 19.9 % — ABNORMAL HIGH (ref 11.5–15.5)
WBC: 17.6 10*3/uL — ABNORMAL HIGH (ref 4.0–10.5)
nRBC: 0 % (ref 0.0–0.2)

## 2019-05-27 LAB — MAGNESIUM: Magnesium: 1.8 mg/dL (ref 1.7–2.4)

## 2019-05-27 MED ORDER — VANCOMYCIN HCL IN DEXTROSE 500-5 MG/100ML-% IV SOLN
INTRAVENOUS | Status: AC
  Filled 2019-05-27: qty 100

## 2019-05-27 NOTE — Progress Notes (Addendum)
PROGRESS NOTE    [name]  NFA:[id number] DOB: 01-Apr-1941 DOA: 05/25/2019 PCP: Hennie Duos, MD     Brief Narrative:  78 y.o. 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  Apparently home health nurse had called from abdomen forearms and vascular and vein specialist office with concerns of some drainage from the right above-the-knee amputation therefore vascular clinic had advised by home nurse to send the patient to the ED for further evaluation.  In the ER patient was noted to be slightly confused with labs suggestive of lactic acidosis, transaminitis and leukocytosis.  Concerns for sepsis.  Chest x-ray showed slight cardiac vascular congestion.  Was evaluated by vascular surgery who recommended medical management for infectious etiology with no acute indication for surgical intervention.  Patient was admitted to medicine for further intervention.   Assessment & Plan: 1-Sepsis ([place]) -With concern for infection of right AKA stump -Sepsis features appropriately resolving -Continue IV antibiotics -Follow-up vascular surgery recommendations -Continue dialysis therapy as per nephrology service. -Will provide pain medications, but will be judicious with increased sedation. -X-ray of the femoral demonstrated no erosive changes suggestive of osteomyelitis. -Vascular surgery to decide if noncontrast CT of right AKA needed to rule out any underlying fluid collection, abscess or need of any surgical revision. -Follow culture data.  2-Type 2 diabetes mellitus with hypertension and end stage renal disease on dialysis ([place]) -Continue sliding scale insulin -Follow CBGs and adjust hypoglycemic regimen as needed.  3-HYPERTENSION, BENIGN -Blood pressure  is soft and stable -Continue management with dialysis. -Will continue to monitor vital signs. -Continue metoprolol  4-PAF (paroxysmal atrial fibrillation) (HCC) -Rate controlled currently -Will monitor on telemetry for the next 24 hours -Coumadin will be placed on hold in case that surgical debridement or any other procedures needed. -Will use heparin for secondary prevention once INR less than 2.  5-anabolic encephalopathy -Patient with underlying history of dementia.  Records reviewed. -Continue to avoid to minimize the use of sedative agents -Constant reorientation -Continue treatment for sepsis.  6-ESRD (end stage renal disease) ([place]) -Continue dialysis therapy as per nephrology service. -Patient on Monday, Wednesday and Friday schedule.  7-secondary hyperparathyroidism -Continue the use of PhosLo  8-anemia of chronic kidney disease -Continue the use of iron supplementation and Epogen therapy as per nephrology discretion. -Hemoglobin stable.   DVT prophylaxis: Chronically on Coumadin; likely transition to heparin in case surgical procedures to be done. Code Status: Full code Family Communication: No family at bedside. Disposition Plan: Continue IV antibiotics, nephrology has been consulted and will pursue dialysis treatment; follow clinical response and follow recommendations by vascular surgery.  Consultants:   Neurosurgery  Nephrology  Procedures:   See below for x-ray reports  Antimicrobials:  Anti-infectives (From admission, onward)   Start     Dose/Rate Route Frequency Ordered Stop   05/26/19 2200  ceFEPIme (MAXIPIME) 2 g in sodium chloride 0.9 % 100 mL IVPB     2 g 200 mL/hr over 30 Minutes Intravenous Every M-W-F 05/26/19 1439     05/26/19 1930  vancomycin (VANCOCIN) IVPB 500 mg/100 ml premix     500 mg 100 mL/hr over 60 Minutes Intravenous Every M-W-F (Hemodialysis) 05/26/19 1439     05/25/19 1745  ceFEPIme (MAXIPIME) 2 g in sodium chloride 0.9 % 100  mL IVPB  Status:  Discontinued  2 g 200 mL/hr over 30 Minutes Intravenous  Once 05/25/19 1739 05/25/19 1743   05/25/19 1745  metroNIDAZOLE (FLAGYL) IVPB 500 mg     500 mg 100 mL/hr over 60 Minutes Intravenous Every 8 hours 05/25/19 1739     05/25/19 1745  vancomycin (VANCOCIN) IVPB 1000 mg/200 mL premix  Status:  Discontinued     1,000 mg 200 mL/hr over 60 Minutes Intravenous  Once 05/25/19 1739 05/25/19 1743   05/25/19 1445  vancomycin variable dose per unstable renal function (pharmacist dosing)  Status:  Discontinued      Does not apply See admin instructions 05/25/19 1445 05/26/19 1439   05/25/19 1445  ceFAZolin (ANCEF) IVPB 2g/100 mL premix  Status:  Discontinued     2 g 200 mL/hr over 30 Minutes Intravenous  Once 05/25/19 1435 05/25/19 1437   05/25/19 1445  ceFEPIme (MAXIPIME) 2 g in sodium chloride 0.9 % 100 mL IVPB     2 g 200 mL/hr over 30 Minutes Intravenous  Once 05/25/19 1437 05/25/19 1623   05/25/19 1445  vancomycin (VANCOCIN) 1,250 mg in sodium chloride 0.9 % 250 mL IVPB     1,250 mg 166.7 mL/hr over 90 Minutes Intravenous  Once 05/25/19 1444 05/25/19 1813      Subjective: Afebrile, no chest pain, no nausea, no vomiting.  Reports pain in his buttocks.  Continues to have some drainage out of medial section of his stump (serosanguineous in appearance).  Objective: Vitals:   05/27/19 0608 05/27/19 0839 05/27/19 1416 05/27/19 1421  BP: (!) 109/57 105/61  (!) 111/55  Pulse: 99 89 72   Resp: 18 18  18   Temp: 99.5 F (37.5 C) 98.9 F (37.2 C) 99.6 F (37.6 C)   TempSrc: Oral Oral Oral   SpO2: 97% 98% 96%   Weight: 52.8 kg       Intake/Output Summary (Last 24 hours) at 05/27/2019 1823 Last data filed at 05/27/2019 1537 Gross per 24 hour  Intake 499.14 ml  Output 2000 ml  Net -1500.86 ml   Filed Weights   05/26/19 2227 05/27/19 0324 05/27/19 0608  Weight: 54.5 kg 52.3 kg 52.8 kg    Examination: General exam: Alert, awake, oriented x 2; reports pain in his  buttocks; no chest pain, no shortness of breath, no nausea, no vomiting. Respiratory system: Clear to auscultation. Respiratory effort normal. Cardiovascular system:RRR.  Positive systolic murmur, no rubs, no gallops. Gastrointestinal system: Abdomen is nondistended, soft and nontender. No organomegaly or masses felt. Normal bowel sounds heard. Central nervous system: Alert and oriented. No focal neurological deficits. Extremities: Left BKA; right AKA.  On his right stump that is small drainage appreciated from medial section of his thumb scar and there is mild induration on palpation.  Patient denies any tenderness. Skin: No rashes, no petechiae.  Patient with 2 decubitus ulcers present prior to admission; not demonstrating signs of superimposed infection. Psychiatry: Judgement and insight appear normal. Mood & affect appropriate.   Data Reviewed: I have personally reviewed following labs and imaging studies  CBC: Recent Labs  Lab 05/25/19 1421 05/26/19 0214 05/27/19 0348  WBC 17.5* 22.8* 17.6*  NEUTROABS 14.2*  --   --   HGB 10.7* 8.6* 8.3*  HCT 35.1* 28.3* 27.3*  MCV 89.1 89.8 88.6  PLT 251 198 250   Basic Metabolic Panel: Recent Labs  Lab 05/25/19 1421 05/26/19 0214 05/27/19 0348  NA 138 140 138  K 4.8 4.2 3.8  CL 95* 101 97*  CO2 26 21*  23  GLUCOSE 130* 127* 117*  BUN 50* 59* 51*  CREATININE 4.76* 4.98* 4.29*  CALCIUM 10.4* 9.5 9.3  MG  --  1.9 1.8   GFR: Estimated Creatinine Clearance: 10.6 mL/min (A) (by C-G formula based on SCr of 4.29 mg/dL (H)).   Liver Function Tests: Recent Labs  Lab 05/25/19 1421 05/26/19 0214 05/27/19 0348  AST 77* 49* 55*  ALT 58* 44 55*  ALKPHOS 288* 235* 272*  BILITOT 1.2 0.7 0.8  PROT 8.2* 6.8 7.0  ALBUMIN 2.9* 2.4* 2.4*   Coagulation Profile: Recent Labs  Lab 05/25/19 1900 05/26/19 0214 05/27/19 0651  INR 2.4* 2.7* 2.7*   CBG: Recent Labs  Lab 05/26/19 1655 05/26/19 2149 05/27/19 0819 05/27/19 1210 05/27/19  1640  GLUCAP 138* 129* 137* 135* 117*   Thyroid Function Tests: Recent Labs    05/25/19 1900  TSH 1.839   Urine analysis:    Component Value Date/Time   COLORURINE YELLOW 10/20/2008 [place] 10/20/2008 0942   LABSPEC 1.011 10/20/2008 0942   PHURINE 6.0 10/20/2008 0942   GLUCOSEU NEGATIVE 10/20/2008 0942   HGBUR LARGE (A) 10/20/2008 0942   BILIRUBINUR NEGATIVE 10/20/2008 0942   KETONESUR NEGATIVE 10/20/2008 0942   PROTEINUR 100 (A) 10/20/2008 0942   UROBILINOGEN 1.0 10/20/2008 0942   NITRITE NEGATIVE 10/20/2008 0942   LEUKOCYTESUR SMALL (A) 10/20/2008 0942    Recent Results (from the past 240 hour(s))  Blood Culture (routine x 2)     Status: None (Preliminary result)   Collection Time: 05/25/19  2:24 PM  Result Value Ref Range Status   Specimen Description BLOOD RIGHT ANTECUBITAL  Final   Special Requests   Final    BOTTLES DRAWN AEROBIC AND ANAEROBIC Blood Culture adequate volume   Culture   Final    NO GROWTH 2 DAYS Performed at [facility], [place] [address] [place], [place] [address]    Report Status PENDING  Incomplete  Blood Culture (routine x 2)     Status: None (Preliminary result)   Collection Time: 05/25/19  2:45 PM  Result Value Ref Range Status   Specimen Description BLOOD SITE NOT SPECIFIED  Final   Special Requests   Final    BOTTLES DRAWN AEROBIC ONLY Blood Culture adequate volume   Culture   Final    NO GROWTH 2 DAYS Performed at [facility], [address] [place],  [address]    Report Status PENDING  Incomplete  SARS Coronavirus 2 (CEPHEID - Performed in [facility] lab), Hosp Order     Status: None   Collection Time: 05/25/19  3:03 PM  Result Value Ref Range Status   SARS Coronavirus 2 NEGATIVE NEGATIVE Final    Comment: (NOTE) If result is NEGATIVE SARS-CoV-2 target nucleic acids are NOT DETECTED. The SARS-CoV-2 RNA is generally detectable in upper and lower  respiratory specimens during the  acute phase of infection. The lowest  concentration of SARS-CoV-2 viral copies this assay can detect is 250  copies / mL. A negative result does not preclude SARS-CoV-2 infection  and should not be used as the sole basis for treatment or other  patient management decisions.  A negative result may occur with  improper specimen collection / handling, submission of specimen other  than nasopharyngeal swab, presence of viral mutation(s) within the  areas targeted by this assay, and inadequate number of viral copies  (<250 copies / mL). A negative result must be combined with clinical  observations, patient history, and epidemiological information. If result is POSITIVE SARS-CoV-2 target nucleic acids are DETECTED. The SARS-CoV-2 RNA is generally detectable in upper and lower  respiratory specimens dur ing the acute phase of infection.  Positive  results are indicative of active infection with SARS-CoV-2.  Clinical  correlation with patient history and other diagnostic information is  necessary to determine patient infection status.  Positive results do  not rule out bacterial infection or co-infection with other viruses. If result is PRESUMPTIVE POSTIVE SARS-CoV-2 nucleic acids MAY BE PRESENT.   A presumptive positive result was obtained on the submitted specimen  and confirmed on repeat testing.  While 2019 novel coronavirus  (SARS-CoV-2) nucleic acids may be present in the submitted sample  additional confirmatory testing may be necessary for epidemiological  and / or clinical management purposes  to differentiate between  SARS-CoV-2 and other Sarbecovirus currently known to infect humans.  If clinically indicated additional testing with an alternate test  methodology [phone]) is advised. The SARS-CoV-2 RNA is generally  detectable in upper and lower respiratory sp ecimens during the acute  phase of infection. The expected result is Negative. Fact Sheet for Patients:   StrictlyIdeas.no Fact Sheet for Healthcare Providers: BankingDealers.co.za This test is not yet approved or cleared by the [place] FDA and has been authorized for detection and/or diagnosis of SARS-CoV-2 by FDA under an Emergency Use Authorization (EUA).  This EUA will remain in effect (meaning this test can be used) for the duration of the COVID-19 declaration under Section 564(b)(1) of the Act, 21 U.S.C. section 360bbb-3(b)(1), unless the authorization is terminated or revoked sooner. Performed at [facility], [place] [address] [place], [place] [address]   MRSA PCR Screening     Status: Abnormal   Collection Time: 05/26/19  1:54 PM  Result Value Ref Range Status   MRSA by PCR POSITIVE (A) NEGATIVE Final    Comment:        The GeneXpert MRSA Assay (FDA approved for NASAL specimens only), is one component of a comprehensive MRSA colonization surveillance program. It is not intended to diagnose MRSA infection nor to guide or monitor treatment for MRSA infections. RESULT CALLED TO, READ BACK BY AND VERIFIED WITH: Sherlyn Lick RN 17:25 05/26/19 ([email or web address]) Performed at [facility], [place] [address], [place], [place] [address]      Radiology Studies: No results found.  Scheduled Meds: . calcium acetate  1,334 mg Oral TID WC  . Chlorhexidine Gluconate Cloth  6 each Topical Q0600  . dicyclomine  20 mg Oral TID AC  . feeding supplement (PRO-STAT SUGAR FREE 64)  30 mL Oral BID  . ferrous gluconate  324 mg Oral TID WC  . insulin aspart  0-9 Units Subcutaneous TID WC  . metoprolol tartrate  25 mg Oral Q M,W,F-2000  . metoprolol tartrate  25 mg Oral 2 times per day on Sun Tue Thu Sat  . midodrine  10 mg Oral Q M,W,F-HD  . multivitamin with minerals  1 tablet Oral Daily  . mupirocin ointment  1 application Nasal BID  . pantoprazole  40 mg Oral Daily  . vancomycin  500 mg Intravenous Q M,W,F-HD   Continuous Infusions: .  ceFEPime (MAXIPIME) IV 2 g (05/27/19 0323)  . metronidazole 500 mg (05/27/19 1650)     LOS: 2 days    Time spent: 35 minutes.  More than 50% of the time has been in direct contact with the patient, coordinating care and  discussing relevant ongoing clinical issues including sepsis and concern for infection in his right AKA.  Patient is more awake, able to follow commands and expressing discomfort in his buttocks.  We will continue to follow recommendations by nephrology service and vascular surgery; continue hemodialysis therapy and continue IV antibiotics with close monitoring of his vancomycin through.      Barton Dubois, MD [facility] Pager [phone]  05/27/2019, 6:23 PM

## 2019-05-27 NOTE — Progress Notes (Signed)
ANTICOAGULATION CONSULT NOTE  Pharmacy Consult : to start IV heparin when INR < 2  (no bolus);  Warfarin on hold. Indication: h/o atrial fibrillation   Assessment: 61 yom on warfarin PTA for afib presented 05/25/19 with confusion and fever. INR therapeutic at 2.4 on admit.l  Noted DDI with Flagyl -can increase warfarin effect.  Warfarin discontinued 05/25/28 by MD for possible need for surgery (R AKA debridement).  Pharmacy consulted to start IV heparin infusion (no bolus) when INR <2. Today 05/27/19  INR is 2.7 today,  No need for IV heparin at this time.  Will follow up INR in AM. Hgb 10.7> 8.6>8.5, pltc wnl/stable. No acute bleeding reported except nephrologist noted 5/29 some oozing from R AKA incision.    PTA warfarin dose: 2.5 mg daily per last [place] home note 05/18/19 (last dose 5/27 PTA).  Goal of Therapy:  INR 2-3 Monitor platelets by anticoagulation protocol: Yes   Plan:  Will start IV heparin (no bolus) when INR < 2 Daily INR  Monitor CBC, s/sx bleeding, DDI  Thank you for allowing pharmacy to be part of this patients care team.  Nicole Cella, [place] Pharmacist Pager: [phone] Please check AMION for all [place] phone numbers After 10:00 PM, call [place] 05/27/2019 8:34 AM

## 2019-05-27 NOTE — Progress Notes (Signed)
[facility]  Subjective  - No complaints.  WBC 22 --> 17.  Afebrile.   Objective 105/61 89 99.5 F (37.5 C) (Oral) 18 97%  Intake/Output Summary (Last 24 hours) at 05/27/2019 0947 Last data filed at 05/27/2019 0341 Gross per 24 hour  Intake 342.24 ml  Output 2000 ml  Net -1657.76 ml    R AKA stump with some granulation tissue and minimal serosanguinous drainage  Laboratory Lab Results: Recent Labs    05/26/19 0214 05/27/19 0348  WBC 22.8* 17.6*  HGB 8.6* 8.3*  HCT 28.3* 27.3*  PLT 198 228   BMET Recent Labs    05/26/19 0214 05/27/19 0348  NA 140 138  K 4.2 3.8  CL 101 97*  CO2 21* 23  GLUCOSE 127* 117*  BUN 59* 51*  CREATININE 4.98* 4.29*  CALCIUM 9.5 9.3    COAG Lab Results  Component Value Date   INR 2.7 (H) 05/27/2019   INR 2.7 (H) 05/26/2019   INR 2.4 (H) 05/25/2019   No results found for: PTT  Assessment/Planning:  WBC down from 22 ---> 17 today on IV antibiotics.  No growth on blood cultures.  From granulation tissue around right AKA stump - no overt purulent drainage or tenderness.  If no other source could consider non-contrast CT of R AKA to r/o underlying fluid collection, etc.  We will continue to follow.  Marty Heck 05/27/2019 9:47 AM --

## 2019-05-27 NOTE — Progress Notes (Addendum)
[facility] Progress Note   Subjective:  Seen in room. C/o "my butt hurts" - known decub wound per RN notes. No CP/dyspnea. Attempted to help reposition him in bed.  Objective Vitals:   05/27/19 0211 05/27/19 0324 05/27/19 0608 05/27/19 0839  BP: (!) 91/54  (!) 109/57 105/61  Pulse: (!) 102  99 89  Resp:   18 18  Temp:   99.5 F (37.5 C) 98.9 F (37.2 C)  TempSrc:   Oral Oral  SpO2:   97% 98%  Weight:  52.3 kg 52.8 kg    Physical Exam General: Frail man, NAD. Much more alert, speaking in sentences. Heart: RRR; 3/6 systolic murmur Lungs: CTAB Abdomen: soft, non-tender Extremities: B AKAs - R AKA wrapped (not examined by me). Dialysis Access: LUE AVF + thrill  Additional Objective Labs: Basic Metabolic Panel: Recent Labs  Lab 05/25/19 1421 05/26/19 0214 05/27/19 0348  NA 138 140 138  K 4.8 4.2 3.8  CL 95* 101 97*  CO2 26 21* 23  GLUCOSE 130* 127* 117*  BUN 50* 59* 51*  CREATININE 4.76* 4.98* 4.29*  CALCIUM 10.4* 9.5 9.3   Liver Function Tests: Recent Labs  Lab 05/25/19 1421 05/26/19 0214 05/27/19 0348  AST 77* 49* 55*  ALT 58* 44 55*  ALKPHOS 288* 235* 272*  BILITOT 1.2 0.7 0.8  PROT 8.2* 6.8 7.0  ALBUMIN 2.9* 2.4* 2.4*   CBC: Recent Labs  Lab 05/25/19 1421 05/26/19 0214 05/27/19 0348  WBC 17.5* 22.8* 17.6*  NEUTROABS 14.2*  --   --   HGB 10.7* 8.6* 8.3*  HCT 35.1* 28.3* 27.3*  MCV 89.1 89.8 88.6  PLT 251 198 228   Blood Culture    Component Value Date/Time   SDES BLOOD SITE NOT SPECIFIED 05/25/2019 1445   SPECREQUEST  05/25/2019 1445    BOTTLES DRAWN AEROBIC ONLY Blood Culture adequate volume   CULT  05/25/2019 1445    NO GROWTH < 24 HOURS Performed at [facility], [place] [address], [place], [place] [address]    REPTSTATUS PENDING 05/25/2019 1445   Studies/Results: Dg Chest Port 1 View  Result Date: 05/25/2019 CLINICAL DATA:  Sepsis. EXAM: PORTABLE CHEST 1 VIEW COMPARISON:  Single-view of the chest 02/15/2019 and  10/22/2018. PA and lateral chest 12/27/2018. FINDINGS: Lung volumes are low with some crowding of the bronchovascular structures. Mild vascular congestion is seen. Heart size is upper normal. Aortic atherosclerosis noted. No pneumothorax or pleural effusion. IMPRESSION: Mild pulmonary vascular congestion.  No focal airspace disease. Atherosclerosis. Electronically Signed   By: Inge Rise M.D.   On: 05/25/2019 15:51   Dg Femur Portable Min 2 Views Right  Result Date: 05/25/2019 CLINICAL DATA:  78 year old male with a history of potential osteomyelitis EXAM: RIGHT FEMUR PORTABLE 2 VIEW COMPARISON:  02/15/2019 FINDINGS: Surgical changes of right above knee amputation. There are no erosive changes at the osteotomy site. There has been development of a small bone spur on the medial aspect of the distal cortex, in the region of the previous MRI findings. Triangular shaped focus of heterotopic bone within the soft tissues on the oblique image. No subcutaneous gas or focal soft tissue swelling identified. Vascular calcifications. IMPRESSION: No erosive changes at the right femur osteotomy site that would suggest acute osteomyelitis. Focal bone spur and heterotopic ossification within the soft tissues, potentially postoperative changes versus chronic reactive changes. Electronically Signed   By: Corrie Mckusick D.O.   On: 05/25/2019 15:54   Medications: . ceFEPime (  MAXIPIME) IV 2 g (05/27/19 0323)  . metronidazole 500 mg (05/27/19 0842)   . calcium acetate  1,334 mg Oral TID WC  . Chlorhexidine Gluconate Cloth  6 each Topical Q0600  . dicyclomine  20 mg Oral TID AC  . feeding supplement (PRO-STAT SUGAR FREE 64)  30 mL Oral BID  . ferrous gluconate  324 mg Oral TID WC  . insulin aspart  0-9 Units Subcutaneous TID WC  . metoprolol tartrate  25 mg Oral Q M,W,F-2000  . metoprolol tartrate  25 mg Oral 2 times per day on Sun Tue Thu Sat  . midodrine  10 mg Oral Q M,W,F-HD  . multivitamin with minerals  1  tablet Oral Daily  . mupirocin ointment  1 application Nasal BID  . pantoprazole  40 mg Oral Daily  . vancomycin  500 mg Intravenous Q M,W,F-HD    Dialysis Orders: AF MWF  4h 400/1.5x EDW 53kg 3K/2.25Ca Na Linear UFP 2 L AVF No heparin  Mircera 150 q 2 weeks (last 5/25) No VDRA   Assessment/Plan: 1. Sepsis/Fever: WBC ^ - Started on empiric Vanc/Cefepime/Flagyl. BCx 5/28 negative so far. Hx of prior Staph infection from R AKA in Feb. VVS following - no plans for surgical intervention at present. RN note also indicates 2 sacral wounds - unable to examine today. Per primary. 2. AMS: Improving today, likely related to #1. Dementia at baseline per notes.  3. ESRD: Continue HD per usual MWF schedule - next 6/1. 4. Chronic hypotension/volume: On midodrine. CXR with some pulm edema, 2L off with HD yesterday. 5. Anemia: Hgb 8.3 - some oozing from R AKA incision. Follow trends. ESA recently dosed as outpatient.  6. Metabolic bone disease -  Corr Ca ^. No VDRA. Follow trends.  7. Nutrition - Prot supp for low albumin 8. Afib -on chronic warfarin   Veneta Penton, PA-C 05/27/2019, 11:24 AM  [facility] Pager: [phone]  Pt seen, examined and agree w A/P as above.  Kelly Splinter  MD 05/27/2019, 4:50 PM

## 2019-05-28 LAB — COMPREHENSIVE METABOLIC PANEL
ALT: 42 U/L (ref 0–44)
AST: 31 U/L (ref 15–41)
Albumin: 2.2 g/dL — ABNORMAL LOW (ref 3.5–5.0)
Alkaline Phosphatase: 261 U/L — ABNORMAL HIGH (ref 38–126)
Anion gap: 14 (ref 5–15)
BUN: 80 mg/dL — ABNORMAL HIGH (ref 8–23)
CO2: 21 mmol/L — ABNORMAL LOW (ref 22–32)
Calcium: 9.3 mg/dL (ref 8.9–10.3)
Chloride: 102 mmol/L (ref 98–111)
Creatinine, Ser: 6.12 mg/dL — ABNORMAL HIGH (ref 0.61–1.24)
GFR calc Af Amer: 9 mL/min — ABNORMAL LOW (ref >60)
GFR calc non Af Amer: 8 mL/min — ABNORMAL LOW (ref >60)
Glucose, Bld: 151 mg/dL — ABNORMAL HIGH (ref 70–99)
Potassium: 3.7 mmol/L (ref 3.5–5.1)
Sodium: 137 mmol/L (ref 135–145)
Total Bilirubin: 0.6 mg/dL (ref 0.3–1.2)
Total Protein: 6.5 g/dL (ref 6.5–8.1)

## 2019-05-28 LAB — CBC
HCT: 25.5 % — ABNORMAL LOW (ref 39.0–52.0)
Hemoglobin: 7.8 g/dL — ABNORMAL LOW (ref 13.0–17.0)
MCH: 26.7 pg (ref 26.0–34.0)
MCHC: 30.6 g/dL (ref 30.0–36.0)
MCV: 87.3 fL (ref 80.0–100.0)
Platelets: 204 10*3/uL (ref 150–400)
RBC: 2.92 MIL/uL — ABNORMAL LOW (ref 4.22–5.81)
RDW: 19.4 % — ABNORMAL HIGH (ref 11.5–15.5)
WBC: 16.4 10*3/uL — ABNORMAL HIGH (ref 4.0–10.5)
nRBC: 0 % (ref 0.0–0.2)

## 2019-05-28 LAB — GLUCOSE, CAPILLARY
Glucose-Capillary: 119 mg/dL — ABNORMAL HIGH (ref 70–99)
Glucose-Capillary: 131 mg/dL — ABNORMAL HIGH (ref 70–99)
Glucose-Capillary: 127 mg/dL — ABNORMAL HIGH (ref 70–99)
Glucose-Capillary: 151 mg/dL — ABNORMAL HIGH (ref 70–99)

## 2019-05-28 LAB — PROTIME-INR
INR: 2.6 — ABNORMAL HIGH (ref 0.8–1.2)
Prothrombin Time: 27.7 seconds — ABNORMAL HIGH (ref 11.4–15.2)

## 2019-05-28 LAB — MAGNESIUM: Magnesium: 2 mg/dL (ref 1.7–2.4)

## 2019-05-28 NOTE — Progress Notes (Signed)
Pharmacy Antibiotic Note  [name] is a 78 y.o. male admitted on 05/25/2019 with wound infection.  Pharmacy was consulted 5/28  for vancomycin and cefepime dosing.  Patient has ESRD, on dialysis MWF.  Last HD done  Late PM on 5/29,  Vancomycin and cefepime given post HD early AM 05/27/19.  Afebrile, Tmax 99.6, WBC 17.5 >22.8>17.6>16.4k X-ray of the femoral demonstrated erosive changes suggestive of osteomyelitis. Blood cultures no growth to date.  Vascular surgery noted today,  somegranulation tissue around right AKA stump - no overt purulent drainage or tenderness and that seems to be responding to IV antibiotics.VVS continuing to follow.   Plan: Vancomycin  500 mg IV qHD MWF  Cefepime 2g IV  qHD MWF  Continues on Flagyl 500mg  IV q8h per MD dosing. Monitor clinical status, dialysis schedule, cultures, and length of therapy Pre-HD Vancomycin levels per protocol.    Temp (24hrs), Avg:99.1 F (37.3 C), Min:98.5 F (36.9 C), Max:99.6 F (37.6 C)  Recent Labs  Lab 05/25/19 1421 05/25/19 1621 05/25/19 1900 05/25/19 2201 05/26/19 0214 05/27/19 0348 05/28/19 0253  WBC 17.5*  --   --   --  22.8* 17.6* 16.4*  CREATININE 4.76*  --   --   --  4.98* 4.29* 6.12*  LATICACIDVEN 2.8* 2.2* 1.9 2.0*  --   --   --     Estimated Creatinine Clearance: 7.4 mL/min (A) (by C-G formula based on SCr of 6.12 mg/dL (H)).    Allergies  Allergen Reactions  . Occlusive Silicone Sheets [Silicone] Other (See Comments)    "Allergic," per MAR  . Other Other (See Comments)    Unknown reaction to Occlusive adhesive- "Allergic," per MAR  . Tape Itching and Other (See Comments)    Use Cloth tape only, please    Antimicrobials this admission: Vancomycin 5/28 >> Cefepime 5/28 >> Flagyl 5/28>>  Dose adjustments this admission:   Microbiology results: 5/29 MRSA PCR: positive 5/28 BCx: ngtd 5/28 COVID: negative   Thank you for allowing pharmacy to be a part of this patient's care. Nicole Cella, RPh Clinical Pharmacist [phone] Please check AMION for all [place] phone numbers by unit After 10:00 PM, call [place] [phone] 05/28/2019 8:12 AM

## 2019-05-28 NOTE — Progress Notes (Addendum)
PROGRESS NOTE    [name]  JWJ:[id number] DOB: 1941/08/20 DOA: 05/25/2019 PCP: Hennie Duos, MD     Brief Narrative:  78 y.o. 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  Apparently home health nurse had called from abdomen forearms and vascular and vein specialist office with concerns of some drainage from the right above-the-knee amputation therefore vascular clinic had advised by home nurse to send the patient to the ED for further evaluation.  In the ER patient was noted to be slightly confused with labs suggestive of lactic acidosis, transaminitis and leukocytosis.  Concerns for sepsis.  Chest x-ray showed slight cardiac vascular congestion.  Was evaluated by vascular surgery who recommended medical management for infectious etiology with no acute indication for surgical intervention.  Patient was admitted to medicine for further intervention.   Assessment & Plan: 1-Sepsis ([place]) -With concern for infection of right AKA stump -Sepsis features appropriately resolving -Continue IV antibiotics -Follow-up vascular surgery recommendations -Patient afebrile, WBCs trending down -Continue dialysis therapy as per nephrology service. -Will provide pain medications, but will be judicious with increased sedation. -X-ray of the femoral demonstrated no erosive changes suggestive of osteomyelitis. -Vascular surgery to decide if noncontrast CT of right AKA needed to rule out any underlying fluid collection, abscess or need of any surgical revision. -Follow culture data.  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   3-HYPERTENSION, BENIGN -Blood pressure is soft and stable -Continue management with dialysis. -Will continue to monitor vital signs. -Continue metoprolol  4-PAF (paroxysmal atrial fibrillation) ([facility]) -Rate controlled currently -Will monitor on telemetry for the next 24 hours -Coumadin will be placed on hold in case that surgical debridement or any other procedures needed. -Will use heparin for secondary prevention once INR less than 2.  Currently 2.6  5-anabolic encephalopathy -Patient with underlying history of dementia.  Records reviewed. -Continue to avoid to minimize the use of sedative agents -Constant reorientation -Continue treatment for sepsis.  6-ESRD (end stage renal disease) ([place]) -Continue dialysis therapy as per nephrology service. -Patient on Monday, Wednesday and Friday schedule.  7-secondary hyperparathyroidism -Will continue the use of PhosLo  8-anemia of chronic kidney disease -Continue the use of iron supplementation and Epogen therapy as per nephrology discretion. -Hemoglobin being stable.   DVT prophylaxis: Chronically on Coumadin; likely transition to heparin in case surgical procedures to be done. Code Status: Full code Family Communication: No family at bedside. Disposition Plan: Continue IV antibiotics, nephrology has been consulted and will pursue dialysis treatment; follow clinical response and follow recommendations by vascular surgery.  Consultants:   Neurosurgery  Nephrology  Procedures:   See below for x-ray reports  Antimicrobials:  Anti-infectives (From admission, onward)   Start     Dose/Rate Route Frequency Ordered Stop   05/26/19 2200  ceFEPIme (MAXIPIME) 2 g in sodium chloride 0.9 % 100 mL IVPB     2 g 200 mL/hr over 30 Minutes Intravenous Every M-W-F 05/26/19 1439     05/26/19 1930  vancomycin (VANCOCIN) IVPB 500 mg/100 ml premix     500 mg 100 mL/hr over 60 Minutes Intravenous Every M-W-F (Hemodialysis) 05/26/19 1439      05/25/19 1745  ceFEPIme (MAXIPIME) 2 g in sodium chloride  0.9 % 100 mL IVPB  Status:  Discontinued     2 g 200 mL/hr over 30 Minutes Intravenous  Once 05/25/19 1739 05/25/19 1743   05/25/19 1745  metroNIDAZOLE (FLAGYL) IVPB 500 mg     500 mg 100 mL/hr over 60 Minutes Intravenous Every 8 hours 05/25/19 1739     05/25/19 1745  vancomycin (VANCOCIN) IVPB 1000 mg/200 mL premix  Status:  Discontinued     1,000 mg 200 mL/hr over 60 Minutes Intravenous  Once 05/25/19 1739 05/25/19 1743   05/25/19 1445  vancomycin variable dose per unstable renal function (pharmacist dosing)  Status:  Discontinued      Does not apply See admin instructions 05/25/19 1445 05/26/19 1439   05/25/19 1445  ceFAZolin (ANCEF) IVPB 2g/100 mL premix  Status:  Discontinued     2 g 200 mL/hr over 30 Minutes Intravenous  Once 05/25/19 1435 05/25/19 1437   05/25/19 1445  ceFEPIme (MAXIPIME) 2 g in sodium chloride 0.9 % 100 mL IVPB     2 g 200 mL/hr over 30 Minutes Intravenous  Once 05/25/19 1437 05/25/19 1623   05/25/19 1445  vancomycin (VANCOCIN) 1,250 mg in sodium chloride 0.9 % 250 mL IVPB     1,250 mg 166.7 mL/hr over 90 Minutes Intravenous  Once 05/25/19 1444 05/25/19 1813      Subjective: No fever, no chest pain, no nausea, no vomiting, no shortness of breath.  Reports intermittent discomfort in his buttocks secondary to chronic decubitus sacral ulcers.  Objective: Vitals:   05/27/19 1416 05/27/19 1421 05/27/19 2217 05/28/19 0504  BP:  (!) 111/55 (!) 120/59 119/70  Pulse: 72  92 90  Resp:  18 16 18   Temp: 99.6 F (37.6 C)  99.4 F (37.4 C) 98.5 F (36.9 C)  TempSrc: Oral  Oral Oral  SpO2: 96%  100% 99%  Weight:        Intake/Output Summary (Last 24 hours) at 05/28/2019 9678 Last data filed at 05/27/2019 1537 Gross per 24 hour  Intake 256.9 ml  Output -  Net 256.9 ml   Filed Weights   05/26/19 2227 05/27/19 0324 05/27/19 0608  Weight: 54.5 kg 52.3 kg 52.8 kg    Examination: General exam: Alert,  awake, oriented x 2 (missed year); patient denies acute complaints; no chest pain, no shortness of breath, no nausea, no vomiting.  Expresses to having some intermittent discomfort in his buttocks secondary to chronic decubitus ulcers. Respiratory system: Clear to auscultation. Respiratory effort normal. Cardiovascular system:RRR. No murmurs, rubs, gallops. Gastrointestinal system: Abdomen is nondistended, soft and nontender. No organomegaly or masses felt. Normal bowel sounds heard. Central nervous system: Alert and oriented. No focal neurological deficits. Extremities: Positive left BKA; positive for right AKA.  On his right thumb he continue demonstrating a small serosanguineous drainage from medial section and some granulation tissue.  Reported no pain on palpation. Skin: No rashes, no petechiae. 2 stage 2 decubitus sacral ulcers which were present at time of admission, are once again seen without signs of superimposed infection. Psychiatry: Judgement and insight appear normal. Mood & affect appropriate.    Data Reviewed: I have personally reviewed following labs and imaging studies  CBC: Recent Labs  Lab 05/25/19 1421 05/26/19 0214 05/27/19 0348 05/28/19 0253  WBC 17.5* 22.8* 17.6* 16.4*  NEUTROABS 14.2*  --   --   --   HGB 10.7* 8.6* 8.3* 7.8*  HCT 35.1* 28.3* 27.3* 25.5*  MCV 89.1 89.8 88.6 87.3  PLT 251 198 228  650   Basic Metabolic Panel: Recent Labs  Lab 05/25/19 1421 05/26/19 0214 05/27/19 0348 05/28/19 0253  NA 138 140 138 137  K 4.8 4.2 3.8 3.7  CL 95* 101 97* 102  CO2 26 21* 23 21*  GLUCOSE 130* 127* 117* 151*  BUN 50* 59* 51* 80*  CREATININE 4.76* 4.98* 4.29* 6.12*  CALCIUM 10.4* 9.5 9.3 9.3  MG  --  1.9 1.8 2.0   GFR: Estimated Creatinine Clearance: 7.4 mL/min (A) (by C-G formula based on SCr of 6.12 mg/dL (H)).   Liver Function Tests: Recent Labs  Lab 05/25/19 1421 05/26/19 0214 05/27/19 0348 05/28/19 0253  AST 77* 49* 55* 31  ALT 58* 44 55* 42   ALKPHOS 288* 235* 272* 261*  BILITOT 1.2 0.7 0.8 0.6  PROT 8.2* 6.8 7.0 6.5  ALBUMIN 2.9* 2.4* 2.4* 2.2*   Coagulation Profile: Recent Labs  Lab 05/25/19 1900 05/26/19 0214 05/27/19 0651 05/28/19 0253  INR 2.4* 2.7* 2.7* 2.6*   CBG: Recent Labs  Lab 05/26/19 2149 05/27/19 0819 05/27/19 1210 05/27/19 1640 05/27/19 2324  GLUCAP 129* 137* 135* 117* 123*   Thyroid Function Tests: Recent Labs    05/25/19 1900  TSH 1.839   Urine analysis:    Component Value Date/Time   COLORURINE YELLOW 10/20/2008 0942   APPEARANCEUR CLEAR 10/20/2008 0942   LABSPEC 1.011 10/20/2008 0942   PHURINE 6.0 10/20/2008 0942   GLUCOSEU NEGATIVE 10/20/2008 0942   HGBUR LARGE (A) 10/20/2008 0942   BILIRUBINUR NEGATIVE 10/20/2008 0942   KETONESUR NEGATIVE 10/20/2008 0942   PROTEINUR 100 (A) 10/20/2008 0942   UROBILINOGEN 1.0 10/20/2008 0942   NITRITE NEGATIVE 10/20/2008 0942   LEUKOCYTESUR SMALL (A) 10/20/2008 0942    Recent Results (from the past 240 hour(s))  Blood Culture (routine x 2)     Status: None (Preliminary result)   Collection Time: 05/25/19  2:24 PM  Result Value Ref Range Status   Specimen Description BLOOD RIGHT ANTECUBITAL  Final   Special Requests   Final    BOTTLES DRAWN AEROBIC AND ANAEROBIC Blood Culture adequate volume   Culture   Final    NO GROWTH 2 DAYS Performed at [facility], [place] [address], [place], [place] [address]    Report Status PENDING  Incomplete  Blood Culture (routine x 2)     Status: None (Preliminary result)   Collection Time: 05/25/19  2:45 PM  Result Value Ref Range Status   Specimen Description BLOOD SITE NOT SPECIFIED  Final   Special Requests   Final    BOTTLES DRAWN AEROBIC ONLY Blood Culture adequate volume   Culture   Final    NO GROWTH 2 DAYS Performed at [facility], [address], [place], [place] [address]    Report Status PENDING  Incomplete  SARS Coronavirus 2 (CEPHEID - Performed in [facility] hospital lab),  Hosp Order     Status: None   Collection Time: 05/25/19  3:03 PM  Result Value Ref Range Status   SARS Coronavirus 2 NEGATIVE NEGATIVE Final    Comment: (NOTE) If result is NEGATIVE SARS-CoV-2 target nucleic acids are NOT DETECTED. The SARS-CoV-2 RNA is generally detectable in upper and lower  respiratory specimens during the acute phase of infection. The lowest  concentration of SARS-CoV-2 viral copies this assay can detect is 250  copies / mL. A negative result does not preclude SARS-CoV-2 infection  and should not be used as the sole basis for treatment or  other  patient management decisions.  A negative result may occur with  improper specimen collection / handling, submission of specimen other  than nasopharyngeal swab, presence of viral mutation(s) within the  areas targeted by this assay, and inadequate number of viral copies  (<250 copies / mL). A negative result must be combined with clinical  observations, patient history, and epidemiological information. If result is POSITIVE SARS-CoV-2 target nucleic acids are DETECTED. The SARS-CoV-2 RNA is generally detectable in upper and lower  respiratory specimens dur ing the acute phase of infection.  Positive  results are indicative of active infection with SARS-CoV-2.  Clinical  correlation with patient history and other diagnostic information is  necessary to determine patient infection status.  Positive results do  not rule out bacterial infection or co-infection with other viruses. If result is PRESUMPTIVE POSTIVE SARS-CoV-2 nucleic acids MAY BE PRESENT.   A presumptive positive result was obtained on the submitted specimen  and confirmed on repeat testing.  While 2019 novel coronavirus  (SARS-CoV-2) nucleic acids may be present in the submitted sample  additional confirmatory testing may be necessary for epidemiological  and / or clinical management purposes  to differentiate between  SARS-CoV-2 and other Sarbecovirus  currently known to infect humans.  If clinically indicated additional testing with an alternate test  methodology [phone]) is advised. The SARS-CoV-2 RNA is generally  detectable in upper and lower respiratory sp ecimens during the acute  phase of infection. The expected result is Negative. Fact Sheet for Patients:  StrictlyIdeas.no Fact Sheet for Healthcare Providers: BankingDealers.co.za This test is not yet approved or cleared by the [place] FDA and has been authorized for detection and/or diagnosis of SARS-CoV-2 by FDA under an Emergency Use Authorization (EUA).  This EUA will remain in effect (meaning this test can be used) for the duration of the COVID-19 declaration under Section 564(b)(1) of the Act, 21 U.S.C. section 360bbb-3(b)(1), unless the authorization is terminated or revoked sooner. Performed at [facility], [place] [address] [place], [place] [address]   MRSA PCR Screening     Status: Abnormal   Collection Time: 05/26/19  1:54 PM  Result Value Ref Range Status   MRSA by PCR POSITIVE (A) NEGATIVE Final    Comment:        The GeneXpert MRSA Assay (FDA approved for NASAL specimens only), is one component of a comprehensive MRSA colonization surveillance program. It is not intended to diagnose MRSA infection nor to guide or monitor treatment for MRSA infections. RESULT CALLED TO, READ BACK BY AND VERIFIED WITH: Sherlyn Lick RN 17:25 05/26/19 ([email or web address]) Performed at [facility], [place] [address], [place], [place] [address]      Radiology Studies: No results found.  Scheduled Meds: . calcium acetate  1,334 mg Oral TID WC  . Chlorhexidine Gluconate Cloth  6 each Topical Q0600  . dicyclomine  20 mg Oral TID AC  . feeding supplement (PRO-STAT SUGAR FREE 64)  30 mL Oral BID  . ferrous gluconate  324 mg Oral TID WC  . insulin aspart  0-9 Units Subcutaneous TID WC  . metoprolol tartrate  25 mg Oral Q  M,W,F-2000  . metoprolol tartrate  25 mg Oral 2 times per day on Sun Tue Thu Sat  . midodrine  10 mg Oral Q M,W,F-HD  . multivitamin with minerals  1 tablet Oral Daily  . mupirocin ointment  1 application Nasal BID  . pantoprazole  40 mg Oral Daily  .  vancomycin  500 mg Intravenous Q M,W,F-HD   Continuous Infusions: . ceFEPime (MAXIPIME) IV 2 g (05/27/19 0323)  . metronidazole 500 mg (05/28/19 0045)     LOS: 3 days    Time spent: 35 minutes.  More than 50% of the time has been in direct contact with the patient, coordinating care and discussing relevant ongoing clinical issues including sepsis and concern for infections in his right AKA.  Patient has continued to be responding appropriately to the use of IV antibiotics and supportive care.  He still receives continue to trend down, and there is no frank purulent discharge out of his right stoma this morning.  Vascular surgery has recommended to continue IV antibiotic therapy.  We will continue hemodialysis treatment as per nephrology service, and follow close monitoring of his vancomycin throat.   Barton Dubois, MD [facility] Pager [phone]  05/28/2019, 8:23 AM

## 2019-05-28 NOTE — Progress Notes (Addendum)
[facility] Progress Note   Subjective:  Seen in room - eating breakfast, says he's very hungry. No CP/dyspnea today.   Objective Vitals:   05/27/19 1421 05/27/19 2217 05/28/19 0504 05/28/19 0852  BP: (!) 111/55 (!) 120/59 119/70 111/64  Pulse:  92 90 95  Resp: 18 16 18 20   Temp:  99.4 F (37.4 C) 98.5 F (36.9 C) 98.3 F (36.8 C)  TempSrc:  Oral Oral Oral  SpO2:  100% 99% 100%  Weight:       Physical Exam General: Frail man, NAD. Awake/talkative. Heart: RRR; 3/6 systolic murmur Lungs: CTAB Abdomen: soft, non-tender Extremities: B AKAs - R AKA wrapped (not examined by me). Dialysis Access: LUE AVF + thrill  Additional Objective Labs: Basic Metabolic Panel: Recent Labs  Lab 05/26/19 0214 05/27/19 0348 05/28/19 0253  NA 140 138 137  K 4.2 3.8 3.7  CL 101 97* 102  CO2 21* 23 21*  GLUCOSE 127* 117* 151*  BUN 59* 51* 80*  CREATININE 4.98* 4.29* 6.12*  CALCIUM 9.5 9.3 9.3   Liver Function Tests: Recent Labs  Lab 05/26/19 0214 05/27/19 0348 05/28/19 0253  AST 49* 55* 31  ALT 44 55* 42  ALKPHOS 235* 272* 261*  BILITOT 0.7 0.8 0.6  PROT 6.8 7.0 6.5  ALBUMIN 2.4* 2.4* 2.2*   CBC: Recent Labs  Lab 05/25/19 1421 05/26/19 0214 05/27/19 0348 05/28/19 0253  WBC 17.5* 22.8* 17.6* 16.4*  NEUTROABS 14.2*  --   --   --   HGB 10.7* 8.6* 8.3* 7.8*  HCT 35.1* 28.3* 27.3* 25.5*  MCV 89.1 89.8 88.6 87.3  PLT 251 198 228 204   Blood Culture    Component Value Date/Time   SDES BLOOD SITE NOT SPECIFIED 05/25/2019 1445   SPECREQUEST  05/25/2019 1445    BOTTLES DRAWN AEROBIC ONLY Blood Culture adequate volume   CULT  05/25/2019 1445    NO GROWTH 2 DAYS Performed at [facility], [place] [address] [place], [place] [address]    REPTSTATUS PENDING 05/25/2019 1445   Medications: . ceFEPime (MAXIPIME) IV 2 g (05/27/19 0323)  . metronidazole 500 mg (05/28/19 0846)   . calcium acetate  1,334 mg Oral TID WC  . Chlorhexidine Gluconate Cloth  6  each Topical Q0600  . dicyclomine  20 mg Oral TID AC  . feeding supplement (PRO-STAT SUGAR FREE 64)  30 mL Oral BID  . ferrous gluconate  324 mg Oral TID WC  . insulin aspart  0-9 Units Subcutaneous TID WC  . metoprolol tartrate  25 mg Oral Q M,W,F-2000  . metoprolol tartrate  25 mg Oral 2 times per day on Sun Tue Thu Sat  . midodrine  10 mg Oral Q M,W,F-HD  . multivitamin with minerals  1 tablet Oral Daily  . mupirocin ointment  1 application Nasal BID  . pantoprazole  40 mg Oral Daily  . vancomycin  500 mg Intravenous Q M,W,F-HD    Outpatient Dialysis Orders: AF MWF  4h 400/1.5x EDW 53kg 3K/2.25Ca Na Linear UFP 2 L AVF No heparin  Mircera 150 q 2 weeks (last 5/25) No VDRA  Assessment/Plan: 1. Sepsis/Fever:Felt to be related to R stump. Femur xray without signs of osteo. WBC ^ - Started on empiric Vanc/Cefepime/Flagyl. BCx 5/28 negative so far. 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 VVS following - no plans for surgical intervention at present. RN note also indicates 2 sacral wounds - unable to examine. 2. AMS: Improving today,  likely related to #1. Dementia at baseline per notes. 3. ESRD: Continue HD per usualMWF schedule - next 6/1. 4. Chronic hypotension/volume: On midodrine. Admit CXR with some pulm edema, 2L off w/ last HD. 5. Anemia: Hgb 7.8. Follow trends. ESA recently dosed as outpatient.Transfuse prn. 6. Metabolic bone disease -Corr Ca ^. No VDRA. Will change binder if rises further. Follow trends. 7. Nutrition: Alb low, continue pro-stat 8. Afib: Takes warfarin as outpatient, on hold here.  Veneta Penton, PA-C 05/28/2019, 10:03 AM  [facility] Pager: [phone]  Pt seen, examined and agree w A/P as above.  Kelly Splinter  MD 05/28/2019, 11:13 AM

## 2019-05-28 NOTE — Progress Notes (Signed)
[facility]  Subjective  - No complaints.  WBC 22 --> 17 --> 16.   Objective 119/70 90 98.5 F (36.9 C) (Oral) 18 99%  Intake/Output Summary (Last 24 hours) at 05/28/2019 0729 Last data filed at 05/27/2019 1537 Gross per 24 hour  Intake 256.9 ml  Output -  Net 256.9 ml    R AKA stump with some granulation tissue and minimal drainage.  Laboratory Lab Results: Recent Labs    05/27/19 0348 05/28/19 0253  WBC 17.6* 16.4*  HGB 8.3* 7.8*  HCT 27.3* 25.5*  PLT 228 204   BMET Recent Labs    05/27/19 0348 05/28/19 0253  NA 138 137  K 3.8 3.7  CL 97* 102  CO2 23 21*  GLUCOSE 117* 151*  BUN 51* 80*  CREATININE 4.29* 6.12*  CALCIUM 9.3 9.3    COAG Lab Results  Component Value Date   INR 2.6 (H) 05/28/2019   INR 2.7 (H) 05/27/2019   INR 2.7 (H) 05/26/2019   No results found for: PTT  Assessment/Planning:  WBC continues to decrease down from 22 ---> 17 --> 16 on IV antibiotics.  Some granulation tissue around right AKA stump - no overt purulent drainage or tenderness.  Seems to be responding to IV antibiotics.    Marty Heck 05/28/2019 7:29 AM --

## 2019-05-28 NOTE — Progress Notes (Signed)
ANTICOAGULATION CONSULT NOTE  Pharmacy Consult : to start IV heparin when INR < 2  (no bolus);  Warfarin on hold. Indication: h/o atrial fibrillation   Assessment: 12 yom on warfarin PTA for afib presented 05/25/19 with confusion and fever. INR therapeutic at 2.4 on admit.  Warfarin discontinued 05/25/28 by MD for possible need for surgery (R AKA debridement).  Pharmacy consulted to start IV heparin infusion (no bolus) when INR <2. Noted DDI with Flagyl started 5/28-can increase warfarin effect.   Today 05/28/19  INR is 2.6, thus no need for IV heparin at this time.  Will follow up INR in AM. Warfarin last given on 5/28.  Hgb 10.7> 8.6>8.5>7.8, pltc wnl/stable. No bleeding reported.   Vascular surgery noted today,  some granulation tissue around right AKA stump - no overt purulent drainage or tenderness and that seems to be responding to IV antibiotics.  VVS continuing to follow.    PTA warfarin dose: 2.5 mg daily per last [place] home note 05/18/19 (last dose 5/27 PTA).  Goal of Therapy:  INR 2-3 Monitor platelets by anticoagulation protocol: Yes   Plan:  Will start IV heparin (no bolus) when INR < 2 and/or follow up if okay to restart warfarin if no surgery planned. This will day #3 of holding warfarin. Daily INR  Monitor CBC, s/sx bleeding, DDI  Thank you for allowing pharmacy to be part of this patients care team.  Nicole Cella, [place] Clinical Pharmacist Pager: [phone] Please check AMION for all [place] phone numbers After 10:00 PM, call [place] 05/28/2019 8:03 AM

## 2019-05-29 LAB — CBC
HCT: 20.4 % — ABNORMAL LOW (ref 39.0–52.0)
HCT: 21.8 % — ABNORMAL LOW (ref 39.0–52.0)
Hemoglobin: 6.2 g/dL — CL (ref 13.0–17.0)
Hemoglobin: 6.8 g/dL — CL (ref 13.0–17.0)
MCH: 26.5 pg (ref 26.0–34.0)
MCH: 26.9 pg (ref 26.0–34.0)
MCHC: 30.4 g/dL (ref 30.0–36.0)
MCHC: 31.2 g/dL (ref 30.0–36.0)
MCV: 87.2 fL (ref 80.0–100.0)
MCV: 86.2 fL (ref 80.0–100.0)
Platelets: 188 10*3/uL (ref 150–400)
Platelets: 206 10*3/uL (ref 150–400)
RBC: 2.34 MIL/uL — ABNORMAL LOW (ref 4.22–5.81)
RBC: 2.53 MIL/uL — ABNORMAL LOW (ref 4.22–5.81)
RDW: 19.5 % — ABNORMAL HIGH (ref 11.5–15.5)
RDW: 19.5 % — ABNORMAL HIGH (ref 11.5–15.5)
WBC: 16.6 10*3/uL — ABNORMAL HIGH (ref 4.0–10.5)
WBC: 15.8 10*3/uL — ABNORMAL HIGH (ref 4.0–10.5)
nRBC: 0 % (ref 0.0–0.2)
nRBC: 0 % (ref 0.0–0.2)

## 2019-05-29 LAB — GLUCOSE, CAPILLARY
Glucose-Capillary: 111 mg/dL — ABNORMAL HIGH (ref 70–99)
Glucose-Capillary: 122 mg/dL — ABNORMAL HIGH (ref 70–99)
Glucose-Capillary: 108 mg/dL — ABNORMAL HIGH (ref 70–99)
Glucose-Capillary: 101 mg/dL — ABNORMAL HIGH (ref 70–99)

## 2019-05-29 LAB — PROTIME-INR
INR: 2.4 — ABNORMAL HIGH (ref 0.8–1.2)
INR: 2.3 — ABNORMAL HIGH (ref 0.8–1.2)
Prothrombin Time: 25.7 seconds — ABNORMAL HIGH (ref 11.4–15.2)
Prothrombin Time: 25 seconds — ABNORMAL HIGH (ref 11.4–15.2)

## 2019-05-29 LAB — MAGNESIUM: Magnesium: 2.1 mg/dL (ref 1.7–2.4)

## 2019-05-29 LAB — OCCULT BLOOD X 1 CARD TO LAB, STOOL: Fecal Occult Bld: POSITIVE — AB

## 2019-05-29 MED ORDER — WARFARIN - PHARMACIST DOSING INPATIENT
Freq: Every day | Status: DC
  Administered 2019-05-30: 19:00:00

## 2019-05-29 MED ORDER — RENA-VITE PO TABS
1.0000 | ORAL_TABLET | Freq: Every day | ORAL | Status: DC
  Administered 2019-05-30: 21:00:00 1 via ORAL
  Filled 2019-05-29: qty 1

## 2019-05-29 MED ORDER — SODIUM CHLORIDE 0.9% IV SOLUTION: Freq: Once | INTRAVENOUS | Status: DC

## 2019-05-29 MED ORDER — NEPRO/CARBSTEADY PO LIQD
237.0000 mL | Freq: Two times a day (BID) | ORAL | Status: DC
  Administered 2019-05-29 – 2019-05-31 (×3): 237 mL via ORAL

## 2019-05-29 MED ORDER — WARFARIN SODIUM 1 MG PO TABS
1.0000 mg | ORAL_TABLET | Freq: Once | ORAL | Status: AC
  Administered 2019-05-29: 1 mg via ORAL
  Filled 2019-05-29: qty 1

## 2019-05-29 MED ORDER — MIDODRINE HCL 5 MG PO TABS
10.0000 mg | ORAL_TABLET | ORAL | Status: AC
  Administered 2019-05-30: 10 mg via ORAL

## 2019-05-29 MED ORDER — VANCOMYCIN HCL IN DEXTROSE 500-5 MG/100ML-% IV SOLN
500.0000 mg | INTRAVENOUS | Status: AC
  Administered 2019-05-30: 500 mg via INTRAVENOUS
  Filled 2019-05-29 (×2): qty 100

## 2019-05-29 MED ORDER — SODIUM CHLORIDE 0.9% IV SOLUTION
Freq: Once | INTRAVENOUS | Status: AC
  Administered 2019-05-30: 02:00:00 via INTRAVENOUS

## 2019-05-29 NOTE — Progress Notes (Signed)
PROGRESS NOTE    [name]  UKG:[id number] DOB: Jun 19, 1941 DOA: 05/25/2019 PCP: Hennie Duos, MD     Brief Narrative:  78 y.o. 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  Apparently home health nurse had called from abdomen forearms and vascular and vein specialist office with concerns of some drainage from the right above-the-knee amputation therefore vascular clinic had advised by home nurse to send the patient to the ED for further evaluation.  In the ER patient was noted to be slightly confused with labs suggestive of lactic acidosis, transaminitis and leukocytosis.  Concerns for sepsis.  Chest x-ray showed slight cardiac vascular congestion.  Was evaluated by vascular surgery who recommended medical management for infectious etiology with no acute indication for surgical intervention.  Patient was admitted to medicine for further intervention.   Assessment & Plan: 1-Sepsis ([place]) -With concern for infection of right AKA stump -Sepsis features appropriately resolving/resolved -Continue IV antibiotics; patient responding appropriately  -Follow-up vascular surgery recommendations -Patient afebrile, WBCs trending down -Continue dialysis therapy as per nephrology service. -Will provide pain medications, but will be judicious with increased sedation. -X-ray of the femoral demonstrated no erosive changes suggestive of osteomyelitis. -Vascular surgery to decide if noncontrast CT of right AKA needed to rule out any underlying fluid collection, abscess or need of any surgical revision. -Follow culture data.  2-Type 2 diabetes mellitus with hypertension and end stage renal disease on dialysis ([place]) -Continue sliding scale insulin -Follow CBGs and  adjust hypoglycemic regimen as needed.  3-HYPERTENSION, BENIGN -Blood pressure is soft and stable -Continue management with dialysis. -Will continue to monitor vital signs. -Continue metoprolol  4-PAF (paroxysmal atrial fibrillation) ([facility]) -Rate controlled currently -Will discontinue telemetry  -INR therapeutic -since no surgical intervention anticipated, will resume coumadin perpharmacy  5-anabolic encephalopathy -Patient with underlying history of dementia.  Records reviewed. -Continue to avoid to minimize the use of sedative agents -Constant reorientation -Continue treatment for sepsis.  6-ESRD (end stage renal disease) ([place]) -Continue dialysis therapy as per nephrology service. -Patient on Monday, Wednesday and Friday schedule.  7-secondary hyperparathyroidism -Will continue the use of PhosLo  8-anemia of chronic kidney disease -Continue the use of iron supplementation and Epogen therapy as per nephrology discretion. -Hemoglobin down today -Will receive PRBC transfusion during hemodialysis.   DVT prophylaxis: Chronically on Coumadin; likely transition to heparin in case surgical procedures to be done. Code Status: Full code Family Communication: No family at bedside. Disposition Plan: Continue IV antibiotics, nephrology has been consulted and will pursue dialysis treatment; follow clinical response and follow recommendations by vascular surgery.  Consultants:   Neurosurgery  Nephrology  Procedures:   See below for x-ray reports  Antimicrobials:  Anti-infectives (From admission, onward)   Start     Dose/Rate Route Frequency Ordered Stop   05/26/19 2200  ceFEPIme (MAXIPIME) 2 g in sodium chloride 0.9 % 100 mL IVPB     2 g 200 mL/hr over 30 Minutes Intravenous Every M-W-F 05/26/19 1439     05/26/19 1930  vancomycin (VANCOCIN) IVPB 500 mg/100 ml premix     500 mg 100 mL/hr over 60 Minutes Intravenous Every M-W-F (Hemodialysis) 05/26/19 1439     05/25/19 1745   ceFEPIme (MAXIPIME) 2 g in sodium chloride 0.9 % 100 mL IVPB  Status:  Discontinued     2  g 200 mL/hr over 30 Minutes Intravenous  Once 05/25/19 1739 05/25/19 1743   05/25/19 1745  metroNIDAZOLE (FLAGYL) IVPB 500 mg     500 mg 100 mL/hr over 60 Minutes Intravenous Every 8 hours 05/25/19 1739     05/25/19 1745  vancomycin (VANCOCIN) IVPB 1000 mg/200 mL premix  Status:  Discontinued     1,000 mg 200 mL/hr over 60 Minutes Intravenous  Once 05/25/19 1739 05/25/19 1743   05/25/19 1445  vancomycin variable dose per unstable renal function (pharmacist dosing)  Status:  Discontinued      Does not apply See admin instructions 05/25/19 1445 05/26/19 1439   05/25/19 1445  ceFAZolin (ANCEF) IVPB 2g/100 mL premix  Status:  Discontinued     2 g 200 mL/hr over 30 Minutes Intravenous  Once 05/25/19 1435 05/25/19 1437   05/25/19 1445  ceFEPIme (MAXIPIME) 2 g in sodium chloride 0.9 % 100 mL IVPB     2 g 200 mL/hr over 30 Minutes Intravenous  Once 05/25/19 1437 05/25/19 1623   05/25/19 1445  vancomycin (VANCOCIN) 1,250 mg in sodium chloride 0.9 % 250 mL IVPB     1,250 mg 166.7 mL/hr over 90 Minutes Intravenous  Once 05/25/19 1444 05/25/19 1813      Subjective: No fever, no chest pain, no nausea, no vomiting, no shortness of breath.  Patient without acute complaints.  Objective: Vitals:   05/28/19 0852 05/28/19 1633 05/28/19 2058 05/29/19 0515  BP: 111/64 110/63 112/67 106/65  Pulse: 95 88 92 (!) 101  Resp: 20  18 18   Temp: 98.3 F (36.8 C) 98.2 F (36.8 C) 97.6 F (36.4 C) 98.6 F (37 C)  TempSrc: Oral Oral Oral Oral  SpO2: 100% 94% 100% 95%  Weight:    55.4 kg    Intake/Output Summary (Last 24 hours) at 05/29/2019 1345 Last data filed at 05/29/2019 1100 Gross per 24 hour  Intake 200 ml  Output -  Net 200 ml   Filed Weights   05/27/19 0324 05/27/19 0608 05/29/19 0515  Weight: 52.3 kg 52.8 kg 55.4 kg    Examination: General exam: Alert, awake, oriented x 2; patient now acute distress  denying any complaints currently.  No chest pain or shortness of breath, no nausea, no vomiting.  Afebrile Respiratory system: Clear to auscultation. Respiratory effort normal. Cardiovascular system:Rate controlled. No murmurs, rubs, gallops. Gastrointestinal system: Abdomen is nondistended, soft and nontender. No organomegaly or masses felt. Normal bowel sounds heard. Central nervous system: Alert and oriented. No focal neurological deficits. Extremities: Positive left BKA; positive for right AKA.  No active drainage appreciated today on his right stump.  Patient reports very little pain on palpation only. Skin: 2 stage II decubitus sacral ulcer recurred present at time of admission.  No signs of superimposed infection appreciated on them.  No active drainage seen. Psychiatry: Judgement and insight appear normal. Mood & affect appropriate.    Data Reviewed: I have personally reviewed following labs and imaging studies  CBC: Recent Labs  Lab 05/25/19 1421 05/26/19 0214 05/27/19 0348 05/28/19 0253 05/29/19 0541  WBC 17.5* 22.8* 17.6* 16.4* 16.6*  NEUTROABS 14.2*  --   --   --   --   HGB 10.7* 8.6* 8.3* 7.8* 6.2*  HCT 35.1* 28.3* 27.3* 25.5* 20.4*  MCV 89.1 89.8 88.6 87.3 87.2  PLT 251 198 228 204 811   Basic Metabolic Panel: Recent Labs  Lab 05/25/19 1421 05/26/19 0214 05/27/19 0348 05/28/19 0253 05/29/19 0541  NA 138 140  138 137  --   K 4.8 4.2 3.8 3.7  --   CL 95* 101 97* 102  --   CO2 26 21* 23 21*  --   GLUCOSE 130* 127* 117* 151*  --   BUN 50* 59* 51* 80*  --   CREATININE 4.76* 4.98* 4.29* 6.12*  --   CALCIUM 10.4* 9.5 9.3 9.3  --   MG  --  1.9 1.8 2.0 2.1   GFR: Estimated Creatinine Clearance: 7.8 mL/min (A) (by C-G formula based on SCr of 6.12 mg/dL (H)).   Liver Function Tests: Recent Labs  Lab 05/25/19 1421 05/26/19 0214 05/27/19 0348 05/28/19 0253  AST 77* 49* 55* 31  ALT 58* 44 55* 42  ALKPHOS 288* 235* 272* 261*  BILITOT 1.2 0.7 0.8 0.6  PROT 8.2*  6.8 7.0 6.5  ALBUMIN 2.9* 2.4* 2.4* 2.2*   Coagulation Profile: Recent Labs  Lab 05/25/19 1900 05/26/19 0214 05/27/19 0651 05/28/19 0253 05/29/19 0541  INR 2.4* 2.7* 2.7* 2.6* 2.4*   CBG: Recent Labs  Lab 05/28/19 1203 05/28/19 1635 05/28/19 2101 05/29/19 0814 05/29/19 1154  GLUCAP 131* 127* 151* 111* 122*   Urine analysis:    Component Value Date/Time   COLORURINE YELLOW 10/20/2008 [place] 10/20/2008 0942   LABSPEC 1.011 10/20/2008 0942   PHURINE 6.0 10/20/2008 0942   GLUCOSEU NEGATIVE 10/20/2008 0942   HGBUR LARGE (A) 10/20/2008 0942   BILIRUBINUR NEGATIVE 10/20/2008 0942   KETONESUR NEGATIVE 10/20/2008 0942   PROTEINUR 100 (A) 10/20/2008 0942   UROBILINOGEN 1.0 10/20/2008 0942   NITRITE NEGATIVE 10/20/2008 0942   LEUKOCYTESUR SMALL (A) 10/20/2008 0942    Recent Results (from the past 240 hour(s))  Blood Culture (routine x 2)     Status: None (Preliminary result)   Collection Time: 05/25/19  2:24 PM  Result Value Ref Range Status   Specimen Description BLOOD RIGHT ANTECUBITAL  Final   Special Requests   Final    BOTTLES DRAWN AEROBIC AND ANAEROBIC Blood Culture adequate volume   Culture   Final    NO GROWTH 4 DAYS Performed at [facility], [place] [address], [place], [place] [address]    Report Status PENDING  Incomplete  Blood Culture (routine x 2)     Status: None (Preliminary result)   Collection Time: 05/25/19  2:45 PM  Result Value Ref Range Status   Specimen Description BLOOD SITE NOT SPECIFIED  Final   Special Requests   Final    BOTTLES DRAWN AEROBIC ONLY Blood Culture adequate volume   Culture   Final    NO GROWTH 4 DAYS Performed at [facility], [address], [place], [place] [address]    Report Status PENDING  Incomplete  SARS Coronavirus 2 (CEPHEID - Performed in [facility] lab), Hosp Order     Status: None   Collection Time: 05/25/19  3:03 PM  Result Value Ref Range Status   SARS Coronavirus 2  NEGATIVE NEGATIVE Final    Comment: (NOTE) If result is NEGATIVE SARS-CoV-2 target nucleic acids are NOT DETECTED. The SARS-CoV-2 RNA is generally detectable in upper and lower  respiratory specimens during the acute phase of infection. The lowest  concentration of SARS-CoV-2 viral copies this assay can detect is 250  copies / mL. A negative result does not preclude SARS-CoV-2 infection  and should not be used as the sole basis for treatment or other  patient management decisions.  A negative result  may occur with  improper specimen collection / handling, submission of specimen other  than nasopharyngeal swab, presence of viral mutation(s) within the  areas targeted by this assay, and inadequate number of viral copies  (<250 copies / mL). A negative result must be combined with clinical  observations, patient history, and epidemiological information. If result is POSITIVE SARS-CoV-2 target nucleic acids are DETECTED. The SARS-CoV-2 RNA is generally detectable in upper and lower  respiratory specimens dur ing the acute phase of infection.  Positive  results are indicative of active infection with SARS-CoV-2.  Clinical  correlation with patient history and other diagnostic information is  necessary to determine patient infection status.  Positive results do  not rule out bacterial infection or co-infection with other viruses. If result is PRESUMPTIVE POSTIVE SARS-CoV-2 nucleic acids MAY BE PRESENT.   A presumptive positive result was obtained on the submitted specimen  and confirmed on repeat testing.  While 2019 novel coronavirus  (SARS-CoV-2) nucleic acids may be present in the submitted sample  additional confirmatory testing may be necessary for epidemiological  and / or clinical management purposes  to differentiate between  SARS-CoV-2 and other Sarbecovirus currently known to infect humans.  If clinically indicated additional testing with an alternate test  methodology [phone])  is advised. The SARS-CoV-2 RNA is generally  detectable in upper and lower respiratory sp ecimens during the acute  phase of infection. The expected result is Negative. Fact Sheet for Patients:  StrictlyIdeas.no Fact Sheet for Healthcare Providers: BankingDealers.co.za This test is not yet approved or cleared by the [place] FDA and has been authorized for detection and/or diagnosis of SARS-CoV-2 by FDA under an Emergency Use Authorization (EUA).  This EUA will remain in effect (meaning this test can be used) for the duration of the COVID-19 declaration under Section 564(b)(1) of the Act, 21 U.S.C. section 360bbb-3(b)(1), unless the authorization is terminated or revoked sooner. Performed at [facility], [place] [address] [place], [place] [address]   MRSA PCR Screening     Status: Abnormal   Collection Time: 05/26/19  1:54 PM  Result Value Ref Range Status   MRSA by PCR POSITIVE (A) NEGATIVE Final    Comment:        The GeneXpert MRSA Assay (FDA approved for NASAL specimens only), is one component of a comprehensive MRSA colonization surveillance program. It is not intended to diagnose MRSA infection nor to guide or monitor treatment for MRSA infections. RESULT CALLED TO, READ BACK BY AND VERIFIED WITH: Sherlyn Lick RN 17:25 05/26/19 ([email or web address]) Performed at [facility], [place] [address], [place], [place] [address]      Radiology Studies: No results found.  Scheduled Meds: . calcium acetate  1,334 mg Oral TID WC  . Chlorhexidine Gluconate Cloth  6 each Topical Q0600  . dicyclomine  20 mg Oral TID AC  . feeding supplement (NEPRO CARB STEADY)  237 mL Oral BID BM  . feeding supplement (PRO-STAT SUGAR FREE 64)  30 mL Oral BID  . insulin aspart  0-9 Units Subcutaneous TID WC  . metoprolol tartrate  25 mg Oral Q M,W,F-2000  . metoprolol tartrate  25 mg Oral 2 times per day on Sun Tue Thu Sat  . midodrine  10 mg Oral Q  M,W,F-HD  . [START ON 05/30/2019] multivitamin  1 tablet Oral QHS  . mupirocin ointment  1 application Nasal BID  . pantoprazole  40 mg Oral Daily  . vancomycin  500 mg Intravenous  Q M,W,F-HD   Continuous Infusions: . ceFEPime (MAXIPIME) IV 2 g (05/27/19 0323)  . metronidazole 500 mg (05/29/19 0917)     LOS: 4 days    Time spent: 35 minutes.  More than 50% of the time has been in direct contact with the patient, coordinating care and discussing relevant ongoing clinical issues.  Case has been discussed with nephrologist and nursing staff has been updated.  Patient sepsis features essentially resolving/resolved with IV antibiotics.  There is no plan for surgical intervention currently.  Continue hemodialysis management and plan for blood transfusion during dialysis later today.  Patient may end requiring 2 weeks of IV antibiotics at discharge (given with HD).   Barton Dubois, MD [facility] Pager [phone]  05/29/2019, 1:45 PM

## 2019-05-29 NOTE — Progress Notes (Signed)
ANTICOAGULATION CONSULT NOTE - Follow Up Consult  Pharmacy Consult for Coumadin Indication: atrial fibrillation  Allergies  Allergen Reactions  . Occlusive Silicone Sheets [Silicone] Other (See Comments)    "Allergic," per MAR  . Other Other (See Comments)    Unknown reaction to Occlusive adhesive- "Allergic," per MAR  . Tape Itching and Other (See Comments)    Use Cloth tape only, please    Patient Measurements: Height: 5\' 6"  (167.6 cm) Weight: 122 lb 2.2 oz (55.4 kg) IBW/kg (Calculated) : 63.8  Vital Signs: Temp: 98.6 F (37 C) (06/01 0515) Temp Source: Oral (06/01 0515) BP: 106/65 (06/01 0515) Pulse Rate: 101 (06/01 0515)  Labs: Recent Labs    05/27/19 0348 05/27/19 0651 05/28/19 0253 05/29/19 0541  HGB 8.3*  --  7.8* 6.2*  HCT 27.3*  --  25.5* 20.4*  PLT 228  --  204 188  LABPROT  --  28.4* 27.7* 25.7*  INR  --  2.7* 2.6* 2.4*  CREATININE 4.29*  --  6.12*  --     Estimated Creatinine Clearance: 7.8 mL/min (A) (by C-G formula based on SCr of 6.12 mg/dL (H)).  Assessment:  78 yo male on warfarin PTA for afib presented 05/25/19 with confusion and fever. INR therapeutic at 2.4 on admit.  Warfarin was held for possible need for surgery (debridement). Pharmacy had been consulted to start IV heparin infusion (no bolus) when INR <2, but no heparin given due to INR remains therapeutic. Wound noted improved and no surgery is currently planned.  INR trended up to 2.7 on 5/30, now back down to 2.4 today.  Drug-drug interaction with Metronidazole, which is likely contributing to slow decline in INR.    Home Coumadin regimen:  2.5 mg daily.  Last dose 5/28  Goal of Therapy:  INR 2-3 Monitor platelets by anticoagulation protocol: Yes   Plan:   Resume Warfarin with 1 mg x 1 today.  Expecting lower doses than PTA while on Metronidazole.  Daily PT/INR.  Arty Baumgartner, [place] Pager: [phone] or phone: [phone] 05/29/2019,3:07 PM

## 2019-05-29 NOTE — Progress Notes (Signed)
Patient states "NO" when informed that IV team is here to start IV.  When asked again and informed that he needs it for treatment, patient states "NO".  Junie Panning, RN updated and will re-enter consult if patient becomes willing.  Carolee Rota, RN VAST

## 2019-05-29 NOTE — Progress Notes (Signed)
[facility] Progress Note   Subjective:  Seen in room -trying to eat breakfast - needed help with cutting up food.  Objective Vitals:   05/28/19 0852 05/28/19 1633 05/28/19 2058 05/29/19 0515  BP: 111/64 110/63 112/67 106/65  Pulse: 95 88 92 (!) 101  Resp: 20  18 18   Temp: 98.3 F (36.8 C) 98.2 F (36.8 C) 97.6 F (36.4 C) 98.6 F (37 C)  TempSrc: Oral Oral Oral Oral  SpO2: 100% 94% 100% 95%  Weight:    55.4 kg   Physical Exam General: Frail man, NAD. Awake/talkative. Heart: RRR; 3/6 systolic murmur Lungs: CTAB Abdomen: soft, non-tender Extremities: B AKAs - R AKA  Medial dressing no edema. Dialysis Access: LUE AVF + thrill  Additional Objective Labs: Basic Metabolic Panel: Recent Labs  Lab 05/26/19 0214 05/27/19 0348 05/28/19 0253  NA 140 138 137  K 4.2 3.8 3.7  CL 101 97* 102  CO2 21* 23 21*  GLUCOSE 127* 117* 151*  BUN 59* 51* 80*  CREATININE 4.98* 4.29* 6.12*  CALCIUM 9.5 9.3 9.3   Liver Function Tests: Recent Labs  Lab 05/26/19 0214 05/27/19 0348 05/28/19 0253  AST 49* 55* 31  ALT 44 55* 42  ALKPHOS 235* 272* 261*  BILITOT 0.7 0.8 0.6  PROT 6.8 7.0 6.5  ALBUMIN 2.4* 2.4* 2.2*   CBC: Recent Labs  Lab 05/25/19 1421 05/26/19 0214 05/27/19 0348 05/28/19 0253 05/29/19 0541  WBC 17.5* 22.8* 17.6* 16.4* 16.6*  NEUTROABS 14.2*  --   --   --   --   HGB 10.7* 8.6* 8.3* 7.8* 6.2*  HCT 35.1* 28.3* 27.3* 25.5* 20.4*  MCV 89.1 89.8 88.6 87.3 87.2  PLT 251 198 228 204 188   Blood Culture    Component Value Date/Time   SDES BLOOD SITE NOT SPECIFIED 05/25/2019 1445   SPECREQUEST  05/25/2019 1445    BOTTLES DRAWN AEROBIC ONLY Blood Culture adequate volume   CULT  05/25/2019 1445    NO GROWTH 4 DAYS Performed at [facility], [place] [address], [place], [place] [address]    REPTSTATUS PENDING 05/25/2019 1445   Medications: . ceFEPime (MAXIPIME) IV 2 g (05/27/19 0323)  . metronidazole 500 mg (05/29/19 0145)   . calcium  acetate  1,334 mg Oral TID WC  . Chlorhexidine Gluconate Cloth  6 each Topical Q0600  . dicyclomine  20 mg Oral TID AC  . feeding supplement (PRO-STAT SUGAR FREE 64)  30 mL Oral BID  . ferrous gluconate  324 mg Oral TID WC  . insulin aspart  0-9 Units Subcutaneous TID WC  . metoprolol tartrate  25 mg Oral Q M,W,F-2000  . metoprolol tartrate  25 mg Oral 2 times per day on Sun Tue Thu Sat  . midodrine  10 mg Oral Q M,W,F-HD  . multivitamin with minerals  1 tablet Oral Daily  . mupirocin ointment  1 application Nasal BID  . pantoprazole  40 mg Oral Daily  . vancomycin  500 mg Intravenous Q M,W,F-HD    Outpatient Dialysis Orders: AF MWF  4h 400/1.5x EDW 53kg 3K/2.25Ca Na Linear UFP 2 L AVF No heparin  Mircera 150 q 2 weeks (last 5/25) No VDRA  Assessment/Plan: 1. Sepsis/Fever:Felt to be related to R stump. Femur xray without signs of osteo. WBC ^ - Started on empiric Vanc/Cefepime/Flagyl. BCx 5/28 negative so far. 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 VVS following - no plans for surgical intervention at present. RN note  also indicates 2 sacral wounds  2. AMS: Improving today, likely related to #1. Dementia at baseline per notes. 3. ESRD: Continue HD per usualMWF schedule - next 6/1. 4. Chronic hypotension/volume: On midodrine. Admit CXR with some pulm edema, 2L off w/ last HD.- getting a little below edw as outpatient with low gains - EDW likely needs to be lwoered  5. Anemia: Hgb 7.8 > 6.2 . ? ESA recently dosed as outpatient.repeat H/H on HD Transfuse 1 unit PRBC on HD if still low - no obvious reason for acute drop - check FOBT- discussed plan with RN - last tsat was 15% 5/20- hold on repletion for now ferritin 1445 likely inflammatory 6. Metabolic bone disease -Corr Ca ^. No VDRA. Will change binder if rises further. Follow trends. 7. Nutrition: Alb low, continue pro-stat, add nepro - 8. Afib: Takes warfarin as outpatient, on hold here.on BB  Amalia Hailey, PA-C 05/29/2019,  9:06 AM  [facility] Pager: [phone]

## 2019-05-29 NOTE — Progress Notes (Addendum)
  Progress Note    05/29/2019 7:48 AM  Subjective:  No new complaints   Vitals:   05/28/19 2058 05/29/19 0515  BP: 112/67 106/65  Pulse: 92 (!) 101  Resp: 18 18  Temp: 97.6 F (36.4 C) 98.6 F (37 C)  SpO2: 100% 95%   Physical Exam: Lungs:  Non labored Incisions:  L AKA incision well healed; R AKA incision with medial portion in question, drainage resolved, no tenderness to manipulation Neurologic: baseline  CBC    Component Value Date/Time   WBC 16.6 (H) 05/29/2019 0541   RBC 2.34 (L) 05/29/2019 0541   HGB 6.2 (LL) 05/29/2019 0541   HCT 20.4 (L) 05/29/2019 0541   HCT 27.1 (L) 11/09/2018 0230   PLT 188 05/29/2019 0541   MCV 87.2 05/29/2019 0541   MCH 26.5 05/29/2019 0541   MCHC 30.4 05/29/2019 0541   RDW 19.5 (H) 05/29/2019 0541   LYMPHSABS 1.2 05/25/2019 1421   MONOABS 1.9 (H) 05/25/2019 1421   EOSABS 0.1 05/25/2019 1421   BASOSABS 0.1 05/25/2019 1421    BMET    Component Value Date/Time   NA 137 05/28/2019 0253   NA 141 02/27/2019   K 3.7 05/28/2019 0253   CL 102 05/28/2019 0253   CO2 21 (L) 05/28/2019 0253   GLUCOSE 151 (H) 05/28/2019 0253   BUN 80 (H) 05/28/2019 0253   BUN 64 (A) 02/27/2019   CREATININE 6.12 (H) 05/28/2019 0253   CALCIUM 9.3 05/28/2019 0253   CALCIUM 8.3 (L) 10/25/2008 1450   GFRNONAA 8 (L) 05/28/2019 0253   GFRAA 9 (L) 05/28/2019 0253    INR    Component Value Date/Time   INR 2.4 (H) 05/29/2019 0541    No intake or output data in the 24 hours ending 05/29/19 0748   Assessment/Plan:  78 y.o. male with slow to heal R AKA wound  Responding to IV antibiotics over the weekend; WBC now stable at 16 Drainage resolved from R AKA; no purulence or tenderness No obvious indication for surgical intervention Hgb 6.2; defer to Nephrology to correct with HD treatment today   Dagoberto Ligas, PA-C [facility] [phone] 05/29/2019 7:48 AM  I have independently interviewed and examined patient and agree with PA  assessment and plan above.  No discernible drainage or fluctuance today.  Leukocytosis is stable.  No vascular intervention at this time.  Zian Delair C. Donzetta Matters, MD [facility] [place] Office: [phone] Pager: [phone]

## 2019-05-30 LAB — CBC
HCT: 23.1 % — ABNORMAL LOW (ref 39.0–52.0)
Hemoglobin: 7.4 g/dL — ABNORMAL LOW (ref 13.0–17.0)
MCH: 27.5 pg (ref 26.0–34.0)
MCHC: 32 g/dL (ref 30.0–36.0)
MCV: 85.9 fL (ref 80.0–100.0)
Platelets: 192 10*3/uL (ref 150–400)
RBC: 2.69 MIL/uL — ABNORMAL LOW (ref 4.22–5.81)
RDW: 18.2 % — ABNORMAL HIGH (ref 11.5–15.5)
WBC: 14.6 10*3/uL — ABNORMAL HIGH (ref 4.0–10.5)
nRBC: 0 % (ref 0.0–0.2)

## 2019-05-30 LAB — CULTURE, BLOOD (ROUTINE X 2)
Culture: NO GROWTH
Culture: NO GROWTH
Special Requests: ADEQUATE
Special Requests: ADEQUATE

## 2019-05-30 LAB — GLUCOSE, CAPILLARY
Glucose-Capillary: 92 mg/dL (ref 70–99)
Glucose-Capillary: 84 mg/dL (ref 70–99)
Glucose-Capillary: 81 mg/dL (ref 70–99)
Glucose-Capillary: 83 mg/dL (ref 70–99)

## 2019-05-30 LAB — RENAL FUNCTION PANEL
Albumin: 2.3 g/dL — ABNORMAL LOW (ref 3.5–5.0)
Anion gap: 19 — ABNORMAL HIGH (ref 5–15)
BUN: 158 mg/dL — ABNORMAL HIGH (ref 8–23)
CO2: 17 mmol/L — ABNORMAL LOW (ref 22–32)
Calcium: 10 mg/dL (ref 8.9–10.3)
Chloride: 101 mmol/L (ref 98–111)
Creatinine, Ser: 8.32 mg/dL — ABNORMAL HIGH (ref 0.61–1.24)
GFR calc Af Amer: 6 mL/min — ABNORMAL LOW (ref >60)
GFR calc non Af Amer: 6 mL/min — ABNORMAL LOW (ref >60)
Glucose, Bld: 99 mg/dL (ref 70–99)
Phosphorus: 2.6 mg/dL (ref 2.5–4.6)
Potassium: 5.1 mmol/L (ref 3.5–5.1)
Sodium: 137 mmol/L (ref 135–145)

## 2019-05-30 LAB — PREPARE RBC (CROSSMATCH)

## 2019-05-30 LAB — PROTIME-INR
INR: 2.2 — ABNORMAL HIGH (ref 0.8–1.2)
Prothrombin Time: 24.5 seconds — ABNORMAL HIGH (ref 11.4–15.2)

## 2019-05-30 LAB — MAGNESIUM: Magnesium: 2.4 mg/dL (ref 1.7–2.4)

## 2019-05-30 MED ORDER — WARFARIN SODIUM 2 MG PO TABS
2.0000 mg | ORAL_TABLET | Freq: Once | ORAL | Status: AC
  Administered 2019-05-30: 2 mg via ORAL
  Filled 2019-05-30: qty 1

## 2019-05-30 MED ORDER — CEFAZOLIN SODIUM-DEXTROSE 2-4 GM/100ML-% IV SOLN
2.0000 g | INTRAVENOUS | Status: DC
  Filled 2019-05-30: qty 100

## 2019-05-30 MED ORDER — LIDOCAINE HCL (PF) 1 % IJ SOLN: 5.0000 mL | INTRAMUSCULAR | Status: DC | PRN

## 2019-05-30 MED ORDER — MIDODRINE HCL 5 MG PO TABS
ORAL_TABLET | ORAL | Status: AC
  Filled 2019-05-30: qty 2

## 2019-05-30 MED ORDER — HEPARIN SODIUM (PORCINE) 1000 UNIT/ML DIALYSIS: 1000.0000 [IU] | INTRAMUSCULAR | Status: DC | PRN

## 2019-05-30 MED ORDER — LIDOCAINE-PRILOCAINE 2.5-2.5 % EX CREA: 1.0000 "application " | TOPICAL_CREAM | CUTANEOUS | Status: DC | PRN

## 2019-05-30 MED ORDER — SODIUM CHLORIDE 0.9 % IV SOLN: 100.0000 mL | INTRAVENOUS | Status: DC | PRN

## 2019-05-30 MED ORDER — CEFAZOLIN SODIUM-DEXTROSE 1-4 GM/50ML-% IV SOLN: 1.0000 g | INTRAVENOUS | Status: DC

## 2019-05-30 MED ORDER — VANCOMYCIN HCL IN DEXTROSE 500-5 MG/100ML-% IV SOLN
INTRAVENOUS | Status: AC
  Filled 2019-05-30: qty 100

## 2019-05-30 MED ORDER — ALTEPLASE 2 MG IJ SOLR: 2.0000 mg | Freq: Once | INTRAMUSCULAR | Status: DC | PRN

## 2019-05-30 MED ORDER — PENTAFLUOROPROP-TETRAFLUOROETH EX AERO: 1.0000 "application " | INHALATION_SPRAY | CUTANEOUS | Status: DC | PRN

## 2019-05-30 NOTE — Progress Notes (Signed)
[facility] Progress Note   Subjective:  Seen in room on HD no c/o.  Not as engaging as yesterday.  Objective Vitals:   05/30/19 0119 05/30/19 0152 05/30/19 0427 05/30/19 0546  BP: 102/64 112/63 125/81   Pulse: 96 88 74   Resp: 16 18 18    Temp: (!) 97.1 F (36.2 C) (!) 97.3 F (36.3 C) 97.8 F (36.6 C)   TempSrc: Oral  Axillary   SpO2: 95% 94% 100%   Weight:    56.2 kg  Height:       Physical Exam goal 2.5 -  General: Frail man, NAD. Awake Heart: RRR; 3/6 systolic murmur Lungs: CTAB Abdomen: soft, non-tender Extremities: B AKAs - R AKA  Medial dressing no edema. Dialysis Access: LUE AVF + thrill Qb 350 Additional Objective Labs: Basic Metabolic Panel: Recent Labs  Lab 05/26/19 0214 05/27/19 0348 05/28/19 0253  NA 140 138 137  K 4.2 3.8 3.7  CL 101 97* 102  CO2 21* 23 21*  GLUCOSE 127* 117* 151*  BUN 59* 51* 80*  CREATININE 4.98* 4.29* 6.12*  CALCIUM 9.5 9.3 9.3   Liver Function Tests: Recent Labs  Lab 05/26/19 0214 05/27/19 0348 05/28/19 0253  AST 49* 55* 31  ALT 44 55* 42  ALKPHOS 235* 272* 261*  BILITOT 0.7 0.8 0.6  PROT 6.8 7.0 6.5  ALBUMIN 2.4* 2.4* 2.2*   CBC: Recent Labs  Lab 05/25/19 1421 05/26/19 0214 05/27/19 0348 05/28/19 0253 05/29/19 0541 05/29/19 2316  WBC 17.5* 22.8* 17.6* 16.4* 16.6* 15.8*  NEUTROABS 14.2*  --   --   --   --   --   HGB 10.7* 8.6* 8.3* 7.8* 6.2* 6.8*  HCT 35.1* 28.3* 27.3* 25.5* 20.4* 21.8*  MCV 89.1 89.8 88.6 87.3 87.2 86.2  PLT 251 198 228 204 188 206   Blood Culture    Component Value Date/Time   SDES BLOOD SITE NOT SPECIFIED 05/25/2019 1445   SPECREQUEST  05/25/2019 1445    BOTTLES DRAWN AEROBIC ONLY Blood Culture adequate volume   CULT  05/25/2019 1445    NO GROWTH 5 DAYS Performed at [facility], [place] [address] [place], [place] [address]    REPTSTATUS 05/30/2019 FINAL 05/25/2019 1445   Medications: . sodium chloride    . sodium chloride    . ceFEPime (MAXIPIME) IV 2 g  (05/30/19 0018)  . metronidazole 500 mg (05/30/19 0525)   . sodium chloride   Intravenous Once  . calcium acetate  1,334 mg Oral TID WC  . Chlorhexidine Gluconate Cloth  6 each Topical Q0600  . dicyclomine  20 mg Oral TID AC  . feeding supplement (NEPRO CARB STEADY)  237 mL Oral BID BM  . feeding supplement (PRO-STAT SUGAR FREE 64)  30 mL Oral BID  . insulin aspart  0-9 Units Subcutaneous TID WC  . metoprolol tartrate  25 mg Oral Q M,W,F-2000  . metoprolol tartrate  25 mg Oral 2 times per day on Sun Tue Thu Sat  . midodrine  10 mg Oral Q M,W,F-HD  . midodrine  10 mg Oral Q Tue-HD  . multivitamin  1 tablet Oral QHS  . mupirocin ointment  1 application Nasal BID  . pantoprazole  40 mg Oral Daily  . vancomycin  500 mg Intravenous Q M,W,F-HD  . vancomycin  500 mg Intravenous Q Tue-HD  . Warfarin - Pharmacist Dosing Inpatient   Does not apply q1800    Outpatient Dialysis Orders: AF MWF  4h 400/1.5x EDW 53kg 3K/2.25Ca Na Linear UFP 2 L AVF No heparin  Mircera 150 q 2 weeks (last 5/25) No VDRA  Assessment/Plan: 1. Sepsis/Fever:Felt to be related to R stump. Femur xray without signs of osteo. WBC ^ - Started on empiric Vanc/Cefepime/Flagyl. BCx 5/28 negative so far. 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 VVS following - no plans for surgical intervention at present. RN note also indicates 2 sacral wounds  2. AMS: Improving today, likely related to #1. Dementia at baseline per notes. 3. ESRD:MWF - HD bumped to Tuesday due to volume - next HD Wed to keep on schedule labs pending 4. Chronic hypotension/volume: On midodrine. Admit CXR with some pulm edema, 2L off w/ last HD.- getting a little below edw as outpatient with low gains - EDW likely needs to be lowered  5. Anemia: Hgb 7.8 > 6.2 .- was to have been transfused on HD Monday but postponed - hgb rechecked last pm 6.8 transfused 1 unit PRBC - finished this am; repeat CBC pre HD pending hold on repletion for now ferritin 1445 likely  inflammatory. FOBT 6/1 on PPI 6. Metabolic bone disease -Corr Ca ^. No VDRA. Will change binder if rises further. Follow trends. 7. Nutrition: Alb low, continue pro-stat, add nepro - 8. Afib: Takes warfarin as outpatient, on hold here.on BB  Edward Hailey, PA-C 05/30/2019, 7:56 AM  [facility] Pager: [phone]

## 2019-05-30 NOTE — Procedures (Signed)
Patient seen on Hemodialysis-- not engaging. BP (!) 156/75   Pulse 98   Temp 97.7 F (36.5 C) (Oral)   Resp 18   Ht 5\' 6"  (1.676 m)   Wt 56 kg   SpO2 94%   BMI 19.93 kg/m   QB 400, UF goal 2.0L Tolerating treatment without complaints at this time.  Elmarie Shiley MD [facility]. Office # [phone] Pager # [phone] 9:35 AM

## 2019-05-30 NOTE — Progress Notes (Signed)
ANTICOAGULATION CONSULT NOTE - Follow Up Consult  Pharmacy Consult for Coumadin Indication: atrial fibrillation  Allergies  Allergen Reactions  . Occlusive Silicone Sheets [Silicone] Other (See Comments)    "Allergic," per MAR  . Other Other (See Comments)    Unknown reaction to Occlusive adhesive- "Allergic," per MAR  . Tape Itching and Other (See Comments)    Use Cloth tape only, please    Patient Measurements: Height: 5\' 6"  (167.6 cm) Weight: 120 lb 2.4 oz (54.5 kg) IBW/kg (Calculated) : 63.8  Vital Signs: Temp: 98 F (36.7 C) (06/02 1640) Temp Source: Oral (06/02 1640) BP: 125/69 (06/02 1640) Pulse Rate: 99 (06/02 1640)  Labs: Recent Labs    05/28/19 0253 05/29/19 0541 05/29/19 2316 05/30/19 0727 05/30/19 1645  HGB 7.8* 6.2* 6.8* 7.4*  --   HCT 25.5* 20.4* 21.8* 23.1*  --   PLT 204 188 206 192  --   LABPROT 27.7* 25.7* 25.0*  --  24.5*  INR 2.6* 2.4* 2.3*  --  2.2*  CREATININE 6.12*  --   --  8.32*  --     Estimated Creatinine Clearance: 5.6 mL/min (A) (by C-G formula based on SCr of 8.32 mg/dL (H)).  Assessment:  78 yo male on warfarin PTA for afib presented 05/25/19 with confusion and fever. INR therapeutic at 2.4 on admit.  Warfarin was held for possible need for surgery (debridement). Pharmacy had been consulted to start IV heparin infusion (no bolus) when INR <2, but no heparin given due to INR remains therapeutic. Wound noted improved and no surgery is currently planned.  INR trended up to 2.7 on 5/30, now back down to 2.2 today.  Drug-drug interaction with Metronidazole,  which has now been d/c'd.     Home Coumadin regimen:  2.5 mg daily.  Last dose 5/28  Goal of Therapy:  INR 2-3 Monitor platelets by anticoagulation protocol: Yes   Plan:   Warfarin 2 mg x 1 today.  Daily PT/INR.  Shandie Bertz A. Levada Dy, PharmD, [place] Please utilize Amion for appropriate phone number to reach the unit pharmacist ([place])   05/30/2019,5:38 PM

## 2019-05-30 NOTE — Progress Notes (Signed)
Pharmacy Antibiotic Note  [name] is a 78 y.o. male admitted on 05/25/2019 with wound infection.  Pharmacy now consulted for cefazolin dosing.  Patient has ESRD, on dialysis MWF.  Afebrile, Tmax 97.9, WBC 14.6  Blood cultures no growth to date. Hx of MSSA infection  .   Plan: Cefazolin 2gm qHD on M and W and 3gm IV qHD on Fridays per ID suggestion.  Monitor clinical status, dialysis schedule, cultures, and length of therapy    Temp (24hrs), Avg:97.6 F (36.4 C), Min:97.1 F (36.2 C), Max:97.9 F (36.6 C)  Recent Labs  Lab 05/25/19 1421 05/25/19 1621 05/25/19 1900 05/25/19 2201 05/26/19 0214 05/27/19 0348 05/28/19 0253 05/29/19 0541 05/29/19 2316 05/30/19 0727  WBC 17.5*  --   --   --  22.8* 17.6* 16.4* 16.6* 15.8* 14.6*  CREATININE 4.76*  --   --   --  4.98* 4.29* 6.12*  --   --  8.32*  LATICACIDVEN 2.8* 2.2* 1.9 2.0*  --   --   --   --   --   --     Estimated Creatinine Clearance: 5.6 mL/min (A) (by C-G formula based on SCr of 8.32 mg/dL (H)).    Allergies  Allergen Reactions  . Occlusive Silicone Sheets [Silicone] Other (See Comments)    "Allergic," per MAR  . Other Other (See Comments)    Unknown reaction to Occlusive adhesive- "Allergic," per MAR  . Tape Itching and Other (See Comments)    Use Cloth tape only, please    Antimicrobials this admission: Vancomycin 5/28 >>6/2 Cefepime 5/28 >>6/2 Flagyl 5/28>>6/2 Cefazolin 6/2>>  Dose adjustments this admission:   Microbiology results: 5/29 MRSA PCR: positive 5/28 BCx: ngtd 5/28 COVID: negative   Jimmye Wisnieski A. Levada Dy, PharmD, [place] Please utilize Amion for appropriate phone number to reach the unit pharmacist ([place])   05/30/2019 3:38 PM

## 2019-05-30 NOTE — Progress Notes (Signed)
PROGRESS NOTE    [name]  OEV:[id number] DOB: 10/23/1941 DOA: 05/25/2019 PCP: Hennie Duos, MD     Brief Narrative:  78 y.o. 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  Apparently home health nurse had called from abdomen forearms and vascular and vein specialist office with concerns of some drainage from the right above-the-knee amputation therefore vascular clinic had advised by home nurse to send the patient to the ED for further evaluation.  In the ER patient was noted to be slightly confused with labs suggestive of lactic acidosis, transaminitis and leukocytosis.  Concerns for sepsis.  Chest x-ray showed slight cardiac vascular congestion.  Was evaluated by vascular surgery who recommended medical management for infectious etiology with no acute indication for surgical intervention.  Patient was admitted to medicine for further intervention.   Assessment & Plan: 1-Sepsis ([address]) -With concern for infection of right AKA stump -Sepsis features appropriately resolving/resolved -Continue IV antibiotics; but will narrow to Ancef. Case discussed with ID, plan is to treat for 10 more days.  -Follow-up vascular surgery recommendations -Patient afebrile, WBCs trending down appropriate -Continue dialysis therapy as per nephrology service. -Will provide pain medications, but will be judicious given ESRD and increase chances for extra sedation. -X-ray of the femoral demonstrated no erosive changes suggestive of osteomyelitis. -Vascular surgery to decide if noncontrast CT of right AKA needed to rule out any underlying fluid collection, abscess or need of any surgical revision. -Follow final culture data; so far no growth.  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  3-HYPERTENSION, BENIGN -Blood pressure is soft, but stable -Continue management with dialysis. -Will continue to monitor vital signs. -Continue metoprolol at current dose  4-PAF (paroxysmal atrial fibrillation) ([facility]) -Rate controlled currently -INR therapeutic -since no surgical intervention anticipated, will resume coumadin per pharmacy. -tele discontinued   5-anabolic encephalopathy -Patient with underlying history of dementia.  Records reviewed. -Continue to avoid to minimize the use of sedative agents -Constant reorientation -Continue treatment for sepsis.  6-ESRD (end stage renal disease) ([place]) -Continue dialysis therapy as per nephrology service. -Patient on Monday, Wednesday and Friday schedule. -Out of normal schedule due to increased dialysis patient volume on 05/29/2019; will have dialysis today and back on schedule on 05/31/2019.  7-secondary hyperparathyroidism -Will continue the use of PhosLo  8-anemia of chronic kidney disease -Continue the use of iron supplementation and Epogen therapy as per nephrology discretion. -Hemoglobin 7.4 -Status post 1 unit of PRBCs 05/29/2019 -Follow hemoglobin trend. -No signs of acute bleeding.   DVT prophylaxis: Chronically on Coumadin Code Status: Full code Family Communication: No family at bedside. Disposition Plan: Continue IV antibiotics, nephrology has been consulted and will pursue dialysis treatment; follow clinical response and follow recommendations by vascular surgery.  Consultants:   Neurosurgery  Nephrology  ID (Dr. Prince Rome)  Procedures:   See below for x-ray reports  Antimicrobials:  Anti-infectives (From admission, onward)   Start     Dose/Rate Route Frequency Ordered Stop   05/30/19 1200  vancomycin (VANCOCIN) IVPB 500 mg/100 ml premix     500 mg 100  mL/hr over 60 Minutes Intravenous Every Tue (Hemodialysis) 05/29/19 1649 05/30/19 1157   05/30/19 1055  vancomycin (VANCOCIN) 500-5 MG/100ML-% IVPB  Note to Pharmacy:  Lacie Scotts   : cabinet override      05/30/19 1055 05/30/19 1343   05/26/19 2200  ceFEPIme (MAXIPIME) 2 g in sodium chloride 0.9 % 100 mL IVPB     2 g 200 mL/hr over 30 Minutes Intravenous Every M-W-F 05/26/19 1439     05/26/19 1930  vancomycin (VANCOCIN) IVPB 500 mg/100 ml premix     500 mg 100 mL/hr over 60 Minutes Intravenous Every M-W-F (Hemodialysis) 05/26/19 1439     05/25/19 1745  ceFEPIme (MAXIPIME) 2 g in sodium chloride 0.9 % 100 mL IVPB  Status:  Discontinued     2 g 200 mL/hr over 30 Minutes Intravenous  Once 05/25/19 1739 05/25/19 1743   05/25/19 1745  metroNIDAZOLE (FLAGYL) IVPB 500 mg     500 mg 100 mL/hr over 60 Minutes Intravenous Every 8 hours 05/25/19 1739     05/25/19 1745  vancomycin (VANCOCIN) IVPB 1000 mg/200 mL premix  Status:  Discontinued     1,000 mg 200 mL/hr over 60 Minutes Intravenous  Once 05/25/19 1739 05/25/19 1743   05/25/19 1445  vancomycin variable dose per unstable renal function (pharmacist dosing)  Status:  Discontinued      Does not apply See admin instructions 05/25/19 1445 05/26/19 1439   05/25/19 1445  ceFAZolin (ANCEF) IVPB 2g/100 mL premix  Status:  Discontinued     2 g 200 mL/hr over 30 Minutes Intravenous  Once 05/25/19 1435 05/25/19 1437   05/25/19 1445  ceFEPIme (MAXIPIME) 2 g in sodium chloride 0.9 % 100 mL IVPB     2 g 200 mL/hr over 30 Minutes Intravenous  Once 05/25/19 1437 05/25/19 1623   05/25/19 1445  vancomycin (VANCOCIN) 1,250 mg in sodium chloride 0.9 % 250 mL IVPB     1,250 mg 166.7 mL/hr over 90 Minutes Intravenous  Once 05/25/19 1444 05/25/19 1813      Subjective: No fever, no chest pain, no nausea, no vomiting.   Objective: Vitals:   05/30/19 1000 05/30/19 1030 05/30/19 1100 05/30/19 1145  BP: (!) 99/91 (!) 169/112 (!) 110/48 (!) 118/99  Pulse:  91 61 95 89  Resp:    16  Temp:    97.6 F (36.4 C)  TempSrc:    Oral  SpO2:    100%  Weight:    54.5 kg  Height:        Intake/Output Summary (Last 24 hours) at 05/30/2019 1515 Last data filed at 05/30/2019 1145 Gross per 24 hour  Intake 1145 ml  Output 1002 ml  Net 143 ml   Filed Weights   05/30/19 0546 05/30/19 0730 05/30/19 1145  Weight: 56.2 kg 56 kg 54.5 kg    Examination: General exam: Alert, awake, oriented x 2; following commands appropriately and denying any acute complaints.  No chest pain, no shortness of breath, no nausea, no vomiting.  Patient is afebrile Respiratory system: Clear to auscultation. Respiratory effort normal. Cardiovascular system:RRR. No murmurs, rubs, gallops. Gastrointestinal system: Abdomen is nondistended, soft and nontender. No organomegaly or masses felt. Normal bowel sounds heard. Central nervous system: Alert and oriented. No focal neurological deficits. Extremities: Positive left BKA; positive right AKA.  Currently without active drainage, mild pain to palpation only. Skin: No petechiae; 2 stage II decubitus sacral ulcer, present on admission and without signs of superimposed infection on exam.   Psychiatry: Judgement and insight appear normal. Mood & affect appropriate.   Data Reviewed: I have personally reviewed following labs and imaging  studies  CBC: Recent Labs  Lab 05/25/19 1421  05/27/19 0348 05/28/19 0253 05/29/19 0541 05/29/19 2316 05/30/19 0727  WBC 17.5*   < > 17.6* 16.4* 16.6* 15.8* 14.6*  NEUTROABS 14.2*  --   --   --   --   --   --   HGB 10.7*   < > 8.3* 7.8* 6.2* 6.8* 7.4*  HCT 35.1*   < > 27.3* 25.5* 20.4* 21.8* 23.1*  MCV 89.1   < > 88.6 87.3 87.2 86.2 85.9  PLT 251   < > 228 204 188 206 192   < > = values in this interval not displayed.   Basic Metabolic Panel: Recent Labs  Lab 05/25/19 1421 05/26/19 0214 05/27/19 0348 05/28/19 0253 05/29/19 0541 05/29/19 2316 05/30/19 0727  NA 138 140 138 137  --   --   137  K 4.8 4.2 3.8 3.7  --   --  5.1  CL 95* 101 97* 102  --   --  101  CO2 26 21* 23 21*  --   --  17*  GLUCOSE 130* 127* 117* 151*  --   --  99  BUN 50* 59* 51* 80*  --   --  158*  CREATININE 4.76* 4.98* 4.29* 6.12*  --   --  8.32*  CALCIUM 10.4* 9.5 9.3 9.3  --   --  10.0  MG  --  1.9 1.8 2.0 2.1 2.4  --   PHOS  --   --   --   --   --   --  2.6   GFR: Estimated Creatinine Clearance: 5.6 mL/min (A) (by C-G formula based on SCr of 8.32 mg/dL (H)).   Liver Function Tests: Recent Labs  Lab 05/25/19 1421 05/26/19 0214 05/27/19 0348 05/28/19 0253 05/30/19 0727  AST 77* 49* 55* 31  --   ALT 58* 44 55* 42  --   ALKPHOS 288* 235* 272* 261*  --   BILITOT 1.2 0.7 0.8 0.6  --   PROT 8.2* 6.8 7.0 6.5  --   ALBUMIN 2.9* 2.4* 2.4* 2.2* 2.3*   Coagulation Profile: Recent Labs  Lab 05/26/19 0214 05/27/19 0651 05/28/19 0253 05/29/19 0541 05/29/19 2316  INR 2.7* 2.7* 2.6* 2.4* 2.3*   CBG: Recent Labs  Lab 05/29/19 1154 05/29/19 1642 05/29/19 2242 05/30/19 0802 05/30/19 1354  GLUCAP 122* 108* 101* 92 84   Urine analysis:    Component Value Date/Time   COLORURINE YELLOW 10/20/2008 [place] 10/20/2008 0942   LABSPEC 1.011 10/20/2008 0942   PHURINE 6.0 10/20/2008 0942   GLUCOSEU NEGATIVE 10/20/2008 0942   HGBUR LARGE (A) 10/20/2008 0942   BILIRUBINUR NEGATIVE 10/20/2008 0942   KETONESUR NEGATIVE 10/20/2008 0942   PROTEINUR 100 (A) 10/20/2008 0942   UROBILINOGEN 1.0 10/20/2008 0942   NITRITE NEGATIVE 10/20/2008 0942   LEUKOCYTESUR SMALL (A) 10/20/2008 0942    Recent Results (from the past 240 hour(s))  Blood Culture (routine x 2)     Status: None   Collection Time: 05/25/19  2:24 PM  Result Value Ref Range Status   Specimen Description BLOOD RIGHT ANTECUBITAL  Final   Special Requests   Final    BOTTLES DRAWN AEROBIC AND ANAEROBIC Blood Culture adequate volume   Culture   Final    NO GROWTH 5 DAYS Performed at [facility], [place] [address] [place], [place] [address]    Report Status 05/30/2019 FINAL  Final  Blood  Culture (routine x 2)     Status: None   Collection Time: 05/25/19  2:45 PM  Result Value Ref Range Status   Specimen Description BLOOD SITE NOT SPECIFIED  Final   Special Requests   Final    BOTTLES DRAWN AEROBIC ONLY Blood Culture adequate volume   Culture   Final    NO GROWTH 5 DAYS Performed at [facility], [address], [place], [place] [address]    Report Status 05/30/2019 FINAL  Final  SARS Coronavirus 2 (CEPHEID - Performed in [place] hospital lab), Hosp Order     Status: None   Collection Time: 05/25/19  3:03 PM  Result Value Ref Range Status   SARS Coronavirus 2 NEGATIVE NEGATIVE Final    Comment: (NOTE) If result is NEGATIVE SARS-CoV-2 target nucleic acids are NOT DETECTED. The SARS-CoV-2 RNA is generally detectable in upper and lower  respiratory specimens during the acute phase of infection. The lowest  concentration of SARS-CoV-2 viral copies this assay can detect is 250  copies / mL. A negative result does not preclude SARS-CoV-2 infection  and should not be used as the sole basis for treatment or other  patient management decisions.  A negative result may occur with  improper specimen collection / handling, submission of specimen other  than nasopharyngeal swab, presence of viral mutation(s) within the  areas targeted by this assay, and inadequate number of viral copies  (<250 copies / mL). A negative result must be combined with clinical  observations, patient history, and epidemiological information. If result is POSITIVE SARS-CoV-2 target nucleic acids are DETECTED. The SARS-CoV-2 RNA is generally detectable in upper and lower  respiratory specimens dur ing the acute phase of infection.  Positive  results are indicative of active infection with SARS-CoV-2.  Clinical  correlation with patient history and other diagnostic information is  necessary to determine patient  infection status.  Positive results do  not rule out bacterial infection or co-infection with other viruses. If result is PRESUMPTIVE POSTIVE SARS-CoV-2 nucleic acids MAY BE PRESENT.   A presumptive positive result was obtained on the submitted specimen  and confirmed on repeat testing.  While 2019 novel coronavirus  (SARS-CoV-2) nucleic acids may be present in the submitted sample  additional confirmatory testing may be necessary for epidemiological  and / or clinical management purposes  to differentiate between  SARS-CoV-2 and other Sarbecovirus currently known to infect humans.  If clinically indicated additional testing with an alternate test  methodology [phone]) is advised. The SARS-CoV-2 RNA is generally  detectable in upper and lower respiratory sp ecimens during the acute  phase of infection. The expected result is Negative. Fact Sheet for Patients:  StrictlyIdeas.no Fact Sheet for Healthcare Providers: BankingDealers.co.za This test is not yet approved or cleared by the [place] FDA and has been authorized for detection and/or diagnosis of SARS-CoV-2 by FDA under an Emergency Use Authorization (EUA).  This EUA will remain in effect (meaning this test can be used) for the duration of the COVID-19 declaration under Section 564(b)(1) of the Act, 21 U.S.C. section 360bbb-3(b)(1), unless the authorization is terminated or revoked sooner. Performed at [facility], [place] [address] [place], [place] [address]   MRSA PCR Screening     Status: Abnormal   Collection Time: 05/26/19  1:54 PM  Result Value Ref Range Status   MRSA by PCR POSITIVE (A) NEGATIVE Final    Comment:        The GeneXpert  MRSA Assay (FDA approved for NASAL specimens only), is one component of a comprehensive MRSA colonization surveillance program. It is not intended to diagnose MRSA infection nor to guide or monitor treatment for MRSA infections.  RESULT CALLED TO, READ BACK BY AND VERIFIED WITH: Sherlyn Lick RN 17:25 05/26/19 ([email or web address]) Performed at [facility], [place] [address], [place], [place] [address]      Radiology Studies: No results found.  Scheduled Meds: . sodium chloride   Intravenous Once  . calcium acetate  1,334 mg Oral TID WC  . Chlorhexidine Gluconate Cloth  6 each Topical Q0600  . dicyclomine  20 mg Oral TID AC  . feeding supplement (NEPRO CARB STEADY)  237 mL Oral BID BM  . feeding supplement (PRO-STAT SUGAR FREE 64)  30 mL Oral BID  . insulin aspart  0-9 Units Subcutaneous TID WC  . metoprolol tartrate  25 mg Oral Q M,W,F-2000  . metoprolol tartrate  25 mg Oral 2 times per day on Sun Tue Thu Sat  . midodrine  10 mg Oral Q M,W,F-HD  . multivitamin  1 tablet Oral QHS  . mupirocin ointment  1 application Nasal BID  . pantoprazole  40 mg Oral Daily  . vancomycin  500 mg Intravenous Q M,W,F-HD  . Warfarin - Pharmacist Dosing Inpatient   Does not apply q1800   Continuous Infusions: . ceFEPime (MAXIPIME) IV 2 g (05/30/19 0018)  . metronidazole 500 mg (05/30/19 0525)     LOS: 5 days    Time spent: More than 50% of the time has been in direct contact with the patient, coordinating care and discussing relevant ongoing clinical issues.  Case has been discussed with nephrologist, nursing staff has been updated and phone consultation with infectious disease-to further determine length of therapy of his IV antibiotics (Case discussed with Dr. Prince Rome).  CVs hemodynamically stable, will transition antibiotics to Ancef watch him for another 24 hours and if remains stable discharge with a total course of 10 days that can be given with hemodialysis.   Barton Dubois, MD [facility] Pager [phone]  05/30/2019, 3:15 PM

## 2019-05-31 LAB — TYPE AND SCREEN
ABO/RH(D): O POS
Antibody Screen: NEGATIVE
Unit division: 0

## 2019-05-31 LAB — RENAL FUNCTION PANEL
Albumin: 2.3 g/dL — ABNORMAL LOW (ref 3.5–5.0)
Anion gap: 14 (ref 5–15)
BUN: 44 mg/dL — ABNORMAL HIGH (ref 8–23)
CO2: 24 mmol/L (ref 22–32)
Calcium: 9.1 mg/dL (ref 8.9–10.3)
Chloride: 98 mmol/L (ref 98–111)
Creatinine, Ser: 4.29 mg/dL — ABNORMAL HIGH (ref 0.61–1.24)
GFR calc Af Amer: 14 mL/min — ABNORMAL LOW (ref >60)
GFR calc non Af Amer: 12 mL/min — ABNORMAL LOW (ref >60)
Glucose, Bld: 140 mg/dL — ABNORMAL HIGH (ref 70–99)
Phosphorus: 3 mg/dL (ref 2.5–4.6)
Potassium: 3.6 mmol/L (ref 3.5–5.1)
Sodium: 136 mmol/L (ref 135–145)

## 2019-05-31 LAB — CBC
HCT: 24.7 % — ABNORMAL LOW (ref 39.0–52.0)
Hemoglobin: 7.6 g/dL — ABNORMAL LOW (ref 13.0–17.0)
MCH: 27.2 pg (ref 26.0–34.0)
MCHC: 30.8 g/dL (ref 30.0–36.0)
MCV: 88.5 fL (ref 80.0–100.0)
Platelets: 220 10*3/uL (ref 150–400)
RBC: 2.79 MIL/uL — ABNORMAL LOW (ref 4.22–5.81)
RDW: 19.1 % — ABNORMAL HIGH (ref 11.5–15.5)
WBC: 10.3 10*3/uL (ref 4.0–10.5)
nRBC: 0 % (ref 0.0–0.2)

## 2019-05-31 LAB — BPAM RBC
Blood Product Expiration Date: 202006242359
ISSUE DATE / TIME: 202006020131
Unit Type and Rh: 5100

## 2019-05-31 LAB — PROTIME-INR
INR: 2 — ABNORMAL HIGH (ref 0.8–1.2)
Prothrombin Time: 22.4 seconds — ABNORMAL HIGH (ref 11.4–15.2)

## 2019-05-31 LAB — GLUCOSE, CAPILLARY
Glucose-Capillary: 83 mg/dL (ref 70–99)
Glucose-Capillary: 81 mg/dL (ref 70–99)

## 2019-05-31 MED ORDER — CALCIUM ACETATE (PHOS BINDER) 667 MG PO CAPS
667.0000 mg | ORAL_CAPSULE | Freq: Three times a day (TID) | ORAL | Status: DC
  Administered 2019-05-31: 667 mg via ORAL
  Filled 2019-05-31: qty 1

## 2019-05-31 MED ORDER — DEXTROSE 5 % IV SOLN: 3.0000 g | INTRAVENOUS | Status: DC

## 2019-05-31 MED ORDER — WARFARIN SODIUM 2.5 MG PO TABS
2.5000 mg | ORAL_TABLET | Freq: Once | ORAL | Status: AC
  Administered 2019-05-31: 2.5 mg via ORAL
  Filled 2019-05-31: qty 1

## 2019-05-31 MED ORDER — HYDROCODONE-ACETAMINOPHEN 5-325 MG PO TABS: 1.0000 | ORAL_TABLET | Freq: Four times a day (QID) | ORAL | 0 refills | Status: DC | PRN

## 2019-05-31 MED ORDER — DARBEPOETIN ALFA 150 MCG/0.3ML IJ SOSY: 150.0000 ug | PREFILLED_SYRINGE | INTRAMUSCULAR | Status: DC

## 2019-05-31 MED ORDER — CEFAZOLIN SODIUM-DEXTROSE 2-4 GM/100ML-% IV SOLN
2.0000 g | INTRAVENOUS | Status: DC
  Filled 2019-05-31: qty 100

## 2019-05-31 MED ORDER — CEFAZOLIN SODIUM-DEXTROSE 2-4 GM/100ML-% IV SOLN: 2.0000 g | Freq: Every day | INTRAVENOUS | Status: DC

## 2019-05-31 MED ORDER — CEFAZOLIN SODIUM-DEXTROSE 2-4 GM/100ML-% IV SOLN: INTRAVENOUS | Status: DC

## 2019-05-31 NOTE — Discharge Instructions (Signed)
Antibiotic Medicine, Adult ° °Antibiotic medicines are used to treat infections caused by bacteria, such as strep throat and urinary tract infection (UTI). Antibiotic medicines will not work for viral illnesses, such as colds or the flu (influenza). They work by killing the bacteria that is making you sick. Antibiotics can also have serious side effects. It is important that you take antibiotic medicines safely and only when needed. °When [name] need to take antibiotics? °Antibiotics are medicines that treat bacterial infections. You may need antibiotics for: °· UTI. °· Strep throat. °· Meningitis. This infection affects the spinal cord and brain. °· Bacterial sinusitis. °· Serious lung infection. °You may start antibiotics while your health care provider waits for test results to come back. Common tests may include throat, urine, blood, or mucus culture. Your health care provider may change or stop the antibiotic depending on your test results. °When are antibiotics not needed? °You [name] not need antibiotics for most common illnesses. These illnesses may be caused by a virus, not a bacteria. You [name] not need antibiotics for: °· The common cold. °· Influenza. °· Sore throat. °· Discolored mucus. °· Bronchitis. °Antibiotics are not always needed for all bacterial infections. Many of these infections clear up without antibiotic treatment. [name] not ask for or take antibiotics when they are not necessary. °How long should [name] take the antibiotic? °You must take the entire prescription. Continue to take your antibiotic for as long as told by your health care provider. [name] not stop taking it even if you start to feel better. If you stop taking it too soon: °· You may start to feel sick again. °· Your infection may become harder to treat. °· Complications may develop. °Each course of antibiotics needs a different amount of time to work. Some antibiotic courses last only a few days. Some last about a week to 10 days. In some cases,  you may need to take antibiotics for a few weeks to completely treat the infection. °What if [name] miss a dose? °Try not to miss any doses of medicine. If you miss a dose, call your health care provider or pharmacist for advice. Sometimes it is okay to take the missed dose as soon as possible. °What are the risks of taking antibiotics? °Most antibiotics can cause an infection called Clostridioides difficile (C. difficile or C. diff), which causes severe diarrhea. This infection happens when the antibiotics kill the healthy bacteria in your intestines. This allows C. diff to grow. The infection needs to be treated right away. Let your health care provider know if: °· You have diarrhea while taking an antibiotic. °· You have diarrhea after you stop taking an antibiotic. C. diff infection can start weeks after stopping the antibiotic. °Taking an antibiotic also puts you at risk for getting a bacteria that does not respond to medicine (antibiotic-resistant infection) in the future. Antibiotics can cause bacteria to change so that if the antibiotic is taken again, the medicine is not able to kill the bacteria. These infections can be more serious and, in some cases, life-threatening. °[name] antibiotics affect birth control? °Birth control pills may not work while you are on antibiotics. If you are taking birth control pills, continue taking them as usual and use a second form of birth control, such as a condom, to avoid unwanted pregnancy. Continue using the second form of birth control until your health care provider says you can stop. °What else should [name] know about taking antibiotics? °It is important for you to   take antibiotics exactly as told. Make sure that you:  Take the entire course of antibiotic that was prescribed. [name] not stop taking your antibiotics even if your symptoms improve.  Take the correct amount of medicine each day.  Ask your health care provider: ? How long to wait in between doses. ? If the  antibiotic should be taken with food. ? If there are any foods, drinks, or medicines that you should avoid while taking the antibiotics. ? If there are any side effects you should be aware of.  Only use the antibiotics prescribed for you by your health care provider. [name] not use antibiotics prescribed for someone else.  Drink a large glass of water along with the antibiotics.  Ask the pharmacist for a syringe, cup, or spoon that properly measures the antibiotics.  Throw away any leftover medicine. Contact a health care provider if:  Your symptoms get worse.  You have new joint pain or muscle aches that begin after starting the antibiotic. When should [name] seek immediate medical care?  You have signs of a serious allergic reaction to antibiotics. If you have signs of a severe allergic reaction, stop taking the antibiotic right away. Signs may include: ? Hives, which are raised, itchy, red bumps on the skin. ? Skin rash. ? Trouble breathing. ? A wheezing sound when you breathe. ? Swelling anywhere on your body. ? Feeling dizzy. ? Vomiting.  Your urine turns dark or becomes blood-colored.  Your skin turns yellow.  You bruise or bleed easily.  You have severe diarrhea and abdominal cramps.  You have a severe headache. Summary  Antibiotic medicines are used to treat infections caused by bacteria, such as strep throat and UTIs. It is important that you take antibiotic medicines only when needed.  Your health care provider may change or stop the antibiotic depending on your test results.  Most antibiotics can cause an infection called Clostridioides difficile (C. difficile or C. diff), which causes severe diarrhea. Let your health care provider know if you develop diarrhea while taking an antibiotic.  Take the entire course of antibiotic that was prescribed. This information is not intended to replace advice given to you by your health care provider. Make sure you discuss any  questions you have with your health care provider. Document Released: 08/26/2004 Document Revised: 06/14/2018 Document Reviewed: 12/15/2016 Elsevier Interactive Patient Education  2019 Elsevier Inc[employer]   Sepsis, Self Care, Adult Sepsis is a serious illness that may require intensive care in the hospital. The following information explains what you need to know in order to manage your condition after you are discharged from the hospital. What are the risks? After being treated for sepsis and discharged from the hospital, you may be at a higher risk for certain problems. These problems may be physical or mental. Physical problems:  Weakness and tiredness.  Shortness of breath.  Pain in many areas of the body.  Difficulty walking.  Dry, itchy skin.  Lack of appetite. This may lead to weight loss.  Organ failure. Mental problems:  Difficulty sleeping.  Depression.  Confusion.  Anxiety and worry caused by having gone through a bad experience (post-traumatic stress disorder,PTSD).  Low self-esteem. Follow these instructions at home: Medicines  Take over-the-counter and prescription medicines only as told by your health care provider.  If you were prescribed an antibiotic, antiviral, or antifungal medicine, take it as told by your health care provider. [name] not stop taking the medicine even if you start to feel better.  Eating and drinking   Eat a healthy diet that includes plenty of vegetables, fruits, whole grains, low-fat dairy products, and lean protein. Ask your health care provider if you should avoid certain foods.  Drink enough fluid to keep your urine pale yellow. Alcohol use  [name] not drink alcohol if: ? Your health care provider tells you not to drink. ? You are pregnant, may be pregnant, or are planning to become pregnant.  If you drink alcohol, limit how much you use to: ? 0-1 drink a day for women. ? 0-2 drinks a day for men.  Be aware of how much alcohol is  in your drink. In the [place] one drink equals one 12 oz bottle of beer (355 mL), one 5 oz glass of wine (148 mL), or one 1 oz glass of hard liquor (44 mL). Activity  Rest and gradually return to your normal activities. Ask your health care provider what activities are safe for you.  Avoid sitting for a long time without moving. Get up to take short walks every 1-2 hours. This is important to improve blood flow and breathing. Ask for help if you feel weak or unsteady.  Try to set small, achievable goals each week, such as dressing yourself, bathing, or walking up the stairs. It may take a while to rebuild your strength.  Try to exercise regularly if you feel healthy enough to [name] so. Ask your health care provider what exercises are safe for you. Preventing infection   Keep your vaccinations up to date. Get the flu shot every year.  Wash your hands often using soap and water. Use hand sanitizer if soap and water are not available.  Practice good hygiene. Keep cuts clean and covered until healed. Managing stress Talk with your health care provider or counselor about ways to reduce stress. He or she may suggest:  Meditation, muscle relaxation, and breathing exercises.  Talk therapy.  Spending time on hobbies and activities that you enjoy. General instructions  Get the right amount and quality of sleep. Most adults need 7-9 hours of sleep each night. To help with sleep: ? Keep your bedroom cool and dark. ? [name] not eat a heavy meal within one hour of bedtime. ? [name] not drink alcohol or caffeinated drinks before bed. ? Avoid screen time, such as television, computers, tablets, or cell phones before bed.  [name] not use any products that contain nicotine or tobacco, such as cigarettes, e-cigarettes, and chewing tobacco. If you need help quitting, ask your health care provider.  Talk to trusted family members and friends about your condition. Explain your symptoms to them, and let them know that  you are working with a health care provider to treat your condition. This can provide you with one way to get support and guidance.  Keep all follow-up visits as told by your health care provider. This is important. Questions to ask your health care provider:  What physical and emotional changes [name] [name] need to report?  [name] [name] need to have someone with me all the time?  Is it safe for me to drive? Contact a health care provider if you:  Edward Oleson not feel like you are getting better or regaining strength.  Have muscle or joint pain.  Frequently feel tired.  Are having trouble coping with your recovery.  Have nightmares, or trouble falling asleep or staying asleep.  Feel sad, down, or depressed more often than not, every day for more than 2 weeks.  Have difficulty  concentrating.  Feel irritable or you cry for no reason. Get help right away if you:  Have difficulty breathing.  Have a rapid or skipping heartbeat.  Become confused or disoriented.  See, hear, or feel things that Shelisa Fern not exist (hallucinations).  Have a high fever.  Have an infection that is getting worse or not getting better.  You have thoughts of hurting yourself or others. If you ever feel like you may hurt yourself or others, or have thoughts about taking your own life, get help right away. You can go to your nearest emergency department or call:  Your local emergency services (911 in the [place]  A suicide crisis helpline, such as the [place] at 364-667-1587. This is open 24 hours a day. Summary  Sepsis is a serious illness that may require intensive care in a hospital. You may experience long-term health effects after you are discharged from the hospital.  Try to set small, achievable goals each week, such as dressing yourself, bathing, or walking up the stairs. It may take a while to rebuild your strength.  Keep all follow-up visits as told by your health care provider. This is  important.  Know what symptoms you should get help right away for. This information is not intended to replace advice given to you by your health care provider. Make sure you discuss any questions you have with your health care provider. Document Released: 07/22/2018 Document Revised: 07/22/2018 Document Reviewed: 07/22/2018 Elsevier Interactive Patient Education  2019 [employer].

## 2019-05-31 NOTE — Care Management Important Message (Signed)
Important Message  Patient Details  Name: [name] MRN: [id number] Date of Birth: Jan 22, 1941   Medicare Important Message Given:  Yes    Orbie Pyo 05/31/2019, 1:34 PM

## 2019-05-31 NOTE — Progress Notes (Signed)
Pt stable, dc back to [facility]. RN called to give report but no one pick up. Handed all discharge paperwork to [facility].

## 2019-05-31 NOTE — Progress Notes (Signed)
ANTICOAGULATION CONSULT NOTE - Follow Up Consult  Pharmacy Consult for Coumadin Indication: atrial fibrillation  Allergies  Allergen Reactions  . Occlusive Silicone Sheets [Silicone] Other (See Comments)    "Allergic," per MAR  . Other Other (See Comments)    Unknown reaction to Occlusive adhesive- "Allergic," per MAR  . Tape Itching and Other (See Comments)    Use Cloth tape only, please    Patient Measurements: Height: 5\' 6"  (167.6 cm) Weight: 123 lb 7.3 oz (56 kg) IBW/kg (Calculated) : 63.8  Vital Signs: Temp: 98.5 F (36.9 C) (06/03 0552) Temp Source: Oral (06/03 0552) BP: 102/55 (06/03 0552) Pulse Rate: 87 (06/03 0552)  Labs: Recent Labs    05/29/19 0541 05/29/19 2316 05/30/19 0727 05/30/19 1645 05/31/19 0230  HGB 6.2* 6.8* 7.4*  --   --   HCT 20.4* 21.8* 23.1*  --   --   PLT 188 206 192  --   --   LABPROT 25.7* 25.0*  --  24.5* 22.4*  INR 2.4* 2.3*  --  2.2* 2.0*  CREATININE  --   --  8.32*  --   --     Estimated Creatinine Clearance: 5.8 mL/min (A) (by C-G formula based on SCr of 8.32 mg/dL (H)).  Assessment: 77 YOM on warfarin PTA for hx Afib. Admit INR 2.4 on PTA dose. Warfarin was held due to need for possible surgery (debridement). INR remained therapeutic despite held doses and warfarin dosing was resumed on 6/1 as no surgery was planned.  INR today is therapeutic (INR 2, goal of 2-3). Noted flagyl was discontinued yesterday which has a major DDI with Warfarin. No CBC yet today - likely to get pre-HD. No overt bleeding noted.   PTA Warfarin dose: 2.5 mg daily   Goal of Therapy:  INR 2-3 Monitor platelets by anticoagulation protocol: Yes   Plan:  - Warfarin 2.5 mg x 1 dose at 1800 today - Will continue to monitor for any signs/symptoms of bleeding and will follow up with PT/INR in the a.m.   Thank you for allowing pharmacy to be a part of this patient's care.  Alycia Rossetti, PharmD, BCPS Clinical Pharmacist Clinical phone for 05/31/2019:  [phone] 05/31/2019 10:02 AM   **Pharmacist phone directory can now be found on [email or web address] ([email or web address]).  Listed under [place].

## 2019-05-31 NOTE — TOC Initial Note (Signed)
Transition of Care [facility]) - Initial/Assessment Note    Patient Details  Name: [name] MRN: [id number] Date of Birth: 07-29-1941  [facility] CM/SW Contact:    Benard Halsted, LCSW Phone Number: 05/31/2019, 12:30 PM  Clinical Narrative:                 Patient resides at [facility] for ltc. CSW left voicemail for patient's daughter to make her aware of the plan.   Expected Discharge Plan: Skilled Nursing Facility Barriers to Discharge: No Barriers Identified   Patient Goals and CMS Choice Patient states their goals for this hospitalization and ongoing recovery are:: Return to SNF CMS Medicare.gov Compare Post Acute Care list provided to:: (NA) Choice offered to / list presented to : NA  Expected Discharge Plan and Services Expected Discharge Plan: [place] In-house Referral: Clinical Social Work Discharge Planning Services: NA Post Acute Care Choice: [place] Living arrangements for the past 2 months: [place] Expected Discharge Date: 05/31/19               DME Arranged: N/A DME Agency: NA       HH Arranged: NA [place]: NA        Prior Living Arrangements/Services Living arrangements for the past 2 months: [place] Lives with:: Facility Resident Patient language and need for interpreter reviewed:: Yes Do you feel safe going back to the place where you live?: Yes      Need for Family Participation in Patient Care: Yes (Comment) Care giver support system in place?: Yes (comment)   Criminal Activity/Legal Involvement Pertinent to Current Situation/Hospitalization: No - Comment as needed  Activities of Daily Living      Permission Sought/Granted Permission sought to share information with : Facility Sport and exercise psychologist, Family Supports Permission granted to share information with : No  Share Information with NAME: [name]  Permission granted to share info w AGENCY: [place] granted to share info w Relationship: Daughter  Permission granted to share info w Contact Information: [phone]  Emotional Assessment Appearance:: Appears stated age Attitude/Demeanor/Rapport: Unable to Assess Affect (typically observed): Unable to Assess Orientation: : Oriented to Self, Oriented to Place Alcohol / Substance Use: Not Applicable Psych Involvement: No (comment)  Admission diagnosis:  Possible Sepsis Patient Active Problem List   Diagnosis Date Noted  . 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 Anemia due to end stage renal disease ([place]) 03/24/2019  . Protein-calorie malnutrition, severe ([place]) 03/24/2019  . Hyperlipidemia associated with type 2 diabetes mellitus ([place]) 02/22/2019  . Amputation stump infection ([place]) 02/15/2019  . Conjunctivitis of left eye 01/04/2019  . 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[place]) 12/30/2018  . Open wound of left foot   . Anticoagulation management encounter   . OSA (obstructive sleep apnea)   . HTN (hypertension)   . ESRD on hemodialysis ([place])   . BPH (benign prostatic hyperplasia)   . Hypotension 12/07/2018  . GERD (gastroesophageal reflux disease) 12/07/2018  . Pressure injury of skin 12/03/2018  . Symptomatic anemia 12/02/2018  . Acute on chronic blood loss anemia 12/02/2018  . 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[place]) 12/02/2018  . Lower GI bleeding 12/02/2018  .  Heme positive stool   . Acute on chronic diastolic (congestive) heart failure ([place]) 11/19/2018  . Acute CVA (cerebrovascular accident) ([place]) 11/19/2018  . Hypoglycemia 11/19/2018  . Peripheral vascular disease due to secondary diabetes ([place]) 11/19/2018  . Dry gangrene ([place])  11/19/2018  . 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 DNR (do not resuscitate) discussion   . Goals of care, counseling/discussion   . Palliative care by specialist   . Acute hypoxemic respiratory failure ([place]) 11/06/2018  . Volume overload 11/06/2018  . Multifocal pneumonia 11/06/2018  . Acute metabolic encephalopathy 35/36/1443  . Physical deconditioning 11/06/2018  . Acute respiratory failure with hypoxia and hypercapnia ([place]) 11/04/2018  . CAP (community acquired pneumonia) 11/04/2018  . Pleural effusion, right 11/04/2018  . Increased ammonia level 11/04/2018  . Abnormal CT of the abdomen 11/04/2018  . Atrial fibrillation with RVR ([place]) 11/04/2018  . Sepsis ([place]) 10/21/2018  . Acute encephalopathy 10/21/2018  . Elevated alkaline phosphatase level 10/21/2018  . Chronic anemia 10/21/2018  . Thrombocytopenia ([place]) 10/21/2018  . Pulmonary HTN ([place]) 03/04/2017  . Aortic valve stenosis 03/04/2017  . Hypertension, accelerated with heart disease, without CHF 01/12/2017  . Dyspnea on exertion 01/12/2017  . ESRD (end stage renal disease) ([place]) 06/07/2012  . Hyperlipidemia 04/24/2011  . HYPERTENSION, BENIGN 10/24/2009  . Type 2 diabetes mellitus with hypertension and end stage renal disease on dialysis ([place]) 10/23/2009  . OBSTRUCTIVE SLEEP APNEA 10/23/2009  . PAF (paroxysmal atrial fibrillation) ([place]) 10/23/2009  . CHF (congestive heart failure) ([place]) 10/23/2009   PCP:  Hennie Duos, MD Pharmacy:   [facility] [address] [place] [address] Phone: [phone] Fax: [phone]     Social Determinants of Health (SDOH) Interventions    Readmission Risk Interventions No flowsheet data found.

## 2019-05-31 NOTE — Discharge Summary (Addendum)
Physician Discharge Summary  [name] LOV:[id number] DOB: 05-Jun-1941 DOA: 05/25/2019  PCP: Hennie Duos, MD  Admit date: 05/25/2019 Discharge date: 05/31/2019  Admitted From: SNF Disposition:  SNF  Recommendations for Outpatient Follow-up:  1. Follow up with PCP in 2-3 weeks 2. Follow up with HD as scheduled 3. 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 Apparently home health nurse had called from abdomen forearms and vascular and vein specialist office with concerns of some drainage from the right above-the-knee amputation therefore vascular clinic had advised by home nurse to send the patient to the ED for further evaluation.  In the ER patient was noted to be slightly confused with labs suggestive of lactic acidosis, transaminitis and leukocytosis. Concerns for sepsis. Chest x-ray showed slight cardiac vascular congestion. Was evaluated by vascular surgery who recommended medical management for infectious etiology with no acute indication for surgical intervention. Patient was admitted to medicine for further intervention.   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)   HYPERTENSION, BENIGN   PAF (paroxysmal atrial fibrillation) (HCC)   Hyperlipidemia   ESRD (end stage renal disease) (HCC)   Pulmonary HTN (HCC)   Acute encephalopathy   Chronic anemia   GERD (gastroesophageal reflux disease)   HTN (hypertension)   BPH  (benign prostatic hyperplasia)   Amputation stump infection ([place])   End stage renal disease on dialysis due to type 2 diabetes mellitus ([place])   Anemia due to end stage renal disease ([place])  1-Sepsis ([place]) -With concern for infection of right AKA stump -Sepsis features appropriately resolving/resolved -Continue IV antibiotics; but will narrow to Ancef. Case discussed with ID, plan is to treat for 9 more days on dialysis days, complete through 06/08/19 -Patient afebrile, WBCs trending down appropriate -Continue dialysis therapy as per nephrology service. -Will provide pain medications, but will be judicious given ESRD and increase chances for extra sedation. -X-ray of the femoral demonstrated no erosive changes suggestive of osteomyelitis. -Seen by Vascular Surgery who recommended no further work up needed -Follow final culture data; so far no growth.  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 -Will continue to monitor vital signs. -Continue metoprolol at current dose  4-PAF (paroxysmal atrial fibrillation) (HCC) -Rate controlled currently -INR therapeutic -since no surgical intervention anticipated, will resume coumadin per pharmacy. -tele discontinued   5-anabolic encephalopathy -Patient with underlying history of dementia.  Records reviewed. -Continue to avoid to minimize the use of sedative agents -Constant reorientation -Continue treatment for sepsis.  6-ESRD (end stage renal disease) ([place]) -Continue dialysis therapy as per nephrology service. -Patient on Monday, Wednesday and Friday schedule. -Last HD on 05/31/19  7-secondary hyperparathyroidism -Will continue the use of PhosLo  8-anemia of chronic kidney disease -Continue the use of iron supplementation and Epogen  therapy as per nephrology discretion. -Hemoglobin 7.4 -Status post 1 unit of PRBCs 05/29/2019 -Follow hemoglobin trend. -No signs of acute bleeding.  Discharge  Instructions   Allergies as of 05/31/2019      Reactions   Occlusive Silicone Sheets [silicone] Other (See Comments)   "Allergic," per [facility]   Other Other (See Comments)   Unknown reaction to Occlusive adhesive- "Allergic," per MAR   Tape Itching, Other (See Comments)   Use Cloth tape only, please      Medication List    STOP taking these medications   furosemide 40 MG tablet Commonly known as:  LASIX     TAKE these medications   acetaminophen 500 MG tablet Commonly known as:  TYLENOL Take 1,000 mg by mouth 3 (three) times daily.   ADULT NUTRITIONAL SUPPLEMENT PO Take by mouth. Magic cup with dinner   atorvastatin 20 MG tablet Commonly known as:  LIPITOR Take 20 mg by mouth daily.   calcium acetate 667 MG capsule Commonly known as:  PHOSLO Take 1,334 mg by mouth 3 (three) times daily with meals.   ceFAZolin 2-4 GM/100ML-% IVPB Commonly known as:  ANCEF 2g given on HD M and W and 3g given on Friday   dicyclomine 20 MG tablet Commonly known as:  BENTYL Take 20 mg by mouth 3 (three) times daily before meals.   doxercalciferol 4 MCG/2ML injection Commonly known as:  HECTOROL Inject 1 mL (2 mcg total) into the vein every Monday, Wednesday, and Friday with hemodialysis.   feeding supplement (PRO-STAT SUGAR FREE 64) Liqd Take 30 mLs by mouth 2 (two) times daily. 9a and 5p   ferrous gluconate 240 (27 FE) MG tablet Commonly known as:  FERGON Take 1 tablet (240 mg total) by mouth 3 (three) times daily with meals.   HYDROcodone-acetaminophen 5-325 MG tablet Commonly known as:  NORCO/VICODIN Take 1 tablet by mouth every 6 (six) hours as needed for severe pain. Prescription needed for SNF placement NCCSR reviewed   loperamide 2 MG tablet Commonly known as:  IMODIUM A-D Take 4 mg by mouth See admin  instructions. Take 2 tablets ( 4 mg) by mouth after 1st loose stool as needed for diarrhea   metoprolol tartrate 25 MG tablet Commonly known as:  LOPRESSOR Take 25 mg by mouth See admin instructions. Take 25 mg by mouth two times a day on Sun/Tues/Thurs/Sat and 25 mg mg at bedtime on Mon/Wed/Fri   midodrine 10 MG tablet Commonly known as:  PROAMATINE Take 10 mg by mouth See admin instructions. Take one tablet (10 mg) by mouth on Monday, Wednesday, Friday 30 minutes prior to dialysis.   multivitamin with minerals Tabs tablet Take 1 tablet by mouth daily.   pantoprazole 40 MG tablet Commonly known as:  PROTONIX Take 1 tablet (40 mg total) by mouth daily.   RENAL VITAMIN PO Take 1 tablet by mouth at bedtime.   warfarin 2.5 MG tablet Commonly known as:  COUMADIN Take 2.5 mg by mouth daily.      Contact information for after-discharge care    Destination    [facility] Preferred SNF .   Service:  Skilled Nursing Contact information: [address]ier[address] [phone]             Allergies  Allergen Reactions  . Occlusive Silicone Sheets [Silicone] Other (See Comments)    "Allergic," per MAR  . Other Other (See Comments)    Unknown reaction to Occlusive adhesive- "Allergic," per MAR  . Tape Itching and Other (See Comments)    Use Cloth tape only, please    Consultations:  Neurosurgery  Nephrology  ID (Dr. Prince Rome)  Procedures/Studies: Dg Chest Port 1 View  Result Date: 05/25/2019 CLINICAL DATA:  Sepsis. EXAM: PORTABLE CHEST 1 VIEW COMPARISON:  Single-view of the chest 02/15/2019 and 10/22/2018. PA and lateral chest 12/27/2018. FINDINGS: Lung volumes are low with some crowding of the bronchovascular structures. Mild vascular congestion is seen. Heart size is upper normal. Aortic atherosclerosis noted. No pneumothorax or pleural effusion. IMPRESSION: Mild pulmonary vascular congestion.  No focal airspace disease.  Atherosclerosis. Electronically Signed   By: Inge Rise M.D.   On: 05/25/2019 15:51   Dg Femur Portable Min 2 Views Right  Result Date: 05/25/2019 CLINICAL DATA:  78 year old male with a history of potential osteomyelitis EXAM: RIGHT FEMUR PORTABLE 2 VIEW COMPARISON:  02/15/2019 FINDINGS: Surgical changes of right above knee amputation. There are no erosive changes at the osteotomy site. There has been development of a small bone spur on the medial aspect of the distal cortex, in the region of the previous MRI findings. Triangular shaped focus of heterotopic bone within the soft tissues on the oblique image. No subcutaneous gas or focal soft tissue swelling identified. Vascular calcifications. IMPRESSION: No erosive changes at the right femur osteotomy site that would suggest acute osteomyelitis. Focal bone spur and heterotopic ossification within the soft tissues, potentially postoperative changes versus chronic reactive changes. Electronically Signed   By: Corrie Mckusick D.O.   On: 05/25/2019 15:54    Subjective: Without complaints  Discharge Exam: Vitals:   05/31/19 1430 05/31/19 1500  BP: (!) 106/55 114/66  Pulse: 94 96  Resp:    Temp:    SpO2:     Vitals:   05/31/19 1330 05/31/19 1400 05/31/19 1430 05/31/19 1500  BP: 120/72 111/60 (!) 106/55 114/66  Pulse: 96 91 94 96  Resp:      Temp:      TempSrc:      SpO2:      Weight:      Height:        General: Pt is alert, awake, not in acute distress Cardiovascular: RRR, S1/S2 +, no rubs, no gallops Respiratory: CTA bilaterally, no wheezing, no rhonchi Abdominal: Soft, NT, ND, bowel sounds + Extremities: s/p LE amputation, no cyanosis   The results of significant diagnostics from this hospitalization (including imaging, microbiology, ancillary and laboratory) are listed below for reference.     Microbiology: Recent Results (from the past 240 hour(s))  Blood Culture (routine x 2)     Status: None   Collection Time:  05/25/19  2:24 PM  Result Value Ref Range Status   Specimen Description BLOOD RIGHT ANTECUBITAL  Final   Special Requests   Final    BOTTLES DRAWN AEROBIC AND ANAEROBIC Blood Culture adequate volume   Culture   Final    NO GROWTH 5 DAYS Performed at [facility], [address], [place], [place] [address]    Report Status 05/30/2019 FINAL  Final  Blood Culture (routine x 2)     Status: None   Collection Time: 05/25/19  2:45 PM  Result Value Ref Range Status   Specimen Description BLOOD SITE NOT SPECIFIED  Final   Special Requests   Final    BOTTLES DRAWN AEROBIC ONLY Blood Culture adequate volume   Culture   Final    NO GROWTH 5 DAYS Performed at [facility], [address], [place], [place] [address]    Report Status 05/30/2019 FINAL  Final  SARS Coronavirus 2 (CEPHEID - Performed in  [facility] hospital lab), Hosp Order     Status: None   Collection Time: 05/25/19  3:03 PM  Result Value Ref Range Status   SARS Coronavirus 2 NEGATIVE NEGATIVE Final    Comment: (NOTE) If result is NEGATIVE SARS-CoV-2 target nucleic acids are NOT DETECTED. The SARS-CoV-2 RNA is generally detectable in upper and lower  respiratory specimens during the acute phase of infection. The lowest  concentration of SARS-CoV-2 viral copies this assay can detect is 250  copies / mL. A negative result does not preclude SARS-CoV-2 infection  and should not be used as the sole basis for treatment or other  patient management decisions.  A negative result may occur with  improper specimen collection / handling, submission of specimen other  than nasopharyngeal swab, presence of viral mutation(s) within the  areas targeted by this assay, and inadequate number of viral copies  (<250 copies / mL). A negative result must be combined with clinical  observations, patient history, and epidemiological information. If result is POSITIVE SARS-CoV-2 target nucleic acids are DETECTED. The SARS-CoV-2 RNA is  generally detectable in upper and lower  respiratory specimens dur ing the acute phase of infection.  Positive  results are indicative of active infection with SARS-CoV-2.  Clinical  correlation with patient history and other diagnostic information is  necessary to determine patient infection status.  Positive results do  not rule out bacterial infection or co-infection with other viruses. If result is PRESUMPTIVE POSTIVE SARS-CoV-2 nucleic acids MAY BE PRESENT.   A presumptive positive result was obtained on the submitted specimen  and confirmed on repeat testing.  While 2019 novel coronavirus  (SARS-CoV-2) nucleic acids may be present in the submitted sample  additional confirmatory testing may be necessary for epidemiological  and / or clinical management purposes  to differentiate between  SARS-CoV-2 and other Sarbecovirus currently known to infect humans.  If clinically indicated additional testing with an alternate test  methodology [phone]) is advised. The SARS-CoV-2 RNA is generally  detectable in upper and lower respiratory sp ecimens during the acute  phase of infection. The expected result is Negative. Fact Sheet for Patients:  StrictlyIdeas.no Fact Sheet for Healthcare Providers: BankingDealers.co.za This test is not yet approved or cleared by the [place] FDA and has been authorized for detection and/or diagnosis of SARS-CoV-2 by FDA under an Emergency Use Authorization (EUA).  This EUA will remain in effect (meaning this test can be used) for the duration of the COVID-19 declaration under Section 564(b)(1) of the Act, 21 U.S.C. section 360bbb-3(b)(1), unless the authorization is terminated or revoked sooner. Performed at [facility], [place] [address], [place], [place] [address]   MRSA PCR Screening     Status: Abnormal   Collection Time: 05/26/19  1:54 PM  Result Value Ref Range Status   MRSA by PCR POSITIVE  (A) NEGATIVE Final    Comment:        The GeneXpert MRSA Assay (FDA approved for NASAL specimens only), is one component of a comprehensive MRSA colonization surveillance program. It is not intended to diagnose MRSA infection nor to guide or monitor treatment for MRSA infections. RESULT CALLED TO, READ BACK BY AND VERIFIED WITH: Sherlyn Lick RN 17:25 05/26/19 ([email or web address]) Performed at [facility], [place] [address], [place], [place] [address]      Labs: BNP (last 3 results) Recent Labs    11/06/18 1905  BNP 4,782.9*   Basic Metabolic Panel: Recent Labs  Lab 05/26/19 0214  05/27/19 0348 05/28/19 0253 05/29/19 0541 05/29/19 2316 05/30/19 0727 05/31/19 1319  NA 140 138 137  --   --  137 136  K 4.2 3.8 3.7  --   --  5.1 3.6  CL 101 97* 102  --   --  101 98  CO2 21* 23 21*  --   --  17* 24  GLUCOSE 127* 117* 151*  --   --  99 140*  BUN 59* 51* 80*  --   --  158* 44*  CREATININE 4.98* 4.29* 6.12*  --   --  8.32* 4.29*  CALCIUM 9.5 9.3 9.3  --   --  10.0 9.1  MG 1.9 1.8 2.0 2.1 2.4  --   --   PHOS  --   --   --   --   --  2.6 3.0   Liver Function Tests: Recent Labs  Lab 05/25/19 1421 05/26/19 0214 05/27/19 0348 05/28/19 0253 05/30/19 0727 05/31/19 1319  AST 77* 49* 55* 31  --   --   ALT 58* 44 55* 42  --   --   ALKPHOS 288* 235* 272* 261*  --   --   BILITOT 1.2 0.7 0.8 0.6  --   --   PROT 8.2* 6.8 7.0 6.5  --   --   ALBUMIN 2.9* 2.4* 2.4* 2.2* 2.3* 2.3*   No results for input(s): LIPASE, AMYLASE in the last 168 hours. No results for input(s): AMMONIA in the last 168 hours. CBC: Recent Labs  Lab 05/25/19 1421  05/28/19 0253 05/29/19 0541 05/29/19 2316 05/30/19 0727 05/31/19 1319  WBC 17.5*   < > 16.4* 16.6* 15.8* 14.6* 10.3  NEUTROABS 14.2*  --   --   --   --   --   --   HGB 10.7*   < > 7.8* 6.2* 6.8* 7.4* 7.6*  HCT 35.1*   < > 25.5* 20.4* 21.8* 23.1* 24.7*  MCV 89.1   < > 87.3 87.2 86.2 85.9 88.5  PLT 251   < > 204 188 206 192 220   < > = values in  this interval not displayed.   Cardiac Enzymes: No results for input(s): CKTOTAL, CKMB, CKMBINDEX, TROPONINI in the last 168 hours. BNP: Invalid input(s): POCBNP CBG: Recent Labs  Lab 05/30/19 0802 05/30/19 1354 05/30/19 1714 05/30/19 2213 05/31/19 0821  GLUCAP 92 84 81 83 83   D-Dimer No results for input(s): DDIMER in the last 72 hours. Hgb A1c No results for input(s): HGBA1C in the last 72 hours. Lipid Profile No results for input(s): CHOL, HDL, LDLCALC, TRIG, CHOLHDL, LDLDIRECT in the last 72 hours. Thyroid function studies No results for input(s): TSH, T4TOTAL, T3FREE, THYROIDAB in the last 72 hours.  Invalid input(s): FREET3 Anemia work up No results for input(s): VITAMINB12, FOLATE, FERRITIN, TIBC, IRON, RETICCTPCT in the last 72 hours. Urinalysis    Component Value Date/Time   COLORURINE YELLOW 10/20/2008 [place] 10/20/2008 0942   LABSPEC 1.011 10/20/2008 0942   PHURINE 6.0 10/20/2008 0942   GLUCOSEU NEGATIVE 10/20/2008 0942   HGBUR LARGE (A) 10/20/2008 0942   BILIRUBINUR NEGATIVE 10/20/2008 0942   KETONESUR NEGATIVE 10/20/2008 0942   PROTEINUR 100 (A) 10/20/2008 0942   UROBILINOGEN 1.0 10/20/2008 0942   NITRITE NEGATIVE 10/20/2008 0942   LEUKOCYTESUR SMALL (A) 10/20/2008 0942   Sepsis Labs Invalid input(s): PROCALCITONIN,  WBC,  LACTICIDVEN Microbiology Recent Results (from the past 240 hour(s))  Blood Culture (routine x  2)     Status: None   Collection Time: 05/25/19  2:24 PM  Result Value Ref Range Status   Specimen Description BLOOD RIGHT ANTECUBITAL  Final   Special Requests   Final    BOTTLES DRAWN AEROBIC AND ANAEROBIC Blood Culture adequate volume   Culture   Final    NO GROWTH 5 DAYS Performed at [facility], [address] [place], [place] [address]    Report Status 05/30/2019 FINAL  Final  Blood Culture (routine x 2)     Status: None   Collection Time: 05/25/19  2:45 PM  Result Value Ref Range Status   Specimen  Description BLOOD SITE NOT SPECIFIED  Final   Special Requests   Final    BOTTLES DRAWN AEROBIC ONLY Blood Culture adequate volume   Culture   Final    NO GROWTH 5 DAYS Performed at [facility], [address], [place], [place] [address]    Report Status 05/30/2019 FINAL  Final  SARS Coronavirus 2 (CEPHEID - Performed in [facility] hospital lab), Hosp Order     Status: None   Collection Time: 05/25/19  3:03 PM  Result Value Ref Range Status   SARS Coronavirus 2 NEGATIVE NEGATIVE Final    Comment: (NOTE) If result is NEGATIVE SARS-CoV-2 target nucleic acids are NOT DETECTED. The SARS-CoV-2 RNA is generally detectable in upper and lower  respiratory specimens during the acute phase of infection. The lowest  concentration of SARS-CoV-2 viral copies this assay can detect is 250  copies / mL. A negative result does not preclude SARS-CoV-2 infection  and should not be used as the sole basis for treatment or other  patient management decisions.  A negative result may occur with  improper specimen collection / handling, submission of specimen other  than nasopharyngeal swab, presence of viral mutation(s) within the  areas targeted by this assay, and inadequate number of viral copies  (<250 copies / mL). A negative result must be combined with clinical  observations, patient history, and epidemiological information. If result is POSITIVE SARS-CoV-2 target nucleic acids are DETECTED. The SARS-CoV-2 RNA is generally detectable in upper and lower  respiratory specimens dur ing the acute phase of infection.  Positive  results are indicative of active infection with SARS-CoV-2.  Clinical  correlation with patient history and other diagnostic information is  necessary to determine patient infection status.  Positive results do  not rule out bacterial infection or co-infection with other viruses. If result is PRESUMPTIVE POSTIVE SARS-CoV-2 nucleic acids MAY BE PRESENT.   A presumptive  positive result was obtained on the submitted specimen  and confirmed on repeat testing.  While 2019 novel coronavirus  (SARS-CoV-2) nucleic acids may be present in the submitted sample  additional confirmatory testing may be necessary for epidemiological  and / or clinical management purposes  to differentiate between  SARS-CoV-2 and other Sarbecovirus currently known to infect humans.  If clinically indicated additional testing with an alternate test  methodology [phone]) is advised. The SARS-CoV-2 RNA is generally  detectable in upper and lower respiratory sp ecimens during the acute  phase of infection. The expected result is Negative. Fact Sheet for Patients:  StrictlyIdeas.no Fact Sheet for Healthcare Providers: BankingDealers.co.za This test is not yet approved or cleared by the [place] FDA and has been authorized for detection and/or diagnosis of SARS-CoV-2 by FDA under an Emergency Use Authorization (EUA).  This EUA will remain in effect (meaning this test can  be used) for the duration of the COVID-19 declaration under Section 564(b)(1) of the Act, 21 U.S.C. section 360bbb-3(b)(1), unless the authorization is terminated or revoked sooner. Performed at [facility], [place] [address], [place], [place] [address]   MRSA PCR Screening     Status: Abnormal   Collection Time: 05/26/19  1:54 PM  Result Value Ref Range Status   MRSA by PCR POSITIVE (A) NEGATIVE Final    Comment:        The GeneXpert MRSA Assay (FDA approved for NASAL specimens only), is one component of a comprehensive MRSA colonization surveillance program. It is not intended to diagnose MRSA infection nor to guide or monitor treatment for MRSA infections. RESULT CALLED TO, READ BACK BY AND VERIFIED WITH: Sherlyn Lick RN 17:25 05/26/19 ([email or web address]) Performed at [facility], [place] [address] [place], [place] [address]    Time spent: 30  min  SIGNED:   Marylu Lund, MD  [facility] 05/31/2019, 3:17 PM  If 7PM-7AM, please contact night-coverage

## 2019-05-31 NOTE — TOC Transition Note (Signed)
Transition of Care [facility]) - CM/SW Discharge Note   Patient Details  Name: [name] MRN: [id number] Date of Birth: [birth year]  Transition of Care [facility]) CM/SW Contact:  Benard Halsted, [place] Phone Number: 05/31/2019, 4:27 PM   Clinical Narrative:    Patient will DC to: [facility] Anticipated DC date: 05/31/19 Family notified: Son in Systems developer by: Corey Harold   Per MD patient ready for DC to [facility]. RN, patient, patient's family, and facility notified of DC. Discharge Summary and FL2 sent to facility. RN to call report prior to discharge [phone]). DC packet on chart. Ambulance transport requested for patient.   CSW will sign off for now as social work intervention is no longer needed. Please consult [place] again if new needs arise.  Cedric Fishman, LCSW Clinical Social Worker [phone]    Final next level of care: Skilled Nursing Facility Barriers to Discharge: No Barriers Identified   Patient Goals and CMS Choice Patient states their goals for this hospitalization and ongoing recovery are:: Return to SNF CMS Medicare.gov Compare Post Acute Care list provided to:: (NA) Choice offered to / list presented to : NA  Discharge Placement              Patient [facility] bed at: [facility] Patient to be transferred to facility by: [place] Name of family member notified: Left vm for Daughter, spoke with daughter's husband who will let her know since she is at work Patient and family notified of of transfer: 05/31/19  Discharge Plan and Services In-house Referral: Clinical Social Work Discharge Planning Services: NA Post Acute Care Choice: [place]          DME Arranged: N/A DME Agency: NA       HH Arranged: NA HH Agency: NA        Social Determinants of Health ([place]) Interventions     Readmission Risk Interventions No flowsheet data found.

## 2019-05-31 NOTE — TOC Progression Note (Signed)
Transition of Care [facility]) - Progression Note    Patient Details  Name: CHAU SAWIN MRN: [id number] Date of Birth: [birth year]  [facility]) CM/SW [place], LCSW Phone Number: 05/31/2019, 4:10 PM  Clinical Narrative:    Pas[place]: [phone] [id number]   Expected Discharge Plan: [place] Barriers to Discharge: No Barriers Identified  Expected Discharge Plan and Services Expected Discharge Plan: [place] In-house Referral: Clinical Social Work Discharge Planning Services: NA Post Acute Care Choice: [place] Living arrangements for the past 2 months: [place] Expected Discharge Date: 05/31/19               DME Arranged: N/A DME Agency: NA       HH Arranged: NA HH Agency: NA         Social Determinants of Health (SDOH) Interventions    Readmission Risk Interventions No flowsheet data found.

## 2019-05-31 NOTE — Procedures (Signed)
Patient seen on Hemodialysis. BP (!) 89/55   Pulse 87   Temp 98.5 F (36.9 C) (Oral)   Resp (!) 21   Ht 5\' 6"  (1.676 m)   Wt 56 kg   SpO2 98%   BMI 19.93 kg/m   QB 400, UF goal 1L Tolerating treatment without complaints at this time.   Elmarie Shiley MD [facility]. Office # [phone] Pager # [phone] 12:22 PM

## 2019-05-31 NOTE — NC FL2 (Signed)
[place] LEVEL OF CARE SCREENING TOOL     IDENTIFICATION  Patient Name: [name] Birthdate: April 07, 1941 Sex: male Admission Date (Current Location): 05/25/2019  [facility] and [place] Number:  Herbalist and Address:  [facility] [place], [place] [address] [place], [place] [address]      Provider Number: [id number]  Attending Physician Name and Address:  Donne Hazel, MD  Relative Name and Phone Number:  [name], daughter, [phone]    Current Level of Care: Hospital Recommended Level of Care: [place] Prior Approval Number:    Date Approved/Denied:   PASRR Number:    Discharge Plan: SNF    Current Diagnoses: Patient Active Problem List   Diagnosis Date Noted  . 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 Anemia due to end stage renal disease ([place]) 03/24/2019  . Protein-calorie malnutrition, severe ([place]) 03/24/2019  . Hyperlipidemia associated with type 2 diabetes mellitus ([place]) 02/22/2019  . Amputation stump infection ([place]) 02/15/2019  . Conjunctivitis of left eye 01/04/2019  . 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[place]) 12/30/2018  . Open wound of left foot   . Anticoagulation management encounter   . OSA (obstructive sleep apnea)   . HTN (hypertension)   . ESRD on hemodialysis ([place])   . BPH (benign prostatic hyperplasia)   . Hypotension 12/07/2018  . GERD (gastroesophageal reflux disease) 12/07/2018  . Pressure injury of skin 12/03/2018  . Symptomatic anemia 12/02/2018  . Acute on chronic blood loss anemia 12/02/2018  . 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[place]) 12/02/2018  . Lower GI bleeding 12/02/2018  . Heme positive stool   .  Acute on chronic diastolic (congestive) heart failure ([place]) 11/19/2018  . Acute CVA (cerebrovascular accident) ([place]) 11/19/2018  . Hypoglycemia 11/19/2018  . Peripheral vascular disease due to secondary diabetes ([place]) 11/19/2018  . Dry gangrene ([place]) 11/19/2018  . 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 DNR (do not resuscitate) discussion   . Goals of care, counseling/discussion   . Palliative care by specialist   . Acute hypoxemic respiratory failure ([place]) 11/06/2018  . Volume overload 11/06/2018  . Multifocal pneumonia 11/06/2018  . Acute metabolic encephalopathy 79/48/0165  . Physical deconditioning 11/06/2018  . Acute respiratory failure with hypoxia and hypercapnia ([place]) 11/04/2018  . CAP (community acquired pneumonia) 11/04/2018  . Pleural effusion, right 11/04/2018  . Increased ammonia level 11/04/2018  . Abnormal CT of the abdomen 11/04/2018  . Atrial fibrillation with RVR ([place]) 11/04/2018  . Sepsis ([place]) 10/21/2018  . Acute encephalopathy 10/21/2018  . Elevated alkaline phosphatase level 10/21/2018  . Chronic anemia 10/21/2018  . Thrombocytopenia ([place]) 10/21/2018  . Pulmonary HTN ([place]) 03/04/2017  . Aortic valve stenosis 03/04/2017  . Hypertension, accelerated with heart disease, without CHF 01/12/2017  . Dyspnea on exertion 01/12/2017  . ESRD (end stage renal disease) ([place]) 06/07/2012  . Hyperlipidemia 04/24/2011  . HYPERTENSION, BENIGN 10/24/2009  . Type 2 diabetes mellitus with hypertension and end stage renal disease on dialysis ([place]) 10/23/2009  . OBSTRUCTIVE SLEEP APNEA 10/23/2009  . PAF (paroxysmal atrial fibrillation) ([place]) 10/23/2009  . CHF (congestive heart failure) ([place]) 10/23/2009    Orientation RESPIRATION BLADDER Height & Weight     Self,  Place  Normal Continent Weight: 123 lb 7.3 oz (56 kg) Height:  5\' 6"  (167.6 cm)  BEHAVIORAL SYMPTOMS/MOOD NEUROLOGICAL BOWEL NUTRITION STATUS      Incontinent Diet(Please see DC Summary)   AMBULATORY STATUS COMMUNICATION OF NEEDS Skin   Extensive Assist Verbally PU Stage and Appropriate Care, Surgical wounds(Stage II on sacrum; incision on knee)                       Personal Care Assistance Level of Assistance  Bathing, Feeding, Dressing Bathing Assistance: Maximum assistance Feeding assistance: Limited assistance Dressing Assistance: Limited assistance     Functional Limitations Info  Sight, Speech, Hearing Sight Info: Adequate Hearing Info: Adequate Speech Info: Adequate    SPECIAL CARE FACTORS FREQUENCY                       Contractures Contractures Info: Not present    Additional Factors Info  Code Status, Allergies, Isolation Precautions Code Status Info: Full Allergies Info: Occlusive Silicone Sheets Silicone, Other, Tape     Isolation Precautions Info: MRSA     Current Medications (05/31/2019):  This is the current hospital active medication list Current Facility-Administered Medications  Medication Dose Route Frequency Provider Last Rate Last Dose  . 0.9 %  sodium chloride infusion (Manually program via Guardrails IV Fluids)   Intravenous Once Barton Dubois, MD      . acetaminophen (TYLENOL) tablet 650 mg  650 mg Oral Q6H PRN Amin, Jeanella Flattery, MD       Or  . acetaminophen (TYLENOL) suppository 650 mg  650 mg Rectal Q6H PRN Amin, Ankit Chirag, MD      . calcium acetate (PHOSLO) capsule 667 mg  667 mg Oral TID WC Alric Seton, PA-C      . [START ON 06/02/2019] ceFAZolin (ANCEF) 3 g in dextrose 5 % 50 mL IVPB  3 g Intravenous Q Fri-1800 Barton Dubois, MD      . ceFAZolin (ANCEF) IVPB 2g/100 mL premix  2 g Intravenous Once per day on Mon Wed [place], Clifton James, MD      . Chlorhexidine Gluconate Cloth 2 % PADS 6 each  6 each Topical Q0600 Lynnda Child, PA-C   6 each at 05/31/19 0554  . [START ON 06/05/2019] Darbepoetin Alfa (ARANESP) injection 150 mcg  150 mcg Intravenous Q Mon-HD Alric Seton, PA-C      . dicyclomine (BENTYL)  tablet 20 mg  20 mg Oral TID AC Amin, Ankit Chirag, MD   20 mg at 05/31/19 0946  . feeding supplement (NEPRO CARB STEADY) liquid 237 mL  237 mL Oral BID BM Alric Seton, PA-C   237 mL at 05/31/19 0946  . feeding supplement (PRO-STAT SUGAR FREE 64) liquid 30 mL  30 mL Oral BID Amin, Ankit Chirag, MD   30 mL at 05/31/19 0946  . hydrALAZINE (APRESOLINE) injection 10 mg  10 mg Intravenous Q4H PRN Amin, Ankit Chirag, MD      . HYDROcodone-acetaminophen (NORCO/VICODIN) 5-325 MG per tablet 1 tablet  1 tablet Oral Q6H PRN Barton Dubois, MD      . insulin aspart (novoLOG) injection 0-9 Units  0-9 Units Subcutaneous TID WC Amin, Ankit Chirag, MD   1 Units at 05/29/19 1229  . metoprolol tartrate (LOPRESSOR) tablet 25 mg  25 mg Oral Q M,W,F-2000 Amin, Ankit Chirag, MD   25 mg at 05/29/19 2252  . metoprolol tartrate (LOPRESSOR) tablet 25 mg  25 mg Oral 2 times  per day on Sun Tue Thu Sat Damita Lack, MD   25 mg at 05/30/19 2030  . midodrine (PROAMATINE) tablet 10 mg  10 mg Oral Q M,W,F-HD Amin, Ankit Chirag, MD   10 mg at 05/31/19 1155  . multivitamin (RENA-VIT) tablet 1 tablet  1 tablet Oral QHS Skeet Simmer, [facility]   1 tablet at 05/30/19 2122  . mupirocin ointment (BACTROBAN) 2 % 1 application  1 application Nasal BID Barton Dubois, MD   1 application at 25/85/27 [phone]  . ondansetron (ZOFRAN) tablet 4 mg  4 mg Oral Q6H PRN Amin, Ankit Chirag, MD       Or  . ondansetron (ZOFRAN) injection 4 mg  4 mg Intravenous Q6H PRN Amin, Ankit Chirag, MD      . pantoprazole (PROTONIX) EC tablet 40 mg  40 mg Oral Daily Amin, Ankit Chirag, MD   40 mg at 05/31/19 0946  . polyethylene glycol (MIRALAX / GLYCOLAX) packet 17 g  17 g Oral Daily PRN Amin, Ankit Chirag, MD      . senna-docusate (Senokot-S) tablet 1 tablet  1 tablet Oral QHS PRN Amin, Ankit Chirag, MD      . warfarin (COUMADIN) tablet 2.5 mg  2.5 mg Oral ONCE-1800 Rolla Flatten, [facility]      . Warfarin - Pharmacist Dosing Inpatient   Does not apply q1800  Skeet Simmer, [facility]         Discharge Medications: Please see discharge summary for a list of discharge medications.  Relevant Imaging Results:  Relevant Lab Results:   Additional Information SSN: [id number]   COVID negative on 5/28     Will get IV abx at Dialysis.   Benard Halsted, LCSW

## 2019-05-31 NOTE — Progress Notes (Signed)
[facility] Progress Note   Subjective:  Seen in room - no c/o except wants to be repositioned in bed  Objective Vitals:   05/30/19 1640 05/30/19 2209 05/31/19 0500 05/31/19 0552  BP: 125/69 106/64  (!) 102/55  Pulse: 99 88  87  Resp: (!) 21     Temp: 98 F (36.7 C) (!) 97.4 F (36.3 C)  98.5 F (36.9 C)  TempSrc: Oral Oral  Oral  SpO2: 98% 95%  98%  Weight:   56 kg   Height:       Physical Exam goal 2.5 -  General: Frail man, NAD. Awake lying crossways in bed Heart: RRR; 3/6 systolic murmur Lungs: CTAB Abdomen: soft, non-tender Extremities: B AKAs - R AKA  Medial dressing no edema. Dialysis Access: LUE AVF + thrill   Additional Objective Labs: Basic Metabolic Panel: Recent Labs  Lab 05/27/19 0348 05/28/19 0253 05/30/19 0727  NA 138 137 137  K 3.8 3.7 5.1  CL 97* 102 101  CO2 23 21* 17*  GLUCOSE 117* 151* 99  BUN 51* 80* 158*  CREATININE 4.29* 6.12* 8.32*  CALCIUM 9.3 9.3 10.0  PHOS  --   --  2.6   Liver Function Tests: Recent Labs  Lab 05/26/19 0214 05/27/19 0348 05/28/19 0253 05/30/19 0727  AST 49* 55* 31  --   ALT 44 55* 42  --   ALKPHOS 235* 272* 261*  --   BILITOT 0.7 0.8 0.6  --   PROT 6.8 7.0 6.5  --   ALBUMIN 2.4* 2.4* 2.2* 2.3*   CBC: Recent Labs  Lab 05/25/19 1421  05/27/19 0348 05/28/19 0253 05/29/19 0541 05/29/19 2316 05/30/19 0727  WBC 17.5*   < > 17.6* 16.4* 16.6* 15.8* 14.6*  NEUTROABS 14.2*  --   --   --   --   --   --   HGB 10.7*   < > 8.3* 7.8* 6.2* 6.8* 7.4*  HCT 35.1*   < > 27.3* 25.5* 20.4* 21.8* 23.1*  MCV 89.1   < > 88.6 87.3 87.2 86.2 85.9  PLT 251   < > 228 204 188 206 192   < > = values in this interval not displayed.   Blood Culture    Component Value Date/Time   SDES BLOOD SITE NOT SPECIFIED 05/25/2019 1445   SPECREQUEST  05/25/2019 1445    BOTTLES DRAWN AEROBIC ONLY Blood Culture adequate volume   CULT  05/25/2019 1445    NO GROWTH 5 DAYS Performed at [facility], [place] [address] [place], [place] [address]    REPTSTATUS 05/30/2019 FINAL 05/25/2019 1445   Medications: . [START ON 06/02/2019]  ceFAZolin (ANCEF) IV    .  ceFAZolin (ANCEF) IV     . sodium chloride   Intravenous Once  . calcium acetate  1,334 mg Oral TID WC  . Chlorhexidine Gluconate Cloth  6 each Topical Q0600  . dicyclomine  20 mg Oral TID AC  . feeding supplement (NEPRO CARB STEADY)  237 mL Oral BID BM  . feeding supplement (PRO-STAT SUGAR FREE 64)  30 mL Oral BID  . insulin aspart  0-9 Units Subcutaneous TID WC  . metoprolol tartrate  25 mg Oral Q M,W,F-2000  . metoprolol tartrate  25 mg Oral 2 times per day on Sun Tue Thu Sat  . midodrine  10 mg Oral Q M,W,F-HD  . multivitamin  1 tablet Oral QHS  . mupirocin ointment  1 application Nasal  BID  . pantoprazole  40 mg Oral Daily  . Warfarin - Pharmacist Dosing Inpatient   Does not apply q1800    Outpatient Dialysis Orders: AF MWF  4h 400/1.5x EDW 53kg 3K/2.25Ca Na Linear UFP 2 L AVF No heparin  Mircera 150 q 2 weeks (last 5/25) No VDRA  Assessment/Plan: 1. Sepsis/Fever:Felt to be related to R stump. Femur xray without signs of osteo. WBC ^ - Started on empiric Vanc/Cefepime/Flagyl. BCx 5/28 negative so far. 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 VVS following - no plans for surgical intervention at present. RN note also indicates 2 sacral wounds  2. AMS: Improving today, likely related to #1. Dementia at baseline per notes. 3. ESRD:MWF - HD bumped to Tuesday due to volume - next HD today shorter run to keep on schedule redraw labs pre HD 4. Chronic hypotension/volume: On midodrine. Admit CXR with some pulm edema, 2L off w/ last Friday - 1 L Tuesday - > 54.5 bed weight 5. Anemia: Hgb 7.8 > 6.2  > 7.4 pre HD Tues.-s/p 1 unit PRBC 6/2 - repeat CBC today- Mircera due next week  repletion for now ferritin 1445 likely inflammatory. FOBT 6/1 + on PPI 6. Metabolic bone disease -Corr Ca ^. P 2s - reduce Ca acetate for now to 1 ac No VDRA.   7. Nutrition: Alb low, continue pro-stat, add nepro - intake marginal hadn't eaten an breakfast 8. Afib: warfarin resumed BB  Amalia Hailey, PA-C 05/31/2019, 9:38 AM  [facility] Pager: [phone]

## 2019-06-01 ENCOUNTER — Non-Acute Institutional Stay (SKILLED_NURSING_FACILITY): Payer: Medicare Other | Admitting: Internal Medicine

## 2019-06-01 ENCOUNTER — Other Ambulatory Visit: Payer: Self-pay | Admitting: Internal Medicine

## 2019-06-01 ENCOUNTER — Encounter: Payer: Self-pay | Admitting: Internal Medicine

## 2019-06-01 DIAGNOSIS — A419 Sepsis, unspecified organism: Secondary | ICD-10-CM | POA: Diagnosis not present

## 2019-06-01 DIAGNOSIS — K219 Gastro-esophageal reflux disease without esophagitis: Secondary | ICD-10-CM

## 2019-06-01 DIAGNOSIS — I132 Hypertensive heart and chronic kidney disease with heart failure and with stage 5 chronic kidney disease, or end stage renal disease: Secondary | ICD-10-CM

## 2019-06-01 DIAGNOSIS — N186 End stage renal disease: Secondary | ICD-10-CM

## 2019-06-01 DIAGNOSIS — I5032 Chronic diastolic (congestive) heart failure: Secondary | ICD-10-CM

## 2019-06-01 DIAGNOSIS — I480 Paroxysmal atrial fibrillation: Secondary | ICD-10-CM

## 2019-06-01 DIAGNOSIS — Z992 Dependence on renal dialysis: Secondary | ICD-10-CM

## 2019-06-01 DIAGNOSIS — E1169 Type 2 diabetes mellitus with other specified complication: Secondary | ICD-10-CM

## 2019-06-01 DIAGNOSIS — E785 Hyperlipidemia, unspecified: Secondary | ICD-10-CM

## 2019-06-01 MED ORDER — HYDROCODONE-ACETAMINOPHEN 5-325 MG PO TABS: 1.0000 | ORAL_TABLET | Freq: Four times a day (QID) | ORAL | 0 refills | Status: DC | PRN

## 2019-06-01 NOTE — Progress Notes (Signed)
: Provider:  Noah Delaine. Damara Klunder MD. Location:  [place] Room Number: 628/B Place of Service:  SNF ([phone])  PCP: Hennie Duos, MD Patient Care Team: Hennie Duos, MD as PCP - General (Internal Medicine) [name], MD as Consulting Physician (Nephrology) Rehab, [facility], [facility]  Extended Emergency Contact Information Primary Emergency Contact: [name],[name] Address: [place],  [address] [name] [place] Phone: [phone] Work Phone: [phone] Mobile Phone: [phone] Relation: Daughter Secondary Emergency Contact: [name] Mobile Phone: [phone] Relation: Other     Allergies: Occlusive silicone sheets [silicone]; Other; and Tape  Chief Complaint  Patient presents with   Readmit To SNF    Readmission Visit    HPI: Patient is 78 y.o. 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  SNF wound care nurse had called vascular vein specialist office with concerns of some drainage from the right above-knee amputation suture line and the vascular clinic had advised her to send the patient to the ED for for further evaluation..  In the ED patient was noted to be slightly confused with legs suggestive of lactic acidosis transaminitis and leukocytosis as well as concerns for sepsis.  Chest x-ray showed slight cardiac vascular congestion.  Patient was evaluated by vascular surgery who recommended medical management for infectious etiology with with no acute indications for surgical intervention.  Patient was admitted to [facility] from 5/28- 6/3 where he was treated for sepsis with broad-spectrum IV antibiotics  which were narrowed to Ancef.  WBCs began to be trending down, x-ray of the femur demonstrated no erosive changes suggestive of osteomyelitis.  Patient's chronic problems of diabetes mellitus type 2, hypertension, and paroxysmal atrial fibrillation remained stable.  Patient is admitted back to skilled nursing facility for IV antibiotics Ancef through 06/08/2019 and for residential care.  While at skilled nursing facility patient will be followed for paroxysmal atrial fibrillation treated with metoprolol and warfarin, hyperlipidemia treated with Lipitor and GERD treated with Protonix.  Past Medical History:  Diagnosis Date   A-fib ([place])    Anemia    Blood transfusion    BPH (benign prostatic hyperplasia)    CHF (congestive heart failure) (HCC)    Diarrhea    DM (diabetes mellitus) ([place])    ESRD on hemodialysis ([place])    Started dialysis in 2009   History of GI bleed    secondary to coumadin   HTN (hypertension)    Hyperlipidemia    OSA (obstructive sleep apnea)    uses CPAP   Secondary hyperparathyroidism of renal origin [facility])     Past Surgical History:  Procedure Laterality Date   ABDOMINAL AORTOGRAM N/A 11/15/2018   Procedure: ABDOMINAL AORTOGRAM;  Surgeon: [name], MD;  Location: [place] CV LAB;  Service: Cardiovascular;  Laterality: N/A;   AMPUTATION Left 12/06/2018   Procedure: AMPUTATION DIGIT LEFT FIFTH TOE;  Surgeon: Angelia Mould, MD;  Location: [place];  Service: Vascular;  Laterality: Left;   AMPUTATION Bilateral 12/29/2018   Procedure: AMPUTATION ABOVE KNEE;  Surgeon: Waynetta Sandy, MD;  Location: [place];  Service: Vascular;  Laterality: Bilateral;   APPLICATION OF WOUND VAC Right 02/17/2019   Procedure: Application Of  Wound Vac;  Surgeon: Marty Heck, MD;  Location: [place];  Service: Vascular;  Laterality: Right;   BVT  5/46/50   Left  Basilic Vein Transposition   CHOLECYSTECTOMY     EYE SURGERY     Catarct bil    I&D EXTREMITY Right 02/17/2019   Procedure: Right above the kneee debridement;  Surgeon: Marty Heck, MD;  Location: [place];  Service: Vascular;  Laterality: Right;   INSERTION OF DIALYSIS CATHETER  05/28/2012   Procedure: INSERTION OF DIALYSIS CATHETER;  Surgeon: Mal Misty, MD;  Location: [facility] OR;  Service: Vascular;  Laterality: Right;   Left arm shuntogram.     Left forearm loop graft with 6 mm Gore-Tex graft.     LOWER EXTREMITY ANGIOGRAPHY Bilateral 11/15/2018   Procedure: LOWER EXTREMITY ANGIOGRAPHY;  Surgeon: [name], MD;  Location: [place] [facility];  Service: Cardiovascular;  Laterality: Bilateral;   Pars plana vitrectomy with 25-gauge system     PERIPHERAL VASCULAR ATHERECTOMY Left 11/15/2018   Procedure: PERIPHERAL VASCULAR ATHERECTOMY;  Surgeon: [name], MD;  Location: [place] [facility];  Service: Cardiovascular;  Laterality: Left;  SFA with STENT   PERIPHERAL VASCULAR BALLOON ANGIOPLASTY Left 11/15/2018   Procedure: PERIPHERAL VASCULAR BALLOON ANGIOPLASTY;  Surgeon: [name], MD;  Location: [place] [facility];  Service: Cardiovascular;  Laterality: Left;  PT    Allergies as of 06/01/2019      Reactions   Occlusive Silicone Sheets [silicone] Other (See Comments)   "Allergic," per [facility]   Other Other (See Comments)   Unknown reaction to Occlusive adhesive- "Allergic," per [facility]   Tape Itching, Other (See Comments)   Use Cloth tape only, please      Medication List       Accurate as of June 01, 2019 10:41 AM. If you have any questions, ask your nurse or doctor.        acetaminophen 500 MG tablet Commonly known as:  TYLENOL Take 1,000 mg by mouth 3 (three) times daily.   ADULT NUTRITIONAL SUPPLEMENT PO Take by mouth. Magic cup with dinner   atorvastatin 20 MG tablet Commonly known as:  LIPITOR Take 20 mg by mouth daily.   calcium acetate 667 MG capsule Commonly known as:  PHOSLO Take 1,334 mg by mouth 3 (three) times daily with  meals.   ceFAZolin 2-4 GM/100ML-% IVPB Commonly known as:  ANCEF 2g given on HD M and W and 3g given on Friday   dicyclomine 20 MG tablet Commonly known as:  BENTYL Take 20 mg by mouth 3 (three) times daily before meals.   doxercalciferol 4 MCG/2ML injection Commonly known as:  HECTOROL Inject 1 mL (2 mcg total) into the vein every Monday, Wednesday, and Friday with hemodialysis.   feeding supplement (PRO-STAT SUGAR FREE 64) Liqd Take 30 mLs by mouth 2 (two) times daily. 9a and 5p   ferrous gluconate 240 (27 FE) MG tablet Commonly known as:  FERGON Take 1 tablet (240 mg total) by mouth 3 (three) times daily with meals.   HYDROcodone-acetaminophen 5-325 MG tablet Commonly known as:  NORCO/VICODIN Take 1 tablet by mouth every 6 (six) hours as needed for severe pain. Prescription needed for SNF placement NCCSR reviewed   loperamide 2 MG tablet Commonly known as:  IMODIUM A-D Take 4 mg by mouth See admin instructions. Take 2 tablets ( 4 mg) by mouth after 1st loose stool as needed for diarrhea   metoprolol tartrate 25 MG  tablet Commonly known as:  LOPRESSOR Take 25 mg by mouth See admin instructions. Take 25 mg by mouth two times a day on Sun/Tues/Thurs/Sat and 25 mg mg at bedtime on Mon/Wed/Fri   midodrine 10 MG tablet Commonly known as:  PROAMATINE Take one tablet (10 mg) by mouth on Monday, Wednesday, Friday 30 minutes prior to dialysis.   multivitamin with minerals Tabs tablet Take 1 tablet by mouth daily.   pantoprazole 40 MG tablet Commonly known as:  PROTONIX Take 1 tablet (40 mg total) by mouth daily.   RENAL VITAMIN PO Take 1 tablet by mouth at bedtime.   warfarin 2.5 MG tablet Commonly known as:  COUMADIN Take 2.5 mg by mouth daily.       No orders of the defined types were placed in this encounter.    There is no immunization history on file for this patient.  Social History   Tobacco Use   Smoking status: Former Smoker    Types: Cigarettes     Last attempt to quit: 06/30/2004    Years since quitting: 14.9   Smokeless tobacco: Never Used  Substance Use Topics   Alcohol use: No    Family history is   Family History  Problem Relation Age of Onset   Alzheimer's disease Mother    Diabetes Father        Amputation:  bilateral legs   Cancer Daughter        breast cancer   Diabetes Son    Heart disease Son        before age 4   Hypertension Son    Anesthesia problems Neg Hx       Review of Systems  DATA OBTAINED: from patient, nurse GENERAL:  no fevers, fatigue, appetite changes SKIN: No itching, or rash EYES: No eye pain, redness, discharge EARS: No earache, tinnitus, change in hearing NOSE: No congestion, drainage or bleeding  MOUTH/THROAT: No mouth or tooth pain, No sore throat RESPIRATORY: No cough, wheezing, SOB CARDIAC: No chest pain, palpitations, lower extremity edema  GI: No abdominal pain, No N/V/D or constipation, No heartburn or reflux  GU: No dysuria, frequency or urgency, or incontinence  MUSCULOSKELETAL: No unrelieved bone/joint pain NEUROLOGIC: No headache, dizziness or focal weakness PSYCHIATRIC: No c/o anxiety or sadness   Vitals:   06/01/19 0812  BP: 123/73  Pulse: 76  Resp: 18  Temp: (!) 97 F (36.1 C)    SpO2 Readings from Last 1 Encounters:  05/31/19 96%   There is no height or weight on file to calculate BMI.     Physical Exam  GENERAL APPEARANCE: Alert, conversant,  No acute distress.  SKIN: No diaphoresis rash HEAD: Normocephalic, atraumatic  EYES: Conjunctiva/lids clear. Pupils round, reactive. EOMs intact.  EARS: External exam WNL, canals clear. Hearing grossly normal.  NOSE: No deformity or discharge.  MOUTH/THROAT: Lips w/o lesions  RESPIRATORY: Breathing is even, unlabored. Lung sounds are clear   CARDIOVASCULAR: Heart RRR no murmurs, rubs or gallops. No peripheral edema.   GASTROINTESTINAL: Abdomen is soft, non-tender, not distended w/ normal bowel  sounds. GENITOURINARY: Bladder non tender, not distended  MUSCULOSKELETAL: Bilateral AKA NEUROLOGIC:  Cranial nerves 2-12 grossly intact. 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 ([place]) 03/24/2019   Anemia due to end stage renal disease ([place]) 03/24/2019   Protein-calorie malnutrition, severe ([place]) 03/24/2019   Hyperlipidemia associated with type 2 diabetes mellitus ([place]) 02/22/2019   Amputation stump infection ([place]) 02/15/2019   Conjunctivitis of left eye 01/04/2019   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[place]) 12/30/2018   Open wound of left foot    Anticoagulation management encounter    OSA (obstructive sleep apnea)    HTN (hypertension)    ESRD on hemodialysis (HCC)    BPH (benign prostatic hyperplasia)    Hypotension 12/07/2018   GERD (gastroesophageal reflux disease) 12/07/2018   Pressure injury of skin 12/03/2018   Symptomatic anemia 12/02/2018   Acute on chronic blood loss anemia 12/02/2018   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[place]) 12/02/2018   Lower GI bleeding 12/02/2018   Heme positive stool    Acute on chronic diastolic (congestive) heart failure ([place]) 11/19/2018   Acute CVA (cerebrovascular accident) ([place]) 11/19/2018   Hypoglycemia 11/19/2018   Peripheral vascular disease due to secondary diabetes ([place]) 11/19/2018   Dry gangrene ([place]) 11/19/2018   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   DNR (do not resuscitate) discussion    Goals of care, counseling/discussion    Palliative care by specialist    Acute hypoxemic respiratory  failure ([place]) 11/06/2018   Volume overload 11/06/2018   Multifocal pneumonia 36/64/4034   Acute metabolic encephalopathy 74/25/9563   Physical deconditioning 11/06/2018   Acute respiratory failure with hypoxia and hypercapnia ([place]) 11/04/2018   CAP (community acquired pneumonia) 11/04/2018   Pleural effusion, right 11/04/2018   Increased ammonia level 11/04/2018   Abnormal CT of the abdomen 11/04/2018   Atrial fibrillation with RVR ([place]) 11/04/2018   Sepsis ([place]) 10/21/2018   Acute encephalopathy 10/21/2018   Elevated alkaline phosphatase level 10/21/2018   Chronic anemia 10/21/2018   Thrombocytopenia ([place]) 10/21/2018   Pulmonary HTN ([place]) 03/04/2017   Aortic valve stenosis 03/04/2017   Hypertension, accelerated with heart disease, without CHF 01/12/2017   Dyspnea on exertion 01/12/2017   ESRD (end stage renal disease) ([place]) 06/07/2012   Hyperlipidemia 04/24/2011   HYPERTENSION, BENIGN 10/24/2009   Type 2 diabetes mellitus with hypertension and end stage renal disease on dialysis ([place]) 10/23/2009   OBSTRUCTIVE SLEEP APNEA 10/23/2009   PAF (paroxysmal atrial fibrillation) ([place]) 10/23/2009   CHF (congestive heart failure) ([place]) 10/23/2009      Labs reviewed: Basic Metabolic Panel:    Component Value Date/Time   NA 136 05/31/2019 1319   NA 141 02/27/2019   K 3.6 05/31/2019 1319   CL 98 05/31/2019 1319   CO2 24 05/31/2019 1319   GLUCOSE 140 (H) 05/31/2019 1319   BUN 44 (H) 05/31/2019 1319   BUN 64 (A) 02/27/2019   CREATININE 4.29 (H) 05/31/2019 1319   CALCIUM 9.1 05/31/2019 1319   CALCIUM 8.3 (L) 10/25/2008 1450   PROT 6.5 05/28/2019 0253   ALBUMIN 2.3 (L) 05/31/2019 1319   AST 31 05/28/2019 0253   ALT 42 05/28/2019 0253   ALKPHOS 261 (H) 05/28/2019 0253   BILITOT 0.6 05/28/2019 0253   GFRNONAA 12 (L) 05/31/2019 1319   GFRAA 14 (L) 05/31/2019 1319    Recent Labs    02/20/19 1300  05/28/19 0253 05/29/19 0541 05/29/19 2316  05/30/19 0727 05/31/19 1319  NA 137   < > 137  --   --  137 136  K 3.0*   < > 3.7  --   --  5.1 3.6  CL 101   < > 102  --   --  101 98  CO2 23   < > 21*  --   --  17* 24  GLUCOSE 101*   < > 151*  --   --  99 140*  BUN 27*   < > 80*  --   --  158* 44*  CREATININE 3.41*   < > 6.12*  --   --  8.32* 4.29*  CALCIUM 7.7*   < > 9.3  --   --  10.0 9.1  MG  --    < > 2.0 2.1 2.4  --   --   PHOS 2.4*  --   --   --   --  2.6 3.0   < > = values in this interval not displayed.   Liver Function Tests: Recent Labs    05/26/19 0214 05/27/19 0348 05/28/19 0253 05/30/19 0727 05/31/19 1319  AST 49* 55* 31  --   --   ALT 44 55* 42  --   --   ALKPHOS 235* 272* 261*  --   --   BILITOT 0.7 0.8 0.6  --   --   PROT 6.8 7.0 6.5  --   --   ALBUMIN 2.4* 2.4* 2.2* 2.3* 2.3*   Recent Labs    10/21/18 0520 10/24/18 0614  LIPASE 35 32   Recent Labs    10/26/18 0538 11/06/18 1850 11/09/18 0230  AMMONIA 42* 40* 42*   CBC: Recent Labs    12/24/18 0227  02/15/19 1242  05/25/19 1421  05/29/19 2316 05/30/19 0727 05/31/19 1319  WBC 15.6*   < > 14.7*   < > 17.5*   < > 15.8* 14.6* 10.3  NEUTROABS 13.0*  --  11.8*  --  14.2*  --   --   --   --   HGB 10.5*   < > 10.1*   < > 10.7*   < > 6.8* 7.4* 7.6*  HCT 33.5*   < > 35.1*   < > 35.1*   < > 21.8* 23.1* 24.7*  MCV 99.4   < > 90.5   < > 89.1   < > 86.2 85.9 88.5  PLT 204   < > 227   < > 251   < > 206 192 220   < > = values in this interval not displayed.   Lipid Recent Labs    11/11/18 1517  CHOL 113  HDL 39*  LDLCALC 62  TRIG 60    Cardiac Enzymes: Recent Labs    11/06/18 1855  TROPONINI <0.03   BNP: Recent Labs    11/06/18 1905  BNP 1,611.6*   No results found for: [facility] Lab Results  Component Value Date   HGBA1C 4.9 02/16/2019   Lab Results  Component Value Date   TSH 1.839 05/25/2019   Lab Results  Component Value Date   ELTRVUYE33 435 11/11/2018   Lab Results  Component Value Date   FOLATE  10/17/2008     >20.0 (NOTE)  Reference Ranges        Deficient:       0.4 - 3.3 ng/mL        Indeterminate:   3.4 - 5.4 ng/mL        Normal:              > 5.4  ng/mL   Lab Results  Component Value Date   IRON 48 06/05/2011   TIBC 194 (L) 06/05/2011   FERRITIN 872 (H) 06/05/2011    Imaging and Procedures obtained prior to SNF admission: Dg Chest Port 1 View  Result Date: 05/25/2019 CLINICAL DATA:  Sepsis. EXAM: PORTABLE CHEST 1 VIEW COMPARISON:  Single-view of the chest 02/15/2019 and 10/22/2018. PA and lateral chest 12/27/2018. FINDINGS: Lung volumes are low with some crowding of the bronchovascular structures. Mild vascular congestion is seen. Heart size is upper normal. Aortic atherosclerosis noted. No pneumothorax or pleural effusion. IMPRESSION: Mild pulmonary vascular congestion.  No focal airspace disease. Atherosclerosis. Electronically Signed   By: Inge Rise M.D.   On: 05/25/2019 15:51   Dg Femur Portable Min 2 Views Right  Result Date: 05/25/2019 CLINICAL DATA:  78 year old male with a history of potential osteomyelitis EXAM: RIGHT FEMUR PORTABLE 2 VIEW COMPARISON:  02/15/2019 FINDINGS: Surgical changes of right above knee amputation. There are no erosive changes at the osteotomy site. There has been development of a small bone spur on the medial aspect of the distal cortex, in the region of the previous MRI findings. Triangular shaped focus of heterotopic bone within the soft tissues on the oblique image. No subcutaneous gas or focal soft tissue swelling identified. Vascular calcifications. IMPRESSION: No erosive changes at the right femur osteotomy site that would suggest acute osteomyelitis. Focal bone spur and heterotopic ossification within the soft tissues, potentially postoperative changes versus chronic reactive changes. Electronically Signed   By: Corrie Mckusick D.O.   On: 05/25/2019 15:54     Not all labs, radiology exams or other studies done during hospitalization come through on my  EPIC note; however they are reviewed by me.    Assessment and Plan  Sepsis/possible infection of right AKA stump- was started on broad-spectrum antibiotics which were narrowed down to Ancef per ID.  Plans to treat for 9 more days on dialysis days to be completed on 6/11; x-ray of the femur demonstrate no erosive changes suggestive of osteomyelitis; final culture data no growth so far SNF- admitted for Ancef IV for 9 more days through 6-11, for OT/PT and residential care  Hypertension SNF- controlled; on 25 mg by mouth twice daily on non-dialysis day and 25 mg at bedtime on dialysis days; patient is treated with Middaugh drain 10 mg on the morning of dialysis prior to dialysis  Hyperlipidemia SNF-not stated as uncontrolled; continue Lipitor 20 mg daily  GERD SNF not stated as uncontrolled; continue Protonix 40 mg daily  Paroxysmal atrial fibrillation  SNF-continue metoprolol 25 mg twice daily on nondialysis days and nightly on dialysis days which are Monday Wednesday and Friday and warfarin 2.5 mg daily as anticoagulation   Time spent greater than 35 minutes;> 50% of time with patient was spent reviewing records, labs, tests and studies, counseling and developing plan of care  Hennie Duos, MD

## 2019-06-01 NOTE — Progress Notes (Deleted)
: Provider:  Noah [name]. Alexander MD. Location:  [facility] Room Number: 660/Y Place of Service:  SNF ([phone])  PCP: Edward Duos, MD Patient Care Team: Edward Duos, MD as PCP - General (Internal Medicine) Edward Contras, MD as Consulting Physician (Nephrology) Rehab, [facility], [facility]  Extended Emergency Contact Information Primary Emergency Contact: [name],[name] Address: [place], [place] [address] [name] of [place] Phone: [phone] Work Phone: [phone] Mobile Phone: [phone] Relation: Daughter Secondary Emergency Contact: [name] Mobile Phone: [phone] Relation: Other     Allergies: Occlusive silicone sheets [silicone]; Other; and Tape  Chief Complaint  Patient presents with  . Readmit To SNF    Readmission Visit    HPI: Patient is 78 y.o. male who  Past Medical History:  Diagnosis Date  . A-fib ([place])   . Anemia   . Blood transfusion   . BPH (benign prostatic hyperplasia)   . CHF (congestive heart failure) ([place])   . Diarrhea   . DM (diabetes mellitus) ([place])   . ESRD on hemodialysis ([place])    Started dialysis in 2009  . History of GI bleed    secondary to coumadin  . HTN (hypertension)   . Hyperlipidemia   . OSA (obstructive sleep apnea)    uses CPAP  . Secondary hyperparathyroidism of renal origin [facility])     Past Surgical History:  Procedure Laterality Date  . ABDOMINAL AORTOGRAM N/A 11/15/2018   Procedure: ABDOMINAL AORTOGRAM;  Surgeon: [name], MD;  Location: [facility];  Service: Cardiovascular;  Laterality: N/A;  . AMPUTATION Left 12/06/2018   Procedure: AMPUTATION DIGIT LEFT FIFTH TOE;  Surgeon: Angelia Mould, MD;  Location: [place] [facility];  Service: Vascular;  Laterality: Left;  . AMPUTATION Bilateral 12/29/2018   Procedure: AMPUTATION ABOVE KNEE;  Surgeon: Waynetta Sandy, MD;   Location: [facility];  Service: Vascular;  Laterality: Bilateral;  . APPLICATION OF WOUND VAC Right 02/17/2019   Procedure: Application Of Wound Vac;  Surgeon: Marty Heck, MD;  Location: [facility];  Service: Vascular;  Laterality: Right;  . BVT  6/94/85   Left  Basilic Vein Transposition  . CHOLECYSTECTOMY    . EYE SURGERY     Catarct bil  . I&D EXTREMITY Right 02/17/2019   Procedure: Right above the kneee debridement;  Surgeon: Marty Heck, MD;  Location: [facility];  Service: Vascular;  Laterality: Right;  . INSERTION OF DIALYSIS CATHETER  05/28/2012   Procedure: INSERTION OF DIALYSIS CATHETER;  Surgeon: Mal Misty, MD;  Location: [facility];  Service: Vascular;  Laterality: Right;  . Left arm shuntogram.    . Left forearm loop graft with 6 mm Gore-Tex graft.    . LOWER EXTREMITY ANGIOGRAPHY Bilateral 11/15/2018   Procedure: LOWER EXTREMITY ANGIOGRAPHY;  Surgeon: [name], MD;  Location: [facility];  Service: Cardiovascular;  Laterality: Bilateral;  . Pars plana vitrectomy with 25-gauge system    . PERIPHERAL VASCULAR ATHERECTOMY Left 11/15/2018   Procedure: PERIPHERAL VASCULAR ATHERECTOMY;  Surgeon: [name], MD;  Location: [place] [facility];  Service: Cardiovascular;  Laterality: Left;  SFA with STENT  . PERIPHERAL VASCULAR BALLOON ANGIOPLASTY Left 11/15/2018   Procedure: PERIPHERAL VASCULAR BALLOON ANGIOPLASTY;  Surgeon: [name], MD;  Location: [facility];  Service: Cardiovascular;  Laterality: Left;  PT  Allergies as of 06/01/2019      Reactions   Occlusive Silicone Sheets [silicone] Other (See Comments)   "Allergic," per [facility]   Other Other (See Comments)   Unknown reaction to Occlusive adhesive- "Allergic," per [facility]   Tape Itching, Other (See Comments)   Use Cloth tape only, please      Medication List       Accurate as of June 01, 2019  8:33 AM. If you have any questions, ask your nurse or doctor.        acetaminophen 500 MG  tablet Commonly known as:  TYLENOL Take 1,000 mg by mouth 3 (three) times daily.   ADULT NUTRITIONAL SUPPLEMENT PO Take by mouth. Magic cup with dinner   atorvastatin 20 MG tablet Commonly known as:  LIPITOR Take 20 mg by mouth daily.   calcium acetate 667 MG capsule Commonly known as:  PHOSLO Take 1,334 mg by mouth 3 (three) times daily with meals.   ceFAZolin 2-4 GM/100ML-% IVPB Commonly known as:  ANCEF 2g given on HD M and W and 3g given on Friday   dicyclomine 20 MG tablet Commonly known as:  BENTYL Take 20 mg by mouth 3 (three) times daily before meals.   doxercalciferol 4 MCG/2ML injection Commonly known as:  HECTOROL Inject 1 mL (2 mcg total) into the vein every Monday, Wednesday, and Friday with hemodialysis.   feeding supplement (PRO-STAT SUGAR FREE 64) Liqd Take 30 mLs by mouth 2 (two) times daily. 9a and 5p   ferrous gluconate 240 (27 FE) MG tablet Commonly known as:  FERGON Take 1 tablet (240 mg total) by mouth 3 (three) times daily with meals.   HYDROcodone-acetaminophen 5-325 MG tablet Commonly known as:  NORCO/VICODIN Take 1 tablet by mouth every 6 (six) hours as needed for severe pain. Prescription needed for SNF placement NCCSR reviewed   loperamide 2 MG tablet Commonly known as:  IMODIUM A-D Take 4 mg by mouth See admin instructions. Take 2 tablets ( 4 mg) by mouth after 1st loose stool as needed for diarrhea   metoprolol tartrate 25 MG tablet Commonly known as:  LOPRESSOR Take 25 mg by mouth See admin instructions. Take 25 mg by mouth two times a day on Sun/Tues/Thurs/Sat and 25 mg mg at bedtime on Mon/Wed/Fri   midodrine 10 MG tablet Commonly known as:  PROAMATINE Take one tablet (10 mg) by mouth on Monday, Wednesday, Friday 30 minutes prior to dialysis.   multivitamin with minerals Tabs tablet Take 1 tablet by mouth daily.   pantoprazole 40 MG tablet Commonly known as:  PROTONIX Take 1 tablet (40 mg total) by mouth daily.   RENAL VITAMIN  PO Take 1 tablet by mouth at bedtime.   warfarin 2.5 MG tablet Commonly known as:  COUMADIN Take 2.5 mg by mouth daily.       No orders of the defined types were placed in this encounter.    There is no immunization history on file for this patient.  Social History   Tobacco Use  . Smoking status: Former Smoker    Types: Cigarettes    Last attempt to quit: 06/30/2004    Years since quitting: 14.9  . Smokeless tobacco: Never Used  Substance Use Topics  . Alcohol use: No    Family history is   Family History  Problem Relation Age of Onset  . Alzheimer's disease Mother   . Diabetes Father        Amputation:  bilateral legs  .  Cancer Daughter        breast cancer  . Diabetes Son   . Heart disease Son        before age 13  . Hypertension Son   . Anesthesia problems Neg Hx       Review of Systems  DATA OBTAINED: from patient, nurse, medical record, family member GENERAL:  no fevers, fatigue, appetite changes SKIN: No itching, or rash EYES: No eye pain, redness, discharge EARS: No earache, tinnitus, change in hearing NOSE: No congestion, drainage or bleeding  MOUTH/THROAT: No mouth or tooth pain, No sore throat RESPIRATORY: No cough, wheezing, SOB CARDIAC: No chest pain, palpitations, lower extremity edema  GI: No abdominal pain, No N/V/D or constipation, No heartburn or reflux  GU: No dysuria, frequency or urgency, or incontinence  MUSCULOSKELETAL: No unrelieved bone/joint pain NEUROLOGIC: No headache, dizziness or focal weakness PSYCHIATRIC: No c/o anxiety or sadness   Vitals:   06/01/19 0812  BP: 123/73  Pulse: 76  Resp: 18  Temp: (!) 97 F (36.1 C)    SpO2 Readings from Last 1 Encounters:  05/31/19 96%   There is no height or weight on file to calculate BMI.     Physical Exam  GENERAL APPEARANCE: Alert, conversant,  No acute distress.  SKIN: No diaphoresis rash HEAD: Normocephalic, atraumatic  EYES: Conjunctiva/lids clear. Pupils round,  reactive. EOMs intact.  EARS: External exam WNL, canals clear. Hearing grossly normal.  NOSE: No deformity or discharge.  MOUTH/THROAT: Lips w/o lesions  RESPIRATORY: Breathing is even, unlabored. Lung sounds are clear   CARDIOVASCULAR: Heart RRR no murmurs, rubs or gallops. No peripheral edema.   GASTROINTESTINAL: Abdomen is soft, non-tender, not distended w/ normal bowel sounds. GENITOURINARY: Bladder non tender, not distended  MUSCULOSKELETAL: No abnormal joints or musculature NEUROLOGIC:  Cranial nerves 2-12 grossly intact. Moves all extremities  PSYCHIATRIC: Mood and affect appropriate to situation, no behavioral issues  Patient Active Problem List   Diagnosis Date Noted  . 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 Anemia due to end stage renal disease ([place]) 03/24/2019  . Protein-calorie malnutrition, severe ([place]) 03/24/2019  . Hyperlipidemia associated with type 2 diabetes mellitus ([place]) 02/22/2019  . Amputation stump infection ([place]) 02/15/2019  . Conjunctivitis of left eye 01/04/2019  . 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[place]) 12/30/2018  . Open wound of left foot   . Anticoagulation management encounter   . OSA (obstructive sleep apnea)   . HTN (hypertension)   . ESRD on hemodialysis ([place])   . BPH (benign prostatic hyperplasia)   . Hypotension 12/07/2018  . GERD (gastroesophageal reflux disease) 12/07/2018  . Pressure injury of skin 12/03/2018  . Symptomatic anemia 12/02/2018  . Acute on chronic blood loss anemia 12/02/2018  . 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[place]) 12/02/2018  . Lower GI bleeding 12/02/2018  . Heme positive stool   . Acute on chronic diastolic (congestive) heart failure ([place])  11/19/2018  . Acute CVA (cerebrovascular accident) ([place]) 11/19/2018  . Hypoglycemia 11/19/2018  . Peripheral vascular disease due to secondary diabetes ([place]) 11/19/2018  . Dry gangrene ([place]) 11/19/2018  . 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 DNR (do not resuscitate) discussion   . Goals of care, counseling/discussion   .  Palliative care by specialist   . Acute hypoxemic respiratory failure ([place]) 11/06/2018  . Volume overload 11/06/2018  . Multifocal pneumonia 11/06/2018  . Acute metabolic encephalopathy 85/88/5027  . Physical deconditioning 11/06/2018  . Acute respiratory failure with hypoxia and hypercapnia ([place]) 11/04/2018  . CAP (community acquired pneumonia) 11/04/2018  . Pleural effusion, right 11/04/2018  . Increased ammonia level 11/04/2018  . Abnormal CT of the abdomen 11/04/2018  . Atrial fibrillation with RVR ([place]) 11/04/2018  . Sepsis ([place]) 10/21/2018  . Acute encephalopathy 10/21/2018  . Elevated alkaline phosphatase level 10/21/2018  . Chronic anemia 10/21/2018  . Thrombocytopenia ([place]) 10/21/2018  . Pulmonary HTN ([place]) 03/04/2017  . Aortic valve stenosis 03/04/2017  . Hypertension, accelerated with heart disease, without CHF 01/12/2017  . Dyspnea on exertion 01/12/2017  . ESRD (end stage renal disease) ([place]) 06/07/2012  . Hyperlipidemia 04/24/2011  . HYPERTENSION, BENIGN 10/24/2009  . Type 2 diabetes mellitus with hypertension and end stage renal disease on dialysis ([place]) 10/23/2009  . OBSTRUCTIVE SLEEP APNEA 10/23/2009  . PAF (paroxysmal atrial fibrillation) ([place]) 10/23/2009  . CHF (congestive heart failure) ([place]) 10/23/2009      Labs reviewed: Basic Metabolic Panel:    Component Value Date/Time   NA 136 05/31/2019 1319   NA 141 02/27/2019   K 3.6 05/31/2019 1319   CL 98 05/31/2019 1319   CO2 24 05/31/2019 1319   GLUCOSE 140 (H) 05/31/2019 1319   BUN 44 (H) 05/31/2019 1319   BUN 64 (A) 02/27/2019   CREATININE 4.29 (H) 05/31/2019  1319   CALCIUM 9.1 05/31/2019 1319   CALCIUM 8.3 (L) 10/25/2008 1450   PROT 6.5 05/28/2019 0253   ALBUMIN 2.3 (L) 05/31/2019 1319   AST 31 05/28/2019 0253   ALT 42 05/28/2019 0253   ALKPHOS 261 (H) 05/28/2019 0253   BILITOT 0.6 05/28/2019 0253   GFRNONAA 12 (L) 05/31/2019 1319   GFRAA 14 (L) 05/31/2019 1319    Recent Labs    02/20/19 1300  05/28/19 0253 05/29/19 0541 05/29/19 2316 05/30/19 0727 05/31/19 1319  NA 137   < > 137  --   --  137 136  K 3.0*   < > 3.7  --   --  5.1 3.6  CL 101   < > 102  --   --  101 98  CO2 23   < > 21*  --   --  17* 24  GLUCOSE 101*   < > 151*  --   --  99 140*  BUN 27*   < > 80*  --   --  158* 44*  CREATININE 3.41*   < > 6.12*  --   --  8.32* 4.29*  CALCIUM 7.7*   < > 9.3  --   --  10.0 9.1  MG  --    < > 2.0 2.1 2.4  --   --   PHOS 2.4*  --   --   --   --  2.6 3.0   < > = values in this interval not displayed.   Liver Function Tests: Recent Labs    05/26/19 0214 05/27/19 0348 05/28/19 0253 05/30/19 0727 05/31/19 1319  AST 49* 55* 31  --   --   ALT 44 55* 42  --   --   ALKPHOS 235* 272* 261*  --   --   BILITOT 0.7 0.8 0.6  --   --   PROT 6.8 7.0 6.5  --   --  ALBUMIN 2.4* 2.4* 2.2* 2.3* 2.3*   Recent Labs    10/21/18 0520 10/24/18 0614  LIPASE 35 32   Recent Labs    10/26/18 0538 11/06/18 1850 11/09/18 0230  AMMONIA 42* 40* 42*   CBC: Recent Labs    12/24/18 0227  02/15/19 1242  05/25/19 1421  05/29/19 2316 05/30/19 0727 05/31/19 1319  WBC 15.6*   < > 14.7*   < > 17.5*   < > 15.8* 14.6* 10.3  NEUTROABS 13.0*  --  11.8*  --  14.2*  --   --   --   --   HGB 10.5*   < > 10.1*   < > 10.7*   < > 6.8* 7.4* 7.6*  HCT 33.5*   < > 35.1*   < > 35.1*   < > 21.8* 23.1* 24.7*  MCV 99.4   < > 90.5   < > 89.1   < > 86.2 85.9 88.5  PLT 204   < > 227   < > 251   < > 206 192 220   < > = values in this interval not displayed.   Lipid Recent Labs    11/11/18 1517  CHOL 113  HDL 39*  LDLCALC 62  TRIG 60    Cardiac  Enzymes: Recent Labs    11/06/18 1855  TROPONINI <0.03   BNP: Recent Labs    11/06/18 1905  BNP 1,611.6*   No results found for: [facility] Lab Results  Component Value Date   HGBA1C 4.9 02/16/2019   Lab Results  Component Value Date   TSH 1.839 05/25/2019   Lab Results  Component Value Date   JOINOMVE72 094 11/11/2018   Lab Results  Component Value Date   FOLATE  10/17/2008    >20.0 (NOTE)  Reference Ranges        Deficient:       0.4 - 3.3 ng/mL        Indeterminate:   3.4 - 5.4 ng/mL        Normal:              > 5.4 ng/mL   Lab Results  Component Value Date   IRON 48 06/05/2011   TIBC 194 (L) 06/05/2011   FERRITIN 872 (H) 06/05/2011    Imaging and Procedures obtained prior to SNF admission: Dg Chest Port 1 View  Result Date: 05/25/2019 CLINICAL DATA:  Sepsis. EXAM: PORTABLE CHEST 1 VIEW COMPARISON:  Single-view of the chest 02/15/2019 and 10/22/2018. PA and lateral chest 12/27/2018. FINDINGS: Lung volumes are low with some crowding of the bronchovascular structures. Mild vascular congestion is seen. Heart size is upper normal. Aortic atherosclerosis noted. No pneumothorax or pleural effusion. IMPRESSION: Mild pulmonary vascular congestion.  No focal airspace disease. Atherosclerosis. Electronically Signed   By: Inge Rise M.D.   On: 05/25/2019 15:51   Dg Femur Portable Min 2 Views Right  Result Date: 05/25/2019 CLINICAL DATA:  78 year old male with a history of potential osteomyelitis EXAM: RIGHT FEMUR PORTABLE 2 VIEW COMPARISON:  02/15/2019 FINDINGS: Surgical changes of right above knee amputation. There are no erosive changes at the osteotomy site. There has been development of a small bone spur on the medial aspect of the distal cortex, in the region of the previous MRI findings. Triangular shaped focus of heterotopic bone within the soft tissues on the oblique image. No subcutaneous gas or focal soft tissue swelling identified. Vascular calcifications.  IMPRESSION: No erosive changes at the right femur  osteotomy site that would suggest acute osteomyelitis. Focal bone spur and heterotopic ossification within the soft tissues, potentially postoperative changes versus chronic reactive changes. Electronically Signed   By: Corrie Mckusick D.O.   On: 05/25/2019 15:54     Not all labs, radiology exams or other studies done during hospitalization come through on my EPIC note; however they are reviewed by me.    Assessment and Plan  No problem-specific Assessment & Plan notes found for this encounter.   Edward Duos, MD

## 2019-06-06 LAB — CBC AND DIFFERENTIAL
HCT: 29 — AB (ref 41–53)
Hemoglobin: 9.3 — AB (ref 13.5–17.5)
Neutrophils Absolute: 8
Platelets: 251 (ref 150–399)
WBC: 11.5

## 2019-06-06 LAB — BASIC METABOLIC PANEL
BUN: 44 — AB (ref 4–21)
Creatinine: 3.5 — AB (ref 0.6–1.3)
Glucose: 86
Potassium: 3.7 (ref 3.4–5.3)
Sodium: 143 (ref 137–147)

## 2019-06-07 ENCOUNTER — Encounter: Payer: Self-pay | Admitting: Internal Medicine

## 2019-06-09 ENCOUNTER — Non-Acute Institutional Stay (SKILLED_NURSING_FACILITY): Payer: Medicare Other | Admitting: Internal Medicine

## 2019-06-09 ENCOUNTER — Encounter: Payer: Self-pay | Admitting: Internal Medicine

## 2019-06-09 DIAGNOSIS — I480 Paroxysmal atrial fibrillation: Secondary | ICD-10-CM | POA: Diagnosis not present

## 2019-06-09 NOTE — Progress Notes (Signed)
: Provider:  Noah Delaine. Danyell Shader MD. Location:  [place] Room Number: 509-P Place of Service:  SNF ([id number])  PCP: Hennie Duos, MD Patient Care Team: Hennie Duos, MD as PCP - General (Internal Medicine) [name], MD as Consulting Physician (Nephrology) Rehab, [facility], [facility]  Extended Emergency Contact Information Primary Emergency Contact: [name],[name] Address: [place], [place] [address] [name] of [place] Phone: [phone] Work Phone: [phone] Mobile Phone: [phone] Relation: Daughter Secondary Emergency Contact: [name] Mobile Phone: [phone] Relation: Other     Allergies: Occlusive silicone sheets [silicone], Other, and Tape  Chief Complaint  Patient presents with  . Acute Visit    Acute Visit    HPI: Patient is 78 y.o. male who is being seen for routine INR check.  Patient's INR today is 2.1 and patient is on 3 mg daily.  Patient has no complaints  Past Medical History:  Diagnosis Date  . A-fib ([place])   . Anemia   . Blood transfusion   . BPH (benign prostatic hyperplasia)   . CHF (congestive heart failure) ([place])   . Diarrhea   . DM (diabetes mellitus) ([place])   . ESRD on hemodialysis ([place])    Started dialysis in 2009  . History of GI bleed    secondary to coumadin  . HTN (hypertension)   . Hyperlipidemia   . OSA (obstructive sleep apnea)    uses CPAP  . Secondary hyperparathyroidism of renal origin [facility])     Past Surgical History:  Procedure Laterality Date  . ABDOMINAL AORTOGRAM N/A 11/15/2018   Procedure: ABDOMINAL AORTOGRAM;  Surgeon: [name], MD;  Location: [place] [facility];  Service: Cardiovascular;  Laterality: N/A;  . AMPUTATION Left 12/06/2018   Procedure: AMPUTATION DIGIT LEFT FIFTH TOE;  Surgeon: Angelia Mould, MD;  Location: [place];  Service: Vascular;  Laterality: Left;   . AMPUTATION Bilateral 12/29/2018   Procedure: AMPUTATION ABOVE KNEE;  Surgeon: Waynetta Sandy, MD;  Location: [place];  Service: Vascular;  Laterality: Bilateral;  . APPLICATION OF WOUND VAC Right 02/17/2019   Procedure: Application Of Wound Vac;  Surgeon: Marty Heck, MD;  Location: [place];  Service: Vascular;  Laterality: Right;  . BVT  9/83/38   Left  Basilic Vein Transposition  . CHOLECYSTECTOMY    . EYE SURGERY     Catarct bil  . I&D EXTREMITY Right 02/17/2019   Procedure: Right above the kneee debridement;  Surgeon: Marty Heck, MD;  Location: [place];  Service: Vascular;  Laterality: Right;  . INSERTION OF DIALYSIS CATHETER  05/28/2012   Procedure: INSERTION OF DIALYSIS CATHETER;  Surgeon: Mal Misty, MD;  Location: [place];  Service: Vascular;  Laterality: Right;  . Left arm shuntogram.    . Left forearm loop graft with 6 mm Gore-Tex graft.    . LOWER EXTREMITY ANGIOGRAPHY Bilateral 11/15/2018   Procedure: LOWER EXTREMITY ANGIOGRAPHY;  Surgeon: [name], MD;  Location: [place] [facility];  Service: Cardiovascular;  Laterality: Bilateral;  . Pars plana vitrectomy with 25-gauge system    . PERIPHERAL VASCULAR ATHERECTOMY Left 11/15/2018   Procedure: PERIPHERAL VASCULAR ATHERECTOMY;  Surgeon: [name], MD;  Location: [place] [facility];  Service: Cardiovascular;  Laterality: Left;  SFA with STENT  . PERIPHERAL VASCULAR BALLOON ANGIOPLASTY Left 11/15/2018   Procedure: PERIPHERAL VASCULAR  BALLOON ANGIOPLASTY;  Surgeon: [name], MD;  Location: [place] [facility];  Service: Cardiovascular;  Laterality: Left;  PT    Allergies as of 06/09/2019      Reactions   Occlusive Silicone Sheets [silicone] Other (See Comments)   "Allergic," per [facility]   Other Other (See Comments)   Unknown reaction to Occlusive adhesive- "Allergic," per [facility]   Tape Itching, Other (See Comments)   Use Cloth tape only, please      Medication List       Accurate as of  June 09, 2019  4:45 PM. If you have any questions, ask your nurse or doctor.        STOP taking these medications   ceFAZolin 2-4 GM/100ML-% IVPB Commonly known as: ANCEF Stopped by: Inocencio Homes, MD   ferrous gluconate 240 (27 FE) MG tablet Commonly known as: FERGON Stopped by: Inocencio Homes, MD     TAKE these medications   acetaminophen 500 MG tablet Commonly known as: TYLENOL Take 1,000 mg by mouth 3 (three) times daily.   ADULT NUTRITIONAL SUPPLEMENT PO Take by mouth. Magic cup with dinner   atorvastatin 20 MG tablet Commonly known as: LIPITOR Take 20 mg by mouth daily.   calcium acetate 667 MG capsule Commonly known as: PHOSLO Take 1,334 mg by mouth 3 (three) times daily with meals.   dicyclomine 20 MG tablet Commonly known as: BENTYL Take 20 mg by mouth 3 (three) times daily before meals.   doxercalciferol 4 MCG/2ML injection Commonly known as: HECTOROL Inject 1 mL (2 mcg total) into the vein every Monday, Wednesday, and Friday with hemodialysis.   feeding supplement (PRO-STAT SUGAR FREE 64) Liqd Take 30 mLs by mouth 2 (two) times daily. 9a and 5p   HYDROcodone-acetaminophen 5-325 MG tablet Commonly known as: NORCO/VICODIN Take 1 tablet by mouth every 6 (six) hours as needed for severe pain. For no more than 14 days   loperamide 2 MG tablet Commonly known as: IMODIUM A-D Take 4 mg by mouth See admin instructions. Take 2 tablets ( 4 mg) by mouth after 1st loose stool as needed for diarrhea   metoprolol tartrate 25 MG tablet Commonly known as: LOPRESSOR Take 25 mg by mouth See admin instructions. Take 25 mg by mouth two times a day on Sun/Tues/Thurs/Sat and 25 mg mg at bedtime on Mon/Wed/Fri   midodrine 10 MG tablet Commonly known as: PROAMATINE Take one tablet (10 mg) by mouth on Monday, Wednesday, Friday 30 minutes prior to dialysis.   multivitamin with minerals Tabs tablet Take 1 tablet by mouth daily.   pantoprazole 40 MG tablet Commonly known  as: PROTONIX Take 1 tablet (40 mg total) by mouth daily.   RENAL VITAMIN PO Take 1 tablet by mouth at bedtime.   warfarin 2.5 MG tablet Commonly known as: COUMADIN Take 2.5 mg by mouth daily.       No orders of the defined types were placed in this encounter.    There is no immunization history on file for this patient.  Social History   Tobacco Use  . Smoking status: Former Smoker    Types: Cigarettes    Quit date: 06/30/2004    Years since quitting: 14.9  . Smokeless tobacco: Never Used  Substance Use Topics  . Alcohol use: No    Family history is   Family History  Problem Relation Age of Onset  . Alzheimer's disease Mother   . Diabetes Father  Amputation:  bilateral legs  . Cancer Daughter        breast cancer  . Diabetes Son   . Heart disease Son        before age 77  . Hypertension Son   . Anesthesia problems Neg Hx       Review of Systems  DATA OBTAINED: from patient, nurse GENERAL:  no fevers, fatigue, appetite changes SKIN: No itching, or rash EYES: No eye pain, redness, discharge EARS: No earache, tinnitus, change in hearing NOSE: No congestion, drainage or bleeding  MOUTH/THROAT: No mouth or tooth pain, No sore throat RESPIRATORY: No cough, wheezing, SOB CARDIAC: No chest pain, palpitations, lower extremity edema  GI: No abdominal pain, No N/V/D or constipation, No heartburn or reflux  GU: No dysuria, frequency or urgency, or incontinence  MUSCULOSKELETAL: No unrelieved bone/joint pain NEUROLOGIC: No headache, dizziness or focal weakness PSYCHIATRIC: No c/o anxiety or sadness   Vitals:   06/09/19 1630  BP: (!) 148/66  Pulse: 74  Resp: 18  Temp: 98 F (36.7 C)    SpO2 Readings from Last 1 Encounters:  05/31/19 96%   Body mass index is 19.05 kg/m.     Physical Exam  GENERAL APPEARANCE: Alert,   No acute distress.  SKIN: No diaphoresis rash HEAD: Normocephalic, atraumatic  EYES: Conjunctiva/lids clear. Pupils round,  reactive. EOMs intact.  EARS: External exam WNL, canals clear. Hearing grossly normal.  NOSE: No deformity or discharge.  MOUTH/THROAT: Lips w/o lesions  RESPIRATORY: Breathing is even, unlabored. Lung sounds are clear   CARDIOVASCULAR: Heart RRR no murmurs, rubs or gallops. No peripheral edema.   GASTROINTESTINAL: Abdomen is soft, non-tender, not distended w/ normal bowel sounds. GENITOURINARY: Bladder non tender, not distended  MUSCULOSKELETAL: Bilateral AKA NEUROLOGIC:  Cranial nerves 2-12 grossly intact. Moves all extremities  PSYCHIATRIC: Mood and affect appropriate with dementia, no behavioral issues  Patient Active Problem List   Diagnosis Date Noted  . 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 End stage renal disease on dialysis due to type 2 diabetes mellitus ([place]) 03/24/2019  . 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 Anemia due to end stage renal disease ([place]) 03/24/2019  . Protein-calorie malnutrition, severe ([place]) 03/24/2019  . Hyperlipidemia associated with type 2 diabetes mellitus ([place]) 02/22/2019  . Amputation stump infection ([place]) 02/15/2019  . Conjunctivitis of left eye 01/04/2019  . 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[place]) 12/30/2018  . Open wound of left foot   . Anticoagulation management encounter   . OSA (obstructive sleep apnea)   . HTN (hypertension)   . ESRD on hemodialysis ([place])   . BPH (benign prostatic hyperplasia)   . Hypotension 12/07/2018  . GERD (gastroesophageal reflux disease) 12/07/2018  . Pressure injury of skin 12/03/2018  . Symptomatic anemia 12/02/2018  . Acute on chronic blood loss anemia 12/02/2018  . 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[place]) 12/02/2018  . Lower GI bleeding 12/02/2018  . Heme positive stool   . Acute on chronic diastolic (congestive) heart failure ([place]) 11/19/2018  . Acute CVA  (cerebrovascular accident) ([place]) 11/19/2018  . Hypoglycemia 11/19/2018  . Peripheral vascular disease due to secondary diabetes ([place]) 11/19/2018  . Dry gangrene ([place]) 11/19/2018  . 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 DNR (do not resuscitate) discussion   . Goals of care, counseling/discussion   . Palliative care by specialist   .  Acute hypoxemic respiratory failure ([place]) 11/06/2018  . Volume overload 11/06/2018  . Multifocal pneumonia 11/06/2018  . Acute metabolic encephalopathy 19/41/7408  . Physical deconditioning 11/06/2018  . Acute respiratory failure with hypoxia and hypercapnia ([place]) 11/04/2018  . CAP (community acquired pneumonia) 11/04/2018  . Pleural effusion, right 11/04/2018  . Increased ammonia level 11/04/2018  . Abnormal CT of the abdomen 11/04/2018  . Atrial fibrillation with RVR ([place]) 11/04/2018  . Sepsis ([place]) 10/21/2018  . Acute encephalopathy 10/21/2018  . Elevated alkaline phosphatase level 10/21/2018  . Chronic anemia 10/21/2018  . Thrombocytopenia ([place]) 10/21/2018  . Pulmonary HTN ([place]) 03/04/2017  . Aortic valve stenosis 03/04/2017  . Hypertension, accelerated with heart disease, without CHF 01/12/2017  . Dyspnea on exertion 01/12/2017  . ESRD (end stage renal disease) ([place]) 06/07/2012  . Hyperlipidemia 04/24/2011  . HYPERTENSION, BENIGN 10/24/2009  . Type 2 diabetes mellitus with hypertension and end stage renal disease on dialysis ([place]) 10/23/2009  . OBSTRUCTIVE SLEEP APNEA 10/23/2009  . PAF (paroxysmal atrial fibrillation) ([place]) 10/23/2009  . CHF (congestive heart failure) ([place]) 10/23/2009      Labs reviewed: Basic Metabolic Panel:    Component Value Date/Time   NA 136 05/31/2019 1319   NA 141 02/27/2019   K 3.6 05/31/2019 1319   CL 98 05/31/2019 1319   CO2 24 05/31/2019 1319   GLUCOSE 140 (H) 05/31/2019 1319   BUN 44 (H) 05/31/2019 1319   BUN 64 (A) 02/27/2019   CREATININE 4.29 (H) 05/31/2019 1319   CALCIUM 9.1  05/31/2019 1319   CALCIUM 8.3 (L) 10/25/2008 1450   PROT 6.5 05/28/2019 0253   ALBUMIN 2.3 (L) 05/31/2019 1319   AST 31 05/28/2019 0253   ALT 42 05/28/2019 0253   ALKPHOS 261 (H) 05/28/2019 0253   BILITOT 0.6 05/28/2019 0253   GFRNONAA 12 (L) 05/31/2019 1319   GFRAA 14 (L) 05/31/2019 1319    Recent Labs    02/20/19 1300  05/28/19 0253 05/29/19 0541 05/29/19 2316 05/30/19 0727 05/31/19 1319  NA 137   < > 137  --   --  137 136  K 3.0*   < > 3.7  --   --  5.1 3.6  CL 101   < > 102  --   --  101 98  CO2 23   < > 21*  --   --  17* 24  GLUCOSE 101*   < > 151*  --   --  99 140*  BUN 27*   < > 80*  --   --  158* 44*  CREATININE 3.41*   < > 6.12*  --   --  8.32* 4.29*  CALCIUM 7.7*   < > 9.3  --   --  10.0 9.1  MG  --    < > 2.0 2.1 2.4  --   --   PHOS 2.4*  --   --   --   --  2.6 3.0   < > = values in this interval not displayed.   Liver Function Tests: Recent Labs    05/26/19 0214 05/27/19 0348 05/28/19 0253 05/30/19 0727 05/31/19 1319  AST 49* 55* 31  --   --   ALT 44 55* 42  --   --   ALKPHOS 235* 272* 261*  --   --   BILITOT 0.7 0.8 0.6  --   --   PROT 6.8 7.0 6.5  --   --   ALBUMIN 2.4* 2.4* 2.2* 2.3* 2.3*  Recent Labs    10/21/18 0520 10/24/18 0614  LIPASE 35 32   Recent Labs    10/26/18 0538 11/06/18 1850 11/09/18 0230  AMMONIA 42* 40* 42*   CBC: Recent Labs    12/24/18 0227  02/15/19 1242  05/25/19 1421  05/29/19 2316 05/30/19 0727 05/31/19 1319  WBC 15.6*   < > 14.7*   < > 17.5*   < > 15.8* 14.6* 10.3  NEUTROABS 13.0*  --  11.8*  --  14.2*  --   --   --   --   HGB 10.5*   < > 10.1*   < > 10.7*   < > 6.8* 7.4* 7.6*  HCT 33.5*   < > 35.1*   < > 35.1*   < > 21.8* 23.1* 24.7*  MCV 99.4   < > 90.5   < > 89.1   < > 86.2 85.9 88.5  PLT 204   < > 227   < > 251   < > 206 192 220   < > = values in this interval not displayed.   Lipid Recent Labs    11/11/18 1517  CHOL 113  HDL 39*  LDLCALC 62  TRIG 60    Cardiac Enzymes: Recent Labs     11/06/18 1855  TROPONINI <0.03   BNP: Recent Labs    11/06/18 1905  BNP 1,611.6*   No results found for: [facility] Lab Results  Component Value Date   HGBA1C 4.9 02/16/2019   Lab Results  Component Value Date   TSH 1.839 05/25/2019   Lab Results  Component Value Date   NFAOZHYQ65 784 11/11/2018   Lab Results  Component Value Date   FOLATE  10/17/2008    >20.0 (NOTE)  Reference Ranges        Deficient:       0.4 - 3.3 ng/mL        Indeterminate:   3.4 - 5.4 ng/mL        Normal:              > 5.4 ng/mL   Lab Results  Component Value Date   IRON 48 06/05/2011   TIBC 194 (L) 06/05/2011   FERRITIN 872 (H) 06/05/2011    Imaging and Procedures obtained prior to SNF admission: Dg Chest Port 1 View  Result Date: 05/25/2019 CLINICAL DATA:  Sepsis. EXAM: PORTABLE CHEST 1 VIEW COMPARISON:  Single-view of the chest 02/15/2019 and 10/22/2018. PA and lateral chest 12/27/2018. FINDINGS: Lung volumes are low with some crowding of the bronchovascular structures. Mild vascular congestion is seen. Heart size is upper normal. Aortic atherosclerosis noted. No pneumothorax or pleural effusion. IMPRESSION: Mild pulmonary vascular congestion.  No focal airspace disease. Atherosclerosis. Electronically Signed   By: Inge Rise M.D.   On: 05/25/2019 15:51   Dg Femur Portable Min 2 Views Right  Result Date: 05/25/2019 CLINICAL DATA:  78 year old male with a history of potential osteomyelitis EXAM: RIGHT FEMUR PORTABLE 2 VIEW COMPARISON:  02/15/2019 FINDINGS: Surgical changes of right above knee amputation. There are no erosive changes at the osteotomy site. There has been development of a small bone spur on the medial aspect of the distal cortex, in the region of the previous MRI findings. Triangular shaped focus of heterotopic bone within the soft tissues on the oblique image. No subcutaneous gas or focal soft tissue swelling identified. Vascular calcifications. IMPRESSION: No erosive changes  at the right femur osteotomy site that would suggest acute osteomyelitis. Focal  bone spur and heterotopic ossification within the soft tissues, potentially postoperative changes versus chronic reactive changes. Electronically Signed   By: Corrie Mckusick D.O.   On: 05/25/2019 15:54     Not all labs, radiology exams or other studies done during hospitalization come through on my EPIC note; however they are reviewed by me.    Assessment and Plan  No problem-specific Assessment & Plan notes found for this encounter.   Hennie Duos, MD

## 2019-06-11 ENCOUNTER — Encounter: Payer: Self-pay | Admitting: Internal Medicine

## 2019-06-22 ENCOUNTER — Non-Acute Institutional Stay (SKILLED_NURSING_FACILITY): Payer: Medicare Other | Admitting: Internal Medicine

## 2019-06-22 DIAGNOSIS — I480 Paroxysmal atrial fibrillation: Secondary | ICD-10-CM

## 2019-06-22 DIAGNOSIS — Z7901 Long term (current) use of anticoagulants: Secondary | ICD-10-CM

## 2019-06-22 DIAGNOSIS — Z5181 Encounter for therapeutic drug level monitoring: Secondary | ICD-10-CM

## 2019-06-25 ENCOUNTER — Encounter: Payer: Self-pay | Admitting: Internal Medicine

## 2019-06-25 NOTE — Progress Notes (Signed)
Location:   [facility] Service:   SNF  [name], Edward Delaine, MD  Patient Care Team: Hennie Duos, MD as PCP - General (Internal Medicine) [name], MD as Consulting Physician (Nephrology) Re[facility], [facility]  Extended Emergency Contact Information Primary Emergency Contact: [name],Edward Address: [place], [place] [address] [name] of [place] Phone: [phone] Work Phone: [phone] Mobile Phone: [phone] Relation: Daughter Secondary Emergency Contact: [name] Mobile Phone: [phone] Relation: Other    Allergies: Occlusive silicone sheets [silicone], Other, and Tape  Chief Complaint  Patient presents with  . Acute Visit    HPI: Patient is 78 y.o. male who is being seen to follow-up his INR for atrial fibrillation.  Patient's INR today is 1.6 and he is on 2.5 mg daily.  There have been no reported problems in relation to what his INR is  Past Medical History:  Diagnosis Date  . A-fib ([place])   . Anemia   . Blood transfusion   . BPH (benign prostatic hyperplasia)   . CHF (congestive heart failure) ([place])   . Diarrhea   . DM (diabetes mellitus) ([place])   . ESRD on hemodialysis ([place])    Started dialysis in 2009  . History of GI bleed    secondary to coumadin  . HTN (hypertension)   . Hyperlipidemia   . OSA (obstructive sleep apnea)    uses CPAP  . Secondary hyperparathyroidism of renal origin [facility])     Past Surgical History:  Procedure Laterality Date  . ABDOMINAL AORTOGRAM N/A 11/15/2018   Procedure: ABDOMINAL AORTOGRAM;  Surgeon: [name], MD;  Location: [place] [facility];  Service: Cardiovascular;  Laterality: N/A;  . AMPUTATION Left 12/06/2018   Procedure: AMPUTATION DIGIT LEFT FIFTH TOE;  Surgeon: Angelia Mould, MD;  Location: [place];  Service: Vascular;  Laterality: Left;  . AMPUTATION Bilateral 12/29/2018   Procedure: AMPUTATION ABOVE KNEE;  Surgeon: Waynetta Sandy, MD;  Location: [place];  Service: Vascular;  Laterality: Bilateral;  . APPLICATION OF WOUND VAC Right 02/17/2019   Procedure: Application Of Wound Vac;  Surgeon: Marty Heck, MD;  Location: [place];  Service: Vascular;  Laterality: Right;  . BVT  1/69/67   Left  Basilic Vein Transposition  . CHOLECYSTECTOMY    . EYE SURGERY     Catarct bil  . I&D EXTREMITY Right 02/17/2019   Procedure: Right above the kneee debridement;  Surgeon: Marty Heck, MD;  Location: [place];  Service: Vascular;  Laterality: Right;  . INSERTION OF DIALYSIS CATHETER  05/28/2012   Procedure: INSERTION OF DIALYSIS CATHETER;  Surgeon: Mal Misty, MD;  Location: [place];  Service: Vascular;  Laterality: Right;  . Left arm shuntogram.    . Left forearm loop graft with 6 mm Gore-Tex graft.    . LOWER EXTREMITY ANGIOGRAPHY Bilateral 11/15/2018   Procedure: LOWER EXTREMITY ANGIOGRAPHY;  Surgeon: [name], MD;  Location: [place] [facility];  Service: Cardiovascular;  Laterality: Bilateral;  . Pars plana vitrectomy with 25-gauge system    . PERIPHERAL VASCULAR ATHERECTOMY Left 11/15/2018   Procedure: PERIPHERAL VASCULAR ATHERECTOMY;  Surgeon: [name], MD;  Location: [place] [facility];  Service: Cardiovascular;  Laterality: Left;  SFA with STENT  . PERIPHERAL VASCULAR BALLOON ANGIOPLASTY Left 11/15/2018   Procedure: PERIPHERAL VASCULAR BALLOON ANGIOPLASTY;  Surgeon: [name],  MD;  Location: [place] [facility];  Service: Cardiovascular;  Laterality: Left;  PT    Allergies as of 06/22/2019      Reactions   Occlusive Silicone Sheets [silicone] Other (See Comments)   "Allergic," per [facility]   Other Other (See Comments)   Unknown reaction to Occlusive adhesive- "Allergic," per [facility]   Tape Itching, Other (See Comments)   Use Cloth tape only, please      Medication List       Accurate as of June 22, 2019 11:59 PM. If you have  any questions, ask your nurse or doctor.        acetaminophen 500 MG tablet Commonly known as: TYLENOL Take 1,000 mg by mouth 3 (three) times daily.   ADULT NUTRITIONAL SUPPLEMENT PO Take by mouth. Magic cup with dinner   atorvastatin 20 MG tablet Commonly known as: LIPITOR Take 20 mg by mouth daily.   calcium acetate 667 MG capsule Commonly known as: PHOSLO Take 1,334 mg by mouth 3 (three) times daily with meals.   dicyclomine 20 MG tablet Commonly known as: BENTYL Take 20 mg by mouth 3 (three) times daily before meals.   doxercalciferol 4 MCG/2ML injection Commonly known as: HECTOROL Inject 1 mL (2 mcg total) into the vein every Monday, Wednesday, and Friday with hemodialysis.   feeding supplement (PRO-STAT SUGAR FREE 64) Liqd Take 30 mLs by mouth 2 (two) times daily. 9a and 5p   HYDROcodone-acetaminophen 5-325 MG tablet Commonly known as: NORCO/VICODIN Take 1 tablet by mouth every 6 (six) hours as needed for severe pain. For no more than 14 days   loperamide 2 MG tablet Commonly known as: IMODIUM A-D Take 4 mg by mouth See admin instructions. Take 2 tablets ( 4 mg) by mouth after 1st loose stool as needed for diarrhea   metoprolol tartrate 25 MG tablet Commonly known as: LOPRESSOR Take 25 mg by mouth See admin instructions. Take 25 mg by mouth two times a day on Sun/Tues/Thurs/Sat and 25 mg mg at bedtime on Mon/Wed/Fri   midodrine 10 MG tablet Commonly known as: PROAMATINE Take one tablet (10 mg) by mouth on Monday, Wednesday, Friday 30 minutes prior to dialysis.   multivitamin with minerals Tabs tablet Take 1 tablet by mouth daily.   pantoprazole 40 MG tablet Commonly known as: PROTONIX Take 1 tablet (40 mg total) by mouth daily.   RENAL VITAMIN PO Take 1 tablet by mouth at bedtime.   warfarin 2.5 MG tablet Commonly known as: COUMADIN Take 2.5 mg by mouth daily.       No orders of the defined types were placed in this encounter.    There is no  immunization history on file for this patient.  Social History   Tobacco Use  . Smoking status: Former Smoker    Types: Cigarettes    Quit date: 06/30/2004    Years since quitting: 14.9  . Smokeless tobacco: Never Used  Substance Use Topics  . Alcohol use: No    Review of Systems  DATA OBTAINED: from nurse GENERAL:  no fevers, fatigue, appetite changes SKIN: No itching, rash HEENT: No complaint RESPIRATORY: No cough, wheezing, SOB CARDIAC: No chest pain, palpitations, lower extremity edema  GI: No abdominal pain, No N/V/D or constipation, No heartburn or reflux  GU: No dysuria, frequency or urgency, or incontinence  MUSCULOSKELETAL: No unrelieved bone/joint pain NEUROLOGIC: No headache, dizziness  PSYCHIATRIC: No overt anxiety or sadness  Vitals:   06/25/19 1644  BP: 123/76  Pulse: 72  Resp: 18  Temp: (!) 97.3 F (36.3 C)   There is no height or weight on file to calculate BMI. Physical Exam  GENERAL APPEARANCE: Alert, conversant, No acute distress  SKIN: No diaphoresis rash HEENT: Unremarkable RESPIRATORY: Breathing is even, unlabored. L  CARDIOVASCULAR:. No peripheral edema  GASTROINTESTINAL: not distended w/ normal bowel sounds.  NEUROLOGIC: Cranial nerves 2-12 grossly intact. Moves all extremities PSYCHIATRIC: Mood and affect appropriate to situation, no behavioral issues  Patient Active Problem List   Diagnosis Date Noted  . 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 End stage renal disease on dialysis due to type 2 diabetes mellitus ([place]) 03/24/2019  . 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 Anemia due to end stage renal disease ([place]) 03/24/2019  . Protein-calorie malnutrition, severe ([place]) 03/24/2019  . Hyperlipidemia associated with type 2 diabetes mellitus ([place]) 02/22/2019  . Amputation stump infection ([place])  02/15/2019  . Conjunctivitis of left eye 01/04/2019  . 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[place]) 12/30/2018  . Open wound of left foot   . Anticoagulation management encounter   . OSA (obstructive sleep apnea)   . HTN (hypertension)   . ESRD on hemodialysis ([place])   . BPH (benign prostatic hyperplasia)   . Hypotension 12/07/2018  . GERD (gastroesophageal reflux disease) 12/07/2018  . Pressure injury of skin 12/03/2018  . Symptomatic anemia 12/02/2018  . Acute on chronic blood loss anemia 12/02/2018  . 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[place]) 12/02/2018  . Lower GI bleeding 12/02/2018  . Heme positive stool   . Acute on chronic diastolic (congestive) heart failure ([place]) 11/19/2018  . Acute CVA (cerebrovascular accident) ([place]) 11/19/2018  . Hypoglycemia 11/19/2018  . Peripheral vascular disease due to secondary diabetes ([place]) 11/19/2018  . Dry gangrene ([place]) 11/19/2018  . 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 DNR (do not resuscitate) discussion   . Goals of care, counseling/discussion   . Palliative care by specialist   . Acute hypoxemic respiratory failure ([place]) 11/06/2018  . Volume overload 11/06/2018  . Multifocal pneumonia 11/06/2018  . Acute metabolic encephalopathy 26/71/2458  . Physical deconditioning 11/06/2018  . Acute respiratory failure with hypoxia and hypercapnia ([place]) 11/04/2018  . CAP (community acquired pneumonia) 11/04/2018  . Pleural effusion, right 11/04/2018  . Increased ammonia level 11/04/2018  . Abnormal CT of the abdomen 11/04/2018  . Atrial fibrillation with RVR ([place]) 11/04/2018  . Sepsis ([place]) 10/21/2018  . Acute encephalopathy 10/21/2018  . Elevated alkaline phosphatase level 10/21/2018  . Chronic anemia 10/21/2018  . Thrombocytopenia ([place]) 10/21/2018  . Pulmonary HTN ([place]) 03/04/2017  . Aortic valve stenosis 03/04/2017  . Hypertension, accelerated with heart disease, without CHF 01/12/2017  . Dyspnea on exertion  01/12/2017  . ESRD (end stage renal disease) ([place]) 06/07/2012  . Hyperlipidemia 04/24/2011  . HYPERTENSION, BENIGN 10/24/2009  . Type 2 diabetes mellitus with hypertension and end stage renal disease on dialysis ([place]) 10/23/2009  . OBSTRUCTIVE SLEEP APNEA 10/23/2009  . PAF (paroxysmal atrial fibrillation) ([place]) 10/23/2009  . CHF (congestive heart failure) ([place]) 10/23/2009    CMP     Component Value Date/Time   NA 136 05/31/2019 1319   NA 141 02/27/2019   K 3.6 05/31/2019 1319   CL 98 05/31/2019 1319   CO2 24 05/31/2019 1319   GLUCOSE 140 (H) 05/31/2019 1319   BUN 44 (H) 05/31/2019  1319   BUN 64 (A) 02/27/2019   CREATININE 4.29 (H) 05/31/2019 1319   CALCIUM 9.1 05/31/2019 1319   CALCIUM 8.3 (L) 10/25/2008 1450   PROT 6.5 05/28/2019 0253   ALBUMIN 2.3 (L) 05/31/2019 1319   AST 31 05/28/2019 0253   ALT 42 05/28/2019 0253   ALKPHOS 261 (H) 05/28/2019 0253   BILITOT 0.6 05/28/2019 0253   GFRNONAA 12 (L) 05/31/2019 1319   GFRAA 14 (L) 05/31/2019 1319   Recent Labs    02/20/19 1300  05/28/19 0253 05/29/19 0541 05/29/19 2316 05/30/19 0727 05/31/19 1319  NA 137   < > 137  --   --  137 136  K 3.0*   < > 3.7  --   --  5.1 3.6  CL 101   < > 102  --   --  101 98  CO2 23   < > 21*  --   --  17* 24  GLUCOSE 101*   < > 151*  --   --  99 140*  BUN 27*   < > 80*  --   --  158* 44*  CREATININE 3.41*   < > 6.12*  --   --  8.32* 4.29*  CALCIUM 7.7*   < > 9.3  --   --  10.0 9.1  MG  --    < > 2.0 2.1 2.4  --   --   PHOS 2.4*  --   --   --   --  2.6 3.0   < > = values in this interval not displayed.   Recent Labs    05/26/19 0214 05/27/19 0348 05/28/19 0253 05/30/19 0727 05/31/19 1319  AST 49* 55* 31  --   --   ALT 44 55* 42  --   --   ALKPHOS 235* 272* 261*  --   --   BILITOT 0.7 0.8 0.6  --   --   PROT 6.8 7.0 6.5  --   --   ALBUMIN 2.4* 2.4* 2.2* 2.3* 2.3*   Recent Labs    12/24/18 0227  02/15/19 1242  05/25/19 1421  05/29/19 2316 05/30/19 0727 05/31/19 1319   WBC 15.6*   < > 14.7*   < > 17.5*   < > 15.8* 14.6* 10.3  NEUTROABS 13.0*  --  11.8*  --  14.2*  --   --   --   --   HGB 10.5*   < > 10.1*   < > 10.7*   < > 6.8* 7.4* 7.6*  HCT 33.5*   < > 35.1*   < > 35.1*   < > 21.8* 23.1* 24.7*  MCV 99.4   < > 90.5   < > 89.1   < > 86.2 85.9 88.5  PLT 204   < > 227   < > 251   < > 206 192 220   < > = values in this interval not displayed.   Recent Labs    11/11/18 1517  CHOL 113  LDLCALC 62  TRIG 60   No results found for: [facility] Lab Results  Component Value Date   TSH 1.839 05/25/2019   Lab Results  Component Value Date   HGBA1C 4.9 02/16/2019   Lab Results  Component Value Date   CHOL 113 11/11/2018   HDL 39 (L) 11/11/2018   LDLCALC 62 11/11/2018   TRIG 60 11/11/2018   CHOLHDL 2.9 11/11/2018    Significant Diagnostic Results in last  30 days:  No results found.  Assessment and Plan  Paroxysmal atrial fibrillation/chronic Coumadin use- will increase patient to 5 mg today Friday and 5 mg on Monday with 2.5 mg all other days and will repeat his INR in 1 week on 7/2    Hennie Duos, MD

## 2019-06-29 ENCOUNTER — Encounter: Payer: Self-pay | Admitting: Internal Medicine

## 2019-06-29 ENCOUNTER — Non-Acute Institutional Stay (SKILLED_NURSING_FACILITY): Payer: Medicare Other | Admitting: Internal Medicine

## 2019-06-29 DIAGNOSIS — I480 Paroxysmal atrial fibrillation: Secondary | ICD-10-CM

## 2019-06-29 DIAGNOSIS — Z5181 Encounter for therapeutic drug level monitoring: Secondary | ICD-10-CM | POA: Diagnosis not present

## 2019-06-29 DIAGNOSIS — Z7901 Long term (current) use of anticoagulants: Secondary | ICD-10-CM | POA: Diagnosis not present

## 2019-06-29 NOTE — Progress Notes (Signed)
Location:  [facility] Room Number: 308-W Place of Service:  SNF (31)  Hennie Duos, MD  Patient Care Team: Hennie Duos, MD as PCP - General (Internal Medicine) [name], MD as Consulting Physician (Nephrology) [facility], [place] [facility], [facility]  Extended Emergency Contact Information Primary Emergency Contact: [name],[name] Address: [place], [place] [address] [name] of [place] Phone: [phone] Work Phone: [phone] Mobile Phone: [phone] Relation: Daughter Secondary Emergency Contact: [name] Mobile Phone: [phone] Relation: Other    Allergies: Occlusive silicone sheets [silicone], Other, and Tape  Chief Complaint  Patient presents with  . Acute Visit    INR    HPI: Patient is a 78 y.o. male who who is being seen for INR check.  Patient's INR is 2.0 on 2.5 mg for 6 days a week and 5 mg 1 day a week.  Patient has no complaints.   Past Medical History:  Diagnosis Date  . A-fib ([place])   . Anemia   . Blood transfusion   . BPH (benign prostatic hyperplasia)   . CHF (congestive heart failure) ([place])   . Diarrhea   . DM (diabetes mellitus) ([place])   . ESRD on hemodialysis ([place])    Started dialysis in 2009  . History of GI bleed    secondary to coumadin  . HTN (hypertension)   . Hyperlipidemia   . OSA (obstructive sleep apnea)    uses CPAP  . Secondary hyperparathyroidism of renal origin [facility])     Past Surgical History:  Procedure Laterality Date  . ABDOMINAL AORTOGRAM N/A 11/15/2018   Procedure: ABDOMINAL AORTOGRAM;  Surgeon: [name], MD;  Location: [place] [facility];  Service: Cardiovascular;  Laterality: N/A;  . AMPUTATION Left 12/06/2018   Procedure: AMPUTATION DIGIT LEFT FIFTH TOE;  Surgeon: Angelia Mould, MD;  Location: [facility];  Service: Vascular;  Laterality: Left;  . AMPUTATION Bilateral  12/29/2018   Procedure: AMPUTATION ABOVE KNEE;  Surgeon: Waynetta Sandy, MD;  Location: [place];  Service: Vascular;  Laterality: Bilateral;  . APPLICATION OF WOUND VAC Right 02/17/2019   Procedure: Application Of Wound Vac;  Surgeon: Marty Heck, MD;  Location: [place];  Service: Vascular;  Laterality: Right;  . BVT  08/21/04   Left  Basilic Vein Transposition  . CHOLECYSTECTOMY    . EYE SURGERY     Catarct bil  . I&D EXTREMITY Right 02/17/2019   Procedure: Right above the kneee debridement;  Surgeon: Marty Heck, MD;  Location: [facility] [place];  Service: Vascular;  Laterality: Right;  . INSERTION OF DIALYSIS CATHETER  05/28/2012   Procedure: INSERTION OF DIALYSIS CATHETER;  Surgeon: Mal Misty, MD;  Location: [facility];  Service: Vascular;  Laterality: Right;  . Left arm shuntogram.    . Left forearm loop graft with 6 mm Gore-Tex graft.    . LOWER EXTREMITY ANGIOGRAPHY Bilateral 11/15/2018   Procedure: LOWER EXTREMITY ANGIOGRAPHY;  Surgeon: [name], MD;  Location: [place] [facility];  Service: Cardiovascular;  Laterality: Bilateral;  . Pars plana vitrectomy with 25-gauge system    . PERIPHERAL VASCULAR ATHERECTOMY Left 11/15/2018   Procedure: PERIPHERAL VASCULAR ATHERECTOMY;  Surgeon: [name], MD;  Location: [place] [facility];  Service: Cardiovascular;  Laterality: Left;  SFA with STENT  . PERIPHERAL VASCULAR BALLOON ANGIOPLASTY Left 11/15/2018   Procedure: PERIPHERAL VASCULAR  BALLOON ANGIOPLASTY;  Surgeon: [name], MD;  Location: [place] [facility];  Service: Cardiovascular;  Laterality: Left;  PT    Allergies as of 06/29/2019      Reactions   Occlusive Silicone Sheets [silicone] Other (See Comments)   "Allergic," per [facility]   Other Other (See Comments)   Unknown reaction to Occlusive adhesive- "Allergic," per [facility]   Tape Itching, Other (See Comments)   Use Cloth tape only, please      Medication List       Accurate as of June 29, 2019  3:25 PM. If  you have any questions, ask your nurse or doctor.        STOP taking these medications   HYDROcodone-acetaminophen 5-325 MG tablet Commonly known as: NORCO/VICODIN Stopped by: Inocencio Homes, MD   multivitamin with minerals Tabs tablet Stopped by: Inocencio Homes, MD     TAKE these medications   acetaminophen 500 MG tablet Commonly known as: TYLENOL Take 1,000 mg by mouth 3 (three) times daily.   ADULT NUTRITIONAL SUPPLEMENT PO Take by mouth. Magic cup with dinner   atorvastatin 20 MG tablet Commonly known as: LIPITOR Take 20 mg by mouth daily.   calcium acetate 667 MG capsule Commonly known as: PHOSLO Take 1,334 mg by mouth 3 (three) times daily with meals.   dicyclomine 20 MG tablet Commonly known as: BENTYL Take 20 mg by mouth 3 (three) times daily before meals.   doxercalciferol 4 MCG/2ML injection Commonly known as: HECTOROL Inject 1 mL (2 mcg total) into the vein every Monday, Wednesday, and Friday with hemodialysis.   feeding supplement (PRO-STAT SUGAR FREE 64) Liqd Take 30 mLs by mouth 2 (two) times daily. 9a and 5p   loperamide 2 MG tablet Commonly known as: IMODIUM A-D Take 4 mg by mouth See admin instructions. Take 2 tablets ( 4 mg) by mouth after 1st loose stool as needed for diarrhea   metoprolol tartrate 25 MG tablet Commonly known as: LOPRESSOR Take 25 mg by mouth See admin instructions. Take 25 mg by mouth two times a day on Sun/Tues/Thurs/Sat and 25 mg mg at bedtime on Mon/Wed/Fri   midodrine 10 MG tablet Commonly known as: PROAMATINE Take one tablet (10 mg) by mouth on Monday, Wednesday, Friday 30 minutes prior to dialysis.   pantoprazole 40 MG tablet Commonly known as: PROTONIX Take 1 tablet (40 mg total) by mouth daily.   RENAL VITAMIN PO Take 1 tablet by mouth at bedtime.   warfarin 2.5 MG tablet Commonly known as: COUMADIN Take 2.5 mg by mouth daily. Take everyday except Monday.   warfarin 5 MG tablet Commonly known as: COUMADIN  Take 5 mg by mouth See admin instructions. Take only on Monday.       No orders of the defined types were placed in this encounter.    There is no immunization history on file for this patient.  Social History   Tobacco Use  . Smoking status: Former Smoker    Types: Cigarettes    Quit date: 06/30/2004    Years since quitting: 15.0  . Smokeless tobacco: Never Used  Substance Use Topics  . Alcohol use: No     Vitals:   06/29/19 1507  BP: (!) 100/50  Pulse: 62  Resp: 18  Temp: (!) 96.3 F (35.7 C)  SpO2: 96%   Body mass index is 19.21 kg/m.   Patient Active Problem List   Diagnosis Date Noted  . Hypertensive heart and kidney disease with  chronic diastolic congestive heart failure and stage 5 chronic kidney disease on chronic dialysis ([place]) 03/24/2019  . 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 Anemia due to end stage renal disease ([place]) 03/24/2019  . Protein-calorie malnutrition, severe ([place]) 03/24/2019  . Hyperlipidemia associated with type 2 diabetes mellitus ([place]) 02/22/2019  . Amputation stump infection ([place]) 02/15/2019  . Conjunctivitis of left eye 01/04/2019  . 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[place]) 12/30/2018  . Open wound of left foot   . Encounter for monitoring Coumadin therapy   . OSA (obstructive sleep apnea)   . HTN (hypertension)   . ESRD on hemodialysis ([place])   . BPH (benign prostatic hyperplasia)   . Hypotension 12/07/2018  . GERD (gastroesophageal reflux disease) 12/07/2018  . Pressure injury of skin 12/03/2018  . Symptomatic anemia 12/02/2018  . Acute on chronic blood loss anemia 12/02/2018  . 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 Lower GI bleeding 12/02/2018  . Heme positive stool   . Acute on chronic diastolic (congestive) heart failure ([place]) 11/19/2018  . Acute CVA (cerebrovascular accident) ([place])  11/19/2018  . Hypoglycemia 11/19/2018  . Peripheral vascular disease due to secondary diabetes ([place]) 11/19/2018  . Dry gangrene ([place]) 11/19/2018  . 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 DNR (do not resuscitate) discussion   . Goals of care, counseling/discussion   . Palliative care by specialist   . Acute hypoxemic respiratory failure ([place]) 11/06/2018  . Volume overload 11/06/2018  . Multifocal pneumonia 11/06/2018  . Acute metabolic encephalopathy 92/42/6834  . Physical deconditioning 11/06/2018  . Acute respiratory failure with hypoxia and hypercapnia ([place]) 11/04/2018  . CAP (community acquired pneumonia) 11/04/2018  . Pleural effusion, right 11/04/2018  . Increased ammonia level 11/04/2018  . Abnormal CT of the abdomen 11/04/2018  . Atrial fibrillation with RVR ([place]) 11/04/2018  . Sepsis ([place]) 10/21/2018  . Acute encephalopathy 10/21/2018  . Elevated alkaline phosphatase level 10/21/2018  . Chronic anemia 10/21/2018  . Thrombocytopenia ([place]) 10/21/2018  . Pulmonary HTN ([place]) 03/04/2017  . Aortic valve stenosis 03/04/2017  . Hypertension, accelerated with heart disease, without CHF 01/12/2017  . Dyspnea on exertion 01/12/2017  . ESRD (end stage renal disease) ([place]) 06/07/2012  . Hyperlipidemia 04/24/2011  . HYPERTENSION, BENIGN 10/24/2009  . Type 2 diabetes mellitus with hypertension and end stage renal disease on dialysis ([place]) 10/23/2009  . OBSTRUCTIVE SLEEP APNEA 10/23/2009  . PAF (paroxysmal atrial fibrillation) ([place]) 10/23/2009  . CHF (congestive heart failure) ([place]) 10/23/2009    CMP     Component Value Date/Time   NA 136 05/31/2019 1319   NA 141 02/27/2019   K 3.6 05/31/2019 1319   CL 98 05/31/2019 1319   CO2 24 05/31/2019 1319   GLUCOSE 140 (H) 05/31/2019 1319   BUN 44 (H) 05/31/2019 1319   BUN 64 (A) 02/27/2019   CREATININE 4.29 (H) 05/31/2019 1319   CALCIUM 9.1 05/31/2019 1319   CALCIUM 8.3 (L) 10/25/2008 1450   PROT 6.5 05/28/2019  0253   ALBUMIN 2.3 (L) 05/31/2019 1319   AST 31 05/28/2019 0253   ALT 42 05/28/2019 0253   ALKPHOS 261 (H) 05/28/2019 0253   BILITOT 0.6 05/28/2019 0253   GFRNONAA 12 (L) 05/31/2019 1319   GFRAA 14 (L) 05/31/2019 1319   Recent Labs    02/20/19 1300  05/28/19 0253 05/29/19 0541 05/29/19 2316 05/30/19 1962  05/31/19 1319  NA 137   < > 137  --   --  137 136  K 3.0*   < > 3.7  --   --  5.1 3.6  CL 101   < > 102  --   --  101 98  CO2 23   < > 21*  --   --  17* 24  GLUCOSE 101*   < > 151*  --   --  99 140*  BUN 27*   < > 80*  --   --  158* 44*  CREATININE 3.41*   < > 6.12*  --   --  8.32* 4.29*  CALCIUM 7.7*   < > 9.3  --   --  10.0 9.1  MG  --    < > 2.0 2.1 2.4  --   --   PHOS 2.4*  --   --   --   --  2.6 3.0   < > = values in this interval not displayed.   Recent Labs    05/26/19 0214 05/27/19 0348 05/28/19 0253 05/30/19 0727 05/31/19 1319  AST 49* 55* 31  --   --   ALT 44 55* 42  --   --   ALKPHOS 235* 272* 261*  --   --   BILITOT 0.7 0.8 0.6  --   --   PROT 6.8 7.0 6.5  --   --   ALBUMIN 2.4* 2.4* 2.2* 2.3* 2.3*   Recent Labs    12/24/18 0227  02/15/19 1242  05/25/19 1421  05/29/19 2316 05/30/19 0727 05/31/19 1319  WBC 15.6*   < > 14.7*   < > 17.5*   < > 15.8* 14.6* 10.3  NEUTROABS 13.0*  --  11.8*  --  14.2*  --   --   --   --   HGB 10.5*   < > 10.1*   < > 10.7*   < > 6.8* 7.4* 7.6*  HCT 33.5*   < > 35.1*   < > 35.1*   < > 21.8* 23.1* 24.7*  MCV 99.4   < > 90.5   < > 89.1   < > 86.2 85.9 88.5  PLT 204   < > 227   < > 251   < > 206 192 220   < > = values in this interval not displayed.   Recent Labs    11/11/18 1517  CHOL 113  LDLCALC 62  TRIG 60   No results found for: [facility] Lab Results  Component Value Date   TSH 1.839 05/25/2019   Lab Results  Component Value Date   HGBA1C 4.9 02/16/2019   Lab Results  Component Value Date   CHOL 113 11/11/2018   HDL 39 (L) 11/11/2018   LDLCALC 62 11/11/2018   TRIG 60 11/11/2018   CHOLHDL 2.9  11/11/2018    Significant Diagnostic Results in last 30 days:  No results found.  Assessment and Plan  Atrial fibrillation /chronic Coumadin use- patient's INR has been running low so I am going to change his dosage to 5 mg Monday and Wednesday, and 2.5 mg every other day; repeat INR in 1 week    Hennie Duos , MD

## 2019-07-04 ENCOUNTER — Non-Acute Institutional Stay (SKILLED_NURSING_FACILITY): Payer: Medicare Other | Admitting: Internal Medicine

## 2019-07-04 ENCOUNTER — Encounter: Payer: Self-pay | Admitting: Internal Medicine

## 2019-07-04 DIAGNOSIS — K219 Gastro-esophageal reflux disease without esophagitis: Secondary | ICD-10-CM

## 2019-07-04 DIAGNOSIS — E1351 Other specified diabetes mellitus with diabetic peripheral angiopathy without gangrene: Secondary | ICD-10-CM | POA: Diagnosis not present

## 2019-07-04 DIAGNOSIS — G4733 Obstructive sleep apnea (adult) (pediatric): Secondary | ICD-10-CM

## 2019-07-04 NOTE — Progress Notes (Signed)
Location:  [place] Room Number: 308-W Place of Service:  SNF (31)  Edward Duos, MD  Patient Care Team: Edward Duos, MD as PCP - General (Internal Medicine) [name], MD as Consulting Physician (Nephrology) Rehab, [place] ([place], [facility]  Extended Emergency Contact Information Primary Emergency Contact: [name],[name] Address: [place], [place] [address] [name] of [place] Phone: [phone] Work Phone: [phone] Mobile Phone: [phone] Relation: Daughter Secondary Emergency Contact: [name] Mobile Phone: [phone] Relation: Other    Allergies: Occlusive silicone sheets [silicone], Other, and Tape  Chief Complaint  Patient presents with  . Medical Management of Chronic Issues    Routine [facility] SNF visit  . Best Practice Recommendations    Needs diabetic podiatry and ophthalmology exams    HPI: Patient is a 78 y.o. male who is being seen for routine issues of peripheral arterial disease, OSA, and GERD.  Past Medical History:  Diagnosis Date  . A-fib ([place])   . Anemia   . Blood transfusion   . BPH (benign prostatic hyperplasia)   . CHF (congestive heart failure) ([place])   . Diarrhea   . DM (diabetes mellitus) ([place])   . ESRD on hemodialysis ([place])    Started dialysis in 2009  . History of GI bleed    secondary to coumadin  . HTN (hypertension)   . Hyperlipidemia   . OSA (obstructive sleep apnea)    uses CPAP  . Secondary hyperparathyroidism of renal origin [facility])     Past Surgical History:  Procedure Laterality Date  . ABDOMINAL AORTOGRAM N/A 11/15/2018   Procedure: ABDOMINAL AORTOGRAM;  Surgeon: [name], MD;  Location: [place] [facility];  Service: Cardiovascular;  Laterality: N/A;  . AMPUTATION Left 12/06/2018   Procedure: AMPUTATION DIGIT LEFT FIFTH TOE;  Surgeon: Angelia Mould, MD;   Location: [place];  Service: Vascular;  Laterality: Left;  . AMPUTATION Bilateral 12/29/2018   Procedure: AMPUTATION ABOVE KNEE;  Surgeon: Waynetta Sandy, MD;  Location: [place];  Service: Vascular;  Laterality: Bilateral;  . APPLICATION OF WOUND VAC Right 02/17/2019   Procedure: Application Of Wound Vac;  Surgeon: Marty Heck, MD;  Location: [place];  Service: Vascular;  Laterality: Right;  . BVT  4/65/03   Left  Basilic Vein Transposition  . CHOLECYSTECTOMY    . EYE SURGERY     Catarct bil  . I&D EXTREMITY Right 02/17/2019   Procedure: Right above the kneee debridement;  Surgeon: Marty Heck, MD;  Location: [place];  Service: Vascular;  Laterality: Right;  . INSERTION OF DIALYSIS CATHETER  05/28/2012   Procedure: INSERTION OF DIALYSIS CATHETER;  Surgeon: Mal Misty, MD;  Location: [place];  Service: Vascular;  Laterality: Right;  . Left arm shuntogram.    . Left forearm loop graft with 6 mm Gore-Tex graft.    . LOWER EXTREMITY ANGIOGRAPHY Bilateral 11/15/2018   Procedure: LOWER EXTREMITY ANGIOGRAPHY;  Surgeon: [name], MD;  Location: [place] [facility];  Service: Cardiovascular;  Laterality: Bilateral;  . Pars plana vitrectomy with 25-gauge system    . PERIPHERAL VASCULAR ATHERECTOMY Left 11/15/2018   Procedure: PERIPHERAL VASCULAR ATHERECTOMY;  Surgeon: [name], MD;  Location: [place] [facility];  Service: Cardiovascular;  Laterality: Left;  SFA with STENT  . PERIPHERAL VASCULAR BALLOON ANGIOPLASTY Left 11/15/2018   Procedure: PERIPHERAL  VASCULAR BALLOON ANGIOPLASTY;  Surgeon: [name], MD;  Location: [place] [facility];  Service: Cardiovascular;  Laterality: Left;  PT    Allergies as of 07/04/2019      Reactions   Occlusive Silicone Sheets [silicone] Other (See Comments)   "Allergic," per [facility]   Other Other (See Comments)   Unknown reaction to Occlusive adhesive- "Allergic," per [facility]   Tape Itching, Other (See Comments)   Use Cloth tape only,  please      Medication List       Accurate as of July 04, 2019 11:59 PM. If you have any questions, ask your nurse or doctor.        acetaminophen 500 MG tablet Commonly known as: TYLENOL Take 1,000 mg by mouth 3 (three) times daily.   ADULT NUTRITIONAL SUPPLEMENT PO Take by mouth. Magic cup with dinner   atorvastatin 20 MG tablet Commonly known as: LIPITOR Take 20 mg by mouth daily.   calcium acetate 667 MG capsule Commonly known as: PHOSLO Take 1,334 mg by mouth 3 (three) times daily with meals.   dicyclomine 20 MG tablet Commonly known as: BENTYL Take 20 mg by mouth 3 (three) times daily before meals.   doxercalciferol 4 MCG/2ML injection Commonly known as: HECTOROL Inject 1 mL (2 mcg total) into the vein every Monday, Wednesday, and Friday with hemodialysis.   feeding supplement (PRO-STAT SUGAR FREE 64) Liqd Take 30 mLs by mouth 2 (two) times daily. 9a and 5p   loperamide 2 MG tablet Commonly known as: IMODIUM A-D Take 4 mg by mouth See admin instructions. Take 2 tablets ( 4 mg) by mouth after 1st loose stool as needed for diarrhea   metoprolol tartrate 25 MG tablet Commonly known as: LOPRESSOR Take 25 mg by mouth See admin instructions. Take 25 mg by mouth two times a day on Sun/Tues/Thurs/Sat and 25 mg at bedtime on Mon/Wed/Fri   midodrine 10 MG tablet Commonly known as: PROAMATINE Take one tablet (10 mg) by mouth on Monday, Wednesday, Friday 30 minutes prior to dialysis.   pantoprazole 40 MG tablet Commonly known as: PROTONIX Take 1 tablet (40 mg total) by mouth daily.   RENAL VITAMIN PO Take 1 tablet by mouth at bedtime.   warfarin 2.5 MG tablet Commonly known as: COUMADIN Take 2.5 mg by mouth See admin instructions. Take everyday except Monday.   warfarin 5 MG tablet Commonly known as: COUMADIN Take 5 mg by mouth See admin instructions. Take Mondays and Wednesdays       No orders of the defined types were placed in this encounter.    There  is no immunization history on file for this patient.  Social History   Tobacco Use  . Smoking status: Former Smoker    Types: Cigarettes    Quit date: 06/30/2004    Years since quitting: 15.0  . Smokeless tobacco: Never Used  Substance Use Topics  . Alcohol use: No    Review of Systems  DATA OBTAINED: from patient-limited; nursing-no acute concerns GENERAL:  no fevers, fatigue, appetite changes SKIN: No itching, rash HEENT: No complaint RESPIRATORY: No cough, wheezing, SOB CARDIAC: No chest pain, palpitations, lower extremity edema  GI: No abdominal pain, No N/V/D or constipation, No heartburn or reflux  GU: No dysuria, frequency or urgency, or incontinence  MUSCULOSKELETAL: No unrelieved bone/joint pain NEUROLOGIC: No headache, dizziness  PSYCHIATRIC: No overt anxiety or sadness  Vitals:   07/04/19 0908  BP: 106/64  Pulse: 85  Resp: 18  Temp: (!) 96.9 F (36.1 C)   Body mass index is 19.14 kg/m. Physical Exam  GENERAL APPEARANCE: Alert, conversant, No acute distress  SKIN: No diaphoresis rash HEENT: Unremarkable RESPIRATORY: Breathing is even, unlabored. Lung sounds are clear   CARDIOVASCULAR: Heart RRR no murmurs, rubs or gallops. No peripheral edema  GASTROINTESTINAL: Abdomen is soft, non-tender, not distended w/ normal bowel sounds.  GENITOURINARY: Bladder non tender, not distended  MUSCULOSKELETAL: Bilateral AKA NEUROLOGIC: Cranial nerves 2-12 grossly intact. Moves all extremities PSYCHIATRIC: Mood and affect appropriate to situation with some dementia, no behavioral issues  Patient Active Problem List   Diagnosis Date Noted  . 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 Anemia due to end stage  renal disease ([place]) 03/24/2019  . Protein-calorie malnutrition, severe ([place]) 03/24/2019  . Hyperlipidemia associated with type 2 diabetes mellitus ([place]) 02/22/2019  . Amputation stump infection ([place]) 02/15/2019  . Conjunctivitis of left eye 01/04/2019  . 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[place]) 12/30/2018  . Open wound of left foot   . Encounter for monitoring Coumadin therapy   . OSA (obstructive sleep apnea)   . HTN (hypertension)   . ESRD on hemodialysis ([place])   . BPH (benign prostatic hyperplasia)   . Hypotension 12/07/2018  . GERD (gastroesophageal reflux disease) 12/07/2018  . Pressure injury of skin 12/03/2018  . Symptomatic anemia 12/02/2018  . Acute on chronic blood loss anemia 12/02/2018  . 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[place]) 12/02/2018  . Lower GI bleeding 12/02/2018  . Heme positive stool   . Acute on chronic diastolic (congestive) heart failure ([place]) 11/19/2018  . Acute CVA (cerebrovascular accident) ([place]) 11/19/2018  . Hypoglycemia 11/19/2018  . Peripheral vascular disease due to secondary diabetes ([place]) 11/19/2018  . Dry gangrene ([place]) 11/19/2018  . 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 DNR (do not resuscitate) discussion   . Goals of care, counseling/discussion   . Palliative care by specialist   . Acute hypoxemic respiratory failure ([place]) 11/06/2018  . Volume overload 11/06/2018  . Multifocal pneumonia 11/06/2018  . Acute metabolic encephalopathy 29/47/6546  . Physical deconditioning 11/06/2018  . Acute respiratory failure with hypoxia and hypercapnia ([place]) 11/04/2018  . CAP (community acquired pneumonia) 11/04/2018  . Pleural effusion, right 11/04/2018  . Increased ammonia level 11/04/2018  . Abnormal CT of the abdomen 11/04/2018  . Atrial fibrillation with RVR ([place]) 11/04/2018  . Sepsis ([place]) 10/21/2018  . Acute encephalopathy 10/21/2018  . Elevated alkaline phosphatase level 10/21/2018  . Chronic anemia 10/21/2018  .  Thrombocytopenia ([employer]) 10/21/2018  . Pulmonary HTN ([place]) 03/04/2017  . Aortic valve stenosis 03/04/2017  . Hypertension, accelerated with heart disease, without CHF 01/12/2017  . Dyspnea on exertion 01/12/2017  . ESRD (end stage renal disease) ([place]) 06/07/2012  . Hyperlipidemia 04/24/2011  . HYPERTENSION, BENIGN 10/24/2009  . Type 2 diabetes mellitus with hypertension and end stage renal disease on dialysis ([place]) 10/23/2009  . OBSTRUCTIVE SLEEP APNEA 10/23/2009  . PAF (paroxysmal atrial fibrillation) ([place]) 10/23/2009  . CHF (congestive heart failure) ([place]) 10/23/2009    CMP     Component Value Date/Time   NA 136 05/31/2019 1319   NA 141 02/27/2019   K 3.6 05/31/2019 1319   CL 98 05/31/2019 1319  CO2 24 05/31/2019 1319   GLUCOSE 140 (H) 05/31/2019 1319   BUN 44 (H) 05/31/2019 1319   BUN 64 (A) 02/27/2019   CREATININE 4.29 (H) 05/31/2019 1319   CALCIUM 9.1 05/31/2019 1319   CALCIUM 8.3 (L) 10/25/2008 1450   PROT 6.5 05/28/2019 0253   ALBUMIN 2.3 (L) 05/31/2019 1319   AST 31 05/28/2019 0253   ALT 42 05/28/2019 0253   ALKPHOS 261 (H) 05/28/2019 0253   BILITOT 0.6 05/28/2019 0253   GFRNONAA 12 (L) 05/31/2019 1319   GFRAA 14 (L) 05/31/2019 1319   Recent Labs    02/20/19 1300  05/28/19 0253 05/29/19 0541 05/29/19 2316 05/30/19 0727 05/31/19 1319  NA 137   < > 137  --   --  137 136  K 3.0*   < > 3.7  --   --  5.1 3.6  CL 101   < > 102  --   --  101 98  CO2 23   < > 21*  --   --  17* 24  GLUCOSE 101*   < > 151*  --   --  99 140*  BUN 27*   < > 80*  --   --  158* 44*  CREATININE 3.41*   < > 6.12*  --   --  8.32* 4.29*  CALCIUM 7.7*   < > 9.3  --   --  10.0 9.1  MG  --    < > 2.0 2.1 2.4  --   --   PHOS 2.4*  --   --   --   --  2.6 3.0   < > = values in this interval not displayed.   Recent Labs    05/26/19 0214 05/27/19 0348 05/28/19 0253 05/30/19 0727 05/31/19 1319  AST 49* 55* 31  --   --   ALT 44 55* 42  --   --   ALKPHOS 235* 272* 261*  --   --   BILITOT  0.7 0.8 0.6  --   --   PROT 6.8 7.0 6.5  --   --   ALBUMIN 2.4* 2.4* 2.2* 2.3* 2.3*   Recent Labs    12/24/18 0227  02/15/19 1242  05/25/19 1421  05/29/19 2316 05/30/19 0727 05/31/19 1319  WBC 15.6*   < > 14.7*   < > 17.5*   < > 15.8* 14.6* 10.3  NEUTROABS 13.0*  --  11.8*  --  14.2*  --   --   --   --   HGB 10.5*   < > 10.1*   < > 10.7*   < > 6.8* 7.4* 7.6*  HCT 33.5*   < > 35.1*   < > 35.1*   < > 21.8* 23.1* 24.7*  MCV 99.4   < > 90.5   < > 89.1   < > 86.2 85.9 88.5  PLT 204   < > 227   < > 251   < > 206 192 220   < > = values in this interval not displayed.   Recent Labs    11/11/18 1517  CHOL 113  LDLCALC 62  TRIG 60   No results found for: [facility] Lab Results  Component Value Date   TSH 1.839 05/25/2019   Lab Results  Component Value Date   HGBA1C 4.9 02/16/2019   Lab Results  Component Value Date   CHOL 113 11/11/2018   HDL 39 (L) 11/11/2018   LDLCALC 62 11/11/2018  TRIG 60 11/11/2018   CHOLHDL 2.9 11/11/2018    Significant Diagnostic Results in last 30 days:  No results found.  Assessment and Plan  Peripheral vascular disease due to secondary diabetes [facility]) Patient status post bilateral AKA secondary to;: Patient continues on Coumadin with therapeutic INR  OSA (obstructive sleep apnea) No excessive daytime sleepiness noted, patient stable; continue CPAP nightly  GERD (gastroesophageal reflux disease) No complaints or problems; continue Protonix 40 mg daily     Edward Duos, MD

## 2019-07-06 ENCOUNTER — Non-Acute Institutional Stay (SKILLED_NURSING_FACILITY): Payer: Medicare Other | Admitting: Internal Medicine

## 2019-07-06 DIAGNOSIS — Z5181 Encounter for therapeutic drug level monitoring: Secondary | ICD-10-CM | POA: Diagnosis not present

## 2019-07-06 DIAGNOSIS — Z7901 Long term (current) use of anticoagulants: Secondary | ICD-10-CM

## 2019-07-06 DIAGNOSIS — I480 Paroxysmal atrial fibrillation: Secondary | ICD-10-CM | POA: Diagnosis not present

## 2019-07-08 ENCOUNTER — Encounter: Payer: Self-pay | Admitting: Internal Medicine

## 2019-07-08 NOTE — Assessment & Plan Note (Signed)
No excessive daytime sleepiness noted, patient stable; continue CPAP nightly

## 2019-07-08 NOTE — Assessment & Plan Note (Signed)
No complaints or problems; continue Protonix 40 mg daily

## 2019-07-08 NOTE — Progress Notes (Signed)
Location:      Place of Service:     Edward Duos, MD  Patient Care Team: Edward Duos, MD as PCP - General (Internal Medicine) [name], MD as Consulting Physician (Nephrology) Rehab, [name], [facility]  Extended Emergency Contact Information Primary Emergency Contact: [name],[name] Address: [place], [place] [address] [name] of [place] Phone: [phone] Work Phone: [phone] Mobile Phone: [phone] Relation: Daughter Secondary Emergency Contact: [name] Mobile Phone: [phone] Relation: Other    Allergies: Occlusive silicone sheets [silicone], Other, and Tape  Chief Complaint  Patient presents with  . Acute Visit    HPI: Patient is 78 y.o. male who is being seen for INR check.  Patient's INR today is 2.4.  Patient has no complaints.  Patient is on anticoagulation secondary to atrial fibrillation.  Past Medical History:  Diagnosis Date  . A-fib ([place])   . Anemia   . Blood transfusion   . BPH (benign prostatic hyperplasia)   . CHF (congestive heart failure) ([place])   . Diarrhea   . DM (diabetes mellitus) ([place])   . ESRD on hemodialysis ([place])    Started dialysis in 2009  . History of GI bleed    secondary to coumadin  . HTN (hypertension)   . Hyperlipidemia   . OSA (obstructive sleep apnea)    uses CPAP  . Secondary hyperparathyroidism of renal origin [facility])     Past Surgical History:  Procedure Laterality Date  . ABDOMINAL AORTOGRAM N/A 11/15/2018   Procedure: ABDOMINAL AORTOGRAM;  Surgeon: [name], MD;  Location: [place] [facility];  Service: Cardiovascular;  Laterality: N/A;  . AMPUTATION Left 12/06/2018   Procedure: AMPUTATION DIGIT LEFT FIFTH TOE;  Surgeon: Angelia Mould, MD;  Location: [place];  Service: Vascular;  Laterality: Left;  . AMPUTATION Bilateral 12/29/2018   Procedure: AMPUTATION ABOVE KNEE;  Surgeon: Waynetta Sandy, MD;  Location: [place];  Service: Vascular;  Laterality: Bilateral;  . APPLICATION OF WOUND VAC Right 02/17/2019   Procedure: Application Of Wound Vac;  Surgeon: Marty Heck, MD;  Location: [place];  Service: Vascular;  Laterality: Right;  . BVT  5/46/27   Left  Basilic Vein Transposition  . CHOLECYSTECTOMY    . EYE SURGERY     Catarct bil  . I&D EXTREMITY Right 02/17/2019   Procedure: Right above the kneee debridement;  Surgeon: Marty Heck, MD;  Location: [place];  Service: Vascular;  Laterality: Right;  . INSERTION OF DIALYSIS CATHETER  05/28/2012   Procedure: INSERTION OF DIALYSIS CATHETER;  Surgeon: Mal Misty, MD;  Location: [place];  Service: Vascular;  Laterality: Right;  . Left arm shuntogram.    . Left forearm loop graft with 6 mm Gore-Tex graft.    . LOWER EXTREMITY ANGIOGRAPHY Bilateral 11/15/2018   Procedure: LOWER EXTREMITY ANGIOGRAPHY;  Surgeon: [name], MD;  Location: [place] [facility];  Service: Cardiovascular;  Laterality: Bilateral;  . Pars plana vitrectomy with 25-gauge system    . PERIPHERAL VASCULAR ATHERECTOMY Left 11/15/2018   Procedure: PERIPHERAL VASCULAR ATHERECTOMY;  Surgeon: [name], MD;  Location: [place] [facility];  Service: Cardiovascular;  Laterality: Left;  SFA with STENT  . PERIPHERAL VASCULAR BALLOON ANGIOPLASTY Left 11/15/2018   Procedure: PERIPHERAL VASCULAR BALLOON ANGIOPLASTY;  Surgeon: [name], MD;  Location: [place] [facility];  Service: Cardiovascular;  Laterality: Left;  PT    Allergies as of 07/06/2019      Reactions   Occlusive Silicone Sheets [silicone] Other (See Comments)   "Allergic," per [facility]   Other Other (See Comments)   Unknown reaction to Occlusive adhesive- "Allergic," per [facility]   Tape Itching, Other (See Comments)   Use Cloth tape only, please      Medication List       Accurate as of July 06, 2019 11:59 PM. If you have any questions, ask your nurse or doctor.         acetaminophen 500 MG tablet Commonly known as: TYLENOL Take 1,000 mg by mouth 3 (three) times daily.   ADULT NUTRITIONAL SUPPLEMENT PO Take by mouth. Magic cup with dinner   atorvastatin 20 MG tablet Commonly known as: LIPITOR Take 20 mg by mouth daily.   calcium acetate 667 MG capsule Commonly known as: PHOSLO Take 1,334 mg by mouth 3 (three) times daily with meals.   dicyclomine 20 MG tablet Commonly known as: BENTYL Take 20 mg by mouth 3 (three) times daily before meals.   doxercalciferol 4 MCG/2ML injection Commonly known as: HECTOROL Inject 1 mL (2 mcg total) into the vein every Monday, Wednesday, and Friday with hemodialysis.   feeding supplement (PRO-STAT SUGAR FREE 64) Liqd Take 30 mLs by mouth 2 (two) times daily. 9a and 5p   loperamide 2 MG tablet Commonly known as: IMODIUM A-D Take 4 mg by mouth See admin instructions. Take 2 tablets ( 4 mg) by mouth after 1st loose stool as needed for diarrhea   metoprolol tartrate 25 MG tablet Commonly known as: LOPRESSOR Take 25 mg by mouth See admin instructions. Take 25 mg by mouth two times a day on Sun/Tues/Thurs/Sat and 25 mg at bedtime on Mon/Wed/Fri   midodrine 10 MG tablet Commonly known as: PROAMATINE Take one tablet (10 mg) by mouth on Monday, Wednesday, Friday 30 minutes prior to dialysis.   pantoprazole 40 MG tablet Commonly known as: PROTONIX Take 1 tablet (40 mg total) by mouth daily.   RENAL VITAMIN PO Take 1 tablet by mouth at bedtime.   warfarin 2.5 MG tablet Commonly known as: COUMADIN Take 2.5 mg by mouth See admin instructions. Take everyday except Monday.   warfarin 5 MG tablet Commonly known as: COUMADIN Take 5 mg by mouth See admin instructions. Take Mondays and Wednesdays       No orders of the defined types were placed in this encounter.    There is no immunization history on file for this patient.  Social History   Tobacco Use  . Smoking status: Former Smoker    Types:  Cigarettes    Quit date: 06/30/2004    Years since quitting: 15.0  . Smokeless tobacco: Never Used  Substance Use Topics  . Alcohol use: No     Vitals:   07/08/19 1358  BP: 106/64  Pulse: 85  Resp: 18  Temp: (!) 96.9 F (36.1 C)   Body mass index is 19.05 kg/m.   Patient Active Problem List   Diagnosis Date Noted  . 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 Anemia due to end stage renal disease ([place]) 03/24/2019  . Protein-calorie malnutrition, severe ([place]) 03/24/2019  .  Hyperlipidemia associated with type 2 diabetes mellitus ([place]) 02/22/2019  . Amputation stump infection ([place]) 02/15/2019  . Conjunctivitis of left eye 01/04/2019  . 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[place]) 12/30/2018  . Open wound of left foot   . Encounter for monitoring Coumadin therapy   . OSA (obstructive sleep apnea)   . HTN (hypertension)   . ESRD on hemodialysis ([place])   . BPH (benign prostatic hyperplasia)   . Hypotension 12/07/2018  . GERD (gastroesophageal reflux disease) 12/07/2018  . Pressure injury of skin 12/03/2018  . Symptomatic anemia 12/02/2018  . Acute on chronic blood loss anemia 12/02/2018  . 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[place]) 12/02/2018  . Lower GI bleeding 12/02/2018  . Heme positive stool   . Acute on chronic diastolic (congestive) heart failure ([place]) 11/19/2018  . Acute CVA (cerebrovascular accident) ([place]) 11/19/2018  . Hypoglycemia 11/19/2018  . Peripheral vascular disease due to secondary diabetes ([place]) 11/19/2018  . Dry gangrene ([place]) 11/19/2018  . 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 DNR (do not resuscitate) discussion   . Goals of care, counseling/discussion   . Palliative  care by specialist   . Acute hypoxemic respiratory failure ([place]) 11/06/2018  . Volume overload 11/06/2018  . Multifocal pneumonia 11/06/2018  . Acute metabolic encephalopathy 29/52/8413  . Physical deconditioning 11/06/2018  . Acute respiratory failure with hypoxia and hypercapnia ([place]) 11/04/2018  . CAP (community acquired pneumonia) 11/04/2018  . Pleural effusion, right 11/04/2018  . Increased ammonia level 11/04/2018  . Abnormal CT of the abdomen 11/04/2018  . Atrial fibrillation with RVR ([place]) 11/04/2018  . Sepsis ([place]) 10/21/2018  . Acute encephalopathy 10/21/2018  . Elevated alkaline phosphatase level 10/21/2018  . Chronic anemia 10/21/2018  . Thrombocytopenia ([place]) 10/21/2018  . Pulmonary HTN ([place]) 03/04/2017  . Aortic valve stenosis 03/04/2017  . Hypertension, accelerated with heart disease, without CHF 01/12/2017  . Dyspnea on exertion 01/12/2017  . ESRD (end stage renal disease) ([place]) 06/07/2012  . Hyperlipidemia 04/24/2011  . HYPERTENSION, BENIGN 10/24/2009  . Type 2 diabetes mellitus with hypertension and end stage renal disease on dialysis ([place]) 10/23/2009  . OBSTRUCTIVE SLEEP APNEA 10/23/2009  . PAF (paroxysmal atrial fibrillation) ([place]) 10/23/2009  . CHF (congestive heart failure) ([place]) 10/23/2009    CMP     Component Value Date/Time   NA 136 05/31/2019 1319   NA 141 02/27/2019   K 3.6 05/31/2019 1319   CL 98 05/31/2019 1319   CO2 24 05/31/2019 1319   GLUCOSE 140 (H) 05/31/2019 1319   BUN 44 (H) 05/31/2019 1319   BUN 64 (A) 02/27/2019   CREATININE 4.29 (H) 05/31/2019 1319   CALCIUM 9.1 05/31/2019 1319   CALCIUM 8.3 (L) 10/25/2008 1450   PROT 6.5 05/28/2019 0253   ALBUMIN 2.3 (L) 05/31/2019 1319   AST 31 05/28/2019 0253   ALT 42 05/28/2019 0253   ALKPHOS 261 (H) 05/28/2019 0253   BILITOT 0.6 05/28/2019 0253   GFRNONAA 12 (L) 05/31/2019 1319   GFRAA 14 (L) 05/31/2019 1319   Recent Labs    02/20/19 1300  05/28/19 0253 05/29/19 0541 05/29/19 2316  05/30/19 0727 05/31/19 1319  NA 137   < > 137  --   --  137 136  K 3.0*   < > 3.7  --   --  5.1 3.6  CL 101   < > 102  --   --  101 98  CO2 23   < > 21*  --   --  17* 24  GLUCOSE 101*   < > 151*  --   --  99 140*  BUN 27*   < > 80*  --   --  158* 44*  CREATININE 3.41*   < > 6.12*  --   --  8.32* 4.29*  CALCIUM 7.7*   < > 9.3  --   --  10.0 9.1  MG  --    < > 2.0 2.1 2.4  --   --   PHOS 2.4*  --   --   --   --  2.6 3.0   < > = values in this interval not displayed.   Recent Labs    05/26/19 0214 05/27/19 0348 05/28/19 0253 05/30/19 0727 05/31/19 1319  AST 49* 55* 31  --   --   ALT 44 55* 42  --   --   ALKPHOS 235* 272* 261*  --   --   BILITOT 0.7 0.8 0.6  --   --   PROT 6.8 7.0 6.5  --   --   ALBUMIN 2.4* 2.4* 2.2* 2.3* 2.3*   Recent Labs    12/24/18 0227  02/15/19 1242  05/25/19 1421  05/29/19 2316 05/30/19 0727 05/31/19 1319  WBC 15.6*   < > 14.7*   < > 17.5*   < > 15.8* 14.6* 10.3  NEUTROABS 13.0*  --  11.8*  --  14.2*  --   --   --   --   HGB 10.5*   < > 10.1*   < > 10.7*   < > 6.8* 7.4* 7.6*  HCT 33.5*   < > 35.1*   < > 35.1*   < > 21.8* 23.1* 24.7*  MCV 99.4   < > 90.5   < > 89.1   < > 86.2 85.9 88.5  PLT 204   < > 227   < > 251   < > 206 192 220   < > = values in this interval not displayed.   Recent Labs    11/11/18 1517  CHOL 113  LDLCALC 62  TRIG 60   No results found for: [facility] Lab Results  Component Value Date   TSH 1.839 05/25/2019   Lab Results  Component Value Date   HGBA1C 4.9 02/16/2019   Lab Results  Component Value Date   CHOL 113 11/11/2018   HDL 39 (L) 11/11/2018   LDLCALC 62 11/11/2018   TRIG 60 11/11/2018   CHOLHDL 2.9 11/11/2018    Significant Diagnostic Results in last 30 days:  No results found.  Assessment and Plan  Paroxysmal atrial fibrillation/chronic Coumadin therapy- today patient's INR 2.4 on 5 mg Monday and Wednesday and 2.5 mg of Coumadin every other day; will continue current regimen and repeat in 1  week     [name] , MD

## 2019-07-08 NOTE — Assessment & Plan Note (Addendum)
Patient status post bilateral AKA secondary to;: Patient continues on Coumadin with therapeutic INR

## 2019-07-14 ENCOUNTER — Non-Acute Institutional Stay (SKILLED_NURSING_FACILITY): Payer: Medicare Other | Admitting: Internal Medicine

## 2019-07-14 DIAGNOSIS — I480 Paroxysmal atrial fibrillation: Secondary | ICD-10-CM | POA: Diagnosis not present

## 2019-07-14 DIAGNOSIS — Z5181 Encounter for therapeutic drug level monitoring: Secondary | ICD-10-CM | POA: Diagnosis not present

## 2019-07-14 DIAGNOSIS — Z7901 Long term (current) use of anticoagulants: Secondary | ICD-10-CM | POA: Diagnosis not present

## 2019-07-17 ENCOUNTER — Encounter: Payer: Self-pay | Admitting: Internal Medicine

## 2019-07-17 DIAGNOSIS — Z0000 Encounter for general adult medical examination without abnormal findings: Secondary | ICD-10-CM

## 2019-07-17 NOTE — Progress Notes (Deleted)
Subjective:   [name] is a 78 y.o. male who presents for Medicare Annual/Subsequent preventive examination.  Review of Systems:  ***       Objective:    Vitals: BP 103/62   Pulse 80   Temp (!) 97.4 F (36.3 C) (Oral)   Resp 19   Ht 3' (0.914 m)   Wt 117 lb 6.4 oz (53.3 kg)   BMI 63.69 kg/m   Body mass index is 63.69 kg/m.  Advanced Directives 07/04/2019 06/29/2019 06/09/2019 06/01/2019 05/25/2019 05/25/2019 05/17/2019  Does Patient Have a Medical Advance Directive? Yes Yes Yes Yes No Yes Yes  Type of Advance Directive Out of facility DNR (pink MOST or yellow form) Out of facility DNR (pink MOST or yellow form) Out of facility DNR (pink MOST or yellow form) Out of facility DNR (pink MOST or yellow form) - Out of facility DNR (pink MOST or yellow form) Out of facility DNR (pink MOST or yellow form)  Does patient want to make changes to medical advance directive? No - Patient declined No - Patient declined No - Patient declined No - Patient declined - No - Patient declined No - Patient declined  Copy of [place] in Chart? - - - - - - -  Would patient like information on creating a medical advance directive? - No - Patient declined No - Patient declined No - Patient declined No - Patient declined - No - Patient declined  Pre-existing out of facility DNR order (yellow form or pink MOST form) Yellow form placed in chart (order not valid for inpatient use) Yellow form placed in chart (order not valid for inpatient use);Pink MOST form placed in chart (order not valid for inpatient use) Yellow form placed in chart (order not valid for inpatient use) - - - Yellow form placed in chart (order not valid for inpatient use);Pink MOST form placed in chart (order not valid for inpatient use)    Tobacco Social History   Tobacco Use  Smoking Status Former Smoker  . Types: Cigarettes  . Quit date: 06/30/2004  . Years since quitting: 15.0  Smokeless Tobacco Never Used      Counseling given: Not Answered   Clinical Intake:                       Past Medical History:  Diagnosis Date  . A-fib ([place])   . Anemia   . Blood transfusion   . BPH (benign prostatic hyperplasia)   . CHF (congestive heart failure) ([place])   . Diarrhea   . DM (diabetes mellitus) ([place])   . ESRD on hemodialysis ([place])    Started dialysis in 2009  . History of GI bleed    secondary to coumadin  . HTN (hypertension)   . Hyperlipidemia   . OSA (obstructive sleep apnea)    uses CPAP  . Secondary hyperparathyroidism of renal origin [facility])    Past Surgical History:  Procedure Laterality Date  . ABDOMINAL AORTOGRAM N/A 11/15/2018   Procedure: ABDOMINAL AORTOGRAM;  Surgeon: [name], MD;  Location: [place] [facility];  Service: Cardiovascular;  Laterality: N/A;  . AMPUTATION Left 12/06/2018   Procedure: AMPUTATION DIGIT LEFT FIFTH TOE;  Surgeon: Angelia Mould, MD;  Location: [place];  Service: Vascular;  Laterality: Left;  . AMPUTATION Bilateral 12/29/2018   Procedure: AMPUTATION ABOVE KNEE;  Surgeon: Waynetta Sandy, MD;  Location: [place];  Service: Vascular;  Laterality: Bilateral;  .  APPLICATION OF WOUND VAC Right 02/17/2019   Procedure: Application Of Wound Vac;  Surgeon: Marty Heck, MD;  Location: [place];  Service: Vascular;  Laterality: Right;  . BVT  2/44/01   Left  Basilic Vein Transposition  . CHOLECYSTECTOMY    . EYE SURGERY     Catarct bil  . I&D EXTREMITY Right 02/17/2019   Procedure: Right above the kneee debridement;  Surgeon: Marty Heck, MD;  Location: [place];  Service: Vascular;  Laterality: Right;  . INSERTION OF DIALYSIS CATHETER  05/28/2012   Procedure: INSERTION OF DIALYSIS CATHETER;  Surgeon: Mal Misty, MD;  Location: [place];  Service: Vascular;  Laterality: Right;  . Left arm shuntogram.    . Left forearm loop graft with 6 mm Gore-Tex graft.    . LOWER EXTREMITY ANGIOGRAPHY Bilateral 11/15/2018   Procedure:  LOWER EXTREMITY ANGIOGRAPHY;  Surgeon: [name], MD;  Location: [place] [facility];  Service: Cardiovascular;  Laterality: Bilateral;  . Pars plana vitrectomy with 25-gauge system    . PERIPHERAL VASCULAR ATHERECTOMY Left 11/15/2018   Procedure: PERIPHERAL VASCULAR ATHERECTOMY;  Surgeon: [name], MD;  Location: [place] [facility];  Service: Cardiovascular;  Laterality: Left;  SFA with STENT  . PERIPHERAL VASCULAR BALLOON ANGIOPLASTY Left 11/15/2018   Procedure: PERIPHERAL VASCULAR BALLOON ANGIOPLASTY;  Surgeon: [name], MD;  Location: [place] [facility];  Service: Cardiovascular;  Laterality: Left;  PT   Family History  Problem Relation Age of Onset  . Alzheimer's disease Mother   . Diabetes Father        Amputation:  bilateral legs  . Cancer Daughter        breast cancer  . Diabetes Son   . Heart disease Son        before age 83  . Hypertension Son   . Anesthesia problems Neg Hx    Social History   Socioeconomic History  . Marital status: Divorced    Spouse name: Not on file  . Number of children: Not on file  . Years of education: Not on file  . Highest education level: Not on file  Occupational History  . Not on file  Social Needs  . Financial resource strain: Not on file  . Food insecurity    Worry: Not on file    Inability: Not on file  . Transportation needs    Medical: Not on file    Non-medical: Not on file  Tobacco Use  . Smoking status: Former Smoker    Types: Cigarettes    Quit date: 06/30/2004    Years since quitting: 15.0  . Smokeless tobacco: Never Used  Substance and Sexual Activity  . Alcohol use: No  . Drug use: No  . Sexual activity: Never  Lifestyle  . Physical activity    Days per week: Not on file    Minutes per session: Not on file  . Stress: Not on file  Relationships  . Social Herbalist on phone: Not on file    Gets together: Not on file    Attends religious service: Not on file    Active member of  club or organization: Not on file    Attends meetings of clubs or organizations: Not on file    Relationship status: Not on file  Other Topics Concern  . Not on file  Social History Narrative     The patient is a former smoker, quit is 2005.  Does not     drink or abuse drugs.           Outpatient Encounter Medications as of 07/17/2019  Medication Sig  . acetaminophen (TYLENOL) 500 MG tablet Take 1,000 mg by mouth 3 (three) times daily.   . Amino Acids-Protein Hydrolys (FEEDING SUPPLEMENT, PRO-STAT SUGAR FREE 64,) LIQD Take 30 mLs by mouth 2 (two) times daily. 9a and 5p  . atorvastatin (LIPITOR) 20 MG tablet Take 20 mg by mouth daily.  . B Complex-C-Folic Acid (RENAL VITAMIN PO) Take 1 tablet by mouth at bedtime.   . calcium acetate (PHOSLO) 667 MG capsule Take 1,334 mg by mouth 3 (three) times daily with meals.  . dicyclomine (BENTYL) 20 MG tablet Take 20 mg by mouth 3 (three) times daily before meals.  Marland Kitchen doxercalciferol (HECTOROL) 4 MCG/2ML injection Inject 1 mL (2 mcg total) into the vein every Monday, Wednesday, and Friday with hemodialysis.  Marland Kitchen loperamide (IMODIUM A-D) 2 MG tablet Take 4 mg by mouth See admin instructions. Take 2 tablets ( 4 mg) by mouth after 1st loose stool as needed for diarrhea  . metoprolol tartrate (LOPRESSOR) 25 MG tablet Take 25 mg by mouth See admin instructions. Take 25 mg by mouth two times a day on Sun/Tues/Thurs/Sat and 25 mg at bedtime on Mon/Wed/Fri  . midodrine (PROAMATINE) 10 MG tablet Take one tablet (10 mg) by mouth on Monday, Wednesday, Friday 30 minutes prior to dialysis.  . Nutritional Supplements (ADULT NUTRITIONAL SUPPLEMENT PO) Take by mouth. Magic cup with dinner  . pantoprazole (PROTONIX) 40 MG tablet Take 1 tablet (40 mg total) by mouth daily.  Marland Kitchen warfarin (COUMADIN) 2.5 MG tablet Take 2.5 mg by mouth See admin instructions. Take everyday except Mondays and Wednesdays  . warfarin (COUMADIN) 5 MG tablet Take 5 mg by mouth See admin instructions.  Take Mondays and Wednesdays   No facility-administered encounter medications on file as of 07/17/2019.     Activities of Daily Living In your present state of health, do you have any difficulty performing the following activities: 02/15/2019 12/28/2018  Hearing? N N  Vision? N Y  Difficulty concentrating or making decisions? N Y  Walking or climbing stairs? Y Y  Dressing or bathing? Y Y  Doing errands, shopping? [place]  Some recent data might be hidden    Patient Care Team: Hennie Duos, MD as PCP - General (Internal Medicine) [name], MD as Consulting Physician (Nephrology) Rehab, [place] ([place]   Assessment:   This is a routine wellness examination for [place].  Exercise Activities and Dietary recommendations    Goals   None     Fall Risk No flowsheet data found. Is the patient's home free of loose throw rugs in walkways, pet beds, electrical cords, etc?   {Blank single:19197::"yes","no"}      Grab bars in the bathroom? {Blank single:19197::"yes","no"}      Handrails on the stairs?   {Blank single:19197::"yes","no"}      Adequate lighting?   {Blank single:19197::"yes","no"}  Timed Get Up and Go Performed: ***  Depression Screen No flowsheet data found.  Cognitive Function         There is no immunization history on file for this patient.  Qualifies for Shingles Vaccine? ***  Screening Tests Health Maintenance  Topic Date Due  . PNA vac Low Risk Adult (1 of 2 - PCV13) 11/01/2005  . FOOT EXAM  12/29/2019 (Originally 11/01/1951)  . OPHTHALMOLOGY EXAM  12/29/2019 (Originally 11/01/1951)  . TETANUS/TDAP  07/18/2020 (Originally 11/02/1960)  . INFLUENZA VACCINE  07/29/2019  . HEMOGLOBIN A1C  08/17/2019  . URINE MICROALBUMIN  Discontinued   Cancer Screenings: Lung: Low Dose CT Chest recommended if Age 61-80 years, 30 pack-year currently smoking OR have quit w/in 15years. Patient {DOES NOT  does:27190::"does not"} qualify. Colorectal: ***  Additional Screenings: *** Hepatitis C Screening:      Plan:   ***  I have personally reviewed and noted the following in the patient's chart:   . Medical and social history . Use of alcohol, tobacco or illicit drugs  . Current medications and supplements . Functional ability and status . Nutritional status . Physical activity . Advanced directives . List of other physicians . Hospitalizations, surgeries, and ER visits in previous 12 months . Vitals . Screenings to include cognitive, depression, and falls . Referrals and appointments  In addition, I have reviewed and discussed with patient certain preventive protocols, quality metrics, and best practice recommendations. A written personalized care plan for preventive services as well as general preventive health recommendations were provided to patient.     [name], [place]  07/17/2019

## 2019-07-18 ENCOUNTER — Encounter: Payer: Self-pay | Admitting: Internal Medicine

## 2019-07-18 NOTE — Progress Notes (Signed)
Location:  [place] of Service:     Hennie Duos, MD  Patient Care Team: Hennie Duos, MD as PCP - General (Internal Medicine) [name], MD as Consulting Physician (Nephrology) Rehab, [place] ([place], [facility]  Extended Emergency Contact Information Primary Emergency Contact: [name],[name] Address: [place], [place] [address] [name] of [place] Phone: [phone] Work Phone: [phone] Mobile Phone: [phone] Relation: Daughter Secondary Emergency Contact: [name] Mobile Phone: [phone] Relation: Other    Allergies: Occlusive silicone sheets [silicone], Other, and Tape  Chief Complaint  Patient presents with  . Acute Visit    HPI: Patient is 78 y.o. male who is being seen today for INR check.  Patient is on Coumadin for paroxysmal atrial fibrillation.  INR today is 2.4.  Patient has no complaints.  Past Medical History:  Diagnosis Date  . A-fib ([place])   . Anemia   . Blood transfusion   . BPH (benign prostatic hyperplasia)   . CHF (congestive heart failure) ([place])   . Diarrhea   . DM (diabetes mellitus) ([place])   . ESRD on hemodialysis ([place])    Started dialysis in 2009  . History of GI bleed    secondary to coumadin  . HTN (hypertension)   . Hyperlipidemia   . OSA (obstructive sleep apnea)    uses CPAP  . Secondary hyperparathyroidism of renal origin [facility])     Past Surgical History:  Procedure Laterality Date  . ABDOMINAL AORTOGRAM N/A 11/15/2018   Procedure: ABDOMINAL AORTOGRAM;  Surgeon: [name], MD;  Location: [place] [facility];  Service: Cardiovascular;  Laterality: N/A;  . AMPUTATION Left 12/06/2018   Procedure: AMPUTATION DIGIT LEFT FIFTH TOE;  Surgeon: Angelia Mould, MD;  Location: [place];  Service: Vascular;  Laterality: Left;  . AMPUTATION Bilateral 12/29/2018   Procedure: AMPUTATION ABOVE KNEE;   Surgeon: Waynetta Sandy, MD;  Location: [place];  Service: Vascular;  Laterality: Bilateral;  . APPLICATION OF WOUND VAC Right 02/17/2019   Procedure: Application Of Wound Vac;  Surgeon: Marty Heck, MD;  Location: [place];  Service: Vascular;  Laterality: Right;  . BVT  4/66/59   Left  Basilic Vein Transposition  . CHOLECYSTECTOMY    . EYE SURGERY     Catarct bil  . I&D EXTREMITY Right 02/17/2019   Procedure: Right above the kneee debridement;  Surgeon: Marty Heck, MD;  Location: [place];  Service: Vascular;  Laterality: Right;  . INSERTION OF DIALYSIS CATHETER  05/28/2012   Procedure: INSERTION OF DIALYSIS CATHETER;  Surgeon: Mal Misty, MD;  Location: [place];  Service: Vascular;  Laterality: Right;  . Left arm shuntogram.    . Left forearm loop graft with 6 mm Gore-Tex graft.    . LOWER EXTREMITY ANGIOGRAPHY Bilateral 11/15/2018   Procedure: LOWER EXTREMITY ANGIOGRAPHY;  Surgeon: [name], MD;  Location: [place] [facility];  Service: Cardiovascular;  Laterality: Bilateral;  . Pars plana vitrectomy with 25-gauge system    . PERIPHERAL VASCULAR ATHERECTOMY Left 11/15/2018   Procedure: PERIPHERAL VASCULAR ATHERECTOMY;  Surgeon: [name], MD;  Location: [place] [facility];  Service: Cardiovascular;  Laterality: Left;  SFA with STENT  . PERIPHERAL VASCULAR BALLOON ANGIOPLASTY Left 11/15/2018   Procedure: PERIPHERAL VASCULAR BALLOON ANGIOPLASTY;  Surgeon: [name], MD;  Location: [place] [facility];  Service: Cardiovascular;  Laterality: Left;  PT    Allergies as of 07/14/2019      Reactions   Occlusive Silicone Sheets [silicone] Other (See Comments)   "Allergic," per [facility]   Other Other (See Comments)   Unknown reaction to Occlusive adhesive- "Allergic," per [facility]   Tape Itching, Other (See Comments)   Use Cloth tape only, please      Medication List       Accurate as of July 14, 2019 11:59 PM. If you have any questions, ask your nurse or  doctor.        acetaminophen 500 MG tablet Commonly known as: TYLENOL Take 1,000 mg by mouth 3 (three) times daily.   ADULT NUTRITIONAL SUPPLEMENT PO Take by mouth. Magic cup with dinner   atorvastatin 20 MG tablet Commonly known as: LIPITOR Take 20 mg by mouth daily.   calcium acetate 667 MG capsule Commonly known as: PHOSLO Take 1,334 mg by mouth 3 (three) times daily with meals.   dicyclomine 20 MG tablet Commonly known as: BENTYL Take 20 mg by mouth 3 (three) times daily before meals.   doxercalciferol 4 MCG/2ML injection Commonly known as: HECTOROL Inject 1 mL (2 mcg total) into the vein every Monday, Wednesday, and Friday with hemodialysis.   feeding supplement (PRO-STAT SUGAR FREE 64) Liqd Take 30 mLs by mouth 2 (two) times daily. 9a and 5p   loperamide 2 MG tablet Commonly known as: IMODIUM A-D Take 4 mg by mouth See admin instructions. Take 2 tablets ( 4 mg) by mouth after 1st loose stool as needed for diarrhea   metoprolol tartrate 25 MG tablet Commonly known as: LOPRESSOR Take 25 mg by mouth See admin instructions. Take 25 mg by mouth two times a day on Sun/Tues/Thurs/Sat and 25 mg at bedtime on Mon/Wed/Fri   midodrine 10 MG tablet Commonly known as: PROAMATINE Take one tablet (10 mg) by mouth on Monday, Wednesday, Friday 30 minutes prior to dialysis.   pantoprazole 40 MG tablet Commonly known as: PROTONIX Take 1 tablet (40 mg total) by mouth daily.   RENAL VITAMIN PO Take 1 tablet by mouth at bedtime.   warfarin 2.5 MG tablet Commonly known as: COUMADIN Take 2.5 mg by mouth See admin instructions. Take everyday except Mondays and Wednesdays   warfarin 5 MG tablet Commonly known as: COUMADIN Take 5 mg by mouth See admin instructions. Take Mondays and Wednesdays       No orders of the defined types were placed in this encounter.    There is no immunization history on file for this patient.  Social History   Tobacco Use  . Smoking status:  Former Smoker    Types: Cigarettes    Quit date: 06/30/2004    Years since quitting: 15.0  . Smokeless tobacco: Never Used  Substance Use Topics  . Alcohol use: No     Vitals:   07/18/19 1541  BP: 135/67  Pulse: 69  Resp: 20  Temp: (!) 96.1 F (35.6 C)   Body mass index is 63.69 kg/m.   Patient Active Problem List   Diagnosis Date Noted  . 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 Anemia due to end stage renal disease ([place]) 03/24/2019  . Protein-calorie malnutrition,  severe ([place]) 03/24/2019  . Hyperlipidemia associated with type 2 diabetes mellitus ([place]) 02/22/2019  . Amputation stump infection ([place]) 02/15/2019  . Conjunctivitis of left eye 01/04/2019  . 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[place]) 12/30/2018  . Open wound of left foot   . Encounter for monitoring Coumadin therapy   . OSA (obstructive sleep apnea)   . HTN (hypertension)   . ESRD on hemodialysis ([place])   . BPH (benign prostatic hyperplasia)   . Hypotension 12/07/2018  . GERD (gastroesophageal reflux disease) 12/07/2018  . Pressure injury of skin 12/03/2018  . Symptomatic anemia 12/02/2018  . Acute on chronic blood loss anemia 12/02/2018  . 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 Lower GI bleeding 12/02/2018  . Heme positive stool   . Acute on chronic diastolic (congestive) heart failure ([place]) 11/19/2018  . Acute CVA (cerebrovascular accident) ([place]) 11/19/2018  . Hypoglycemia 11/19/2018  . Peripheral vascular disease due to secondary diabetes ([place]) 11/19/2018  . Dry gangrene ([place]) 11/19/2018  . 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 DNR (do not resuscitate) discussion   . Goals of care,  counseling/discussion   . Palliative care by specialist   . Acute hypoxemic respiratory failure ([place]) 11/06/2018  . Volume overload 11/06/2018  . Multifocal pneumonia 11/06/2018  . Acute metabolic encephalopathy 56/21/3086  . Physical deconditioning 11/06/2018  . Acute respiratory failure with hypoxia and hypercapnia ([place]) 11/04/2018  . CAP (community acquired pneumonia) 11/04/2018  . Pleural effusion, right 11/04/2018  . Increased ammonia level 11/04/2018  . Abnormal CT of the abdomen 11/04/2018  . Atrial fibrillation with RVR ([place]) 11/04/2018  . Sepsis ([place]) 10/21/2018  . Acute encephalopathy 10/21/2018  . Elevated alkaline phosphatase level 10/21/2018  . Chronic anemia 10/21/2018  . Thrombocytopenia ([place]) 10/21/2018  . Pulmonary HTN ([place]) 03/04/2017  . Aortic valve stenosis 03/04/2017  . Hypertension, accelerated with heart disease, without CHF 01/12/2017  . Dyspnea on exertion 01/12/2017  . ESRD (end stage renal disease) ([place]) 06/07/2012  . Hyperlipidemia 04/24/2011  . HYPERTENSION, BENIGN 10/24/2009  . Type 2 diabetes mellitus with hypertension and end stage renal disease on dialysis ([place]) 10/23/2009  . OBSTRUCTIVE SLEEP APNEA 10/23/2009  . PAF (paroxysmal atrial fibrillation) ([place]) 10/23/2009  . CHF (congestive heart failure) ([place]) 10/23/2009    CMP     Component Value Date/Time   NA 136 05/31/2019 1319   NA 141 02/27/2019   K 3.6 05/31/2019 1319   CL 98 05/31/2019 1319   CO2 24 05/31/2019 1319   GLUCOSE 140 (H) 05/31/2019 1319   BUN 44 (H) 05/31/2019 1319   BUN 64 (A) 02/27/2019   CREATININE 4.29 (H) 05/31/2019 1319   CALCIUM 9.1 05/31/2019 1319   CALCIUM 8.3 (L) 10/25/2008 1450   PROT 6.5 05/28/2019 0253   ALBUMIN 2.3 (L) 05/31/2019 1319   AST 31 05/28/2019 0253   ALT 42 05/28/2019 0253   ALKPHOS 261 (H) 05/28/2019 0253   BILITOT 0.6 05/28/2019 0253   GFRNONAA 12 (L) 05/31/2019 1319   GFRAA 14 (L) 05/31/2019 1319   Recent Labs    02/20/19 1300  05/28/19  0253 05/29/19 0541 05/29/19 2316 05/30/19 0727 05/31/19 1319  NA 137   < > 137  --   --  137 136  K 3.0*   < > 3.7  --   --  5.1 3.6  CL 101   < > 102  --   --  101 98  CO2 23   < >  21*  --   --  17* 24  GLUCOSE 101*   < > 151*  --   --  99 140*  BUN 27*   < > 80*  --   --  158* 44*  CREATININE 3.41*   < > 6.12*  --   --  8.32* 4.29*  CALCIUM 7.7*   < > 9.3  --   --  10.0 9.1  MG  --    < > 2.0 2.1 2.4  --   --   PHOS 2.4*  --   --   --   --  2.6 3.0   < > = values in this interval not displayed.   Recent Labs    05/26/19 0214 05/27/19 0348 05/28/19 0253 05/30/19 0727 05/31/19 1319  AST 49* 55* 31  --   --   ALT 44 55* 42  --   --   ALKPHOS 235* 272* 261*  --   --   BILITOT 0.7 0.8 0.6  --   --   PROT 6.8 7.0 6.5  --   --   ALBUMIN 2.4* 2.4* 2.2* 2.3* 2.3*   Recent Labs    12/24/18 0227  02/15/19 1242  05/25/19 1421  05/29/19 2316 05/30/19 0727 05/31/19 1319  WBC 15.6*   < > 14.7*   < > 17.5*   < > 15.8* 14.6* 10.3  NEUTROABS 13.0*  --  11.8*  --  14.2*  --   --   --   --   HGB 10.5*   < > 10.1*   < > 10.7*   < > 6.8* 7.4* 7.6*  HCT 33.5*   < > 35.1*   < > 35.1*   < > 21.8* 23.1* 24.7*  MCV 99.4   < > 90.5   < > 89.1   < > 86.2 85.9 88.5  PLT 204   < > 227   < > 251   < > 206 192 220   < > = values in this interval not displayed.   Recent Labs    11/11/18 1517  CHOL 113  LDLCALC 62  TRIG 60   No results found for: [facility] Lab Results  Component Value Date   TSH 1.839 05/25/2019   Lab Results  Component Value Date   HGBA1C 4.9 02/16/2019   Lab Results  Component Value Date   CHOL 113 11/11/2018   HDL 39 (L) 11/11/2018   LDLCALC 62 11/11/2018   TRIG 60 11/11/2018   CHOLHDL 2.9 11/11/2018    Significant Diagnostic Results in last 30 days:  No results found.  Assessment and Plan  Paroxysmal atrial fibrillation/encounter for monitoring Coumadin therapy-patient's INR today is 2.4.  This is exactly where we would want it to be.  Continue  Coumadin 5 mg Monday Wednesday and Coumadin 2.5 mg every other day of the week.  Repeat INR in 1 week      Hennie Duos , MD

## 2019-07-18 NOTE — Progress Notes (Signed)
This encounter was created in error - please disregard.

## 2019-07-24 ENCOUNTER — Non-Acute Institutional Stay (SKILLED_NURSING_FACILITY): Payer: Medicare Other | Admitting: Internal Medicine

## 2019-07-24 ENCOUNTER — Encounter: Payer: Self-pay | Admitting: Internal Medicine

## 2019-07-24 DIAGNOSIS — Z0000 Encounter for general adult medical examination without abnormal findings: Secondary | ICD-10-CM

## 2019-07-24 DIAGNOSIS — J9602 Acute respiratory failure with hypercapnia: Secondary | ICD-10-CM | POA: Diagnosis not present

## 2019-07-24 DIAGNOSIS — J9601 Acute respiratory failure with hypoxia: Secondary | ICD-10-CM

## 2019-07-24 DIAGNOSIS — D696 Thrombocytopenia, unspecified: Secondary | ICD-10-CM

## 2019-07-24 NOTE — Progress Notes (Signed)
Subjective:   [name] is a 78 y.o. male who presents for Medicare Annual/Subsequent preventive examination.  Review of Systems:  This is limited since patient is a poor historian.  In general no complaints of fever chills.  Skin does not complain of rashes or itching   head ears eyes nose mouth and throat no complaints of visual changes or sore throat.  Respiratory does not complain of shortness of breath or cough.  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   In general this is a alert elderly male in no distress lying in bed  His skin is warm and dry there is a shunt left upper arm currently wrapped-positive bruit.  Eyes visual acuity appears to be intact he does have icteric changes  Oropharynx is clear mucous membranes moist he has numerous extractions  Chest is clear to auscultation with somewhat poor respiratory effort there is no labored breathing.  Heart is irregular irregular rate and rhythm with a 2 out of 6 systolic murmur  Abdomen is soft nontender with positive bowel sounds  Muscle skeletal moves his upper extremities at baseline he is status post bilateral AKA surgical sites appear to be well-healed  Neurologic is grossly intact speech is clear he does not speak a whole lot  Psych he is oriented to self he is pleasant cooperative  Advanced Directives  07/24/2019 07/24/2019 07/04/2019 06/29/2019 06/09/2019 06/01/2019 05/25/2019  Does Patient Have a Medical Advance Directive? Yes Yes Yes Yes Yes Yes No  Type of Advance Directive Out of facility DNR (pink MOST or yellow form) Out of facility DNR (pink MOST or yellow form) Out of facility DNR (pink MOST or yellow form) Out of facility DNR (pink MOST or yellow form) Out of facility DNR (pink MOST or yellow form) Out of facility DNR (pink MOST or yellow form) -  Does patient want to make changes to medical advance directive? No - Patient declined No - Patient declined No - Patient declined No - Patient declined No - Patient declined No - Patient declined -  Copy of [place] in [place]  Would patient like information on creating a medical advance directive? - No - Patient declined - No - Patient declined No - Patient declined No - Patient declined No - Patient declined  Pre-existing out of facility DNR order (yellow form or pink MOST form) Pink MOST form placed in chart (order not valid for inpatient use);Yellow form placed in chart (order not valid for inpatient use) - Yellow form placed in chart (order not valid for inpatient use) Yellow form placed in chart (order not valid for inpatient use);Pink MOST form placed in chart (order not valid for inpatient use) Yellow form  placed in chart (order not valid for inpatient use) - -    Tobacco Social History   Tobacco Use  Smoking Status Former Smoker  . Types: Cigarettes  . Quit date: 06/30/2004  . Years since quitting: 15.0  Smokeless Tobacco Never Used     Counseling given: Not Answered   Clinical Intake:  Pre-visit preparation completed: Yes  Pain : No/denies pain     BMI - recorded: 62.16(accuracy of BMI suspect because patient is double amputee) Nutritional Risks: Other (Comment) Diabetes: No  How often do you need to have someone help you when you read instructions, pamphlets, or other written materials from your  doctor or pharmacy?: 5 - Always  Interpreter Needed?: No  Comments: Patient does have several complicating factors including end-stage renal disease as well as double lower extremity amputee  Past Medical History:  Diagnosis Date  . A-fib ([place])   . Anemia   . Blood transfusion   . BPH (benign prostatic hyperplasia)   . CHF (congestive heart failure) ([place])   . Diarrhea   . DM (diabetes mellitus) ([place])   . ESRD on hemodialysis ([place])    Started dialysis in 2009  . History of GI bleed    secondary to coumadin  . HTN (hypertension)   . Hyperlipidemia   . OSA (obstructive sleep apnea)    uses CPAP  . Secondary hyperparathyroidism of renal origin [facility])    Past Surgical History:  Procedure Laterality Date  . ABDOMINAL AORTOGRAM N/A 11/15/2018   Procedure: ABDOMINAL AORTOGRAM;  Surgeon: [name], MD;  Location: [place] [facility];  Service: Cardiovascular;  Laterality: N/A;  . AMPUTATION Left 12/06/2018   Procedure: AMPUTATION DIGIT LEFT FIFTH TOE;  Surgeon: Angelia Mould, MD;  Location: [place];  Service: Vascular;  Laterality: Left;  . AMPUTATION Bilateral 12/29/2018   Procedure: AMPUTATION ABOVE KNEE;  Surgeon: Waynetta Sandy, MD;  Location: [place];  Service: Vascular;  Laterality: Bilateral;  . APPLICATION OF WOUND VAC Right 02/17/2019   Procedure: Application Of Wound Vac;  Surgeon: Marty Heck, MD;  Location: [place];  Service: Vascular;  Laterality: Right;  . BVT  7/67/20   Left  Basilic Vein Transposition  . CHOLECYSTECTOMY    . EYE SURGERY     Catarct bil  . I&D EXTREMITY Right 02/17/2019   Procedure: Right above the kneee debridement;  Surgeon: Marty Heck, MD;  Location: [place];  Service: Vascular;  Laterality: Right;  . INSERTION OF DIALYSIS CATHETER  05/28/2012   Procedure: INSERTION OF DIALYSIS CATHETER;  Surgeon: Mal Misty, MD;  Location: [place];  Service: Vascular;  Laterality: Right;  . Left arm shuntogram.    . Left forearm  loop graft with 6 mm Gore-Tex graft.    . LOWER EXTREMITY ANGIOGRAPHY Bilateral 11/15/2018   Procedure: LOWER EXTREMITY ANGIOGRAPHY;  Surgeon: [name], MD;  Location: [place] [facility];  Service: Cardiovascular;  Laterality: Bilateral;  . Pars plana vitrectomy with 25-gauge system    . PERIPHERAL VASCULAR ATHERECTOMY Left 11/15/2018   Procedure: PERIPHERAL VASCULAR ATHERECTOMY;  Surgeon: [name], MD;  Location: [place] [facility];  Service: Cardiovascular;  Laterality: Left;  SFA with STENT  . PERIPHERAL VASCULAR BALLOON ANGIOPLASTY Left 11/15/2018   Procedure: PERIPHERAL VASCULAR BALLOON ANGIOPLASTY;  Surgeon: [name], MD;  Location: [place] [facility];  Service: Cardiovascular;  Laterality: Left;  PT   Family History  Problem Relation Age of Onset  .  Alzheimer's disease Mother   . Diabetes Father        Amputation:  bilateral legs  . Cancer Daughter        breast cancer  . Diabetes Son   . Heart disease Son        before age 16  . Hypertension Son   . Anesthesia problems Neg Hx    Social History   Socioeconomic History  . Marital status: Divorced    Spouse name: Not on file  . Number of children: Not on file  . Years of education: Not on file  . Highest education level: Not on file  Occupational History  . Not on file  Social Needs  . Financial resource strain: Not on file  . Food insecurity    Worry: Not on file    Inability: Not on file  . Transportation needs    Medical: Not on file    Non-medical: Not on file  Tobacco Use  . Smoking status: Former Smoker    Types: Cigarettes    Quit date: 06/30/2004    Years since quitting: 15.0  . Smokeless tobacco: Never Used  Substance and Sexual Activity  . Alcohol use: No  . Drug use: No  . Sexual activity: Never  Lifestyle  . Physical activity    Days per week: Not on file    Minutes per session: Not on file  . Stress: Not on file  Relationships  . Social Herbalist on phone: Not  on file    Gets together: Not on file    Attends religious service: Not on file    Active member of club or organization: Not on file    Attends meetings of clubs or organizations: Not on file    Relationship status: Not on file  Other Topics Concern  . Not on file  Social History Narrative     The patient is a former smoker, quit is 2005.  Does not     drink or abuse drugs.           Outpatient Encounter Medications as of 07/24/2019  Medication Sig  . acetaminophen (TYLENOL) 500 MG tablet Take 1,000 mg by mouth 3 (three) times daily.   . Amino Acids-Protein Hydrolys (FEEDING SUPPLEMENT, PRO-STAT SUGAR FREE 64,) LIQD Take 30 mLs by mouth 2 (two) times daily. 9a and 5p  . atorvastatin (LIPITOR) 20 MG tablet Take 20 mg by mouth daily.  . B Complex-C-Folic Acid (RENAL VITAMIN PO) Take 1 tablet by mouth at bedtime.   . calcium acetate (PHOSLO) 667 MG capsule Take 1,334 mg by mouth 3 (three) times daily with meals.  . Cholecalciferol (VITAMIN D3) 1.25 MG (50000 UT) CAPS Take 1 capsule by mouth once a week.  . dicyclomine (BENTYL) 20 MG tablet Take 20 mg by mouth 3 (three) times daily before meals.  Marland Kitchen doxercalciferol (HECTOROL) 4 MCG/2ML injection Inject 1 mL (2 mcg total) into the vein every Monday, Wednesday, and Friday with hemodialysis.  Marland Kitchen loperamide (IMODIUM A-D) 2 MG tablet Take 4 mg by mouth See admin instructions. Take 2 tablets ( 4 mg) by mouth after 1st loose stool as needed for diarrhea  . metoprolol tartrate (LOPRESSOR) 25 MG tablet Take 25 mg by mouth See admin instructions. Take 25 mg by mouth two times a day on Sun/Tues/Thurs/Sat and 25 mg at bedtime on Mon/Wed/Fri  . midodrine (PROAMATINE) 10 MG tablet Take one tablet (10 mg) by mouth on Monday, Wednesday, Friday  30 minutes prior to dialysis.  . Nutritional Supplements (ADULT NUTRITIONAL SUPPLEMENT PO) Take 1 each by mouth daily. Magic cup with dinner  . pantoprazole (PROTONIX) 40 MG tablet Take 1 tablet (40 mg total) by mouth  daily.  Marland Kitchen warfarin (COUMADIN) 2.5 MG tablet Take 2.5 mg by mouth See admin instructions. Take everyday except Mondays and Wednesdays  . warfarin (COUMADIN) 5 MG tablet Take 5 mg by mouth See admin instructions. Take Mondays and Wednesdays   No facility-administered encounter medications on file as of 07/24/2019.     Activities of Daily Living In your present state of health, do you have any difficulty performing the following activities: 02/15/2019 12/28/2018  Hearing? N N  Vision? N Y  Difficulty concentrating or making decisions? N Y  Walking or climbing stairs? Y Y  Dressing or bathing? Y Y  Doing errands, shopping? Tempie Donning  Some recent data might be hidden    Patient Care Team: Hennie Duos, MD as PCP - General (Internal Medicine) [name], MD as Consulting Physician (Nephrology) Rehab, [place] ([place]   Assessment:   This is a routine wellness examination for [place].  Exercise Activities and Dietary recommendations   Goals---  Goal is to maintain nutritional status as much as possible--- supportive care with his comorbidities including  end-stage renal disease as well as history of double lower extremity amputation -- Fall Risk No flowsheet data found. Is the patient's home free of loose throw rugs in walkways, pet beds, electrical cords, etc?   yes      Grab bars in the bathroom? yes      Handrails on the stairs?   not applicable      Adequate lighting?   yes    Depression Screen Patient scored a 7 out of 27 on mood disorder scale  Cognitive Function  Patient scored a 1 out of 15 on BIMS testing       There is no immunization history on file for this patient.  Qualifies for Shingles Vaccine? Decision pending  Screening Tests Health Maintenance  Topic Date Due  . PNA vac Low Risk Adult (1 of 2 - PCV13) 11/01/2005  . FOOT EXAM  12/29/2019 (Originally 11/01/1951)  . OPHTHALMOLOGY EXAM   12/29/2019 (Originally 11/01/1951)  . TETANUS/TDAP  07/18/2020 (Originally 11/02/1960)  . INFLUENZA VACCINE  07/29/2019  . HEMOGLOBIN A1C  08/17/2019  . URINE MICROALBUMIN  Discontinued   Cancer Screenings: Lung: Low Dose CT Chest recommended if Age 35-80 years, 30 pack-year currently smoking OR have quit w/in 15years. Patient does not qualify. Colorectal: decision pending  Additional Screenings: Hepatitis C Screening: deferred     Plan:   At this point continue supportive care with emphasis on nutritional support- comorbidities include bilateral lower extremity amputations and end-stage renal disease as well as atrial fibrillation GERD and hypertension as well as cognitive impairment--- supportive care to keep him comfortable enjoy as high a quality of life as possible I have personally reviewed and noted the following in the patient's chart:   . Medical and social history . Use of alcohol, tobacco or illicit drugs  . Current medications and supplements . Functional ability and status . Nutritional status . Physical activity . Advanced directives . List of other physicians . Hospitalizations, surgeries, and ER visits in previous 12 months . Vitals . Screenings to include cognitive, depression, and falls . Referrals and appointments   Discussion with patient about plan and goals was  difficult to complete because of patient's dementia and limited ability to comprehend      [place]an[place] Lewis, PA-C  07/25/2019

## 2019-07-24 NOTE — Progress Notes (Signed)
This encounter was created in error - please disregard.

## 2019-07-24 NOTE — Progress Notes (Deleted)
Subjective:   [name] is a 78 y.o. male who presents for Medicare Annual/Subsequent preventive examination.  Review of Systems:  ***       Objective:    Vitals: BP 103/62   Pulse 80   Temp (!) 97.4 F (36.3 C) (Oral)   Resp 19   Ht 3' (0.914 m)   Wt 114 lb 9.6 oz (52 kg)   BMI 62.17 kg/m   Body mass index is 62.17 kg/m.  Advanced Directives 07/24/2019 07/04/2019 06/29/2019 06/09/2019 06/01/2019 05/25/2019 05/25/2019  Does Patient Have a Medical Advance Directive? Yes Yes Yes Yes Yes No Yes  Type of Advance Directive Out of facility DNR (pink MOST or yellow form) Out of facility DNR (pink MOST or yellow form) Out of facility DNR (pink MOST or yellow form) Out of facility DNR (pink MOST or yellow form) Out of facility DNR (pink MOST or yellow form) - Out of facility DNR (pink MOST or yellow form)  Does patient want to make changes to medical advance directive? No - Patient declined No - Patient declined No - Patient declined No - Patient declined No - Patient declined - No - Patient declined  Copy of [place] in [place]  Would patient like information on creating a medical advance directive? No - Patient declined - No - Patient declined No - Patient declined No - Patient declined No - Patient declined -  Pre-existing out of facility DNR order (yellow form or pink MOST form) - Yellow form placed in chart (order not valid for inpatient use) Yellow form placed in chart (order not valid for inpatient use);Pink MOST form placed in chart (order not valid for inpatient use) Yellow form placed in chart (order not valid for inpatient use) - - -    Tobacco Social History   Tobacco Use  Smoking Status Former Smoker  . Types: Cigarettes  . Quit date: 06/30/2004  . Years since quitting: 15.0  Smokeless Tobacco Never Used     Counseling given: Not Answered   Clinical Intake:                       Past Medical History:  Diagnosis Date  .  A-fib ([place])   . Anemia   . Blood transfusion   . BPH (benign prostatic hyperplasia)   . CHF (congestive heart failure) ([place])   . Diarrhea   . DM (diabetes mellitus) ([place])   . ESRD on hemodialysis ([place])    Started dialysis in 2009  . History of GI bleed    secondary to coumadin  . HTN (hypertension)   . Hyperlipidemia   . OSA (obstructive sleep apnea)    uses CPAP  . Secondary hyperparathyroidism of renal origin [facility])    Past Surgical History:  Procedure Laterality Date  . ABDOMINAL AORTOGRAM N/A 11/15/2018   Procedure: ABDOMINAL AORTOGRAM;  Surgeon: [name], MD;  Location: [place] [facility];  Service: Cardiovascular;  Laterality: N/A;  . AMPUTATION Left 12/06/2018   Procedure: AMPUTATION DIGIT LEFT FIFTH TOE;  Surgeon: Angelia Mould, MD;  Location: [place];  Service: Vascular;  Laterality: Left;  . AMPUTATION Bilateral 12/29/2018   Procedure: AMPUTATION ABOVE KNEE;  Surgeon: Waynetta Sandy, MD;  Location: [place];  Service: Vascular;  Laterality: Bilateral;  . APPLICATION OF WOUND VAC Right 02/17/2019   Procedure: Application Of Wound Vac;  Surgeon: Marty Heck, MD;  [facility];  Service: Vascular;  Laterality: Right;  . BVT  04/05/80   Left  Basilic Vein Transposition  . CHOLECYSTECTOMY    . EYE SURGERY     Catarct bil  . I&D EXTREMITY Right 02/17/2019   Procedure: Right above the kneee debridement;  Surgeon: Marty Heck, MD;  Location: [place];  Service: Vascular;  Laterality: Right;  . INSERTION OF DIALYSIS CATHETER  05/28/2012   Procedure: INSERTION OF DIALYSIS CATHETER;  Surgeon: Mal Misty, MD;  Location: [facility];  Service: Vascular;  Laterality: Right;  . Left arm shuntogram.    . Left forearm loop graft with 6 mm Gore-Tex graft.    . LOWER EXTREMITY ANGIOGRAPHY Bilateral 11/15/2018   Procedure: LOWER EXTREMITY ANGIOGRAPHY;  Surgeon: [name], MD;  Location: [place] [facility];  Service: Cardiovascular;  Laterality:  Bilateral;  . Pars plana vitrectomy with 25-gauge system    . PERIPHERAL VASCULAR ATHERECTOMY Left 11/15/2018   Procedure: PERIPHERAL VASCULAR ATHERECTOMY;  Surgeon: [name], MD;  Location: [place] [facility];  Service: Cardiovascular;  Laterality: Left;  SFA with STENT  . PERIPHERAL VASCULAR BALLOON ANGIOPLASTY Left 11/15/2018   Procedure: PERIPHERAL VASCULAR BALLOON ANGIOPLASTY;  Surgeon: [name], MD;  Location: [place] [facility];  Service: Cardiovascular;  Laterality: Left;  PT   Family History  Problem Relation Age of Onset  . Alzheimer's disease Mother   . Diabetes Father        Amputation:  bilateral legs  . Cancer Daughter        breast cancer  . Diabetes Son   . Heart disease Son        before age 48  . Hypertension Son   . Anesthesia problems Neg Hx    Social History   Socioeconomic History  . Marital status: Divorced    Spouse name: Not on file  . Number of children: Not on file  . Years of education: Not on file  . Highest education level: Not on file  Occupational History  . Not on file  Social Needs  . Financial resource strain: Not on file  . Food insecurity    Worry: Not on file    Inability: Not on file  . Transportation needs    Medical: Not on file    Non-medical: Not on file  Tobacco Use  . Smoking status: Former Smoker    Types: Cigarettes    Quit date: 06/30/2004    Years since quitting: 15.0  . Smokeless tobacco: Never Used  Substance and Sexual Activity  . Alcohol use: No  . Drug use: No  . Sexual activity: Never  Lifestyle  . Physical activity    Days per week: Not on file    Minutes per session: Not on file  . Stress: Not on file  Relationships  . Social Herbalist on phone: Not on file    Gets together: Not on file    Attends religious service: Not on file    Active member of club or organization: Not on file    Attends meetings of clubs or organizations: Not on file    Relationship status: Not on file   Other Topics Concern  . Not on file  Social History Narrative     The patient is a former smoker, quit is 2005.  Does not     drink or abuse drugs.  Outpatient Encounter Medications as of 07/24/2019  Medication Sig  . acetaminophen (TYLENOL) 500 MG tablet Take 1,000 mg by mouth 3 (three) times daily.   . Amino Acids-Protein Hydrolys (FEEDING SUPPLEMENT, PRO-STAT SUGAR FREE 64,) LIQD Take 30 mLs by mouth 2 (two) times daily. 9a and 5p  . atorvastatin (LIPITOR) 20 MG tablet Take 20 mg by mouth daily.  . B Complex-C-Folic Acid (RENAL VITAMIN PO) Take 1 tablet by mouth at bedtime.   . calcium acetate (PHOSLO) 667 MG capsule Take 1,334 mg by mouth 3 (three) times daily with meals.  . Cholecalciferol (VITAMIN D3) 1.25 MG (50000 UT) CAPS Take 1 capsule by mouth once a week.  . dicyclomine (BENTYL) 20 MG tablet Take 20 mg by mouth 3 (three) times daily before meals.  Marland Kitchen doxercalciferol (HECTOROL) 4 MCG/2ML injection Inject 1 mL (2 mcg total) into the vein every Monday, Wednesday, and Friday with hemodialysis.  Marland Kitchen loperamide (IMODIUM A-D) 2 MG tablet Take 4 mg by mouth See admin instructions. Take 2 tablets ( 4 mg) by mouth after 1st loose stool as needed for diarrhea  . metoprolol tartrate (LOPRESSOR) 25 MG tablet Take 25 mg by mouth See admin instructions. Take 25 mg by mouth two times a day on Sun/Tues/Thurs/Sat and 25 mg at bedtime on Mon/Wed/Fri  . midodrine (PROAMATINE) 10 MG tablet Take one tablet (10 mg) by mouth on Monday, Wednesday, Friday 30 minutes prior to dialysis.  . Nutritional Supplements (ADULT NUTRITIONAL SUPPLEMENT PO) Take 1 each by mouth daily. Magic cup with dinner  . pantoprazole (PROTONIX) 40 MG tablet Take 1 tablet (40 mg total) by mouth daily.  Marland Kitchen warfarin (COUMADIN) 2.5 MG tablet Take 2.5 mg by mouth See admin instructions. Take everyday except Mondays and Wednesdays  . warfarin (COUMADIN) 5 MG tablet Take 5 mg by mouth See admin instructions. Take Mondays and  Wednesdays   No facility-administered encounter medications on file as of 07/24/2019.     Activities of Daily Living In your present state of health, do you have any difficulty performing the following activities: 02/15/2019 12/28/2018  Hearing? N N  Vision? N Y  Difficulty concentrating or making decisions? N Y  Walking or climbing stairs? Y Y  Dressing or bathing? Y Y  Doing errands, shopping? Tempie Donning  Some recent data might be hidden    Patient Care Team: Hennie Duos, MD as PCP - General (Internal Medicine) [name], MD as Consulting Physician (Nephrology) Rehab, [place] ([place]   Assessment:   This is a routine wellness examination for [name].  Exercise Activities and Dietary recommendations    Goals   None     Fall Risk No flowsheet data found. Is the patient's home free of loose throw rugs in walkways, pet beds, electrical cords, etc?   {Blank single:19197::"yes","no"}      Grab bars in the bathroom? {Blank single:19197::"yes","no"}      Handrails on the stairs?   {Blank single:19197::"yes","no"}      Adequate lighting?   {Blank single:19197::"yes","no"}  Timed Get Up and Go Performed: ***  Depression Screen No flowsheet data found.  Cognitive Function         There is no immunization history on file for this patient.  Qualifies for Shingles Vaccine? ***  Screening Tests Health Maintenance  Topic Date Due  . PNA vac Low Risk Adult (1 of 2 - PCV13) 11/01/2005  . FOOT EXAM  12/29/2019 (Originally 11/01/1951)  . OPHTHALMOLOGY  EXAM  12/29/2019 (Originally 11/01/1951)  . TETANUS/TDAP  07/18/2020 (Originally 11/02/1959)  . INFLUENZA VACCINE  07/29/2019  . HEMOGLOBIN A1C  08/17/2019  . URINE MICROALBUMIN  Discontinued   Cancer Screenings: Lung: Low Dose CT Chest recommended if Age 54-80 years, 30 pack-year currently smoking OR have quit w/in 15years. Patient {DOES NOT does:27190::"does  not"} qualify. Colorectal: ***  Additional Screenings: *** Hepatitis C Screening:      Plan:   ***  I have personally reviewed and noted the following in the patient's chart:   . Medical and social history . Use of alcohol, tobacco or illicit drugs  . Current medications and supplements . Functional ability and status . Nutritional status . Physical activity . Advanced directives . List of other physicians . Hospitalizations, surgeries, and ER visits in previous 12 months . Vitals . Screenings to include cognitive, depression, and falls . Referrals and appointments  In addition, I have reviewed and discussed with patient certain preventive protocols, quality metrics, and best practice recommendations. A written personalized care plan for preventive services as well as general preventive health recommendations were provided to patient.     Elmore Guise, [place]  07/24/2019

## 2019-07-25 ENCOUNTER — Encounter: Payer: Self-pay | Admitting: Internal Medicine

## 2019-07-27 ENCOUNTER — Encounter (HOSPITAL_COMMUNITY): Payer: Self-pay | Admitting: Emergency Medicine

## 2019-07-27 ENCOUNTER — Emergency Department (HOSPITAL_COMMUNITY): Payer: Medicare Other

## 2019-07-27 ENCOUNTER — Emergency Department (HOSPITAL_COMMUNITY)
Admission: EM | Admit: 2019-07-27 | Discharge: 2019-07-27 | Disposition: A | Discharge status: Home / Self Care | Payer: Medicare Other | Attending: Emergency Medicine | Admitting: Emergency Medicine

## 2019-07-27 ENCOUNTER — Non-Acute Institutional Stay (SKILLED_NURSING_FACILITY): Payer: Medicare Other | Admitting: Internal Medicine

## 2019-07-27 ENCOUNTER — Encounter: Payer: Self-pay | Admitting: Internal Medicine

## 2019-07-27 ENCOUNTER — Other Ambulatory Visit: Payer: Self-pay

## 2019-07-27 DIAGNOSIS — I132 Hypertensive heart and chronic kidney disease with heart failure and with stage 5 chronic kidney disease, or end stage renal disease: Secondary | ICD-10-CM | POA: Diagnosis not present

## 2019-07-27 DIAGNOSIS — I9589 Other hypotension: Secondary | ICD-10-CM | POA: Diagnosis not present

## 2019-07-27 DIAGNOSIS — Z89611 Acquired absence of right leg above knee: Secondary | ICD-10-CM | POA: Diagnosis not present

## 2019-07-27 DIAGNOSIS — I4891 Unspecified atrial fibrillation: Secondary | ICD-10-CM | POA: Diagnosis not present

## 2019-07-27 DIAGNOSIS — N186 End stage renal disease: Secondary | ICD-10-CM | POA: Insufficient documentation

## 2019-07-27 DIAGNOSIS — Z992 Dependence on renal dialysis: Secondary | ICD-10-CM | POA: Diagnosis not present

## 2019-07-27 DIAGNOSIS — I509 Heart failure, unspecified: Secondary | ICD-10-CM | POA: Diagnosis not present

## 2019-07-27 DIAGNOSIS — Z87891 Personal history of nicotine dependence: Secondary | ICD-10-CM | POA: Insufficient documentation

## 2019-07-27 DIAGNOSIS — R61 Generalized hyperhidrosis: Secondary | ICD-10-CM

## 2019-07-27 DIAGNOSIS — Z89612 Acquired absence of left leg above knee: Secondary | ICD-10-CM | POA: Diagnosis not present

## 2019-07-27 DIAGNOSIS — I959 Hypotension, unspecified: Secondary | ICD-10-CM

## 2019-07-27 DIAGNOSIS — Z7901 Long term (current) use of anticoagulants: Secondary | ICD-10-CM | POA: Insufficient documentation

## 2019-07-27 DIAGNOSIS — R5383 Other fatigue: Secondary | ICD-10-CM

## 2019-07-27 DIAGNOSIS — Z8619 Personal history of other infectious and parasitic diseases: Secondary | ICD-10-CM

## 2019-07-27 DIAGNOSIS — Z79899 Other long term (current) drug therapy: Secondary | ICD-10-CM | POA: Diagnosis not present

## 2019-07-27 DIAGNOSIS — R791 Abnormal coagulation profile: Secondary | ICD-10-CM | POA: Diagnosis not present

## 2019-07-27 LAB — BASIC METABOLIC PANEL
Anion gap: 17 — ABNORMAL HIGH (ref 5–15)
BUN: 48 mg/dL — ABNORMAL HIGH (ref 8–23)
CO2: 26 mmol/L (ref 22–32)
Calcium: 10.2 mg/dL (ref 8.9–10.3)
Chloride: 95 mmol/L — ABNORMAL LOW (ref 98–111)
Creatinine, Ser: 4.61 mg/dL — ABNORMAL HIGH (ref 0.61–1.24)
GFR calc Af Amer: 13 mL/min — ABNORMAL LOW (ref >60)
GFR calc non Af Amer: 11 mL/min — ABNORMAL LOW (ref >60)
Glucose, Bld: 143 mg/dL — ABNORMAL HIGH (ref 70–99)
Potassium: 3.5 mmol/L (ref 3.5–5.1)
Sodium: 138 mmol/L (ref 135–145)

## 2019-07-27 LAB — CBC WITH DIFFERENTIAL/PLATELET
Abs Immature Granulocytes: 0.24 10*3/uL — ABNORMAL HIGH (ref 0.00–0.07)
Basophils Absolute: 0.1 10*3/uL (ref 0.0–0.1)
Basophils Relative: 0 %
Eosinophils Absolute: 0.1 10*3/uL (ref 0.0–0.5)
Eosinophils Relative: 0 %
HCT: 40.2 % (ref 39.0–52.0)
Hemoglobin: 12.4 g/dL — ABNORMAL LOW (ref 13.0–17.0)
Immature Granulocytes: 1 %
Lymphocytes Relative: 6 %
Lymphs Abs: 1.2 10*3/uL (ref 0.7–4.0)
MCH: 27.6 pg (ref 26.0–34.0)
MCHC: 30.8 g/dL (ref 30.0–36.0)
MCV: 89.3 fL (ref 80.0–100.0)
Monocytes Absolute: 1.6 10*3/uL — ABNORMAL HIGH (ref 0.1–1.0)
Monocytes Relative: 9 %
Neutro Abs: 15.5 10*3/uL — ABNORMAL HIGH (ref 1.7–7.7)
Neutrophils Relative %: 84 %
Platelets: 171 10*3/uL (ref 150–400)
RBC: 4.5 MIL/uL (ref 4.22–5.81)
RDW: 17.4 % — ABNORMAL HIGH (ref 11.5–15.5)
WBC: 18.6 10*3/uL — ABNORMAL HIGH (ref 4.0–10.5)
nRBC: 0 % (ref 0.0–0.2)

## 2019-07-27 MED ORDER — SODIUM CHLORIDE 0.9 % IV BOLUS
500.0000 mL | Freq: Once | INTRAVENOUS | Status: AC
  Administered 2019-07-27: 500 mL via INTRAVENOUS

## 2019-07-27 NOTE — Progress Notes (Signed)
Location:  [place] Room Number: 308-W Place of Service:  SNF [phone]) Provider:  Granville Lewis, PA-C  Hennie Duos, MD  Patient Care Team: Hennie Duos, MD as PCP - General (Internal Medicine) [name], MD as Consulting Physician (Nephrology) Rehab, [facility], [facility]  Extended Emergency Contact Information Primary Emergency Contact: [name],[name] Address: [place], [place] [address] [name] Phone: [phone] Work Phone: [phone] Mobile Phone: [phone] Relation: Daughter Secondary Emergency Contact: [name] Mobile Phone: [phone] Relation: Other  Code Status:  DNR Goals of care: Advanced Directive information Advanced Directives 07/24/2019  Does Patient Have a Medical Advance Directive? Yes  Type of Advance Directive Out of facility DNR (pink MOST or yellow form)  Does patient want to make changes to medical advance directive? No - Patient declined  Copy of [place] in Chart? -  Would patient like information on creating a medical advance directive? -  Pre-existing out of facility DNR order (yellow form or pink MOST form) Pink MOST form placed in chart (order not valid for inpatient use);Yellow form placed in chart (order not valid for inpatient use)    Chief complaint-acute visit secondary to lethargy- elevated INR- hypotension- concerns for possible sepsis   HPI:  Pt is a 78 y.o. [name] seen today for an acute visit secondary to the above issues.  Patient is a long-term resident of facility with a history of end-stage renal disease on hemodialysis Monday Wednesday and Fridays he also history of atrial fibrillation on Coumadin as well as chronic anemia hypertension--dementia  Also peripheral vascular disease with bilateral above-the-knee amputations in January of this year He does have a  previous hospitalization in May 2020 for sepsis his right AKA stump  Patient's INR is are being followed at facility and was significantly elevated at 6.5 today.  There was no evidence of increased bruising or bleeding.  However it has been noted his blood pressures are running a bit lower than normal he does have some history of hypotension and actually has orders for Midodrine on dialysis days --appears systolics have been in the 80s to 90s today.  Which is a nondialysis day----He does receive Lopressor with his history of atrial fibrillation  When I entered the room he was t difficult to arouse but eventually did open his eyes and started speaking was answering simple questions --He indicated he did not feel well and he was diaphoretic-- he has not been running an elevated temperature he is borderline tachycardic.  Blood sugar was taken and it was 181  He has been started on doxycycline for a sacral wound -there are concerns this may be somewhat worsening    Past Medical History:  Diagnosis Date  . A-fib ([place])   . Anemia   . Blood transfusion   . BPH (benign prostatic hyperplasia)   . CHF (congestive heart failure) ([place])   . Diarrhea   . DM (diabetes mellitus) ([place])   . ESRD on hemodialysis ([place])    Started dialysis in 2009  . History of GI bleed    secondary to coumadin  . HTN (hypertension)   . Hyperlipidemia   . OSA (obstructive sleep apnea)    uses CPAP  . Secondary hyperparathyroidism of renal origin [facility])    Past Surgical History:  Procedure Laterality Date  . ABDOMINAL AORTOGRAM N/A 11/15/2018  Procedure: ABDOMINAL AORTOGRAM;  Surgeon: [name], MD;  Location: [place] [facility];  Service: Cardiovascular;  Laterality: N/A;  . AMPUTATION Left 12/06/2018   Procedure: AMPUTATION DIGIT LEFT FIFTH TOE;  Surgeon: Angelia Mould, MD;  Location: [place];  Service: Vascular;  Laterality: Left;  . AMPUTATION Bilateral 12/29/2018   Procedure: AMPUTATION ABOVE  KNEE;  Surgeon: Waynetta Sandy, MD;  Location: [place];  Service: Vascular;  Laterality: Bilateral;  . APPLICATION OF WOUND VAC Right 02/17/2019   Procedure: Application Of Wound Vac;  Surgeon: Marty Heck, MD;  Location: [place];  Service: Vascular;  Laterality: Right;  . BVT  05/15/83   Left  Basilic Vein Transposition  . CHOLECYSTECTOMY    . EYE SURGERY     Catarct bil  . I&D EXTREMITY Right 02/17/2019   Procedure: Right above the kneee debridement;  Surgeon: Marty Heck, MD;  Location: [place];  Service: Vascular;  Laterality: Right;  . INSERTION OF DIALYSIS CATHETER  05/28/2012   Procedure: INSERTION OF DIALYSIS CATHETER;  Surgeon: Mal Misty, MD;  Location: [facility];  Service: Vascular;  Laterality: Right;  . Left arm shuntogram.    . Left forearm loop graft with 6 mm Gore-Tex graft.    . LOWER EXTREMITY ANGIOGRAPHY Bilateral 11/15/2018   Procedure: LOWER EXTREMITY ANGIOGRAPHY;  Surgeon: [name], MD;  Location: [place] [facility];  Service: Cardiovascular;  Laterality: Bilateral;  . Pars plana vitrectomy with 25-gauge system    . PERIPHERAL VASCULAR ATHERECTOMY Left 11/15/2018   Procedure: PERIPHERAL VASCULAR ATHERECTOMY;  Surgeon: [name], MD;  Location: [facility];  Service: Cardiovascular;  Laterality: Left;  SFA with STENT  . PERIPHERAL VASCULAR BALLOON ANGIOPLASTY Left 11/15/2018   Procedure: PERIPHERAL VASCULAR BALLOON ANGIOPLASTY;  Surgeon: [name], MD;  Location: [place] [facility];  Service: Cardiovascular;  Laterality: Left;  PT    Allergies  Allergen Reactions  . Occlusive Silicone Sheets [Silicone] Other (See Comments)    "Allergic," per MAR  . Other Other (See Comments)    Unknown reaction to Occlusive adhesive- "Allergic," per MAR  . Tape Itching and Other (See Comments)    Use Cloth tape only, please    Outpatient Encounter Medications as of 07/27/2019  Medication Sig  . acetaminophen (TYLENOL) 500 MG tablet  Take 1,000 mg by mouth 3 (three) times daily.   . Amino Acids-Protein Hydrolys (FEEDING SUPPLEMENT, PRO-STAT SUGAR FREE 64,) LIQD Take 30 mLs by mouth 2 (two) times daily. 9a and 5p  . atorvastatin (LIPITOR) 20 MG tablet Take 20 mg by mouth daily.  . B Complex-C-Folic Acid (RENAL VITAMIN PO) Take 1 tablet by mouth at bedtime.   . calcium acetate (PHOSLO) 667 MG capsule Take 1,334 mg by mouth 3 (three) times daily with meals.  . Cholecalciferol (VITAMIN D3) 1.25 MG (50000 UT) CAPS Take 1 capsule by mouth once a week.  . dicyclomine (BENTYL) 20 MG tablet Take 20 mg by mouth 3 (three) times daily before meals.  Marland Kitchen doxercalciferol (HECTOROL) 4 MCG/2ML injection Inject 1 mL (2 mcg total) into the vein every Monday, Wednesday, and Friday with hemodialysis.  Marland Kitchen doxycycline (VIBRA-TABS) 100 MG tablet Take 100 mg by mouth 2 (two) times daily.  Marland Kitchen loperamide (IMODIUM A-D) 2 MG tablet Take 4 mg by mouth See admin instructions. Take 2 tablets ( 4 mg) by mouth after 1st loose stool as needed for diarrhea  . metoprolol tartrate (LOPRESSOR) 25 MG tablet  Take 25 mg by mouth See admin instructions. Take 25 mg by mouth two times a day on Sun/Tues/Thurs/Sat and 25 mg at bedtime on Mon/Wed/Fri  . midodrine (PROAMATINE) 10 MG tablet Take one tablet (10 mg) by mouth on Monday, Wednesday, Friday 30 minutes prior to dialysis.  . Nutritional Supplements (ADULT NUTRITIONAL SUPPLEMENT PO) Take 1 each by mouth daily. Magic cup with dinner  . pantoprazole (PROTONIX) 40 MG tablet Take 1 tablet (40 mg total) by mouth daily.  Marland Kitchen warfarin (COUMADIN) 2.5 MG tablet Take 2.5 mg by mouth See admin instructions. Take everyday EXCEPT Mondays and Wednesdays  . warfarin (COUMADIN) 5 MG tablet Take 5 mg by mouth See admin instructions. Take Mondays and Wednesdays   No facility-administered encounter medications on file as of 07/27/2019.     Review of Systems This is somewhat difficult since patient is a poor historian in general he says he  is not feeling well feels worse than he did yesterday- Per nurse he is been more lethargic today although at times previously has had some periods of lethargy.  Skin--he is not complaining of rashes or itching he does appear to be somewhat diaphoretic  Head ears eyes nose mouth and throat does not complain of a sore throat or visual changes  Respiratory he is not complaining of a cough or shortness of breath  Cardiac is not complaining of chest pain  GI does not really complain of abdominal pain no nausea or vomiting has been noted  GU- Has end-stage renal disease does not complain of dysuria.  Musculoskeletal he is status post above-the-knee amputation -complains  of being weak but does not really complain of joint pain  Neurologic does complain of weakness nursing staff has noted some increased lethargy  Psych does not appear overtly anxious    There is no immunization history on file for this patient. Pertinent  Health Maintenance Due  Topic Date Due  . PNA vac Low Risk Adult (1 of 2 - PCV13) 11/01/2005  . FOOT EXAM  12/29/2019 (Originally 11/01/1951)  . OPHTHALMOLOGY EXAM  12/29/2019 (Originally 11/01/1951)  . INFLUENZA VACCINE  07/29/2019  . HEMOGLOBIN A1C  08/17/2019  . URINE MICROALBUMIN  Discontinued   No flowsheet data found. Functional Status Survey:   He is afebrile pulse of 100 respirations of 19 blood pressure 86/46 oxygen saturation has been in the 90s CBG is 181  Body mass index is 65.15 kg/m. Physical Exam   General this is a frail elderly [name] in no acute distress when I entered the room he was difficult to arouse--but eventually did open his eyes and did start to speak  Skin he is moderately diaphoretic-sacral wound is currently covered per discussion with nursing apparently this appears to possibly be worsening a bit --with some drainage  Eyes visual acuity appears grossly intact he does make eye contact he does have icteric changes  Oropharynx he does  have numerous extractions mucous membranes appear fairly moist  Chest is clear to auscultation with somewhat shallow air entry--no labored breathing  Heart is regular irregular rate and rhythm with rate of around 100  Abdomen is soft does not appear to be tender there are active bowel sounds  Musculoskeletal he is status post above-the-knee amputations bilaterally moves his upper extremities at baseline  Neurologic----initially difficult to arouse-- he is now  more alert--he does speak some could not really appreciate lateralizing findings--he is generally weak  Psych he is oriented to self is able to speak some he  does not speak much he is able to tell as he does not feel good--points  his finger down when asked how he is feeling   Labs reviewed: Recent Labs    02/20/19 1300  05/28/19 0253 05/29/19 0541 05/29/19 2316 05/30/19 0727 05/31/19 1319  NA 137   < > 137  --   --  137 136  K 3.0*   < > 3.7  --   --  5.1 3.6  CL 101   < > 102  --   --  101 98  CO2 23   < > 21*  --   --  17* 24  GLUCOSE 101*   < > 151*  --   --  99 140*  BUN 27*   < > 80*  --   --  158* 44*  CREATININE 3.41*   < > 6.12*  --   --  8.32* 4.29*  CALCIUM 7.7*   < > 9.3  --   --  10.0 9.1  MG  --    < > 2.0 2.1 2.4  --   --   PHOS 2.4*  --   --   --   --  2.6 3.0   < > = values in this interval not displayed.   Recent Labs    05/26/19 0214 05/27/19 0348 05/28/19 0253 05/30/19 0727 05/31/19 1319  AST 49* 55* 31  --   --   ALT 44 55* 42  --   --   ALKPHOS 235* 272* 261*  --   --   BILITOT 0.7 0.8 0.6  --   --   PROT 6.8 7.0 6.5  --   --   ALBUMIN 2.4* 2.4* 2.2* 2.3* 2.3*   Recent Labs    12/Edward/19 0227  02/15/19 1242  05/Edward/20 1421  05/29/19 2316 05/30/19 0727 05/31/19 1319  WBC 15.6*   < > 14.7*   < > 17.5*   < > 15.8* 14.6* 10.3  NEUTROABS 13.0*  --  11.8*  --  14.2*  --   --   --   --   HGB 10.5*   < > 10.1*   < > 10.7*   < > 6.8* 7.4* 7.6*  HCT 33.5*   < > 35.1*   < > 35.1*   < > 21.8*  23.1* 24.7*  MCV 99.4   < > 90.5   < > 89.1   < > 86.2 85.9 88.5  PLT 204   < > 227   < > 251   < > 206 192 220   < > = values in this interval not displayed.   Lab Results  Component Value Date   TSH 1.839 05/Edward/2020   Lab Results  Component Value Date   HGBA1C 4.9 02/16/2019   Lab Results  Component Value Date   CHOL 113 11/11/2018   HDL 39 (L) 11/11/2018   LDLCALC 62 11/11/2018   TRIG 60 11/11/2018   CHOLHDL 2.9 11/11/2018    Significant Diagnostic Results in last 30 days:  No results found.  Assessment/Plan  --History of  increased lethargy- hypotension-- diaphoresis-with significantly elevated INR-- will send to the ER for concerns of acute process- sepsis would be a concern  [id number]

## 2019-07-27 NOTE — ED Triage Notes (Signed)
Per EMS: [name] called EMS because pt was hypotensive at 83/64. EMS reports they got 106/56 on arrival. Pt has existing sacral wound. Dialysis pt. Pt A/O x4

## 2019-07-27 NOTE — ED Notes (Signed)
ptar called by dee pt is number 2 on the list

## 2019-07-27 NOTE — ED Provider Notes (Signed)
[place] EMERGENCY DEPARTMENT Provider Note   CSN: [id number] Arrival date & time: 07/27/19  1508     History   Chief Complaint Chief Complaint  Patient presents with  . Hypotension    HPI LEV CERVONE is a 78 y.o. male.     Patient is a 78 year old male with extensive past medical history including diabetes, end-stage renal disease on hemodialysis, CHF, atrial fibrillation, and bilateral above-the-knee amputee.  Patient presents here today for evaluation of low blood pressure.  Patient's blood pressure was measured at his extended care facility and was found to be 83 systolic.  Patient was then transported here by EMS for evaluation.  Patient denies to me he is experiencing any weakness, chest pain, difficulty breathing, fever, or other complaints.  He tells me he feels normal.  Per EMS, blood pressure was in the 518A systolic and is in the 416S systolic upon arrival to the ER.  The history is provided by the patient.    Past Medical History:  Diagnosis Date  . A-fib ([place])   . Anemia   . Blood transfusion   . BPH (benign prostatic hyperplasia)   . CHF (congestive heart failure) ([place])   . Diarrhea   . DM (diabetes mellitus) ([place])   . ESRD on hemodialysis ([place])    Started dialysis in 2009  . History of GI bleed    secondary to coumadin  . HTN (hypertension)   . Hyperlipidemia   . OSA (obstructive sleep apnea)    uses CPAP  . Secondary hyperparathyroidism of renal origin [facility])     Patient Active Problem List   Diagnosis Date Noted  . Hypertensive heart and kidney disease with chronic diastolic congestive heart failure and stage 5 chronic kidney disease on chronic dialysis ([place]) 03/24/2019  . End stage renal disease on dialysis due to type 2 diabetes mellitus ([place]) 03/24/2019  . 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 ([place]) 03/24/2019  . Anemia due to end stage renal disease ([place]) 03/24/2019  . Protein-calorie malnutrition, severe ([place])  03/24/2019  . Hyperlipidemia associated with type 2 diabetes mellitus ([place]) 02/22/2019  . Amputation stump infection ([place]) 02/15/2019  . Conjunctivitis of left eye 01/04/2019  . 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[place]) 12/30/2018  . Open wound of left foot   . Encounter for monitoring Coumadin therapy   . OSA (obstructive sleep apnea)   . HTN (hypertension)   . ESRD on hemodialysis ([place])   . BPH (benign prostatic hyperplasia)   . Hypotension 12/07/2018  . GERD (gastroesophageal reflux disease) 12/07/2018  . Pressure injury of skin 12/03/2018  . Symptomatic anemia 12/02/2018  . Acute on chronic blood loss anemia 12/02/2018  . 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[place]) 12/02/2018  . Lower GI bleeding 12/02/2018  . Heme positive stool   . Acute on chronic diastolic (congestive) heart failure ([place]) 11/19/2018  . Acute CVA (cerebrovascular accident) ([place]) 11/19/2018  . Hypoglycemia 11/19/2018  . Peripheral vascular disease due to secondary diabetes ([place]) 11/19/2018  . Dry gangrene ([place]) 11/19/2018  . 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 DNR (do not resuscitate) discussion   . Goals of care, counseling/discussion   . Palliative care by specialist   . Acute hypoxemic respiratory failure ([place]) 11/06/2018  . Volume overload 11/06/2018  . Multifocal pneumonia 11/06/2018  . Acute metabolic encephalopathy 06/26/1600  . Physical deconditioning 11/06/2018  . Acute respiratory failure with hypoxia and hypercapnia ([place]) 11/04/2018  . CAP (community acquired  pneumonia) 11/04/2018  . Pleural effusion, right 11/04/2018  . Increased ammonia level 11/04/2018  . Abnormal CT of the abdomen 11/04/2018  . Atrial fibrillation with RVR ([place]) 11/04/2018  . Sepsis ([place]) 10/21/2018  . Acute encephalopathy 10/21/2018  . Elevated alkaline phosphatase level 10/21/2018  . Chronic anemia 10/21/2018  . Thrombocytopenia ([place]) 10/21/2018  . Pulmonary HTN ([place]) 03/04/2017  . Aortic  valve stenosis 03/04/2017  . Hypertension, accelerated with heart disease, without CHF 01/12/2017  . Dyspnea on exertion 01/12/2017  . ESRD (end stage renal disease) ([place]) 06/07/2012  . Hyperlipidemia 04/24/2011  . HYPERTENSION, BENIGN 10/24/2009  . Type 2 diabetes mellitus with hypertension and end stage renal disease on dialysis ([place]) 10/23/2009  . OBSTRUCTIVE SLEEP APNEA 10/23/2009  . PAF (paroxysmal atrial fibrillation) ([place]) 10/23/2009  . CHF (congestive heart failure) ([place]) 10/23/2009    Past Surgical History:  Procedure Laterality Date  . ABDOMINAL AORTOGRAM N/A 11/15/2018   Procedure: ABDOMINAL AORTOGRAM;  Surgeon: [name], MD;  Location: [facility];  Service: Cardiovascular;  Laterality: N/A;  . AMPUTATION Left 12/06/2018   Procedure: AMPUTATION DIGIT LEFT FIFTH TOE;  Surgeon: Angelia Mould, MD;  Location: [facility];  Service: Vascular;  Laterality: Left;  . AMPUTATION Bilateral 12/29/2018   Procedure: AMPUTATION ABOVE KNEE;  Surgeon: Waynetta Sandy, MD;  Location: [facility];  Service: Vascular;  Laterality: Bilateral;  . APPLICATION OF WOUND VAC Right 02/17/2019   Procedure: Application Of Wound Vac;  Surgeon: Marty Heck, MD;  Location: [facility];  Service: Vascular;  Laterality: Right;  . BVT  0/27/74   Left  Basilic Vein Transposition  . CHOLECYSTECTOMY    . EYE SURGERY     Catarct bil  . I&D EXTREMITY Right 02/17/2019   Procedure: Right above the kneee debridement;  Surgeon: Marty Heck, MD;  Location: [facility];  Service: Vascular;  Laterality: Right;  . INSERTION OF DIALYSIS CATHETER  05/28/2012   Procedure: INSERTION OF DIALYSIS CATHETER;  Surgeon: Mal Misty, MD;  Location[facility];  Service: Vascular;  Laterality: Right;  . Left arm shuntogram.    . Left forearm loop graft with 6 mm Gore-Tex graft.    . LOWER EXTREMITY ANGIOGRAPHY Bilateral 11/15/2018   Procedure: LOWER EXTREMITY ANGIOGRAPHY;  Surgeon: [name], MD;   Location: [facility];  Service: Cardiovascular;  Laterality: Bilateral;  . Pars plana vitrectomy with 25-gauge system    . PERIPHERAL VASCULAR ATHERECTOMY Left 11/15/2018   Procedure: PERIPHERAL VASCULAR ATHERECTOMY;  Surgeon: [name], MD;  Location: [facility];  Service: Cardiovascular;  Laterality: Left;  SFA with STENT  . PERIPHERAL VASCULAR BALLOON ANGIOPLASTY Left 11/15/2018   Procedure: PERIPHERAL VASCULAR BALLOON ANGIOPLASTY;  Surgeon: [name], MD;  Location: [facility];  Service: Cardiovascular;  Laterality: Left;  PT        Home Medications    Prior to Admission medications   Medication Sig Start Date End Date Taking? Authorizing Provider  acetaminophen (TYLENOL) 500 MG tablet Take 1,000 mg by mouth 3 (three) times daily.     [provider]  Amino Acids-Protein Hydrolys (FEEDING SUPPLEMENT, PRO-STAT SUGAR FREE 64,) LIQD Take 30 mLs by mouth 2 (two) times daily. 9a and 5p    [provider]  atorvastatin (LIPITOR) 20 MG tablet Take 20 mg by mouth daily.    [provider]  B Complex-C-Folic Acid (RENAL VITAMIN PO) Take 1 tablet by mouth at bedtime.  [provider]  calcium acetate (PHOSLO) 667 MG capsule Take 1,334 mg by mouth 3 (three) times daily with meals.    [provider]  Cholecalciferol (VITAMIN D3) 1.25 MG (50000 UT) CAPS Take 1 capsule by mouth once a week.    [provider]  dicyclomine (BENTYL) 20 MG tablet Take 20 mg by mouth 3 (three) times daily before meals.    [provider]  doxercalciferol (HECTOROL) 4 MCG/2ML injection Inject 1 mL (2 mcg total) into the vein every Monday, Wednesday, and Friday with hemodialysis. 12/05/18   Nita Sells, MD  doxycycline (VIBRA-TABS) 100 MG tablet Take 100 mg by mouth 2 (two) times daily. 07/24/19 08/03/19  [provider]  loperamide (IMODIUM A-D) 2 MG tablet Take 4 mg by mouth See admin instructions. Take 2  tablets ( 4 mg) by mouth after 1st loose stool as needed for diarrhea    [provider]  metoprolol tartrate (LOPRESSOR) 25 MG tablet Take 25 mg by mouth See admin instructions. Take 25 mg by mouth two times a day on Sun/Tues/Thurs/Sat and 25 mg at bedtime on Mon/Wed/Fri    [provider]  midodrine (PROAMATINE) 10 MG tablet Take one tablet (10 mg) by mouth on Monday, Wednesday, Friday 30 minutes prior to dialysis.    [provider]  Nutritional Supplements (ADULT NUTRITIONAL SUPPLEMENT PO) Take 1 each by mouth daily. Magic cup with dinner    [provider]  pantoprazole (PROTONIX) 40 MG tablet Take 1 tablet (40 mg total) by mouth daily. 01/01/19   Domenic Polite, MD  warfarin (COUMADIN) 2.5 MG tablet Take 2.5 mg by mouth See admin instructions. Take everyday EXCEPT Mondays and Wednesdays    [provider]  warfarin (COUMADIN) 5 MG tablet Take 5 mg by mouth See admin instructions. Take Mondays and Wednesdays    [provider]    Family History Family History  Problem Relation Age of Onset  . Alzheimer's disease Mother   . Diabetes Father        Amputation:  bilateral legs  . Cancer Daughter        breast cancer  . Diabetes Son   . Heart disease Son        before age 86  . Hypertension Son   . Anesthesia problems Neg Hx     Social History Social History   Tobacco Use  . Smoking status: Former Smoker    Types: Cigarettes    Quit date: 06/30/2004    Years since quitting: 15.0  . Smokeless tobacco: Never Used  Substance Use Topics  . Alcohol use: No  . Drug use: No     Allergies   Occlusive silicone sheets [silicone], Other, and Tape   Review of Systems Review of Systems  All other systems reviewed and are negative.    Physical Exam Updated Vital Signs BP (!) 107/57 (BP Location: Right Arm)   Pulse 86   Temp 98.3 F (36.8 C) (Oral)   Resp 15   Ht 3' (0.914 m)   Wt 54 kg   SpO2 95%   BMI 64.58 kg/m    Physical Exam Vitals signs and nursing note reviewed.  Constitutional:      General: He is not in acute distress.    Appearance: He is well-developed. He is not diaphoretic.  HENT:     Head: Normocephalic and atraumatic.  Neck:     Musculoskeletal: Normal range of motion and neck supple.  Cardiovascular:  Rate and Rhythm: Normal rate and regular rhythm.     Heart sounds: No murmur. No friction rub.  Pulmonary:     Effort: Pulmonary effort is normal. No respiratory distress.     Breath sounds: Normal breath sounds. No wheezing or rales.  Abdominal:     General: Bowel sounds are normal. There is no distension.     Palpations: Abdomen is soft.     Tenderness: There is no abdominal tenderness.  Musculoskeletal: Normal range of motion.     Comments: Patient is status post above-the-knee amputation of both lower legs.  Skin:    General: Skin is warm and dry.  Neurological:     Mental Status: He is alert and oriented to person, place, and time.     Coordination: Coordination normal.      ED Treatments / Results  Labs (all labs ordered are listed, but only abnormal results are displayed) Labs Reviewed  BASIC METABOLIC PANEL  CBC WITH DIFFERENTIAL/PLATELET    EKG None  Radiology No results found.  Procedures Procedures (including critical care time)  Medications Ordered in ED Medications - No data to display   Initial Impression / Assessment and Plan / ED Course  I have reviewed the triage vital signs and the nursing notes.  Pertinent labs & imaging results that were available during my care of the patient were reviewed by me and considered in my medical decision making (see chart for details).  Patient is a 78 year old male brought by EMS for evaluation of low blood pressure.  He had blood pressures of 80 systolic at his extended care facility.  His blood pressures here have been much better with systolic blood pressure over 100.  I suspect that this is not far  from his baseline.  His work-up shows no obvious abnormalities.  Patient was given a gentle bolus of normal saline and appears to be mentating normally and vitals are stable.  At this point, I feel as though the patient is appropriate for discharge.  I see no indication for admission.  Final Clinical Impressions(s) / ED Diagnoses   Final diagnoses:  None    ED Discharge Orders    None       Veryl Speak, MD 07/27/19 [id number]

## 2019-07-27 NOTE — Discharge Instructions (Addendum)
Return to the emergency department if you develop difficulty breathing, chest pain, or other new and concerning symptoms.

## 2019-07-27 NOTE — ED Notes (Signed)
Pt discharged with all belongings. Discharge instructions reviewed with pt, and pt verbalized understanding. Opportunity for questions provided. Pt unable to provide discharge signature.

## 2019-07-31 ENCOUNTER — Non-Acute Institutional Stay: Payer: Self-pay | Admitting: Internal Medicine

## 2019-07-31 ENCOUNTER — Non-Acute Institutional Stay (SKILLED_NURSING_FACILITY): Payer: Medicare Other | Admitting: Internal Medicine

## 2019-07-31 ENCOUNTER — Encounter: Payer: Self-pay | Admitting: Internal Medicine

## 2019-07-31 DIAGNOSIS — L8994 Pressure ulcer of unspecified site, stage 4: Secondary | ICD-10-CM

## 2019-07-31 DIAGNOSIS — Z5181 Encounter for therapeutic drug level monitoring: Secondary | ICD-10-CM

## 2019-07-31 DIAGNOSIS — L89154 Pressure ulcer of sacral region, stage 4: Secondary | ICD-10-CM | POA: Diagnosis not present

## 2019-07-31 DIAGNOSIS — I480 Paroxysmal atrial fibrillation: Secondary | ICD-10-CM

## 2019-07-31 DIAGNOSIS — L089 Local infection of the skin and subcutaneous tissue, unspecified: Secondary | ICD-10-CM

## 2019-07-31 DIAGNOSIS — Z7901 Long term (current) use of anticoagulants: Secondary | ICD-10-CM

## 2019-07-31 MED ORDER — HYDROCODONE-ACETAMINOPHEN 5-325 MG PO TABS: 1.0000 | ORAL_TABLET | Freq: Two times a day (BID) | ORAL | 0 refills | Status: DC

## 2019-07-31 NOTE — Progress Notes (Signed)
Location:  [place] Room Number: 509-P Place of Service:  SNF (31)  Edward Duos, MD  Patient Care Team: Edward Duos, MD as PCP - General (Internal Medicine) [name], MD as Consulting Physician (Nephrology) Rehab, [place] ([place], [facility]  Extended Emergency Contact Information Primary Emergency Contact: [name],[name] Address: [place], [place] [address] [name] of [place] Phone: [phone] Work Phone: [phone] Mobile Phone: [phone] Relation: Daughter Secondary Emergency Contact: [name] Mobile Phone: [phone] Relation: Other    Allergies: Occlusive silicone sheets [silicone], Other, and Tape  Chief Complaint  Patient presents with  . Acute Visit    HPI: Patient is 78 y.o. male who is being seen because of concerns by the wound care nurse.  Patient has had a sacral wound and over the weekend drainage from it soaked through the dressing that she the padded into the bed sheet.  There is reported some mild odor involved.  On examination patient has a obviously 6 x 2 cm area of eschar under which there is bloody and pussy drainage.  Patient's had no fever chills nausea vomiting.  He does admit that it area is tender to palpation.  Past Medical History:  Diagnosis Date  . A-fib ([place])   . Anemia   . Blood transfusion   . BPH (benign prostatic hyperplasia)   . CHF (congestive heart failure) ([place])   . Diarrhea   . DM (diabetes mellitus) ([place])   . ESRD on hemodialysis ([place])    Started dialysis in 2009  . History of GI bleed    secondary to coumadin  . HTN (hypertension)   . Hyperlipidemia   . OSA (obstructive sleep apnea)    uses CPAP  . Secondary hyperparathyroidism of renal origin [facility])     Past Surgical History:  Procedure Laterality Date  . ABDOMINAL AORTOGRAM N/A 11/15/2018   Procedure: ABDOMINAL AORTOGRAM;   Surgeon: [name], MD;  Location: [place] [facility];  Service: Cardiovascular;  Laterality: N/A;  . AMPUTATION Left 12/06/2018   Procedure: AMPUTATION DIGIT LEFT FIFTH TOE;  Surgeon: Angelia Mould, MD;  Location: [place];  Service: Vascular;  Laterality: Left;  . AMPUTATION Bilateral 12/29/2018   Procedure: AMPUTATION ABOVE KNEE;  Surgeon: Waynetta Sandy, MD;  Location: [place];  Service: Vascular;  Laterality: Bilateral;  . APPLICATION OF WOUND VAC Right 02/17/2019   Procedure: Application Of Wound Vac;  Surgeon: Marty Heck, MD;  Location: [place];  Service: Vascular;  Laterality: Right;  . BVT  1/74/94   Left  Basilic Vein Transposition  . CHOLECYSTECTOMY    . EYE SURGERY     Catarct bil  . I&D EXTREMITY Right 02/17/2019   Procedure: Right above the kneee debridement;  Surgeon: Marty Heck, MD;  Location: [place];  Service: Vascular;  Laterality: Right;  . INSERTION OF DIALYSIS CATHETER  05/28/2012   Procedure: INSERTION OF DIALYSIS CATHETER;  Surgeon: Mal Misty, MD;  Location: [place];  Service: Vascular;  Laterality: Right;  . Left arm shuntogram.    . Left forearm loop graft with 6 mm Gore-Tex graft.    . LOWER EXTREMITY ANGIOGRAPHY Bilateral 11/15/2018   Procedure: LOWER EXTREMITY ANGIOGRAPHY;  Surgeon: [name], MD;  Location: [place] [facility];  Service: Cardiovascular;  Laterality: Bilateral;  . Pars plana vitrectomy with 25-gauge system    .  PERIPHERAL VASCULAR ATHERECTOMY Left 11/15/2018   Procedure: PERIPHERAL VASCULAR ATHERECTOMY;  Surgeon: [name], MD;  Location: [place] [facility];  Service: Cardiovascular;  Laterality: Left;  SFA with STENT  . PERIPHERAL VASCULAR BALLOON ANGIOPLASTY Left 11/15/2018   Procedure: PERIPHERAL VASCULAR BALLOON ANGIOPLASTY;  Surgeon: [name], MD;  Location: [place] [facility];  Service: Cardiovascular;  Laterality: Left;  PT    Allergies as of 07/31/2019      Reactions   Occlusive  Silicone Sheets [silicone] Other (See Comments)   "Allergic," per [facility]   Other Other (See Comments)   Unknown reaction to Occlusive adhesive- "Allergic," per [facility]   Tape Itching, Other (See Comments)   Use Cloth tape only, please      Medication List       Accurate as of July 31, 2019 11:59 PM. If you have any questions, ask your nurse or doctor.        acetaminophen 500 MG tablet Commonly known as: TYLENOL Take 1,000 mg by mouth 3 (three) times daily.   ADULT NUTRITIONAL SUPPLEMENT PO Take 1 each by mouth daily. Magic cup with dinner   atorvastatin 20 MG tablet Commonly known as: LIPITOR Take 20 mg by mouth daily.   calcium acetate 667 MG capsule Commonly known as: PHOSLO Take 1,334 mg by mouth 3 (three) times daily with meals.   dicyclomine 20 MG tablet Commonly known as: BENTYL Take 20 mg by mouth 3 (three) times daily before meals.   doxycycline 100 MG tablet Commonly known as: VIBRA-TABS Take 100 mg by mouth 2 (two) times daily. x10 days, ending 08/03/19 What changed: Another medication with the same name was removed. Continue taking this medication, and follow the directions you see here. Changed by: Inocencio Homes, MD   feeding supplement (PRO-STAT SUGAR FREE 64) Liqd Take 30 mLs by mouth 2 (two) times daily. 9a and 5p   Hectorol 4 MCG/2ML injection Generic drug: doxercalciferol Inject 2 mcg into the vein every Monday, Wednesday, and Friday with hemodialysis.   HYDROcodone-acetaminophen 5-325 MG tablet Commonly known as: Norco Take 1 tablet by mouth every 12 (twelve) hours. Started by: Inocencio Homes, MD   loperamide 2 MG tablet Commonly known as: IMODIUM A-D Take 4 mg by mouth See admin instructions. Take 2 tablets ( 4 mg) by mouth after 1st loose stool as needed for diarrhea   metoprolol tartrate 25 MG tablet Commonly known as: LOPRESSOR Take 25 mg by mouth See admin instructions. Take 25 mg by mouth two times a day on Sun/Tues/Thurs/Sat and 25 mg at  bedtime on Mon/Wed/Fri   midodrine 10 MG tablet Commonly known as: PROAMATINE Take one tablet (10 mg) by mouth on Monday, Wednesday, Friday 30 minutes prior to dialysis.   pantoprazole 40 MG tablet Commonly known as: PROTONIX Take 1 tablet (40 mg total) by mouth daily.   RENAL VITAMIN PO Take 1 tablet by mouth at bedtime.   Vitamin D3 1.25 MG (50000 UT) Caps Take 1 capsule by mouth once a week.   warfarin 2.5 MG tablet Commonly known as: COUMADIN Take 2.5 mg by mouth daily. What changed: Another medication with the same name was removed. Continue taking this medication, and follow the directions you see here. Changed by: Inocencio Homes, MD       Meds ordered this encounter  Medications  . HYDROcodone-acetaminophen (NORCO) 5-325 MG tablet    Sig: Take 1 tablet by mouth every 12 (twelve) hours.    Dispense:  60 tablet  Refill:  0     There is no immunization history on file for this patient.  Social History   Tobacco Use  . Smoking status: Former Smoker    Types: Cigarettes    Quit date: 06/30/2004    Years since quitting: 15.1  . Smokeless tobacco: Never Used  Substance Use Topics  . Alcohol use: No    Review of Systems  DATA OBTAINED: from patient, nurse, GENERAL:  no fevers, fatigue, appetite changes SKIN: Draining sacral wound HEENT: No complaint RESPIRATORY: No cough, wheezing, SOB CARDIAC: No chest pain, palpitations, lower extremity edema  GI: No abdominal pain, No N/V/D or constipation, No heartburn or reflux  GU: No dysuria, frequency or urgency, or incontinence  MUSCULOSKELETAL: No unrelieved bone/joint pain NEUROLOGIC: No headache, dizziness  PSYCHIATRIC: No overt anxiety or sadness  Vitals:   07/31/19 1545  BP: 103/62  Pulse: 80  Resp: 19  Temp: (!) 97.4 F (36.3 C)   Body mass index is 63.8 kg/m. Physical Exam  GENERAL APPEARANCE: Alert, more conversant than the other day, No acute distress  SKIN: Sacral decubitus- measures 3 cm x  5.4 cm x 0.5 cm with undermining from 9-11 o'clock of 6 cm and from 1-4 o'clock 9 cm; entire area overlies bone, which fortunately feels very firm RESPIRATORY: Breathing is even, unlabored. Lung sounds are clear   CARDIOVASCULAR: Heart RRR no murmurs, rubs or gallops. No peripheral edema  GASTROINTESTINAL: Abdomen is soft, non-tender, not distended w/ normal bowel sounds.  GENITOURINARY: Bladder non tender, not distended  MUSCULOSKELETAL: Bilateral AKA NEUROLOGIC: Cranial nerves 2-12 grossly intact. Moves all extremities PSYCHIATRIC: Mood and affect appropriate to situation, no behavioral issues  Patient Active Problem List   Diagnosis Date Noted  . 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 End stage renal disease on dialysis due to type 2 diabetes mellitus ([place]) 03/24/2019  . 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 Anemia due to end stage renal disease ([place]) 03/24/2019  . Protein-calorie malnutrition, severe ([place]) 03/24/2019  . Hyperlipidemia associated with type 2 diabetes mellitus ([place]) 02/22/2019  . Amputation stump infection ([place]) 02/15/2019  . Conjunctivitis of left eye 01/04/2019  . 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[place]) 12/30/2018  . Open wound of left foot   . Encounter for monitoring Coumadin therapy   . OSA (obstructive sleep apnea)   . HTN (hypertension)   . ESRD on hemodialysis ([place])   . BPH (benign prostatic hyperplasia)   . Hypotension 12/07/2018  . GERD (gastroesophageal reflux disease) 12/07/2018  . Pressure injury of skin 12/03/2018  . Symptomatic anemia 12/02/2018  . Acute on chronic blood loss anemia 12/02/2018  . 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[place]) 12/02/2018  . Lower GI bleeding 12/02/2018  . Heme positive stool   . Acute on chronic diastolic (congestive) heart failure ([place]) 11/19/2018  . Acute CVA  (cerebrovascular accident) ([place]) 11/19/2018  . Hypoglycemia 11/19/2018  . Peripheral vascular disease due to secondary diabetes ([place]) 11/19/2018  . Dry gangrene ([place]) 11/19/2018  . 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 DNR (do not resuscitate) discussion   . Goals of care, counseling/discussion   . Palliative care by specialist   . Acute hypoxemic respiratory failure ([place]) 11/06/2018  . Volume overload 11/06/2018  . Multifocal pneumonia 11/06/2018  . Acute metabolic encephalopathy 76/19/5093  . Physical deconditioning 11/06/2018  . Acute respiratory  failure with hypoxia and hypercapnia ([place]) 11/04/2018  . CAP (community acquired pneumonia) 11/04/2018  . Pleural effusion, right 11/04/2018  . Increased ammonia level 11/04/2018  . Abnormal CT of the abdomen 11/04/2018  . Atrial fibrillation with RVR ([place]) 11/04/2018  . Sepsis ([place]) 10/21/2018  . Acute encephalopathy 10/21/2018  . Elevated alkaline phosphatase level 10/21/2018  . Chronic anemia 10/21/2018  . Thrombocytopenia ([place]) 10/21/2018  . Pulmonary HTN ([place]) 03/04/2017  . Aortic valve stenosis 03/04/2017  . Hypertension, accelerated with heart disease, without CHF 01/12/2017  . Dyspnea on exertion 01/12/2017  . ESRD (end stage renal disease) ([place]) 06/07/2012  . Hyperlipidemia 04/24/2011  . HYPERTENSION, BENIGN 10/24/2009  . Type 2 diabetes mellitus with hypertension and end stage renal disease on dialysis ([place]) 10/23/2009  . OBSTRUCTIVE SLEEP APNEA 10/23/2009  . PAF (paroxysmal atrial fibrillation) ([place] 10/23/2009  . CHF (congestive heart failure) ([place]) 10/23/2009    CMP     Component Value Date/Time   NA 138 07/27/2019 1529   NA 141 02/27/2019   K 3.5 07/27/2019 1529   CL 95 (L) 07/27/2019 1529   CO2 26 07/27/2019 1529   GLUCOSE 143 (H) 07/27/2019 1529   BUN 48 (H) 07/27/2019 1529   BUN 64 (A) 02/27/2019   CREATININE 4.61 (H) 07/27/2019 1529   CALCIUM 10.2 07/27/2019 1529   CALCIUM 8.3 (L)  10/25/2008 1450   PROT 6.5 05/28/2019 0253   ALBUMIN 2.3 (L) 05/31/2019 1319   AST 31 05/28/2019 0253   ALT 42 05/28/2019 0253   ALKPHOS 261 (H) 05/28/2019 0253   BILITOT 0.6 05/28/2019 0253   GFRNONAA 11 (L) 07/27/2019 1529   GFRAA 13 (L) 07/27/2019 1529   Recent Labs    02/20/19 1300  05/28/19 0253 05/29/19 0541 05/29/19 2316 05/30/19 0727 05/31/19 1319 07/27/19 1529  NA 137   < > 137  --   --  137 136 138  K 3.0*   < > 3.7  --   --  5.1 3.6 3.5  CL 101   < > 102  --   --  101 98 95*  CO2 23   < > 21*  --   --  17* 24 26  GLUCOSE 101*   < > 151*  --   --  99 140* 143*  BUN 27*   < > 80*  --   --  158* 44* 48*  CREATININE 3.41*   < > 6.12*  --   --  8.32* 4.29* 4.61*  CALCIUM 7.7*   < > 9.3  --   --  10.0 9.1 10.2  MG  --    < > 2.0 2.1 2.4  --   --   --   PHOS 2.4*  --   --   --   --  2.6 3.0  --    < > = values in this interval not displayed.   Recent Labs    05/26/19 0214 05/27/19 0348 05/28/19 0253 05/30/19 0727 05/31/19 1319  AST 49* 55* 31  --   --   ALT 44 55* 42  --   --   ALKPHOS 235* 272* 261*  --   --   BILITOT 0.7 0.8 0.6  --   --   PROT 6.8 7.0 6.5  --   --   ALBUMIN 2.4* 2.4* 2.2* 2.3* 2.3*   Recent Labs    02/15/19 1242  05/25/19 1421  05/30/19 0727 05/31/19 1319 07/27/19 1529  WBC 14.7*   < >  17.5*   < > 14.6* 10.3 18.6*  NEUTROABS 11.8*  --  14.2*  --   --   --  15.5*  HGB 10.1*   < > 10.7*   < > 7.4* 7.6* 12.4*  HCT 35.1*   < > 35.1*   < > 23.1* 24.7* 40.2  MCV 90.5   < > 89.1   < > 85.9 88.5 89.3  PLT 227   < > 251   < > 192 220 171   < > = values in this interval not displayed.   Recent Labs    11/11/18 1517  CHOL 113  LDLCALC 62  TRIG 60   No results found for: [facility] Lab Results  Component Value Date   TSH 1.839 05/25/2019   Lab Results  Component Value Date   HGBA1C 4.9 02/16/2019   Lab Results  Component Value Date   CHOL 113 11/11/2018   HDL 39 (L) 11/11/2018   LDLCALC 62 11/11/2018   TRIG 60 11/11/2018    CHOLHDL 2.9 11/11/2018    Significant Diagnostic Results in last 30 days:  Dg Chest Port 1 View  Result Date: 07/27/2019 CLINICAL DATA:  Hypotension in dialysis patient. Pt denies SOB and chest pain. States he had dialysis yesterday. EXAM: PORTABLE CHEST 1 VIEW COMPARISON:  05/25/2019 FINDINGS: Heart size is UPPER normal and accentuated by portable position of the patient. There are faint airspace filling opacities throughout the lungs bilaterally, RIGHT greater than LEFT. No frank consolidations. No pleural effusions. IMPRESSION: Bilateral infiltrates, RIGHT greater than LEFT. Findings favor mild edema. Early RIGHT LOWER lobe infiltrate is not excluded. Electronically Signed   By: Nolon Nations M.D.   On: 07/27/2019 15:54    Assessment and Plan-           Infected sacral wound-    procedure-3 x 6 cm area was opened with a 15 blade to increase the area of drainage and the area was explored with a gloved finger down to 6 cm and an undermining was explored superiorly to further than gloved finger could reach; there was no drainage from the 1 before area of undermining but pus and blood did drain from the 9-11 area where the incision was made; after as much blood and pus could be expressed area was packed to keep the wound open and redressed. Patient is going to need an MRI to rule out osteomyelitis of the sacrum and this is been ordered; patient is going to need antibiotics to treat osteomyelitis until proven otherwise patient is a dialysis patient and I have put the patient on vancomycin and cefepime in conjunction with the dialysis physicians and the dosing will be vancomycin 1 g to load and 500 cc with every dialysis for a minimum of 3 weeks and for cefepime 1 g to load then 1 g with every dialysis for 3 weeks minimum; in addition patient will be needing some pain medication, he currently only has Tylenol and we are going to start Norco 5/325 1 p.o. twice daily and continue Tylenol 1000 mg twice a  day as well   No problem-specific Assessment & Plan notes found for this encounter.   Labs/tests ordered:    Edward Duos, MD

## 2019-08-01 ENCOUNTER — Other Ambulatory Visit: Payer: Self-pay | Admitting: Internal Medicine

## 2019-08-01 ENCOUNTER — Other Ambulatory Visit (HOSPITAL_COMMUNITY): Payer: Self-pay | Admitting: Internal Medicine

## 2019-08-01 ENCOUNTER — Telehealth: Payer: Self-pay | Admitting: Cardiology

## 2019-08-01 ENCOUNTER — Non-Acute Institutional Stay (SKILLED_NURSING_FACILITY): Payer: Medicare Other | Admitting: Internal Medicine

## 2019-08-01 DIAGNOSIS — I480 Paroxysmal atrial fibrillation: Secondary | ICD-10-CM

## 2019-08-01 DIAGNOSIS — Z7901 Long term (current) use of anticoagulants: Secondary | ICD-10-CM

## 2019-08-01 DIAGNOSIS — S31000A Unspecified open wound of lower back and pelvis without penetration into retroperitoneum, initial encounter: Secondary | ICD-10-CM

## 2019-08-01 NOTE — Telephone Encounter (Signed)
Recall - lm

## 2019-08-04 ENCOUNTER — Non-Acute Institutional Stay (SKILLED_NURSING_FACILITY): Payer: Medicare Other | Admitting: Internal Medicine

## 2019-08-04 DIAGNOSIS — I480 Paroxysmal atrial fibrillation: Secondary | ICD-10-CM

## 2019-08-04 DIAGNOSIS — Z5181 Encounter for therapeutic drug level monitoring: Secondary | ICD-10-CM | POA: Diagnosis not present

## 2019-08-04 DIAGNOSIS — Z7901 Long term (current) use of anticoagulants: Secondary | ICD-10-CM | POA: Diagnosis not present

## 2019-08-05 ENCOUNTER — Encounter: Payer: Self-pay | Admitting: Internal Medicine

## 2019-08-05 DIAGNOSIS — L89154 Pressure ulcer of sacral region, stage 4: Secondary | ICD-10-CM | POA: Insufficient documentation

## 2019-08-05 DIAGNOSIS — L089 Local infection of the skin and subcutaneous tissue, unspecified: Secondary | ICD-10-CM | POA: Insufficient documentation

## 2019-08-05 DIAGNOSIS — L8994 Pressure ulcer of unspecified site, stage 4: Secondary | ICD-10-CM | POA: Insufficient documentation

## 2019-08-05 NOTE — Progress Notes (Addendum)
Location:  [place] of Service:     Hennie Duos, MD  Patient Care Team: Hennie Duos, MD as PCP - General (Internal Medicine) [name], MD as Consulting Physician (Nephrology) Rehab, [place] [facility], [facility]  Extended Emergency Contact Information Primary Emergency Contact: [name],[name] Address: [place], [place] [address] [name] of [place] Phone: [phone] Work Phone: [phone] Mobile Phone: [phone] Relation: Daughter Secondary Emergency Contact: [name] Mobile Phone: [phone] Relation: Other    Allergies: Occlusive silicone sheets [silicone], Other, and Tape  Chief Complaint  Patient presents with  . Anticoagulation    HPI: Patient is 78 y.o. male who is being seen for management of his Coumadin.  Patient is on Coumadin for atrial fibrillation.  Patient's INR today is 1.9 and that has been on 2.5 mg daily.  Patient has no complaints and there have been no problems with his Coumadin.  Past Medical History:  Diagnosis Date  . A-fib ([place])   . Anemia   . Blood transfusion   . BPH (benign prostatic hyperplasia)   . CHF (congestive heart failure) ([place])   . Diarrhea   . DM (diabetes mellitus) ([place])   . ESRD on hemodialysis ([place])    Started dialysis in 2009  . History of GI bleed    secondary to coumadin  . HTN (hypertension)   . Hyperlipidemia   . OSA (obstructive sleep apnea)    uses CPAP  . Secondary hyperparathyroidism of renal origin [facility])     Past Surgical History:  Procedure Laterality Date  . ABDOMINAL AORTOGRAM N/A 11/15/2018   Procedure: ABDOMINAL AORTOGRAM;  Surgeon: [name], MD;  Location: [place] [facility];  Service: Cardiovascular;  Laterality: N/A;  . AMPUTATION Left 12/06/2018   Procedure: AMPUTATION DIGIT LEFT FIFTH TOE;  Surgeon: Angelia Mould, MD;  Location: [place];  Service:  Vascular;  Laterality: Left;  . AMPUTATION Bilateral 12/29/2018   Procedure: AMPUTATION ABOVE KNEE;  Surgeon: Waynetta Sandy, MD;  Location: [place];  Service: Vascular;  Laterality: Bilateral;  . APPLICATION OF WOUND VAC Right 02/17/2019   Procedure: Application Of Wound Vac;  Surgeon: Marty Heck, MD;  Location: [place];  Service: Vascular;  Laterality: Right;  . BVT  [id number]   Left  Basilic Vein Transposition  . CHOLECYSTECTOMY    . EYE SURGERY     Catarct bil  . I&D EXTREMITY Right 02/17/2019   Procedure: Right above the kneee debridement;  Surgeon: Marty Heck, MD;  Location: [place];  Service: Vascular;  Laterality: Right;  . INSERTION OF DIALYSIS CATHETER  05/28/2012   Procedure: INSERTION OF DIALYSIS CATHETER;  Surgeon: Mal Misty, MD;  Location: [place];  Service: Vascular;  Laterality: Right;  . Left arm shuntogram.    . Left forearm loop graft with 6 mm Gore-Tex graft.    . LOWER EXTREMITY ANGIOGRAPHY Bilateral 11/15/2018   Procedure: LOWER EXTREMITY ANGIOGRAPHY;  Surgeon: [name], MD;  Location: [place] [facility];  Service: Cardiovascular;  Laterality: Bilateral;  . Pars plana vitrectomy with 25-gauge system    . PERIPHERAL VASCULAR ATHERECTOMY Left 11/15/2018   Procedure: PERIPHERAL VASCULAR ATHERECTOMY;  Surgeon: [name], MD;  Location: [place] [facility];  Service: Cardiovascular;  Laterality: Left;  SFA with STENT  . PERIPHERAL VASCULAR BALLOON ANGIOPLASTY Left 11/15/2018   Procedure:  PERIPHERAL VASCULAR BALLOON ANGIOPLASTY;  Surgeon: [name], MD;  Location: [place] [facility];  Service: Cardiovascular;  Laterality: Left;  PT    Allergies as of 07/31/2019      Reactions   Occlusive Silicone Sheets [silicone] Other (See Comments)   "Allergic," per [facility]   Other Other (See Comments)   Unknown reaction to Occlusive adhesive- "Allergic," per [facility]   Tape Itching, Other (See Comments)   Use Cloth tape only, please      Medication  List       Accurate as of July 31, 2019 11:59 PM. If you have any questions, ask your nurse or doctor.        acetaminophen 500 MG tablet Commonly known as: TYLENOL Take 1,000 mg by mouth 3 (three) times daily.   ADULT NUTRITIONAL SUPPLEMENT PO Take 1 each by mouth daily. Magic cup with dinner   atorvastatin 20 MG tablet Commonly known as: LIPITOR Take 20 mg by mouth daily.   calcium acetate 667 MG capsule Commonly known as: PHOSLO Take 1,334 mg by mouth 3 (three) times daily with meals.   dicyclomine 20 MG tablet Commonly known as: BENTYL Take 20 mg by mouth 3 (three) times daily before meals.   doxycycline 100 MG tablet Commonly known as: VIBRA-TABS Take 100 mg by mouth 2 (two) times daily. x10 days, ending 08/03/19 What changed: Another medication with the same name was removed. Continue taking this medication, and follow the directions you see here. Changed by: Inocencio Homes, MD   feeding supplement (PRO-STAT SUGAR FREE 64) Liqd Take 30 mLs by mouth 2 (two) times daily. 9a and 5p   Hectorol 4 MCG/2ML injection Generic drug: doxercalciferol Inject 2 mcg into the vein every Monday, Wednesday, and Friday with hemodialysis.   HYDROcodone-acetaminophen 5-325 MG tablet Commonly known as: Norco Take 1 tablet by mouth every 12 (twelve) hours. Started by: Inocencio Homes, MD   loperamide 2 MG tablet Commonly known as: IMODIUM A-D Take 4 mg by mouth See admin instructions. Take 2 tablets ( 4 mg) by mouth after 1st loose stool as needed for diarrhea   metoprolol tartrate 25 MG tablet Commonly known as: LOPRESSOR Take 25 mg by mouth See admin instructions. Take 25 mg by mouth two times a day on Sun/Tues/Thurs/Sat and 25 mg at bedtime on Mon/Wed/Fri   midodrine 10 MG tablet Commonly known as: PROAMATINE Take one tablet (10 mg) by mouth on Monday, Wednesday, Friday 30 minutes prior to dialysis.   pantoprazole 40 MG tablet Commonly known as: PROTONIX Take 1 tablet (40 mg  total) by mouth daily.   RENAL VITAMIN PO Take 1 tablet by mouth at bedtime.   Vitamin D3 1.25 MG (50000 UT) Caps Take 1 capsule by mouth once a week.   warfarin 2.5 MG tablet Commonly known as: COUMADIN Take 2.5 mg by mouth daily. What changed: Another medication with the same name was removed. Continue taking this medication, and follow the directions you see here. Changed by: Inocencio Homes, MD       No orders of the defined types were placed in this encounter.    There is no immunization history on file for this patient.  Social History   Tobacco Use  . Smoking status: Former Smoker    Types: Cigarettes    Quit date: 06/30/2004    Years since quitting: 15.1  . Smokeless tobacco: Never Used  Substance Use Topics  . Alcohol use: No     There were no  vitals filed for this visit. There is no height or weight on file to calculate BMI.   Patient Active Problem List   Diagnosis Date Noted  . Sacral decubitus ulcer, stage IV ([place]) 08/05/2019  . Infected decubitus ulcer, stage IV ([place]) 08/05/2019  . 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 End stage renal disease on dialysis due to type 2 diabetes mellitus ([place]) 03/24/2019  . 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 Anemia due to end stage renal disease ([place]) 03/24/2019  . Protein-calorie malnutrition, severe ([place]) 03/24/2019  . Hyperlipidemia associated with type 2 diabetes mellitus ([place]) 02/22/2019  . Amputation stump infection ([place]) 02/15/2019  . Conjunctivitis of left eye 01/04/2019  . 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[place]) 12/30/2018  . Open wound of left foot   . Encounter for monitoring Coumadin therapy   . OSA (obstructive sleep apnea)   . HTN (hypertension)   . ESRD on hemodialysis ([place])   . BPH (benign prostatic hyperplasia)   . Hypotension 12/07/2018  .  GERD (gastroesophageal reflux disease) 12/07/2018  . Pressure injury of skin 12/03/2018  . Symptomatic anemia 12/02/2018  . Acute on chronic blood loss anemia 12/02/2018  . 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 Lower GI bleeding 12/02/2018  . Heme positive stool   . Acute on chronic diastolic (congestive) heart failure ([place]) 11/19/2018  . Acute CVA (cerebrovascular accident) ([place]) 11/19/2018  . Hypoglycemia 11/19/2018  . Peripheral vascular disease due to secondary diabetes ([place]) 11/19/2018  . Dry gangrene ([place]) 11/19/2018  . 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 DNR (do not resuscitate) discussion   . Goals of care, counseling/discussion   . Palliative care by specialist   . Acute hypoxemic respiratory failure ([place]) 11/06/2018  . Volume overload 11/06/2018  . Multifocal pneumonia 11/06/2018  . Acute metabolic encephalopathy [id number]  . Physical deconditioning 11/06/2018  . Acute respiratory failure with hypoxia and hypercapnia ([place]) 11/04/2018  . CAP (community acquired pneumonia) 11/04/2018  . Pleural effusion, right 11/04/2018  . Increased ammonia level 11/04/2018  . Abnormal CT of the abdomen 11/04/2018  . Atrial fibrillation with RVR ( [place]) 11/04/2018  . Sepsis ([place]) 10/21/2018  . Acute encephalopathy 10/21/2018  . Elevated alkaline phosphatase level 10/21/2018  . Chronic anemia 10/21/2018  . Thrombocytopenia ([place]) 10/21/2018  . Pulmonary HTN ([place]) 03/04/2017  . Aortic valve stenosis 03/04/2017  . Hypertension, accelerated with heart disease, without CHF 01/12/2017  . Dyspnea on exertion 01/12/2017  . ESRD (end stage renal disease) ([place]) 06/07/2012  . Hyperlipidemia 04/24/2011  . HYPERTENSION, BENIGN 10/24/2009  . Type 2 diabetes mellitus with hypertension and end stage renal disease on dialysis ([place]) 10/23/2009  . OBSTRUCTIVE SLEEP APNEA 10/23/2009  . PAF (paroxysmal atrial fibrillation) ([place]) 10/23/2009  . CHF (congestive heart  failure) ([place]) 10/23/2009    CMP     Component Value Date/Time   NA 138 07/27/2019 1529   NA 141 02/27/2019   K 3.5 07/27/2019 1529   CL 95 (L) 07/27/2019 1529   CO2 26 07/27/2019 1529   GLUCOSE 143 (H) 07/27/2019 1529   BUN 48 (H) 07/27/2019 1529   BUN 64 (A) 02/27/2019   CREATININE 4.61 (H) 07/27/2019 1529   CALCIUM 10.2 07/27/2019 1529   CALCIUM 8.3 (L) 10/25/2008 1450   PROT 6.5 05/28/2019 0253   ALBUMIN 2.3 (L) 05/31/2019 1319   AST  31 05/28/2019 0253   ALT 42 05/28/2019 0253   ALKPHOS 261 (H) 05/28/2019 0253   BILITOT 0.6 05/28/2019 0253   GFRNONAA 11 (L) 07/27/2019 1529   GFRAA 13 (L) 07/27/2019 1529   Recent Labs    02/20/19 1300  05/28/19 0253 05/29/19 0541 05/29/19 2316 05/30/19 0727 05/31/19 1319 07/27/19 1529  NA 137   < > 137  --   --  137 136 138  K 3.0*   < > 3.7  --   --  5.1 3.6 3.5  CL 101   < > 102  --   --  101 98 95*  CO2 23   < > 21*  --   --  17* 24 26  GLUCOSE 101*   < > 151*  --   --  99 140* 143*  BUN 27*   < > 80*  --   --  158* 44* 48*  CREATININE 3.41*   < > 6.12*  --   --  8.32* 4.29* 4.61*  CALCIUM 7.7*   < > 9.3  --   --  10.0 9.1 10.2  MG  --    < > 2.0 2.1 2.4  --   --   --   PHOS 2.4*  --   --   --   --  2.6 3.0  --    < > = values in this interval not displayed.   Recent Labs    05/26/19 0214 05/27/19 0348 05/28/19 0253 05/30/19 0727 05/31/19 1319  AST 49* 55* 31  --   --   ALT 44 55* 42  --   --   ALKPHOS 235* 272* 261*  --   --   BILITOT 0.7 0.8 0.6  --   --   PROT 6.8 7.0 6.5  --   --   ALBUMIN 2.4* 2.4* 2.2* 2.3* 2.3*   Recent Labs    02/15/19 1242  05/25/19 1421  05/30/19 0727 05/31/19 1319 07/27/19 1529  WBC 14.7*   < > 17.5*   < > 14.6* 10.3 18.6*  NEUTROABS 11.8*  --  14.2*  --   --   --  15.5*  HGB 10.1*   < > 10.7*   < > 7.4* 7.6* 12.4*  HCT 35.1*   < > 35.1*   < > 23.1* 24.7* 40.2  MCV 90.5   < > 89.1   < > 85.9 88.5 89.3  PLT 227   < > 251   < > 192 220 171   < > = values in this interval not  displayed.   Recent Labs    11/11/18 1517  CHOL 113  LDLCALC 62  TRIG 60   No results found for: [facility] Lab Results  Component Value Date   TSH 1.839 05/25/2019   Lab Results  Component Value Date   HGBA1C 4.9 02/16/2019   Lab Results  Component Value Date   CHOL 113 11/11/2018   HDL 39 (L) 11/11/2018   LDLCALC 62 11/11/2018   TRIG 60 11/11/2018   CHOLHDL 2.9 11/11/2018    Significant Diagnostic Results in last 30 days:  Dg Chest Port 1 View  Result Date: 07/27/2019 CLINICAL DATA:  Hypotension in dialysis patient. Pt denies SOB and chest pain. States he had dialysis yesterday. EXAM: PORTABLE CHEST 1 VIEW COMPARISON:  05/25/2019 FINDINGS: Heart size is UPPER normal and accentuated by portable position of the patient. There are faint airspace filling opacities throughout the lungs  bilaterally, RIGHT greater than LEFT. No frank consolidations. No pleural effusions. IMPRESSION: Bilateral infiltrates, RIGHT greater than LEFT. Findings favor mild edema. Early RIGHT LOWER lobe infiltrate is not excluded. Electronically Signed   By: Nolon Nations M.D.   On: 07/27/2019 15:54    Assessment and Plan\  Coumadin management/atrial fibrillation- patient is normally on 2.5 mg daily of Coumadin except for Monday Wednesday when he is on 5 mg; however since patient is beginning to IV antibiotics and changing his Coumadin dose to 2.5 mg daily and will recheck again in 7 days    Hennie Duos , MD  This encounter was created in error - please disregard. This encounter was created in error - please disregard.

## 2019-08-06 ENCOUNTER — Ambulatory Visit (HOSPITAL_COMMUNITY)
Admission: RE | Admit: 2019-08-06 | Discharge: 2019-08-06 | Disposition: A | Discharge status: Home / Self Care | Payer: Medicare Other | Source: Ambulatory Visit | Attending: Internal Medicine | Admitting: Internal Medicine

## 2019-08-06 DIAGNOSIS — S31000A Unspecified open wound of lower back and pelvis without penetration into retroperitoneum, initial encounter: Secondary | ICD-10-CM | POA: Insufficient documentation

## 2019-08-07 ENCOUNTER — Non-Acute Institutional Stay (SKILLED_NURSING_FACILITY): Payer: Medicare Other | Admitting: Internal Medicine

## 2019-08-07 DIAGNOSIS — L0390 Cellulitis, unspecified: Secondary | ICD-10-CM

## 2019-08-07 DIAGNOSIS — A498 Other bacterial infections of unspecified site: Secondary | ICD-10-CM

## 2019-08-07 DIAGNOSIS — Z1612 Extended spectrum beta lactamase (ESBL) resistance: Secondary | ICD-10-CM

## 2019-08-07 DIAGNOSIS — L89154 Pressure ulcer of sacral region, stage 4: Secondary | ICD-10-CM | POA: Diagnosis not present

## 2019-08-07 DIAGNOSIS — B9562 Methicillin resistant Staphylococcus aureus infection as the cause of diseases classified elsewhere: Secondary | ICD-10-CM

## 2019-08-11 ENCOUNTER — Non-Acute Institutional Stay (SKILLED_NURSING_FACILITY): Payer: Medicare Other | Admitting: Internal Medicine

## 2019-08-11 ENCOUNTER — Encounter: Payer: Self-pay | Admitting: Internal Medicine

## 2019-08-11 DIAGNOSIS — I480 Paroxysmal atrial fibrillation: Secondary | ICD-10-CM

## 2019-08-11 DIAGNOSIS — Z5181 Encounter for therapeutic drug level monitoring: Secondary | ICD-10-CM

## 2019-08-11 DIAGNOSIS — Z7901 Long term (current) use of anticoagulants: Secondary | ICD-10-CM

## 2019-08-11 NOTE — Progress Notes (Signed)
Location:  [facility] Room Number: 308-W Place of Service:  SNF (31)  Hennie Duos, MD  Patient Care Team: Hennie Duos, MD as PCP - General (Internal Medicine) [name], MD as Consulting Physician (Nephrology) Rehab, [facility], [facility]  Extended Emergency Contact Information Primary Emergency Contact: [name],[name] Address: [place], [place] [address] [name] Phone: [phone] Work Phone: [phone] Mobile Phone: [phone] Relation: Daughter Secondary Emergency Contact: [name] Mobile Phone: [phone] Relation: Other    Allergies: Occlusive silicone sheets [silicone], Other, and Tape  Chief Complaint  Patient presents with   Acute Visit    Patient is seen for Coumadin management.    HPI: Patient is a 78 y.o. male who who is being seen for management of his Coumadin.  Today patient's INR is 2.7, and he is on 2.5 mg of Coumadin daily.  This represents a change in his normal Coumadin dosing secondary to the fact that he is on 2 IV antibiotics for a large sacral wound infection.  Patient is admitted that his sacrum feels better.  Past Medical History:  Diagnosis Date   A-fib ([place])    Anemia    Blood transfusion    BPH (benign prostatic hyperplasia)    CHF (congestive heart failure) (HCC)    Diarrhea    DM (diabetes mellitus) ([place])    ESRD on hemodialysis ([place])    Started dialysis in 2009   History of GI bleed    secondary to coumadin   HTN (hypertension)    Hyperlipidemia    OSA (obstructive sleep apnea)    uses CPAP   Secondary hyperparathyroidism of renal origin [facility])     Past Surgical History:  Procedure Laterality Date   ABDOMINAL AORTOGRAM N/A 11/15/2018   Procedure: ABDOMINAL AORTOGRAM;  Surgeon: [name], MD;  Location: [facility] CV LAB;  Service: Cardiovascular;   Laterality: N/A;   AMPUTATION Left 12/06/2018   Procedure: AMPUTATION DIGIT LEFT FIFTH TOE;  Surgeon: Angelia Mould, MD;  Location: [facility];  Service: Vascular;  Laterality: Left;   AMPUTATION Bilateral 12/29/2018   Procedure: AMPUTATION ABOVE KNEE;  Surgeon: Waynetta Sandy, MD;  Location: [facility];  Service: Vascular;  Laterality: Bilateral;   APPLICATION OF WOUND VAC Right 02/17/2019   Procedure: Application Of Wound Vac;  Surgeon: Marty Heck, MD;  Location: [facility];  Service: Vascular;  Laterality: Right;   BVT  [id number]   Left  Basilic Vein Transposition   CHOLECYSTECTOMY     EYE SURGERY     Catarct bil   I&D EXTREMITY Right 02/17/2019   Procedure: Right above the kneee debridement;  Surgeon: Marty Heck, MD;  Location: [facility];  Service: Vascular;  Laterality: Right;   INSERTION OF DIALYSIS CATHETER  05/28/2012   Procedure: INSERTION OF DIALYSIS CATHETER;  Surgeon: Mal Misty, MD;  Location[facility];  Service: Vascular;  Laterality: Right;   Left arm shuntogram.     Left forearm loop graft with 6 mm Gore-Tex graft.     LOWER EXTREMITY ANGIOGRAPHY Bilateral 11/15/2018   Procedure: LOWER EXTREMITY ANGIOGRAPHY;  Surgeon: [name], MD;  Location: [facility];  Service: Cardiovascular;  Laterality: Bilateral;   Pars plana vitrectomy with 25-gauge system     PERIPHERAL VASCULAR ATHERECTOMY Left 11/15/2018   Procedure: PERIPHERAL VASCULAR ATHERECTOMY;  Surgeon: Trula Slade,  Butch Penny, MD;  Location: [facility];  Service: Cardiovascular;  Laterality: Left;  SFA with STENT   PERIPHERAL VASCULAR BALLOON ANGIOPLASTY Left 11/15/2018   Procedure: PERIPHERAL VASCULAR BALLOON ANGIOPLASTY;  Surgeon: [name], MD;  Location: [facility];  Service: Cardiovascular;  Laterality: Left;  PT    Allergies as of 08/11/2019      Reactions   Occlusive Silicone Sheets [silicone] Other (See Comments)   "Allergic," per [facility]   Other Other (See  Comments)   Unknown reaction to Occlusive adhesive- "Allergic," per [facility]   Tape Itching, Other (See Comments)   Use Cloth tape only, please      Medication List       Accurate as of August 11, 2019  3:08 PM. If you have any questions, ask your nurse or doctor.        acetaminophen 500 MG tablet Commonly known as: TYLENOL Take 1,000 mg by mouth 2 (two) times daily.   ADULT NUTRITIONAL SUPPLEMENT PO Take 1 each by mouth daily. Magic cup with dinner   atorvastatin 20 MG tablet Commonly known as: LIPITOR Take 20 mg by mouth daily.   calcium acetate 667 MG capsule Commonly known as: PHOSLO Take 1,334 mg by mouth 3 (three) times daily with meals.   ceFEPIme 1 g injection Commonly known as: MAXIPIME   CEFEPIME HCL IV Inject 1,000 mg into the vein. Post dialysis treatment x10   dicyclomine 20 MG tablet Commonly known as: BENTYL Take 20 mg by mouth 3 (three) times daily before meals.   ertapenem 1 g injection Commonly known as: INVANZ Inject 500 g into the muscle daily. Reconstitute 1 gram with 3.2 mL lidocaine 1% - give 1.5 mL (500 mg)   feeding supplement (PRO-STAT SUGAR FREE 64) Liqd Take 30 mLs by mouth 2 (two) times daily. 9a and 5p   Hectorol 4 MCG/2ML injection Generic drug: doxercalciferol Inject 2 mcg into the vein every Monday, Wednesday, and Friday with hemodialysis.   HYDROcodone-acetaminophen 5-325 MG tablet Commonly known as: Norco Take 1 tablet by mouth every 12 (twelve) hours.   loperamide 2 MG tablet Commonly known as: IMODIUM A-D Take 4 mg by mouth See admin instructions. Take 2 tablets ( 4 mg) by mouth after 1st loose stool as needed for diarrhea   metoprolol tartrate 25 MG tablet Commonly known as: LOPRESSOR Take 25 mg by mouth See admin instructions. Take 25 mg by mouth two times a day on Sun/Tues/Thurs/Sat and 25 mg at bedtime on Mon/Wed/Fri   midodrine 10 MG tablet Commonly known as: PROAMATINE Take one tablet (10 mg) by mouth on Monday,  Wednesday, Friday 30 minutes prior to dialysis.   pantoprazole 40 MG tablet Commonly known as: PROTONIX Take 1 tablet (40 mg total) by mouth daily.   RENAL VITAMIN PO Take 1 tablet by mouth at bedtime.   vancomycin 500-5 MG/100ML-% IVPB Commonly known as: VANCOCIN Inject 500 mg into the vein daily. Post dialysis treatment x9   Vitamin D3 1.25 MG (50000 UT) Caps Take 1 capsule by mouth once a week.   warfarin 2.5 MG tablet Commonly known as: COUMADIN Take 2.5 mg by mouth daily.       No orders of the defined types were placed in this encounter.    There is no immunization history on file for this patient.  Social History   Tobacco Use   Smoking status: Former Smoker    Types: Cigarettes    Quit date: 06/30/2004  Years since quitting: 15.1   Smokeless tobacco: Never Used  Substance Use Topics   Alcohol use: No     Vitals:   08/11/19 1453  BP: 120/68  Pulse: 82  Resp: 16  Temp: 97.9 F (36.6 C)  SpO2: 96%   Body mass index is 64.88 kg/m.   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 ([place]) 03/24/2019   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 ([place]) 03/24/2019   Anemia due to end stage renal disease ([place]) 03/24/2019   Protein-calorie malnutrition, severe ([place]) 03/24/2019   Hyperlipidemia associated with type 2 diabetes mellitus ([place]) 02/22/2019   Amputation stump infection ([place]) 02/15/2019   Conjunctivitis of left eye 01/04/2019   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[place]) 12/30/2018   Open wound of left foot    Encounter for monitoring Coumadin therapy    OSA (obstructive sleep apnea)     HTN (hypertension)    ESRD on hemodialysis ([place])    BPH (benign prostatic hyperplasia)    Hypotension 12/07/2018   GERD (gastroesophageal reflux disease) 12/07/2018   Pressure injury of skin 12/03/2018   Symptomatic anemia 12/02/2018   Acute on chronic blood loss anemia 12/02/2018   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[place]) 12/02/2018   Lower GI bleeding 12/02/2018   Heme positive stool    Acute on chronic diastolic (congestive) heart failure ([place]) 11/19/2018   Acute CVA (cerebrovascular accident) ([place]) 11/19/2018   Hypoglycemia 11/19/2018   Peripheral vascular disease due to secondary diabetes ([place]) 11/19/2018   Dry gangrene ([place]) 11/19/2018   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   DNR (do not resuscitate) discussion    Goals of care, counseling/discussion    Palliative care by specialist    Acute hypoxemic respiratory failure ([place]) 11/06/2018   Volume overload 11/06/2018   Multifocal pneumonia XX123456   Acute metabolic encephalopathy XX123456   Physical deconditioning 11/06/2018   Acute respiratory failure with hypoxia and hypercapnia ([place]) 11/04/2018   CAP (community acquired pneumonia) 11/04/2018   Pleural effusion, right 11/04/2018   Increased ammonia level 11/04/2018   Abnormal CT of the abdomen 11/04/2018   Atrial fibrillation with RVR ([place]) 11/04/2018   Sepsis ([place]) 10/21/2018   Acute encephalopathy 10/21/2018   Elevated alkaline phosphatase level 10/21/2018   Chronic anemia 10/21/2018   Thrombocytopenia ([place]) 10/21/2018   Pulmonary HTN ([place]) 03/04/2017   Aortic valve stenosis 03/04/2017   Hypertension, accelerated with heart disease, without CHF 01/12/2017   Dyspnea on exertion 01/12/2017   ESRD (end stage renal disease) ([place]) 06/07/2012   Hyperlipidemia 04/24/2011   HYPERTENSION, BENIGN 10/24/2009   Type 2 diabetes mellitus with hypertension and end stage renal disease on dialysis ([place]) 10/23/2009     OBSTRUCTIVE SLEEP APNEA 10/23/2009   PAF (paroxysmal atrial fibrillation) ([place]) 10/23/2009   CHF (congestive heart failure) ([place]) 10/23/2009    CMP     Component Value Date/Time   NA 138 07/27/2019 1529   NA 143 06/06/2019   K 3.5 07/27/2019 1529   CL 95 (L) 07/27/2019 1529   CO2 26 07/27/2019 1529   GLUCOSE 143 (H) 07/27/2019 1529   BUN 48 (H) 07/27/2019 1529   BUN  44 (A) 06/06/2019   CREATININE 4.61 (H) 07/27/2019 1529   CALCIUM 10.2 07/27/2019 1529   CALCIUM 8.3 (L) 10/25/2008 1450   PROT 6.5 05/28/2019 0253   ALBUMIN 2.3 (L) 05/31/2019 1319   AST 31 05/28/2019 0253   ALT 42 05/28/2019 0253   ALKPHOS 261 (H) 05/28/2019 0253   BILITOT 0.6 05/28/2019 0253   GFRNONAA 11 (L) 07/27/2019 1529   GFRAA 13 (L) 07/27/2019 1529   Recent Labs    02/20/19 1300  05/28/19 0253 05/29/19 0541 05/29/19 2316 05/30/19 0727 05/31/19 1319 06/06/19 07/27/19 1529  NA 137   < > 137  --   --  137 136 143 138  K 3.0*   < > 3.7  --   --  5.1 3.6 3.7 3.5  CL 101   < > 102  --   --  101 98  --  95*  CO2 23   < > 21*  --   --  17* 24  --  26  GLUCOSE 101*   < > 151*  --   --  99 140*  --  143*  BUN 27*   < > 80*  --   --  158* 44* 44* 48*  CREATININE 3.41*   < > 6.12*  --   --  8.32* 4.29* 3.5* 4.61*  CALCIUM 7.7*   < > 9.3  --   --  10.0 9.1  --  10.2  MG  --    < > 2.0 2.1 2.4  --   --   --   --   PHOS 2.4*  --   --   --   --  2.6 3.0  --   --    < > = values in this interval not displayed.   Recent Labs    05/26/19 0214 05/27/19 0348 05/28/19 0253 05/30/19 0727 05/31/19 1319  AST 49* 55* 31  --   --   ALT 44 55* 42  --   --   ALKPHOS 235* 272* 261*  --   --   BILITOT 0.7 0.8 0.6  --   --   PROT 6.8 7.0 6.5  --   --   ALBUMIN 2.4* 2.4* 2.2* 2.3* 2.3*   Recent Labs    05/25/19 1421  05/30/19 0727 05/31/19 1319 06/06/19 07/27/19 1529  WBC 17.5*   < > 14.6* 10.3 11.5 18.6*  NEUTROABS 14.2*  --   --   --  8 15.5*  HGB 10.7*   < > 7.4* 7.6* 9.3* 12.4*  HCT 35.1*   < >  23.1* 24.7* 29* 40.2  MCV 89.1   < > 85.9 88.5  --  89.3  PLT 251   < > 192 220 251 171   < > = values in this interval not displayed.   Recent Labs    11/11/18 1517  CHOL 113  LDLCALC 62  TRIG 60   No results found for: [facility] Lab Results  Component Value Date   TSH 1.839 05/25/2019   Lab Results  Component Value Date   HGBA1C 4.9 02/16/2019   Lab Results  Component Value Date   CHOL 113 11/11/2018   HDL 39 (L) 11/11/2018   LDLCALC 62 11/11/2018   TRIG 60 11/11/2018   CHOLHDL 2.9 11/11/2018    Significant Diagnostic Results in last 30 days:  Mr Sacrum Si Joints Wo Contrast  Result Date: 08/07/2019 CLINICAL DATA:  Stable decubitus. EXAM: MRI SACRUM WITHOUT  CONTRAST TECHNIQUE: Multiplanar, multisequence MR imaging of the sacrum was performed. No intravenous contrast was administered. COMPARISON:  None. FINDINGS: Examination is quite limited due to patient motion despite multiple attempts the patient would not or could not stop moving. There is a sizable sacral wound involving the subcutaneous fat and underlying gluteal muscles. I do not see a discrete drainable soft tissue abscess. No definite MR findings for osteomyelitis. Both hips are normally located. No findings to suggest septic arthritis. Moderate-sized central disc protrusion at L4-5 with mass effect on the thecal sac. Markedly enlarged prostate gland with median lobe hypertrophy and extensive mass effect on the base of the bladder. IMPRESSION: 1. Very limited examination due to patient motion. 2. Sacral wound in the midline and to the right involving the subcutaneous fat and the right gluteus maximus muscle in particular. There is focal cellulitis and myositis but no discrete drainable soft tissue abscess. 3. No definite MR findings to suggest osteomyelitis. 4. Sizable disc protrusion at L4-5. 5. Enlarged prostate gland with median lobe hypertrophy and mass effect on the bladder. Electronically Signed   By: Marijo Sanes  M.D.   On: 08/07/2019 08:43   Dg Chest Port 1 View  Result Date: 07/27/2019 CLINICAL DATA:  Hypotension in dialysis patient. Pt denies SOB and chest pain. States he had dialysis yesterday. EXAM: PORTABLE CHEST 1 VIEW COMPARISON:  05/25/2019 FINDINGS: Heart size is UPPER normal and accentuated by portable position of the patient. There are faint airspace filling opacities throughout the lungs bilaterally, RIGHT greater than LEFT. No frank consolidations. No pleural effusions. IMPRESSION: Bilateral infiltrates, RIGHT greater than LEFT. Findings favor mild edema. Early RIGHT LOWER lobe infiltrate is not excluded. Electronically Signed   By: Nolon Nations M.D.   On: 07/27/2019 15:54    Assessment and Plan  Atrial fibrillation/chronic Coumadin use- patient's INR today is 2.7 on 2.5 mg daily, his INR on 8 3 was 1.9.  We will continue Coumadin 2.5 mg daily but will check INR again on Monday 817 to be sure the patient is not going out secondary to his being on 2 IV antibiotics.     Hennie Duos , MD

## 2019-08-12 ENCOUNTER — Encounter: Payer: Self-pay | Admitting: Internal Medicine

## 2019-08-12 DIAGNOSIS — B9562 Methicillin resistant Staphylococcus aureus infection as the cause of diseases classified elsewhere: Secondary | ICD-10-CM | POA: Insufficient documentation

## 2019-08-12 DIAGNOSIS — A498 Other bacterial infections of unspecified site: Secondary | ICD-10-CM | POA: Insufficient documentation

## 2019-08-12 DIAGNOSIS — Z1612 Extended spectrum beta lactamase (ESBL) resistance: Secondary | ICD-10-CM | POA: Insufficient documentation

## 2019-08-12 NOTE — Progress Notes (Signed)
Location:  [facility] of Service:   SNF  Hennie Duos, MD  Patient Care Team: Hennie Duos, MD as PCP - General (Internal Medicine) [name], MD as Consulting Physician (Nephrology) Rehab, [facility], [facility]  Extended Emergency Contact Information Primary Emergency Contact: [name],[name] Address: [place], [place] [address] [name] of [place] Phone: [phone] Work Phone: [phone] Mobile Phone: [phone] Relation: Daughter Secondary Emergency Contact: [name] Mobile Phone: [phone] Relation: Other    Allergies: Occlusive silicone sheets [silicone], Other, and Tape  Chief Complaint  Patient presents with   Acute Visit    HPI: Patient is 78 y.o. male who is being seen today because his wound cultures returned.  His wounds grew out ESBL E. coli heavy growth and MRSA moderate growth.  The day that the area was I indeed culture sent and MRI was ordered and that MRI was done this morning.  Patient complains of pain to the area, but there has been no reported fever.  Past Medical History:  Diagnosis Date   A-fib ([place])    Anemia    Blood transfusion    BPH (benign prostatic hyperplasia)    CHF (congestive heart failure) (HCC)    Diarrhea    DM (diabetes mellitus) ([place])    ESRD on hemodialysis ([place])    Started dialysis in 2009   History of GI bleed    secondary to coumadin   HTN (hypertension)    Hyperlipidemia    OSA (obstructive sleep apnea)    uses CPAP   Secondary hyperparathyroidism of renal origin [facility])     Past Surgical History:  Procedure Laterality Date   ABDOMINAL AORTOGRAM N/A 11/15/2018   Procedure: ABDOMINAL AORTOGRAM;  Surgeon: [name], MD;  Location: [place] CV LAB;  Service: Cardiovascular;  Laterality: N/A;   AMPUTATION Left 12/06/2018   Procedure: AMPUTATION DIGIT LEFT  FIFTH TOE;  Surgeon: Angelia Mould, MD;  Location: [facility];  Service: Vascular;  Laterality: Left;   AMPUTATION Bilateral 12/29/2018   Procedure: AMPUTATION ABOVE KNEE;  Surgeon: Waynetta Sandy, MD;  Location: [place];  Service: Vascular;  Laterality: Bilateral;   APPLICATION OF WOUND VAC Right 02/17/2019   Procedure: Application Of Wound Vac;  Surgeon: Marty Heck, MD;  Location: [facility] [place];  Service: Vascular;  Laterality: Right;   BVT  [id number]   Left  Basilic Vein Transposition   CHOLECYSTECTOMY     EYE SURGERY     Catarct bil   I&D EXTREMITY Right 02/17/2019   Procedure: Right above the kneee debridement;  Surgeon: Marty Heck, MD;  Location: [place];  Service: Vascular;  Laterality: Right;   INSERTION OF DIALYSIS CATHETER  05/28/2012   Procedure: INSERTION OF DIALYSIS CATHETER;  Surgeon: Mal Misty, MD;  Location: [facility] OR;  Service: Vascular;  Laterality: Right;   Left arm shuntogram.     Left forearm loop graft with 6 mm Gore-Tex graft.     LOWER EXTREMITY ANGIOGRAPHY Bilateral 11/15/2018   Procedure: LOWER EXTREMITY ANGIOGRAPHY;  Surgeon: [name], MD;  Location: [place] [facility];  Service: Cardiovascular;  Laterality: Bilateral;   Pars plana vitrectomy with 25-gauge system     PERIPHERAL VASCULAR ATHERECTOMY Left 11/15/2018   Procedure: PERIPHERAL VASCULAR ATHERECTOMY;  Surgeon: [name], MD;  Location: [facility] [facility];  Service: Cardiovascular;  Laterality: Left;  SFA with STENT   PERIPHERAL VASCULAR BALLOON ANGIOPLASTY Left 11/15/2018   Procedure: PERIPHERAL VASCULAR BALLOON ANGIOPLASTY;  Surgeon: [name], MD;  Location: [facility] [facility];  Service: Cardiovascular;  Laterality: Left;  PT    Allergies as of 08/07/2019      Reactions   Occlusive Silicone Sheets [silicone] Other (See Comments)   "Allergic," per [facility]   Other Other (See Comments)   Unknown reaction to Occlusive adhesive- "Allergic," per [facility]   Tape  Itching, Other (See Comments)   Use Cloth tape only, please      Medication List       Accurate as of August 07, 2019 11:59 PM. If you have any questions, ask your nurse or doctor.        acetaminophen 500 MG tablet Commonly known as: TYLENOL Take 1,000 mg by mouth 2 (two) times daily.   ADULT NUTRITIONAL SUPPLEMENT PO Take 1 each by mouth daily. Magic cup with dinner   atorvastatin 20 MG tablet Commonly known as: LIPITOR Take 20 mg by mouth daily.   calcium acetate 667 MG capsule Commonly known as: PHOSLO Take 1,334 mg by mouth 3 (three) times daily with meals.   dicyclomine 20 MG tablet Commonly known as: BENTYL Take 20 mg by mouth 3 (three) times daily before meals.   feeding supplement (PRO-STAT SUGAR FREE 64) Liqd Take 30 mLs by mouth 2 (two) times daily. 9a and 5p   Hectorol 4 MCG/2ML injection Generic drug: doxercalciferol Inject 2 mcg into the vein every Monday, Wednesday, and Friday with hemodialysis.   HYDROcodone-acetaminophen 5-325 MG tablet Commonly known as: Norco Take 1 tablet by mouth every 12 (twelve) hours.   loperamide 2 MG tablet Commonly known as: IMODIUM A-D Take 4 mg by mouth See admin instructions. Take 2 tablets ( 4 mg) by mouth after 1st loose stool as needed for diarrhea   metoprolol tartrate 25 MG tablet Commonly known as: LOPRESSOR Take 25 mg by mouth See admin instructions. Take 25 mg by mouth two times a day on Sun/Tues/Thurs/Sat and 25 mg at bedtime on Mon/Wed/Fri   midodrine 10 MG tablet Commonly known as: PROAMATINE Take one tablet (10 mg) by mouth on Monday, Wednesday, Friday 30 minutes prior to dialysis.   pantoprazole 40 MG tablet Commonly known as: PROTONIX Take 1 tablet (40 mg total) by mouth daily.   RENAL VITAMIN PO Take 1 tablet by mouth at bedtime.   Vitamin D3 1.25 MG (50000 UT) Caps Take 1 capsule by mouth once a week.   warfarin 2.5 MG tablet Commonly known as: COUMADIN Take 2.5 mg by mouth daily.        No orders of the defined types were placed in this encounter.    There is no immunization history on file for this patient.  Social History   Tobacco Use   Smoking status: Former Smoker    Types: Cigarettes    Quit date: 06/30/2004    Years since quitting: 15.1   Smokeless tobacco: Never Used  Substance Use Topics   Alcohol use: No    Review of Systems  DATA OBTAINED: from patient, nurse GENERAL:  no fevers, fatigue, appetite changes SKIN: No itching, rash; pain to sacral area, per nursing drainage has decreased dramatically HEENT: No complaint RESPIRATORY: No cough, wheezing, SOB CARDIAC: No chest pain, palpitations, lower extremity edema  GI: No abdominal pain, No N/V/D or constipation, No heartburn or reflux  GU: No dysuria, frequency or urgency, or  incontinence  MUSCULOSKELETAL: No unrelieved bone/joint pain NEUROLOGIC: No headache, dizziness  PSYCHIATRIC: No overt anxiety or sadness  Vitals:   08/12/19 2115  BP: 120/68  Pulse: 82  Resp: 16  Temp: 97.9 F (36.6 C)   Body mass index is 64.88 kg/m. Physical Exam  GENERAL APPEARANCE: Alert, minimally conversant, No acute distress  SKIN: No diaphoresis rash; wound dressed not visualized this visit HEENT: Unremarkable RESPIRATORY: Breathing is even, unlabored. Lung sounds are clear   CARDIOVASCULAR: Heart RRR no murmurs, rubs or gallops. No peripheral edema  GASTROINTESTINAL: Abdomen is soft, non-tender, not distended w/ normal bowel sounds.  GENITOURINARY: Bladder non tender, not distended  MUSCULOSKELETAL:  NEUROLOGIC: Cranial nerves 2-12 grossly intact. 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 ([place]) 03/24/2019   Anemia due to end stage renal disease ([place]) 03/24/2019   Protein-calorie malnutrition, severe ([place]) 03/24/2019   Hyperlipidemia associated with type 2 diabetes mellitus ([place]) 02/22/2019   Amputation stump infection ([place]) 02/15/2019   Conjunctivitis of left eye 01/04/2019   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[place]) 12/30/2018   Open wound of left foot    Encounter for monitoring Coumadin therapy    OSA (obstructive sleep apnea)    HTN (hypertension)    ESRD on hemodialysis ([place])    BPH (benign prostatic hyperplasia)    Hypotension 12/07/2018   GERD (gastroesophageal reflux disease) 12/07/2018   Pressure injury of skin 12/03/2018   Symptomatic anemia 12/02/2018   Acute on chronic blood loss anemia 12/02/2018   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[place]) 12/02/2018   Lower GI bleeding 12/02/2018   Heme positive stool    Acute on chronic diastolic (congestive) heart failure ([place]) 11/19/2018   Acute CVA (cerebrovascular accident) ([place]) 11/19/2018   Hypoglycemia 11/19/2018   Peripheral vascular disease due to secondary diabetes ([place]) 11/19/2018   Dry gangrene ([place]) 11/19/2018   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   DNR (do not resuscitate) discussion    Goals of care, counseling/discussion    Palliative care by specialist    Acute hypoxemic respiratory failure ([place]) 11/06/2018   Volume overload 11/06/2018   Multifocal pneumonia [id number]   Acute metabolic encephalopathy XX123456   Physical deconditioning 11/06/2018   Acute respiratory failure with hypoxia and hypercapnia ([place]) 11/04/2018   CAP (community acquired pneumonia) 11/04/2018   Pleural effusion, right 11/04/2018   Increased ammonia level 11/04/2018   Abnormal CT of  the abdomen 11/04/2018   Atrial fibrillation with RVR ([place]) 11/04/2018   Sepsis ([place]) 10/21/2018   Acute encephalopathy 10/21/2018   Elevated alkaline phosphatase level 10/21/2018   Chronic anemia 10/21/2018   Thrombocytopenia ([place]) 10/21/2018   Pulmonary HTN ([place]) 03/04/2017   Aortic valve stenosis 03/04/2017   Hypertension, accelerated with heart disease, without CHF 01/12/2017   Dyspnea on exertion 01/12/2017   ESRD (end stage renal disease) ([place]) 06/07/2012   Hyperlipidemia 04/24/2011   HYPERTENSION, BENIGN 10/24/2009   Type 2 diabetes mellitus with hypertension and end stage renal disease on dialysis ([place]) 10/23/2009   OBSTRUCTIVE SLEEP  APNEA 10/23/2009   PAF (paroxysmal atrial fibrillation) ([place]) 10/23/2009   CHF (congestive heart failure) ([place]) 10/23/2009    CMP     Component Value Date/Time   NA 138 07/27/2019 1529   NA 143 06/06/2019   K 3.5 07/27/2019 1529   CL 95 (L) 07/27/2019 1529   CO2 26 07/27/2019 1529   GLUCOSE 143 (H) 07/27/2019 1529   BUN 48 (H) 07/27/2019 1529   BUN 44 (A) 06/06/2019   CREATININE 4.61 (H) 07/27/2019 1529   CALCIUM 10.2 07/27/2019 1529   CALCIUM 8.3 (L) 10/25/2008 1450   PROT 6.5 05/28/2019 0253   ALBUMIN 2.3 (L) 05/31/2019 1319   AST 31 05/28/2019 0253   ALT 42 05/28/2019 0253   ALKPHOS 261 (H) 05/28/2019 0253   BILITOT 0.6 05/28/2019 0253   GFRNONAA 11 (L) 07/27/2019 1529   GFRAA 13 (L) 07/27/2019 1529   Recent Labs    02/20/19 1300  05/28/19 0253 05/29/19 0541 05/29/19 2316 05/30/19 0727 05/31/19 1319 06/06/19 07/27/19 1529  NA 137   < > 137  --   --  137 136 143 138  K 3.0*   < > 3.7  --   --  5.1 3.6 3.7 3.5  CL 101   < > 102  --   --  101 98  --  95*  CO2 23   < > 21*  --   --  17* 24  --  26  GLUCOSE 101*   < > 151*  --   --  99 140*  --  143*  BUN 27*   < > 80*  --   --  158* 44* 44* 48*  CREATININE 3.41*   < > 6.12*  --   --  8.32* 4.29* 3.5* 4.61*  CALCIUM 7.7*   < > 9.3  --   --  10.0 9.1  --   10.2  MG  --    < > 2.0 2.1 2.4  --   --   --   --   PHOS 2.4*  --   --   --   --  2.6 3.0  --   --    < > = values in this interval not displayed.   Recent Labs    05/26/19 0214 05/27/19 0348 05/28/19 0253 05/30/19 0727 05/31/19 1319  AST 49* 55* 31  --   --   ALT 44 55* 42  --   --   ALKPHOS 235* 272* 261*  --   --   BILITOT 0.7 0.8 0.6  --   --   PROT 6.8 7.0 6.5  --   --   ALBUMIN 2.4* 2.4* 2.2* 2.3* 2.3*   Recent Labs    05/25/19 1421  05/30/19 0727 05/31/19 1319 06/06/19 07/27/19 1529  WBC 17.5*   < > 14.6* 10.3 11.5 18.6*  NEUTROABS 14.2*  --   --   --  8 15.5*  HGB 10.7*   < > 7.4* 7.6* 9.3* 12.4*  HCT 35.1*   < > 23.1* 24.7* 29* 40.2  MCV 89.1   < > 85.9 88.5  --  89.3  PLT 251   < > 192 220 251 171   < > = values in this interval not displayed.   Recent Labs    11/11/18 1517  CHOL 113  LDLCALC 62  TRIG 60   No results found for: [facility] Lab Results  Component Value Date   TSH 1.839 05/25/2019  Lab Results  Component Value Date   HGBA1C 4.9 02/16/2019   Lab Results  Component Value Date   CHOL 113 11/11/2018   HDL 39 (L) 11/11/2018   LDLCALC 62 11/11/2018   TRIG 60 11/11/2018   CHOLHDL 2.9 11/11/2018    Significant Diagnostic Results in last 30 days:  Mr Sacrum Si Joints Wo Contrast  Result Date: 08/07/2019 CLINICAL DATA:  Stable decubitus. EXAM: MRI SACRUM WITHOUT CONTRAST TECHNIQUE: Multiplanar, multisequence MR imaging of the sacrum was performed. No intravenous contrast was administered. COMPARISON:  None. FINDINGS: Examination is quite limited due to patient motion despite multiple attempts the patient would not or could not stop moving. There is a sizable sacral wound involving the subcutaneous fat and underlying gluteal muscles. I do not see a discrete drainable soft tissue abscess. No definite MR findings for osteomyelitis. Both hips are normally located. No findings to suggest septic arthritis. Moderate-sized central disc protrusion  at L4-5 with mass effect on the thecal sac. Markedly enlarged prostate gland with median lobe hypertrophy and extensive mass effect on the base of the bladder. IMPRESSION: 1. Very limited examination due to patient motion. 2. Sacral wound in the midline and to the right involving the subcutaneous fat and the right gluteus maximus muscle in particular. There is focal cellulitis and myositis but no discrete drainable soft tissue abscess. 3. No definite MR findings to suggest osteomyelitis. 4. Sizable disc protrusion at L4-5. 5. Enlarged prostate gland with median lobe hypertrophy and mass effect on the bladder. Electronically Signed   By: Marijo Sanes M.D.   On: 08/07/2019 08:43   Dg Chest Port 1 View  Result Date: 07/27/2019 CLINICAL DATA:  Hypotension in dialysis patient. Pt denies SOB and chest pain. States he had dialysis yesterday. EXAM: PORTABLE CHEST 1 VIEW COMPARISON:  05/25/2019 FINDINGS: Heart size is UPPER normal and accentuated by portable position of the patient. There are faint airspace filling opacities throughout the lungs bilaterally, RIGHT greater than LEFT. No frank consolidations. No pleural effusions. IMPRESSION: Bilateral infiltrates, RIGHT greater than LEFT. Findings favor mild edema. Early RIGHT LOWER lobe infiltrate is not excluded. Electronically Signed   By: Nolon Nations M.D.   On: 07/27/2019 15:54    Assessment and Plan  Sacral decubitus/ESBL E. coli infection/MRSA infection- there were several complications involved in the situation.  One is a patient is a dialysis patient and 2 is that the 2 microorganisms in question are not sensitive to the same antibiotics therefore 2 antibiotics will be required.  Initially it was thought that both could be covered with Bactrim, however Bactrim causes hyperkalemia and the nephrologist were against using it so we will continue to use vancomycin which has been being used 1 g every dialysis days since the wound was I&D and will stop the  cefepime.  And will start ertapenem.  The dose of ertapenem is going to be 500 mg IV pending approval by nephrologist.  I have spoken to [place] at the dialysis center several times already today.  I have also spoken to Dr. Drucilla Schmidt, infectious disease several times today.  When the IV interventionalists came out to put the line in Mr. [place], a PICC line, patient was not there but we were informed that in this scenario the nephrologist as for a small bore IJ so a small bore IJ was ordered and placed. His MRI returned showing that it was limited secondary to patient motion but there was no definite signs of osteomyelitis.  Based on this finding  Dr. Drucilla Schmidt feels like the patient may only need 2 weeks of vancomycin and ertapenem.  Pharmacy is to dose; will monitor closely.  Patient is currently in the COVID unit Secondary to having been in the hospital and coming back to the SNF and all visits require full PPE.    Hennie Duos, MD

## 2019-08-13 ENCOUNTER — Encounter: Payer: Self-pay | Admitting: Internal Medicine

## 2019-08-13 NOTE — Progress Notes (Signed)
Location:   [facility] clerk of Service:   [place]  Hennie Duos, MD  Patient Care Team: Hennie Duos, MD as PCP - General (Internal Medicine) [name], MD as Consulting Physician (Nephrology) Rehab, [place] ([place], [facility]  Extended Emergency Contact Information Primary Emergency Contact: [name],[name] Address: [place], [place] [address] [name] of [place] Phone: [phone] Work Phone: [phone] Mobile Phone: [phone] Relation: Daughter Secondary Emergency Contact: [name] Mobile Phone: [phone] Relation: Other    Allergies: Occlusive silicone sheets [silicone], Other, and Tape  Chief Complaint  Patient presents with  . Acute Visit    HPI: Patient is 78 y.o. male who is being seen for routine INR check.  Today patient's INR is 2.2 and he is on 2.2 mg daily secondary to being on IV antibiotics-patient was on 2.5 mg daily with 5 mg on Monday and Wednesday.  Patient has no complaints.  Past Medical History:  Diagnosis Date  . A-fib ([place])   . Anemia   . Blood transfusion   . BPH (benign prostatic hyperplasia)   . CHF (congestive heart failure) ([place])   . Diarrhea   . DM (diabetes mellitus) ([place])   . ESRD on hemodialysis ([place])    Started dialysis in 2009  . History of GI bleed    secondary to coumadin  . HTN (hypertension)   . Hyperlipidemia   . OSA (obstructive sleep apnea)    uses CPAP  . Secondary hyperparathyroidism of renal origin [facility])     Past Surgical History:  Procedure Laterality Date  . ABDOMINAL AORTOGRAM N/A 11/15/2018   Procedure: ABDOMINAL AORTOGRAM;  Surgeon: [name], MD;  Location: [place] [facility];  Service: Cardiovascular;  Laterality: N/A;  . AMPUTATION Left 12/06/2018   Procedure: AMPUTATION DIGIT LEFT FIFTH TOE;  Surgeon: Angelia Mould, MD;  Location: [place];  Service: Vascular;   Laterality: Left;  . AMPUTATION Bilateral 12/29/2018   Procedure: AMPUTATION ABOVE KNEE;  Surgeon: Waynetta Sandy, MD;  Location: [place];  Service: Vascular;  Laterality: Bilateral;  . APPLICATION OF WOUND VAC Right 02/17/2019   Procedure: Application Of Wound Vac;  Surgeon: Marty Heck, MD;  Location: [place];  Service: Vascular;  Laterality: Right;  . BVT  [id number]   Left  Basilic Vein Transposition  . CHOLECYSTECTOMY    . EYE SURGERY     Catarct bil  . I&D EXTREMITY Right 02/17/2019   Procedure: Right above the kneee debridement;  Surgeon: Marty Heck, MD;  Location: [place];  Service: Vascular;  Laterality: Right;  . INSERTION OF DIALYSIS CATHETER  05/28/2012   Procedure: INSERTION OF DIALYSIS CATHETER;  Surgeon: Mal Misty, MD;  Location: [place];  Service: Vascular;  Laterality: Right;  . Left arm shuntogram.    . Left forearm loop graft with 6 mm Gore-Tex graft.    . LOWER EXTREMITY ANGIOGRAPHY Bilateral 11/15/2018   Procedure: LOWER EXTREMITY ANGIOGRAPHY;  Surgeon: [name], MD;  Location: [place] [facility];  Service: Cardiovascular;  Laterality: Bilateral;  . Pars plana vitrectomy with 25-gauge system    . PERIPHERAL VASCULAR ATHERECTOMY Left 11/15/2018   Procedure: PERIPHERAL VASCULAR ATHERECTOMY;  Surgeon: [name], MD;  Location: [place] [facility];  Service: Cardiovascular;  Laterality: Left;  SFA with STENT  . PERIPHERAL VASCULAR BALLOON ANGIOPLASTY Left 11/15/2018   Procedure:  PERIPHERAL VASCULAR BALLOON ANGIOPLASTY;  Surgeon: [name], MD;  Location: [place] [facility];  Service: Cardiovascular;  Laterality: Left;  PT    Allergies as of 08/04/2019      Reactions   Occlusive Silicone Sheets [silicone] Other (See Comments)   "Allergic," per [facility]   Other Other (See Comments)   Unknown reaction to Occlusive adhesive- "Allergic," per [facility]   Tape Itching, Other (See Comments)   Use Cloth tape only, please      Medication List        Accurate as of August 04, 2019 11:59 PM. If you have any questions, ask your nurse or doctor.        acetaminophen 500 MG tablet Commonly known as: TYLENOL Take 1,000 mg by mouth 2 (two) times daily.   ADULT NUTRITIONAL SUPPLEMENT PO Take 1 each by mouth daily. Magic cup with dinner   atorvastatin 20 MG tablet Commonly known as: LIPITOR Take 20 mg by mouth daily.   calcium acetate 667 MG capsule Commonly known as: PHOSLO Take 1,334 mg by mouth 3 (three) times daily with meals.   dicyclomine 20 MG tablet Commonly known as: BENTYL Take 20 mg by mouth 3 (three) times daily before meals.   feeding supplement (PRO-STAT SUGAR FREE 64) Liqd Take 30 mLs by mouth 2 (two) times daily. 9a and 5p   Hectorol 4 MCG/2ML injection Generic drug: doxercalciferol Inject 2 mcg into the vein every Monday, Wednesday, and Friday with hemodialysis.   HYDROcodone-acetaminophen 5-325 MG tablet Commonly known as: Norco Take 1 tablet by mouth every 12 (twelve) hours.   loperamide 2 MG tablet Commonly known as: IMODIUM A-D Take 4 mg by mouth See admin instructions. Take 2 tablets ( 4 mg) by mouth after 1st loose stool as needed for diarrhea   metoprolol tartrate 25 MG tablet Commonly known as: LOPRESSOR Take 25 mg by mouth See admin instructions. Take 25 mg by mouth two times a day on Sun/Tues/Thurs/Sat and 25 mg at bedtime on Mon/Wed/Fri   midodrine 10 MG tablet Commonly known as: PROAMATINE Take one tablet (10 mg) by mouth on Monday, Wednesday, Friday 30 minutes prior to dialysis.   pantoprazole 40 MG tablet Commonly known as: PROTONIX Take 1 tablet (40 mg total) by mouth daily.   RENAL VITAMIN PO Take 1 tablet by mouth at bedtime.   Vitamin D3 1.25 MG (50000 UT) Caps Take 1 capsule by mouth once a week.   warfarin 2.5 MG tablet Commonly known as: COUMADIN Take 2.5 mg by mouth daily.       No orders of the defined types were placed in this encounter.    There is no  immunization history on file for this patient.  Social History   Tobacco Use  . Smoking status: Former Smoker    Types: Cigarettes    Quit date: 06/30/2004    Years since quitting: 15.1  . Smokeless tobacco: Never Used  Substance Use Topics  . Alcohol use: No     Vitals:   08/13/19 1407  BP: 120/68  Pulse: 83  Resp: 16  Temp: 97.9 F (36.6 C)   There is no height or weight on file to calculate BMI.   Patient Active Problem List   Diagnosis Date Noted  . Infection due to ESBL-producing Escherichia coli 08/12/2019  . MRSA cellulitis 08/12/2019  . Sacral decubitus ulcer, stage IV ([place]) 08/05/2019  . Infected decubitus ulcer, stage IV ([place]) 08/05/2019  . Hypertensive heart and kidney disease  with chronic diastolic congestive heart failure and stage 5 chronic kidney disease on chronic dialysis ([place]) 03/24/2019  . End stage renal disease on dialysis due to type 2 diabetes mellitus ([place]) 03/24/2019  . 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 Anemia due to end stage renal disease ([place]) 03/24/2019  . Protein-calorie malnutrition, severe ([place]) 03/24/2019  . Hyperlipidemia associated with type 2 diabetes mellitus ([place]) 02/22/2019  . Amputation stump infection ([place]) 02/15/2019  . Conjunctivitis of left eye 01/04/2019  . 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[place]) 12/30/2018  . Open wound of left foot   . Encounter for monitoring Coumadin therapy   . OSA (obstructive sleep apnea)   . HTN (hypertension)   . ESRD on hemodialysis ([place])   . BPH (benign prostatic hyperplasia)   . Hypotension 12/07/2018  . GERD (gastroesophageal reflux disease) 12/07/2018  . Pressure injury of skin 12/03/2018  . Symptomatic anemia 12/02/2018  . Acute on chronic blood loss anemia 12/02/2018  . 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[place]) 12/02/2018  . Lower GI bleeding 12/02/2018  . Heme positive stool   . Acute on chronic diastolic (congestive) heart failure ([place]) 11/19/2018   . Acute CVA (cerebrovascular accident) ([place]) 11/19/2018  . Hypoglycemia 11/19/2018  . Peripheral vascular disease due to secondary diabetes ([place]) 11/19/2018  . Dry gangrene ([place]) 11/19/2018  . 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 DNR (do not resuscitate) discussion   . Goals of care, counseling/discussion   . Palliative care by specialist   . Acute hypoxemic respiratory failure ([place]) 11/06/2018  . Volume overload 11/06/2018  . Multifocal pneumonia 11/06/2018  . Acute metabolic encephalopathy [id number]  . Physical deconditioning 11/06/2018  . Acute respiratory failure with hypoxia and hypercapnia ([place]) 11/04/2018  . CAP (community acquired pneumonia) 11/04/2018  . Pleural effusion, right 11/04/2018  . Increased ammonia level 11/04/2018  . Abnormal CT of the abdomen 11/04/2018  . Atrial fibrillation with RVR ([place]) 11/04/2018  . Sepsis ([place]) 10/21/2018  . Acute encephalopathy 10/21/2018  . Elevated alkaline phosphatase level 10/21/2018  . Chronic anemia 10/21/2018  . Thrombocytopenia ([place]) 10/21/2018  . Pulmonary HTN ([place]) 03/04/2017  . Aortic valve stenosis 03/04/2017  . Hypertension, accelerated with heart disease, without CHF 01/12/2017  . Dyspnea on exertion 01/12/2017  . ESRD (end stage renal disease) ([place]) 06/07/2012  . Hyperlipidemia 04/24/2011  . HYPERTENSION, BENIGN 10/24/2009  . Type 2 diabetes mellitus with hypertension and end stage renal disease on dialysis ([place]) 10/23/2009  . OBSTRUCTIVE SLEEP APNEA 10/23/2009  . PAF (paroxysmal atrial fibrillation) ([place]) 10/23/2009  . CHF (congestive heart failure) ([place]) 10/23/2009    CMP     Component Value Date/Time   NA 138 07/27/2019 1529   NA 143 06/06/2019   K 3.5 07/27/2019 1529   CL 95 (L) 07/27/2019 1529   CO2 26 07/27/2019 1529   GLUCOSE 143 (H) 07/27/2019 1529   BUN 48 (H) 07/27/2019 1529   BUN 44 (A) 06/06/2019   CREATININE 4.61 (H) 07/27/2019 1529   CALCIUM 10.2 07/27/2019 1529    CALCIUM 8.3 (L) 10/25/2008 1450   PROT 6.5 05/28/2019 0253   ALBUMIN 2.3 (L) 05/31/2019 1319   AST 31 05/28/2019 0253   ALT 42 05/28/2019 0253   ALKPHOS 261 (H) 05/28/2019 0253   BILITOT 0.6 05/28/2019 0253   GFRNONAA 11 (L) 07/27/2019 1529   GFRAA 13 (L) 07/27/2019 1529   Recent Labs    02/20/19 1300  05/28/19 0253 05/29/19 0541 05/29/19 2316  05/30/19 0727 05/31/19 1319 06/06/19 07/27/19 1529  NA 137   < > 137  --   --  137 136 143 138  K 3.0*   < > 3.7  --   --  5.1 3.6 3.7 3.5  CL 101   < > 102  --   --  101 98  --  95*  CO2 23   < > 21*  --   --  17* 24  --  26  GLUCOSE 101*   < > 151*  --   --  99 140*  --  143*  BUN 27*   < > 80*  --   --  158* 44* 44* 48*  CREATININE 3.41*   < > 6.12*  --   --  8.32* 4.29* 3.5* 4.61*  CALCIUM 7.7*   < > 9.3  --   --  10.0 9.1  --  10.2  MG  --    < > 2.0 2.1 2.4  --   --   --   --   PHOS 2.4*  --   --   --   --  2.6 3.0  --   --    < > = values in this interval not displayed.   Recent Labs    05/26/19 0214 05/27/19 0348 05/28/19 0253 05/30/19 0727 05/31/19 1319  AST 49* 55* 31  --   --   ALT 44 55* 42  --   --   ALKPHOS 235* 272* 261*  --   --   BILITOT 0.7 0.8 0.6  --   --   PROT 6.8 7.0 6.5  --   --   ALBUMIN 2.4* 2.4* 2.2* 2.3* 2.3*   Recent Labs    05/25/19 1421  05/30/19 0727 05/31/19 1319 06/06/19 07/27/19 1529  WBC 17.5*   < > 14.6* 10.3 11.5 18.6*  NEUTROABS 14.2*  --   --   --  8 15.5*  HGB 10.7*   < > 7.4* 7.6* 9.3* 12.4*  HCT 35.1*   < > 23.1* 24.7* 29* 40.2  MCV 89.1   < > 85.9 88.5  --  89.3  PLT 251   < > 192 220 251 171   < > = values in this interval not displayed.   Recent Labs    11/11/18 1517  CHOL 113  LDLCALC 62  TRIG 60   No results found for: [facility] Lab Results  Component Value Date   TSH 1.839 05/25/2019   Lab Results  Component Value Date   HGBA1C 4.9 02/16/2019   Lab Results  Component Value Date   CHOL 113 11/11/2018   HDL 39 (L) 11/11/2018   LDLCALC 62 11/11/2018    TRIG 60 11/11/2018   CHOLHDL 2.9 11/11/2018    Significant Diagnostic Results in last 30 days:  Mr Sacrum Si Joints Wo Contrast  Result Date: 08/07/2019 CLINICAL DATA:  Stable decubitus. EXAM: MRI SACRUM WITHOUT CONTRAST TECHNIQUE: Multiplanar, multisequence MR imaging of the sacrum was performed. No intravenous contrast was administered. COMPARISON:  None. FINDINGS: Examination is quite limited due to patient motion despite multiple attempts the patient would not or could not stop moving. There is a sizable sacral wound involving the subcutaneous fat and underlying gluteal muscles. I do not see a discrete drainable soft tissue abscess. No definite MR findings for osteomyelitis. Both hips are normally located. No findings to suggest septic arthritis. Moderate-sized central disc protrusion at L4-5 with mass effect on  the thecal sac. Markedly enlarged prostate gland with median lobe hypertrophy and extensive mass effect on the base of the bladder. IMPRESSION: 1. Very limited examination due to patient motion. 2. Sacral wound in the midline and to the right involving the subcutaneous fat and the right gluteus maximus muscle in particular. There is focal cellulitis and myositis but no discrete drainable soft tissue abscess. 3. No definite MR findings to suggest osteomyelitis. 4. Sizable disc protrusion at L4-5. 5. Enlarged prostate gland with median lobe hypertrophy and mass effect on the bladder. Electronically Signed   By: Marijo Sanes M.D.   On: 08/07/2019 08:43   Dg Chest Port 1 View  Result Date: 07/27/2019 CLINICAL DATA:  Hypotension in dialysis patient. Pt denies SOB and chest pain. States he had dialysis yesterday. EXAM: PORTABLE CHEST 1 VIEW COMPARISON:  05/25/2019 FINDINGS: Heart size is UPPER normal and accentuated by portable position of the patient. There are faint airspace filling opacities throughout the lungs bilaterally, RIGHT greater than LEFT. No frank consolidations. No pleural effusions.  IMPRESSION: Bilateral infiltrates, RIGHT greater than LEFT. Findings favor mild edema. Early RIGHT LOWER lobe infiltrate is not excluded. Electronically Signed   By: Nolon Nations M.D.   On: 07/27/2019 15:54    Assessment and Plan  Paroxysmal atrial fibrillation/chronic Coumadin therapy- patient's INR today is 2.2, patient is on Coumadin 2.5 mg daily; will continue 2.5 mg daily and recheck INR 1 week    Hennie Duos , MD

## 2019-08-15 ENCOUNTER — Encounter: Payer: Self-pay | Admitting: Internal Medicine

## 2019-08-15 ENCOUNTER — Non-Acute Institutional Stay (SKILLED_NURSING_FACILITY): Payer: Medicare Other | Admitting: Internal Medicine

## 2019-08-15 DIAGNOSIS — I5032 Chronic diastolic (congestive) heart failure: Secondary | ICD-10-CM

## 2019-08-15 DIAGNOSIS — I480 Paroxysmal atrial fibrillation: Secondary | ICD-10-CM | POA: Diagnosis not present

## 2019-08-15 DIAGNOSIS — Z992 Dependence on renal dialysis: Secondary | ICD-10-CM

## 2019-08-15 DIAGNOSIS — N186 End stage renal disease: Secondary | ICD-10-CM

## 2019-08-15 DIAGNOSIS — I272 Pulmonary hypertension, unspecified: Secondary | ICD-10-CM

## 2019-08-15 DIAGNOSIS — I132 Hypertensive heart and chronic kidney disease with heart failure and with stage 5 chronic kidney disease, or end stage renal disease: Secondary | ICD-10-CM | POA: Diagnosis not present

## 2019-08-15 LAB — POCT INR: INR: 2.3 — AB (ref 0.9–1.1)

## 2019-08-15 LAB — PROTIME-INR: Protime: 25 — AB (ref 10.0–13.8)

## 2019-08-15 NOTE — Progress Notes (Signed)
Location:  [facility] [place] Room Number: 308-W Place of Service:  SNF (31)  Hennie Duos, MD  Patient Care Team: Hennie Duos, MD as PCP - General (Internal Medicine) [name], MD as Consulting Physician (Nephrology) Rehab, [facility], [facility]  Extended Emergency Contact Information Primary Emergency Contact: [name],[name] Address: [place], [place] [address] [place] Phone: [phone] Work Phone: [phone] Mobile Phone: [phone] Relation: Daughter Secondary Emergency Contact: [name] Mobile Phone: [phone] Relation: Other    Allergies: Occlusive silicone sheets [silicone], Other, and Tape  Chief Complaint  Patient presents with   Medical Management of Chronic Issues    Routine [facility] visit   Quality Metric Gaps    Hgb A1c    HPI: Patient is a 78 y.o. male who   Past Medical History:  Diagnosis Date   A-fib ([place])    Anemia    Blood transfusion    BPH (benign prostatic hyperplasia)    CHF (congestive heart failure) ([place])    Diarrhea    DM (diabetes mellitus) ([place])    ESRD on hemodialysis ([place])    Started dialysis in 2009   History of GI bleed    secondary to coumadin   HTN (hypertension)    Hyperlipidemia    OSA (obstructive sleep apnea)    uses CPAP   Secondary hyperparathyroidism of renal origin [facility])     Past Surgical History:  Procedure Laterality Date   ABDOMINAL AORTOGRAM N/A 11/15/2018   Procedure: ABDOMINAL AORTOGRAM;  Surgeon: [name], MD;  Location: [place] [facility];  Service: Cardiovascular;  Laterality: N/A;   AMPUTATION Left 12/06/2018   Procedure: AMPUTATION DIGIT LEFT FIFTH TOE;  Surgeon: Angelia Mould, MD;  Location: [facility];  Service: Vascular;  Laterality: Left;   AMPUTATION Bilateral 12/29/2018   Procedure: AMPUTATION ABOVE KNEE;  Surgeon:  Waynetta Sandy, MD;  Location: [facility];  Service: Vascular;  Laterality: Bilateral;   APPLICATION OF WOUND VAC Right 02/17/2019   Procedure: Application Of Wound Vac;  Surgeon: Marty Heck, MD;  Location: [place];  Service: Vascular;  Laterality: Right;   BVT  [id number]   Left  Basilic Vein Transposition   CHOLECYSTECTOMY     EYE SURGERY     Catarct bil   I&D EXTREMITY Right 02/17/2019   Procedure: Right above the kneee debridement;  Surgeon: Marty Heck, MD;  Location: [place];  Service: Vascular;  Laterality: Right;   INSERTION OF DIALYSIS CATHETER  05/28/2012   Procedure: INSERTION OF DIALYSIS CATHETER;  Surgeon: Mal Misty, MD;  Location: [facility] OR;  Service: Vascular;  Laterality: Right;   Left arm shuntogram.     Left forearm loop graft with 6 mm Gore-Tex graft.     LOWER EXTREMITY ANGIOGRAPHY Bilateral 11/15/2018   Procedure: LOWER EXTREMITY ANGIOGRAPHY;  Surgeon: [name], MD;  Location: [facility];  Service: Cardiovascular;  Laterality: Bilateral;   Pars plana vitrectomy with 25-gauge system     PERIPHERAL VASCULAR ATHERECTOMY Left 11/15/2018   Procedure: PERIPHERAL VASCULAR ATHERECTOMY;  Surgeon: [name], MD;  Location: [place] [facility];  Service: Cardiovascular;  Laterality: Left;  SFA with STENT   PERIPHERAL VASCULAR BALLOON ANGIOPLASTY Left 11/15/2018   Procedure: PERIPHERAL VASCULAR BALLOON ANGIOPLASTY;  Surgeon: [name], MD;  Location: [facility] [facility];  Service: Cardiovascular;  Laterality: Left;  PT    Allergies as of 08/15/2019      Reactions   Occlusive Silicone Sheets [silicone] Other (See Comments)   "Allergic," per [facility]   Other Other (See Comments)   Unknown reaction to Occlusive adhesive- "Allergic," per [facility]   Tape Itching, Other (See Comments)   Use Cloth tape only, please      Medication List       Accurate as of August 15, 2019 11:59 PM. If you have any questions, ask your nurse or doctor.          acetaminophen 500 MG tablet Commonly known as: TYLENOL Take 1,000 mg by mouth 2 (two) times daily.   ADULT NUTRITIONAL SUPPLEMENT PO Take 1 each by mouth daily. Magic Cup with dinner   atorvastatin 20 MG tablet Commonly known as: LIPITOR Take 20 mg by mouth daily.   calcium acetate 667 MG capsule Commonly known as: PHOSLO Take 1,334 mg by mouth 3 (three) times daily with meals.   ceFEPIme 1 g injection Commonly known as: MAXIPIME Inject 1 g into the vein. Give post dialysis x10   dicyclomine 20 MG tablet Commonly known as: BENTYL Take 20 mg by mouth 3 (three) times daily before meals.   ertapenem 1 g injection Commonly known as: INVANZ Inject 500 g into the muscle daily. Reconstitute 1 gram with 3.2 mL lidocaine 1% - give 1.5 mL (500 mg)   feeding supplement (PRO-STAT SUGAR FREE 64) Liqd Take 30 mLs by mouth 2 (two) times daily. 9a and 5p   Hectorol 4 MCG/2ML injection Generic drug: doxercalciferol Inject 2 mcg into the vein every Monday, Wednesday, and Friday with hemodialysis.   HYDROcodone-acetaminophen 5-325 MG tablet Commonly known as: Norco Take 1 tablet by mouth every 12 (twelve) hours.   loperamide 2 MG tablet Commonly known as: IMODIUM A-D Take 4 mg by mouth See admin instructions. Take 2 tablets ( 4 mg) by mouth after 1st loose stool as needed for diarrhea   metoprolol tartrate 25 MG tablet Commonly known as: LOPRESSOR Take 25 mg by mouth See admin instructions. Take 25 mg by mouth two times a day on Sun/Tues/Thurs/Sat and 25 mg at bedtime on Mon/Wed/Fri   midodrine 10 MG tablet Commonly known as: PROAMATINE Take one tablet (10 mg) by mouth on Monday, Wednesday, Friday 30 minutes prior to dialysis.   pantoprazole 40 MG tablet Commonly known as: PROTONIX Take 1 tablet (40 mg total) by mouth daily.   RENAL VITAMIN PO Take 1 tablet by mouth at bedtime.   vancomycin 500-5 MG/100ML-% IVPB Commonly known as: VANCOCIN Inject 500 mg into the  vein daily. Post dialysis treatment x9   Vitamin D3 1.25 MG (50000 UT) Caps Take 1 capsule by mouth once a week.   warfarin 2.5 MG tablet Commonly known as: COUMADIN Take 2.5 mg by mouth daily.       No orders of the defined types were placed in this encounter.    There is no immunization history on file for this patient.  Social History   Tobacco Use   Smoking status: Former Smoker    Types: Cigarettes    Quit date: 06/30/2004    Years since quitting: 15.1   Smokeless tobacco: Never Used  Substance Use Topics   Alcohol use: No    Review of Systems  DATA OBTAINED: from patient-limited; nursing- no acute new concerns GENERAL:  no fevers, fatigue, appetite changes SKIN: No itching, rash HEENT: No complaint RESPIRATORY:  No cough, wheezing, SOB CARDIAC: No chest pain, palpitations, lower extremity edema  GI: No abdominal pain, No N/V/D or constipation, No heartburn or reflux  GU: No dysuria, frequency or urgency, or incontinence  MUSCULOSKELETAL: No unrelieved bone/joint pain NEUROLOGIC: No headache, dizziness  PSYCHIATRIC: No overt anxiety or sadness  Vitals:   08/15/19 0946  BP: 120/68  Pulse: 82  Resp: 16  Temp: 97.9 F (36.6 C)  SpO2: 96%   Body mass index is 64.45 kg/m. Physical Exam  GENERAL APPEARANCE: Alert, moderately conversant, No acute distress  SKIN: No diaphoresis rash HEENT: Unremarkable RESPIRATORY: Breathing is even, unlabored. Lung sounds are clear   CARDIOVASCULAR: Heart RRR no murmurs, rubs or gallops. No peripheral edema  GASTROINTESTINAL: Abdomen is soft, non-tender, not distended w/ normal bowel sounds.  GENITOURINARY: Bladder non tender, not distended  MUSCULOSKELETAL: Bilateral AKA NEUROLOGIC: Cranial nerves 2-12 grossly intact. 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 ([place]) 03/24/2019   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 ([place]) 03/24/2019   Anemia due to end stage renal disease ([place]) 03/24/2019   Protein-calorie malnutrition, severe ([place]) 03/24/2019   Hyperlipidemia associated with type 2 diabetes mellitus ([place]) 02/22/2019   Amputation stump infection ([place]) 02/15/2019   Conjunctivitis of left eye 01/04/2019   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[place]) 12/30/2018   Open wound of left foot    Encounter for monitoring Coumadin therapy    OSA (obstructive sleep apnea)    HTN (hypertension)    ESRD on hemodialysis ([place])    BPH (benign prostatic hyperplasia)    Hypotension 12/07/2018   GERD (gastroesophageal reflux disease) 12/07/2018   Pressure injury of skin 12/03/2018   Symptomatic anemia 12/02/2018   Acute on chronic blood loss anemia 12/02/2018   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[place]) 12/02/2018   Lower GI bleeding 12/02/2018   Heme positive stool    Acute on chronic diastolic (congestive) heart failure ([place]) 11/19/2018   Acute CVA (cerebrovascular accident) ([place]) 11/19/2018   Hypoglycemia 11/19/2018   Peripheral vascular disease due to secondary diabetes ([place]) 11/19/2018   Dry gangrene ([place]) 11/19/2018   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   DNR (do not resuscitate) discussion    Goals of care, counseling/discussion    Palliative care by specialist    Acute hypoxemic respiratory failure ([place]) 11/06/2018   Volume overload 11/06/2018   Multifocal pneumonia XX123456   Acute metabolic encephalopathy XX123456    Physical deconditioning 11/06/2018   Acute respiratory failure with hypoxia and hypercapnia ([place]) 11/04/2018   CAP (community acquired pneumonia) 11/04/2018   Pleural effusion, right 11/04/2018   Increased ammonia level 11/04/2018   Abnormal CT of the abdomen 11/04/2018   Atrial fibrillation with RVR ([place]) 11/04/2018   Sepsis ([place]) 10/21/2018   Acute encephalopathy 10/21/2018   Elevated alkaline phosphatase level 10/21/2018   Chronic anemia 10/21/2018   Thrombocytopenia ([place]) 10/21/2018   Pulmonary HTN ([place]) 03/04/2017   Aortic valve stenosis 03/04/2017   Hypertension, accelerated with heart disease, without CHF 01/12/2017  Dyspnea on exertion 01/12/2017   ESRD (end stage renal disease) ([place]) 06/07/2012   Hyperlipidemia 04/24/2011   HYPERTENSION, BENIGN 10/24/2009   Type 2 diabetes mellitus with hypertension and end stage renal disease on dialysis ([place]) 10/23/2009   OBSTRUCTIVE SLEEP APNEA 10/23/2009   PAF (paroxysmal atrial fibrillation) ([place]) 10/23/2009   CHF (congestive heart failure) ([place]) 10/23/2009    CMP     Component Value Date/Time   NA 136 08/16/2019 1818   NA 143 06/06/2019   K 4.0 08/16/2019 1818   CL 97 (L) 08/16/2019 1818   CO2 25 08/16/2019 1818   GLUCOSE 75 08/16/2019 1818   BUN 28 (H) 08/16/2019 1818   BUN 44 (A) 06/06/2019   CREATININE 3.59 (H) 08/16/2019 1818   CALCIUM 9.5 08/16/2019 1818   CALCIUM 8.3 (L) 10/25/2008 1450   PROT 7.8 08/16/2019 1818   ALBUMIN 2.9 (L) 08/16/2019 1818   AST 54 (H) 08/16/2019 1818   ALT 38 08/16/2019 1818   ALKPHOS 229 (H) 08/16/2019 1818   BILITOT 1.2 08/16/2019 1818   GFRNONAA 15 (L) 08/16/2019 1818   GFRAA 18 (L) 08/16/2019 1818   Recent Labs    02/20/19 1300  05/28/19 0253 05/29/19 0541 05/29/19 2316 05/30/19 0727 05/31/19 1319 06/06/19 07/27/19 1529 08/16/19 1818  NA 137   < > 137  --   --  137 136 143 138 136  K 3.0*   < > 3.7  --   --  5.1 3.6 3.7 3.5 4.0  CL 101   < > 102  --    --  101 98  --  95* 97*  CO2 23   < > 21*  --   --  17* 24  --  26 25  GLUCOSE 101*   < > 151*  --   --  99 140*  --  143* 75  BUN 27*   < > 80*  --   --  158* 44* 44* 48* 28*  CREATININE 3.41*   < > 6.12*  --   --  8.32* 4.29* 3.5* 4.61* 3.59*  CALCIUM 7.7*   < > 9.3  --   --  10.0 9.1  --  10.2 9.5  MG  --    < > 2.0 2.1 2.4  --   --   --   --   --   PHOS 2.4*  --   --   --   --  2.6 3.0  --   --   --    < > = values in this interval not displayed.   Recent Labs    05/27/19 0348 05/28/19 0253 05/30/19 0727 05/31/19 1319 08/16/19 1818  AST 55* 31  --   --  54*  ALT 55* 42  --   --  38  ALKPHOS 272* 261*  --   --  229*  BILITOT 0.8 0.6  --   --  1.2  PROT 7.0 6.5  --   --  7.8  ALBUMIN 2.4* 2.2* 2.3* 2.3* 2.9*   Recent Labs    05/31/19 1319 06/06/19 07/27/19 1529 08/16/19 1818  WBC 10.3 11.5 18.6* 6.9  NEUTROABS  --  8 15.5* 4.4  HGB 7.6* 9.3* 12.4* 11.0*  HCT 24.7* 29* 40.2 34.8*  MCV 88.5  --  89.3 89.5  PLT 220 251 171 193   Recent Labs    11/11/18 1517  CHOL 113  LDLCALC 62  TRIG 60   No results found  for: [facility] Lab Results  Component Value Date   TSH 1.839 05/25/2019   Lab Results  Component Value Date   HGBA1C 4.9 02/16/2019   Lab Results  Component Value Date   CHOL 113 11/11/2018   HDL 39 (L) 11/11/2018   LDLCALC 62 11/11/2018   TRIG 60 11/11/2018   CHOLHDL 2.9 11/11/2018    Significant Diagnostic Results in last 30 days:  Dg Chest 1 View  Result Date: 08/18/2019 CLINICAL DATA:  Altered mental status for 1 day. EXAM: CHEST  1 VIEW COMPARISON:  Single-view of the chest 07/27/2019 and 05/25/2019. FINDINGS: Lung volumes are low with crowding of the bronchovascular structures. Heart size is mildly enlarged and there is pulmonary vascular congestion. No consolidative process, pneumothorax or effusion. Atherosclerosis noted. No acute or focal bony abnormality. IMPRESSION: Pulmonary vascular congestion. Atherosclerosis. Electronically Signed   By:  Inge Rise M.D.   On: 08/18/2019 09:49   Ct Head Wo Contrast  Result Date: 08/17/2019 CLINICAL DATA:  Altered level of consciousness.  Dialysis patient. EXAM: CT HEAD WITHOUT CONTRAST TECHNIQUE: Contiguous axial images were obtained from the base of the skull through the vertex without intravenous contrast. COMPARISON:  CT head 12/02/2018 FINDINGS: Brain: Advanced atrophy. Chronic microvascular ischemic changes in the white matter. Chronic ischemia in the left basal ganglia and thalamus. Small chronic infarct left parietal lobe. Negative for acute infarct, hemorrhage, mass.  No midline shift. Vascular: Negative for hyperdense vessel. Atherosclerotic calcification in carotid and vertebral arteries. Skull: Negative Sinuses/Orbits: Mild mucoperiosteal thickening left maxillary sinus. Bilateral cataract surgery Other: None IMPRESSION: Advanced atrophy and chronic ischemia.  No acute abnormality. Electronically Signed   By: Franchot Gallo M.D.   On: 08/17/2019 16:32   Mr Sacrum Si Joints Wo Contrast  Result Date: 08/07/2019 CLINICAL DATA:  Stable decubitus. EXAM: MRI SACRUM WITHOUT CONTRAST TECHNIQUE: Multiplanar, multisequence MR imaging of the sacrum was performed. No intravenous contrast was administered. COMPARISON:  None. FINDINGS: Examination is quite limited due to patient motion despite multiple attempts the patient would not or could not stop moving. There is a sizable sacral wound involving the subcutaneous fat and underlying gluteal muscles. I do not see a discrete drainable soft tissue abscess. No definite MR findings for osteomyelitis. Both hips are normally located. No findings to suggest septic arthritis. Moderate-sized central disc protrusion at L4-5 with mass effect on the thecal sac. Markedly enlarged prostate gland with median lobe hypertrophy and extensive mass effect on the base of the bladder. IMPRESSION: 1. Very limited examination due to patient motion. 2. Sacral wound in the midline  and to the right involving the subcutaneous fat and the right gluteus maximus muscle in particular. There is focal cellulitis and myositis but no discrete drainable soft tissue abscess. 3. No definite MR findings to suggest osteomyelitis. 4. Sizable disc protrusion at L4-5. 5. Enlarged prostate gland with median lobe hypertrophy and mass effect on the bladder. Electronically Signed   By: Marijo Sanes M.D.   On: 08/07/2019 08:43   Dg Chest Port 1 View  Result Date: 07/27/2019 CLINICAL DATA:  Hypotension in dialysis patient. Pt denies SOB and chest pain. States he had dialysis yesterday. EXAM: PORTABLE CHEST 1 VIEW COMPARISON:  05/25/2019 FINDINGS: Heart size is UPPER normal and accentuated by portable position of the patient. There are faint airspace filling opacities throughout the lungs bilaterally, RIGHT greater than LEFT. No frank consolidations. No pleural effusions. IMPRESSION: Bilateral infiltrates, RIGHT greater than LEFT. Findings favor mild edema. Early RIGHT LOWER lobe infiltrate  is not excluded. Electronically Signed   By: Nolon Nations M.D.   On: 07/27/2019 15:54    Assessment and Plan  Pulmonary HTN ([place]) Patient is without breathing problems; continue Lasix 40 mg daily  PAF (paroxysmal atrial fibrillation) ([place]) Stable; continue metoprolol to 25 mg twice daily, no a.m. 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 of Monday Wednesday Friday dialysis days    Hennie Duos, MD

## 2019-08-15 NOTE — Progress Notes (Signed)
Location:  [facility] Room Number: 308-W Place of Service:  SNF (31)  Edward Duos, MD  Patient Care Team: Edward Duos, MD as PCP - General (Internal Medicine) [name], MD as Consulting Physician (Nephrology) Rehab, [facility], [facility]  Extended Emergency Contact Information Primary Emergency Contact: [name],[name] Address: [place], [place] [address] [name] Phone: [phone] Work Phone: [phone] Mobile Phone: [phone] Relation: Daughter Secondary Emergency Contact: [name] Mobile Phone: [phone] Relation: Other    Allergies: Occlusive silicone sheets [silicone], Other, and Tape  Chief Complaint  Patient presents with   Acute Visit    Anticoagulation    HPI: Patient is an  78 y.o. male who is being seen for management of his Coumadin.  Patient is on Coumadin for atrial fibrillation.  Patient's INR today is 1.9 and that has been on 2.5 mg daily.  Patient has no complaints and there have been no problems with his Coumadin.  Past Medical History:  Diagnosis Date   A-fib ([place])    Anemia    Blood transfusion    BPH (benign prostatic hyperplasia)    CHF (congestive heart failure) (HCC)    Diarrhea    DM (diabetes mellitus) ([place])    ESRD on hemodialysis ([place])    Started dialysis in 2009   History of GI bleed    secondary to coumadin   HTN (hypertension)    Hyperlipidemia    OSA (obstructive sleep apnea)    uses CPAP   Secondary hyperparathyroidism of renal origin [facility])     Past Surgical History:  Procedure Laterality Date   ABDOMINAL AORTOGRAM N/A 11/15/2018   Procedure: ABDOMINAL AORTOGRAM;  Surgeon: [name], MD;  Location: [place] [facility];  Service: Cardiovascular;  Laterality: N/A;   AMPUTATION Left 12/06/2018   Procedure: AMPUTATION DIGIT LEFT FIFTH TOE;  Surgeon:  Angelia Mould, MD;  Location: [facility];  Service: Vascular;  Laterality: Left;   AMPUTATION Bilateral 12/29/2018   Procedure: AMPUTATION ABOVE KNEE;  Surgeon: Waynetta Sandy, MD;  Location: [place];  Service: Vascular;  Laterality: Bilateral;   APPLICATION OF WOUND VAC Right 02/17/2019   Procedure: Application Of Wound Vac;  Surgeon: Marty Heck, MD;  Location: [facility];  Service: Vascular;  Laterality: Right;   BVT  [id number]   Left  Basilic Vein Transposition   CHOLECYSTECTOMY     EYE SURGERY     Catarct bil   I&D EXTREMITY Right 02/17/2019   Procedure: Right above the kneee debridement;  Surgeon: Marty Heck, MD;  Location: [place];  Service: Vascular;  Laterality: Right;   INSERTION OF DIALYSIS CATHETER  05/28/2012   Procedure: INSERTION OF DIALYSIS CATHETER;  Surgeon: Mal Misty, MD;  Location: [facility] OR;  Service: Vascular;  Laterality: Right;   Left arm shuntogram.     Left forearm loop graft with 6 mm Gore-Tex graft.     LOWER EXTREMITY ANGIOGRAPHY Bilateral 11/15/2018   Procedure: LOWER EXTREMITY ANGIOGRAPHY;  Surgeon: [name], MD;  Location: [facility];  Service: Cardiovascular;  Laterality: Bilateral;   Pars plana vitrectomy with 25-gauge system     PERIPHERAL VASCULAR ATHERECTOMY Left 11/15/2018   Procedure: PERIPHERAL VASCULAR ATHERECTOMY;  Surgeon: [name], MD;  Location: [facility];  Service: Cardiovascular;  Laterality: Left;  SFA with STENT  PERIPHERAL VASCULAR BALLOON ANGIOPLASTY Left 11/15/2018   Procedure: PERIPHERAL VASCULAR BALLOON ANGIOPLASTY;  Surgeon: [name], MD;  Location: [facility];  Service: Cardiovascular;  Laterality: Left;  PT    Allergies as of 08/01/2019      Reactions   Occlusive Silicone Sheets [silicone] Other (See Comments)   "Allergic," per [facility]   Other Other (See Comments)   Unknown reaction to Occlusive adhesive- "Allergic," per [facility]   Tape Itching, Other (See  Comments)   Use Cloth tape only, please      Medication List       Accurate as of August 01, 2019 11:59 PM. If you have any questions, ask your nurse or doctor.        acetaminophen 500 MG tablet Commonly known as: TYLENOL Take 1,000 mg by mouth 2 (two) times daily.   ADULT NUTRITIONAL SUPPLEMENT PO Take 1 each by mouth daily. Magic Cup with dinner   atorvastatin 20 MG tablet Commonly known as: LIPITOR Take 20 mg by mouth daily.   calcium acetate 667 MG capsule Commonly known as: PHOSLO Take 1,334 mg by mouth 3 (three) times daily with meals.   ceFEPIme 1 g injection Commonly known as: MAXIPIME Inject 1 g into the vein. Give post dialysis x10   dicyclomine 20 MG tablet Commonly known as: BENTYL Take 20 mg by mouth 3 (three) times daily before meals.   doxycycline 100 MG tablet Commonly known as: VIBRA-TABS Take 100 mg by mouth 2 (two) times daily. x10 days, ending 08/03/19   ertapenem 1 g injection Commonly known as: INVANZ Inject 500 g into the muscle daily. Reconstitute 1 gram with 3.2 mL lidocaine 1% - give 1.5 mL (500 mg)   feeding supplement (PRO-STAT SUGAR FREE 64) Liqd Take 30 mLs by mouth 2 (two) times daily. 9a and 5p   Hectorol 4 MCG/2ML injection Generic drug: doxercalciferol Inject 2 mcg into the vein every Monday, Wednesday, and Friday with hemodialysis.   HYDROcodone-acetaminophen 5-325 MG tablet Commonly known as: Norco Take 1 tablet by mouth every 12 (twelve) hours.   loperamide 2 MG tablet Commonly known as: IMODIUM A-D Take 4 mg by mouth See admin instructions. Take 2 tablets ( 4 mg) by mouth after 1st loose stool as needed for diarrhea   metoprolol tartrate 25 MG tablet Commonly known as: LOPRESSOR Take 25 mg by mouth See admin instructions. Take 25 mg by mouth two times a day on Sun/Tues/Thurs/Sat and 25 mg at bedtime on Mon/Wed/Fri   midodrine 10 MG tablet Commonly known as: PROAMATINE Take one tablet (10 mg) by mouth on Monday,  Wednesday, Friday 30 minutes prior to dialysis.   pantoprazole 40 MG tablet Commonly known as: PROTONIX Take 1 tablet (40 mg total) by mouth daily.   RENAL VITAMIN PO Take 1 tablet by mouth at bedtime.   vancomycin 500-5 MG/100ML-% IVPB Commonly known as: VANCOCIN Inject 500 mg into the vein daily. Post dialysis treatment x9   Vitamin D3 1.25 MG (50000 UT) Caps Take 1 capsule by mouth once a week.   warfarin 2.5 MG tablet Commonly known as: COUMADIN Take 2.5 mg by mouth daily.       No orders of the defined types were placed in this encounter.    There is no immunization history on file for this patient.  Social History   Tobacco Use   Smoking status: Former Smoker    Types: Cigarettes    Quit date: 06/30/2004    Years since  quitting: 15.1   Smokeless tobacco: Never Used  Substance Use Topics   Alcohol use: No     Vitals:   08/01/19 1556  BP: 103/62  Pulse: 80  Resp: 18  Temp: (!) 97.4 F (36.3 C)   Body mass index is 64.45 kg/m.   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 ([place]) 03/24/2019   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 ([place]) 03/24/2019   Anemia due to end stage renal disease ([place]) 03/24/2019   Protein-calorie malnutrition, severe ([place]) 03/24/2019   Hyperlipidemia associated with type 2 diabetes mellitus ([place]) 02/22/2019   Amputation stump infection ([place]) 02/15/2019   Conjunctivitis of left eye 01/04/2019   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   Open wound of left foot     Encounter for monitoring Coumadin therapy    OSA (obstructive sleep apnea)    HTN (hypertension)    ESRD on hemodialysis ([place])    BPH (benign prostatic hyperplasia)    Hypotension 12/07/2018   GERD (gastroesophageal reflux disease) 12/07/2018   Pressure injury of skin 12/03/2018   Symptomatic anemia 12/02/2018   Acute on chronic blood loss anemia 12/02/2018   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   Lower GI bleeding 12/02/2018   Heme positive stool    Acute on chronic diastolic (congestive) heart failure ([place]) 11/19/2018   Acute CVA (cerebrovascular accident) ([place]) 11/19/2018   Hypoglycemia 11/19/2018   Peripheral vascular disease due to secondary diabetes ([place]) 11/19/2018   Dry gangrene ([place]) 11/19/2018   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   DNR (do not resuscitate) discussion    Goals of care, counseling/discussion    Palliative care by specialist    Acute hypoxemic respiratory failure ([place]) 11/06/2018   Volume overload 11/06/2018   Multifocal pneumonia [id number]   Acute metabolic encephalopathy [id number]   Physical deconditioning 11/06/2018   Acute respiratory failure with hypoxia and hypercapnia ([place]) 11/04/2018   CAP (community acquired pneumonia) 11/04/2018   Pleural effusion, right 11/04/2018   Increased ammonia level 11/04/2018   Abnormal CT of the abdomen 11/04/2018   Atrial fibrillation with RVR ([place]) 11/04/2018   Sepsis ([place]) 10/21/2018   Acute encephalopathy 10/21/2018   Elevated alkaline phosphatase level 10/21/2018   Chronic anemia 10/21/2018   Thrombocytopenia ([place]) 10/21/2018   Pulmonary HTN ([place]) 03/04/2017   Aortic valve stenosis 03/04/2017   Hypertension, accelerated with heart disease, without CHF 01/12/2017   Dyspnea on exertion 01/12/2017   ESRD (end stage renal disease) ([place]) 06/07/2012   Hyperlipidemia 04/24/2011   HYPERTENSION, BENIGN 10/24/2009   Type 2 diabetes  mellitus with hypertension and end stage renal disease on dialysis ([place]) 10/23/2009   OBSTRUCTIVE SLEEP APNEA 10/23/2009   PAF (paroxysmal atrial fibrillation) ([place]) 10/23/2009   CHF (congestive heart failure) ([place]) 10/23/2009    CMP     Component Value Date/Time   NA 138 07/27/2019 1529   NA 143 06/06/2019   K 3.5 07/27/2019 1529   CL 95 (L) 07/27/2019 1529   CO2 26 07/27/2019 1529   GLUCOSE 143 (H) 07/27/2019 1529  BUN 48 (H) 07/27/2019 1529   BUN 44 (A) 06/06/2019   CREATININE 4.61 (H) 07/27/2019 1529   CALCIUM 10.2 07/27/2019 1529   CALCIUM 8.3 (L) 10/25/2008 1450   PROT 6.5 05/28/2019 0253   ALBUMIN 2.3 (L) 05/31/2019 1319   AST 31 05/28/2019 0253   ALT 42 05/28/2019 0253   ALKPHOS 261 (H) 05/28/2019 0253   BILITOT 0.6 05/28/2019 0253   GFRNONAA 11 (L) 07/27/2019 1529   GFRAA 13 (L) 07/27/2019 1529   Recent Labs    02/20/19 1300  05/28/19 0253 05/29/19 0541 05/29/19 2316 05/30/19 0727 05/31/19 1319 06/06/19 07/27/19 1529  NA 137   < > 137  --   --  137 136 143 138  K 3.0*   < > 3.7  --   --  5.1 3.6 3.7 3.5  CL 101   < > 102  --   --  101 98  --  95*  CO2 23   < > 21*  --   --  17* 24  --  26  GLUCOSE 101*   < > 151*  --   --  99 140*  --  143*  BUN 27*   < > 80*  --   --  158* 44* 44* 48*  CREATININE 3.41*   < > 6.12*  --   --  8.32* 4.29* 3.5* 4.61*  CALCIUM 7.7*   < > 9.3  --   --  10.0 9.1  --  10.2  MG  --    < > 2.0 2.1 2.4  --   --   --   --   PHOS 2.4*  --   --   --   --  2.6 3.0  --   --    < > = values in this interval not displayed.   Recent Labs    05/26/19 0214 05/27/19 0348 05/28/19 0253 05/30/19 0727 05/31/19 1319  AST 49* 55* 31  --   --   ALT 44 55* 42  --   --   ALKPHOS 235* 272* 261*  --   --   BILITOT 0.7 0.8 0.6  --   --   PROT 6.8 7.0 6.5  --   --   ALBUMIN 2.4* 2.4* 2.2* 2.3* 2.3*   Recent Labs    05/25/19 1421  05/30/19 0727 05/31/19 1319 06/06/19 07/27/19 1529  WBC 17.5*   < > 14.6* 10.3 11.5 18.6*  NEUTROABS  14.2*  --   --   --  8 15.5*  HGB 10.7*   < > 7.4* 7.6* 9.3* 12.4*  HCT 35.1*   < > 23.1* 24.7* 29* 40.2  MCV 89.1   < > 85.9 88.5  --  89.3  PLT 251   < > 192 220 251 171   < > = values in this interval not displayed.   Recent Labs    11/11/18 1517  CHOL 113  LDLCALC 62  TRIG 60   No results found for: [facility] Lab Results  Component Value Date   TSH 1.839 05/25/2019   Lab Results  Component Value Date   HGBA1C 4.9 02/16/2019   Lab Results  Component Value Date   CHOL 113 11/11/2018   HDL 39 (L) 11/11/2018   LDLCALC 62 11/11/2018   TRIG 60 11/11/2018   CHOLHDL 2.9 11/11/2018    Significant Diagnostic Results in last 30 days:  Mr Sacrum Si Joints Wo Contrast  Result Date: 08/07/2019 CLINICAL  DATA:  Stable decubitus. EXAM: MRI SACRUM WITHOUT CONTRAST TECHNIQUE: Multiplanar, multisequence MR imaging of the sacrum was performed. No intravenous contrast was administered. COMPARISON:  None. FINDINGS: Examination is quite limited due to patient motion despite multiple attempts the patient would not or could not stop moving. There is a sizable sacral wound involving the subcutaneous fat and underlying gluteal muscles. I do not see a discrete drainable soft tissue abscess. No definite MR findings for osteomyelitis. Both hips are normally located. No findings to suggest septic arthritis. Moderate-sized central disc protrusion at L4-5 with mass effect on the thecal sac. Markedly enlarged prostate gland with median lobe hypertrophy and extensive mass effect on the base of the bladder. IMPRESSION: 1. Very limited examination due to patient motion. 2. Sacral wound in the midline and to the right involving the subcutaneous fat and the right gluteus maximus muscle in particular. There is focal cellulitis and myositis but no discrete drainable soft tissue abscess. 3. No definite MR findings to suggest osteomyelitis. 4. Sizable disc protrusion at L4-5. 5. Enlarged prostate gland with median lobe  hypertrophy and mass effect on the bladder. Electronically Signed   By: Marijo Sanes M.D.   On: 08/07/2019 08:43   Dg Chest Port 1 View  Result Date: 07/27/2019 CLINICAL DATA:  Hypotension in dialysis patient. Pt denies SOB and chest pain. States he had dialysis yesterday. EXAM: PORTABLE CHEST 1 VIEW COMPARISON:  05/25/2019 FINDINGS: Heart size is UPPER normal and accentuated by portable position of the patient. There are faint airspace filling opacities throughout the lungs bilaterally, RIGHT greater than LEFT. No frank consolidations. No pleural effusions. IMPRESSION: Bilateral infiltrates, RIGHT greater than LEFT. Findings favor mild edema. Early RIGHT LOWER lobe infiltrate is not excluded. Electronically Signed   By: Nolon Nations M.D.   On: 07/27/2019 15:54    Assessment and Plan  Coumadin management/atrial fibrillation- patient is normally on 2.5 mg daily of Coumadin except for Monday Wednesday when he is on 5 mg; however since patient is beginning to IV antibiotics and changing his Coumadin dose to 2.5 mg daily and will recheck again in 7 days   [name] , MD

## 2019-08-16 ENCOUNTER — Emergency Department (HOSPITAL_COMMUNITY)
Admission: EM | Admit: 2019-08-16 | Discharge: 2019-08-17 | Disposition: A | Discharge status: Home / Self Care | Payer: Medicare Other | Attending: Emergency Medicine | Admitting: Emergency Medicine

## 2019-08-16 ENCOUNTER — Other Ambulatory Visit: Payer: Self-pay

## 2019-08-16 ENCOUNTER — Non-Acute Institutional Stay (SKILLED_NURSING_FACILITY): Payer: Medicare Other | Admitting: Internal Medicine

## 2019-08-16 ENCOUNTER — Encounter (HOSPITAL_COMMUNITY): Payer: Self-pay | Admitting: Emergency Medicine

## 2019-08-16 ENCOUNTER — Encounter: Payer: Self-pay | Admitting: Internal Medicine

## 2019-08-16 ENCOUNTER — Emergency Department (HOSPITAL_COMMUNITY): Payer: Medicare Other

## 2019-08-16 DIAGNOSIS — Z87891 Personal history of nicotine dependence: Secondary | ICD-10-CM | POA: Insufficient documentation

## 2019-08-16 DIAGNOSIS — R4182 Altered mental status, unspecified: Secondary | ICD-10-CM

## 2019-08-16 DIAGNOSIS — E1122 Type 2 diabetes mellitus with diabetic chronic kidney disease: Secondary | ICD-10-CM | POA: Insufficient documentation

## 2019-08-16 DIAGNOSIS — Z79899 Other long term (current) drug therapy: Secondary | ICD-10-CM | POA: Insufficient documentation

## 2019-08-16 DIAGNOSIS — I132 Hypertensive heart and chronic kidney disease with heart failure and with stage 5 chronic kidney disease, or end stage renal disease: Secondary | ICD-10-CM | POA: Insufficient documentation

## 2019-08-16 DIAGNOSIS — Z7901 Long term (current) use of anticoagulants: Secondary | ICD-10-CM | POA: Insufficient documentation

## 2019-08-16 DIAGNOSIS — Z89512 Acquired absence of left leg below knee: Secondary | ICD-10-CM | POA: Diagnosis not present

## 2019-08-16 DIAGNOSIS — Z89511 Acquired absence of right leg below knee: Secondary | ICD-10-CM | POA: Diagnosis not present

## 2019-08-16 DIAGNOSIS — Z992 Dependence on renal dialysis: Secondary | ICD-10-CM | POA: Diagnosis not present

## 2019-08-16 DIAGNOSIS — N186 End stage renal disease: Secondary | ICD-10-CM | POA: Diagnosis not present

## 2019-08-16 DIAGNOSIS — I5032 Chronic diastolic (congestive) heart failure: Secondary | ICD-10-CM | POA: Diagnosis not present

## 2019-08-16 LAB — COMPREHENSIVE METABOLIC PANEL
ALT: 38 U/L (ref 0–44)
AST: 54 U/L — ABNORMAL HIGH (ref 15–41)
Albumin: 2.9 g/dL — ABNORMAL LOW (ref 3.5–5.0)
Alkaline Phosphatase: 229 U/L — ABNORMAL HIGH (ref 38–126)
Anion gap: 14 (ref 5–15)
BUN: 28 mg/dL — ABNORMAL HIGH (ref 8–23)
CO2: 25 mmol/L (ref 22–32)
Calcium: 9.5 mg/dL (ref 8.9–10.3)
Chloride: 97 mmol/L — ABNORMAL LOW (ref 98–111)
Creatinine, Ser: 3.59 mg/dL — ABNORMAL HIGH (ref 0.61–1.24)
GFR calc Af Amer: 18 mL/min — ABNORMAL LOW (ref >60)
GFR calc non Af Amer: 15 mL/min — ABNORMAL LOW (ref >60)
Glucose, Bld: 75 mg/dL (ref 70–99)
Potassium: 4 mmol/L (ref 3.5–5.1)
Sodium: 136 mmol/L (ref 135–145)
Total Bilirubin: 1.2 mg/dL (ref 0.3–1.2)
Total Protein: 7.8 g/dL (ref 6.5–8.1)

## 2019-08-16 LAB — CBC WITH DIFFERENTIAL/PLATELET
Abs Immature Granulocytes: 0.02 10*3/uL (ref 0.00–0.07)
Basophils Absolute: 0.1 10*3/uL (ref 0.0–0.1)
Basophils Relative: 1 %
Eosinophils Absolute: 0.3 10*3/uL (ref 0.0–0.5)
Eosinophils Relative: 5 %
HCT: 34.8 % — ABNORMAL LOW (ref 39.0–52.0)
Hemoglobin: 11 g/dL — ABNORMAL LOW (ref 13.0–17.0)
Immature Granulocytes: 0 %
Lymphocytes Relative: 17 %
Lymphs Abs: 1.2 10*3/uL (ref 0.7–4.0)
MCH: 28.3 pg (ref 26.0–34.0)
MCHC: 31.6 g/dL (ref 30.0–36.0)
MCV: 89.5 fL (ref 80.0–100.0)
Monocytes Absolute: 0.9 10*3/uL (ref 0.1–1.0)
Monocytes Relative: 13 %
Neutro Abs: 4.4 10*3/uL (ref 1.7–7.7)
Neutrophils Relative %: 64 %
Platelets: 193 10*3/uL (ref 150–400)
RBC: 3.89 MIL/uL — ABNORMAL LOW (ref 4.22–5.81)
RDW: 19.4 % — ABNORMAL HIGH (ref 11.5–15.5)
WBC: 6.9 10*3/uL (ref 4.0–10.5)
nRBC: 0 % (ref 0.0–0.2)

## 2019-08-16 NOTE — ED Notes (Signed)
[facility] here to transport pt ?

## 2019-08-16 NOTE — ED Triage Notes (Signed)
Per [facility], Pt from [facility]. Pt has been confused since Monday per staff. Pt is dialysis pt, scheduled MWF. Pt went to dialysis today and returned more confused per staff, unsure if received full tx. Pt has bilateral AKAs has sacral wound, being tx with antibiotics. Pt alert, oriented x self, place, time. He is unable to provide much hx about why he is here

## 2019-08-16 NOTE — ED Notes (Signed)
[facility] called  

## 2019-08-16 NOTE — Progress Notes (Signed)
Location:  [facility] Room Number: 308-W Place of Service:  SNF [phone]) Provider:  Granville Lewis, PA-C  Hennie Duos, MD  Patient Care Team: Hennie Duos, MD as PCP - General (Internal Medicine) [name], MD as Consulting Physician (Nephrology) Rehab, [facility], [facility]  Extended Emergency Contact Information Primary Emergency Contact: [name],[name] Address: [place], [place] [address] [name] of [place] Phone: [phone] Work Phone: [phone] Mobile Phone: [phone] Relation: Daughter Secondary Emergency Contact: [name] Mobile Phone: [phone] Relation: Other  Code Status:  Full Code Goals of care: Advanced Directive information Advanced Directives 08/15/2019  Does Patient Have a Medical Advance Directive? Yes  Type of Advance Directive Out of facility DNR (pink MOST or yellow form)  Does patient want to make changes to medical advance directive? No - Patient declined  Copy of [place] in Chart? -  Would patient like information on creating a medical advance directive? No - Patient declined  Pre-existing out of facility DNR order (yellow form or pink MOST form) Yellow form placed in chart (order not valid for inpatient use);Pink MOST form placed in chart (order not valid for inpatient use)     Chief Complaint  Patient presents with   Acute Visit    Patient seen for altered mental status    HPI:  Pt is a 78 y.o. male seen today for an acute visit secondary to continued alteration in his mental status. Patient is a long-term resident of facility with a complicated medical history including end-stage renal disease on dialysis as well as CHF diabetes atrial fibrillation bilateral above-the-knee amputation secondary to severe peripheral arterial disease He also has history of dementia and a sacral wound which  was found to be infected and is receiving antibiotics he gets Invanz here in the facility as well as Cefepime and vancomycin at dialysis- cultures were positive for E. coli ESBL and MRSA  Patient apparently had some increased confusion this morning apparently reaching into the air for invisible objects somewhat restless and fidgety.  Patient did go to dialysis and did complete dialysis but apparently his mental status continued to be altered in similar fashion which is unusual for him.--He does have some confusion at baseline but per staff this is unusual  He has returned to the facility and the behavior persists he is alert lying in bed is fidgety is reaching into the air rotating his arms and somewhat nonintentional ways appears he might be reaching for something.  He is not really complaining of any pain or discomfort- however to speak with him you have any have to really work to get his attention--he seems distracted.  He can be a somewhat poor historian at baseline but this has increased.   He does continue on Coumadin for anticoagulation with a history of atrial fibrillation his INR was 2.3 yesterday  He is afebrile vital signs appear to be relatively stable blood pressure is difficult to obtain but this is not totally new it appears after dialysis his blood pressure was 133/75 he was afebrile there as well.  He is able to speak-he will follow some verbal instructions- when the tech asked him how many fingersr she was holding up he did accurately say 3      Past Medical History:  Diagnosis Date   A-fib ([place])    Anemia    Blood  transfusion    BPH (benign prostatic hyperplasia)    CHF (congestive heart failure) ([place])    Diarrhea    DM (diabetes mellitus) ([place])    ESRD on hemodialysis ([place])    Started dialysis in 2009   History of GI bleed    secondary to coumadin   HTN (hypertension)    Hyperlipidemia    OSA (obstructive sleep apnea)    uses CPAP   Secondary  hyperparathyroidism of renal origin [facility])    Past Surgical History:  Procedure Laterality Date   ABDOMINAL AORTOGRAM N/A 11/15/2018   Procedure: ABDOMINAL AORTOGRAM;  Surgeon: [name], MD;  Location: [facility];  Service: Cardiovascular;  Laterality: N/A;   AMPUTATION Left 12/06/2018   Procedure: AMPUTATION DIGIT LEFT FIFTH TOE;  Surgeon: Angelia Mould, MD;  Location: [facility];  Service: Vascular;  Laterality: Left;   AMPUTATION Bilateral 12/29/2018   Procedure: AMPUTATION ABOVE KNEE;  Surgeon: Waynetta Sandy, MD;  Location: [facility];  Service: Vascular;  Laterality: Bilateral;   APPLICATION OF WOUND VAC Right 02/17/2019   Procedure: Application Of Wound Vac;  Surgeon: Marty Heck, MD;  Location: [facility];  Service: Vascular;  Laterality: Right;   BVT  [id number]   Left  Basilic Vein Transposition   CHOLECYSTECTOMY     EYE SURGERY     Catarct bil   I&D EXTREMITY Right 02/17/2019   Procedure: Right above the kneee debridement;  Surgeon: Marty Heck, MD;  Location: [facility];  Service: Vascular;  Laterality: Right;   INSERTION OF DIALYSIS CATHETER  05/28/2012   Procedure: INSERTION OF DIALYSIS CATHETER;  Surgeon: Mal Misty, MD;  Location: [facility] OR;  Service: Vascular;  Laterality: Right;   Left arm shuntogram.     Left forearm loop graft with 6 mm Gore-Tex graft.     LOWER EXTREMITY ANGIOGRAPHY Bilateral 11/15/2018   Procedure: LOWER EXTREMITY ANGIOGRAPHY;  Surgeon: [name], MD;  Location: [facility];  Service: Cardiovascular;  Laterality: Bilateral;   Pars plana vitrectomy with 25-gauge system     PERIPHERAL VASCULAR ATHERECTOMY Left 11/15/2018   Procedure: PERIPHERAL VASCULAR ATHERECTOMY;  Surgeon: [name], MD;  Location: [facility];  Service: Cardiovascular;  Laterality: Left;  SFA with STENT   PERIPHERAL VASCULAR BALLOON ANGIOPLASTY Left 11/15/2018   Procedure: PERIPHERAL VASCULAR BALLOON ANGIOPLASTY;   Surgeon: [name], MD;  Location: [facility];  Service: Cardiovascular;  Laterality: Left;  PT    Allergies  Allergen Reactions   Occlusive Silicone Sheets [Silicone] Other (See Comments)    "Allergic," per [facility]   Other Other (See Comments)    Unknown reaction to Occlusive adhesive- "Allergic," per [facility]   Tape Itching and Other (See Comments)    Use Cloth tape only, please    Outpatient Encounter Medications as of 08/16/2019  Medication Sig   acetaminophen (TYLENOL) 500 MG tablet Take 1,000 mg by mouth 2 (two) times daily.    Amino Acids-Protein Hydrolys (FEEDING SUPPLEMENT, PRO-STAT SUGAR FREE 64,) LIQD Take 30 mLs by mouth 2 (two) times daily. 9a and 5p   atorvastatin (LIPITOR) 20 MG tablet Take 20 mg by mouth daily.   B Complex-C-Folic Acid (RENAL VITAMIN PO) Take 1 tablet by mouth at bedtime.    calcium acetate (PHOSLO) 667 MG capsule Take 1,334 mg by mouth 3 (three) times daily with meals.   ceFEPIme (MAXIPIME) 1 g injection Inject 1 g into the vein. Give post dialysis  x10   Cholecalciferol (VITAMIN D3) 1.25 MG (50000 UT) CAPS Take 1 capsule by mouth once a week.   dicyclomine (BENTYL) 20 MG tablet Take 20 mg by mouth 3 (three) times daily before meals.   doxercalciferol (HECTOROL) 4 MCG/2ML injection Inject 2 mcg into the vein every Monday, Wednesday, and Friday with hemodialysis.   ertapenem (INVANZ) 1 g injection Inject 500 g into the muscle daily. Reconstitute 1 gram with 3.2 mL lidocaine 1% - give 1.5 mL (500 mg)   HYDROcodone-acetaminophen (NORCO) 5-325 MG tablet Take 1 tablet by mouth every 12 (twelve) hours.   loperamide (IMODIUM A-D) 2 MG tablet Take 4 mg by mouth See admin instructions. Take 2 tablets ( 4 mg) by mouth after 1st loose stool as needed for diarrhea   metoprolol tartrate (LOPRESSOR) 25 MG tablet Take 25 mg by mouth See admin instructions. Take 25 mg by mouth two times a day on Sun/Tues/Thurs/Sat and 25 mg at bedtime on Mon/Wed/Fri     midodrine (PROAMATINE) 10 MG tablet Take one tablet (10 mg) by mouth on Monday, Wednesday, Friday 30 minutes prior to dialysis.   Nutritional Supplements (ADULT NUTRITIONAL SUPPLEMENT PO) Take 1 each by mouth daily. Magic Cup with dinner   pantoprazole (PROTONIX) 40 MG tablet Take 1 tablet (40 mg total) by mouth daily.   vancomycin (VANCOCIN) 500-5 MG/100ML-% IVPB Inject 500 mg into the vein daily. Post dialysis treatment x9   warfarin (COUMADIN) 2.5 MG tablet Take 2.5 mg by mouth daily.    No facility-administered encounter medications on file as of 08/16/2019.     Review of Systems   This is difficult to obtain since patient is a poor historian and not speaking much.  However when I asked him if he was having pain he stated no.  When asked if he was doing okay he said stated he was   There is no immunization history on file for this patient. Pertinent  Health Maintenance Due  Topic Date Due   PNA vac Low Risk Adult (1 of 2 - PCV13) 11/01/2005   INFLUENZA VACCINE  07/29/2019   FOOT EXAM  12/29/2019 (Originally 11/01/1951)   OPHTHALMOLOGY EXAM  12/29/2019 (Originally 11/01/1951)   HEMOGLOBIN A1C  08/17/2019   URINE MICROALBUMIN  Discontinued   No flowsheet data found. Functional Status Survey:    Temperature is 97.7 pulse 85 respirations 20 blood pressure again difficult to obtain after dialysis was  listed as 133/75 O2 saturation is 90% Body mass index is 64.45 kg/m. Physical Exam   General he is a frail elderly male in no distress but he does appear significantly confused and restless and fidgety arms moving about in  somewhat random pattern.  His skin is warm and dry.  Eyes visual acuity appears grossly intact he was able to correctly name the number of fingers on nursing techs hand  Oropharynx he does have numerous extractions.  Chest is clear to auscultation with shallow air entry and somewhat poor respiratory effort could not really appreciate labored  breathing.  Heart is irregular irregular rate and rhythm.  Abdomen is soft does not appear to be acutely tender there are positive bowel sounds.  Musculoskeletal--is status post above-the-knee amputations bilaterally is able to move his upper extremities as noted above.  Neurologic as noted above cranial nerves appear to be grossly intact his speech is relatively at baseline when he does speak- it is hard to get him to focus however what we are asking him.questions  Psych as noted  above--he is alert--- very difficult to make him focus  Labs reviewed:  August 15, 2019.  INR was 2.3   Recent Labs    02/20/19 1300  05/28/19 0253 05/29/19 0541 05/29/19 2316 05/30/19 0727 05/31/19 1319 06/06/19 07/27/19 1529  NA 137   < > 137  --   --  137 136 143 138  K 3.0*   < > 3.7  --   --  5.1 3.6 3.7 3.5  CL 101   < > 102  --   --  101 98  --  95*  CO2 23   < > 21*  --   --  17* 24  --  26  GLUCOSE 101*   < > 151*  --   --  99 140*  --  143*  BUN 27*   < > 80*  --   --  158* 44* 44* 48*  CREATININE 3.41*   < > 6.12*  --   --  8.32* 4.29* 3.5* 4.61*  CALCIUM 7.7*   < > 9.3  --   --  10.0 9.1  --  10.2  MG  --    < > 2.0 2.1 2.4  --   --   --   --   PHOS 2.4*  --   --   --   --  2.6 3.0  --   --    < > = values in this interval not displayed.   Recent Labs    05/26/19 0214 05/27/19 0348 05/28/19 0253 05/30/19 0727 05/31/19 1319  AST 49* 55* 31  --   --   ALT 44 55* 42  --   --   ALKPHOS 235* 272* 261*  --   --   BILITOT 0.7 0.8 0.6  --   --   PROT 6.8 7.0 6.5  --   --   ALBUMIN 2.4* 2.4* 2.2* 2.3* 2.3*   Recent Labs    05/25/19 1421  05/30/19 0727 05/31/19 1319 06/06/19 07/27/19 1529  WBC 17.5*   < > 14.6* 10.3 11.5 18.6*  NEUTROABS 14.2*  --   --   --  8 15.5*  HGB 10.7*   < > 7.4* 7.6* 9.3* 12.4*  HCT 35.1*   < > 23.1* 24.7* 29* 40.2  MCV 89.1   < > 85.9 88.5  --  89.3  PLT 251   < > 192 220 251 171   < > = values in this interval not displayed.   Lab Results   Component Value Date   TSH 1.839 05/25/2019   Lab Results  Component Value Date   HGBA1C 4.9 02/16/2019   Lab Results  Component Value Date   CHOL 113 11/11/2018   HDL 39 (L) 11/11/2018   LDLCALC 62 11/11/2018   TRIG 60 11/11/2018   CHOLHDL 2.9 11/11/2018    Significant Diagnostic Results in last 30 days:  Mr Sacrum Si Joints Wo Contrast  Result Date: 08/07/2019 CLINICAL DATA:  Stable decubitus. EXAM: MRI SACRUM WITHOUT CONTRAST TECHNIQUE: Multiplanar, multisequence MR imaging of the sacrum was performed. No intravenous contrast was administered. COMPARISON:  None. FINDINGS: Examination is quite limited due to patient motion despite multiple attempts the patient would not or could not stop moving. There is a sizable sacral wound involving the subcutaneous fat and underlying gluteal muscles. I do not see a discrete drainable soft tissue abscess. No definite MR findings for osteomyelitis. Both hips are normally located. No findings to  suggest septic arthritis. Moderate-sized central disc protrusion at L4-5 with mass effect on the thecal sac. Markedly enlarged prostate gland with median lobe hypertrophy and extensive mass effect on the base of the bladder. IMPRESSION: 1. Very limited examination due to patient motion. 2. Sacral wound in the midline and to the right involving the subcutaneous fat and the right gluteus maximus muscle in particular. There is focal cellulitis and myositis but no discrete drainable soft tissue abscess. 3. No definite MR findings to suggest osteomyelitis. 4. Sizable disc protrusion at L4-5. 5. Enlarged prostate gland with median lobe hypertrophy and mass effect on the bladder. Electronically Signed   By: Marijo Sanes M.D.   On: 08/07/2019 08:43   Dg Chest Port 1 View  Result Date: 07/27/2019 CLINICAL DATA:  Hypotension in dialysis patient. Pt denies SOB and chest pain. States he had dialysis yesterday. EXAM: PORTABLE CHEST 1 VIEW COMPARISON:  05/25/2019 FINDINGS:  Heart size is UPPER normal and accentuated by portable position of the patient. There are faint airspace filling opacities throughout the lungs bilaterally, RIGHT greater than LEFT. No frank consolidations. No pleural effusions. IMPRESSION: Bilateral infiltrates, RIGHT greater than LEFT. Findings favor mild edema. Early RIGHT LOWER lobe infiltrate is not excluded. Electronically Signed   By: Nolon Nations M.D.   On: 07/27/2019 15:54    Assessment/Plan  #1 altered mental status-apparently this has persisted through the day--this was noted by dialysis staff as well.  Apparently he did complete his dialysis but has returned to the facility and am concerned about the continued altered mental status as noted above.  We will send him to the ER for expedient evaluation  [id number]*

## 2019-08-16 NOTE — Discharge Instructions (Addendum)
Continue medications and dialysis as previously scheduled.  Return to the emergency department for any new and/or concerning symptoms.

## 2019-08-16 NOTE — ED Provider Notes (Signed)
[facility] EMERGENCY DEPARTMENT Provider Note   CSN: [id number] Arrival date & time: 08/16/19  1750     History   Chief Complaint Chief Complaint  Patient presents with   Altered Mental Status    HPI [name] is a 78 y.o. male.     Patient is a 78 year old male with extensive past medical history including ESRD on HD, DM, CHF, HTN, OSA.  He was sent here from his ECF for evaluation of mental status change.  Patient went to dialysis today, when returning he was confused, disoriented, then was sent here for further evaluation.  Patient is confused and does not answer questions appropriately.  History difficult secondary to altered mental status/communication barrier.  The history is provided by the patient.  Altered Mental Status Presenting symptoms: confusion and disorientation   Severity:  Moderate Most recent episode:  Today Episode history:  Single Timing:  Constant Progression:  Unchanged Chronicity:  New   Past Medical History:  Diagnosis Date   A-fib ([place])    Anemia    Blood transfusion    BPH (benign prostatic hyperplasia)    CHF (congestive heart failure) ([place])    Diarrhea    DM (diabetes mellitus) ([place])    ESRD on hemodialysis ([place])    Started dialysis in 2009   History of GI bleed    secondary to coumadin   HTN (hypertension)    Hyperlipidemia    OSA (obstructive sleep apnea)    uses CPAP   Secondary hyperparathyroidism of renal origin [facility])     Patient Active Problem List   Diagnosis Date Noted   Infection due to ESBL-producing Escherichia coli 08/12/2019   MRSA cellulitis 08/12/2019   Sacral decubitus ulcer, stage IV ([place]) 08/05/2019   Infected decubitus ulcer, stage IV ([place]) 08/05/2019   Hypertensive heart and kidney disease with chronic diastolic congestive heart failure and stage 5 chronic kidney disease on chronic dialysis ([place]) 03/24/2019   End stage renal disease on dialysis due to type 2  diabetes mellitus ([place]) 03/24/2019   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 ([place]) 03/24/2019   Anemia due to end stage renal disease ([place]) 03/24/2019   Protein-calorie malnutrition, severe ([place]) 03/24/2019   Hyperlipidemia associated with type 2 diabetes mellitus ([place]) 02/22/2019   Amputation stump infection ([place]) 02/15/2019   Conjunctivitis of left eye 01/04/2019   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[place]) 12/30/2018   Open wound of left foot    Encounter for monitoring Coumadin therapy    OSA (obstructive sleep apnea)    HTN (hypertension)    ESRD on hemodialysis ([place])    BPH (benign prostatic hyperplasia)    Hypotension 12/07/2018   GERD (gastroesophageal reflux disease) 12/07/2018   Pressure injury of skin 12/03/2018   Symptomatic anemia 12/02/2018   Acute on chronic blood loss anemia 12/02/2018   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/02/2018   Lower GI bleeding 12/02/2018   Heme positive stool    Acute on chronic diastolic (congestive) heart failure ([place]) 11/19/2018   Acute CVA (cerebrovascular accident) ([place]) 11/19/2018   Hypoglycemia 11/19/2018   Peripheral vascular disease due to secondary diabetes ([place]) 11/19/2018   Dry gangrene ([place]) 11/19/2018   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   DNR (do not resuscitate) discussion    Goals of care, counseling/discussion    Palliative care by specialist    Acute hypoxemic respiratory failure ([place]) 11/06/2018   Volume overload 11/06/2018   Multifocal pneumonia  [id number]   Acute metabolic encephalopathy [id number]   Physical deconditioning 11/06/2018   Acute respiratory failure with hypoxia and hypercapnia ([place]) 11/04/2018   CAP (community acquired pneumonia) 11/04/2018   Pleural effusion, right 11/04/2018   Increased ammonia level 11/04/2018   Abnormal CT of the abdomen 11/04/2018   Atrial fibrillation with RVR ([place]) 11/04/2018   Sepsis ([place])  10/21/2018   Acute encephalopathy 10/21/2018   Elevated alkaline phosphatase level 10/21/2018   Chronic anemia 10/21/2018   Thrombocytopenia ([place]) 10/21/2018   Pulmonary HTN ([place]) 03/04/2017   Aortic valve stenosis 03/04/2017   Hypertension, accelerated with heart disease, without CHF 01/12/2017   Dyspnea on exertion 01/12/2017   ESRD (end stage renal disease) ([place]) 06/07/2012   Hyperlipidemia 04/24/2011   HYPERTENSION, BENIGN 10/24/2009   Type 2 diabetes mellitus with hypertension and end stage renal disease on dialysis ([place]) 10/23/2009   OBSTRUCTIVE SLEEP APNEA 10/23/2009   PAF (paroxysmal atrial fibrillation) ([place]) 10/23/2009   CHF (congestive heart failure) ([place]) 10/23/2009    Past Surgical History:  Procedure Laterality Date   ABDOMINAL AORTOGRAM N/A 11/15/2018   Procedure: ABDOMINAL AORTOGRAM;  Surgeon: [name], MD;  Location: [place] [facility];  Service: Cardiovascular;  Laterality: N/A;   AMPUTATION Left 12/06/2018   Procedure: AMPUTATION DIGIT LEFT FIFTH TOE;  Surgeon: Angelia Mould, MD;  Location: [place];  Service: Vascular;  Laterality: Left;   AMPUTATION Bilateral 12/29/2018   Procedure: AMPUTATION ABOVE KNEE;  Surgeon: Waynetta Sandy, MD;  Location: [place];  Service: Vascular;  Laterality: Bilateral;   APPLICATION OF WOUND VAC Right 02/17/2019   Procedure: Application Of Wound Vac;  Surgeon: Marty Heck, MD;  Location: [place];  Service: Vascular;  Laterality: Right;   BVT  [id number]   Left  Basilic Vein Transposition   CHOLECYSTECTOMY     EYE SURGERY     Catarct bil   I&D EXTREMITY Right 02/17/2019   Procedure: Right above the kneee debridement;  Surgeon: Marty Heck, MD;  Location: [place];  Service: Vascular;  Laterality: Right;   INSERTION OF DIALYSIS CATHETER  05/28/2012   Procedure: INSERTION OF DIALYSIS CATHETER;  Surgeon: Mal Misty, MD;  Location[facility];  Service: Vascular;  Laterality: Right;    Left arm shuntogram.     Left forearm loop graft with 6 mm Gore-Tex graft.     LOWER EXTREMITY ANGIOGRAPHY Bilateral 11/15/2018   Procedure: LOWER EXTREMITY ANGIOGRAPHY;  Surgeon: [name], MD;  Location: [place] [facility];  Service: Cardiovascular;  Laterality: Bilateral;   Pars plana vitrectomy with 25-gauge system     PERIPHERAL VASCULAR ATHERECTOMY Left 11/15/2018   Procedure: PERIPHERAL VASCULAR ATHERECTOMY;  Surgeon: [name], MD;  Location: [place] [facility];  Service: Cardiovascular;  Laterality: Left;  SFA with STENT   PERIPHERAL VASCULAR BALLOON ANGIOPLASTY Left 11/15/2018   Procedure: PERIPHERAL VASCULAR BALLOON ANGIOPLASTY;  Surgeon: [name], MD;  Location: [place] [facility];  Service: Cardiovascular;  Laterality: Left;  PT        Home Medications    Prior to Admission medications   Medication Sig Start Date End Date Taking? Authorizing Provider  acetaminophen (TYLENOL) 500 MG tablet Take 1,000 mg by mouth 2 (two) times daily.     [provider]  Amino Acids-Protein Hydrolys (FEEDING SUPPLEMENT, PRO-STAT SUGAR FREE 64,) LIQD Take 30 mLs by mouth 2 (two) times daily. 9a and 5p    [provider]  atorvastatin (LIPITOR) 20 MG  tablet Take 20 mg by mouth daily.    [provider]  B Complex-C-Folic Acid (RENAL VITAMIN PO) Take 1 tablet by mouth at bedtime.     [provider]  calcium acetate (PHOSLO) 667 MG capsule Take 1,334 mg by mouth 3 (three) times daily with meals.    [provider]  ceFEPIme (MAXIPIME) 1 g injection Inject 1 g into the vein. Give post dialysis x10    [provider]  Cholecalciferol (VITAMIN D3) 1.25 MG (50000 UT) CAPS Take 1 capsule by mouth once a week.    [provider]  dicyclomine (BENTYL) 20 MG tablet Take 20 mg by mouth 3 (three) times daily before meals.    [provider]  doxercalciferol (HECTOROL) 4 MCG/2ML injection Inject 2 mcg into the  vein every Monday, Wednesday, and Friday with hemodialysis. 05/31/19   [provider]  ertapenem (INVANZ) 1 g injection Inject 500 g into the muscle daily. Reconstitute 1 gram with 3.2 mL lidocaine 1% - give 1.5 mL (500 mg) 08/10/19 08/21/19  [provider]  HYDROcodone-acetaminophen (NORCO) 5-325 MG tablet Take 1 tablet by mouth every 12 (twelve) hours. 07/31/19   Hennie Duos, MD  loperamide (IMODIUM A-D) 2 MG tablet Take 4 mg by mouth See admin instructions. Take 2 tablets ( 4 mg) by mouth after 1st loose stool as needed for diarrhea    [provider]  metoprolol tartrate (LOPRESSOR) 25 MG tablet Take 25 mg by mouth See admin instructions. Take 25 mg by mouth two times a day on Sun/Tues/Thurs/Sat and 25 mg at bedtime on Mon/Wed/Fri    [provider]  midodrine (PROAMATINE) 10 MG tablet Take one tablet (10 mg) by mouth on Monday, Wednesday, Friday 30 minutes prior to dialysis.    [provider]  Nutritional Supplements (ADULT NUTRITIONAL SUPPLEMENT PO) Take 1 each by mouth daily. Magic Cup with dinner    [provider]  pantoprazole (PROTONIX) 40 MG tablet Take 1 tablet (40 mg total) by mouth daily. 01/01/19   Domenic Polite, MD  vancomycin [facility]) 500-5 MG/100ML-% IVPB Inject 500 mg into the vein daily. Post dialysis treatment x9 08/02/19 08/25/19  [provider]  warfarin (COUMADIN) 2.5 MG tablet Take 2.5 mg by mouth daily.     [provider]    Family History Family History  Problem Relation Age of Onset   Alzheimer's disease Mother    Diabetes Father        Amputation:  bilateral legs   Cancer Daughter        breast cancer   Diabetes Son    Heart disease Son        before age 40   Hypertension Son    Anesthesia problems Neg Hx     Social History Social History   Tobacco Use   Smoking status: Former Smoker    Types: Cigarettes    Quit date: 06/30/2004    Years since quitting: 15.1   Smokeless  tobacco: Never Used  Substance Use Topics   Alcohol use: No   Drug use: No     Allergies   Occlusive silicone sheets [silicone], Other, and Tape   Review of Systems Review of Systems  Unable to perform ROS: Mental status change  Psychiatric/Behavioral: Positive for confusion.     Physical Exam Updated Vital Signs BP (!) 148/68 (BP Location: Right Arm)    Pulse 90    Temp 98.2 F (36.8 C) (Oral)  Resp 19    SpO2 96%   Physical Exam Vitals signs and nursing note reviewed.  Constitutional:      General: He is not in acute distress.    Appearance: He is well-developed. He is not diaphoretic.  HENT:     Head: Normocephalic and atraumatic.  Neck:     Musculoskeletal: Normal range of motion and neck supple.  Cardiovascular:     Rate and Rhythm: Normal rate and regular rhythm.     Heart sounds: No murmur. No friction rub.  Pulmonary:     Effort: Pulmonary effort is normal. No respiratory distress.     Breath sounds: Normal breath sounds. No wheezing or rales.  Abdominal:     General: Bowel sounds are normal. There is no distension.     Palpations: Abdomen is soft.     Tenderness: There is no abdominal tenderness.  Musculoskeletal:     Comments: Patient status post bilateral BKA.    Skin:    General: Skin is warm and dry.     Comments: There is a 6 cm, round, partial thickness decubitus ulcer to the sacral region.  There is a wet-to-dry packing in place.  Mild surrounding erythema, but no significant purulent drainage.  Neurological:     Mental Status: He is alert and oriented to person, place, and time.     Coordination: Coordination normal.      ED Treatments / Results  Labs (all labs ordered are listed, but only abnormal results are displayed) Labs Reviewed  COMPREHENSIVE METABOLIC PANEL  CBC WITH DIFFERENTIAL/PLATELET    EKG EKG Interpretation  Date/Time:  Wednesday August 16 2019 17:52:34 EDT Ventricular Rate:  94 PR Interval:    QRS  Duration: 90 QT Interval:  397 QTC Calculation: 484 R Axis:   23 Text Interpretation:  Atrial fibrillation Ventricular premature complex Abnormal lateral Q waves Anterior infarct, old Confirmed by Veryl Speak [phone]) on 08/16/2019 6:36:36 PM   Radiology No results found.  Procedures Procedures (including critical care time)  Medications Ordered in ED Medications - No data to display   Initial Impression / Assessment and Plan / ED Course  I have reviewed the triage vital signs and the nursing notes.  Pertinent labs & imaging results that were available during my care of the patient were reviewed by me and considered in my medical decision making (see chart for details).  Patient with history of end-stage renal disease on hemodialysis and multiple other medical problems sent here for evaluation of altered mental status.  He apparently returned from dialysis this evening confused.  Patient speech initially somewhat garbled, however this has improved after the ER for several hours.  His head CT shows no acute process and work-up is otherwise unremarkable.    There was concern of a decubitus ulcer.  The patient does have a sacral ulcer which is known and being packed with wet-to-dry dressings.  I see no purulent material coming from this, minimal surrounding erythema, no white count, no fever, and the patient is not septic appearing.  I saw this patient nearly 1 month ago with a similar presentation.  His mentation now is similar to what I recall it being at that time.  I highly doubt stroke, sepsis, or other emergent cause.  Patient's confusion likely dialysis related and seems to have resolved.  Final Clinical Impressions(s) / ED Diagnoses   Final diagnoses:  None    ED Discharge Orders    None  Veryl Speak, MD 08/16/19 2112

## 2019-08-16 NOTE — ED Notes (Signed)
[facility] called. 

## 2019-08-17 NOTE — ED Notes (Signed)
Patient denies pain and is resting comfortably.  

## 2019-08-19 ENCOUNTER — Encounter: Payer: Self-pay | Admitting: Internal Medicine

## 2019-08-19 NOTE — Assessment & Plan Note (Signed)
Stable; continue metoprolol 25 mg 3 times daily except for no morning dose on dialysis days Monday Wednesday Friday; continue midodrine 10 mg in the a.m. of Monday Wednesday Friday dialysis days

## 2019-08-19 NOTE — Assessment & Plan Note (Signed)
Stable; continue metoprolol to 25 mg twice daily, no a.m. dose on Monday Wednesday Fridays, dialysis days for rate and Coumadin as prophylaxis; Coumadin is actively titrated

## 2019-08-19 NOTE — Assessment & Plan Note (Signed)
Patient is without breathing problems; continue Lasix 40 mg daily

## 2019-08-20 ENCOUNTER — Encounter: Payer: Self-pay | Admitting: Internal Medicine

## 2019-08-20 NOTE — Addendum Note (Signed)
Addended by: Inocencio Homes D on: 08/20/2019 08:27 PM   Modules accepted: Level of Service, SmartSet

## 2019-08-23 NOTE — Addendum Note (Signed)
Addended by: Inocencio Homes D on: 08/23/2019 11:14 AM   Modules accepted: Level of Service, SmartSet

## 2019-08-29 ENCOUNTER — Other Ambulatory Visit: Payer: Self-pay | Admitting: Internal Medicine

## 2019-08-29 MED ORDER — HYDROCODONE-ACETAMINOPHEN 5-325 MG PO TABS: 1.0000 | ORAL_TABLET | Freq: Two times a day (BID) | ORAL | 0 refills | Status: DC

## 2019-09-01 ENCOUNTER — Encounter: Payer: Self-pay | Admitting: Internal Medicine

## 2019-09-01 ENCOUNTER — Non-Acute Institutional Stay (SKILLED_NURSING_FACILITY): Payer: Medicare Other | Admitting: Internal Medicine

## 2019-09-01 DIAGNOSIS — I480 Paroxysmal atrial fibrillation: Secondary | ICD-10-CM | POA: Diagnosis not present

## 2019-09-01 DIAGNOSIS — R791 Abnormal coagulation profile: Secondary | ICD-10-CM | POA: Diagnosis not present

## 2019-09-01 NOTE — Progress Notes (Signed)
Location:  [facility] Room Number: 414-D Place of Service:  SNF (31)  Edward Duos, MD  Patient Care Team: Edward Duos, MD as PCP - General (Internal Medicine) [name], MD as Consulting Physician (Nephrology) Rehab, [facility], [facility]  Extended Emergency Contact Information Primary Emergency Contact: [name],[name] Address: [place], [place] [address] [name] of [place] Phone: [phone] Work Phone: [phone] Mobile Phone: [phone] Relation: Daughter Secondary Emergency Contact: [name] Mobile Phone: [phone] Relation: Other    Allergies: Occlusive silicone sheets [silicone], Other, and Tape  Chief Complaint  Patient presents with   Acute Visit    INR check    HPI: Patient is a 78 y.o. male who is being seen for INR check.  Patient's INR is 3.1.  Patient is currently on 5 mg Monday Wednesday and 2.5 mg every other day.  Patient has no complaints no bleeding..   Past Medical History:  Diagnosis Date   A-fib ([place])    Anemia    Blood transfusion    BPH (benign prostatic hyperplasia)    CHF (congestive heart failure) (HCC)    Diarrhea    DM (diabetes mellitus) ([place])    ESRD on hemodialysis ([place])    Started dialysis in 2009   History of GI bleed    secondary to coumadin   HTN (hypertension)    Hyperlipidemia    OSA (obstructive sleep apnea)    uses CPAP   Secondary hyperparathyroidism of renal origin [facility])     Past Surgical History:  Procedure Laterality Date   ABDOMINAL AORTOGRAM N/A 11/15/2018   Procedure: ABDOMINAL AORTOGRAM;  Surgeon: [name], MD;  Location: [facility];  Service: Cardiovascular;  Laterality: N/A;   AMPUTATION Left 12/06/2018   Procedure: AMPUTATION DIGIT LEFT FIFTH TOE;  Surgeon: Angelia Mould, MD;  Location: [facility];  Service: Vascular;   Laterality: Left;   AMPUTATION Bilateral 12/29/2018   Procedure: AMPUTATION ABOVE KNEE;  Surgeon: Waynetta Sandy, MD;  Location: [facility];  Service: Vascular;  Laterality: Bilateral;   APPLICATION OF WOUND VAC Right 02/17/2019   Procedure: Application Of Wound Vac;  Surgeon: Marty Heck, MD;  Location: [facility];  Service: Vascular;  Laterality: Right;   BVT  [id number]   Left  Basilic Vein Transposition   CHOLECYSTECTOMY     EYE SURGERY     Catarct bil   I&D EXTREMITY Right 02/17/2019   Procedure: Right above the kneee debridement;  Surgeon: Marty Heck, MD;  Location: [facility];  Service: Vascular;  Laterality: Right;   INSERTION OF DIALYSIS CATHETER  05/28/2012   Procedure: INSERTION OF DIALYSIS CATHETER;  Surgeon: Mal Misty, MD;  Location: [facility] OR;  Service: Vascular;  Laterality: Right;   Left arm shuntogram.     Left forearm loop graft with 6 mm Gore-Tex graft.     LOWER EXTREMITY ANGIOGRAPHY Bilateral 11/15/2018   Procedure: LOWER EXTREMITY ANGIOGRAPHY;  Surgeon: [name], MD;  Location: [facility];  Service: Cardiovascular;  Laterality: Bilateral;   Pars plana vitrectomy with 25-gauge system     PERIPHERAL VASCULAR ATHERECTOMY Left 11/15/2018   Procedure: PERIPHERAL VASCULAR ATHERECTOMY;  Surgeon: [name], MD;  Location: [facility];  Service: Cardiovascular;  Laterality: Left;  SFA with STENT   PERIPHERAL VASCULAR BALLOON ANGIOPLASTY Left 11/15/2018   Procedure:  PERIPHERAL VASCULAR BALLOON ANGIOPLASTY;  Surgeon: [name], MD;  Location: [facility];  Service: Cardiovascular;  Laterality: Left;  PT    Allergies as of 09/01/2019      Reactions   Occlusive Silicone Sheets [silicone] Other (See Comments)   "Allergic," per [facility]   Other Other (See Comments)   Unknown reaction to Occlusive adhesive- "Allergic," per [facility]   Tape Itching, Other (See Comments)   Use Cloth tape only, please      Medication List         Accurate as of September 01, 2019  4:48 PM. If you have any questions, ask your nurse or doctor.        STOP taking these medications   ceFEPIme 1 g injection Commonly known as: MAXIPIME Stopped by: Inocencio Homes, MD     TAKE these medications   acetaminophen 500 MG tablet Commonly known as: TYLENOL Take 1,000 mg by mouth 2 (two) times daily.   ADULT NUTRITIONAL SUPPLEMENT PO Take 1 each by mouth daily. Magic Cup with dinner   atorvastatin 20 MG tablet Commonly known as: LIPITOR Take 20 mg by mouth daily.   calcium acetate 667 MG capsule Commonly known as: PHOSLO Take 1,334 mg by mouth 3 (three) times daily with meals.   dicyclomine 20 MG tablet Commonly known as: BENTYL Take 20 mg by mouth 3 (three) times daily before meals.   feeding supplement (PRO-STAT SUGAR FREE 64) Liqd Take 30 mLs by mouth 2 (two) times daily. 9a and 5p   Hectorol 4 MCG/2ML injection Generic drug: doxercalciferol Inject 2 mcg into the vein every Monday, Wednesday, and Friday with hemodialysis.   HYDROcodone-acetaminophen 5-325 MG tablet Commonly known as: Norco Take 1 tablet by mouth every 12 (twelve) hours.   loperamide 2 MG tablet Commonly known as: IMODIUM A-D Take 4 mg by mouth See admin instructions. Take 2 tablets ( 4 mg) by mouth after 1st loose stool as needed for diarrhea   metoprolol tartrate 25 MG tablet Commonly known as: LOPRESSOR Take 25 mg by mouth See admin instructions. Take 25 mg by mouth two times a day on Sun/Tues/Thurs/Sat and 25 mg at bedtime on Mon/Wed/Fri   midodrine 10 MG tablet Commonly known as: PROAMATINE Take one tablet (10 mg) by mouth on Monday, Wednesday, Friday 30 minutes prior to dialysis.   pantoprazole 40 MG tablet Commonly known as: PROTONIX Take 1 tablet (40 mg total) by mouth daily.   RENAL VITAMIN PO Take 1 tablet by mouth at bedtime.   Vitamin D3 1.25 MG (50000 UT) Caps Take 1 capsule by mouth once a week.   warfarin 2.5 MG  tablet Commonly known as: COUMADIN Take 2.5 mg by mouth See admin instructions. Tue-Thu-Fri-Sat-Sun   Coumadin 5 MG tablet Generic drug: warfarin Take 5 mg by mouth See admin instructions. Take Mondays and Wednesdays       No orders of the defined types were placed in this encounter.    There is no immunization history on file for this patient.  Social History   Tobacco Use   Smoking status: Former Smoker    Types: Cigarettes    Quit date: 06/30/2004    Years since quitting: 15.1   Smokeless tobacco: Never Used  Substance Use Topics   Alcohol use: No     Vitals:   09/01/19 1633  BP: 120/68  Pulse: 82  Resp: 16  Temp: 97.9 F (36.6 C)  SpO2: 96%   Body mass index is  64.45 kg/m.   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 ([place]) 03/24/2019   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 ([place]) 03/24/2019   Anemia due to end stage renal disease ([place]) 03/24/2019   Protein-calorie malnutrition, severe ([place]) 03/24/2019   Hyperlipidemia associated with type 2 diabetes mellitus ([place]) 02/22/2019   Amputation stump infection ([place]) 02/15/2019   Conjunctivitis of left eye 01/04/2019   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[place]) 12/30/2018   Open wound of left foot    Encounter for monitoring Coumadin therapy    OSA (obstructive sleep apnea)    HTN (hypertension)    ESRD on hemodialysis (HCC)    BPH (benign prostatic hyperplasia)    Hypotension 12/07/2018   GERD (gastroesophageal reflux disease) 12/07/2018    Pressure injury of skin 12/03/2018   Symptomatic anemia 12/02/2018   Acute on chronic blood loss anemia 12/02/2018   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   DNR (do not resuscitate) discussion    Goals of care, counseling/discussion    Palliative care by specialist    Acute hypoxemic respiratory failure ([place]) 11/06/2018   Volume overload 11/06/2018   Multifocal pneumonia [id number]   Acute metabolic encephalopathy XX123456   Physical deconditioning 11/06/2018   Acute respiratory failure with hypoxia and hypercapnia ([place]) 11/04/2018   CAP (community acquired pneumonia) 11/04/2018   Pleural effusion, right 11/04/2018   Increased ammonia level 11/04/2018   Abnormal CT of the abdomen 11/04/2018   Atrial fibrillation with RVR ([place]) 11/04/2018   Sepsis ([place]) 10/21/2018   Acute encephalopathy 10/21/2018   Elevated alkaline phosphatase level 10/21/2018   Chronic anemia 10/21/2018   Thrombocytopenia ([place]) 10/21/2018   Pulmonary HTN ([place]) 03/04/2017   Aortic valve stenosis 03/04/2017   Hypertension, accelerated with heart disease, without CHF 01/12/2017   Dyspnea on exertion 01/12/2017   ESRD (end stage renal disease) ([place]) 06/07/2012   Hyperlipidemia 04/24/2011   HYPERTENSION, BENIGN 10/24/2009   Type 2 diabetes mellitus with hypertension and end stage renal disease on dialysis ([place]) 10/23/2009   OBSTRUCTIVE SLEEP APNEA 10/23/2009   PAF (paroxysmal atrial fibrillation) ([place]) 10/23/2009   CHF (congestive heart failure) ([place]) 10/23/2009    CMP     Component  Value Date/Time   NA 136 08/16/2019 1818   NA 143 06/06/2019   K 4.0 08/16/2019 1818   CL 97 (L) 08/16/2019 1818   CO2 25 08/16/2019 1818   GLUCOSE 75 08/16/2019 1818   BUN 28 (H) 08/16/2019 1818   BUN 44 (A) 06/06/2019   CREATININE 3.59 (H) 08/16/2019 1818   CALCIUM 9.5 08/16/2019 1818   CALCIUM 8.3 (L) 10/25/2008 1450   PROT 7.8 08/16/2019 1818   ALBUMIN 2.9 (L) 08/16/2019 1818   AST 54 (  H) 08/16/2019 1818   ALT 38 08/16/2019 1818   ALKPHOS 229 (H) 08/16/2019 1818   BILITOT 1.2 08/16/2019 1818   GFRNONAA 15 (L) 08/16/2019 1818   GFRAA 18 (L) 08/16/2019 1818   Recent Labs    02/20/19 1300  05/28/19 0253 05/29/19 0541 05/29/19 2316 05/30/19 0727 05/31/19 1319 06/06/19 07/27/19 1529 08/16/19 1818  NA 137   < > 137  --   --  137 136 143 138 136  K 3.0*   < > 3.7  --   --  5.1 3.6 3.7 3.5 4.0  CL 101   < > 102  --   --  101 98  --  95* 97*  CO2 23   < > 21*  --   --  17* 24  --  26 25  GLUCOSE 101*   < > 151*  --   --  99 140*  --  143* 75  BUN 27*   < > 80*  --   --  158* 44* 44* 48* 28*  CREATININE 3.41*   < > 6.12*  --   --  8.32* 4.29* 3.5* 4.61* 3.59*  CALCIUM 7.7*   < > 9.3  --   --  10.0 9.1  --  10.2 9.5  MG  --    < > 2.0 2.1 2.4  --   --   --   --   --   PHOS 2.4*  --   --   --   --  2.6 3.0  --   --   --    < > = values in this interval not displayed.   Recent Labs    05/27/19 0348 05/28/19 0253 05/30/19 0727 05/31/19 1319 08/16/19 1818  AST 55* 31  --   --  54*  ALT 55* 42  --   --  38  ALKPHOS 272* 261*  --   --  229*  BILITOT 0.8 0.6  --   --  1.2  PROT 7.0 6.5  --   --  7.8  ALBUMIN 2.4* 2.2* 2.3* 2.3* 2.9*   Recent Labs    05/31/19 1319 06/06/19 07/27/19 1529 08/16/19 1818  WBC 10.3 11.5 18.6* 6.9  NEUTROABS  --  8 15.5* 4.4  HGB 7.6* 9.3* 12.4* 11.0*  HCT 24.7* 29* 40.2 34.8*  MCV 88.5  --  89.3 89.5  PLT 220 251 171 193   Recent Labs    11/11/18 1517  CHOL 113  LDLCALC 62  TRIG 60   No results found for: [facility] Lab  Results  Component Value Date   TSH 1.839 05/25/2019   Lab Results  Component Value Date   HGBA1C 4.9 02/16/2019   Lab Results  Component Value Date   CHOL 113 11/11/2018   HDL 39 (L) 11/11/2018   LDLCALC 62 11/11/2018   TRIG 60 11/11/2018   CHOLHDL 2.9 11/11/2018    Significant Diagnostic Results in last 30 days:  Dg Chest 1 View  Result Date: 08/18/2019 CLINICAL DATA:  Altered mental status for 1 day. EXAM: CHEST  1 VIEW COMPARISON:  Single-view of the chest 07/27/2019 and 05/25/2019. FINDINGS: Lung volumes are low with crowding of the bronchovascular structures. Heart size is mildly enlarged and there is pulmonary vascular congestion. No consolidative process, pneumothorax or effusion. Atherosclerosis noted. No acute or focal bony abnormality. IMPRESSION: Pulmonary vascular congestion. Atherosclerosis. Electronically Signed   By: Inge Rise M.D.   On: 08/18/2019 09:49  Ct Head Wo Contrast  Result Date: 08/17/2019 CLINICAL DATA:  Altered level of consciousness.  Dialysis patient. EXAM: CT HEAD WITHOUT CONTRAST TECHNIQUE: Contiguous axial images were obtained from the base of the skull through the vertex without intravenous contrast. COMPARISON:  CT head 12/02/2018 FINDINGS: Brain: Advanced atrophy. Chronic microvascular ischemic changes in the white matter. Chronic ischemia in the left basal ganglia and thalamus. Small chronic infarct left parietal lobe. Negative for acute infarct, hemorrhage, mass.  No midline shift. Vascular: Negative for hyperdense vessel. Atherosclerotic calcification in carotid and vertebral arteries. Skull: Negative Sinuses/Orbits: Mild mucoperiosteal thickening left maxillary sinus. Bilateral cataract surgery Other: None IMPRESSION: Advanced atrophy and chronic ischemia.  No acute abnormality. Electronically Signed   By: Franchot Gallo M.D.   On: 08/17/2019 16:32   Mr Sacrum Si Joints Wo Contrast  Result Date: 08/07/2019 CLINICAL DATA:  Stable decubitus.  EXAM: MRI SACRUM WITHOUT CONTRAST TECHNIQUE: Multiplanar, multisequence MR imaging of the sacrum was performed. No intravenous contrast was administered. COMPARISON:  None. FINDINGS: Examination is quite limited due to patient motion despite multiple attempts the patient would not or could not stop moving. There is a sizable sacral wound involving the subcutaneous fat and underlying gluteal muscles. I do not see a discrete drainable soft tissue abscess. No definite MR findings for osteomyelitis. Both hips are normally located. No findings to suggest septic arthritis. Moderate-sized central disc protrusion at L4-5 with mass effect on the thecal sac. Markedly enlarged prostate gland with median lobe hypertrophy and extensive mass effect on the base of the bladder. IMPRESSION: 1. Very limited examination due to patient motion. 2. Sacral wound in the midline and to the right involving the subcutaneous fat and the right gluteus maximus muscle in particular. There is focal cellulitis and myositis but no discrete drainable soft tissue abscess. 3. No definite MR findings to suggest osteomyelitis. 4. Sizable disc protrusion at L4-5. 5. Enlarged prostate gland with median lobe hypertrophy and mass effect on the bladder. Electronically Signed   By: Marijo Sanes M.D.   On: 08/07/2019 08:43    Assessment and Plan  Slightly supratherapeutic INR/atrial fibrillation- plan to decrease Coumadin to 5 mg on Monday only and 2.5 mg every other day; will recheck in 1 week      Edward [name] , MD

## 2019-09-04 ENCOUNTER — Encounter: Payer: Self-pay | Admitting: Internal Medicine

## 2019-09-07 ENCOUNTER — Encounter: Payer: Self-pay | Admitting: Internal Medicine

## 2019-09-07 ENCOUNTER — Non-Acute Institutional Stay (SKILLED_NURSING_FACILITY): Payer: Medicare Other | Admitting: Internal Medicine

## 2019-09-07 DIAGNOSIS — Z89611 Acquired absence of right leg above knee: Secondary | ICD-10-CM

## 2019-09-07 DIAGNOSIS — N186 End stage renal disease: Secondary | ICD-10-CM

## 2019-09-07 DIAGNOSIS — I10 Essential (primary) hypertension: Secondary | ICD-10-CM | POA: Diagnosis not present

## 2019-09-07 DIAGNOSIS — I959 Hypotension, unspecified: Secondary | ICD-10-CM

## 2019-09-07 DIAGNOSIS — Z992 Dependence on renal dialysis: Secondary | ICD-10-CM

## 2019-09-07 DIAGNOSIS — I480 Paroxysmal atrial fibrillation: Secondary | ICD-10-CM | POA: Diagnosis not present

## 2019-09-07 DIAGNOSIS — Z89612 Acquired absence of left leg above knee: Secondary | ICD-10-CM

## 2019-09-07 DIAGNOSIS — E1122 Type 2 diabetes mellitus with diabetic chronic kidney disease: Secondary | ICD-10-CM

## 2019-09-07 DIAGNOSIS — I5032 Chronic diastolic (congestive) heart failure: Secondary | ICD-10-CM | POA: Diagnosis not present

## 2019-09-07 NOTE — Progress Notes (Signed)
Location:  [place] Room Number: 308-W Place of Service:  SNF [phone]) Provider:  Granville Lewis, PA-C  Hennie Duos, MD  Patient Care Team: Hennie Duos, MD as PCP - General (Internal Medicine) [name], MD as Consulting Physician (Nephrology) Rehab, [facility], [facility]  Extended Emergency Contact Information Primary Emergency Contact: [name],[name] Address: [place], [place] [address] [name] of [place] Phone: [phone] Work Phone: [phone] Mobile Phone: [phone] Relation: Daughter Secondary Emergency Contact: [name] Mobile Phone: [phone] Relation: Other  Code Status:  DNR Goals of care: Advanced Directive information Advanced Directives 09/07/2019  Does Patient Have a Medical Advance Directive? Yes  Type of Advance Directive Out of facility DNR (pink MOST or yellow form)  Does patient want to make changes to medical advance directive? No - Patient declined  Copy of [place] in Chart? -  Would patient like information on creating a medical advance directive? No - Patient declined  Pre-existing out of facility DNR order (yellow form or pink MOST form) Yellow form placed in chart (order not valid for inpatient use);Pink MOST form placed in chart (order not valid for inpatient use)     Chief Complaint  Patient presents with  . 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 male seen today for medical management of chronic diseases.  As noted above.  Patient is a long-term resident of the facility and is on dialysis with end-stage renal disease he receives dialysis on Monday  Wednesdays and Fridays.  He recently completed antibiotic treatment including ertapenem and vancomycin for infection of a sacral ulcer he is completed the antibiotic- he remains afebrile and appears to be relatively back at his baseline.  Recently he did have some bouts of increased confusion and possible hypotension and went to the ER but no acute etiology was found-today he is back at his baseline and appears he has been this way now for a few weeks which is encouraging.  He continues to have some confusion.  He is on chronic anticoagulation with a history of atrial fibrillation he is on Lopressor for rate control he does not receive this on dialysis mornings however he does have Midodrine on dialysis days secondary to some history of hypotension with dialysis  He is on Coumadin for anticoagulation does have some history of a supratherapeutic INR it was 3.1 on September 4 and changes were made to his dosing he is now 5 mg on Mondays and 2.5 mg all other days and update INR is pending for tomorrow.  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  He does receive Vicodin twice a day as well as Tylenol twice a day for pain control and this appears to be effective.    Regards to hypertension again he is on beta-blocker twice daily again this is held on dialysis mornings because of risk of hypotension.  Currently he is lying in bed comfortably he is mildly confused but appears to be relatively baseline he is  bright and alert and cooperative with exam   Past Medical History:  Diagnosis Date  . A-fib ([place])   . Anemia   . Blood transfusion   . BPH (benign prostatic hyperplasia)   . CHF (congestive heart failure) ([place])   . Diarrhea   . DM (diabetes mellitus) ([place])   . ESRD on hemodialysis ([place])    Started dialysis in 2009  . History of  GI bleed    secondary to coumadin  . HTN (hypertension)   . Hyperlipidemia   . OSA (obstructive sleep apnea)    uses CPAP  . Secondary hyperparathyroidism of renal origin [facility])    Past Surgical History:  Procedure Laterality Date  . ABDOMINAL AORTOGRAM N/A 11/15/2018   Procedure: ABDOMINAL AORTOGRAM;  Surgeon: [name], MD;  Location: [place] CV [facility];  Service: Cardiovascular;  Laterality: N/A;  . AMPUTATION Left 12/06/2018   Procedure: AMPUTATION DIGIT LEFT FIFTH TOE;  Surgeon: Angelia Mould, MD;  Location: [place];  Service: Vascular;  Laterality: Left;  . AMPUTATION Bilateral 12/29/2018   Procedure: AMPUTATION ABOVE KNEE;  Surgeon: Waynetta Sandy, MD;  Location: [place];  Service: Vascular;  Laterality: Bilateral;  . APPLICATION OF WOUND VAC Right 02/17/2019   Procedure: Application Of Wound Vac;  Surgeon: Marty Heck, MD;  Location: [place];  Service: Vascular;  Laterality: Right;  . BVT  [id number]   Left  Basilic Vein Transposition  . CHOLECYSTECTOMY    . EYE SURGERY     Catarct bil  . I&D EXTREMITY Right 02/17/2019   Procedure: Right above the kneee debridement;  Surgeon: Marty Heck, MD;  Location: [place];  Service: Vascular;  Laterality: Right;  . INSERTION OF DIALYSIS CATHETER  05/28/2012   Procedure: INSERTION OF DIALYSIS CATHETER;  Surgeon: Mal Misty, MD;  Location: [place];  Service: Vascular;  Laterality: Right;  . Left arm shuntogram.    . Left forearm loop graft with 6 mm Gore-Tex graft.    . LOWER EXTREMITY ANGIOGRAPHY Bilateral 11/15/2018   Procedure: LOWER EXTREMITY ANGIOGRAPHY;  Surgeon: [name], MD;  Location: [place] [facility];  Service: Cardiovascular;  Laterality: Bilateral;  . Pars plana vitrectomy with 25-gauge system    . PERIPHERAL VASCULAR ATHERECTOMY Left 11/15/2018   Procedure: PERIPHERAL VASCULAR ATHERECTOMY;  Surgeon: [name], MD;  Location: [place] [facility];  Service: Cardiovascular;   Laterality: Left;  SFA with STENT  . PERIPHERAL VASCULAR BALLOON ANGIOPLASTY Left 11/15/2018   Procedure: PERIPHERAL VASCULAR BALLOON ANGIOPLASTY;  Surgeon: [name], MD;  Location: [place] [facility];  Service: Cardiovascular;  Laterality: Left;  PT    Allergies  Allergen Reactions  . Occlusive Silicone Sheets [Silicone] Other (See Comments)    "Allergic," per MAR  . Other Other (See Comments)    Unknown reaction to Occlusive adhesive- "Allergic," per MAR  . Tape Itching and Other (See Comments)    Use Cloth tape only, please    Outpatient Encounter Medications as of 09/07/2019  Medication Sig  . acetaminophen (TYLENOL) 500 MG tablet Take 1,000 mg by mouth 2 (two) times daily.   . Amino Acids-Protein Hydrolys (FEEDING SUPPLEMENT, PRO-STAT SUGAR FREE 64,) LIQD Take 30 mLs by mouth 2 (two) times daily. 9a and 5p  . atorvastatin (LIPITOR) 20 MG tablet Take 20 mg by mouth daily.  . B Complex-C-Folic Acid (RENAL VITAMIN PO) Take 1 tablet by mouth at bedtime.   . calcium acetate (PHOSLO) 667 MG capsule  Take 1,334 mg by mouth 3 (three) times daily with meals.  . Cholecalciferol (VITAMIN D3) 1.25 MG (50000 UT) CAPS Take 1 capsule by mouth once a week.  . dicyclomine (BENTYL) 20 MG tablet Take 20 mg by mouth 3 (three) times daily before meals.  Marland Kitchen doxercalciferol (HECTOROL) 4 MCG/2ML injection Inject 2 mcg into the vein every Monday, Wednesday, and Friday with hemodialysis.  Marland Kitchen HYDROcodone-acetaminophen (NORCO) 5-325 MG tablet Take 1 tablet by mouth every 12 (twelve) hours.  Marland Kitchen loperamide (IMODIUM A-D) 2 MG tablet Take 4 mg by mouth See admin instructions. Take 2 tablets ( 4 mg) by mouth after 1st loose stool as needed for diarrhea  . metoprolol tartrate (LOPRESSOR) 25 MG tablet Take 25 mg by mouth See admin instructions. Take 25 mg by mouth two times a day on Sun/Tues/Thurs/Sat and 25 mg at bedtime on Mon/Wed/Fri  . midodrine (PROAMATINE) 10 MG tablet Take one tablet (10 mg) by mouth on  Monday, Wednesday, Friday 30 minutes prior to dialysis.  . Nutritional Supplements (ADULT NUTRITIONAL SUPPLEMENT PO) Take 1 each by mouth daily. Magic Cup with dinner  . pantoprazole (PROTONIX) 40 MG tablet Take 1 tablet (40 mg total) by mouth daily.  Marland Kitchen warfarin (COUMADIN) 2.5 MG tablet Take 2.5 mg by mouth See admin instructions. Give daily except for Mondays.  Marland Kitchen warfarin (COUMADIN) 5 MG tablet Take 5 mg by mouth See admin instructions. Take Mondays only.   No facility-administered encounter medications on file as of 09/07/2019.     Review of Systems somewhat limited since patient appears to be a poor historian provided by nursing as well  General is not complaining of any fever or chills.  Skin no complaints of rashes or itching.  Head ears eyes nose mouth and throat not complain of visual changes sore throat or difficulty swallowing.  Respiratory is not complaining of cough or shortness of breath.  Cardiac no complaints of chest pain or edema.  GI does not complain of having abdominal pain no nausea or vomiting has been noted or constipation to my knowledge.  GU has end-stage renal disease on dialysis does not complain of dysuria.  Musculoskeletal is status post bilateral above-the-knee amputations he does not really complain of pain at this point.  Neurologic no complaints of dizziness or headache or numbness.  Ipsych he does have what appears to be relatively baseline confusion no behaviors have been noted by nursing or indications of acute depression or anxiety staff   There is no immunization history on file for this patient. Pertinent  Health Maintenance Due  Topic Date Due  . PNA vac Low Risk Adult (1 of 2 - PCV13) 11/01/2005  . INFLUENZA VACCINE  07/29/2019  . HEMOGLOBIN A1C  08/17/2019  . FOOT EXAM  12/29/2019 (Originally 11/01/1951)  . OPHTHALMOLOGY EXAM  12/29/2019 (Originally 11/01/1951)  . URINE MICROALBUMIN  Discontinued   No flowsheet data found. Functional  Status Survey:    Vitals:   09/07/19 1347  BP: 135/61  Pulse: 78  Resp: 18  Temp: (!) 97.3 F (36.3 C)  TempSrc: Oral  Weight: 118 lb 12.8 oz (53.9 kg)  Height: 3' (0.914 m)   Body mass index is 64.45 kg/m. Physical Exam   In general this is a somewhat frail elderly male in no distress he is bright and alert today.  His skin is warm and dry.  Eyes visual acuity appears grossly intact his conjunctive is somewhat icteric there is no drainage or discharge.  Oropharynx is clear  mucous membranes moist he has numerous extractions.  Chest is clear to auscultation there is no labored breathing.  Heart is irregular irregular rate and rhythm with baseline systolic murmur he does not have evidence of increased edema.  GI abdomen is soft nontender with positive bowel sounds.  Musculoskeletal is able to move upper extremities appears at baseline there is a shunt site left upper arm which is currently wrapped with a positive bruit-he hasbilateral above-the-knee amputations surgical sites appear well-healed.  Neurologic appears grossly intact his speech is clear he does not speak a whole lot cannot really appreciate lateralizing findings moves his extremities at baseline.  Psych he is oriented to self he can tell me his birthdate but cannot really tell me who the president is or the city he is in or what day or year it is.      Labs reviewed: Recent Labs    02/20/19 1300  05/28/19 0253 05/29/19 0541 05/29/19 2316 05/30/19 0727 05/31/19 1319 06/06/19 07/27/19 1529 08/16/19 1818  NA 137   < > 137  --   --  137 136 143 138 136  K 3.0*   < > 3.7  --   --  5.1 3.6 3.7 3.5 4.0  CL 101   < > 102  --   --  101 98  --  95* 97*  CO2 23   < > 21*  --   --  17* 24  --  26 25  GLUCOSE 101*   < > 151*  --   --  99 140*  --  143* 75  BUN 27*   < > 80*  --   --  158* 44* 44* 48* 28*  CREATININE 3.41*   < > 6.12*  --   --  8.32* 4.29* 3.5* 4.61* 3.59*  CALCIUM 7.7*   < > 9.3  --   --   10.0 9.1  --  10.2 9.5  MG  --    < > 2.0 2.1 2.4  --   --   --   --   --   PHOS 2.4*  --   --   --   --  2.6 3.0  --   --   --    < > = values in this interval not displayed.   Recent Labs    05/27/19 0348 05/28/19 0253 05/30/19 0727 05/31/19 1319 08/16/19 1818  AST 55* 31  --   --  54*  ALT 55* 42  --   --  38  ALKPHOS 272* 261*  --   --  229*  BILITOT 0.8 0.6  --   --  1.2  PROT 7.0 6.5  --   --  7.8  ALBUMIN 2.4* 2.2* 2.3* 2.3* 2.9*   Recent Labs    05/31/19 1319 06/06/19 07/27/19 1529 08/16/19 1818  WBC 10.3 11.5 18.6* 6.9  NEUTROABS  --  8 15.5* 4.4  HGB 7.6* 9.3* 12.4* 11.0*  HCT 24.7* 29* 40.2 34.8*  MCV 88.5  --  89.3 89.5  PLT 220 251 171 193   Lab Results  Component Value Date   TSH 1.839 05/25/2019   Lab Results  Component Value Date   HGBA1C 4.9 02/16/2019   Lab Results  Component Value Date   CHOL 113 11/11/2018   HDL 39 (L) 11/11/2018   LDLCALC 62 11/11/2018   TRIG 60 11/11/2018   CHOLHDL 2.9 11/11/2018    Significant Diagnostic Results in last 30  days:  Dg Chest 1 View  Result Date: 08/18/2019 CLINICAL DATA:  Altered mental status for 1 day. EXAM: CHEST  1 VIEW COMPARISON:  Single-view of the chest 07/27/2019 and 05/25/2019. FINDINGS: Lung volumes are low with crowding of the bronchovascular structures. Heart size is mildly enlarged and there is pulmonary vascular congestion. No consolidative process, pneumothorax or effusion. Atherosclerosis noted. No acute or focal bony abnormality. IMPRESSION: Pulmonary vascular congestion. Atherosclerosis. Electronically Signed   By: Inge Rise M.D.   On: 08/18/2019 09:49   Ct Head Wo Contrast  Result Date: 08/17/2019 CLINICAL DATA:  Altered level of consciousness.  Dialysis patient. EXAM: CT HEAD WITHOUT CONTRAST TECHNIQUE: Contiguous axial images were obtained from the base of the skull through the vertex without intravenous contrast. COMPARISON:  CT head 12/02/2018 FINDINGS: Brain: Advanced atrophy.  Chronic microvascular ischemic changes in the white matter. Chronic ischemia in the left basal ganglia and thalamus. Small chronic infarct left parietal lobe. Negative for acute infarct, hemorrhage, mass.  No midline shift. Vascular: Negative for hyperdense vessel. Atherosclerotic calcification in carotid and vertebral arteries. Skull: Negative Sinuses/Orbits: Mild mucoperiosteal thickening left maxillary sinus. Bilateral cataract surgery Other: None IMPRESSION: Advanced atrophy and chronic ischemia.  No acute abnormality. Electronically Signed   By: Franchot Gallo M.D.   On: 08/17/2019 16:32    Assessment/Plan  #1 history of atrial fibrillation this appears rate controlled on Lopressor-she is on Coumadin for anticoagulation INR was 3.1 on September 4 and medication adjustments were made update INR is pending for tomorrow he currently is on 5 mg on Mondays and 2.5 mg all other days.  2.  History of CHF at this point appears compensated he does not show evidence of volume overload no complaints of chest pain or shortness of breath no significant edema that I can see again this is complicated since he does have bilateral lower extremity amputations but clinically he appears to be stable without complaints.  3.  History of sacral ulcer this could not be assessed today because of patient positioning and wrapping of the site- he has completed antibiotic treatment for infection and apparently this is stabilized this is followed closely by wound care.  4.-  Hypertension this appears to be stable again he does have some hypotension apparently more so associated with dialysis recent listed blood pressures 135/61-131/89-128/68- I see one systolic of 123456 but this is on the high end of his range.  He is on Lopressor-he does have Midodrine on dialysis days to help with any possible hypotension.  5.-History of diabetes this appears to be diet-controlled last hemoglobin A1c in February was 4.9 will try to get an  update on this.  6.  History of hyperlipidemia he is on Lipitor LDL was 62 on most recent lab.  7.  History of peripheral vascular disease pain appears to be controlled he is status post bilateral lower extremity above-the-knee amputation she does receive Vicodin 5-325 mg twice daily as well as Tylenol thousand milligrams twice daily.  8-ESRD-Continues on dialysis M-W-F---  [id number]

## 2019-09-08 ENCOUNTER — Non-Acute Institutional Stay (SKILLED_NURSING_FACILITY): Payer: Medicare Other | Admitting: Internal Medicine

## 2019-09-08 DIAGNOSIS — I480 Paroxysmal atrial fibrillation: Secondary | ICD-10-CM

## 2019-09-08 DIAGNOSIS — Z7901 Long term (current) use of anticoagulants: Secondary | ICD-10-CM | POA: Diagnosis not present

## 2019-09-08 DIAGNOSIS — Z5181 Encounter for therapeutic drug level monitoring: Secondary | ICD-10-CM

## 2019-09-08 LAB — HEMOGLOBIN A1C: Hemoglobin A1C: 4.7

## 2019-09-11 ENCOUNTER — Encounter: Payer: Self-pay | Admitting: Internal Medicine

## 2019-09-11 NOTE — Progress Notes (Signed)
Location:  [place] Room Number: 414-D Place of Service:  SNF (31)  Hennie Duos, MD  Patient Care Team: Hennie Duos, MD as PCP - General (Internal Medicine) [name], MD as Consulting Physician (Nephrology) Rehab, [place] ([place], [facility]  Extended Emergency Contact Information Primary Emergency Contact: [name],[name] Address: [place], [place] [address] [name] of [place] Phone: [phone] Work Phone: [phone] Mobile Phone: [phone] Relation: Daughter Secondary Emergency Contact: [name] Mobile Phone: [phone] Relation: Other    Allergies: Occlusive silicone sheets [silicone], Other, and Tape  Chief Complaint  Patient presents with  . Acute Visit    Anticoagulation management    HPI: Patient is a 78 y.o. male who is being seen for routine INR visit.  Patient is on Coumadin for atrial fibrillation.  There have been no problems or complaints.  Past Medical History:  Diagnosis Date  . A-fib ([place])   . Anemia   . Blood transfusion   . BPH (benign prostatic hyperplasia)   . CHF (congestive heart failure) ([place])   . Diarrhea   . DM (diabetes mellitus) ([place])   . ESRD on hemodialysis ([place])    Started dialysis in 2009  . History of GI bleed    secondary to coumadin  . HTN (hypertension)   . Hyperlipidemia   . OSA (obstructive sleep apnea)    uses CPAP  . Secondary hyperparathyroidism of renal origin [facility])     Past Surgical History:  Procedure Laterality Date  . ABDOMINAL AORTOGRAM N/A 11/15/2018   Procedure: ABDOMINAL AORTOGRAM;  Surgeon: [name], MD;  Location: [place] CV LAB;  Service: Cardiovascular;  Laterality: N/A;  . AMPUTATION Left 12/06/2018   Procedure: AMPUTATION DIGIT LEFT FIFTH TOE;  Surgeon: Angelia Mould, MD;  Location: [place];  Service: Vascular;  Laterality: Left;  . AMPUTATION  Bilateral 12/29/2018   Procedure: AMPUTATION ABOVE KNEE;  Surgeon: Waynetta Sandy, MD;  Location: [place];  Service: Vascular;  Laterality: Bilateral;  . APPLICATION OF WOUND VAC Right 02/17/2019   Procedure: Application Of Wound Vac;  Surgeon: Marty Heck, MD;  Location: [place];  Service: Vascular;  Laterality: Right;  . BVT  [id number]   Left  Basilic Vein Transposition  . CHOLECYSTECTOMY    . EYE SURGERY     Catarct bil  . I&D EXTREMITY Right 02/17/2019   Procedure: Right above the kneee debridement;  Surgeon: Marty Heck, MD;  Location: [place];  Service: Vascular;  Laterality: Right;  . INSERTION OF DIALYSIS CATHETER  05/28/2012   Procedure: INSERTION OF DIALYSIS CATHETER;  Surgeon: Mal Misty, MD;  Location: [place];  Service: Vascular;  Laterality: Right;  . Left arm shuntogram.    . Left forearm loop graft with 6 mm Gore-Tex graft.    . LOWER EXTREMITY ANGIOGRAPHY Bilateral 11/15/2018   Procedure: LOWER EXTREMITY ANGIOGRAPHY;  Surgeon: [name], MD;  Location: [place] CV LAB;  Service: Cardiovascular;  Laterality: Bilateral;  . Pars plana vitrectomy with 25-gauge system    . PERIPHERAL VASCULAR ATHERECTOMY Left 11/15/2018   Procedure: PERIPHERAL VASCULAR ATHERECTOMY;  Surgeon: [name], MD;  Location: [place] CV LAB;  Service: Cardiovascular;  Laterality: Left;  SFA with STENT  . PERIPHERAL VASCULAR BALLOON ANGIOPLASTY Left 11/15/2018   Procedure: PERIPHERAL VASCULAR BALLOON ANGIOPLASTY;  Surgeon: [name], MD;  Location: [place] CV LAB;  Service: Cardiovascular;  Laterality: Left;  PT    Allergies as of 09/08/2019      Reactions   Occlusive Silicone Sheets [silicone] Other (See Comments)   "Allergic," per [facility]   Other Other (See Comments)   Unknown reaction to Occlusive adhesive- "Allergic," per [facility]   Tape Itching, Other (See Comments)   Use Cloth tape only, please      Medication List       Accurate as of September 08, 2019 11:59 PM. If you have any questions, ask your nurse or doctor.        acetaminophen 500 MG tablet Commonly known as: TYLENOL Take 1,000 mg by mouth 2 (two) times daily.   ADULT NUTRITIONAL SUPPLEMENT PO Take 1 each by mouth daily. Magic Cup with dinner   atorvastatin 20 MG tablet Commonly known as: LIPITOR Take 20 mg by mouth daily.   calcium acetate 667 MG capsule Commonly known as: PHOSLO Take 1,334 mg by mouth 3 (three) times daily with meals.   dicyclomine 20 MG tablet Commonly known as: BENTYL Take 20 mg by mouth 3 (three) times daily before meals.   feeding supplement (PRO-STAT SUGAR FREE 64) Liqd Take 30 mLs by mouth 2 (two) times daily. 9a and 5p   Hectorol 4 MCG/2ML injection Generic drug: doxercalciferol Inject 2 mcg into the vein every Monday, Wednesday, and Friday with hemodialysis.   HYDROcodone-acetaminophen 5-325 MG tablet Commonly known as: Norco Take 1 tablet by mouth every 12 (twelve) hours.   loperamide 2 MG tablet Commonly known as: IMODIUM A-D Take 4 mg by mouth See admin instructions. Take 2 tablets ( 4 mg) by mouth after 1st loose stool as needed for diarrhea   metoprolol tartrate 25 MG tablet Commonly known as: LOPRESSOR Take 25 mg by mouth See admin instructions. Take 25 mg by mouth two times a day on Sun/Tues/Thurs/Sat and 25 mg at bedtime on Mon/Wed/Fri   midodrine 10 MG tablet Commonly known as: PROAMATINE Take one tablet (10 mg) by mouth on Monday, Wednesday, Friday 30 minutes prior to dialysis.   pantoprazole 40 MG tablet Commonly known as: PROTONIX Take 1 tablet (40 mg total) by mouth daily.   RENAL VITAMIN PO Take 1 tablet by mouth at bedtime.   Vitamin D3 1.25 MG (50000 UT) Caps Take 1 capsule by mouth once a week.   Coumadin 5 MG tablet Generic drug: warfarin Take 5 mg by mouth See admin instructions. Take Mondays   warfarin 2.5 MG tablet Commonly known as: COUMADIN Take 2.5 mg by mouth See admin instructions. Take  on Sun-Tue-Thu-Fri-Sat   warfarin 5 MG tablet Commonly known as: COUMADIN Take 5 mg by mouth daily. Take on Mondays and Wednesdays       No orders of the defined types were placed in this encounter.    There is no immunization history on file for this patient.  Social History   Tobacco Use  . Smoking status: Former Smoker    Types: Cigarettes    Quit date: 06/30/2004    Years since quitting: 15.2  . Smokeless tobacco: Never Used  Substance Use Topics  . Alcohol use: No     Vitals:   09/11/19 1127  BP: 140/84  Pulse: 73  Resp: 16  Temp: 98.2 F (36.8 C)   Body mass index is 59.67 kg/m.   Patient Active Problem List   Diagnosis Date Noted  . Infection due to ESBL-producing Escherichia coli 08/12/2019  .  MRSA cellulitis 08/12/2019  . Sacral decubitus ulcer, stage IV ([place]) 08/05/2019  . Infected decubitus ulcer, stage IV ([place]) 08/05/2019  . 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 Anemia due to end stage renal disease ([place]) 03/24/2019  . Protein-calorie malnutrition, severe ([place]) 03/24/2019  . Hyperlipidemia associated with type 2 diabetes mellitus ([place]) 02/22/2019  . Amputation stump infection ([place]) 02/15/2019  . Conjunctivitis of left eye 01/04/2019  . 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[place]) 12/30/2018  . Open wound of left foot   . Encounter for monitoring Coumadin therapy   . OSA (obstructive sleep apnea)   . HTN (hypertension)   . ESRD on hemodialysis ([place])   . BPH (benign prostatic hyperplasia)   . Hypotension 12/07/2018  . GERD (gastroesophageal reflux disease) 12/07/2018  . Pressure injury of skin 12/03/2018  . Symptomatic anemia 12/02/2018  . Acute on chronic blood loss  anemia 12/02/2018  . 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 Lower GI bleeding 12/02/2018  . Heme positive stool   . Acute on chronic diastolic (congestive) heart failure ([place]) 11/19/2018  . Acute CVA (cerebrovascular accident) ([place]) 11/19/2018  . Hypoglycemia 11/19/2018  . Peripheral vascular disease due to secondary diabetes ([place]) 11/19/2018  . Dry gangrene ([place]) 11/19/2018  . 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 DNR (do not resuscitate) discussion   . Goals of care, counseling/discussion   . Palliative care by specialist   . Acute hypoxemic respiratory failure ([place]) 11/06/2018  . Volume overload 11/06/2018  . Multifocal pneumonia 11/06/2018  . Acute metabolic encephalopathy [id number]  . Physical deconditioning 11/06/2018  . Acute respiratory failure with hypoxia and hypercapnia ([place]) 11/04/2018  . CAP (community acquired pneumonia) 11/04/2018  . Pleural effusion, right 11/04/2018  . Increased ammonia level 11/04/2018  . Abnormal CT of the abdomen 11/04/2018  . Atrial fibrillation with RVR ([place]) 11/04/2018  . Sepsis ([place]) 10/21/2018  . Acute encephalopathy 10/21/2018  . Elevated alkaline phosphatase level 10/21/2018  . Chronic anemia 10/21/2018  . Thrombocytopenia ([place]) 10/21/2018  . Pulmonary HTN ([place]) 03/04/2017  . Aortic valve stenosis 03/04/2017  . Hypertension, accelerated with heart disease, without CHF 01/12/2017  . Dyspnea on exertion 01/12/2017  . ESRD (end stage renal disease) ([place]) 06/07/2012  . Hyperlipidemia 04/24/2011  . HYPERTENSION, BENIGN 10/24/2009  . Type 2 diabetes mellitus with hypertension and end stage renal disease on dialysis ([place]) 10/23/2009  . OBSTRUCTIVE SLEEP APNEA 10/23/2009  . PAF (paroxysmal atrial fibrillation) ([place]) 10/23/2009  . CHF (congestive heart failure) ([place]) 10/23/2009    CMP     Component Value Date/Time   NA 136 08/16/2019 1818   NA 143 06/06/2019   K 4.0 08/16/2019 1818   CL 97 (L)  08/16/2019 1818   CO2 25 08/16/2019 1818   GLUCOSE 75 08/16/2019 1818   BUN 28 (H) 08/16/2019 1818   BUN 44 (A) 06/06/2019   CREATININE 3.59 (H) 08/16/2019 1818   CALCIUM 9.5 08/16/2019 1818   CALCIUM 8.3 (L) 10/25/2008 1450   PROT 7.8 08/16/2019 1818   ALBUMIN 2.9 (L) 08/16/2019 1818   AST 54 (H) 08/16/2019 1818   ALT 38 08/16/2019 1818   ALKPHOS 229 (H) 08/16/2019 1818   BILITOT 1.2 08/16/2019 1818  GFRNONAA 15 (L) 08/16/2019 1818   GFRAA 18 (L) 08/16/2019 1818   Recent Labs    02/20/19 1300  05/28/19 0253 05/29/19 0541 05/29/19 2316 05/30/19 0727 05/31/19 1319 06/06/19 07/27/19 1529 08/16/19 1818  NA 137   < > 137  --   --  137 136 143 138 136  K 3.0*   < > 3.7  --   --  5.1 3.6 3.7 3.5 4.0  CL 101   < > 102  --   --  101 98  --  95* 97*  CO2 23   < > 21*  --   --  17* 24  --  26 25  GLUCOSE 101*   < > 151*  --   --  99 140*  --  143* 75  BUN 27*   < > 80*  --   --  158* 44* 44* 48* 28*  CREATININE 3.41*   < > 6.12*  --   --  8.32* 4.29* 3.5* 4.61* 3.59*  CALCIUM 7.7*   < > 9.3  --   --  10.0 9.1  --  10.2 9.5  MG  --    < > 2.0 2.1 2.4  --   --   --   --   --   PHOS 2.4*  --   --   --   --  2.6 3.0  --   --   --    < > = values in this interval not displayed.   Recent Labs    05/27/19 0348 05/28/19 0253 05/30/19 0727 05/31/19 1319 08/16/19 1818  AST 55* 31  --   --  54*  ALT 55* 42  --   --  38  ALKPHOS 272* 261*  --   --  229*  BILITOT 0.8 0.6  --   --  1.2  PROT 7.0 6.5  --   --  7.8  ALBUMIN 2.4* 2.2* 2.3* 2.3* 2.9*   Recent Labs    05/31/19 1319 06/06/19 07/27/19 1529 08/16/19 1818  WBC 10.3 11.5 18.6* 6.9  NEUTROABS  --  8 15.5* 4.4  HGB 7.6* 9.3* 12.4* 11.0*  HCT 24.7* 29* 40.2 34.8*  MCV 88.5  --  89.3 89.5  PLT 220 251 171 193   Recent Labs    11/11/18 1517  CHOL 113  LDLCALC 62  TRIG 60   No results found for: [facility] Lab Results  Component Value Date   TSH 1.839 05/25/2019   Lab Results  Component Value Date   HGBA1C 4.9  02/16/2019   Lab Results  Component Value Date   CHOL 113 11/11/2018   HDL 39 (L) 11/11/2018   LDLCALC 62 11/11/2018   TRIG 60 11/11/2018   CHOLHDL 2.9 11/11/2018    Significant Diagnostic Results in last 30 days:  Dg Chest 1 View  Result Date: 08/18/2019 CLINICAL DATA:  Altered mental status for 1 day. EXAM: CHEST  1 VIEW COMPARISON:  Single-view of the chest 07/27/2019 and 05/25/2019. FINDINGS: Lung volumes are low with crowding of the bronchovascular structures. Heart size is mildly enlarged and there is pulmonary vascular congestion. No consolidative process, pneumothorax or effusion. Atherosclerosis noted. No acute or focal bony abnormality. IMPRESSION: Pulmonary vascular congestion. Atherosclerosis. Electronically Signed   By: Inge Rise M.D.   On: 08/18/2019 09:49   Ct Head Wo Contrast  Result Date: 08/17/2019 CLINICAL DATA:  Altered level of consciousness.  Dialysis patient. EXAM: CT HEAD WITHOUT CONTRAST  TECHNIQUE: Contiguous axial images were obtained from the base of the skull through the vertex without intravenous contrast. COMPARISON:  CT head 12/02/2018 FINDINGS: Brain: Advanced atrophy. Chronic microvascular ischemic changes in the white matter. Chronic ischemia in the left basal ganglia and thalamus. Small chronic infarct left parietal lobe. Negative for acute infarct, hemorrhage, mass.  No midline shift. Vascular: Negative for hyperdense vessel. Atherosclerotic calcification in carotid and vertebral arteries. Skull: Negative Sinuses/Orbits: Mild mucoperiosteal thickening left maxillary sinus. Bilateral cataract surgery Other: None IMPRESSION: Advanced atrophy and chronic ischemia.  No acute abnormality. Electronically Signed   By: Franchot Gallo M.D.   On: 08/17/2019 16:32    Assessment and Plan  Atrial fibrillation/anticoagulated on Coumadin-patient's INR is 1.9 on 5 mg of Coumadin on Monday and 2.5 mg of Coumadin the other 6days; we will change that to 5 mg on Monday  and Wednesday and 2.5 mg 1 every other day and repeat INR in 1 week.    Hennie Duos, MD

## 2019-09-15 ENCOUNTER — Non-Acute Institutional Stay (SKILLED_NURSING_FACILITY): Payer: Medicare Other | Admitting: Internal Medicine

## 2019-09-15 DIAGNOSIS — Z7901 Long term (current) use of anticoagulants: Secondary | ICD-10-CM | POA: Diagnosis not present

## 2019-09-15 DIAGNOSIS — I480 Paroxysmal atrial fibrillation: Secondary | ICD-10-CM | POA: Diagnosis not present

## 2019-09-18 ENCOUNTER — Encounter: Payer: Self-pay | Admitting: Internal Medicine

## 2019-09-18 NOTE — Progress Notes (Signed)
Location:  [place] Room Number: 414-D Place of Service: [facility] SN[facility] ([phone])  Hennie Duos, MD  Patient Care Team: Hennie Duos, MD as PCP - General (Internal Medicine) [name], MD as Consulting Physician (Nephrology) Rehab, [place] ([place], [facility]  Extended Emergency Contact Information Primary Emergency Contact: [name],[name] Address: [place], [place] [address] [name] of [place] Phone: [phone] Work Phone: [phone] Mobile Phone: [phone] Relation: Daughter Secondary Emergency Contact: [name] Mobile Phone: [phone] Relation: Other    Allergies: Occlusive silicone sheets [silicone], Other, and Tape  Chief Complaint  Patient presents with  . Acute Visit    Patient seen for anticoagulation management    HPI: Patient is 78 y.o. male with history of atrial fib who is being seen for INR check.  INR today is 2 on Coumadin 5 mg Monday Wednesday and Coumadin 2.5 mg on all other days.    Past Medical History:  Diagnosis Date  . A-fib ([place])   . Anemia   . Blood transfusion   . BPH (benign prostatic hyperplasia)   . CHF (congestive heart failure) ([place])   . Diarrhea   . DM (diabetes mellitus) ([place])   . ESRD on hemodialysis ([place])    Started dialysis in 2009  . History of GI bleed    secondary to coumadin  . HTN (hypertension)   . Hyperlipidemia   . OSA (obstructive sleep apnea)    uses CPAP  . Secondary hyperparathyroidism of renal origin [facility])     Past Surgical History:  Procedure Laterality Date  . ABDOMINAL AORTOGRAM N/A 11/15/2018   Procedure: ABDOMINAL AORTOGRAM;  Surgeon: [name], MD;  Location: [place] [facility];  Service: Cardiovascular;  Laterality: N/A;  . AMPUTATION Left 12/06/2018   Procedure: AMPUTATION DIGIT LEFT FIFTH TOE;  Surgeon: Angelia Mould, MD;  Location: [place];  Service:  Vascular;  Laterality: Left;  . AMPUTATION Bilateral 12/29/2018   Procedure: AMPUTATION ABOVE KNEE;  Surgeon: Waynetta Sandy, MD;  Location: [place];  Service: Vascular;  Laterality: Bilateral;  . APPLICATION OF WOUND VAC Right 02/17/2019   Procedure: Application Of Wound Vac;  Surgeon: Marty Heck, MD;  Location: [place];  Service: Vascular;  Laterality: Right;  . BVT  [id number]   Left  Basilic Vein Transposition  . CHOLECYSTECTOMY    . EYE SURGERY     Catarct bil  . I&D EXTREMITY Right 02/17/2019   Procedure: Right above the kneee debridement;  Surgeon: Marty Heck, MD;  Location: [place];  Service: Vascular;  Laterality: Right;  . INSERTION OF DIALYSIS CATHETER  05/28/2012   Procedure: INSERTION OF DIALYSIS CATHETER;  Surgeon: Mal Misty, MD;  Location: [place];  Service: Vascular;  Laterality: Right;  . Left arm shuntogram.    . Left forearm loop graft with 6 mm Gore-Tex graft.    . LOWER EXTREMITY ANGIOGRAPHY Bilateral 11/15/2018   Procedure: LOWER EXTREMITY ANGIOGRAPHY;  Surgeon: [name], MD;  Location: [place] [facility];  Service: Cardiovascular;  Laterality: Bilateral;  . Pars plana vitrectomy with 25-gauge system    . PERIPHERAL VASCULAR ATHERECTOMY Left 11/15/2018   Procedure: PERIPHERAL VASCULAR ATHERECTOMY;  Surgeon: [name], MD;  Location: [place] [facility];  Service: Cardiovascular;  Laterality: Left;  SFA with STENT  . PERIPHERAL VASCULAR BALLOON ANGIOPLASTY Left 11/15/2018   Procedure:  PERIPHERAL VASCULAR BALLOON ANGIOPLASTY;  Surgeon: [name], MD;  Location: [place] [facility];  Service: Cardiovascular;  Laterality: Left;  PT    Allergies as of 09/15/2019      Reactions   Occlusive Silicone Sheets [silicone] Other (See Comments)   "Allergic," per [facility]   Other Other (See Comments)   Unknown reaction to Occlusive adhesive- "Allergic," per [facility]   Tape Itching, Other (See Comments)   Use Cloth tape only, please      Medication  List       Accurate as of September 15, 2019 11:59 PM. If you have any questions, ask your nurse or doctor.        acetaminophen 500 MG tablet Commonly known as: TYLENOL Take 1,000 mg by mouth 2 (two) times daily.   ADULT NUTRITIONAL SUPPLEMENT PO Take 1 each by mouth daily. Magic Cup with dinner   atorvastatin 20 MG tablet Commonly known as: LIPITOR Take 20 mg by mouth daily.   calcium acetate 667 MG capsule Commonly known as: PHOSLO Take 1,334 mg by mouth 3 (three) times daily with meals.   dicyclomine 20 MG tablet Commonly known as: BENTYL Take 20 mg by mouth 3 (three) times daily before meals.   feeding supplement (PRO-STAT SUGAR FREE 64) Liqd Take 30 mLs by mouth 2 (two) times daily. 9a and 5p   Hectorol 4 MCG/2ML injection Generic drug: doxercalciferol Inject 2 mcg into the vein every Monday, Wednesday, and Friday with hemodialysis.   HYDROcodone-acetaminophen 5-325 MG tablet Commonly known as: Norco Take 1 tablet by mouth every 12 (twelve) hours.   loperamide 2 MG tablet Commonly known as: IMODIUM A-D Take 4 mg by mouth See admin instructions. Take 2 tablets ( 4 mg) by mouth after 1st loose stool as needed for diarrhea   metoprolol tartrate 25 MG tablet Commonly known as: LOPRESSOR Take 25 mg by mouth See admin instructions. Take 25 mg by mouth two times a day on Sun/Tues/Thurs/Sat and 25 mg at bedtime on Mon/Wed/Fri   midodrine 10 MG tablet Commonly known as: PROAMATINE Take one tablet (10 mg) by mouth on Monday, Wednesday, Friday 30 minutes prior to dialysis.   pantoprazole 40 MG tablet Commonly known as: PROTONIX Take 1 tablet (40 mg total) by mouth daily.   RENAL VITAMIN PO Take 1 tablet by mouth at bedtime.   Vitamin D3 1.25 MG (50000 UT) Caps Take 1 capsule by mouth once a week.   warfarin 2.5 MG tablet Commonly known as: COUMADIN Take 2.5 mg by mouth See admin instructions. Take on Sun-Tue-Thu-Fri-Sat What changed: Another medication with  the same name was removed. Continue taking this medication, and follow the directions you see here.   warfarin 5 MG tablet Commonly known as: COUMADIN Take 5 mg by mouth daily. Take on Mondays and Wednesdays What changed: Another medication with the same name was removed. Continue taking this medication, and follow the directions you see here.       No orders of the defined types were placed in this encounter.    There is no immunization history on file for this patient.  Social History   Tobacco Use  . Smoking status: Former Smoker    Types: Cigarettes    Quit date: 06/30/2004    Years since quitting: 15.2  . Smokeless tobacco: Never Used  Substance Use Topics  . Alcohol use: No     Vitals:   09/15/19 0908  BP: (!) 122/59  Pulse: 72  Resp: 18  Temp: 98.7 F (37.1 C)   Body mass index is 59.24 kg/m.   Patient Active Problem List   Diagnosis Date Noted  . Infection due to ESBL-producing Escherichia coli 08/12/2019  . MRSA cellulitis 08/12/2019  . Sacral decubitus ulcer, stage IV ([place]) 08/05/2019  . Infected decubitus ulcer, stage IV ([place]) 08/05/2019  . Hypertensive heart and kidney disease with chronic diastolic congestive heart failure and stage 5 chronic kidney disease on chronic dialysis ([place]) 03/24/2019  . 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 Anemia due to end stage renal disease ([place]) 03/24/2019  . Protein-calorie malnutrition, severe ([place]) 03/24/2019  . Hyperlipidemia associated with type 2 diabetes mellitus ([place]) 02/22/2019  . Amputation stump infection ([place]) 02/15/2019  . Conjunctivitis of left eye 01/04/2019  . 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[place]) 12/30/2018  . Open wound of left foot   . Encounter for monitoring Coumadin therapy   . OSA (obstructive sleep apnea)   . HTN (hypertension)   . ESRD on hemodialysis ([place])   . BPH  (benign prostatic hyperplasia)   . Hypotension 12/07/2018  . GERD (gastroesophageal reflux disease) 12/07/2018  . Pressure injury of skin 12/03/2018  . Symptomatic anemia 12/02/2018  . Acute on chronic blood loss anemia 12/02/2018  . 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[place]) 12/02/2018  . Lower GI bleeding 12/02/2018  . Heme positive stool   . Acute on chronic diastolic (congestive) heart failure ([place]) 11/19/2018  . Acute CVA (cerebrovascular accident) ([place]) 11/19/2018  . Hypoglycemia 11/19/2018  . Peripheral vascular disease due to secondary diabetes ([place]) 11/19/2018  . Dry gangrene ([place]) 11/19/2018  . 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 DNR (do not resuscitate) discussion   . Goals of care, counseling/discussion   . Palliative care by specialist   . Acute hypoxemic respiratory failure ([place]) 11/06/2018  . Volume overload 11/06/2018  . Multifocal pneumonia 11/06/2018  . Acute metabolic encephalopathy [id number]  . Physical deconditioning 11/06/2018  . Acute respiratory failure with hypoxia and hypercapnia ([place]) 11/04/2018  . CAP (community acquired pneumonia) 11/04/2018  . Pleural effusion, right 11/04/2018  . Increased ammonia level 11/04/2018  . Abnormal CT of the abdomen 11/04/2018  . Atrial fibrillation with RVR ([place]) 11/04/2018  . Sepsis ([place]) 10/21/2018  . Acute encephalopathy 10/21/2018  . Elevated alkaline phosphatase level 10/21/2018  . Chronic anemia 10/21/2018  . Thrombocytopenia ([place]) 10/21/2018  . Pulmonary HTN ([place]) 03/04/2017  . Aortic valve stenosis 03/04/2017  . Hypertension, accelerated with heart disease, without CHF 01/12/2017  . Dyspnea on exertion 01/12/2017  . ESRD (end stage renal disease) ([place]) 06/07/2012  . Hyperlipidemia 04/24/2011  . HYPERTENSION, BENIGN 10/24/2009  . Type 2 diabetes mellitus with hypertension and end stage renal disease on dialysis ([place]) 10/23/2009  . OBSTRUCTIVE SLEEP APNEA 10/23/2009  . PAF (paroxysmal  atrial fibrillation) ([place]) 10/23/2009  . CHF (congestive heart failure) ([place]) 10/23/2009    CMP     Component Value Date/Time   NA 136 08/16/2019 1818   NA 143 06/06/2019   K 4.0 08/16/2019 1818   CL 97 (L) 08/16/2019 1818   CO2 25 08/16/2019 1818   GLUCOSE 75 08/16/2019 1818   BUN 28 (H) 08/16/2019 1818   BUN 44 (A) 06/06/2019   CREATININE 3.59 (H) 08/16/2019 1818   CALCIUM 9.5 08/16/2019 1818   CALCIUM 8.3 (L) 10/25/2008 1450   PROT 7.8 08/16/2019 1818  ALBUMIN 2.9 (L) 08/16/2019 1818   AST 54 (H) 08/16/2019 1818   ALT 38 08/16/2019 1818   ALKPHOS 229 (H) 08/16/2019 1818   BILITOT 1.2 08/16/2019 1818   GFRNONAA 15 (L) 08/16/2019 1818   GFRAA 18 (L) 08/16/2019 1818   Recent Labs    02/20/19 1300  05/28/19 0253 05/29/19 0541 05/29/19 2316 05/30/19 0727 05/31/19 1319 06/06/19 07/27/19 1529 08/16/19 1818  NA 137   < > 137  --   --  137 136 143 138 136  K 3.0*   < > 3.7  --   --  5.1 3.6 3.7 3.5 4.0  CL 101   < > 102  --   --  101 98  --  95* 97*  CO2 23   < > 21*  --   --  17* 24  --  26 25  GLUCOSE 101*   < > 151*  --   --  99 140*  --  143* 75  BUN 27*   < > 80*  --   --  158* 44* 44* 48* 28*  CREATININE 3.41*   < > 6.12*  --   --  8.32* 4.29* 3.5* 4.61* 3.59*  CALCIUM 7.7*   < > 9.3  --   --  10.0 9.1  --  10.2 9.5  MG  --    < > 2.0 2.1 2.4  --   --   --   --   --   PHOS 2.4*  --   --   --   --  2.6 3.0  --   --   --    < > = values in this interval not displayed.   Recent Labs    05/27/19 0348 05/28/19 0253 05/30/19 0727 05/31/19 1319 08/16/19 1818  AST 55* 31  --   --  54*  ALT 55* 42  --   --  38  ALKPHOS 272* 261*  --   --  229*  BILITOT 0.8 0.6  --   --  1.2  PROT 7.0 6.5  --   --  7.8  ALBUMIN 2.4* 2.2* 2.3* 2.3* 2.9*   Recent Labs    05/31/19 1319 06/06/19 07/27/19 1529 08/16/19 1818  WBC 10.3 11.5 18.6* 6.9  NEUTROABS  --  8 15.5* 4.4  HGB 7.6* 9.3* 12.4* 11.0*  HCT 24.7* 29* 40.2 34.8*  MCV 88.5  --  89.3 89.5  PLT 220 251 171 193    Recent Labs    11/11/18 1517  CHOL 113  LDLCALC 62  TRIG 60   No results found for: [facility] Lab Results  Component Value Date   TSH 1.839 05/25/2019   Lab Results  Component Value Date   HGBA1C 4.9 02/16/2019   Lab Results  Component Value Date   CHOL 113 11/11/2018   HDL 39 (L) 11/11/2018   LDLCALC 62 11/11/2018   TRIG 60 11/11/2018   CHOLHDL 2.9 11/11/2018    Significant Diagnostic Results in last 30 days:  No results found.  Assessment and Plan  Atrial fibrillation/on chronic Coumadin- INR today is 2.0 which is therapeutic on Coumadin 5 mg Monday Wednesday and 2.5 mg every other day.  Plan to continue the same regimen and follow-up in 1 week     Hennie Duos , MD

## 2019-09-20 ENCOUNTER — Encounter: Payer: Self-pay | Admitting: Internal Medicine

## 2019-09-27 ENCOUNTER — Other Ambulatory Visit: Payer: Self-pay | Admitting: Internal Medicine

## 2019-09-27 MED ORDER — HYDROCODONE-ACETAMINOPHEN 5-325 MG PO TABS: 1.0000 | ORAL_TABLET | Freq: Two times a day (BID) | ORAL | 0 refills | Status: DC

## 2019-10-03 ENCOUNTER — Non-Acute Institutional Stay (SKILLED_NURSING_FACILITY): Payer: Medicare Other | Admitting: Internal Medicine

## 2019-10-03 DIAGNOSIS — Z7901 Long term (current) use of anticoagulants: Secondary | ICD-10-CM | POA: Diagnosis not present

## 2019-10-03 DIAGNOSIS — I480 Paroxysmal atrial fibrillation: Secondary | ICD-10-CM | POA: Diagnosis not present

## 2019-10-03 LAB — CBC AND DIFFERENTIAL
HCT: 39 — AB (ref 41–53)
Hemoglobin: 12.8 — AB (ref 13.5–17.5)
Platelets: 131 — AB (ref 150–399)
WBC: 6.3

## 2019-10-03 LAB — CBC: RBC: 3.91 (ref 3.87–5.11)

## 2019-10-15 ENCOUNTER — Encounter: Payer: Self-pay | Admitting: Internal Medicine

## 2019-10-15 NOTE — Progress Notes (Signed)
Location:      Place of Service:     Edward Duos, MD  Patient Care Team: Edward Duos, MD as PCP - General (Internal Medicine) [name], MD as Consulting Physician (Nephrology) Rehab, [place] ([place], [facility]  Extended Emergency Contact Information Primary Emergency Contact: [name],[name] Address: [place], [place] [address] [name] of [place] Phone: [phone] Work Phone: [phone] Mobile Phone: [phone] Relation: Daughter Secondary Emergency Contact: [name] Mobile Phone: [phone] Relation: Other    Allergies: Occlusive silicone sheets [silicone], Other, and Tape  Chief Complaint  Patient presents with  . Acute Visit    HPI: Patient is 78 y.o. male who is on Coumadin for paroxysmal atrial fibrillation and who is being seen for his INR check.  Patient has no complaints.  Past Medical History:  Diagnosis Date  . A-fib ([place])   . Anemia   . Blood transfusion   . BPH (benign prostatic hyperplasia)   . CHF (congestive heart failure) ([place])   . Diarrhea   . DM (diabetes mellitus) ([place])   . ESRD on hemodialysis ([place])    Started dialysis in 2009  . History of GI bleed    secondary to coumadin  . HTN (hypertension)   . Hyperlipidemia   . OSA (obstructive sleep apnea)    uses CPAP  . Secondary hyperparathyroidism of renal origin [facility])     Past Surgical History:  Procedure Laterality Date  . ABDOMINAL AORTOGRAM N/A 11/15/2018   Procedure: ABDOMINAL AORTOGRAM;  Surgeon: [name], MD;  Location: [facility];  Service: Cardiovascular;  Laterality: N/A;  . AMPUTATION Left 12/06/2018   Procedure: AMPUTATION DIGIT LEFT FIFTH TOE;  Surgeon: Angelia Mould, MD;  Location: [facility];  Service: Vascular;  Laterality: Left;  . AMPUTATION Bilateral 12/29/2018   Procedure: AMPUTATION ABOVE KNEE;  Surgeon: Waynetta Sandy, MD;   Location: [place];  Service: Vascular;  Laterality: Bilateral;  . APPLICATION OF WOUND VAC Right 02/17/2019   Procedure: Application Of Wound Vac;  Surgeon: Marty Heck, MD;  Location: [facility];  Service: Vascular;  Laterality: Right;  . BVT  [id number]   Left  Basilic Vein Transposition  . CHOLECYSTECTOMY    . EYE SURGERY     Catarct bil  . I&D EXTREMITY Right 02/17/2019   Procedure: Right above the kneee debridement;  Surgeon: Marty Heck, MD;  Location: [facility];  Service: Vascular;  Laterality: Right;  . INSERTION OF DIALYSIS CATHETER  05/28/2012   Procedure: INSERTION OF DIALYSIS CATHETER;  Surgeon: Mal Misty, MD;  Location: [facility];  Service: Vascular;  Laterality: Right;  . Left arm shuntogram.    . Left forearm loop graft with 6 mm Gore-Tex graft.    . LOWER EXTREMITY ANGIOGRAPHY Bilateral 11/15/2018   Procedure: LOWER EXTREMITY ANGIOGRAPHY;  Surgeon: [name], MD;  Location: [facility];  Service: Cardiovascular;  Laterality: Bilateral;  . Pars plana vitrectomy with 25-gauge system    . PERIPHERAL VASCULAR ATHERECTOMY Left 11/15/2018   Procedure: PERIPHERAL VASCULAR ATHERECTOMY;  Surgeon: [name], MD;  Location: [facility];  Service: Cardiovascular;  Laterality: Left;  SFA with STENT  . PERIPHERAL VASCULAR BALLOON ANGIOPLASTY Left 11/15/2018   Procedure: PERIPHERAL VASCULAR BALLOON ANGIOPLASTY;  Surgeon: [name], MD;  Location: [facility];  Service: Cardiovascular;  Laterality: Left;  PT  Allergies as of 10/03/2019      Reactions   Occlusive Silicone Sheets [silicone] Other (See Comments)   "Allergic," per [facility]   Other Other (See Comments)   Unknown reaction to Occlusive adhesive- "Allergic," per [facility]   Tape Itching, Other (See Comments)   Use Cloth tape only, please      Medication List       Accurate as of October 03, 2019 11:59 PM. If you have any questions, ask your nurse or doctor.        acetaminophen 500 MG  tablet Commonly known as: TYLENOL Take 1,000 mg by mouth 2 (two) times daily.   ADULT NUTRITIONAL SUPPLEMENT PO Take 1 each by mouth daily. Magic Cup with dinner   atorvastatin 20 MG tablet Commonly known as: LIPITOR Take 20 mg by mouth daily.   calcium acetate 667 MG capsule Commonly known as: PHOSLO Take 1,334 mg by mouth 3 (three) times daily with meals.   dicyclomine 20 MG tablet Commonly known as: BENTYL Take 20 mg by mouth 3 (three) times daily before meals.   feeding supplement (PRO-STAT SUGAR FREE 64) Liqd Take 30 mLs by mouth 2 (two) times daily. 9a and 5p   Hectorol 4 MCG/2ML injection Generic drug: doxercalciferol Inject 2 mcg into the vein every Monday, Wednesday, and Friday with hemodialysis.   HYDROcodone-acetaminophen 5-325 MG tablet Commonly known as: Norco Take 1 tablet by mouth every 12 (twelve) hours.   loperamide 2 MG tablet Commonly known as: IMODIUM A-D Take 4 mg by mouth See admin instructions. Take 2 tablets ( 4 mg) by mouth after 1st loose stool as needed for diarrhea   metoprolol tartrate 25 MG tablet Commonly known as: LOPRESSOR Take 25 mg by mouth See admin instructions. Take 25 mg by mouth two times a day on Sun/Tues/Thurs/Sat and 25 mg at bedtime on Mon/Wed/Fri   midodrine 10 MG tablet Commonly known as: PROAMATINE Take one tablet (10 mg) by mouth on Monday, Wednesday, Friday 30 minutes prior to dialysis.   pantoprazole 40 MG tablet Commonly known as: PROTONIX Take 1 tablet (40 mg total) by mouth daily.   RENAL VITAMIN PO Take 1 tablet by mouth at bedtime.   Vitamin D3 1.25 MG (50000 UT) Caps Take 1 capsule by mouth once a week.   warfarin 2.5 MG tablet Commonly known as: COUMADIN Take 2.5 mg by mouth See admin instructions. Take on Sun-Tue-Thu-Fri-Sat   warfarin 5 MG tablet Commonly known as: COUMADIN Take 5 mg by mouth daily. Take on Mondays and Wednesdays       No orders of the defined types were placed in this encounter.     There is no immunization history on file for this patient.  Social History   Tobacco Use  . Smoking status: Former Smoker    Types: Cigarettes    Quit date: 06/30/2004    Years since quitting: 15.3  . Smokeless tobacco: Never Used  Substance Use Topics  . Alcohol use: No     Vitals:   10/15/19 2310  BP: (!) 142/81  Pulse: 81  Resp: 18  Temp: 97.9 F (36.6 C)   There is no height or weight on file to calculate BMI.   Patient Active Problem List   Diagnosis Date Noted  . Infection due to ESBL-producing Escherichia coli 08/12/2019  . MRSA cellulitis 08/12/2019  . Sacral decubitus ulcer, stage IV ([place]) 08/05/2019  . Infected decubitus ulcer, stage IV ([place]) 08/05/2019  . Hypertensive heart and kidney disease  with chronic diastolic congestive heart failure and stage 5 chronic kidney disease on chronic dialysis ([place]) 03/24/2019  . End stage renal disease on dialysis due to type 2 diabetes mellitus ([place]) 03/24/2019  . 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 Anemia due to end stage renal disease ([place]) 03/24/2019  . Protein-calorie malnutrition, severe ([place]) 03/24/2019  . Hyperlipidemia associated with type 2 diabetes mellitus ([place]) 02/22/2019  . Amputation stump infection ([place]) 02/15/2019  . Conjunctivitis of left eye 01/04/2019  . 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[place]) 12/30/2018  . Open wound of left foot   . Encounter for monitoring Coumadin therapy   . OSA (obstructive sleep apnea)   . HTN (hypertension)   . ESRD on hemodialysis ([place])   . BPH (benign prostatic hyperplasia)   . Hypotension 12/07/2018  . GERD (gastroesophageal reflux disease) 12/07/2018  . Pressure injury of skin 12/03/2018  . Symptomatic anemia 12/02/2018  . Acute on chronic blood loss anemia 12/02/2018  . 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[place]) 12/02/2018  . Lower GI bleeding 12/02/2018  . Heme positive stool   . Acute on chronic diastolic (congestive) heart  failure ([place]) 11/19/2018  . Acute CVA (cerebrovascular accident) ([place]) 11/19/2018  . Hypoglycemia 11/19/2018  . Peripheral vascular disease due to secondary diabetes ([place]) 11/19/2018  . Dry gangrene ([place]) 11/19/2018  . 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 DNR (do not resuscitate) discussion   . Goals of care, counseling/discussion   . Palliative care by specialist   . Acute hypoxemic respiratory failure ([place]) 11/06/2018  . Volume overload 11/06/2018  . Multifocal pneumonia 11/06/2018  . Acute metabolic encephalopathy [id number]  . Physical deconditioning 11/06/2018  . Acute respiratory failure with hypoxia and hypercapnia ([place]) 11/04/2018  . CAP (community acquired pneumonia) 11/04/2018  . Pleural effusion, right 11/04/2018  . Increased ammonia level 11/04/2018  . Abnormal CT of the abdomen 11/04/2018  . Atrial fibrillation with RVR ([place]) 11/04/2018  . Sepsis ([place]) 10/21/2018  . Acute encephalopathy 10/21/2018  . Elevated alkaline phosphatase level 10/21/2018  . Chronic anemia 10/21/2018  . Thrombocytopenia ([place]) 10/21/2018  . Pulmonary HTN ([place]) 03/04/2017  . Aortic valve stenosis 03/04/2017  . Hypertension, accelerated with heart disease, without CHF 01/12/2017  . Dyspnea on exertion 01/12/2017  . ESRD (end stage renal disease) ([place]) 06/07/2012  . Hyperlipidemia 04/24/2011  . HYPERTENSION, BENIGN 10/24/2009  . Type 2 diabetes mellitus with hypertension and end stage renal disease on dialysis ([place]) 10/23/2009  . OBSTRUCTIVE SLEEP APNEA 10/23/2009  . PAF (paroxysmal atrial fibrillation) ([place]) 10/23/2009  . CHF (congestive heart failure) ([place]) 10/23/2009    CMP     Component Value Date/Time   NA 136 08/16/2019 1818   NA 143 06/06/2019   K 4.0 08/16/2019 1818   CL 97 (L) 08/16/2019 1818   CO2 25 08/16/2019 1818   GLUCOSE 75 08/16/2019 1818   BUN 28 (H) 08/16/2019 1818   BUN 44 (A) 06/06/2019   CREATININE 3.59 (H) 08/16/2019 1818   CALCIUM 9.5  08/16/2019 1818   CALCIUM 8.3 (L) 10/25/2008 1450   PROT 7.8 08/16/2019 1818   ALBUMIN 2.9 (L) 08/16/2019 1818   AST 54 (H) 08/16/2019 1818   ALT 38 08/16/2019 1818   ALKPHOS 229 (H) 08/16/2019 1818   BILITOT 1.2 08/16/2019 1818   GFRNONAA 15 (L) 08/16/2019 1818   GFRAA 18 (L) 08/16/2019 1818   Recent Labs    02/20/19 1300  05/28/19 0253 05/29/19 0541 05/29/19 2316  05/30/19 0727 05/31/19 1319 06/06/19 07/27/19 1529 08/16/19 1818  NA 137   < > 137  --   --  137 136 143 138 136  K 3.0*   < > 3.7  --   --  5.1 3.6 3.7 3.5 4.0  CL 101   < > 102  --   --  101 98  --  95* 97*  CO2 23   < > 21*  --   --  17* 24  --  26 25  GLUCOSE 101*   < > 151*  --   --  99 140*  --  143* 75  BUN 27*   < > 80*  --   --  158* 44* 44* 48* 28*  CREATININE 3.41*   < > 6.12*  --   --  8.32* 4.29* 3.5* 4.61* 3.59*  CALCIUM 7.7*   < > 9.3  --   --  10.0 9.1  --  10.2 9.5  MG  --    < > 2.0 2.1 2.4  --   --   --   --   --   PHOS 2.4*  --   --   --   --  2.6 3.0  --   --   --    < > = values in this interval not displayed.   Recent Labs    05/27/19 0348 05/28/19 0253 05/30/19 0727 05/31/19 1319 08/16/19 1818  AST 55* 31  --   --  54*  ALT 55* 42  --   --  38  ALKPHOS 272* 261*  --   --  229*  BILITOT 0.8 0.6  --   --  1.2  PROT 7.0 6.5  --   --  7.8  ALBUMIN 2.4* 2.2* 2.3* 2.3* 2.9*   Recent Labs    05/31/19 1319 06/06/19 07/27/19 1529 08/16/19 1818  WBC 10.3 11.5 18.6* 6.9  NEUTROABS  --  8 15.5* 4.4  HGB 7.6* 9.3* 12.4* 11.0*  HCT 24.7* 29* 40.2 34.8*  MCV 88.5  --  89.3 89.5  PLT 220 251 171 193   Recent Labs    11/11/18 1517  CHOL 113  LDLCALC 62  TRIG 60   No results found for: [facility] Lab Results  Component Value Date   TSH 1.839 05/25/2019   Lab Results  Component Value Date   HGBA1C 4.9 02/16/2019   Lab Results  Component Value Date   CHOL 113 11/11/2018   HDL 39 (L) 11/11/2018   LDLCALC 62 11/11/2018   TRIG 60 11/11/2018   CHOLHDL 2.9 11/11/2018     Significant Diagnostic Results in last 30 days:  No results found.  Assessment and Plan  Paroxysmal atrial fibrillation/chronic anticoagulation with Coumadin-patient's INR last week was 1.9.  INR this week is 2.5 on Coumadin 2.5 mg every day except for Monday and Wednesday when he gets 5 mg.  We will continue current regimen and recheck in 1 week.    [name] , MD

## 2019-10-16 ENCOUNTER — Non-Acute Institutional Stay (SKILLED_NURSING_FACILITY): Payer: Medicare Other | Admitting: Internal Medicine

## 2019-10-16 DIAGNOSIS — I5032 Chronic diastolic (congestive) heart failure: Secondary | ICD-10-CM

## 2019-10-16 DIAGNOSIS — Z992 Dependence on renal dialysis: Secondary | ICD-10-CM

## 2019-10-16 DIAGNOSIS — I132 Hypertensive heart and chronic kidney disease with heart failure and with stage 5 chronic kidney disease, or end stage renal disease: Secondary | ICD-10-CM | POA: Diagnosis not present

## 2019-10-16 DIAGNOSIS — K219 Gastro-esophageal reflux disease without esophagitis: Secondary | ICD-10-CM

## 2019-10-16 DIAGNOSIS — N186 End stage renal disease: Secondary | ICD-10-CM

## 2019-10-16 NOTE — Progress Notes (Signed)
Location:  [place] Room Number: 414-D Place of Service:  SNF (31)  Edward Duos, MD  Patient Care Team: Edward Duos, MD as PCP - General (Internal Medicine) [name], MD as Consulting Physician (Nephrology) Rehab, [facility], [facility]  Extended Emergency Contact Information Primary Emergency Contact: [name],[name] Address: [place], [place] [address] [name] Phone: [phone] Work Phone: [phone] Mobile Phone: [phone] Relation: Daughter Secondary Emergency Contact: [name] Mobile Phone: [phone] Relation: Other    Allergies: Occlusive silicone sheets [silicone], Other, and Tape  Chief Complaint  Patient presents with  . Medical Management of Chronic Issues    Routine [facility] visit    HPI: Patient is a 78 y.o. male who is being seen for routine issues of chronic diastolic congestive heart failure, hypertension, and GERD.  Past Medical History:  Diagnosis Date  . A-fib ([place])   . Anemia   . Blood transfusion   . BPH (benign prostatic hyperplasia)   . CHF (congestive heart failure) ([place])   . Diarrhea   . DM (diabetes mellitus) ([place])   . ESRD on hemodialysis ([place])    Started dialysis in 2009  . History of GI bleed    secondary to coumadin  . HTN (hypertension)   . Hyperlipidemia   . OSA (obstructive sleep apnea)    uses CPAP  . Secondary hyperparathyroidism of renal origin [facility])     Past Surgical History:  Procedure Laterality Date  . ABDOMINAL AORTOGRAM N/A 11/15/2018   Procedure: ABDOMINAL AORTOGRAM;  Surgeon: [name], MD;  Location: [place] CV LAB;  Service: Cardiovascular;  Laterality: N/A;  . AMPUTATION Left 12/06/2018   Procedure: AMPUTATION DIGIT LEFT FIFTH TOE;  Surgeon: Angelia Mould, MD;  Location: [place];  Service: Vascular;  Laterality: Left;  .  AMPUTATION Bilateral 12/29/2018   Procedure: AMPUTATION ABOVE KNEE;  Surgeon: Waynetta Sandy, MD;  Location: [place];  Service: Vascular;  Laterality: Bilateral;  . APPLICATION OF WOUND VAC Right 02/17/2019   Procedure: Application Of Wound Vac;  Surgeon: Marty Heck, MD;  Location: [place];  Service: Vascular;  Laterality: Right;  . BVT  [id number]   Left  Basilic Vein Transposition  . CHOLECYSTECTOMY    . EYE SURGERY     Catarct bil  . I&D EXTREMITY Right 02/17/2019   Procedure: Right above the kneee debridement;  Surgeon: Marty Heck, MD;  Location: [place];  Service: Vascular;  Laterality: Right;  . INSERTION OF DIALYSIS CATHETER  05/28/2012   Procedure: INSERTION OF DIALYSIS CATHETER;  Surgeon: Mal Misty, MD;  Location: [place];  Service: Vascular;  Laterality: Right;  . Left arm shuntogram.    . Left forearm loop graft with 6 mm Gore-Tex graft.    . LOWER EXTREMITY ANGIOGRAPHY Bilateral 11/15/2018   Procedure: LOWER EXTREMITY ANGIOGRAPHY;  Surgeon: [name], MD;  Location: [place] CV LAB;  Service: Cardiovascular;  Laterality: Bilateral;  . Pars plana vitrectomy with 25-gauge system    . PERIPHERAL VASCULAR ATHERECTOMY Left 11/15/2018   Procedure: PERIPHERAL VASCULAR ATHERECTOMY;  Surgeon: [name], MD;  Location: [place] CV LAB;  Service: Cardiovascular;  Laterality: Left;  SFA with STENT  . PERIPHERAL VASCULAR BALLOON ANGIOPLASTY Left 11/15/2018   Procedure: PERIPHERAL VASCULAR BALLOON ANGIOPLASTY;  Surgeon: [name], MD;  Location: [facility]  INVASIVE CV LAB;  Service: Cardiovascular;  Laterality: Left;  PT    Allergies as of 10/16/2019      Reactions   Occlusive Silicone Sheets [silicone] Other (See Comments)   "Allergic," per [facility]   Other Other (See Comments)   Unknown reaction to Occlusive adhesive- "Allergic," per [facility]   Tape Itching, Other (See Comments)   Use Cloth tape only, please      Medication List       Accurate as of  October 16, 2019 11:59 PM. If you have any questions, ask your nurse or doctor.        acetaminophen 500 MG tablet Commonly known as: TYLENOL Take 1,000 mg by mouth 2 (two) times daily.   ADULT NUTRITIONAL SUPPLEMENT PO Take 1 each by mouth daily. Magic Cup with dinner   atorvastatin 20 MG tablet Commonly known as: LIPITOR Take 20 mg by mouth daily.   calcium acetate 667 MG capsule Commonly known as: PHOSLO Take 1,334 mg by mouth 3 (three) times daily with meals.   dicyclomine 20 MG tablet Commonly known as: BENTYL Take 20 mg by mouth 3 (three) times daily before meals.   feeding supplement (PRO-STAT SUGAR FREE 64) Liqd Take 30 mLs by mouth 2 (two) times daily. 9a and 5p   Hectorol 4 MCG/2ML injection Generic drug: doxercalciferol Inject 2 mcg into the vein every Monday, Wednesday, and Friday with hemodialysis.   HYDROcodone-acetaminophen 5-325 MG tablet Commonly known as: Norco Take 1 tablet by mouth every 12 (twelve) hours.   loperamide 2 MG tablet Commonly known as: IMODIUM A-D Take 4 mg by mouth See admin instructions. Take 2 tablets ( 4 mg) by mouth after 1st loose stool as needed for diarrhea   metoprolol tartrate 25 MG tablet Commonly known as: LOPRESSOR Take 25 mg by mouth See admin instructions. Take 25 mg by mouth two times a day on Sun/Tues/Thurs/Sat and 25 mg at bedtime on Mon/Wed/Fri   midodrine 10 MG tablet Commonly known as: PROAMATINE Take one tablet (10 mg) by mouth on Monday, Wednesday, Friday 30 minutes prior to dialysis.   pantoprazole 40 MG tablet Commonly known as: PROTONIX Take 1 tablet (40 mg total) by mouth daily.   RENAL VITAMIN PO Take 1 tablet by mouth at bedtime.   Vitamin D3 1.25 MG (50000 UT) Caps Take 1 capsule by mouth once a week.   warfarin 2.5 MG tablet Commonly known as: COUMADIN Take 2.5 mg by mouth See admin instructions. Take on Sun-Tue-Thu-Fri-Sat   warfarin 5 MG tablet Commonly known as: COUMADIN Take 5 mg by  mouth daily. Take on Mondays and Wednesdays       No orders of the defined types were placed in this encounter.   Immunization History  Administered Date(s) Administered  . Influenza-Unspecified 08/29/2019  . Pneumococcal Polysaccharide-23 10/07/2019    Social History   Tobacco Use  . Smoking status: Former Smoker    Types: Cigarettes    Quit date: 06/30/2004    Years since quitting: 15.3  . Smokeless tobacco: Never Used  Substance Use Topics  . Alcohol use: No    Review of Systems  DATA OBTAINED: from patient-limited; nursing-no acute concerns GENERAL:  no fevers, fatigue, appetite changes SKIN: No itching, rash HEENT: No complaint RESPIRATORY: No cough, wheezing, SOB CARDIAC: No chest pain, palpitations, lower extremity edema  GI: No abdominal pain, No N/V/D or constipation, No heartburn or reflux  GU: No dysuria, frequency or urgency, or incontinence  MUSCULOSKELETAL: No unrelieved bone/joint  pain NEUROLOGIC: No headache, dizziness  PSYCHIATRIC: No overt anxiety or sadness  Vitals:   10/16/19 1457  BP: 127/68  Pulse: 63  Resp: 18  Temp: 98 F (36.7 C)   Body mass index is 62.28 kg/m. Physical Exam  GENERAL APPEARANCE: Alert, conversant, No acute distress  SKIN: No diaphoresis rash HEENT: Unremarkable RESPIRATORY: Breathing is even, unlabored. Lung sounds are clear   CARDIOVASCULAR: Heart RRR no murmurs, rubs or gallops. No peripheral edema  GASTROINTESTINAL: Abdomen is soft, non-tender, not distended w/ normal bowel sounds.  GENITOURINARY: Bladder non tender, not distended  MUSCULOSKELETAL: Bilateral AKA NEUROLOGIC: Cranial nerves 2-12 grossly intact. Moves all extremities PSYCHIATRIC: Mood and affect appropriate with dementia, no behavioral issues  Patient Active Problem List   Diagnosis Date Noted  . Infection due to ESBL-producing Escherichia coli 08/12/2019  . MRSA cellulitis 08/12/2019  . Sacral decubitus ulcer, stage IV ([place]) 08/05/2019  .  Infected decubitus ulcer, stage IV ([place]) 08/05/2019  . 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 End stage renal disease on dialysis due to type 2 diabetes mellitus ([place]) 03/24/2019  . 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 Anemia due to end stage renal disease ([place]) 03/24/2019  . Protein-calorie malnutrition, severe ([place]) 03/24/2019  . Hyperlipidemia associated with type 2 diabetes mellitus ([place]) 02/22/2019  . Amputation stump infection ([place]) 02/15/2019  . Conjunctivitis of left eye 01/04/2019  . 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[place]) 12/30/2018  . Open wound of left foot   . Encounter for monitoring Coumadin therapy   . OSA (obstructive sleep apnea)   . HTN (hypertension)   . ESRD on hemodialysis ([place])   . BPH (benign prostatic hyperplasia)   . Hypotension 12/07/2018  . GERD (gastroesophageal reflux disease) 12/07/2018  . Pressure injury of skin 12/03/2018  . Symptomatic anemia 12/02/2018  . Acute on chronic blood loss anemia 12/02/2018  . 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[place]) 12/02/2018  . Lower GI bleeding 12/02/2018  . Heme positive stool   . Acute on chronic diastolic (congestive) heart failure ([place]) 11/19/2018  . Acute CVA (cerebrovascular accident) ([place]) 11/19/2018  . Hypoglycemia 11/19/2018  . Peripheral vascular disease due to secondary diabetes ([place]) 11/19/2018  . Dry gangrene ([place]) 11/19/2018  . 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 DNR (do not resuscitate) discussion   . Goals of care, counseling/discussion   . Palliative care by specialist   . Acute hypoxemic respiratory failure ([place]) 11/06/2018  . Volume overload 11/06/2018  . Multifocal pneumonia 11/06/2018  . Acute metabolic encephalopathy [id number]  . Physical deconditioning 11/06/2018  . Acute respiratory failure with hypoxia and  hypercapnia ([place]) 11/04/2018  . CAP (community acquired pneumonia) 11/04/2018  . Pleural effusion, right 11/04/2018  . Increased ammonia level 11/04/2018  . Abnormal CT of the abdomen 11/04/2018  . Atrial fibrillation with RVR ([place]) 11/04/2018  . Sepsis ([place]) 10/21/2018  . Acute encephalopathy 10/21/2018  . Elevated alkaline phosphatase level 10/21/2018  . Chronic anemia 10/21/2018  . Thrombocytopenia ([place]) 10/21/2018  . Pulmonary HTN ([place]) 03/04/2017  . Aortic valve stenosis 03/04/2017  . Hypertension, accelerated with heart disease, without CHF 01/12/2017  . Dyspnea on exertion 01/12/2017  . ESRD (end stage renal disease) ([place]) 06/07/2012  . Hyperlipidemia 04/24/2011  . HYPERTENSION, BENIGN 10/24/2009  . Type 2 diabetes mellitus with hypertension and end stage renal disease on dialysis ([place]) 10/23/2009  .  OBSTRUCTIVE SLEEP APNEA 10/23/2009  . PAF (paroxysmal atrial fibrillation) ([place]) 10/23/2009  . CHF (congestive heart failure) ([place]) 10/23/2009    CMP     Component Value Date/Time   NA 136 08/16/2019 1818   NA 143 06/06/2019   K 4.0 08/16/2019 1818   CL 97 (L) 08/16/2019 1818   CO2 25 08/16/2019 1818   GLUCOSE 75 08/16/2019 1818   BUN 28 (H) 08/16/2019 1818   BUN 44 (A) 06/06/2019   CREATININE 3.59 (H) 08/16/2019 1818   CALCIUM 9.5 08/16/2019 1818   CALCIUM 8.3 (L) 10/25/2008 1450   PROT 7.8 08/16/2019 1818   ALBUMIN 2.9 (L) 08/16/2019 1818   AST 54 (H) 08/16/2019 1818   ALT 38 08/16/2019 1818   ALKPHOS 229 (H) 08/16/2019 1818   BILITOT 1.2 08/16/2019 1818   GFRNONAA 15 (L) 08/16/2019 1818   GFRAA 18 (L) 08/16/2019 1818   Recent Labs    02/20/19 1300  05/28/19 0253 05/29/19 0541 05/29/19 2316 05/30/19 0727 05/31/19 1319 06/06/19 07/27/19 1529 08/16/19 1818  NA 137   < > 137  --   --  137 136 143 138 136  K 3.0*   < > 3.7  --   --  5.1 3.6 3.7 3.5 4.0  CL 101   < > 102  --   --  101 98  --  95* 97*  CO2 23   < > 21*  --   --  17* 24  --  26 25  GLUCOSE  101*   < > 151*  --   --  99 140*  --  143* 75  BUN 27*   < > 80*  --   --  158* 44* 44* 48* 28*  CREATININE 3.41*   < > 6.12*  --   --  8.32* 4.29* 3.5* 4.61* 3.59*  CALCIUM 7.7*   < > 9.3  --   --  10.0 9.1  --  10.2 9.5  MG  --    < > 2.0 2.1 2.4  --   --   --   --   --   PHOS 2.4*  --   --   --   --  2.6 3.0  --   --   --    < > = values in this interval not displayed.   Recent Labs    05/27/19 0348 05/28/19 0253 05/30/19 0727 05/31/19 1319 08/16/19 1818  AST 55* 31  --   --  54*  ALT 55* 42  --   --  38  ALKPHOS 272* 261*  --   --  229*  BILITOT 0.8 0.6  --   --  1.2  PROT 7.0 6.5  --   --  7.8  ALBUMIN 2.4* 2.2* 2.3* 2.3* 2.9*   Recent Labs    05/31/19 1319 06/06/19 07/27/19 1529 08/16/19 1818  WBC 10.3 11.5 18.6* 6.9  NEUTROABS  --  8 15.5* 4.4  HGB 7.6* 9.3* 12.4* 11.0*  HCT 24.7* 29* 40.2 34.8*  MCV 88.5  --  89.3 89.5  PLT 220 251 171 193   Recent Labs    11/11/18 1517  CHOL 113  LDLCALC 62  TRIG 60   No results found for: [facility] Lab Results  Component Value Date   TSH 1.839 05/25/2019   Lab Results  Component Value Date   HGBA1C 4.7 09/08/2019   Lab Results  Component Value Date   CHOL 113 11/11/2018  HDL 39 (L) 11/11/2018   LDLCALC 62 11/11/2018   TRIG 60 11/11/2018   CHOLHDL 2.9 11/11/2018    Significant Diagnostic Results in last 30 days:  No results found.  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

## 2019-10-17 ENCOUNTER — Encounter: Payer: Self-pay | Admitting: Internal Medicine

## 2019-10-21 ENCOUNTER — Encounter: Payer: Self-pay | Admitting: Internal Medicine

## 2019-10-21 NOTE — Assessment & Plan Note (Signed)
No reported problems; continue Protonix 40 mg daily 

## 2019-10-21 NOTE — Assessment & Plan Note (Signed)
Stable; continue metoprolol 25 mg twice daily on non dialysis days; continue midodrine 10 mg before dialysis on dialysis days

## 2019-10-21 NOTE — Assessment & Plan Note (Signed)
No reported problems; continue metoprolol 25 mg twice daily on nondialysis days; fluid status is controlled by dialysis

## 2019-10-26 ENCOUNTER — Non-Acute Institutional Stay (SKILLED_NURSING_FACILITY): Payer: Medicare Other | Admitting: Internal Medicine

## 2019-10-26 ENCOUNTER — Other Ambulatory Visit: Payer: Self-pay | Admitting: Internal Medicine

## 2019-10-26 DIAGNOSIS — I480 Paroxysmal atrial fibrillation: Secondary | ICD-10-CM | POA: Diagnosis not present

## 2019-10-26 DIAGNOSIS — Z7901 Long term (current) use of anticoagulants: Secondary | ICD-10-CM | POA: Diagnosis not present

## 2019-10-26 DIAGNOSIS — Z5181 Encounter for therapeutic drug level monitoring: Secondary | ICD-10-CM | POA: Diagnosis not present

## 2019-10-26 MED ORDER — HYDROCODONE-ACETAMINOPHEN 5-325 MG PO TABS: 1.0000 | ORAL_TABLET | Freq: Two times a day (BID) | ORAL | 0 refills | Status: DC

## 2019-10-27 ENCOUNTER — Encounter: Payer: Self-pay | Admitting: Internal Medicine

## 2019-10-27 ENCOUNTER — Non-Acute Institutional Stay (SKILLED_NURSING_FACILITY): Payer: Medicare Other | Admitting: Internal Medicine

## 2019-10-27 DIAGNOSIS — I480 Paroxysmal atrial fibrillation: Secondary | ICD-10-CM

## 2019-10-27 DIAGNOSIS — F02818 Dementia in other diseases classified elsewhere, unspecified severity, with other behavioral disturbance: Secondary | ICD-10-CM

## 2019-10-27 DIAGNOSIS — F0281 Dementia in other diseases classified elsewhere with behavioral disturbance: Secondary | ICD-10-CM

## 2019-10-27 DIAGNOSIS — G300 Alzheimer's disease with early onset: Secondary | ICD-10-CM | POA: Diagnosis not present

## 2019-10-27 DIAGNOSIS — Z7901 Long term (current) use of anticoagulants: Secondary | ICD-10-CM

## 2019-10-27 LAB — POCT INR: INR: 1.9 — AB (ref 0.9–1.1)

## 2019-10-27 LAB — PROTIME-INR: Protime: 22 — AB (ref 10.0–13.8)

## 2019-10-27 NOTE — Progress Notes (Signed)
Location:  [place] [facility] Room Number: 414-D Place of Service:  SNF (31)  Edward Duos, MD  Patient Care Team: Edward Duos, MD as PCP - General (Internal Medicine) [name], MD as Consulting Physician (Nephrology) Rehab, [place] [facility], [facility]  Extended Emergency Contact Information Primary Emergency Contact: [name],[name] Address: [place], [place] [address] [name] Phone: [phone] Work Phone: [phone] Mobile Phone: [phone] Relation: Daughter Secondary Emergency Contact: [name] Mobile Phone: [phone] Relation: Other    Allergies: Occlusive silicone sheets [silicone], Other, and Tape  Chief Complaint  Patient presents with   Acute Visit    Patient is seen due to behaviors.    HPI: Patient is a 78 y.o. male who the DO and asked me to see for behaviors.  Patient has been taking his clothes off, climbing out of bed thinking he can walk (patient has bilateral AKA) and masturbating in from the nurses and making lewd remarks.  This is all worsened in the last 2 to 3 days.  Patient is also being seen for his INR check his INR today is 2.3  Past Medical History:  Diagnosis Date   A-fib ([place])    Anemia    Blood transfusion    BPH (benign prostatic hyperplasia)    CHF (congestive heart failure) ([place])    Diarrhea    DM (diabetes mellitus) ([place])    ESRD on hemodialysis ([place])    Started dialysis in 2009   History of GI bleed    secondary to coumadin   HTN (hypertension)    Hyperlipidemia    OSA (obstructive sleep apnea)    uses CPAP   Secondary hyperparathyroidism of renal origin [facility])     Past Surgical History:  Procedure Laterality Date   ABDOMINAL AORTOGRAM N/A 11/15/2018   Procedure: ABDOMINAL AORTOGRAM;  Surgeon: [name], MD;  Location: [place] CV LAB;  Service: Cardiovascular;   Laterality: N/A;   AMPUTATION Left 12/06/2018   Procedure: AMPUTATION DIGIT LEFT FIFTH TOE;  Surgeon: Angelia Mould, MD;  Location: [place];  Service: Vascular;  Laterality: Left;   AMPUTATION Bilateral 12/29/2018   Procedure: AMPUTATION ABOVE KNEE;  Surgeon: Waynetta Sandy, MD;  Location: [place];  Service: Vascular;  Laterality: Bilateral;   APPLICATION OF WOUND VAC Right 02/17/2019   Procedure: Application Of Wound Vac;  Surgeon: Marty Heck, MD;  Location: [place];  Service: Vascular;  Laterality: Right;   BVT  [id number]   Left  Basilic Vein Transposition   CHOLECYSTECTOMY     EYE SURGERY     Catarct bil   I&D EXTREMITY Right 02/17/2019   Procedure: Right above the kneee debridement;  Surgeon: Marty Heck, MD;  Location: [place];  Service: Vascular;  Laterality: Right;   INSERTION OF DIALYSIS CATHETER  05/28/2012   Procedure: INSERTION OF DIALYSIS CATHETER;  Surgeon: Mal Misty, MD;  Location: [facility] OR;  Service: Vascular;  Laterality: Right;   Left arm shuntogram.     Left forearm loop graft with 6 mm Gore-Tex graft.     LOWER EXTREMITY ANGIOGRAPHY Bilateral 11/15/2018   Procedure: LOWER EXTREMITY ANGIOGRAPHY;  Surgeon: [name], MD;  Location: [place] CV LAB;  Service: Cardiovascular;  Laterality: Bilateral;   Pars plana vitrectomy with 25-gauge system     PERIPHERAL VASCULAR ATHERECTOMY Left 11/15/2018   Procedure:  PERIPHERAL VASCULAR ATHERECTOMY;  Surgeon: [name], MD;  Location: [place] CV LAB;  Service: Cardiovascular;  Laterality: Left;  SFA with STENT   PERIPHERAL VASCULAR BALLOON ANGIOPLASTY Left 11/15/2018   Procedure: PERIPHERAL VASCULAR BALLOON ANGIOPLASTY;  Surgeon: [name], MD;  Location: [place] CV LAB;  Service: Cardiovascular;  Laterality: Left;  PT    Allergies as of 10/27/2019      Reactions   Occlusive Silicone Sheets [silicone] Other (See Comments)   "Allergic," per [facility]   Other Other  (See Comments)   Unknown reaction to Occlusive adhesive- "Allergic," per [facility]   Tape Itching, Other (See Comments)   Use Cloth tape only, please      Medication List       Accurate as of October 27, 2019  4:17 PM. If you have any questions, ask your nurse or doctor.        acetaminophen 500 MG tablet Commonly known as: TYLENOL Take 1,000 mg by mouth 2 (two) times daily.   ADULT NUTRITIONAL SUPPLEMENT PO Take 1 each by mouth daily. Magic Cup with dinner   NEPRO PO Take 1 Can by mouth daily.   atorvastatin 20 MG tablet Commonly known as: LIPITOR Take 20 mg by mouth daily.   calcium acetate 667 MG capsule Commonly known as: PHOSLO Take 667 mg by mouth 3 (three) times daily with meals.   dicyclomine 20 MG tablet Commonly known as: BENTYL Take 20 mg by mouth 3 (three) times daily before meals.   feeding supplement (PRO-STAT SUGAR FREE 64) Liqd Take 30 mLs by mouth 2 (two) times daily. 9a and 5p   Hectorol 4 MCG/2ML injection Generic drug: doxercalciferol Inject 2 mcg into the vein every Monday, Wednesday, and Friday with hemodialysis.   HYDROcodone-acetaminophen 5-325 MG tablet Commonly known as: Norco Take 1 tablet by mouth every 12 (twelve) hours.   loperamide 2 MG tablet Commonly known as: IMODIUM A-D Take 4 mg by mouth See admin instructions. Take 2 tablets ( 4 mg) by mouth after 1st loose stool as needed for diarrhea   metoprolol succinate 25 MG 24 hr tablet Commonly known as: TOPROL-XL Take 25 mg by mouth every Monday, Wednesday, and Friday. At bedtime   metoprolol tartrate 25 MG tablet Commonly known as: LOPRESSOR Take 25 mg by mouth two times a day on Sun/Tues/Thurs/Sat   midodrine 10 MG tablet Commonly known as: PROAMATINE Take one tablet (10 mg) by mouth on Monday, Wednesday, Friday 30 minutes prior to dialysis.   pantoprazole 40 MG tablet Commonly known as: PROTONIX Take 1 tablet (40 mg total) by mouth daily.   RENAL VITAMIN PO Take 1 tablet by  mouth at bedtime.   Vitamin D3 1.25 MG (50000 UT) Caps Take 1 capsule by mouth once a week.   warfarin 2.5 MG tablet Commonly known as: COUMADIN Take 2.5 mg by mouth See admin instructions. Take on Sun-Tue-Thu-Fri-Sat   warfarin 5 MG tablet Commonly known as: COUMADIN Take 5 mg by mouth daily. Take on Mondays and Wednesdays       No orders of the defined types were placed in this encounter.   Immunization History  Administered Date(s) Administered   Influenza-Unspecified 08/29/2019   Pneumococcal Polysaccharide-23 10/07/2019    Social History   Tobacco Use   Smoking status: Former Smoker    Types: Cigarettes    Quit date: 06/30/2004    Years since quitting: 15.3   Smokeless tobacco: Never Used  Substance Use Topics   Alcohol  use: No    Review of Systems  GENERAL:  no fevers, fatigue, appetite changes SKIN: No itching, rash HEENT: No complaint RESPIRATORY: No cough, wheezing, SOB CARDIAC: No chest pain, palpitations, lower extremity edema  GI: No abdominal pain, No N/V/D or constipation, No heartburn or reflux  GU: No dysuria, frequency or urgency, or incontinence  MUSCULOSKELETAL: No unrelieved bone/joint pain NEUROLOGIC: No headache, dizziness  PSYCHIATRIC: No overt anxiety or sadness; behaviors as per history present illness  Vitals:   10/27/19 1451  BP: (!) 109/52  Pulse: 77  Resp: 18  Temp: (!) 97.2 F (36.2 C)   Body mass index is 62.6 kg/m. Physical Exam  GENERAL APPEARANCE: Alert, minimally conversant, No acute distress  SKIN: No diaphoresis rash HEENT: Unremarkable RESPIRATORY: Breathing is even, unlabored. Lung sounds are clear   CARDIOVASCULAR: Heart RRR no murmurs, rubs or gallops. No peripheral edema  GASTROINTESTINAL: Abdomen is soft, non-tender, not distended w/ normal bowel sounds.  GENITOURINARY: Bladder non tender, not distended  MUSCULOSKELETAL: Bilateral AKA NEUROLOGIC: Cranial nerves 2-12 grossly intact. 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 ([place]) 03/24/2019   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 ([place]) 03/24/2019   Anemia due to end stage renal disease ([place]) 03/24/2019   Protein-calorie malnutrition, severe ([place]) 03/24/2019   Hyperlipidemia associated with type 2 diabetes mellitus ([place]) 02/22/2019   Amputation stump infection ([place]) 02/15/2019   Conjunctivitis of left eye 01/04/2019   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[place]) 12/30/2018   Open wound of left foot    Encounter for monitoring Coumadin therapy    OSA (obstructive sleep apnea)    HTN (hypertension)    ESRD on hemodialysis ([place])    BPH (benign prostatic hyperplasia)    Hypotension 12/07/2018   GERD (gastroesophageal reflux disease) 12/07/2018   Pressure injury of skin 12/03/2018   Symptomatic anemia 12/02/2018   Acute on chronic blood loss anemia 12/02/2018   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[place]) 12/02/2018   Lower GI bleeding 12/02/2018   Heme positive stool    Acute on chronic diastolic (congestive) heart failure ([place]) 11/19/2018   Acute CVA (cerebrovascular accident) ([place]) 11/19/2018   Hypoglycemia 11/19/2018   Peripheral vascular disease due to secondary diabetes ([place]) 11/19/2018   Dry gangrene ([place]) 11/19/2018   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   DNR (do not resuscitate) discussion    Goals of care, counseling/discussion     Palliative care by specialist    Acute hypoxemic respiratory failure ([place]) 11/06/2018   Volume overload 11/06/2018   Multifocal pneumonia [id number]   Acute metabolic encephalopathy [id number]   Physical deconditioning 11/06/2018   Acute respiratory failure with hypoxia and hypercapnia ([place]) 11/04/2018   CAP (community acquired pneumonia) 11/04/2018   Pleural effusion, right 11/04/2018   Increased ammonia level 11/04/2018   Abnormal CT of the abdomen 11/04/2018   Atrial fibrillation with RVR ([place]) 11/04/2018   Sepsis ([place]) 10/21/2018   Acute encephalopathy 10/21/2018   Elevated alkaline phosphatase level 10/21/2018  Chronic anemia 10/21/2018   Thrombocytopenia (H[facility]) 10/21/2018   Pulmonary HTN ([place]) 03/04/2017   Aortic valve stenosis 03/04/2017   Hypertension, accelerated with heart disease, without CHF 01/12/2017   Dyspnea on exertion 01/12/2017   ESRD (end stage renal disease) ([place]) 06/07/2012   Hyperlipidemia 04/24/2011   HYPERTENSION, BENIGN 10/24/2009   Type 2 diabetes mellitus with hypertension and end stage renal disease on dialysis ([place]) 10/23/2009   OBSTRUCTIVE SLEEP APNEA 10/23/2009   PAF (paroxysmal atrial fibrillation) ([place]) 10/23/2009   CHF (congestive heart failure) ([place]) 10/23/2009    CMP     Component Value Date/Time   NA 136 08/16/2019 1818   NA 143 06/06/2019   K 4.0 08/16/2019 1818   CL 97 (L) 08/16/2019 1818   CO2 25 08/16/2019 1818   GLUCOSE 75 08/16/2019 1818   BUN 28 (H) 08/16/2019 1818   BUN 44 (A) 06/06/2019   CREATININE 3.59 (H) 08/16/2019 1818   CALCIUM 9.5 08/16/2019 1818   CALCIUM 8.3 (L) 10/25/2008 1450   PROT 7.8 08/16/2019 1818   ALBUMIN 2.9 (L) 08/16/2019 1818   AST 54 (H) 08/16/2019 1818   ALT 38 08/16/2019 1818   ALKPHOS 229 (H) 08/16/2019 1818   BILITOT 1.2 08/16/2019 1818   GFRNONAA 15 (L) 08/16/2019 1818   GFRAA 18 (L) 08/16/2019 1818   Recent Labs    02/20/19 1300  05/28/19 0253 05/29/19 0541  05/29/19 2316 05/30/19 0727 05/31/19 1319 06/06/19 07/27/19 1529 08/16/19 1818  NA 137   < > 137  --   --  137 136 143 138 136  K 3.0*   < > 3.7  --   --  5.1 3.6 3.7 3.5 4.0  CL 101   < > 102  --   --  101 98  --  95* 97*  CO2 23   < > 21*  --   --  17* 24  --  26 25  GLUCOSE 101*   < > 151*  --   --  99 140*  --  143* 75  BUN 27*   < > 80*  --   --  158* 44* 44* 48* 28*  CREATININE 3.41*   < > 6.12*  --   --  8.32* 4.29* 3.5* 4.61* 3.59*  CALCIUM 7.7*   < > 9.3  --   --  10.0 9.1  --  10.2 9.5  MG  --    < > 2.0 2.1 2.4  --   --   --   --   --   PHOS 2.4*  --   --   --   --  2.6 3.0  --   --   --    < > = values in this interval not displayed.   Recent Labs    05/27/19 0348 05/28/19 0253 05/30/19 0727 05/31/19 1319 08/16/19 1818  AST 55* 31  --   --  54*  ALT 55* 42  --   --  38  ALKPHOS 272* 261*  --   --  229*  BILITOT 0.8 0.6  --   --  1.2  PROT 7.0 6.5  --   --  7.8  ALBUMIN 2.4* 2.2* 2.3* 2.3* 2.9*   Recent Labs    05/31/19 1319 06/06/19 07/27/19 1529 08/16/19 1818  WBC 10.3 11.5 18.6* 6.9  NEUTROABS  --  8 15.5* 4.4  HGB 7.6* 9.3* 12.4* 11.0*  HCT 24.7* 29* 40.2 34.8*  MCV 88.5  --  89.3 89.5  PLT 220 251 171 193   Recent Labs    11/11/18 1517  CHOL 113  LDLCALC 62  TRIG 60   No results found for: [facility] Lab Results  Component Value Date   TSH 1.839 05/25/2019   Lab Results  Component Value Date   HGBA1C 4.7 09/08/2019   Lab Results  Component Value Date   CHOL 113 11/11/2018   HDL 39 (L) 11/11/2018   LDLCALC 62 11/11/2018   TRIG 60 11/11/2018   CHOLHDL 2.9 11/11/2018    Significant Diagnostic Results in last 30 days:  No results found.  Assessment and Plan  Dementia with behaviors-we will start patient on Depakote 125 mg every morning and 200 mg nightly and monitor results  Paroxysmal atrial fib/chronic Coumadin use-INR therapeutic; continue current regimen and recheck in 1 week    Edward Duos, MD

## 2019-10-27 NOTE — Progress Notes (Signed)
Location:   [place] Room Number: 414/D Place of Service:  SNF [phone]) Provider:  Henreitta Leber, MD  Patient Care Team: Hennie Duos, MD as PCP - General (Internal Medicine) [name], MD as Consulting Physician (Nephrology) Rehab, [facility], [facility]  Extended Emergency Contact Information Primary Emergency Contact: [name],[name] Address: [place],  [address] [name] of [place] Phone: [phone] Work Phone: [phone] Mobile Phone: [phone] Relation: Daughter Secondary Emergency Contact: [name] Mobile Phone: [phone] Relation: Other  Code Status:  DNR Goals of care: Advanced Directive information Advanced Directives 10/27/2019  Does Patient Have a Medical Advance Directive? Yes  Type of Advance Directive Out of facility DNR (pink MOST or yellow form)  Does patient want to make changes to medical advance directive? -  Copy of [place] in Chart? -  Would patient like information on creating a medical advance directive? -  Pre-existing out of facility DNR order (yellow form or pink MOST form) Yellow form placed in chart (order not valid for inpatient use);Pink MOST form placed in chart (order not valid for inpatient use)     Chief Complaint  Patient presents with  . Anticoagulation    INR  Follow-up of anticoagulation with history of atrial fibrillation on chronic Coumadin  HPI:  Pt is a 78 y.o. male seen today for an acute visit for anticoagulation management with history of A. fib.  INR today is minimally subtherapeutic at 1.9-he is on 5 mg Monday and Wednesdays and 2.5 mg all other days.  It appears he has been quite stable on current dose.  Currently he is resting comfortably in bed and has no acute complaints he does continue on Lopressor for rate control.   There is been no  increased bruising or bleeding.  Of note he is on dialysis with a history of end-stage renal disease     Past Medical History:  Diagnosis Date  . A-fib ([place])   . Anemia   . Blood transfusion   . BPH (benign prostatic hyperplasia)   . CHF (congestive heart failure) ([place])   . Diarrhea   . DM (diabetes mellitus) ([place])   . ESRD on hemodialysis ([place])    Started dialysis in 2009  . History of GI bleed    secondary to coumadin  . HTN (hypertension)   . Hyperlipidemia   . OSA (obstructive sleep apnea)    uses CPAP  . Secondary hyperparathyroidism of renal origin [facility])    Past Surgical History:  Procedure Laterality Date  . ABDOMINAL AORTOGRAM N/A 11/15/2018   Procedure: ABDOMINAL AORTOGRAM;  Surgeon: [name], MD;  Location: [place] CV LAB;  Service: Cardiovascular;  Laterality: N/A;  . AMPUTATION Left 12/06/2018   Procedure: AMPUTATION DIGIT LEFT FIFTH TOE;  Surgeon: Angelia Mould, MD;  Location: [place];  Service: Vascular;  Laterality: Left;  . AMPUTATION Bilateral 12/29/2018   Procedure: AMPUTATION ABOVE KNEE;  Surgeon: Waynetta Sandy, MD;  Location: [place];  Service: Vascular;  Laterality: Bilateral;  . APPLICATION OF WOUND VAC Right 02/17/2019   Procedure: Application Of Wound Vac;  Surgeon: Marty Heck, MD;  Location: [place];  Service: Vascular;  Laterality: Right;  . BVT  123456   Left  Basilic Vein Transposition  . CHOLECYSTECTOMY    . EYE SURGERY  Catarct bil  . I&D EXTREMITY Right 02/17/2019   Procedure: Right above the kneee debridement;  Surgeon: Marty Heck, MD;  Location: [place];  Service: Vascular;  Laterality: Right;  . INSERTION OF DIALYSIS CATHETER  05/28/2012   Procedure: INSERTION OF DIALYSIS CATHETER;  Surgeon: Mal Misty, MD;  Location: [place];  Service: Vascular;  Laterality: Right;  . Left arm shuntogram.    . Left forearm loop graft with 6 mm Gore-Tex graft.    . LOWER EXTREMITY ANGIOGRAPHY Bilateral  11/15/2018   Procedure: LOWER EXTREMITY ANGIOGRAPHY;  Surgeon: [name], MD;  Location: [place] CV LAB;  Service: Cardiovascular;  Laterality: Bilateral;  . Pars plana vitrectomy with 25-gauge system    . PERIPHERAL VASCULAR ATHERECTOMY Left 11/15/2018   Procedure: PERIPHERAL VASCULAR ATHERECTOMY;  Surgeon: [name], MD;  Location: [place] CV LAB;  Service: Cardiovascular;  Laterality: Left;  SFA with STENT  . PERIPHERAL VASCULAR BALLOON ANGIOPLASTY Left 11/15/2018   Procedure: PERIPHERAL VASCULAR BALLOON ANGIOPLASTY;  Surgeon: [name], MD;  Location: [place] CV LAB;  Service: Cardiovascular;  Laterality: Left;  PT    Allergies  Allergen Reactions  . Occlusive Silicone Sheets [Silicone] Other (See Comments)    "Allergic," per MAR  . Other Other (See Comments)    Unknown reaction to Occlusive adhesive- "Allergic," per MAR  . Tape Itching and Other (See Comments)    Use Cloth tape only, please    Outpatient Encounter Medications as of 10/26/2019  Medication Sig  . acetaminophen (TYLENOL) 500 MG tablet Take 1,000 mg by mouth 2 (two) times daily.   . Amino Acids-Protein Hydrolys (FEEDING SUPPLEMENT, PRO-STAT SUGAR FREE 64,) LIQD Take 30 mLs by mouth 2 (two) times daily. 9a and 5p  . atorvastatin (LIPITOR) 20 MG tablet Take 20 mg by mouth daily.  . B Complex-C-Folic Acid (RENAL VITAMIN PO) Take 1 tablet by mouth at bedtime.   . calcium acetate (PHOSLO) 667 MG capsule Take 667 mg by mouth 3 (three) times daily with meals.   . Cholecalciferol (VITAMIN D3) 1.25 MG (50000 UT) CAPS Take 1 capsule by mouth once a week.  . dicyclomine (BENTYL) 20 MG tablet Take 20 mg by mouth 3 (three) times daily before meals.  Marland Kitchen doxercalciferol (HECTOROL) 4 MCG/2ML injection Inject 2 mcg into the vein every Monday, Wednesday, and Friday with hemodialysis.  Marland Kitchen HYDROcodone-acetaminophen (NORCO) 5-325 MG tablet Take 1 tablet by mouth every 12 (twelve) hours.  Marland Kitchen loperamide (IMODIUM  A-D) 2 MG tablet Take 4 mg by mouth See admin instructions. Take 2 tablets ( 4 mg) by mouth after 1st loose stool as needed for diarrhea  . metoprolol succinate (TOPROL-XL) 25 MG 24 hr tablet Take 25 mg by mouth every Monday, Wednesday, and Friday. At bedtime  . metoprolol tartrate (LOPRESSOR) 25 MG tablet Take 25 mg by mouth two times a day on Sun/Tues/Thurs/Sat  . midodrine (PROAMATINE) 10 MG tablet Take one tablet (10 mg) by mouth on Monday, Wednesday, Friday 30 minutes prior to dialysis.  . Nutritional Supplements (ADULT NUTRITIONAL SUPPLEMENT PO) Take 1 each by mouth daily. Magic Cup with dinner  . Nutritional Supplements (NEPRO PO) Take 1 Can by mouth daily.  . pantoprazole (PROTONIX) 40 MG tablet Take 1 tablet (40 mg total) by mouth daily.  Marland Kitchen warfarin (COUMADIN) 2.5 MG tablet Take 2.5 mg by mouth See admin instructions. Take on Sun-Tue-Thu-Fri-Sat  . warfarin (COUMADIN) 5 MG tablet Take 5 mg by mouth daily. Take on  Mondays and Wednesdays   No facility-administered encounter medications on file as of 10/26/2019.     Review of Systems\\ \This is limited secondary to patient being a poor historian-provided by nursing as well.  General no complaints of fever or chills.  Skin does not complain of rashes itching or increased bruising or bleeding.  Head ears eyes nose mouth and throat does not complain of visual changes or sore throat.  Respiratory does not complain of shortness of breath or cough.  Cardiac does not complain of chest pain or edema.  GI is not complaining of abdominal discomfort nausea vomiting diarrhea constipation.  GU is not complaining of dysuria does receive dialysis.  Musculoskeletal does have bilateral above-the-knee amputations he is not complaining of joint pain.  Neurologic does not complain of headache or dizziness.  And psych does not really complain of being depressed or anxious he is bright and alert talkative today  Immunization History   Administered Date(s) Administered  . Influenza-Unspecified 08/29/2019  . Pneumococcal Polysaccharide-23 10/07/2019   Pertinent  Health Maintenance Due  Topic Date Due  . FOOT EXAM  12/29/2019 (Originally 11/01/1951)  . OPHTHALMOLOGY EXAM  12/29/2019 (Originally 11/01/1951)  . HEMOGLOBIN A1C  03/07/2020  . PNA vac Low Risk Adult (2 of 2 - PCV13) 10/06/2020  . INFLUENZA VACCINE  Completed  . URINE MICROALBUMIN  Discontinued   No flowsheet data found. Functional Status Survey:   Temperature is 98.3 pulse 76 respiration 17 blood pressure 109/52 oxygen saturation is in the 90s on room air  Body mass index is 62.33 kg/m. Physical Exam   In general this is a pleasant elderly male in no distress lying comfortably in bed.  His skin is warm and dry I do not see any increased bruising or bleeding he does have covering over an area on his buttocks this is followed by wound care.  Eyes visual acuity appears to be intact sclera and conjunctive are somewhat icteric.  Oropharynx is clear mucous membranes moist.  Chest is clear to auscultation there is no labored breathing.  Heart is regular irregular rate and rhythm with his baseline systolic murmur I do not see evidence of increased edema.  Abdomen is soft nontender with positive bowel sounds.  Musculoskeletal is status post above-the-knee amputations bilaterally.  Moves his upper extremities at baseline is able to turn in bed.  Neurologic as noted above cannot really appreciate lateralizing findings his speech is clear he is speaking a bit more than usual today.  Psych he is pleasant and appropriate somewhat confused at baseline   Labs reviewed: October 26, 2019.  INR 1.9  Recent Labs    02/20/19 1300  05/28/19 0253 05/29/19 0541 05/29/19 2316 05/30/19 0727 05/31/19 1319 06/06/19 07/27/19 1529 08/16/19 1818  NA 137   < > 137  --   --  137 136 143 138 136  K 3.0*   < > 3.7  --   --  5.1 3.6 3.7 3.5 4.0  CL 101   < > 102   --   --  101 98  --  95* 97*  CO2 23   < > 21*  --   --  17* 24  --  26 25  GLUCOSE 101*   < > 151*  --   --  99 140*  --  143* 75  BUN 27*   < > 80*  --   --  158* 44* 44* 48* 28*  CREATININE 3.41*   < >  6.12*  --   --  8.32* 4.29* 3.5* 4.61* 3.59*  CALCIUM 7.7*   < > 9.3  --   --  10.0 9.1  --  10.2 9.5  MG  --    < > 2.0 2.1 2.4  --   --   --   --   --   PHOS 2.4*  --   --   --   --  2.6 3.0  --   --   --    < > = values in this interval not displayed.   Recent Labs    05/27/19 0348 05/28/19 0253 05/30/19 0727 05/31/19 1319 08/16/19 1818  AST 55* 31  --   --  54*  ALT 55* 42  --   --  38  ALKPHOS 272* 261*  --   --  229*  BILITOT 0.8 0.6  --   --  1.2  PROT 7.0 6.5  --   --  7.8  ALBUMIN 2.4* 2.2* 2.3* 2.3* 2.9*   Recent Labs    05/31/19 1319 06/06/19 07/27/19 1529 08/16/19 1818  WBC 10.3 11.5 18.6* 6.9  NEUTROABS  --  8 15.5* 4.4  HGB 7.6* 9.3* 12.4* 11.0*  HCT 24.7* 29* 40.2 34.8*  MCV 88.5  --  89.3 89.5  PLT 220 251 171 193   Lab Results  Component Value Date   TSH 1.839 05/25/2019   Lab Results  Component Value Date   HGBA1C 4.7 09/08/2019   Lab Results  Component Value Date   CHOL 113 11/11/2018   HDL 39 (L) 11/11/2018   LDLCALC 62 11/11/2018   TRIG 60 11/11/2018   CHOLHDL 2.9 11/11/2018    Significant Diagnostic Results in last 30 days:  No results found.  Assessment/Plan  #1 history of atrial fibrillation on chronic Coumadin-INR is 1.9 this is minimally depressed-he has been fairly stable on current dose will give him a slightly increased dose this evening of 3 mg instead of 2.5 mg-otherwise continue his dose of 5 mg on Mondays and Wednesdays 2.5 mg all other days-will update an INR on Monday, November 2.  In regards to atrial fibrillation this appears rate controlled on Lopressor 25 mg twice daily which he receives Sundays Tuesdays Thursdays and Saturdays ---receives Lopressor at at bedtime on Monday Wednesday and Fridays which are his dialysis  days   [id number]

## 2019-10-29 ENCOUNTER — Encounter: Payer: Self-pay | Admitting: Internal Medicine

## 2019-11-03 ENCOUNTER — Non-Acute Institutional Stay (SKILLED_NURSING_FACILITY): Payer: Medicare Other | Admitting: Internal Medicine

## 2019-11-03 ENCOUNTER — Encounter: Payer: Self-pay | Admitting: Internal Medicine

## 2019-11-03 DIAGNOSIS — Z7901 Long term (current) use of anticoagulants: Secondary | ICD-10-CM

## 2019-11-03 DIAGNOSIS — I480 Paroxysmal atrial fibrillation: Secondary | ICD-10-CM | POA: Diagnosis not present

## 2019-11-03 NOTE — Progress Notes (Signed)
Location:  [place] Room Number: 414-D Place of Service:  SNF (31)  Edward Duos, MD  Patient Care Team: Edward Duos, MD as PCP - General (Internal Medicine) [name], MD as Consulting Physician (Nephrology) Rehab, [place] [facility], [facility]  Extended Emergency Contact Information Primary Emergency Contact: [name],[name] Address: [place], [place] [address] [name] of [place] Phone: [phone] Work Phone: [phone] Mobile Phone: [phone] Relation: Daughter Secondary Emergency Contact: [name] Mobile Phone: [phone] Relation: Other    Allergies: Occlusive silicone sheets [silicone], Other, and Tape  Chief Complaint  Patient presents with  . Acute Visit    Anticoagulation management    HPI: Patient is a 78 y.o. male who is being seen for INR check.  Patient's INR is 2.3 on 5 mg Monday Wednesday and 2.5 mg of Coumadin every other day.  This is a therapeutic range for this patient with atrial fibrillation.  Past Medical History:  Diagnosis Date  . A-fib ([place])   . Anemia   . Blood transfusion   . BPH (benign prostatic hyperplasia)   . CHF (congestive heart failure) ([place])   . Diarrhea   . DM (diabetes mellitus) ([place])   . ESRD on hemodialysis ([place])    Started dialysis in 2009  . History of GI bleed    secondary to coumadin  . HTN (hypertension)   . Hyperlipidemia   . OSA (obstructive sleep apnea)    uses CPAP  . Secondary hyperparathyroidism of renal origin [facility])     Past Surgical History:  Procedure Laterality Date  . ABDOMINAL AORTOGRAM N/A 11/15/2018   Procedure: ABDOMINAL AORTOGRAM;  Surgeon: [name], MD;  Location: [place] CV LAB;  Service: Cardiovascular;  Laterality: N/A;  . AMPUTATION Left 12/06/2018   Procedure: AMPUTATION DIGIT LEFT FIFTH TOE;  Surgeon: Angelia Mould, MD;  Location: [place];  Service: Vascular;  Laterality: Left;  . AMPUTATION Bilateral 12/29/2018   Procedure: AMPUTATION ABOVE KNEE;  Surgeon: Waynetta Sandy, MD;  Location: [place];  Service: Vascular;  Laterality: Bilateral;  . APPLICATION OF WOUND VAC Right 02/17/2019   Procedure: Application Of Wound Vac;  Surgeon: Marty Heck, MD;  Location: [place];  Service: Vascular;  Laterality: Right;  . BVT  123456   Left  Basilic Vein Transposition  . CHOLECYSTECTOMY    . EYE SURGERY     Catarct bil  . I&D EXTREMITY Right 02/17/2019   Procedure: Right above the kneee debridement;  Surgeon: Marty Heck, MD;  Location: [place];  Service: Vascular;  Laterality: Right;  . INSERTION OF DIALYSIS CATHETER  05/28/2012   Procedure: INSERTION OF DIALYSIS CATHETER;  Surgeon: Mal Misty, MD;  Location: [place];  Service: Vascular;  Laterality: Right;  . Left arm shuntogram.    . Left forearm loop graft with 6 mm Gore-Tex graft.    . LOWER EXTREMITY ANGIOGRAPHY Bilateral 11/15/2018   Procedure: LOWER EXTREMITY ANGIOGRAPHY;  Surgeon: [name], MD;  Location: [place] CV LAB;  Service: Cardiovascular;  Laterality: Bilateral;  . Pars plana vitrectomy with 25-gauge system    . PERIPHERAL VASCULAR ATHERECTOMY Left 11/15/2018   Procedure: PERIPHERAL VASCULAR ATHERECTOMY;  Surgeon: [name], MD;  Location: [place] CV LAB;  Service: Cardiovascular;  Laterality: Left;  SFA with STENT  . PERIPHERAL VASCULAR BALLOON ANGIOPLASTY Left 11/15/2018  Procedure: PERIPHERAL VASCULAR BALLOON ANGIOPLASTY;  Surgeon: [name], MD;  Location: [place] CV LAB;  Service: Cardiovascular;  Laterality: Left;  PT    Allergies as of 11/03/2019      Reactions   Occlusive Silicone Sheets [silicone] Other (See Comments)   "Allergic," per [facility]   Other Other (See Comments)   Unknown reaction to Occlusive adhesive- "Allergic," per MAR   Tape Itching, Other (See Comments)   Use Cloth tape only, please       Medication List       Accurate as of November 03, 2019  4:01 PM. If you have any questions, ask your nurse or doctor.        acetaminophen 500 MG tablet Commonly known as: TYLENOL Take 1,000 mg by mouth 2 (two) times daily.   ADULT NUTRITIONAL SUPPLEMENT PO Take 1 each by mouth daily. Magic Cup with dinner   NEPRO PO Take 1 Can by mouth daily.   atorvastatin 20 MG tablet Commonly known as: LIPITOR Take 20 mg by mouth daily.   calcium acetate 667 MG capsule Commonly known as: PHOSLO Take 667 mg by mouth 3 (three) times daily with meals.   dicyclomine 20 MG tablet Commonly known as: BENTYL Take 20 mg by mouth 3 (three) times daily before meals.   divalproex 125 MG capsule Commonly known as: DEPAKOTE SPRINKLE Take 125 mg by mouth every morning.   divalproex 125 MG capsule Commonly known as: DEPAKOTE SPRINKLE Take 250 mg by mouth at bedtime.   feeding supplement (PRO-STAT SUGAR FREE 64) Liqd Take 30 mLs by mouth 2 (two) times daily. 9a and 5p   Hectorol 4 MCG/2ML injection Generic drug: doxercalciferol Inject 2 mcg into the vein every Monday, Wednesday, and Friday with hemodialysis.   HYDROcodone-acetaminophen 5-325 MG tablet Commonly known as: Norco Take 1 tablet by mouth every 12 (twelve) hours.   loperamide 2 MG tablet Commonly known as: IMODIUM A-D Take 4 mg by mouth See admin instructions. Take 2 tablets ( 4 mg) by mouth after 1st loose stool as needed for diarrhea   metoprolol succinate 25 MG 24 hr tablet Commonly known as: TOPROL-XL Take 25 mg by mouth every Monday, Wednesday, and Friday. At bedtime   metoprolol tartrate 25 MG tablet Commonly known as: LOPRESSOR Take 25 mg by mouth See admin instructions. Take BID on Sun/Tues/Thurs/Sat   midodrine 10 MG tablet Commonly known as: PROAMATINE Take one tablet (10 mg) by mouth on Monday, Wednesday, Friday 30 minutes prior to dialysis.   pantoprazole 40 MG tablet Commonly known as: PROTONIX Take 1  tablet (40 mg total) by mouth daily.   RENAL VITAMIN PO Take 1 tablet by mouth at bedtime.   sevelamer carbonate 800 MG tablet Commonly known as: RENVELA Take 1,600 mg by mouth 3 (three) times daily with meals.   Vitamin D3 1.25 MG (50000 UT) Caps Take 1 capsule by mouth once a week.   warfarin 2.5 MG tablet Commonly known as: COUMADIN Take 2.5 mg by mouth See admin instructions. Take on Sun-Tue-Thu-Fri-Sat   warfarin 5 MG tablet Commonly known as: COUMADIN Take 5 mg by mouth daily. Take on Mondays and Wednesdays       No orders of the defined types were placed in this encounter.   Immunization History  Administered Date(s) Administered  . Influenza-Unspecified 08/29/2019  . Pneumococcal Polysaccharide-23 10/07/2019    Social History   Tobacco Use  . Smoking status: Former Smoker    Types: Cigarettes  Quit date: 06/30/2004    Years since quitting: 15.3  . Smokeless tobacco: Never Used  Substance Use Topics  . Alcohol use: No     Vitals:   11/03/19 1548  BP: 108/69  Pulse: 79  Resp: 17  Temp: 98.2 F (36.8 C)   Body mass index is 63.42 kg/m.   Patient Active Problem List   Diagnosis Date Noted  . Infection due to ESBL-producing Escherichia coli 08/12/2019  . MRSA cellulitis 08/12/2019  . Sacral decubitus ulcer, stage IV ([place]) 08/05/2019  . Infected decubitus ulcer, stage IV ([place]) 08/05/2019  . 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 End stage renal disease on dialysis due to type 2 diabetes mellitus ([place]) 03/24/2019  . 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 Anemia due to end stage renal disease ([place]) 03/24/2019  . Protein-calorie malnutrition, severe ([place]) 03/24/2019  . Hyperlipidemia associated with type 2 diabetes mellitus ([place]) 02/22/2019  . Amputation stump infection ([place]) 02/15/2019  . Conjunctivitis  of left eye 01/04/2019  . 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[place]) 12/30/2018  . Open wound of left foot   . Encounter for monitoring Coumadin therapy   . OSA (obstructive sleep apnea)   . HTN (hypertension)   . ESRD on hemodialysis ([place])   . BPH (benign prostatic hyperplasia)   . Hypotension 12/07/2018  . GERD (gastroesophageal reflux disease) 12/07/2018  . Pressure injury of skin 12/03/2018  . Symptomatic anemia 12/02/2018  . Acute on chronic blood loss anemia 12/02/2018  . 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 Lower GI bleeding 12/02/2018  . Heme positive stool   . Acute on chronic diastolic (congestive) heart failure ([place]) 11/19/2018  . Acute CVA (cerebrovascular accident) ([place]) 11/19/2018  . Hypoglycemia 11/19/2018  . Peripheral vascular disease due to secondary diabetes ([place]) 11/19/2018  . Dry gangrene ([place]) 11/19/2018  . 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 DNR (do not resuscitate) discussion   . Goals of care, counseling/discussion   . Palliative care by specialist   . Acute hypoxemic respiratory failure ([place]) 11/06/2018  . Volume overload 11/06/2018  . Multifocal pneumonia 11/06/2018  . Acute metabolic encephalopathy [id number]  . Physical deconditioning 11/06/2018  . Acute respiratory failure with hypoxia and hypercapnia ([place]) 11/04/2018  . CAP (community acquired pneumonia) 11/04/2018  . Pleural effusion, right 11/04/2018  . Increased ammonia level 11/04/2018  . Abnormal CT of the abdomen 11/04/2018  . Atrial fibrillation with RVR ([place]) 11/04/2018  . Sepsis ([place]) 10/21/2018  . Acute encephalopathy 10/21/2018  . Elevated alkaline phosphatase level 10/21/2018  . Chronic anemia 10/21/2018  . Thrombocytopenia ([place]) 10/21/2018  . Pulmonary HTN ([place]) 03/04/2017  . Aortic valve stenosis 03/04/2017  . Hypertension, accelerated with heart disease, without CHF 01/12/2017  . Dyspnea on exertion 01/12/2017  . ESRD (end stage  renal disease) ([place]) 06/07/2012  . Hyperlipidemia 04/24/2011  . HYPERTENSION, BENIGN 10/24/2009  . Type 2 diabetes mellitus with hypertension and end stage renal disease on dialysis ([place]) 10/23/2009  . OBSTRUCTIVE SLEEP APNEA 10/23/2009  . PAF (paroxysmal atrial fibrillation) ([place]) 10/23/2009  . CHF (congestive heart failure) ([place]) 10/23/2009    CMP     Component Value Date/Time   NA 136 08/16/2019 1818   NA 143 06/06/2019   K 4.0 08/16/2019 1818   CL 97 (L) 08/16/2019 1818   CO2 25 08/16/2019 1818  GLUCOSE 75 08/16/2019 1818   BUN 28 (H) 08/16/2019 1818   BUN 44 (A) 06/06/2019   CREATININE 3.59 (H) 08/16/2019 1818   CALCIUM 9.5 08/16/2019 1818   CALCIUM 8.3 (L) 10/25/2008 1450   PROT 7.8 08/16/2019 1818   ALBUMIN 2.9 (L) 08/16/2019 1818   AST 54 (H) 08/16/2019 1818   ALT 38 08/16/2019 1818   ALKPHOS 229 (H) 08/16/2019 1818   BILITOT 1.2 08/16/2019 1818   GFRNONAA 15 (L) 08/16/2019 1818   GFRAA 18 (L) 08/16/2019 1818   Recent Labs    02/20/19 1300  05/28/19 0253 05/29/19 0541 05/29/19 2316 05/30/19 0727 05/31/19 1319 06/06/19 07/27/19 1529 08/16/19 1818  NA 137   < > 137  --   --  137 136 143 138 136  K 3.0*   < > 3.7  --   --  5.1 3.6 3.7 3.5 4.0  CL 101   < > 102  --   --  101 98  --  95* 97*  CO2 23   < > 21*  --   --  17* 24  --  26 25  GLUCOSE 101*   < > 151*  --   --  99 140*  --  143* 75  BUN 27*   < > 80*  --   --  158* 44* 44* 48* 28*  CREATININE 3.41*   < > 6.12*  --   --  8.32* 4.29* 3.5* 4.61* 3.59*  CALCIUM 7.7*   < > 9.3  --   --  10.0 9.1  --  10.2 9.5  MG  --    < > 2.0 2.1 2.4  --   --   --   --   --   PHOS 2.4*  --   --   --   --  2.6 3.0  --   --   --    < > = values in this interval not displayed.   Recent Labs    05/27/19 0348 05/28/19 0253 05/30/19 0727 05/31/19 1319 08/16/19 1818  AST 55* 31  --   --  54*  ALT 55* 42  --   --  38  ALKPHOS 272* 261*  --   --  229*  BILITOT 0.8 0.6  --   --  1.2  PROT 7.0 6.5  --   --  7.8   ALBUMIN 2.4* 2.2* 2.3* 2.3* 2.9*   Recent Labs    05/31/19 1319 06/06/19 07/27/19 1529 08/16/19 1818  WBC 10.3 11.5 18.6* 6.9  NEUTROABS  --  8 15.5* 4.4  HGB 7.6* 9.3* 12.4* 11.0*  HCT 24.7* 29* 40.2 34.8*  MCV 88.5  --  89.3 89.5  PLT 220 251 171 193   Recent Labs    11/11/18 1517  CHOL 113  LDLCALC 62  TRIG 60   No results found for: [facility] Lab Results  Component Value Date   TSH 1.839 05/25/2019   Lab Results  Component Value Date   HGBA1C 4.7 09/08/2019   Lab Results  Component Value Date   CHOL 113 11/11/2018   HDL 39 (L) 11/11/2018   LDLCALC 62 11/11/2018   TRIG 60 11/11/2018   CHOLHDL 2.9 11/11/2018    Significant Diagnostic Results in last 30 days:  No results found.  Assessment and Plan  Atrial fibrillation/chronic Coumadin-patient's INR is 2.3 which is therapeutic; will continue Coumadin 5 mg on Monday Wednesday and 2.5 mg every other day and repeat  INR in 1 week    [name] , MD

## 2019-11-05 ENCOUNTER — Encounter: Payer: Self-pay | Admitting: Internal Medicine

## 2019-11-10 ENCOUNTER — Encounter: Payer: Self-pay | Admitting: Internal Medicine

## 2019-11-10 ENCOUNTER — Non-Acute Institutional Stay (SKILLED_NURSING_FACILITY): Payer: Medicare Other | Admitting: Internal Medicine

## 2019-11-10 DIAGNOSIS — Z7901 Long term (current) use of anticoagulants: Secondary | ICD-10-CM

## 2019-11-10 DIAGNOSIS — I480 Paroxysmal atrial fibrillation: Secondary | ICD-10-CM | POA: Diagnosis not present

## 2019-11-10 NOTE — Progress Notes (Signed)
This encounter was created in error - please disregard.

## 2019-11-10 NOTE — Progress Notes (Signed)
Location:  [facility] Room Number: 414-D Place of Service:  SNF (31)  Hennie Duos, MD  Patient Care Team: Hennie Duos, MD as PCP - General (Internal Medicine) [name], MD as Consulting Physician (Nephrology) Rehab, [place] ([place], [facility]  Extended Emergency Contact Information Primary Emergency Contact: [name],[name] Address: [place], [place] [address] [name] [place] Phone: [phone] Work Phone: [phone] Mobile Phone: [phone] Relation: Daughter Secondary Emergency Contact: [name] Mobile Phone: [phone] Relation: Other    Allergies: Occlusive silicone sheets [silicone], Other, and Tape  Chief Complaint  Patient presents with  . Acute Visit    Anticoagulation management    HPI: Patient is a 78 y.o. male who is being seen for management of Coumadin.  Today patient's INR is 2.1 with a history of atrial fib this is therapeutic.  Past Medical History:  Diagnosis Date  . A-fib ([place])   . Anemia   . Blood transfusion   . BPH (benign prostatic hyperplasia)   . CHF (congestive heart failure) ([place])   . Diarrhea   . DM (diabetes mellitus) ([place])   . ESRD on hemodialysis ([place])    Started dialysis in 2009  . History of GI bleed    secondary to coumadin  . HTN (hypertension)   . Hyperlipidemia   . OSA (obstructive sleep apnea)    uses CPAP  . Secondary hyperparathyroidism of renal origin [facility])     Past Surgical History:  Procedure Laterality Date  . ABDOMINAL AORTOGRAM N/A 11/15/2018   Procedure: ABDOMINAL AORTOGRAM;  Surgeon: [name], MD;  Location: [place] CV LAB;  Service: Cardiovascular;  Laterality: N/A;  . AMPUTATION Left 12/06/2018   Procedure: AMPUTATION DIGIT LEFT FIFTH TOE;  Surgeon: Angelia Mould, MD;  Location: [place];  Service: Vascular;  Laterality: Left;  . AMPUTATION Bilateral  12/29/2018   Procedure: AMPUTATION ABOVE KNEE;  Surgeon: Waynetta Sandy, MD;  Location: [place];  Service: Vascular;  Laterality: Bilateral;  . APPLICATION OF WOUND VAC Right 02/17/2019   Procedure: Application Of Wound Vac;  Surgeon: Marty Heck, MD;  Location: [place];  Service: Vascular;  Laterality: Right;  . BVT  [id number]   Left  Basilic Vein Transposition  . CHOLECYSTECTOMY    . EYE SURGERY     Catarct bil  . I&D EXTREMITY Right 02/17/2019   Procedure: Right above the kneee debridement;  Surgeon: Marty Heck, MD;  Location: [place];  Service: Vascular;  Laterality: Right;  . INSERTION OF DIALYSIS CATHETER  05/28/2012   Procedure: INSERTION OF DIALYSIS CATHETER;  Surgeon: Mal Misty, MD;  Location: [facility];  Service: Vascular;  Laterality: Right;  . Left arm shuntogram.    . Left forearm loop graft with 6 mm Gore-Tex graft.    . LOWER EXTREMITY ANGIOGRAPHY Bilateral 11/15/2018   Procedure: LOWER EXTREMITY ANGIOGRAPHY;  Surgeon: [name], MD;  Location: [place] CV LAB;  Service: Cardiovascular;  Laterality: Bilateral;  . Pars plana vitrectomy with 25-gauge system    . PERIPHERAL VASCULAR ATHERECTOMY Left 11/15/2018   Procedure: PERIPHERAL VASCULAR ATHERECTOMY;  Surgeon: [name], MD;  Location: [place] CV LAB;  Service: Cardiovascular;  Laterality: Left;  SFA with STENT  . PERIPHERAL VASCULAR BALLOON ANGIOPLASTY Left 11/15/2018   Procedure: PERIPHERAL VASCULAR BALLOON ANGIOPLASTY;  Surgeon: [name], MD;  Location:  [place] INVASIVE CV LAB;  Service: Cardiovascular;  Laterality: Left;  PT    Allergies as of 11/10/2019      Reactions   Occlusive Silicone Sheets [silicone] Other (See Comments)   "Allergic," per [facility]   Other Other (See Comments)   Unknown reaction to Occlusive adhesive- "Allergic," per [facility]   Tape Itching, Other (See Comments)   Use Cloth tape only, please      Medication List       Accurate as of November 10, 2019 11:59  PM. If you have any questions, ask your nurse or doctor.        STOP taking these medications   calcium acetate 667 MG capsule Commonly known as: PHOSLO Stopped by: Inocencio Homes, MD     TAKE these medications   acetaminophen 500 MG tablet Commonly known as: TYLENOL Take 1,000 mg by mouth 2 (two) times daily.   ADULT NUTRITIONAL SUPPLEMENT PO Take 1 each by mouth daily. Magic Cup with dinner   NEPRO PO Take 1 Can by mouth daily.   atorvastatin 20 MG tablet Commonly known as: LIPITOR Take 20 mg by mouth daily.   dicyclomine 20 MG tablet Commonly known as: BENTYL Take 20 mg by mouth 3 (three) times daily before meals.   divalproex 125 MG capsule Commonly known as: DEPAKOTE SPRINKLE Take 125 mg by mouth every morning.   divalproex 125 MG capsule Commonly known as: DEPAKOTE SPRINKLE Take 250 mg by mouth at bedtime.   feeding supplement (PRO-STAT SUGAR FREE 64) Liqd Take 30 mLs by mouth 2 (two) times daily. 9a and 5p   Hectorol 4 MCG/2ML injection Generic drug: doxercalciferol Inject 2 mcg into the vein every Monday, Wednesday, and Friday with hemodialysis.   HYDROcodone-acetaminophen 5-325 MG tablet Commonly known as: Norco Take 1 tablet by mouth every 12 (twelve) hours.   loperamide 2 MG tablet Commonly known as: IMODIUM A-D Take 4 mg by mouth See admin instructions. Take 2 tablets ( 4 mg) by mouth after 1st loose stool as needed for diarrhea   metoprolol succinate 25 MG 24 hr tablet Commonly known as: TOPROL-XL Take 25 mg by mouth every Monday, Wednesday, and Friday. At bedtime   metoprolol tartrate 25 MG tablet Commonly known as: LOPRESSOR Take 25 mg by mouth See admin instructions. Take BID on Sun/Tues/Thurs/Sat   midodrine 10 MG tablet Commonly known as: PROAMATINE Take one tablet (10 mg) by mouth on Monday, Wednesday, Friday 30 minutes prior to dialysis.   pantoprazole 40 MG tablet Commonly known as: PROTONIX Take 1 tablet (40 mg total) by mouth  daily.   RENAL VITAMIN PO Take 1 tablet by mouth at bedtime.   sevelamer carbonate 800 MG tablet Commonly known as: RENVELA Take 1,600 mg by mouth 3 (three) times daily with meals.   Vitamin D3 1.25 MG (50000 UT) Caps Take 1 capsule by mouth once a week.   warfarin 2.5 MG tablet Commonly known as: COUMADIN Take 2.5 mg by mouth See admin instructions. Take on Sun-Tue-Thu-Fri-Sat   warfarin 5 MG tablet Commonly known as: COUMADIN Take 5 mg by mouth daily. Take on Mondays and Wednesdays       No orders of the defined types were placed in this encounter.   Immunization History  Administered Date(s) Administered  . Influenza-Unspecified 08/29/2019  . Pneumococcal Polysaccharide-23 10/07/2019    Social History   Tobacco Use  . Smoking status: Former Smoker    Types: Cigarettes    Quit date: 06/30/2004  Years since quitting: 15.3  . Smokeless tobacco: Never Used  Substance Use Topics  . Alcohol use: No     Vitals:   11/10/19 1617  BP: 139/76  Pulse: 70  Resp: 18  Temp: 98.1 F (36.7 C)   Body mass index is 62.82 kg/m.   Patient Active Problem List   Diagnosis Date Noted  . Infection due to ESBL-producing Escherichia coli 08/12/2019  . MRSA cellulitis 08/12/2019  . Sacral decubitus ulcer, stage IV ([place]) 08/05/2019  . Infected decubitus ulcer, stage IV ([place]) 08/05/2019  . Hypertensive heart and kidney disease with chronic diastolic congestive heart failure and stage 5 chronic kidney disease on chronic dialysis ([place]) 03/24/2019  . End stage renal disease on dialysis due to type 2 diabetes mellitus ([place]) 03/24/2019  . 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 Anemia due to end stage renal disease ([place]) 03/24/2019  . Protein-calorie malnutrition, severe ([place]) 03/24/2019  . Hyperlipidemia associated with type 2 diabetes mellitus ([place]) 02/22/2019  . Amputation stump infection () 02/15/2019  . Conjunctivitis of left eye 01/04/2019  .  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[place]) 12/30/2018  . Open wound of left foot   . Encounter for monitoring Coumadin therapy   . OSA (obstructive sleep apnea)   . HTN (hypertension)   . ESRD on hemodialysis ([place])   . BPH (benign prostatic hyperplasia)   . Hypotension 12/07/2018  . GERD (gastroesophageal reflux disease) 12/07/2018  . Pressure injury of skin 12/03/2018  . Symptomatic anemia 12/02/2018  . Acute on chronic blood loss anemia 12/02/2018  . 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 Lower GI bleeding 12/02/2018  . Heme positive stool   . Acute on chronic diastolic (congestive) heart failure ([place]) 11/19/2018  . Acute CVA (cerebrovascular accident) ([place]) 11/19/2018  . Hypoglycemia 11/19/2018  . Peripheral vascular disease due to secondary diabetes ([place]) 11/19/2018  . Dry gangrene ([place]) 11/19/2018  . 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 DNR (do not resuscitate) discussion   . Goals of care, counseling/discussion   . Palliative care by specialist   . Acute hypoxemic respiratory failure ([place]) 11/06/2018  . Volume overload 11/06/2018  . Multifocal pneumonia 11/06/2018  . Acute metabolic encephalopathy [id number]  . Physical deconditioning 11/06/2018  . Acute respiratory failure with hypoxia and hypercapnia ([place]) 11/04/2018  . CAP (community acquired pneumonia) 11/04/2018  . Pleural effusion, right 11/04/2018  . Increased ammonia level 11/04/2018  . Abnormal CT of the abdomen 11/04/2018  . Atrial fibrillation with RVR ([place]) 11/04/2018  . Sepsis ([place]) 10/21/2018  . Acute encephalopathy 10/21/2018  . Elevated alkaline phosphatase level 10/21/2018  . Chronic anemia 10/21/2018  . Thrombocytopenia ([place]) 10/21/2018  . Pulmonary HTN ([place]) 03/04/2017  . Aortic valve stenosis 03/04/2017  . Hypertension, accelerated with heart disease, without CHF 01/12/2017  . Dyspnea on exertion 01/12/2017  . ESRD (end stage renal disease) ([place])  06/07/2012  . Hyperlipidemia 04/24/2011  . HYPERTENSION, BENIGN 10/24/2009  . Type 2 diabetes mellitus with hypertension and end stage renal disease on dialysis ([place]) 10/23/2009  . OBSTRUCTIVE SLEEP APNEA 10/23/2009  . PAF (paroxysmal atrial fibrillation) ([place]) 10/23/2009  . CHF (congestive heart failure) ([place]) 10/23/2009    CMP     Component Value Date/Time   NA 136 08/16/2019 1818   NA 143 06/06/2019   K 4.0 08/16/2019 1818   CL 97 (L) 08/16/2019 1818   CO2 25 08/16/2019 1818   GLUCOSE 75 08/16/2019 1818  BUN 28 (H) 08/16/2019 1818   BUN 44 (A) 06/06/2019   CREATININE 3.59 (H) 08/16/2019 1818   CALCIUM 9.5 08/16/2019 1818   CALCIUM 8.3 (L) 10/25/2008 1450   PROT 7.8 08/16/2019 1818   ALBUMIN 2.9 (L) 08/16/2019 1818   AST 54 (H) 08/16/2019 1818   ALT 38 08/16/2019 1818   ALKPHOS 229 (H) 08/16/2019 1818   BILITOT 1.2 08/16/2019 1818   GFRNONAA 15 (L) 08/16/2019 1818   GFRAA 18 (L) 08/16/2019 1818   Recent Labs    02/20/19 1300  05/28/19 0253 05/29/19 0541 05/29/19 2316 05/30/19 0727 05/31/19 1319 06/06/19 07/27/19 1529 08/16/19 1818  NA 137   < > 137  --   --  137 136 143 138 136  K 3.0*   < > 3.7  --   --  5.1 3.6 3.7 3.5 4.0  CL 101   < > 102  --   --  101 98  --  95* 97*  CO2 23   < > 21*  --   --  17* 24  --  26 25  GLUCOSE 101*   < > 151*  --   --  99 140*  --  143* 75  BUN 27*   < > 80*  --   --  158* 44* 44* 48* 28*  CREATININE 3.41*   < > 6.12*  --   --  8.32* 4.29* 3.5* 4.61* 3.59*  CALCIUM 7.7*   < > 9.3  --   --  10.0 9.1  --  10.2 9.5  MG  --    < > 2.0 2.1 2.4  --   --   --   --   --   PHOS 2.4*  --   --   --   --  2.6 3.0  --   --   --    < > = values in this interval not displayed.   Recent Labs    05/27/19 0348 05/28/19 0253 05/30/19 0727 05/31/19 1319 08/16/19 1818  AST 55* 31  --   --  54*  ALT 55* 42  --   --  38  ALKPHOS 272* 261*  --   --  229*  BILITOT 0.8 0.6  --   --  1.2  PROT 7.0 6.5  --   --  7.8  ALBUMIN 2.4* 2.2* 2.3*  2.3* 2.9*   Recent Labs    05/31/19 1319 06/06/19 07/27/19 1529 08/16/19 1818  WBC 10.3 11.5 18.6* 6.9  NEUTROABS  --  8 15.5* 4.4  HGB 7.6* 9.3* 12.4* 11.0*  HCT 24.7* 29* 40.2 34.8*  MCV 88.5  --  89.3 89.5  PLT 220 251 171 193   Recent Labs    11/11/18 1517  CHOL 113  LDLCALC 62  TRIG 60   No results found for: [facility] Lab Results  Component Value Date   TSH 1.839 05/25/2019   Lab Results  Component Value Date   HGBA1C 4.7 09/08/2019   Lab Results  Component Value Date   CHOL 113 11/11/2018   HDL 39 (L) 11/11/2018   LDLCALC 62 11/11/2018   TRIG 60 11/11/2018   CHOLHDL 2.9 11/11/2018    Significant Diagnostic Results in last 30 days:  No results found.  Assessment and Plan  Atrial fibrillation/chronic anticoagulation-patient's INR is 2.1 on 5 mg of Coumadin Monday Wednesday and 2.5 mg of Coumadin every other day.  This is therapeutic and will continue current dose and  recheck INR in 1 week    Hennie Duos , MD

## 2019-11-12 ENCOUNTER — Encounter: Payer: Self-pay | Admitting: Internal Medicine

## 2019-11-17 ENCOUNTER — Non-Acute Institutional Stay (SKILLED_NURSING_FACILITY): Payer: Medicare Other | Admitting: Internal Medicine

## 2019-11-17 ENCOUNTER — Encounter: Payer: Self-pay | Admitting: Internal Medicine

## 2019-11-17 DIAGNOSIS — I480 Paroxysmal atrial fibrillation: Secondary | ICD-10-CM | POA: Diagnosis not present

## 2019-11-17 DIAGNOSIS — Z7901 Long term (current) use of anticoagulants: Secondary | ICD-10-CM | POA: Diagnosis not present

## 2019-11-17 NOTE — Progress Notes (Deleted)
Location:  [place] Room Number: 414/D Place of Service:  SNF (31)  Hennie Duos, MD  Patient Care Team: Hennie Duos, MD as PCP - General (Internal Medicine) [name], MD as Consulting Physician (Nephrology) Rehab, [facility], [facility]  Extended Emergency Contact Information Primary Emergency Contact: [name],[name] Address: [place], [place] [address] [name] of [place] Phone: [phone] Work Phone: [phone] Mobile Phone: [phone] Relation: Daughter Secondary Emergency Contact: [name] Mobile Phone: [phone] Relation: Other    Allergies: Occlusive silicone sheets [silicone], Other, and Tape  Chief Complaint  Patient presents with  . Acute Visit    INR    HPI: Patient is 78 y.o. male who   Past Medical History:  Diagnosis Date  . A-fib ([place])   . Anemia   . Blood transfusion   . BPH (benign prostatic hyperplasia)   . CHF (congestive heart failure) ([place])   . Diarrhea   . DM (diabetes mellitus) ([place])   . ESRD on hemodialysis ([place])    Started dialysis in 2009  . History of GI bleed    secondary to coumadin  . HTN (hypertension)   . Hyperlipidemia   . OSA (obstructive sleep apnea)    uses CPAP  . Secondary hyperparathyroidism of renal origin [facility])     Past Surgical History:  Procedure Laterality Date  . ABDOMINAL AORTOGRAM N/A 11/15/2018   Procedure: ABDOMINAL AORTOGRAM;  Surgeon: [name], MD;  Location: [place] [facility];  Service: Cardiovascular;  Laterality: N/A;  . AMPUTATION Left 12/06/2018   Procedure: AMPUTATION DIGIT LEFT FIFTH TOE;  Surgeon: Angelia Mould, MD;  Location: [place];  Service: Vascular;  Laterality: Left;  . AMPUTATION Bilateral 12/29/2018   Procedure: AMPUTATION ABOVE KNEE;  Surgeon: Waynetta Sandy, MD;  Location: [place];  Service: Vascular;  Laterality:  Bilateral;  . APPLICATION OF WOUND VAC Right 02/17/2019   Procedure: Application Of Wound Vac;  Surgeon: Marty Heck, MD;  Location: [place];  Service: Vascular;  Laterality: Right;  . BVT  [id number]   Left  Basilic Vein Transposition  . CHOLECYSTECTOMY    . EYE SURGERY     Catarct bil  . I&D EXTREMITY Right 02/17/2019   Procedure: Right above the kneee debridement;  Surgeon: Marty Heck, MD;  Location: [place];  Service: Vascular;  Laterality: Right;  . INSERTION OF DIALYSIS CATHETER  05/28/2012   Procedure: INSERTION OF DIALYSIS CATHETER;  Surgeon: Mal Misty, MD;  Location: [place];  Service: Vascular;  Laterality: Right;  . Left arm shuntogram.    . Left forearm loop graft with 6 mm Gore-Tex graft.    . LOWER EXTREMITY ANGIOGRAPHY Bilateral 11/15/2018   Procedure: LOWER EXTREMITY ANGIOGRAPHY;  Surgeon: [name], MD;  Location: [place] [facility];  Service: Cardiovascular;  Laterality: Bilateral;  . Pars plana vitrectomy with 25-gauge system    . PERIPHERAL VASCULAR ATHERECTOMY Left 11/15/2018   Procedure: PERIPHERAL VASCULAR ATHERECTOMY;  Surgeon: [name], MD;  Location: [place] [facility];  Service: Cardiovascular;  Laterality: Left;  SFA with STENT  . PERIPHERAL VASCULAR BALLOON ANGIOPLASTY Left 11/15/2018   Procedure: PERIPHERAL VASCULAR BALLOON ANGIOPLASTY;  Surgeon: [name], MD;  Location: [place] [facility];  Service: Cardiovascular;  Laterality: Left;  PT    Allergies as of 11/17/2019  Reactions   Occlusive Silicone Sheets [silicone] Other (See Comments)   "Allergic," per [facility]   Other Other (See Comments)   Unknown reaction to Occlusive adhesive- "Allergic," per [facility]   Tape Itching, Other (See Comments)   Use Cloth tape only, please      Medication List       Accurate as of November 17, 2019  4:21 PM. If you have any questions, ask your nurse or doctor.        acetaminophen 500 MG tablet Commonly known as: TYLENOL Take 1,000  mg by mouth 2 (two) times daily.   ADULT NUTRITIONAL SUPPLEMENT PO Take 1 each by mouth daily. Magic Cup with dinner   NEPRO PO Take 1 Can by mouth daily.   atorvastatin 20 MG tablet Commonly known as: LIPITOR Take 20 mg by mouth daily.   calcium acetate (Phos Binder) 667 MG/5ML Soln Commonly known as: PHOSLYRA Take 667 mg by mouth 3 (three) times daily with meals.   dicyclomine 20 MG tablet Commonly known as: BENTYL Take 20 mg by mouth 3 (three) times daily before meals.   divalproex 125 MG capsule Commonly known as: DEPAKOTE SPRINKLE Take 125 mg by mouth every morning.   divalproex 125 MG capsule Commonly known as: DEPAKOTE SPRINKLE Take 250 mg by mouth at bedtime.   feeding supplement (PRO-STAT SUGAR FREE 64) Liqd Take 30 mLs by mouth 2 (two) times daily. 9a and 5p   Hectorol 4 MCG/2ML injection Generic drug: doxercalciferol Inject 2 mcg into the vein every Monday, Wednesday, and Friday with hemodialysis.   HYDROcodone-acetaminophen 5-325 MG tablet Commonly known as: Norco Take 1 tablet by mouth every 12 (twelve) hours.   loperamide 2 MG tablet Commonly known as: IMODIUM A-D Take 4 mg by mouth See admin instructions. Take 2 tablets ( 4 mg) by mouth after 1st loose stool as needed for diarrhea   metoprolol succinate 25 MG 24 hr tablet Commonly known as: TOPROL-XL Take 25 mg by mouth every Monday, Wednesday, and Friday. At bedtime   metoprolol tartrate 25 MG tablet Commonly known as: LOPRESSOR Take 25 mg by mouth See admin instructions. Take BID on Sun/Tues/Thurs/Sat   midodrine 10 MG tablet Commonly known as: PROAMATINE Take one tablet (10 mg) by mouth on Monday, Wednesday, Friday 30 minutes prior to dialysis.   multivitamin Tabs tablet Take 1 tablet by mouth at bedtime.   pantoprazole 40 MG tablet Commonly known as: PROTONIX Take 1 tablet (40 mg total) by mouth daily.   sevelamer carbonate 800 MG tablet Commonly known as: RENVELA Take 1,600 mg by  mouth 3 (three) times daily with meals.   Vitamin D3 1.25 MG (50000 UT) Caps Take 1 capsule by mouth once a week.   warfarin 2.5 MG tablet Commonly known as: COUMADIN Take 2.5 mg by mouth See admin instructions. Take on Sun-Tue-Thu-Fri-Sat   warfarin 5 MG tablet Commonly known as: COUMADIN Take 5 mg by mouth daily. Take on Mondays and Wednesdays       No orders of the defined types were placed in this encounter.   Immunization History  Administered Date(s) Administered  . Influenza-Unspecified 08/29/2019  . Pneumococcal Polysaccharide-23 10/07/2019    Social History   Tobacco Use  . Smoking status: Former Smoker    Types: Cigarettes    Quit date: 06/30/2004    Years since quitting: 15.3  . Smokeless tobacco: Never Used  Substance Use Topics  . Alcohol use: No     Vitals:   11/17/19 1612  BP: (!) 142/63  Pulse: 85  Resp: 18  Temp: (!) 97.3 F (36.3 C)  SpO2: 96%   Body mass index is 63.42 kg/m.   Patient Active Problem List   Diagnosis Date Noted  . Infection due to ESBL-producing Escherichia coli 08/12/2019  . MRSA cellulitis 08/12/2019  . Sacral decubitus ulcer, stage IV ([place]) 08/05/2019  . Infected decubitus ulcer, stage IV ([place]) 08/05/2019  . 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 End stage renal disease on dialysis due to type 2 diabetes mellitus ([place]) 03/24/2019  . 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 Anemia due to end stage renal disease ([place]) 03/24/2019  . Protein-calorie malnutrition, severe ([place]) 03/24/2019  . Hyperlipidemia associated with type 2 diabetes mellitus ([place]) 02/22/2019  . Amputation stump infection ([place]) 02/15/2019  . Conjunctivitis of left eye 01/04/2019  . 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[place]) 12/30/2018  . Open wound of left foot   . Encounter for monitoring Coumadin  therapy   . OSA (obstructive sleep apnea)   . HTN (hypertension)   . ESRD on hemodialysis ([place])   . BPH (benign prostatic hyperplasia)   . Hypotension 12/07/2018  . GERD (gastroesophageal reflux disease) 12/07/2018  . Pressure injury of skin 12/03/2018  . Symptomatic anemia 12/02/2018  . Acute on chronic blood loss anemia 12/02/2018  . 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 Lower GI bleeding 12/02/2018  . Heme positive stool   . Acute on chronic diastolic (congestive) heart failure ([place]) 11/19/2018  . Acute CVA (cerebrovascular accident) ([place]) 11/19/2018  . Hypoglycemia 11/19/2018  . Peripheral vascular disease due to secondary diabetes ([place]) 11/19/2018  . Dry gangrene ([place]) 11/19/2018  . 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 DNR (do not resuscitate) discussion   . Goals of care, counseling/discussion   . Palliative care by specialist   . Acute hypoxemic respiratory failure ([place]) 11/06/2018  . Volume overload 11/06/2018  . Multifocal pneumonia 11/06/2018  . Acute metabolic encephalopathy [id number]  . Physical deconditioning 11/06/2018  . Acute respiratory failure with hypoxia and hypercapnia ([place]) 11/04/2018  . CAP (community acquired pneumonia) 11/04/2018  . Pleural effusion, right 11/04/2018  . Increased ammonia level 11/04/2018  . Abnormal CT of the abdomen 11/04/2018  . Atrial fibrillation with RVR ([place]) 11/04/2018  . Sepsis ([place]) 10/21/2018  . Acute encephalopathy 10/21/2018  . Elevated alkaline phosphatase level 10/21/2018  . Chronic anemia 10/21/2018  . Thrombocytopenia ([place]) 10/21/2018  . Pulmonary HTN ([place]) 03/04/2017  . Aortic valve stenosis 03/04/2017  . Hypertension, accelerated with heart disease, without CHF 01/12/2017  . Dyspnea on exertion 01/12/2017  . ESRD (end stage renal disease) ([place]) 06/07/2012  . Hyperlipidemia 04/24/2011  . HYPERTENSION, BENIGN 10/24/2009  . Type 2 diabetes mellitus with hypertension and end  stage renal disease on dialysis ([place]) 10/23/2009  . OBSTRUCTIVE SLEEP APNEA 10/23/2009  . PAF (paroxysmal atrial fibrillation) ([place]) 10/23/2009  . CHF (congestive heart failure) ([place]) 10/23/2009    CMP     Component Value Date/Time   NA 136 08/16/2019 1818   NA 143 06/06/2019   K 4.0 08/16/2019 1818   CL 97 (L) 08/16/2019 1818   CO2 25 08/16/2019 1818   GLUCOSE 75 08/16/2019 1818   BUN 28 (H) 08/16/2019 1818   BUN 44 (A) 06/06/2019   CREATININE 3.59 (H) 08/16/2019 1818   CALCIUM 9.5 08/16/2019  1818   CALCIUM 8.3 (L) 10/25/2008 1450   PROT 7.8 08/16/2019 1818   ALBUMIN 2.9 (L) 08/16/2019 1818   AST 54 (H) 08/16/2019 1818   ALT 38 08/16/2019 1818   ALKPHOS 229 (H) 08/16/2019 1818   BILITOT 1.2 08/16/2019 1818   GFRNONAA 15 (L) 08/16/2019 1818   GFRAA 18 (L) 08/16/2019 1818   Recent Labs    02/20/19 1300  05/28/19 0253 05/29/19 0541 05/29/19 2316 05/30/19 0727 05/31/19 1319 06/06/19 07/27/19 1529 08/16/19 1818  NA 137   < > 137  --   --  137 136 143 138 136  K 3.0*   < > 3.7  --   --  5.1 3.6 3.7 3.5 4.0  CL 101   < > 102  --   --  101 98  --  95* 97*  CO2 23   < > 21*  --   --  17* 24  --  26 25  GLUCOSE 101*   < > 151*  --   --  99 140*  --  143* 75  BUN 27*   < > 80*  --   --  158* 44* 44* 48* 28*  CREATININE 3.41*   < > 6.12*  --   --  8.32* 4.29* 3.5* 4.61* 3.59*  CALCIUM 7.7*   < > 9.3  --   --  10.0 9.1  --  10.2 9.5  MG  --    < > 2.0 2.1 2.4  --   --   --   --   --   PHOS 2.4*  --   --   --   --  2.6 3.0  --   --   --    < > = values in this interval not displayed.   Recent Labs    05/27/19 0348 05/28/19 0253 05/30/19 0727 05/31/19 1319 08/16/19 1818  AST 55* 31  --   --  54*  ALT 55* 42  --   --  38  ALKPHOS 272* 261*  --   --  229*  BILITOT 0.8 0.6  --   --  1.2  PROT 7.0 6.5  --   --  7.8  ALBUMIN 2.4* 2.2* 2.3* 2.3* 2.9*   Recent Labs    05/31/19 1319 06/06/19 07/27/19 1529 08/16/19 1818  WBC 10.3 11.5 18.6* 6.9  NEUTROABS  --  8 15.5*  4.4  HGB 7.6* 9.3* 12.4* 11.0*  HCT 24.7* 29* 40.2 34.8*  MCV 88.5  --  89.3 89.5  PLT 220 251 171 193   No results for input(s): CHOL, LDLCALC, TRIG in the last 8760 hours.  Invalid input(s): HCL No results found for: [facility] Lab Results  Component Value Date   TSH 1.839 05/25/2019   Lab Results  Component Value Date   HGBA1C 4.7 09/08/2019   Lab Results  Component Value Date   CHOL 113 11/11/2018   HDL 39 (L) 11/11/2018   LDLCALC 62 11/11/2018   TRIG 60 11/11/2018   CHOLHDL 2.9 11/11/2018    Significant Diagnostic Results in last 30 days:  No results found.  Assessment and Plan  No problem-specific Assessment & Plan notes found for this encounter.   Labs/tests ordered:    Hennie Duos , MD

## 2019-11-19 ENCOUNTER — Encounter: Payer: Self-pay | Admitting: Internal Medicine

## 2019-11-19 NOTE — Progress Notes (Signed)
Location:  [place] Room Number: 414/D Place of Service:  SNF (31)  Hennie Duos, MD  Patient Care Team: Hennie Duos, MD as PCP - General (Internal Medicine) [name], MD as Consulting Physician (Nephrology) Rehab, [place] [facility], [facility] ([facility])  Extended Emergency Contact Information Primary Emergency Contact: [name],[name] Address: [place], [place] [address] [name] of [place] Phone: [phone] Work Phone: [phone] Mobile Phone: [phone] Relation: Daughter Secondary Emergency Contact: [name] Mobile Phone: [phone] Relation: Other    Allergies: Occlusive silicone sheets [silicone], Other, and Tape  Chief Complaint  Patient presents with  . Acute Visit    INR    HPI: Patient is 78 y.o. male who is being seen for PT/INR check.  Patient is on Coumadin for atrial fibrillation.  Past Medical History:  Diagnosis Date  . A-fib ([place])   . Anemia   . Blood transfusion   . BPH (benign prostatic hyperplasia)   . CHF (congestive heart failure) ([place])   . Diarrhea   . DM (diabetes mellitus) ([place])   . ESRD on hemodialysis ([place])    Started dialysis in 2009  . History of GI bleed    secondary to coumadin  . HTN (hypertension)   . Hyperlipidemia   . OSA (obstructive sleep apnea)    uses CPAP  . Secondary hyperparathyroidism of renal origin [facility])     Past Surgical History:  Procedure Laterality Date  . ABDOMINAL AORTOGRAM N/A 11/15/2018   Procedure: ABDOMINAL AORTOGRAM;  Surgeon: [name], MD;  Location: [place] CV LAB;  Service: Cardiovascular;  Laterality: N/A;  . AMPUTATION Left 12/06/2018   Procedure: AMPUTATION DIGIT LEFT FIFTH TOE;  Surgeon: Angelia Mould, MD;  Location: [place];  Service: Vascular;  Laterality: Left;  . AMPUTATION Bilateral 12/29/2018   Procedure: AMPUTATION ABOVE KNEE;  Surgeon: Waynetta Sandy, MD;  Location: [place];  Service: Vascular;  Laterality: Bilateral;  . APPLICATION OF WOUND VAC Right 02/17/2019   Procedure: Application Of Wound Vac;  Surgeon: Marty Heck, MD;  Location: [place];  Service: Vascular;  Laterality: Right;  . BVT  [id number]   Left  Basilic Vein Transposition  . CHOLECYSTECTOMY    . EYE SURGERY     Catarct bil  . I&D EXTREMITY Right 02/17/2019   Procedure: Right above the kneee debridement;  Surgeon: Marty Heck, MD;  Location: [place];  Service: Vascular;  Laterality: Right;  . INSERTION OF DIALYSIS CATHETER  05/28/2012   Procedure: INSERTION OF DIALYSIS CATHETER;  Surgeon: Mal Misty, MD;  Location: [place];  Service: Vascular;  Laterality: Right;  . Left arm shuntogram.    . Left forearm loop graft with 6 mm Gore-Tex graft.    . LOWER EXTREMITY ANGIOGRAPHY Bilateral 11/15/2018   Procedure: LOWER EXTREMITY ANGIOGRAPHY;  Surgeon: [name], MD;  Location: [place] CV LAB;  Service: Cardiovascular;  Laterality: Bilateral;  . Pars plana vitrectomy with 25-gauge system    . PERIPHERAL VASCULAR ATHERECTOMY Left 11/15/2018   Procedure: PERIPHERAL VASCULAR ATHERECTOMY;  Surgeon: [name], MD;  Location: [place] CV LAB;  Service: Cardiovascular;  Laterality: Left;  SFA with STENT  . PERIPHERAL VASCULAR BALLOON ANGIOPLASTY Left 11/15/2018   Procedure: PERIPHERAL VASCULAR BALLOON ANGIOPLASTY;  Surgeon: [name], MD;  Location: [place] CV LAB;  Service: Cardiovascular;  Laterality: Left;  PT    Allergies as of 11/17/2019      Reactions   Occlusive Silicone Sheets [silicone] Other (See Comments)   "Allergic," per [facility]   Other Other (See Comments)   Unknown reaction to Occlusive adhesive- "Allergic," per [facility]   Tape Itching, Other (See Comments)   Use Cloth tape only, please      Medication List       Accurate as of November 17, 2019 11:59 PM. If you have any questions, ask your nurse or doctor.         acetaminophen 500 MG tablet Commonly known as: TYLENOL Take 1,000 mg by mouth 2 (two) times daily.   ADULT NUTRITIONAL SUPPLEMENT PO Take 1 each by mouth daily. Magic Cup with dinner   NEPRO PO Take 1 Can by mouth daily.   atorvastatin 20 MG tablet Commonly known as: LIPITOR Take 20 mg by mouth daily.   calcium acetate (Phos Binder) 667 MG/5ML Soln Commonly known as: PHOSLYRA Take 667 mg by mouth 3 (three) times daily with meals.   dicyclomine 20 MG tablet Commonly known as: BENTYL Take 20 mg by mouth 3 (three) times daily before meals.   divalproex 125 MG capsule Commonly known as: DEPAKOTE SPRINKLE Take 125 mg by mouth every morning.   divalproex 125 MG capsule Commonly known as: DEPAKOTE SPRINKLE Take 250 mg by mouth at bedtime.   feeding supplement (PRO-STAT SUGAR FREE 64) Liqd Take 30 mLs by mouth 2 (two) times daily. 9a and 5p   Hectorol 4 MCG/2ML injection Generic drug: doxercalciferol Inject 2 mcg into the vein every Monday, Wednesday, and Friday with hemodialysis.   HYDROcodone-acetaminophen 5-325 MG tablet Commonly known as: Norco Take 1 tablet by mouth every 12 (twelve) hours.   loperamide 2 MG tablet Commonly known as: IMODIUM A-D Take 4 mg by mouth See admin instructions. Take 2 tablets ( 4 mg) by mouth after 1st loose stool as needed for diarrhea   metoprolol succinate 25 MG 24 hr tablet Commonly known as: TOPROL-XL Take 25 mg by mouth every Monday, Wednesday, and Friday. At bedtime   metoprolol tartrate 25 MG tablet Commonly known as: LOPRESSOR Take 25 mg by mouth See admin instructions. Take BID on Sun/Tues/Thurs/Sat   midodrine 10 MG tablet Commonly known as: PROAMATINE Take one tablet (10 mg) by mouth on Monday, Wednesday, Friday 30 minutes prior to dialysis.   multivitamin Tabs tablet Take 1 tablet by mouth at bedtime.   pantoprazole 40 MG tablet Commonly known as: PROTONIX Take 1 tablet (40 mg total) by mouth daily.    sevelamer carbonate 800 MG tablet Commonly known as: RENVELA Take 1,600 mg by mouth 3 (three) times daily with meals.   Vitamin D3 1.25 MG (50000 UT) Caps Take 1 capsule by mouth once a week.   warfarin 2.5 MG tablet Commonly known as: COUMADIN Take 2.5 mg by mouth See admin instructions. Take on Sun-Tue-Thu-Fri-Sat   warfarin 5 MG tablet Commonly known as: COUMADIN Take 5 mg by mouth daily. Take on Mondays and Wednesdays       No orders of the defined types were placed in this encounter.   Immunization History  Administered Date(s) Administered  . Influenza-Unspecified 08/29/2019  . Pneumococcal Polysaccharide-23 10/07/2019    Social History   Tobacco Use  . Smoking status: Former Smoker    Types: Cigarettes    Quit date: 06/30/2004    Years since quitting: 15.3  . Smokeless tobacco: Never Used  Substance Use Topics  .  Alcohol use: No     Vitals:   11/17/19 1612  BP: (!) 142/63  Pulse: 85  Resp: 18  Temp: (!) 97.3 F (36.3 C)  SpO2: 96%   Body mass index is 63.42 kg/m.   Patient Active Problem List   Diagnosis Date Noted  . Infection due to ESBL-producing Escherichia coli 08/12/2019  . MRSA cellulitis 08/12/2019  . Sacral decubitus ulcer, stage IV ([place]) 08/05/2019  . Infected decubitus ulcer, stage IV ([place]) 08/05/2019  . 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 Anemia due to end stage renal disease ([place]) 03/24/2019  . Protein-calorie malnutrition, severe ([place]) 03/24/2019  . Hyperlipidemia associated with type 2 diabetes mellitus ([place]) 02/22/2019  . Amputation stump infection ([place]) 02/15/2019  . Conjunctivitis of left eye 01/04/2019  . 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[place])  12/30/2018  . Open wound of left foot   . Encounter for monitoring Coumadin therapy   . OSA (obstructive sleep apnea)   . HTN (hypertension)   . ESRD on hemodialysis ([place])   . BPH (benign prostatic hyperplasia)   . Hypotension 12/07/2018  . GERD (gastroesophageal reflux disease) 12/07/2018  . Pressure injury of skin 12/03/2018  . Symptomatic anemia 12/02/2018  . Acute on chronic blood loss anemia 12/02/2018  . 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 Lower GI bleeding 12/02/2018  . Heme positive stool   . Acute on chronic diastolic (congestive) heart failure ([place]) 11/19/2018  . Acute CVA (cerebrovascular accident) ([place]) 11/19/2018  . Hypoglycemia 11/19/2018  . Peripheral vascular disease due to secondary diabetes ([place]) 11/19/2018  . Dry gangrene ([place]) 11/19/2018  . 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 DNR (do not resuscitate) discussion   . Goals of care, counseling/discussion   . Palliative care by specialist   . Acute hypoxemic respiratory failure ([place]) 11/06/2018  . Volume overload 11/06/2018  . Multifocal pneumonia 11/06/2018  . Acute metabolic encephalopathy [id number]  . Physical deconditioning 11/06/2018  . Acute respiratory failure with hypoxia and hypercapnia ([place]) 11/04/2018  . CAP (community acquired pneumonia) 11/04/2018  . Pleural effusion, right 11/04/2018  . Increased ammonia level 11/04/2018  . Abnormal CT of the abdomen 11/04/2018  . Atrial fibrillation with RVR ([place]) 11/04/2018  . Sepsis ([place]) 10/21/2018  . Acute encephalopathy 10/21/2018  . Elevated alkaline phosphatase level 10/21/2018  . Chronic anemia 10/21/2018  . Thrombocytopenia ([place]) 10/21/2018  . Pulmonary HTN ([place]) 03/04/2017  . Aortic valve stenosis 03/04/2017  . Hypertension, accelerated with heart disease, without CHF 01/12/2017  . Dyspnea on exertion 01/12/2017  . ESRD (end stage renal disease) ([place]) 06/07/2012  . Hyperlipidemia 04/24/2011  .  HYPERTENSION, BENIGN 10/24/2009  . Type 2 diabetes mellitus with hypertension and end stage renal disease on dialysis ([place]) 10/23/2009  . OBSTRUCTIVE SLEEP APNEA 10/23/2009  . PAF (paroxysmal atrial fibrillation) ([place]) 10/23/2009  . CHF (congestive heart failure) ([place]) 10/23/2009    CMP     Component Value Date/Time   NA 136 08/16/2019 1818   NA 143 06/06/2019   K 4.0 08/16/2019 1818   CL 97 (L) 08/16/2019 1818   CO2 25 08/16/2019 1818   GLUCOSE 75 08/16/2019 1818   BUN 28 (H) 08/16/2019 1818   BUN 44 (A)  06/06/2019   CREATININE 3.59 (H) 08/16/2019 1818   CALCIUM 9.5 08/16/2019 1818   CALCIUM 8.3 (L) 10/25/2008 1450   PROT 7.8 08/16/2019 1818   ALBUMIN 2.9 (L) 08/16/2019 1818   AST 54 (H) 08/16/2019 1818   ALT 38 08/16/2019 1818   ALKPHOS 229 (H) 08/16/2019 1818   BILITOT 1.2 08/16/2019 1818   GFRNONAA 15 (L) 08/16/2019 1818   GFRAA 18 (L) 08/16/2019 1818   Recent Labs    02/20/19 1300  05/28/19 0253 05/29/19 0541 05/29/19 2316 05/30/19 0727 05/31/19 1319 06/06/19 07/27/19 1529 08/16/19 1818  NA 137   < > 137  --   --  137 136 143 138 136  K 3.0*   < > 3.7  --   --  5.1 3.6 3.7 3.5 4.0  CL 101   < > 102  --   --  101 98  --  95* 97*  CO2 23   < > 21*  --   --  17* 24  --  26 25  GLUCOSE 101*   < > 151*  --   --  99 140*  --  143* 75  BUN 27*   < > 80*  --   --  158* 44* 44* 48* 28*  CREATININE 3.41*   < > 6.12*  --   --  8.32* 4.29* 3.5* 4.61* 3.59*  CALCIUM 7.7*   < > 9.3  --   --  10.0 9.1  --  10.2 9.5  MG  --    < > 2.0 2.1 2.4  --   --   --   --   --   PHOS 2.4*  --   --   --   --  2.6 3.0  --   --   --    < > = values in this interval not displayed.   Recent Labs    05/27/19 0348 05/28/19 0253 05/30/19 0727 05/31/19 1319 08/16/19 1818  AST 55* 31  --   --  54*  ALT 55* 42  --   --  38  ALKPHOS 272* 261*  --   --  229*  BILITOT 0.8 0.6  --   --  1.2  PROT 7.0 6.5  --   --  7.8  ALBUMIN 2.4* 2.2* 2.3* 2.3* 2.9*   Recent Labs    05/31/19 1319  06/06/19 07/27/19 1529 08/16/19 1818  WBC 10.3 11.5 18.6* 6.9  NEUTROABS  --  8 15.5* 4.4  HGB 7.6* 9.3* 12.4* 11.0*  HCT 24.7* 29* 40.2 34.8*  MCV 88.5  --  89.3 89.5  PLT 220 251 171 193   No results for input(s): CHOL, LDLCALC, TRIG in the last 8760 hours.  Invalid input(s): HCL No results found for: [facility] Lab Results  Component Value Date   TSH 1.839 05/25/2019   Lab Results  Component Value Date   HGBA1C 4.7 09/08/2019   Lab Results  Component Value Date   CHOL 113 11/11/2018   HDL 39 (L) 11/11/2018   LDLCALC 62 11/11/2018   TRIG 60 11/11/2018   CHOLHDL 2.9 11/11/2018    Significant Diagnostic Results in last 30 days:  No results found.  Assessment and Plan  Atrial fibrillation/anticoagulation Coumadin-patient's INR today is 2.4 which is very good; continue 5 mg Monday Wednesday and 2.5 mg every other day     Hennie Duos , MD

## 2019-11-28 ENCOUNTER — Encounter: Payer: Self-pay | Admitting: Internal Medicine

## 2019-11-28 ENCOUNTER — Non-Acute Institutional Stay (SKILLED_NURSING_FACILITY): Payer: Medicare Other | Admitting: Internal Medicine

## 2019-11-28 DIAGNOSIS — I480 Paroxysmal atrial fibrillation: Secondary | ICD-10-CM

## 2019-11-28 DIAGNOSIS — Z7901 Long term (current) use of anticoagulants: Secondary | ICD-10-CM | POA: Diagnosis not present

## 2019-11-28 NOTE — Progress Notes (Signed)
Location:   [facility] Service:   SNF  [name], Noah Delaine, MD  Patient Care Team: Hennie Duos, MD as PCP - General (Internal Medicine) [name], MD as Consulting Physician (Nephrology) Rehab, [place] ([place], [facility]  Extended Emergency Contact Information Primary Emergency Contact: [name],[name] Address: [place],  [address] [name] of [place] Phone: [phone] Work Phone: [phone] Mobile Phone: [phone] Relation: Daughter Secondary Emergency Contact: [name] Mobile Phone: [phone] Relation: Other    Allergies: Occlusive silicone sheets [silicone], Other, and Tape  Chief Complaint  Patient presents with  . Acute Visit    HPI: Patient is 78 y.o. male who is being seen for INR check.  Patient's INR is 2.2 on 5 mg of Coumadin Monday Wednesday Friday and 2.5 mg of Coumadin every other day.  Patient has no complaints  Past Medical History:  Diagnosis Date  . A-fib ([place])   . Anemia   . Blood transfusion   . BPH (benign prostatic hyperplasia)   . CHF (congestive heart failure) ([place])   . Diarrhea   . DM (diabetes mellitus) ([place])   . ESRD on hemodialysis ([place])    Started dialysis in 2009  . History of GI bleed    secondary to coumadin  . HTN (hypertension)   . Hyperlipidemia   . OSA (obstructive sleep apnea)    uses CPAP  . Secondary hyperparathyroidism of renal origin [facility])     Past Surgical History:  Procedure Laterality Date  . ABDOMINAL AORTOGRAM N/A 11/15/2018   Procedure: ABDOMINAL AORTOGRAM;  Surgeon: [name], MD;  Location: [place] CV LAB;  Service: Cardiovascular;  Laterality: N/A;  . AMPUTATION Left 12/06/2018   Procedure: AMPUTATION DIGIT LEFT FIFTH TOE;  Surgeon: Angelia Mould, MD;  Location: [place];  Service: Vascular;  Laterality: Left;  . AMPUTATION Bilateral 12/29/2018   Procedure: AMPUTATION ABOVE  KNEE;  Surgeon: Waynetta Sandy, MD;  Location: [place];  Service: Vascular;  Laterality: Bilateral;  . APPLICATION OF WOUND VAC Right 02/17/2019   Procedure: Application Of Wound Vac;  Surgeon: Marty Heck, MD;  Location: [place];  Service: Vascular;  Laterality: Right;  . BVT  123456   Left  Basilic Vein Transposition  . CHOLECYSTECTOMY    . EYE SURGERY     Catarct bil  . I&D EXTREMITY Right 02/17/2019   Procedure: Right above the kneee debridement;  Surgeon: Marty Heck, MD;  Location: [place];  Service: Vascular;  Laterality: Right;  . INSERTION OF DIALYSIS CATHETER  05/28/2012   Procedure: INSERTION OF DIALYSIS CATHETER;  Surgeon: Mal Misty, MD;  Location: [place];  Service: Vascular;  Laterality: Right;  . Left arm shuntogram.    . Left forearm loop graft with 6 mm Gore-Tex graft.    . LOWER EXTREMITY ANGIOGRAPHY Bilateral 11/15/2018   Procedure: LOWER EXTREMITY ANGIOGRAPHY;  Surgeon: [name], MD;  Location: [place] [facility] LAB;  Service: Cardiovascular;  Laterality: Bilateral;  . Pars plana vitrectomy with 25-gauge system    . PERIPHERAL VASCULAR ATHERECTOMY Left 11/15/2018   Procedure: PERIPHERAL VASCULAR ATHERECTOMY;  Surgeon: [name], MD;  Location: [place] CV LAB;  Service: Cardiovascular;  Laterality: Left;  SFA with STENT  . PERIPHERAL VASCULAR BALLOON ANGIOPLASTY Left 11/15/2018   Procedure: PERIPHERAL VASCULAR BALLOON ANGIOPLASTY;  Surgeon: [name], MD;  Location: [facility]  INVASIVE CV LAB;  Service: Cardiovascular;  Laterality: Left;  PT    Allergies as of 11/28/2019      Reactions   Occlusive Silicone Sheets [silicone] Other (See Comments)   "Allergic," per [facility]   Other Other (See Comments)   Unknown reaction to Occlusive adhesive- "Allergic," per MAR   Tape Itching, Other (See Comments)   Use Cloth tape only, please      Medication List       Accurate as of November 28, 2019 11:59 PM. If you have any questions, ask your  nurse or doctor.        acetaminophen 500 MG tablet Commonly known as: TYLENOL Take 1,000 mg by mouth 2 (two) times daily.   ADULT NUTRITIONAL SUPPLEMENT PO Take 1 each by mouth daily. Magic Cup with dinner   NEPRO PO Take 1 Can by mouth daily.   atorvastatin 20 MG tablet Commonly known as: LIPITOR Take 20 mg by mouth daily.   calcium acetate (Phos Binder) 667 MG/5ML Soln Commonly known as: PHOSLYRA Take 667 mg by mouth 3 (three) times daily with meals.   dicyclomine 20 MG tablet Commonly known as: BENTYL Take 20 mg by mouth 3 (three) times daily before meals.   divalproex 125 MG capsule Commonly known as: DEPAKOTE SPRINKLE Take 125 mg by mouth every morning.   divalproex 125 MG capsule Commonly known as: DEPAKOTE SPRINKLE Take 250 mg by mouth at bedtime.   feeding supplement (PRO-STAT SUGAR FREE 64) Liqd Take 30 mLs by mouth 2 (two) times daily. 9a and 5p   Hectorol 4 MCG/2ML injection Generic drug: doxercalciferol Inject 2 mcg into the vein every Monday, Wednesday, and Friday with hemodialysis.   HYDROcodone-acetaminophen 5-325 MG tablet Commonly known as: Norco Take 1 tablet by mouth every 12 (twelve) hours.   loperamide 2 MG tablet Commonly known as: IMODIUM A-D Take 4 mg by mouth See admin instructions. Take 2 tablets ( 4 mg) by mouth after 1st loose stool as needed for diarrhea   metoprolol succinate 25 MG 24 hr tablet Commonly known as: TOPROL-XL Take 25 mg by mouth every Monday, Wednesday, and Friday. At bedtime   metoprolol tartrate 25 MG tablet Commonly known as: LOPRESSOR Take 25 mg by mouth See admin instructions. Take BID on Sun/Tues/Thurs/Sat   midodrine 10 MG tablet Commonly known as: PROAMATINE Take one tablet (10 mg) by mouth on Monday, Wednesday, Friday 30 minutes prior to dialysis.   multivitamin Tabs tablet Take 1 tablet by mouth at bedtime.   pantoprazole 40 MG tablet Commonly known as: PROTONIX Take 1 tablet (40 mg total) by  mouth daily.   sevelamer carbonate 800 MG tablet Commonly known as: RENVELA Take 1,600 mg by mouth 3 (three) times daily with meals.   Vitamin D3 1.25 MG (50000 UT) Caps Take 1 capsule by mouth once a week.   warfarin 2.5 MG tablet Commonly known as: COUMADIN Take 2.5 mg by mouth See admin instructions. Take on Sun-Tue-Thu-Fri-Sat   warfarin 5 MG tablet Commonly known as: COUMADIN Take 5 mg by mouth daily. Take on Mondays and Wednesdays       No orders of the defined types were placed in this encounter.   Immunization History  Administered Date(s) Administered  . Influenza-Unspecified 08/29/2019  . Pneumococcal Polysaccharide-23 10/07/2019    Social History   Tobacco Use  . Smoking status: Former Smoker    Types: Cigarettes    Quit date: 06/30/2004    Years since quitting: 15.4  .  Smokeless tobacco: Never Used  Substance Use Topics  . Alcohol use: No     Vitals:   11/28/19 1535  BP: (!) 103/57  Pulse: 78  Resp: 17  Temp: (!) 97.2 F (36.2 C)   Body mass index is 63.42 kg/m.   Patient Active Problem List   Diagnosis Date Noted  . Infection due to ESBL-producing Escherichia coli 08/12/2019  . MRSA cellulitis 08/12/2019  . Sacral decubitus ulcer, stage IV ([place]) 08/05/2019  . Infected decubitus ulcer, stage IV ([place]) 08/05/2019  . Hypertensive heart and kidney disease with chronic diastolic congestive heart failure and stage 5 chronic kidney disease on chronic dialysis ([place]) 03/24/2019  . 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 Anemia due to end stage renal disease ([place]) 03/24/2019  . Protein-calorie malnutrition, severe ([place]) 03/24/2019  . Hyperlipidemia associated with type 2 diabetes mellitus ([place]) 02/22/2019  . Amputation stump infection ([place]) 02/15/2019  . Conjunctivitis of left eye 01/04/2019  . S/P AKA (above knee amputation) bilateral  ([place]) 12/30/2018  . Open wound of left foot   . Encounter for monitoring Coumadin therapy   . OSA (obstructive sleep apnea)   . HTN (hypertension)   . ESRD on hemodialysis ([place])   . BPH (benign prostatic hyperplasia)   . Hypotension 12/07/2018  . GERD (gastroesophageal reflux disease) 12/07/2018  . Pressure injury of skin 12/03/2018  . Symptomatic anemia 12/02/2018  . Acute on chronic blood loss anemia 12/02/2018  . 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[place]) 12/02/2018  . Lower GI bleeding 12/02/2018  . Heme positive stool   . Acute on chronic diastolic (congestive) heart failure ([place]) 11/19/2018  . Acute CVA (cerebrovascular accident) ([place]) 11/19/2018  . Hypoglycemia 11/19/2018  . Peripheral vascular disease due to secondary diabetes ([place]) 11/19/2018  . Dry gangrene ([place]) 11/19/2018  . 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 DNR (do not resuscitate) discussion   . Goals of care, counseling/discussion   . Palliative care by specialist   . Acute hypoxemic respiratory failure ([place]) 11/06/2018  . Volume overload 11/06/2018  . Multifocal pneumonia 11/06/2018  . Acute metabolic encephalopathy [id number]  . Physical deconditioning 11/06/2018  . Acute respiratory failure with hypoxia and hypercapnia ([place]) 11/04/2018  . CAP (community acquired pneumonia) 11/04/2018  . Pleural effusion, right 11/04/2018  . Increased ammonia level 11/04/2018  . Abnormal CT of the abdomen 11/04/2018  . Atrial fibrillation with RVR ([place]) 11/04/2018  . Sepsis ([place]) 10/21/2018  . Acute encephalopathy 10/21/2018  . Elevated alkaline phosphatase level 10/21/2018  . Chronic anemia 10/21/2018  . Thrombocytopenia ([place]) 10/21/2018  . Pulmonary HTN ([place]) 03/04/2017  . Aortic valve stenosis 03/04/2017  . Hypertension, accelerated with heart disease, without CHF 01/12/2017  . Dyspnea on exertion 01/12/2017  . ESRD (end stage renal disease) ([place]) 06/07/2012  . Hyperlipidemia 04/24/2011  .  HYPERTENSION, BENIGN 10/24/2009  . Type 2 diabetes mellitus with hypertension and end stage renal disease on dialysis ([place]) 10/23/2009  . OBSTRUCTIVE SLEEP APNEA 10/23/2009  . PAF (paroxysmal atrial fibrillation) ([place]) 10/23/2009  . CHF (congestive heart failure) ([place]) 10/23/2009    CMP     Component Value Date/Time   NA 136 08/16/2019 1818   NA 143 06/06/2019   K 4.0 08/16/2019 1818   CL 97 (L) 08/16/2019 1818   CO2 25 08/16/2019 1818   GLUCOSE 75 08/16/2019 1818   BUN 28 (H)  08/16/2019 1818   BUN 44 (A) 06/06/2019   CREATININE 3.59 (H) 08/16/2019 1818   CALCIUM 9.5 08/16/2019 1818   CALCIUM 8.3 (L) 10/25/2008 1450   PROT 7.8 08/16/2019 1818   ALBUMIN 2.9 (L) 08/16/2019 1818   AST 54 (H) 08/16/2019 1818   ALT 38 08/16/2019 1818   ALKPHOS 229 (H) 08/16/2019 1818   BILITOT 1.2 08/16/2019 1818   GFRNONAA 15 (L) 08/16/2019 1818   GFRAA 18 (L) 08/16/2019 1818   Recent Labs    02/20/19 1300  05/28/19 0253 05/29/19 0541 05/29/19 2316 05/30/19 0727 05/31/19 1319 06/06/19 07/27/19 1529 08/16/19 1818  NA 137   < > 137  --   --  137 136 143 138 136  K 3.0*   < > 3.7  --   --  5.1 3.6 3.7 3.5 4.0  CL 101   < > 102  --   --  101 98  --  95* 97*  CO2 23   < > 21*  --   --  17* 24  --  26 25  GLUCOSE 101*   < > 151*  --   --  99 140*  --  143* 75  BUN 27*   < > 80*  --   --  158* 44* 44* 48* 28*  CREATININE 3.41*   < > 6.12*  --   --  8.32* 4.29* 3.5* 4.61* 3.59*  CALCIUM 7.7*   < > 9.3  --   --  10.0 9.1  --  10.2 9.5  MG  --    < > 2.0 2.1 2.4  --   --   --   --   --   PHOS 2.4*  --   --   --   --  2.6 3.0  --   --   --    < > = values in this interval not displayed.   Recent Labs    05/27/19 0348 05/28/19 0253 05/30/19 0727 05/31/19 1319 08/16/19 1818  AST 55* 31  --   --  54*  ALT 55* 42  --   --  38  ALKPHOS 272* 261*  --   --  229*  BILITOT 0.8 0.6  --   --  1.2  PROT 7.0 6.5  --   --  7.8  ALBUMIN 2.4* 2.2* 2.3* 2.3* 2.9*   Recent Labs    05/31/19 1319  06/06/19 07/27/19 1529 08/16/19 1818  WBC 10.3 11.5 18.6* 6.9  NEUTROABS  --  8 15.5* 4.4  HGB 7.6* 9.3* 12.4* 11.0*  HCT 24.7* 29* 40.2 34.8*  MCV 88.5  --  89.3 89.5  PLT 220 251 171 193   No results for input(s): CHOL, LDLCALC, TRIG in the last 8760 hours.  Invalid input(s): HCL No results found for: [facility] Lab Results  Component Value Date   TSH 1.839 05/25/2019   Lab Results  Component Value Date   HGBA1C 4.7 09/08/2019   Lab Results  Component Value Date   CHOL 113 11/11/2018   HDL 39 (L) 11/11/2018   LDLCALC 62 11/11/2018   TRIG 60 11/11/2018   CHOLHDL 2.9 11/11/2018    Significant Diagnostic Results in last 30 days:  No results found.  Assessment and Plan  Paroxysmal atrial fibrillation/anticoagulated on Coumadin-patient's INR is 2.2 which is exactly where we would want him to be; will continue current regimen of 5 mg Monday Wednesday Friday and 2.5 mg of Coumadin all other days  repeat INR 1 week.    Hennie Duos , MD

## 2019-12-03 ENCOUNTER — Encounter: Payer: Self-pay | Admitting: Internal Medicine

## 2019-12-05 ENCOUNTER — Non-Acute Institutional Stay (SKILLED_NURSING_FACILITY): Payer: Medicare Other | Admitting: Internal Medicine

## 2019-12-05 ENCOUNTER — Encounter: Payer: Self-pay | Admitting: Internal Medicine

## 2019-12-05 DIAGNOSIS — Z89611 Acquired absence of right leg above knee: Secondary | ICD-10-CM

## 2019-12-05 DIAGNOSIS — E1122 Type 2 diabetes mellitus with diabetic chronic kidney disease: Secondary | ICD-10-CM

## 2019-12-05 DIAGNOSIS — I480 Paroxysmal atrial fibrillation: Secondary | ICD-10-CM | POA: Diagnosis not present

## 2019-12-05 DIAGNOSIS — Z992 Dependence on renal dialysis: Secondary | ICD-10-CM

## 2019-12-05 DIAGNOSIS — Z89612 Acquired absence of left leg above knee: Secondary | ICD-10-CM

## 2019-12-05 DIAGNOSIS — Z7901 Long term (current) use of anticoagulants: Secondary | ICD-10-CM

## 2019-12-05 DIAGNOSIS — Z5181 Encounter for therapeutic drug level monitoring: Secondary | ICD-10-CM

## 2019-12-05 DIAGNOSIS — I5032 Chronic diastolic (congestive) heart failure: Secondary | ICD-10-CM

## 2019-12-05 DIAGNOSIS — I132 Hypertensive heart and chronic kidney disease with heart failure and with stage 5 chronic kidney disease, or end stage renal disease: Secondary | ICD-10-CM

## 2019-12-05 DIAGNOSIS — N186 End stage renal disease: Secondary | ICD-10-CM

## 2019-12-05 NOTE — Progress Notes (Signed)
This is a routine visit.  Level of care is skilled.  [facility]  Chief complaint-routine visit for medical management of chronic medical conditions including atrial fibrillation-end-stage renal disease on dialysis-peripheral vascular disease status post AKA bilateral-hypertension-and Alzheimer's dementia and CHF  History of present illness.  Patient is a 78 year old male with the above diagnoses-nursing does not report any recent acute issues.  He was recently started on Depakote twice daily for apparent some increased behaviors-apparently was taking his clothes off climbing out of bed thinking he can walk also apparently made some sexually suggestive statements.  Apparently this has moderated on the Depakote.  Nursing has not reported any recent issues.  He did have an INR done today which is 2.3 which shows stability is on Coumadin with a history of atrial fibrillation-it appears rate controlled on Lopressor.  Regards to other issues he continues on dialysis 3 days a week with a history of end-stage renal disease.  He has bilateral AKA's with a history of peripheral vascular disease and does receive Vicodin for pain which appears to be effective.  Currently he is resting in bed comfortably he is about to eat his lunch vital signs appear to be stable   Past Medical History:  Diagnosis Date   A-fib ([place])    Anemia    Blood transfusion    BPH (benign prostatic hyperplasia)    CHF (congestive heart failure) ([place])    Diarrhea    DM (diabetes mellitus) ([place])    ESRD on hemodialysis ([place])    Started dialysis in 2009   History of GI bleed    secondary to coumadin   HTN (hypertension)    Hyperlipidemia    OSA (obstructive sleep apnea)    uses CPAP   Secondary hyperparathyroidism of renal origin [facility])          Past Surgical History:  Procedure Laterality Date   ABDOMINAL AORTOGRAM N/A 11/15/2018   Procedure: ABDOMINAL  AORTOGRAM;  Surgeon: [name], MD;  Location: [place] [facility] LAB;  Service: Cardiovascular;  Laterality: N/A;   AMPUTATION Left 12/06/2018   Procedure: AMPUTATION DIGIT LEFT FIFTH TOE;  Surgeon: Angelia Mould, MD;  Location: [place];  Service: Vascular;  Laterality: Left;   AMPUTATION Bilateral 12/29/2018   Procedure: AMPUTATION ABOVE KNEE;  Surgeon: Waynetta Sandy, MD;  Location: [place];  Service: Vascular;  Laterality: Bilateral;   APPLICATION OF WOUND VAC Right 02/17/2019   Procedure: Application Of Wound Vac;  Surgeon: Marty Heck, MD;  Location: [place];  Service: Vascular;  Laterality: Right;   BVT  123456   Left  Basilic Vein Transposition   CHOLECYSTECTOMY     EYE SURGERY     Catarct bil   I&D EXTREMITY Right 02/17/2019   Procedure: Right above the kneee debridement;  Surgeon: Marty Heck, MD;  Location: [place];  Service: Vascular;  Laterality: Right;   INSERTION OF DIALYSIS CATHETER  05/28/2012   Procedure: INSERTION OF DIALYSIS CATHETER;  Surgeon: Mal Misty, MD;  Location: [facility] OR;  Service: Vascular;  Laterality: Right;   Left arm shuntogram.     Left forearm loop graft with 6 mm Gore-Tex graft.     LOWER EXTREMITY ANGIOGRAPHY Bilateral 11/15/2018   Procedure: LOWER EXTREMITY ANGIOGRAPHY;  Surgeon: [name], MD;  Location: [place] CV LAB;  Service: Cardiovascular;  Laterality: Bilateral;   Pars plana vitrectomy with 25-gauge system     PERIPHERAL VASCULAR ATHERECTOMY Left 11/15/2018  Procedure: PERIPHERAL VASCULAR ATHERECTOMY;  Surgeon: [name], MD;  Location: [place] CV LAB;  Service: Cardiovascular;  Laterality: Left;  SFA with STENT   PERIPHERAL VASCULAR BALLOON ANGIOPLASTY Left 11/15/2018   Procedure: PERIPHERAL VASCULAR BALLOON ANGIOPLASTY;  Surgeon: [name], MD;  Location: [place] CV LAB;  Service: Cardiovascular;  Laterality: Left;  PT         Allergies as of  10/27/2019      Reactions   Occlusive Silicone Sheets [silicone] Other (See Comments)   "Allergic," per [facility]   Other Other (See Comments)   Unknown reaction to Occlusive adhesive- "Allergic," per MAR   Tape Itching, Other (See Comments)   Use Cloth tape only, please     Immunization History  Administered Date(s) Administered   Influenza-Unspecified 08/29/2019   Pneumococcal Polysaccharide-23 10/07/2019    Social History        Tobacco Use   Smoking status: Former Smoker    Types: Cigarettes    Quit date: 06/30/2004    Years since quitting: 15.3   Smokeless tobacco: Never Used  Substance Use Topics   Alcohol use: No    Medication List             acetaminophen 500 MG tablet Commonly known as: TYLENOL Take 1,000 mg by mouth 2 (two) times daily.   ADULT NUTRITIONAL SUPPLEMENT PO Take 1 each by mouth daily. Magic Cup with dinner   NEPRO PO Take 1 Can by mouth daily.   atorvastatin 20 MG tablet Commonly known as: LIPITOR Take 20 mg by mouth daily.   calcium acetate 667 MG capsule Commonly known as: PHOSLO Take 667 mg by mouth 3 (three) times daily with meals.   dicyclomine 20 MG tablet Commonly known as: BENTYL Take 20 mg by mouth 3 (three) times daily before meals.   feeding supplement (PRO-STAT SUGAR FREE 64) Liqd Take 30 mLs by mouth 2 (two) times daily. 9a and 5p   Hectorol 4 MCG/2ML injection Generic drug: doxercalciferol Inject 2 mcg into the vein every Monday, Wednesday, and Friday with hemodialysis.   HYDROcodone-acetaminophen 5-325 MG tablet Commonly known as: Norco Take 1 tablet by mouth every 12 (twelve) hours.   loperamide 2 MG tablet Commonly known as: IMODIUM A-D Take 4 mg by mouth See admin instructions. Take 2 tablets ( 4 mg) by mouth after 1st loose stool as needed for diarrhea   metoprolol succinate 25 MG 24 hr tablet Commonly known as: TOPROL-XL Take 25 mg by mouth every Monday, Wednesday, and  Friday. At bedtime   metoprolol tartrate 25 MG tablet Commonly known as: LOPRESSOR Take 25 mg by mouth two times a day on Sun/Tues/Thurs/Sat   midodrine 10 MG tablet Commonly known as: PROAMATINE Take one tablet (10 mg) by mouth on Monday, Wednesday, Friday 30 minutes prior to dialysis.   pantoprazole 40 MG tablet Commonly known as: PROTONIX Take 1 tablet (40 mg total) by mouth daily.   RENAL VITAMIN PO Take 1 tablet by mouth at bedtime.   Vitamin D3 1.25 MG (50000 UT) Caps Take 1 capsule by mouth once a week.   warfarin 2.5 MG tablet Commonly known as: COUMADIN Take 2.5 mg by mouth See admin instructions. Take on Sun-Tue-Thu-Sat   warfarin 5 MG tablet Commonly known as: COUMADIN Take 5 mg by mouth daily. Take on Mondays and Wednesdays and Fridays      Review of systems-this is somewhat limited secondary to patient being a poor historian.  In general not  complaining of any fever or chills looking forward to eating his lunch.  Skin does not complain of rashes itching or increased bruising or bleeding.  Head ears eyes nose mouth and throat does not complain of visual changes or sore throat or difficulty swallowing.  Respiratory does not complain of shortness of breath or cough.  Cardiac denies chest pain or edema.  GI does not complain of abdominal pain no nausea vomiting diarrhea constipation has been noted.  GU does not complain of dysuria does have end-stage renal disease on dialysis.  Musculoskeletal bilateral AKA's does not complain of pain currently.  Neurologic does not complain of dizziness headache or increased weakness beyond baseline.  And psych does have some history of dementia with behaviors this appears to have moderated he is not complaining of being depressed or anxious he is pleasant and appropriate today.  Physical exam.   Temperature is 97.0 pulse 69 respirations 16 blood pressure 125/63 weight is 118.8  In general this is a pleasant  elderly male in no distress resting comfortably in bed.  His skin is warm and dry he does have shunt sites which appear unremarkable I do not notice any increased bruising or bleeding.  Eyes visual acuity appears to be intact sclera conjunctivitis shows baseline icteric changes.  Oropharynx is clear mucous membranes moist she has numerous extractions.  Chest is clear to auscultation there is no labored breathing.  Heart is regular irregular rate and rhythm with a baseline systolic murmur he does not appear to have significant increased edema he does have bilateral AKA's.  Abdomen is soft nontender with active bowel sounds.  Musculoskeletal does have bilateral AKA's at baseline moves his upper extremities with baseline strength and range of motion.  Neurologic is grossly intact cannot appreciate lateralizing findings his speech is clear.  Psych he is pleasant and appropriate with some cognitive deficits at baseline.  Labs.  December 05, 2019.  INR 2.3.  Last week INR was 2.2 -- previous to that was 1.7  Recent Labs    02/20/19 1300  05/28/19 0253 05/29/19 0541 05/29/19 2316 05/30/19 0727 05/31/19 1319 06/06/19 07/27/19 1529 08/16/19 1818  NA 137   < > 137  --   --  137 136 143 138 136  K 3.0*   < > 3.7  --   --  5.1 3.6 3.7 3.5 4.0  CL 101   < > 102  --   --  101 98  --  95* 97*  CO2 23   < > 21*  --   --  17* 24  --  26 25  GLUCOSE 101*   < > 151*  --   --  99 140*  --  143* 75  BUN 27*   < > 80*  --   --  158* 44* 44* 48* 28*  CREATININE 3.41*   < > 6.12*  --   --  8.32* 4.29* 3.5* 4.61* 3.59*  CALCIUM 7.7*   < > 9.3  --   --  10.0 9.1  --  10.2 9.5  MG  --    < > 2.0 2.1 2.4  --   --   --   --   --   PHOS 2.4*  --   --   --   --  2.6 3.0  --   --   --    < > = values in this interval not displayed.     Recent Labs (within last  365 days)         Recent Labs    05/27/19 0348 05/28/19 0253 05/30/19 0727 05/31/19 1319 08/16/19 1818  AST 55* 31  --   --  54*    ALT 55* 42  --   --  38  ALKPHOS 272* 261*  --   --  229*  BILITOT 0.8 0.6  --   --  1.2  PROT 7.0 6.5  --   --  7.8  ALBUMIN 2.4* 2.2* 2.3* 2.3* 2.9*     Recent Labs (within last 365 days)        Recent Labs    05/31/19 1319 06/06/19 07/27/19 1529 08/16/19 1818  WBC 10.3 11.5 18.6* 6.9  NEUTROABS  --  8 15.5* 4.4  HGB 7.6* 9.3* 12.4* 11.0*  HCT 24.7* 29* 40.2 34.8*  MCV 88.5  --  89.3 89.5  PLT 220 251 171 193          Assessment and plan.  1.  History of atrial fibrillation on chronic Coumadin for anticoagulation-INR is in therapeutic range at 2.3 this is shown stability from last week's lab.  At this point will continue current dose of 5 mg Monday Wednesday Friday 2.5 mg all other days and recheck an INR in 1 week.  2.  History of atrial fibrillation as noted above this appears rate controlled on Lopressor INR is therapeutic on Coumadin.  3.  History of dementia with behaviors he has been started Depakote 125 mg in the morning and 250 mg at night this appears to be helping appears to be well-tolerated.  4.  History of end-stage renal disease continues on chronic hemodialysis 3 days a week and appears to be fairly well tolerated.  5.  History of hypertension continues on Lopressor he is on midodrine on dialysis days recent blood pressures 125/63-116/66-137/72.  6.  History of GERD he continues on Protonix he does not really have any complaints in that regard today.  7.  History of CHF-his fluid status is monitored by dialysis this appears stable clinically today-- most recent weight is 118.8 which appears to be stable over the past couple weeks  #8 diabetes type 2?  Hemoglobin A1c was 4.7 in September will update this  #9 history of peripheral vascular disease status post AKA's at this point continue supportive care he does have Vicodin for pain control which appears to be effective at this point  [phone]

## 2019-12-12 ENCOUNTER — Non-Acute Institutional Stay (SKILLED_NURSING_FACILITY): Payer: Medicare Other | Admitting: Internal Medicine

## 2019-12-12 ENCOUNTER — Encounter: Payer: Self-pay | Admitting: Internal Medicine

## 2019-12-12 DIAGNOSIS — I480 Paroxysmal atrial fibrillation: Secondary | ICD-10-CM

## 2019-12-12 DIAGNOSIS — Z7901 Long term (current) use of anticoagulants: Secondary | ICD-10-CM

## 2019-12-12 NOTE — Progress Notes (Signed)
Location:  [name] Room Number: 414-D Place of Service:  SNF (31)  Edward Duos, MD  Patient Care Team: Edward Duos, MD as PCP - General (Internal Medicine) [name], MD as Consulting Physician (Nephrology) Rehab, [place] ([place], [facility]  Extended Emergency Contact Information Primary Emergency Contact: [name],[name] Address: [place], [place] [address] [name] Phone: [phone] Work Phone: [phone] Mobile Phone: [phone] Relation: Daughter Secondary Emergency Contact: [name] Mobile Phone: [phone] Relation: Other    Allergies: Occlusive silicone sheets [silicone], Other, and Tape  Chief Complaint  Patient presents with  . Acute Visit    Patient is seen for COVID prophylaxis    HPI: Patient is 78 y.o. male who   Past Medical History:  Diagnosis Date  . A-fib ([place])   . Anemia   . Blood transfusion   . BPH (benign prostatic hyperplasia)   . CHF (congestive heart failure) ([place])   . Diarrhea   . DM (diabetes mellitus) ([place])   . ESRD on hemodialysis ([place])    Started dialysis in 2009  . History of GI bleed    secondary to coumadin  . HTN (hypertension)   . Hyperlipidemia   . OSA (obstructive sleep apnea)    uses CPAP  . Secondary hyperparathyroidism of renal origin [facility])     Past Surgical History:  Procedure Laterality Date  . ABDOMINAL AORTOGRAM N/A 11/15/2018   Procedure: ABDOMINAL AORTOGRAM;  Surgeon: [name], MD;  Location: [place] CV LAB;  Service: Cardiovascular;  Laterality: N/A;  . AMPUTATION Left 12/06/2018   Procedure: AMPUTATION DIGIT LEFT FIFTH TOE;  Surgeon: Angelia Mould, MD;  Location: [place];  Service: Vascular;  Laterality: Left;  . AMPUTATION Bilateral 12/29/2018   Procedure: AMPUTATION ABOVE KNEE;  Surgeon: Waynetta Sandy, MD;  Location: [place];   Service: Vascular;  Laterality: Bilateral;  . APPLICATION OF WOUND VAC Right 02/17/2019   Procedure: Application Of Wound Vac;  Surgeon: Marty Heck, MD;  Location: [place];  Service: Vascular;  Laterality: Right;  . BVT  [id number]   Left  Basilic Vein Transposition  . CHOLECYSTECTOMY    . EYE SURGERY     Catarct bil  . I & D EXTREMITY Right 02/17/2019   Procedure: Right above the kneee debridement;  Surgeon: Marty Heck, MD;  Location: [place];  Service: Vascular;  Laterality: Right;  . INSERTION OF DIALYSIS CATHETER  05/28/2012   Procedure: INSERTION OF DIALYSIS CATHETER;  Surgeon: Mal Misty, MD;  Location: [place];  Service: Vascular;  Laterality: Right;  . Left arm shuntogram.    . Left forearm loop graft with 6 mm Gore-Tex graft.    . LOWER EXTREMITY ANGIOGRAPHY Bilateral 11/15/2018   Procedure: LOWER EXTREMITY ANGIOGRAPHY;  Surgeon: [name], MD;  Location: [place] CV LAB;  Service: Cardiovascular;  Laterality: Bilateral;  . Pars plana vitrectomy with 25-gauge system    . PERIPHERAL VASCULAR ATHERECTOMY Left 11/15/2018   Procedure: PERIPHERAL VASCULAR ATHERECTOMY;  Surgeon: [name], MD;  Location: [place] CV LAB;  Service: Cardiovascular;  Laterality: Left;  SFA with STENT  . PERIPHERAL VASCULAR BALLOON ANGIOPLASTY Left 11/15/2018   Procedure: PERIPHERAL VASCULAR BALLOON ANGIOPLASTY;  Surgeon: [name], MD;  Location: [place] [facility];  Service: Cardiovascular;  Laterality: Left;  PT    Allergies  as of 12/12/2019      Reactions   Occlusive Silicone Sheets [silicone] Other (See Comments)   "Allergic," per [facility]   Other Other (See Comments)   Unknown reaction to Occlusive adhesive- "Allergic," per [facility]   Tape Itching, Other (See Comments)   Use Cloth tape only, please      Medication List       Accurate as of December 12, 2019 11:59 PM. If you have any questions, ask your nurse or doctor.        STOP taking these medications     calcium acetate (Phos Binder) 667 MG/5ML Soln Commonly known as: PHOSLYRA Stopped by: Inocencio Homes, MD     TAKE these medications   acetaminophen 500 MG tablet Commonly known as: TYLENOL Take 1,000 mg by mouth 2 (two) times daily.   ADULT NUTRITIONAL SUPPLEMENT PO Take 1 each by mouth daily. Magic Cup with dinner   NEPRO PO Take 1 Can by mouth daily.   ascorbic acid 500 MG tablet Commonly known as: VITAMIN C Take 500 mg by mouth daily.   atorvastatin 20 MG tablet Commonly known as: LIPITOR Take 20 mg by mouth daily.   dicyclomine 20 MG tablet Commonly known as: BENTYL Take 20 mg by mouth 3 (three) times daily before meals.   divalproex 125 MG capsule Commonly known as: DEPAKOTE SPRINKLE Take 125 mg by mouth every morning.   divalproex 125 MG capsule Commonly known as: DEPAKOTE SPRINKLE Take 250 mg by mouth at bedtime.   feeding supplement (PRO-STAT SUGAR FREE 64) Liqd Take 30 mLs by mouth 2 (two) times daily. 9a and 5p   Hectorol 4 MCG/2ML injection Generic drug: doxercalciferol Inject 2 mcg into the vein every Monday, Wednesday, and Friday with hemodialysis.   HYDROcodone-acetaminophen 5-325 MG tablet Commonly known as: Norco Take 1 tablet by mouth every 12 (twelve) hours.   loperamide 2 MG tablet Commonly known as: IMODIUM A-D Take 4 mg by mouth See admin instructions. Take 2 tablets ( 4 mg) by mouth after 1st loose stool as needed for diarrhea   metoprolol succinate 25 MG 24 hr tablet Commonly known as: TOPROL-XL Take 25 mg by mouth every Monday, Wednesday, and Friday. At bedtime   metoprolol tartrate 25 MG tablet Commonly known as: LOPRESSOR Take 25 mg by mouth See admin instructions. Take BID on Sun/Tues/Thurs/Sat   midodrine 10 MG tablet Commonly known as: PROAMATINE Take one tablet (10 mg) by mouth on Monday, Wednesday, Friday 30 minutes prior to dialysis.   multivitamin Tabs tablet Take 1 tablet by mouth at bedtime.   pantoprazole 40 MG  tablet Commonly known as: PROTONIX Take 1 tablet (40 mg total) by mouth daily.   sevelamer carbonate 800 MG tablet Commonly known as: RENVELA Take 1,600 mg by mouth 3 (three) times daily with meals.   Vitamin D3 1.25 MG (50000 UT) Caps Take 1 capsule by mouth once a week.   warfarin 2.5 MG tablet Commonly known as: COUMADIN Take 2.5 mg by mouth See admin instructions. Take on Sun-Tue-Thu-Sat   warfarin 5 MG tablet Commonly known as: COUMADIN Take 5 mg by mouth daily. Take on Mondays and Wednesdays and Fridays       No orders of the defined types were placed in this encounter.   Immunization History  Administered Date(s) Administered  . Influenza-Unspecified 08/29/2019  . Pneumococcal Polysaccharide-23 10/07/2019    Social History   Tobacco Use  . Smoking status: Former Smoker    Types: Cigarettes  Quit date: 06/30/2004    Years since quitting: 15.4  . Smokeless tobacco: Never Used  Substance Use Topics  . Alcohol use: No     Vitals:   12/12/19 1402  BP: (!) 93/53  Pulse: 80  Resp: 19  Temp: (!) 97.2 F (36.2 C)   Body mass index is 63.04 kg/m.   Patient Active Problem List   Diagnosis Date Noted  . Infection due to ESBL-producing Escherichia coli 08/12/2019  . MRSA cellulitis 08/12/2019  . Sacral decubitus ulcer, stage IV ([place]) 08/05/2019  . Infected decubitus ulcer, stage IV ([place]) 08/05/2019  . Hypertensive heart and kidney disease with chronic diastolic congestive heart failure and stage 5 chronic kidney disease on chronic dialysis ([place]) 03/24/2019  . End stage renal disease on dialysis due to type 2 diabetes mellitus ([place]) 03/24/2019  . 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 Anemia due to end stage renal disease ([place]) 03/24/2019  . Protein-calorie malnutrition, severe ([place]) 03/24/2019  . Hyperlipidemia associated with type 2 diabetes mellitus ([place]) 02/22/2019  . Amputation stump infection ([place]) 02/15/2019  .  Conjunctivitis of left eye 01/04/2019  . 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[place]) 12/30/2018  . Open wound of left foot   . Encounter for monitoring Coumadin therapy   . OSA (obstructive sleep apnea)   . ESRD on hemodialysis ([place])   . BPH (benign prostatic hyperplasia)   . Hypotension 12/07/2018  . GERD (gastroesophageal reflux disease) 12/07/2018  . Pressure injury of skin 12/03/2018  . Symptomatic anemia 12/02/2018  . Acute on chronic blood loss anemia 12/02/2018  . 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 Lower GI bleeding 12/02/2018  . Heme positive stool   . Acute on chronic diastolic (congestive) heart failure ([place]) 11/19/2018  . Acute CVA (cerebrovascular accident) ([place]) 11/19/2018  . Hypoglycemia 11/19/2018  . Peripheral vascular disease due to secondary diabetes ([place]) 11/19/2018  . Dry gangrene ([place]) 11/19/2018  . 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 DNR (do not resuscitate) discussion   . Goals of care, counseling/discussion   . Palliative care by specialist   . Acute hypoxemic respiratory failure ([place]) 11/06/2018  . Volume overload 11/06/2018  . Multifocal pneumonia 11/06/2018  . Acute metabolic encephalopathy [id number]  . Physical deconditioning 11/06/2018  . Acute respiratory failure with hypoxia and hypercapnia ([place]) 11/04/2018  . CAP (community acquired pneumonia) 11/04/2018  . Pleural effusion, right 11/04/2018  . Increased ammonia level 11/04/2018  . Abnormal CT of the abdomen 11/04/2018  . Atrial fibrillation with RVR ([place]) 11/04/2018  . Sepsis ([place]) 10/21/2018  . Acute encephalopathy 10/21/2018  . Elevated alkaline phosphatase level 10/21/2018  . Chronic anemia 10/21/2018  . Thrombocytopenia ([place]) 10/21/2018  . Pulmonary HTN ([place]) 03/04/2017  . Aortic valve stenosis 03/04/2017  . Hypertension, accelerated with heart disease, without CHF 01/12/2017  . Dyspnea on exertion 01/12/2017  . ESRD (end stage renal  disease) ([place]) 06/07/2012  . Hyperlipidemia 04/24/2011  . HYPERTENSION, BENIGN 10/24/2009  . Type 2 diabetes mellitus with hypertension and end stage renal disease on dialysis ([place]) 10/23/2009  . OBSTRUCTIVE SLEEP APNEA 10/23/2009  . PAF (paroxysmal atrial fibrillation) ([place]) 10/23/2009  . CHF (congestive heart failure) ([place]) 10/23/2009    CMP     Component Value Date/Time   NA 136 08/16/2019 1818   NA 143 06/06/2019 0000   K 4.0 08/16/2019 1818   CL 97 (L) 08/16/2019 1818   CO2 25 08/16/2019 1818   GLUCOSE  75 08/16/2019 1818   BUN 28 (H) 08/16/2019 1818   BUN 44 (A) 06/06/2019 0000   CREATININE 3.59 (H) 08/16/2019 1818   CALCIUM 9.5 08/16/2019 1818   CALCIUM 8.3 (L) 10/25/2008 1450   PROT 7.8 08/16/2019 1818   ALBUMIN 2.9 (L) 08/16/2019 1818   AST 54 (H) 08/16/2019 1818   ALT 38 08/16/2019 1818   ALKPHOS 229 (H) 08/16/2019 1818   BILITOT 1.2 08/16/2019 1818   GFRNONAA 15 (L) 08/16/2019 1818   GFRAA 18 (L) 08/16/2019 1818   Recent Labs    02/20/19 1300 02/27/19 0000 05/28/19 0253 05/29/19 0541 05/29/19 2316 05/30/19 0727 05/31/19 1319 06/06/19 0000 07/27/19 1529 08/16/19 1818  NA 137   < > 137  --   --  137 136 143 138 136  K 3.0*   < > 3.7  --   --  5.1 3.6 3.7 3.5 4.0  CL 101   < > 102  --   --  101 98  --  95* 97*  CO2 23   < > 21*  --   --  17* 24  --  26 25  GLUCOSE 101*   < > 151*  --   --  99 140*  --  143* 75  BUN 27*   < > 80*  --   --  158* 44* 44* 48* 28*  CREATININE 3.41*   < > 6.12*  --   --  8.32* 4.29* 3.5* 4.61* 3.59*  CALCIUM 7.7*   < > 9.3  --   --  10.0 9.1  --  10.2 9.5  MG  --    < > 2.0 2.1 2.4  --   --   --   --   --   PHOS 2.4*  --   --   --   --  2.6 3.0  --   --   --    < > = values in this interval not displayed.   Recent Labs    05/27/19 0348 05/28/19 0253 05/30/19 0727 05/31/19 1319 08/16/19 1818  AST 55* 31  --   --  54*  ALT 55* 42  --   --  38  ALKPHOS 272* 261*  --   --  229*  BILITOT 0.8 0.6  --   --  1.2  PROT 7.0  6.5  --   --  7.8  ALBUMIN 2.4* 2.2* 2.3* 2.3* 2.9*   Recent Labs    02/27/19 0000 05/31/19 1319 06/06/19 0000 07/27/19 1529 08/16/19 1818  WBC  --  10.3 11.5 18.6* 6.9  NEUTROABS  --   --  8 15.5* 4.4  HGB   < > 7.6* 9.3* 12.4* 11.0*  HCT   < > 24.7* 29* 40.2 34.8*  MCV  --  88.5  --  89.3 89.5  PLT   < > 220 251 171 193   < > = values in this interval not displayed.   No results for input(s): CHOL, LDLCALC, TRIG in the last 8760 hours.  Invalid input(s): HCL No results found for: [facility] Lab Results  Component Value Date   TSH 1.839 05/25/2019   Lab Results  Component Value Date   HGBA1C 4.7 09/08/2019   Lab Results  Component Value Date   CHOL 113 11/11/2018   HDL 39 (L) 11/11/2018   LDLCALC 62 11/11/2018   TRIG 60 11/11/2018   CHOLHDL 2.9 11/11/2018    Significant Diagnostic Results in last 30  days:  No results found.  Assessment and Plan  Atrial fibrillation/anticoagulated on Coumadin-patient's INR today is 2.9 on Coumadin 5 mg Monday Wednesday Friday and Coumadin 2.5 mg on all other days.  INR is therapeutic.  We will continue same regimen and recheck INR in 1 week     [name] , MD

## 2019-12-13 ENCOUNTER — Non-Acute Institutional Stay (SKILLED_NURSING_FACILITY): Payer: Medicare Other | Admitting: Internal Medicine

## 2019-12-13 DIAGNOSIS — Z789 Other specified health status: Secondary | ICD-10-CM | POA: Diagnosis not present

## 2019-12-14 LAB — VITAMIN D 25 HYDROXY (VIT D DEFICIENCY, FRACTURES): Vit D, 25-Hydroxy: 60

## 2019-12-16 ENCOUNTER — Encounter: Payer: Self-pay | Admitting: Internal Medicine

## 2019-12-16 NOTE — Progress Notes (Signed)
Location:   [facility] Service:   SNF  [name], Edward Delaine, MD  Patient Care Team: Hennie Duos, MD as PCP - General (Internal Medicine) [name], MD as Consulting Physician (Nephrology) Rehab, [place] ([place], [facility]  Extended Emergency Contact Information Primary Emergency Contact: [name],[name] Address: [place], [place] [address] [name] of [place] Phone: [phone] Work Phone: [phone] Mobile Phone: [phone] Relation: Daughter Secondary Emergency Contact: [name] Mobile Phone: [phone] Relation: Other    Allergies: Occlusive silicone sheets [silicone], Other, and Tape  Chief Complaint  Patient presents with  . Acute Visit    HPI: Patient is 78 y.o. male who is being seen for prophylaxis against COVID-19.  Everyone in the facility is being put on vitamin D, vitamin C and zinc.  Past Medical History:  Diagnosis Date  . A-fib ([place])   . Anemia   . Blood transfusion   . BPH (benign prostatic hyperplasia)   . CHF (congestive heart failure) ([place])   . Diarrhea   . DM (diabetes mellitus) ([place])   . ESRD on hemodialysis ([place])    Started dialysis in 2009  . History of GI bleed    secondary to coumadin  . HTN (hypertension)   . Hyperlipidemia   . OSA (obstructive sleep apnea)    uses CPAP  . Secondary hyperparathyroidism of renal origin [facility])     Past Surgical History:  Procedure Laterality Date  . ABDOMINAL AORTOGRAM N/A 11/15/2018   Procedure: ABDOMINAL AORTOGRAM;  Surgeon: [name], MD;  Location: [place] [facility];  Service: Cardiovascular;  Laterality: N/A;  . AMPUTATION Left 12/06/2018   Procedure: AMPUTATION DIGIT LEFT FIFTH TOE;  Surgeon: Angelia Mould, MD;  Location: [facility];  Service: Vascular;  Laterality: Left;  . AMPUTATION Bilateral 12/29/2018   Procedure: AMPUTATION ABOVE KNEE;  Surgeon: Waynetta Sandy, MD;  Location: [place];  Service: Vascular;  Laterality: Bilateral;  . APPLICATION OF WOUND VAC Right 02/17/2019   Procedure: Application Of Wound Vac;  Surgeon: Marty Heck, MD;  Location: [place];  Service: Vascular;  Laterality: Right;  . BVT  [id number]   Left  Basilic Vein Transposition  . CHOLECYSTECTOMY    . EYE SURGERY     Catarct bil  . I & D EXTREMITY Right 02/17/2019   Procedure: Right above the kneee debridement;  Surgeon: Marty Heck, MD;  Location: [place];  Service: Vascular;  Laterality: Right;  . INSERTION OF DIALYSIS CATHETER  05/28/2012   Procedure: INSERTION OF DIALYSIS CATHETER;  Surgeon: Mal Misty, MD;  Location: [facility];  Service: Vascular;  Laterality: Right;  . Left arm shuntogram.    . Left forearm loop graft with 6 mm Gore-Tex graft.    . LOWER EXTREMITY ANGIOGRAPHY Bilateral 11/15/2018   Procedure: LOWER EXTREMITY ANGIOGRAPHY;  Surgeon: [name], MD;  Location: [facility] [facility];  Service: Cardiovascular;  Laterality: Bilateral;  . Pars plana vitrectomy with 25-gauge system    . PERIPHERAL VASCULAR ATHERECTOMY Left 11/15/2018   Procedure: PERIPHERAL VASCULAR ATHERECTOMY;  Surgeon: [name], MD;  Location: [place] [facility];  Service: Cardiovascular;  Laterality: Left;  SFA with STENT  . PERIPHERAL VASCULAR BALLOON ANGIOPLASTY Left 11/15/2018   Procedure: PERIPHERAL VASCULAR BALLOON ANGIOPLASTY;  Surgeon: [name], MD;  Location: [place] [facility];  Service: Cardiovascular;  Laterality:  Left;  PT    Allergies as of 12/13/2019      Reactions   Occlusive Silicone Sheets [silicone] Other (See Comments)   "Allergic," per [facility]   Other Other (See Comments)   Unknown reaction to Occlusive adhesive- "Allergic," per [facility]   Tape Itching, Other (See Comments)   Use Cloth tape only, please      Medication List       Accurate as of December 13, 2019 11:59 PM. If you have any questions, ask your nurse or doctor.         acetaminophen 500 MG tablet Commonly known as: TYLENOL Take 1,000 mg by mouth 2 (two) times daily.   ADULT NUTRITIONAL SUPPLEMENT PO Take 1 each by mouth daily. Magic Cup with dinner   NEPRO PO Take 1 Can by mouth daily.   ascorbic acid 500 MG tablet Commonly known as: VITAMIN C Take 500 mg by mouth daily.   atorvastatin 20 MG tablet Commonly known as: LIPITOR Take 20 mg by mouth daily.   dicyclomine 20 MG tablet Commonly known as: BENTYL Take 20 mg by mouth 3 (three) times daily before meals.   divalproex 125 MG capsule Commonly known as: DEPAKOTE SPRINKLE Take 125 mg by mouth every morning.   divalproex 125 MG capsule Commonly known as: DEPAKOTE SPRINKLE Take 250 mg by mouth at bedtime.   feeding supplement (PRO-STAT SUGAR FREE 64) Liqd Take 30 mLs by mouth 2 (two) times daily. 9a and 5p   Hectorol 4 MCG/2ML injection Generic drug: doxercalciferol Inject 2 mcg into the vein every Monday, Wednesday, and Friday with hemodialysis.   HYDROcodone-acetaminophen 5-325 MG tablet Commonly known as: Norco Take 1 tablet by mouth every 12 (twelve) hours.   loperamide 2 MG tablet Commonly known as: IMODIUM A-D Take 4 mg by mouth See admin instructions. Take 2 tablets ( 4 mg) by mouth after 1st loose stool as needed for diarrhea   metoprolol succinate 25 MG 24 hr tablet Commonly known as: TOPROL-XL Take 25 mg by mouth every Monday, Wednesday, and Friday. At bedtime   metoprolol tartrate 25 MG tablet Commonly known as: LOPRESSOR Take 25 mg by mouth See admin instructions. Take BID on Sun/Tues/Thurs/Sat   midodrine 10 MG tablet Commonly known as: PROAMATINE Take one tablet (10 mg) by mouth on Monday, Wednesday, Friday 30 minutes prior to dialysis.   multivitamin Tabs tablet Take 1 tablet by mouth at bedtime.   pantoprazole 40 MG tablet Commonly known as: PROTONIX Take 1 tablet (40 mg total) by mouth daily.   sevelamer carbonate 800 MG tablet Commonly known as:  RENVELA Take 1,600 mg by mouth 3 (three) times daily with meals.   Vitamin D3 1.25 MG (50000 UT) Caps Take 1 capsule by mouth once a week.   warfarin 2.5 MG tablet Commonly known as: COUMADIN Take 2.5 mg by mouth See admin instructions. Take on Sun-Tue-Thu-Sat   warfarin 5 MG tablet Commonly known as: COUMADIN Take 5 mg by mouth daily. Take on Mondays and Wednesdays and Fridays       No orders of the defined types were placed in this encounter.   Immunization History  Administered Date(s) Administered  . Influenza-Unspecified 08/29/2019  . Pneumococcal Polysaccharide-23 10/07/2019    Social History   Tobacco Use  . Smoking status: Former Smoker    Types: Cigarettes    Quit date: 06/30/2004    Years since quitting: 15.4  . Smokeless tobacco: Never Used  Substance Use Topics  . Alcohol use:  No    Review of Systems   GENERAL:  no fevers, fatigue, appetite changes SKIN: No itching, rash HEENT: No complaint RESPIRATORY: No cough, wheezing, SOB CARDIAC: No chest pain, palpitations, lower extremity edema  GI: No abdominal pain, No N/V/D or constipation, No heartburn or reflux  GU: No dysuria, frequency or urgency, or incontinence  MUSCULOSKELETAL: No unrelieved bone/joint pain NEUROLOGIC: No headache, dizziness  PSYCHIATRIC: No overt anxiety or sadness  Vitals:   12/17/19 1820  BP: 118/64  Pulse: 96  Resp: 18  Temp: 98.5 F (36.9 C)   There is no height or weight on file to calculate BMI. Physical Exam  GENERAL APPEARANCE: Alert, conversant, No acute distress  SKIN: No diaphoresis rash HEENT: Unremarkable RESPIRATORY: Breathing is even, unlabored. Lung sounds are clear   CARDIOVASCULAR: Heart RRR no murmurs, rubs or gallops. No peripheral edema  GASTROINTESTINAL: Abdomen is soft, non-tender, not distended w/ normal bowel sounds.  GENITOURINARY: Bladder non tender, not distended  MUSCULOSKELETAL: Bilateral AKA NEUROLOGIC: Cranial nerves 2-12 grossly  intact. Moves all extremities PSYCHIATRIC: Mood and affect appropriate with some dementia, no behavioral issues  Patient Active Problem List   Diagnosis Date Noted  . Infection due to ESBL-producing Escherichia coli 08/12/2019  . MRSA cellulitis 08/12/2019  . Sacral decubitus ulcer, stage IV ([place]) 08/05/2019  . Infected decubitus ulcer, stage IV ([place]) 08/05/2019  . 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 End stage renal disease on dialysis due to type 2 diabetes mellitus ([place]) 03/24/2019  . 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 Anemia due to end stage renal disease ([place]) 03/24/2019  . Protein-calorie malnutrition, severe ([place]) 03/24/2019  . Hyperlipidemia associated with type 2 diabetes mellitus ([place]) 02/22/2019  . Amputation stump infection ([place]) 02/15/2019  . Conjunctivitis of left eye 01/04/2019  . 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[place]) 12/30/2018  . Open wound of left foot   . Encounter for monitoring Coumadin therapy   . OSA (obstructive sleep apnea)   . ESRD on hemodialysis ([place])   . BPH (benign prostatic hyperplasia)   . Hypotension 12/07/2018  . GERD (gastroesophageal reflux disease) 12/07/2018  . Pressure injury of skin 12/03/2018  . Symptomatic anemia 12/02/2018  . Acute on chronic blood loss anemia 12/02/2018  . 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 Lower GI bleeding 12/02/2018  . Heme positive stool   . Acute on chronic diastolic (congestive) heart failure ([place]) 11/19/2018  . Acute CVA (cerebrovascular accident) ([place]) 11/19/2018  . Hypoglycemia 11/19/2018  . Peripheral vascular disease due to secondary diabetes ([place]) 11/19/2018  . Dry gangrene ([place]) 11/19/2018  . 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 DNR (do not resuscitate) discussion   . Goals of care, counseling/discussion    . Palliative care by specialist   . Acute hypoxemic respiratory failure ([place]) 11/06/2018  . Volume overload 11/06/2018  . Multifocal pneumonia 11/06/2018  . Acute metabolic encephalopathy [id number]  . Physical deconditioning 11/06/2018  . Acute respiratory failure with hypoxia and hypercapnia ([place]) 11/04/2018  . CAP (community acquired pneumonia) 11/04/2018  . Pleural effusion, right 11/04/2018  . Increased ammonia level 11/04/2018  . Abnormal CT of the abdomen 11/04/2018  . Atrial fibrillation with RVR ([place]) 11/04/2018  . Sepsis ([place]) 10/21/2018  . Acute encephalopathy 10/21/2018  . Elevated alkaline phosphatase level 10/21/2018  . Chronic anemia 10/21/2018  . Thrombocytopenia ([place])  10/21/2018  . Pulmonary HTN ([place]) 03/04/2017  . Aortic valve stenosis 03/04/2017  . Hypertension, accelerated with heart disease, without CHF 01/12/2017  . Dyspnea on exertion 01/12/2017  . ESRD (end stage renal disease) ([place]) 06/07/2012  . Hyperlipidemia 04/24/2011  . HYPERTENSION, BENIGN 10/24/2009  . Type 2 diabetes mellitus with hypertension and end stage renal disease on dialysis ([place]) 10/23/2009  . OBSTRUCTIVE SLEEP APNEA 10/23/2009  . PAF (paroxysmal atrial fibrillation) ([place]) 10/23/2009  . CHF (congestive heart failure) ([place]) 10/23/2009    CMP     Component Value Date/Time   NA 136 08/16/2019 1818   NA 143 06/06/2019 0000   K 4.0 08/16/2019 1818   CL 97 (L) 08/16/2019 1818   CO2 25 08/16/2019 1818   GLUCOSE 75 08/16/2019 1818   BUN 28 (H) 08/16/2019 1818   BUN 44 (A) 06/06/2019 0000   CREATININE 3.59 (H) 08/16/2019 1818   CALCIUM 9.5 08/16/2019 1818   CALCIUM 8.3 (L) 10/25/2008 1450   PROT 7.8 08/16/2019 1818   ALBUMIN 2.9 (L) 08/16/2019 1818   AST 54 (H) 08/16/2019 1818   ALT 38 08/16/2019 1818   ALKPHOS 229 (H) 08/16/2019 1818   BILITOT 1.2 08/16/2019 1818   GFRNONAA 15 (L) 08/16/2019 1818   GFRAA 18 (L) 08/16/2019 1818   Recent Labs    02/20/19 1300 02/27/19 0000  05/28/19 0253 05/29/19 0541 05/29/19 2316 05/30/19 0727 05/31/19 1319 06/06/19 0000 07/27/19 1529 08/16/19 1818  NA 137   < > 137  --   --  137 136 143 138 136  K 3.0*   < > 3.7  --   --  5.1 3.6 3.7 3.5 4.0  CL 101   < > 102  --   --  101 98  --  95* 97*  CO2 23   < > 21*  --   --  17* 24  --  26 25  GLUCOSE 101*   < > 151*  --   --  99 140*  --  143* 75  BUN 27*   < > 80*  --   --  158* 44* 44* 48* 28*  CREATININE 3.41*   < > 6.12*  --   --  8.32* 4.29* 3.5* 4.61* 3.59*  CALCIUM 7.7*   < > 9.3  --   --  10.0 9.1  --  10.2 9.5  MG  --    < > 2.0 2.1 2.4  --   --   --   --   --   PHOS 2.4*  --   --   --   --  2.6 3.0  --   --   --    < > = values in this interval not displayed.   Recent Labs    05/27/19 0348 05/28/19 0253 05/30/19 0727 05/31/19 1319 08/16/19 1818  AST 55* 31  --   --  54*  ALT 55* 42  --   --  38  ALKPHOS 272* 261*  --   --  229*  BILITOT 0.8 0.6  --   --  1.2  PROT 7.0 6.5  --   --  7.8  ALBUMIN 2.4* 2.2* 2.3* 2.3* 2.9*   Recent Labs    02/27/19 0000 05/31/19 1319 06/06/19 0000 07/27/19 1529 08/16/19 1818  WBC  --  10.3 11.5 18.6* 6.9  NEUTROABS  --   --  8 15.5* 4.4  HGB   < > 7.6* 9.3* 12.4* 11.0*  HCT   < > 24.7* 29* 40.2 34.8*  MCV  --  88.5  --  89.3 89.5  PLT   < > 220 251 171 193   < > = values in this interval not displayed.   No results for input(s): CHOL, LDLCALC, TRIG in the last 8760 hours.  Invalid input(s): HCL No results found for: [facility] Lab Results  Component Value Date   TSH 1.839 05/25/2019   Lab Results  Component Value Date   HGBA1C 4.7 09/08/2019   Lab Results  Component Value Date   CHOL 113 11/11/2018   HDL 39 (L) 11/11/2018   LDLCALC 62 11/11/2018   TRIG 60 11/11/2018   CHOLHDL 2.9 11/11/2018    Significant Diagnostic Results in last 30 days:  No results found.  Assessment and Plan  Does not have immunity to COVID-19-patient is already on vitamin D 50,000 units weekly which will continue for 90  days.  Patient is being started on vitamin C 500 mg daily for 90 days and zinc sulfate 220 mg q. OD for 90 days.  Patient will have a vitamin D level drawn today and again in 90 days.     Hennie Duos, MD

## 2019-12-17 ENCOUNTER — Encounter: Payer: Self-pay | Admitting: Internal Medicine

## 2019-12-19 ENCOUNTER — Non-Acute Institutional Stay (SKILLED_NURSING_FACILITY): Payer: Medicare Other | Admitting: Internal Medicine

## 2019-12-19 DIAGNOSIS — Z7901 Long term (current) use of anticoagulants: Secondary | ICD-10-CM | POA: Diagnosis not present

## 2019-12-19 DIAGNOSIS — I480 Paroxysmal atrial fibrillation: Secondary | ICD-10-CM | POA: Diagnosis not present

## 2019-12-24 ENCOUNTER — Encounter: Payer: Self-pay | Admitting: Internal Medicine

## 2019-12-24 DIAGNOSIS — Z7901 Long term (current) use of anticoagulants: Secondary | ICD-10-CM | POA: Insufficient documentation

## 2019-12-24 NOTE — Progress Notes (Signed)
Location:      Place of Service:     Hennie Duos, MD  Patient Care Team: Hennie Duos, MD as PCP - General (Internal Medicine) [name], MD as Consulting Physician (Nephrology) Rehab, [place] ([place], [facility]  Extended Emergency Contact Information Primary Emergency Contact: [name],[name] Address: [place], [place] [address] [name] of [place] Phone: [phone] Work Phone: [phone] Mobile Phone: [phone] Relation: Daughter Secondary Emergency Contact: [name] Mobile Phone: [phone] Relation: Other    Allergies: Occlusive silicone sheets [silicone], Other, and Tape  No chief complaint on file.   HPI: Patient is 78 y.o. male who   Past Medical History:  Diagnosis Date  . A-fib ([place])   . Anemia   . Blood transfusion   . BPH (benign prostatic hyperplasia)   . CHF (congestive heart failure) ([place])   . Diarrhea   . DM (diabetes mellitus) ([place])   . ESRD on hemodialysis ([place])    Started dialysis in 2009  . History of GI bleed    secondary to coumadin  . HTN (hypertension)   . Hyperlipidemia   . OSA (obstructive sleep apnea)    uses CPAP  . Secondary hyperparathyroidism of renal origin [facility])     Past Surgical History:  Procedure Laterality Date  . ABDOMINAL AORTOGRAM N/A 11/15/2018   Procedure: ABDOMINAL AORTOGRAM;  Surgeon: [name], MD;  Location: [place] [facility];  Service: Cardiovascular;  Laterality: N/A;  . AMPUTATION Left 12/06/2018   Procedure: AMPUTATION DIGIT LEFT FIFTH TOE;  Surgeon: Angelia Mould, MD;  Location: [place];  Service: Vascular;  Laterality: Left;  . AMPUTATION Bilateral 12/29/2018   Procedure: AMPUTATION ABOVE KNEE;  Surgeon: Waynetta Sandy, MD;  Location: [place];  Service: Vascular;  Laterality: Bilateral;  . APPLICATION OF WOUND VAC Right 02/17/2019   Procedure: Application Of Wound Vac;  Surgeon:  Marty Heck, MD;  Location: [place];  Service: Vascular;  Laterality: Right;  . BVT  123456   Left  Basilic Vein Transposition  . CHOLECYSTECTOMY    . EYE SURGERY     Catarct bil  . I & D EXTREMITY Right 02/17/2019   Procedure: Right above the kneee debridement;  Surgeon: Marty Heck, MD;  Location: [place];  Service: Vascular;  Laterality: Right;  . INSERTION OF DIALYSIS CATHETER  05/28/2012   Procedure: INSERTION OF DIALYSIS CATHETER;  Surgeon: Mal Misty, MD;  Location: [place];  Service: Vascular;  Laterality: Right;  . Left arm shuntogram.    . Left forearm loop graft with 6 mm Gore-Tex graft.    . LOWER EXTREMITY ANGIOGRAPHY Bilateral 11/15/2018   Procedure: LOWER EXTREMITY ANGIOGRAPHY;  Surgeon: [name], MD;  Location: [place] [facility];  Service: Cardiovascular;  Laterality: Bilateral;  . Pars plana vitrectomy with 25-gauge system    . PERIPHERAL VASCULAR ATHERECTOMY Left 11/15/2018   Procedure: PERIPHERAL VASCULAR ATHERECTOMY;  Surgeon: [name], MD;  Location: [place] [facility];  Service: Cardiovascular;  Laterality: Left;  SFA with STENT  . PERIPHERAL VASCULAR BALLOON ANGIOPLASTY Left 11/15/2018   Procedure: PERIPHERAL VASCULAR BALLOON ANGIOPLASTY;  Surgeon: [name], MD;  Location: [place] [facility];  Service: Cardiovascular;  Laterality: Left;  PT    Allergies as of 12/19/2019      Reactions   Occlusive Silicone Sheets [silicone] Other (See Comments)   "Allergic,"  per [facility]   Other Other (See Comments)   Unknown reaction to Occlusive adhesive- "Allergic," per [facility]   Tape Itching, Other (See Comments)   Use Cloth tape only, please      Medication List       Accurate as of December 19, 2019 11:59 PM. If you have any questions, ask your nurse or doctor.        acetaminophen 500 MG tablet Commonly known as: TYLENOL Take 1,000 mg by mouth 2 (two) times daily.   ADULT NUTRITIONAL SUPPLEMENT PO Take 1 each by mouth daily. Magic  Cup with dinner   NEPRO PO Take 1 Can by mouth daily.   ascorbic acid 500 MG tablet Commonly known as: VITAMIN C Take 500 mg by mouth daily.   atorvastatin 20 MG tablet Commonly known as: LIPITOR Take 20 mg by mouth daily.   dicyclomine 20 MG tablet Commonly known as: BENTYL Take 20 mg by mouth 3 (three) times daily before meals.   divalproex 125 MG capsule Commonly known as: DEPAKOTE SPRINKLE Take 125 mg by mouth every morning.   divalproex 125 MG capsule Commonly known as: DEPAKOTE SPRINKLE Take 250 mg by mouth at bedtime.   feeding supplement (PRO-STAT SUGAR FREE 64) Liqd Take 30 mLs by mouth 2 (two) times daily. 9a and 5p   Hectorol 4 MCG/2ML injection Generic drug: doxercalciferol Inject 2 mcg into the vein every Monday, Wednesday, and Friday with hemodialysis.   HYDROcodone-acetaminophen 5-325 MG tablet Commonly known as: Norco Take 1 tablet by mouth every 12 (twelve) hours.   loperamide 2 MG tablet Commonly known as: IMODIUM A-D Take 4 mg by mouth See admin instructions. Take 2 tablets ( 4 mg) by mouth after 1st loose stool as needed for diarrhea   metoprolol succinate 25 MG 24 hr tablet Commonly known as: TOPROL-XL Take 25 mg by mouth every Monday, Wednesday, and Friday. At bedtime   metoprolol tartrate 25 MG tablet Commonly known as: LOPRESSOR Take 25 mg by mouth See admin instructions. Take BID on Sun/Tues/Thurs/Sat   midodrine 10 MG tablet Commonly known as: PROAMATINE Take one tablet (10 mg) by mouth on Monday, Wednesday, Friday 30 minutes prior to dialysis.   multivitamin Tabs tablet Take 1 tablet by mouth at bedtime.   pantoprazole 40 MG tablet Commonly known as: PROTONIX Take 1 tablet (40 mg total) by mouth daily.   sevelamer carbonate 800 MG tablet Commonly known as: RENVELA Take 1,600 mg by mouth 3 (three) times daily with meals.   Vitamin D3 1.25 MG (50000 UT) Caps Take 1 capsule by mouth once a week.   warfarin 2.5 MG  tablet Commonly known as: COUMADIN Take 2.5 mg by mouth See admin instructions. Take on Sun-Tue-Thu-Sat   warfarin 5 MG tablet Commonly known as: COUMADIN Take 5 mg by mouth daily. Take on Mondays and Wednesdays and Fridays       No orders of the defined types were placed in this encounter.   Immunization History  Administered Date(s) Administered  . Influenza-Unspecified 08/29/2019  . Pneumococcal Polysaccharide-23 10/07/2019    Social History   Tobacco Use  . Smoking status: Former Smoker    Types: Cigarettes    Quit date: 06/30/2004    Years since quitting: 15.4  . Smokeless tobacco: Never Used  Substance Use Topics  . Alcohol use: No     There were no vitals filed for this visit. There is no height or weight on file to calculate BMI.   Patient Active  Problem List   Diagnosis Date Noted  . Anticoagulated on Coumadin 12/24/2019  . Infection due to ESBL-producing Escherichia coli 08/12/2019  . MRSA cellulitis 08/12/2019  . Sacral decubitus ulcer, stage IV ([place]) 08/05/2019  . Infected decubitus ulcer, stage IV ([place]) 08/05/2019  . Hypertensive heart and kidney disease with chronic diastolic congestive heart failure and stage 5 chronic kidney disease on chronic dialysis ([place]) 03/24/2019  . 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 Anemia due to end stage renal disease ([place]) 03/24/2019  . Protein-calorie malnutrition, severe ([place]) 03/24/2019  . Hyperlipidemia associated with type 2 diabetes mellitus ([place]) 02/22/2019  . Amputation stump infection ([place]) 02/15/2019  . Conjunctivitis of left eye 01/04/2019  . 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[place]) 12/30/2018  . Open wound of left foot   . Encounter for monitoring Coumadin therapy   . OSA (obstructive sleep apnea)   . ESRD on hemodialysis ([place])   . BPH (benign prostatic hyperplasia)   . Hypotension  12/07/2018  . GERD (gastroesophageal reflux disease) 12/07/2018  . Pressure injury of skin 12/03/2018  . Symptomatic anemia 12/02/2018  . Acute on chronic blood loss anemia 12/02/2018  . 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[place]) 12/02/2018  . Lower GI bleeding 12/02/2018  . Heme positive stool   . Acute on chronic diastolic (congestive) heart failure ([place]) 11/19/2018  . Acute CVA (cerebrovascular accident) ([place]) 11/19/2018  . Hypoglycemia 11/19/2018  . Peripheral vascular disease due to secondary diabetes ([place]) 11/19/2018  . Dry gangrene ([place]) 11/19/2018  . 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 DNR (do not resuscitate) discussion   . Goals of care, counseling/discussion   . Palliative care by specialist   . Acute hypoxemic respiratory failure ([place]) 11/06/2018  . Volume overload 11/06/2018  . Multifocal pneumonia 11/06/2018  . Acute metabolic encephalopathy [id number]  . Physical deconditioning 11/06/2018  . Acute respiratory failure with hypoxia and hypercapnia ([place]) 11/04/2018  . CAP (community acquired pneumonia) 11/04/2018  . Pleural effusion, right 11/04/2018  . Increased ammonia level 11/04/2018  . Abnormal CT of the abdomen 11/04/2018  . Atrial fibrillation with RVR ([place]) 11/04/2018  . Sepsis ([place]) 10/21/2018  . Acute encephalopathy 10/21/2018  . Elevated alkaline phosphatase level 10/21/2018  . Chronic anemia 10/21/2018  . Thrombocytopenia ([place]) 10/21/2018  . Pulmonary HTN ([place]) 03/04/2017  . Aortic valve stenosis 03/04/2017  . Hypertension, accelerated with heart disease, without CHF 01/12/2017  . Dyspnea on exertion 01/12/2017  . ESRD (end stage renal disease) ([place]) 06/07/2012  . Hyperlipidemia 04/24/2011  . HYPERTENSION, BENIGN 10/24/2009  . Type 2 diabetes mellitus with hypertension and end stage renal disease on dialysis ([place]) 10/23/2009  . OBSTRUCTIVE SLEEP APNEA 10/23/2009  . PAF (paroxysmal atrial fibrillation) ([place]) 10/23/2009  . CHF  (congestive heart failure) ([place]) 10/23/2009    CMP     Component Value Date/Time   NA 136 08/16/2019 1818   NA 143 06/06/2019 0000   K 4.0 08/16/2019 1818   CL 97 (L) 08/16/2019 1818   CO2 25 08/16/2019 1818   GLUCOSE 75 08/16/2019 1818   BUN 28 (H) 08/16/2019 1818   BUN 44 (A) 06/06/2019 0000   CREATININE 3.59 (H) 08/16/2019 1818   CALCIUM 9.5 08/16/2019 1818   CALCIUM 8.3 (L) 10/25/2008 1450   PROT 7.8 08/16/2019 1818   ALBUMIN 2.9 (L) 08/16/2019 1818   AST 54 (H) 08/16/2019 1818  ALT 38 08/16/2019 1818   ALKPHOS 229 (H) 08/16/2019 1818   BILITOT 1.2 08/16/2019 1818   GFRNONAA 15 (L) 08/16/2019 1818   GFRAA 18 (L) 08/16/2019 1818   Recent Labs    02/20/19 1300 02/27/19 0000 05/28/19 0253 05/29/19 0541 05/29/19 2316 05/30/19 0727 05/31/19 1319 06/06/19 0000 07/27/19 1529 08/16/19 1818  NA 137   < > 137  --   --  137 136 143 138 136  K 3.0*   < > 3.7  --   --  5.1 3.6 3.7 3.5 4.0  CL 101   < > 102  --   --  101 98  --  95* 97*  CO2 23   < > 21*  --   --  17* 24  --  26 25  GLUCOSE 101*   < > 151*  --   --  99 140*  --  143* 75  BUN 27*   < > 80*  --   --  158* 44* 44* 48* 28*  CREATININE 3.41*   < > 6.12*  --   --  8.32* 4.29* 3.5* 4.61* 3.59*  CALCIUM 7.7*   < > 9.3  --   --  10.0 9.1  --  10.2 9.5  MG  --    < > 2.0 2.1 2.4  --   --   --   --   --   PHOS 2.4*  --   --   --   --  2.6 3.0  --   --   --    < > = values in this interval not displayed.   Recent Labs    05/27/19 0348 05/28/19 0253 05/30/19 0727 05/31/19 1319 08/16/19 1818  AST 55* 31  --   --  54*  ALT 55* 42  --   --  38  ALKPHOS 272* 261*  --   --  229*  BILITOT 0.8 0.6  --   --  1.2  PROT 7.0 6.5  --   --  7.8  ALBUMIN 2.4* 2.2* 2.3* 2.3* 2.9*   Recent Labs    02/27/19 0000 05/31/19 1319 06/06/19 0000 07/27/19 1529 08/16/19 1818  WBC  --  10.3 11.5 18.6* 6.9  NEUTROABS  --   --  8 15.5* 4.4  HGB   < > 7.6* 9.3* 12.4* 11.0*  HCT   < > 24.7* 29* 40.2 34.8*  MCV  --  88.5  --   89.3 89.5  PLT   < > 220 251 171 193   < > = values in this interval not displayed.   No results for input(s): CHOL, LDLCALC, TRIG in the last 8760 hours.  Invalid input(s): HCL No results found for: [facility] Lab Results  Component Value Date   TSH 1.839 05/25/2019   Lab Results  Component Value Date   HGBA1C 4.7 09/08/2019   Lab Results  Component Value Date   CHOL 113 11/11/2018   HDL 39 (L) 11/11/2018   LDLCALC 62 11/11/2018   TRIG 60 11/11/2018   CHOLHDL 2.9 11/11/2018    Significant Diagnostic Results in last 30 days:  No results found.  Assessment and Plan  Paroxysmal atrial fibrillation/anticoagulated on Coumadin-patient's INR is 8.0, which is surprising and unbelievable considering his regimen has not been changed in any large way, patient was on Coumadin 5 mg Monday Wednesday Friday and 2.5 mg on Tuesday Thursday Saturday and Sunday. Coumadin will be held until INR is  less than or equal to 2.6 then restart Coumadin at 2.5 mg daily with INR in 1 week.     Hennie Duos , MD

## 2019-12-26 ENCOUNTER — Other Ambulatory Visit: Payer: Self-pay | Admitting: Internal Medicine

## 2019-12-26 MED ORDER — HYDROCODONE-ACETAMINOPHEN 5-325 MG PO TABS: 1.0000 | ORAL_TABLET | Freq: Two times a day (BID) | ORAL | 0 refills | Status: DC

## 2019-12-27 ENCOUNTER — Other Ambulatory Visit: Payer: Self-pay

## 2019-12-27 ENCOUNTER — Ambulatory Visit (INDEPENDENT_AMBULATORY_CARE_PROVIDER_SITE_OTHER): Payer: Medicare Other | Admitting: Vascular Surgery

## 2019-12-27 ENCOUNTER — Encounter: Payer: Self-pay | Admitting: Vascular Surgery

## 2019-12-27 VITALS — BP 83/49 | HR 85 | Temp 98.2°F | Resp 16

## 2019-12-27 DIAGNOSIS — I779 Disorder of arteries and arterioles, unspecified: Secondary | ICD-10-CM | POA: Diagnosis not present

## 2019-12-27 DIAGNOSIS — N186 End stage renal disease: Secondary | ICD-10-CM | POA: Diagnosis not present

## 2019-12-27 DIAGNOSIS — Z992 Dependence on renal dialysis: Secondary | ICD-10-CM

## 2019-12-27 NOTE — Progress Notes (Signed)
Referring Physician: Juanell Fairly, NP  Patient name: [name] MRN: [id number] DOB: 1941/02/25 Sex: male  REASON FOR CONSULT: Ulceration left arm AV fistula  HPI: [name] is a 78 y.o. male, who was noted to have an ulceration on his left arm AV fistula recently.  Patient dialyzes on Monday Wednesday Friday.  He is on warfarin for atrial fibrillation.  He is not really a very good historian and does not really recall if he has had any bleeding episodes from his AV fistula and does not really pay much attention to it.  Currently resides in a skilled nursing facility.  He has bilateral above-knee amputations.  Other medical problems include diabetes, hypertension, hyperlipidemia, sleep apnea all of which have been stable.  Past Medical History:  Diagnosis Date  . A-fib ([place])   . Anemia   . Blood transfusion   . BPH (benign prostatic hyperplasia)   . CHF (congestive heart failure) ([place])   . Diarrhea   . DM (diabetes mellitus) ([place])   . ESRD on hemodialysis ([place])    Started dialysis in 2009  . History of GI bleed    secondary to coumadin  . HTN (hypertension)   . Hyperlipidemia   . OSA (obstructive sleep apnea)    uses CPAP  . Secondary hyperparathyroidism of renal origin [facility])    Past Surgical History:  Procedure Laterality Date  . ABDOMINAL AORTOGRAM N/A 11/15/2018   Procedure: ABDOMINAL AORTOGRAM;  Surgeon: [name], MD;  Location: [place] [facility];  Service: Cardiovascular;  Laterality: N/A;  . AMPUTATION Left 12/06/2018   Procedure: AMPUTATION DIGIT LEFT FIFTH TOE;  Surgeon: Angelia Mould, MD;  Location: [place];  Service: Vascular;  Laterality: Left;  . AMPUTATION Bilateral 12/29/2018   Procedure: AMPUTATION ABOVE KNEE;  Surgeon: Waynetta Sandy, MD;  Location: [place];  Service: Vascular;  Laterality: Bilateral;  . APPLICATION OF WOUND VAC Right 02/17/2019   Procedure: Application Of Wound Vac;  Surgeon: Marty Heck, MD;  Location:  [place];  Service: Vascular;  Laterality: Right;  . BVT  123456   Left  Basilic Vein Transposition  . CHOLECYSTECTOMY    . EYE SURGERY     Catarct bil  . I & D EXTREMITY Right 02/17/2019   Procedure: Right above the kneee debridement;  Surgeon: Marty Heck, MD;  Location: [place];  Service: Vascular;  Laterality: Right;  . INSERTION OF DIALYSIS CATHETER  05/28/2012   Procedure: INSERTION OF DIALYSIS CATHETER;  Surgeon: Mal Misty, MD;  Location: [place];  Service: Vascular;  Laterality: Right;  . Left arm shuntogram.    . Left forearm loop graft with 6 mm Gore-Tex graft.    . LOWER EXTREMITY ANGIOGRAPHY Bilateral 11/15/2018   Procedure: LOWER EXTREMITY ANGIOGRAPHY;  Surgeon: [name], MD;  Location: [place] [facility];  Service: Cardiovascular;  Laterality: Bilateral;  . Pars plana vitrectomy with 25-gauge system    . PERIPHERAL VASCULAR ATHERECTOMY Left 11/15/2018   Procedure: PERIPHERAL VASCULAR ATHERECTOMY;  Surgeon: [name], MD;  Location: [place] [facility];  Service: Cardiovascular;  Laterality: Left;  SFA with STENT  . PERIPHERAL VASCULAR BALLOON ANGIOPLASTY Left 11/15/2018   Procedure: PERIPHERAL VASCULAR BALLOON ANGIOPLASTY;  Surgeon: [name], MD;  Location: [place] [facility];  Service: Cardiovascular;  Laterality: Left;  PT    Family History  Problem Relation Age of Onset  . Alzheimer's disease Mother   . Diabetes Father  Amputation:  bilateral legs  . Cancer Daughter        breast cancer  . Diabetes Son   . Heart disease Son        before age 63  . Hypertension Son   . Anesthesia problems Neg Hx     SOCIAL HISTORY: Social History   Socioeconomic History  . Marital status: Divorced    Spouse name: Not on file  . Number of children: Not on file  . Years of education: Not on file  . Highest education level: Not on file  Occupational History  . Not on file  Tobacco Use  . Smoking status: Former Smoker    Types: Cigarettes     Quit date: 06/30/2004    Years since quitting: 15.5  . Smokeless tobacco: Never Used  Substance and Sexual Activity  . Alcohol use: No  . Drug use: No  . Sexual activity: Never  Other Topics Concern  . Not on file  Social History Narrative     The patient is a former smoker, quit is 2005.  Does not     drink or abuse drugs.          Social Determinants of Health   Financial Resource Strain:   . Difficulty of Paying Living Expenses: Not on file  Food Insecurity:   . Worried About Charity fundraiser in the Last Year: Not on file  . Ran Out of Food in the Last Year: Not on file  Transportation Needs:   . Lack of Transportation (Medical): Not on file  . Lack of Transportation (Non-Medical): Not on file  Physical Activity:   . Days of Exercise per Week: Not on file  . Minutes of Exercise per Session: Not on file  Stress:   . Feeling of Stress : Not on file  Social Connections:   . Frequency of Communication with Friends and Family: Not on file  . Frequency of Social Gatherings with Friends and Family: Not on file  . Attends Religious Services: Not on file  . Active Member of Clubs or Organizations: Not on file  . Attends Archivist Meetings: Not on file  . Marital Status: Not on file  Intimate Partner Violence:   . Fear of Current or Ex-Partner: Not on file  . Emotionally Abused: Not on file  . Physically Abused: Not on file  . Sexually Abused: Not on file    Allergies  Allergen Reactions  . Occlusive Silicone Sheets [Silicone] Other (See Comments)    "Allergic," per MAR  . Other Other (See Comments)    Unknown reaction to Occlusive adhesive- "Allergic," per MAR  . Sulfa Antibiotics   . Tape Itching and Other (See Comments)    Use Cloth tape only, please    Current Outpatient Medications  Medication Sig Dispense Refill  . acetaminophen (TYLENOL) 500 MG tablet Take 1,000 mg by mouth 2 (two) times daily.     . Amino Acids-Protein Hydrolys (FEEDING  SUPPLEMENT, PRO-STAT SUGAR FREE 64,) LIQD Take 30 mLs by mouth 2 (two) times daily. 9a and 5p    . ascorbic acid (VITAMIN C) 500 MG tablet Take 500 mg by mouth daily.    Marland Kitchen atorvastatin (LIPITOR) 20 MG tablet Take 20 mg by mouth daily.    . Cholecalciferol (VITAMIN D3) 1.25 MG (50000 UT) CAPS Take 1 capsule by mouth once a week.    . dicyclomine (BENTYL) 20 MG tablet Take 20 mg by mouth 3 (three) times  daily before meals.    . divalproex (DEPAKOTE SPRINKLE) 125 MG capsule Take 125 mg by mouth every morning.    . divalproex (DEPAKOTE SPRINKLE) 125 MG capsule Take 250 mg by mouth at bedtime.    Marland Kitchen doxercalciferol (HECTOROL) 4 MCG/2ML injection Inject 2 mcg into the vein every Monday, Wednesday, and Friday with hemodialysis.    Marland Kitchen HYDROcodone-acetaminophen (NORCO) 5-325 MG tablet Take 1 tablet by mouth every 12 (twelve) hours. 180 tablet 0  . loperamide (IMODIUM A-D) 2 MG tablet Take 4 mg by mouth See admin instructions. Take 2 tablets ( 4 mg) by mouth after 1st loose stool as needed for diarrhea    . metoprolol succinate (TOPROL-XL) 25 MG 24 hr tablet Take 25 mg by mouth every Monday, Wednesday, and Friday. At bedtime    . metoprolol tartrate (LOPRESSOR) 25 MG tablet Take 25 mg by mouth See admin instructions. Take BID on Sun/Tues/Thurs/Sat    . midodrine (PROAMATINE) 10 MG tablet Take one tablet (10 mg) by mouth on Monday, Wednesday, Friday 30 minutes prior to dialysis.    Marland Kitchen multivitamin (RENA-VIT) TABS tablet Take 1 tablet by mouth at bedtime.    . Nutritional Supplements (ADULT NUTRITIONAL SUPPLEMENT PO) Take 1 each by mouth daily. Magic Cup with dinner    . Nutritional Supplements (NEPRO PO) Take 1 Can by mouth daily.     . pantoprazole (PROTONIX) 40 MG tablet Take 1 tablet (40 mg total) by mouth daily.  0  . sevelamer carbonate (RENVELA) 800 MG tablet Take 1,600 mg by mouth 3 (three) times daily with meals.    . warfarin (COUMADIN) 2.5 MG tablet Take 2.5 mg by mouth See admin instructions. Take on  Sun-Tue-Thu-Sat    . warfarin (COUMADIN) 5 MG tablet Take 5 mg by mouth daily. Take on Mondays and Wednesdays and Fridays     No current facility-administered medications for this visit.    ROS:   General:  No weight loss, Fever, chills  HEENT: No recent headaches, no nasal bleeding, no visual changes, no sore throat  Neurologic: No dizziness, blackouts, seizures. No recent symptoms of stroke or mini- stroke. No recent episodes of slurred speech, or temporary blindness.  Cardiac: No recent episodes of chest pain/pressure, no shortness of breath at rest.  No shortness of breath with exertion.  Denies history of atrial fibrillation or irregular heartbeat  Vascular: No history of rest pain in feet.  No history of claudication.  No history of non-healing ulcer, No history of DVT   Pulmonary: No home oxygen, no productive cough, no hemoptysis,  No asthma or wheezing  Musculoskeletal:  [ ]  Arthritis, [ ]  Low back pain,  [ ]  Joint pain  Hematologic:No history of hypercoagulable state.  No history of easy bleeding.  No history of anemia  Gastrointestinal: No hematochezia or melena,  No gastroesophageal reflux, no trouble swallowing  Urinary: [X]  chronic Kidney disease, [X]  on HD - [X]  MWF or [ ]  TTHS, [ ]  Burning with urination, [ ]  Frequent urination, [ ]  Difficulty urinating;   Skin: No rashes  Psychological: No history of anxiety,  No history of depression   Physical Examination  Vitals:   12/27/19 0916  BP: (!) 83/49  Pulse: 85  Resp: 16  Temp: 98.2 F (36.8 C)  TempSrc: Temporal    There is no height or weight on file to calculate BMI.  General:  Alert and oriented, no acute distress HEENT: Normal Neck: No JVD Pulmonary: Clear to auscultation bilaterally Cardiac:  Regular Rate and Rhythm  Skin: No rash, aneurysmal segment mid fistula left upper arm with white and shiny skin and small area of ulceration not full-thickness, palpable thrill in fistula  ASSESSMENT:  Ulcerated left arm AV fistula at risk for bleeding  PLAN: Patient will be scheduled for revision of his fistula next week and we will place a tunneled dialysis catheter at the same time as I do not believe he will have enough room to continue to cannulate the fistula for a few weeks after we revise it.  Risk benefits possible complications and procedure details including not limited to bleeding infection fistula thrombosis were all explained to the patient today.  He understands and agrees to proceed.  He understands the procedure is being done to prevent abnormal bleeding from his AV fistula.  We will stop his warfarin 3 days prior to the procedure.   Ruta Hinds, MD [facility] Office: [phone] Pager: [phone]

## 2019-12-27 NOTE — Addendum Note (Signed)
Addended by: York Cerise C on: 12/27/2019 11:48 AM   Modules accepted: Orders

## 2019-12-27 NOTE — H&P (View-Only) (Signed)
Referring Physician: Juanell Fairly, NP  Patient name: [name] MRN: [id number] DOB: [birth year] Sex: male  REASON FOR CONSULT: Ulceration left arm AV fistula  HPI: [name] is a 78 y.o. male, who was noted to have an ulceration on his left arm AV fistula recently.  Patient dialyzes on Monday Wednesday Friday.  He is on warfarin for atrial fibrillation.  He is not really a very good historian and does not really recall if he has had any bleeding episodes from his AV fistula and does not really pay much attention to it.  Currently resides in a skilled nursing facility.  He has bilateral above-knee amputations.  Other medical problems include diabetes, hypertension, hyperlipidemia, sleep apnea all of which have been stable.  Past Medical History:  Diagnosis Date  . A-fib ([place])   . Anemia   . Blood transfusion   . BPH (benign prostatic hyperplasia)   . CHF (congestive heart failure) ([place])   . Diarrhea   . DM (diabetes mellitus) ([place])   . ESRD on hemodialysis ([place])    Started dialysis in 2009  . History of GI bleed    secondary to coumadin  . HTN (hypertension)   . Hyperlipidemia   . OSA (obstructive sleep apnea)    uses CPAP  . Secondary hyperparathyroidism of renal origin [facility])    Past Surgical History:  Procedure Laterality Date  . ABDOMINAL AORTOGRAM N/A 11/15/2018   Procedure: ABDOMINAL AORTOGRAM;  Surgeon: [name], MD;  Location: [place] [facility];  Service: Cardiovascular;  Laterality: N/A;  . AMPUTATION Left 12/06/2018   Procedure: AMPUTATION DIGIT LEFT FIFTH TOE;  Surgeon: Angelia Mould, MD;  Location: [place];  Service: Vascular;  Laterality: Left;  . AMPUTATION Bilateral 12/29/2018   Procedure: AMPUTATION ABOVE KNEE;  Surgeon: Waynetta Sandy, MD;  Location: [place];  Service: Vascular;  Laterality: Bilateral;  . APPLICATION OF WOUND VAC Right 02/17/2019   Procedure: Application Of Wound Vac;  Surgeon: Marty Heck, MD;  Location:  [place];  Service: Vascular;  Laterality: Right;  . BVT  123456   Left  Basilic Vein Transposition  . CHOLECYSTECTOMY    . EYE SURGERY     Catarct bil  . I & D EXTREMITY Right 02/17/2019   Procedure: Right above the kneee debridement;  Surgeon: Marty Heck, MD;  Location: [place];  Service: Vascular;  Laterality: Right;  . INSERTION OF DIALYSIS CATHETER  05/28/2012   Procedure: INSERTION OF DIALYSIS CATHETER;  Surgeon: Mal Misty, MD;  Location: [place];  Service: Vascular;  Laterality: Right;  . Left arm shuntogram.    . Left forearm loop graft with 6 mm Gore-Tex graft.    . LOWER EXTREMITY ANGIOGRAPHY Bilateral 11/15/2018   Procedure: LOWER EXTREMITY ANGIOGRAPHY;  Surgeon: [name], MD;  Location: [place] [facility];  Service: Cardiovascular;  Laterality: Bilateral;  . Pars plana vitrectomy with 25-gauge system    . PERIPHERAL VASCULAR ATHERECTOMY Left 11/15/2018   Procedure: PERIPHERAL VASCULAR ATHERECTOMY;  Surgeon: [name], MD;  Location: [place] [facility];  Service: Cardiovascular;  Laterality: Left;  SFA with STENT  . PERIPHERAL VASCULAR BALLOON ANGIOPLASTY Left 11/15/2018   Procedure: PERIPHERAL VASCULAR BALLOON ANGIOPLASTY;  Surgeon: [name], MD;  Location: [place] [facility];  Service: Cardiovascular;  Laterality: Left;  PT    Family History  Problem Relation Age of Onset  . Alzheimer's disease Mother   . Diabetes Father  Amputation:  bilateral legs  . Cancer Daughter        breast cancer  . Diabetes Son   . Heart disease Son        before age 27  . Hypertension Son   . Anesthesia problems Neg Hx     SOCIAL HISTORY: Social History   Socioeconomic History  . Marital status: Divorced    Spouse name: Not on file  . Number of children: Not on file  . Years of education: Not on file  . Highest education level: Not on file  Occupational History  . Not on file  Tobacco Use  . Smoking status: Former Smoker    Types: Cigarettes     Quit date: 06/30/2004    Years since quitting: 15.5  . Smokeless tobacco: Never Used  Substance and Sexual Activity  . Alcohol use: No  . Drug use: No  . Sexual activity: Never  Other Topics Concern  . Not on file  Social History Narrative     The patient is a former smoker, quit is 2005.  Does not     drink or abuse drugs.          Social Determinants of Health   Financial Resource Strain:   . Difficulty of Paying Living Expenses: Not on file  Food Insecurity:   . Worried About Charity fundraiser in the Last Year: Not on file  . Ran Out of Food in the Last Year: Not on file  Transportation Needs:   . Lack of Transportation (Medical): Not on file  . Lack of Transportation (Non-Medical): Not on file  Physical Activity:   . Days of Exercise per Week: Not on file  . Minutes of Exercise per Session: Not on file  Stress:   . Feeling of Stress : Not on file  Social Connections:   . Frequency of Communication with Friends and Family: Not on file  . Frequency of Social Gatherings with Friends and Family: Not on file  . Attends Religious Services: Not on file  . Active Member of Clubs or Organizations: Not on file  . Attends Archivist Meetings: Not on file  . Marital Status: Not on file  Intimate Partner Violence:   . Fear of Current or Ex-Partner: Not on file  . Emotionally Abused: Not on file  . Physically Abused: Not on file  . Sexually Abused: Not on file    Allergies  Allergen Reactions  . Occlusive Silicone Sheets [Silicone] Other (See Comments)    "Allergic," per MAR  . Other Other (See Comments)    Unknown reaction to Occlusive adhesive- "Allergic," per MAR  . Sulfa Antibiotics   . Tape Itching and Other (See Comments)    Use Cloth tape only, please    Current Outpatient Medications  Medication Sig Dispense Refill  . acetaminophen (TYLENOL) 500 MG tablet Take 1,000 mg by mouth 2 (two) times daily.     . Amino Acids-Protein Hydrolys (FEEDING  SUPPLEMENT, PRO-STAT SUGAR FREE 64,) LIQD Take 30 mLs by mouth 2 (two) times daily. 9a and 5p    . ascorbic acid (VITAMIN C) 500 MG tablet Take 500 mg by mouth daily.    Marland Kitchen atorvastatin (LIPITOR) 20 MG tablet Take 20 mg by mouth daily.    . Cholecalciferol (VITAMIN D3) 1.25 MG (50000 UT) CAPS Take 1 capsule by mouth once a week.    . dicyclomine (BENTYL) 20 MG tablet Take 20 mg by mouth 3 (three) times  daily before meals.    . divalproex (DEPAKOTE SPRINKLE) 125 MG capsule Take 125 mg by mouth every morning.    . divalproex (DEPAKOTE SPRINKLE) 125 MG capsule Take 250 mg by mouth at bedtime.    Marland Kitchen doxercalciferol (HECTOROL) 4 MCG/2ML injection Inject 2 mcg into the vein every Monday, Wednesday, and Friday with hemodialysis.    Marland Kitchen HYDROcodone-acetaminophen (NORCO) 5-325 MG tablet Take 1 tablet by mouth every 12 (twelve) hours. 180 tablet 0  . loperamide (IMODIUM A-D) 2 MG tablet Take 4 mg by mouth See admin instructions. Take 2 tablets ( 4 mg) by mouth after 1st loose stool as needed for diarrhea    . metoprolol succinate (TOPROL-XL) 25 MG 24 hr tablet Take 25 mg by mouth every Monday, Wednesday, and Friday. At bedtime    . metoprolol tartrate (LOPRESSOR) 25 MG tablet Take 25 mg by mouth See admin instructions. Take BID on Sun/Tues/Thurs/Sat    . midodrine (PROAMATINE) 10 MG tablet Take one tablet (10 mg) by mouth on Monday, Wednesday, Friday 30 minutes prior to dialysis.    Marland Kitchen multivitamin (RENA-VIT) TABS tablet Take 1 tablet by mouth at bedtime.    . Nutritional Supplements (ADULT NUTRITIONAL SUPPLEMENT PO) Take 1 each by mouth daily. Magic Cup with dinner    . Nutritional Supplements (NEPRO PO) Take 1 Can by mouth daily.     . pantoprazole (PROTONIX) 40 MG tablet Take 1 tablet (40 mg total) by mouth daily.  0  . sevelamer carbonate (RENVELA) 800 MG tablet Take 1,600 mg by mouth 3 (three) times daily with meals.    . warfarin (COUMADIN) 2.5 MG tablet Take 2.5 mg by mouth See admin instructions. Take on  Sun-Tue-Thu-Sat    . warfarin (COUMADIN) 5 MG tablet Take 5 mg by mouth daily. Take on Mondays and Wednesdays and Fridays     No current facility-administered medications for this visit.    ROS:   General:  No weight loss, Fever, chills  HEENT: No recent headaches, no nasal bleeding, no visual changes, no sore throat  Neurologic: No dizziness, blackouts, seizures. No recent symptoms of stroke or mini- stroke. No recent episodes of slurred speech, or temporary blindness.  Cardiac: No recent episodes of chest pain/pressure, no shortness of breath at rest.  No shortness of breath with exertion.  Denies history of atrial fibrillation or irregular heartbeat  Vascular: No history of rest pain in feet.  No history of claudication.  No history of non-healing ulcer, No history of DVT   Pulmonary: No home oxygen, no productive cough, no hemoptysis,  No asthma or wheezing  Musculoskeletal:  [ ]  Arthritis, [ ]  Low back pain,  [ ]  Joint pain  Hematologic:No history of hypercoagulable state.  No history of easy bleeding.  No history of anemia  Gastrointestinal: No hematochezia or melena,  No gastroesophageal reflux, no trouble swallowing  Urinary: [X]  chronic Kidney disease, [X]  on HD - [X]  MWF or [ ]  TTHS, [ ]  Burning with urination, [ ]  Frequent urination, [ ]  Difficulty urinating;   Skin: No rashes  Psychological: No history of anxiety,  No history of depression   Physical Examination  Vitals:   12/27/19 0916  BP: (!) 83/49  Pulse: 85  Resp: 16  Temp: 98.2 F (36.8 C)  TempSrc: Temporal    There is no height or weight on file to calculate BMI.  General:  Alert and oriented, no acute distress HEENT: Normal Neck: No JVD Pulmonary: Clear to auscultation bilaterally Cardiac:  Regular Rate and Rhythm  Skin: No rash, aneurysmal segment mid fistula left upper arm with white and shiny skin and small area of ulceration not full-thickness, palpable thrill in fistula  ASSESSMENT:  Ulcerated left arm AV fistula at risk for bleeding  PLAN: Patient will be scheduled for revision of his fistula next week and we will place a tunneled dialysis catheter at the same time as I do not believe he will have enough room to continue to cannulate the fistula for a few weeks after we revise it.  Risk benefits possible complications and procedure details including not limited to bleeding infection fistula thrombosis were all explained to the patient today.  He understands and agrees to proceed.  He understands the procedure is being done to prevent abnormal bleeding from his AV fistula.  We will stop his warfarin 3 days prior to the procedure.   Ruta Hinds, MD [facility] Office: [phone] Pager: [phone]

## 2019-12-27 NOTE — H&P (View-Only) (Signed)
Referring Physician: Juanell Fairly, NP  Patient name: [name] MRN: [id number] DOB: [birth year] Sex: male  REASON FOR CONSULT: Ulceration left arm AV fistula  HPI: [name] is a 78 y.o. male, who was noted to have an ulceration on his left arm AV fistula recently.  Patient dialyzes on Monday Wednesday Friday.  He is on warfarin for atrial fibrillation.  He is not really a very good historian and does not really recall if he has had any bleeding episodes from his AV fistula and does not really pay much attention to it.  Currently resides in a skilled nursing facility.  He has bilateral above-knee amputations.  Other medical problems include diabetes, hypertension, hyperlipidemia, sleep apnea all of which have been stable.  Past Medical History:  Diagnosis Date  . A-fib ([place])   . Anemia   . Blood transfusion   . BPH (benign prostatic hyperplasia)   . CHF (congestive heart failure) ([place])   . Diarrhea   . DM (diabetes mellitus) ([place])   . ESRD on hemodialysis ([place])    Started dialysis in 2009  . History of GI bleed    secondary to coumadin  . HTN (hypertension)   . Hyperlipidemia   . OSA (obstructive sleep apnea)    uses CPAP  . Secondary hyperparathyroidism of renal origin [facility])    Past Surgical History:  Procedure Laterality Date  . ABDOMINAL AORTOGRAM N/A 11/15/2018   Procedure: ABDOMINAL AORTOGRAM;  Surgeon: [name], MD;  Location: [place] [facility];  Service: Cardiovascular;  Laterality: N/A;  . AMPUTATION Left 12/06/2018   Procedure: AMPUTATION DIGIT LEFT FIFTH TOE;  Surgeon: Angelia Mould, MD;  Location: [place];  Service: Vascular;  Laterality: Left;  . AMPUTATION Bilateral 12/29/2018   Procedure: AMPUTATION ABOVE KNEE;  Surgeon: Waynetta Sandy, MD;  Location: [place];  Service: Vascular;  Laterality: Bilateral;  . APPLICATION OF WOUND VAC Right 02/17/2019   Procedure: Application Of Wound Vac;  Surgeon: Marty Heck, MD;  Location:  [place];  Service: Vascular;  Laterality: Right;  . BVT  123456   Left  Basilic Vein Transposition  . CHOLECYSTECTOMY    . EYE SURGERY     Catarct bil  . I & D EXTREMITY Right 02/17/2019   Procedure: Right above the kneee debridement;  Surgeon: Marty Heck, MD;  Location: [place];  Service: Vascular;  Laterality: Right;  . INSERTION OF DIALYSIS CATHETER  05/28/2012   Procedure: INSERTION OF DIALYSIS CATHETER;  Surgeon: Mal Misty, MD;  Location: [place];  Service: Vascular;  Laterality: Right;  . Left arm shuntogram.    . Left forearm loop graft with 6 mm Gore-Tex graft.    . LOWER EXTREMITY ANGIOGRAPHY Bilateral 11/15/2018   Procedure: LOWER EXTREMITY ANGIOGRAPHY;  Surgeon: [name], MD;  Location: [place] [facility];  Service: Cardiovascular;  Laterality: Bilateral;  . Pars plana vitrectomy with 25-gauge system    . PERIPHERAL VASCULAR ATHERECTOMY Left 11/15/2018   Procedure: PERIPHERAL VASCULAR ATHERECTOMY;  Surgeon: [name], MD;  Location: [place] [facility];  Service: Cardiovascular;  Laterality: Left;  SFA with STENT  . PERIPHERAL VASCULAR BALLOON ANGIOPLASTY Left 11/15/2018   Procedure: PERIPHERAL VASCULAR BALLOON ANGIOPLASTY;  Surgeon: [name], MD;  Location: [place] [facility];  Service: Cardiovascular;  Laterality: Left;  PT    Family History  Problem Relation Age of Onset  . Alzheimer's disease Mother   . Diabetes Father  Amputation:  bilateral legs  . Cancer Daughter        breast cancer  . Diabetes Son   . Heart disease Son        before age 53  . Hypertension Son   . Anesthesia problems Neg Hx     SOCIAL HISTORY: Social History   Socioeconomic History  . Marital status: Divorced    Spouse name: Not on file  . Number of children: Not on file  . Years of education: Not on file  . Highest education level: Not on file  Occupational History  . Not on file  Tobacco Use  . Smoking status: Former Smoker    Types: Cigarettes     Quit date: 06/30/2004    Years since quitting: 15.5  . Smokeless tobacco: Never Used  Substance and Sexual Activity  . Alcohol use: No  . Drug use: No  . Sexual activity: Never  Other Topics Concern  . Not on file  Social History Narrative     The patient is a former smoker, quit is 2005.  Does not     drink or abuse drugs.          Social Determinants of Health   Financial Resource Strain:   . Difficulty of Paying Living Expenses: Not on file  Food Insecurity:   . Worried About Charity fundraiser in the Last Year: Not on file  . Ran Out of Food in the Last Year: Not on file  Transportation Needs:   . Lack of Transportation (Medical): Not on file  . Lack of Transportation (Non-Medical): Not on file  Physical Activity:   . Days of Exercise per Week: Not on file  . Minutes of Exercise per Session: Not on file  Stress:   . Feeling of Stress : Not on file  Social Connections:   . Frequency of Communication with Friends and Family: Not on file  . Frequency of Social Gatherings with Friends and Family: Not on file  . Attends Religious Services: Not on file  . Active Member of Clubs or Organizations: Not on file  . Attends Archivist Meetings: Not on file  . Marital Status: Not on file  Intimate Partner Violence:   . Fear of Current or Ex-Partner: Not on file  . Emotionally Abused: Not on file  . Physically Abused: Not on file  . Sexually Abused: Not on file    Allergies  Allergen Reactions  . Occlusive Silicone Sheets [Silicone] Other (See Comments)    "Allergic," per MAR  . Other Other (See Comments)    Unknown reaction to Occlusive adhesive- "Allergic," per MAR  . Sulfa Antibiotics   . Tape Itching and Other (See Comments)    Use Cloth tape only, please    Current Outpatient Medications  Medication Sig Dispense Refill  . acetaminophen (TYLENOL) 500 MG tablet Take 1,000 mg by mouth 2 (two) times daily.     . Amino Acids-Protein Hydrolys (FEEDING  SUPPLEMENT, PRO-STAT SUGAR FREE 64,) LIQD Take 30 mLs by mouth 2 (two) times daily. 9a and 5p    . ascorbic acid (VITAMIN C) 500 MG tablet Take 500 mg by mouth daily.    Marland Kitchen atorvastatin (LIPITOR) 20 MG tablet Take 20 mg by mouth daily.    . Cholecalciferol (VITAMIN D3) 1.25 MG (50000 UT) CAPS Take 1 capsule by mouth once a week.    . dicyclomine (BENTYL) 20 MG tablet Take 20 mg by mouth 3 (three) times  daily before meals.    . divalproex (DEPAKOTE SPRINKLE) 125 MG capsule Take 125 mg by mouth every morning.    . divalproex (DEPAKOTE SPRINKLE) 125 MG capsule Take 250 mg by mouth at bedtime.    Marland Kitchen doxercalciferol (HECTOROL) 4 MCG/2ML injection Inject 2 mcg into the vein every Monday, Wednesday, and Friday with hemodialysis.    Marland Kitchen HYDROcodone-acetaminophen (NORCO) 5-325 MG tablet Take 1 tablet by mouth every 12 (twelve) hours. 180 tablet 0  . loperamide (IMODIUM A-D) 2 MG tablet Take 4 mg by mouth See admin instructions. Take 2 tablets ( 4 mg) by mouth after 1st loose stool as needed for diarrhea    . metoprolol succinate (TOPROL-XL) 25 MG 24 hr tablet Take 25 mg by mouth every Monday, Wednesday, and Friday. At bedtime    . metoprolol tartrate (LOPRESSOR) 25 MG tablet Take 25 mg by mouth See admin instructions. Take BID on Sun/Tues/Thurs/Sat    . midodrine (PROAMATINE) 10 MG tablet Take one tablet (10 mg) by mouth on Monday, Wednesday, Friday 30 minutes prior to dialysis.    Marland Kitchen multivitamin (RENA-VIT) TABS tablet Take 1 tablet by mouth at bedtime.    . Nutritional Supplements (ADULT NUTRITIONAL SUPPLEMENT PO) Take 1 each by mouth daily. Magic Cup with dinner    . Nutritional Supplements (NEPRO PO) Take 1 Can by mouth daily.     . pantoprazole (PROTONIX) 40 MG tablet Take 1 tablet (40 mg total) by mouth daily.  0  . sevelamer carbonate (RENVELA) 800 MG tablet Take 1,600 mg by mouth 3 (three) times daily with meals.    . warfarin (COUMADIN) 2.5 MG tablet Take 2.5 mg by mouth See admin instructions. Take on  Sun-Tue-Thu-Sat    . warfarin (COUMADIN) 5 MG tablet Take 5 mg by mouth daily. Take on Mondays and Wednesdays and Fridays     No current facility-administered medications for this visit.    ROS:   General:  No weight loss, Fever, chills  HEENT: No recent headaches, no nasal bleeding, no visual changes, no sore throat  Neurologic: No dizziness, blackouts, seizures. No recent symptoms of stroke or mini- stroke. No recent episodes of slurred speech, or temporary blindness.  Cardiac: No recent episodes of chest pain/pressure, no shortness of breath at rest.  No shortness of breath with exertion.  Denies history of atrial fibrillation or irregular heartbeat  Vascular: No history of rest pain in feet.  No history of claudication.  No history of non-healing ulcer, No history of DVT   Pulmonary: No home oxygen, no productive cough, no hemoptysis,  No asthma or wheezing  Musculoskeletal:  [ ]  Arthritis, [ ]  Low back pain,  [ ]  Joint pain  Hematologic:No history of hypercoagulable state.  No history of easy bleeding.  No history of anemia  Gastrointestinal: No hematochezia or melena,  No gastroesophageal reflux, no trouble swallowing  Urinary: [X]  chronic Kidney disease, [X]  on HD - [X]  MWF or [ ]  TTHS, [ ]  Burning with urination, [ ]  Frequent urination, [ ]  Difficulty urinating;   Skin: No rashes  Psychological: No history of anxiety,  No history of depression   Physical Examination  Vitals:   12/27/19 0916  BP: (!) 83/49  Pulse: 85  Resp: 16  Temp: 98.2 F (36.8 C)  TempSrc: Temporal    There is no height or weight on file to calculate BMI.  General:  Alert and oriented, no acute distress HEENT: Normal Neck: No JVD Pulmonary: Clear to auscultation bilaterally Cardiac:  Regular Rate and Rhythm  Skin: No rash, aneurysmal segment mid fistula left upper arm with white and shiny skin and small area of ulceration not full-thickness, palpable thrill in fistula  ASSESSMENT:  Ulcerated left arm AV fistula at risk for bleeding  PLAN: Patient will be scheduled for revision of his fistula next week and we will place a tunneled dialysis catheter at the same time as I do not believe he will have enough room to continue to cannulate the fistula for a few weeks after we revise it.  Risk benefits possible complications and procedure details including not limited to bleeding infection fistula thrombosis were all explained to the patient today.  He understands and agrees to proceed.  He understands the procedure is being done to prevent abnormal bleeding from his AV fistula.  We will stop his warfarin 3 days prior to the procedure.   Ruta Hinds, MD [facility] Office: [phone] Pager: [phone]

## 2019-12-28 ENCOUNTER — Encounter (HOSPITAL_COMMUNITY): Payer: Self-pay | Admitting: Vascular Surgery

## 2019-12-28 ENCOUNTER — Telehealth: Payer: Self-pay | Admitting: *Deleted

## 2019-12-28 ENCOUNTER — Other Ambulatory Visit: Payer: Self-pay

## 2019-12-28 ENCOUNTER — Non-Acute Institutional Stay (SKILLED_NURSING_FACILITY): Payer: Medicare Other | Admitting: Internal Medicine

## 2019-12-28 DIAGNOSIS — Z7901 Long term (current) use of anticoagulants: Secondary | ICD-10-CM | POA: Diagnosis not present

## 2019-12-28 DIAGNOSIS — I480 Paroxysmal atrial fibrillation: Secondary | ICD-10-CM | POA: Diagnosis not present

## 2019-12-28 DIAGNOSIS — Z23 Encounter for immunization: Secondary | ICD-10-CM | POA: Diagnosis not present

## 2019-12-28 NOTE — Telephone Encounter (Signed)
Call and spoke with CYNTHIA at [facility]. To have patient at [facility] admitting at 5:30 am on 01/02/2020 for surgery. No other changes in pre-op instructions. Verbalized understanding.

## 2019-12-28 NOTE — Progress Notes (Signed)
I called [facility] and spoke with Barnett Applebaum , patient's nurse. Barnett Applebaum reports that Mr. Irigoyen' INR was 1.6 today, per orders from Dr Oneida Alar , Coumadin is to be held after today. Barnett Applebaum reports that Depakote Sprinkles - patient takes in the capsule.  Mr Demint will have Covid test on arrival to the hospital.

## 2019-12-28 NOTE — Pre-Procedure Instructions (Addendum)
    [name]  12/28/2019  Your procedure is scheduled on 12/31/2018     Report to [facility], Main Entrance or Entrance "A" at 5:30 A.M.  Call this number if you have problems the morning of surgery:[phone]- this is the pre- surgery desk.  >>>>>Please send patient's Medication Record with medications administrated documentation. ( this information is required prior to OR. This includes medications that may have been on hold for surgery)<<<<   Remember:  Do not eat or drink after midnight Monday, January 4.    Take these medicines the morning of surgery with A SIP OF WATER:  Depakote, Metoprolol, Protonix  If needed - Hydrocodone- Acetamionphen              Shower/ wash up with antibacteria soap, brush teeth and wear clean clothes.  Do not wear jewelry, make-up or nail polish.  Do not wear lotions, powders, or perfumes, or deodorant.  Do not bring valuables to the hospital.  [facility] is not responsible for any belongings or valuables.  Orders are from [facility].

## 2019-12-29 ENCOUNTER — Encounter: Payer: Self-pay | Admitting: Internal Medicine

## 2019-12-29 NOTE — Progress Notes (Signed)
Location:  [facility] Room Number: 414-D Place of Service:  SNF [phone]) Provider:  Granville Lewis, PA-C   Patient Care Team: Hennie Duos, MD as PCP - General (Internal Medicine) [name], MD as Consulting Physician (Nephrology) Rehab, [facility], [facility]  Extended Emergency Contact Information Primary Emergency Contact: [name],[name] Address: [place], [place] [address] [name] of [place] Phone: [phone] Work Phone: [phone] Mobile Phone: [phone] Relation: Daughter Secondary Emergency Contact: [name] Mobile Phone: [phone] Relation: Other  Code Status:  DNR Goals of care: Advanced Directive information Advanced Directives 12/29/2019  Does Patient Have a Medical Advance Directive? Yes  Type of Advance Directive Out of facility DNR (pink MOST or yellow form)  Does patient want to make changes to medical advance directive? No - Patient declined  Copy of [place] in Chart? -  Would patient like information on creating a medical advance directive? -  Pre-existing out of facility DNR order (yellow form or pink MOST form) Yellow form placed in chart (order not valid for inpatient use);Pink MOST form placed in chart (order not valid for inpatient use)     Chief Complaint  Patient presents with  . Anticoagulation    INR management    HPI:  Pt is a 79 y.o. male seen today for an acute visit for follow-up of Coumadin management.-Patient is on chronic Coumadin with a history of atrial fibrillation.  Last week INR was high at 8.0 and Coumadin was held until INR went down to 2.6 and then 2.5 mg a day was started.  INR today is 1.6.  However this is complicated with orders to hold the Coumadin tomorrow per vascular secondary to patient having shunt revision early next week.  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  Patient also received the Moderna vaccine for coronavirus today-he appears to be tolerating this well-initially nurse thought he was not quite himself but when I got in the room he was about to eat his lunch-- he denied any swelling difficulty or pain or difficulty breathing and mental status appear to be at baseline-at times he does not talk much and then starts talking which was the case today and I did observe him for a while  and he did eat his food and appeared to be swallowing at baseline Vital signs remained stable blood pressure was 98/58 but at times he does have slightly low blood pressures-he was asymptomatic of significant hypotension-blood pressures are variable he is on midodrine on dialysis days because of hypotension.      Past Medical History:  Diagnosis Date  . A-fib ([place])   . Anemia   . Blood transfusion   . BPH (benign prostatic hyperplasia)   . CHF (congestive heart failure) ([place])   . Diarrhea   . 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 ESRD on hemodialysis ([place])    Started dialysis in 2009  . History of GI bleed    secondary to coumadin  . HTN (hypertension)   . Hyperlipidemia   . OSA (obstructive sleep apnea)    uses CPAP  . Sacral wound    stage II - healed, has a protective dressing on- 12/28/2019  . Secondary hyperparathyroidism of renal origin [facility])    Past Surgical History:  Procedure Laterality Date  . ABDOMINAL AORTOGRAM N/A 11/15/2018   Procedure: ABDOMINAL AORTOGRAM;  Surgeon: [name], MD;  Location: [facility] [place] CV LAB;  Service: Cardiovascular;  Laterality: N/A;  . AMPUTATION Left 12/06/2018   Procedure: AMPUTATION DIGIT LEFT FIFTH TOE;  Surgeon: Angelia Mould, MD;  Location: [facility];  Service: Vascular;  Laterality: Left;  . AMPUTATION Bilateral 12/29/2018   Procedure: AMPUTATION ABOVE KNEE;  Surgeon: Waynetta [facility], MD;  Location: [facility];  Service:  Vascular;  Laterality: Bilateral;  . APPLICATION OF WOUND VAC Right 02/17/2019   Procedure: Application Of Wound Vac;  Surgeon: [facility], MD;  Location: [facility];  Service: Vascular;  Laterality: Right;  . BVT  123456   Left  Basilic Vein Transposition  . CHOLECYSTECTOMY    . EYE SURGERY     Catarct bil  . I & D EXTREMITY Right 02/17/2019   Procedure: Right above the kneee debridement;  Surgeon: Marty [facility], MD;  Location: [place];  Service: Vascular;  Laterality: Right;  . INSERTION OF DIALYSIS CATHETER  05/28/2012   Procedure: INSERTION OF DIALYSIS CATHETER;  Surgeon: Mal Misty, MD;  Location: [place];  Service: Vascular;  Laterality: Right;  . Left arm shuntogram.    . Left forearm loop graft with 6 mm Gore-Tex graft.    . LOWER EXTREMITY ANGIOGRAPHY Bilateral 11/15/2018   Procedure: LOWER EXTREMITY ANGIOGRAPHY;  Surgeon: [name], MD;  Location: [facility] CV LAB;  Service: Cardiovascular;  Laterality: Bilateral;  . Pars plana vitrectomy with 25-gauge system    . PERIPHERAL VASCULAR ATHERECTOMY Left 11/15/2018   Procedure: PERIPHERAL VASCULAR ATHERECTOMY;  Surgeon: [name], MD;  Location: [facility] CV LAB;  Service: Cardiovascular;  Laterality: Left;  SFA with STENT  . PERIPHERAL VASCULAR BALLOON ANGIOPLASTY Left 11/15/2018   Procedure: PERIPHERAL VASCULAR BALLOON ANGIOPLASTY;  Surgeon: [name], MD;  Location: [place] CV LAB;  Service: Cardiovascular;  Laterality: Left;  PT    Allergies  Allergen Reactions  . Occlusive Silicone Sheets [Silicone] Other (See Comments)    "Allergic," per MAR  . Other Other (See Comments)    Unknown reaction to Occlusive adhesive- "Allergic," per MAR  . Sulfa Antibiotics   . Tape Itching and Other (See Comments)    Use Cloth tape only, please    Outpatient Encounter Medications as of 12/28/2019  Medication Sig  . acetaminophen (TYLENOL) 500 MG tablet Take 1,000 mg by mouth 2 (two) times daily.   .  Amino Acids-Protein Hydrolys (FEEDING SUPPLEMENT, PRO-STAT SUGAR FREE 64,) LIQD Take 30 mLs by mouth 2 (two) times daily. 9a and 5p  . ascorbic acid (VITAMIN C) 500 MG tablet Take 500 mg by mouth daily.  Marland Kitchen atorvastatin (LIPITOR) 20 MG tablet Take 20 mg by mouth daily.  . Cholecalciferol (VITAMIN D3) 1.25 MG (50000 UT) CAPS Take 50,000 Units by mouth every Friday.   . dicyclomine (BENTYL) 20 MG tablet Take 20 mg by mouth 3 (three) times daily before meals.  . divalproex (DEPAKOTE SPRINKLE) 125 MG capsule Take 125-250 mg by mouth See admin instructions. Take 125mg  in the AM and 250mg  in the PM.  . divalproex (DEPAKOTE SPRINKLE) 125 MG capsule Take 250 mg by mouth at bedtime.  Marland Kitchen doxercalciferol (HECTOROL) 4 MCG/2ML injection Inject 2 mcg into the vein every Monday, Wednesday, and Friday with hemodialysis.  Marland Kitchen HYDROcodone-acetaminophen (NORCO) 5-325 MG tablet Take 1 tablet by mouth every 12 (twelve) hours.  Marland Kitchen loperamide (IMODIUM  A-D) 2 MG tablet Take 4 mg by mouth See admin instructions. Take 2 tablets ( 4 mg) by mouth after 1st loose stool as needed for diarrhea  . metoprolol tartrate (LOPRESSOR) 25 MG tablet Take 25 mg by mouth See admin instructions. On Mon, Wed, Fri take 25mg  at bedtime. On all other days take 25mg  twice a day.  . midodrine (PROAMATINE) 10 MG tablet Take one tablet (10 mg) by mouth on Monday, Wednesday, Friday 30 minutes prior to dialysis.  Marland Kitchen multivitamin (RENA-VIT) TABS tablet Take 1 tablet by mouth at bedtime.  . Nutritional Supplements (ADULT NUTRITIONAL SUPPLEMENT PO) Take 1 each by mouth daily with supper. Magic Cup with dinner  . Nutritional Supplements (NEPRO PO) Take 1 Can by mouth daily.   . pantoprazole (PROTONIX) 40 MG tablet Take 1 tablet (40 mg total) by mouth daily.  . sevelamer carbonate (RENVELA) 800 MG tablet Take 1,600 mg by mouth 3 (three) times daily with meals.  . warfarin (COUMADIN) 2.5 MG tablet Take 2.5 mg by mouth daily at 6 PM.   . zinc sulfate 220 (50 Zn)  MG capsule Take 220 mg by mouth every other day.   No facility-administered encounter medications on file as of 12/28/2019.    Review of Systems   This is somewhat limited since patient is a poor historian.  General is not complaining of any fever or chills.  Skin no complaints of itching or rashes.  Head ears eyes nose mouth and throat does not complain of visual changes or sore throat.  Respiratory is not complaining of any shortness of breath or cough.  Cardiac no complaints of chest pain or increased edema.  GI does not complain of abdominal discomfort appears to be eating at baseline without nausea or vomiting.  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  Neurologic does not complain of being dizzy or syncopal or having a headache.  And psych does have some cognitive issues but does not complain of being depressed again at times he will not talk much and then he starts talking which was the case today  Immunization History  Administered Date(s) Administered  . Influenza-Unspecified 08/29/2019  . Pneumococcal Polysaccharide-23 10/07/2019   Pertinent  Health Maintenance Due  Topic Date Due  . FOOT EXAM  12/29/2019 (Originally 11/01/1951)  . OPHTHALMOLOGY EXAM  12/29/2019 (Originally 11/01/1951)  . HEMOGLOBIN A1C  03/07/2020  . PNA vac Low Risk Adult (2 of 2 - PCV13) 10/06/2020  . INFLUENZA VACCINE  Completed  . URINE MICROALBUMIN  Discontinued   No flowsheet data found. Functional Status Survey:    Vitals:   12/28/19 1656  BP: (!) 98/58  Pulse: 83  Resp: 17  Temp: 97.8 F (36.6 C)  TempSrc: Oral  Weight: 117 lb 12.8 oz (53.4 kg)  Height: 3' (0.914 m)   Body mass index is 63.91 kg/m. Physical Exam   General this is a pleasant elderly male in no distress sitting comfortably in his chair he is about to eat lunch.  His skin is warm and dry I do not note any increased rashes.  Head ears eyes nose mouth and throat is not  complaining of any difficulty swallowing or visual changes.  Respiratory is not complaining of any shortness of breath no cough has been noted.  Cardiac no swelling has been noted does not complain of chest pain.  GI no complaints of abdominal discomfort or nausea no vomiting noted he is swallowing at baseline.  GU  does have end-stage renal disease is not complaining of dysuria or suprapubic pain.  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  Neurologic is grossly intact cannot appreciate lateralizing findings his speech is clear.  Psych he is pleasant and appropriate appears to be at baseline.  Labs.  December 28, 2019.  INR 1.6.    Labs reviewed: Recent Labs    02/20/19 1300 02/27/19 0000 05/28/19 0253 05/29/19 0541 05/29/19 2316 05/30/19 0727 05/31/19 1319 06/06/19 0000 07/27/19 1529 08/16/19 1818  NA 137   < > 137  --   --  137 136 143 138 136  K 3.0*   < > 3.7  --   --  5.1 3.6 3.7 3.5 4.0  CL 101   < > 102  --   --  101 98  --  95* 97*  CO2 23   < > 21*  --   --  17* 24  --  26 25  GLUCOSE 101*   < > 151*  --   --  99 140*  --  143* 75  BUN 27*   < > 80*  --   --  158* 44* 44* 48* 28*  CREATININE 3.41*   < > 6.12*  --   --  8.32* 4.29* 3.5* 4.61* 3.59*  CALCIUM 7.7*   < > 9.3  --   --  10.0 9.1  --  10.2 9.5  MG  --    < > 2.0 2.1 2.4  --   --   --   --   --   PHOS 2.4*  --   --   --   --  2.6 3.0  --   --   --    < > = values in this interval not displayed.   Recent Labs    05/27/19 0348 05/28/19 0253 05/30/19 0727 05/31/19 1319 08/16/19 1818  AST 55* 31  --   --  54*  ALT 55* 42  --   --  38  ALKPHOS 272* 261*  --   --  229*  BILITOT 0.8 0.6  --   --  1.2  PROT 7.0 6.5  --   --  7.8  ALBUMIN 2.4* 2.2* 2.3* 2.3* 2.9*   Recent Labs    02/27/19 0000 05/31/19 1319 06/06/19 0000 07/27/19 1529 08/16/19 1818  WBC  --  10.3 11.5 18.6* 6.9  NEUTROABS  --   --  8 15.5* 4.4    HGB   < > 7.6* 9.3* 12.4* 11.0*  HCT   < > 24.7* 29* 40.2 34.8*  MCV  --  88.5  --  89.3 89.5  PLT   < > 220 251 171 193   < > = values in this interval not displayed.   Lab Results  Component Value Date   TSH 1.839 05/25/2019   Lab Results  Component Value Date   HGBA1C 4.7 09/08/2019   Lab Results  Component Value Date   CHOL 113 11/11/2018   HDL 39 (L) 11/11/2018   LDLCALC 62 11/11/2018   TRIG 60 11/11/2018   CHOLHDL 2.9 11/11/2018    Significant Diagnostic Results in last 30 days:  No results found.  Assessment/Plan #1 anticoagulation management with history of A. fib on chronic Coumadin-INR was supratherapeutic-it is now slightly subtherapeutic--however Coumadin is scheduled to be held starting tomorrow secondary to procedure on his shunt by vascular surgery early  next week.  At this point will continue current dose of 2.5 mg a day for tonight and have the Coumadin held starting tomorrow per vascular consult.  Clinically he appears stable.  Rate appears to be controlled on Lopressor.  2.  History of Covidien 19 vaccine-he appears to be tolerating this well staff will continue to monitor him but he appears to be at his baseline.  [id number] note greater than 25 minutes spent assessing patient-discussing his status with nursing and reviewing his chart and labs and coordinating a plan of care.      Granville Lewis, PA-C [phone]

## 2019-12-29 NOTE — Progress Notes (Deleted)
Location:  [facility] Room Number: 414-D Place of Service:  SNF [id number]) Provider:  Granville Lewis, PA-C  Patient Care Team: Hennie Duos, MD as PCP - General (Internal Medicine) [name], MD as Consulting Physician (Nephrology) Rehab, [facility], [facility]  Extended Emergency Contact Information Primary Emergency Contact: [name],[name] Address: [place], [place] [address] [name] of [place] Phone: [phone] Work Phone: [phone] Mobile Phone: [phone] Relation: Daughter Secondary Emergency Contact: [name] Mobile Phone: [phone] Relation: Other  Code Status:  DNR Goals of care: Advanced Directive information Advanced Directives 12/12/2019  Does Patient Have a Medical Advance Directive? Yes  Type of Advance Directive Out of facility DNR (pink MOST or yellow form)  Does patient want to make changes to medical advance directive? No - Patient declined  Copy of [place] in Chart? -  Would patient like information on creating a medical advance directive? -  Pre-existing out of facility DNR order (yellow form or pink MOST form) Yellow form placed in chart (order not valid for inpatient use);Pink MOST form placed in chart (order not valid for inpatient use)     Chief Complaint  Patient presents with  . Anticoagulation    INR management    HPI:  Pt is a 79 y.o. male seen today for medical management of chronic diseases.     Past Medical History:  Diagnosis Date  . A-fib ([place])   . Anemia   . Blood transfusion   . BPH (benign prostatic hyperplasia)   . CHF (congestive heart failure) ([place])   . Diarrhea   . 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 ESRD on hemodialysis ([place])    Started dialysis in 2009  . History of GI bleed    secondary to coumadin  . HTN (hypertension)   .  Hyperlipidemia   . OSA (obstructive sleep apnea)    uses CPAP  . Sacral wound    stage II - healed, has a protective dressing on- 12/28/2019  . Secondary hyperparathyroidism of renal origin [facility])    Past Surgical History:  Procedure Laterality Date  . ABDOMINAL AORTOGRAM N/A 11/15/2018   Procedure: ABDOMINAL AORTOGRAM;  Surgeon: [name], MD;  Location: [facility] CV LAB;  Service: Cardiovascular;  Laterality: N/A;  . AMPUTATION Left 12/06/2018   Procedure: AMPUTATION DIGIT LEFT FIFTH TOE;  Surgeon: Angelia Mould, MD;  Location: [facility];  Service: Vascular;  Laterality: Left;  . AMPUTATION Bilateral 12/29/2018   Procedure: AMPUTATION ABOVE KNEE;  Surgeon: Waynetta [facility], MD;  Location: [facility];  Service: Vascular;  Laterality: Bilateral;  . APPLICATION OF WOUND VAC Right 02/17/2019   Procedure: Application Of Wound Vac;  Surgeon: Marty Heck, MD;  Location: [facility];  Service: Vascular;  Laterality: Right;  . BVT  123456   Left  Basilic Vein Transposition  . CHOLECYSTECTOMY    . EYE SURGERY     Catarct bil  . I & D EXTREMITY Right 02/17/2019   Procedure: Right above the kneee debridement;  Surgeon: Marty Heck, MD;  Location: [facility];  Service: Vascular;  Laterality: Right;  . INSERTION OF DIALYSIS CATHETER  05/28/2012   Procedure: INSERTION OF DIALYSIS CATHETER;  Surgeon: Mal Misty, MD;  Location: [facility];  Service: Vascular;  Laterality: Right;  .  Left arm shuntogram.    . Left forearm loop graft with 6 mm Gore-Tex graft.    . LOWER EXTREMITY ANGIOGRAPHY Bilateral 11/15/2018   Procedure: LOWER EXTREMITY ANGIOGRAPHY;  Surgeon: [name], MD;  Location: [facility];  Service: Cardiovascular;  Laterality: Bilateral;  . Pars plana vitrectomy with 25-gauge system    . PERIPHERAL VASCULAR ATHERECTOMY Left 11/15/2018   Procedure: PERIPHERAL VASCULAR ATHERECTOMY;  Surgeon: [name], MD;  Location: [facility] LAB;  Service:  Cardiovascular;  Laterality: Left;  SFA with STENT  . PERIPHERAL VASCULAR BALLOON ANGIOPLASTY Left 11/15/2018   Procedure: PERIPHERAL VASCULAR BALLOON ANGIOPLASTY;  Surgeon: [name], MD;  Location: [facility];  Service: Cardiovascular;  Laterality: Left;  PT    Allergies  Allergen Reactions  . Occlusive Silicone Sheets [Silicone] Other (See Comments)    "Allergic," per MAR  . Other Other (See Comments)    Unknown reaction to Occlusive adhesive- "Allergic," per MAR  . Sulfa Antibiotics   . Tape Itching and Other (See Comments)    Use Cloth tape only, please    Outpatient Encounter Medications as of 12/28/2019  Medication Sig  . acetaminophen (TYLENOL) 500 MG tablet Take 1,000 mg by mouth 2 (two) times daily.   . Amino Acids-Protein Hydrolys (FEEDING SUPPLEMENT, PRO-STAT SUGAR FREE 64,) LIQD Take 30 mLs by mouth 2 (two) times daily. 9a and 5p  . ascorbic acid (VITAMIN C) 500 MG tablet Take 500 mg by mouth daily.  Marland Kitchen atorvastatin (LIPITOR) 20 MG tablet Take 20 mg by mouth daily.  . Cholecalciferol (VITAMIN D3) 1.25 MG (50000 UT) CAPS Take 50,000 Units by mouth every Friday.   . dicyclomine (BENTYL) 20 MG tablet Take 20 mg by mouth 3 (three) times daily before meals.  . divalproex (DEPAKOTE SPRINKLE) 125 MG capsule Take 125-250 mg by mouth See admin instructions. Take 125mg  in the AM and 250mg  in the PM.  . divalproex (DEPAKOTE SPRINKLE) 125 MG capsule Take 250 mg by mouth at bedtime.  Marland Kitchen doxercalciferol (HECTOROL) 4 MCG/2ML injection Inject 2 mcg into the vein every Monday, Wednesday, and Friday with hemodialysis.  Marland Kitchen HYDROcodone-acetaminophen (NORCO) 5-325 MG tablet Take 1 tablet by mouth every 12 (twelve) hours.  Marland Kitchen loperamide (IMODIUM A-D) 2 MG tablet Take 4 mg by mouth See admin instructions. Take 2 tablets ( 4 mg) by mouth after 1st loose stool as needed for diarrhea  . metoprolol tartrate (LOPRESSOR) 25 MG tablet Take 25 mg by mouth See admin instructions. On Mon, Wed,  Fri take 25mg  at bedtime. On all other days take 25mg  twice a day.  . midodrine (PROAMATINE) 10 MG tablet Take one tablet (10 mg) by mouth on Monday, Wednesday, Friday 30 minutes prior to dialysis.  Marland Kitchen multivitamin (RENA-VIT) TABS tablet Take 1 tablet by mouth at bedtime.  . Nutritional Supplements (ADULT NUTRITIONAL SUPPLEMENT PO) Take 1 each by mouth daily with supper. Magic Cup with dinner  . Nutritional Supplements (NEPRO PO) Take 1 Can by mouth daily.   . pantoprazole (PROTONIX) 40 MG tablet Take 1 tablet (40 mg total) by mouth daily.  . sevelamer carbonate (RENVELA) 800 MG tablet Take 1,600 mg by mouth 3 (three) times daily with meals.  . warfarin (COUMADIN) 2.5 MG tablet Take 2.5 mg by mouth daily at 6 PM.   . zinc sulfate 220 (50 Zn) MG capsule Take 220 mg by mouth every other day.   No facility-administered encounter medications on file as of 12/28/2019.  Review of Systems  Immunization History  Administered Date(s) Administered  . Influenza-Unspecified 08/29/2019  . Pneumococcal Polysaccharide-23 10/07/2019   Pertinent  Health Maintenance Due  Topic Date Due  . FOOT EXAM  12/29/2019 (Originally 11/01/1951)  . OPHTHALMOLOGY EXAM  12/29/2019 (Originally 11/01/1951)  . HEMOGLOBIN A1C  03/07/2020  . PNA vac Low Risk Adult (2 of 2 - PCV13) 10/06/2020  . INFLUENZA VACCINE  Completed  . URINE MICROALBUMIN  Discontinued   No flowsheet data found. Functional Status Survey:    Vitals:   12/28/19 1656  BP: (!) 98/58  Pulse: 83  Resp: 17  Temp: 97.8 F (36.6 C)  TempSrc: Oral  Weight: 117 lb 12.8 oz (53.4 kg)  Height: 3' (0.914 m)   Body mass index is 63.91 kg/m. Physical Exam  Labs reviewed: Recent Labs    02/20/19 1300 02/27/19 0000 05/28/19 0253 05/29/19 0541 05/29/19 2316 05/30/19 0727 05/31/19 1319 06/06/19 0000 07/27/19 1529 08/16/19 1818  NA 137   < > 137  --   --  137 136 143 138 136  K 3.0*   < > 3.7  --   --  5.1 3.6 3.7 3.5 4.0  CL 101   < > 102   --   --  101 98  --  95* 97*  CO2 23   < > 21*  --   --  17* 24  --  26 25  GLUCOSE 101*   < > 151*  --   --  99 140*  --  143* 75  BUN 27*   < > 80*  --   --  158* 44* 44* 48* 28*  CREATININE 3.41*   < > 6.12*  --   --  8.32* 4.29* 3.5* 4.61* 3.59*  CALCIUM 7.7*   < > 9.3  --   --  10.0 9.1  --  10.2 9.5  MG  --    < > 2.0 2.1 2.4  --   --   --   --   --   PHOS 2.4*  --   --   --   --  2.6 3.0  --   --   --    < > = values in this interval not displayed.   Recent Labs    05/27/19 0348 05/28/19 0253 05/30/19 0727 05/31/19 1319 08/16/19 1818  AST 55* 31  --   --  54*  ALT 55* 42  --   --  38  ALKPHOS 272* 261*  --   --  229*  BILITOT 0.8 0.6  --   --  1.2  PROT 7.0 6.5  --   --  7.8  ALBUMIN 2.4* 2.2* 2.3* 2.3* 2.9*   Recent Labs    02/27/19 0000 05/31/19 1319 06/06/19 0000 07/27/19 1529 08/16/19 1818  WBC  --  10.3 11.5 18.6* 6.9  NEUTROABS  --   --  8 15.5* 4.4  HGB   < > 7.6* 9.3* 12.4* 11.0*  HCT   < > 24.7* 29* 40.2 34.8*  MCV  --  88.5  --  89.3 89.5  PLT   < > 220 251 171 193   < > = values in this interval not displayed.   Lab Results  Component Value Date   TSH 1.839 05/25/2019   Lab Results  Component Value Date   HGBA1C 4.7 09/08/2019   Lab Results  Component Value Date   CHOL 113 11/11/2018   HDL 39 (  L) 11/11/2018   LDLCALC 62 11/11/2018   TRIG 60 11/11/2018   CHOLHDL 2.9 11/11/2018    Significant Diagnostic Results in last 30 days:  No results found.  Assessment/Plan There are no diagnoses linked to this encounter.   Family/ staff Communication: ***  Labs/tests ordered:  ***

## 2019-12-31 ENCOUNTER — Encounter: Payer: Self-pay | Admitting: Internal Medicine

## 2020-01-01 ENCOUNTER — Encounter (HOSPITAL_COMMUNITY): Payer: Self-pay | Admitting: Vascular Surgery

## 2020-01-01 NOTE — Progress Notes (Addendum)
Anesthesia Chart Review: [name]    Case: [id number] Date/Time: 01/02/20 0715   Procedures:      REVISON OF LEFT ARM ARTERIOVENOUS FISTULA (Left )     INSERTION OF DIALYSIS CATHETER (N/A )   Anesthesia type: Monitor Anesthesia Care   Pre-op diagnosis: END STAGE RENAL DISEASE ON DIALYSIS   Location: [facility] OR ROOM 10 / [place] OR   Surgeons: Elam Dutch, MD      DISCUSSION: Patient is a 79 year old male scheduled for the above procedure.  He resides at [facility] facility. He is a dialysis patient and has a ulceration on his left arm AVF.   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   Multiple ED visits and/or hospitalizations including:   - ED visits 08/16/19 and 07/17/19 for AMS and for hypotension, respectively.  - Hospitalized 05/25/19-05/31/19 and 02/15/19-02/21/19 for with sepsis and concern of right AKA site infection. Treated with IV Ancef in later admission, but s/p I&D, wound VAC of right AKA site 02/17/19. - Hospitalized 12/23/18-01/03/19 for diabetic wet gangrene of the foot. Had Palliative care consult. 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 - Hospitalized 12/01/18-12/04/28 for symptomatic anemia. No dark stools. GI did not recommended repeat colonoscopy. S/p PRBC. Discharged on iron, PPI, Hectoral, phosphate binder.   - Hospitalized 11/06/18-11/17/18 for acute hypoxic respiratory failure due to volume overload. Treated with hemodialysis. Continued BiPAP for OSA. 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 Treated with PRBC for anemia. Underwent LLE percutaneous revascularization intervention. Echo  showed LVEF 60-65%, severely reduced AV cusp separation with moderate stenosis (gradients may be underestimated), PA peak pressure 67 mmHg, consider out-patient cardiology consultation per 11/09/18 hospitalist note.     Last evaluated by cardiologist Dr. Ellyn Hack in March 2018. His last echo was 10/2018 during admission as above, but does not appear that he ever had out-patient cardiology follow-up after that. Reviewed above and 2019 echo results with anesthesiologist Annye Asa, MD. Patient may require general anesthesia for surgery.  Given progression of his AS from 2018 and 2019 and AS not reassessed in > 1 year, would recommend preoperative echocardiogram to re-evaluate AS. Dr. Glennon Mac discussed with Dr. Oneida Alar. Patient to arrive at 5:30 AM as scheduled with plans for bedside echo with surgery delayed until 9:00 AM. I spoke with Melissa in the Echo Department. She will arrange for staff to be in [place] by 7:30 AM and has requested STAT read (will likely be [name], MD).   He is for same day labs and COVID-19 testing. [facility] notes indicate that he received the Moderna COVID-19 vaccine on 12/28/19.   ADDENDUM 01/02/20 4:59 PM: Echo 01/02/20 showed: IMPRESSIONS  1. Severe calcification of the aortic valve with severely restricted leaflet motion. Moderate aortic stenosis with gradient 21 mmHg. Aortic valve regurgitation is trivial.  2. Left ventricular ejection fraction, by visual estimation, is 65 to 70%. The left ventricle has hyperdynamic function. There is mildly increased left ventricular hypertrophy.  3. Elevated left ventricular end-diastolic pressure.  4. Global right ventricle has mildly reduced systolic function.The right ventricular size is normal. No increase in right ventricular wall thickness.  5. Severely  elevated pulmonary artery systolic pressure.  6. The tricuspid regurgitant velocity is 3.53 m/s, and with an assumed right atrial pressure of 15 mmHg, the  estimated right ventricular systolic pressure is severely elevated at 64.8 mmHg.  7. Left atrial size was moderately dilated.  8. Right atrial size was mildly dilated.  9. Mild mitral annular calcification. 10. The mitral valve is normal in structure. Trivial mitral valve regurgitation. No evidence of mitral stenosis. 11. The tricuspid valve is normal in structure. Mild-moderate TR. 12. The pulmonic valve was normal in structure. Pulmonic valve regurgitation is trivial. 13. The inferior vena cava is dilated in size with <50% respiratory variability, suggesting right atrial pressure of 15 mmHg.  I have routed these results to Hennie Duos, MD and to Granville Lewis, PA-C for follow-up purposes. If PCP wants him to see cardiology in the near future then I've asked that they arrange.    PROVIDERS: Hennie Duos, MD is PCP. Last visit 12/28/19 with Granville Lewis, PA-C for afib and anticoagulation follow-up. Warfarin on hold for surgery (12/29/19). Antony Contras, MD is neurologist. Last evaluation 01/11/19. 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 12 month follow-up had been planned.  - Nephrologist is with [employer]. Goes to [employer].   LABS: He is for labs on the day of surgery. A1c 09/08/19 was 4.7%.   IMAGES: 1V CXR 08/16/19: FINDINGS: Lung volumes are low with crowding of the bronchovascular structures. Heart size is mildly enlarged and there is pulmonary vascular congestion. No consolidative process, pneumothorax or effusion. Atherosclerosis noted. No acute or focal bony abnormality. IMPRESSION: Pulmonary vascular congestion. Atherosclerosis.   EKG: 08/16/19: Atrial fibrillation at 94 bpm Ventricular premature complex Abnormal lateral Q waves Anterior infarct, old Confirmed by Veryl Speak [phone] on  08/16/2019 6:36:36 PM   CV: Echo 11/08/18: Study Conclusions - Left ventricle: The cavity size was mildly reduced. There was   mild concentric hypertrophy. Systolic function was normal. The   estimated ejection fraction was in the range of 60% to 65%. Wall   motion was normal; there were no regional wall motion   abnormalities. - Aortic valve: Cusp separation was severely reduced. Immobile left   coronary cusp. Right coronary and noncoronary cusp mobility was   moderately restricted. Transvalvular velocity was increased less   than expected. There was moderate stenosis. By planimetry the   valve area is 0.9 cm sq. Valve area (VTI): 0.78 cm^2. Valve area   (Vmean): 0.92 cm^2. By planimetry the valve area is 0.9 cm sq.    VTI ratio of LVOT to aortic valve: 0.28. Valve area (VTI): 0.78 cm^2. Indexed valve area (VTI): 0.43 cm^2/m^2. Mean velocity ratio of LVOT to aortic valve: 0.33. Valve area (Vmean): 0.92 cm^2. Indexed valve area (Vmean): 0.5 cm^2/m^2.    Mean gradient (S): 16 mm Hg. - Mitral valve: Calcified annulus. Mildly thickened leaflets . - Left atrium: The atrium was moderately dilated. - Right ventricle: The cavity size was severely dilated. Systolic   function was moderately to severely reduced. - Right atrium: The atrium was moderately dilated. - Tricuspid valve: There was mild-moderate regurgitation directed   centrally. - Pulmonary arteries: PA peak pressure: 67 mm Hg (S). Impressions: - Suspect aortic valve gradients are underestimated. The patient   did not fully cooperate with the exam. In addition gradients may   be lower  than expected due to limited cardiac output secondary to   right ventricular failure. (Comparison echo 02/15/17: EF 60-65%, mild AS, PA peak pressure 69 mmHg)   Carotid US 11/13/18: Summary: Right Carotid: Velocities in the right ICA are consistent with a 1-39% stenosis. Left Carotid: Velocities in the left ICA are consistent with a 1-39%  stenosis. Vertebrals:  Bilateral vertebral arteries demonstrate antegrade flow. Subclavians: Normal flow hemodynamics were seen in bilateral subclavian              arteries.   Nuclear stress test 03/02/11: IMPRESSION: Normal exam. No significant interval change.   Past Medical History:  Diagnosis Date  . A-fib ([place])   . Anemia   . Blood transfusion   . BPH (benign prostatic hyperplasia)   . CHF (congestive heart failure) ([place])   . Diarrhea   . DM (diabetes mellitus) ([place])    Type II-   not on medication   . ESRD on hemodialysis ([place])    Started dialysis in 2009  . History of GI bleed    secondary to coumadin  . HTN (hypertension)   . Hyperlipidemia   . OSA (obstructive sleep apnea)    uses CPAP  . Sacral wound    stage II - healed, has a protective dressing on- 12/28/2019  . Secondary hyperparathyroidism of renal origin [facility])     Past Surgical History:  Procedure Laterality Date  . ABDOMINAL AORTOGRAM N/A 11/15/2018   Procedure: ABDOMINAL AORTOGRAM;  Surgeon: [name], MD;  Location: [facility];  Service: Cardiovascular;  Laterality: N/A;  . AMPUTATION Left 12/06/2018   Procedure: AMPUTATION DIGIT LEFT FIFTH TOE;  Surgeon: Angelia Mould, MD;  Location: [facility];  Service: Vascular;  Laterality: Left;  . AMPUTATION Bilateral 12/29/2018   Procedure: AMPUTATION ABOVE KNEE;  Surgeon: Waynetta Sandy, MD;  Location: [facility];  Service: Vascular;  Laterality: Bilateral;  . APPLICATION OF WOUND VAC Right 02/17/2019   Procedure: Application Of Wound Vac;  Surgeon: Marty Heck, MD;  Location: [facility];  Service: Vascular;  Laterality: Right;  . BVT  123456   Left  Basilic Vein Transposition  . CHOLECYSTECTOMY    . EYE SURGERY     Catarct bil  . I & D EXTREMITY Right 02/17/2019   Procedure: Right above the kneee debridement;  Surgeon: Marty Heck, MD;  Location: [facility];  Service: Vascular;  Laterality: Right;  . INSERTION OF DIALYSIS  CATHETER  05/28/2012   Procedure: INSERTION OF DIALYSIS CATHETER;  Surgeon: Mal Misty, MD;  Location: [facility];  Service: Vascular;  Laterality: Right;  . Left arm shuntogram.    . Left forearm loop graft with 6 mm Gore-Tex graft.    . LOWER EXTREMITY ANGIOGRAPHY Bilateral 11/15/2018   Procedure: LOWER EXTREMITY ANGIOGRAPHY;  Surgeon: [name], MD;  Location: [place] [facility] [facility];  Service: Cardiovascular;  Laterality: Bilateral;  . Pars plana vitrectomy with 25-gauge system    . PERIPHERAL VASCULAR ATHERECTOMY Left 11/15/2018   Procedure: PERIPHERAL VASCULAR ATHERECTOMY;  Surgeon: [name], MD;  Location: [facility];  Service: Cardiovascular;  Laterality: Left;  SFA with STENT  . PERIPHERAL VASCULAR BALLOON ANGIOPLASTY Left 11/15/2018   Procedure: PERIPHERAL VASCULAR BALLOON ANGIOPLASTY;  Surgeon: [name], MD;  Location: [facility];  Service: Cardiovascular;  Laterality: Left;  PT    MEDICATIONS: No current facility-administered medications for this encounter.   Marland Kitchen acetaminophen (TYLENOL) 500 MG tablet  .  Amino Acids-Protein Hydrolys (FEEDING SUPPLEMENT, PRO-STAT SUGAR FREE 64,) LIQD  . ascorbic acid (VITAMIN C) 500 MG tablet  . atorvastatin (LIPITOR) 20 MG tablet  . Cholecalciferol (VITAMIN D3) 1.25 MG (50000 UT) CAPS  . dicyclomine (BENTYL) 20 MG tablet  . divalproex (DEPAKOTE SPRINKLE) 125 MG capsule  . HYDROcodone-acetaminophen (NORCO) 5-325 MG tablet  . loperamide (IMODIUM A-D) 2 MG tablet  . metoprolol tartrate (LOPRESSOR) 25 MG tablet  . midodrine (PROAMATINE) 10 MG tablet  . multivitamin (RENA-VIT) TABS tablet  . Nutritional Supplements (ADULT NUTRITIONAL SUPPLEMENT PO)  . Nutritional Supplements (NEPRO PO)  . pantoprazole (PROTONIX) 40 MG tablet  . sevelamer carbonate (RENVELA) 800 MG tablet  . warfarin (COUMADIN) 2.5 MG tablet  . zinc sulfate 220 (50 Zn) MG capsule  . divalproex (DEPAKOTE SPRINKLE) 125 MG capsule  . doxercalciferol  (HECTOROL) 4 MCG/2ML injection    Myra Gianotti, PA-C Surgical Short Stay/Anesthesiology [facility] Phone [phone] [facility] Phone [phone] 01/01/2020 2:42 PM

## 2020-01-01 NOTE — Anesthesia Preprocedure Evaluation (Addendum)
Anesthesia Evaluation  Patient identified by MRN, date of birth, ID bandGeneral Assessment Comment:Awake, poor historian   Reviewed: Allergy & Precautions, NPO status , Patient's Chart, lab work & pertinent test results, reviewed documented beta blocker date and time   History of Anesthesia Complications Negative for: history of anesthetic complications  Airway Mallampati: II  TM Distance: >3 FB Neck ROM: Full    Dental  (+) Dental Advisory Given, Partial Upper, Missing   Pulmonary sleep apnea , former smoker,    Pulmonary exam normal        Cardiovascular hypertension, Pt. on home beta blockers and Pt. on medications + Peripheral Vascular Disease  + dysrhythmias Atrial Fibrillation + Valvular Problems/Murmurs AS  Rhythm:Regular Rate:Normal + Systolic murmurs  '21 TTE - Severe calcification of the aortic valve with severely restricted leaflet motion. Moderate AS with gradient 21 mmHg. Trivial AI. EF 65 to 70%. Mildly increased left ventricular hypertrophy. Global right ventricle has mildly reduced systolic function. Severely elevated pulmonary artery systolic pressure.The tricuspid regurgitant velocity is 3.53 m/s, and with an assumed right atrial pressure of 15 mmHg, the estimated right ventricular systolic pressure is severely elevated at 64.8 mmHg. LA was moderately dilated. RA was mildly dilated.  Trace MR. Mild-moderate TR. Trivial PR.  '19 Carotid US - 1-39% b/l ICAS    Neuro/Psych PSYCHIATRIC DISORDERS CVA    GI/Hepatic Neg liver ROS, GERD  Medicated,  Endo/Other  diabetes  Renal/GU ESRF and DialysisRenal disease     Musculoskeletal negative musculoskeletal ROS (+)   Abdominal   Peds  Hematology negative hematology ROS (+)   Anesthesia Other Findings Covid neg 01/02/20   Reproductive/Obstetrics                           Anesthesia Physical Anesthesia Plan  ASA: IV  Anesthesia Plan:  MAC   Post-op Pain Management:    Induction: Intravenous  PONV Risk Score and Plan: 1 and Treatment may vary due to age or medical condition and Propofol infusion  Airway Management Planned: Natural Airway and Simple Face Mask  Additional Equipment: None  Intra-op Plan:   Post-operative Plan:   Informed Consent: I have reviewed the patients History and Physical, chart, labs and discussed the procedure including the risks, benefits and alternatives for the proposed anesthesia with the patient or authorized representative who has indicated his/her understanding and acceptance.       Plan Discussed with: CRNA and Anesthesiologist  Anesthesia Plan Comments:      Anesthesia Quick Evaluation

## 2020-01-02 ENCOUNTER — Ambulatory Visit (HOSPITAL_COMMUNITY): Payer: Medicare Other | Admitting: Vascular Surgery

## 2020-01-02 ENCOUNTER — Ambulatory Visit (HOSPITAL_COMMUNITY)
Admission: RE | Admit: 2020-01-02 | Discharge: 2020-01-02 | Disposition: A | Discharge status: Rehabilitation Facility | Payer: Medicare Other | Attending: Vascular Surgery | Admitting: Vascular Surgery

## 2020-01-02 ENCOUNTER — Ambulatory Visit (HOSPITAL_COMMUNITY): Payer: Medicare Other

## 2020-01-02 ENCOUNTER — Encounter (HOSPITAL_COMMUNITY)
Admission: RE | Disposition: A | Discharge status: Rehabilitation Facility | Payer: Self-pay | Source: Home / Self Care | Attending: Vascular Surgery

## 2020-01-02 ENCOUNTER — Other Ambulatory Visit: Payer: Self-pay

## 2020-01-02 ENCOUNTER — Ambulatory Visit (HOSPITAL_BASED_OUTPATIENT_CLINIC_OR_DEPARTMENT_OTHER): Payer: Medicare Other

## 2020-01-02 ENCOUNTER — Encounter (HOSPITAL_COMMUNITY): Payer: Self-pay | Admitting: Vascular Surgery

## 2020-01-02 DIAGNOSIS — I4891 Unspecified atrial fibrillation: Secondary | ICD-10-CM | POA: Diagnosis not present

## 2020-01-02 DIAGNOSIS — Z20822 Contact with and (suspected) exposure to covid-19: Secondary | ICD-10-CM | POA: Insufficient documentation

## 2020-01-02 DIAGNOSIS — Z7901 Long term (current) use of anticoagulants: Secondary | ICD-10-CM | POA: Diagnosis not present

## 2020-01-02 DIAGNOSIS — Z89611 Acquired absence of right leg above knee: Secondary | ICD-10-CM | POA: Diagnosis not present

## 2020-01-02 DIAGNOSIS — Y841 Kidney dialysis as the cause of abnormal reaction of the patient, or of later complication, without mention of misadventure at the time of the procedure: Secondary | ICD-10-CM | POA: Insufficient documentation

## 2020-01-02 DIAGNOSIS — N400 Enlarged prostate without lower urinary tract symptoms: Secondary | ICD-10-CM | POA: Diagnosis not present

## 2020-01-02 DIAGNOSIS — Z992 Dependence on renal dialysis: Secondary | ICD-10-CM | POA: Insufficient documentation

## 2020-01-02 DIAGNOSIS — I350 Nonrheumatic aortic (valve) stenosis: Secondary | ICD-10-CM

## 2020-01-02 DIAGNOSIS — E785 Hyperlipidemia, unspecified: Secondary | ICD-10-CM | POA: Diagnosis not present

## 2020-01-02 DIAGNOSIS — N2581 Secondary hyperparathyroidism of renal origin: Secondary | ICD-10-CM | POA: Diagnosis not present

## 2020-01-02 DIAGNOSIS — T82510A Breakdown (mechanical) of surgically created arteriovenous fistula, initial encounter: Secondary | ICD-10-CM | POA: Insufficient documentation

## 2020-01-02 DIAGNOSIS — I509 Heart failure, unspecified: Secondary | ICD-10-CM | POA: Diagnosis not present

## 2020-01-02 DIAGNOSIS — Z8249 Family history of ischemic heart disease and other diseases of the circulatory system: Secondary | ICD-10-CM | POA: Diagnosis not present

## 2020-01-02 DIAGNOSIS — Z882 Allergy status to sulfonamides status: Secondary | ICD-10-CM | POA: Diagnosis not present

## 2020-01-02 DIAGNOSIS — Z87891 Personal history of nicotine dependence: Secondary | ICD-10-CM | POA: Insufficient documentation

## 2020-01-02 DIAGNOSIS — Z95828 Presence of other vascular implants and grafts: Secondary | ICD-10-CM

## 2020-01-02 DIAGNOSIS — I361 Nonrheumatic tricuspid (valve) insufficiency: Secondary | ICD-10-CM

## 2020-01-02 DIAGNOSIS — N186 End stage renal disease: Secondary | ICD-10-CM

## 2020-01-02 DIAGNOSIS — Z79899 Other long term (current) drug therapy: Secondary | ICD-10-CM | POA: Insufficient documentation

## 2020-01-02 DIAGNOSIS — Z89612 Acquired absence of left leg above knee: Secondary | ICD-10-CM | POA: Insufficient documentation

## 2020-01-02 DIAGNOSIS — E1122 Type 2 diabetes mellitus with diabetic chronic kidney disease: Secondary | ICD-10-CM | POA: Insufficient documentation

## 2020-01-02 DIAGNOSIS — G4733 Obstructive sleep apnea (adult) (pediatric): Secondary | ICD-10-CM | POA: Diagnosis not present

## 2020-01-02 DIAGNOSIS — I132 Hypertensive heart and chronic kidney disease with heart failure and with stage 5 chronic kidney disease, or end stage renal disease: Secondary | ICD-10-CM | POA: Insufficient documentation

## 2020-01-02 DIAGNOSIS — Z888 Allergy status to other drugs, medicaments and biological substances status: Secondary | ICD-10-CM | POA: Diagnosis not present

## 2020-01-02 DIAGNOSIS — Z419 Encounter for procedure for purposes other than remedying health state, unspecified: Secondary | ICD-10-CM

## 2020-01-02 HISTORY — DX: Unspecified open wound of lower back and pelvis without penetration into retroperitoneum, initial encounter: S31.000A

## 2020-01-02 HISTORY — PX: REVISON OF ARTERIOVENOUS FISTULA: SHX6074

## 2020-01-02 HISTORY — PX: INSERTION OF DIALYSIS CATHETER: SHX1324

## 2020-01-02 HISTORY — DX: Nonrheumatic aortic (valve) stenosis: I35.0

## 2020-01-02 LAB — RESPIRATORY PANEL BY RT PCR (FLU A&B, COVID)
Influenza A by PCR: NEGATIVE
Influenza B by PCR: NEGATIVE
SARS Coronavirus 2 by RT PCR: NEGATIVE

## 2020-01-02 LAB — POCT I-STAT, CHEM 8
BUN: 28 mg/dL — ABNORMAL HIGH (ref 8–23)
Calcium, Ion: 1.14 mmol/L — ABNORMAL LOW (ref 1.15–1.40)
Chloride: 95 mmol/L — ABNORMAL LOW (ref 98–111)
Creatinine, Ser: 3.8 mg/dL — ABNORMAL HIGH (ref 0.61–1.24)
Glucose, Bld: 120 mg/dL — ABNORMAL HIGH (ref 70–99)
HCT: 35 % — ABNORMAL LOW (ref 39.0–52.0)
Hemoglobin: 11.9 g/dL — ABNORMAL LOW (ref 13.0–17.0)
Potassium: 3.6 mmol/L (ref 3.5–5.1)
Sodium: 140 mmol/L (ref 135–145)
TCO2: 34 mmol/L — ABNORMAL HIGH (ref 22–32)

## 2020-01-02 LAB — SURGICAL PCR SCREEN
MRSA, PCR: NEGATIVE
Staphylococcus aureus: POSITIVE — AB

## 2020-01-02 LAB — PROTIME-INR
INR: 1.3 — ABNORMAL HIGH (ref 0.8–1.2)
Prothrombin Time: 16.3 seconds — ABNORMAL HIGH (ref 11.4–15.2)

## 2020-01-02 LAB — GLUCOSE, CAPILLARY
Glucose-Capillary: 83 mg/dL (ref 70–99)
Glucose-Capillary: 74 mg/dL (ref 70–99)
Glucose-Capillary: 117 mg/dL — ABNORMAL HIGH (ref 70–99)
Glucose-Capillary: 129 mg/dL — ABNORMAL HIGH (ref 70–99)
Glucose-Capillary: 63 mg/dL — ABNORMAL LOW (ref 70–99)

## 2020-01-02 LAB — ECHOCARDIOGRAM COMPLETE: Weight: 1883.61 oz

## 2020-01-02 SURGERY — REVISON OF ARTERIOVENOUS FISTULA
Anesthesia: Monitor Anesthesia Care | Laterality: Right

## 2020-01-02 MED ORDER — MUPIROCIN 2 % EX OINT
TOPICAL_OINTMENT | CUTANEOUS | Status: AC
  Administered 2020-01-02: 1 via TOPICAL
  Filled 2020-01-02: qty 22

## 2020-01-02 MED ORDER — SODIUM CHLORIDE 0.9 % IV SOLN
INTRAVENOUS | Status: AC
  Filled 2020-01-02: qty 1.2

## 2020-01-02 MED ORDER — HYDROCODONE-ACETAMINOPHEN 5-325 MG PO TABS: 1.0000 | ORAL_TABLET | Freq: Four times a day (QID) | ORAL | Status: DC | PRN

## 2020-01-02 MED ORDER — SODIUM CHLORIDE 0.9 % IV SOLN: INTRAVENOUS | Status: DC

## 2020-01-02 MED ORDER — LIDOCAINE HCL (PF) 1 % IJ SOLN
INTRAMUSCULAR | Status: AC
  Filled 2020-01-02: qty 30

## 2020-01-02 MED ORDER — HEPARIN SODIUM (PORCINE) 1000 UNIT/ML IJ SOLN
INTRAMUSCULAR | Status: AC
  Filled 2020-01-02: qty 1

## 2020-01-02 MED ORDER — HEPARIN SODIUM (PORCINE) 1000 UNIT/ML IJ SOLN
INTRAMUSCULAR | Status: DC | PRN
  Administered 2020-01-02: 3400 [IU]

## 2020-01-02 MED ORDER — ONDANSETRON HCL 4 MG/2ML IJ SOLN: 4.0000 mg | Freq: Once | INTRAMUSCULAR | Status: DC | PRN

## 2020-01-02 MED ORDER — PROTAMINE SULFATE 10 MG/ML IV SOLN
INTRAVENOUS | Status: DC | PRN
  Administered 2020-01-02: 50 mg via INTRAVENOUS

## 2020-01-02 MED ORDER — 0.9 % SODIUM CHLORIDE (POUR BTL) OPTIME
TOPICAL | Status: DC | PRN
  Administered 2020-01-02: 1000 mL

## 2020-01-02 MED ORDER — HYDROCODONE-ACETAMINOPHEN 5-325 MG PO TABS
ORAL_TABLET | ORAL | Status: AC
  Filled 2020-01-02: qty 1

## 2020-01-02 MED ORDER — FENTANYL CITRATE (PF) 250 MCG/5ML IJ SOLN
INTRAMUSCULAR | Status: AC
  Filled 2020-01-02: qty 5

## 2020-01-02 MED ORDER — PROPOFOL 10 MG/ML IV BOLUS
INTRAVENOUS | Status: AC
  Filled 2020-01-02: qty 20

## 2020-01-02 MED ORDER — HYDROCODONE-ACETAMINOPHEN 5-325 MG PO TABS
1.0000 | ORAL_TABLET | Freq: Four times a day (QID) | ORAL | Status: DC | PRN
  Administered 2020-01-02: 1 via ORAL

## 2020-01-02 MED ORDER — HEPARIN SODIUM (PORCINE) 1000 UNIT/ML IJ SOLN
INTRAMUSCULAR | Status: DC | PRN
  Administered 2020-01-02: 5000 [IU] via INTRAVENOUS

## 2020-01-02 MED ORDER — CHLORHEXIDINE GLUCONATE 4 % EX LIQD: 60.0000 mL | Freq: Once | CUTANEOUS | Status: DC

## 2020-01-02 MED ORDER — DEXTROSE 50 % IV SOLN
12.5000 g | INTRAVENOUS | Status: AC
  Administered 2020-01-02: 12.5 g via INTRAVENOUS

## 2020-01-02 MED ORDER — SODIUM CHLORIDE 0.9 % IV SOLN
INTRAVENOUS | Status: DC | PRN
  Administered 2020-01-02: 500 mL

## 2020-01-02 MED ORDER — LIDOCAINE HCL (PF) 1 % IJ SOLN
INTRAMUSCULAR | Status: DC | PRN
  Administered 2020-01-02: 20 mL
  Administered 2020-01-02: 19 mL

## 2020-01-02 MED ORDER — OXYCODONE HCL 5 MG/5ML PO SOLN: 5.0000 mg | Freq: Once | ORAL | Status: DC | PRN

## 2020-01-02 MED ORDER — DEXTROSE 50 % IV SOLN
INTRAVENOUS | Status: AC
  Administered 2020-01-02: 25 mL via INTRAVENOUS
  Filled 2020-01-02: qty 50

## 2020-01-02 MED ORDER — HYDROCODONE-ACETAMINOPHEN 5-325 MG PO TABS: 1.0000 | ORAL_TABLET | Freq: Four times a day (QID) | ORAL | 0 refills | Status: DC | PRN

## 2020-01-02 MED ORDER — PROPOFOL 500 MG/50ML IV EMUL
INTRAVENOUS | Status: DC | PRN
  Administered 2020-01-02: 20 ug/kg/min via INTRAVENOUS

## 2020-01-02 MED ORDER — CEFAZOLIN SODIUM-DEXTROSE 2-4 GM/100ML-% IV SOLN
2.0000 g | INTRAVENOUS | Status: AC
  Administered 2020-01-02: 2 g via INTRAVENOUS
  Filled 2020-01-02: qty 100

## 2020-01-02 MED ORDER — DEXTROSE 50 % IV SOLN: 25.0000 mL | Freq: Once | INTRAVENOUS | Status: AC

## 2020-01-02 MED ORDER — FENTANYL CITRATE (PF) 100 MCG/2ML IJ SOLN: 25.0000 ug | INTRAMUSCULAR | Status: DC | PRN

## 2020-01-02 MED ORDER — OXYCODONE HCL 5 MG PO TABS: 5.0000 mg | ORAL_TABLET | Freq: Once | ORAL | Status: DC | PRN

## 2020-01-02 MED ORDER — MUPIROCIN 2 % EX OINT: 1.0000 "application " | TOPICAL_OINTMENT | Freq: Once | CUTANEOUS | Status: AC

## 2020-01-02 MED ORDER — DEXTROSE 50 % IV SOLN
INTRAVENOUS | Status: AC
  Filled 2020-01-02: qty 50

## 2020-01-02 SURGICAL SUPPLY — 63 items
ADH SKN CLS APL DERMABOND .7 (GAUZE/BANDAGES/DRESSINGS) ×4
AGENT HMST SPONGE THK3/8 (HEMOSTASIS)
APL PRP STRL LF DISP 70% ISPRP (MISCELLANEOUS) ×2
ARMBAND PINK RESTRICT EXTREMIT (MISCELLANEOUS) ×3 IMPLANT
BAG DECANTER FOR FLEXI CONT (MISCELLANEOUS) ×2 IMPLANT
BIOPATCH RED 1 DISK 7.0 (GAUZE/BANDAGES/DRESSINGS) ×3 IMPLANT
CANISTER SUCT 3000ML PPV (MISCELLANEOUS) ×3 IMPLANT
CANNULA VESSEL 3MM 2 BLNT TIP (CANNULA) ×3 IMPLANT
CATH PALINDROME RT-P 15FX19CM (CATHETERS) IMPLANT
CATH PALINDROME RT-P 15FX23CM (CATHETERS) ×1 IMPLANT
CATH PALINDROME RT-P 15FX28CM (CATHETERS) IMPLANT
CATH PALINDROME RT-P 15FX55CM (CATHETERS) IMPLANT
CATH STRAIGHT 5FR 65CM (CATHETERS) IMPLANT
CHLORAPREP W/TINT 26 (MISCELLANEOUS) ×3 IMPLANT
CLIP VESOCCLUDE MED 6/CT (CLIP) ×3 IMPLANT
CLIP VESOCCLUDE SM WIDE 6/CT (CLIP) ×3 IMPLANT
COVER PROBE W GEL 5X96 (DRAPES) ×1 IMPLANT
COVER SURGICAL LIGHT HANDLE (MISCELLANEOUS) ×3 IMPLANT
COVER WAND RF STERILE (DRAPES) ×2 IMPLANT
DECANTER SPIKE VIAL GLASS SM (MISCELLANEOUS) ×2 IMPLANT
DERMABOND ADVANCED (GAUZE/BANDAGES/DRESSINGS) ×2
DERMABOND ADVANCED .7 DNX12 (GAUZE/BANDAGES/DRESSINGS) ×2 IMPLANT
DRAIN PENROSE 1/4X12 LTX STRL (WOUND CARE) ×3 IMPLANT
DRAPE C-ARM 42X72 X-RAY (DRAPES) ×3 IMPLANT
DRAPE CHEST BREAST 15X10 FENES (DRAPES) ×3 IMPLANT
DRSG COVADERM 4X6 (GAUZE/BANDAGES/DRESSINGS) ×1 IMPLANT
ELECT REM PT RETURN 9FT ADLT (ELECTROSURGICAL) ×3
ELECTRODE REM PT RTRN 9FT ADLT (ELECTROSURGICAL) ×2 IMPLANT
GAUZE 4X4 16PLY RFD (DISPOSABLE) ×2 IMPLANT
GLOVE BIO SURGEON STRL SZ7.5 (GLOVE) ×4 IMPLANT
GOWN STRL REUS W/ TWL LRG LVL3 (GOWN DISPOSABLE) ×6 IMPLANT
GOWN STRL REUS W/TWL LRG LVL3 (GOWN DISPOSABLE) ×12
HEMOSTAT SPONGE AVITENE ULTRA (HEMOSTASIS) IMPLANT
KIT BASIN OR (CUSTOM PROCEDURE TRAY) ×3 IMPLANT
KIT TURNOVER KIT B (KITS) ×3 IMPLANT
LOOP VESSEL MINI RED (MISCELLANEOUS) IMPLANT
NDL 18GX1X1/2 (RX/OR ONLY) (NEEDLE) ×2 IMPLANT
NDL HYPO 25GX1X1/2 BEV (NEEDLE) ×2 IMPLANT
NEEDLE 18GX1X1/2 (RX/OR ONLY) (NEEDLE) ×3 IMPLANT
NEEDLE HYPO 25GX1X1/2 BEV (NEEDLE) ×3 IMPLANT
NS IRRIG 1000ML POUR BTL (IV SOLUTION) ×3 IMPLANT
PACK CV ACCESS (CUSTOM PROCEDURE TRAY) ×3 IMPLANT
PACK SURGICAL SETUP 50X90 (CUSTOM PROCEDURE TRAY) ×3 IMPLANT
PAD ARMBOARD 7.5X6 YLW CONV (MISCELLANEOUS) ×6 IMPLANT
SET MICROPUNCTURE 5F STIFF (MISCELLANEOUS) IMPLANT
SUT ETHILON 3 0 PS 1 (SUTURE) ×3 IMPLANT
SUT PROLENE 5 0 C 1 24 (SUTURE) ×2 IMPLANT
SUT PROLENE 6 0 CC (SUTURE) ×2 IMPLANT
SUT PROLENE 7 0 BV 1 (SUTURE) IMPLANT
SUT VIC AB 3-0 SH 27 (SUTURE) ×3
SUT VIC AB 3-0 SH 27X BRD (SUTURE) ×2 IMPLANT
SUT VIC AB 4-0 PS2 18 (SUTURE) ×1 IMPLANT
SUT VICRYL 4-0 PS2 18IN ABS (SUTURE) ×3 IMPLANT
SYR 10ML LL (SYRINGE) ×3 IMPLANT
SYR 20ML LL LF (SYRINGE) ×6 IMPLANT
SYR 5ML LL (SYRINGE) ×3 IMPLANT
SYR CONTROL 10ML LL (SYRINGE) ×3 IMPLANT
TOWEL GREEN STERILE (TOWEL DISPOSABLE) ×5 IMPLANT
TOWEL GREEN STERILE FF (TOWEL DISPOSABLE) ×3 IMPLANT
UNDERPAD 30X30 (UNDERPADS AND DIAPERS) ×3 IMPLANT
WATER STERILE IRR 1000ML POUR (IV SOLUTION) ×3 IMPLANT
WIRE AMPLATZ SS-J .035X180CM (WIRE) IMPLANT
WIRE BENTSON .035X145CM (WIRE) ×1 IMPLANT

## 2020-01-02 NOTE — Progress Notes (Signed)
  Echocardiogram 2D Echocardiogram has been performed.  [name] 01/02/2020, 7:57 AM

## 2020-01-02 NOTE — Transfer of Care (Signed)
Immediate Anesthesia Transfer of Care Note  Patient: [name]  Procedure(s) Performed: REVISON OF LEFT ARM ARTERIOVENOUS FISTULA (Left ) INSERTION OF DIALYSIS CATHETER RIGHT INTERNAL JUGULAR (Right )  Patient Location: PACU  Anesthesia Type:MAC  Level of Consciousness: drowsy and patient cooperative  Airway & Oxygen Therapy: Patient Spontanous Breathing  Post-op Assessment: Report given to RN and Post -op Vital signs reviewed and stable  Post vital signs: Reviewed and stable  Last Vitals:  Vitals Value Taken Time  BP 108/51 01/02/20 1237  Temp    Pulse 63 01/02/20 1238  Resp 16 01/02/20 1238  SpO2 99 % 01/02/20 1238  Vitals shown include unvalidated device data.  Last Pain:  Vitals:   01/02/20 0712  PainSc: 0-No pain         Complications: No apparent anesthesia complications

## 2020-01-02 NOTE — Addendum Note (Signed)
Addendum  created 01/02/20 1828 by Jacinta Shoe, PA-C   Clinical Note Signed

## 2020-01-02 NOTE — Progress Notes (Signed)
Elevated K 8.5. Verbal orders from Dr. Fransisco Beau for repeat I stat

## 2020-01-02 NOTE — Progress Notes (Signed)
CBG 74, Verbal orders given by Dr, Fransisco Beau.  No, c/o feeling faint, dizzy, or blurry vision. Will continue to monitor patient.

## 2020-01-02 NOTE — Anesthesia Procedure Notes (Signed)
Procedure Name: MAC Date/Time: 01/02/2020 10:19 AM Performed by: Janace Litten, CRNA Pre-anesthesia Checklist: Patient identified, Emergency Drugs available, Suction available and Patient being monitored Patient Re-evaluated:Patient Re-evaluated prior to induction Oxygen Delivery Method: Simple face mask

## 2020-01-02 NOTE — Discharge Instructions (Signed)
[facility]  Discharge Instructions  AV Fistula or Graft Surgery for Dialysis Access  Please refer to the following instructions for your post-procedure care. Your surgeon or physician assistant will discuss any changes with you.  Activity  You may drive the day following your surgery, if you are comfortable and no longer taking prescription pain medication. Resume full activity as the soreness in your incision resolves.  Bathing/Showering  You may shower after you go home. Keep your incision dry for 48 hours. Do not soak in a bathtub, hot tub, or swim until the incision heals completely. You may not shower if you have a hemodialysis catheter.  Incision Care  Clean your incision with mild soap and water after 48 hours. Pat the area dry with a clean towel. You do not need a bandage unless otherwise instructed. Do not apply any ointments or creams to your incision. You may have skin glue on your incision. Do not peel it off. It will come off on its own in about one week. Your arm may swell a bit after surgery. To reduce swelling use pillows to elevate your arm so it is above your heart. Your doctor will tell you if you need to lightly wrap your arm with an ACE bandage.  Diet  Resume your normal diet. There are not special food restrictions following this procedure. In order to heal from your surgery, it is CRITICAL to get adequate nutrition. Your body requires vitamins, minerals, and protein. Vegetables are the best source of vitamins and minerals. Vegetables also provide the perfect balance of protein. Processed food has little nutritional value, so try to avoid this.  Medications  Resume taking all of your medications. If your incision is causing pain, you may take over-the counter pain relievers such as acetaminophen (Tylenol). If you were prescribed a stronger pain medication, please be aware these medications can cause nausea and constipation. Prevent  nausea by taking the medication with a snack or meal. Avoid constipation by drinking plenty of fluids and eating foods with high amount of fiber, such as fruits, vegetables, and grains.  Do not take Tylenol if you are taking prescription pain medications.  Restart coumadin tomorrow, 01/03/2020   Follow up Your surgeon may want to see you in the office following your access surgery. If so, this will be arranged at the time of your surgery.  Please call us immediately for any of the following conditions:  . Increased pain, redness, drainage (pus) from your incision site . Fever of 101 degrees or higher . Severe or worsening pain at your incision site . Hand pain or numbness. .  Reduce your risk of vascular disease:  . Stop smoking. If you would like help, call [facility] at 1-800-QUIT-NOW [phone]) or [place] at [phone]  . Manage your cholesterol . Maintain a desired weight . Control your diabetes . Keep your blood pressure down  Dialysis  It will take several weeks to several months for your new dialysis access to be ready for use. Your surgeon will determine when it is okay to use it. Your nephrologist will continue to direct your dialysis. You can continue to use your Permcath until your new access is ready for use.   01/02/2020 INMAN KOSIN [id number] 1941/01/08  Surgeon(s): Fields, Jessy Oto, MD  Procedure(s): REVISON OF LEFT ARM ARTERIOVENOUS FISTULA INSERTION OF DIALYSIS CATHETER RIGHT INTERNAL JUGULAR        x Do not stick fistula for 6 weeks Use  catheter only    If you have any questions, please call the office at [phone].

## 2020-01-02 NOTE — Progress Notes (Signed)
Patient's nurse Lenna Sciara ([place]) reviewed patient meds and last dose administration via telephone.

## 2020-01-02 NOTE — Op Note (Signed)
Procedure: 1.  Ultrasound right neck insertion of 23 cm palindrome catheter right internal jugular vein  2.  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  End-stage renal disease  Postoperative diagnosis: Same  Anesthesia: Local with IV sedation  Assistant: Altamese Dilling, PA-C  Operative findings: 23 cm palindrome catheter right internal jugular vein  Operative details: After obtaining informed consent, the patient was taken to the operating room.  The patient was placed in supine position operating table.  After adequate sedation patient's entire neck and chest were prepped and draped in usual sterile fashion.  Local anesthesia was infiltrated at the base of the right neck.  Ultrasound was used to identify the right internal jugular vein which had normal compressibility and respiratory variation.  Introducer needle was used to cannulate the right internal jugular vein under ultrasound guidance.  I initially tried an 035 J-tip guidewire to advance into the right atrium but this would not go suggestive of some possible narrowing.  I was able to get an 035 Bentson wire to advance into the right internal jugular vein down into the right atrium and into the inferior vena cava under fluoroscopic guidance.  Neck sequential December 11, 2015 French dilators with peel-away sheath placed over the guidewire in the right atrium.  Guidewire and dilator were removed and a 23 cm palindrome catheter advanced through the peel-away sheath into the right atrium.  The peel-away sheath was then removed and the catheter was tunneled subcutaneously cut the length and the hub attached.  I made sure that the Silastic portion of the catheter was completely engaged in the hub assembly before snapping this with a good click.  The catheter was noted to flush and draw easily.  It was sutured the skin with nylon sutures.  Neck insertion site was closed with  a Vicryl stitch.  Catheter was then loaded concentrated heparin solution.  Dry sterile dressing was applied.  At this point patient's left upper extremities prepped and draped in usual sterile fashion.  Local anesthesia was infiltrated over the midportion of the fistula encompassing about the central two thirds of the fistula.  An elliptical incision was then made encompassing some skin in the aneurysmal portion.  Incision was carried through the subcutaneous tissues down to the level of the AV fistula.  It was aneurysmal about 4-1/2 cm in diameter.  It was dissected free circumferentially to the segment of 9 aneurysmal vein proximal and distal.  The nonaneurysmal portion of the fistula was about 4 mm in diameter.  The patient was given 5000 inch of intravenous heparin.  The fistula was clamped proximally and distally.  The ellipse of skin and the anterior wall of the AV fistula were removed with Metzenbaum scissors.  The remaining portion of the fistula had a retained valve with a high-grade stenosis in its midportion.  About a centimeter the posterior wall was resected and then the posterior wall reconstructed with a running 5-0 Prolene suture.  I then proceeded to use an additional 5-0 Prolene suture to repair the anterior wall.  Just prior to completion anastomosis it was for blood backbled and thoroughly flushed.  Anastomosis was secured clamps released there is a palpable thrill in fistula immediately.  Hemostasis was obtained.  Patient was given 50 mg of protamine.  Subcutaneous tissues were reapproximated using running 3-0 Vicryl suture.  Skin was closed with a 4-0 Vicryl subcuticular stitch.  Patient tolerated procedure well and  there were no complications.  Dermabond was applied to the incision.  Patient was taken to recovery room in stable condition.  Instrument sponge and needle count was correct the end of the case.  Ruta Hinds, MD [facility] [place] Office:  [phone]

## 2020-01-02 NOTE — Interval H&P Note (Signed)
History and Physical Interval Note:  01/02/2020 9:57 AM  [name]  has presented today for surgery, with the diagnosis of END STAGE RENAL DISEASE ON DIALYSIS.  The various methods of treatment have been discussed with the patient and family. After consideration of risks, benefits and other options for treatment, the patient has consented to  Procedure(s): REVISON OF LEFT ARM ARTERIOVENOUS FISTULA (Left) INSERTION OF DIALYSIS CATHETER (N/A) as a surgical intervention.  The patient's history has been reviewed, patient examined, no change in status, stable for surgery.  I have reviewed the patient's chart and labs.  Questions were answered to the patient's satisfaction.     Ruta Hinds

## 2020-01-02 NOTE — Anesthesia Postprocedure Evaluation (Signed)
Anesthesia Post Note  Patient: [name]  Procedure(s) Performed: REVISON OF LEFT ARM ARTERIOVENOUS FISTULA (Left ) INSERTION OF DIALYSIS CATHETER RIGHT INTERNAL JUGULAR (Right )     Patient location during evaluation: PACU Anesthesia Type: MAC Level of consciousness: awake and alert Pain management: pain level controlled Vital Signs Assessment: post-procedure vital signs reviewed and stable Respiratory status: spontaneous breathing, nonlabored ventilation and respiratory function stable Cardiovascular status: stable and blood pressure returned to baseline Anesthetic complications: no    Last Vitals:  Vitals:   01/02/20 1253 01/02/20 1305  BP: 116/77 134/68  Pulse: 68 70  Resp: 12 16  Temp:    SpO2: 100% 98%    Last Pain:  Vitals:   01/02/20 1240  PainSc: [name]

## 2020-01-03 ENCOUNTER — Ambulatory Visit (HOSPITAL_COMMUNITY): Payer: Medicare Other

## 2020-01-03 ENCOUNTER — Ambulatory Visit (HOSPITAL_COMMUNITY)
Admission: RE | Admit: 2020-01-03 | Discharge: 2020-01-03 | Disposition: A | Discharge status: Home / Self Care | Payer: Medicare Other | Source: Ambulatory Visit | Attending: Vascular Surgery | Admitting: Vascular Surgery

## 2020-01-03 ENCOUNTER — Other Ambulatory Visit: Payer: Self-pay

## 2020-01-03 ENCOUNTER — Ambulatory Visit (HOSPITAL_COMMUNITY): Payer: Medicare Other | Admitting: Anesthesiology

## 2020-01-03 ENCOUNTER — Encounter (HOSPITAL_COMMUNITY)
Admission: RE | Disposition: A | Discharge status: Home / Self Care | Payer: Self-pay | Source: Ambulatory Visit | Attending: Vascular Surgery

## 2020-01-03 DIAGNOSIS — N186 End stage renal disease: Secondary | ICD-10-CM | POA: Insufficient documentation

## 2020-01-03 DIAGNOSIS — G4733 Obstructive sleep apnea (adult) (pediatric): Secondary | ICD-10-CM | POA: Insufficient documentation

## 2020-01-03 DIAGNOSIS — E785 Hyperlipidemia, unspecified: Secondary | ICD-10-CM | POA: Diagnosis not present

## 2020-01-03 DIAGNOSIS — I4891 Unspecified atrial fibrillation: Secondary | ICD-10-CM | POA: Insufficient documentation

## 2020-01-03 DIAGNOSIS — Z79899 Other long term (current) drug therapy: Secondary | ICD-10-CM | POA: Diagnosis not present

## 2020-01-03 DIAGNOSIS — L98499 Non-pressure chronic ulcer of skin of other sites with unspecified severity: Secondary | ICD-10-CM | POA: Diagnosis not present

## 2020-01-03 DIAGNOSIS — I509 Heart failure, unspecified: Secondary | ICD-10-CM | POA: Diagnosis not present

## 2020-01-03 DIAGNOSIS — N2581 Secondary hyperparathyroidism of renal origin: Secondary | ICD-10-CM | POA: Insufficient documentation

## 2020-01-03 DIAGNOSIS — Z87891 Personal history of nicotine dependence: Secondary | ICD-10-CM | POA: Insufficient documentation

## 2020-01-03 DIAGNOSIS — I132 Hypertensive heart and chronic kidney disease with heart failure and with stage 5 chronic kidney disease, or end stage renal disease: Secondary | ICD-10-CM | POA: Diagnosis present

## 2020-01-03 DIAGNOSIS — Z7901 Long term (current) use of anticoagulants: Secondary | ICD-10-CM | POA: Diagnosis not present

## 2020-01-03 DIAGNOSIS — Z95828 Presence of other vascular implants and grafts: Secondary | ICD-10-CM

## 2020-01-03 DIAGNOSIS — Z882 Allergy status to sulfonamides status: Secondary | ICD-10-CM | POA: Diagnosis not present

## 2020-01-03 DIAGNOSIS — Z888 Allergy status to other drugs, medicaments and biological substances status: Secondary | ICD-10-CM | POA: Diagnosis not present

## 2020-01-03 DIAGNOSIS — N400 Enlarged prostate without lower urinary tract symptoms: Secondary | ICD-10-CM | POA: Diagnosis not present

## 2020-01-03 DIAGNOSIS — E1122 Type 2 diabetes mellitus with diabetic chronic kidney disease: Secondary | ICD-10-CM | POA: Diagnosis not present

## 2020-01-03 DIAGNOSIS — T82898A Other specified complication of vascular prosthetic devices, implants and grafts, initial encounter: Secondary | ICD-10-CM

## 2020-01-03 DIAGNOSIS — Z992 Dependence on renal dialysis: Secondary | ICD-10-CM

## 2020-01-03 DIAGNOSIS — Z419 Encounter for procedure for purposes other than remedying health state, unspecified: Secondary | ICD-10-CM

## 2020-01-03 HISTORY — PX: INSERTION OF DIALYSIS CATHETER: SHX1324

## 2020-01-03 LAB — POCT I-STAT, CHEM 8
BUN: 38 mg/dL — ABNORMAL HIGH (ref 8–23)
Calcium, Ion: 0.93 mmol/L — ABNORMAL LOW (ref 1.15–1.40)
Chloride: 101 mmol/L (ref 98–111)
Creatinine, Ser: 5.9 mg/dL — ABNORMAL HIGH (ref 0.61–1.24)
Glucose, Bld: 102 mg/dL — ABNORMAL HIGH (ref 70–99)
HCT: 30 % — ABNORMAL LOW (ref 39.0–52.0)
Hemoglobin: 10.2 g/dL — ABNORMAL LOW (ref 13.0–17.0)
Potassium: 3.7 mmol/L (ref 3.5–5.1)
Sodium: 138 mmol/L (ref 135–145)
TCO2: 28 mmol/L (ref 22–32)

## 2020-01-03 LAB — GLUCOSE, CAPILLARY: Glucose-Capillary: 95 mg/dL (ref 70–99)

## 2020-01-03 SURGERY — INSERTION OF DIALYSIS CATHETER
Anesthesia: Monitor Anesthesia Care | Laterality: Right

## 2020-01-03 MED ORDER — LIDOCAINE-EPINEPHRINE 0.5 %-1:200000 IJ SOLN
INTRAMUSCULAR | Status: AC
  Filled 2020-01-03: qty 1

## 2020-01-03 MED ORDER — FENTANYL CITRATE (PF) 250 MCG/5ML IJ SOLN
INTRAMUSCULAR | Status: AC
  Filled 2020-01-03: qty 5

## 2020-01-03 MED ORDER — LIDOCAINE 2% (20 MG/ML) 5 ML SYRINGE
INTRAMUSCULAR | Status: DC | PRN
  Administered 2020-01-03: 40 mg via INTRAVENOUS

## 2020-01-03 MED ORDER — HEPARIN SODIUM (PORCINE) 1000 UNIT/ML IJ SOLN
INTRAMUSCULAR | Status: AC
  Filled 2020-01-03: qty 1

## 2020-01-03 MED ORDER — HEPARIN SODIUM (PORCINE) 1000 UNIT/ML IJ SOLN
INTRAMUSCULAR | Status: DC | PRN
  Administered 2020-01-03: 3400 [IU]

## 2020-01-03 MED ORDER — SODIUM CHLORIDE (PF) 0.9 % IJ SOLN
INTRAMUSCULAR | Status: AC
  Filled 2020-01-03: qty 20

## 2020-01-03 MED ORDER — LIDOCAINE-EPINEPHRINE 0.5 %-1:200000 IJ SOLN
INTRAMUSCULAR | Status: DC | PRN
  Administered 2020-01-03: 11 mL

## 2020-01-03 MED ORDER — 0.9 % SODIUM CHLORIDE (POUR BTL) OPTIME
TOPICAL | Status: DC | PRN
  Administered 2020-01-03: 1000 mL

## 2020-01-03 MED ORDER — SODIUM CHLORIDE 0.9 % IV SOLN: INTRAVENOUS | Status: DC

## 2020-01-03 MED ORDER — PHENYLEPHRINE HCL-NACL 10-0.9 MG/250ML-% IV SOLN
INTRAVENOUS | Status: DC | PRN
  Administered 2020-01-03: 50 ug/min via INTRAVENOUS

## 2020-01-03 MED ORDER — PROPOFOL 500 MG/50ML IV EMUL
INTRAVENOUS | Status: DC | PRN
  Administered 2020-01-03: 25 ug/kg/min via INTRAVENOUS

## 2020-01-03 MED ORDER — VASOPRESSIN 20 UNIT/ML IV SOLN
INTRAVENOUS | Status: AC
  Filled 2020-01-03: qty 1

## 2020-01-03 MED ORDER — CEFAZOLIN SODIUM 1 G IJ SOLR
INTRAMUSCULAR | Status: AC
  Filled 2020-01-03: qty 20

## 2020-01-03 MED ORDER — CEFAZOLIN SODIUM-DEXTROSE 2-4 GM/100ML-% IV SOLN
2.0000 g | Freq: Once | INTRAVENOUS | Status: AC
  Administered 2020-01-03: 2 g via INTRAVENOUS

## 2020-01-03 MED ORDER — SODIUM CHLORIDE 0.9 % IV SOLN
INTRAVENOUS | Status: AC
  Filled 2020-01-03: qty 1.2

## 2020-01-03 MED ORDER — SODIUM CHLORIDE 0.9 % IV SOLN
INTRAVENOUS | Status: DC | PRN
  Administered 2020-01-03: 500 mL

## 2020-01-03 SURGICAL SUPPLY — 41 items
ADH SKN CLS APL DERMABOND .7 (GAUZE/BANDAGES/DRESSINGS) ×1
BAG DECANTER FOR FLEXI CONT (MISCELLANEOUS) ×3 IMPLANT
BIOPATCH RED 1 DISK 7.0 (GAUZE/BANDAGES/DRESSINGS) ×2 IMPLANT
BIOPATCH RED 1IN DISK 7.0MM (GAUZE/BANDAGES/DRESSINGS) ×1
CATH PALINDROME RT-P 15FX23CM (CATHETERS) ×2 IMPLANT
CATH PALINDROME RT-P 15FX28CM (CATHETERS) IMPLANT
COVER PROBE W GEL 5X96 (DRAPES) ×3 IMPLANT
COVER SURGICAL LIGHT HANDLE (MISCELLANEOUS) ×3 IMPLANT
COVER WAND RF STERILE (DRAPES) ×3 IMPLANT
DECANTER SPIKE VIAL GLASS SM (MISCELLANEOUS) ×3 IMPLANT
DERMABOND ADVANCED (GAUZE/BANDAGES/DRESSINGS) ×2
DERMABOND ADVANCED .7 DNX12 (GAUZE/BANDAGES/DRESSINGS) ×1 IMPLANT
DRAPE C-ARM 42X72 X-RAY (DRAPES) ×3 IMPLANT
DRAPE CHEST BREAST 15X10 FENES (DRAPES) ×3 IMPLANT
GAUZE 4X4 16PLY RFD (DISPOSABLE) ×3 IMPLANT
GAUZE SPONGE 4X4 12PLY STRL (GAUZE/BANDAGES/DRESSINGS) ×2 IMPLANT
GLOVE SS BIOGEL STRL SZ 7.5 (GLOVE) ×1 IMPLANT
GLOVE SUPERSENSE BIOGEL SZ 7.5 (GLOVE) ×2
GOWN STRL REUS W/ TWL LRG LVL3 (GOWN DISPOSABLE) ×2 IMPLANT
GOWN STRL REUS W/TWL LRG LVL3 (GOWN DISPOSABLE) ×6
KIT BASIN OR (CUSTOM PROCEDURE TRAY) ×3 IMPLANT
KIT TURNOVER KIT B (KITS) ×3 IMPLANT
NDL 18GX1X1/2 (RX/OR ONLY) (NEEDLE) ×1 IMPLANT
NDL HYPO 25GX1X1/2 BEV (NEEDLE) ×1 IMPLANT
NEEDLE 18GX1X1/2 (RX/OR ONLY) (NEEDLE) ×3 IMPLANT
NEEDLE 22X1 1/2 (OR ONLY) (NEEDLE) IMPLANT
NEEDLE HYPO 25GX1X1/2 BEV (NEEDLE) ×3 IMPLANT
NS IRRIG 1000ML POUR BTL (IV SOLUTION) ×3 IMPLANT
PACK SURGICAL SETUP 50X90 (CUSTOM PROCEDURE TRAY) ×3 IMPLANT
PAD ARMBOARD 7.5X6 YLW CONV (MISCELLANEOUS) ×6 IMPLANT
SOAP 2 % CHG 4 OZ (WOUND CARE) ×3 IMPLANT
SUT ETHILON 3 0 PS 1 (SUTURE) ×3 IMPLANT
SUT VICRYL 4-0 PS2 18IN ABS (SUTURE) ×3 IMPLANT
SYR 10ML LL (SYRINGE) ×3 IMPLANT
SYR 20ML LL LF (SYRINGE) ×3 IMPLANT
SYR 5ML LL (SYRINGE) ×6 IMPLANT
SYR CONTROL 10ML LL (SYRINGE) ×3 IMPLANT
TAPE CLOTH SURG 4X10 WHT LF (GAUZE/BANDAGES/DRESSINGS) ×2 IMPLANT
TOWEL GREEN STERILE (TOWEL DISPOSABLE) ×6 IMPLANT
TOWEL GREEN STERILE FF (TOWEL DISPOSABLE) ×3 IMPLANT
WATER STERILE IRR 1000ML POUR (IV SOLUTION) ×3 IMPLANT

## 2020-01-03 NOTE — Interval H&P Note (Signed)
History and Physical Interval Note:  01/03/2020 10:15 AM  [name]  has presented today for surgery, with the diagnosis of ESRD.  The various methods of treatment have been discussed with the patient and family. After consideration of risks, benefits and other options for treatment, the patient has consented to  Procedure(s): INSERTION OF DIALYSIS CATHETER (N/A) as a surgical intervention.  The patient's history has been reviewed, patient examined, no change in status, stable for surgery.  I have reviewed the patient's chart and labs.  Questions were answered to the patient's satisfaction.    The patient had revision of his left arm fistula and hemodialysis catheter placed yesterday by Dr. Oneida Alar.  Apparently became confused and pulled his catheter out last night.  Will have placement of new catheter today  Curt Jews

## 2020-01-03 NOTE — Anesthesia Postprocedure Evaluation (Signed)
Anesthesia Post Note  Patient: Edward SHERIDAN  Procedure(s) Performed: INSERTION OF DIALYSIS CATHETER RIGHT INTERNAL JUGULAR (Right )     Patient location during evaluation: PACU Anesthesia Type: MAC Level of consciousness: awake and alert Pain management: pain level controlled Vital Signs Assessment: post-procedure vital signs reviewed and stable Respiratory status: spontaneous breathing, nonlabored ventilation and respiratory function stable Cardiovascular status: stable and blood pressure returned to baseline Anesthetic complications: no    Last Vitals:  Vitals:   01/03/20 1415 01/03/20 1443  BP:  (!) 144/77  Pulse:    Resp:  20  Temp: 36.7 C 36.8 C  SpO2: 100% 100%    Last Pain:  Vitals:   01/03/20 1443  PainSc: 0-No pain                 Audry Pili

## 2020-01-03 NOTE — Transfer of Care (Signed)
Immediate Anesthesia Transfer of Care Note  Patient: [name]  Procedure(s) Performed: INSERTION OF DIALYSIS CATHETER RIGHT INTERNAL JUGULAR (Right )  Patient Location: PACU  Anesthesia Type:MAC  Level of Consciousness: awake  Airway & Oxygen Therapy: Patient Spontanous Breathing  Post-op Assessment: Report given to RN and Post -op Vital signs reviewed and stable  Post vital signs: Reviewed and stable  Last Vitals:  Vitals Value Taken Time  BP 138/61 01/03/20 1327  Temp    Pulse    Resp 34 01/03/20 1330  SpO2    Vitals shown include unvalidated device data.  Last Pain:  Vitals:   01/03/20 1001  PainSc: 0-No pain      Patients Stated Pain Goal: 3 (09/60/45 4098)  Complications: No apparent anesthesia complications

## 2020-01-03 NOTE — Op Note (Signed)
    OPERATIVE REPORT  DATE OF SURGERY: 01/03/2020  PATIENT: [name], 79 y.o. male MRN: [id number]  DOB: July 04, 1941  PRE-OPERATIVE DIAGNOSIS: Narrowing end-stage renal disease  POST-OPERATIVE DIAGNOSIS:  Same  PROCEDURE: Right IJ tunneled hemodialysis catheter  SURGEON:  Curt Jews, M.D.  PHYSICIAN ASSISTANT: Nurse  ANESTHESIA: Local with sedation  EBL: per anesthesia record  Total I/O In: 300 [I.V.:300] Out: 25 [Blood:25]  BLOOD ADMINISTERED: none  DRAINS: none  SPECIMEN: none  COUNTS CORRECT:  YES  PATIENT DISPOSITION:  PACU - hemodynamically stable  PROCEDURE DETAILS: Patient had a right IJ tunneled hemodialysis catheter placed yesterday in conjunction with revision of a left upper arm fistula.  The was confused and pulled out his catheter overnight and we were contacted by his nursing facility this morning.  He is taken to the operating this time for replacement.  The right and left neck and chest were prepped and draped in usual sterile fashion.  The patient was placed in Trendelenburg position.  SonoSite ultrasound was used to visualize the right internal jugular vein.  The 18-gauge needle was used after local anesthesia to access the internal jugular vein and a guidewire was passed down to the right atrium.  This was confirmed with fluoroscopy.  A dilator and peel-away sheath was placed over the guidewire.  The dilator and guidewire removed.  The catheter was placed through the peel-away sheath which was removed.  The catheter tips were positioned the level of the distal right atrium.  Catheter was brought through subcutaneous tunnel through a separate stab incision and the 2 lm ports were attached.  Both lumens flushed and aspirated easily and were locked 1000 unit/cc heparin.  Catheter secured to the skin with 3-0 nylon suture.  The entry site was closed with 4 subcuticular Vicryl stitch.  Sterile dressing was applied the patient was transferred to the recovery  room where chest x-rays pending   Rosetta Posner, M.D., [facility] 01/03/2020 1:35 PM

## 2020-01-03 NOTE — Discharge Instructions (Addendum)
Resume previous discharge instructions.

## 2020-01-03 NOTE — Anesthesia Procedure Notes (Signed)
Procedure Name: MAC Date/Time: 01/03/2020 12:22 PM Performed by: Kyung Rudd, CRNA Pre-anesthesia Checklist: Patient identified, Emergency Drugs available, Suction available and Patient being monitored Patient Re-evaluated:Patient Re-evaluated prior to induction Oxygen Delivery Method: Simple face mask Induction Type: IV induction Placement Confirmation: positive ETCO2

## 2020-01-03 NOTE — Anesthesia Preprocedure Evaluation (Addendum)
Anesthesia Evaluation  Patient identified by MRN, date of birth, ID band Patient confused  General Assessment Comment:Awake, poor historian   Reviewed: Allergy & Precautions, NPO status , Patient's Chart, lab work & pertinent test results, reviewed documented beta blocker date and time   History of Anesthesia Complications Negative for: history of anesthetic complications  Airway Mallampati: II  TM Distance: >3 FB Neck ROM: Full    Dental  (+) Partial Upper, Missing, Dental Advisory Given   Pulmonary sleep apnea , former smoker,    Pulmonary exam normal        Cardiovascular hypertension, Pt. on home beta blockers and Pt. on medications + Peripheral Vascular Disease  + dysrhythmias Atrial Fibrillation + Valvular Problems/Murmurs AS  Rhythm:Irregular Rate:Normal + Systolic murmurs  '21 TTE - Severe calcification of the aortic valve with severely restricted leaflet motion. Moderate AS with gradient 21 mmHg. Trivial AI. EF 65 to 70%. Mildly increased left ventricular hypertrophy. Global right ventricle has mildly reduced systolic function. Severely elevated pulmonary artery systolic pressure.The tricuspid regurgitant velocity is 3.53 m/s, and with an assumed right atrial pressure of 15 mmHg, the estimated right ventricular systolic pressure is severely elevated at 64.8 mmHg. LA was moderately dilated. RA was mildly dilated.  Trace MR. Mild-moderate TR. Trivial PR.  '19 Carotid US - 1-39% b/l ICAS    Neuro/Psych PSYCHIATRIC DISORDERS CVA    GI/Hepatic Neg liver ROS, GERD  Medicated,  Endo/Other  diabetes  Renal/GU ESRF and DialysisRenal disease     Musculoskeletal negative musculoskeletal ROS (+)   Abdominal   Peds  Hematology  (+) anemia ,   Anesthesia Other Findings Covid neg 01/02/20   Reproductive/Obstetrics                           Anesthesia Physical  Anesthesia Plan  ASA:  IV  Anesthesia Plan: MAC   Post-op Pain Management:    Induction: Intravenous  PONV Risk Score and Plan: 1 and Treatment may vary due to age or medical condition and Propofol infusion  Airway Management Planned: Natural Airway and Simple Face Mask  Additional Equipment: None  Intra-op Plan:   Post-operative Plan:   Informed Consent: I have reviewed the patients History and Physical, chart, labs and discussed the procedure including the risks, benefits and alternatives for the proposed anesthesia with the patient or authorized representative who has indicated his/her understanding and acceptance.       Plan Discussed with: CRNA and Anesthesiologist  Anesthesia Plan Comments: (Vasopressin in room for any hypotension)       Anesthesia Quick Evaluation

## 2020-01-04 ENCOUNTER — Non-Acute Institutional Stay (SKILLED_NURSING_FACILITY): Payer: Medicare Other | Admitting: Internal Medicine

## 2020-01-04 DIAGNOSIS — T829XXD Unspecified complication of cardiac and vascular prosthetic device, implant and graft, subsequent encounter: Secondary | ICD-10-CM

## 2020-01-04 DIAGNOSIS — I4891 Unspecified atrial fibrillation: Secondary | ICD-10-CM

## 2020-01-04 NOTE — Progress Notes (Signed)
Location:  [place] Room Number: 509-P Place of Service:  SNF [phone]) Provider:  Granville Lewis, PA-C   Patient Care Team: Hennie Duos, MD as PCP - General (Internal Medicine) [name], MD as Consulting Physician (Nephrology) Rehab, [facility], [facility]  Extended Emergency Contact Information Primary Emergency Contact: [name],[name] Address: [place], [place] [address] [name] of [place] Phone: [phone] Work Phone: [phone] Mobile Phone: [phone] Relation: Daughter Secondary Emergency Contact: [place] Phone: [phone] Mobile Phone: [phone] Relation: Other  Code Status:  DNR Goals of care: Advanced Directive information Advanced Directives 01/04/2020  Does Patient Have a Medical Advance Directive? Yes  Type of Advance Directive Out of facility DNR (pink MOST or yellow form)  Does patient want to make changes to medical advance directive? No - Patient declined  Copy of [place] in Chart? -  Would patient like information on creating a medical advance directive? -  Pre-existing out of facility DNR order (yellow form or pink MOST form) Yellow form placed in chart (order not valid for inpatient use);Pink MOST form placed in chart (order not valid for inpatient use)     Chief Complaint  Patient presents with  . Acute Visit    Followup after replacement of new dialysis shunt    HPI:  Pt is a 79 y.o. male seen today for an acute visit for follow-up of dialysis shunt catheter.  Patient is a long-term resident of the facility with a history of end-stage renal disease-he is also on Coumadin with a history of atrial fibrillation-Coumadin was held before his procedure.  Patient had a right IJ tunneled dialysis catheter placed earlier this week in conjunction with revision of a left upper arm fistula.  He returned to the  facility but was confused and pulled out his catheter and he was sent back to have the catheter replaced.  This was successfully done without apparent complication He has returned to the facility and does not appear to have any complaints-- nursing does not report any further issues.  His Coumadin will be restarted tomorrow per previous orders.     Past Medical History:  Diagnosis Date  . A-fib ([place])   . Anemia   . Aortic stenosis   . Blood transfusion   . BPH (benign prostatic hyperplasia)   . CHF (congestive heart failure) ([place])   . Diarrhea   . 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 ESRD on hemodialysis ([place])    Started dialysis in 2009  . History of GI bleed    secondary to coumadin  . HTN (hypertension)   . Hyperlipidemia   . OSA (obstructive sleep apnea)    uses CPAP  . Sacral wound    stage II - healed, has a protective dressing on- 12/28/2019  . Secondary hyperparathyroidism of renal origin [facility])    Past Surgical History:  Procedure Laterality Date  . ABDOMINAL AORTOGRAM N/A 11/15/2018   Procedure: ABDOMINAL AORTOGRAM;  Surgeon: [name], MD;  Location: [place] CV LAB;  Service: Cardiovascular;  Laterality: N/A;  . AMPUTATION Left 12/06/2018   Procedure: AMPUTATION DIGIT LEFT FIFTH TOE;  Surgeon: Angelia Mould, MD;  Location: [place];  Service: Vascular;  Laterality: Left;  . AMPUTATION Bilateral 12/29/2018   Procedure: AMPUTATION ABOVE KNEE;  Surgeon: Waynetta Sandy, MD;  Location: [facility] OR;  Service: Vascular;  Laterality: Bilateral;  . APPLICATION OF WOUND VAC Right 02/17/2019   Procedure: Application Of Wound Vac;  Surgeon: Marty Heck, MD;  Location: [place];  Service: Vascular;  Laterality: Right;  . BVT  [id number]   Left  Basilic Vein Transposition  . CHOLECYSTECTOMY    . EYE SURGERY     Catarct bil  . I & D EXTREMITY Right 02/17/2019   Procedure: Right above the kneee debridement;  Surgeon: Marty Heck, MD;  Location: [place];  Service: Vascular;  Laterality: Right;  . INSERTION OF DIALYSIS CATHETER  05/28/2012   Procedure: INSERTION OF DIALYSIS CATHETER;  Surgeon: Mal Misty, MD;  Location: [place];  Service: Vascular;  Laterality: Right;  . INSERTION OF DIALYSIS CATHETER Right 01/02/2020   Procedure: INSERTION OF DIALYSIS CATHETER RIGHT INTERNAL JUGULAR;  Surgeon: Elam Dutch, MD;  Location: [place];  Service: Vascular;  Laterality: Right;  . INSERTION OF DIALYSIS CATHETER Right 01/03/2020   Procedure: INSERTION OF DIALYSIS CATHETER RIGHT INTERNAL JUGULAR;  Surgeon: Rosetta Posner, MD;  Location: [place];  Service: Vascular;  Laterality: Right;  . Left arm shuntogram.    . Left forearm loop graft with 6 mm Gore-Tex graft.    . LOWER EXTREMITY ANGIOGRAPHY Bilateral 11/15/2018   Procedure: LOWER EXTREMITY ANGIOGRAPHY;  Surgeon: [name], MD;  Location: [place] [facility];  Service: Cardiovascular;  Laterality: Bilateral;  . Pars plana vitrectomy with 25-gauge system    . PERIPHERAL VASCULAR ATHERECTOMY Left 11/15/2018   Procedure: PERIPHERAL VASCULAR ATHERECTOMY;  Surgeon: [name], MD;  Location: [place] [facility];  Service: Cardiovascular;  Laterality: Left;  SFA with STENT  . PERIPHERAL VASCULAR BALLOON ANGIOPLASTY Left 11/15/2018   Procedure: PERIPHERAL VASCULAR BALLOON ANGIOPLASTY;  Surgeon: [name], MD;  Location: [place] [facility];  Service: Cardiovascular;  Laterality: Left;  PT  . REVISON OF ARTERIOVENOUS FISTULA Left 01/02/2020   Procedure: REVISON OF LEFT ARM ARTERIOVENOUS FISTULA;  Surgeon: Elam Dutch, MD;  Location: [place];  Service: Vascular;  Laterality: Left;    Allergies  Allergen Reactions  . Occlusive Silicone Sheets [Silicone] Other (See Comments)    "Allergic," per MAR  . Other Other (See Comments)    Unknown reaction to Occlusive adhesive- "Allergic," per MAR  . Sulfa Antibiotics   . Tape Itching and Other (See Comments)     Use Cloth tape only, please    Outpatient Encounter Medications as of 01/04/2020  Medication Sig  . acetaminophen (TYLENOL) 500 MG tablet Take 1,000 mg by mouth 2 (two) times daily.   . Amino Acids-Protein Hydrolys (FEEDING SUPPLEMENT, PRO-STAT SUGAR FREE 64,) LIQD Take 30 mLs by mouth 2 (two) times daily. 9a and 5p  . ascorbic acid (VITAMIN C) 500 MG tablet Take 500 mg by mouth daily.  Marland Kitchen atorvastatin (LIPITOR) 20 MG tablet Take 20 mg by mouth daily.  . Cholecalciferol (VITAMIN D3) 1.25 MG (50000 UT) CAPS Take 50,000 Units by mouth every Friday.   . dicyclomine (BENTYL) 20 MG tablet Take 20 mg by mouth 3 (three) times daily before meals.  . divalproex (DEPAKOTE SPRINKLE) 125 MG capsule Take 125-250 mg by mouth See admin instructions. Take 125mg  in the AM and 250mg  in the PM.  . divalproex (DEPAKOTE SPRINKLE) 125 MG capsule Take 250 mg by mouth at bedtime.  Marland Kitchen doxercalciferol (HECTOROL) 4 MCG/2ML injection Inject 2 mcg into the vein every Monday, Wednesday, and Friday  with hemodialysis.  Marland Kitchen HYDROcodone-acetaminophen (NORCO) 5-325 MG tablet Take 1 tablet by mouth every 6 (six) hours as needed for moderate pain.  Marland Kitchen HYDROcodone-acetaminophen (NORCO/VICODIN) 5-325 MG tablet Take 1 tablet by mouth 2 (two) times daily as needed for moderate pain.  Marland Kitchen loperamide (IMODIUM A-D) 2 MG tablet Take 4 mg by mouth See admin instructions. Take 2 tablets ( 4 mg) by mouth after 1st loose stool as needed for diarrhea  . metoprolol tartrate (LOPRESSOR) 25 MG tablet Take 25 mg by mouth See admin instructions. On Mon, Wed, Fri take 25mg  at bedtime. On all other days take 25mg  twice a day.  . midodrine (PROAMATINE) 10 MG tablet Take one tablet (10 mg) by mouth on Monday, Wednesday, Friday 30 minutes prior to dialysis.  Marland Kitchen MODERNA COVID-19 VACCINE 100 MCG/0.5ML SUSP Inject 0.5 mLs into the muscle once.  . multivitamin (RENA-VIT) TABS tablet Take 1 tablet by mouth at bedtime.  . Nutritional Supplements (ADULT NUTRITIONAL  SUPPLEMENT PO) Take 1 each by mouth daily with supper. Magic Cup with dinner  . Nutritional Supplements (NEPRO PO) Take 1 Can by mouth daily.   . pantoprazole (PROTONIX) 40 MG tablet Take 1 tablet (40 mg total) by mouth daily.  . sevelamer carbonate (RENVELA) 800 MG tablet Take 1,600 mg by mouth 3 (three) times daily with meals.  . zinc sulfate 220 (50 Zn) MG capsule Take 220 mg by mouth every other day.  . [DISCONTINUED] warfarin (COUMADIN) 2.5 MG tablet Take 2.5 mg by mouth daily at 6 PM.    No facility-administered encounter medications on file as of 01/04/2020.    Review of Systems.  This is limited somewhat secondary to dementia.  General no complaints of fever or chills.  Skin no complaints of rashes or itching.  Head ears eyes nose mouth and throat is not complaining of difficulty swallowing or sore throat or visual changes.  Respiratory no evidence of shortness of breath or current complaints of cough.  Cardiac no complaints of chest pain.  GI does not complain of having a stomachache or having nausea vomiting or diarrhea.  GU does have end-stage renal disease on chronic dialysis as noted above no complaints of dysuria.  Musculoskeletal he is status post bilateral above-the-knee amputations he does not complain of joint pain.  Neurologic he is not complaining of dizziness or headache or syncope.  And psych does have some cognitive deficits does not appear to be depressed or anxious however.    Immunization History  Administered Date(s) Administered  . Influenza-Unspecified 08/29/2019  . Pneumococcal Polysaccharide-23 10/07/2019   Pertinent  Health Maintenance Due  Topic Date Due  . FOOT EXAM  11/01/1951  . OPHTHALMOLOGY EXAM  11/01/1951  . HEMOGLOBIN A1C  03/07/2020  . PNA vac Low Risk Adult (2 of 2 - PCV13) 10/06/2020  . INFLUENZA VACCINE  Completed  . URINE MICROALBUMIN  Discontinued   No flowsheet data found. Functional Status Survey:    Vitals:    01/04/20 1555  BP: (!) 112/59  Pulse: 66  Resp: 18  Temp: 98.1 F (36.7 C)  TempSrc: Oral  Weight: 112 lb 12.8 oz (51.2 kg)  Height: 3' (0.914 m)   Body mass index is 61.19 kg/m. Physical Exam General this is a pleasant elderly male in no distress lying comfortably in bed.  His skin is warm and dry-on right thorax there is a dialysis catheter there is just is very small amount of dried blood on the dressing otherwise appears unremarkable.  Left  upper arm there is a shunt with a positive bruit this appears unremarkable.  Eyes visual acuity appears to be intact.  Oropharynx is clear mucous membranes moist.  Chest is clear to auscultation somewhat shallow air entry there is no labored breathing.  Heart is irregular irregular rate and rhythm with a baseline systolic murmur.  Abdomen soft nontender with positive bowel sounds.  Musculoskeletal is status post bilateral AKA's does move his upper extremities at baseline.  Neurologic is grossly intact his speech is clear but he does not speak a whole lot-no evidence of lateralizing findings.  Neuro cranial nerves appear to be intact.  Psych he is oriented to self he does follow simple verbal commands.   Labs reviewed: Recent Labs    02/20/19 1300 02/27/19 0000 05/28/19 0253 05/29/19 0541 05/29/19 2316 05/30/19 0727 05/30/19 0727 05/31/19 1319 07/27/19 1529 08/16/19 1818 01/02/20 0841 01/03/20 1106  NA 137   < > 137  --   --  137   < > 136 138 136 140 138  K 3.0*  --  3.7  --   --  5.1  --  3.6 3.5 4.0 3.6 3.7  CL 101   < > 102  --   --  101  --  98 95* 97* 95* 101  CO2 23   < > 21*  --   --  17*  --  24 26 25   --   --   GLUCOSE 101*   < > 151*  --   --  99  --  140* 143* 75 120* 102*  BUN 27*   < > 80*  --   --  158*   < > 44* 48* 28* 28* 38*  CREATININE 3.41*   < > 6.12*  --   --  8.32*   < > 4.29* 4.61* 3.59* 3.80* 5.90*  CALCIUM 7.7*   < > 9.3  --   --  10.0  --  9.1 10.2 9.5  --   --   MG  --    < > 2.0 2.1 2.4   --   --   --   --   --   --   --   PHOS 2.4*  --   --   --   --  2.6  --  3.0  --   --   --   --    < > = values in this interval not displayed.   Recent Labs    05/27/19 0348 05/28/19 0253 05/30/19 0727 05/31/19 1319 08/16/19 1818  AST 55* 31  --   --  54*  ALT 55* 42  --   --  38  ALKPHOS 272* 261*  --   --  229*  BILITOT 0.8 0.6  --   --  1.2  PROT 7.0 6.5  --   --  7.8  ALBUMIN 2.4* 2.2* 2.3* 2.3* 2.9*   Recent Labs    02/27/19 0000 05/31/19 1319 06/06/19 0000 07/27/19 1529 08/16/19 1818 01/02/20 0841 01/03/20 1106  WBC  --  10.3 11.5 18.6* 6.9  --   --   NEUTROABS  --   --  8 15.5* 4.4  --   --   HGB   < > 7.6* 9.3* 12.4* 11.0* 11.9* 10.2*  HCT   < > 24.7* 29* 40.2 34.8* 35.0* 30.0*  MCV  --  88.5  --  89.3 89.5  --   --   PLT   < >  220 251 171 193  --   --    < > = values in this interval not displayed.   Lab Results  Component Value Date   TSH 1.839 05/25/2019   Lab Results  Component Value Date   HGBA1C 4.7 09/08/2019   Lab Results  Component Value Date   CHOL 113 11/11/2018   HDL 39 (L) 11/11/2018   LDLCALC 62 11/11/2018   TRIG 60 11/11/2018   CHOLHDL 2.9 11/11/2018    Significant Diagnostic Results in last 30 days:  DG Chest 1 View  Result Date: 01/02/2020 CLINICAL DATA:  s/p placement right IJ dialysis catheter EXAM: CHEST  1 VIEW COMPARISON:  08/16/2019 FINDINGS: Large-bore central venous catheter with tip in the distal SVC. No pneumothorax. Bilateral fine airspace disease present. No effusion. No pneumothorax. IMPRESSION: 1. Central venous catheter in good position. 2. No pneumothorax. 3. Bilateral fine airspace disease suggests pulmonary edema. Electronically Signed   By: Suzy Bouchard M.D.   On: 01/02/2020 13:35   DG Chest Port 1 View  Result Date: 01/03/2020 CLINICAL DATA:  Dialysis catheter insertion. EXAM: PORTABLE CHEST 1 VIEW COMPARISON:  01/02/2020 FINDINGS: Right internal jugular catheter has its tip in the mid right atrium. No  pneumothorax. Interstitial and alveolar edema persists. Focal alveolar filling in the left mid lung as seen previously. IMPRESSION: Central line well positioned with the tip in the right atrium. No pneumothorax. Mild edema and focal airspace filling in the left mid lung as seen previously. Electronically Signed   By: Nelson Chimes M.D.   On: 01/03/2020 13:39   DG Fluoro Guide CV Line-No Report  Result Date: 01/03/2020 Fluoroscopy was utilized by the requesting physician.  No radiographic interpretation.   DG Fluoro Guide CV Line-No Report  Result Date: 01/02/2020 Fluoroscopy was utilized by the requesting physician.  No radiographic interpretation.   ECHOCARDIOGRAM COMPLETE  Result Date: 01/02/2020   ECHOCARDIOGRAM REPORT   Patient Name:   RAYDEN KOHRING Date of Exam: 01/02/2020 Medical Rec #:  [id number]       Height:       36.0 in Accession #:    [id number]      Weight:       117.7 lb Date of Birth:  April 17, 1941       BSA:          1.03 m Patient Age:    69 years        BP:           119/57 mmHg Patient Gender: M               HR:           70 bpm. Exam Location:  Inpatient Procedure: 2D Echo, Cardiac Doppler and Color Doppler Indications:    I35.0 Nonrheumatic aortic (valve) stenosis  History:        Patient has prior history of Echocardiogram examinations, most                 recent 11/08/2018. CHF, Abnormal ECG, Pulmonary HTN and Stroke,                 Aortic Valve Disease, Signs/Symptoms:Dyspnea; Risk                 Factors:Diabetes and Sleep Apnea. Aortic stenosis.  Sonographer:    Roseanna Rainbow RDCS Referring Phys: [place]  1. Severe calcification of the aortic valve with severely restricted leaflet motion. Moderate aortic stenosis with  gradient 21 mmHg. Aortic valve regurgitation is trivial.  2. Left ventricular ejection fraction, by visual estimation, is 65 to 70%. The left ventricle has hyperdynamic function. There is mildly increased left ventricular hypertrophy.  3.  Elevated left ventricular end-diastolic pressure.  4. Global right ventricle has mildly reduced systolic function.The right ventricular size is normal. No increase in right ventricular wall thickness.  5. Severely elevated pulmonary artery systolic pressure.  6. The tricuspid regurgitant velocity is 3.53 m/s, and with an assumed right atrial pressure of 15 mmHg, the estimated right ventricular systolic pressure is severely elevated at 64.8 mmHg.  7. Left atrial size was moderately dilated.  8. Right atrial size was mildly dilated.  9. Mild mitral annular calcification. 10. The mitral valve is normal in structure. Trivial mitral valve regurgitation. No evidence of mitral stenosis. 11. The tricuspid valve is normal in structure. Mild-moderate TR. 12. The pulmonic valve was normal in structure. Pulmonic valve regurgitation is trivial. 13. The inferior vena cava is dilated in size with <50% respiratory variability, suggesting right atrial pressure of 15 mmHg. FINDINGS  Left Ventricle: Left ventricular ejection fraction, by visual estimation, is 65 to 70%. The left ventricle has hyperdynamic function. The left ventricle is not well visualized. There is mildly increased left ventricular hypertrophy. The left ventricular  diastology could not be evaluated due to atrial fibrillation. Left ventricular diastolic function could not be evaluated. Elevated left ventricular end-diastolic pressure. Right Ventricle: The right ventricular size is normal. No increase in right ventricular wall thickness. Global RV systolic function is has mildly reduced systolic function. The tricuspid regurgitant velocity is 3.53 m/s, and with an assumed right atrial pressure of 15 mmHg, the estimated right ventricular systolic pressure is severely elevated at 64.8 mmHg. Left Atrium: Left atrial size was moderately dilated. Right Atrium: Right atrial size was mildly dilated Pericardium: There is no evidence of pericardial effusion. Mitral Valve: The  mitral valve is normal in structure. Mild mitral annular calcification. Trivial mitral valve regurgitation. No evidence of mitral valve stenosis by observation. MV peak gradient, 13.5 mmHg. Tricuspid Valve: The tricuspid valve is normal in structure. Tricuspid valve regurgitation mild-moderate. Aortic Valve: The aortic valve is abnormal. Aortic valve regurgitation is trivial. Moderate aortic stenosis is present. There is severe calcifcation of the aortic valve. Aortic valve mean gradient measures 20.5 mmHg. Aortic valve peak gradient measures 34.3 mmHg. Aortic valve area, by VTI measures 0.76 cm. Pulmonic Valve: The pulmonic valve was normal in structure. Pulmonic valve regurgitation is trivial. Pulmonic regurgitation is trivial. Aorta: The aortic root and ascending aorta are structurally normal, with no evidence of dilitation. Venous: The inferior vena cava is dilated in size with less than 50% respiratory variability, suggesting right atrial pressure of 15 mmHg. IAS/Shunts: No atrial level shunt detected by color flow Doppler. There is no evidence of a patent foramen ovale. No ventricular septal defect is seen or detected. There is no evidence of an atrial septal defect.  LEFT VENTRICLE PLAX 2D LVIDd:         3.00 cm LVIDs:         1.90 cm LV PW:         1.55 cm LV IVS:        1.50 cm LVOT diam:     1.60 cm LV SV:         24 ml LV SV Index:   19.31 LVOT Area:     2.01 cm  LV Volumes (MOD) LV area d, A2C:  16.30 cm LV area d, A4C:    15.70 cm LV area s, A2C:    8.27 cm LV area s, A4C:    10.22 cm LV major d, A2C:   5.62 cm LV major d, A4C:   5.68 cm LV major s, A2C:   5.18 cm LV major s, A4C:   5.25 cm LV vol d, MOD A2C: 40.5 ml LV vol d, MOD A4C: 35.5 ml LV vol s, MOD A2C: 12.5 ml LV vol s, MOD A4C: 17.2 ml LV SV MOD A2C:     28.0 ml LV SV MOD A4C:     35.5 ml LV SV MOD BP:      23.4 ml RIGHT VENTRICLE            IVC RV S prime:     5.55 cm/s  IVC diam: 2.10 cm TAPSE (M-mode): 1.2 cm LEFT ATRIUM              Index       RIGHT ATRIUM           Index LA diam:        4.50 cm 4.37 cm/m  RA Area:     15.60 cm LA Vol (A2C):   42.9 ml 41.71 ml/m RA Volume:   34.30 ml  33.35 ml/m LA Vol (A4C):   48.6 ml 47.25 ml/m LA Biplane Vol: 46.6 ml 45.30 ml/m  AORTIC VALVE AV Area (Vmax):    0.86 cm AV Area (Vmean):   0.89 cm AV Area (VTI):     0.76 cm AV Vmax:           293.00 cm/s AV Vmean:          205.000 cm/s AV VTI:            0.692 m AV Peak Grad:      34.3 mmHg AV Mean Grad:      20.5 mmHg LVOT Vmax:         126.00 cm/s LVOT Vmean:        90.600 cm/s LVOT VTI:          0.261 m LVOT/AV VTI ratio: 0.38  AORTA Ao Root diam: 3.00 cm Ao Asc diam:  3.00 cm MITRAL VALVE                        TRICUSPID VALVE MV Area (PHT): 3.99 cm             TR Peak grad:   49.8 mmHg MV Peak grad:  13.5 mmHg            TR Vmax:        361.00 cm/s MV Mean grad:  3.0 mmHg MV Vmax:       1.84 m/s             SHUNTS MV Vmean:      67.1 cm/s            Systemic VTI:  0.26 m MV VTI:        0.46 m               Systemic Diam: 1.60 cm MV PHT:        55.10 msec MV Decel Time: 190 msec MV E velocity: 112.00 cm/s 103 cm/s  [name] MD Electronically signed by [name] MD Signature Date/Time: 01/02/2020/8:51:04 AM    Final     Assessment/Plan  #1 history of end-stage renal disease  on chronic dialysis-had recent catheter replacements-1 was pulled out secondary to patient confusion and replaced it appears this was unremarkable-he is returned to the facility and this appears to be stable at this point catheter sites do not look concerning at this point-will need monitoring.  2.  History of atrial fibrillation this appears rate controlled he is on Lopressor-has been on Coumadin for anticoagulation-Coumadin was held because of the recent procedures-this will be restarted tomorrow at previous dose-INR pending for Monday  [id number]      Granville Lewis, PA-C [phone]

## 2020-01-07 ENCOUNTER — Encounter: Payer: Self-pay | Admitting: Internal Medicine

## 2020-01-17 ENCOUNTER — Non-Acute Institutional Stay (SKILLED_NURSING_FACILITY): Payer: Medicare Other | Admitting: Internal Medicine

## 2020-01-17 DIAGNOSIS — I4891 Unspecified atrial fibrillation: Secondary | ICD-10-CM

## 2020-01-17 DIAGNOSIS — N186 End stage renal disease: Secondary | ICD-10-CM

## 2020-01-17 DIAGNOSIS — Z89611 Acquired absence of right leg above knee: Secondary | ICD-10-CM | POA: Diagnosis not present

## 2020-01-17 DIAGNOSIS — I10 Essential (primary) hypertension: Secondary | ICD-10-CM

## 2020-01-17 DIAGNOSIS — Z992 Dependence on renal dialysis: Secondary | ICD-10-CM

## 2020-01-17 DIAGNOSIS — Z89612 Acquired absence of left leg above knee: Secondary | ICD-10-CM

## 2020-01-17 NOTE — Progress Notes (Signed)
This is a routine visit  .  Level of care is skilled.  [facility]ility [facility]  Chief complaint-routine visit for medical management of chronic medical conditions including end-stage renal disease on dialysis-peripheral vascular disease status post AKA bilateral-as well as I tremors dementia CHF hypertension and atrial fibrillation.  History of present illness.  Patient is a pleasant 79 year old male with the above diagnosis nursing does not really report any recent acute issues.  Most recent acute issue was having his tunneled dialysis catheter replaced in conjunction with revision of his left upper arm fistula.  Apparently he pulled out his catheter after returning the facility and was sent back to the ER to have his catheter replaced.  This apparently was done successfully and there has been no further complication to my knowledge.  His other diagnoses appear to be stable.  Apparently he is tolerating his dialysis fairly well.  He does have orders for midodrine on dialysis days for apparent hypotension.  He continues on Depakote twice a day for some increased behaviors and these apparently have stabilized.  He also is on Coumadin with a history of atrial fibrillation update INR is pending for tomorrow   He continues on Lopressor for rate control receive this twice a day on nondialysis days and only in the evening on dialysis days-pulse appears to be controlled with rates in the 70s to 90s recently.  He also has a history of bilateral AKA's with a history of peripheral vascular disease he does have Vicodin for pain.  He does not really have pain complaints currently he also has orders for Tylenol.  Currently he is resting bed comfortably he is somewhat confused but bright and alert nursing does not report any issues   Past Medical History:  Diagnosis Date  . A-fib ([place])   . Anemia   . Aortic stenosis   . Blood transfusion   . BPH (benign prostatic hyperplasia)    . CHF (congestive heart failure) ([place])   . Diarrhea   . 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 ESRD on hemodialysis ([place])    Started dialysis in 2009  . History of GI bleed    secondary to coumadin  . HTN (hypertension)   . Hyperlipidemia   . OSA (obstructive sleep apnea)    uses CPAP  . Sacral wound    stage II - healed, has a protective dressing on- 12/28/2019  . Secondary hyperparathyroidism of renal origin [facility])         Past Surgical History:  Procedure Laterality Date  . ABDOMINAL AORTOGRAM N/A 11/15/2018   Procedure: ABDOMINAL AORTOGRAM;  Surgeon: [name], MD;  Location: [place] [facility];  Service: Cardiovascular;  Laterality: N/A;  . AMPUTATION Left 12/06/2018   Procedure: AMPUTATION DIGIT LEFT FIFTH TOE;  Surgeon: Angelia Mould, MD;  Location: [place];  Service: Vascular;  Laterality: Left;  . AMPUTATION Bilateral 12/29/2018   Procedure: AMPUTATION ABOVE KNEE;  Surgeon: Waynetta Sandy, MD;  Location: [place];  Service: Vascular;  Laterality: Bilateral;  . APPLICATION OF WOUND VAC Right 02/17/2019   Procedure: Application Of Wound Vac;  Surgeon: Marty Heck, MD;  Location: [place];  Service: Vascular;  Laterality: Right;  . BVT  [id number]   Left  Basilic Vein Transposition  . CHOLECYSTECTOMY    . EYE SURGERY     Catarct bil  . I & D EXTREMITY Right 02/17/2019  Procedure: Right above the kneee debridement;  Surgeon: Marty Heck, MD;  Location: [place];  Service: Vascular;  Laterality: Right;  . INSERTION OF DIALYSIS CATHETER  05/28/2012   Procedure: INSERTION OF DIALYSIS CATHETER;  Surgeon: Mal Misty, MD;  Location: [place];  Service: Vascular;  Laterality: Right;  . INSERTION OF DIALYSIS CATHETER Right 01/02/2020   Procedure: INSERTION OF DIALYSIS CATHETER RIGHT INTERNAL JUGULAR;  Surgeon: Elam Dutch, MD;  Location: [place];  Service: Vascular;  Laterality: Right;  .  INSERTION OF DIALYSIS CATHETER Right 01/03/2020   Procedure: INSERTION OF DIALYSIS CATHETER RIGHT INTERNAL JUGULAR;  Surgeon: Rosetta Posner, MD;  Location: [place];  Service: Vascular;  Laterality: Right;  . Left arm shuntogram.    . Left forearm loop graft with 6 mm Gore-Tex graft.    . LOWER EXTREMITY ANGIOGRAPHY Bilateral 11/15/2018   Procedure: LOWER EXTREMITY ANGIOGRAPHY;  Surgeon: [name], MD;  Location: [place] [facility];  Service: Cardiovascular;  Laterality: Bilateral;  . Pars plana vitrectomy with 25-gauge system    . PERIPHERAL VASCULAR ATHERECTOMY Left 11/15/2018   Procedure: PERIPHERAL VASCULAR ATHERECTOMY;  Surgeon: [name], MD;  Location: [place] [facility];  Service: Cardiovascular;  Laterality: Left;  SFA with STENT  . PERIPHERAL VASCULAR BALLOON ANGIOPLASTY Left 11/15/2018   Procedure: PERIPHERAL VASCULAR BALLOON ANGIOPLASTY;  Surgeon: [name], MD;  Location: [place] [facility];  Service: Cardiovascular;  Laterality: Left;  PT  . REVISON OF ARTERIOVENOUS FISTULA Left 01/02/2020   Procedure: REVISON OF LEFT ARM ARTERIOVENOUS FISTULA;  Surgeon: Elam Dutch, MD;  Location: [place];  Service: Vascular;  Laterality: Left;         Allergies  Allergen Reactions  . Occlusive Silicone Sheets [Silicone] Other (See Comments)    "Allergic," per MAR  . Other Other (See Comments)    Unknown reaction to Occlusive adhesive- "Allergic," per MAR  . Sulfa Antibiotics   . Tape Itching and Other (See Comments)    Use Cloth tape only, please      MEDICATIONS     Medication Sig  . acetaminophen (TYLENOL) 500 MG tablet Take 1,000 mg by mouth 2 (two) times daily.   . Amino Acids-Protein Hydrolys (FEEDING SUPPLEMENT, PRO-STAT SUGAR FREE 64,) LIQD Take 30 mLs by mouth 2 (two) times daily. 9a and 5p  . ascorbic acid (VITAMIN C) 500 MG tablet Take 500 mg by mouth daily.  Marland Kitchen atorvastatin (LIPITOR) 20 MG tablet Take 20 mg by mouth daily.  .  Cholecalciferol (VITAMIN D3) 1.25 MG (50000 UT) CAPS Take 50,000 Units by mouth every Friday.   . dicyclomine (BENTYL) 20 MG tablet Take 20 mg by mouth 3 (three) times daily before meals.  . divalproex (DEPAKOTE SPRINKLE) 125 MG capsule Take 125-250 mg by mouth See admin instructions. Take 125mg  in the AM and 250mg  in the PM.  . divalproex (DEPAKOTE SPRINKLE) 125 MG capsule Take 250 mg by mouth at bedtime.  Marland Kitchen doxercalciferol (HECTOROL) 4 MCG/2ML injection Inject 2 mcg into the vein every Monday, Wednesday, and Friday with hemodialysis.  Marland Kitchen HYDROcodone-acetaminophen (NORCO) 5-325 MG tablet Take 1 tablet by mouth every 6 (six) hours as needed for moderate pain.  Marland Kitchen HYDROcodone-acetaminophen (NORCO/VICODIN) 5-325 MG tablet Take 1 tablet by mouth 2 (two) times daily as needed for moderate pain.  Marland Kitchen loperamide (IMODIUM A-D) 2 MG tablet Take 4 mg by mouth See admin instructions. Take 2 tablets ( 4 mg) by mouth after 1st loose stool  as needed for diarrhea  . metoprolol tartrate (LOPRESSOR) 25 MG tablet Take 25 mg by mouth See admin instructions. On Mon, Wed, Fri take 25mg  at bedtime. On all other days take 25mg  twice a day.  . midodrine (PROAMATINE) 10 MG tablet Take one tablet (10 mg) by mouth on Monday, Wednesday, Friday 30 minutes prior to dialysis.  Marland Kitchen MODERNA COVID-19 VACCINE 100 MCG/0.5ML SUSP Inject 0.5 mLs into the muscle once.  . multivitamin (RENA-VIT) TABS tablet Take 1 tablet by mouth at bedtime.  . Nutritional Supplements (ADULT NUTRITIONAL SUPPLEMENT PO) Take 1 each by mouth daily with supper. Magic Cup with dinner  . Nutritional Supplements (NEPRO PO) Take 1 Can by mouth daily.   . pantoprazole (PROTONIX) 40 MG tablet Take 1 tablet (40 mg total) by mouth daily.  . sevelamer carbonate (RENVELA) 800 MG tablet Take 1,600 mg by mouth 3 (three) times daily with meals.  . zinc sulfate 220 (50 Zn) MG capsule Take 220 mg by mouth every other day.  . [DISCONTINUED] warfarin (COUMADIN) 2.5 MG tablet Take  2.5 mg by mouth daily at 6 PM.    No facility-administered encounter medications on file as of 01/04/2020.    Review of Systems.  Limited secondary to dementia nursing does not report any issues   In general no complaints of fever or chills.  Skin no complaints of rashes or itching has had dialysis shunt catheter recently replaced.  Head ears eyes nose mouth and throat does not complain of visual changes no sore throat.  Respiratory no notification of increased shortness of breath or cough.  Cardiac no complaints of chest pain or edema.  GI no complaints of nausea vomiting diarrhea constipation.  GU he is end-stage renal disease no complaints of dysuria.  Musculoskeletal is status post AKA bilaterally does not complain of joint pain.  Neurologic no noted complaints of dizziness headache syncope numbness.  And psych history of dementia behaviors apparently have stabilized    Physical exam.  Temperature 97.3 pulse 70 respirations 18 blood pressure 130/70.  In general this is a pleasant elderly male in no distress lying comfortably in bed he is bright and alert.  His skin is warm and dry right thorax dialysis catheter appears unremarkable he also has a shunt with a bruit left upper arm.  Eyes visual acuity appears to be intact.  Oropharynx clear mucous membranes moist.-She has numerous extractions.    Chest is clear to auscultation there is no labored breathing.  Heart is irregular irregular rate and rhythm with a baseline systolic murmur.  Abdomen is soft nontender with positive bowel sounds.  Musculoskeletal positive for bilateral AKA's-moves his upper extremities at baseline.  Neurologic appears grossly intact cranial nerves intact his speech is clear   Psych findings consistent with dementia-she does follow simple verbal commands.  Labs. Recent Labs    02/20/19 1300 02/27/19 0000 05/28/19 0253 05/29/19 0541 05/29/19 2316 05/30/19 0727 05/30/19 0727  05/31/19 1319 07/27/19 1529 08/16/19 1818 01/02/20 0841 01/03/20 1106  NA 137   < > 137  --   --  137   < > 136 138 136 140 138  K 3.0*  --  3.7  --   --  5.1  --  3.6 3.5 4.0 3.6 3.7  CL 101   < > 102  --   --  101  --  98 95* 97* 95* 101  CO2 23   < > 21*  --   --  17*  --  24 26 25   --   --   GLUCOSE 101*   < > 151*  --   --  99  --  140* 143* 75 120* 102*  BUN 27*   < > 80*  --   --  158*   < > 44* 48* 28* 28* 38*  CREATININE 3.41*   < > 6.12*  --   --  8.32*   < > 4.29* 4.61* 3.59* 3.80* 5.90*  CALCIUM 7.7*   < > 9.3  --   --  10.0  --  9.1 10.2 9.5  --   --   MG  --    < > 2.0 2.1 2.4  --   --   --   --   --   --   --   PHOS 2.4*  --   --   --   --  2.6  --  3.0  --   --   --   --    < > = values in this interval not displayed.     Recent Labs (within last 365 days)         Recent Labs    05/27/19 0348 05/28/19 0253 05/30/19 0727 05/31/19 1319 08/16/19 1818  AST 55* 31  --   --  54*  ALT 55* 42  --   --  38  ALKPHOS 272* 261*  --   --  229*  BILITOT 0.8 0.6  --   --  1.2  PROT 7.0 6.5  --   --  7.8  ALBUMIN 2.4* 2.2* 2.3* 2.3* 2.9*     Recent Labs (within last 365 days)           Recent Labs    02/27/19 0000 05/31/19 1319 06/06/19 0000 07/27/19 1529 08/16/19 1818 01/02/20 0841 01/03/20 1106  WBC  --  10.3 11.5 18.6* 6.9  --   --   NEUTROABS  --   --  8 15.5* 4.4  --   --   HGB   < > 7.6* 9.3* 12.4* 11.0* 11.9* 10.2*  HCT   < > 24.7* 29* 40.2 34.8* 35.0* 30.0*  MCV  --  88.5  --  89.3 89.5  --   --   PLT   < > 220 251 171 193  --   --    < > = values in this interval not displayed.     Recent Labs       Lab Results  Component Value Date   TSH 1.839 05/25/2019     Recent Labs       Lab Results  Component Value Date   HGBA1C 4.7 09/08/2019     Assessment plan.  1.  History of end-stage renal disease on chronic dialysis-this apparently is well-tolerated he does continue on midodrine on dialysis days.  He recently had catheter and  shunt replaced-revised this appears to be stable after apparently some complications initially with patient pulling his catheter out.  2.  History of atrial fibrillation this appears rate controlled on Lopressor he continues on Coumadin it appears 3 mg Monday Wednesday Friday 2.5 mg all other days-update INR is pending.  3.  History of dementia with behaviors he is on Depakote 125 mg in the morning 250 mg at night this appears to be stable at this point.  4.  History of hypertension continues on Lopressor he is on midodrine on dialysis days because of hypotension concerns-recent blood pressures appear  to be stable 130/70-130/78-120/70-140/71.  5.  History of CHF fluid status is managed by dialysis this appears stable clinically.  6.  History of GERD he continues on Protonix this appears to be pretty well controlled.  [id number]

## 2020-01-19 LAB — HEMOGLOBIN A1C: Hemoglobin A1C: 4

## 2020-01-20 ENCOUNTER — Encounter: Payer: Self-pay | Admitting: Internal Medicine

## 2020-01-20 LAB — HEMOGLOBIN A1C: Hemoglobin A1C: 3.9

## 2020-02-02 ENCOUNTER — Non-Acute Institutional Stay (SKILLED_NURSING_FACILITY): Payer: Medicare Other | Admitting: Internal Medicine

## 2020-02-02 ENCOUNTER — Encounter: Payer: Self-pay | Admitting: Internal Medicine

## 2020-02-02 DIAGNOSIS — Z7901 Long term (current) use of anticoagulants: Secondary | ICD-10-CM

## 2020-02-02 DIAGNOSIS — I480 Paroxysmal atrial fibrillation: Secondary | ICD-10-CM | POA: Diagnosis not present

## 2020-02-02 NOTE — Progress Notes (Signed)
Location:  [facility] Room Number: 414-D Place of Service:  SNF (31)  Edward Duos, MD  Patient Care Team: Edward Duos, MD as PCP - General (Internal Medicine) [name], MD as Consulting Physician (Nephrology) Rehab, [place] ([place], [facility]  Extended Emergency Contact Information Primary Emergency Contact: [name],[name] Address: [place], [place] [address] [name] of [place] Phone: [phone] Work Phone: [phone] Mobile Phone: [phone] Relation: Daughter Secondary Emergency Contact: [place] Phone: [phone] Mobile Phone: [phone] Relation: Other    Allergies: Occlusive silicone sheets [silicone], Other, Sulfa antibiotics, and Tape  Chief Complaint  Patient presents with  . Anticoagulation    INR management    HPI: Patient is a 79 y.o. male who   Past Medical History:  Diagnosis Date  . A-fib ([place])   . Anemia   . Aortic stenosis   . Blood transfusion   . BPH (benign prostatic hyperplasia)   . CHF (congestive heart failure) ([place])   . Diarrhea   . DM (diabetes mellitus) ([place])    Type II-   not on medication   . ESRD on hemodialysis ([place])    Started dialysis in 2009  . History of GI bleed    secondary to coumadin  . HTN (hypertension)   . Hyperlipidemia   . OSA (obstructive sleep apnea)    uses CPAP  . Sacral wound    stage II - healed, has a protective dressing on- 12/28/2019  . Secondary hyperparathyroidism of renal origin [facility])     Past Surgical History:  Procedure Laterality Date  . ABDOMINAL AORTOGRAM N/A 11/15/2018   Procedure: ABDOMINAL AORTOGRAM;  Surgeon: [name], MD;  Location: [place] CV LAB;  Service: Cardiovascular;  Laterality: N/A;  . AMPUTATION Left 12/06/2018   Procedure: AMPUTATION DIGIT LEFT FIFTH TOE;  Surgeon: Angelia Mould, MD;  Location: [place];  Service:  Vascular;  Laterality: Left;  . AMPUTATION Bilateral 12/29/2018   Procedure: AMPUTATION ABOVE KNEE;  Surgeon: Waynetta Sandy, MD;  Location: [place];  Service: Vascular;  Laterality: Bilateral;  . APPLICATION OF WOUND VAC Right 02/17/2019   Procedure: Application Of Wound Vac;  Surgeon: Marty Heck, MD;  Location: [facility];  Service: Vascular;  Laterality: Right;  . BVT  [id number]   Left  Basilic Vein Transposition  . CHOLECYSTECTOMY    . EYE SURGERY     Catarct bil  . I & D EXTREMITY Right 02/17/2019   Procedure: Right above the kneee debridement;  Surgeon: Marty Heck, MD;  Location: [facility];  Service: Vascular;  Laterality: Right;  . INSERTION OF DIALYSIS CATHETER  05/28/2012   Procedure: INSERTION OF DIALYSIS CATHETER;  Surgeon: Mal Misty, MD;  Location: [facility];  Service: Vascular;  Laterality: Right;  . INSERTION OF DIALYSIS CATHETER Right 01/02/2020   Procedure: INSERTION OF DIALYSIS CATHETER RIGHT INTERNAL JUGULAR;  Surgeon: Elam Dutch, MD;  Location: [place];  Service: Vascular;  Laterality: Right;  . INSERTION OF DIALYSIS CATHETER Right 01/03/2020   Procedure: INSERTION OF DIALYSIS CATHETER RIGHT INTERNAL JUGULAR;  Surgeon: Rosetta Posner, MD;  Location: [facility];  Service: Vascular;  Laterality: Right;  . Left arm shuntogram.    . Left forearm loop graft with 6 mm Gore-Tex graft.    . LOWER EXTREMITY ANGIOGRAPHY Bilateral 11/15/2018   Procedure: LOWER EXTREMITY ANGIOGRAPHY;  Surgeon:  [name], MD;  Location: [place] CV LAB;  Service: Cardiovascular;  Laterality: Bilateral;  . Pars plana vitrectomy with 25-gauge system    . PERIPHERAL VASCULAR ATHERECTOMY Left 11/15/2018   Procedure: PERIPHERAL VASCULAR ATHERECTOMY;  Surgeon: [name], MD;  Location: [place] CV LAB;  Service: Cardiovascular;  Laterality: Left;  SFA with STENT  . PERIPHERAL VASCULAR BALLOON ANGIOPLASTY Left 11/15/2018   Procedure: PERIPHERAL VASCULAR BALLOON ANGIOPLASTY;   Surgeon: [name], MD;  Location: [place] CV LAB;  Service: Cardiovascular;  Laterality: Left;  PT  . REVISON OF ARTERIOVENOUS FISTULA Left 01/02/2020   Procedure: REVISON OF LEFT ARM ARTERIOVENOUS FISTULA;  Surgeon: Elam Dutch, MD;  Location: [facility];  Service: Vascular;  Laterality: Left;    Allergies as of 02/02/2020      Reactions   Occlusive Silicone Sheets [silicone] Other (See Comments)   "Allergic," per [facility]   Other Other (See Comments)   Unknown reaction to Occlusive adhesive- "Allergic," per [facility]   Sulfa Antibiotics    Tape Itching, Other (See Comments)   Use Cloth tape only, please      Medication List       Accurate as of February 02, 2020 11:59 PM. If you have any questions, ask your nurse or doctor.        acetaminophen 500 MG tablet Commonly known as: TYLENOL Take 1,000 mg by mouth 2 (two) times daily.   ADULT NUTRITIONAL SUPPLEMENT PO Take 1 each by mouth daily with supper. Magic Cup with dinner   NEPRO PO Take 1 Can by mouth daily.   ascorbic acid 500 MG tablet Commonly known as: VITAMIN C Take 500 mg by mouth daily.   atorvastatin 20 MG tablet Commonly known as: LIPITOR Take 20 mg by mouth daily.   COUMADIN PO Take 3.5 mg by mouth every Friday. Take half of a 1 mg tablet along with a 3 mg tablet to = 3.5 mg What changed: Another medication with the same name was removed. Continue taking this medication, and follow the directions you see here. Changed by: Inocencio Homes, MD   warfarin 3 MG tablet Commonly known as: COUMADIN Take 3 mg by mouth See admin instructions. Take 3 mg daily except for Fridays What changed: Another medication with the same name was removed. Continue taking this medication, and follow the directions you see here. Changed by: Inocencio Homes, MD   dicyclomine 20 MG tablet Commonly known as: BENTYL Take 20 mg by mouth 3 (three) times daily before meals.   divalproex 125 MG capsule Commonly known as: DEPAKOTE  SPRINKLE Take 125-250 mg by mouth See admin instructions. Take 125mg  in the AM and 250mg  in the PM.   divalproex 125 MG capsule Commonly known as: DEPAKOTE SPRINKLE Take 250 mg by mouth at bedtime.   feeding supplement (PRO-STAT SUGAR FREE 64) Liqd Take 30 mLs by mouth 2 (two) times daily. 9a and 5p   Hectorol 4 MCG/2ML injection Generic drug: doxercalciferol Inject 2 mcg into the vein every Monday, Wednesday, and Friday with hemodialysis.   HYDROcodone-acetaminophen 5-325 MG tablet Commonly known as: Norco Take 1 tablet by mouth every 6 (six) hours as needed for moderate pain.   HYDROcodone-acetaminophen 5-325 MG tablet Commonly known as: NORCO/VICODIN Take 1 tablet by mouth 2 (two) times daily as needed for moderate pain.   loperamide 2 MG tablet Commonly known as: IMODIUM A-D Take 4 mg by mouth See admin instructions. Take 2 tablets ( 4 mg) by  mouth after 1st loose stool as needed for diarrhea   metoprolol tartrate 25 MG tablet Commonly known as: LOPRESSOR Take 25 mg by mouth See admin instructions. On Mon, Wed, Fri take 25mg  at bedtime. On all other days take 25mg  twice a day.   midodrine 10 MG tablet Commonly known as: PROAMATINE Take one tablet (10 mg) by mouth on Monday, Wednesday, Friday 30 minutes prior to dialysis.   Moderna COVID-19 Vaccine 100 MCG/0.5ML Susp Generic drug: COVID-19 mRNA vaccine (Moderna) Inject 0.5 mLs into the muscle once.   multivitamin Tabs tablet Take 1 tablet by mouth at bedtime.   pantoprazole 40 MG tablet Commonly known as: PROTONIX Take 1 tablet (40 mg total) by mouth daily.   sevelamer carbonate 800 MG tablet Commonly known as: RENVELA Take 1,600 mg by mouth 3 (three) times daily with meals.   Vitamin D3 1.25 MG (50000 UT) Caps Take 50,000 Units by mouth every Friday.   zinc sulfate 220 (50 Zn) MG capsule Take 220 mg by mouth every other day.       No orders of the defined types were placed in this  encounter.   Immunization History  Administered Date(s) Administered  . Influenza-Unspecified 08/29/2019  . Pneumococcal Polysaccharide-23 10/07/2019    Social History   Tobacco Use  . Smoking status: Former Smoker    Types: Cigarettes    Quit date: 06/30/2004    Years since quitting: 15.6  . Smokeless tobacco: Never Used  Substance Use Topics  . Alcohol use: No     Vitals:   02/02/20 1528  BP: (!) 97/49  Pulse: 84  Resp: 17  Temp: (!) 97.1 F (36.2 C)  SpO2: 98%   Body mass index is 64.29 kg/m.   Patient Active Problem List   Diagnosis Date Noted  . Anticoagulated on Coumadin 12/24/2019  . Infection due to ESBL-producing Escherichia coli 08/12/2019  . MRSA cellulitis 08/12/2019  . Sacral decubitus ulcer, stage IV ([place]) 08/05/2019  . Infected decubitus ulcer, stage IV ([place]) 08/05/2019  . Hypertensive heart and kidney disease with chronic diastolic congestive heart failure and stage 5 chronic kidney disease on chronic dialysis ([place]) 03/24/2019  . End stage renal disease on dialysis due to type 2 diabetes mellitus ([place]) 03/24/2019  . 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 ([place]) 03/24/2019  . Anemia due to end stage renal disease ([place]) 03/24/2019  . Protein-calorie malnutrition, severe ([place]) 03/24/2019  . Hyperlipidemia associated with type 2 diabetes mellitus ([place]) 02/22/2019  . Amputation stump infection ([place]) 02/15/2019  . Conjunctivitis of left eye 01/04/2019  . 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[place]) 12/30/2018  . Open wound of left foot   . Encounter for monitoring Coumadin therapy   . OSA (obstructive sleep apnea)   . ESRD on hemodialysis ([place])   . BPH (benign prostatic hyperplasia)   . Hypotension 12/07/2018  . GERD (gastroesophageal reflux disease) 12/07/2018  . Pressure injury of skin 12/03/2018  . Symptomatic anemia 12/02/2018  . Acute on chronic blood loss anemia 12/02/2018  . 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[place]) 12/02/2018  . Lower  GI bleeding 12/02/2018  . Heme positive stool   . Acute on chronic diastolic (congestive) heart failure ([place]) 11/19/2018  . Acute CVA (cerebrovascular accident) ([place]) 11/19/2018  . Hypoglycemia 11/19/2018  . Peripheral vascular disease due to secondary diabetes ([place]) 11/19/2018  . Dry gangrene ([place]) 11/19/2018  . 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 DNR (do not resuscitate) discussion   .  Goals of care, counseling/discussion   . Palliative care by specialist   . Acute hypoxemic respiratory failure ([place]) 11/06/2018  . Volume overload 11/06/2018  . Multifocal pneumonia 11/06/2018  . Acute metabolic encephalopathy [id number]  . Physical deconditioning 11/06/2018  . Acute respiratory failure with hypoxia and hypercapnia ([place]) 11/04/2018  . CAP (community acquired pneumonia) 11/04/2018  . Pleural effusion, right 11/04/2018  . Increased ammonia level 11/04/2018  . Abnormal CT of the abdomen 11/04/2018  . Atrial fibrillation with RVR ([place]) 11/04/2018  . Sepsis ([place]) 10/21/2018  . Acute encephalopathy 10/21/2018  . Elevated alkaline phosphatase level 10/21/2018  . Chronic anemia 10/21/2018  . Thrombocytopenia ([place]) 10/21/2018  . Pulmonary HTN ([place]) 03/04/2017  . Aortic valve stenosis 03/04/2017  . Hypertension, accelerated with heart disease, without CHF 01/12/2017  . Dyspnea on exertion 01/12/2017  . ESRD (end stage renal disease) ([place]) 06/07/2012  . Hyperlipidemia 04/24/2011  . HYPERTENSION, BENIGN 10/24/2009  . Type 2 diabetes mellitus with hypertension and end stage renal disease on dialysis ([place]) 10/23/2009  . OBSTRUCTIVE SLEEP APNEA 10/23/2009  . PAF (paroxysmal atrial fibrillation) ([place]) 10/23/2009  . CHF (congestive heart failure) ([place]) 10/23/2009    CMP     Component Value Date/Time   NA 138 01/03/2020 1106   NA 143 06/06/2019 0000   K 3.7 01/03/2020 1106   CL 101 01/03/2020 1106   CO2 25 08/16/2019 1818   GLUCOSE 102 (H) 01/03/2020 1106   BUN  38 (H) 01/03/2020 1106   BUN 44 (A) 06/06/2019 0000   CREATININE 5.90 (H) 01/03/2020 1106   CALCIUM 9.5 08/16/2019 1818   CALCIUM 8.3 (L) 10/25/2008 1450   PROT 7.8 08/16/2019 1818   ALBUMIN 2.9 (L) 08/16/2019 1818   AST 54 (H) 08/16/2019 1818   ALT 38 08/16/2019 1818   ALKPHOS 229 (H) 08/16/2019 1818   BILITOT 1.2 08/16/2019 1818   GFRNONAA 15 (L) 08/16/2019 1818   GFRAA 18 (L) 08/16/2019 1818   Recent Labs    02/20/19 1300 02/27/19 0000 05/28/19 0253 05/28/19 0253 05/29/19 0541 05/29/19 2316 05/30/19 0727 05/30/19 0727 05/31/19 1319 06/06/19 0000 07/27/19 1529 07/27/19 1529 08/16/19 1818 01/02/20 0841 01/03/20 1106  NA 137   < > 137   < >  --   --  137   < > 136   < > 138   < > 136 140 138  K 3.0*   < > 3.7   < >  --   --  5.1   < > 3.6   < > 3.5   < > 4.0 3.6 3.7  CL 101   < > 102   < >  --   --  101   < > 98  --  95*   < > 97* 95* 101  CO2 23   < > 21*   < >  --   --  17*   < > 24  --  26  --  25  --   --   GLUCOSE 101*   < > 151*   < >  --   --  99   < > 140*  --  143*   < > 75 120* 102*  BUN 27*   < > 80*   < >  --   --  158*   < > 44*   < > 48*   < > 28* 28* 38*  CREATININE 3.41*   < > 6.12*   < >  --   --  8.32*   < > 4.29*   < > 4.61*   < > 3.59* 3.80* 5.90*  CALCIUM 7.7*   < > 9.3   < >  --   --  10.0   < > 9.1  --  10.2  --  9.5  --   --   MG  --    < > 2.0  --  2.1 2.4  --   --   --   --   --   --   --   --   --   PHOS 2.4*  --   --   --   --   --  2.6  --  3.0  --   --   --   --   --   --    < > = values in this interval not displayed.   Recent Labs    05/27/19 0348 05/27/19 0348 05/28/19 0253 05/28/19 0253 05/30/19 0727 05/31/19 1319 08/16/19 1818  AST 55*  --  31  --   --   --  54*  ALT 55*  --  42  --   --   --  38  ALKPHOS 272*  --  261*  --   --   --  229*  BILITOT 0.8  --  0.6  --   --   --  1.2  PROT 7.0  --  6.5  --   --   --  7.8  ALBUMIN 2.4*   < > 2.2*   < > 2.3* 2.3* 2.9*   < > = values in this interval not displayed.   Recent Labs     02/27/19 0000 05/31/19 1319 06/06/19 0000 06/06/19 0000 07/27/19 1529 07/27/19 1529 08/16/19 1818 01/02/20 0841 01/03/20 1106  WBC   < > 10.3 11.5  --  18.6*  --  6.9  --   --   NEUTROABS  --   --  8  --  15.5*  --  4.4  --   --   HGB   < > 7.6* 9.3*   < > 12.4*   < > 11.0* 11.9* 10.2*  HCT   < > 24.7* 29*   < > 40.2   < > 34.8* 35.0* 30.0*  MCV  --  88.5  --   --  89.3  --  89.5  --   --   PLT   < > 220 251  --  171  --  193  --   --    < > = values in this interval not displayed.   No results for input(s): CHOL, LDLCALC, TRIG in the last 8760 hours.  Invalid input(s): HCL No results found for: [facility] Lab Results  Component Value Date   TSH 1.839 05/25/2019   Lab Results  Component Value Date   HGBA1C 3.9 01/20/2020   Lab Results  Component Value Date   CHOL 113 11/11/2018   HDL 39 (L) 11/11/2018   LDLCALC 62 11/11/2018   TRIG 60 11/11/2018   CHOLHDL 2.9 11/11/2018    Significant Diagnostic Results in last 30 days:  No results found.  Assessment and Plan  Paroxysmal atrial fibrillation/anticoagulated on Coumadin-patient's INR is 1.9 which she has only been on current regimen for 3 days from 2/1; will increase from 30 mg daily to 3.5 mg on Friday and 3 mg every other day and repeat INR on 2/8 which will give me 1 week of new  dosing to look at    [name] , MD

## 2020-02-05 ENCOUNTER — Encounter: Payer: Self-pay | Admitting: Internal Medicine

## 2020-02-07 ENCOUNTER — Encounter: Payer: Self-pay | Admitting: Internal Medicine

## 2020-02-07 ENCOUNTER — Non-Acute Institutional Stay (SKILLED_NURSING_FACILITY): Payer: Medicare Other | Admitting: Internal Medicine

## 2020-02-07 DIAGNOSIS — I480 Paroxysmal atrial fibrillation: Secondary | ICD-10-CM

## 2020-02-07 DIAGNOSIS — N186 End stage renal disease: Secondary | ICD-10-CM

## 2020-02-07 DIAGNOSIS — Z89611 Acquired absence of right leg above knee: Secondary | ICD-10-CM

## 2020-02-07 DIAGNOSIS — I5032 Chronic diastolic (congestive) heart failure: Secondary | ICD-10-CM

## 2020-02-07 DIAGNOSIS — E1122 Type 2 diabetes mellitus with diabetic chronic kidney disease: Secondary | ICD-10-CM | POA: Diagnosis not present

## 2020-02-07 DIAGNOSIS — Z89612 Acquired absence of left leg above knee: Secondary | ICD-10-CM

## 2020-02-07 DIAGNOSIS — I119 Hypertensive heart disease without heart failure: Secondary | ICD-10-CM | POA: Diagnosis not present

## 2020-02-07 DIAGNOSIS — Z992 Dependence on renal dialysis: Secondary | ICD-10-CM

## 2020-02-07 NOTE — Progress Notes (Signed)
Location:    [place] Room Number: 414/D Place of Service:  SNF [phone]) Provider:  Leda Min, MD  Patient Care Team: Hennie Duos, MD as PCP - General (Internal Medicine) [name], MD as Consulting Physician (Nephrology) Rehab, [facility], [facility]  Extended Emergency Contact Information Primary Emergency Contact: [name],[name] Address: [address] [place], [place] [address] [name] of [place] Phone: [phone] Work Phone: [phone] Mobile Phone: [phone] Relation: Daughter Secondary Emergency Contact: [place] Phone: [phone] Mobile Phone: [phone] Relation: Other  Code Status:  DNR Goals of care: Advanced Directive information Advanced Directives 02/07/2020  Does Patient Have a Medical Advance Directive? Yes  Type of Advance Directive Out of facility DNR (pink MOST or yellow form)  Does patient want to make changes to medical advance directive? No - Patient declined  Copy of [place] in Chart? -  Would patient like information on creating a medical advance directive? -  Pre-existing out of facility DNR order (yellow form or pink MOST form) Yellow form placed in chart (order not valid for inpatient use);Pink MOST form placed in chart (order not valid for inpatient use)     Chief Complaint  Patient presents with  . 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 male seen today for medical management of chronic diseases.  As noted above.  He continues on dialysis Monday Wednesdays and Fridays and  this appears to be fairly well tolerated.  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  In regards to dementia he is on Depakote twice a day for apparent behaviors and these are stable per nursing staff they do not report any recent behaviors.  He also has a history of CHF and this is managed with dialysis.  In regards to hypertension he is on Lopressor he is on it twice a day on nondialysis days and once a day on dialysis days he also has midodrine and on dialysis days for concerns of hypotension.  Blood pressures appear to be stable recently with systolics ranging from the  90s to 150s I do not see consistent lows or elevations.-He does have orders for midodrine on dialysis days secondary to history of hypotension  Regards to atrial fibrillation he is on Lopressor he continues on Coumadin update INR is pending.  He also has a history of gastric motility issues he is on bentyl 20 mg 3 times daily-pharmacy has made a recommendation to possibly consider dose reduction-but per discussion with nursing they say this has been well-tolerated and effective and have significant concerns there will be some issues if this is reduced.  He is also on Protonix.  Currently he is sitting in his chair comfortably he is getting ready to go to dialysis.  He does have a recent history of dialysis catheter replacement and actually had to go back to the hospital to get put back in since apparently was pulled out by patient when he returned to the facility.  This has not been an issue recently however.     Past Medical History:  Diagnosis Date  . A-fib ([place])   . Anemia   . Aortic stenosis   . Blood transfusion   . BPH (benign prostatic hyperplasia)   . CHF (congestive heart failure) ([place])   . Diarrhea   . 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 ESRD on  hemodialysis ([place])    Started dialysis in 2009  . History of GI bleed    secondary to coumadin  . HTN (hypertension)   . Hyperlipidemia   . OSA (obstructive sleep apnea)    uses CPAP  . Sacral wound    stage II - healed, has a protective dressing on- 12/28/2019  . Secondary hyperparathyroidism of renal origin [facility])    Past Surgical History:  Procedure Laterality Date  . ABDOMINAL AORTOGRAM N/A 11/15/2018   Procedure: ABDOMINAL AORTOGRAM;  Surgeon: [name], MD;  Location: [place] CV LAB;  Service: Cardiovascular;  Laterality: N/A;  . AMPUTATION Left 12/06/2018   Procedure: AMPUTATION DIGIT LEFT FIFTH TOE;  Surgeon: Angelia Mould, MD;  Location: [place];  Service: Vascular;  Laterality: Left;  . AMPUTATION Bilateral 12/29/2018   Procedure: AMPUTATION ABOVE KNEE;  Surgeon: Waynetta Sandy, MD;  Location: [place];  Service: Vascular;  Laterality: Bilateral;  . APPLICATION OF WOUND VAC Right 02/17/2019   Procedure: Application Of Wound Vac;  Surgeon: Marty Heck, MD;  Location: [place];  Service: Vascular;  Laterality: Right;  . BVT  123456   Left  Basilic Vein Transposition  . CHOLECYSTECTOMY    . EYE SURGERY     Catarct bil  . I & D EXTREMITY Right 02/17/2019   Procedure: Right above the kneee debridement;  Surgeon: Marty Heck, MD;  Location: [place];  Service: Vascular;  Laterality: Right;  . INSERTION OF DIALYSIS CATHETER  05/28/2012   Procedure: INSERTION OF DIALYSIS CATHETER;  Surgeon: Mal Misty, MD;  Location: [place];  Service: Vascular;  Laterality: Right;  . INSERTION OF DIALYSIS CATHETER Right 01/02/2020   Procedure: INSERTION OF DIALYSIS CATHETER RIGHT INTERNAL JUGULAR;  Surgeon: Elam Dutch, MD;  Location: [place];  Service: Vascular;  Laterality: Right;  . INSERTION OF DIALYSIS CATHETER Right 01/03/2020   Procedure: INSERTION OF DIALYSIS CATHETER RIGHT INTERNAL JUGULAR;  Surgeon: Rosetta Posner, MD;  Location: [place];  Service:  Vascular;  Laterality: Right;  . Left arm shuntogram.    . Left forearm loop graft with 6 mm Gore-Tex graft.    . LOWER EXTREMITY ANGIOGRAPHY Bilateral 11/15/2018   Procedure: LOWER EXTREMITY ANGIOGRAPHY;  Surgeon: [name], MD;  Location: [place] [facility];  Service: Cardiovascular;  Laterality: Bilateral;  . Pars plana vitrectomy with 25-gauge system    . PERIPHERAL VASCULAR ATHERECTOMY Left 11/15/2018   Procedure: PERIPHERAL VASCULAR ATHERECTOMY;  Surgeon: [name], MD;  Location: [place] [facility];  Service: Cardiovascular;  Laterality: Left;  SFA with STENT  . PERIPHERAL VASCULAR BALLOON ANGIOPLASTY Left 11/15/2018   Procedure: PERIPHERAL VASCULAR BALLOON ANGIOPLASTY;  Surgeon: [name], MD;  Location: [place] [facility];  Service: Cardiovascular;  Laterality: Left;  PT  . REVISON OF ARTERIOVENOUS FISTULA Left 01/02/2020   Procedure: REVISON OF LEFT ARM ARTERIOVENOUS FISTULA;  Surgeon: Elam Dutch, MD;  Location: [place];  Service: Vascular;  Laterality: Left;    Allergies  Allergen Reactions  .  Occlusive Silicone Sheets [Silicone] Other (See Comments)    "Allergic," per MAR  . Other Other (See Comments)    Unknown reaction to Occlusive adhesive- "Allergic," per MAR  . Sulfa Antibiotics   . Tape Itching and Other (See Comments)    Use Cloth tape only, please    Allergies as of 02/07/2020      Reactions   Occlusive Silicone Sheets [silicone] Other (See Comments)   "Allergic," per [facility]   Other Other (See Comments)   Unknown reaction to Occlusive adhesive- "Allergic," per MAR   Sulfa Antibiotics    Tape Itching, Other (See Comments)   Use Cloth tape only, please      Medication List       Accurate as of February 07, 2020 10:02 AM. If you have any questions, ask your nurse or doctor.        STOP taking these medications   Moderna COVID-19 Vaccine 100 MCG/0.5ML Susp Generic drug: COVID-19 mRNA vaccine (Moderna) Stopped by: Granville Lewis, PA-C      TAKE these medications   acetaminophen 500 MG tablet Commonly known as: TYLENOL Take 1,000 mg by mouth 2 (two) times daily.   ADULT NUTRITIONAL SUPPLEMENT PO Take 1 each by mouth daily with supper. Magic Cup with dinner   NEPRO PO Take 1 Can by mouth 2 (two) times daily.   ascorbic acid 500 MG tablet Commonly known as: VITAMIN C Take 500 mg by mouth daily.   atorvastatin 20 MG tablet Commonly known as: LIPITOR Take 20 mg by mouth daily.   dicyclomine 20 MG tablet Commonly known as: BENTYL Take 20 mg by mouth 3 (three) times daily before meals.   divalproex 125 MG capsule Commonly known as: DEPAKOTE SPRINKLE Take 125-250 mg by mouth See admin instructions. Take 125mg  in the AM and 250mg  in the PM.   divalproex 125 MG capsule Commonly known as: DEPAKOTE SPRINKLE Take 250 mg by mouth at bedtime.   feeding supplement (PRO-STAT SUGAR FREE 64) Liqd Take 30 mLs by mouth 2 (two) times daily. 9a and 5p   Hectorol 4 MCG/2ML injection Generic drug: doxercalciferol Inject 2 mcg into the vein every Monday, Wednesday, and Friday with hemodialysis.   HYDROcodone-acetaminophen 5-325 MG tablet Commonly known as: NORCO/VICODIN Take 1 tablet by mouth 2 (two) times daily. For Pain   loperamide 2 MG tablet Commonly known as: IMODIUM A-D Take 4 mg by mouth See admin instructions. Take 2 tablets ( 4 mg) by mouth after 1st loose stool as needed for diarrhea   metoprolol tartrate 25 MG tablet Commonly known as: LOPRESSOR Give 1 tablet by mouth at bedtime on Mon Wed ,Fri   metoprolol tartrate 25 MG tablet Commonly known as: LOPRESSOR Give 1 tablet by mouth twice daily on Tues ,Thur ,Sat ,Sun   midodrine 10 MG tablet Commonly known as: PROAMATINE Take one tablet (10 mg) by mouth on Monday, Wednesday, Friday 30 minutes prior to dialysis.   multivitamin Tabs tablet Take 1 tablet by mouth at bedtime.   pantoprazole 40 MG tablet Commonly known as: PROTONIX Take 1 tablet (40 mg  total) by mouth daily.   sevelamer carbonate 800 MG tablet Commonly known as: RENVELA Take 1,600 mg by mouth 3 (three) times daily with meals.   Vitamin D3 1.25 MG (50000 UT) Caps Take 50,000 Units by mouth every Friday.   warfarin 3 MG tablet Commonly known as: COUMADIN Take 1 tablet PO On MON WED FRI along with 0.5 mg to =3.5 What changed:  Another medication with the same name was removed. Continue taking this medication, and follow the directions you see here. Changed by: Granville Lewis, PA-C   warfarin 1 MG tablet Commonly known as: COUMADIN Take 1/2 tablet (0.5mg ) PO along with 3 mg for total of 3.5mg  on MON WED FRI What changed: Another medication with the same name was removed. Continue taking this medication, and follow the directions you see here. Changed by: Granville Lewis, PA-C   zinc sulfate 220 (50 Zn) MG capsule Take 220 mg by mouth every other day.       Review of Systems   Is is limited secondary to dementia.  In general is not complaining of any fever or chills.  Skin does not complain of rashes itching.  Head ears eyes nose mouth and throat does not complain of visual changes or sore throat.  Respiratory does not complain of being short of breath or having a cough.  Cardiac no complaints of chest pain or edema.  GI is not complaining of any abdominal discomfort no nausea vomiting diarrhea constipation complaints.  GU does have been history of end-stage renal disease on dialysis does not complain of any suprapubic pain or dysuria.  Musculoskeletal does have a history of bilateral AKA's does not complain of pain at this point.  Neurologic does not complain of being dizzy feeling weak or having a headache.  And psych does have some history of dementia does not complain of being depressed or anxious.    Immunization History  Administered Date(s) Administered  . Influenza-Unspecified 08/29/2019  . Moderna SARS-COVID-2 Vaccination 12/28/2019, 01/25/2020    . Pneumococcal Polysaccharide-23 10/07/2019   Pertinent  Health Maintenance Due  Topic Date Due  . FOOT EXAM  11/01/1951  . HEMOGLOBIN A1C  07/19/2020  . PNA vac Low Risk Adult (2 of 2 - PCV13) 10/06/2020  . OPHTHALMOLOGY EXAM  12/18/2020  . INFLUENZA VACCINE  Completed  . URINE MICROALBUMIN  Discontinued   No flowsheet data found. Functional Status Survey:    Vitals:   02/07/20 0933  BP: 127/70  Pulse: 86  Resp: 18  Temp: 97.6 F (36.4 C)  TempSrc: Oral  SpO2: 98%  Weight: 120 lb 1.6 oz (54.5 kg)  Height: 3' (0.914 m)   Body mass index is 65.15 kg/m. Physical Exam   In general this is a pleasant elderly male in no distress sitting comfortably in his wheelchair.  The skin is warm and dry he does have a dialysis catheter right thorax which appears unremarkable as well as a shunt in the left upper arm with positive bruit.  Eyes visual acuity appears to be intact.  Oropharynx is clear mucous membranes moist she has numerous extractions.  Chest is clear to auscultation there is no labored breathing.  Heart is regular irregular rate and rhythm with a baseline systolic murmur.  Abdomen soft nontender with positive bowel sounds.  Musculoskeletal is status post bilateral AKA's does move his upper extremities with baseline strength and range of motion.  Neurologic is grossly intact cannot appreciate lateralizing findings.  Psych he is pleasant and appropriate he does have some cognitive deficits but does well with supportive care-follows verbal instructions without difficulty    Labs reviewed: Recent Labs    02/20/19 1300 02/27/19 0000 05/28/19 0253 05/28/19 0253 05/29/19 0541 05/29/19 2316 05/30/19 0727 05/30/19 0727 05/31/19 1319 06/06/19 0000 07/27/19 1529 07/27/19 1529 08/16/19 1818 01/02/20 0841 01/03/20 1106  NA 137   < > 137   < >  --   --  137   < > 136   < > 138   < > 136 140 138  K 3.0*   < > 3.7   < >  --   --  5.1   < > 3.6   < > 3.5   < >  4.0 3.6 3.7  CL 101   < > 102   < >  --   --  101   < > 98  --  95*   < > 97* 95* 101  CO2 23   < > 21*   < >  --   --  17*   < > 24  --  26  --  25  --   --   GLUCOSE 101*   < > 151*   < >  --   --  99   < > 140*  --  143*   < > 75 120* 102*  BUN 27*   < > 80*   < >  --   --  158*   < > 44*   < > 48*   < > 28* 28* 38*  CREATININE 3.41*   < > 6.12*   < >  --   --  8.32*   < > 4.29*   < > 4.61*   < > 3.59* 3.80* 5.90*  CALCIUM 7.7*   < > 9.3   < >  --   --  10.0   < > 9.1  --  10.2  --  9.5  --   --   MG  --    < > 2.0  --  2.1 2.4  --   --   --   --   --   --   --   --   --   PHOS 2.4*  --   --   --   --   --  2.6  --  3.0  --   --   --   --   --   --    < > = values in this interval not displayed.   Recent Labs    05/27/19 0348 05/27/19 0348 05/28/19 0253 05/28/19 0253 05/30/19 0727 05/31/19 1319 08/16/19 1818  AST 55*  --  31  --   --   --  54*  ALT 55*  --  42  --   --   --  38  ALKPHOS 272*  --  261*  --   --   --  229*  BILITOT 0.8  --  0.6  --   --   --  1.2  PROT 7.0  --  6.5  --   --   --  7.8  ALBUMIN 2.4*   < > 2.2*   < > 2.3* 2.3* 2.9*   < > = values in this interval not displayed.   Recent Labs    02/27/19 0000 05/31/19 1319 06/06/19 0000 06/06/19 0000 07/27/19 1529 07/27/19 1529 08/16/19 1818 01/02/20 0841 01/03/20 1106  WBC   < > 10.3 11.5  --  18.6*  --  6.9  --   --   NEUTROABS  --   --  8  --  15.5*  --  4.4  --   --   HGB   < > 7.6* 9.3*   < > 12.4*   < > 11.0* 11.9* 10.2*  HCT   < > 24.7* 29*   < >  40.2   < > 34.8* 35.0* 30.0*  MCV  --  88.5  --   --  89.3  --  89.5  --   --   PLT   < > 220 251  --  171  --  193  --   --    < > = values in this interval not displayed.   Lab Results  Component Value Date   TSH 1.839 05/25/2019   Lab Results  Component Value Date   HGBA1C 3.9 01/20/2020   Lab Results  Component Value Date   CHOL 113 11/11/2018   HDL 39 (L) 11/11/2018   LDLCALC 62 11/11/2018   TRIG 60 11/11/2018   CHOLHDL 2.9 11/11/2018     Significant Diagnostic Results in last 30 days:  No results found.  Assessment/Plan #1 history of end-stage renal disease he continues on hemodialysis Monday Wednesdays and Fridays this appears to be well-tolerated.  2.  History of peripheral vascular disease status post bilateral AKA's he continues on Norco 5-325 mg twice daily he also has orders for Tylenol 1000 mg twice daily at this point he appears to be comfortable.  3  History of hypertension this appears stable with systolics ranging from the high 90s to 150s recently do not see consistent highs or lows.  He does have midodrine and on dialysis days-he continues on Lopressor twice daily on nondialysis days and daily on dialysis days-it is 25 mg.  4-history of atrial fibrillation this appears rate controlled on the Lopressor he is on Coumadin for anticoagulation INR is pending.  5.  History of GERD he continues on Protonix as well as Bentyl--20 mg 3 times daily-as noted above he has been stable on this dose-per discussion with nursing staff would be hesitant to reduce dose secondary to concerns of symptoms significantly worsening.  6.  History of dementia at this point continue supportive care he is on Depakote twice daily 125 mg earlier in the day and 250 mg at night.  7.  History of CHF at this point appears to be stable fluid status is managed by dialysis.  [id number]

## 2020-02-09 ENCOUNTER — Encounter: Payer: Self-pay | Admitting: Internal Medicine

## 2020-02-27 ENCOUNTER — Other Ambulatory Visit: Payer: Self-pay | Admitting: Internal Medicine

## 2020-02-27 MED ORDER — HYDROCODONE-ACETAMINOPHEN 5-325 MG PO TABS: 1.0000 | ORAL_TABLET | Freq: Two times a day (BID) | ORAL | 0 refills | Status: DC

## 2020-03-07 ENCOUNTER — Non-Acute Institutional Stay (SKILLED_NURSING_FACILITY): Payer: Medicare Other | Admitting: Internal Medicine

## 2020-03-07 ENCOUNTER — Encounter: Payer: Self-pay | Admitting: Internal Medicine

## 2020-03-07 DIAGNOSIS — I480 Paroxysmal atrial fibrillation: Secondary | ICD-10-CM

## 2020-03-07 DIAGNOSIS — Z7901 Long term (current) use of anticoagulants: Secondary | ICD-10-CM

## 2020-03-07 NOTE — Progress Notes (Signed)
Location:    [facility] Room Number: 414/D Place of Service:  SNF [phone]) Provider:  Henreitta Leber, MD  Patient Care Team: Hennie Duos, MD as PCP - General (Internal Medicine) [name], MD as Consulting Physician (Nephrology) Rehab, [facility], [facility]  Extended Emergency Contact Information Primary Emergency Contact: [name],[name] Address: [place], [place] [address] [name] of [place] Phone: [phone] Work Phone: [phone] Mobile Phone: [phone] Relation: Daughter Secondary Emergency Contact: [place] Phone: [phone] Mobile Phone: [phone] Relation: Other  Code Status:  DNR Goals of care: Advanced Directive information Advanced Directives 03/07/2020  Does Patient Have a Medical Advance Directive? Yes  Type of Advance Directive Out of facility DNR (pink MOST or yellow form)  Does patient want to make changes to medical advance directive? No - Patient declined  Copy of [place] in Chart? -  Would patient like information on creating a medical advance directive? -  Pre-existing out of facility DNR order (yellow form or pink MOST form) Yellow form placed in chart (order not valid for inpatient use);Pink MOST form placed in chart (order not valid for inpatient use)     Chief Complaint  Patient presents with  . Anticoagulation    Anticcoagulation Management  With history of atrial fibrillation on chronic Coumadin  HPI:  Pt is a 79 y.o. male seen today for an acute visit for anticoagulation management-he is on chronic Coumadin with a history of atrial fibrillation.  INR today is 1.8-it was 2.5 back on March 8-on March 4 it was 1.7 and Coumadin was increased to 3.5 mg a day and it did rise up to 2.5 on the eighth however it is 1.8 today-per nursing he has not missed any doses.  Currently  he is in his room in his wheelchair resting comfortably he does not have any complaints-she is a poor historian secondary to dementia-nursing does not report any issues there has been no reports of any increased bruising or bleeding.  He is on Lopressor for rate control.     Past Medical History:  Diagnosis Date  . A-fib ([place])   . Anemia   . Aortic stenosis   . Blood transfusion   . BPH (benign prostatic hyperplasia)   . CHF (congestive heart failure) ([place])   . Diarrhea   . 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 ESRD on hemodialysis ([place])    Started dialysis in 2009  . History of GI bleed    secondary to coumadin  . HTN (hypertension)   . Hyperlipidemia   . OSA (obstructive sleep apnea)    uses CPAP  . Sacral wound    stage II - healed, has a protective dressing on- 12/28/2019  . Secondary hyperparathyroidism of renal origin [facility])    Past Surgical History:  Procedure Laterality Date  . ABDOMINAL AORTOGRAM N/A 11/15/2018   Procedure: ABDOMINAL AORTOGRAM;  Surgeon: [name], MD;  Location: [facility];  Service: Cardiovascular;  Laterality: N/A;  . AMPUTATION Left 12/06/2018   Procedure: AMPUTATION DIGIT LEFT FIFTH TOE;  Surgeon: Angelia Mould, MD;  Location: [facility];  Service: Vascular;  Laterality: Left;  . AMPUTATION Bilateral 12/29/2018   Procedure: AMPUTATION ABOVE KNEE;  Surgeon: Waynetta Sandy, MD;  Location: [facility];  Service:  Vascular;  Laterality: Bilateral;  . APPLICATION OF WOUND VAC Right 02/17/2019   Procedure: Application Of Wound Vac;  Surgeon: Marty Heck, MD;  Location: [facility];  Service: Vascular;  Laterality: Right;  . BVT  0/34/74   Left  Basilic Vein Transposition  . CHOLECYSTECTOMY    . EYE SURGERY     Catarct bil  . I & D EXTREMITY Right 02/17/2019   Procedure: Right above the kneee debridement;  Surgeon: Marty Heck, MD;  Location: [facility];  Service: Vascular;  Laterality: Right;   . INSERTION OF DIALYSIS CATHETER  05/28/2012   Procedure: INSERTION OF DIALYSIS CATHETER;  Surgeon: Mal Misty, MD;  Location: [facility];  Service: Vascular;  Laterality: Right;  . INSERTION OF DIALYSIS CATHETER Right 01/02/2020   Procedure: INSERTION OF DIALYSIS CATHETER RIGHT INTERNAL JUGULAR;  Surgeon: Elam Dutch, MD;  Location: [facility];  Service: Vascular;  Laterality: Right;  . INSERTION OF DIALYSIS CATHETER Right 01/03/2020   Procedure: INSERTION OF DIALYSIS CATHETER RIGHT INTERNAL JUGULAR;  Surgeon: Rosetta Posner, MD;  Location: [facility];  Service: Vascular;  Laterality: Right;  . Left arm shuntogram.    . Left forearm loop graft with 6 mm Gore-Tex graft.    . LOWER EXTREMITY ANGIOGRAPHY Bilateral 11/15/2018   Procedure: LOWER EXTREMITY ANGIOGRAPHY;  Surgeon: [name], MD;  Location: [facility];  Service: Cardiovascular;  Laterality: Bilateral;  . Pars plana vitrectomy with 25-gauge system    . PERIPHERAL VASCULAR ATHERECTOMY Left 11/15/2018   Procedure: PERIPHERAL VASCULAR ATHERECTOMY;  Surgeon: [name], MD;  Location: [facility];  Service: Cardiovascular;  Laterality: Left;  SFA with STENT  . PERIPHERAL VASCULAR BALLOON ANGIOPLASTY Left 11/15/2018   Procedure: PERIPHERAL VASCULAR BALLOON ANGIOPLASTY;  Surgeon: [name], MD;  Location: [facility];  Service: Cardiovascular;  Laterality: Left;  PT  . REVISON OF ARTERIOVENOUS FISTULA Left 01/02/2020   Procedure: REVISON OF LEFT ARM ARTERIOVENOUS FISTULA;  Surgeon: Elam Dutch, MD;  Location: [facility];  Service: Vascular;  Laterality: Left;    Allergies  Allergen Reactions  . Occlusive Silicone Sheets [Silicone] Other (See Comments)    "Allergic," per MAR  . Other Other (See Comments)    Unknown reaction to Occlusive adhesive- "Allergic," per MAR  . Sulfa Antibiotics   . Tape Itching and Other (See Comments)    Use Cloth tape only, please    Outpatient Encounter Medications as of  03/07/2020  Medication Sig  . acetaminophen (TYLENOL) 500 MG tablet Take 1,000 mg by mouth 2 (two) times daily.   . Amino Acids-Protein Hydrolys (FEEDING SUPPLEMENT, PRO-STAT SUGAR FREE 64,) LIQD Take 30 mLs by mouth 2 (two) times daily. 9a and 5p  . ascorbic acid (VITAMIN C) 500 MG tablet Take 500 mg by mouth daily.  Marland Kitchen atorvastatin (LIPITOR) 20 MG tablet Take 20 mg by mouth daily.  . Cholecalciferol (VITAMIN D3) 1.25 MG (50000 UT) CAPS Take 50,000 Units by mouth every Friday.   . dicyclomine (BENTYL) 20 MG tablet Take 20 mg by mouth 3 (three) times daily before meals.  . divalproex (DEPAKOTE SPRINKLE) 125 MG capsule Take 125-250 mg by mouth See admin instructions. Take 125mg  in the AM and 250mg  in the PM.  . divalproex (DEPAKOTE SPRINKLE) 125 MG capsule Take 250 mg by mouth at bedtime.  Marland Kitchen doxercalciferol (HECTOROL) 4 MCG/2ML injection Inject 2 mcg into the vein every Monday, Wednesday, and Friday with hemodialysis.  Marland Kitchen HYDROcodone-acetaminophen (  NORCO/VICODIN) 5-325 MG tablet Take 1 tablet by mouth 2 (two) times daily. For Pain  . loperamide (IMODIUM A-D) 2 MG tablet Take 4 mg by mouth See admin instructions. Take 2 tablets ( 4 mg) by mouth after 1st loose stool as needed for diarrhea  . metoprolol tartrate (LOPRESSOR) 25 MG tablet Give 1 tablet by mouth at bedtime on Mon Wed ,Fri  . metoprolol tartrate (LOPRESSOR) 25 MG tablet Give 1 tablet by mouth twice daily on Tues ,Thur ,Sat ,Sun  . midodrine (PROAMATINE) 10 MG tablet Take one tablet (10 mg) by mouth on Monday, Wednesday, Friday 30 minutes prior to dialysis.  Marland Kitchen multivitamin (RENA-VIT) TABS tablet Take 1 tablet by mouth at bedtime.  . Nutritional Supplements (ADULT NUTRITIONAL SUPPLEMENT PO) Take 1 each by mouth daily with supper. Magic Cup with dinner  . Nutritional Supplements (NEPRO PO) Take 1 Can by mouth 2 (two) times daily.   . pantoprazole (PROTONIX) 40 MG tablet Take 1 tablet (40 mg total) by mouth daily.  . sevelamer carbonate  (RENVELA) 800 MG tablet Take 1,600 mg by mouth 3 (three) times daily with meals.  . warfarin (COUMADIN) 3 MG tablet Take 1 tablet PO On FRI and Sun along with 0.5 mg to =3.5  . warfarin (COUMADIN) 4 MG tablet Take 4 mg by mouth. On Thursday and Saturday  . zinc sulfate 220 (50 Zn) MG capsule Take 220 mg by mouth every other day.  . [DISCONTINUED] warfarin (COUMADIN) 1 MG tablet Take 1/2 tablet (0.5mg ) PO along with 3 mg for total of 3.5mg  on MON WED FRI   No facility-administered encounter medications on file as of 03/07/2020.    Review of Systems.  This is somewhat limited since patient is somewhat poor historian-provided by nursing as well.  General is not complaining of any fever or chills.  Skin no complaints of rashes or itching or increased bruising.  Head ears eyes nose mouth and throat Does not complain of visual changes or sore throat.  Respiratory no complaints of cough or shortness of breath.  Cardiac does not complain of chest pain or increasing edema.  GI does not complain of abdominal discomfort nausea vomiting diarrhea constipation.  GU he does have a history of end-stage renal disease on hemodialysis-no complaints of dysuria.  Musculoskeletal is status post bilateral above-the-knee amputations does not complain of pain however.  Neurologic does not complain of dizziness headache numbness or syncopal type feelings.  And psych does have some element of dementia but he is not complain of being depressed or anxious appears to be bright alert in good spirits.   Immunization History  Administered Date(s) Administered  . Influenza-Unspecified 08/29/2019  . Moderna SARS-COVID-2 Vaccination 12/28/2019, 01/25/2020  . Pneumococcal Polysaccharide-23 10/07/2019   Pertinent  Health Maintenance Due  Topic Date Due  . FOOT EXAM  Never done  . HEMOGLOBIN A1C  07/19/2020  . PNA vac Low Risk Adult (2 of 2 - PCV13) 10/06/2020  . OPHTHALMOLOGY EXAM  12/18/2020  . INFLUENZA  VACCINE  Completed  . URINE MICROALBUMIN  Discontinued   No flowsheet data found. Functional Status Survey:    Vitals:   03/07/20 1540  BP: 127/63  Pulse: 73  Resp: 18  Temp: (!) 96.9 F (36.1 C)  TempSrc: Oral  SpO2: 96%  Weight: 117 lb 1.6 oz (53.1 kg)  Height: 3' (0.914 m)   Body mass index is 63.53 kg/m. Physical Exam   In general this is a pleasant elderly male in no  distress sitting comfortably in his wheelchair.  His skin is warm and dry.  He does have a dialysis catheter right thorax this appears unremarkable he also has a shunt in his left upper arm with a positive bruit.  I do not note any evidence of increased bruising  Eyes visual acuity appears to be intact.  Oropharynx is clear his mucous membranes moist he has numerous extractions at baseline.  Chest is clear to auscultation without labored breathing.  Heart is regular rate and rhythm with a systolic murmur which is baseline.  His abdomen is soft nontender with positive bowel sounds.  Musculoskeletal does move upper extremities at baseline he is status post bilateral AKA's.  Neurologic appears grossly intact speech is clear-could not appreciate lateralizing findings.  Psych he is pleasant appropriate does follow simple verbal commands    Labs reviewed:  March 07, 2020.  INR 1.8.  March 04, 2020.  INR 2.5.  February 29, 2020.  INR 1.7  Recent Labs    05/28/19 0253 05/28/19 0253 05/29/19 0541 05/29/19 2316 05/30/19 0727 05/30/19 0727 05/31/19 1319 06/06/19 0000 07/27/19 1529 07/27/19 1529 08/16/19 1818 01/02/20 0841 01/03/20 1106  NA 137   < >  --   --  137   < > 136   < > 138   < > 136 140 138  K 3.7   < >  --   --  5.1   < > 3.6   < > 3.5   < > 4.0 3.6 3.7  CL 102   < >  --   --  101   < > 98  --  95*   < > 97* 95* 101  CO2 21*   < >  --   --  17*   < > 24  --  26  --  25  --   --   GLUCOSE 151*   < >  --   --  99   < > 140*  --  143*   < > 75 120* 102*  BUN 80*   < >  --   --   158*   < > 44*   < > 48*   < > 28* 28* 38*  CREATININE 6.12*   < >  --   --  8.32*   < > 4.29*   < > 4.61*   < > 3.59* 3.80* 5.90*  CALCIUM 9.3   < >  --   --  10.0   < > 9.1  --  10.2  --  9.5  --   --   MG 2.0  --  2.1 2.4  --   --   --   --   --   --   --   --   --   PHOS  --   --   --   --  2.6  --  3.0  --   --   --   --   --   --    < > = values in this interval not displayed.   Recent Labs    05/27/19 0348 05/27/19 0348 05/28/19 0253 05/28/19 0253 05/30/19 0727 05/31/19 1319 08/16/19 1818  AST 55*  --  31  --   --   --  54*  ALT 55*  --  42  --   --   --  38  ALKPHOS 272*  --  261*  --   --   --  229*  BILITOT 0.8  --  0.6  --   --   --  1.2  PROT 7.0  --  6.5  --   --   --  7.8  ALBUMIN 2.4*   < > 2.2*   < > 2.3* 2.3* 2.9*   < > = values in this interval not displayed.   Recent Labs    05/31/19 1319 05/31/19 1319 06/06/19 0000 06/06/19 0000 07/27/19 1529 07/27/19 1529 08/16/19 1818 01/02/20 0841 01/03/20 1106  WBC 10.3  --  11.5  --  18.6*  --  6.9  --   --   NEUTROABS  --   --  8  --  15.5*  --  4.4  --   --   HGB 7.6*   < > 9.3*   < > 12.4*   < > 11.0* 11.9* 10.2*  HCT 24.7*   < > 29*   < > 40.2   < > 34.8* 35.0* 30.0*  MCV 88.5  --   --   --  89.3  --  89.5  --   --   PLT 220   < > 251  --  171  --  193  --   --    < > = values in this interval not displayed.   Lab Results  Component Value Date   TSH 1.839 05/25/2019   Lab Results  Component Value Date   HGBA1C 3.9 01/20/2020   Lab Results  Component Value Date   CHOL 113 11/11/2018   HDL 39 (L) 11/11/2018   LDLCALC 62 11/11/2018   TRIG 60 11/11/2018   CHOLHDL 2.9 11/11/2018    Significant Diagnostic Results in last 30 days:  No results found.  Assessment/Plan  #1 history of atrial fibrillation on chronic Coumadin for anticoagulation-INR slightly subtherapeutic today-it has become normalized at 3.5 mg a day but now has dropped slightly-we will give 4 mg of Coumadin tonight and Saturday  continue 3.5 mg all other days and will recheck an INR on Monday, March 15 clinically appears to be stable.  Rate appears to be controlled on Lopressor 25 mg twice daily on nondialysis days and 25 mg nightly on Monday Wednesdays and Fridays when he receives dialysis.  [id number]

## 2020-03-08 ENCOUNTER — Encounter: Payer: Self-pay | Admitting: Internal Medicine

## 2020-03-13 LAB — VITAMIN D 25 HYDROXY (VIT D DEFICIENCY, FRACTURES): Vit D, 25-Hydroxy: 60

## 2020-03-18 ENCOUNTER — Encounter: Payer: Self-pay | Admitting: Internal Medicine

## 2020-03-18 NOTE — Progress Notes (Signed)
Location:    [facility] Room Number: 414/D Place of Service:  SNF [id number]) Provider:  Leda Min, MD  Patient Care Team: Hennie Duos, MD as PCP - General (Internal Medicine) [name], MD as Consulting Physician (Nephrology) Rehab, [facility], [facility]  Extended Emergency Contact Information Primary Emergency Contact: [name],[name] Address: [place], [place] [address] [name] of [place] Phone: [phone] Work Phone: [phone] Mobile Phone: [phone] Relation: Daughter Secondary Emergency Contact: [place] Phone: [phone] Mobile Phone: [phone] Relation: Other  Code Status:  DNR Goals of care: Advanced Directive information Advanced Directives 03/18/2020  Does Patient Have a Medical Advance Directive? Yes  Type of Advance Directive Out of facility DNR (pink MOST or yellow form)  Does patient want to make changes to medical advance directive? No - Patient declined  Copy of [place] in Chart? -  Would patient like information on creating a medical advance directive? -  Pre-existing out of facility DNR order (yellow form or pink MOST form) Yellow form placed in chart (order not valid for inpatient use);Pink MOST form placed in chart (order not valid for inpatient use)     Chief Complaint  Patient presents with  . Medical Management of Chronic Issues    Routine visit of medical mangement    HPI:  Pt is a 79 y.o. male seen today for medical management of chronic diseases.     Past Medical History:  Diagnosis Date  . A-fib ([place])   . Anemia   . Aortic stenosis   . Blood transfusion   . BPH (benign prostatic hyperplasia)   . CHF (congestive heart failure) ([place])   . Diarrhea   . 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 ESRD on hemodialysis ([place])    Started dialysis in 2009  . History of GI bleed    secondary to coumadin  . HTN (hypertension)   . Hyperlipidemia   . OSA (obstructive sleep apnea)    uses CPAP  . Sacral wound    stage II - healed, has a protective dressing on- 12/28/2019  . Secondary hyperparathyroidism of renal origin [facility])    Past Surgical History:  Procedure Laterality Date  . ABDOMINAL AORTOGRAM N/A 11/15/2018   Procedure: ABDOMINAL AORTOGRAM;  Surgeon: [name], MD;  Location: [place] CV LAB;  Service: Cardiovascular;  Laterality: N/A;  . AMPUTATION Left 12/06/2018   Procedure: AMPUTATION DIGIT LEFT FIFTH TOE;  Surgeon: Angelia Mould, MD;  Location: [place];  Service: Vascular;  Laterality: Left;  . AMPUTATION Bilateral 12/29/2018   Procedure: AMPUTATION ABOVE KNEE;  Surgeon: Waynetta Sandy, MD;  Location: [place];  Service: Vascular;  Laterality: Bilateral;  . APPLICATION OF WOUND VAC Right 02/17/2019   Procedure: Application Of Wound Vac;  Surgeon: Marty Heck, MD;  Location: [place];  Service: Vascular;  Laterality: Right;  . BVT  8/88/28   Left  Basilic Vein Transposition  . CHOLECYSTECTOMY    . EYE SURGERY     Catarct bil  . I & D EXTREMITY Right 02/17/2019   Procedure: Right above the kneee debridement;  Surgeon: Marty Heck, MD;  Location: [place];  Service: Vascular;  Laterality: Right;  . INSERTION OF DIALYSIS CATHETER  05/28/2012   Procedure: INSERTION OF DIALYSIS  CATHETER;  Surgeon: Mal Misty, MD;  Location: [place];  Service: Vascular;  Laterality: Right;  . INSERTION OF DIALYSIS CATHETER Right 01/02/2020   Procedure: INSERTION OF DIALYSIS CATHETER RIGHT INTERNAL JUGULAR;  Surgeon: Elam Dutch, MD;  Location: [place];  Service: Vascular;  Laterality: Right;  . INSERTION OF DIALYSIS CATHETER Right 01/03/2020   Procedure: INSERTION OF DIALYSIS CATHETER RIGHT INTERNAL JUGULAR;  Surgeon: Rosetta Posner, MD;  Location: [facility];  Service: Vascular;  Laterality: Right;    . Left arm shuntogram.    . Left forearm loop graft with 6 mm Gore-Tex graft.    . LOWER EXTREMITY ANGIOGRAPHY Bilateral 11/15/2018   Procedure: LOWER EXTREMITY ANGIOGRAPHY;  Surgeon: [name], MD;  Location: [place] CV LAB;  Service: Cardiovascular;  Laterality: Bilateral;  . Pars plana vitrectomy with 25-gauge system    . PERIPHERAL VASCULAR ATHERECTOMY Left 11/15/2018   Procedure: PERIPHERAL VASCULAR ATHERECTOMY;  Surgeon: [name], MD;  Location: [place] CV LAB;  Service: Cardiovascular;  Laterality: Left;  SFA with STENT  . PERIPHERAL VASCULAR BALLOON ANGIOPLASTY Left 11/15/2018   Procedure: PERIPHERAL VASCULAR BALLOON ANGIOPLASTY;  Surgeon: [name], MD;  Location: [place] [facility] LAB;  Service: Cardiovascular;  Laterality: Left;  PT  . REVISON OF ARTERIOVENOUS FISTULA Left 01/02/2020   Procedure: REVISON OF LEFT ARM ARTERIOVENOUS FISTULA;  Surgeon: Elam Dutch, MD;  Location: [facility];  Service: Vascular;  Laterality: Left;    Allergies  Allergen Reactions  . Occlusive Silicone Sheets [Silicone] Other (See Comments)    "Allergic," per MAR  . Other Other (See Comments)    Unknown reaction to Occlusive adhesive- "Allergic," per MAR  . Sulfa Antibiotics   . Tape Itching and Other (See Comments)    Use Cloth tape only, please    Allergies as of 03/18/2020      Reactions   Occlusive Silicone Sheets [silicone] Other (See Comments)   "Allergic," per [facility]   Other Other (See Comments)   Unknown reaction to Occlusive adhesive- "Allergic," per MAR   Sulfa Antibiotics    Tape Itching, Other (See Comments)   Use Cloth tape only, please      Medication List       Accurate as of March 18, 2020 11:02 AM. If you have any questions, ask your nurse or doctor.        STOP taking these medications   ascorbic acid 500 MG tablet Commonly known as: VITAMIN C Stopped by: Granville Lewis, PA-C   zinc sulfate 220 (50 Zn) MG capsule Stopped by: Granville Lewis,  PA-C     TAKE these medications   acetaminophen 500 MG tablet Commonly known as: TYLENOL Take 1,000 mg by mouth 2 (two) times daily.   ADULT NUTRITIONAL SUPPLEMENT PO Take 1 each by mouth daily with supper. Magic Cup with dinner   NEPRO PO Take 1 Can by mouth 2 (two) times daily.   atorvastatin 20 MG tablet Commonly known as: LIPITOR Take 20 mg by mouth daily.   dicyclomine 20 MG tablet Commonly known as: BENTYL Take 20 mg by mouth 3 (three) times daily before meals.   divalproex 125 MG capsule Commonly known as: DEPAKOTE SPRINKLE TAKE 1 TABLET BY MOUTH EVERY MORNING FOR BEHAVIORS (DO NOT CRUSH);TAKE 2 TABLETS (250MG ) BY MOUTH AT BEDTIME   divalproex 125 MG capsule Commonly known as: DEPAKOTE SPRINKLE Take 250 mg by mouth at bedtime.   feeding supplement (PRO-STAT SUGAR FREE 64) Liqd Take 30  mLs by mouth 2 (two) times daily. 9a and 5p   Hectorol 4 MCG/2ML injection Generic drug: doxercalciferol Inject 2 mcg into the vein every Monday, Wednesday, and Friday with hemodialysis.   HYDROcodone-acetaminophen 5-325 MG tablet Commonly known as: NORCO/VICODIN Take 1 tablet by mouth 2 (two) times daily. For Pain   loperamide 2 MG tablet Commonly known as: IMODIUM A-D Take 4 mg by mouth See admin instructions. Take 2 tablets ( 4 mg) by mouth after 1st loose stool as needed for diarrhea   metoprolol tartrate 25 MG tablet Commonly known as: LOPRESSOR Give 1 tablet by mouth at bedtime on Mon Wed ,Fri   metoprolol tartrate 25 MG tablet Commonly known as: LOPRESSOR Give 1 tablet by mouth twice daily on Tues ,Thur ,Sat ,Sun   midodrine 10 MG tablet Commonly known as: PROAMATINE Take one tablet (10 mg) by mouth on Monday, Wednesday, Friday 30 minutes prior to dialysis.   multivitamin Tabs tablet Take 1 tablet by mouth at bedtime.   NON FORMULARY DIET :Regular NAS Diet with Double meat Portion,1200 ml fluid restriction. Diet liberalized to promote PO Intake.   pantoprazole  40 MG tablet Commonly known as: PROTONIX Take 1 tablet (40 mg total) by mouth daily.   sevelamer carbonate 800 MG tablet Commonly known as: RENVELA Take 1,600 mg by mouth 3 (three) times daily with meals.   Vitamin D3 1.25 MG (50000 UT) Caps Take 50,000 Units by mouth every Friday.   warfarin 3 MG tablet Commonly known as: COUMADIN Take 1 tablet PO daily along with 0.5 mg to =3.5   warfarin 1 MG tablet Commonly known as: COUMADIN Take 0.5 mg by mouth along with 3 mg to = 3.5 mg daily       Review of Systems  Immunization History  Administered Date(s) Administered  . Influenza-Unspecified 08/29/2019  . Moderna SARS-COVID-2 Vaccination 12/28/2019, 01/25/2020  . Pneumococcal Polysaccharide-23 10/07/2019   Pertinent  Health Maintenance Due  Topic Date Due  . FOOT EXAM  Never done  . HEMOGLOBIN A1C  07/19/2020  . PNA vac Low Risk Adult (2 of 2 - PCV13) 10/06/2020  . OPHTHALMOLOGY EXAM  12/18/2020  . INFLUENZA VACCINE  Completed  . URINE MICROALBUMIN  Discontinued   No flowsheet data found. Functional Status Survey:    Vitals:   03/18/20 1046  BP: (!) 141/65  Pulse: 69  Resp: 16  Temp: 98.4 F (36.9 C)  TempSrc: Oral  SpO2: 96%  Weight: 114 lb 3.2 oz (51.8 kg)  Height: 3' (0.914 m)   Body mass index is 61.95 kg/m. Physical Exam  Labs reviewed: Recent Labs    05/28/19 0253 05/28/19 0253 05/29/19 0541 05/29/19 2316 05/30/19 0727 05/30/19 0727 05/31/19 1319 06/06/19 0000 07/27/19 1529 07/27/19 1529 08/16/19 1818 01/02/20 0841 01/03/20 1106  NA 137   < >  --   --  137   < > 136   < > 138   < > 136 140 138  K 3.7   < >  --   --  5.1   < > 3.6   < > 3.5   < > 4.0 3.6 3.7  CL 102   < >  --   --  101   < > 98  --  95*   < > 97* 95* 101  CO2 21*   < >  --   --  17*   < > 24  --  26  --  25  --   --  GLUCOSE 151*   < >  --   --  99   < > 140*  --  143*   < > 75 120* 102*  BUN 80*   < >  --   --  158*   < > 44*   < > 48*   < > 28* 28* 38*  CREATININE  6.12*   < >  --   --  8.32*   < > 4.29*   < > 4.61*   < > 3.59* 3.80* 5.90*  CALCIUM 9.3   < >  --   --  10.0   < > 9.1  --  10.2  --  9.5  --   --   MG 2.0  --  2.1 2.4  --   --   --   --   --   --   --   --   --   PHOS  --   --   --   --  2.6  --  3.0  --   --   --   --   --   --    < > = values in this interval not displayed.   Recent Labs    05/27/19 0348 05/27/19 0348 05/28/19 0253 05/28/19 0253 05/30/19 0727 05/31/19 1319 08/16/19 1818  AST 55*  --  31  --   --   --  54*  ALT 55*  --  42  --   --   --  38  ALKPHOS 272*  --  261*  --   --   --  229*  BILITOT 0.8  --  0.6  --   --   --  1.2  PROT 7.0  --  6.5  --   --   --  7.8  ALBUMIN 2.4*   < > 2.2*   < > 2.3* 2.3* 2.9*   < > = values in this interval not displayed.   Recent Labs    05/31/19 1319 05/31/19 1319 06/06/19 0000 06/06/19 0000 07/27/19 1529 07/27/19 1529 08/16/19 1818 01/02/20 0841 01/03/20 1106  WBC 10.3  --  11.5  --  18.6*  --  6.9  --   --   NEUTROABS  --   --  8  --  15.5*  --  4.4  --   --   HGB 7.6*   < > 9.3*   < > 12.4*   < > 11.0* 11.9* 10.2*  HCT 24.7*   < > 29*   < > 40.2   < > 34.8* 35.0* 30.0*  MCV 88.5  --   --   --  89.3  --  89.5  --   --   PLT 220   < > 251  --  171  --  193  --   --    < > = values in this interval not displayed.   Lab Results  Component Value Date   TSH 1.839 05/25/2019   Lab Results  Component Value Date   HGBA1C 3.9 01/20/2020   Lab Results  Component Value Date   CHOL 113 11/11/2018   HDL 39 (L) 11/11/2018   LDLCALC 62 11/11/2018   TRIG 60 11/11/2018   CHOLHDL 2.9 11/11/2018    Significant Diagnostic Results in last 30 days:  No results found.  Assessment/Plan There are no diagnoses linked to this encounter.   Family/ staff Communication:  Labs/tests ordered:

## 2020-03-19 NOTE — Progress Notes (Signed)
This encounter was created in error - please disregard.

## 2020-03-25 ENCOUNTER — Encounter: Payer: Self-pay | Admitting: Internal Medicine

## 2020-03-25 ENCOUNTER — Non-Acute Institutional Stay (SKILLED_NURSING_FACILITY): Payer: Medicare Other | Admitting: Internal Medicine

## 2020-03-25 DIAGNOSIS — I480 Paroxysmal atrial fibrillation: Secondary | ICD-10-CM

## 2020-03-25 DIAGNOSIS — Z7901 Long term (current) use of anticoagulants: Secondary | ICD-10-CM | POA: Diagnosis not present

## 2020-03-25 NOTE — Progress Notes (Deleted)
Location:  [place] Room Number: 414-D Place of Service:  SNF (31)  Edward Duos, MD  Patient Care Team: Edward Duos, MD as PCP - General (Internal Medicine) [name], MD as Consulting Physician (Nephrology) Rehab, [place] ([place], [facility]  Extended Emergency Contact Information Primary Emergency Contact: [name],[name] Address: [place], [place] [address] [name] of [place] Phone: [phone] Work Phone: [phone] Mobile Phone: [phone] Relation: Daughter Secondary Emergency Contact: [place] Phone: [phone] Mobile Phone: [phone] Relation: Other    Allergies: Occlusive silicone sheets [silicone], Other, Sulfa antibiotics, and Tape  Chief Complaint  Patient presents with  . Medical Management of Chronic Issues    Routine [facility] visit    HPI: Patient is a 79 y.o. male who   Past Medical History:  Diagnosis Date  . A-fib ([place])   . Anemia   . Aortic stenosis   . Blood transfusion   . BPH (benign prostatic hyperplasia)   . CHF (congestive heart failure) ([place])   . Diarrhea   . 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 ESRD on hemodialysis ([place])    Started dialysis in 2009  . History of GI bleed    secondary to coumadin  . HTN (hypertension)   . Hyperlipidemia   . OSA (obstructive sleep apnea)    uses CPAP  . Sacral wound    stage II - healed, has a protective dressing on- 12/28/2019  . Secondary hyperparathyroidism of renal origin [facility])     Past Surgical History:  Procedure Laterality Date  . ABDOMINAL AORTOGRAM N/A 11/15/2018   Procedure: ABDOMINAL AORTOGRAM;  Surgeon: [name], MD;  Location: [place] CV LAB;  Service: Cardiovascular;  Laterality: N/A;  . AMPUTATION Left 12/06/2018   Procedure: AMPUTATION DIGIT LEFT FIFTH TOE;  Surgeon: Angelia Mould, MD;  Location: [place];  Service: Vascular;  Laterality: Left;  . AMPUTATION Bilateral 12/29/2018   Procedure: AMPUTATION ABOVE KNEE;  Surgeon: Waynetta Sandy, MD;  Location: [place];  Service: Vascular;  Laterality: Bilateral;  . APPLICATION OF WOUND VAC Right 02/17/2019   Procedure: Application Of Wound Vac;  Surgeon: Marty Heck, MD;  Location: [place];  Service: Vascular;  Laterality: Right;  . BVT  1/74/08   Left  Basilic Vein Transposition  . CHOLECYSTECTOMY    . EYE SURGERY     Catarct bil  . I & D EXTREMITY Right 02/17/2019   Procedure: Right above the kneee debridement;  Surgeon: Marty Heck, MD;  Location: [place];  Service: Vascular;  Laterality: Right;  . INSERTION OF DIALYSIS CATHETER  05/28/2012   Procedure: INSERTION OF DIALYSIS CATHETER;  Surgeon: Mal Misty, MD;  Location: [place];  Service: Vascular;  Laterality: Right;  . INSERTION OF DIALYSIS CATHETER Right 01/02/2020   Procedure: INSERTION OF DIALYSIS CATHETER RIGHT INTERNAL JUGULAR;  Surgeon: Elam Dutch, MD;  Location: [place];  Service: Vascular;  Laterality: Right;  . INSERTION OF DIALYSIS CATHETER Right 01/03/2020   Procedure: INSERTION OF DIALYSIS CATHETER RIGHT INTERNAL JUGULAR;  Surgeon: Rosetta Posner, MD;  Location: [place];  Service: Vascular;  Laterality: Right;  . Left arm shuntogram.    . Left forearm loop graft with 6 mm Gore-Tex graft.    . LOWER EXTREMITY ANGIOGRAPHY Bilateral 11/15/2018  Procedure: LOWER EXTREMITY ANGIOGRAPHY;  Surgeon: [name], MD;  Location: [place] CV LAB;  Service: Cardiovascular;  Laterality: Bilateral;  . Pars plana vitrectomy with 25-gauge system    . PERIPHERAL VASCULAR ATHERECTOMY Left 11/15/2018   Procedure: PERIPHERAL VASCULAR ATHERECTOMY;  Surgeon: [name], MD;  Location: [place] [facility];  Service: Cardiovascular;  Laterality: Left;  SFA with STENT  . PERIPHERAL VASCULAR BALLOON ANGIOPLASTY Left 11/15/2018   Procedure: PERIPHERAL  VASCULAR BALLOON ANGIOPLASTY;  Surgeon: [name], MD;  Location: [place] CV LAB;  Service: Cardiovascular;  Laterality: Left;  PT  . REVISON OF ARTERIOVENOUS FISTULA Left 01/02/2020   Procedure: REVISON OF LEFT ARM ARTERIOVENOUS FISTULA;  Surgeon: Elam Dutch, MD;  Location: [place];  Service: Vascular;  Laterality: Left;    Allergies as of 03/25/2020      Reactions   Occlusive Silicone Sheets [silicone] Other (See Comments)   "Allergic," per Procedure [facility]   Other Other (See Comments)   Unknown reaction to Occlusive adhesive- "Allergic," per MAR   Sulfa Antibiotics    Tape Itching, Other (See Comments)   Use Cloth tape only, please      Medication List       Accurate as of March 25, 2020  4:20 PM. If you have any questions, ask your nurse or doctor.        acetaminophen 500 MG tablet Commonly known as: TYLENOL Take 1,000 mg by mouth 2 (two) times daily.   ADULT NUTRITIONAL SUPPLEMENT PO Take 1 each by mouth daily with supper. Magic Cup with dinner   NEPRO PO Take 1 Can by mouth 2 (two) times daily.   atorvastatin 20 MG tablet Commonly known as: LIPITOR Take 20 mg by mouth daily.   dicyclomine 20 MG tablet Commonly known as: BENTYL Take 20 mg by mouth 3 (three) times daily before meals.   divalproex 125 MG capsule Commonly known as: DEPAKOTE SPRINKLE Take 125 mg by mouth in the morning.   divalproex 125 MG capsule Commonly known as: DEPAKOTE SPRINKLE Take 250 mg by mouth at bedtime.   feeding supplement (PRO-STAT SUGAR FREE 64) Liqd Take 30 mLs by mouth 2 (two) times daily. 9a and 5p   Hectorol 4 MCG/2ML injection Generic drug: doxercalciferol Inject 2 mcg into the vein every Monday, Wednesday, and Friday with hemodialysis.   HYDROcodone-acetaminophen 5-325 MG tablet Commonly known as: NORCO/VICODIN Take 1 tablet by mouth 2 (two) times daily. For Pain   loperamide 2 MG tablet Commonly known as: IMODIUM A-D Take 4 mg by mouth See admin instructions. Take  2 tablets ( 4 mg) by mouth after 1st loose stool as needed for diarrhea   metoprolol tartrate 25 MG tablet Commonly known as: LOPRESSOR Give 1 tablet by mouth at bedtime on Mon Wed ,Fri   metoprolol tartrate 25 MG tablet Commonly known as: LOPRESSOR Give 1 tablet by mouth twice daily on Tues ,Thur ,Sat ,Sun   midodrine 10 MG tablet Commonly known as: PROAMATINE Take one tablet (10 mg) by mouth on Monday, Wednesday, Friday 30 minutes prior to dialysis.   multivitamin Tabs tablet Take 1 tablet by mouth at bedtime.   NON FORMULARY DIET :Regular NAS Diet with Double meat Portion,1200 ml fluid restriction. Diet liberalized to promote PO Intake.   pantoprazole 40 MG tablet Commonly known as: PROTONIX Take 1 tablet (40 mg total) by mouth daily.   sevelamer carbonate 800 MG tablet Commonly known as: RENVELA Take 1,600 mg by mouth 3 (three)  times daily with meals.   Vitamin D3 1.25 MG (50000 UT) Caps Take 50,000 Units by mouth every Friday.   warfarin 3 MG tablet Commonly known as: COUMADIN Take 1 tablet PO daily along with 0.5 mg to =3.5   warfarin 1 MG tablet Commonly known as: COUMADIN Take 0.5 mg by mouth along with 3 mg to = 3.5 mg daily       No orders of the defined types were placed in this encounter.   Immunization History  Administered Date(s) Administered  . Influenza-Unspecified 08/29/2019  . Moderna SARS-COVID-2 Vaccination 12/28/2019, 01/25/2020  . Pneumococcal Polysaccharide-23 10/07/2019    Social History   Tobacco Use  . Smoking status: Former Smoker    Types: Cigarettes    Quit date: 06/30/2004    Years since quitting: 15.7  . Smokeless tobacco: Never Used  Substance Use Topics  . Alcohol use: No    Review of Systems  DATA OBTAINED: from patient, nurse, medical record, family member GENERAL:  no fevers, fatigue, appetite changes SKIN: No itching, rash HEENT: No complaint RESPIRATORY: No cough, wheezing, SOB CARDIAC: No chest pain,  palpitations, lower extremity edema  GI: No abdominal pain, No N/V/D or constipation, No heartburn or reflux  GU: No dysuria, frequency or urgency, or incontinence  MUSCULOSKELETAL: No unrelieved bone/joint pain NEUROLOGIC: No headache, dizziness  PSYCHIATRIC: No overt anxiety or sadness  Vitals:   03/25/20 1159  BP: 100/62  Pulse: 68  Resp: 16  Temp: (!) 97.5 F (36.4 C)  SpO2: 96%   Body mass index is 45.4 kg/m. Physical Exam  GENERAL APPEARANCE: Alert, conversant, No acute distress  SKIN: No diaphoresis rash HEENT: Unremarkable RESPIRATORY: Breathing is even, unlabored. Lung sounds are clear   CARDIOVASCULAR: Heart RRR no murmurs, rubs or gallops. No peripheral edema  GASTROINTESTINAL: Abdomen is soft, non-tender, not distended w/ normal bowel sounds.  GENITOURINARY: Bladder non tender, not distended  MUSCULOSKELETAL: No abnormal joints or musculature NEUROLOGIC: Cranial nerves 2-12 grossly intact. Moves all extremities PSYCHIATRIC: Mood and affect appropriate to situation, no behavioral issues  Patient Active Problem List   Diagnosis Date Noted  . Anticoagulated on Coumadin 12/24/2019  . Infection due to ESBL-producing Escherichia coli 08/12/2019  . MRSA cellulitis 08/12/2019  . Sacral decubitus ulcer, stage IV ([place]) 08/05/2019  . Infected decubitus ulcer, stage IV ([place]) 08/05/2019  . 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 End stage renal disease on dialysis due to type 2 diabetes mellitus ([place]) 03/24/2019  . 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 ([place]) 03/24/2019  . Anemia due to end stage renal disease ([place]) 03/24/2019  . Protein-calorie malnutrition, severe ([place]) 03/24/2019  . Hyperlipidemia associated with type 2 diabetes mellitus ([place]) 02/22/2019  . Amputation stump infection ([place]) 02/15/2019  . Conjunctivitis of left eye 01/04/2019  .  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[place]) 12/30/2018  . Open wound of left foot   . Encounter for monitoring Coumadin therapy   . OSA (obstructive sleep apnea)   . ESRD on hemodialysis ([place])   . BPH (benign prostatic hyperplasia)   . Hypotension 12/07/2018  . GERD (gastroesophageal reflux disease) 12/07/2018  . Pressure injury of skin 12/03/2018  . Symptomatic anemia 12/02/2018  . Acute on chronic blood loss anemia 12/02/2018  . 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[place]) 12/02/2018  . Lower GI bleeding 12/02/2018  . Heme positive stool   . Acute  on chronic diastolic (congestive) heart failure ([place]) 11/19/2018  . Acute CVA (cerebrovascular accident) ([place]) 11/19/2018  . Hypoglycemia 11/19/2018  . Peripheral vascular disease due to secondary diabetes ([place]) 11/19/2018  . Dry gangrene ([place]) 11/19/2018  . 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 DNR (do not resuscitate) discussion   . Goals of care, counseling/discussion   . Palliative care by specialist   . Acute hypoxemic respiratory failure ([place]) 11/06/2018  . Volume overload 11/06/2018  . Multifocal pneumonia 11/06/2018  . Acute metabolic encephalopathy 62/83/1517  . Physical deconditioning 11/06/2018  . Acute respiratory failure with hypoxia and hypercapnia ([place]) 11/04/2018  . CAP (community acquired pneumonia) 11/04/2018  . Pleural effusion, right 11/04/2018  . Increased ammonia level 11/04/2018  . Abnormal CT of the abdomen 11/04/2018  . Atrial fibrillation with RVR ([place]) 11/04/2018  . Sepsis ([place]) 10/21/2018  . Acute encephalopathy 10/21/2018  . Elevated alkaline phosphatase level 10/21/2018  . Chronic anemia 10/21/2018  . Thrombocytopenia ([place]) 10/21/2018  . Pulmonary HTN ([place]) 03/04/2017  . Aortic valve stenosis 03/04/2017  . Hypertension, accelerated with heart disease, without CHF 01/12/2017  . Dyspnea on exertion 01/12/2017  . ESRD (end stage renal disease) ([place]) 06/07/2012  . Hyperlipidemia  04/24/2011  . HYPERTENSION, BENIGN 10/24/2009  . Type 2 diabetes mellitus with hypertension and end stage renal disease on dialysis ([place]) 10/23/2009  . OBSTRUCTIVE SLEEP APNEA 10/23/2009  . PAF (paroxysmal atrial fibrillation) ([place]) 10/23/2009  . CHF (congestive heart failure) ([place]) 10/23/2009    CMP     Component Value Date/Time   NA 138 01/03/2020 1106   NA 143 06/06/2019 0000   K 3.7 01/03/2020 1106   CL 101 01/03/2020 1106   CO2 25 08/16/2019 1818   GLUCOSE 102 (H) 01/03/2020 1106   BUN 38 (H) 01/03/2020 1106   BUN 44 (A) 06/06/2019 0000   CREATININE 5.90 (H) 01/03/2020 1106   CALCIUM 9.5 08/16/2019 1818   CALCIUM 8.3 (L) 10/25/2008 1450   PROT 7.8 08/16/2019 1818   ALBUMIN 2.9 (L) 08/16/2019 1818   AST 54 (H) 08/16/2019 1818   ALT 38 08/16/2019 1818   ALKPHOS 229 (H) 08/16/2019 1818   BILITOT 1.2 08/16/2019 1818   GFRNONAA 15 (L) 08/16/2019 1818   GFRAA 18 (L) 08/16/2019 1818   Recent Labs    05/28/19 0253 05/28/19 0253 05/29/19 0541 05/29/19 2316 05/30/19 0727 05/30/19 0727 05/31/19 1319 06/06/19 0000 07/27/19 1529 07/27/19 1529 08/16/19 1818 01/02/20 0841 01/03/20 1106  NA 137   < >  --   --  137   < > 136   < > 138   < > 136 140 138  K 3.7   < >  --   --  5.1   < > 3.6   < > 3.5   < > 4.0 3.6 3.7  CL 102   < >  --   --  101   < > 98  --  95*   < > 97* 95* 101  CO2 21*   < >  --   --  17*   < > 24  --  26  --  25  --   --   GLUCOSE 151*   < >  --   --  99   < > 140*  --  143*   < > 75 120* 102*  BUN 80*   < >  --   --  158*   < > 44*   < >  48*   < > 28* 28* 38*  CREATININE 6.12*   < >  --   --  8.32*   < > 4.29*   < > 4.61*   < > 3.59* 3.80* 5.90*  CALCIUM 9.3   < >  --   --  10.0   < > 9.1  --  10.2  --  9.5  --   --   MG 2.0  --  2.1 2.4  --   --   --   --   --   --   --   --   --   PHOS  --   --   --   --  2.6  --  3.0  --   --   --   --   --   --    < > = values in this interval not displayed.   Recent Labs    05/27/19 0348 05/27/19 0348  05/28/19 0253 05/28/19 0253 05/30/19 0727 05/31/19 1319 08/16/19 1818  AST 55*  --  31  --   --   --  54*  ALT 55*  --  42  --   --   --  38  ALKPHOS 272*  --  261*  --   --   --  229*  BILITOT 0.8  --  0.6  --   --   --  1.2  PROT 7.0  --  6.5  --   --   --  7.8  ALBUMIN 2.4*   < > 2.2*   < > 2.3* 2.3* 2.9*   < > = values in this interval not displayed.   Recent Labs    05/31/19 1319 05/31/19 1319 06/06/19 0000 06/06/19 0000 07/27/19 1529 07/27/19 1529 08/16/19 1818 01/02/20 0841 01/03/20 1106  WBC 10.3  --  11.5  --  18.6*  --  6.9  --   --   NEUTROABS  --   --  8  --  15.5*  --  4.4  --   --   HGB 7.6*   < > 9.3*   < > 12.4*   < > 11.0* 11.9* 10.2*  HCT 24.7*   < > 29*   < > 40.2   < > 34.8* 35.0* 30.0*  MCV 88.5  --   --   --  89.3  --  89.5  --   --   PLT 220   < > 251  --  171  --  193  --   --    < > = values in this interval not displayed.   No results for input(s): CHOL, LDLCALC, TRIG in the last 8760 hours.  Invalid input(s): HCL No results found for: [facility] Lab Results  Component Value Date   TSH 1.839 05/25/2019   Lab Results  Component Value Date   HGBA1C 3.9 01/20/2020   Lab Results  Component Value Date   CHOL 113 11/11/2018   HDL 39 (L) 11/11/2018   LDLCALC 62 11/11/2018   TRIG 60 11/11/2018   CHOLHDL 2.9 11/11/2018    Significant Diagnostic Results in last 30 days:  No results found.  Assessment and Plan  No problem-specific Assessment & Plan notes found for this encounter.   Labs/tests ordered:    Edward Duos, MD

## 2020-03-26 ENCOUNTER — Non-Acute Institutional Stay (SKILLED_NURSING_FACILITY): Payer: Medicare Other | Admitting: Internal Medicine

## 2020-03-26 ENCOUNTER — Encounter: Payer: Self-pay | Admitting: Internal Medicine

## 2020-03-26 DIAGNOSIS — E7849 Other hyperlipidemia: Secondary | ICD-10-CM

## 2020-03-26 DIAGNOSIS — Z89611 Acquired absence of right leg above knee: Secondary | ICD-10-CM

## 2020-03-26 DIAGNOSIS — Z89612 Acquired absence of left leg above knee: Secondary | ICD-10-CM | POA: Diagnosis not present

## 2020-03-26 DIAGNOSIS — D649 Anemia, unspecified: Secondary | ICD-10-CM

## 2020-03-26 NOTE — Progress Notes (Signed)
Location:  [place] Room Number: 414-D Place of Service:  SNF (31)  Hennie Duos, MD  Patient Care Team: Hennie Duos, MD as PCP - General (Internal Medicine) [name], MD as Consulting Physician (Nephrology) Rehab, [place] ([place], [facility]  Extended Emergency Contact Information Primary Emergency Contact: [name],[name] Address: [place], [place] [address] [name] of [place] Phone: [phone] Work Phone: [phone] Mobile Phone: [phone] Relation: Daughter Secondary Emergency Contact: [address] Phone: [phone] Mobile Phone: [phone] Relation: Other    Allergies: Occlusive silicone sheets [silicone], Other, Sulfa antibiotics, and Tape  Chief Complaint  Patient presents with  . Medical Management of Chronic Issues    Routine [facility] visit    HPI: Patient is a 79 y.o. male who is being seen for routine issues of status post bilateral AKA, hyperlipidemia, and chronic anemia.  Past Medical History:  Diagnosis Date  . A-fib ([place])   . Anemia   . Aortic stenosis   . Blood transfusion   . BPH (benign prostatic hyperplasia)   . CHF (congestive heart failure) ([place])   . Diarrhea   . DM (diabetes mellitus) ([place])    Type II-   not on medication   . ESRD on hemodialysis ([place])    Started dialysis in 2009  . History of GI bleed    secondary to coumadin  . HTN (hypertension)   . Hyperlipidemia   . OSA (obstructive sleep apnea)    uses CPAP  . Sacral wound    stage II - healed, has a protective dressing on- 12/28/2019  . Secondary hyperparathyroidism of renal origin [facility])     Past Surgical History:  Procedure Laterality Date  . ABDOMINAL AORTOGRAM N/A 11/15/2018   Procedure: ABDOMINAL AORTOGRAM;  Surgeon: [name], MD;  Location: [place] CV LAB;  Service: Cardiovascular;  Laterality: N/A;  .  AMPUTATION Left 12/06/2018   Procedure: AMPUTATION DIGIT LEFT FIFTH TOE;  Surgeon: Angelia Mould, MD;  Location: [place];  Service: Vascular;  Laterality: Left;  . AMPUTATION Bilateral 12/29/2018   Procedure: AMPUTATION ABOVE KNEE;  Surgeon: Waynetta Sandy, MD;  Location: [place];  Service: Vascular;  Laterality: Bilateral;  . APPLICATION OF WOUND VAC Right 02/17/2019   Procedure: Application Of Wound Vac;  Surgeon: Marty Heck, MD;  Location: [place];  Service: Vascular;  Laterality: Right;  . BVT  2/77/82   Left  Basilic Vein Transposition  . CHOLECYSTECTOMY    . EYE SURGERY     Catarct bil  . I & D EXTREMITY Right 02/17/2019   Procedure: Right above the kneee debridement;  Surgeon: Marty Heck, MD;  Location: [place];  Service: Vascular;  Laterality: Right;  . INSERTION OF DIALYSIS CATHETER  05/28/2012   Procedure: INSERTION OF DIALYSIS CATHETER;  Surgeon: Mal Misty, MD;  Location: [place];  Service: Vascular;  Laterality: Right;  . INSERTION OF DIALYSIS CATHETER Right 01/02/2020   Procedure: INSERTION OF DIALYSIS CATHETER RIGHT INTERNAL JUGULAR;  Surgeon: Elam Dutch, MD;  Location: [place];  Service: Vascular;  Laterality: Right;  . INSERTION OF DIALYSIS CATHETER Right 01/03/2020   Procedure: INSERTION OF DIALYSIS CATHETER RIGHT INTERNAL JUGULAR;  Surgeon: Rosetta Posner, MD;  Location: [place];  Service: Vascular;  Laterality: Right;  . Left arm shuntogram.    . Left forearm loop graft with  6 mm Gore-Tex graft.    . LOWER EXTREMITY ANGIOGRAPHY Bilateral 11/15/2018   Procedure: LOWER EXTREMITY ANGIOGRAPHY;  Surgeon: [name], MD;  Location: [place] [facility];  Service: Cardiovascular;  Laterality: Bilateral;  . Pars plana vitrectomy with 25-gauge system    . PERIPHERAL VASCULAR ATHERECTOMY Left 11/15/2018   Procedure: PERIPHERAL VASCULAR ATHERECTOMY;  Surgeon: [name], MD;  Location: [place] [facility];  Service: Cardiovascular;  Laterality:  Left;  SFA with STENT  . PERIPHERAL VASCULAR BALLOON ANGIOPLASTY Left 11/15/2018   Procedure: PERIPHERAL VASCULAR BALLOON ANGIOPLASTY;  Surgeon: [name], MD;  Location: [place] [facility];  Service: Cardiovascular;  Laterality: Left;  PT  . REVISON OF ARTERIOVENOUS FISTULA Left 01/02/2020   Procedure: REVISON OF LEFT ARM ARTERIOVENOUS FISTULA;  Surgeon: Elam Dutch, MD;  Location: [place];  Service: Vascular;  Laterality: Left;    Allergies as of 03/26/2020      Reactions   Occlusive Silicone Sheets [silicone] Other (See Comments)   "Allergic," per [facility]   Other Other (See Comments)   Unknown reaction to Occlusive adhesive- "Allergic," per [facility]   Sulfa Antibiotics    Tape Itching, Other (See Comments)   Use Cloth tape only, please      Medication List       Accurate as of March 26, 2020 11:59 PM. If you have any questions, ask your nurse or doctor.        acetaminophen 500 MG tablet Commonly known as: TYLENOL Take 1,000 mg by mouth 2 (two) times daily.   ADULT NUTRITIONAL SUPPLEMENT PO Take 1 each by mouth daily with supper. Magic Cup with dinner   NEPRO PO Take 1 Can by mouth 2 (two) times daily.   atorvastatin 20 MG tablet Commonly known as: LIPITOR Take 20 mg by mouth daily.   dicyclomine 20 MG tablet Commonly known as: BENTYL Take 20 mg by mouth 3 (three) times daily before meals.   divalproex 125 MG capsule Commonly known as: DEPAKOTE SPRINKLE Take 125 mg by mouth in the morning.   divalproex 125 MG capsule Commonly known as: DEPAKOTE SPRINKLE Take 250 mg by mouth at bedtime.   feeding supplement (PRO-STAT SUGAR FREE 64) Liqd Take 30 mLs by mouth 2 (two) times daily. 9a and 5p   Hectorol 4 MCG/2ML injection Generic drug: doxercalciferol Inject 2 mcg into the vein every Monday, Wednesday, and Friday with hemodialysis.   HYDROcodone-acetaminophen 5-325 MG tablet Commonly known as: NORCO/VICODIN Take 1 tablet by mouth 2 (two) times daily. For  Pain   loperamide 2 MG tablet Commonly known as: IMODIUM A-D Take 4 mg by mouth See admin instructions. Take 2 tablets ( 4 mg) by mouth after 1st loose stool as needed for diarrhea   metoprolol tartrate 25 MG tablet Commonly known as: LOPRESSOR Give 1 tablet by mouth at bedtime on Mon Wed ,Fri   metoprolol tartrate 25 MG tablet Commonly known as: LOPRESSOR Give 1 tablet by mouth twice daily on Tues ,Thur ,Sat ,Sun   midodrine 10 MG tablet Commonly known as: PROAMATINE Take one tablet (10 mg) by mouth on Monday, Wednesday, Friday 30 minutes prior to dialysis.   multivitamin Tabs tablet Take 1 tablet by mouth at bedtime.   NON FORMULARY DIET :Regular NAS Diet with Double meat Portion,1200 ml fluid restriction. Diet liberalized to promote PO Intake.   pantoprazole 40 MG tablet Commonly known as: PROTONIX Take 1 tablet (40 mg total) by mouth daily.   sevelamer carbonate  800 MG tablet Commonly known as: RENVELA Take 1,600 mg by mouth 3 (three) times daily with meals.   Vitamin D3 1.25 MG (50000 UT) Caps Take 50,000 Units by mouth every Friday.   warfarin 3 MG tablet Commonly known as: COUMADIN Take 1 tablet PO daily along with 0.5 mg to =3.5   warfarin 1 MG tablet Commonly known as: COUMADIN Take 0.5 mg by mouth along with 3 mg to = 3.5 mg daily       No orders of the defined types were placed in this encounter.   Immunization History  Administered Date(s) Administered  . Influenza-Unspecified 08/29/2019  . Moderna SARS-COVID-2 Vaccination 12/28/2019, 01/25/2020  . Pneumococcal Polysaccharide-23 10/07/2019    Social History   Tobacco Use  . Smoking status: Former Smoker    Types: Cigarettes    Quit date: 06/30/2004    Years since quitting: 15.7  . Smokeless tobacco: Never Used  Substance Use Topics  . Alcohol use: No    Review of Systems  Patient can participate very little GENERAL:  no fevers, fatigue, appetite changes SKIN: No itching, rash HEENT:  No complaint RESPIRATORY: No cough, wheezing, SOB CARDIAC: No chest pain, palpitations, lower extremity edema  GI: No abdominal pain, No N/V/D or constipation, No heartburn or reflux  GU: No dysuria, frequency or urgency, or incontinence  MUSCULOSKELETAL: No unrelieved bone/joint pain NEUROLOGIC: No headache, dizziness  PSYCHIATRIC: No overt anxiety or sadness  Vitals:   03/26/20 1219  BP: 100/62  Pulse: 84  Resp: 20  Temp: (!) 96.9 F (36.1 C)  SpO2: 96%   Body mass index is 45.4 kg/m. Physical Exam  GENERAL APPEARANCE: Alert, conversant, No acute distress  SKIN: No diaphoresis rash HEENT: Unremarkable RESPIRATORY: Breathing is even, unlabored. Lung sounds are clear   CARDIOVASCULAR: Heart RRR no murmurs, rubs or gallops. No peripheral edema  GASTROINTESTINAL: Abdomen is soft, non-tender, not distended w/ normal bowel sounds.  GENITOURINARY: Bladder non tender, not distended  MUSCULOSKELETAL: Bilateral AKA NEUROLOGIC: Cranial nerves 2-12 grossly intact. Moves all extremities PSYCHIATRIC: Mood and affect appropriate with some cognition defect, no behavioral issues  Patient Active Problem List   Diagnosis Date Noted  . Anticoagulated on Coumadin 12/24/2019  . Infection due to ESBL-producing Escherichia coli 08/12/2019  . MRSA cellulitis 08/12/2019  . Sacral decubitus ulcer, stage IV ([place]) 08/05/2019  . Infected decubitus ulcer, stage IV ([place]) 08/05/2019  . Hypertensive heart and kidney disease with chronic diastolic congestive heart failure and stage 5 chronic kidney disease on chronic dialysis ([place]) 03/24/2019  . End stage renal disease on dialysis due to type 2 diabetes mellitus ([place]) 03/24/2019  . 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 ([place]) 03/24/2019  . Anemia due to end stage renal disease ([place]) 03/24/2019  . Protein-calorie malnutrition, severe ([place]) 03/24/2019  . Hyperlipidemia associated with type 2 diabetes mellitus ([place]) 02/22/2019  . Amputation stump  infection ([place]) 02/15/2019  . Conjunctivitis of left eye 01/04/2019  . 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[place]) 12/30/2018  . Open wound of left foot   . Encounter for monitoring Coumadin therapy   . OSA (obstructive sleep apnea)   . ESRD on hemodialysis ([place])   . BPH (benign prostatic hyperplasia)   . Hypotension 12/07/2018  . GERD (gastroesophageal reflux disease) 12/07/2018  . Pressure injury of skin 12/03/2018  . Symptomatic anemia 12/02/2018  . Acute on chronic blood loss anemia 12/02/2018  . 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[place]) 12/02/2018  . Lower GI bleeding 12/02/2018  .  Heme positive stool   . Acute on chronic diastolic (congestive) heart failure ([place]) 11/19/2018  . Acute CVA (cerebrovascular accident) ([place]) 11/19/2018  . Hypoglycemia 11/19/2018  . Peripheral vascular disease due to secondary diabetes ([place]) 11/19/2018  . Dry gangrene ([place]) 11/19/2018  . 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 DNR (do not resuscitate) discussion   . Goals of care, counseling/discussion   . Palliative care by specialist   . Acute hypoxemic respiratory failure ([place]) 11/06/2018  . Volume overload 11/06/2018  . Multifocal pneumonia 11/06/2018  . Acute metabolic encephalopathy 40/98/1191  . Physical deconditioning 11/06/2018  . Acute respiratory failure with hypoxia and hypercapnia ([place]) 11/04/2018  . CAP (community acquired pneumonia) 11/04/2018  . Pleural effusion, right 11/04/2018  . Increased ammonia level 11/04/2018  . Abnormal CT of the abdomen 11/04/2018  . Atrial fibrillation with RVR ([place]) 11/04/2018  . Sepsis ([place]) 10/21/2018  . Acute encephalopathy 10/21/2018  . Elevated alkaline phosphatase level 10/21/2018  . Chronic anemia 10/21/2018  . Thrombocytopenia ([place]) 10/21/2018  . Pulmonary HTN ([place]) 03/04/2017  . Aortic valve stenosis 03/04/2017  . Hypertension, accelerated with heart disease, without CHF 01/12/2017  . Dyspnea on exertion 01/12/2017    . ESRD (end stage renal disease) ([place]) 06/07/2012  . Hyperlipidemia 04/24/2011  . HYPERTENSION, BENIGN 10/24/2009  . Type 2 diabetes mellitus with hypertension and end stage renal disease on dialysis ([place]) 10/23/2009  . OBSTRUCTIVE SLEEP APNEA 10/23/2009  . PAF (paroxysmal atrial fibrillation) ([place]) 10/23/2009  . CHF (congestive heart failure) ([place]) 10/23/2009    CMP     Component Value Date/Time   NA 138 01/03/2020 1106   NA 143 06/06/2019 0000   K 3.7 01/03/2020 1106   CL 101 01/03/2020 1106   CO2 25 08/16/2019 1818   GLUCOSE 102 (H) 01/03/2020 1106   BUN 38 (H) 01/03/2020 1106   BUN 44 (A) 06/06/2019 0000   CREATININE 5.90 (H) 01/03/2020 1106   CALCIUM 9.5 08/16/2019 1818   CALCIUM 8.3 (L) 10/25/2008 1450   PROT 7.8 08/16/2019 1818   ALBUMIN 2.9 (L) 08/16/2019 1818   AST 54 (H) 08/16/2019 1818   ALT 38 08/16/2019 1818   ALKPHOS 229 (H) 08/16/2019 1818   BILITOT 1.2 08/16/2019 1818   GFRNONAA 15 (L) 08/16/2019 1818   GFRAA 18 (L) 08/16/2019 1818   Recent Labs    05/28/19 0253 05/28/19 0253 05/29/19 0541 05/29/19 2316 05/30/19 0727 05/30/19 0727 05/31/19 1319 06/06/19 0000 07/27/19 1529 07/27/19 1529 08/16/19 1818 01/02/20 0841 01/03/20 1106  NA 137   < >  --   --  137   < > 136   < > 138   < > 136 140 138  K 3.7   < >  --   --  5.1   < > 3.6   < > 3.5   < > 4.0 3.6 3.7  CL 102   < >  --   --  101   < > 98  --  95*   < > 97* 95* 101  CO2 21*   < >  --   --  17*   < > 24  --  26  --  25  --   --   GLUCOSE 151*   < >  --   --  99   < > 140*  --  143*   < > 75 120* 102*  BUN 80*   < >  --   --  158*   < >  44*   < > 48*   < > 28* 28* 38*  CREATININE 6.12*   < >  --   --  8.32*   < > 4.29*   < > 4.61*   < > 3.59* 3.80* 5.90*  CALCIUM 9.3   < >  --   --  10.0   < > 9.1  --  10.2  --  9.5  --   --   MG 2.0  --  2.1 2.4  --   --   --   --   --   --   --   --   --   PHOS  --   --   --   --  2.6  --  3.0  --   --   --   --   --   --    < > = values in this interval  not displayed.   Recent Labs    05/27/19 0348 05/27/19 0348 05/28/19 0253 05/28/19 0253 05/30/19 0727 05/31/19 1319 08/16/19 1818  AST 55*  --  31  --   --   --  54*  ALT 55*  --  42  --   --   --  38  ALKPHOS 272*  --  261*  --   --   --  229*  BILITOT 0.8  --  0.6  --   --   --  1.2  PROT 7.0  --  6.5  --   --   --  7.8  ALBUMIN 2.4*   < > 2.2*   < > 2.3* 2.3* 2.9*   < > = values in this interval not displayed.   Recent Labs    05/31/19 1319 05/31/19 1319 06/06/19 0000 06/06/19 0000 07/27/19 1529 07/27/19 1529 08/16/19 1818 01/02/20 0841 01/03/20 1106  WBC 10.3  --  11.5  --  18.6*  --  6.9  --   --   NEUTROABS  --   --  8  --  15.5*  --  4.4  --   --   HGB 7.6*   < > 9.3*   < > 12.4*   < > 11.0* 11.9* 10.2*  HCT 24.7*   < > 29*   < > 40.2   < > 34.8* 35.0* 30.0*  MCV 88.5  --   --   --  89.3  --  89.5  --   --   PLT 220   < > 251  --  171  --  193  --   --    < > = values in this interval not displayed.   No results for input(s): CHOL, LDLCALC, TRIG in the last 8760 hours.  Invalid input(s): HCL No results found for: [facility] Lab Results  Component Value Date   TSH 1.839 05/25/2019   Lab Results  Component Value Date   HGBA1C 3.9 01/20/2020   Lab Results  Component Value Date   CHOL 113 11/11/2018   HDL 39 (L) 11/11/2018   LDLCALC 62 11/11/2018   TRIG 60 11/11/2018   CHOLHDL 2.9 11/11/2018    Significant Diagnostic Results in last 30 days:  No results found.  Assessment and Plan  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HCC) Status post bilateral AKA; patient is able to ambulate in wheelchair; no reported problems; continue supportive care  Hyperlipidemia No reported problems; continue Lipitor 20 mg daily  Chronic anemia Most recent hemoglobin is 11  which is good for patient and he is on chronic Coumadin; will monitor intervals     Hennie Duos, MD

## 2020-03-26 NOTE — Progress Notes (Signed)
Location:  [facility] Room Number: 414-D Place of Service:  SNF (31)  Hennie Duos, MD  Patient Care Team: Hennie Duos, MD as PCP - General (Internal Medicine) [name], MD as Consulting Physician (Nephrology) Rehab, [place] ([place], [facility]  Extended Emergency Contact Information Primary Emergency Contact: [name],[name] Address: [place], [place] [address] [name] [place] Phone: [phone] Work Phone: [phone] Mobile Phone: [phone] Relation: Daughter Secondary Emergency Contact: [place] Phone: [phone] Mobile Phone: [phone] Relation: Other    Allergies: Occlusive silicone sheets [silicone], Other, Sulfa antibiotics, and Tape  Chief Complaint  Patient presents with  . Anticoagulation    Patient seen for Coumadin management.    HPI: Patient is a 79 y.o. male with atrial fibrillation who is being seen for INR check.  Today patient's INR is 2.1 which is therapeutic.  Past Medical History:  Diagnosis Date  . A-fib ([place])   . Anemia   . Aortic stenosis   . Blood transfusion   . BPH (benign prostatic hyperplasia)   . CHF (congestive heart failure) ([place])   . Diarrhea   . DM (diabetes mellitus) ([place])    Type II-   not on medication   . ESRD on hemodialysis ([place])    Started dialysis in 2009  . History of GI bleed    secondary to coumadin  . HTN (hypertension)   . Hyperlipidemia   . OSA (obstructive sleep apnea)    uses CPAP  . Sacral wound    stage II - healed, has a protective dressing on- 12/28/2019  . Secondary hyperparathyroidism of renal origin [facility])     Past Surgical History:  Procedure Laterality Date  . ABDOMINAL AORTOGRAM N/A 11/15/2018   Procedure: ABDOMINAL AORTOGRAM;  Surgeon: [name], MD;  Location: [facility] LAB;  Service: Cardiovascular;  Laterality: N/A;  . AMPUTATION Left  12/06/2018   Procedure: AMPUTATION DIGIT LEFT FIFTH TOE;  Surgeon: Angelia Mould, MD;  Location: [facility];  Service: Vascular;  Laterality: Left;  . AMPUTATION Bilateral 12/29/2018   Procedure: AMPUTATION ABOVE KNEE;  Surgeon: Waynetta Sandy, MD;  Location: [facility];  Service: Vascular;  Laterality: Bilateral;  . APPLICATION OF WOUND VAC Right 02/17/2019   Procedure: Application Of Wound Vac;  Surgeon: Marty Heck, MD;  Location: [place];  Service: Vascular;  Laterality: Right;  . BVT  0/25/85   Left  Basilic Vein Transposition  . CHOLECYSTECTOMY    . EYE SURGERY     Catarct bil  . I & D EXTREMITY Right 02/17/2019   Procedure: Right above the kneee debridement;  Surgeon: Marty Heck, MD;  Location: [facility];  Service: Vascular;  Laterality: Right;  . INSERTION OF DIALYSIS CATHETER  05/28/2012   Procedure: INSERTION OF DIALYSIS CATHETER;  Surgeon: Mal Misty, MD;  Location: [facility];  Service: Vascular;  Laterality: Right;  . INSERTION OF DIALYSIS CATHETER Right 01/02/2020   Procedure: INSERTION OF DIALYSIS CATHETER RIGHT INTERNAL JUGULAR;  Surgeon: Elam Dutch, MD;  Location: [facility];  Service: Vascular;  Laterality: Right;  . INSERTION OF DIALYSIS CATHETER Right 01/03/2020   Procedure: INSERTION OF DIALYSIS CATHETER RIGHT INTERNAL JUGULAR;  Surgeon: Rosetta Posner, MD;  Location: [facility];  Service: Vascular;  Laterality: Right;  . Left arm shuntogram.    . Left forearm loop graft with 6  mm Gore-Tex graft.    . LOWER EXTREMITY ANGIOGRAPHY Bilateral 11/15/2018   Procedure: LOWER EXTREMITY ANGIOGRAPHY;  Surgeon: [name], MD;  Location: [facility] CV LAB;  Service: Cardiovascular;  Laterality: Bilateral;  . Pars plana vitrectomy with 25-gauge system    . PERIPHERAL VASCULAR ATHERECTOMY Left 11/15/2018   Procedure: PERIPHERAL VASCULAR ATHERECTOMY;  Surgeon: [name], MD;  Location: [facility] LAB;  Service: Cardiovascular;  Laterality: Left;  SFA with  STENT  . PERIPHERAL VASCULAR BALLOON ANGIOPLASTY Left 11/15/2018   Procedure: PERIPHERAL VASCULAR BALLOON ANGIOPLASTY;  Surgeon: [name], MD;  Location: [facility] CV LAB;  Service: Cardiovascular;  Laterality: Left;  PT  . REVISON OF ARTERIOVENOUS FISTULA Left 01/02/2020   Procedure: REVISON OF LEFT ARM ARTERIOVENOUS FISTULA;  Surgeon: Elam Dutch, MD;  Location: [facility];  Service: Vascular;  Laterality: Left;    Allergies as of 03/25/2020      Reactions   Occlusive Silicone Sheets [silicone] Other (See Comments)   "Allergic," per [facility]   Other Other (See Comments)   Unknown reaction to Occlusive adhesive- "Allergic," per [facility]   Sulfa Antibiotics    Tape Itching, Other (See Comments)   Use Cloth tape only, please      Medication List       Accurate as of March 25, 2020 11:59 PM. If you have any questions, ask your nurse or doctor.        acetaminophen 500 MG tablet Commonly known as: TYLENOL Take 1,000 mg by mouth 2 (two) times daily.   ADULT NUTRITIONAL SUPPLEMENT PO Take 1 each by mouth daily with supper. Magic Cup with dinner   NEPRO PO Take 1 Can by mouth 2 (two) times daily.   atorvastatin 20 MG tablet Commonly known as: LIPITOR Take 20 mg by mouth daily.   dicyclomine 20 MG tablet Commonly known as: BENTYL Take 20 mg by mouth 3 (three) times daily before meals.   divalproex 125 MG capsule Commonly known as: DEPAKOTE SPRINKLE Take 125 mg by mouth in the morning.   divalproex 125 MG capsule Commonly known as: DEPAKOTE SPRINKLE Take 250 mg by mouth at bedtime.   feeding supplement (PRO-STAT SUGAR FREE 64) Liqd Take 30 mLs by mouth 2 (two) times daily. 9a and 5p   Hectorol 4 MCG/2ML injection Generic drug: doxercalciferol Inject 2 mcg into the vein every Monday, Wednesday, and Friday with hemodialysis.   HYDROcodone-acetaminophen 5-325 MG tablet Commonly known as: NORCO/VICODIN Take 1 tablet by mouth 2 (two) times daily. For Pain   loperamide 2  MG tablet Commonly known as: IMODIUM A-D Take 4 mg by mouth See admin instructions. Take 2 tablets ( 4 mg) by mouth after 1st loose stool as needed for diarrhea   metoprolol tartrate 25 MG tablet Commonly known as: LOPRESSOR Give 1 tablet by mouth at bedtime on Mon Wed ,Fri   metoprolol tartrate 25 MG tablet Commonly known as: LOPRESSOR Give 1 tablet by mouth twice daily on Tues ,Thur ,Sat ,Sun   midodrine 10 MG tablet Commonly known as: PROAMATINE Take one tablet (10 mg) by mouth on Monday, Wednesday, Friday 30 minutes prior to dialysis.   multivitamin Tabs tablet Take 1 tablet by mouth at bedtime.   NON FORMULARY DIET :Regular NAS Diet with Double meat Portion,1200 ml fluid restriction. Diet liberalized to promote PO Intake.   pantoprazole 40 MG tablet Commonly known as: PROTONIX Take 1 tablet (40 mg total) by mouth daily.   sevelamer carbonate 800  MG tablet Commonly known as: RENVELA Take 1,600 mg by mouth 3 (three) times daily with meals.   Vitamin D3 1.25 MG (50000 UT) Caps Take 50,000 Units by mouth every Friday.   warfarin 3 MG tablet Commonly known as: COUMADIN Take 1 tablet PO daily along with 0.5 mg to =3.5   warfarin 1 MG tablet Commonly known as: COUMADIN Take 0.5 mg by mouth along with 3 mg to = 3.5 mg daily       No orders of the defined types were placed in this encounter.   Immunization History  Administered Date(s) Administered  . Influenza-Unspecified 08/29/2019  . Moderna SARS-COVID-2 Vaccination 12/28/2019, 01/25/2020  . Pneumococcal Polysaccharide-23 10/07/2019    Social History   Tobacco Use  . Smoking status: Former Smoker    Types: Cigarettes    Quit date: 06/30/2004    Years since quitting: 15.7  . Smokeless tobacco: Never Used  Substance Use Topics  . Alcohol use: No     Vitals:   03/25/20 1159  BP: 100/62  Pulse: 68  Resp: 16  Temp: (!) 97.5 F (36.4 C)  SpO2: 96%   Body mass index is 45.4 kg/m.   Patient Active  Problem List   Diagnosis Date Noted  . Anticoagulated on Coumadin 12/24/2019  . Infection due to ESBL-producing Escherichia coli 08/12/2019  . MRSA cellulitis 08/12/2019  . Sacral decubitus ulcer, stage IV ([place]) 08/05/2019  . Infected decubitus ulcer, stage IV ([place]) 08/05/2019  . Hypertensive heart and kidney disease with chronic diastolic congestive heart failure and stage 5 chronic kidney disease on chronic dialysis ([place]) 03/24/2019  . End stage renal disease on dialysis due to type 2 diabetes mellitus ([place]) 03/24/2019  . 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 Anemia due to end stage renal disease ([place]) 03/24/2019  . Protein-calorie malnutrition, severe ([place]) 03/24/2019  . Hyperlipidemia associated with type 2 diabetes mellitus ([place]) 02/22/2019  . Amputation stump infection ([place]) 02/15/2019  . Conjunctivitis of left eye 01/04/2019  . 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[place]) 12/30/2018  . Open wound of left foot   . Encounter for monitoring Coumadin therapy   . OSA (obstructive sleep apnea)   . ESRD on hemodialysis ([place])   . BPH (benign prostatic hyperplasia)   . Hypotension 12/07/2018  . GERD (gastroesophageal reflux disease) 12/07/2018  . Pressure injury of skin 12/03/2018  . Symptomatic anemia 12/02/2018  . Acute on chronic blood loss anemia 12/02/2018  . 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[place]) 12/02/2018  . Lower GI bleeding 12/02/2018  . Heme positive stool   . Acute on chronic diastolic (congestive) heart failure ([place]) 11/19/2018  . Acute CVA (cerebrovascular accident) ([place]) 11/19/2018  . Hypoglycemia 11/19/2018  . Peripheral vascular disease due to secondary diabetes ([place]) 11/19/2018  . Dry gangrene ([place]) 11/19/2018  . 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 DNR (do not resuscitate) discussion   . Goals of care, counseling/discussion   . Palliative care by specialist   . Acute hypoxemic respiratory failure ([place])  11/06/2018  . Volume overload 11/06/2018  . Multifocal pneumonia 11/06/2018  . Acute metabolic encephalopathy 29/56/2130  . Physical deconditioning 11/06/2018  . Acute respiratory failure with hypoxia and hypercapnia ([place]) 11/04/2018  . CAP (community acquired pneumonia) 11/04/2018  . Pleural effusion, right 11/04/2018  . Increased ammonia level 11/04/2018  . Abnormal CT of the abdomen 11/04/2018  . Atrial fibrillation with RVR ([place]) 11/04/2018  . Sepsis ([place]) 10/21/2018  .  Acute encephalopathy 10/21/2018  . Elevated alkaline phosphatase level 10/21/2018  . Chronic anemia 10/21/2018  . Thrombocytopenia ([place]) 10/21/2018  . Pulmonary HTN ([place]) 03/04/2017  . Aortic valve stenosis 03/04/2017  . Hypertension, accelerated with heart disease, without CHF 01/12/2017  . Dyspnea on exertion 01/12/2017  . ESRD (end stage renal disease) ([place]) 06/07/2012  . Hyperlipidemia 04/24/2011  . HYPERTENSION, BENIGN 10/24/2009  . Type 2 diabetes mellitus with hypertension and end stage renal disease on dialysis ([place]) 10/23/2009  . OBSTRUCTIVE SLEEP APNEA 10/23/2009  . PAF (paroxysmal atrial fibrillation) ([place]) 10/23/2009  . CHF (congestive heart failure) ([place]) 10/23/2009    CMP     Component Value Date/Time   NA 138 01/03/2020 1106   NA 143 06/06/2019 0000   K 3.7 01/03/2020 1106   CL 101 01/03/2020 1106   CO2 25 08/16/2019 1818   GLUCOSE 102 (H) 01/03/2020 1106   BUN 38 (H) 01/03/2020 1106   BUN 44 (A) 06/06/2019 0000   CREATININE 5.90 (H) 01/03/2020 1106   CALCIUM 9.5 08/16/2019 1818   CALCIUM 8.3 (L) 10/25/2008 1450   PROT 7.8 08/16/2019 1818   ALBUMIN 2.9 (L) 08/16/2019 1818   AST 54 (H) 08/16/2019 1818   ALT 38 08/16/2019 1818   ALKPHOS 229 (H) 08/16/2019 1818   BILITOT 1.2 08/16/2019 1818   GFRNONAA 15 (L) 08/16/2019 1818   GFRAA 18 (L) 08/16/2019 1818   Recent Labs    05/28/19 0253 05/28/19 0253 05/29/19 0541 05/29/19 2316 05/30/19 0727 05/30/19 0727 05/31/19 1319  06/06/19 0000 07/27/19 1529 07/27/19 1529 08/16/19 1818 01/02/20 0841 01/03/20 1106  NA 137   < >  --   --  137   < > 136   < > 138   < > 136 140 138  K 3.7   < >  --   --  5.1   < > 3.6   < > 3.5   < > 4.0 3.6 3.7  CL 102   < >  --   --  101   < > 98  --  95*   < > 97* 95* 101  CO2 21*   < >  --   --  17*   < > 24  --  26  --  25  --   --   GLUCOSE 151*   < >  --   --  99   < > 140*  --  143*   < > 75 120* 102*  BUN 80*   < >  --   --  158*   < > 44*   < > 48*   < > 28* 28* 38*  CREATININE 6.12*   < >  --   --  8.32*   < > 4.29*   < > 4.61*   < > 3.59* 3.80* 5.90*  CALCIUM 9.3   < >  --   --  10.0   < > 9.1  --  10.2  --  9.5  --   --   MG 2.0  --  2.1 2.4  --   --   --   --   --   --   --   --   --   PHOS  --   --   --   --  2.6  --  3.0  --   --   --   --   --   --    < > =  values in this interval not displayed.   Recent Labs    05/27/19 0348 05/27/19 0348 05/28/19 0253 05/28/19 0253 05/30/19 0727 05/31/19 1319 08/16/19 1818  AST 55*  --  31  --   --   --  54*  ALT 55*  --  42  --   --   --  38  ALKPHOS 272*  --  261*  --   --   --  229*  BILITOT 0.8  --  0.6  --   --   --  1.2  PROT 7.0  --  6.5  --   --   --  7.8  ALBUMIN 2.4*   < > 2.2*   < > 2.3* 2.3* 2.9*   < > = values in this interval not displayed.   Recent Labs    05/31/19 1319 05/31/19 1319 06/06/19 0000 06/06/19 0000 07/27/19 1529 07/27/19 1529 08/16/19 1818 01/02/20 0841 01/03/20 1106  WBC 10.3  --  11.5  --  18.6*  --  6.9  --   --   NEUTROABS  --   --  8  --  15.5*  --  4.4  --   --   HGB 7.6*   < > 9.3*   < > 12.4*   < > 11.0* 11.9* 10.2*  HCT 24.7*   < > 29*   < > 40.2   < > 34.8* 35.0* 30.0*  MCV 88.5  --   --   --  89.3  --  89.5  --   --   PLT 220   < > 251  --  171  --  193  --   --    < > = values in this interval not displayed.   No results for input(s): CHOL, LDLCALC, TRIG in the last 8760 hours.  Invalid input(s): HCL No results found for: [facility] Lab Results  Component Value  Date   TSH 1.839 05/25/2019   Lab Results  Component Value Date   HGBA1C 3.9 01/20/2020   Lab Results  Component Value Date   CHOL 113 11/11/2018   HDL 39 (L) 11/11/2018   LDLCALC 62 11/11/2018   TRIG 60 11/11/2018   CHOLHDL 2.9 11/11/2018    Significant Diagnostic Results in last 30 days:  No results found.  Assessment and Plan  Paroxysmal atrial fibrillation/anticoagulated on Coumadin-patient's INR is 2.1 which is good on Coumadin 3.5 daily except for Thursday when it is 4 mg; continue current regimen and recheck in 1 week    Hennie Duos , MD

## 2020-03-27 ENCOUNTER — Other Ambulatory Visit: Payer: Self-pay | Admitting: Internal Medicine

## 2020-03-27 MED ORDER — HYDROCODONE-ACETAMINOPHEN 5-325 MG PO TABS: 1.0000 | ORAL_TABLET | Freq: Two times a day (BID) | ORAL | 0 refills | Status: DC

## 2020-03-31 ENCOUNTER — Encounter: Payer: Self-pay | Admitting: Internal Medicine

## 2020-03-31 NOTE — Assessment & Plan Note (Signed)
Status post bilateral AKA; patient is able to ambulate in wheelchair; no reported problems; continue supportive care

## 2020-03-31 NOTE — Assessment & Plan Note (Signed)
No reported problems; continue Lipitor 20 mg daily

## 2020-03-31 NOTE — Assessment & Plan Note (Signed)
Most recent hemoglobin is 11 which is good for patient and he is on chronic Coumadin; will monitor intervals

## 2020-04-01 ENCOUNTER — Non-Acute Institutional Stay (SKILLED_NURSING_FACILITY): Payer: Medicare Other | Admitting: Internal Medicine

## 2020-04-01 DIAGNOSIS — Z7901 Long term (current) use of anticoagulants: Secondary | ICD-10-CM

## 2020-04-01 DIAGNOSIS — I480 Paroxysmal atrial fibrillation: Secondary | ICD-10-CM | POA: Diagnosis not present

## 2020-04-01 NOTE — Progress Notes (Signed)
Location:  [place] Room Number: 414-D Place of Service:  SNF (31)  Edward Duos, MD  Patient Care Team: Edward Duos, MD as PCP - General (Internal Medicine) [name], MD as Consulting Physician (Nephrology) Rehab, [place] [facility]  Extended Emergency Contact Information Primary Emergency Contact: [name],[name] Address: [place], [place] [address] [name] of [place] Phone: [phone] Work Phone: [phone] Mobile Phone: [phone] Relation: Daughter Secondary Emergency Contact: [place] Phone: [phone] Mobile Phone: [phone] Relation: Other    Allergies: Occlusive silicone sheets [silicone], Other, Sulfa antibiotics, and Tape  Chief Complaint  Patient presents with  . Anticoagulation    Patient seen for Coumadin management    HPI: Patient is a 79 y.o. male with atrial fibrillation who is on Coumadin who is being seen for an INR check.  Today's INR is 2.3 which is therapeutic.  Past Medical History:  Diagnosis Date  . A-fib ([place])   . Anemia   . Aortic stenosis   . Blood transfusion   . BPH (benign prostatic hyperplasia)   . CHF (congestive heart failure) ([place])   . Diarrhea   . DM (diabetes mellitus) ([place])    Type II-   not on medication   . ESRD on hemodialysis ([place])    Started dialysis in 2009  . History of GI bleed    secondary to coumadin  . HTN (hypertension)   . Hyperlipidemia   . OSA (obstructive sleep apnea)    uses CPAP  . Sacral wound    stage II - healed, has a protective dressing on- 12/28/2019  . Secondary hyperparathyroidism of renal origin [facility])     Past Surgical History:  Procedure Laterality Date  . ABDOMINAL AORTOGRAM N/A 11/15/2018   Procedure: ABDOMINAL AORTOGRAM;  Surgeon: [name], MD;  Location: [place] CV LAB;  Service: Cardiovascular;  Laterality: N/A;  .  AMPUTATION Left 12/06/2018   Procedure: AMPUTATION DIGIT LEFT FIFTH TOE;  Surgeon: Angelia Mould, MD;  Location: [place];  Service: Vascular;  Laterality: Left;  . AMPUTATION Bilateral 12/29/2018   Procedure: AMPUTATION ABOVE KNEE;  Surgeon: Waynetta Sandy, MD;  Location: [place];  Service: Vascular;  Laterality: Bilateral;  . APPLICATION OF WOUND VAC Right 02/17/2019   Procedure: Application Of Wound Vac;  Surgeon: Marty Heck, MD;  Location: [place];  Service: Vascular;  Laterality: Right;  . BVT  08/08/74   Left  Basilic Vein Transposition  . CHOLECYSTECTOMY    . EYE SURGERY     Catarct bil  . I & D EXTREMITY Right 02/17/2019   Procedure: Right above the kneee debridement;  Surgeon: Marty Heck, MD;  Location: [place];  Service: Vascular;  Laterality: Right;  . INSERTION OF DIALYSIS CATHETER  05/28/2012   Procedure: INSERTION OF DIALYSIS CATHETER;  Surgeon: Mal Misty, MD;  Location: [place];  Service: Vascular;  Laterality: Right;  . INSERTION OF DIALYSIS CATHETER Right 01/02/2020   Procedure: INSERTION OF DIALYSIS CATHETER RIGHT INTERNAL JUGULAR;  Surgeon: Elam Dutch, MD;  Location: [place];  Service: Vascular;  Laterality: Right;  . INSERTION OF DIALYSIS CATHETER Right 01/03/2020   Procedure: INSERTION OF DIALYSIS CATHETER RIGHT INTERNAL JUGULAR;  Surgeon: Rosetta Posner, MD;  Location: [place];  Service: Vascular;  Laterality: Right;  . Left arm shuntogram.    . Left forearm  loop graft with 6 mm Gore-Tex graft.    . LOWER EXTREMITY ANGIOGRAPHY Bilateral 11/15/2018   Procedure: LOWER EXTREMITY ANGIOGRAPHY;  Surgeon: [name], MD;  Location: [place] CV LAB;  Service: Cardiovascular;  Laterality: Bilateral;  . Pars plana vitrectomy with 25-gauge system    . PERIPHERAL VASCULAR ATHERECTOMY Left 11/15/2018   Procedure: PERIPHERAL VASCULAR ATHERECTOMY;  Surgeon: [name], MD;  Location: [place] CV LAB;  Service: Cardiovascular;  Laterality:  Left;  SFA with STENT  . PERIPHERAL VASCULAR BALLOON ANGIOPLASTY Left 11/15/2018   Procedure: PERIPHERAL VASCULAR BALLOON ANGIOPLASTY;  Surgeon: [name], MD;  Location: [place] CV LAB;  Service: Cardiovascular;  Laterality: Left;  PT  . REVISON OF ARTERIOVENOUS FISTULA Left 01/02/2020   Procedure: REVISON OF LEFT ARM ARTERIOVENOUS FISTULA;  Surgeon: Elam Dutch, MD;  Location: [place];  Service: Vascular;  Laterality: Left;    Allergies as of 04/01/2020      Reactions   Occlusive Silicone Sheets [silicone] Other (See Comments)   "Allergic," per [facility]   Other Other (See Comments)   Unknown reaction to Occlusive adhesive- "Allergic," per MAR   Sulfa Antibiotics    Tape Itching, Other (See Comments)   Use Cloth tape only, please      Medication List       Accurate as of April 01, 2020 11:59 PM. If you have any questions, ask your nurse or doctor.        acetaminophen 500 MG tablet Commonly known as: TYLENOL Take 1,000 mg by mouth 2 (two) times daily.   ADULT NUTRITIONAL SUPPLEMENT PO Take 1 each by mouth daily with supper. Magic Cup with dinner   NEPRO PO Take 1 Can by mouth 2 (two) times daily.   atorvastatin 20 MG tablet Commonly known as: LIPITOR Take 20 mg by mouth daily.   dicyclomine 20 MG tablet Commonly known as: BENTYL Take 20 mg by mouth 3 (three) times daily before meals.   divalproex 125 MG capsule Commonly known as: DEPAKOTE SPRINKLE Take 125 mg by mouth in the morning.   divalproex 125 MG capsule Commonly known as: DEPAKOTE SPRINKLE Take 250 mg by mouth at bedtime.   feeding supplement (PRO-STAT SUGAR FREE 64) Liqd Take 30 mLs by mouth 2 (two) times daily. 9a and 5p   Hectorol 4 MCG/2ML injection Generic drug: doxercalciferol Inject 2 mcg into the vein every Monday, Wednesday, and Friday with hemodialysis.   HYDROcodone-acetaminophen 5-325 MG tablet Commonly known as: NORCO/VICODIN Take 1 tablet by mouth 2 (two) times daily. For Pain     loperamide 2 MG tablet Commonly known as: IMODIUM A-D Take 4 mg by mouth See admin instructions. Take 2 tablets ( 4 mg) by mouth after 1st loose stool as needed for diarrhea   metoprolol tartrate 25 MG tablet Commonly known as: LOPRESSOR Give 1 tablet by mouth at bedtime on Mon Wed ,Fri   metoprolol tartrate 25 MG tablet Commonly known as: LOPRESSOR Give 1 tablet by mouth twice daily on Tues ,Thur ,Sat ,Sun   midodrine 10 MG tablet Commonly known as: PROAMATINE Take one tablet (10 mg) by mouth on Monday, Wednesday, Friday 30 minutes prior to dialysis.   multivitamin Tabs tablet Take 1 tablet by mouth at bedtime.   NON FORMULARY DIET :Regular NAS Diet with Double meat Portion,1200 ml fluid restriction. Diet liberalized to promote PO Intake.   pantoprazole 40 MG tablet Commonly known as: PROTONIX Take 1 tablet (40 mg total) by mouth daily.  sevelamer carbonate 800 MG tablet Commonly known as: RENVELA Take 1,600 mg by mouth 3 (three) times daily with meals.   Vitamin D3 1.25 MG (50000 UT) Caps Take 50,000 Units by mouth every Friday.   warfarin 3 MG tablet Commonly known as: COUMADIN Take 3.5 mg by mouth. Take 3 mg tablet with 0.5 mg to = 3.5 mg every day except Mondays   warfarin 1 MG tablet Commonly known as: COUMADIN Take 1 mg by mouth. Take with a 3 mg tablet to equal 4 mg every Monday.       No orders of the defined types were placed in this encounter.   Immunization History  Administered Date(s) Administered  . Influenza-Unspecified 08/29/2019  . Moderna SARS-COVID-2 Vaccination 12/28/2019, 01/25/2020  . Pneumococcal Polysaccharide-23 10/07/2019    Social History   Tobacco Use  . Smoking status: Former Smoker    Types: Cigarettes    Quit date: 06/30/2004    Years since quitting: 15.7  . Smokeless tobacco: Never Used  Substance Use Topics  . Alcohol use: No     Vitals:   04/01/20 1638  BP: (!) 116/59  Pulse: 62  Resp: 18  Temp: (!) 97.1 F  (36.2 C)  SpO2: 96%   Body mass index is 45.4 kg/m.   Patient Active Problem List   Diagnosis Date Noted  . Anticoagulated on Coumadin 12/24/2019  . Infection due to ESBL-producing Escherichia coli 08/12/2019  . MRSA cellulitis 08/12/2019  . Sacral decubitus ulcer, stage IV ([place]) 08/05/2019  . Infected decubitus ulcer, stage IV ([place]) 08/05/2019  . Hypertensive heart and kidney disease with chronic diastolic congestive heart failure and stage 5 chronic kidney disease on chronic dialysis ([place]) 03/24/2019  . End stage renal disease on dialysis due to type 2 diabetes mellitus ([place]) 03/24/2019  . 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 Anemia due to end stage renal disease ([place]) 03/24/2019  . Protein-calorie malnutrition, severe ([place]) 03/24/2019  . Hyperlipidemia associated with type 2 diabetes mellitus ([place]) 02/22/2019  . Amputation stump infection ([place]) 02/15/2019  . Conjunctivitis of left eye 01/04/2019  . 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[place]) 12/30/2018  . Open wound of left foot   . Encounter for monitoring Coumadin therapy   . OSA (obstructive sleep apnea)   . ESRD on hemodialysis ([place])   . BPH (benign prostatic hyperplasia)   . Hypotension 12/07/2018  . GERD (gastroesophageal reflux disease) 12/07/2018  . Pressure injury of skin 12/03/2018  . Symptomatic anemia 12/02/2018  . Acute on chronic blood loss anemia 12/02/2018  . 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 Lower GI bleeding 12/02/2018  . Heme positive stool   . Acute on chronic diastolic (congestive) heart failure ([place]) 11/19/2018  . Acute CVA (cerebrovascular accident) ([place]) 11/19/2018  . Hypoglycemia 11/19/2018  . Peripheral vascular disease due to secondary diabetes ([place]) 11/19/2018  . Dry gangrene ([place]) 11/19/2018  . 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 DNR (do not resuscitate) discussion   . Goals of care, counseling/discussion   .  Palliative care by specialist   . Acute hypoxemic respiratory failure ([place]) 11/06/2018  . Volume overload 11/06/2018  . Multifocal pneumonia 11/06/2018  . Acute metabolic encephalopathy 81/19/1478  . Physical deconditioning 11/06/2018  . Acute respiratory failure with hypoxia and hypercapnia ([place]) 11/04/2018  . CAP (community acquired pneumonia) 11/04/2018  . Pleural effusion, right 11/04/2018  . Increased ammonia level 11/04/2018  . Abnormal CT of the abdomen  11/04/2018  . Atrial fibrillation with RVR ([place]) 11/04/2018  . Sepsis ([place]) 10/21/2018  . Acute encephalopathy 10/21/2018  . Elevated alkaline phosphatase level 10/21/2018  . Chronic anemia 10/21/2018  . Thrombocytopenia ([place]) 10/21/2018  . Pulmonary HTN ([place]) 03/04/2017  . Aortic valve stenosis 03/04/2017  . Hypertension, accelerated with heart disease, without CHF 01/12/2017  . Dyspnea on exertion 01/12/2017  . ESRD (end stage renal disease) ([place]) 06/07/2012  . Hyperlipidemia 04/24/2011  . HYPERTENSION, BENIGN 10/24/2009  . Type 2 diabetes mellitus with hypertension and end stage renal disease on dialysis ([place]) 10/23/2009  . OBSTRUCTIVE SLEEP APNEA 10/23/2009  . PAF (paroxysmal atrial fibrillation) ([place]) 10/23/2009  . CHF (congestive heart failure) ([place]) 10/23/2009    CMP     Component Value Date/Time   NA 138 01/03/2020 1106   NA 143 06/06/2019 0000   K 3.7 01/03/2020 1106   CL 101 01/03/2020 1106   CO2 25 08/16/2019 1818   GLUCOSE 102 (H) 01/03/2020 1106   BUN 38 (H) 01/03/2020 1106   BUN 44 (A) 06/06/2019 0000   CREATININE 5.90 (H) 01/03/2020 1106   CALCIUM 9.5 08/16/2019 1818   CALCIUM 8.3 (L) 10/25/2008 1450   PROT 7.8 08/16/2019 1818   ALBUMIN 2.9 (L) 08/16/2019 1818   AST 54 (H) 08/16/2019 1818   ALT 38 08/16/2019 1818   ALKPHOS 229 (H) 08/16/2019 1818   BILITOT 1.2 08/16/2019 1818   GFRNONAA 15 (L) 08/16/2019 1818   GFRAA 18 (L) 08/16/2019 1818   Recent Labs    05/28/19 0253 05/28/19 0253  05/29/19 0541 05/29/19 2316 05/30/19 0727 05/30/19 0727 05/31/19 1319 06/06/19 0000 07/27/19 1529 07/27/19 1529 08/16/19 1818 01/02/20 0841 01/03/20 1106  NA 137   < >  --   --  137   < > 136   < > 138   < > 136 140 138  K 3.7   < >  --   --  5.1   < > 3.6   < > 3.5   < > 4.0 3.6 3.7  CL 102   < >  --   --  101   < > 98  --  95*   < > 97* 95* 101  CO2 21*   < >  --   --  17*   < > 24  --  26  --  25  --   --   GLUCOSE 151*   < >  --   --  99   < > 140*  --  143*   < > 75 120* 102*  BUN 80*   < >  --   --  158*   < > 44*   < > 48*   < > 28* 28* 38*  CREATININE 6.12*   < >  --   --  8.32*   < > 4.29*   < > 4.61*   < > 3.59* 3.80* 5.90*  CALCIUM 9.3   < >  --   --  10.0   < > 9.1  --  10.2  --  9.5  --   --   MG 2.0  --  2.1 2.4  --   --   --   --   --   --   --   --   --   PHOS  --   --   --   --  2.6  --  3.0  --   --   --   --   --   --    < > =  values in this interval not displayed.   Recent Labs    05/27/19 0348 05/27/19 0348 05/28/19 0253 05/28/19 0253 05/30/19 0727 05/31/19 1319 08/16/19 1818  AST 55*  --  31  --   --   --  54*  ALT 55*  --  42  --   --   --  38  ALKPHOS 272*  --  261*  --   --   --  229*  BILITOT 0.8  --  0.6  --   --   --  1.2  PROT 7.0  --  6.5  --   --   --  7.8  ALBUMIN 2.4*   < > 2.2*   < > 2.3* 2.3* 2.9*   < > = values in this interval not displayed.   Recent Labs    05/31/19 1319 05/31/19 1319 06/06/19 0000 06/06/19 0000 07/27/19 1529 07/27/19 1529 08/16/19 1818 01/02/20 0841 01/03/20 1106  WBC 10.3  --  11.5  --  18.6*  --  6.9  --   --   NEUTROABS  --   --  8  --  15.5*  --  4.4  --   --   HGB 7.6*   < > 9.3*   < > 12.4*   < > 11.0* 11.9* 10.2*  HCT 24.7*   < > 29*   < > 40.2   < > 34.8* 35.0* 30.0*  MCV 88.5  --   --   --  89.3  --  89.5  --   --   PLT 220   < > 251  --  171  --  193  --   --    < > = values in this interval not displayed.   No results for input(s): CHOL, LDLCALC, TRIG in the last 8760 hours.  Invalid  input(s): HCL No results found for: [facility] Lab Results  Component Value Date   TSH 1.839 05/25/2019   Lab Results  Component Value Date   HGBA1C 3.9 01/20/2020   Lab Results  Component Value Date   CHOL 113 11/11/2018   HDL 39 (L) 11/11/2018   LDLCALC 62 11/11/2018   TRIG 60 11/11/2018   CHOLHDL 2.9 11/11/2018    Significant Diagnostic Results in last 30 days:  No results found.  Assessment and Plan  Atrial fibrillation/anticoagulated on Coumadin-today patient's INR 2.3.  Patient is on Coumadin 4 mg on Monday and 3.5 mg every other day.  Continue current regimen and recheck INR in 1 week      [name] , MD

## 2020-04-03 ENCOUNTER — Encounter: Payer: Self-pay | Admitting: Internal Medicine

## 2020-04-07 ENCOUNTER — Encounter: Payer: Self-pay | Admitting: Internal Medicine

## 2020-04-15 ENCOUNTER — Encounter: Payer: Self-pay | Admitting: Internal Medicine

## 2020-04-15 ENCOUNTER — Non-Acute Institutional Stay (SKILLED_NURSING_FACILITY): Payer: Medicare Other | Admitting: Internal Medicine

## 2020-04-15 DIAGNOSIS — I480 Paroxysmal atrial fibrillation: Secondary | ICD-10-CM | POA: Diagnosis not present

## 2020-04-15 DIAGNOSIS — Z7901 Long term (current) use of anticoagulants: Secondary | ICD-10-CM

## 2020-04-15 NOTE — Progress Notes (Signed)
Location:  [facility] Room Number: 414-D Place of Service:  SNF (31)  Hennie Duos, MD  Patient Care Team: Hennie Duos, MD as PCP - General (Internal Medicine) [name], MD as Consulting Physician (Nephrology) Rehab, [place] ([place], [facility]  Extended Emergency Contact Information Primary Emergency Contact: [name],[name] Address: [place], [place] [address] [name] of [place] Phone: [phone] Work Phone: [phone] Mobile Phone: [phone] Relation: Daughter Secondary Emergency Contact: [place] Phone: [phone] Mobile Phone: [phone] Relation: Other    Allergies: Occlusive silicone sheets [silicone], Other, Sulfa antibiotics, and Tape  Chief Complaint  Patient presents with  . Anticoagulation    Patient is seen for Coumadin management.     HPI: Patient is a 79 y.o. male who is being seen for an INR.  Patient is on Coumadin for atrial fibrillation.  Today patient's INR is 2.2 which is therapeutic  Past Medical History:  Diagnosis Date  . A-fib ([place])   . Anemia   . Aortic stenosis   . Blood transfusion   . BPH (benign prostatic hyperplasia)   . CHF (congestive heart failure) ([place])   . Diarrhea   . DM (diabetes mellitus) ([place])    Type II-   not on medication   . ESRD on hemodialysis ([place])    Started dialysis in 2009  . History of GI bleed    secondary to coumadin  . HTN (hypertension)   . Hyperlipidemia   . OSA (obstructive sleep apnea)    uses CPAP  . Sacral wound    stage II - healed, has a protective dressing on- 12/28/2019  . Secondary hyperparathyroidism of renal origin [facility])     Past Surgical History:  Procedure Laterality Date  . ABDOMINAL AORTOGRAM N/A 11/15/2018   Procedure: ABDOMINAL AORTOGRAM;  Surgeon: [name], MD;  Location: [place] CV LAB;  Service: Cardiovascular;  Laterality:  N/A;  . AMPUTATION Left 12/06/2018   Procedure: AMPUTATION DIGIT LEFT FIFTH TOE;  Surgeon: Angelia Mould, MD;  Location: [place];  Service: Vascular;  Laterality: Left;  . AMPUTATION Bilateral 12/29/2018   Procedure: AMPUTATION ABOVE KNEE;  Surgeon: Waynetta Sandy, MD;  Location: [place];  Service: Vascular;  Laterality: Bilateral;  . APPLICATION OF WOUND VAC Right 02/17/2019   Procedure: Application Of Wound Vac;  Surgeon: Marty Heck, MD;  Location: [place];  Service: Vascular;  Laterality: Right;  . BVT  4/88/89   Left  Basilic Vein Transposition  . CHOLECYSTECTOMY    . EYE SURGERY     Catarct bil  . I & D EXTREMITY Right 02/17/2019   Procedure: Right above the kneee debridement;  Surgeon: Marty Heck, MD;  Location: [place];  Service: Vascular;  Laterality: Right;  . INSERTION OF DIALYSIS CATHETER  05/28/2012   Procedure: INSERTION OF DIALYSIS CATHETER;  Surgeon: Mal Misty, MD;  Location: [place];  Service: Vascular;  Laterality: Right;  . INSERTION OF DIALYSIS CATHETER Right 01/02/2020   Procedure: INSERTION OF DIALYSIS CATHETER RIGHT INTERNAL JUGULAR;  Surgeon: Elam Dutch, MD;  Location: [place];  Service: Vascular;  Laterality: Right;  . INSERTION OF DIALYSIS CATHETER Right 01/03/2020   Procedure: INSERTION OF DIALYSIS CATHETER RIGHT INTERNAL JUGULAR;  Surgeon: Rosetta Posner, MD;  Location: [place];  Service: Vascular;  Laterality: Right;  . Left arm shuntogram.    .  Left forearm loop graft with 6 mm Gore-Tex graft.    . LOWER EXTREMITY ANGIOGRAPHY Bilateral 11/15/2018   Procedure: LOWER EXTREMITY ANGIOGRAPHY;  Surgeon: [name], MD;  Location: [place] CV LAB;  Service: Cardiovascular;  Laterality: Bilateral;  . Pars plana vitrectomy with 25-gauge system    . PERIPHERAL VASCULAR ATHERECTOMY Left 11/15/2018   Procedure: PERIPHERAL VASCULAR ATHERECTOMY;  Surgeon: [name], MD;  Location: [place] CV LAB;  Service: Cardiovascular;   Laterality: Left;  SFA with STENT  . PERIPHERAL VASCULAR BALLOON ANGIOPLASTY Left 11/15/2018   Procedure: PERIPHERAL VASCULAR BALLOON ANGIOPLASTY;  Surgeon: [name], MD;  Location: [place] CV LAB;  Service: Cardiovascular;  Laterality: Left;  PT  . REVISON OF ARTERIOVENOUS FISTULA Left 01/02/2020   Procedure: REVISON OF LEFT ARM ARTERIOVENOUS FISTULA;  Surgeon: Elam Dutch, MD;  Location: [place];  Service: Vascular;  Laterality: Left;    Allergies as of 04/15/2020      Reactions   Occlusive Silicone Sheets [silicone] Other (See Comments)   "Allergic," per [facility]   Other Other (See Comments)   Unknown reaction to Occlusive adhesive- "Allergic," per [facility]   Sulfa Antibiotics    Tape Itching, Other (See Comments)   Use Cloth tape only, please      Medication List       Accurate as of April 15, 2020  4:41 PM. If you have any questions, ask your nurse or doctor.        acetaminophen 500 MG tablet Commonly known as: TYLENOL Take 1,000 mg by mouth 2 (two) times daily.   ADULT NUTRITIONAL SUPPLEMENT PO Take 1 each by mouth daily with supper. Magic Cup with dinner   NEPRO PO Take 1 Can by mouth 2 (two) times daily.   atorvastatin 20 MG tablet Commonly known as: LIPITOR Take 20 mg by mouth daily.   dicyclomine 20 MG tablet Commonly known as: BENTYL Take 20 mg by mouth 3 (three) times daily before meals.   divalproex 125 MG capsule Commonly known as: DEPAKOTE SPRINKLE Take 125 mg by mouth in the morning.   divalproex 125 MG capsule Commonly known as: DEPAKOTE SPRINKLE Take 250 mg by mouth at bedtime.   feeding supplement (PRO-STAT SUGAR FREE 64) Liqd Take 30 mLs by mouth 2 (two) times daily. 9a and 5p   Hectorol 4 MCG/2ML injection Generic drug: doxercalciferol Inject 2 mcg into the vein every Monday, Wednesday, and Friday with hemodialysis.   HYDROcodone-acetaminophen 5-325 MG tablet Commonly known as: NORCO/VICODIN Take 1 tablet by mouth 2 (two) times  daily. For Pain   loperamide 2 MG tablet Commonly known as: IMODIUM A-D Take 4 mg by mouth See admin instructions. Take 2 tablets ( 4 mg) by mouth after 1st loose stool as needed for diarrhea   metoprolol tartrate 25 MG tablet Commonly known as: LOPRESSOR Give 1 tablet by mouth at bedtime on Mon Wed ,Fri   metoprolol tartrate 25 MG tablet Commonly known as: LOPRESSOR Give 1 tablet by mouth twice daily on Tues ,Thur ,Sat ,Sun   midodrine 10 MG tablet Commonly known as: PROAMATINE Take one tablet (10 mg) by mouth on Monday, Wednesday, Friday 30 minutes prior to dialysis.   multivitamin Tabs tablet Take 1 tablet by mouth at bedtime.   NON FORMULARY DIET :Regular NAS Diet with Double meat Portion,1200 ml fluid restriction. Diet liberalized to promote PO Intake.   pantoprazole 40 MG tablet Commonly known as: PROTONIX Take 1 tablet (40 mg total) by  mouth daily.   sevelamer carbonate 800 MG tablet Commonly known as: RENVELA Take 1,600 mg by mouth 3 (three) times daily with meals.   Vitamin D3 1.25 MG (50000 UT) Caps Take 50,000 Units by mouth every Friday.   warfarin 3 MG tablet Commonly known as: COUMADIN Take 3.5 mg by mouth. Take 3 mg tablet with 0.5 mg to = 3.5 mg every day except Mondays   warfarin 1 MG tablet Commonly known as: COUMADIN Take 0.5 mg by mouth. Take 0.5 along with a 3 mg tablet to = 3.5 mg on Tues, Thur, Sat, Sun   warfarin 4 MG tablet Commonly known as: COUMADIN Take 4 mg by mouth every Monday, Wednesday, and Friday.       No orders of the defined types were placed in this encounter.   Immunization History  Administered Date(s) Administered  . Influenza-Unspecified 08/29/2019  . Moderna SARS-COVID-2 Vaccination 12/28/2019, 01/25/2020  . Pneumococcal Polysaccharide-23 10/07/2019    Social History   Tobacco Use  . Smoking status: Former Smoker    Types: Cigarettes    Quit date: 06/30/2004    Years since quitting: 15.8  . Smokeless tobacco:  Never Used  Substance Use Topics  . Alcohol use: No     Vitals:   04/15/20 1534  BP: 110/62  Pulse: 76  Resp: 20  Temp: 97.6 F (36.4 C)  SpO2: 95%   Body mass index is 46.67 kg/m.   Patient Active Problem List   Diagnosis Date Noted  . Anticoagulated on Coumadin 12/24/2019  . Infection due to ESBL-producing Escherichia coli 08/12/2019  . MRSA cellulitis 08/12/2019  . Sacral decubitus ulcer, stage IV ([place]) 08/05/2019  . Infected decubitus ulcer, stage IV ([place]) 08/05/2019  . 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 End stage renal disease on dialysis due to type 2 diabetes mellitus ([place]) 03/24/2019  . 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 ([place]) 03/24/2019  . Anemia due to end stage renal disease ([place]) 03/24/2019  . Protein-calorie malnutrition, severe ([place]) 03/24/2019  . Hyperlipidemia associated with type 2 diabetes mellitus ([place]) 02/22/2019  . Amputation stump infection ([place]) 02/15/2019  . Conjunctivitis of left eye 01/04/2019  . 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[place]) 12/30/2018  . Open wound of left foot   . Encounter for monitoring Coumadin therapy   . OSA (obstructive sleep apnea)   . ESRD on hemodialysis ([place])   . BPH (benign prostatic hyperplasia)   . Hypotension 12/07/2018  . GERD (gastroesophageal reflux disease) 12/07/2018  . Pressure injury of skin 12/03/2018  . Symptomatic anemia 12/02/2018  . Acute on chronic blood loss anemia 12/02/2018  . 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 Lower GI bleeding 12/02/2018  . Heme positive stool   . Acute on chronic diastolic (congestive) heart failure ([place]) 11/19/2018  . Acute CVA (cerebrovascular accident) ([place]) 11/19/2018  . Hypoglycemia 11/19/2018  . Peripheral vascular disease due to secondary diabetes ([place]) 11/19/2018  . Dry gangrene ([place]) 11/19/2018  . Cerebral  thrombosis with cerebral infarction 11/13/2018  . DNR (do not resuscitate) discussion   . Goals of care, counseling/discussion   . Palliative care by specialist   . Acute hypoxemic respiratory failure ([place]) 11/06/2018  . Volume overload 11/06/2018  . Multifocal pneumonia 11/06/2018  . Acute metabolic encephalopathy 50/93/2671  . Physical deconditioning 11/06/2018  . Acute respiratory failure with hypoxia and hypercapnia (H[facility])  11/04/2018  . CAP (community acquired pneumonia) 11/04/2018  . Pleural effusion, right 11/04/2018  . Increased ammonia level 11/04/2018  . Abnormal CT of the abdomen 11/04/2018  . Atrial fibrillation with RVR ([place]) 11/04/2018  . Sepsis ([place]) 10/21/2018  . Acute encephalopathy 10/21/2018  . Elevated alkaline phosphatase level 10/21/2018  . Chronic anemia 10/21/2018  . Thrombocytopenia ([place]) 10/21/2018  . Pulmonary HTN ([place]) 03/04/2017  . Aortic valve stenosis 03/04/2017  . Hypertension, accelerated with heart disease, without CHF 01/12/2017  . Dyspnea on exertion 01/12/2017  . ESRD (end stage renal disease) ([place]) 06/07/2012  . Hyperlipidemia 04/24/2011  . HYPERTENSION, BENIGN 10/24/2009  . Type 2 diabetes mellitus with hypertension and end stage renal disease on dialysis ([place]) 10/23/2009  . OBSTRUCTIVE SLEEP APNEA 10/23/2009  . PAF (paroxysmal atrial fibrillation) ([place]) 10/23/2009  . CHF (congestive heart failure) ([place]) 10/23/2009    CMP     Component Value Date/Time   NA 138 01/03/2020 1106   NA 143 06/06/2019 0000   K 3.7 01/03/2020 1106   CL 101 01/03/2020 1106   CO2 25 08/16/2019 1818   GLUCOSE 102 (H) 01/03/2020 1106   BUN 38 (H) 01/03/2020 1106   BUN 44 (A) 06/06/2019 0000   CREATININE 5.90 (H) 01/03/2020 1106   CALCIUM 9.5 08/16/2019 1818   CALCIUM 8.3 (L) 10/25/2008 1450   PROT 7.8 08/16/2019 1818   ALBUMIN 2.9 (L) 08/16/2019 1818   AST 54 (H) 08/16/2019 1818   ALT 38 08/16/2019 1818   ALKPHOS 229 (H) 08/16/2019 1818   BILITOT 1.2  08/16/2019 1818   GFRNONAA 15 (L) 08/16/2019 1818   GFRAA 18 (L) 08/16/2019 1818   Recent Labs    05/28/19 0253 05/28/19 0253 05/29/19 0541 05/29/19 2316 05/30/19 0727 05/30/19 0727 05/31/19 1319 06/06/19 0000 07/27/19 1529 07/27/19 1529 08/16/19 1818 01/02/20 0841 01/03/20 1106  NA 137   < >  --   --  137   < > 136   < > 138   < > 136 140 138  K 3.7   < >  --   --  5.1   < > 3.6   < > 3.5   < > 4.0 3.6 3.7  CL 102   < >  --   --  101   < > 98  --  95*   < > 97* 95* 101  CO2 21*   < >  --   --  17*   < > 24  --  26  --  25  --   --   GLUCOSE 151*   < >  --   --  99   < > 140*  --  143*   < > 75 120* 102*  BUN 80*   < >  --   --  158*   < > 44*   < > 48*   < > 28* 28* 38*  CREATININE 6.12*   < >  --   --  8.32*   < > 4.29*   < > 4.61*   < > 3.59* 3.80* 5.90*  CALCIUM 9.3   < >  --   --  10.0   < > 9.1  --  10.2  --  9.5  --   --   MG 2.0  --  2.1 2.4  --   --   --   --   --   --   --   --   --  PHOS  --   --   --   --  2.6  --  3.0  --   --   --   --   --   --    < > = values in this interval not displayed.   Recent Labs    05/27/19 0348 05/27/19 0348 05/28/19 0253 05/28/19 0253 05/30/19 0727 05/31/19 1319 08/16/19 1818  AST 55*  --  31  --   --   --  54*  ALT 55*  --  42  --   --   --  38  ALKPHOS 272*  --  261*  --   --   --  229*  BILITOT 0.8  --  0.6  --   --   --  1.2  PROT 7.0  --  6.5  --   --   --  7.8  ALBUMIN 2.4*   < > 2.2*   < > 2.3* 2.3* 2.9*   < > = values in this interval not displayed.   Recent Labs    05/31/19 1319 05/31/19 1319 06/06/19 0000 06/06/19 0000 07/27/19 1529 07/27/19 1529 08/16/19 1818 01/02/20 0841 01/03/20 1106  WBC 10.3  --  11.5  --  18.6*  --  6.9  --   --   NEUTROABS  --   --  8  --  15.5*  --  4.4  --   --   HGB 7.6*   < > 9.3*   < > 12.4*   < > 11.0* 11.9* 10.2*  HCT 24.7*   < > 29*   < > 40.2   < > 34.8* 35.0* 30.0*  MCV 88.5  --   --   --  89.3  --  89.5  --   --   PLT 220   < > 251  --  171  --  193  --   --    <  > = values in this interval not displayed.   No results for input(s): CHOL, LDLCALC, TRIG in the last 8760 hours.  Invalid input(s): HCL No results found for: [facility] Lab Results  Component Value Date   TSH 1.839 05/25/2019   Lab Results  Component Value Date   HGBA1C 3.9 01/20/2020   Lab Results  Component Value Date   CHOL 113 11/11/2018   HDL 39 (L) 11/11/2018   LDLCALC 62 11/11/2018   TRIG 60 11/11/2018   CHOLHDL 2.9 11/11/2018    Significant Diagnostic Results in last 30 days:  No results found.  Assessment and Plan  Paroxysmal atrial fibrillation/chronic anticoagulation on Coumadin-patient's INR today is 2.2 which is therapeutic, on 4 mg of Coumadin Monday Wednesday Friday and 3.5 mg of Coumadin every other day.  We will continue the same regimen and repeat in 1 week     Hennie Duos , MD

## 2020-04-22 ENCOUNTER — Non-Acute Institutional Stay (SKILLED_NURSING_FACILITY): Payer: Medicare Other | Admitting: Internal Medicine

## 2020-04-22 ENCOUNTER — Encounter: Payer: Self-pay | Admitting: Internal Medicine

## 2020-04-22 DIAGNOSIS — I480 Paroxysmal atrial fibrillation: Secondary | ICD-10-CM

## 2020-04-22 DIAGNOSIS — Z7901 Long term (current) use of anticoagulants: Secondary | ICD-10-CM | POA: Diagnosis not present

## 2020-04-25 ENCOUNTER — Encounter: Payer: Self-pay | Admitting: Internal Medicine

## 2020-04-25 ENCOUNTER — Non-Acute Institutional Stay (SKILLED_NURSING_FACILITY): Payer: Medicare Other | Admitting: Internal Medicine

## 2020-04-25 DIAGNOSIS — Z992 Dependence on renal dialysis: Secondary | ICD-10-CM

## 2020-04-25 DIAGNOSIS — Z89611 Acquired absence of right leg above knee: Secondary | ICD-10-CM

## 2020-04-25 DIAGNOSIS — I480 Paroxysmal atrial fibrillation: Secondary | ICD-10-CM

## 2020-04-25 DIAGNOSIS — E785 Hyperlipidemia, unspecified: Secondary | ICD-10-CM

## 2020-04-25 DIAGNOSIS — Z89612 Acquired absence of left leg above knee: Secondary | ICD-10-CM

## 2020-04-25 DIAGNOSIS — N186 End stage renal disease: Secondary | ICD-10-CM

## 2020-04-25 DIAGNOSIS — I5032 Chronic diastolic (congestive) heart failure: Secondary | ICD-10-CM | POA: Diagnosis not present

## 2020-04-25 DIAGNOSIS — E1169 Type 2 diabetes mellitus with other specified complication: Secondary | ICD-10-CM

## 2020-04-25 NOTE — Progress Notes (Signed)
Location:    [place] Room Number: 414/D Place of Service:  SNF [phone]) Provider:  Leda Min, MD  Patient Care Team: Hennie Duos, MD as PCP - General (Internal Medicine) [name], MD as Consulting Physician (Nephrology) Re[facility], [facility], [facility]  Extended Emergency Contact Information Primary Emergency Contact: [name],[name] Address: [place], [place] [address] [name] of [place] Phone: [phone] Work Phone: [phone] Mobile Phone: [phone] Relation: Daughter Secondary Emergency Contact: [place] Phone: [phone] Mobile Phone: [phone] Relation: Other  Code Status:  DNR Goals of care: Advanced Directive information Advanced Directives 04/25/2020  Does Patient Have a Medical Advance Directive? Yes  Type of Advance Directive Out of facility DNR (pink MOST or yellow form)  Does patient want to make changes to medical advance directive? No - Patient declined  Copy of [place] in Chart? -  Would patient like information on creating a medical advance directive? -  Pre-existing out of facility DNR order (yellow form or pink MOST form) Yellow form placed in chart (order not valid for inpatient use)     Chief Complaint  Patient presents with  . Medical Management of Chronic Issues    Routine visit of medical management  Medical management of chronic medical conditions including end-stage renal disease on dialysis-peripheral vascular disease status post AKA bilateral-as well as history of dementia hypertension and atrial fibrillation.    HPI:  Pt is a 79 y.o. male seen today for medical management of chronic diseases.  As noted above.  Nursing does not report any recent issues.  He continues on chronic Coumadin with a history of atrial fibrillation INR has been therapeutic it was  most recently 2.2 on April 26 update INR is pending.  He continues on Lopressor for rate control-he receives his twice a day on nondialysis days and once a day on dialysis days-his pulse appears to be controlled.  In regards to his other medical issues he has a history of peripheral vascular disease and bilateral AKA's-he does take Vicodin for pain and this appears to be infected.  He also has a history of dementia with behaviors but this appears to be well controlled on Depakote 250 mg nightly.  Apparently he is tolerating dialysis well he receives this 3 times a week-he does have orders for midodrine prior to dialysis because of some hypotension in the past.  Currently he is lying in bed comfortably does not really have any complaints he continues to be pleasant somewhat confused but no evidence of behaviors.     Past Medical History:  Diagnosis Date  . A-fib ([place])   . Anemia   . Aortic stenosis   . Blood transfusion   . BPH (benign prostatic hyperplasia)   . CHF (congestive heart failure) ([place])   . Diarrhea   . 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 ESRD on hemodialysis ([place])    Started dialysis in 2009  . History of GI bleed    secondary to coumadin  . HTN (hypertension)   . Hyperlipidemia   . OSA (obstructive sleep apnea)    uses CPAP  . Sacral wound    stage II - healed, has a protective dressing on- 12/28/2019  . Secondary hyperparathyroidism of renal origin [facility])    Past Surgical History:  Procedure Laterality Date  . ABDOMINAL AORTOGRAM N/A 11/15/2018   Procedure: ABDOMINAL AORTOGRAM;  Surgeon: [name], MD;  Location: [place] [facility];  Service: Cardiovascular;  Laterality: N/A;  . AMPUTATION Left 12/06/2018   Procedure: AMPUTATION DIGIT LEFT FIFTH TOE;  Surgeon: Angelia Mould, MD;  Location: [place];  Service: Vascular;  Laterality: Left;  . AMPUTATION Bilateral 12/29/2018   Procedure: AMPUTATION ABOVE KNEE;  Surgeon:  Waynetta Sandy, MD;  Location: [place];  Service: Vascular;  Laterality: Bilateral;  . APPLICATION OF WOUND VAC Right 02/17/2019   Procedure: Application Of Wound Vac;  Surgeon: Marty Heck, MD;  Location: [place];  Service: Vascular;  Laterality: Right;  . BVT  08/27/50   Left  Basilic Vein Transposition  . CHOLECYSTECTOMY    . EYE SURGERY     Catarct bil  . I & D EXTREMITY Right 02/17/2019   Procedure: Right above the kneee debridement;  Surgeon: Marty Heck, MD;  Location: [place];  Service: Vascular;  Laterality: Right;  . INSERTION OF DIALYSIS CATHETER  05/28/2012   Procedure: INSERTION OF DIALYSIS CATHETER;  Surgeon: Mal Misty, MD;  Location: [place];  Service: Vascular;  Laterality: Right;  . INSERTION OF DIALYSIS CATHETER Right 01/02/2020   Procedure: INSERTION OF DIALYSIS CATHETER RIGHT INTERNAL JUGULAR;  Surgeon: Elam Dutch, MD;  Location: [place];  Service: Vascular;  Laterality: Right;  . INSERTION OF DIALYSIS CATHETER Right 01/03/2020   Procedure: INSERTION OF DIALYSIS CATHETER RIGHT INTERNAL JUGULAR;  Surgeon: Rosetta Posner, MD;  Location: [place];  Service: Vascular;  Laterality: Right;  . Left arm shuntogram.    . Left forearm loop graft with 6 mm Gore-Tex graft.    . LOWER EXTREMITY ANGIOGRAPHY Bilateral 11/15/2018   Procedure: LOWER EXTREMITY ANGIOGRAPHY;  Surgeon: [name], MD;  Location: [place] [facility];  Service: Cardiovascular;  Laterality: Bilateral;  . Pars plana vitrectomy with 25-gauge system    . PERIPHERAL VASCULAR ATHERECTOMY Left 11/15/2018   Procedure: PERIPHERAL VASCULAR ATHERECTOMY;  Surgeon: [name], MD;  Location: [place] [facility];  Service: Cardiovascular;  Laterality: Left;  SFA with STENT  . PERIPHERAL VASCULAR BALLOON ANGIOPLASTY Left 11/15/2018   Procedure: PERIPHERAL VASCULAR BALLOON ANGIOPLASTY;  Surgeon: [name], MD;  Location: [place] [facility];  Service: Cardiovascular;  Laterality: Left;  PT  .  REVISON OF ARTERIOVENOUS FISTULA Left 01/02/2020   Procedure: REVISON OF LEFT ARM ARTERIOVENOUS FISTULA;  Surgeon: Elam Dutch, MD;  Location: [place];  Service: Vascular;  Laterality: Left;    Allergies  Allergen Reactions  . Occlusive Silicone Sheets [Silicone] Other (See Comments)    "Allergic," per MAR  . Other Other (See Comments)    Unknown reaction to Occlusive adhesive- "Allergic," per MAR  . Sulfa Antibiotics   . Tape Itching and Other (See Comments)    Use Cloth tape only, please    Allergies as of 04/25/2020      Reactions   Occlusive Silicone Sheets [silicone] Other (See Comments)   "Allergic," per [facility]   Other Other (See Comments)   Unknown reaction to Occlusive adhesive- "Allergic," per MAR   Sulfa Antibiotics    Tape Itching, Other (See Comments)   Use Cloth tape only, please      Medication List       Accurate as of April 25, 2020  4:46 PM. If you have any questions, ask your nurse or doctor.  acetaminophen 500 MG tablet Commonly known as: TYLENOL Take 1,000 mg by mouth 2 (two) times daily.   ADULT NUTRITIONAL SUPPLEMENT PO Take 1 each by mouth daily with supper. Magic Cup with dinner   NEPRO PO Take 1 Can by mouth 2 (two) times daily.   atorvastatin 20 MG tablet Commonly known as: LIPITOR Take 20 mg by mouth daily.   dicyclomine 20 MG tablet Commonly known as: BENTYL Take 20 mg by mouth 3 (three) times daily before meals.   divalproex 125 MG capsule Commonly known as: DEPAKOTE SPRINKLE Take 250 mg by mouth at bedtime. What changed: Another medication with the same name was removed. Continue taking this medication, and follow the directions you see here. Changed by: Granville Lewis, PA-C   feeding supplement (PRO-STAT SUGAR FREE 64) Liqd Take 30 mLs by mouth 2 (two) times daily. 9a and 5p   Hectorol 4 MCG/2ML injection Generic drug: doxercalciferol Inject 2 mcg into the vein every Monday, Wednesday, and Friday with hemodialysis.     HYDROcodone-acetaminophen 5-325 MG tablet Commonly known as: NORCO/VICODIN Take 1 tablet by mouth 2 (two) times daily. For Pain   loperamide 2 MG tablet Commonly known as: IMODIUM A-D Take 4 mg by mouth See admin instructions. Take 2 tablets ( 4 mg) by mouth after 1st loose stool as needed for diarrhea   metoprolol tartrate 25 MG tablet Commonly known as: LOPRESSOR Give 1 tablet by mouth at bedtime on Mon Wed ,Fri   metoprolol tartrate 25 MG tablet Commonly known as: LOPRESSOR Give 1 tablet by mouth twice daily on Tues ,Thur ,Sat ,Sun   midodrine 10 MG tablet Commonly known as: PROAMATINE Take one tablet (10 mg) by mouth on Monday, Wednesday, Friday 30 minutes prior to dialysis.   multivitamin Tabs tablet Take 1 tablet by mouth at bedtime.   NON FORMULARY DIET :Regular NAS Diet with Double meat Portion,1200 ml fluid restriction. Diet liberalized to promote PO Intake.   pantoprazole 40 MG tablet Commonly known as: PROTONIX Take 1 tablet (40 mg total) by mouth daily.   sevelamer carbonate 800 MG tablet Commonly known as: RENVELA Take 1,600 mg by mouth 3 (three) times daily with meals.   Vitamin D3 1.25 MG (50000 UT) Caps Take 50,000 Units by mouth every Friday.   warfarin 3 MG tablet Commonly known as: COUMADIN Take 3 mg tablet with 0.5 mg to = 3.5 mg tues. Thurs, sat, sun   warfarin 1 MG tablet Commonly known as: COUMADIN Take 0.5 mg by mouth. Take 0.5 along with a 3 mg tablet to = 3.5 mg on Tues, Thur, Sat, Sun   warfarin 4 MG tablet Commonly known as: COUMADIN Take 4 mg by mouth every Monday, Wednesday, and Friday.       Review of Systems   This is somewhat limited secondary to dementia-provided by nursing as well.  General is not complaining of any fever or chills.  Skin does not complain of rashes itching or diaphoresis.  Head ears eyes nose mouth and throat does not complain of visual changes or sore throat.  Respiratory does not complain of being  short of breath or having a cough.  Cardiac no complaints of chest pain or edema.  GI is not complaining of abdominal pain nausea vomiting diarrhea constipation.  GU no complaints of dysuria he does receive dialysis with a history of end-stage renal disease.  Musculoskeletal is status post bilateral AKA's does not complain of pain at this time he does receive Vicodin twice  a day.  Neurologic does not complain of dizziness headache numbness syncope.  And psych does not complain of being depressed or anxious appears to be in good spirits-his only complaint today is sometimes the noise in the hallway makes it difficult for him to rest.    Immunization History  Administered Date(s) Administered  . Influenza-Unspecified 08/29/2019  . Moderna SARS-COVID-2 Vaccination 12/28/2019, 01/25/2020  . Pneumococcal Polysaccharide-23 10/07/2019   Pertinent  Health Maintenance Due  Topic Date Due  . FOOT EXAM  Never done  . HEMOGLOBIN A1C  07/19/2020  . INFLUENZA VACCINE  07/28/2020  . PNA vac Low Risk Adult (2 of 2 - PCV13) 10/06/2020  . OPHTHALMOLOGY EXAM  12/18/2020  . URINE MICROALBUMIN  Discontinued   No flowsheet data found. Functional Status Survey:    Vitals:   04/25/20 1635  BP: 130/60  Pulse: 66  Resp: 18  Temp: (!) 96.7 F (35.9 C)  TempSrc: Oral  SpO2: 95%  Weight: 114 lb 12.8 oz (52.1 kg)  Height: 3' (0.914 m)   Body mass index is 62.28 kg/m. Physical Exam In general this is a pleasant elderly male no distress lying comfortably in bed.  His skin is warm and dry.  Dialysis catheter was difficult to assess secondary to patient having clothing over it which was difficult to remove  Oropharynx is clear mucous membranes moist he has numerous extractions.  Chest is clear to auscultation there is no labored breathing air entry is somewhat shallow.  Heart is irregular irregular rate and rhythm with a baseline systolic murmur he does not have evidence of lower extremity  edema-he does have bilateral AKA's.  Abdomen is soft nontender with positive bowel sounds.  Musculoskeletal is status post bilateral AKA's does move his upper extremities appears at baseline.  Neurologic is grossly intact cannot really appreciate lateralizing findings-his speech is pretty clear.  Psych he is pleasant appropriate does follow simple verbal commands.   Labs reviewed: Recent Labs    05/28/19 0253 05/28/19 0253 05/29/19 0541 05/29/19 2316 05/30/19 0727 05/30/19 0727 05/31/19 1319 06/06/19 0000 07/27/19 1529 07/27/19 1529 08/16/19 1818 01/02/20 0841 01/03/20 1106  NA 137   < >  --   --  137   < > 136   < > 138   < > 136 140 138  K 3.7   < >  --   --  5.1   < > 3.6   < > 3.5   < > 4.0 3.6 3.7  CL 102   < >  --   --  101   < > 98  --  95*   < > 97* 95* 101  CO2 21*   < >  --   --  17*   < > 24  --  26  --  25  --   --   GLUCOSE 151*   < >  --   --  99   < > 140*  --  143*   < > 75 120* 102*  BUN 80*   < >  --   --  158*   < > 44*   < > 48*   < > 28* 28* 38*  CREATININE 6.12*   < >  --   --  8.32*   < > 4.29*   < > 4.61*   < > 3.59* 3.80* 5.90*  CALCIUM 9.3   < >  --   --  10.0   < >  9.1  --  10.2  --  9.5  --   --   MG 2.0  --  2.1 2.4  --   --   --   --   --   --   --   --   --   PHOS  --   --   --   --  2.6  --  3.0  --   --   --   --   --   --    < > = values in this interval not displayed.   Recent Labs    05/27/19 0348 05/27/19 0348 05/28/19 0253 05/28/19 0253 05/30/19 0727 05/31/19 1319 08/16/19 1818  AST 55*  --  31  --   --   --  54*  ALT 55*  --  42  --   --   --  38  ALKPHOS 272*  --  261*  --   --   --  229*  BILITOT 0.8  --  0.6  --   --   --  1.2  PROT 7.0  --  6.5  --   --   --  7.8  ALBUMIN 2.4*   < > 2.2*   < > 2.3* 2.3* 2.9*   < > = values in this interval not displayed.   Recent Labs    05/31/19 1319 05/31/19 1319 06/06/19 0000 06/06/19 0000 07/27/19 1529 07/27/19 1529 08/16/19 1818 01/02/20 0841 01/03/20 1106  WBC 10.3  --   11.5  --  18.6*  --  6.9  --   --   NEUTROABS  --   --  8  --  15.5*  --  4.4  --   --   HGB 7.6*   < > 9.3*   < > 12.4*   < > 11.0* 11.9* 10.2*  HCT 24.7*   < > 29*   < > 40.2   < > 34.8* 35.0* 30.0*  MCV 88.5  --   --   --  89.3  --  89.5  --   --   PLT 220   < > 251  --  171  --  193  --   --    < > = values in this interval not displayed.   Lab Results  Component Value Date   TSH 1.839 05/25/2019   Lab Results  Component Value Date   HGBA1C 3.9 01/20/2020   Lab Results  Component Value Date   CHOL 113 11/11/2018   HDL 39 (L) 11/11/2018   LDLCALC 62 11/11/2018   TRIG 60 11/11/2018   CHOLHDL 2.9 11/11/2018    Significant Diagnostic Results in last 30 days:  No results found.  Assessment/Plan  #1 history of atrial fibrillation this appears rate controlled on Lopressor 25 mg twice daily on nondialysis days and receives once a day on dialysis days after dialysis.  He is on Coumadin 3.5 mg alternating with 4 mg-last INR was 2.2 update INR is pending.  2.  History of end-stage renal disease on chronic dialysis this appears to be stable-he is on midodrine on dialysis days secondary to hypotension concerns.  3.  History of dementia with behaviors continues on Depakote 250 mg nightly this appears to be stable at this point nursing did not really report any increase in behaviors-.  4.-Hypertension-continues on Lopressor-she is on midodrine on dialysis days-blood pressures appear to be pretty stable recently 112/56-130/60-124/50-106/68- I do not see a whole  lot of systolics under 736 although he does have a few of these  #5 History of GERD this appears stable on Protonix 40 mg a day.  6.  History of CHF fluid status appears to be stable this is monitored by dialysis.  7.  History of hyperlipidemia he is on atorvastatin 20 mg a day-  Will write orders to try to obtain updated most recent blood work including CBC BMP lipid panel and A1c from dialysis if  possible  [id number]

## 2020-04-26 ENCOUNTER — Other Ambulatory Visit: Payer: Self-pay | Admitting: Internal Medicine

## 2020-04-26 MED ORDER — HYDROCODONE-ACETAMINOPHEN 5-325 MG PO TABS: 1.0000 | ORAL_TABLET | Freq: Two times a day (BID) | ORAL | 0 refills | Status: DC

## 2020-04-28 ENCOUNTER — Encounter: Payer: Self-pay | Admitting: Internal Medicine

## 2020-04-28 NOTE — Progress Notes (Signed)
Location:      Place of Service:     Hennie Duos, MD  Patient Care Team: Hennie Duos, MD as PCP - General (Internal Medicine) [name], MD as Consulting Physician (Nephrology) Rehab, [place] ([place], [facility]  Extended Emergency Contact Information Primary Emergency Contact: [name],[name] Address: [place], [place] [address] [name] of [place] Phone: [phone] Work Phone: [phone] Mobile Phone: [phone] Relation: Daughter Secondary Emergency Contact: [place] Phone: [phone] Mobile Phone: [phone] Relation: Other    Allergies: Occlusive silicone sheets [silicone], Other, Sulfa antibiotics, and Tape  Chief Complaint  Patient presents with  . Acute Visit    HPI: Patient is 79 y.o. male with atrial fibrillation who is being seen for an INR check.  Today patient's INR is 2.2 is therapeutic  Past Medical History:  Diagnosis Date  . A-fib ([place])   . Anemia   . Aortic stenosis   . Blood transfusion   . BPH (benign prostatic hyperplasia)   . CHF (congestive heart failure) ([place])   . Diarrhea   . DM (diabetes mellitus) ([place])    Type II-   not on medication   . ESRD on hemodialysis ([place])    Started dialysis in 2009  . History of GI bleed    secondary to coumadin  . HTN (hypertension)   . Hyperlipidemia   . OSA (obstructive sleep apnea)    uses CPAP  . Sacral wound    stage II - healed, has a protective dressing on- 12/28/2019  . Secondary hyperparathyroidism of renal origin [facility])     Past Surgical History:  Procedure Laterality Date  . ABDOMINAL AORTOGRAM N/A 11/15/2018   Procedure: ABDOMINAL AORTOGRAM;  Surgeon: [name], MD;  Location: [place] [facility];  Service: Cardiovascular;  Laterality: N/A;  . AMPUTATION Left 12/06/2018   Procedure: AMPUTATION DIGIT LEFT FIFTH TOE;  Surgeon: Angelia Mould, MD;  Location: [employer];   Service: Vascular;  Laterality: Left;  . AMPUTATION Bilateral 12/29/2018   Procedure: AMPUTATION ABOVE KNEE;  Surgeon: Waynetta Sandy, MD;  Location: [place];  Service: Vascular;  Laterality: Bilateral;  . APPLICATION OF WOUND VAC Right 02/17/2019   Procedure: Application Of Wound Vac;  Surgeon: Marty Heck, MD;  Location: [place];  Service: Vascular;  Laterality: Right;  . BVT  01/02/25   Left  Basilic Vein Transposition  . CHOLECYSTECTOMY    . EYE SURGERY     Catarct bil  . I & D EXTREMITY Right 02/17/2019   Procedure: Right above the kneee debridement;  Surgeon: Marty Heck, MD;  Location: [place];  Service: Vascular;  Laterality: Right;  . INSERTION OF DIALYSIS CATHETER  05/28/2012   Procedure: INSERTION OF DIALYSIS CATHETER;  Surgeon: Mal Misty, MD;  Location: [place];  Service: Vascular;  Laterality: Right;  . INSERTION OF DIALYSIS CATHETER Right 01/02/2020   Procedure: INSERTION OF DIALYSIS CATHETER RIGHT INTERNAL JUGULAR;  Surgeon: Elam Dutch, MD;  Location: [place];  Service: Vascular;  Laterality: Right;  . INSERTION OF DIALYSIS CATHETER Right 01/03/2020   Procedure: INSERTION OF DIALYSIS CATHETER RIGHT INTERNAL JUGULAR;  Surgeon: Rosetta Posner, MD;  Location: [place];  Service: Vascular;  Laterality: Right;  . Left arm shuntogram.    . Left forearm loop graft with 6 mm Gore-Tex graft.    . LOWER EXTREMITY ANGIOGRAPHY Bilateral 11/15/2018  Procedure: LOWER EXTREMITY ANGIOGRAPHY;  Surgeon: [name], MD;  Location: [place] [facility];  Service: Cardiovascular;  Laterality: Bilateral;  . Pars plana vitrectomy with 25-gauge system    . PERIPHERAL VASCULAR ATHERECTOMY Left 11/15/2018   Procedure: PERIPHERAL VASCULAR ATHERECTOMY;  Surgeon: [name], MD;  Location: [place] [facility];  Service: Cardiovascular;  Laterality: Left;  SFA with STENT  . PERIPHERAL VASCULAR BALLOON ANGIOPLASTY Left 11/15/2018   Procedure: PERIPHERAL VASCULAR BALLOON  ANGIOPLASTY;  Surgeon: [name], MD;  Location: [place] [facility];  Service: Cardiovascular;  Laterality: Left;  PT  . REVISON OF ARTERIOVENOUS FISTULA Left 01/02/2020   Procedure: REVISON OF LEFT ARM ARTERIOVENOUS FISTULA;  Surgeon: Elam Dutch, MD;  Location: [place];  Service: Vascular;  Laterality: Left;    Allergies as of 04/22/2020      Reactions   Occlusive Silicone Sheets [silicone] Other (See Comments)   "Allergic," per [facility]   Other Other (See Comments)   Unknown reaction to Occlusive adhesive- "Allergic," per [facility]   Sulfa Antibiotics    Tape Itching, Other (See Comments)   Use Cloth tape only, please      Medication List       Accurate as of April 22, 2020 11:59 PM. If you have any questions, ask your nurse or doctor.        acetaminophen 500 MG tablet Commonly known as: TYLENOL Take 1,000 mg by mouth 2 (two) times daily.   ADULT NUTRITIONAL SUPPLEMENT PO Take 1 each by mouth daily with supper. Magic Cup with dinner   NEPRO PO Take 1 Can by mouth 2 (two) times daily.   atorvastatin 20 MG tablet Commonly known as: LIPITOR Take 20 mg by mouth daily.   dicyclomine 20 MG tablet Commonly known as: BENTYL Take 20 mg by mouth 3 (three) times daily before meals.   divalproex 125 MG capsule Commonly known as: DEPAKOTE SPRINKLE Take 125 mg by mouth in the morning.   divalproex 125 MG capsule Commonly known as: DEPAKOTE SPRINKLE Take 250 mg by mouth at bedtime.   feeding supplement (PRO-STAT SUGAR FREE 64) Liqd Take 30 mLs by mouth 2 (two) times daily. 9a and 5p   Hectorol 4 MCG/2ML injection Generic drug: doxercalciferol Inject 2 mcg into the vein every Monday, Wednesday, and Friday with hemodialysis.   HYDROcodone-acetaminophen 5-325 MG tablet Commonly known as: NORCO/VICODIN Take 1 tablet by mouth 2 (two) times daily. For Pain   loperamide 2 MG tablet Commonly known as: IMODIUM A-D Take 4 mg by mouth See admin instructions. Take 2 tablets ( 4  mg) by mouth after 1st loose stool as needed for diarrhea   metoprolol tartrate 25 MG tablet Commonly known as: LOPRESSOR Give 1 tablet by mouth at bedtime on Mon Wed ,Fri   metoprolol tartrate 25 MG tablet Commonly known as: LOPRESSOR Give 1 tablet by mouth twice daily on Tues ,Thur ,Sat ,Sun   midodrine 10 MG tablet Commonly known as: PROAMATINE Take one tablet (10 mg) by mouth on Monday, Wednesday, Friday 30 minutes prior to dialysis.   multivitamin Tabs tablet Take 1 tablet by mouth at bedtime.   NON FORMULARY DIET :Regular NAS Diet with Double meat Portion,1200 ml fluid restriction. Diet liberalized to promote PO Intake.   pantoprazole 40 MG tablet Commonly known as: PROTONIX Take 1 tablet (40 mg total) by mouth daily.   sevelamer carbonate 800 MG tablet Commonly known as: RENVELA Take 1,600 mg by mouth 3 (three) times  daily with meals.   Vitamin D3 1.25 MG (50000 UT) Caps Take 50,000 Units by mouth every Friday.   warfarin 3 MG tablet Commonly known as: COUMADIN Take 3 mg tablet with 0.5 mg to = 3.5 mg tues. Thurs, sat, sun   warfarin 1 MG tablet Commonly known as: COUMADIN Take 0.5 mg by mouth. Take 0.5 along with a 3 mg tablet to = 3.5 mg on Tues, Thur, Sat, Sun   warfarin 4 MG tablet Commonly known as: COUMADIN Take 4 mg by mouth every Monday, Wednesday, and Friday.       No orders of the defined types were placed in this encounter.   Immunization History  Administered Date(s) Administered  . Influenza-Unspecified 08/29/2019  . Moderna SARS-COVID-2 Vaccination 12/28/2019, 01/25/2020  . Pneumococcal Polysaccharide-23 10/07/2019    Social History   Tobacco Use  . Smoking status: Former Smoker    Types: Cigarettes    Quit date: 06/30/2004    Years since quitting: 15.8  . Smokeless tobacco: Never Used  Substance Use Topics  . Alcohol use: No     Vitals:   04/28/20 1939  BP: 110/76  Pulse: 70  Resp: 20  Temp: 97.6 F (36.4 C)   There is  no height or weight on file to calculate BMI.   Patient Active Problem List   Diagnosis Date Noted  . Anticoagulated on Coumadin 12/24/2019  . Infection due to ESBL-producing Escherichia coli 08/12/2019  . MRSA cellulitis 08/12/2019  . Sacral decubitus ulcer, stage IV ([place]) 08/05/2019  . Infected decubitus ulcer, stage IV ([place]) 08/05/2019  . Hypertensive heart and kidney disease with chronic diastolic congestive heart failure and stage 5 chronic kidney disease on chronic dialysis ([place]) 03/24/2019  . End stage renal disease on dialysis due to type 2 diabetes mellitus ([place]) 03/24/2019  . 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 Anemia due to end stage renal disease ([place]) 03/24/2019  . Protein-calorie malnutrition, severe ([place]) 03/24/2019  . Hyperlipidemia associated with type 2 diabetes mellitus ([place]) 02/22/2019  . Amputation stump infection ([place]) 02/15/2019  . Conjunctivitis of left eye 01/04/2019  . 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[place]) 12/30/2018  . Open wound of left foot   . Encounter for monitoring Coumadin therapy   . OSA (obstructive sleep apnea)   . ESRD on hemodialysis ([place])   . BPH (benign prostatic hyperplasia)   . Hypotension 12/07/2018  . GERD (gastroesophageal reflux disease) 12/07/2018  . Pressure injury of skin 12/03/2018  . Symptomatic anemia 12/02/2018  . Acute on chronic blood loss anemia 12/02/2018  . 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[place]) 12/02/2018  . Lower GI bleeding 12/02/2018  . Heme positive stool   . Acute on chronic diastolic (congestive) heart failure ([place]) 11/19/2018  . Acute CVA (cerebrovascular accident) ([place]) 11/19/2018  . Hypoglycemia 11/19/2018  . Peripheral vascular disease due to secondary diabetes ([place]) 11/19/2018  . Dry gangrene ([place]) 11/19/2018  . 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 DNR (do not resuscitate) discussion   . Goals of care, counseling/discussion   . Palliative care by  specialist   . Acute hypoxemic respiratory failure ([place]) 11/06/2018  . Volume overload 11/06/2018  . Multifocal pneumonia 11/06/2018  . Acute metabolic encephalopathy 82/99/3716  . Physical deconditioning 11/06/2018  . Acute respiratory failure with hypoxia and hypercapnia ([place]) 11/04/2018  . CAP (community acquired pneumonia) 11/04/2018  . Pleural effusion, right 11/04/2018  . Increased ammonia level 11/04/2018  . Abnormal CT  of the abdomen 11/04/2018  . Atrial fibrillation with RVR ([place]) 11/04/2018  . Sepsis ([place]) 10/21/2018  . Acute encephalopathy 10/21/2018  . Elevated alkaline phosphatase level 10/21/2018  . Chronic anemia 10/21/2018  . Thrombocytopenia ([place]) 10/21/2018  . Pulmonary HTN ([place]) 03/04/2017  . Aortic valve stenosis 03/04/2017  . Hypertension, accelerated with heart disease, without CHF 01/12/2017  . Dyspnea on exertion 01/12/2017  . ESRD (end stage renal disease) ([place]) 06/07/2012  . Hyperlipidemia 04/24/2011  . HYPERTENSION, BENIGN 10/24/2009  . Type 2 diabetes mellitus with hypertension and end stage renal disease on dialysis ([place]) 10/23/2009  . OBSTRUCTIVE SLEEP APNEA 10/23/2009  . PAF (paroxysmal atrial fibrillation) ([place]) 10/23/2009  . CHF (congestive heart failure) ([place]) 10/23/2009    CMP     Component Value Date/Time   NA 138 01/03/2020 1106   NA 143 06/06/2019 0000   K 3.7 01/03/2020 1106   CL 101 01/03/2020 1106   CO2 25 08/16/2019 1818   GLUCOSE 102 (H) 01/03/2020 1106   BUN 38 (H) 01/03/2020 1106   BUN 44 (A) 06/06/2019 0000   CREATININE 5.90 (H) 01/03/2020 1106   CALCIUM 9.5 08/16/2019 1818   CALCIUM 8.3 (L) 10/25/2008 1450   PROT 7.8 08/16/2019 1818   ALBUMIN 2.9 (L) 08/16/2019 1818   AST 54 (H) 08/16/2019 1818   ALT 38 08/16/2019 1818   ALKPHOS 229 (H) 08/16/2019 1818   BILITOT 1.2 08/16/2019 1818   GFRNONAA 15 (L) 08/16/2019 1818   GFRAA 18 (L) 08/16/2019 1818   Recent Labs    05/28/19 0253 05/28/19 0253 05/29/19 0541 05/29/19  2316 05/30/19 0727 05/30/19 0727 05/31/19 1319 06/06/19 0000 07/27/19 1529 07/27/19 1529 08/16/19 1818 01/02/20 0841 01/03/20 1106  NA 137   < >  --   --  137   < > 136   < > 138   < > 136 140 138  K 3.7   < >  --   --  5.1   < > 3.6   < > 3.5   < > 4.0 3.6 3.7  CL 102   < >  --   --  101   < > 98  --  95*   < > 97* 95* 101  CO2 21*   < >  --   --  17*   < > 24  --  26  --  25  --   --   GLUCOSE 151*   < >  --   --  99   < > 140*  --  143*   < > 75 120* 102*  BUN 80*   < >  --   --  158*   < > 44*   < > 48*   < > 28* 28* 38*  CREATININE 6.12*   < >  --   --  8.32*   < > 4.29*   < > 4.61*   < > 3.59* 3.80* 5.90*  CALCIUM 9.3   < >  --   --  10.0   < > 9.1  --  10.2  --  9.5  --   --   MG 2.0  --  2.1 2.4  --   --   --   --   --   --   --   --   --   PHOS  --   --   --   --  2.6  --  3.0  --   --   --   --   --   --    < > =  values in this interval not displayed.   Recent Labs    05/27/19 0348 05/27/19 0348 05/28/19 0253 05/28/19 0253 05/30/19 0727 05/31/19 1319 08/16/19 1818  AST 55*  --  31  --   --   --  54*  ALT 55*  --  42  --   --   --  38  ALKPHOS 272*  --  261*  --   --   --  229*  BILITOT 0.8  --  0.6  --   --   --  1.2  PROT 7.0  --  6.5  --   --   --  7.8  ALBUMIN 2.4*   < > 2.2*   < > 2.3* 2.3* 2.9*   < > = values in this interval not displayed.   Recent Labs    05/31/19 1319 05/31/19 1319 06/06/19 0000 06/06/19 0000 07/27/19 1529 07/27/19 1529 08/16/19 1818 01/02/20 0841 01/03/20 1106  WBC 10.3  --  11.5  --  18.6*  --  6.9  --   --   NEUTROABS  --   --  8  --  15.5*  --  4.4  --   --   HGB 7.6*   < > 9.3*   < > 12.4*   < > 11.0* 11.9* 10.2*  HCT 24.7*   < > 29*   < > 40.2   < > 34.8* 35.0* 30.0*  MCV 88.5  --   --   --  89.3  --  89.5  --   --   PLT 220   < > 251  --  171  --  193  --   --    < > = values in this interval not displayed.   No results for input(s): CHOL, LDLCALC, TRIG in the last 8760 hours.  Invalid input(s): HCL No results  found for: [facility] Lab Results  Component Value Date   TSH 1.839 05/25/2019   Lab Results  Component Value Date   HGBA1C 3.9 01/20/2020   Lab Results  Component Value Date   CHOL 113 11/11/2018   HDL 39 (L) 11/11/2018   LDLCALC 62 11/11/2018   TRIG 60 11/11/2018   CHOLHDL 2.9 11/11/2018    Significant Diagnostic Results in last 30 days:  No results found.  Assessment and Plan  Atrial fibrillation/anticoagulated on Coumadin-today patient's INR is 2.2 which is therapeutic on 3.5 mg Monday Wednesday Friday and 4 mg on every other day; will continue current regimen and repeat in 1 week    Hennie Duos , MD

## 2020-04-29 ENCOUNTER — Encounter: Payer: Self-pay | Admitting: Internal Medicine

## 2020-04-29 ENCOUNTER — Non-Acute Institutional Stay (SKILLED_NURSING_FACILITY): Payer: Medicare Other | Admitting: Internal Medicine

## 2020-04-29 DIAGNOSIS — Z7901 Long term (current) use of anticoagulants: Secondary | ICD-10-CM | POA: Diagnosis not present

## 2020-04-29 DIAGNOSIS — I480 Paroxysmal atrial fibrillation: Secondary | ICD-10-CM

## 2020-04-29 NOTE — Progress Notes (Signed)
Location:  [facility] Room Number: 414-W Place of Service:  SNF [phone])  Edward Duos, MD  Patient Care Team: Edward Duos, MD as PCP - General (Internal Medicine) [name], MD as Consulting Physician (Nephrology) Rehab, [place] ([place], [facility]  Extended Emergency Contact Information Primary Emergency Contact: [name],[name] Address: [place], [place] [address] [name] of [place] Phone: [phone] Work Phone: [phone] Mobile Phone: [phone] Relation: Daughter Secondary Emergency Contact: [place] Phone: [phone] Mobile Phone: [phone] Relation: Other    Allergies: Occlusive silicone sheets [silicone], Other, Sulfa antibiotics, and Tape  Chief Complaint  Patient presents with  . Anticoagulation    Patient is seen for Coumadin management.     HPI: Patient is a 79 y.o. male with atrial fibrillation on Coumadin who is being seen for an INR check.  Today patient's INR is 2.2, which is therapeutic.  Past Medical History:  Diagnosis Date  . A-fib ([place])   . Anemia   . Aortic stenosis   . Blood transfusion   . BPH (benign prostatic hyperplasia)   . CHF (congestive heart failure) ([place])   . Diarrhea   . DM (diabetes mellitus) ([place])    Type II-   not on medication   . ESRD on hemodialysis ([place])    Started dialysis in 2009  . History of GI bleed    secondary to coumadin  . HTN (hypertension)   . Hyperlipidemia   . OSA (obstructive sleep apnea)    uses CPAP  . Sacral wound    stage II - healed, has a protective dressing on- 12/28/2019  . Secondary hyperparathyroidism of renal origin [facility])     Past Surgical History:  Procedure Laterality Date  . ABDOMINAL AORTOGRAM N/A 11/15/2018   Procedure: ABDOMINAL AORTOGRAM;  Surgeon: [name], MD;  Location: [place] CV LAB;  Service: Cardiovascular;  Laterality: N/A;   . AMPUTATION Left 12/06/2018   Procedure: AMPUTATION DIGIT LEFT FIFTH TOE;  Surgeon: Angelia Mould, MD;  Location: [place];  Service: Vascular;  Laterality: Left;  . AMPUTATION Bilateral 12/29/2018   Procedure: AMPUTATION ABOVE KNEE;  Surgeon: Waynetta Sandy, MD;  Location: [place];  Service: Vascular;  Laterality: Bilateral;  . APPLICATION OF WOUND VAC Right 02/17/2019   Procedure: Application Of Wound Vac;  Surgeon: Marty Heck, MD;  Location: [place];  Service: Vascular;  Laterality: Right;  . BVT  01/24/77   Left  Basilic Vein Transposition  . CHOLECYSTECTOMY    . EYE SURGERY     Catarct bil  . I & D EXTREMITY Right 02/17/2019   Procedure: Right above the kneee debridement;  Surgeon: Marty Heck, MD;  Location: [place];  Service: Vascular;  Laterality: Right;  . INSERTION OF DIALYSIS CATHETER  05/28/2012   Procedure: INSERTION OF DIALYSIS CATHETER;  Surgeon: Mal Misty, MD;  Location: [place];  Service: Vascular;  Laterality: Right;  . INSERTION OF DIALYSIS CATHETER Right 01/02/2020   Procedure: INSERTION OF DIALYSIS CATHETER RIGHT INTERNAL JUGULAR;  Surgeon: Elam Dutch, MD;  Location: [place];  Service: Vascular;  Laterality: Right;  . INSERTION OF DIALYSIS CATHETER Right 01/03/2020   Procedure: INSERTION OF DIALYSIS CATHETER RIGHT INTERNAL JUGULAR;  Surgeon: Rosetta Posner, MD;  Location: [place];  Service: Vascular;  Laterality: Right;  . Left arm shuntogram.    . Left  forearm loop graft with 6 mm Gore-Tex graft.    . LOWER EXTREMITY ANGIOGRAPHY Bilateral 11/15/2018   Procedure: LOWER EXTREMITY ANGIOGRAPHY;  Surgeon: [name], MD;  Location: [place] [facility];  Service: Cardiovascular;  Laterality: Bilateral;  . Pars plana vitrectomy with 25-gauge system    . PERIPHERAL VASCULAR ATHERECTOMY Left 11/15/2018   Procedure: PERIPHERAL VASCULAR ATHERECTOMY;  Surgeon: [name], MD;  Location: [place] [facility];  Service: Cardiovascular;  Laterality:  Left;  SFA with STENT  . PERIPHERAL VASCULAR BALLOON ANGIOPLASTY Left 11/15/2018   Procedure: PERIPHERAL VASCULAR BALLOON ANGIOPLASTY;  Surgeon: [name], MD;  Location: [place] [facility];  Service: Cardiovascular;  Laterality: Left;  PT  . REVISON OF ARTERIOVENOUS FISTULA Left 01/02/2020   Procedure: REVISON OF LEFT ARM ARTERIOVENOUS FISTULA;  Surgeon: Elam Dutch, MD;  Location: [place];  Service: Vascular;  Laterality: Left;    Allergies as of 04/29/2020      Reactions   Occlusive Silicone Sheets [silicone] Other (See Comments)   "Allergic," per [facility]   Other Other (See Comments)   Unknown reaction to Occlusive adhesive- "Allergic," per MAR   Sulfa Antibiotics    Tape Itching, Other (See Comments)   Use Cloth tape only, please      Medication List       Accurate as of Apr 29, 2020  8:36 PM. If you have any questions, ask your nurse or doctor.        acetaminophen 500 MG tablet Commonly known as: TYLENOL Take 1,000 mg by mouth 2 (two) times daily.   ADULT NUTRITIONAL SUPPLEMENT PO Take 1 each by mouth daily with supper. Magic Cup with dinner   NEPRO PO Take 1 Can by mouth 2 (two) times daily.   atorvastatin 20 MG tablet Commonly known as: LIPITOR Take 20 mg by mouth daily.   dicyclomine 20 MG tablet Commonly known as: BENTYL Take 20 mg by mouth 3 (three) times daily before meals.   divalproex 125 MG capsule Commonly known as: DEPAKOTE SPRINKLE Take 250 mg by mouth at bedtime.   feeding supplement (PRO-STAT SUGAR FREE 64) Liqd Take 30 mLs by mouth 2 (two) times daily. 9a and 5p   Hectorol 4 MCG/2ML injection Generic drug: doxercalciferol Inject 2 mcg into the vein every Monday, Wednesday, and Friday with hemodialysis.   HYDROcodone-acetaminophen 5-325 MG tablet Commonly known as: NORCO/VICODIN Take 1 tablet by mouth 2 (two) times daily. For Pain   loperamide 2 MG tablet Commonly known as: IMODIUM A-D Take 4 mg by mouth See admin instructions. Take 2  tablets ( 4 mg) by mouth after 1st loose stool as needed for diarrhea   metoprolol tartrate 25 MG tablet Commonly known as: LOPRESSOR Give 1 tablet by mouth at bedtime on Mon Wed ,Fri   metoprolol tartrate 25 MG tablet Commonly known as: LOPRESSOR Give 1 tablet by mouth twice daily on Tues ,Thur ,Sat ,Sun   midodrine 10 MG tablet Commonly known as: PROAMATINE Take one tablet (10 mg) by mouth on Monday, Wednesday, Friday 30 minutes prior to dialysis.   multivitamin Tabs tablet Take 1 tablet by mouth at bedtime.   NON FORMULARY DIET :Regular NAS Diet with Double meat Portion,1200 ml fluid restriction. Diet liberalized to promote PO Intake.   pantoprazole 40 MG tablet Commonly known as: PROTONIX Take 1 tablet (40 mg total) by mouth daily.   sevelamer carbonate 800 MG tablet Commonly known as: RENVELA Take 1,600 mg by mouth 3 (three)  times daily with meals.   Vitamin D3 1.25 MG (50000 UT) Caps Take 50,000 Units by mouth every Friday.   warfarin 3 MG tablet Commonly known as: COUMADIN Take 3 mg tablet with 0.5 mg to = 3.5 mg tues. Thurs, sat, sun   warfarin 1 MG tablet Commonly known as: COUMADIN Take 0.5 mg by mouth. Take 0.5 along with a 3 mg tablet to = 3.5 mg on Tues, Thur, Sat, Sun   warfarin 4 MG tablet Commonly known as: COUMADIN Take 4 mg by mouth every Monday, Wednesday, and Friday.       No orders of the defined types were placed in this encounter.   Immunization History  Administered Date(s) Administered  . Influenza-Unspecified 08/29/2019  . Moderna SARS-COVID-2 Vaccination 12/28/2019, 01/25/2020  . Pneumococcal Polysaccharide-23 10/07/2019    Social History   Tobacco Use  . Smoking status: Former Smoker    Types: Cigarettes    Quit date: 06/30/2004    Years since quitting: 15.8  . Smokeless tobacco: Never Used  Substance Use Topics  . Alcohol use: No     Vitals:   04/29/20 1621  BP: (!) 98/57  Pulse: 70  Resp: 18  Temp: (!) 97.5 F (36.4  C)  SpO2: 95%   Body mass index is 62.28 kg/m.   Patient Active Problem List   Diagnosis Date Noted  . Anticoagulated on Coumadin 12/24/2019  . Infection due to ESBL-producing Escherichia coli 08/12/2019  . MRSA cellulitis 08/12/2019  . Sacral decubitus ulcer, stage IV ([place]) 08/05/2019  . Infected decubitus ulcer, stage IV ([place]) 08/05/2019  . Hypertensive heart and kidney disease with chronic diastolic congestive heart failure and stage 5 chronic kidney disease on chronic dialysis ([place]) 03/24/2019  . End stage renal disease on dialysis due to type 2 diabetes mellitus ([place]) 03/24/2019  . 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 Anemia due to end stage renal disease ([place]) 03/24/2019  . Protein-calorie malnutrition, severe ([place]) 03/24/2019  . Hyperlipidemia associated with type 2 diabetes mellitus ([place]) 02/22/2019  . Amputation stump infection ([place]) 02/15/2019  . Conjunctivitis of left eye 01/04/2019  . 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[place]) 12/30/2018  . Open wound of left foot   . Encounter for monitoring Coumadin therapy   . OSA (obstructive sleep apnea)   . ESRD on hemodialysis ([place])   . BPH (benign prostatic hyperplasia)   . Hypotension 12/07/2018  . GERD (gastroesophageal reflux disease) 12/07/2018  . Pressure injury of skin 12/03/2018  . Symptomatic anemia 12/02/2018  . Acute on chronic blood loss anemia 12/02/2018  . 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[place]) 12/02/2018  . Lower GI bleeding 12/02/2018  . Heme positive stool   . Acute on chronic diastolic (congestive) heart failure ([place]) 11/19/2018  . Acute CVA (cerebrovascular accident) ([place]) 11/19/2018  . Hypoglycemia 11/19/2018  . Peripheral vascular disease due to secondary diabetes ([place]) 11/19/2018  . Dry gangrene ([place]) 11/19/2018  . 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 DNR (do not resuscitate) discussion   . Goals of care, counseling/discussion   . Palliative  care by specialist   . Acute hypoxemic respiratory failure ([place]) 11/06/2018  . Volume overload 11/06/2018  . Multifocal pneumonia 11/06/2018  . Acute metabolic encephalopathy 28/78/6767  . Physical deconditioning 11/06/2018  . Acute respiratory failure with hypoxia and hypercapnia ([place]) 11/04/2018  . CAP (community acquired pneumonia) 11/04/2018  . Pleural effusion, right 11/04/2018  . Increased ammonia level 11/04/2018  . Abnormal  CT of the abdomen 11/04/2018  . Atrial fibrillation with RVR ([place]) 11/04/2018  . Sepsis ([place]) 10/21/2018  . Acute encephalopathy 10/21/2018  . Elevated alkaline phosphatase level 10/21/2018  . Chronic anemia 10/21/2018  . Thrombocytopenia ([place]) 10/21/2018  . Pulmonary HTN ([place]) 03/04/2017  . Aortic valve stenosis 03/04/2017  . Hypertension, accelerated with heart disease, without CHF 01/12/2017  . Dyspnea on exertion 01/12/2017  . ESRD (end stage renal disease) ([place]) 06/07/2012  . Hyperlipidemia 04/24/2011  . HYPERTENSION, BENIGN 10/24/2009  . Type 2 diabetes mellitus with hypertension and end stage renal disease on dialysis ([place]) 10/23/2009  . OBSTRUCTIVE SLEEP APNEA 10/23/2009  . PAF (paroxysmal atrial fibrillation) ([place]) 10/23/2009  . CHF (congestive heart failure) ([place]) 10/23/2009    CMP     Component Value Date/Time   NA 138 01/03/2020 1106   NA 143 06/06/2019 0000   K 3.7 01/03/2020 1106   CL 101 01/03/2020 1106   CO2 25 08/16/2019 1818   GLUCOSE 102 (H) 01/03/2020 1106   BUN 38 (H) 01/03/2020 1106   BUN 44 (A) 06/06/2019 0000   CREATININE 5.90 (H) 01/03/2020 1106   CALCIUM 9.5 08/16/2019 1818   CALCIUM 8.3 (L) 10/25/2008 1450   PROT 7.8 08/16/2019 1818   ALBUMIN 2.9 (L) 08/16/2019 1818   AST 54 (H) 08/16/2019 1818   ALT 38 08/16/2019 1818   ALKPHOS 229 (H) 08/16/2019 1818   BILITOT 1.2 08/16/2019 1818   GFRNONAA 15 (L) 08/16/2019 1818   GFRAA 18 (L) 08/16/2019 1818   Recent Labs    05/28/19 0253 05/28/19 0253 05/29/19 0541  05/29/19 2316 05/30/19 0727 05/30/19 0727 05/31/19 1319 06/06/19 0000 07/27/19 1529 07/27/19 1529 08/16/19 1818 01/02/20 0841 01/03/20 1106  NA 137   < >  --   --  137   < > 136   < > 138   < > 136 140 138  K 3.7   < >  --   --  5.1   < > 3.6   < > 3.5   < > 4.0 3.6 3.7  CL 102   < >  --   --  101   < > 98  --  95*   < > 97* 95* 101  CO2 21*   < >  --   --  17*   < > 24  --  26  --  25  --   --   GLUCOSE 151*   < >  --   --  99   < > 140*  --  143*   < > 75 120* 102*  BUN 80*   < >  --   --  158*   < > 44*   < > 48*   < > 28* 28* 38*  CREATININE 6.12*   < >  --   --  8.32*   < > 4.29*   < > 4.61*   < > 3.59* 3.80* 5.90*  CALCIUM 9.3   < >  --   --  10.0   < > 9.1  --  10.2  --  9.5  --   --   MG 2.0  --  2.1 2.4  --   --   --   --   --   --   --   --   --   PHOS  --   --   --   --  2.6  --  3.0  --   --   --   --   --   --    < > =  values in this interval not displayed.   Recent Labs    05/27/19 0348 05/27/19 0348 05/28/19 0253 05/28/19 0253 05/30/19 0727 05/31/19 1319 08/16/19 1818  AST 55*  --  31  --   --   --  54*  ALT 55*  --  42  --   --   --  38  ALKPHOS 272*  --  261*  --   --   --  229*  BILITOT 0.8  --  0.6  --   --   --  1.2  PROT 7.0  --  6.5  --   --   --  7.8  ALBUMIN 2.4*   < > 2.2*   < > 2.3* 2.3* 2.9*   < > = values in this interval not displayed.   Recent Labs    05/31/19 1319 05/31/19 1319 06/06/19 0000 06/06/19 0000 07/27/19 1529 07/27/19 1529 08/16/19 1818 01/02/20 0841 01/03/20 1106  WBC 10.3  --  11.5  --  18.6*  --  6.9  --   --   NEUTROABS  --   --  8  --  15.5*  --  4.4  --   --   HGB 7.6*   < > 9.3*   < > 12.4*   < > 11.0* 11.9* 10.2*  HCT 24.7*   < > 29*   < > 40.2   < > 34.8* 35.0* 30.0*  MCV 88.5  --   --   --  89.3  --  89.5  --   --   PLT 220   < > 251  --  171  --  193  --   --    < > = values in this interval not displayed.   No results for input(s): CHOL, LDLCALC, TRIG in the last 8760 hours.  Invalid input(s): HCL No  results found for: [facility] Lab Results  Component Value Date   TSH 1.839 05/25/2019   Lab Results  Component Value Date   HGBA1C 3.9 01/20/2020   Lab Results  Component Value Date   CHOL 113 11/11/2018   HDL 39 (L) 11/11/2018   LDLCALC 62 11/11/2018   TRIG 60 11/11/2018   CHOLHDL 2.9 11/11/2018    Significant Diagnostic Results in last 30 days:  No results found.  Assessment and Plan  Atrial fibrillation/anticoagulated on Coumadin-today's patient's INR is 2.2 which is therapeutic.  He is on 4 mg of Coumadin Monday Wednesday Friday and 3.5 mg of Coumadin every other day.  We will continue current regimen and repeat INR in 1 week     [name] , MD

## 2020-05-01 ENCOUNTER — Encounter: Payer: Self-pay | Admitting: Internal Medicine

## 2020-05-23 ENCOUNTER — Non-Acute Institutional Stay (SKILLED_NURSING_FACILITY): Payer: Medicare Other | Admitting: Internal Medicine

## 2020-05-23 DIAGNOSIS — Z7901 Long term (current) use of anticoagulants: Secondary | ICD-10-CM | POA: Diagnosis not present

## 2020-05-23 DIAGNOSIS — I4891 Unspecified atrial fibrillation: Secondary | ICD-10-CM | POA: Diagnosis not present

## 2020-05-25 ENCOUNTER — Encounter: Payer: Self-pay | Admitting: Internal Medicine

## 2020-05-25 NOTE — Progress Notes (Signed)
This is an acute visit.  Level care scale.  [facility]  Chief complaint acute visit secondary to anticoagulation management with history of atrial fibrillation.  History of present illness  Patient is a pleasant 79 year old male who is on chronic Coumadin with a history of atrial fibrillation.  INR today is therapeutic at 2.1-however this is down slightly from what it was earlier in the week on May 24 at 2.5.  He is currently on 4 mg of Coumadin Monday Wednesday and Friday and 3.5 mg all other days.  There has been no increased bruising or bleeding-he appears to be doing well today.  Atrial fibrillation appears to be rate controlled on Lopressor.  Systolics run on the low side this is not unusual at times systolics are below 892 but this is well-tolerated usually stays in the 90s when this occurs -he does have orders for Midodrine on dialysis days    Past Medical History:  Diagnosis Date   A-fib ([place])    Anemia    Aortic stenosis    Blood transfusion    BPH (benign prostatic hyperplasia)    CHF (congestive heart failure) ([place])    Diarrhea    DM (diabetes mellitus) ([place])    Type II-   not on medication    ESRD on hemodialysis ([place])    Started dialysis in 2009   History of GI bleed    secondary to coumadin   HTN (hypertension)    Hyperlipidemia    OSA (obstructive sleep apnea)    uses CPAP   Sacral wound    stage II - healed, has a protective dressing on- 12/28/2019   Secondary hyperparathyroidism of renal origin [facility])         Past Surgical History:  Procedure Laterality Date   ABDOMINAL AORTOGRAM N/A 11/15/2018   Procedure: ABDOMINAL AORTOGRAM;  Surgeon: [name], MD;  Location: [place] [facility];  Service: Cardiovascular;  Laterality: N/A;   AMPUTATION Left 12/06/2018   Procedure: AMPUTATION DIGIT LEFT FIFTH TOE;  Surgeon: Angelia Mould, MD;  Location: [place];  Service: Vascular;   Laterality: Left;   AMPUTATION Bilateral 12/29/2018   Procedure: AMPUTATION ABOVE KNEE;  Surgeon: Waynetta Sandy, MD;  Location: [place];  Service: Vascular;  Laterality: Bilateral;   APPLICATION OF WOUND VAC Right 02/17/2019   Procedure: Application Of Wound Vac;  Surgeon: Marty Heck, MD;  Location: [place];  Service: Vascular;  Laterality: Right;   BVT  01/16/40   Left  Basilic Vein Transposition   CHOLECYSTECTOMY     EYE SURGERY     Catarct bil   I & D EXTREMITY Right 02/17/2019   Procedure: Right above the kneee debridement;  Surgeon: Marty Heck, MD;  Location: [place];  Service: Vascular;  Laterality: Right;   INSERTION OF DIALYSIS CATHETER  05/28/2012   Procedure: INSERTION OF DIALYSIS CATHETER;  Surgeon: Mal Misty, MD;  Location: [place];  Service: Vascular;  Laterality: Right;   INSERTION OF DIALYSIS CATHETER Right 01/02/2020   Procedure: INSERTION OF DIALYSIS CATHETER RIGHT INTERNAL JUGULAR;  Surgeon: Elam Dutch, MD;  Location: [place];  Service: Vascular;  Laterality: Right;   INSERTION OF DIALYSIS CATHETER Right 01/03/2020   Procedure: INSERTION OF DIALYSIS CATHETER RIGHT INTERNAL JUGULAR;  Surgeon: Rosetta Posner, MD;  [facility];  Service: Vascular;  Laterality: Right;   Left arm shuntogram.     Left forearm loop graft with 6 mm Gore-Tex graft.  LOWER EXTREMITY ANGIOGRAPHY Bilateral 11/15/2018   Procedure: LOWER EXTREMITY ANGIOGRAPHY;  Surgeon: [name], MD;  Location: [place] [facility];  Service: Cardiovascular;  Laterality: Bilateral;   Pars plana vitrectomy with 25-gauge system     PERIPHERAL VASCULAR ATHERECTOMY Left 11/15/2018   Procedure: PERIPHERAL VASCULAR ATHERECTOMY;  Surgeon: [name], MD;  Location: [place] [facility];  Service: Cardiovascular;  Laterality: Left;  SFA with STENT   PERIPHERAL VASCULAR BALLOON ANGIOPLASTY Left 11/15/2018   Procedure: PERIPHERAL VASCULAR BALLOON  ANGIOPLASTY;  Surgeon: [name], MD;  Location: [place] [facility];  Service: Cardiovascular;  Laterality: Left;  PT   REVISON OF ARTERIOVENOUS FISTULA Left 01/02/2020   Procedure: REVISON OF LEFT ARM ARTERIOVENOUS FISTULA;  Surgeon: Elam Dutch, MD;  Location: [place];  Service: Vascular;  Laterality: Left;         Allergies  Allergen Reactions   Occlusive Silicone Sheets [Silicone] Other (See Comments)    "Allergic," per [facility]   Other Other (See Comments)    Unknown reaction to Occlusive adhesive- "Allergic," per MAR   Sulfa Antibiotics    Tape Itching and Other (See Comments)    Use Cloth tape only, please         Allergies as of 04/25/2020      Reactions   Occlusive Silicone Sheets [silicone] Other (See Comments)   "Allergic," per [facility]   Other Other (See Comments)   Unknown reaction to Occlusive adhesive- "Allergic," per MAR   Sulfa Antibiotics    Tape Itching, Other (See Comments)   Use Cloth tape only, please         Medication List             acetaminophen 500 MG tablet Commonly known as: TYLENOL Take 1,000 mg by mouth 2 (two) times daily.   ADULT NUTRITIONAL SUPPLEMENT PO Take 1 each by mouth daily with supper. Magic Cup with dinner   NEPRO PO Take 1 Can by mouth 2 (two) times daily.   atorvastatin 20 MG tablet Commonly known as: LIPITOR Take 20 mg by mouth daily.   dicyclomine 20 MG tablet Commonly known as: BENTYL Take 20 mg by mouth 3 (three) times daily before meals.   divalproex 125 MG capsule Commonly known as: DEPAKOTE SPRINKLE Take 250 mg by mouth at bedtime. What changed: Another medication with the same name was removed. Continue taking this medication, and follow the directions you see here. Changed by: Granville Lewis, PA-C   feeding supplement (PRO-STAT SUGAR FREE 64) Liqd Take 30 mLs by mouth 2 (two) times daily. 9a and 5p   Hectorol 4 MCG/2ML injection Generic drug:  doxercalciferol Inject 2 mcg into the vein every Monday, Wednesday, and Friday with hemodialysis.   HYDROcodone-acetaminophen 5-325 MG tablet Commonly known as: NORCO/VICODIN Take 1 tablet by mouth 2 (two) times daily. For Pain   loperamide 2 MG tablet Commonly known as: IMODIUM A-D Take 4 mg by mouth See admin instructions. Take 2 tablets ( 4 mg) by mouth after 1st loose stool as needed for diarrhea   metoprolol tartrate 25 MG tablet Commonly known as: LOPRESSOR Give 1 tablet by mouth at bedtime on Mon Wed ,Fri   metoprolol tartrate 25 MG tablet Commonly known as: LOPRESSOR Give 1 tablet by mouth twice daily on Tues ,Thur ,Sat ,Sun   midodrine 10 MG tablet Commonly known as: PROAMATINE Take one tablet (10 mg) by mouth on Monday, Wednesday, Friday 30 minutes prior to dialysis.   multivitamin  Tabs tablet Take 1 tablet by mouth at bedtime.   NON FORMULARY DIET :Regular NAS Diet with Double meat Portion,1200 ml fluid restriction. Diet liberalized to promote PO Intake.   pantoprazole 40 MG tablet Commonly known as: PROTONIX Take 1 tablet (40 mg total) by mouth daily.   sevelamer carbonate 800 MG tablet Commonly known as: RENVELA Take 1,600 mg by mouth 3 (three) times daily with meals.   Vitamin D3 1.25 MG (50000 UT) Caps Take 50,000 Units by mouth every Friday.   warfarin 3 MG tablet Commonly known as: COUMADIN Take 3 mg tablet with 0.5 mg to = 3.5 mg tues. Thurs, sat, sun   warfarin 1 MG tablet Commonly known as: COUMADIN Take 0.5 mg by mouth. Take 0.5 along with a 3 mg tablet to = 3.5 mg on Tues, Thur, Sat, Sun   warfarin 4 MG tablet Commonly known as: COUMADIN Take 4 mg by mouth every Monday, Wednesday, and Friday.      Review of systems.  This is limited secondary to dementia.  In general is not complaining of any fever or chills.  Skin no complaints of increased bruising or bleeding.  Head ears eyes nose mouth and throat no complaints of  sore throat or visual changes.  Respiratory does not complain of having a cough or being short of breath.  Cardiac is not complaining of any chest pain.  GI does not complain of abdominal discomfort no nausea vomiting diarrhea constipation has been noted.  GU positive for end-stage renal disease on dialysis does not complain of dysuria.  Musculoskeletal is bilateral above-the-knee amputations does not complain of pain at this point.  Neurologic does not complain of dizziness or syncope or increased weakness.  And psych is positive for dementia he does not complain of being depressed or anxious    Physical exam.  Temperature 97.1 pulse 70 respirations 16   In general this is a pleasant elderly male in no distress sitting comfortably in his wheelchair.  His skin is warm and dry.  Eyes visual acuity appears to be intact.  Oropharynx is clear mucous membranes moist.  Chest is clear to auscultation with somewhat shallow air entry there is no labored breathing.  Heart is regular irregular rate and rhythm with baseline systolic murmur.  Abdomen is soft nontender with positive bowel sounds.  Musculoskeletal he does have bilateral AKA's moves upper extremities at baseline.    Labs.  May 23, 2020.  INR 2.1.  May 20, 2020.  INR 2.5  Assessment and plan.  1.  Anticoagulation management with history of atrial fibrillation at this point rate appears to be controlled on Lopressor-INR is therapeutic but has gone down somewhat will adjust dose slightly and give 4 mg tonight and Friday and then 3.5 mg Saturday and Sunday and have this rechecked on Monday, March 31.  Clinically he appears to be stable.  [id number]

## 2020-06-06 ENCOUNTER — Encounter: Payer: Self-pay | Admitting: Internal Medicine

## 2020-06-06 ENCOUNTER — Non-Acute Institutional Stay (SKILLED_NURSING_FACILITY): Payer: Medicare Other | Admitting: Internal Medicine

## 2020-06-06 DIAGNOSIS — E1122 Type 2 diabetes mellitus with diabetic chronic kidney disease: Secondary | ICD-10-CM | POA: Diagnosis not present

## 2020-06-06 DIAGNOSIS — I480 Paroxysmal atrial fibrillation: Secondary | ICD-10-CM

## 2020-06-06 DIAGNOSIS — Z66 Do not resuscitate: Secondary | ICD-10-CM

## 2020-06-06 DIAGNOSIS — K3184 Gastroparesis: Secondary | ICD-10-CM

## 2020-06-06 DIAGNOSIS — N186 End stage renal disease: Secondary | ICD-10-CM

## 2020-06-06 DIAGNOSIS — Z89611 Acquired absence of right leg above knee: Secondary | ICD-10-CM

## 2020-06-06 DIAGNOSIS — Z7901 Long term (current) use of anticoagulants: Secondary | ICD-10-CM

## 2020-06-06 DIAGNOSIS — Z992 Dependence on renal dialysis: Secondary | ICD-10-CM

## 2020-06-06 DIAGNOSIS — Z89612 Acquired absence of left leg above knee: Secondary | ICD-10-CM

## 2020-06-06 NOTE — Progress Notes (Signed)
Location:  [place] Room Number: 414-W Place of Service:  SNF [phone]) Provider: Granville Lewis, P.A.  PCP: Gayland Curry, DO  Patient Care Team: Gayland Curry, DO as PCP - General (Geriatric Medicine) [name], MD as Consulting Physician (Nephrology) Rehab, [facility], [facility]  Extended Emergency Contact Information Primary Emergency Contact: [name],[name] Address: [place], [place] [address] [name] of [place] Phone: [phone] Work Phone: [phone] Mobile Phone: [phone] Relation: Daughter Secondary Emergency Contact: [place] Phone: [phone] Mobile Phone: [phone] Relation: Other  Code Status:  DNR  Goals of care: Advanced Directive information Advanced Directives 04/29/2020  Does Patient Have a Medical Advance Directive? Yes  Type of Advance Directive Out of facility DNR (pink MOST or yellow form)  Does patient want to make changes to medical advance directive? No - Patient declined  Copy of [place] in Chart? -  Would patient like information on creating a medical advance directive? -  Pre-existing out of facility DNR order (yellow form or pink MOST form) Yellow form placed in chart (order not valid for inpatient use);Pink MOST form placed in chart (order not valid for inpatient use)     Chief Complaint  Patient presents with  . Anticoagulation    Antiocoagulation management   On chronic Coumadin with a history of atrial fibrillation  HPI:  Pt is a 79 y.o. male seen today for an acute visit for anticoagulation management.  Patient has a history of atrial fibrillation is on chronic Coumadin for anticoagulation.  His INR has been relatively stable recently occasionally we will make minor changes secondary to a slightly subtherapeutic level however INR is therapeutic today at 2.7-on June 7 it was  2.4.  A week ago on June 3 it was slightly subtherapeutic at 1.9 and we did increase his Coumadin dose very slightly.  Currently he is on 4 mg Monday Wednesday Fridays and 3.5 mg all other days.  He does not show any evidence of bruising or bleeding he does have a history of dementia and is a poor historian but nursing does not report any issues.  He is on Lopressor for rate control.  In regards to end-stage renal dialysis he appears to be tolerating dialysis pretty well here he is resting in bed comfortably-he is a bit agitated with exam but this is not really new.  He also continues on Depakote nightly for history of dementia with behaviors nursing does not report really any acute issues.     Past Medical History:  Diagnosis Date  . A-fib ([place])   . Anemia   . Aortic stenosis   . Blood transfusion   . BPH (benign prostatic hyperplasia)   . CHF (congestive heart failure) ([place])   . Diarrhea   . 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 ESRD on hemodialysis ([place])    Started dialysis in 2009  . History of GI bleed    secondary to coumadin  . HTN (hypertension)   . Hyperlipidemia   . OSA (obstructive sleep apnea)    uses CPAP  . Sacral wound    stage II - healed, has a protective dressing on- 12/28/2019  . Secondary hyperparathyroidism of renal origin [facility])    Past Surgical History:  Procedure Laterality Date  . ABDOMINAL AORTOGRAM N/A 11/15/2018  Procedure: ABDOMINAL AORTOGRAM;  Surgeon: [name], MD;  Location: [place] CV LAB;  Service: Cardiovascular;  Laterality: N/A;  . AMPUTATION Left 12/06/2018   Procedure: AMPUTATION DIGIT LEFT FIFTH TOE;  Surgeon: Angelia Mould, MD;  Location: [place];  Service: Vascular;  Laterality: Left;  . AMPUTATION Bilateral 12/29/2018   Procedure: AMPUTATION ABOVE KNEE;  Surgeon: Waynetta Sandy, MD;  Location: [place];  Service: Vascular;  Laterality: Bilateral;  . APPLICATION OF WOUND VAC  Right 02/17/2019   Procedure: Application Of Wound Vac;  Surgeon: Marty Heck, MD;  Location: [place];  Service: Vascular;  Laterality: Right;  . BVT  3/82/50   Left  Basilic Vein Transposition  . CHOLECYSTECTOMY    . EYE SURGERY     Catarct bil  . I & D EXTREMITY Right 02/17/2019   Procedure: Right above the kneee debridement;  Surgeon: Marty Heck, MD;  Location: [place];  Service: Vascular;  Laterality: Right;  . INSERTION OF DIALYSIS CATHETER  05/28/2012   Procedure: INSERTION OF DIALYSIS CATHETER;  Surgeon: Mal Misty, MD;  Location: [place];  Service: Vascular;  Laterality: Right;  . INSERTION OF DIALYSIS CATHETER Right 01/02/2020   Procedure: INSERTION OF DIALYSIS CATHETER RIGHT INTERNAL JUGULAR;  Surgeon: Elam Dutch, MD;  Location: [place];  Service: Vascular;  Laterality: Right;  . INSERTION OF DIALYSIS CATHETER Right 01/03/2020   Procedure: INSERTION OF DIALYSIS CATHETER RIGHT INTERNAL JUGULAR;  Surgeon: Rosetta Posner, MD;  Location: [place];  Service: Vascular;  Laterality: Right;  . Left arm shuntogram.    . Left forearm loop graft with 6 mm Gore-Tex graft.    . LOWER EXTREMITY ANGIOGRAPHY Bilateral 11/15/2018   Procedure: LOWER EXTREMITY ANGIOGRAPHY;  Surgeon: [name], MD;  Location: [place] CV LAB;  Service: Cardiovascular;  Laterality: Bilateral;  . Pars plana vitrectomy with 25-gauge system    . PERIPHERAL VASCULAR ATHERECTOMY Left 11/15/2018   Procedure: PERIPHERAL VASCULAR ATHERECTOMY;  Surgeon: [name], MD;  Location: [place] CV LAB;  Service: Cardiovascular;  Laterality: Left;  SFA with STENT  . PERIPHERAL VASCULAR BALLOON ANGIOPLASTY Left 11/15/2018   Procedure: PERIPHERAL VASCULAR BALLOON ANGIOPLASTY;  Surgeon: [name], MD;  Location: [place] CV LAB;  Service: Cardiovascular;  Laterality: Left;  PT  . REVISON OF ARTERIOVENOUS FISTULA Left 01/02/2020   Procedure: REVISON OF LEFT ARM ARTERIOVENOUS FISTULA;  Surgeon: Elam Dutch, MD;  Location: [place];  Service: Vascular;  Laterality: Left;    Allergies  Allergen Reactions  . Occlusive Silicone Sheets [Silicone] Other (See Comments)    "Allergic," per MAR  . Other Other (See Comments)    Unknown reaction to Occlusive adhesive- "Allergic," per MAR  . Sulfa Antibiotics   . Tape Itching and Other (See Comments)    Use Cloth tape only, please    Outpatient Encounter Medications as of 06/06/2020  Medication Sig  . acetaminophen (TYLENOL) 500 MG tablet Take 1,000 mg by mouth 2 (two) times daily.   . Amino Acids-Protein Hydrolys (FEEDING SUPPLEMENT, PRO-STAT SUGAR FREE 64,) LIQD Take 30 mLs by mouth 2 (two) times daily. 9a and 5p  . atorvastatin (LIPITOR) 20 MG tablet Take 20 mg by mouth daily.  . Cholecalciferol (VITAMIN D3) 1.25 MG (50000 UT) CAPS Take 50,000 Units by mouth every Friday.   . dicyclomine (BENTYL) 20 MG tablet Take 20 mg by mouth 3 (three) times daily before meals.  . divalproex (DEPAKOTE SPRINKLE) 125  MG capsule Take 250 mg by mouth at bedtime.  Marland Kitchen doxercalciferol (HECTOROL) 4 MCG/2ML injection Inject 2 mcg into the vein every Monday, Wednesday, and Friday with hemodialysis.  Marland Kitchen HYDROcodone-acetaminophen (NORCO/VICODIN) 5-325 MG tablet Take 1 tablet by mouth 2 (two) times daily. For Pain  . loperamide (IMODIUM A-D) 2 MG tablet Take 4 mg by mouth See admin instructions. Take 2 tablets ( 4 mg) by mouth after 1st loose stool as needed for diarrhea  . metoprolol tartrate (LOPRESSOR) 25 MG tablet Give 1 tablet by mouth at bedtime on Mon Wed ,Fri  . metoprolol tartrate (LOPRESSOR) 25 MG tablet Give 1 tablet by mouth twice daily on Tues ,Thur ,Sat ,Sun  . midodrine (PROAMATINE) 10 MG tablet Take one tablet (10 mg) by mouth on Monday, Wednesday, Friday 30 minutes prior to dialysis.  Marland Kitchen multivitamin (RENA-VIT) TABS tablet Take 1 tablet by mouth at bedtime.  . NON FORMULARY DIET :Regular NAS Diet with Double meat Portion,1200 ml fluid restriction. Diet  liberalized to promote PO Intake.  . Nutritional Supplements (ADULT NUTRITIONAL SUPPLEMENT PO) Take 1 each by mouth daily with supper. Magic Cup with dinner  . Nutritional Supplements (NEPRO PO) Take 1 Can by mouth 2 (two) times daily.   . pantoprazole (PROTONIX) 40 MG tablet Take 1 tablet (40 mg total) by mouth daily.  . sevelamer carbonate (RENVELA) 800 MG tablet Take 1,600 mg by mouth 3 (three) times daily with meals.  . warfarin (COUMADIN) 1 MG tablet Take 0.5 mg by mouth. Take 0.5 along with a 3 mg tablet to = 3.5 mg on Tues, Thur, Sat, Sun  . warfarin (COUMADIN) 3 MG tablet Take 3 mg tablet with 0.5 mg to = 3.5 mg tues. Thurs, sat, sun  . warfarin (COUMADIN) 4 MG tablet Take 4 mg by mouth every Monday, Wednesday, and Friday.   No facility-administered encounter medications on file as of 06/06/2020.    Review of Systems   This is limited secondary to dementia-she is also bit agitated today which occasionally happens.  But is not really complaining of any pain or shortness of breath or discomfort-I do not note any increased bruising or bleeding  Immunization History  Administered Date(s) Administered  . Influenza-Unspecified 08/29/2019  . Moderna SARS-COVID-2 Vaccination 12/28/2019, 01/25/2020  . Pneumococcal Polysaccharide-23 10/07/2019   Pertinent  Health Maintenance Due  Topic Date Due  . FOOT EXAM  Never done  . HEMOGLOBIN A1C  07/19/2020  . INFLUENZA VACCINE  07/28/2020  . PNA vac Low Risk Adult (2 of 2 - PCV13) 10/06/2020  . OPHTHALMOLOGY EXAM  12/18/2020  . URINE MICROALBUMIN  Discontinued   No flowsheet data found. Functional Status Survey:   Temperature is 97.0 pulse 69 respirations 18 blood pressure 39/76-BHALPFX range of systolics range from the 90W to 130s-.  In general this is a somewhat frail elderly male who appears to be at his baseline he is a little agitated with exam today.  His skin is warm and dry do not note any increased bruising or bleeding-chest is  clear to auscultation there is no labored breathing air entry is somewhat shallow.  Eyes visual acuity remains intact.  Oropharynx is clear mucous membranes moist.   Heart is baseline regular irregular rate and rhythm pulse is in the 60s on auscultation-he does have a systolic murmur which sounds baseline.     Abdomen is soft nontender with positive bowel sounds.  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  Neurologic appears grossly intact cannot appreciate lateralizing findings.  Psych he is oriented to self he is a bit agitated with exam-says he just basically wants to be left alone-at times he will express this thought    Labs reviewed: Recent Labs    07/27/19 1529 07/27/19 1529 08/16/19 1818 01/02/20 0841 01/03/20 1106  NA 138   < > 136 140 138  K 3.5   < > 4.0 3.6 3.7  CL 95*   < > 97* 95* 101  CO2 26  --  25  --   --   GLUCOSE 143*   < > 75 120* 102*  BUN 48*   < > 28* 28* 38*  CREATININE 4.61*   < > 3.59* 3.80* 5.90*  CALCIUM 10.2  --  9.5  --   --    < > = values in this interval not displayed.   Recent Labs    08/16/19 1818  AST 54*  ALT 38  ALKPHOS 229*  BILITOT 1.2  PROT 7.8  ALBUMIN 2.9*   Recent Labs    07/27/19 1529 07/27/19 1529 08/16/19 1818 01/02/20 0841 01/03/20 1106  WBC 18.6*  --  6.9  --   --   NEUTROABS 15.5*  --  4.4  --   --   HGB 12.4*   < > 11.0* 11.9* 10.2*  HCT 40.2   < > 34.8* 35.0* 30.0*  MCV 89.3  --  89.5  --   --   PLT 171  --  193  --   --    < > = values in this interval not displayed.   Lab Results  Component Value Date   TSH 1.839 05/25/2019   Lab Results  Component Value Date   HGBA1C 3.9 01/20/2020   Lab Results  Component Value Date   CHOL 113 11/11/2018   HDL 39 (L) 11/11/2018   LDLCALC 62 11/11/2018   TRIG 60 11/11/2018   CHOLHDL 2.9 11/11/2018    Significant Diagnostic Results in last 30 days:  No results  found.  Assessment/Plan  #1 anticoagulation management with history of A. fib-on chronic Coumadin-we will continue current Coumadin dose of 3.5 mg Tuesday Thursday Saturday and Sunday and 4 mg Monday Wednesday Friday-this will need to be updated on Monday, June 14-.  INR is therapeutic today at 2.7 he has been on the same dose it appears for the past week.  2.  History of atrial fibrillation this appears rate controlled he is on Lopressor he receives 25 mg twice daily on nondialysis days and only at bedtime on dialysis days.  At times his systolics are in the 95J but this appears to be well-tolerated again baseline systolics appear to be in the 90s to 130s per recent chart review.  3.  History of end-stage renal disease on chronic dialysis he appears to be tolerating this fairly well-would like to get updated labs for our review including a CBC metabolic panel and hemoglobin A1c.  Will await dialysis input if not may draw here at the facility.  4.  History of above-the-knee amputation-he appears to be doing well with supportive care he does ambulate in a wheelchair nursing does not report problems-.   4 History of gastric motility issues -he continues on Bentyl 20 mg 3 times daily with meals-this appears to be stable has not been an issue for some time to my knowledge-  [phone]

## 2020-06-07 ENCOUNTER — Encounter: Payer: Self-pay | Admitting: Internal Medicine

## 2020-06-24 ENCOUNTER — Other Ambulatory Visit: Payer: Self-pay | Admitting: Internal Medicine

## 2020-06-24 MED ORDER — HYDROCODONE-ACETAMINOPHEN 5-325 MG PO TABS: 1.0000 | ORAL_TABLET | Freq: Two times a day (BID) | ORAL | 0 refills | Status: DC

## 2020-06-26 ENCOUNTER — Non-Acute Institutional Stay (SKILLED_NURSING_FACILITY): Payer: Medicare Other | Admitting: Family

## 2020-06-26 ENCOUNTER — Encounter: Payer: Self-pay | Admitting: Family

## 2020-06-26 DIAGNOSIS — I480 Paroxysmal atrial fibrillation: Secondary | ICD-10-CM | POA: Diagnosis not present

## 2020-06-26 DIAGNOSIS — Z992 Dependence on renal dialysis: Secondary | ICD-10-CM

## 2020-06-26 DIAGNOSIS — E1169 Type 2 diabetes mellitus with other specified complication: Secondary | ICD-10-CM | POA: Diagnosis not present

## 2020-06-26 DIAGNOSIS — E1122 Type 2 diabetes mellitus with diabetic chronic kidney disease: Secondary | ICD-10-CM | POA: Diagnosis not present

## 2020-06-26 DIAGNOSIS — Z89611 Acquired absence of right leg above knee: Secondary | ICD-10-CM

## 2020-06-26 DIAGNOSIS — N186 End stage renal disease: Secondary | ICD-10-CM

## 2020-06-26 DIAGNOSIS — Z89612 Acquired absence of left leg above knee: Secondary | ICD-10-CM

## 2020-06-26 DIAGNOSIS — E785 Hyperlipidemia, unspecified: Secondary | ICD-10-CM

## 2020-06-26 DIAGNOSIS — I5032 Chronic diastolic (congestive) heart failure: Secondary | ICD-10-CM | POA: Diagnosis not present

## 2020-06-26 DIAGNOSIS — I132 Hypertensive heart and chronic kidney disease with heart failure and with stage 5 chronic kidney disease, or end stage renal disease: Secondary | ICD-10-CM

## 2020-06-26 NOTE — Progress Notes (Signed)
Location:    [place].   Nursing Home Room Number: 414-D Place of Service:  SNF (31) Provider:  Marlowe Sax, NP   Patient Care Team: Gayland Curry, DO as PCP - General (Geriatric Medicine) [name], MD as Consulting Physician (Nephrology) Rehab, [facility]  Extended Emergency Contact Information Primary Emergency Contact: [name],[name] Address: [place], [place] [address] [name] of [place] Phone: [phone] Work Phone: [phone] Mobile Phone: [phone] Relation: Daughter Secondary Emergency Contact: [place] Phone: [phone] Mobile Phone: [phone] Relation: Other  Code Status:  DNR Goals of care: Advanced Directive information Advanced Directives 06/26/2020  Does Patient Have a Medical Advance Directive? Yes  Type of Paramedic of [place];Out of facility DNR (pink MOST or yellow form)  Does patient want to make changes to medical advance directive? No - Patient declined  Copy of [place] in Chart? Yes - validated most recent copy scanned in chart (See row information)  Would patient like information on creating a medical advance directive? -  Pre-existing out of facility DNR order (yellow form or pink MOST form) Yellow form placed in chart (order not valid for inpatient use);Pink MOST form placed in chart (order not valid for inpatient use)     Chief Complaint  Patient presents with  . Medical Management of Chronic Issues    Routine Visit.  Marland Kitchen Health Maintenance    Discuss the need for Hepatitis C Screening, and Foot Exam.    HPI:  Pt is a 79 y.o. male seen today for medical management of chronic diseases.He is seen up in the bed watching TV.He denies any acute issues.States just tired from dialysis this morning.  Afib - on coumadin 4 mg tablet on Mon-Wed-Fri alternating with 3.5  mg tablet on sat,Sun,Tue and Thur.latest therapeutic INR 2.8 ( 06/13/2020).   CHF - No abrupt weight gain.Has no cough,shortness of breath or wheezing.   HTN - B/p readings in the 90/60's - 130's/70's.On metoprolol tartrate 25 mg tablet twice daily on non-dialysis days and once a day on dialysis days.Pulse in the 60 's   Type 2 DM -  Not on any medication.latest Hgb A1C 3.9 (01/20/2020). Reports no symptoms of hypoglycemia.   Hyperlipidemia - on atorvastatin 20 mg tablet daily.No recent LDL for review previous LDL 62    Past Medical History:  Diagnosis Date  . A-fib ([place])   . Anemia   . Aortic stenosis   . Blood transfusion   . BPH (benign prostatic hyperplasia)   . CHF (congestive heart failure) ([place])   . Diarrhea   . DM (diabetes mellitus) ([place])    Type II-   not on medication   . ESRD on hemodialysis ([place])    Started dialysis in 2009  . History of GI bleed    secondary to coumadin  . HTN (hypertension)   . Hyperlipidemia   . OSA (obstructive sleep apnea)    uses CPAP  . Sacral wound    stage II - healed, has a protective dressing on- 12/28/2019  . Secondary hyperparathyroidism of renal origin [facility])    Past Surgical History:  Procedure Laterality Date  . ABDOMINAL AORTOGRAM N/A 11/15/2018   Procedure: ABDOMINAL AORTOGRAM;  Surgeon: [name], MD;  Location: [place] [facility];  Service: Cardiovascular;  Laterality: N/A;  . AMPUTATION Left 12/06/2018   Procedure:  AMPUTATION DIGIT LEFT FIFTH TOE;  Surgeon: Angelia Mould, MD;  Location: [place];  Service: Vascular;  Laterality: Left;  . AMPUTATION Bilateral 12/29/2018   Procedure: AMPUTATION ABOVE KNEE;  Surgeon: Waynetta Sandy, MD;  Location: [place];  Service: Vascular;  Laterality: Bilateral;  . APPLICATION OF WOUND VAC Right 02/17/2019   Procedure: Application Of Wound Vac;  Surgeon: Marty Heck, MD;  Location: [place];  Service: Vascular;  Laterality: Right;  . BVT  3/97/67   Left  Basilic  Vein Transposition  . CHOLECYSTECTOMY    . EYE SURGERY     Catarct bil  . I & D EXTREMITY Right 02/17/2019   Procedure: Right above the kneee debridement;  Surgeon: Marty Heck, MD;  Location: [place];  Service: Vascular;  Laterality: Right;  . INSERTION OF DIALYSIS CATHETER  05/28/2012   Procedure: INSERTION OF DIALYSIS CATHETER;  Surgeon: Mal Misty, MD;  Location: [place];  Service: Vascular;  Laterality: Right;  . INSERTION OF DIALYSIS CATHETER Right 01/02/2020   Procedure: INSERTION OF DIALYSIS CATHETER RIGHT INTERNAL JUGULAR;  Surgeon: Elam Dutch, MD;  Location: [place];  Service: Vascular;  Laterality: Right;  . INSERTION OF DIALYSIS CATHETER Right 01/03/2020   Procedure: INSERTION OF DIALYSIS CATHETER RIGHT INTERNAL JUGULAR;  Surgeon: Rosetta Posner, MD;  Location: [place];  Service: Vascular;  Laterality: Right;  . Left arm shuntogram.    . Left forearm loop graft with 6 mm Gore-Tex graft.    . LOWER EXTREMITY ANGIOGRAPHY Bilateral 11/15/2018   Procedure: LOWER EXTREMITY ANGIOGRAPHY;  Surgeon: [name], MD;  Location: [place] [facility];  Service: Cardiovascular;  Laterality: Bilateral;  . Pars plana vitrectomy with 25-gauge system    . PERIPHERAL VASCULAR ATHERECTOMY Left 11/15/2018   Procedure: PERIPHERAL VASCULAR ATHERECTOMY;  Surgeon: [name], MD;  Location: [place] [facility];  Service: Cardiovascular;  Laterality: Left;  SFA with STENT  . PERIPHERAL VASCULAR BALLOON ANGIOPLASTY Left 11/15/2018   Procedure: PERIPHERAL VASCULAR BALLOON ANGIOPLASTY;  Surgeon: [name], MD;  Location: [place] [facility];  Service: Cardiovascular;  Laterality: Left;  PT  . REVISON OF ARTERIOVENOUS FISTULA Left 01/02/2020   Procedure: REVISON OF LEFT ARM ARTERIOVENOUS FISTULA;  Surgeon: Elam Dutch, MD;  Location: [place];  Service: Vascular;  Laterality: Left;    Allergies  Allergen Reactions  . Occlusive Silicone Sheets [Silicone] Other (See Comments)     "Allergic," per MAR  . Other Other (See Comments)    Unknown reaction to Occlusive adhesive- "Allergic," per MAR  . Sulfa Antibiotics   . Tape Itching and Other (See Comments)    Use Cloth tape only, please Use Cloth tape only, please    Allergies as of 06/26/2020      Reactions   Occlusive Silicone Sheets [silicone] Other (See Comments)   "Allergic," per [facility]   Other Other (See Comments)   Unknown reaction to Occlusive adhesive- "Allergic," per MAR   Sulfa Antibiotics    Tape Itching, Other (See Comments)   Use Cloth tape only, please Use Cloth tape only, please      Medication List       Accurate as of June 26, 2020  9:47 AM. If you have any questions, ask your nurse or doctor.        acetaminophen 500 MG tablet Commonly known as: TYLENOL Take 1,000 mg by mouth 2 (two) times daily.   ADULT NUTRITIONAL SUPPLEMENT PO Take 1 each  by mouth daily with supper. Magic Cup with dinner   NEPRO PO Take 1 Can by mouth 2 (two) times daily.   atorvastatin 20 MG tablet Commonly known as: LIPITOR Take 20 mg by mouth daily.   dicyclomine 20 MG tablet Commonly known as: BENTYL Take 20 mg by mouth 3 (three) times daily before meals.   divalproex 125 MG capsule Commonly known as: DEPAKOTE SPRINKLE Take 250 mg by mouth at bedtime.   feeding supplement (PRO-STAT SUGAR FREE 64) Liqd Take 30 mLs by mouth 2 (two) times daily. 9a and 5p   Hectorol 4 MCG/2ML injection Generic drug: doxercalciferol Inject 2 mcg into the vein every Monday, Wednesday, and Friday with hemodialysis.   HYDROcodone-acetaminophen 5-325 MG tablet Commonly known as: NORCO/VICODIN Take 1 tablet by mouth 2 (two) times daily. For Pain   loperamide 2 MG tablet Commonly known as: IMODIUM A-D Take 4 mg by mouth See admin instructions. Take 2 tablets ( 4 mg) by mouth after 1st loose stool as needed for diarrhea   metoprolol tartrate 25 MG tablet Commonly known as: LOPRESSOR Give 1 tablet by mouth at bedtime on  Mon Wed ,Fri   metoprolol tartrate 25 MG tablet Commonly known as: LOPRESSOR Give 1 tablet by mouth twice daily on Tues ,Thur ,Sat ,Sun   midodrine 10 MG tablet Commonly known as: PROAMATINE Take one tablet (10 mg) by mouth on Monday, Wednesday, Friday 30 minutes prior to dialysis.   multivitamin Tabs tablet Take 1 tablet by mouth at bedtime.   NON FORMULARY DIET :Regular NAS Diet with Double meat Portion,1200 ml fluid restriction. Diet liberalized to promote PO Intake.   pantoprazole 40 MG tablet Commonly known as: PROTONIX Take 1 tablet (40 mg total) by mouth daily.   sevelamer carbonate 800 MG tablet Commonly known as: RENVELA Take 1,600 mg by mouth 3 (three) times daily with meals.   Vitamin D3 1.25 MG (50000 UT) Caps Take 50,000 Units by mouth every Friday.   warfarin 3 MG tablet Commonly known as: COUMADIN Take 3 mg tablet with 0.5 mg to = 3.5 mg tues. Thurs, sat, sun   warfarin 1 MG tablet Commonly known as: COUMADIN Take 0.5 mg by mouth. Take 0.5 along with a 3 mg tablet to = 3.5 mg on Tues, Thur, Sat, Sun   warfarin 4 MG tablet Commonly known as: COUMADIN Take 4 mg by mouth every Monday, Wednesday, and Friday.       Review of Systems  Constitutional: Negative for appetite change, chills, fatigue and fever.  HENT: Negative for congestion, postnasal drip, rhinorrhea, sinus pressure, sinus pain, sneezing, sore throat and trouble swallowing.   Eyes: Negative for discharge, redness, itching and visual disturbance.  Respiratory: Negative for cough, chest tightness, shortness of breath and wheezing.   Cardiovascular: Negative for chest pain and palpitations.       Bilateral AKA   Gastrointestinal: Negative for abdominal distention, abdominal pain, constipation, diarrhea, nausea and vomiting.  Endocrine: Negative for cold intolerance, heat intolerance, polydipsia, polyphagia and polyuria.  Genitourinary: Negative for dysuria, flank pain, frequency and urgency.        On  Dialysis mon,Wed and Fri  Musculoskeletal: Negative for arthralgias, back pain, joint swelling and myalgias.  Skin: Negative for color change, pallor and rash.  Neurological: Negative for dizziness, speech difficulty, light-headedness, numbness and headaches.  Hematological: Does not bruise/bleed easily.  Psychiatric/Behavioral: Negative for agitation, behavioral problems, confusion and sleep disturbance. The patient is not nervous/anxious.     Immunization History  Administered Date(s) Administered  . Influenza-Unspecified 08/29/2019  . Moderna SARS-COVID-2 Vaccination 12/28/2019, 01/25/2020  . Pneumococcal Polysaccharide-23 10/07/2019   Pertinent  Health Maintenance Due  Topic Date Due  . FOOT EXAM  Never done  . HEMOGLOBIN A1C  07/19/2020  . INFLUENZA VACCINE  07/28/2020  . PNA vac Low Risk Adult (2 of 2 - PCV13) 10/06/2020  . OPHTHALMOLOGY EXAM  12/18/2020  . URINE MICROALBUMIN  Discontinued   No flowsheet data found.   Vitals:   06/26/20 0920  BP: 92/82  Pulse: 60  Resp: 16  Temp: (!) 97.3 F (36.3 C)  SpO2: 95%  Weight: 118 lb 3.2 oz (53.6 kg)  Height: 3' (0.914 m)   Body mass index is 64.12 kg/m. Physical Exam Vitals reviewed.  Constitutional:      General: He is not in acute distress.    Appearance: He is not ill-appearing.  HENT:     Head: Normocephalic.     Nose: Nose normal. No congestion or rhinorrhea.     Mouth/Throat:     Mouth: Mucous membranes are moist.     Pharynx: Oropharynx is clear. No oropharyngeal exudate or posterior oropharyngeal erythema.  Eyes:     General: No scleral icterus.       Right eye: No discharge.     Extraocular Movements: Extraocular movements intact.     Conjunctiva/sclera: Conjunctivae normal.     Pupils: Pupils are equal, round, and reactive to light.  Cardiovascular:     Rate and Rhythm: Normal rate and regular rhythm.     Heart sounds: Murmur heard.  No friction rub. No gallop.      Comments: Bilateral AKA    Pulmonary:     Effort: Pulmonary effort is normal. No respiratory distress.     Breath sounds: Normal breath sounds. No wheezing, rhonchi or rales.  Chest:     Chest wall: No tenderness.  Abdominal:     General: Bowel sounds are normal. There is no distension.     Palpations: Abdomen is soft. There is no mass.     Tenderness: There is no abdominal tenderness. There is no right CVA tenderness, left CVA tenderness, guarding or rebound.  Musculoskeletal:        General: No swelling or tenderness.     Comments: Self propels on wheelchair.bilateral AKA   Skin:    General: Skin is warm and dry.     Coloration: Skin is not pale.     Findings: No bruising, erythema or rash.  Neurological:     Mental Status: He is alert and oriented to person, place, and time.     Cranial Nerves: No cranial nerve deficit.     Motor: No weakness.  Psychiatric:        Mood and Affect: Mood normal.        Behavior: Behavior normal.        Thought Content: Thought content normal.        Judgment: Judgment normal.     Labs reviewed: Recent Labs    07/27/19 1529 07/27/19 1529 08/16/19 1818 01/02/20 0841 01/03/20 1106  NA 138   < > 136 140 138  K 3.5   < > 4.0 3.6 3.7  CL 95*   < > 97* 95* 101  CO2 26  --  25  --   --   GLUCOSE 143*   < > 75 120* 102*  BUN 48*   < > 28* 28* 38*  CREATININE 4.61*   < >  3.59* 3.80* 5.90*  CALCIUM 10.2  --  9.5  --   --    < > = values in this interval not displayed.   Recent Labs    08/16/19 1818  AST 54*  ALT 38  ALKPHOS 229*  BILITOT 1.2  PROT 7.8  ALBUMIN 2.9*   Recent Labs    07/27/19 1529 07/27/19 1529 08/16/19 1818 01/02/20 0841 01/03/20 1106  WBC 18.6*  --  6.9  --   --   NEUTROABS 15.5*  --  4.4  --   --   HGB 12.4*   < > 11.0* 11.9* 10.2*  HCT 40.2   < > 34.8* 35.0* 30.0*  MCV 89.3  --  89.5  --   --   PLT 171  --  193  --   --    < > = values in this interval not displayed.   Lab Results  Component Value Date   TSH 1.839 05/25/2019    Lab Results  Component Value Date   HGBA1C 3.9 01/20/2020   Lab Results  Component Value Date   CHOL 113 11/11/2018   HDL 39 (L) 11/11/2018   LDLCALC 62 11/11/2018   TRIG 60 11/11/2018   CHOLHDL 2.9 11/11/2018    Significant Diagnostic Results in last 30 days:  No results found.  Assessment/Plan 1. PAF (paroxysmal atrial fibrillation) (HCC) HR controlled. - continue on coumadin 4 mg tablet on Mon-Wed-Fri alternating with 3.5 mg tablet on sat,Sun,Tue and Thur.latest  INR 2.8 (06/13/2020) therapeutic. CBC/diff   2. Chronic diastolic congestive heart failure (HCC) No signs of fluid overload.will continue to monitor weight. CBC/diff  CMP   3. 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 Hyperlipidemia associated with type 2 diabetes mellitus (HCC) Latest LDL at goal. - continue onon atorvastatin 20 mg tablet daily - Lipid panel - Hgb A1C      5. 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HCC) Continue on Norco 1 tablet twice daily.  6. 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 - continue on metoprolol tartrate 25 mg tablet twice daily on non-dialysis days and once a day on dialysis days CBC/diff  CMP TSH  Family/ staff Communication: Reviewed plan of care with patient and facility Nurse.  Labs/tests ordered:  CBC/diff  CMP TSH  Lipid panel   Hgb A1C

## 2020-07-03 ENCOUNTER — Encounter (HOSPITAL_COMMUNITY): Payer: Self-pay

## 2020-07-03 ENCOUNTER — Other Ambulatory Visit: Payer: Self-pay

## 2020-07-03 ENCOUNTER — Emergency Department (HOSPITAL_COMMUNITY)
Admission: EM | Admit: 2020-07-03 | Discharge: 2020-07-03 | Disposition: A | Discharge status: Home / Self Care | Payer: Medicare Other | Attending: Emergency Medicine | Admitting: Emergency Medicine

## 2020-07-03 DIAGNOSIS — R55 Syncope and collapse: Secondary | ICD-10-CM | POA: Diagnosis not present

## 2020-07-03 DIAGNOSIS — F0390 Unspecified dementia without behavioral disturbance: Secondary | ICD-10-CM | POA: Diagnosis not present

## 2020-07-03 DIAGNOSIS — Z7901 Long term (current) use of anticoagulants: Secondary | ICD-10-CM | POA: Insufficient documentation

## 2020-07-03 DIAGNOSIS — Z79899 Other long term (current) drug therapy: Secondary | ICD-10-CM | POA: Insufficient documentation

## 2020-07-03 DIAGNOSIS — Z992 Dependence on renal dialysis: Secondary | ICD-10-CM | POA: Insufficient documentation

## 2020-07-03 DIAGNOSIS — N186 End stage renal disease: Secondary | ICD-10-CM

## 2020-07-03 DIAGNOSIS — I5032 Chronic diastolic (congestive) heart failure: Secondary | ICD-10-CM | POA: Insufficient documentation

## 2020-07-03 DIAGNOSIS — R4182 Altered mental status, unspecified: Secondary | ICD-10-CM | POA: Diagnosis present

## 2020-07-03 DIAGNOSIS — E1122 Type 2 diabetes mellitus with diabetic chronic kidney disease: Secondary | ICD-10-CM | POA: Insufficient documentation

## 2020-07-03 DIAGNOSIS — Z89612 Acquired absence of left leg above knee: Secondary | ICD-10-CM | POA: Insufficient documentation

## 2020-07-03 DIAGNOSIS — I132 Hypertensive heart and chronic kidney disease with heart failure and with stage 5 chronic kidney disease, or end stage renal disease: Secondary | ICD-10-CM | POA: Insufficient documentation

## 2020-07-03 DIAGNOSIS — Z87891 Personal history of nicotine dependence: Secondary | ICD-10-CM | POA: Insufficient documentation

## 2020-07-03 DIAGNOSIS — Z89611 Acquired absence of right leg above knee: Secondary | ICD-10-CM | POA: Diagnosis not present

## 2020-07-03 LAB — CBC WITH DIFFERENTIAL/PLATELET
Abs Immature Granulocytes: 0.03 10*3/uL (ref 0.00–0.07)
Basophils Absolute: 0.1 10*3/uL (ref 0.0–0.1)
Basophils Relative: 1 %
Eosinophils Absolute: 0.5 10*3/uL (ref 0.0–0.5)
Eosinophils Relative: 7 %
HCT: 35.1 % — ABNORMAL LOW (ref 39.0–52.0)
Hemoglobin: 11.2 g/dL — ABNORMAL LOW (ref 13.0–17.0)
Immature Granulocytes: 0 %
Lymphocytes Relative: 17 %
Lymphs Abs: 1.2 10*3/uL (ref 0.7–4.0)
MCH: 33 pg (ref 26.0–34.0)
MCHC: 31.9 g/dL (ref 30.0–36.0)
MCV: 103.5 fL — ABNORMAL HIGH (ref 80.0–100.0)
Monocytes Absolute: 0.9 10*3/uL (ref 0.1–1.0)
Monocytes Relative: 13 %
Neutro Abs: 4.2 10*3/uL (ref 1.7–7.7)
Neutrophils Relative %: 62 %
Platelets: 133 10*3/uL — ABNORMAL LOW (ref 150–400)
RBC: 3.39 MIL/uL — ABNORMAL LOW (ref 4.22–5.81)
RDW: 14.3 % (ref 11.5–15.5)
WBC: 6.9 10*3/uL (ref 4.0–10.5)
nRBC: 0 % (ref 0.0–0.2)

## 2020-07-03 LAB — BASIC METABOLIC PANEL
Anion gap: 15 (ref 5–15)
BUN: 51 mg/dL — ABNORMAL HIGH (ref 8–23)
CO2: 31 mmol/L (ref 22–32)
Calcium: 9.5 mg/dL (ref 8.9–10.3)
Chloride: 96 mmol/L — ABNORMAL LOW (ref 98–111)
Creatinine, Ser: 5.17 mg/dL — ABNORMAL HIGH (ref 0.61–1.24)
GFR calc Af Amer: 11 mL/min — ABNORMAL LOW (ref >60)
GFR calc non Af Amer: 10 mL/min — ABNORMAL LOW (ref >60)
Glucose, Bld: 118 mg/dL — ABNORMAL HIGH (ref 70–99)
Potassium: 3.7 mmol/L (ref 3.5–5.1)
Sodium: 142 mmol/L (ref 135–145)

## 2020-07-03 NOTE — ED Provider Notes (Signed)
[facility] EMERGENCY DEPARTMENT Provider Note   CSN: [id number] Arrival date & time: 07/03/20  1953     History Chief Complaint  Patient presents with  . Altered Mental Status    [name] is a 79 y.o. 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  History is provided by triage note and EMS.  Patient was seen and evaluated at [facility] regional earlier today for syncope.  He was sent back to his facility who then sent him back to the ED due to altered mental status and unresponsiveness.  Patient presented to [facility] regional earlier today after experiencing a syncopal episode while in dialysis for 20 minutes.  Patient with a history of dementia at baseline.  Patient denying any headache, blurry vision, numbness in arms or legs, chest pain, abdominal pain, injuries or falls.  States that he has not missed any dialysis sessions recently.  HPI     Past Medical History:  Diagnosis Date  . A-fib ([place])   . Anemia   . Aortic stenosis   . Blood transfusion   . BPH (benign prostatic hyperplasia)   . CHF (congestive heart failure) ([place])   . Diarrhea   . DM (diabetes mellitus) ([place])    Type II-   not on medication   . ESRD on hemodialysis ([place])    Started dialysis in 2009  . History of GI bleed    secondary to coumadin  . HTN (hypertension)   . Hyperlipidemia   . OSA (obstructive sleep apnea)    uses CPAP  . Sacral wound    stage II - healed, has a protective dressing on- 12/28/2019  . Secondary hyperparathyroidism of renal origin [facility])     Patient Active Problem List   Diagnosis Date Noted  . Anticoagulated on Coumadin 12/24/2019  . Infection due to ESBL-producing Escherichia coli 08/12/2019  . MRSA cellulitis 08/12/2019  . Sacral decubitus ulcer, stage IV ([place]) 08/05/2019  . Infected decubitus ulcer, stage  IV ([place]) 08/05/2019  . Hypertensive heart and kidney disease with chronic diastolic congestive heart failure and stage 5 chronic kidney disease on chronic dialysis ([place]) 03/24/2019  . End stage renal disease on dialysis due to type 2 diabetes mellitus ([place]) 03/24/2019  . 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 ([place]) 03/24/2019  . Anemia due to end stage renal disease ([place]) 03/24/2019  . Protein-calorie malnutrition, severe ([place]) 03/24/2019  . Hyperlipidemia associated with type 2 diabetes mellitus ([place]) 02/22/2019  . Amputation stump infection ([place]) 02/15/2019  . Conjunctivitis of left eye 01/04/2019  . 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 Open wound of left foot   . Encounter for monitoring Coumadin therapy   . OSA (obstructive sleep apnea)   . ESRD on hemodialysis ([place])   . BPH (benign prostatic hyperplasia)   . Hypotension 12/07/2018  . GERD (gastroesophageal reflux disease) 12/07/2018  . Pressure injury of skin 12/03/2018  . Symptomatic anemia 12/02/2018  . Acute on chronic blood loss anemia 12/02/2018  . 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[place]) 12/02/2018  . Lower GI bleeding 12/02/2018  . Heme positive stool   . Acute on chronic diastolic (congestive) heart failure ([place]) 11/19/2018  . Acute CVA (cerebrovascular accident) ([place]) 11/19/2018  . Hypoglycemia 11/19/2018  . Peripheral vascular disease due to secondary diabetes ([place]) 11/19/2018  . Dry gangrene ([place]) 11/19/2018  .  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 DNR (do not resuscitate) discussion   . Goals of care, counseling/discussion   . Palliative care by specialist   . Acute hypoxemic respiratory failure ([place]) 11/06/2018  . Volume overload 11/06/2018  . Multifocal pneumonia 11/06/2018  . Acute metabolic encephalopathy 24/58/0998  . Physical deconditioning 11/06/2018  . Acute respiratory failure with hypoxia and hypercapnia ([place]) 11/04/2018  . CAP (community acquired  pneumonia) 11/04/2018  . Pleural effusion, right 11/04/2018  . Increased ammonia level 11/04/2018  . Abnormal CT of the abdomen 11/04/2018  . Atrial fibrillation with RVR ([place]) 11/04/2018  . Sepsis ([place]) 10/21/2018  . Acute encephalopathy 10/21/2018  . Elevated alkaline phosphatase level 10/21/2018  . Chronic anemia 10/21/2018  . Thrombocytopenia ([place]) 10/21/2018  . Pulmonary HTN ([place]) 03/04/2017  . Aortic valve stenosis 03/04/2017  . Hypertension, accelerated with heart disease, without CHF 01/12/2017  . Dyspnea on exertion 01/12/2017  . ESRD (end stage renal disease) ([place]) 06/07/2012  . Hyperlipidemia 04/24/2011  . HYPERTENSION, BENIGN 10/24/2009  . Type 2 diabetes mellitus with hypertension and end stage renal disease on dialysis ([place]) 10/23/2009  . OBSTRUCTIVE SLEEP APNEA 10/23/2009  . PAF (paroxysmal atrial fibrillation) ([place]) 10/23/2009  . CHF (congestive heart failure) ([place]) 10/23/2009    Past Surgical History:  Procedure Laterality Date  . ABDOMINAL AORTOGRAM N/A 11/15/2018   Procedure: ABDOMINAL AORTOGRAM;  Surgeon: [name], MD;  Location: [facility] CV LAB;  Service: Cardiovascular;  Laterality: N/A;  . AMPUTATION Left 12/06/2018   Procedure: AMPUTATION DIGIT LEFT FIFTH TOE;  Surgeon: Angelia Mould, MD;  Location: [facility];  Service: Vascular;  Laterality: Left;  . AMPUTATION Bilateral 12/29/2018   Procedure: AMPUTATION ABOVE KNEE;  Surgeon: Waynetta Sandy, MD;  Location: [facility];  Service: Vascular;  Laterality: Bilateral;  . APPLICATION OF WOUND VAC Right 02/17/2019   Procedure: Application Of Wound Vac;  Surgeon: Marty Heck, MD;  Location: [facility];  Service: Vascular;  Laterality: Right;  . BVT  3/38/25   Left  Basilic Vein Transposition  . CHOLECYSTECTOMY    . EYE SURGERY     Catarct bil  . I & D EXTREMITY Right 02/17/2019   Procedure: Right above the kneee debridement;  Surgeon: Marty Heck, MD;  Location: [facility];  Service:  Vascular;  Laterality: Right;  . INSERTION OF DIALYSIS CATHETER  05/28/2012   Procedure: INSERTION OF DIALYSIS CATHETER;  Surgeon: Mal Misty, MD;  Location: [facility];  Service: Vascular;  Laterality: Right;  . INSERTION OF DIALYSIS CATHETER Right 01/02/2020   Procedure: INSERTION OF DIALYSIS CATHETER RIGHT INTERNAL JUGULAR;  Surgeon: Elam Dutch, MD;  Location: [facility];  Service: Vascular;  Laterality: Right;  . INSERTION OF DIALYSIS CATHETER Right 01/03/2020   Procedure: INSERTION OF DIALYSIS CATHETER RIGHT INTERNAL JUGULAR;  Surgeon: Rosetta Posner, MD;  Location: [facility];  Service: Vascular;  Laterality: Right;  . Left arm shuntogram.    . Left forearm loop graft with 6 mm Gore-Tex graft.    . LOWER EXTREMITY ANGIOGRAPHY Bilateral 11/15/2018   Procedure: LOWER EXTREMITY ANGIOGRAPHY;  Surgeon: [name], MD;  Location: [place] CV LAB;  Service: Cardiovascular;  Laterality: Bilateral;  . Pars plana vitrectomy with 25-gauge system    . PERIPHERAL VASCULAR ATHERECTOMY Left 11/15/2018   Procedure: PERIPHERAL VASCULAR ATHERECTOMY;  Surgeon: [name], MD;  Location: [facility] CV LAB;  Service: Cardiovascular;  Laterality: Left;  SFA with STENT  . PERIPHERAL  VASCULAR BALLOON ANGIOPLASTY Left 11/15/2018   Procedure: PERIPHERAL VASCULAR BALLOON ANGIOPLASTY;  Surgeon: [name], MD;  Location: [facility] CV LAB;  Service: Cardiovascular;  Laterality: Left;  PT  . REVISON OF ARTERIOVENOUS FISTULA Left 01/02/2020   Procedure: REVISON OF LEFT ARM ARTERIOVENOUS FISTULA;  Surgeon: Elam Dutch, MD;  Location: [facility] OR;  Service: Vascular;  Laterality: Left;       Family History  Problem Relation Age of Onset  . Alzheimer's disease Mother   . Diabetes Father        Amputation:  bilateral legs  . Cancer Daughter        breast cancer  . Diabetes Son   . Heart disease Son        before age 3  . Hypertension Son   . Anesthesia problems Neg Hx     Social History    Tobacco Use  . Smoking status: Former Smoker    Types: Cigarettes    Quit date: 06/30/2004    Years since quitting: 16.0  . Smokeless tobacco: Never Used  Vaping Use  . Vaping Use: Never used  Substance Use Topics  . Alcohol use: No  . Drug use: No    Home Medications Prior to Admission medications   Medication Sig Start Date End Date Taking? Authorizing Provider  acetaminophen (TYLENOL) 500 MG tablet Take 1,000 mg by mouth 2 (two) times daily.     [provider]  Amino Acids-Protein Hydrolys (FEEDING SUPPLEMENT, PRO-STAT SUGAR FREE 64,) LIQD Take 30 mLs by mouth 2 (two) times daily. 9a and 5p    [provider]  atorvastatin (LIPITOR) 20 MG tablet Take 20 mg by mouth daily. 05/31/19   [provider]  Cholecalciferol (VITAMIN D3) 1.25 MG (50000 UT) CAPS Take 50,000 Units by mouth every Friday.     [provider]  dicyclomine (BENTYL) 20 MG tablet Take 20 mg by mouth 3 (three) times daily before meals.    [provider]  divalproex (DEPAKOTE SPRINKLE) 125 MG capsule Take 250 mg by mouth at bedtime.    [provider]  doxercalciferol (HECTOROL) 4 MCG/2ML injection Inject 2 mcg into the vein every Monday, Wednesday, and Friday with hemodialysis.    [provider]  HYDROcodone-acetaminophen (NORCO/VICODIN) 5-325 MG tablet Take 1 tablet by mouth 2 (two) times daily. For Pain 06/24/20   [place] Lewis C, PA-C  loperamide (IMODIUM A-D) 2 MG tablet Take 4 mg by mouth See admin instructions. Take 2 tablets ( 4 mg) by mouth after 1st loose stool as needed for diarrhea    [provider]  metoprolol tartrate (LOPRESSOR) 25 MG tablet Give 1 tablet by mouth at bedtime on Mon Wed ,Fri    [provider]  metoprolol tartrate (LOPRESSOR) 25 MG tablet Give 1 tablet by mouth twice daily on Tues ,Thur ,Sat ,Sun    [provider]  midodrine (PROAMATINE) 10 MG tablet Take one tablet (10 mg) by mouth on Monday,  Wednesday, Friday 30 minutes prior to dialysis.    [provider]  multivitamin (RENA-VIT) TABS tablet Take 1 tablet by mouth at bedtime.    [provider]  NON FORMULARY DIET :Regular NAS Diet with Double meat Portion,1200 ml fluid restriction. Diet liberalized to promote PO Intake.    [provider]  Nutritional Supplements (ADULT NUTRITIONAL SUPPLEMENT PO) Take 1 each by mouth daily with supper. Magic Cup with dinner    [provider]  Nutritional Supplements (NEPRO  PO) Take 1 Can by mouth 2 (two) times daily.     [provider]  pantoprazole (PROTONIX) 40 MG tablet Take 1 tablet (40 mg total) by mouth daily. 01/01/19   Domenic Polite, MD  sevelamer carbonate (RENVELA) 800 MG tablet Take 1,600 mg by mouth 3 (three) times daily with meals.    [provider]  warfarin (COUMADIN) 1 MG tablet Take 0.5 mg by mouth. Take 0.5 along with a 3 mg tablet to = 3.5 mg on Tues, Thur, Sat, Sun    [provider]  warfarin (COUMADIN) 3 MG tablet Take 3 mg tablet with 0.5 mg to = 3.5 mg tues. Thurs, sat, sun    [provider]  warfarin (COUMADIN) 4 MG tablet Take 4 mg by mouth every Monday, Wednesday, and Friday.    [provider]    Allergies    Occlusive silicone sheets [silicone], Other, Sulfa antibiotics, and Tape  Review of Systems   Review of Systems  Unable to perform ROS: Dementia  Constitutional: Negative for fever.  Respiratory: Negative for shortness of breath.   Cardiovascular: Negative for chest pain.  Gastrointestinal: Negative for vomiting.  Neurological: Negative for headaches.    Physical Exam Updated Vital Signs BP (!) 161/61 (BP Location: Right Arm)   Pulse 69   Temp 97.8 F (36.6 C) (Oral)   Resp 18   Ht 3' (0.914 m)   Wt 71.2 kg   SpO2 100%   BMI 85.17 kg/m   Physical Exam Vitals and nursing note reviewed.  Constitutional:      General: He is not in acute distress.    Appearance:  He is well-developed.  HENT:     Head: Normocephalic and atraumatic.     Nose: Nose normal.  Eyes:     General: No scleral icterus.       Right eye: No discharge.        Left eye: No discharge.     Conjunctiva/sclera: Conjunctivae normal.     Pupils: Pupils are equal, round, and reactive to light.  Cardiovascular:     Rate and Rhythm: Normal rate and regular rhythm.     Heart sounds: Normal heart sounds. No murmur heard.  No friction rub. No gallop.   Pulmonary:     Effort: Pulmonary effort is normal. No respiratory distress.     Breath sounds: Normal breath sounds.  Abdominal:     General: Bowel sounds are normal. There is no distension.     Palpations: Abdomen is soft.     Tenderness: There is no abdominal tenderness. There is no guarding.  Musculoskeletal:        General: Normal range of motion.     Cervical back: Normal range of motion and neck supple.     Comments: Bilateral AKA's.  Skin:    General: Skin is warm and dry.     Findings: No rash.  Neurological:     General: No focal deficit present.     Mental Status: He is alert. Mental status is at baseline.     Cranial Nerves: No cranial nerve deficit.     Sensory: No sensory deficit.     Motor: No weakness or abnormal muscle tone.     Coordination: Coordination normal.     Comments: Alert, oriented to self, place and situation.  Disoriented to time.  No facial asymmetry noted. Pupils reactive.  Cranial nerves appear grossly intact. Sensation intact to light touch on face, BUE and BLE.  Strength 5/5 in BUE.     ED Results / Procedures / Treatments   Labs (all labs ordered are listed, but only abnormal results are displayed) Labs Reviewed  BASIC METABOLIC PANEL - Abnormal; Notable for the following components:      Result Value   Chloride 96 (*)    Glucose, Bld 118 (*)    BUN 51 (*)    Creatinine, Ser 5.17 (*)    GFR calc non Af Amer 10 (*)    GFR calc Af Amer 11 (*)    All other components within normal limits   CBC WITH DIFFERENTIAL/PLATELET - Abnormal; Notable for the following components:   RBC 3.39 (*)    Hemoglobin 11.2 (*)    HCT 35.1 (*)    MCV 103.5 (*)    Platelets 133 (*)    All other components within normal limits    EKG EKG Interpretation  Date/Time:  Wednesday July 03 2020 20:16:01 EDT Ventricular Rate:  67 PR Interval:    QRS Duration: 123 QT Interval:  497 QTC Calculation: 525 R Axis:   -118 Text Interpretation: Atrial flutter with predominant 4:1 AV block Nonspecific IVCD with LAD Anterolateral infarct, old Confirmed by Fredia Sorrow [phone]) on 07/03/2020 8:29:03 PM   Radiology No results found.  Procedures Procedures (including critical care time)  Medications Ordered in ED Medications - No data to display  ED Course  I have reviewed the triage vital signs and the nursing notes.  Pertinent labs & imaging results that were available during my care of the patient were reviewed by me and considered in my medical decision making (see chart for details).    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  It appears that patient presented to [facility] earlier today for syncopal episode during dialysis.  He was medically cleared at [facility] and sent back to his facility.  However facility was concerned that he was altered and sent him back to the ER.  I reviewed his work-up from [facility] regional earlier today which showed troponin at baseline of 31, BNP of 604, stable hemoglobin at baseline, normal potassium level, chest x-ray showing moderate amount of volume overload.  EKG at the time showed nonspecific T wave abnormality.  On my exam patient is alert, oriented to self, place which appears to be his baseline.  States that he feels overall well, he is denying  any chest pain, abdominal pain, headache, injuries or falls.  He has bilateral AKA's.  States that he has not missed any dialysis sessions other than leaving early today.  EKG shows atrial flutter.  Unremarkable CBC, BMP, creatinine similar to baseline and normal potassium level.  Patient remains in no acute distress at this time.  I feel that based on my work-up today as well as his work-up earlier today he is medically cleared low suspicion for any emergent cause of his symptoms.  Patient is comfortable with discharge home with continued dialysis and PCP follow-up.   Patient is hemodynamically stable, in NAD. Evaluation does not show pathology that would require ongoing emergent intervention or inpatient treatment. I explained the diagnosis to the patient. Pain has been managed and has no complaints prior to  discharge. Patient is comfortable with above plan and is stable for discharge at this time. All questions were answered prior to disposition. Strict return precautions for returning to the ED were discussed. Encouraged follow up with PCP.   An After Visit Summary was printed and given to the patient.   Portions of this note were generated with Lobbyist. Dictation errors may occur despite best attempts at proofreading.   Final Clinical Impression(s) / ED Diagnoses Final diagnoses:  ESRD on dialysis [facility])    Rx / DC Orders ED Discharge Orders    None       Delia Heady, PA-C 07/03/20 2152    Fredia Sorrow, MD 07/03/20 2311

## 2020-07-03 NOTE — ED Notes (Signed)
[facility] called to transport pt 

## 2020-07-03 NOTE — ED Triage Notes (Signed)
Pt comes from [facility] via [facility], pt is a dialysis pt, did not have treatment today had syncopal episode and then went to [facility] regional, when transported back to the SNF by [facility] they refused him and stated he was AMS and unresponsive, pt responded to sternal rub and pt is AXO x 3, disoriented to time.

## 2020-07-03 NOTE — Discharge Instructions (Signed)
Continue your dialysis as regularly scheduled. Follow-up with your primary care provider. Return to the ER if you start to experience chest pain, shortness of breath, injuries or falls, headache or blurry vision.

## 2020-07-03 NOTE — ED Notes (Addendum)
Report called to RN Cherie Ouch , at [facility], all questions answered, Transport has been set up by Network engineer , it is noted transportation may be a while

## 2020-07-05 LAB — HEPATIC FUNCTION PANEL
ALT: 20 (ref 10–40)
AST: 23 (ref 14–40)

## 2020-07-05 LAB — COMPREHENSIVE METABOLIC PANEL
Albumin: 4.2 (ref 3.5–5.0)
Calcium: 10 (ref 8.7–10.7)
GFR calc Af Amer: <15
GFR calc non Af Amer: <15
Globulin: 2.9

## 2020-07-05 LAB — CBC AND DIFFERENTIAL
HCT: 33 — AB (ref 41–53)
Hemoglobin: 10.9 — AB (ref 13.5–17.5)
Platelets: 135 — AB (ref 150–399)
WBC: 6.5

## 2020-07-05 LAB — LIPID PANEL
Cholesterol: 97 (ref 0–200)
HDL: 38 (ref 35–70)
LDL Cholesterol: 40
Triglycerides: 96 (ref 40–160)

## 2020-07-05 LAB — BASIC METABOLIC PANEL
BUN: 49 — AB (ref 4–21)
CO2: 29 — AB (ref 13–22)
Chloride: 99 (ref 99–108)
Creatinine: 4.2 — AB (ref 0.6–1.3)
Glucose: 84
Potassium: 4.2 (ref 3.4–5.3)
Sodium: 145 (ref 137–147)

## 2020-07-05 LAB — CBC: RBC: 3.2 — AB (ref 3.87–5.11)

## 2020-07-08 ENCOUNTER — Encounter: Payer: Self-pay | Admitting: Family

## 2020-07-08 ENCOUNTER — Non-Acute Institutional Stay (SKILLED_NURSING_FACILITY): Payer: Medicare Other | Admitting: Family

## 2020-07-08 DIAGNOSIS — D631 Anemia in chronic kidney disease: Secondary | ICD-10-CM | POA: Diagnosis not present

## 2020-07-08 DIAGNOSIS — N186 End stage renal disease: Secondary | ICD-10-CM

## 2020-07-08 DIAGNOSIS — E1169 Type 2 diabetes mellitus with other specified complication: Secondary | ICD-10-CM

## 2020-07-08 DIAGNOSIS — I480 Paroxysmal atrial fibrillation: Secondary | ICD-10-CM

## 2020-07-08 DIAGNOSIS — E785 Hyperlipidemia, unspecified: Secondary | ICD-10-CM

## 2020-07-08 NOTE — Progress Notes (Signed)
Location:    [facility].   Nursing Home Room Number: 414-D Place of Service:  SNF (31) Provider:  Marlowe Sax, NP    Patient Care Team: Gayland Curry, DO as PCP - General (Geriatric Medicine) [name], MD as Consulting Physician (Nephrology) Rehab, [facility], [facility]  Extended Emergency Contact Information Primary Emergency Contact: [name],[name] Address: [place], [place] [address] [name] of [place] Phone: [phone] Work Phone: [phone] Mobile Phone: [phone] Relation: Daughter Secondary Emergency Contact: [place] Phone: [phone] Mobile Phone: [phone] Relation: Other  Code Status:  DNR Goals of care: Advanced Directive information Advanced Directives 07/08/2020  Does Patient Have a Medical Advance Directive? Yes  Type of Paramedic of [place];Out of facility DNR (pink MOST or yellow form)  Does patient want to make changes to medical advance directive? No - Patient declined  Copy of [place] in Chart? Yes - validated most recent copy scanned in chart (See row information)  Would patient like information on creating a medical advance directive? -  Pre-existing out of facility DNR order (yellow form or pink MOST form) -     Chief Complaint  Patient presents with  . Acute Visit    Abnormal Labs.    HPI:  Pt is a 79 y.o. male seen today for an acute visit for abnormal lab results.[place] any acute issues.His recent Hgb 10.9,HCT 32.8,MCV 102.3,chol 97,TRG 96,LDL 40 (07/05/2020). His INR today was 1.9 current on coumadin 4 mg tablet on Tue,Thur,sat and Sunday and coumadin 3.5 mg tablet on Monday,wednesday and Friday.    Past Medical History:  Diagnosis Date  . A-fib ([place])   . Anemia   . Aortic stenosis   . Blood transfusion   . BPH (benign prostatic hyperplasia)   . CHF  (congestive heart failure) ([place])   . Diarrhea   . 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 ESRD on hemodialysis ([place])    Started dialysis in 2009  . History of GI bleed    secondary to coumadin  . HTN (hypertension)   . Hyperlipidemia   . OSA (obstructive sleep apnea)    uses CPAP  . Sacral wound    stage II - healed, has a protective dressing on- 12/28/2019  . Secondary hyperparathyroidism of renal origin [facility])    Past Surgical History:  Procedure Laterality Date  . ABDOMINAL AORTOGRAM N/A 11/15/2018   Procedure: ABDOMINAL AORTOGRAM;  Surgeon: [name], MD;  Location: [place] [facility];  Service: Cardiovascular;  Laterality: N/A;  . AMPUTATION Left 12/06/2018   Procedure: AMPUTATION DIGIT LEFT FIFTH TOE;  Surgeon: Angelia Mould, MD;  Location: [place];  Service: Vascular;  Laterality: Left;  . AMPUTATION Bilateral 12/29/2018   Procedure: AMPUTATION ABOVE KNEE;  Surgeon: Waynetta Sandy, MD;  Location: [place];  Service: Vascular;  Laterality: Bilateral;  . APPLICATION OF WOUND VAC Right 02/17/2019   Procedure: Application Of Wound Vac;  Surgeon: Marty Heck, MD;  Location: [place];  Service: Vascular;  Laterality: Right;  . BVT  0/97/35   Left  Basilic Vein Transposition  . CHOLECYSTECTOMY    . EYE SURGERY     Catarct bil  . I & D EXTREMITY Right 02/17/2019   Procedure: Right above the kneee debridement;  Surgeon: Marty Heck,  MD;  Location: [place];  Service: Vascular;  Laterality: Right;  . INSERTION OF DIALYSIS CATHETER  05/28/2012   Procedure: INSERTION OF DIALYSIS CATHETER;  Surgeon: Mal Misty, MD;  Location: [place];  Service: Vascular;  Laterality: Right;  . INSERTION OF DIALYSIS CATHETER Right 01/02/2020   Procedure: INSERTION OF DIALYSIS CATHETER RIGHT INTERNAL JUGULAR;  Surgeon: Elam Dutch, MD;  Location: [facility];  Service: Vascular;  Laterality: Right;  . INSERTION OF DIALYSIS CATHETER Right 01/03/2020    Procedure: INSERTION OF DIALYSIS CATHETER RIGHT INTERNAL JUGULAR;  Surgeon: Rosetta Posner, MD;  Location: [place];  Service: Vascular;  Laterality: Right;  . Left arm shuntogram.    . Left forearm loop graft with 6 mm Gore-Tex graft.    . LOWER EXTREMITY ANGIOGRAPHY Bilateral 11/15/2018   Procedure: LOWER EXTREMITY ANGIOGRAPHY;  Surgeon: [name], MD;  Location: [place] [facility];  Service: Cardiovascular;  Laterality: Bilateral;  . Pars plana vitrectomy with 25-gauge system    . PERIPHERAL VASCULAR ATHERECTOMY Left 11/15/2018   Procedure: PERIPHERAL VASCULAR ATHERECTOMY;  Surgeon: [name], MD;  Location: [place] [facility];  Service: Cardiovascular;  Laterality: Left;  SFA with STENT  . PERIPHERAL VASCULAR BALLOON ANGIOPLASTY Left 11/15/2018   Procedure: PERIPHERAL VASCULAR BALLOON ANGIOPLASTY;  Surgeon: [name], MD;  Location: [place] [facility];  Service: Cardiovascular;  Laterality: Left;  PT  . REVISON OF ARTERIOVENOUS FISTULA Left 01/02/2020   Procedure: REVISON OF LEFT ARM ARTERIOVENOUS FISTULA;  Surgeon: Elam Dutch, MD;  Location: [place];  Service: Vascular;  Laterality: Left;    Allergies  Allergen Reactions  . Occlusive Silicone Sheets [Silicone] Other (See Comments)    "Allergic," per MAR  . Other Other (See Comments)    Unknown reaction to Occlusive adhesive- "Allergic," per MAR  . Sulfa Antibiotics   . Tape Itching and Other (See Comments)    Use Cloth tape only, please Use Cloth tape only, please    Allergies as of 07/08/2020      Reactions   Occlusive Silicone Sheets [silicone] Other (See Comments)   "Allergic," per [facility]   Other Other (See Comments)   Unknown reaction to Occlusive adhesive- "Allergic," per MAR   Sulfa Antibiotics    Tape Itching, Other (See Comments)   Use Cloth tape only, please Use Cloth tape only, please      Medication List       Accurate as of July 08, 2020  2:30 PM. If you have any questions, ask your nurse or  doctor.        acetaminophen 500 MG tablet Commonly known as: TYLENOL Take 1,000 mg by mouth 2 (two) times daily.   ADULT NUTRITIONAL SUPPLEMENT PO Take 1 each by mouth daily with supper. Magic Cup with dinner   NEPRO PO Take 1 Can by mouth 2 (two) times daily.   atorvastatin 20 MG tablet Commonly known as: LIPITOR Take 20 mg by mouth daily.   dicyclomine 20 MG tablet Commonly known as: BENTYL Take 20 mg by mouth 3 (three) times daily before meals.   divalproex 125 MG capsule Commonly known as: DEPAKOTE SPRINKLE Take 250 mg by mouth at bedtime.   feeding supplement (PRO-STAT SUGAR FREE 64) Liqd Take 30 mLs by mouth 2 (two) times daily. 9a and 5p   Hectorol 4 MCG/2ML injection Generic drug: doxercalciferol Inject 2 mcg into the vein every Monday, Wednesday, and Friday with hemodialysis.   HYDROcodone-acetaminophen 5-325 MG tablet Commonly  known as: NORCO/VICODIN Take 1 tablet by mouth 2 (two) times daily. For Pain   loperamide 2 MG tablet Commonly known as: IMODIUM A-D Take 4 mg by mouth See admin instructions. Take 2 tablets ( 4 mg) by mouth after 1st loose stool as needed for diarrhea   metoprolol tartrate 25 MG tablet Commonly known as: LOPRESSOR Give 1 tablet by mouth at bedtime on Mon Wed ,Fri   metoprolol tartrate 25 MG tablet Commonly known as: LOPRESSOR Give 1 tablet by mouth twice daily on Tues ,Thur ,Sat ,Sun   midodrine 10 MG tablet Commonly known as: PROAMATINE Take one tablet (10 mg) by mouth on Monday, Wednesday, Friday 30 minutes prior to dialysis.   multivitamin Tabs tablet Take 1 tablet by mouth at bedtime.   NON FORMULARY DIET :Regular NAS Diet with Double meat Portion,1200 ml fluid restriction. Diet liberalized to promote PO Intake.   pantoprazole 40 MG tablet Commonly known as: PROTONIX Take 1 tablet (40 mg total) by mouth daily.   sevelamer carbonate 800 MG tablet Commonly known as: RENVELA Take 1,600 mg by mouth 3 (three) times  daily with meals.   Vitamin D3 1.25 MG (50000 UT) Caps Take 50,000 Units by mouth every Friday.   warfarin 3 MG tablet Commonly known as: COUMADIN Take 3 mg tablet with 0.5 mg to = 3.5 mg tues. Thurs, sat, sun   warfarin 1 MG tablet Commonly known as: COUMADIN Take 0.5 mg by mouth. Take 0.5 along with a 3 mg tablet to = 3.5 mg on Tues, Thur, Sat, Sun   warfarin 4 MG tablet Commonly known as: COUMADIN Take 4 mg by mouth every Monday, Wednesday, and Friday.       Review of Systems  Constitutional: Negative for appetite change, chills, fatigue and fever.  HENT: Negative for congestion, nosebleeds, rhinorrhea, sinus pressure, sinus pain, sneezing and sore throat.   Respiratory: Negative for cough, chest tightness, shortness of breath and wheezing.   Cardiovascular: Negative for chest pain and palpitations.       Bilateral AKA   Gastrointestinal: Negative for abdominal distention, abdominal pain, blood in stool, constipation, diarrhea, nausea and vomiting.  Genitourinary:       On dialysis M-W_F  Skin: Negative for color change, pallor and rash.  Neurological: Negative for dizziness, speech difficulty, light-headedness and headaches.  Hematological: Does not bruise/bleed easily.    Immunization History  Administered Date(s) Administered  . Influenza-Unspecified 08/29/2019  . Moderna SARS-COVID-2 Vaccination 12/28/2019, 01/25/2020  . Pneumococcal Polysaccharide-23 10/07/2019   Pertinent  Health Maintenance Due  Topic Date Due  . HEMOGLOBIN A1C  07/19/2020  . INFLUENZA VACCINE  07/28/2020  . PNA vac Low Risk Adult (2 of 2 - PCV13) 10/06/2020  . OPHTHALMOLOGY EXAM  12/18/2020  . FOOT EXAM  Discontinued  . URINE MICROALBUMIN  Discontinued   No flowsheet data found.   Vitals:   07/08/20 1417  BP: 109/66  Pulse: 60  Resp: 16  Temp: (!) 97.3 F (36.3 C)  SpO2: 95%  Weight: 118 lb (53.5 kg)  Height: 3' (0.914 m)   Body mass index is 64.01 kg/m. Physical  Exam Vitals reviewed.  Constitutional:      General: He is not in acute distress.    Appearance: He is not ill-appearing.  HENT:     Mouth/Throat:     Mouth: Mucous membranes are moist.     Pharynx: Oropharynx is clear. No oropharyngeal exudate or posterior oropharyngeal erythema.  Eyes:     General:  No scleral icterus.       Right eye: No discharge.        Left eye: No discharge.     Conjunctiva/sclera: Conjunctivae normal.     Pupils: Pupils are equal, round, and reactive to light.  Cardiovascular:     Rate and Rhythm: Normal rate and regular rhythm.     Heart sounds: Murmur heard.  No friction rub. No gallop.      Comments: Bilateral AKA Pulmonary:     Effort: Pulmonary effort is normal. No respiratory distress.     Breath sounds: Normal breath sounds. No wheezing, rhonchi or rales.  Chest:     Chest wall: No tenderness.  Abdominal:     General: Bowel sounds are normal. There is no distension.     Palpations: Abdomen is soft. There is no mass.     Tenderness: There is no abdominal tenderness. There is no right CVA tenderness, left CVA tenderness, guarding or rebound.  Musculoskeletal:        General: No swelling or tenderness.     Comments: Self propels on wheelchair.bilateral AKA   Skin:    General: Skin is warm.     Coloration: Skin is not pale.     Findings: No bruising, erythema or rash.  Neurological:     Mental Status: He is oriented to person, place, and time.     Cranial Nerves: No cranial nerve deficit.     Motor: No weakness.  Psychiatric:        Mood and Affect: Mood normal.        Behavior: Behavior normal.        Thought Content: Thought content normal.        Judgment: Judgment normal.     Labs reviewed: Recent Labs    07/27/19 1529 07/27/19 1529 08/16/19 1818 08/16/19 1818 01/02/20 0841 01/03/20 1106 07/03/20 2040  NA 138   < > 136   < > 140 138 142  K 3.5   < > 4.0   < > 3.6 3.7 3.7  CL 95*   < > 97*   < > 95* 101 96*  CO2 26  --  25  --    --   --  31  GLUCOSE 143*   < > 75   < > 120* 102* 118*  BUN 48*   < > 28*   < > 28* 38* 51*  CREATININE 4.61*   < > 3.59*   < > 3.80* 5.90* 5.17*  CALCIUM 10.2  --  9.5  --   --   --  9.5   < > = values in this interval not displayed.   Recent Labs    08/16/19 1818  AST 54*  ALT 38  ALKPHOS 229*  BILITOT 1.2  PROT 7.8  ALBUMIN 2.9*   Recent Labs    07/27/19 1529 07/27/19 1529 08/16/19 1818 08/16/19 1818 01/02/20 0841 01/03/20 1106 07/03/20 2040  WBC 18.6*  --  6.9  --   --   --  6.9  NEUTROABS 15.5*  --  4.4  --   --   --  4.2  HGB 12.4*   < > 11.0*   < > 11.9* 10.2* 11.2*  HCT 40.2   < > 34.8*   < > 35.0* 30.0* 35.1*  MCV 89.3  --  89.5  --   --   --  103.5*  PLT 171  --  193  --   --   --  133*   < > = values in this interval not displayed.   Lab Results  Component Value Date   TSH 1.839 05/25/2019   Lab Results  Component Value Date   HGBA1C 3.9 01/20/2020   Lab Results  Component Value Date   CHOL 113 11/11/2018   HDL 39 (L) 11/11/2018   LDLCALC 62 11/11/2018   TRIG 60 11/11/2018   CHOLHDL 2.9 11/11/2018    Significant Diagnostic Results in last 30 days:  No results found.  Assessment/Plan 1. Anemia due to end stage renal disease (HCC) Hgb 10.9 previous 11.2 no signs of bleeding. Will monitor CBC   2. Hyperlipidemia associated with type 2 diabetes mellitus (HCC) Total cholesterol,TRG and LDL at goal. - decrease Lipitor from 20 to 10 mg tablet daily   3. PAF (paroxysmal atrial fibrillation) (HCC) On coumadin 3.5 mg tablet on TTHSS and 4 mg tablet on M-W_Fri - increase coumadin to 4 mg tablet daily. - recheck INR 07/12/2020    Family/ staff Communication: Plan of care reviewed with patient and facility Nurse   Labs/tests ordered: INR 07/12/2020

## 2020-08-26 ENCOUNTER — Other Ambulatory Visit: Payer: Self-pay | Admitting: Family

## 2020-08-26 MED ORDER — HYDROCODONE-ACETAMINOPHEN 5-325 MG PO TABS: 1.0000 | ORAL_TABLET | Freq: Two times a day (BID) | ORAL | 0 refills | Status: DC

## 2020-08-26 NOTE — Progress Notes (Signed)
Hydrocodone-APAP 5/325 mg tablet refill requested.Script send to pharmacy.

## 2020-09-04 ENCOUNTER — Non-Acute Institutional Stay (SKILLED_NURSING_FACILITY): Payer: Medicare Other | Admitting: Family

## 2020-09-04 ENCOUNTER — Encounter: Payer: Self-pay | Admitting: Family

## 2020-09-04 DIAGNOSIS — I480 Paroxysmal atrial fibrillation: Secondary | ICD-10-CM | POA: Diagnosis not present

## 2020-09-04 DIAGNOSIS — W19XXXA Unspecified fall, initial encounter: Secondary | ICD-10-CM | POA: Diagnosis not present

## 2020-09-04 DIAGNOSIS — I132 Hypertensive heart and chronic kidney disease with heart failure and with stage 5 chronic kidney disease, or end stage renal disease: Secondary | ICD-10-CM

## 2020-09-04 DIAGNOSIS — Y92129 Unspecified place in nursing home as the place of occurrence of the external cause: Secondary | ICD-10-CM | POA: Diagnosis not present

## 2020-09-04 DIAGNOSIS — Z992 Dependence on renal dialysis: Secondary | ICD-10-CM

## 2020-09-04 DIAGNOSIS — N186 End stage renal disease: Secondary | ICD-10-CM

## 2020-09-04 DIAGNOSIS — I5032 Chronic diastolic (congestive) heart failure: Secondary | ICD-10-CM

## 2020-09-04 NOTE — Progress Notes (Signed)
Location:    [facility].   Nursing Home Room Number: 414-D Place of Service:  SNF (31) Provider:  Marlowe Sax, NP    Patient Care Team: Gayland Curry, DO as PCP - General (Geriatric Medicine) [name], MD as Consulting Physician (Nephrology) Rehab, [facility], [facility]  Extended Emergency Contact Information Primary Emergency Contact: [name],[name] Address: [place], [place] [address] [name] of [place] Phone: [phone] Work Phone: [phone] Mobile Phone: [phone] Relation: Daughter Secondary Emergency Contact: [place] Phone: [phone] Mobile Phone: [phone] Relation: Other  Code Status:  DNR Goals of care: Advanced Directive information Advanced Directives 09/04/2020  Does Patient Have a Medical Advance Directive? Yes  Type of Advance Directive Out of facility DNR (pink MOST or yellow form);Healthcare Power of Attorney  Does patient want to make changes to medical advance directive? No - Patient declined  Copy of [place] in Chart? Yes - validated most recent copy scanned in chart (See row information)  Would patient like information on creating a medical advance directive? -  Pre-existing out of facility DNR order (yellow form or pink MOST form) -     Chief Complaint  Patient presents with   Acute Visit    Unwittnessed Fall    HPI:  Pt is a 79 y.o. male seen today for an acute visit for evaluation of unwitnessed fall.Facility Nurse reports patient was observed sitting on the floor next to his wheelchair on 09/03/2020.He states was trying to get back to bed.He denies hitting his head.No injuries sustained. He denies any fever,chills or acute illness. His neuro checks and vital signs have been stable.     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-   not on medication    ESRD on hemodialysis ([place])    Started dialysis in 2009   History of GI bleed    secondary to coumadin   HTN (hypertension)    Hyperlipidemia    OSA (obstructive sleep apnea)    uses CPAP   Sacral wound    stage II - healed, has a protective dressing on- 12/28/2019   Secondary hyperparathyroidism of renal origin [facility])    Past Surgical History:  Procedure Laterality Date   ABDOMINAL AORTOGRAM N/A 11/15/2018   Procedure: ABDOMINAL AORTOGRAM;  Surgeon: [name], MD;  Location: [place] [facility];  Service: Cardiovascular;  Laterality: N/A;   AMPUTATION Left 12/06/2018   Procedure: AMPUTATION DIGIT LEFT FIFTH TOE;  Surgeon: Angelia Mould, MD;  Location: [place];  Service: Vascular;  Laterality: Left;   AMPUTATION Bilateral 12/29/2018   Procedure: AMPUTATION ABOVE KNEE;  Surgeon: Waynetta Sandy, MD;  Location: [place];  Service: Vascular;  Laterality: Bilateral;   APPLICATION OF WOUND VAC Right 02/17/2019   Procedure: Application Of Wound Vac;  Surgeon: Marty Heck, MD;  Location: [place];  Service: Vascular;  Laterality: Right;   BVT  6/97/94   Left  Basilic Vein Transposition   CHOLECYSTECTOMY     EYE SURGERY     Catarct bil   I & D EXTREMITY Right 02/17/2019  Procedure: Right above the kneee debridement;  Surgeon: Marty Heck, MD;  Location: [place];  Service: Vascular;  Laterality: Right;   INSERTION OF DIALYSIS CATHETER  05/28/2012   Procedure: INSERTION OF DIALYSIS CATHETER;  Surgeon: Mal Misty, MD;  Location: [place];  Service: Vascular;  Laterality: Right;   INSERTION OF DIALYSIS CATHETER Right 01/02/2020   Procedure: INSERTION OF DIALYSIS CATHETER RIGHT INTERNAL JUGULAR;  Surgeon: Elam Dutch, MD;  Location: [place];  Service: Vascular;  Laterality: Right;    INSERTION OF DIALYSIS CATHETER Right 01/03/2020   Procedure: INSERTION OF DIALYSIS CATHETER RIGHT INTERNAL JUGULAR;  Surgeon: Rosetta Posner, MD;  [facility];  Service: Vascular;  Laterality: Right;   Left arm shuntogram.     Left forearm loop graft with 6 mm Gore-Tex graft.     LOWER EXTREMITY ANGIOGRAPHY Bilateral 11/15/2018   Procedure: LOWER EXTREMITY ANGIOGRAPHY;  Surgeon: [name], MD;  Location: [place] [facility];  Service: Cardiovascular;  Laterality: Bilateral;   Pars plana vitrectomy with 25-gauge system     PERIPHERAL VASCULAR ATHERECTOMY Left 11/15/2018   Procedure: PERIPHERAL VASCULAR ATHERECTOMY;  Surgeon: [name], MD;  Location: [place] [facility];  Service: Cardiovascular;  Laterality: Left;  SFA with STENT   PERIPHERAL VASCULAR BALLOON ANGIOPLASTY Left 11/15/2018   Procedure: PERIPHERAL VASCULAR BALLOON ANGIOPLASTY;  Surgeon: [name], MD;  Location: [place] [facility];  Service: Cardiovascular;  Laterality: Left;  PT   REVISON OF ARTERIOVENOUS FISTULA Left 01/02/2020   Procedure: REVISON OF LEFT ARM ARTERIOVENOUS FISTULA;  Surgeon: Elam Dutch, MD;  Location: [place];  Service: Vascular;  Laterality: Left;    Allergies  Allergen Reactions   Occlusive Silicone Sheets [Silicone] Other (See Comments)    "Allergic," per [facility]   Other Other (See Comments)    Unknown reaction to Occlusive adhesive- "Allergic," per [facility]   Sulfa Antibiotics    Tape Itching and Other (See Comments)    Use Cloth tape only, please Use Cloth tape only, please    Allergies as of 09/04/2020      Reactions   Occlusive Silicone Sheets [silicone] Other (See Comments)   "Allergic," per [facility]   Other Other (See Comments)   Unknown reaction to Occlusive adhesive- "Allergic," per [facility]   Sulfa Antibiotics    Tape Itching, Other (See Comments)   Use Cloth tape only, please Use Cloth tape only, please      Medication List       Accurate as of September 04, 2020   2:48 PM. If you have any questions, ask your nurse or doctor.        acetaminophen 500 MG tablet Commonly known as: TYLENOL Take 1,000 mg by mouth 2 (two) times daily.   atorvastatin 10 MG tablet Commonly known as: LIPITOR Take 10 mg by mouth daily. What changed: Another medication with the same name was removed. Continue taking this medication, and follow the directions you see here. Changed by: Sandrea Hughs, NP   dicyclomine 20 MG tablet Commonly known as: BENTYL Take 20 mg by mouth 3 (three) times daily before meals.   divalproex 125 MG capsule Commonly known as: DEPAKOTE SPRINKLE Take 250 mg by mouth at bedtime.   feeding supplement (PRO-STAT SUGAR FREE 64) Liqd Take 30 mLs by mouth 2 (two) times daily. 9a and 5p   Hectorol 4 MCG/2ML injection Generic drug: doxercalciferol Inject 2 mcg into the vein every Monday, Wednesday, and Friday with hemodialysis.  HYDROcodone-acetaminophen 5-325 MG tablet Commonly known as: NORCO/VICODIN Take 1 tablet by mouth 2 (two) times daily. For Pain   loperamide 2 MG tablet Commonly known as: IMODIUM A-D Take 4 mg by mouth See admin instructions. Take 2 tablets ( 4 mg) by mouth after 1st loose stool as needed for diarrhea   metoprolol tartrate 25 MG tablet Commonly known as: LOPRESSOR Give 1 tablet by mouth at bedtime on Mon Wed ,Fri   metoprolol tartrate 25 MG tablet Commonly known as: LOPRESSOR Give 1 tablet by mouth twice daily on Tues ,Thur ,Sat ,Sun   midodrine 10 MG tablet Commonly known as: PROAMATINE Take one tablet (10 mg) by mouth on Monday, Wednesday, Friday 30 minutes prior to dialysis.   multivitamin Tabs tablet Take 1 tablet by mouth at bedtime.   NEPRO PO Take 1 Can by mouth 2 (two) times daily. What changed: Another medication with the same name was removed. Continue taking this medication, and follow the directions you see here. Changed by: Sandrea Hughs, NP   NON FORMULARY DIET :Regular NAS Diet with  Double meat Portion,1200 ml fluid restriction. Diet liberalized to promote PO Intake.   pantoprazole 40 MG tablet Commonly known as: PROTONIX Take 1 tablet (40 mg total) by mouth daily.   sevelamer carbonate 800 MG tablet Commonly known as: RENVELA Take 1,600 mg by mouth 3 (three) times daily with meals.   Vitamin D3 1.25 MG (50000 UT) Caps Take 50,000 Units by mouth every Friday.   warfarin 4 MG tablet Commonly known as: COUMADIN Take 4 mg by mouth daily. What changed: Another medication with the same name was removed. Continue taking this medication, and follow the directions you see here. Changed by: Sandrea Hughs, NP       Review of Systems  Constitutional: Negative for appetite change, chills, fatigue and fever.  HENT: Negative for congestion, rhinorrhea, sinus pressure, sinus pain, sneezing and sore throat.   Respiratory: Negative for cough, chest tightness, shortness of breath and wheezing.   Cardiovascular: Negative for chest pain and palpitations.       Bilateral AKA  Gastrointestinal: Negative for abdominal distention, abdominal pain, constipation, diarrhea, nausea and vomiting.  Genitourinary:       On dialysis M-W-F  Musculoskeletal: Negative for arthralgias, back pain, joint swelling and myalgias.  Skin: Negative for color change, pallor and rash.  Neurological: Negative for dizziness, speech difficulty, weakness, light-headedness and headaches.  Hematological: Does not bruise/bleed easily.  Psychiatric/Behavioral: Negative for agitation and sleep disturbance. The patient is not nervous/anxious.     Immunization History  Administered Date(s) Administered   Influenza-Unspecified 08/29/2019   Moderna SARS-COVID-2 Vaccination 12/28/2019, 01/25/2020   Pneumococcal Polysaccharide-23 10/07/2019   Pertinent  Health Maintenance Due  Topic Date Due   HEMOGLOBIN A1C  07/19/2020   INFLUENZA VACCINE  07/28/2020   PNA vac Low Risk Adult (2 of 2 - PCV13)  10/06/2020   OPHTHALMOLOGY EXAM  12/18/2020   FOOT EXAM  Discontinued   URINE MICROALBUMIN  Discontinued   No flowsheet data found.     Vitals:   09/04/20 1424  BP: 126/60  Pulse: 62  Resp: 18  Temp: (!) 96.8 F (36 C)  Weight: 123 lb 8 oz (56 kg)  Height: 3' (0.914 m)   Body mass index is 67 kg/m. Physical Exam Vitals and nursing note reviewed.  Constitutional:      General: He is not in acute distress.    Appearance: He is not ill-appearing.  HENT:  Head: Normocephalic.  Eyes:     General: No scleral icterus.       Right eye: No discharge.        Left eye: No discharge.     Conjunctiva/sclera: Conjunctivae normal.     Pupils: Pupils are equal, round, and reactive to light.  Cardiovascular:     Rate and Rhythm: Normal rate and regular rhythm.     Pulses: Normal pulses.     Heart sounds: Murmur heard.  No friction rub. No gallop.      Comments: Left arm AV fistula positive for bruit and thrill.no signs of infection. Pulmonary:     Effort: Pulmonary effort is normal. No respiratory distress.     Breath sounds: Normal breath sounds. No wheezing, rhonchi or rales.  Chest:     Chest wall: No tenderness.  Abdominal:     General: Bowel sounds are normal. There is no distension.     Palpations: Abdomen is soft. There is no mass.     Tenderness: There is no abdominal tenderness. There is no right CVA tenderness, left CVA tenderness, guarding or rebound.  Musculoskeletal:        General: No swelling or tenderness.     Comments: Bilateral AKA   Skin:    General: Skin is warm.     Coloration: Skin is not pale.     Findings: No bruising or erythema.  Neurological:     Mental Status: He is alert. Mental status is at baseline.     Motor: No weakness.     Coordination: Coordination normal.  Psychiatric:        Mood and Affect: Mood normal.        Behavior: Behavior normal.        Thought Content: Thought content normal.        Judgment: Judgment normal.     Labs reviewed: Recent Labs    01/02/20 0841 01/02/20 0841 01/03/20 1106 07/03/20 2040 07/05/20 0000  NA 140   < > 138 142 145  K 3.6   < > 3.7 3.7 4.2  CL 95*   < > 101 96* 99  CO2  --   --   --  31 29*  GLUCOSE 120*  --  102* 118*  --   BUN 28*   < > 38* 51* 49*  CREATININE 3.80*   < > 5.90* 5.17* 4.2*  CALCIUM  --   --   --  9.5 10.0   < > = values in this interval not displayed.   Recent Labs    07/05/20 0000  AST 23  ALT 20  ALBUMIN 4.2   Recent Labs    01/03/20 1106 07/03/20 2040 07/05/20 0000  WBC  --  6.9 6.5  NEUTROABS  --  4.2  --   HGB 10.2* 11.2* 10.9*  HCT 30.0* 35.1* 33*  MCV  --  103.5*  --   PLT  --  133* 135*   Lab Results  Component Value Date   TSH 1.839 05/25/2019   Lab Results  Component Value Date   HGBA1C 3.9 01/20/2020   Lab Results  Component Value Date   CHOL 97 07/05/2020   HDL 38 07/05/2020   LDLCALC 40 07/05/2020   TRIG 96 07/05/2020   CHOLHDL 2.9 11/11/2018    Significant Diagnostic Results in last 30 days:  No results found.  Assessment/Plan 1. Fall at nursing home, initial encounter Had unwitnessed fall trying to transfer self from wheelchair  to bed.Onserved by staff sitting on the floor.No injuries noted.reports no acute issues or pain.Encouraged to use call button to call for assistance. - vital signs and Neuro checks stable.  - continue on fall and safety precaution.   2. PAF (paroxysmal atrial fibrillation) ([place]) - continue on coumadin 4 mg tablet daily and Metoprolol. - continue to monitor INR   3. 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 Continue on Metoprolol   Family/ staff Communication: Reviewed plan with patient and facility Nurse   Labs/tests ordered:  None

## 2020-09-11 LAB — LIPID PANEL
Cholesterol: 107 (ref 0–200)
HDL: 37 (ref 35–70)
LDL Cholesterol: 43
Triglycerides: 135 (ref 40–160)

## 2020-09-12 ENCOUNTER — Non-Acute Institutional Stay (SKILLED_NURSING_FACILITY): Payer: Medicare Other | Admitting: Family

## 2020-09-12 ENCOUNTER — Encounter: Payer: Self-pay | Admitting: Family

## 2020-09-12 DIAGNOSIS — I480 Paroxysmal atrial fibrillation: Secondary | ICD-10-CM | POA: Diagnosis not present

## 2020-09-12 DIAGNOSIS — N186 End stage renal disease: Secondary | ICD-10-CM

## 2020-09-12 DIAGNOSIS — E785 Hyperlipidemia, unspecified: Secondary | ICD-10-CM

## 2020-09-12 DIAGNOSIS — I5032 Chronic diastolic (congestive) heart failure: Secondary | ICD-10-CM | POA: Diagnosis not present

## 2020-09-12 DIAGNOSIS — N471 Phimosis: Secondary | ICD-10-CM

## 2020-09-12 DIAGNOSIS — Z992 Dependence on renal dialysis: Secondary | ICD-10-CM

## 2020-09-12 DIAGNOSIS — E1169 Type 2 diabetes mellitus with other specified complication: Secondary | ICD-10-CM

## 2020-09-12 DIAGNOSIS — I132 Hypertensive heart and chronic kidney disease with heart failure and with stage 5 chronic kidney disease, or end stage renal disease: Secondary | ICD-10-CM | POA: Diagnosis not present

## 2020-09-12 NOTE — Progress Notes (Deleted)
Location:    [place].   Nursing Home Room Number: 414-D Place of Service:  SNF (31) Provider:  Marlowe Sax, NP    Patient Care Team: Gayland Curry, DO as PCP - General (Geriatric Medicine) [name], MD as Consulting Physician (Nephrology) Rehab, [facility], [facility]  Extended Emergency Contact Information Primary Emergency Contact: [name],[name] Address: [place], [place] [address] [name] of [place] Phone: [phone] Work Phone: [phone] Mobile Phone: [phone] Relation: Daughter Secondary Emergency Contact: [place] Phone: [phone] Mobile Phone: [phone] Relation: Other  Code Status:  DNR Goals of care: Advanced Directive information Advanced Directives 09/12/2020  Does Patient Have a Medical Advance Directive? Yes  Type of Advance Directive Out of facility DNR (pink MOST or yellow form);Healthcare Power of Attorney  Does patient want to make changes to medical advance directive? No - Patient declined  Copy of [place] in Chart? Yes - validated most recent copy scanned in chart (See row information)  Would patient like information on creating a medical advance directive? -  Pre-existing out of facility DNR order (yellow form or pink MOST form) -     Chief Complaint  Patient presents with  . Medical Management of Chronic Issues    Routine Visit.  Marland Kitchen Health Maintenance    Discuss the need for Hepatitis C Screening, and Hemoglobin A1C  . Immunizations    Discuss the need for Tetanus Vaccine, and Influenza Vaccine.    HPI:  Pt is a 79 y.o. male seen today for medical management of chronic diseases. He has a medical history of Hypertension,CHF,Hyperlipidemia,controlled Type 2 DM,Afib on coumadin,ESRD on dialysis ,BPH,Anemia among other condition. He is seen in bed with facility Nurse and Nurse assistant present at  bedside.NA states unable to retract patient's foreskin for peri-care also has soreness on glans area No drainage noted.He denies any fever or chills.  Past Medical History:  Diagnosis Date  . A-fib ([place])   . Anemia   . Aortic stenosis   . Blood transfusion   . BPH (benign prostatic hyperplasia)   . CHF (congestive heart failure) ([place])   . Diarrhea   . 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 ESRD on hemodialysis ([place])    Started dialysis in 2009  . History of GI bleed    secondary to coumadin  . HTN (hypertension)   . Hyperlipidemia   . OSA (obstructive sleep apnea)    uses CPAP  . Sacral wound    stage II - healed, has a protective dressing on- 12/28/2019  . Secondary hyperparathyroidism of renal origin [facility])    Past Surgical History:  Procedure Laterality Date  . ABDOMINAL AORTOGRAM N/A 11/15/2018   Procedure: ABDOMINAL AORTOGRAM;  Surgeon: [name], MD;  Location: [place] [facility];  Service: Cardiovascular;  Laterality: N/A;  . AMPUTATION Left 12/06/2018   Procedure: AMPUTATION DIGIT LEFT FIFTH TOE;  Surgeon: Angelia Mould, MD;  Location: [place];  Service: Vascular;  Laterality: Left;  . AMPUTATION Bilateral 12/29/2018   Procedure: AMPUTATION ABOVE KNEE;  Surgeon: Waynetta Sandy, MD;  Location: [place];  Service: Vascular;  Laterality: Bilateral;  . APPLICATION OF WOUND VAC Right 02/17/2019   Procedure: Application Of Wound Vac;  Surgeon: Marty Heck, MD;  Location: [place];  Service: Vascular;  Laterality:  Right;  Marland Kitchen BVT  2/42/35   Left  Basilic Vein Transposition  . CHOLECYSTECTOMY    . EYE SURGERY     Catarct bil  . I & D EXTREMITY Right 02/17/2019   Procedure: Right above the kneee debridement;  Surgeon: Marty Heck, MD;  Location: [place];  Service: Vascular;  Laterality: Right;  . INSERTION OF DIALYSIS CATHETER  05/28/2012   Procedure: INSERTION OF DIALYSIS CATHETER;  Surgeon: Mal Misty, MD;  Location:  [place];  Service: Vascular;  Laterality: Right;  . INSERTION OF DIALYSIS CATHETER Right 01/02/2020   Procedure: INSERTION OF DIALYSIS CATHETER RIGHT INTERNAL JUGULAR;  Surgeon: Elam Dutch, MD;  Location: [place];  Service: Vascular;  Laterality: Right;  . INSERTION OF DIALYSIS CATHETER Right 01/03/2020   Procedure: INSERTION OF DIALYSIS CATHETER RIGHT INTERNAL JUGULAR;  Surgeon: Rosetta Posner, MD;  Location: [place];  Service: Vascular;  Laterality: Right;  . Left arm shuntogram.    . Left forearm loop graft with 6 mm Gore-Tex graft.    . LOWER EXTREMITY ANGIOGRAPHY Bilateral 11/15/2018   Procedure: LOWER EXTREMITY ANGIOGRAPHY;  Surgeon: [name], MD;  Location: [place] [facility];  Service: Cardiovascular;  Laterality: Bilateral;  . Pars plana vitrectomy with 25-gauge system    . PERIPHERAL VASCULAR ATHERECTOMY Left 11/15/2018   Procedure: PERIPHERAL VASCULAR ATHERECTOMY;  Surgeon: [name], MD;  Location: [place] [facility];  Service: Cardiovascular;  Laterality: Left;  SFA with STENT  . PERIPHERAL VASCULAR BALLOON ANGIOPLASTY Left 11/15/2018   Procedure: PERIPHERAL VASCULAR BALLOON ANGIOPLASTY;  Surgeon: [name], MD;  Location: [place] [facility];  Service: Cardiovascular;  Laterality: Left;  PT  . REVISON OF ARTERIOVENOUS FISTULA Left 01/02/2020   Procedure: REVISON OF LEFT ARM ARTERIOVENOUS FISTULA;  Surgeon: Elam Dutch, MD;  Location: [place];  Service: Vascular;  Laterality: Left;    Allergies  Allergen Reactions  . Occlusive Silicone Sheets [Silicone] Other (See Comments)    "Allergic," per [facility]  . Other Other (See Comments)    Unknown reaction to Occlusive adhesive- "Allergic," per [facility]  . Sulfa Antibiotics   . Tape Itching and Other (See Comments)    Use Cloth tape only, please Use Cloth tape only, please    Allergies as of 09/12/2020      Reactions   Occlusive Silicone Sheets [silicone] Other (See Comments)   "Allergic," per [facility]   Other Other (See  Comments)   Unknown reaction to Occlusive adhesive- "Allergic," per [facility]   Sulfa Antibiotics    Tape Itching, Other (See Comments)   Use Cloth tape only, please Use Cloth tape only, please      Medication List       Accurate as of September 12, 2020 12:45 PM. If you have any questions, ask your nurse or doctor.        acetaminophen 500 MG tablet Commonly known as: TYLENOL Take 1,000 mg by mouth 2 (two) times daily.   atorvastatin 10 MG tablet Commonly known as: LIPITOR Take 10 mg by mouth daily.   dicyclomine 20 MG tablet Commonly known as: BENTYL Take 20 mg by mouth 3 (three) times daily before meals.   divalproex 250 MG DR tablet Commonly known as: DEPAKOTE Take 250 mg by mouth daily. What changed: Another medication with the same name was removed. Continue taking this medication, and follow the directions you see here. Changed by: Sandrea Hughs, NP   feeding supplement (PRO-STAT SUGAR FREE  64) Liqd Take 30 mLs by mouth 2 (two) times daily. 9a and 5p   Hectorol 4 MCG/2ML injection Generic drug: doxercalciferol Inject 2 mcg into the vein every Monday, Wednesday, and Friday with hemodialysis.   HYDROcodone-acetaminophen 5-325 MG tablet Commonly known as: NORCO/VICODIN Take 1 tablet by mouth 2 (two) times daily. For Pain   loperamide 2 MG tablet Commonly known as: IMODIUM A-D Take 4 mg by mouth See admin instructions. Take 2 tablets ( 4 mg) by mouth after 1st loose stool as needed for diarrhea   metoprolol tartrate 25 MG tablet Commonly known as: LOPRESSOR Give 1 tablet by mouth at bedtime on Mon Wed ,Fri   metoprolol tartrate 25 MG tablet Commonly known as: LOPRESSOR Give 1 tablet by mouth twice daily on Tues ,Thur ,Sat ,Sun   midodrine 10 MG tablet Commonly known as: PROAMATINE Take one tablet (10 mg) by mouth on Monday, Wednesday, Friday 30 minutes prior to dialysis.   multivitamin Tabs tablet Take 1 tablet by mouth at bedtime.   NEPRO PO Take 1 Can  by mouth 2 (two) times daily.   NON FORMULARY DIET :Regular NAS Diet with Double meat Portion,1200 ml fluid restriction. Diet liberalized to promote PO Intake.   pantoprazole 40 MG tablet Commonly known as: PROTONIX Take 1 tablet (40 mg total) by mouth daily.   sevelamer carbonate 800 MG tablet Commonly known as: RENVELA Take 1,600 mg by mouth 3 (three) times daily with meals.   Vitamin D3 1.25 MG (50000 UT) Caps Take 50,000 Units by mouth every Friday.   warfarin 4 MG tablet Commonly known as: COUMADIN Take 4 mg by mouth daily.       Review of Systems  Immunization History  Administered Date(s) Administered  . Influenza-Unspecified 08/29/2019  . Moderna SARS-COVID-2 Vaccination 12/28/2019, 01/25/2020  . Pneumococcal Polysaccharide-23 10/07/2019   Pertinent  Health Maintenance Due  Topic Date Due  . HEMOGLOBIN A1C  07/19/2020  . INFLUENZA VACCINE  07/28/2020  . PNA vac Low Risk Adult (2 of 2 - PCV13) 10/06/2020  . OPHTHALMOLOGY EXAM  12/18/2020  . FOOT EXAM  Discontinued  . URINE MICROALBUMIN  Discontinued   No flowsheet data found. Functional Status Survey:    Vitals:   09/12/20 1239  BP: 93/60  Pulse: 74  Resp: 18  Temp: (!) 97.3 F (36.3 C)  SpO2: 95%  Weight: 123 lb 8 oz (56 kg)  Height: 3' (0.914 m)   Body mass index is 67 kg/m. Physical Exam  Labs reviewed: Recent Labs    01/02/20 0841 01/02/20 0841 01/03/20 1106 07/03/20 2040 07/05/20 0000  NA 140   < > 138 142 145  K 3.6   < > 3.7 3.7 4.2  CL 95*   < > 101 96* 99  CO2  --   --   --  31 29*  GLUCOSE 120*  --  102* 118*  --   BUN 28*   < > 38* 51* 49*  CREATININE 3.80*   < > 5.90* 5.17* 4.2*  CALCIUM  --   --   --  9.5 10.0   < > = values in this interval not displayed.   Recent Labs    07/05/20 0000  AST 23  ALT 20  ALBUMIN 4.2   Recent Labs    01/03/20 1106 07/03/20 2040 07/05/20 0000  WBC  --  6.9 6.5  NEUTROABS  --  4.2  --   HGB 10.2* 11.2* 10.9*  HCT 30.0* 35.1*  33*  MCV  --  103.5*  --   PLT  --  133* 135*   Lab Results  Component Value Date   TSH 1.839 05/25/2019   Lab Results  Component Value Date   HGBA1C 3.9 01/20/2020   Lab Results  Component Value Date   CHOL 107 09/11/2020   HDL 37 09/11/2020   LDLCALC 43 09/11/2020   TRIG 135 09/11/2020   CHOLHDL 2.9 11/11/2018    Significant Diagnostic Results in last 30 days:  No results found.  Assessment/Plan There are no diagnoses linked to this encounter.   Family/ staff Communication:   Labs/tests ordered:

## 2020-09-12 NOTE — Progress Notes (Signed)
Location:    [place].   Nursing Home Room Number: 414-D Place of Service:  SNF (31) Provider:  Marlowe Sax, NP    Patient Care Team: Gayland Curry, DO as PCP - General (Geriatric Medicine) [name], MD as Consulting Physician (Nephrology) Rehab, [facility], [facility]  Extended Emergency Contact Information Primary Emergency Contact: [name],[name] Address: [place], [place] [address] [name] of [place] Phone: [phone] Work Phone: [phone] Mobile Phone: [phone] Relation: Daughter Secondary Emergency Contact: [place] Phone: [phone] Mobile Phone: [phone] Relation: Other  Code Status:  DNR Goals of care: Advanced Directive information Advanced Directives 09/12/2020  Does Patient Have a Medical Advance Directive? Yes  Type of Advance Directive Out of facility DNR (pink MOST or yellow form);Healthcare Power of Attorney  Does patient want to make changes to medical advance directive? No - Patient declined  Copy of [place] in Chart? Yes - validated most recent copy scanned in chart (See row information)  Would patient like information on creating a medical advance directive? -  Pre-existing out of facility DNR order (yellow form or pink MOST form) -     Chief Complaint  Patient presents with  . Medical Management of Chronic Issues    Routine Visit.  Marland Kitchen Health Maintenance    Discuss the need for Hepatitis C Screening, and Hemoglobin A1C  . Immunizations    Discuss the need for Tetanus Vaccine, and Influenza Vaccine.    HPI:  Pt is a 79 y.o. male seen today for medical management of chronic diseases. He has a medical history of Hypertension,CHF,Hyperlipidemia,controlled Type 2 DM,Afib on coumadin,ESRD on dialysis ,BPH,Anemia among other condition. He is seen in bed with facility Nurse and Nurse assistant present at  bedside.NA states unable to retract patient's foreskin for peri-care also has soreness on glans area No drainage noted.He denies any fever or chills. His weight has been stable for the past one month.Currently on protein supplement.  Past Medical History:  Diagnosis Date  . A-fib ([place])   . Anemia   . Aortic stenosis   . Blood transfusion   . BPH (benign prostatic hyperplasia)   . CHF (congestive heart failure) ([place])   . Diarrhea   . DM (diabetes mellitus) ([place])    Type II-   not on medication   . ESRD on hemodialysis ([place])    Started dialysis in 2009  . History of GI bleed    secondary to coumadin  . HTN (hypertension)   . Hyperlipidemia   . OSA (obstructive sleep apnea)    uses CPAP  . Sacral wound    stage II - healed, has a protective dressing on- 12/28/2019  . Secondary hyperparathyroidism of renal origin [facility])    Past Surgical History:  Procedure Laterality Date  . ABDOMINAL AORTOGRAM N/A 11/15/2018   Procedure: ABDOMINAL AORTOGRAM;  Surgeon: [name], MD;  Location: [place] CV LAB;  Service: Cardiovascular;  Laterality: N/A;  . AMPUTATION Left 12/06/2018   Procedure: AMPUTATION DIGIT LEFT FIFTH TOE;  Surgeon: Angelia Mould, MD;  Location: [place];  Service: Vascular;  Laterality: Left;  . AMPUTATION Bilateral 12/29/2018   Procedure: AMPUTATION ABOVE KNEE;  Surgeon: Waynetta Sandy, MD;  Location: [place];  Service: Vascular;  Laterality: Bilateral;  . APPLICATION OF WOUND VAC Right 02/17/2019   Procedure: Application Of Wound Vac;  Surgeon:  Marty Heck, MD;  Location: [place];  Service: Vascular;  Laterality: Right;  . BVT  2/45/80   Left  Basilic Vein Transposition  . CHOLECYSTECTOMY    . EYE SURGERY     Catarct bil  . I & D EXTREMITY Right 02/17/2019   Procedure: Right above the kneee debridement;  Surgeon: Marty Heck, MD;  Location: [place];  Service: Vascular;  Laterality: Right;  . INSERTION OF DIALYSIS CATHETER  05/28/2012    Procedure: INSERTION OF DIALYSIS CATHETER;  Surgeon: Mal Misty, MD;  Location: [place];  Service: Vascular;  Laterality: Right;  . INSERTION OF DIALYSIS CATHETER Right 01/02/2020   Procedure: INSERTION OF DIALYSIS CATHETER RIGHT INTERNAL JUGULAR;  Surgeon: Elam Dutch, MD;  Location: [place];  Service: Vascular;  Laterality: Right;  . INSERTION OF DIALYSIS CATHETER Right 01/03/2020   Procedure: INSERTION OF DIALYSIS CATHETER RIGHT INTERNAL JUGULAR;  Surgeon: Rosetta Posner, MD;  Location: [place];  Service: Vascular;  Laterality: Right;  . Left arm shuntogram.    . Left forearm loop graft with 6 mm Gore-Tex graft.    . LOWER EXTREMITY ANGIOGRAPHY Bilateral 11/15/2018   Procedure: LOWER EXTREMITY ANGIOGRAPHY;  Surgeon: [name], MD;  Location: [place] CV LAB;  Service: Cardiovascular;  Laterality: Bilateral;  . Pars plana vitrectomy with 25-gauge system    . PERIPHERAL VASCULAR ATHERECTOMY Left 11/15/2018   Procedure: PERIPHERAL VASCULAR ATHERECTOMY;  Surgeon: [name], MD;  Location: [place] CV LAB;  Service: Cardiovascular;  Laterality: Left;  SFA with STENT  . PERIPHERAL VASCULAR BALLOON ANGIOPLASTY Left 11/15/2018   Procedure: PERIPHERAL VASCULAR BALLOON ANGIOPLASTY;  Surgeon: [name], MD;  Location: [place] CV LAB;  Service: Cardiovascular;  Laterality: Left;  PT  . REVISON OF ARTERIOVENOUS FISTULA Left 01/02/2020   Procedure: REVISON OF LEFT ARM ARTERIOVENOUS FISTULA;  Surgeon: Elam Dutch, MD;  Location: [facility] [place];  Service: Vascular;  Laterality: Left;    Allergies  Allergen Reactions  . Occlusive Silicone Sheets [Silicone] Other (See Comments)    "Allergic," per MAR  . Other Other (See Comments)    Unknown reaction to Occlusive adhesive- "Allergic," per MAR  . Sulfa Antibiotics   . Tape Itching and Other (See Comments)    Use Cloth tape only, please Use Cloth tape only, please    Allergies as of 09/12/2020      Reactions   Occlusive Silicone  Sheets [silicone] Other (See Comments)   "Allergic," per [facility]   Other Other (See Comments)   Unknown reaction to Occlusive adhesive- "Allergic," per MAR   Sulfa Antibiotics    Tape Itching, Other (See Comments)   Use Cloth tape only, please Use Cloth tape only, please      Medication List       Accurate as of September 12, 2020 10:47 PM. If you have any questions, ask your nurse or doctor.        acetaminophen 500 MG tablet Commonly known as: TYLENOL Take 1,000 mg by mouth 2 (two) times daily.   atorvastatin 10 MG tablet Commonly known as: LIPITOR Take 10 mg by mouth daily.   dicyclomine 20 MG tablet Commonly known as: BENTYL Take 20 mg by mouth 3 (three) times daily before meals.   divalproex 250 MG DR tablet Commonly known as: DEPAKOTE Take 250 mg by mouth daily. What changed: Another medication with the same name was removed. Continue taking this medication, and follow the directions you see here.  Changed by: Sandrea Hughs, NP   feeding supplement (PRO-STAT SUGAR FREE 64) Liqd Take 30 mLs by mouth 2 (two) times daily. 9a and 5p   Hectorol 4 MCG/2ML injection Generic drug: doxercalciferol Inject 2 mcg into the vein every Monday, Wednesday, and Friday with hemodialysis.   HYDROcodone-acetaminophen 5-325 MG tablet Commonly known as: NORCO/VICODIN Take 1 tablet by mouth 2 (two) times daily. For Pain   loperamide 2 MG tablet Commonly known as: IMODIUM A-D Take 4 mg by mouth See admin instructions. Take 2 tablets ( 4 mg) by mouth after 1st loose stool as needed for diarrhea   metoprolol tartrate 25 MG tablet Commonly known as: LOPRESSOR Give 1 tablet by mouth at bedtime on Mon Wed ,Fri   metoprolol tartrate 25 MG tablet Commonly known as: LOPRESSOR Give 1 tablet by mouth twice daily on Tues ,Thur ,Sat ,Sun   midodrine 10 MG tablet Commonly known as: PROAMATINE Take one tablet (10 mg) by mouth on Monday, Wednesday, Friday 30 minutes prior to dialysis.     multivitamin Tabs tablet Take 1 tablet by mouth at bedtime.   NEPRO PO Take 1 Can by mouth 2 (two) times daily.   NON FORMULARY DIET :Regular NAS Diet with Double meat Portion,1200 ml fluid restriction. Diet liberalized to promote PO Intake.   pantoprazole 40 MG tablet Commonly known as: PROTONIX Take 1 tablet (40 mg total) by mouth daily.   sevelamer carbonate 800 MG tablet Commonly known as: RENVELA Take 1,600 mg by mouth 3 (three) times daily with meals.   Vitamin D3 1.25 MG (50000 UT) Caps Take 50,000 Units by mouth every Friday.   warfarin 4 MG tablet Commonly known as: COUMADIN Take 4 mg by mouth daily.       Review of Systems  Constitutional: Negative for appetite change, chills, fatigue and fever.  HENT: Negative for congestion, postnasal drip, rhinorrhea, sinus pressure, sinus pain, sneezing and sore throat.   Eyes: Negative for pain, discharge, redness and itching.  Respiratory: Negative for cough, chest tightness, shortness of breath and wheezing.   Cardiovascular: Negative for chest pain and palpitations.       Bilateral AKA  Gastrointestinal: Negative for abdominal distention, abdominal pain, constipation, diarrhea, nausea and vomiting.  Endocrine: Negative for cold intolerance, heat intolerance, polydipsia, polyphagia and polyuria.  Genitourinary:       On Hemodialysis three time per week   Musculoskeletal: Positive for arthralgias. Negative for joint swelling and myalgias.       Requires assistance with transfer   Skin: Negative for color change, pallor and rash.  Neurological: Negative for dizziness, seizures, speech difficulty, light-headedness and headaches.  Hematological: Does not bruise/bleed easily.  Psychiatric/Behavioral: Negative for agitation, behavioral problems and sleep disturbance. The patient is not nervous/anxious.     Immunization History  Administered Date(s) Administered  . Influenza-Unspecified 08/29/2019  . Moderna SARS-COVID-2  Vaccination 12/28/2019, 01/25/2020  . Pneumococcal Polysaccharide-23 10/07/2019   Pertinent  Health Maintenance Due  Topic Date Due  . HEMOGLOBIN A1C  07/19/2020  . INFLUENZA VACCINE  07/28/2020  . PNA vac Low Risk Adult (2 of 2 - PCV13) 10/06/2020  . OPHTHALMOLOGY EXAM  12/18/2020  . FOOT EXAM  Discontinued  . URINE MICROALBUMIN  Discontinued   No flowsheet data found. Functional Status Survey:    Vitals:   09/12/20 1239  BP: 93/60  Pulse: 74  Resp: 18  Temp: (!) 97.3 F (36.3 C)  SpO2: 95%  Weight: 123 lb 8 oz (56 kg)  Height: 3' (0.914 m)   Body mass index is 67 kg/m. Physical Exam Vitals and nursing note reviewed. Exam conducted with a chaperone present (facility Nurse and Nurse Aide present at bedside ).  Constitutional:      General: He is not in acute distress.    Appearance: He is not ill-appearing.  HENT:     Head: Normocephalic.     Nose: Nose normal. No congestion or rhinorrhea.     Mouth/Throat:     Mouth: Mucous membranes are moist.     Pharynx: Oropharynx is clear. No oropharyngeal exudate or posterior oropharyngeal erythema.  Eyes:     General: No scleral icterus.       Right eye: No discharge.        Left eye: No discharge.     Conjunctiva/sclera: Conjunctivae normal.     Pupils: Pupils are equal, round, and reactive to light.  Cardiovascular:     Rate and Rhythm: Normal rate and regular rhythm.     Pulses: Normal pulses.     Heart sounds: Murmur heard.  No friction rub. No gallop.   Pulmonary:     Effort: Pulmonary effort is normal. No respiratory distress.     Breath sounds: Normal breath sounds. No wheezing, rhonchi or rales.  Chest:     Chest wall: No tenderness.  Abdominal:     General: Bowel sounds are normal. There is no distension.     Palpations: Abdomen is soft. There is no mass.     Tenderness: There is no abdominal tenderness. There is no right CVA tenderness, left CVA tenderness, guarding or rebound.  Genitourinary:    Penis:  Uncircumcised. Phimosis and erythema present. No discharge or swelling.   Musculoskeletal:        General: No swelling or tenderness.     Cervical back: Normal range of motion. No rigidity or tenderness.     Comments: Moves upper extremities.bilateral AKA   Lymphadenopathy:     Cervical: No cervical adenopathy.  Skin:    General: Skin is warm and dry.     Coloration: Skin is not pale.     Findings: No bruising or rash.     Comments: Skin excoriation on tip of glans tenderness. No drainage noted.   Neurological:     Mental Status: He is alert. Mental status is at baseline.     Cranial Nerves: No cranial nerve deficit.     Motor: No weakness.     Coordination: Coordination normal.     Gait: Gait abnormal.    Labs reviewed: Recent Labs    01/02/20 0841 01/02/20 0841 01/03/20 1106 07/03/20 2040 07/05/20 0000  NA 140   < > 138 142 145  K 3.6   < > 3.7 3.7 4.2  CL 95*   < > 101 96* 99  CO2  --   --   --  31 29*  GLUCOSE 120*  --  102* 118*  --   BUN 28*   < > 38* 51* 49*  CREATININE 3.80*   < > 5.90* 5.17* 4.2*  CALCIUM  --   --   --  9.5 10.0   < > = values in this interval not displayed.   Recent Labs    07/05/20 0000  AST 23  ALT 20  ALBUMIN 4.2   Recent Labs    01/03/20 1106 07/03/20 2040 07/05/20 0000  WBC  --  6.9 6.5  NEUTROABS  --  4.2  --  HGB 10.2* 11.2* 10.9*  HCT 30.0* 35.1* 33*  MCV  --  103.5*  --   PLT  --  133* 135*   Lab Results  Component Value Date   TSH 1.839 05/25/2019   Lab Results  Component Value Date   HGBA1C 3.9 01/20/2020   Lab Results  Component Value Date   CHOL 107 09/11/2020   HDL 37 09/11/2020   LDLCALC 43 09/11/2020   TRIG 135 09/11/2020   CHOLHDL 2.9 11/11/2018    Significant Diagnostic Results in last 30 days:  No results found.  Assessment/Plan 1. Phimosis Difficulty in retracting foreskin back.skin excoriation noted on tip of glans tender to touch. - Bactroban 2% ex ointment one application to affected  areas on tip of glans three times daily.facility Nurse to cleanse area with warm water and dial soap,rinse pat dry prior to applying bactroban.monitor for signs of infection. - Urgent referral to Urologist for evaluation order given to facility referral coordinator   2. 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 PAF (paroxysmal atrial fibrillation) (HCC) HR irregular. - continue on coumadin and metoprolol  - continue to monitor INR   4. Chronic diastolic congestive heart failure (HCC) No signs of fluid overload.Not on diuretic but has Hemodialysis three times per week.   5. Hyperlipidemia associated with type 2 diabetes mellitus ([place]) LDL at goal  Continue on Atorvastatin   Family/ staff Communication: Reviewed plan of care with patient and facility   Labs/tests ordered: None

## 2020-09-23 ENCOUNTER — Other Ambulatory Visit: Payer: Self-pay | Admitting: Family

## 2020-09-23 MED ORDER — HYDROCODONE-ACETAMINOPHEN 5-325 MG PO TABS: 1.0000 | ORAL_TABLET | Freq: Two times a day (BID) | ORAL | 0 refills | Status: DC

## 2020-09-23 NOTE — Progress Notes (Signed)
Norco 5/325 mg tablet one by mouth twice daily refilled.# 60 with no refills.

## 2020-10-09 ENCOUNTER — Emergency Department (HOSPITAL_COMMUNITY)
Admission: EM | Admit: 2020-10-09 | Discharge: 2020-10-10 | Disposition: A | Discharge status: Home / Self Care | Payer: Medicare Other | Attending: Emergency Medicine | Admitting: Emergency Medicine

## 2020-10-09 ENCOUNTER — Other Ambulatory Visit: Payer: Self-pay

## 2020-10-09 DIAGNOSIS — I509 Heart failure, unspecified: Secondary | ICD-10-CM | POA: Insufficient documentation

## 2020-10-09 DIAGNOSIS — T82590A Other mechanical complication of surgically created arteriovenous fistula, initial encounter: Secondary | ICD-10-CM | POA: Diagnosis not present

## 2020-10-09 DIAGNOSIS — E1122 Type 2 diabetes mellitus with diabetic chronic kidney disease: Secondary | ICD-10-CM | POA: Diagnosis not present

## 2020-10-09 DIAGNOSIS — T829XXA Unspecified complication of cardiac and vascular prosthetic device, implant and graft, initial encounter: Secondary | ICD-10-CM

## 2020-10-09 DIAGNOSIS — Z87891 Personal history of nicotine dependence: Secondary | ICD-10-CM | POA: Insufficient documentation

## 2020-10-09 DIAGNOSIS — Z992 Dependence on renal dialysis: Secondary | ICD-10-CM | POA: Diagnosis not present

## 2020-10-09 DIAGNOSIS — Z79899 Other long term (current) drug therapy: Secondary | ICD-10-CM | POA: Diagnosis not present

## 2020-10-09 DIAGNOSIS — N186 End stage renal disease: Secondary | ICD-10-CM

## 2020-10-09 DIAGNOSIS — I132 Hypertensive heart and chronic kidney disease with heart failure and with stage 5 chronic kidney disease, or end stage renal disease: Secondary | ICD-10-CM | POA: Insufficient documentation

## 2020-10-09 DIAGNOSIS — T82898A Other specified complication of vascular prosthetic devices, implants and grafts, initial encounter: Secondary | ICD-10-CM | POA: Diagnosis not present

## 2020-10-09 LAB — CBC
HCT: 37.6 % — ABNORMAL LOW (ref 39.0–52.0)
Hemoglobin: 12.1 g/dL — ABNORMAL LOW (ref 13.0–17.0)
MCH: 32.4 pg (ref 26.0–34.0)
MCHC: 32.2 g/dL (ref 30.0–36.0)
MCV: 100.5 fL — ABNORMAL HIGH (ref 80.0–100.0)
Platelets: 120 10*3/uL — ABNORMAL LOW (ref 150–400)
RBC: 3.74 MIL/uL — ABNORMAL LOW (ref 4.22–5.81)
RDW: 15.8 % — ABNORMAL HIGH (ref 11.5–15.5)
WBC: 7.9 10*3/uL (ref 4.0–10.5)
nRBC: 0 % (ref 0.0–0.2)

## 2020-10-09 LAB — BASIC METABOLIC PANEL
Anion gap: 20 — ABNORMAL HIGH (ref 5–15)
BUN: 95 mg/dL — ABNORMAL HIGH (ref 8–23)
CO2: 23 mmol/L (ref 22–32)
Calcium: 9.7 mg/dL (ref 8.9–10.3)
Chloride: 94 mmol/L — ABNORMAL LOW (ref 98–111)
Creatinine, Ser: 8.21 mg/dL — ABNORMAL HIGH (ref 0.61–1.24)
GFR, Estimated: 6 mL/min — ABNORMAL LOW (ref >60)
Glucose, Bld: 97 mg/dL (ref 70–99)
Potassium: 4.9 mmol/L (ref 3.5–5.1)
Sodium: 137 mmol/L (ref 135–145)

## 2020-10-09 LAB — PROTIME-INR
INR: 2.1 — ABNORMAL HIGH (ref 0.8–1.2)
Prothrombin Time: 23 seconds — ABNORMAL HIGH (ref 11.4–15.2)

## 2020-10-09 NOTE — ED Notes (Signed)
Pt assisted with bedpan for bowel movement; pt had gas instead.  Pt to be discharged; report called to Salomon Fick at [facility]; awaiting transport.

## 2020-10-09 NOTE — ED Notes (Signed)
Pt placed on bedpan

## 2020-10-09 NOTE — ED Notes (Signed)
Awaiting transport for patient to return home.

## 2020-10-09 NOTE — Discharge Instructions (Addendum)
1.  Your fistula has been examined by the vascular surgeon, Dr. Donnetta Hutching in the emergency department.  At this time, there does not appear to be any complication that needs further testing or treatment by surgery.  You have been advised it is safe to resume normal dialysis using the fistula. 2.  Return to the emergency department if you develop redness, drainage, bleeding or significant increased swelling at the site. 3.  Follow-up with vascular surgery if you need additional evaluation of the fistula.

## 2020-10-09 NOTE — ED Notes (Signed)
Confirmed PTAR called with Barrister's clerk. Previous RN Education officer, museum confirmed giving report to Forensic scientist at [employer] at [phone].   Pt moved from hallway bed into room, given ED happy meal, and repositioned for comfort. Pt continues to wait for PTAR

## 2020-10-09 NOTE — ED Provider Notes (Signed)
[facility] EMERGENCY DEPARTMENT Provider Note   CSN: [id number] Arrival date & time: 10/09/20  1214     History Chief Complaint  Patient presents with  . Arm Injury    [name] is a 79 y.o. male.  HPI Patient report he went to dialysis this morning.  He has some swelling and discomfort in his left AV fistula.  Triage note indicates patient may have complained of swelling and tingling upon arrival to dialysis.  He is not specific in describing onset of symptoms and what happened at dialysis today.  He is uncertain if he completed his dialysis session.  Reports he was not there very long.  He was told they had a problem drawing blood.  He reports he was told to come to the emergency department.  Reports he has some atypical discomfort and swelling around the fistula site.  He denies he is having any shortness of breath or chest pain.  He denies any fever chills or cough.     Past Medical History:  Diagnosis Date  . A-fib ([place])   . Anemia   . Aortic stenosis   . Blood transfusion   . BPH (benign prostatic hyperplasia)   . CHF (congestive heart failure) ([place])   . Diarrhea   . DM (diabetes mellitus) ([place])    Type II-   not on medication   . ESRD on hemodialysis ([place])    Started dialysis in 2009  . History of GI bleed    secondary to coumadin  . HTN (hypertension)   . Hyperlipidemia   . OSA (obstructive sleep apnea)    uses CPAP  . Sacral wound    stage II - healed, has a protective dressing on- 12/28/2019  . Secondary hyperparathyroidism of renal origin [facility])     Patient Active Problem List   Diagnosis Date Noted  . Anticoagulated on Coumadin 12/24/2019  . Infection due to ESBL-producing Escherichia coli 08/12/2019  . MRSA cellulitis 08/12/2019  . Sacral decubitus ulcer, stage IV ([place]) 08/05/2019  . Infected decubitus ulcer, stage IV ([place]) 08/05/2019  . Hypertensive heart and kidney disease with chronic diastolic congestive heart failure and  stage 5 chronic kidney disease on chronic dialysis ([place]) 03/24/2019  . 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 ([place]) 03/24/2019  . Anemia due to end stage renal disease ([place]) 03/24/2019  . Protein-calorie malnutrition, severe ([place]) 03/24/2019  . Hyperlipidemia associated with type 2 diabetes mellitus ([place]) 02/22/2019  . Amputation stump infection ([place]) 02/15/2019  . Conjunctivitis of left eye 01/04/2019  . 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[place]) 12/30/2018  . Open wound of left foot   . Encounter for monitoring Coumadin therapy   . OSA (obstructive sleep apnea)   . ESRD on hemodialysis ([place])   . BPH (benign prostatic hyperplasia)   . Hypotension 12/07/2018  . GERD (gastroesophageal reflux disease) 12/07/2018  . Pressure injury of skin 12/03/2018  . Symptomatic anemia 12/02/2018  . Acute on chronic blood loss anemia 12/02/2018  . 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 Lower GI bleeding 12/02/2018  . Heme positive stool   . Acute on chronic diastolic (congestive) heart failure ([place]) 11/19/2018  . Acute CVA (cerebrovascular accident) ([place]) 11/19/2018  . Hypoglycemia 11/19/2018  . Peripheral vascular disease due to secondary diabetes ([place]) 11/19/2018  . Dry gangrene ([place]) 11/19/2018  . Cerebral thrombosis with cerebral  infarction 11/13/2018  . DNR (do not resuscitate) discussion   . Goals of care, counseling/discussion   . Palliative care by specialist   . Acute hypoxemic respiratory failure ([place]) 11/06/2018  . Volume overload 11/06/2018  . Multifocal pneumonia 11/06/2018  . Acute metabolic encephalopathy 18/29/9371  . Physical deconditioning 11/06/2018  . Acute respiratory failure with hypoxia and hypercapnia ([place]) 11/04/2018  . CAP (community acquired pneumonia) 11/04/2018  . Pleural effusion, right 11/04/2018  . Increased ammonia level 11/04/2018  . Abnormal CT of  the abdomen 11/04/2018  . Atrial fibrillation with RVR ([place]) 11/04/2018  . Sepsis ([place]) 10/21/2018  . Acute encephalopathy 10/21/2018  . Elevated alkaline phosphatase level 10/21/2018  . Chronic anemia 10/21/2018  . Thrombocytopenia ([place]) 10/21/2018  . Pulmonary HTN ([place]) 03/04/2017  . Aortic valve stenosis 03/04/2017  . Hypertension, accelerated with heart disease, without CHF 01/12/2017  . Dyspnea on exertion 01/12/2017  . ESRD (end stage renal disease) ([place]) 06/07/2012  . Hyperlipidemia 04/24/2011  . HYPERTENSION, BENIGN 10/24/2009  . Type 2 diabetes mellitus with hypertension and end stage renal disease on dialysis ([place]) 10/23/2009  . OBSTRUCTIVE SLEEP APNEA 10/23/2009  . PAF (paroxysmal atrial fibrillation) ([place]) 10/23/2009  . CHF (congestive heart failure) ([place]) 10/23/2009    Past Surgical History:  Procedure Laterality Date  . ABDOMINAL AORTOGRAM N/A 11/15/2018   Procedure: ABDOMINAL AORTOGRAM;  Surgeon: [name], MD;  Location: [facility] CV [facility];  Service: Cardiovascular;  Laterality: N/A;  . AMPUTATION Left 12/06/2018   Procedure: AMPUTATION DIGIT LEFT FIFTH TOE;  Surgeon: Angelia Mould, MD;  Location: [facility];  Service: Vascular;  Laterality: Left;  . AMPUTATION Bilateral 12/29/2018   Procedure: AMPUTATION ABOVE KNEE;  Surgeon: Waynetta Sandy, MD;  Location: [facility];  Service: Vascular;  Laterality: Bilateral;  . APPLICATION OF WOUND VAC Right 02/17/2019   Procedure: Application Of Wound Vac;  Surgeon: Marty Heck, MD;  Location: [facility];  Service: Vascular;  Laterality: Right;  . BVT  6/96/78   Left  Basilic Vein Transposition  . CHOLECYSTECTOMY    . EYE SURGERY     Catarct bil  . I & D EXTREMITY Right 02/17/2019   Procedure: Right above the kneee debridement;  Surgeon: Marty Heck, MD;  Location: [facility];  Service: Vascular;  Laterality: Right;  . INSERTION OF DIALYSIS CATHETER  05/28/2012   Procedure: INSERTION OF DIALYSIS CATHETER;   Surgeon: Mal Misty, MD;  Location: [facility];  Service: Vascular;  Laterality: Right;  . INSERTION OF DIALYSIS CATHETER Right 01/02/2020   Procedure: INSERTION OF DIALYSIS CATHETER RIGHT INTERNAL JUGULAR;  Surgeon: Elam Dutch, MD;  Location: [facility];  Service: Vascular;  Laterality: Right;  . INSERTION OF DIALYSIS CATHETER Right 01/03/2020   Procedure: INSERTION OF DIALYSIS CATHETER RIGHT INTERNAL JUGULAR;  Surgeon: Rosetta Posner, MD;  Location: [facility];  Service: Vascular;  Laterality: Right;  . Left arm shuntogram.    . Left forearm loop graft with 6 mm Gore-Tex graft.    . LOWER EXTREMITY ANGIOGRAPHY Bilateral 11/15/2018   Procedure: LOWER EXTREMITY ANGIOGRAPHY;  Surgeon: [name], MD;  Location: [facility] CV LAB;  Service: Cardiovascular;  Laterality: Bilateral;  . Pars plana vitrectomy with 25-gauge system    . PERIPHERAL VASCULAR ATHERECTOMY Left 11/15/2018   Procedure: PERIPHERAL VASCULAR ATHERECTOMY;  Surgeon: [name], MD;  Location: [facility] CV LAB;  Service: Cardiovascular;  Laterality: Left;  SFA with STENT  . PERIPHERAL VASCULAR BALLOON ANGIOPLASTY Left  11/15/2018   Procedure: PERIPHERAL VASCULAR BALLOON ANGIOPLASTY;  Surgeon: [name], MD;  Location: [facility] CV LAB;  Service: Cardiovascular;  Laterality: Left;  PT  . REVISON OF ARTERIOVENOUS FISTULA Left 01/02/2020   Procedure: REVISON OF LEFT ARM ARTERIOVENOUS FISTULA;  Surgeon: Elam Dutch, MD;  Location: [facility] OR;  Service: Vascular;  Laterality: Left;       Family History  Problem Relation Age of Onset  . Alzheimer's disease Mother   . Diabetes Father        Amputation:  bilateral legs  . Cancer Daughter        breast cancer  . Diabetes Son   . Heart disease Son        before age 77  . Hypertension Son   . Anesthesia problems Neg Hx     Social History   Tobacco Use  . Smoking status: Former Smoker    Types: Cigarettes    Quit date: 06/30/2004    Years since quitting: 16.2  .  Smokeless tobacco: Never Used  Vaping Use  . Vaping Use: Never used  Substance Use Topics  . Alcohol use: No  . Drug use: No    Home Medications Prior to Admission medications   Medication Sig Start Date End Date Taking? Authorizing Provider  acetaminophen (TYLENOL) 500 MG tablet Take 1,000 mg by mouth 2 (two) times daily.     [provider]  Amino Acids-Protein Hydrolys (FEEDING SUPPLEMENT, PRO-STAT SUGAR FREE 64,) LIQD Take 30 mLs by mouth 2 (two) times daily. 9a and 5p    [provider]  atorvastatin (LIPITOR) 10 MG tablet Take 10 mg by mouth daily.    [provider]  Cholecalciferol (VITAMIN D3) 1.25 MG (50000 UT) CAPS Take 50,000 Units by mouth every Friday.     [provider]  dicyclomine (BENTYL) 20 MG tablet Take 20 mg by mouth 3 (three) times daily before meals.    [provider]  divalproex (DEPAKOTE) 250 MG DR tablet Take 250 mg by mouth daily.    [provider]  doxercalciferol (HECTOROL) 4 MCG/2ML injection Inject 2 mcg into the vein every Monday, Wednesday, and Friday with hemodialysis.    [provider]  HYDROcodone-acetaminophen (NORCO/VICODIN) 5-325 MG tablet Take 1 tablet by mouth 2 (two) times daily. For Pain 09/23/20   Ngetich, Dinah C, NP  loperamide (IMODIUM A-D) 2 MG tablet Take 4 mg by mouth See admin instructions. Take 2 tablets ( 4 mg) by mouth after 1st loose stool as needed for diarrhea    [provider]  metoprolol tartrate (LOPRESSOR) 25 MG tablet Give 1 tablet by mouth at bedtime on Mon Wed ,Fri    [provider]  metoprolol tartrate (LOPRESSOR) 25 MG tablet Give 1 tablet by mouth twice daily on Tues ,Thur ,Sat ,Sun    [provider]  midodrine (PROAMATINE) 10 MG tablet Take one tablet (10 mg) by mouth on Monday, Wednesday, Friday 30 minutes prior to dialysis.    [provider]  multivitamin (RENA-VIT) TABS tablet Take 1 tablet by mouth at bedtime.     [provider]  NON FORMULARY DIET :Regular NAS Diet with Double meat Portion,1200 ml fluid restriction. Diet liberalized to promote PO Intake.    [provider]  Nutritional Supplements (NEPRO PO) Take 1 Can by mouth 2 (two) times daily.     [provider]  pantoprazole (PROTONIX) 40 MG tablet Take 1 tablet (40 mg total) by  mouth daily. 01/01/19   Domenic Polite, MD  sevelamer carbonate (RENVELA) 800 MG tablet Take 1,600 mg by mouth 3 (three) times daily with meals.    [provider]  warfarin (COUMADIN) 4 MG tablet Take 4 mg by mouth daily.     [provider]    Allergies    Occlusive silicone sheets [silicone], Other, Sulfa antibiotics, and Tape  Review of Systems   Review of Systems 10 systems reviewed and negative except as per HPI Physical Exam Updated Vital Signs BP 117/64 (BP Location: Right Arm)   Pulse 65   Temp 97.7 F (36.5 C) (Oral)   Resp 17   SpO2 99%   Physical Exam Constitutional:      Comments: Alert.  Clear mental status.  No respiratory distress.  Eyes:     Extraocular Movements: Extraocular movements intact.  Cardiovascular:     Comments: Heart regular.  3 out of 6 systolic ejection murmur. Pulmonary:     Comments: No respiratory distress.  Lungs are bilaterally clear to auscultation. Abdominal:     General: There is no distension.     Palpations: Abdomen is soft.     Tenderness: There is no abdominal tenderness. There is no guarding.  Musculoskeletal:     Comments: Left AV fistula upper arm has palpable thrill.  There is a more pronounced, tortuous area of the mid fistula.  Patient is unclear if this is any different from baseline.  He does have tenderness and some moderate swelling of the upper arm just proximal to the [facility] fossa.  No tenderness of the lower arm.  Well-healed scars on the lower arm.  Hand is warm and dry.  Brisk cap refill.  Skin:    General: Skin is warm and dry.  Neurological:      General: No focal deficit present.     Mental Status: He is oriented to person, place, and time.     Comments: Patient has significant baseline restrictions due to bilateral lower extremity amputations.  His mental status is clear.  His speech is clear and situationally appropriate.  Patient follows commands to the best of physical capacity.  Psychiatric:        Mood and Affect: Mood normal.     ED Results / Procedures / Treatments   Labs (all labs ordered are listed, but only abnormal results are displayed) Labs Reviewed  BASIC METABOLIC PANEL - Abnormal; Notable for the following components:      Result Value   Chloride 94 (*)    BUN 95 (*)    Creatinine, Ser 8.21 (*)    GFR, Estimated 6 (*)    Anion gap 20 (*)    All other components within normal limits  CBC - Abnormal; Notable for the following components:   RBC 3.74 (*)    Hemoglobin 12.1 (*)    HCT 37.6 (*)    MCV 100.5 (*)    RDW 15.8 (*)    Platelets 120 (*)    All other components within normal limits  PROTIME-INR - Abnormal; Notable for the following components:   Prothrombin Time 23.0 (*)    INR 2.1 (*)    All other components within normal limits    EKG None  Radiology No results found.  Procedures Procedures (including critical care time)  Medications Ordered in ED Medications - No data to display  ED Course  I have reviewed the triage vital signs and the nursing notes.  Pertinent labs & imaging  results that were available during my care of the patient were reviewed by me and considered in my medical decision making (see chart for details).    MDM Rules/Calculators/A&P                         Consult: Dr. Donnetta Hutching evaluate the patient emergency department.  At this time no apparent complication by physical exam.  Patient advised he may resume dialysis per usual.  Patient presents as outlined.  Triage note indicates he was sent from dialysis with concern for functioning of AV fistula.  At this time,  exam does not indicate acute complications.  Patient is a poor historian in terms of describing onset of symptoms and events at dialysis today.  Patient does not have leukocytosis, potassium is not elevated, INR therapeutic and clinically patient shows no signs of volume overload.  His lungs are clear and he has no respiratory distress.  Currently stable for discharge.  Return precautions reviewed. Final Clinical Impression(s) / ED Diagnoses Final diagnoses:  Complication of AV dialysis fistula, initial encounter    Rx / DC Orders ED Discharge Orders    None       Charlesetta Shanks, MD 10/09/20 2200

## 2020-10-09 NOTE — ED Notes (Signed)
Pt asked to be rolled outside, stated he is leaving.

## 2020-10-09 NOTE — ED Triage Notes (Signed)
Pt MWF dialysis pt, had full tx on Monday and since then has tingling and swelling to fistula in L arm. Dialysis center unable to access site d/t same today, requested EMS transport to ED to get a replacement fistula.

## 2020-10-09 NOTE — Consult Note (Signed)
Vascular and Vein Specialist   Patient name: [name] MRN: [id number] DOB: 1941-04-29 Sex: male  Reason for consult: Possible malfunctioning left upper arm AV fistula  HPI: [name] is a 79 y.o. male presented to the emergency room at the direction of his dialysis unit.  He is an extremely poor historian.  He he is in failing health.  Apparently there was some concern regarding swelling and possible difficult access of his fistula.  The patient cannot explain while he is here and has no recollection of what he was told at the dialysis center today.  He had successful hemodialysis on Monday.  In reviewing his chart it appears as though he had a basilic vein transposition fistula created in June 2013.  I do not see any evidence that there have been revisions.  Past Medical History:  Diagnosis Date  . A-fib ([place])   . Anemia   . Aortic stenosis   . Blood transfusion   . BPH (benign prostatic hyperplasia)   . CHF (congestive heart failure) ([place])   . Diarrhea   . DM (diabetes mellitus) ([place])    Type II-   not on medication   . ESRD on hemodialysis ([place])    Started dialysis in 2009  . History of GI bleed    secondary to coumadin  . HTN (hypertension)   . Hyperlipidemia   . OSA (obstructive sleep apnea)    uses CPAP  . Sacral wound    stage II - healed, has a protective dressing on- 12/28/2019  . Secondary hyperparathyroidism of renal origin [facility])     Family History  Problem Relation Age of Onset  . Alzheimer's disease Mother   . Diabetes Father        Amputation:  bilateral legs  . Cancer Daughter        breast cancer  . Diabetes Son   . Heart disease Son        before age 51  . Hypertension Son   . Anesthesia problems Neg Hx     SOCIAL HISTORY: Social History   Tobacco Use  . Smoking status: Former Smoker    Types: Cigarettes    Quit date: 06/30/2004    Years since quitting: 16.2  . Smokeless tobacco: Never Used  Substance Use Topics  .  Alcohol use: No    Allergies  Allergen Reactions  . Occlusive Silicone Sheets [Silicone] Other (See Comments)    "Allergic," per MAR  . Other Other (See Comments)    Unknown reaction to Occlusive adhesive- "Allergic," per MAR  . Sulfa Antibiotics   . Tape Itching and Other (See Comments)    Use Cloth tape only, please Use Cloth tape only, please    No current facility-administered medications for this encounter.   Current Outpatient Medications  Medication Sig Dispense Refill  . acetaminophen (TYLENOL) 500 MG tablet Take 1,000 mg by mouth 2 (two) times daily.     . Amino Acids-Protein Hydrolys (FEEDING SUPPLEMENT, PRO-STAT SUGAR FREE 64,) LIQD Take 30 mLs by mouth 2 (two) times daily. 9a and 5p    . atorvastatin (LIPITOR) 10 MG tablet Take 10 mg by mouth daily.    . Cholecalciferol (VITAMIN D3) 1.25 MG (50000 UT) CAPS Take 50,000 Units by mouth every Friday.     . dicyclomine (BENTYL) 20 MG tablet Take 20 mg by mouth 3 (three) times daily before meals.    . divalproex (  DEPAKOTE) 250 MG DR tablet Take 250 mg by mouth daily.    Marland Kitchen doxercalciferol (HECTOROL) 4 MCG/2ML injection Inject 2 mcg into the vein every Monday, Wednesday, and Friday with hemodialysis.    Marland Kitchen HYDROcodone-acetaminophen (NORCO/VICODIN) 5-325 MG tablet Take 1 tablet by mouth 2 (two) times daily. For Pain 60 tablet 0  . loperamide (IMODIUM A-D) 2 MG tablet Take 4 mg by mouth See admin instructions. Take 2 tablets ( 4 mg) by mouth after 1st loose stool as needed for diarrhea    . metoprolol tartrate (LOPRESSOR) 25 MG tablet Give 1 tablet by mouth at bedtime on Mon Wed ,Fri    . metoprolol tartrate (LOPRESSOR) 25 MG tablet Give 1 tablet by mouth twice daily on Tues ,Thur ,Sat ,Sun    . midodrine (PROAMATINE) 10 MG tablet Take one tablet (10 mg) by mouth on Monday, Wednesday, Friday 30 minutes prior to dialysis.    Marland Kitchen multivitamin (RENA-VIT) TABS tablet Take 1 tablet by mouth at bedtime.    . NON FORMULARY DIET :Regular NAS  Diet with Double meat Portion,1200 ml fluid restriction. Diet liberalized to promote PO Intake.    . Nutritional Supplements (NEPRO PO) Take 1 Can by mouth 2 (two) times daily.     . pantoprazole (PROTONIX) 40 MG tablet Take 1 tablet (40 mg total) by mouth daily.  0  . sevelamer carbonate (RENVELA) 800 MG tablet Take 1,600 mg by mouth 3 (three) times daily with meals.    . warfarin (COUMADIN) 4 MG tablet Take 4 mg by mouth daily.       REVIEW OF SYSTEMS:  Reviewed in the history and physical with nothing to add  PHYSICAL EXAM: Vitals:   10/09/20 1220 10/09/20 1411 10/09/20 1642  BP: (!) 150/62 (!) 164/68 117/64  Pulse: 67 66 65  Resp: 14 20 17   Temp: 97.7 F (36.5 C)    TempSrc: Oral    SpO2: 100% 100% 99%    GENERAL: The patient is a well-nourished male, in no acute distress. The vital signs are documented above. CARDIOVASCULAR: Left upper arm fistula with superficial location.  No ulceration or wounds over the fistula.  Easily palpable thrill in the fistula. PULMONARY: There is good air exchange  MUSCULOSKELETAL: There are no major deformities or cyanosis. NEUROLOGIC: No focal weakness or paresthesias are detected. SKIN: There are no ulcers or rashes noted. PSYCHIATRIC: The patient has a normal affect.  DATA:  None  MEDICAL ISSUES: Unclear as to the issue regarding the fistula.  Physical exam and has an excellent thrill with a very superficial course and no concern for access.  If the patient is having difficulty with poor access flow or high pressures, would recommend outpatient fistulogram.  Stable for discharge    Rosetta Posner, MD [facility]  Office Tel [phone]

## 2020-10-17 ENCOUNTER — Encounter: Payer: Self-pay | Admitting: Family

## 2020-10-17 ENCOUNTER — Non-Acute Institutional Stay (SKILLED_NURSING_FACILITY): Payer: Medicare Other | Admitting: Family

## 2020-10-17 DIAGNOSIS — D631 Anemia in chronic kidney disease: Secondary | ICD-10-CM

## 2020-10-17 DIAGNOSIS — I132 Hypertensive heart and chronic kidney disease with heart failure and with stage 5 chronic kidney disease, or end stage renal disease: Secondary | ICD-10-CM

## 2020-10-17 DIAGNOSIS — I480 Paroxysmal atrial fibrillation: Secondary | ICD-10-CM

## 2020-10-17 DIAGNOSIS — Z992 Dependence on renal dialysis: Secondary | ICD-10-CM

## 2020-10-17 DIAGNOSIS — E1169 Type 2 diabetes mellitus with other specified complication: Secondary | ICD-10-CM | POA: Diagnosis not present

## 2020-10-17 DIAGNOSIS — N186 End stage renal disease: Secondary | ICD-10-CM

## 2020-10-17 DIAGNOSIS — I5032 Chronic diastolic (congestive) heart failure: Secondary | ICD-10-CM | POA: Diagnosis not present

## 2020-10-17 DIAGNOSIS — E785 Hyperlipidemia, unspecified: Secondary | ICD-10-CM

## 2020-10-17 NOTE — Progress Notes (Signed)
Location:    [employer].   Nursing Home Room Number: 414-D Place of Service:  SNF (31) Provider:  Marlowe Sax, NP    Patient Care Team: Gayland Curry, DO as PCP - General (Geriatric Medicine) [name], MD as Consulting Physician (Nephrology) Rehab, [facility], [facility]  Extended Emergency Contact Information Primary Emergency Contact: [name],[name] Address: [place], [place] [address] [name] of [place] Phone: [phone] Work Phone: [phone] Mobile Phone: [phone] Relation: Daughter Secondary Emergency Contact: [name],Jackie Address: [address]          [place], [place] [address] [name] of [place] Phone: [phone] Mobile Phone: [phone] Relation: Other  Code Status:  DNR Goals of care: Advanced Directive information Advanced Directives 10/17/2020  Does Patient Have a Medical Advance Directive? Yes  Type of Paramedic of [place];Out of facility DNR (pink MOST or yellow form)  Does patient want to make changes to medical advance directive? No - Patient declined  Copy of [place] in Chart? Yes - validated most recent copy scanned in chart (See row information)  Would patient like information on creating a medical advance directive? -  Pre-existing out of facility DNR order (yellow form or pink MOST form) -     Chief Complaint  Patient presents with  . Medical Management of Chronic Issues    Routine Visit.  Marland Kitchen Health Maintenance    Discuss the need for Hepatitis C Screening, and Hemoglobin A1C.  . Immunizations    Discuss the need for Influenza Vaccine, PNA Vaccine, and Tetanus Vaccine.    HPI:  Pt is a 79 y.o. male seen today for medical management of chronic diseases. He is seen in his room today sitting on his wheelchair.he denies any acute issues.He has a medical history of ESRD on  hemodialysis three times per week,CHF, Afib among other condition. Had previous weight loss but weight has been stable over this one month.on Nepro twice daily . His blood pressure readings are in the 130's/50's-140's/50's. No hypotension reported.  Influenza vaccine will be administered at the facility if desired per facility protocol.    Past Medical History:  Diagnosis Date  . A-fib ([place])   . Anemia   . Aortic stenosis   . Blood transfusion   . BPH (benign prostatic hyperplasia)   . CHF (congestive heart failure) ([place])   . Diarrhea   . DM (diabetes mellitus) ([place])    Type II-   not on medication   . ESRD on hemodialysis ([place])    Started dialysis in 2009  . History of GI bleed    secondary to coumadin  . HTN (hypertension)   . Hyperlipidemia   . OSA (obstructive sleep apnea)    uses CPAP  . Sacral wound    stage II - healed, has a protective dressing on- 12/28/2019  . Secondary hyperparathyroidism of renal origin [facility])    Past Surgical History:  Procedure Laterality Date  . ABDOMINAL AORTOGRAM N/A 11/15/2018   Procedure: ABDOMINAL AORTOGRAM;  Surgeon: [name], MD;  Location: [place] [facility];  Service: Cardiovascular;  Laterality: N/A;  . AMPUTATION Left 12/06/2018   Procedure: AMPUTATION DIGIT LEFT FIFTH TOE;  Surgeon: Angelia Mould, MD;  Location: [place];  Service: Vascular;  Laterality: Left;  . AMPUTATION Bilateral 12/29/2018   Procedure: AMPUTATION ABOVE KNEE;  Surgeon: Donzetta Matters,  Georgia Dom, MD;  Location: [place];  Service: Vascular;  Laterality: Bilateral;  . APPLICATION OF WOUND VAC Right 02/17/2019   Procedure: Application Of Wound Vac;  Surgeon: Marty Heck, MD;  Location: [place];  Service: Vascular;  Laterality: Right;  . BVT  4/74/25   Left  Basilic Vein Transposition  . CHOLECYSTECTOMY    . EYE SURGERY     Catarct bil  . I & D EXTREMITY Right 02/17/2019   Procedure: Right above the kneee debridement;  Surgeon: Marty Heck,  MD;  Location: [place];  Service: Vascular;  Laterality: Right;  . INSERTION OF DIALYSIS CATHETER  05/28/2012   Procedure: INSERTION OF DIALYSIS CATHETER;  Surgeon: Mal Misty, MD;  Location: [place];  Service: Vascular;  Laterality: Right;  . INSERTION OF DIALYSIS CATHETER Right 01/02/2020   Procedure: INSERTION OF DIALYSIS CATHETER RIGHT INTERNAL JUGULAR;  Surgeon: Elam Dutch, MD;  Location: [place];  Service: Vascular;  Laterality: Right;  . INSERTION OF DIALYSIS CATHETER Right 01/03/2020   Procedure: INSERTION OF DIALYSIS CATHETER RIGHT INTERNAL JUGULAR;  Surgeon: Rosetta Posner, MD;  Location: [place];  Service: Vascular;  Laterality: Right;  . Left arm shuntogram.    . Left forearm loop graft with 6 mm Gore-Tex graft.    . LOWER EXTREMITY ANGIOGRAPHY Bilateral 11/15/2018   Procedure: LOWER EXTREMITY ANGIOGRAPHY;  Surgeon: [name], MD;  Location: [place] [facility];  Service: Cardiovascular;  Laterality: Bilateral;  . Pars plana vitrectomy with 25-gauge system    . PERIPHERAL VASCULAR ATHERECTOMY Left 11/15/2018   Procedure: PERIPHERAL VASCULAR ATHERECTOMY;  Surgeon: [name], MD;  Location: [place] [facility];  Service: Cardiovascular;  Laterality: Left;  SFA with STENT  . PERIPHERAL VASCULAR BALLOON ANGIOPLASTY Left 11/15/2018   Procedure: PERIPHERAL VASCULAR BALLOON ANGIOPLASTY;  Surgeon: [name], MD;  Location: [place] [facility];  Service: Cardiovascular;  Laterality: Left;  PT  . REVISON OF ARTERIOVENOUS FISTULA Left 01/02/2020   Procedure: REVISON OF LEFT ARM ARTERIOVENOUS FISTULA;  Surgeon: Elam Dutch, MD;  Location: [place];  Service: Vascular;  Laterality: Left;    Allergies  Allergen Reactions  . Occlusive Silicone Sheets [Silicone] Other (See Comments)    "Allergic," per MAR  . Other Other (See Comments)    Unknown reaction to Occlusive adhesive- "Allergic," per MAR  . Sulfa Antibiotics   . Tape Itching and Other (See Comments)    Use Cloth tape  only, please Use Cloth tape only, please    Allergies as of 10/17/2020      Reactions   Occlusive Silicone Sheets [silicone] Other (See Comments)   "Allergic," per [facility]   Other Other (See Comments)   Unknown reaction to Occlusive adhesive- "Allergic," per MAR   Sulfa Antibiotics    Tape Itching, Other (See Comments)   Use Cloth tape only, please Use Cloth tape only, please      Medication List       Accurate as of October 17, 2020 12:26 PM. If you have any questions, ask your nurse or doctor.        acetaminophen 500 MG tablet Commonly known as: TYLENOL Take 1,000 mg by mouth 2 (two) times daily.   atorvastatin 10 MG tablet Commonly known as: LIPITOR Take 10 mg by mouth daily.   dicyclomine 20 MG tablet Commonly known as: BENTYL Take 20 mg by mouth 3 (three) times daily before meals.   divalproex 250 MG DR tablet Commonly  known as: DEPAKOTE Take 250 mg by mouth daily.   feeding supplement (PRO-STAT SUGAR FREE 64) Liqd Take 30 mLs by mouth 2 (two) times daily. 9a and 5p   Hectorol 4 MCG/2ML injection Generic drug: doxercalciferol Inject 2 mcg into the vein every Monday, Wednesday, and Friday with hemodialysis.   HYDROcodone-acetaminophen 5-325 MG tablet Commonly known as: NORCO/VICODIN Take 1 tablet by mouth 2 (two) times daily. For Pain   loperamide 2 MG tablet Commonly known as: IMODIUM A-D Take 4 mg by mouth See admin instructions. Take 2 tablets ( 4 mg) by mouth after 1st loose stool as needed for diarrhea   metoprolol tartrate 25 MG tablet Commonly known as: LOPRESSOR Give 1 tablet by mouth at bedtime on Mon Wed ,Fri   metoprolol tartrate 25 MG tablet Commonly known as: LOPRESSOR Give 1 tablet by mouth twice daily on Tues ,Thur ,Sat ,Sun   midodrine 10 MG tablet Commonly known as: PROAMATINE Take one tablet (10 mg) by mouth on Monday, Wednesday, Friday 30 minutes prior to dialysis.   multivitamin Tabs tablet Take 1 tablet by mouth at bedtime.     NEPRO PO Take 1 Can by mouth 2 (two) times daily.   NON FORMULARY DIET :Regular NAS Diet with Double meat Portion,1200 ml fluid restriction. Diet liberalized to promote PO Intake.   pantoprazole 40 MG tablet Commonly known as: PROTONIX Take 1 tablet (40 mg total) by mouth daily.   sevelamer carbonate 800 MG tablet Commonly known as: RENVELA Take 1,600 mg by mouth 3 (three) times daily with meals.   Vitamin D3 1.25 MG (50000 UT) Caps Take 50,000 Units by mouth every Friday.   warfarin 4 MG tablet Commonly known as: COUMADIN Take 4 mg by mouth daily.       Review of Systems  Constitutional: Negative for appetite change, chills, fatigue, fever and unexpected weight change.  HENT: Negative for congestion, rhinorrhea, sinus pressure, sinus pain, sneezing, sore throat and trouble swallowing.   Eyes: Negative for discharge, redness and itching.  Respiratory: Negative for cough, chest tightness, shortness of breath and wheezing.   Cardiovascular: Negative for chest pain, palpitations and leg swelling.  Gastrointestinal: Negative for abdominal distention, abdominal pain, constipation, diarrhea, nausea and vomiting.  Endocrine: Negative for cold intolerance, heat intolerance, polydipsia, polyphagia and polyuria.  Genitourinary:       On Hemodialysis M-W-F   Musculoskeletal: Positive for gait problem. Negative for joint swelling, myalgias and neck pain.  Skin: Negative for color change, pallor and rash.  Neurological: Negative for dizziness, speech difficulty, weakness, light-headedness and headaches.  Hematological: Does not bruise/bleed easily.  Psychiatric/Behavioral: Negative for agitation, behavioral problems and sleep disturbance. The patient is not nervous/anxious.     Immunization History  Administered Date(s) Administered  . Influenza-Unspecified 08/29/2019  . Moderna SARS-COVID-2 Vaccination 12/28/2019, 01/25/2020  . Pneumococcal Polysaccharide-23 10/07/2019    Pertinent  Health Maintenance Due  Topic Date Due  . HEMOGLOBIN A1C  07/19/2020  . INFLUENZA VACCINE  07/28/2020  . PNA vac Low Risk Adult (2 of 2 - PCV13) 10/06/2020  . OPHTHALMOLOGY EXAM  12/18/2020  . FOOT EXAM  Discontinued  . URINE MICROALBUMIN  Discontinued   No flowsheet data found. Functional Status Survey:    Vitals:   10/17/20 1214  BP: (!) 138/58  Pulse: 68  Resp: 18  Temp: (!) 97.5 F (36.4 C)  Weight: 126 lb (57.2 kg)  Height: 3' (0.914 m)   Body mass index is 68.35 kg/m. Physical Exam Vitals and  nursing note reviewed.  Constitutional:      General: He is not in acute distress.    Appearance: He is not ill-appearing.  HENT:     Head: Normocephalic.     Nose: Nose normal. No congestion or rhinorrhea.     Mouth/Throat:     Mouth: Mucous membranes are moist.     Pharynx: Oropharynx is clear. No oropharyngeal exudate.  Eyes:     General: No scleral icterus.       Right eye: No discharge.        Left eye: No discharge.     Conjunctiva/sclera: Conjunctivae normal.     Pupils: Pupils are equal, round, and reactive to light.  Neck:     Vascular: No carotid bruit.  Cardiovascular:     Rate and Rhythm: Normal rate and regular rhythm.     Heart sounds: Murmur heard.  No friction rub. No gallop.      Comments: Bilateral AKA  Pulmonary:     Effort: Pulmonary effort is normal. No respiratory distress.     Breath sounds: Normal breath sounds. No wheezing, rhonchi or rales.  Chest:     Chest wall: No tenderness.  Abdominal:     General: Bowel sounds are normal. There is no distension.     Palpations: Abdomen is soft. There is no mass.     Tenderness: There is no abdominal tenderness. There is no right CVA tenderness, left CVA tenderness, guarding or rebound.  Musculoskeletal:     Cervical back: Normal range of motion. No rigidity or tenderness.  Lymphadenopathy:     Cervical: No cervical adenopathy.  Neurological:     Mental Status: He is alert.   Psychiatric:        Mood and Affect: Mood normal.        Speech: Speech normal.        Behavior: Behavior normal.        Thought Content: Thought content normal.        Judgment: Judgment normal.    Labs reviewed: Recent Labs    01/03/20 1106 01/03/20 1106 07/03/20 2040 07/05/20 0000 10/09/20 1751  NA 138   < > 142 145 137  K 3.7   < > 3.7 4.2 4.9  CL 101   < > 96* 99 94*  CO2  --   --  31 29* 23  GLUCOSE 102*  --  118*  --  97  BUN 38*   < > 51* 49* 95*  CREATININE 5.90*   < > 5.17* 4.2* 8.21*  CALCIUM  --   --  9.5 10.0 9.7   < > = values in this interval not displayed.   Recent Labs    07/05/20 0000  AST 23  ALT 20  ALBUMIN 4.2   Recent Labs    07/03/20 2040 07/05/20 0000 10/09/20 1751  WBC 6.9 6.5 7.9  NEUTROABS 4.2  --   --   HGB 11.2* 10.9* 12.1*  HCT 35.1* 33* 37.6*  MCV 103.5*  --  100.5*  PLT 133* 135* 120*   Lab Results  Component Value Date   TSH 1.839 05/25/2019   Lab Results  Component Value Date   HGBA1C 3.9 01/20/2020   Lab Results  Component Value Date   CHOL 107 09/11/2020   HDL 37 09/11/2020   LDLCALC 43 09/11/2020   TRIG 135 09/11/2020   CHOLHDL 2.9 11/11/2018    Significant Diagnostic Results in last 30 days:  No results  found.  Assessment/Plan 1. 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 - continue on metoprolol    2. Hyperlipidemia associated with type 2 diabetes mellitus ([place]) Latest LDL at goal. - continue on Lipitor 10 mg tablet daily. - continue on heart Healthy diet.   3. PAF (paroxysmal atrial fibrillation) (HCC) HR controlled.  Continue on Metoprolol and Warfarin   4. Chronic diastolic congestive heart failure (HCC) No signs of fluid overload. Continue on dialysis   5. Anemia due to end stage renal disease ([place]) Latest Hgb has improved.   Family/ staff Communication: Reviewed with  patient and facility Nurse   Labs/tests ordered:None

## 2020-10-25 ENCOUNTER — Other Ambulatory Visit: Payer: Self-pay | Admitting: Adult Health

## 2020-10-25 MED ORDER — HYDROCODONE-ACETAMINOPHEN 5-325 MG PO TABS: 1.0000 | ORAL_TABLET | Freq: Two times a day (BID) | ORAL | 0 refills | Status: AC

## 2020-10-31 ENCOUNTER — Emergency Department (HOSPITAL_COMMUNITY)
Admission: EM | Admit: 2020-10-31 | Discharge: 2020-11-01 | Disposition: A | Discharge status: Home / Self Care | Payer: Medicare Other | Attending: Emergency Medicine | Admitting: Emergency Medicine

## 2020-10-31 DIAGNOSIS — Z992 Dependence on renal dialysis: Secondary | ICD-10-CM | POA: Insufficient documentation

## 2020-10-31 DIAGNOSIS — Z87891 Personal history of nicotine dependence: Secondary | ICD-10-CM | POA: Diagnosis not present

## 2020-10-31 DIAGNOSIS — N186 End stage renal disease: Secondary | ICD-10-CM | POA: Insufficient documentation

## 2020-10-31 DIAGNOSIS — T50901A Poisoning by unspecified drugs, medicaments and biological substances, accidental (unintentional), initial encounter: Secondary | ICD-10-CM | POA: Diagnosis not present

## 2020-10-31 DIAGNOSIS — I5033 Acute on chronic diastolic (congestive) heart failure: Secondary | ICD-10-CM | POA: Insufficient documentation

## 2020-10-31 DIAGNOSIS — I132 Hypertensive heart and chronic kidney disease with heart failure and with stage 5 chronic kidney disease, or end stage renal disease: Secondary | ICD-10-CM | POA: Insufficient documentation

## 2020-10-31 DIAGNOSIS — Z7901 Long term (current) use of anticoagulants: Secondary | ICD-10-CM | POA: Diagnosis not present

## 2020-10-31 DIAGNOSIS — E1152 Type 2 diabetes mellitus with diabetic peripheral angiopathy with gangrene: Secondary | ICD-10-CM | POA: Diagnosis not present

## 2020-10-31 DIAGNOSIS — E1122 Type 2 diabetes mellitus with diabetic chronic kidney disease: Secondary | ICD-10-CM | POA: Diagnosis not present

## 2020-10-31 DIAGNOSIS — Z79899 Other long term (current) drug therapy: Secondary | ICD-10-CM | POA: Insufficient documentation

## 2020-10-31 DIAGNOSIS — R4182 Altered mental status, unspecified: Secondary | ICD-10-CM | POA: Diagnosis not present

## 2020-10-31 NOTE — ED Triage Notes (Signed)
Pt arrives via [facility] from [facility] c/o altered mental status. Staff reported pt was LSW @ 0800, but was able to take 1600 medications? Pt was given hydrocodone at that. Pt was found unresponsive @ approximately 2300. EMS reports pt was unresponsive, apneic for up to 20 seconds, O2 in the 80% range on RA. EMS administered 1 mg Narcan. Pt A+Ox4 on arrival to room.   Staff reported pt missed dialysis Wednesday.   Last EMS VS - 142/70, HR 72, 96% on RA, CBG 217

## 2020-11-01 DIAGNOSIS — R4182 Altered mental status, unspecified: Secondary | ICD-10-CM | POA: Diagnosis not present

## 2020-11-01 LAB — CBC WITH DIFFERENTIAL/PLATELET
Abs Immature Granulocytes: 0.04 10*3/uL (ref 0.00–0.07)
Basophils Absolute: 0.1 10*3/uL (ref 0.0–0.1)
Basophils Relative: 1 %
Eosinophils Absolute: 0.5 10*3/uL (ref 0.0–0.5)
Eosinophils Relative: 5 %
HCT: 32.3 % — ABNORMAL LOW (ref 39.0–52.0)
Hemoglobin: 10.2 g/dL — ABNORMAL LOW (ref 13.0–17.0)
Immature Granulocytes: 1 %
Lymphocytes Relative: 20 %
Lymphs Abs: 1.7 10*3/uL (ref 0.7–4.0)
MCH: 33.3 pg (ref 26.0–34.0)
MCHC: 31.6 g/dL (ref 30.0–36.0)
MCV: 105.6 fL — ABNORMAL HIGH (ref 80.0–100.0)
Monocytes Absolute: 1.1 10*3/uL — ABNORMAL HIGH (ref 0.1–1.0)
Monocytes Relative: 13 %
Neutro Abs: 5.2 10*3/uL (ref 1.7–7.7)
Neutrophils Relative %: 60 %
Platelets: 130 10*3/uL — ABNORMAL LOW (ref 150–400)
RBC: 3.06 MIL/uL — ABNORMAL LOW (ref 4.22–5.81)
RDW: 16.5 % — ABNORMAL HIGH (ref 11.5–15.5)
WBC: 8.5 10*3/uL (ref 4.0–10.5)
nRBC: 0 % (ref 0.0–0.2)

## 2020-11-01 LAB — COMPREHENSIVE METABOLIC PANEL
ALT: 25 U/L (ref 0–44)
AST: 24 U/L (ref 15–41)
Albumin: 3.4 g/dL — ABNORMAL LOW (ref 3.5–5.0)
Alkaline Phosphatase: 261 U/L — ABNORMAL HIGH (ref 38–126)
Anion gap: 19 — ABNORMAL HIGH (ref 5–15)
BUN: 77 mg/dL — ABNORMAL HIGH (ref 8–23)
CO2: 28 mmol/L (ref 22–32)
Calcium: 9.6 mg/dL (ref 8.9–10.3)
Chloride: 94 mmol/L — ABNORMAL LOW (ref 98–111)
Creatinine, Ser: 6.12 mg/dL — ABNORMAL HIGH (ref 0.61–1.24)
GFR, Estimated: 9 mL/min — ABNORMAL LOW (ref >60)
Glucose, Bld: 118 mg/dL — ABNORMAL HIGH (ref 70–99)
Potassium: 4.8 mmol/L (ref 3.5–5.1)
Sodium: 141 mmol/L (ref 135–145)
Total Bilirubin: 0.4 mg/dL (ref 0.3–1.2)
Total Protein: 7.2 g/dL (ref 6.5–8.1)

## 2020-11-01 LAB — ACETAMINOPHEN LEVEL: Acetaminophen (Tylenol), Serum: <10 ug/mL — ABNORMAL LOW (ref 10–30)

## 2020-11-01 LAB — ETHANOL: Alcohol, Ethyl (B): <10 mg/dL (ref <10)

## 2020-11-01 LAB — SALICYLATE LEVEL: Salicylate Lvl: <7 mg/dL — ABNORMAL LOW (ref 7.0–30.0)

## 2020-11-01 LAB — CBG MONITORING, ED: Glucose-Capillary: 101 mg/dL — ABNORMAL HIGH (ref 70–99)

## 2020-11-01 NOTE — ED Notes (Signed)
[facility] here to transport patient back to [facility].

## 2020-11-01 NOTE — ED Provider Notes (Signed)
[facility] EMERGENCY DEPARTMENT Provider Note   CSN: [id number] Arrival date & time: 10/31/20  2332     History Chief Complaint  Patient presents with  . Altered Mental Status  . Drug Overdose    [name] is a 79 y.o. male.  Patient not able to offer much history but per nursing:  Pt arrives via [facility] from [facility] c/o altered mental status. Staff reported pt was LSW @ 0800, but was able to take 1600 medications? Pt was given hydrocodone at that. Pt was found unresponsive @ approximately 2300. EMS reports pt was unresponsive, apneic for up to 20 seconds, O2 in the 80% range on RA. EMS administered 1 mg Narcan. Pt A+Ox4 on arrival to room.   Staff reported pt missed dialysis Wednesday.   Last EMS VS - 142/70, HR 72, 96% on RA, CBG 217    At this time he has no complaints and isn't entirely sure why he is here.    Altered Mental Status Drug Overdose       Past Medical History:  Diagnosis Date  . A-fib ([place])   . Anemia   . Aortic stenosis   . Blood transfusion   . BPH (benign prostatic hyperplasia)   . CHF (congestive heart failure) ([place])   . Diarrhea   . DM (diabetes mellitus) ([place])    Type II-   not on medication   . ESRD on hemodialysis ([place])    Started dialysis in 2009  . History of GI bleed    secondary to coumadin  . HTN (hypertension)   . Hyperlipidemia   . OSA (obstructive sleep apnea)    uses CPAP  . Sacral wound    stage II - healed, has a protective dressing on- 12/28/2019  . Secondary hyperparathyroidism of renal origin [facility])     Patient Active Problem List   Diagnosis Date Noted  . Anticoagulated on Coumadin 12/24/2019  . Infection due to ESBL-producing Escherichia coli 08/12/2019  . MRSA cellulitis 08/12/2019  . Sacral decubitus ulcer, stage IV ([place]) 08/05/2019  . Infected decubitus ulcer, stage IV ([place]) 08/05/2019  . Hypertensive heart and kidney disease with chronic diastolic congestive heart failure and  stage 5 chronic kidney disease on chronic dialysis ([place]) 03/24/2019  . 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 ([place]) 03/24/2019  . Anemia due to end stage renal disease ([place]) 03/24/2019  . Protein-calorie malnutrition, severe ([place]) 03/24/2019  . Hyperlipidemia associated with type 2 diabetes mellitus ([place]) 02/22/2019  . Amputation stump infection ([place]) 02/15/2019  . Conjunctivitis of left eye 01/04/2019  . 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 Open wound of left foot   . Encounter for monitoring Coumadin therapy   . OSA (obstructive sleep apnea)   . ESRD on hemodialysis ([place])   . BPH (benign prostatic hyperplasia)   . Hypotension 12/07/2018  . GERD (gastroesophageal reflux disease) 12/07/2018  . Pressure injury of skin 12/03/2018  . Symptomatic anemia 12/02/2018  . Acute on chronic blood loss anemia 12/02/2018  . 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 Lower GI bleeding 12/02/2018  . Heme positive stool   . Acute on chronic diastolic (congestive) heart failure ([place]) 11/19/2018  . Acute CVA (cerebrovascular accident) ([place]) 11/19/2018  . Hypoglycemia 11/19/2018  . Peripheral vascular disease due to secondary diabetes ([place]) 11/19/2018  . Dry gangrene ([place]) 11/19/2018  .  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 DNR (do not resuscitate) discussion   . Goals of care, counseling/discussion   . Palliative care by specialist   . Acute hypoxemic respiratory failure ([place]) 11/06/2018  . Volume overload 11/06/2018  . Multifocal pneumonia 11/06/2018  . Acute metabolic encephalopathy 99/24/2683  . Physical deconditioning 11/06/2018  . Acute respiratory failure with hypoxia and hypercapnia ([place]) 11/04/2018  . CAP (community acquired pneumonia) 11/04/2018  . Pleural effusion, right 11/04/2018  . Increased ammonia level 11/04/2018  . Abnormal CT of  the abdomen 11/04/2018  . Atrial fibrillation with RVR ([place]) 11/04/2018  . Sepsis ([place]) 10/21/2018  . Acute encephalopathy 10/21/2018  . Elevated alkaline phosphatase level 10/21/2018  . Chronic anemia 10/21/2018  . Thrombocytopenia ([place]) 10/21/2018  . Pulmonary HTN ([place]) 03/04/2017  . Aortic valve stenosis 03/04/2017  . Hypertension, accelerated with heart disease, without CHF 01/12/2017  . Dyspnea on exertion 01/12/2017  . ESRD (end stage renal disease) ([place]) 06/07/2012  . Hyperlipidemia 04/24/2011  . HYPERTENSION, BENIGN 10/24/2009  . Type 2 diabetes mellitus with hypertension and end stage renal disease on dialysis ([place]) 10/23/2009  . OBSTRUCTIVE SLEEP APNEA 10/23/2009  . PAF (paroxysmal atrial fibrillation) ([place]) 10/23/2009  . CHF (congestive heart failure) ([place]) 10/23/2009    Past Surgical History:  Procedure Laterality Date  . ABDOMINAL AORTOGRAM N/A 11/15/2018   Procedure: ABDOMINAL AORTOGRAM;  Surgeon: [name], MD;  Location: [facility];  Service: Cardiovascular;  Laterality: N/A;  . AMPUTATION Left 12/06/2018   Procedure: AMPUTATION DIGIT LEFT FIFTH TOE;  Surgeon: Angelia Mould, MD;  Location: [facility];  Service: Vascular;  Laterality: Left;  . AMPUTATION Bilateral 12/29/2018   Procedure: AMPUTATION ABOVE KNEE;  Surgeon: Waynetta Sandy, MD;  Location: [facility];  Service: Vascular;  Laterality: Bilateral;  . APPLICATION OF WOUND VAC Right 02/17/2019   Procedure: Application Of Wound Vac;  Surgeon: Marty Heck, MD;  Location: [facility];  Service: Vascular;  Laterality: Right;  . BVT  04/15/61   Left  Basilic Vein Transposition  . CHOLECYSTECTOMY    . EYE SURGERY     Catarct bil  . I & D EXTREMITY Right 02/17/2019   Procedure: Right above the kneee debridement;  Surgeon: Marty Heck, MD;  Location: [facility];  Service: Vascular;  Laterality: Right;  . INSERTION OF DIALYSIS CATHETER  05/28/2012   Procedure: INSERTION OF DIALYSIS CATHETER;   Surgeon: Mal Misty, MD;  Location: [facility];  Service: Vascular;  Laterality: Right;  . INSERTION OF DIALYSIS CATHETER Right 01/02/2020   Procedure: INSERTION OF DIALYSIS CATHETER RIGHT INTERNAL JUGULAR;  Surgeon: Elam Dutch, MD;  Location: [facility];  Service: Vascular;  Laterality: Right;  . INSERTION OF DIALYSIS CATHETER Right 01/03/2020   Procedure: INSERTION OF DIALYSIS CATHETER RIGHT INTERNAL JUGULAR;  Surgeon: Rosetta Posner, MD;  Location: [facility];  Service: Vascular;  Laterality: Right;  . Left arm shuntogram.    . Left forearm loop graft with 6 mm Gore-Tex graft.    . LOWER EXTREMITY ANGIOGRAPHY Bilateral 11/15/2018   Procedure: LOWER EXTREMITY ANGIOGRAPHY;  Surgeon: [name], MD;  Location: [facility];  Service: Cardiovascular;  Laterality: Bilateral;  . Pars plana vitrectomy with 25-gauge system    . PERIPHERAL VASCULAR ATHERECTOMY Left 11/15/2018   Procedure: PERIPHERAL VASCULAR ATHERECTOMY;  Surgeon: [name], MD;  Location: [facility];  Service: Cardiovascular;  Laterality: Left;  SFA with STENT  . PERIPHERAL  VASCULAR BALLOON ANGIOPLASTY Left 11/15/2018   Procedure: PERIPHERAL VASCULAR BALLOON ANGIOPLASTY;  Surgeon: [name], MD;  Location: [facility];  Service: Cardiovascular;  Laterality: Left;  PT  . REVISON OF ARTERIOVENOUS FISTULA Left 01/02/2020   Procedure: REVISON OF LEFT ARM ARTERIOVENOUS FISTULA;  Surgeon: Elam Dutch, MD;  Location: [facility];  Service: Vascular;  Laterality: Left;       Family History  Problem Relation Age of Onset  . Alzheimer's disease Mother   . Diabetes Father        Amputation:  bilateral legs  . Cancer Daughter        breast cancer  . Diabetes Son   . Heart disease Son        before age 68  . Hypertension Son   . Anesthesia problems Neg Hx     Social History   Tobacco Use  . Smoking status: Former Smoker    Types: Cigarettes    Quit date: 06/30/2004    Years since quitting: 16.3  .  Smokeless tobacco: Never Used  Vaping Use  . Vaping Use: Never used  Substance Use Topics  . Alcohol use: No  . Drug use: No    Home Medications Prior to Admission medications   Medication Sig Start Date End Date Taking? Authorizing Provider  acetaminophen (TYLENOL) 500 MG tablet Take 1,000 mg by mouth 2 (two) times daily.    Yes [provider]  Amino Acids-Protein Hydrolys (FEEDING SUPPLEMENT, PRO-STAT SUGAR FREE 64,) LIQD Take 30 mLs by mouth 2 (two) times daily. 9a and 5p   Yes [provider]  atorvastatin (LIPITOR) 10 MG tablet Take 10 mg by mouth at bedtime.    Yes [provider]  Cholecalciferol (VITAMIN D3) 1.25 MG (50000 UT) CAPS Take 50,000 Units by mouth every Tuesday.    Yes [provider]  dicyclomine (BENTYL) 20 MG tablet Take 20 mg by mouth 3 (three) times daily before meals.   Yes [provider]  divalproex (DEPAKOTE) 250 MG DR tablet Take 250 mg by mouth at bedtime.    Yes [provider]  HYDROcodone-acetaminophen (NORCO/VICODIN) 5-325 MG tablet Take 1 tablet by mouth 2 (two) times daily. For Pain 10/25/20  Yes Medina-Vargas, Monina C, NP  loperamide (IMODIUM A-D) 2 MG tablet Take 4 mg by mouth See admin instructions. Take 2 tablets ( 4 mg) by mouth after 1st loose stool as needed for diarrhea   Yes [provider]  metoprolol tartrate (LOPRESSOR) 25 MG tablet Give 1 tablet by mouth at bedtime on Mon Wed ,Fri   Yes [provider]  midodrine (PROAMATINE) 10 MG tablet Take one tablet (10 mg) by mouth on Monday, Wednesday, Friday 30 minutes prior to dialysis.   Yes [provider]  multivitamin (RENA-VIT) TABS tablet Take 1 tablet by mouth at bedtime.   Yes [provider]  Nutritional Supplements (NEPRO PO) Take 1 Can by mouth 2 (two) times daily.    Yes [provider]  pantoprazole (PROTONIX) 40 MG tablet Take 1 tablet (40 mg total) by mouth daily. 01/01/19  Yes Domenic Polite, MD  sevelamer carbonate (RENVELA) 800 MG tablet Take 1,600 mg by mouth 3 (three) times daily with meals.   Yes [provider]  warfarin (COUMADIN) 4 MG tablet Take 4 mg by mouth daily.    Yes [provider]  doxercalciferol (HECTOROL) 4 MCG/2ML injection Inject 2 mcg into the vein every Monday, Wednesday, and Friday  with hemodialysis.    [provider]  metoprolol tartrate (LOPRESSOR) 25 MG tablet Give 1 tablet by mouth twice daily on Tues ,Thur ,Sat ,Sun    [provider]  NON FORMULARY DIET :Regular NAS Diet with Double meat Portion,1200 ml fluid restriction. Diet liberalized to promote PO Intake.    [provider]    Allergies    Occlusive silicone sheets [silicone], Other, Sulfa antibiotics, and Tape  Review of Systems   Review of Systems  All other systems reviewed and are negative.   Physical Exam Updated Vital Signs BP (!) 153/87   Pulse 92   Temp 97.6 F (36.4 C) (Oral)   Resp 12   Ht 3' (0.914 m)   Wt 71.2 kg   SpO2 98%   BMI 85.17 kg/m   Physical Exam Vitals and nursing note reviewed.  Constitutional:      Appearance: He is well-developed.  HENT:     Head: Normocephalic and atraumatic.     Nose: No congestion or rhinorrhea.     Mouth/Throat:     Mouth: Mucous membranes are moist.     Pharynx: Oropharynx is clear.  Eyes:     Pupils: Pupils are equal, round, and reactive to light.  Cardiovascular:     Rate and Rhythm: Normal rate.  Pulmonary:     Effort: Pulmonary effort is normal. No respiratory distress.  Abdominal:     General: There is no distension.  Musculoskeletal:        General: Normal range of motion.     Cervical back: Normal range of motion.  Skin:    General: Skin is warm and dry.     Coloration: Skin is not jaundiced or pale.  Neurological:     General: No focal deficit present.     Mental Status: He is alert.     ED Results / Procedures / Treatments   Labs (all labs ordered  are listed, but only abnormal results are displayed) Labs Reviewed  COMPREHENSIVE METABOLIC PANEL - Abnormal; Notable for the following components:      Result Value   Chloride 94 (*)    Glucose, Bld 118 (*)    BUN 77 (*)    Creatinine, Ser 6.12 (*)    Albumin 3.4 (*)    Alkaline Phosphatase 261 (*)    GFR, Estimated 9 (*)    Anion gap 19 (*)    All other components within normal limits  SALICYLATE LEVEL - Abnormal; Notable for the following components:   Salicylate Lvl <2.8 (*)    All other components within normal limits  ACETAMINOPHEN LEVEL - Abnormal; Notable for the following components:   Acetaminophen (Tylenol), Serum <10 (*)    All other components within normal limits  CBC WITH DIFFERENTIAL/PLATELET - Abnormal; Notable for the following components:   RBC 3.06 (*)    Hemoglobin 10.2 (*)    HCT 32.3 (*)    MCV 105.6 (*)    RDW 16.5 (*)    Platelets 130 (*)    Monocytes Absolute 1.1 (*)    All other components within normal limits  CBG MONITORING, ED - Abnormal; Notable for the following components:   Glucose-Capillary 101 (*)    All other components within normal limits  ETHANOL  RAPID URINE DRUG SCREEN, HOSP PERFORMED    EKG EKG Interpretation  Date/Time:  Thursday October 31 2020 23:45:02 EDT Ventricular Rate:  90 PR Interval:    QRS Duration: 107 QT Interval:  398  QTC Calculation: 487 R Axis:   21 Text Interpretation: Sinus rhythm Anterior infarct, old Minimal ST elevation, inferior leads similar morphology to july 21 and multiple ECG's previous to that Confirmed by Merrily Pew [phone]) on 11/01/2020 12:25:09 AM   Radiology No results found.  Procedures Procedures (including critical care time)  Medications Ordered in ED Medications - No data to display  ED Course  I have reviewed the triage vital signs and the nursing notes.  Pertinent labs & imaging results that were available during my care of the patient were reviewed by me and considered in  my medical decision making (see chart for details).    MDM Rules/Calculators/A&P                          Observed for >3 hours without recurrent change in mentation. Likely opiate induced in setting of poor kidneys. No indication for emergent dialysis. Stable for dc to faciliity.   Final Clinical Impression(s) / ED Diagnoses Final diagnoses:  Altered mental status, unspecified altered mental status type    Rx / DC Orders ED Discharge Orders    None       Marrietta Thunder, Corene Cornea, MD 11/01/20 [phone]

## 2020-11-11 ENCOUNTER — Encounter: Payer: Self-pay | Admitting: Internal Medicine

## 2020-11-27 DEATH — deceased

## 2021-01-20 ENCOUNTER — Encounter: Payer: Self-pay | Admitting: Internal Medicine
